# Patient Record
Sex: Female | Born: 1955
Health system: Southern US, Community
[De-identification: ages and names within clinical notes are randomized; demographics above are authoritative.]

## PROBLEM LIST (undated history)

## (undated) DIAGNOSIS — T8189XA Other complications of procedures, not elsewhere classified, initial encounter: Secondary | ICD-10-CM

## (undated) DIAGNOSIS — R239 Unspecified skin changes: Secondary | ICD-10-CM

## (undated) DIAGNOSIS — Z9889 Other specified postprocedural states: Secondary | ICD-10-CM

## (undated) DIAGNOSIS — I341 Nonrheumatic mitral (valve) prolapse: Secondary | ICD-10-CM

## (undated) DIAGNOSIS — R011 Cardiac murmur, unspecified: Secondary | ICD-10-CM

## (undated) DIAGNOSIS — G620 Drug-induced polyneuropathy: Secondary | ICD-10-CM

## (undated) DIAGNOSIS — C50911 Malignant neoplasm of unspecified site of right female breast: Secondary | ICD-10-CM

## (undated) DIAGNOSIS — Z853 Personal history of malignant neoplasm of breast: Secondary | ICD-10-CM

## (undated) DIAGNOSIS — Z7901 Long term (current) use of anticoagulants: Secondary | ICD-10-CM

## (undated) DIAGNOSIS — R112 Nausea with vomiting, unspecified: Secondary | ICD-10-CM

## (undated) DIAGNOSIS — T451X5A Adverse effect of antineoplastic and immunosuppressive drugs, initial encounter: Secondary | ICD-10-CM

## (undated) DIAGNOSIS — I749 Embolism and thrombosis of unspecified artery: Secondary | ICD-10-CM

## (undated) DIAGNOSIS — M1990 Unspecified osteoarthritis, unspecified site: Secondary | ICD-10-CM

## (undated) DIAGNOSIS — R768 Other specified abnormal immunological findings in serum: Secondary | ICD-10-CM

## (undated) DIAGNOSIS — Z95828 Presence of other vascular implants and grafts: Secondary | ICD-10-CM

## (undated) DIAGNOSIS — C792 Secondary malignant neoplasm of skin: Secondary | ICD-10-CM

## (undated) DIAGNOSIS — R234 Changes in skin texture: Secondary | ICD-10-CM

## (undated) HISTORY — PX: OTHER SURGICAL HISTORY: SHX169

---

## 2005-12-04 ENCOUNTER — Emergency Department (HOSPITAL_COMMUNITY): Admission: EM | Admit: 2005-12-04 | Discharge: 2005-12-04 | Payer: Self-pay | Admitting: Family Medicine

## 2007-04-13 ENCOUNTER — Encounter: Admission: RE | Admit: 2007-04-13 | Discharge: 2007-04-13 | Payer: Self-pay | Admitting: Internal Medicine

## 2007-05-07 ENCOUNTER — Other Ambulatory Visit: Admission: RE | Admit: 2007-05-07 | Discharge: 2007-05-07 | Payer: Self-pay | Admitting: Obstetrics and Gynecology

## 2007-05-27 LAB — HM MAMMOGRAPHY: HM Mammogram: ABNORMAL

## 2007-06-07 ENCOUNTER — Encounter (INDEPENDENT_AMBULATORY_CARE_PROVIDER_SITE_OTHER): Payer: Self-pay | Admitting: Diagnostic Radiology

## 2007-06-07 ENCOUNTER — Encounter: Admission: RE | Admit: 2007-06-07 | Discharge: 2007-06-07 | Payer: Self-pay | Admitting: Obstetrics and Gynecology

## 2007-06-10 ENCOUNTER — Ambulatory Visit: Payer: Self-pay | Admitting: Oncology

## 2007-06-15 ENCOUNTER — Ambulatory Visit (HOSPITAL_COMMUNITY): Admission: RE | Admit: 2007-06-15 | Discharge: 2007-06-15 | Payer: Self-pay | Admitting: General Surgery

## 2007-06-15 ENCOUNTER — Encounter: Admission: RE | Admit: 2007-06-15 | Discharge: 2007-06-15 | Payer: Self-pay | Admitting: Obstetrics and Gynecology

## 2007-06-16 LAB — CBC WITH DIFFERENTIAL/PLATELET
BASO%: 0.2 % (ref 0.0–2.0)
LYMPH%: 26.6 % (ref 14.0–48.0)
MCHC: 33.7 g/dL (ref 32.0–36.0)
MCV: 91 fL (ref 81.0–101.0)
MONO#: 0.3 10*3/uL (ref 0.1–0.9)
MONO%: 5.9 % (ref 0.0–13.0)
Platelets: 273 10*3/uL (ref 145–400)
RBC: 4.06 10*6/uL (ref 3.70–5.32)
WBC: 5.4 10*3/uL (ref 3.9–10.0)

## 2007-06-16 LAB — CANCER ANTIGEN 27.29: CA 27.29: 30 U/mL (ref 0–39)

## 2007-06-16 LAB — COMPREHENSIVE METABOLIC PANEL
ALT: 8 U/L (ref 0–35)
Alkaline Phosphatase: 39 U/L (ref 39–117)
Sodium: 139 mEq/L (ref 135–145)
Total Bilirubin: 0.6 mg/dL (ref 0.3–1.2)
Total Protein: 6.5 g/dL (ref 6.0–8.3)

## 2007-06-20 LAB — VITAMIN D PNL(25-HYDRXY+1,25-DIHY)-BLD: Vit D, 1,25-Dihydroxy: 55 pg/mL (ref 6–62)

## 2007-06-22 ENCOUNTER — Ambulatory Visit (HOSPITAL_COMMUNITY): Admission: RE | Admit: 2007-06-22 | Discharge: 2007-06-22 | Payer: Self-pay | Admitting: General Surgery

## 2007-06-22 HISTORY — PX: OTHER SURGICAL HISTORY: SHX169

## 2007-06-23 ENCOUNTER — Encounter: Payer: Self-pay | Admitting: Oncology

## 2007-06-23 ENCOUNTER — Ambulatory Visit: Payer: Self-pay

## 2007-06-24 ENCOUNTER — Ambulatory Visit (HOSPITAL_COMMUNITY): Admission: RE | Admit: 2007-06-24 | Discharge: 2007-06-24 | Payer: Self-pay | Admitting: Oncology

## 2007-06-28 LAB — CBC WITH DIFFERENTIAL/PLATELET
Basophils Absolute: 0 10*3/uL (ref 0.0–0.1)
HCT: 38.3 % (ref 34.8–46.6)
HGB: 13.1 g/dL (ref 11.6–15.9)
MONO#: 0.1 10*3/uL (ref 0.1–0.9)
NEUT#: 18.7 10*3/uL — ABNORMAL HIGH (ref 1.5–6.5)
NEUT%: 95.3 % — ABNORMAL HIGH (ref 39.6–76.8)
RDW: 13.3 % (ref 11.3–14.5)
WBC: 19.6 10*3/uL — ABNORMAL HIGH (ref 3.9–10.0)
lymph#: 0.8 10*3/uL — ABNORMAL LOW (ref 0.9–3.3)

## 2007-07-02 LAB — CBC WITH DIFFERENTIAL/PLATELET
Basophils Absolute: 0.3 10*3/uL — ABNORMAL HIGH (ref 0.0–0.1)
EOS%: 0.9 % (ref 0.0–7.0)
Eosinophils Absolute: 0.1 10*3/uL (ref 0.0–0.5)
HCT: 35.3 % (ref 34.8–46.6)
HGB: 12.5 g/dL (ref 11.6–15.9)
MCH: 31 pg (ref 26.0–34.0)
MCV: 87.8 fL (ref 81.0–101.0)
MONO%: 10 % (ref 0.0–13.0)
NEUT#: 10.5 10*3/uL — ABNORMAL HIGH (ref 1.5–6.5)
NEUT%: 74.1 % (ref 39.6–76.8)
lymph#: 1.8 10*3/uL (ref 0.9–3.3)

## 2007-07-09 LAB — COMPREHENSIVE METABOLIC PANEL
AST: 15 U/L (ref 0–37)
Albumin: 3.8 g/dL (ref 3.5–5.2)
Alkaline Phosphatase: 87 U/L (ref 39–117)
BUN: 15 mg/dL (ref 6–23)
Creatinine, Ser: 0.73 mg/dL (ref 0.40–1.20)
Glucose, Bld: 79 mg/dL (ref 70–99)
Potassium: 4.4 mEq/L (ref 3.5–5.3)
Total Bilirubin: 0.5 mg/dL (ref 0.3–1.2)

## 2007-07-09 LAB — CBC WITH DIFFERENTIAL/PLATELET
Basophils Absolute: 0.2 10*3/uL — ABNORMAL HIGH (ref 0.0–0.1)
EOS%: 0.4 % (ref 0.0–7.0)
Eosinophils Absolute: 0 10*3/uL (ref 0.0–0.5)
HGB: 12.1 g/dL (ref 11.6–15.9)
LYMPH%: 15.8 % (ref 14.0–48.0)
MCH: 31.1 pg (ref 26.0–34.0)
MCV: 88.2 fL (ref 81.0–101.0)
MONO%: 6.1 % (ref 0.0–13.0)
NEUT#: 6.5 10*3/uL (ref 1.5–6.5)
Platelets: 198 10*3/uL (ref 145–400)
RDW: 10.9 % — ABNORMAL LOW (ref 11.3–14.5)

## 2007-07-16 LAB — CBC WITH DIFFERENTIAL/PLATELET
BASO%: 0.8 % (ref 0.0–2.0)
LYMPH%: 5.3 % — ABNORMAL LOW (ref 14.0–48.0)
MCHC: 35.2 g/dL (ref 32.0–36.0)
MONO#: 0.2 10*3/uL (ref 0.1–0.9)
RBC: 3.61 10*6/uL — ABNORMAL LOW (ref 3.70–5.32)
lymph#: 0.6 10*3/uL — ABNORMAL LOW (ref 0.9–3.3)

## 2007-07-16 LAB — COMPREHENSIVE METABOLIC PANEL
ALT: 12 U/L (ref 0–35)
AST: 13 U/L (ref 0–37)
Alkaline Phosphatase: 59 U/L (ref 39–117)
Glucose, Bld: 123 mg/dL — ABNORMAL HIGH (ref 70–99)
Sodium: 137 mEq/L (ref 135–145)
Total Bilirubin: 0.7 mg/dL (ref 0.3–1.2)
Total Protein: 6.3 g/dL (ref 6.0–8.3)

## 2007-07-21 ENCOUNTER — Ambulatory Visit: Payer: Self-pay | Admitting: Oncology

## 2007-07-23 LAB — CBC WITH DIFFERENTIAL/PLATELET
Eosinophils Absolute: 0.4 10*3/uL (ref 0.0–0.5)
LYMPH%: 15.6 % (ref 14.0–48.0)
MCH: 31.4 pg (ref 26.0–34.0)
MCV: 89.8 fL (ref 81.0–101.0)
MONO%: 6.6 % (ref 0.0–13.0)
NEUT#: 8.2 10*3/uL — ABNORMAL HIGH (ref 1.5–6.5)
Platelets: 170 10*3/uL (ref 145–400)
RBC: 3.63 10*6/uL — ABNORMAL LOW (ref 3.70–5.32)

## 2007-07-30 LAB — CBC WITH DIFFERENTIAL/PLATELET
BASO%: 2 % (ref 0.0–2.0)
EOS%: 0.1 % (ref 0.0–7.0)
LYMPH%: 20.6 % (ref 14.0–48.0)
MCHC: 34.2 g/dL (ref 32.0–36.0)
MCV: 91.1 fL (ref 81.0–101.0)
MONO%: 4.9 % (ref 0.0–13.0)
Platelets: 172 10*3/uL (ref 145–400)
RBC: 3.54 10*6/uL — ABNORMAL LOW (ref 3.70–5.32)

## 2007-08-06 LAB — CBC WITH DIFFERENTIAL/PLATELET
Eosinophils Absolute: 0 10*3/uL (ref 0.0–0.5)
LYMPH%: 8.6 % — ABNORMAL LOW (ref 14.0–48.0)
MCHC: 34.1 g/dL (ref 32.0–36.0)
MCV: 92 fL (ref 81.0–101.0)
MONO%: 2.5 % (ref 0.0–13.0)
NEUT#: 7.7 10*3/uL — ABNORMAL HIGH (ref 1.5–6.5)
Platelets: 239 10*3/uL (ref 145–400)
RBC: 3.55 10*6/uL — ABNORMAL LOW (ref 3.70–5.32)

## 2007-08-06 LAB — COMPREHENSIVE METABOLIC PANEL
ALT: 15 U/L (ref 0–35)
AST: 16 U/L (ref 0–37)
Albumin: 4.5 g/dL (ref 3.5–5.2)
Alkaline Phosphatase: 71 U/L (ref 39–117)
BUN: 13 mg/dL (ref 6–23)
CO2: 23 mEq/L (ref 19–32)
Calcium: 8.7 mg/dL (ref 8.4–10.5)
Chloride: 101 mEq/L (ref 96–112)
Creatinine, Ser: 0.74 mg/dL (ref 0.40–1.20)
Glucose, Bld: 102 mg/dL — ABNORMAL HIGH (ref 70–99)
Potassium: 3.8 mEq/L (ref 3.5–5.3)
Sodium: 139 mEq/L (ref 135–145)
Total Bilirubin: 1.1 mg/dL (ref 0.3–1.2)
Total Protein: 6.9 g/dL (ref 6.0–8.3)

## 2007-08-13 LAB — CBC WITH DIFFERENTIAL/PLATELET
BASO%: 1.6 % (ref 0.0–2.0)
LYMPH%: 18.2 % (ref 14.0–48.0)
MCHC: 35 g/dL (ref 32.0–36.0)
MONO#: 0.9 10*3/uL (ref 0.1–0.9)
MONO%: 10.2 % (ref 0.0–13.0)
Platelets: 231 10*3/uL (ref 145–400)
RBC: 3.43 10*6/uL — ABNORMAL LOW (ref 3.70–5.32)
WBC: 9 10*3/uL (ref 3.9–10.0)

## 2007-08-13 LAB — URINALYSIS, MICROSCOPIC - CHCC
Glucose: NEGATIVE g/dL
Leukocyte Esterase: NEGATIVE
Nitrite: NEGATIVE

## 2007-08-15 LAB — URINE CULTURE

## 2007-08-24 ENCOUNTER — Ambulatory Visit (HOSPITAL_COMMUNITY): Admission: RE | Admit: 2007-08-24 | Discharge: 2007-08-24 | Payer: Self-pay | Admitting: Oncology

## 2007-08-27 LAB — COMPREHENSIVE METABOLIC PANEL
AST: 15 U/L (ref 0–37)
Albumin: 4.3 g/dL (ref 3.5–5.2)
Alkaline Phosphatase: 66 U/L (ref 39–117)
BUN: 11 mg/dL (ref 6–23)
Creatinine, Ser: 0.78 mg/dL (ref 0.40–1.20)
Glucose, Bld: 125 mg/dL — ABNORMAL HIGH (ref 70–99)

## 2007-08-27 LAB — CBC WITH DIFFERENTIAL/PLATELET
Basophils Absolute: 0 10*3/uL (ref 0.0–0.1)
EOS%: 0.2 % (ref 0.0–7.0)
Eosinophils Absolute: 0 10*3/uL (ref 0.0–0.5)
HCT: 31 % — ABNORMAL LOW (ref 34.8–46.6)
HGB: 10.7 g/dL — ABNORMAL LOW (ref 11.6–15.9)
MCH: 32.2 pg (ref 26.0–34.0)
MCV: 93.4 fL (ref 81.0–101.0)
MONO%: 2.4 % (ref 0.0–13.0)
NEUT#: 7.5 10*3/uL — ABNORMAL HIGH (ref 1.5–6.5)
NEUT%: 88.5 % — ABNORMAL HIGH (ref 39.6–76.8)
RDW: 15.2 % — ABNORMAL HIGH (ref 11.3–14.5)
lymph#: 0.7 10*3/uL — ABNORMAL LOW (ref 0.9–3.3)

## 2007-09-01 ENCOUNTER — Ambulatory Visit: Payer: Self-pay | Admitting: Oncology

## 2007-09-03 LAB — CBC WITH DIFFERENTIAL/PLATELET
Basophils Absolute: 0.2 10*3/uL — ABNORMAL HIGH (ref 0.0–0.1)
EOS%: 1.8 % (ref 0.0–7.0)
Eosinophils Absolute: 0.1 10*3/uL (ref 0.0–0.5)
HGB: 11.7 g/dL (ref 11.6–15.9)
LYMPH%: 20.1 % (ref 14.0–48.0)
MCH: 32.7 pg (ref 26.0–34.0)
MCV: 93.7 fL (ref 81.0–101.0)
MONO%: 11.2 % (ref 0.0–13.0)
NEUT#: 4.9 10*3/uL (ref 1.5–6.5)
Platelets: 200 10*3/uL (ref 145–400)

## 2007-09-10 LAB — CBC WITH DIFFERENTIAL/PLATELET
BASO%: 1.2 % (ref 0.0–2.0)
EOS%: 0.4 % (ref 0.0–7.0)
LYMPH%: 22.9 % (ref 14.0–48.0)
MCH: 32.3 pg (ref 26.0–34.0)
MCHC: 34.3 g/dL (ref 32.0–36.0)
MCV: 94.1 fL (ref 81.0–101.0)
MONO%: 6.5 % (ref 0.0–13.0)
Platelets: 193 10*3/uL (ref 145–400)
RBC: 3.13 10*6/uL — ABNORMAL LOW (ref 3.70–5.32)
RDW: 15.3 % — ABNORMAL HIGH (ref 11.3–14.5)

## 2007-09-15 LAB — CBC WITH DIFFERENTIAL/PLATELET
BASO%: 1.9 % (ref 0.0–2.0)
LYMPH%: 23.2 % (ref 14.0–48.0)
MCHC: 34.2 g/dL (ref 32.0–36.0)
MONO#: 0.6 10*3/uL (ref 0.1–0.9)
Platelets: 153 10*3/uL (ref 145–400)
RBC: 3.32 10*6/uL — ABNORMAL LOW (ref 3.70–5.32)
RDW: 15.8 % — ABNORMAL HIGH (ref 11.3–14.5)
WBC: 6.6 10*3/uL (ref 3.9–10.0)

## 2007-09-17 LAB — COMPREHENSIVE METABOLIC PANEL
ALT: 19 U/L (ref 0–35)
Alkaline Phosphatase: 65 U/L (ref 39–117)
CO2: 27 mEq/L (ref 19–32)
Potassium: 3.6 mEq/L (ref 3.5–5.3)
Sodium: 137 mEq/L (ref 135–145)
Total Bilirubin: 1.5 mg/dL — ABNORMAL HIGH (ref 0.3–1.2)
Total Protein: 6.4 g/dL (ref 6.0–8.3)

## 2007-09-22 ENCOUNTER — Ambulatory Visit: Payer: Self-pay

## 2007-09-22 ENCOUNTER — Encounter: Payer: Self-pay | Admitting: Oncology

## 2007-09-24 LAB — CBC WITH DIFFERENTIAL/PLATELET
BASO%: 2.1 % — ABNORMAL HIGH (ref 0.0–2.0)
Basophils Absolute: 0.1 10e3/uL (ref 0.0–0.1)
EOS%: 1.6 % (ref 0.0–7.0)
Eosinophils Absolute: 0.1 10e3/uL (ref 0.0–0.5)
HCT: 28.9 % — ABNORMAL LOW (ref 34.8–46.6)
HGB: 10.1 g/dL — ABNORMAL LOW (ref 11.6–15.9)
LYMPH%: 22.3 % (ref 14.0–48.0)
MCH: 33.6 pg (ref 26.0–34.0)
MCHC: 35 g/dL (ref 32.0–36.0)
MCV: 95.8 fL (ref 81.0–101.0)
MONO#: 0.6 10e3/uL (ref 0.1–0.9)
MONO%: 10.4 % (ref 0.0–13.0)
NEUT#: 3.5 10e3/uL (ref 1.5–6.5)
NEUT%: 63.6 % (ref 39.6–76.8)
Platelets: 116 10e3/uL — ABNORMAL LOW (ref 145–400)
RBC: 3.01 10e6/uL — ABNORMAL LOW (ref 3.70–5.32)
RDW: 14.5 % (ref 11.3–14.5)
WBC: 5.6 10e3/uL (ref 3.9–10.0)
lymph#: 1.2 10e3/uL (ref 0.9–3.3)

## 2007-09-29 ENCOUNTER — Ambulatory Visit (HOSPITAL_COMMUNITY): Admission: RE | Admit: 2007-09-29 | Discharge: 2007-09-29 | Payer: Self-pay | Admitting: Internal Medicine

## 2007-10-01 LAB — CBC WITH DIFFERENTIAL/PLATELET
BASO%: 1.4 % (ref 0.0–2.0)
Eosinophils Absolute: 0 10*3/uL (ref 0.0–0.5)
HCT: 30.3 % — ABNORMAL LOW (ref 34.8–46.6)
LYMPH%: 24.8 % (ref 14.0–48.0)
MCHC: 33.2 g/dL (ref 32.0–36.0)
MCV: 100 fL (ref 81.0–101.0)
MONO%: 7.3 % (ref 0.0–13.0)
NEUT%: 66.2 % (ref 39.6–76.8)
Platelets: 182 10*3/uL (ref 145–400)
RBC: 3.02 10*6/uL — ABNORMAL LOW (ref 3.70–5.32)

## 2007-10-08 LAB — CBC WITH DIFFERENTIAL/PLATELET
BASO%: 0.2 % (ref 0.0–2.0)
EOS%: 0 % (ref 0.0–7.0)
LYMPH%: 7.1 % — ABNORMAL LOW (ref 14.0–48.0)
MCH: 34.4 pg — ABNORMAL HIGH (ref 26.0–34.0)
MCHC: 33.8 g/dL (ref 32.0–36.0)
MONO#: 0.1 10*3/uL (ref 0.1–0.9)
MONO%: 1.9 % (ref 0.0–13.0)
NEUT%: 90.8 % — ABNORMAL HIGH (ref 39.6–76.8)
Platelets: 224 10*3/uL (ref 145–400)
RBC: 3.06 10*6/uL — ABNORMAL LOW (ref 3.70–5.32)
WBC: 6.6 10*3/uL (ref 3.9–10.0)

## 2007-10-08 LAB — COMPREHENSIVE METABOLIC PANEL
ALT: 16 U/L (ref 0–35)
AST: 18 U/L (ref 0–37)
Alkaline Phosphatase: 62 U/L (ref 39–117)
CO2: 25 mEq/L (ref 19–32)
Creatinine, Ser: 0.72 mg/dL (ref 0.40–1.20)
Sodium: 142 mEq/L (ref 135–145)
Total Bilirubin: 0.8 mg/dL (ref 0.3–1.2)
Total Protein: 6.8 g/dL (ref 6.0–8.3)

## 2007-10-13 ENCOUNTER — Ambulatory Visit: Payer: Self-pay | Admitting: Oncology

## 2007-10-22 LAB — CBC WITH DIFFERENTIAL/PLATELET
BASO%: 1.7 % (ref 0.0–2.0)
HCT: 29.1 % — ABNORMAL LOW (ref 34.8–46.6)
LYMPH%: 25.5 % (ref 14.0–48.0)
MCH: 34.9 pg — ABNORMAL HIGH (ref 26.0–34.0)
MCHC: 34.4 g/dL (ref 32.0–36.0)
MCV: 101 fL (ref 81.0–101.0)
MONO#: 0.5 10*3/uL (ref 0.1–0.9)
MONO%: 8.3 % (ref 0.0–13.0)
NEUT%: 64.3 % (ref 39.6–76.8)
Platelets: 169 10*3/uL (ref 145–400)
RBC: 2.87 10*6/uL — ABNORMAL LOW (ref 3.70–5.32)

## 2007-10-27 ENCOUNTER — Ambulatory Visit (HOSPITAL_COMMUNITY): Admission: RE | Admit: 2007-10-27 | Discharge: 2007-10-27 | Payer: Self-pay | Admitting: Oncology

## 2007-10-29 LAB — CBC WITH DIFFERENTIAL/PLATELET
Basophils Absolute: 0.1 10*3/uL (ref 0.0–0.1)
Eosinophils Absolute: 0.1 10*3/uL (ref 0.0–0.5)
HGB: 11.3 g/dL — ABNORMAL LOW (ref 11.6–15.9)
MONO#: 0.7 10*3/uL (ref 0.1–0.9)
MONO%: 10 % (ref 0.0–13.0)
NEUT#: 4.4 10*3/uL (ref 1.5–6.5)
RBC: 3.23 10*6/uL — ABNORMAL LOW (ref 3.70–5.32)
RDW: 14.6 % — ABNORMAL HIGH (ref 11.3–14.5)
WBC: 6.9 10*3/uL (ref 3.9–10.0)
lymph#: 1.6 10*3/uL (ref 0.9–3.3)

## 2007-11-05 LAB — CBC WITH DIFFERENTIAL/PLATELET
BASO%: 1.4 % (ref 0.0–2.0)
EOS%: 6.3 % (ref 0.0–7.0)
HCT: 31.8 % — ABNORMAL LOW (ref 34.8–46.6)
HGB: 10.9 g/dL — ABNORMAL LOW (ref 11.6–15.9)
MCH: 35.5 pg — ABNORMAL HIGH (ref 26.0–34.0)
MCHC: 34.2 g/dL (ref 32.0–36.0)
NEUT#: 2.7 10*3/uL (ref 1.5–6.5)
NEUT%: 60.1 % (ref 39.6–76.8)
RDW: 13.6 % (ref 11.3–14.5)
WBC: 4.5 10*3/uL (ref 3.9–10.0)
lymph#: 1 10*3/uL (ref 0.9–3.3)

## 2007-11-13 ENCOUNTER — Ambulatory Visit (HOSPITAL_COMMUNITY): Admission: RE | Admit: 2007-11-13 | Discharge: 2007-11-15 | Payer: Self-pay | Admitting: General Surgery

## 2007-11-13 ENCOUNTER — Encounter (INDEPENDENT_AMBULATORY_CARE_PROVIDER_SITE_OTHER): Payer: Self-pay | Admitting: General Surgery

## 2007-11-13 HISTORY — PX: OTHER SURGICAL HISTORY: SHX169

## 2007-11-22 LAB — COMPREHENSIVE METABOLIC PANEL
ALT: 30 U/L (ref 0–35)
AST: 29 U/L (ref 0–37)
Albumin: 3.8 g/dL (ref 3.5–5.2)
Alkaline Phosphatase: 52 U/L (ref 39–117)
Potassium: 4 mEq/L (ref 3.5–5.3)
Sodium: 140 mEq/L (ref 135–145)
Total Protein: 6.1 g/dL (ref 6.0–8.3)

## 2007-11-22 LAB — CBC WITH DIFFERENTIAL/PLATELET
BASO%: 1 % (ref 0.0–2.0)
EOS%: 7.2 % — ABNORMAL HIGH (ref 0.0–7.0)
Eosinophils Absolute: 0.3 10*3/uL (ref 0.0–0.5)
MCHC: 34.3 g/dL (ref 32.0–36.0)
MCV: 101 fL (ref 81.0–101.0)
MONO%: 8 % (ref 0.0–13.0)
NEUT#: 2.8 10*3/uL (ref 1.5–6.5)
RBC: 3.11 10*6/uL — ABNORMAL LOW (ref 3.70–5.32)
RDW: 12.3 % (ref 11.3–14.5)

## 2007-11-24 ENCOUNTER — Ambulatory Visit: Payer: Self-pay | Admitting: Oncology

## 2007-11-29 LAB — CBC WITH DIFFERENTIAL/PLATELET
BASO%: 1.5 % (ref 0.0–2.0)
Eosinophils Absolute: 0.3 10*3/uL (ref 0.0–0.5)
HCT: 33.9 % — ABNORMAL LOW (ref 34.8–46.6)
LYMPH%: 27 % (ref 14.0–48.0)
MCHC: 34.1 g/dL (ref 32.0–36.0)
MONO#: 0.4 10*3/uL (ref 0.1–0.9)
NEUT#: 3.3 10*3/uL (ref 1.5–6.5)
NEUT%: 58.3 % (ref 39.6–76.8)
Platelets: 252 10*3/uL (ref 145–400)
RBC: 3.3 10*6/uL — ABNORMAL LOW (ref 3.70–5.32)
WBC: 5.7 10*3/uL (ref 3.9–10.0)
lymph#: 1.5 10*3/uL (ref 0.9–3.3)

## 2007-11-30 ENCOUNTER — Ambulatory Visit: Admission: RE | Admit: 2007-11-30 | Discharge: 2008-02-08 | Payer: Self-pay | Admitting: Radiation Oncology

## 2007-12-06 LAB — CBC WITH DIFFERENTIAL/PLATELET
Basophils Absolute: 0.1 10*3/uL (ref 0.0–0.1)
HCT: 33.5 % — ABNORMAL LOW (ref 34.8–46.6)
HGB: 11.2 g/dL — ABNORMAL LOW (ref 11.6–15.9)
LYMPH%: 27.7 % (ref 14.0–48.0)
MONO#: 0.5 10*3/uL (ref 0.1–0.9)
NEUT%: 53.1 % (ref 39.6–76.8)
Platelets: 215 10*3/uL (ref 145–400)
WBC: 5.3 10*3/uL (ref 3.9–10.0)
lymph#: 1.5 10*3/uL (ref 0.9–3.3)

## 2007-12-06 LAB — COMPREHENSIVE METABOLIC PANEL
BUN: 13 mg/dL (ref 6–23)
CO2: 24 mEq/L (ref 19–32)
Calcium: 8.9 mg/dL (ref 8.4–10.5)
Chloride: 103 mEq/L (ref 96–112)
Creatinine, Ser: 0.72 mg/dL (ref 0.40–1.20)
Glucose, Bld: 85 mg/dL (ref 70–99)
Total Bilirubin: 0.9 mg/dL (ref 0.3–1.2)

## 2007-12-06 LAB — BRAIN NATRIURETIC PEPTIDE: Brain Natriuretic Peptide: 23 pg/mL (ref 0.0–100.0)

## 2007-12-13 LAB — CBC WITH DIFFERENTIAL/PLATELET
Basophils Absolute: 0.1 10*3/uL (ref 0.0–0.1)
EOS%: 8.4 % — ABNORMAL HIGH (ref 0.0–7.0)
Eosinophils Absolute: 0.3 10*3/uL (ref 0.0–0.5)
HCT: 34.4 % — ABNORMAL LOW (ref 34.8–46.6)
HGB: 11.8 g/dL (ref 11.6–15.9)
MCH: 34.3 pg — ABNORMAL HIGH (ref 26.0–34.0)
MONO#: 0.3 10*3/uL (ref 0.1–0.9)
NEUT#: 2.1 10*3/uL (ref 1.5–6.5)
RDW: 11.2 % — ABNORMAL LOW (ref 11.3–14.5)
WBC: 4 10*3/uL (ref 3.9–10.0)
lymph#: 1.2 10*3/uL (ref 0.9–3.3)

## 2007-12-20 LAB — CBC WITH DIFFERENTIAL/PLATELET
BASO%: 1.1 % (ref 0.0–2.0)
Basophils Absolute: 0.1 10*3/uL (ref 0.0–0.1)
EOS%: 5.7 % (ref 0.0–7.0)
HGB: 11.9 g/dL (ref 11.6–15.9)
MCH: 33.8 pg (ref 26.0–34.0)
MCHC: 34.1 g/dL (ref 32.0–36.0)
MCV: 99.1 fL (ref 81.0–101.0)
MONO%: 8.6 % (ref 0.0–13.0)
RBC: 3.53 10*6/uL — ABNORMAL LOW (ref 3.70–5.32)
RDW: 11 % — ABNORMAL LOW (ref 11.3–14.5)
lymph#: 1 10*3/uL (ref 0.9–3.3)

## 2007-12-21 ENCOUNTER — Ambulatory Visit: Payer: Self-pay

## 2007-12-21 ENCOUNTER — Encounter: Payer: Self-pay | Admitting: Oncology

## 2007-12-31 LAB — COMPREHENSIVE METABOLIC PANEL
Albumin: 4.3 g/dL (ref 3.5–5.2)
CO2: 25 mEq/L (ref 19–32)
Glucose, Bld: 85 mg/dL (ref 70–99)
Potassium: 3.9 mEq/L (ref 3.5–5.3)
Sodium: 139 mEq/L (ref 135–145)
Total Protein: 6.5 g/dL (ref 6.0–8.3)

## 2007-12-31 LAB — CBC WITH DIFFERENTIAL/PLATELET
Eosinophils Absolute: 0.2 10*3/uL (ref 0.0–0.5)
MONO#: 0.4 10*3/uL (ref 0.1–0.9)
NEUT#: 1.8 10*3/uL (ref 1.5–6.5)
RBC: 3.4 10*6/uL — ABNORMAL LOW (ref 3.70–5.32)
RDW: 11.3 % (ref 11.3–14.5)
WBC: 3.1 10*3/uL — ABNORMAL LOW (ref 3.9–10.0)
lymph#: 0.7 10*3/uL — ABNORMAL LOW (ref 0.9–3.3)

## 2008-01-07 LAB — CBC WITH DIFFERENTIAL/PLATELET
BASO%: 1 % (ref 0.0–2.0)
Eosinophils Absolute: 0.2 10*3/uL (ref 0.0–0.5)
MCHC: 33.9 g/dL (ref 32.0–36.0)
MCV: 97.7 fL (ref 81.0–101.0)
MONO#: 0.5 10*3/uL (ref 0.1–0.9)
MONO%: 11.4 % (ref 0.0–13.0)
NEUT#: 2.5 10*3/uL (ref 1.5–6.5)
RBC: 3.46 10*6/uL — ABNORMAL LOW (ref 3.70–5.32)
RDW: 10.9 % — ABNORMAL LOW (ref 11.3–14.5)
WBC: 3.9 10*3/uL (ref 3.9–10.0)

## 2008-01-07 LAB — BRAIN NATRIURETIC PEPTIDE: Brain Natriuretic Peptide: 32 pg/mL (ref 0.0–100.0)

## 2008-01-09 ENCOUNTER — Ambulatory Visit: Payer: Self-pay | Admitting: Oncology

## 2008-01-14 LAB — CBC WITH DIFFERENTIAL/PLATELET
BASO%: 1.4 % (ref 0.0–2.0)
EOS%: 4.8 % (ref 0.0–7.0)
LYMPH%: 12.8 % — ABNORMAL LOW (ref 14.0–48.0)
MCH: 32.7 pg (ref 26.0–34.0)
MCHC: 34.1 g/dL (ref 32.0–36.0)
MONO#: 0.4 10*3/uL (ref 0.1–0.9)
Platelets: 189 10*3/uL (ref 145–400)
RBC: 3.43 10*6/uL — ABNORMAL LOW (ref 3.70–5.32)
WBC: 3.6 10*3/uL — ABNORMAL LOW (ref 3.9–10.0)

## 2008-01-14 LAB — COMPREHENSIVE METABOLIC PANEL
ALT: 13 U/L (ref 0–35)
AST: 18 U/L (ref 0–37)
Alkaline Phosphatase: 46 U/L (ref 39–117)
CO2: 26 mEq/L (ref 19–32)
Sodium: 138 mEq/L (ref 135–145)
Total Bilirubin: 0.8 mg/dL (ref 0.3–1.2)
Total Protein: 6.6 g/dL (ref 6.0–8.3)

## 2008-01-21 LAB — CBC WITH DIFFERENTIAL/PLATELET
BASO%: 1.1 % (ref 0.0–2.0)
Basophils Absolute: 0 10*3/uL (ref 0.0–0.1)
EOS%: 4.9 % (ref 0.0–7.0)
HCT: 35.2 % (ref 34.8–46.6)
HGB: 12 g/dL (ref 11.6–15.9)
LYMPH%: 15.8 % (ref 14.0–48.0)
MCH: 32.9 pg (ref 26.0–34.0)
MCHC: 34.2 g/dL (ref 32.0–36.0)
MCV: 96.3 fL (ref 81.0–101.0)
NEUT%: 66.3 % (ref 39.6–76.8)
Platelets: 218 10*3/uL (ref 145–400)

## 2008-01-28 LAB — CBC WITH DIFFERENTIAL/PLATELET
BASO%: 1.2 % (ref 0.0–2.0)
EOS%: 4.3 % (ref 0.0–7.0)
MCH: 33 pg (ref 26.0–34.0)
MCHC: 34.4 g/dL (ref 32.0–36.0)
NEUT%: 70.2 % (ref 39.6–76.8)
RBC: 3.46 10*6/uL — ABNORMAL LOW (ref 3.70–5.32)
RDW: 11.2 % — ABNORMAL LOW (ref 11.3–14.5)
lymph#: 0.4 10*3/uL — ABNORMAL LOW (ref 0.9–3.3)

## 2008-01-28 LAB — COMPREHENSIVE METABOLIC PANEL
ALT: 11 U/L (ref 0–35)
AST: 15 U/L (ref 0–37)
Calcium: 9.2 mg/dL (ref 8.4–10.5)
Chloride: 106 mEq/L (ref 96–112)
Creatinine, Ser: 0.79 mg/dL (ref 0.40–1.20)
Potassium: 4 mEq/L (ref 3.5–5.3)

## 2008-02-17 LAB — CBC WITH DIFFERENTIAL/PLATELET
Basophils Absolute: 0 10*3/uL (ref 0.0–0.1)
EOS%: 1.7 % (ref 0.0–7.0)
HCT: 34 % — ABNORMAL LOW (ref 34.8–46.6)
HGB: 11.6 g/dL (ref 11.6–15.9)
LYMPH%: 13.7 % — ABNORMAL LOW (ref 14.0–48.0)
MCH: 33 pg (ref 26.0–34.0)
MCV: 96.4 fL (ref 81.0–101.0)
MONO%: 14.8 % — ABNORMAL HIGH (ref 0.0–13.0)
NEUT%: 69.3 % (ref 39.6–76.8)
RDW: 13.1 % (ref 11.3–14.5)

## 2008-02-17 LAB — URINALYSIS, MICROSCOPIC - CHCC
Nitrite: NEGATIVE
Protein: NEGATIVE mg/dL
pH: 7.5 (ref 4.6–8.0)

## 2008-02-19 LAB — COMPREHENSIVE METABOLIC PANEL
CO2: 27 mEq/L (ref 19–32)
Calcium: 8.9 mg/dL (ref 8.4–10.5)
Creatinine, Ser: 0.86 mg/dL (ref 0.40–1.20)
Glucose, Bld: 88 mg/dL (ref 70–99)
Total Bilirubin: 0.7 mg/dL (ref 0.3–1.2)

## 2008-02-19 LAB — URINE CULTURE

## 2008-03-08 ENCOUNTER — Ambulatory Visit: Payer: Self-pay | Admitting: Oncology

## 2008-03-10 LAB — CBC WITH DIFFERENTIAL/PLATELET
BASO%: 1.6 % (ref 0.0–2.0)
LYMPH%: 14.6 % (ref 14.0–48.0)
MCH: 32.1 pg (ref 26.0–34.0)
MCHC: 34.1 g/dL (ref 32.0–36.0)
MCV: 94.2 fL (ref 81.0–101.0)
MONO%: 7.4 % (ref 0.0–13.0)
Platelets: 207 10*3/uL (ref 145–400)
RBC: 3.6 10*6/uL — ABNORMAL LOW (ref 3.70–5.32)

## 2008-03-23 ENCOUNTER — Ambulatory Visit: Payer: Self-pay

## 2008-03-23 ENCOUNTER — Encounter: Payer: Self-pay | Admitting: Oncology

## 2008-03-26 DIAGNOSIS — F411 Generalized anxiety disorder: Secondary | ICD-10-CM | POA: Insufficient documentation

## 2008-03-31 LAB — COMPREHENSIVE METABOLIC PANEL
ALT: 15 U/L (ref 0–35)
Albumin: 4.3 g/dL (ref 3.5–5.2)
CO2: 27 mEq/L (ref 19–32)
Chloride: 102 mEq/L (ref 96–112)
Potassium: 4.2 mEq/L (ref 3.5–5.3)
Sodium: 140 mEq/L (ref 135–145)
Total Bilirubin: 1 mg/dL (ref 0.3–1.2)
Total Protein: 6.9 g/dL (ref 6.0–8.3)

## 2008-03-31 LAB — CBC WITH DIFFERENTIAL/PLATELET
BASO%: 1.9 % (ref 0.0–2.0)
Eosinophils Absolute: 0.1 10*3/uL (ref 0.0–0.5)
LYMPH%: 21.1 % (ref 14.0–48.0)
MCHC: 33.7 g/dL (ref 32.0–36.0)
MONO#: 0.3 10*3/uL (ref 0.1–0.9)
NEUT#: 2.3 10*3/uL (ref 1.5–6.5)
RBC: 3.56 10*6/uL — ABNORMAL LOW (ref 3.70–5.32)
RDW: 12.8 % (ref 11.3–14.5)
WBC: 3.5 10*3/uL — ABNORMAL LOW (ref 3.9–10.0)
lymph#: 0.7 10*3/uL — ABNORMAL LOW (ref 0.9–3.3)

## 2008-04-21 ENCOUNTER — Ambulatory Visit: Payer: Self-pay | Admitting: Oncology

## 2008-04-21 ENCOUNTER — Ambulatory Visit (HOSPITAL_COMMUNITY): Admission: RE | Admit: 2008-04-21 | Discharge: 2008-04-21 | Payer: Self-pay | Admitting: Oncology

## 2008-04-21 LAB — COMPREHENSIVE METABOLIC PANEL
ALT: 16 U/L (ref 0–35)
Alkaline Phosphatase: 59 U/L (ref 39–117)
Sodium: 140 mEq/L (ref 135–145)
Total Bilirubin: 1.2 mg/dL (ref 0.3–1.2)
Total Protein: 6.7 g/dL (ref 6.0–8.3)

## 2008-04-21 LAB — CBC WITH DIFFERENTIAL/PLATELET
BASO%: 2.9 % — ABNORMAL HIGH (ref 0.0–2.0)
LYMPH%: 17.2 % (ref 14.0–48.0)
MCHC: 34.4 g/dL (ref 32.0–36.0)
MONO#: 0.3 10*3/uL (ref 0.1–0.9)
MONO%: 7.5 % (ref 0.0–13.0)
Platelets: 224 10*3/uL (ref 145–400)
RBC: 3.87 10*6/uL (ref 3.70–5.32)
RDW: 12.2 % (ref 11.3–14.5)
WBC: 4.2 10*3/uL (ref 3.9–10.0)

## 2008-04-28 ENCOUNTER — Ambulatory Visit (HOSPITAL_COMMUNITY): Admission: RE | Admit: 2008-04-28 | Discharge: 2008-04-28 | Payer: Self-pay | Admitting: Radiation Oncology

## 2008-05-12 LAB — CBC WITH DIFFERENTIAL/PLATELET
Basophils Absolute: 0 10*3/uL (ref 0.0–0.1)
EOS%: 2 % (ref 0.0–7.0)
Eosinophils Absolute: 0.1 10*3/uL (ref 0.0–0.5)
LYMPH%: 18.5 % (ref 14.0–48.0)
MCH: 32.2 pg (ref 26.0–34.0)
MCV: 94.5 fL (ref 81.0–101.0)
MONO%: 8.7 % (ref 0.0–13.0)
Platelets: 229 10*3/uL (ref 145–400)
RBC: 3.87 10*6/uL (ref 3.70–5.32)
RDW: 11.9 % (ref 11.3–14.5)

## 2008-05-12 LAB — COMPREHENSIVE METABOLIC PANEL
AST: 16 U/L (ref 0–37)
Albumin: 4.3 g/dL (ref 3.5–5.2)
Alkaline Phosphatase: 55 U/L (ref 39–117)
BUN: 17 mg/dL (ref 6–23)
Glucose, Bld: 111 mg/dL — ABNORMAL HIGH (ref 70–99)
Potassium: 3.8 mEq/L (ref 3.5–5.3)
Sodium: 141 mEq/L (ref 135–145)
Total Bilirubin: 0.9 mg/dL (ref 0.3–1.2)

## 2008-05-30 ENCOUNTER — Ambulatory Visit (HOSPITAL_COMMUNITY): Admission: RE | Admit: 2008-05-30 | Discharge: 2008-05-30 | Payer: Self-pay | Admitting: Oncology

## 2008-06-02 LAB — COMPREHENSIVE METABOLIC PANEL
AST: 21 U/L (ref 0–37)
Alkaline Phosphatase: 60 U/L (ref 39–117)
BUN: 15 mg/dL (ref 6–23)
Creatinine, Ser: 0.72 mg/dL (ref 0.40–1.20)
Potassium: 4.3 mEq/L (ref 3.5–5.3)
Total Bilirubin: 0.9 mg/dL (ref 0.3–1.2)

## 2008-06-02 LAB — CBC WITH DIFFERENTIAL/PLATELET
Basophils Absolute: 0 10*3/uL (ref 0.0–0.1)
EOS%: 3.2 % (ref 0.0–7.0)
Eosinophils Absolute: 0.1 10*3/uL (ref 0.0–0.5)
HCT: 33.8 % — ABNORMAL LOW (ref 34.8–46.6)
HGB: 11.7 g/dL (ref 11.6–15.9)
MCH: 32.7 pg (ref 26.0–34.0)
MCV: 94.8 fL (ref 81.0–101.0)
MONO%: 11.8 % (ref 0.0–13.0)
NEUT#: 1.7 10*3/uL (ref 1.5–6.5)
NEUT%: 62.1 % (ref 39.6–76.8)
Platelets: 194 10*3/uL (ref 145–400)

## 2008-06-14 ENCOUNTER — Ambulatory Visit: Admission: RE | Admit: 2008-06-14 | Discharge: 2008-09-12 | Payer: Self-pay | Admitting: Radiation Oncology

## 2008-08-02 ENCOUNTER — Ambulatory Visit: Payer: Self-pay | Admitting: Oncology

## 2009-01-17 ENCOUNTER — Encounter: Payer: Self-pay | Admitting: Internal Medicine

## 2009-01-17 ENCOUNTER — Ambulatory Visit: Payer: Self-pay

## 2009-05-27 ENCOUNTER — Emergency Department (HOSPITAL_COMMUNITY): Admission: EM | Admit: 2009-05-27 | Discharge: 2009-05-27 | Payer: Self-pay | Admitting: Family Medicine

## 2009-08-24 LAB — HM PAP SMEAR: HM Pap smear: NORMAL

## 2009-08-27 ENCOUNTER — Ambulatory Visit: Payer: Self-pay

## 2009-08-27 ENCOUNTER — Encounter: Payer: Self-pay | Admitting: Internal Medicine

## 2009-08-27 ENCOUNTER — Ambulatory Visit (HOSPITAL_COMMUNITY): Admission: RE | Admit: 2009-08-27 | Discharge: 2009-08-27 | Payer: Self-pay | Admitting: Internal Medicine

## 2009-08-27 ENCOUNTER — Ambulatory Visit: Payer: Self-pay | Admitting: Internal Medicine

## 2009-12-24 ENCOUNTER — Ambulatory Visit: Payer: Self-pay | Admitting: Internal Medicine

## 2009-12-27 LAB — CONVERTED CEMR LAB
Alkaline Phosphatase: 64 units/L (ref 39–117)
Calcium: 9 mg/dL (ref 8.4–10.5)
Creatinine, Ser: 0.6 mg/dL (ref 0.4–1.2)
GFR calc non Af Amer: 108.8 mL/min (ref >60)
Glucose, Bld: 88 mg/dL (ref 70–99)
Potassium: 4 meq/L (ref 3.5–5.1)
Sodium: 140 meq/L (ref 135–145)
Total Bilirubin: 1.7 mg/dL — ABNORMAL HIGH (ref 0.3–1.2)
Total Protein: 6.2 g/dL (ref 6.0–8.3)

## 2010-03-04 ENCOUNTER — Encounter: Payer: Self-pay | Admitting: Internal Medicine

## 2010-03-04 ENCOUNTER — Ambulatory Visit: Payer: Self-pay

## 2010-03-04 ENCOUNTER — Ambulatory Visit: Payer: Self-pay | Admitting: Internal Medicine

## 2010-03-04 ENCOUNTER — Ambulatory Visit (HOSPITAL_COMMUNITY): Admission: RE | Admit: 2010-03-04 | Discharge: 2010-03-04 | Payer: Self-pay | Admitting: Internal Medicine

## 2010-06-16 ENCOUNTER — Encounter: Payer: Self-pay | Admitting: Oncology

## 2010-08-11 LAB — URINE CULTURE: Colony Count: 25000

## 2010-08-11 LAB — POCT URINALYSIS DIP (DEVICE)
Ketones, ur: NEGATIVE mg/dL
Nitrite: NEGATIVE
Urobilinogen, UA: 0.2 mg/dL (ref 0.0–1.0)

## 2010-08-23 ENCOUNTER — Other Ambulatory Visit (HOSPITAL_COMMUNITY): Payer: Self-pay | Admitting: Oncology

## 2010-08-23 DIAGNOSIS — I428 Other cardiomyopathies: Secondary | ICD-10-CM

## 2010-08-23 DIAGNOSIS — C50919 Malignant neoplasm of unspecified site of unspecified female breast: Secondary | ICD-10-CM

## 2010-08-26 ENCOUNTER — Ambulatory Visit (HOSPITAL_COMMUNITY): Payer: 59 | Attending: Internal Medicine | Admitting: Radiology

## 2010-08-26 DIAGNOSIS — I428 Other cardiomyopathies: Secondary | ICD-10-CM | POA: Insufficient documentation

## 2010-08-26 DIAGNOSIS — C50919 Malignant neoplasm of unspecified site of unspecified female breast: Secondary | ICD-10-CM

## 2010-08-26 DIAGNOSIS — Z5111 Encounter for antineoplastic chemotherapy: Secondary | ICD-10-CM

## 2010-09-09 LAB — GLUCOSE, CAPILLARY: Glucose-Capillary: 96 mg/dL (ref 70–99)

## 2010-10-04 ENCOUNTER — Ambulatory Visit: Payer: 59 | Admitting: Internal Medicine

## 2010-10-08 ENCOUNTER — Ambulatory Visit: Payer: 59 | Admitting: Internal Medicine

## 2010-10-08 NOTE — Op Note (Signed)
[name], [name] NO.:  [id number]   MEDICAL RECORD NO.:  [id number]          PATIENT TYPE:  OIB   LOCATION:  5158                         FACILITY:  [facility]   PHYSICIAN:  Angelia Mould. [name], M.D.DATE OF BIRTH:  1955/06/06   DATE OF PROCEDURE:  11/13/2007  DATE OF DISCHARGE:                               OPERATIVE REPORT   PREOPERATIVE DIAGNOSES:  1. 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  2. Desires prophylactic right mastectomy.   POSTOPERATIVE DIAGNOSES:  1. 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  2. Desires prophylactic right mastectomy.   OPERATIONS PERFORMED:  1. Left modified radical mastectomy.  2. Right total mastectomy.   SURGEON:  Angelia Mould. Laura Lolling, MD   OPERATIVE INDICATIONS:  This is a 55 year old white female who presented  in January 2009 with a large mass in her left breast and nipple  inversion.  A core biopsy of the area of density with  microcalcifications was performed and showed invasive mammary carcinoma.  Core biopsy of the largest of the left axillary lymph nodes was also  performed and revealed invasive ductal carcinoma.  On exam, I thought  that this was about 6 cm transversely x 3 cm vertically density.  She  had a PET/CT which suggested metastasis of the left axilla and possible  left internal mammary nodes.  She had a Port-A-Cath inserted and has  undergone neoadjuvant chemotherapy under the guidance of Dr. Pierce Crane.  The tumor has responded and is smaller.  She has some  lymphangiectasias on the skin of the left breast, but no obvious cancer  in the skin.  She has been counseled as an outpatient.  She desires  bilateral mastectomy and she requires left axillary lymph node  dissection.   OPERATIVE TECHNIQUE:  Following the induction of general endotracheal  anesthesia, intravenous antibiotics were  given.  The patient was  identified as correct procedure and correct patient and correct site.  Both breasts and chest walls and axilla were prepped and draped in a  sterile fashion.   With a marking pen, I marked the inframammary crease and the midline.  I  marked transverse elliptical incisions, incorporating the nipple areolar  complex.  On the left, I took as much skin as it was possible to obtain  primary closure.   I operated on the left breast first.  Transverse elliptical incision was  made.  Skin flaps were raised superiorly to just below the clavicle,  medially to the parasternal area, inferiorly to the anterior rectus  sheath, and laterally to latissimus dorsi muscle.  The left breast was  then dissected off the underlying pectoralis major and underlying  pectoralis minor muscle using electrocautery.  The clavipectoral fascia  was incised and the axilla was entered.  I took the dissection all the  way up to the axillary vein.  I identified the thoracodorsal  neurovascular bundle and that was preserved.  I identified the long  thoracic nerve  and it remained functional as well.  I removed all of the  axillary contents.  Smaller venous and nerve channels were controlled  with metal clips and divided.  I then dissected all of the axillary  contents out en bloc with the breast tissue.  Specimen was removed.  I  marked the upper outer quadrant of the skin with a silk suture and sent  this to the lab for routine histology.  This wound was irrigated with  saline multiple times.  Hemostasis was excellent and achieved with  electrocautery as well as metal clips.  Two 19-French Blake drains were  placed, one across the skin flaps and one up into the axilla and brought  out through separate stab incisions inferolaterally.  These were  sutured, the skin with nylon sutures.  The subcutaneous tissue and  dermis were closed with multiple interrupted sutures of 3-0 Vicryl and  skin was  closed with running subcuticular suture of 4-0 Monocryl and  Steri-Strips.   Attention was then turned to the right breast.  A  transverse elliptical  incision was made in the right breast, essentially a mirror image of the  left side.  Skin flaps were raised superiorly, medially, inferiorly, and  laterally in a similar fashion.  We dissected the breast off the  pectoralis major and minor muscles with electrocautery.  We dissected  all the breast tissue and the tails of Spence out, but we left the  axillary contents alone.  We marked the upper outer quadrant of the skin  incision with a silk suture and sent this specimen to the lab for  routine histology.  Hemostasis was excellent and achieved with  electrocautery.  The wound was copiously irrigated with saline.  Two 19-  [place] Blake drains were placed, one up into the axillary area and one  across the skin flaps.  These were brought through separate stab  incisions inferolaterally, sutured to the skin and connected to suction  bulbs.  The right mastectomy incision was also closed in 2 layers with  subcutaneous sutures of 3-0 Vicryl and a running subcuticular suture of  4-0 Monocryl and Steri-Strips.   Adaptic gauze, bulky bandage, and 6-inch Ace wraps were placed.  The  patient tolerated the procedure well and was taken to the recovery room  in stable condition.   ESTIMATED BLOOD LOSS:  About 200 mL.   COMPLICATIONS:  None.   SPONGE, NEEDLE, AND INSTRUMENT COUNTS:  Correct.      Angelia Mould. [name], M.D.  Electronically Signed     HMI/MEDQ  D:  11/13/2007  T:  11/13/2007  Job:  [id number]   cc:   Pierce Crane, MD  Deirdre Peer. Polite, M.D.

## 2010-10-08 NOTE — Op Note (Signed)
NAMEARABIA, NYLUND                  ACCOUNT NO.:  [id number]   MEDICAL RECORD NO.:  [id number]          PATIENT TYPE:  AMB   LOCATION:  DAY                          FACILITY[facility]  [facility]   PHYSICIAN:  Angelia Mould. Derrell Lolling, M.D.DATE OF BIRTH:  December 27, 1955   DATE OF PROCEDURE:  06/22/2007  DATE OF DISCHARGE:                               OPERATIVE REPORT   PREOPERATIVE DIAGNOSIS:  Cancer of the left breast.   POSTOPERATIVE DIAGNOSIS:  Cancer of the left breast.   OPERATION PERFORMED:  Insertion of PowerPort venous vascular access  device with fluoroscopic guidance and interpretation.   SURGEON:  Angelia Mould. Derrell Lolling, M.D.   OPERATIVE INDICATIONS:  This is a 55 year old white female who was  recently diagnosed with cancer of the left breast.  She has a large  tumor, at least 6 cm in transverse dimension, and by CT and PET scan,  she has involved left axillary and internal mammary nodes.  She is HER-  II-NEU positive.  Dr. Pierce Crane plans neoadjuvant chemotherapy prior  to definitive surgery.  Dr. Donnie Coffin has requested a port be placed.  The  patient has been counseled regarding indications and complications of  this procedure and is brought to operating room electively.   OPERATIVE TECHNIQUE:  The patient was brought to the operating room and  placed on the operating table with her arms at her sides.  She was  monitored and sedated by the anesthesia department.  The patient was  identified as the correct patient and the correct procedure.  The neck  and chest was prepped and draped in a sterile fashion.  Intravenous  antibiotics were given.  1% Xylocaine with epinephrine was used as local  infiltration anesthetic.  A right subclavian venipuncture was performed  without difficulty, a single stick was required, the guidewire was  inserted into the superior vena cava and confirmed under fluoroscopic  guidance and interpretation.  A small transverse incision was made at  the site.   A transverse  incision was made about 2.5 cm below the clavicle.  A  subcutaneous pocket was created at the level of the pectoralis fascia.  Using a tunneling device, we drew the catheter between the wire  insertion site and the port pocket site.  Once again, we used  fluoroscopic guidance to mark on the chest wall where the tip of the  catheter should be in the superior vena cava just at the right atrial  junction.  I then measured the catheter at an appropriate length and cut  the catheter.  We secured the catheter to the port with the locking  device and flushed the port and the catheter with heparinized saline.  The port was sutured to the pectoralis fascia with three interrupted  sutures of 2-0 Prolene.  We placed the patient back in Trendelenburg  position.  The dilator and peel away sheath was inserted over the  guidewire into the central venous circulation.  The guidewire and  dilator was removed.  The catheter was inserted and the peel away sheath  removed.  The catheter flushed easily and  had excellent blood return.  Fluoroscopy showed a lot of motion artifact and so we chose to take some  Omnipaque and inject the catheter and once we injected the catheter, we  could see clearly the tip of the catheter was in the superior vena cava  just above the right atrial junction in good position.  The heart was  somewhat hyperdynamic blurring the image. We flushed the port and the  catheter with concentrated heparin solution, 100 units per mL.  The  subcutaneous tissue was closed with  3-0 Vicryl sutures and the skin closed with subcuticular sutures of 4-0  Monocryl and Steri-Strips.  Clean bandages were placed and the patient  was taken to the recovery room in stable condition.  Estimated blood  loss was about 10 mL.  Complications none. Sponge, needle and instrument  counts were correct.      Angelia Mould. Derrell Lolling, M.D.  Electronically Signed     HMI/MEDQ  D:  06/22/2007  T:  06/22/2007  Job:   [id number]   cc:   Pierce Crane, MD  Fax: [phone]   Deirdre Peer. Polite, M.D.

## 2010-11-01 ENCOUNTER — Ambulatory Visit (INDEPENDENT_AMBULATORY_CARE_PROVIDER_SITE_OTHER): Payer: 59 | Admitting: Family

## 2010-11-01 ENCOUNTER — Ambulatory Visit: Payer: 59 | Admitting: Internal Medicine

## 2010-11-01 ENCOUNTER — Encounter: Payer: Self-pay | Admitting: Family

## 2010-11-01 DIAGNOSIS — J301 Allergic rhinitis due to pollen: Secondary | ICD-10-CM

## 2010-11-01 DIAGNOSIS — J309 Allergic rhinitis, unspecified: Secondary | ICD-10-CM

## 2010-11-01 DIAGNOSIS — C50919 Malignant neoplasm of unspecified site of unspecified female breast: Secondary | ICD-10-CM

## 2010-11-01 DIAGNOSIS — Z7901 Long term (current) use of anticoagulants: Secondary | ICD-10-CM

## 2010-11-01 NOTE — Progress Notes (Signed)
Subjective:    Patient ID: [name], female    DOB: 05-04-1956, 55 y.o.   MRN: [id number]  HPI  [name] is a 55 year old female who presents today to establish care.  Breast Cancer- diagnosed in 2009 HER-2. Dr. Dossie Der at [facility].  She had bilateral mastectomy 6/09 (after chemo).  Then had radiation.  She has metastatic disease.  She is taking xeloda/IV herceptin, Avastin, Lupatinub.  Feels tired.  [name] is causing hand /foot irritaiton.  She continues to work.    Hay fever- she uses zyrtec.  UTI- reports that she has had 2 UTI's   GYN- she is due for pap smear  Lovenox- She  reports that she has history of clot in her port.     Review of Systems  Constitutional: Negative for fever.  HENT: Positive for nosebleeds and congestion.   Eyes: Negative for visual disturbance.  Respiratory: Negative for shortness of breath.   Cardiovascular: Negative for leg swelling.  Gastrointestinal: Positive for diarrhea. Negative for nausea and vomiting.  Genitourinary: Negative for dysuria.  Musculoskeletal: Negative for myalgias and joint swelling.  Skin: Positive for rash.       Red rash/peeling on hands/feet  Neurological: Positive for numbness.       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 Does not bruise/bleed easily.  Psychiatric/Behavioral:       Denies depression or anxiety   Past Medical History  Diagnosis Date  . Breast cancer 06/2007  . Allergy   . Elevated blood pressure reading without diagnosis of hypertension     monitor due to Avastin treatments    History   Social History  . Marital Status: Married    Spouse Name: N/A    Number of Children: 3  . Years of Education: N/A   Occupational History  . 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 Smoking status: Never Smoker   . Smokeless tobacco: Never Used  . Alcohol Use: No  . Drug Use: No  . Sexually Active: Not on file   Other Topics  Concern  . 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    Past Surgical History  Procedure Date  . Breast surgery 2009    biposy. bilateral mastectomy. Breast cancer HER 2, chest wall. Mets 04/2008    Family History  Problem Relation Age of Onset  . Heart disease Mother     due to mitral valve regurgiation,   . Cancer Mother     breast  . Parkinsonism Father   . Stroke Sister   . Alcohol abuse Paternal Grandfather     Allergies  Allergen Reactions  . Penicillins Rash    No current outpatient prescriptions on file prior to visit.    BP 110/82  Pulse 88  Temp(Src) 98 F (36.7 C) (Oral)  Resp 16  Ht 5\' 8"  (1.727 m)  Wt 186 lb (84.369 kg)  BMI 28.28 kg/m2  LMP 06/27/2007        Objective:   Physical Exam  Constitutional: She appears well-developed and well-nourished.  HENT:  Head: Normocephalic and atraumatic.  Eyes: Pupils are equal, round, and reactive to light.  Cardiovascular: Normal rate and regular rhythm.   Pulmonary/Chest: Effort normal and breath sounds normal.  Genitourinary:  Breasts surgically absent.  Psychiatric: She has a normal mood and affect. Her behavior is normal. Judgment and thought content normal.          Assessment & Plan:  the

## 2010-11-01 NOTE — Patient Instructions (Signed)
Please follow up in 1 month for your physical. We will plan to complete your pap smear that day. Come fasting to this appointement.

## 2010-11-12 DIAGNOSIS — J301 Allergic rhinitis due to pollen: Secondary | ICD-10-CM | POA: Insufficient documentation

## 2010-11-12 DIAGNOSIS — C50919 Malignant neoplasm of unspecified site of unspecified female breast: Secondary | ICD-10-CM | POA: Insufficient documentation

## 2010-11-12 DIAGNOSIS — Z7901 Long term (current) use of anticoagulants: Secondary | ICD-10-CM | POA: Insufficient documentation

## 2010-11-12 NOTE — Assessment & Plan Note (Signed)
This is being managed by oncology. 

## 2010-11-12 NOTE — Assessment & Plan Note (Signed)
She is maintained on Lovenox therapy due 2 history of clot in her Port-A-Cath.

## 2010-11-12 NOTE — Assessment & Plan Note (Signed)
Stable on Zyrtec continue same.

## 2010-11-22 ENCOUNTER — Inpatient Hospital Stay (INDEPENDENT_AMBULATORY_CARE_PROVIDER_SITE_OTHER)
Admission: RE | Admit: 2010-11-22 | Discharge: 2010-11-22 | Disposition: A | Discharge status: Home / Self Care | Payer: 59 | Source: Ambulatory Visit | Attending: Emergency Medicine | Admitting: Emergency Medicine

## 2010-11-22 ENCOUNTER — Encounter: Payer: Self-pay | Admitting: Emergency Medicine

## 2010-11-22 DIAGNOSIS — N390 Urinary tract infection, site not specified: Secondary | ICD-10-CM

## 2010-11-22 LAB — CONVERTED CEMR LAB
Nitrite: NEGATIVE
Protein, U semiquant: NEGATIVE
Urobilinogen, UA: 0.2
pH: 5.5

## 2010-11-25 ENCOUNTER — Telehealth (INDEPENDENT_AMBULATORY_CARE_PROVIDER_SITE_OTHER): Payer: Self-pay | Admitting: *Deleted

## 2010-11-28 ENCOUNTER — Other Ambulatory Visit (HOSPITAL_COMMUNITY): Payer: 59 | Admitting: Radiology

## 2010-12-02 ENCOUNTER — Other Ambulatory Visit: Payer: Self-pay | Admitting: Internal Medicine

## 2010-12-02 ENCOUNTER — Ambulatory Visit (HOSPITAL_COMMUNITY): Payer: 59 | Attending: Internal Medicine | Admitting: Radiology

## 2010-12-02 DIAGNOSIS — C50919 Malignant neoplasm of unspecified site of unspecified female breast: Secondary | ICD-10-CM | POA: Insufficient documentation

## 2010-12-02 DIAGNOSIS — I428 Other cardiomyopathies: Secondary | ICD-10-CM | POA: Insufficient documentation

## 2010-12-02 DIAGNOSIS — C801 Malignant (primary) neoplasm, unspecified: Secondary | ICD-10-CM | POA: Insufficient documentation

## 2010-12-02 DIAGNOSIS — I379 Nonrheumatic pulmonary valve disorder, unspecified: Secondary | ICD-10-CM | POA: Insufficient documentation

## 2010-12-02 DIAGNOSIS — I059 Rheumatic mitral valve disease, unspecified: Secondary | ICD-10-CM | POA: Insufficient documentation

## 2010-12-02 DIAGNOSIS — I079 Rheumatic tricuspid valve disease, unspecified: Secondary | ICD-10-CM | POA: Insufficient documentation

## 2010-12-02 MED ORDER — PERFLUTREN PROTEIN A MICROSPH IV SUSP
2.0000 mL | Freq: Once | INTRAVENOUS | Status: AC
  Administered 2010-12-02: 2 mL via INTRAVENOUS

## 2010-12-18 ENCOUNTER — Ambulatory Visit
Admission: RE | Admit: 2010-12-18 | Discharge: 2010-12-18 | Disposition: A | Discharge status: Home / Self Care | Payer: 59 | Source: Ambulatory Visit | Attending: Family Medicine | Admitting: Family Medicine

## 2010-12-18 ENCOUNTER — Inpatient Hospital Stay (INDEPENDENT_AMBULATORY_CARE_PROVIDER_SITE_OTHER)
Admission: RE | Admit: 2010-12-18 | Discharge: 2010-12-18 | Disposition: A | Discharge status: Home / Self Care | Payer: 59 | Source: Ambulatory Visit | Attending: Family Medicine | Admitting: Family Medicine

## 2010-12-18 ENCOUNTER — Encounter: Payer: Self-pay | Admitting: Family Medicine

## 2010-12-18 ENCOUNTER — Other Ambulatory Visit: Payer: Self-pay | Admitting: Family Medicine

## 2010-12-18 DIAGNOSIS — M25569 Pain in unspecified knee: Secondary | ICD-10-CM

## 2010-12-18 DIAGNOSIS — M1710 Unilateral primary osteoarthritis, unspecified knee: Secondary | ICD-10-CM | POA: Insufficient documentation

## 2010-12-18 DIAGNOSIS — IMO0002 Reserved for concepts with insufficient information to code with codable children: Secondary | ICD-10-CM

## 2010-12-19 ENCOUNTER — Encounter (INDEPENDENT_AMBULATORY_CARE_PROVIDER_SITE_OTHER): Payer: Self-pay | Admitting: *Deleted

## 2011-01-07 ENCOUNTER — Other Ambulatory Visit (HOSPITAL_COMMUNITY): Payer: Self-pay | Admitting: Specialist

## 2011-01-07 DIAGNOSIS — M25562 Pain in left knee: Secondary | ICD-10-CM

## 2011-01-09 ENCOUNTER — Ambulatory Visit (HOSPITAL_COMMUNITY)
Admission: RE | Admit: 2011-01-09 | Discharge: 2011-01-09 | Disposition: A | Discharge status: Home / Self Care | Payer: 59 | Source: Ambulatory Visit | Attending: Specialist | Admitting: Specialist

## 2011-01-09 DIAGNOSIS — M1710 Unilateral primary osteoarthritis, unspecified knee: Secondary | ICD-10-CM | POA: Insufficient documentation

## 2011-01-09 DIAGNOSIS — M25562 Pain in left knee: Secondary | ICD-10-CM

## 2011-01-24 ENCOUNTER — Ambulatory Visit (INDEPENDENT_AMBULATORY_CARE_PROVIDER_SITE_OTHER): Payer: 59 | Admitting: Family

## 2011-01-24 ENCOUNTER — Encounter: Payer: Self-pay | Admitting: Family

## 2011-01-24 VITALS — BP 120/80 | HR 78 | Temp 97.8°F | Resp 16 | Ht 68.0 in | Wt 177.1 lb

## 2011-01-24 DIAGNOSIS — N390 Urinary tract infection, site not specified: Secondary | ICD-10-CM | POA: Insufficient documentation

## 2011-01-24 MED ORDER — CIPROFLOXACIN HCL 500 MG PO TABS: 500.0000 mg | ORAL_TABLET | Freq: Two times a day (BID) | ORAL | Status: DC

## 2011-01-24 NOTE — Assessment & Plan Note (Signed)
Unable to process dip due to Uristat use.  Will send urine for culture and plan to treat empirically with cipro.  She is instructed to contact us if bright blood, low back pain or fever >100.  She verbalizes understanding.

## 2011-01-24 NOTE — Progress Notes (Signed)
Subjective:    Patient ID: [name], female    DOB: 03-10-1956, 55 y.o.   MRN: [id number]  HPI  [name] is a 55 yr old female with history of metastatic breast cancer who presents today with chief complaint of dysuria.  Symptoms started this AM.  She took a uristat without improvement. Symptoms are worsening. Denies low back pain or fever.  + frequency or urgency and dribbling incontinence today.  Denies hematuria.   She reports that her chemotherapy pill causes her to have diarrhea every morning 1 hour after taking.  She thinks this is a contributing factor to her developing UTI's.   Review of Systems See HPI  Past Medical History  Diagnosis Date  . Breast cancer 06/2007  . Allergy   . Elevated blood pressure reading without diagnosis of hypertension     monitor due to Avastin treatments    History   Social History  . Marital Status: Married    Spouse Name: N/A    Number of Children: 3  . Years of Education: N/A   Occupational History  . 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 Smoking status: Never Smoker   . Smokeless tobacco: Never Used  . Alcohol Use: No  . Drug Use: No  . Sexually Active: Not on file   Other Topics Concern  . 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    Past Surgical History  Procedure Date  . Breast surgery 2009    biposy. bilateral mastectomy. Breast cancer HER 2, chest wall. Mets 04/2008    Family History  Problem Relation Age of Onset  . Heart disease Mother     due to mitral valve regurgiation,   . Cancer Mother     breast  . Parkinsonism Father   . Stroke Sister   . Alcohol abuse Paternal Grandfather     Allergies  Allergen Reactions  . Penicillins Rash    Current Outpatient Prescriptions on File Prior to Visit  Medication Sig Dispense Refill  . Bevacizumab (AVASTIN IV)  Inject into the vein. Every 3 weeks       . celecoxib (CELEBREX) 200 MG capsule Take 200 mg by mouth 2 (two) times daily.        . cetirizine (ZYRTEC) 10 MG tablet Take 10 mg by mouth daily.        . Enoxaparin Sodium (LOVENOX IJ) Inject 70 mcg as directed daily.        . eszopiclone (LUNESTA) 1 MG TABS Take 1 mg by mouth at bedtime. Take immediately before bedtime       . NON FORMULARY XELODA  1000mg  x 14 days, off 1 week       . omeprazole (PRILOSEC) 20 MG capsule Take 20 mg by mouth daily.        . Probiotic Product (PROBIOTIC FORMULA PO) Take 2-4 capsules by mouth daily.        . Trastuzumab (HERCEPTIN IV) Inject into the vein. Every 3 weeks         BP 120/80  Pulse 78  Temp(Src) 97.8 F (36.6 C) (Oral)  Resp 16  Ht 5\' 8"  (1.727 m)  Wt 177 lb 1.3 oz (80.323 kg)  BMI 26.92 kg/m2       Objective:   Physical Exam  Constitutional: She appears well-developed and well-nourished.  HENT:  Head: Normocephalic and atraumatic.  Cardiovascular: Normal rate and regular rhythm.   No murmur heard. Pulmonary/Chest: Breath sounds normal. No respiratory distress.  Abdominal: Soft. Bowel sounds are normal. She exhibits no distension.  Genitourinary:       Neg CVAT  Skin:       Skin is pink and mottled on hands from chemotherapy.  Psychiatric: She has a normal mood and affect. Her behavior is normal. Judgment and thought content normal.          Assessment & Plan:

## 2011-01-24 NOTE — Patient Instructions (Signed)
Call if you develop fever >100, blood in urine, low back pain or if your symptoms are not improved in 48-72 hours.

## 2011-01-27 LAB — URINE CULTURE: Colony Count: 15000

## 2011-01-28 ENCOUNTER — Telehealth: Payer: Self-pay | Admitting: *Deleted

## 2011-01-28 MED ORDER — FLUCONAZOLE 150 MG PO TABS: ORAL_TABLET | ORAL | Status: DC

## 2011-01-28 NOTE — Telephone Encounter (Signed)
Pt.notified

## 2011-01-28 NOTE — Telephone Encounter (Signed)
Received message from pt stating she is having vaginal itching since starting the antibiotic on Friday and is requesting Rx for Diflucan be sent to [facility]. Please advise.

## 2011-01-28 NOTE — Telephone Encounter (Signed)
Rx has been sent  

## 2011-02-20 LAB — DIFFERENTIAL
Basophils Absolute: 0
Basophils Relative: 1
Eosinophils Absolute: 0.3
Eosinophils Relative: 6 — ABNORMAL HIGH
Lymphocytes Relative: 28
Lymphs Abs: 1.2
Monocytes Absolute: 0.4
Monocytes Relative: 8
Neutro Abs: 2.5
Neutrophils Relative %: 57

## 2011-02-20 LAB — COMPREHENSIVE METABOLIC PANEL
ALT: 18
AST: 22
Albumin: 3.9
Alkaline Phosphatase: 54
BUN: 13
CO2: 28
Calcium: 9.1
Chloride: 100
Creatinine, Ser: 0.83
GFR calc Af Amer: >60
GFR calc non Af Amer: >60
Glucose, Bld: 89
Potassium: 3.9
Sodium: 136
Total Bilirubin: 1.5 — ABNORMAL HIGH
Total Protein: 6.1

## 2011-02-20 LAB — CBC
MCHC: 34.2
MCV: 102.8 — ABNORMAL HIGH
Platelets: 233

## 2011-02-20 LAB — URINALYSIS, ROUTINE W REFLEX MICROSCOPIC
Nitrite: NEGATIVE
Protein, ur: NEGATIVE
Specific Gravity, Urine: 1.003 — ABNORMAL LOW
Urobilinogen, UA: 0.2

## 2011-02-20 LAB — PROTIME-INR
INR: 0.9
Prothrombin Time: 12.7

## 2011-02-20 LAB — APTT: aPTT: 31

## 2011-03-19 ENCOUNTER — Ambulatory Visit: Payer: 59 | Admitting: Psychology

## 2011-04-28 NOTE — Progress Notes (Signed)
Summary: BLADDER SPASMS   Vital Signs:  Patient Profile:   55 Years Old Female CC:      Bladder spasms Height:     68 inches Weight:      188 pounds O2 Sat:      98 % O2 treatment:    Room Air Temp:     98.5 degrees F oral Pulse rate:   70 / minute Pulse rhythm:   regular Resp:     12 per minute BP sitting:   125 / 80  (left arm) Cuff size:   regular  Vitals Entered By: Emilio Math (November 22, 2010 8:39 AM)                  Current Allergies: ! PENICILLINHistory of Present Illness History from: patient Chief Complaint: Bladder spasms History of Present Illness: 55 Years Old Female complains of UTI symptoms for a few days.  She describes the pain as bladder spasms.  She has not used any OTC meds.  She had a UTI a few weeks ago that was + for E. coli and treated with Macrodantin. No dysuria + frequency ++ urgency No hematuria No vaginal discharge No fever/chills No lower abdomenal pain No back pain No fatigue   Current Meds XELODA 150 MG TABS (CAPECITABINE)  TYKERB 250 MG TABS (LAPATINIB DITOSYLATE)  CELEBREX 100 MG CAPS (CELECOXIB)  LUNESTA 1 MG TABS (ESZOPICLONE)  ZYRTEC HIVES RELIEF 10 MG TABS (CETIRIZINE HCL)  CIPROFLOXACIN HCL 250 MG TABS (CIPROFLOXACIN HCL) 1 by mouth two times a day for 7 days  REVIEW OF SYSTEMS Constitutional Symptoms      Denies fever, chills, night sweats, weight loss, weight gain, and fatigue.  Eyes       Denies change in vision, eye pain, eye discharge, glasses, contact lenses, and eye surgery. Ear/Nose/Throat/Mouth       Denies hearing loss/aids, change in hearing, ear pain, ear discharge, dizziness, frequent runny nose, frequent nose bleeds, sinus problems, sore throat, hoarseness, and tooth pain or bleeding.  Respiratory       Denies dry cough, productive cough, wheezing, shortness of breath, asthma, bronchitis, and emphysema/COPD.  Cardiovascular       Denies murmurs, chest pain, and tires easily with exhertion.     Gastrointestinal       Denies stomach pain, nausea/vomiting, diarrhea, constipation, blood in bowel movements, and indigestion. Genitourniary       Complains of painful urination.      Denies kidney stones and loss of urinary control. Neurological       Denies paralysis, seizures, and fainting/blackouts. Musculoskeletal       Denies muscle pain, joint pain, joint stiffness, decreased range of motion, redness, swelling, muscle weakness, and gout.  Skin       Denies bruising, unusual mles/lumps or sores, and hair/skin or nail changes.  Psych       Denies mood changes, temper/anger issues, anxiety/stress, speech problems, depression, and sleep problems.  Past History:  Past Medical History: Breast CA  Past Surgical History: Bilateral Mastectomy  Family History: Mother, D, CHF Father, Parkinson's  Social History: works in Passenger transport manager at [employer] Non smoker No Drugs No ETOH Physical Exam General appearance: well developed, well nourished, no acute distress Abdomen: soft, non-tender without obvious organomegaly Back: no CVA tenderness MSE: oriented to time, place, and person Assessment New Problems: URINARY TRACT INFECTION (ICD-599.0)   Plan New Medications/Changes: CIPROFLOXACIN HCL 250 MG TABS (CIPROFLOXACIN HCL) 1 by mouth two times a day for 7  days  #14 x 0, 11/22/2010, Hoyt Koch MD  New Orders: New Patient Level III [phone] T-Culture, Urine [[id number]] UA Dipstick w/o Micro (automated)  [81003] Planning Comments:   Hydration, rest, urine culture pending.  Follow-up with your primary care physician or urology if not improving or if getting worse.  IF recurrent symptoms, may want to talk to PCP + oncologist about estrogen creams.   The patient and/or caregiver has been counseled thoroughly with regard to medications prescribed including dosage, schedule, interactions, rationale for use, and possible side effects and they verbalize understanding.  Diagnoses and  expected course of recovery discussed and will return if not improved as expected or if the condition worsens. Patient and/or caregiver verbalized understanding.  Prescriptions: CIPROFLOXACIN HCL 250 MG TABS (CIPROFLOXACIN HCL) 1 by mouth two times a day for 7 days  #14 x 0   Entered and Authorized by:   Hoyt Koch MD   Signed by:   Hoyt Koch MD on 11/22/2010   Method used:   Print then Give to Patient   RxID:   [id number]   Orders Added: 1)  New Patient Level III [78469] 2)  T-Culture, Urine [[id number]] 3)  UA Dipstick w/o Micro (automated)  [81003]    Laboratory Results   Urine Tests  Date/Time Received: November 22, 2010 8:54 AM  Date/Time Reported: November 22, 2010 8:54 AM   Routine Urinalysis   Color: yellow Appearance: Clear Glucose: negative   (Normal Range: Negative) Bilirubin: negative   (Normal Range: Negative) Ketone: negative   (Normal Range: Negative) Spec. Gravity: <1.005   (Normal Range: 1.003-1.035) Blood: trace-intact   (Normal Range: Negative) pH: 5.5   (Normal Range: 5.0-8.0) Protein: negative   (Normal Range: Negative) Urobilinogen: 0.2   (Normal Range: 0-1) Nitrite: negative   (Normal Range: Negative) Leukocyte Esterace: trace   (Normal Range: Negative)

## 2011-04-28 NOTE — Telephone Encounter (Signed)
  Phone Note Outgoing Call Call back at [facility] Phone [phone]   Call placed by: Lajean Saver RN,  November 25, 2010 11:58 AM Call placed to: Patient Summary of Call: Callback: Patient is not home. Message left with family member to call clinic.      Appended Document:  Patient called back. Urine culture results given. Patient's symptoms are resolving.

## 2011-04-28 NOTE — Progress Notes (Signed)
Summary: LT KNEE PAIN Room 5   Vital Signs:  Patient Profile:   55 Years Old Female CC:      Rt knee pain x 3 days Height:     68 inches Weight:      188 pounds O2 Sat:      97 % O2 treatment:    Room Air Temp:     98.7 degrees F oral Pulse rate:   74 / minute Pulse rhythm:   regular Resp:     14 per minute BP sitting:   136 / 82  (left arm) Cuff size:   regular  Vitals Entered By: Emilio Math (December 18, 2010 9:26 AM)                  Current Allergies: ! PENICILLIN ! * ADHESIVE TAPEHistory of Present Illness Chief Complaint: Rt knee pain x 3 days History of Present Illness:  Subjective:  Patient was at [facility] 3 days ago for routine breast cancer follow-up, sitting most of the day and walking to appts.  She bagan to develop a mild ache in her left hip area and was able to stretch it out.  She then developed pain in her left anterior knee that has persisted.  The pain is just below the patella, worse when walking and extending the knee.  No significant swelling.  No sense of locking or giving way.  Somewhat improved with Motrin 400mg  three times a day.  Past history of left knee dislocation as a child with lateral release procedure.  Current Meds XELODA 150 MG TABS (CAPECITABINE)  TYKERB 250 MG TABS (LAPATINIB DITOSYLATE)  CELEBREX 100 MG CAPS (CELECOXIB)  LUNESTA 1 MG TABS (ESZOPICLONE)  ZYRTEC HIVES RELIEF 10 MG TABS (CETIRIZINE HCL)  VITAMIN D 400 UNIT CAPS (CHOLECALCIFEROL) 50,000 qwk x 8 doses HERCEPTIN 440 MG SOLR (TRASTUZUMAB)  LOVENOX 60 MG/0.6ML SOLN (ENOXAPARIN SODIUM)  AVASTIN 100 MG/4ML SOLN (BEVACIZUMAB)   REVIEW OF SYSTEMS Constitutional Symptoms      Denies fever, chills, night sweats, weight loss, weight gain, and fatigue.  Eyes       Denies change in vision, eye pain, eye discharge, glasses, contact lenses, and eye surgery. Ear/Nose/Throat/Mouth       Denies hearing loss/aids, change in hearing, ear pain, ear discharge,  dizziness, frequent runny nose, frequent nose bleeds, sinus problems, sore throat, hoarseness, and tooth pain or bleeding.  Respiratory       Denies dry cough, productive cough, wheezing, shortness of breath, asthma, bronchitis, and emphysema/COPD.  Cardiovascular       Denies murmurs, chest pain, and tires easily with exhertion.    Gastrointestinal       Denies stomach pain, nausea/vomiting, diarrhea, constipation, blood in bowel movements, and indigestion. Genitourniary       Denies painful urination, kidney stones, and loss of urinary control. Neurological       Denies paralysis, seizures, and fainting/blackouts. Musculoskeletal       Complains of joint pain, joint stiffness, and decreased range of motion.      Denies muscle pain, redness, swelling, muscle weakness, and gout.  Skin       Denies bruising, unusual mles/lumps or sores, and hair/skin or nail changes.  Psych       Denies mood changes, temper/anger issues, anxiety/stress, speech problems, depression, and sleep problems.  Past History:  Past Medical History: Reviewed history from 11/22/2010 and no changes required. Breast CA  Past Surgical History: Reviewed history from 11/22/2010 and  no changes required. Bilateral Mastectomy  Family History: Reviewed history from 11/22/2010 and no changes required. Mother, D, CHF Father, Parkinson's  Social History: Reviewed history from 11/22/2010 and no changes required. works in Passenger transport manager at [employer] Non smoker No Drugs No ETOH   Objective:  Appearance:  Patient appears healthy, stated age, and in no acute distress  Extremities:  Trace edema.  No leg length discrepancy by inspection Right knee:  No erythema or warmth.  + mild effusion.  Knee stable, negative drawer test.  McMurray test negative.  Knee has slightly decreased range of motion (difficulty with full extenson).  Mild tenderness over the medial joint line, medial/inferior edge of patella. X-ray left knee:   Advanced  tricompartmental degenerative changes for age.  No acute findings. Small to moderate joint effusion. Assessment New Problems: OSTEOARTHRITIS, KNEE, LEFT (ICD-715.96) PATELLO-FEMORAL SYNDROME (ICD-719.46) KNEE PAIN, LEFT, ACUTE (ICD-719.46)   Plan New Orders: T-DG Knee 2 Views*L* [73560] Knee Orthosis Elastic Knee Cap [L1825] Est. Patient Level III [[address]] Planning Comments:   Dispensed neoprene knee sleeve.  Begin applying ice pack several times daily.  Suggest using a cane or walker for several days.  Increase Ibuprofen 200mg , 4 tabs every 8 hours with food.  Begin range of motion exercises in several days ([facility] information and instruction patient handout given)  Followup with [facility] if not improved in two weeks.   The patient and/or caregiver has been counseled thoroughly with regard to medications prescribed including dosage, schedule, interactions, rationale for use, and possible side effects and they verbalize understanding.  Diagnoses and expected course of recovery discussed and will return if not improved as expected or if the condition worsens. Patient and/or caregiver verbalized understanding.   Orders Added: 1)  T-DG Knee 2 Views*L* [73560] 2)  Knee Orthosis Elastic Knee Cap [L1825] 3)  Est. Patient Level III [60454]

## 2011-04-28 NOTE — Letter (Signed)
Summary: Out of Work  [facility]  [address]   [place], [place] [address]   Phone: [phone]  Fax: [phone]    December 19, 2010   Employee:  Gershon Crane    To Whom It May Concern:   For Medical reasons, please excuse the above named employee from work for the following dates:  Start:   12/18/10  End:   12/19/10  May return 12/20/10  If you need additional information, please feel free to contact our office.         Sincerely,    Lajean Saver RN

## 2011-06-16 ENCOUNTER — Encounter: Payer: Self-pay | Admitting: Pharmacist

## 2011-06-16 ENCOUNTER — Ambulatory Visit (INDEPENDENT_AMBULATORY_CARE_PROVIDER_SITE_OTHER): Payer: Self-pay | Admitting: Pharmacist

## 2011-06-16 ENCOUNTER — Other Ambulatory Visit (HOSPITAL_COMMUNITY): Payer: Self-pay | Admitting: Internal Medicine

## 2011-06-16 VITALS — Ht 68.5 in | Wt 187.0 lb

## 2011-06-16 DIAGNOSIS — C50919 Malignant neoplasm of unspecified site of unspecified female breast: Secondary | ICD-10-CM

## 2011-06-16 DIAGNOSIS — F411 Generalized anxiety disorder: Secondary | ICD-10-CM

## 2011-06-16 DIAGNOSIS — I428 Other cardiomyopathies: Secondary | ICD-10-CM

## 2011-06-16 NOTE — Progress Notes (Signed)
  Subjective:    Patient ID: [name], female    DOB: Sep 14, 1955, 56 y.o.   MRN: [id number]  HPI Reviewed and agree with Dr. Macky Lower management    Review of Systems     Objective:   Physical Exam        Assessment & Plan:

## 2011-06-16 NOTE — Assessment & Plan Note (Signed)
Following medication review, no suggestions for change.  Complete medication list provided to patient.  Total time in face to face medication review: 25 minutes.  Patient seen with: Tamala Bari, PharmD Candidate and Albertine Grates, Pharmacy Resident

## 2011-06-16 NOTE — Progress Notes (Signed)
  Subjective:    Patient ID: [name], female    DOB: [birth year], 56 y.o.   MRN: [id number]  HPI Patient arrives in good spirits for medication review.  Reports seeing Dr. Amie Critchley ([facility]) as primary care provider/oncologist.  Patient reports being seen at [facility] for acute care and at that time saw Sandford Craze (NP).  Reports being diagnosed with breast cancer since 2009 and states she is currently under treatment.      Review of Systems     Objective:   Physical Exam        Assessment & Plan:  Following medication review, no suggestions for change.  Complete medication list provided to patient.  Total time in face to face medication review: 25 minutes.  Patient seen with: Tamala Bari, PharmD Candidate and Albertine Grates, Pharmacy Resident

## 2011-06-19 ENCOUNTER — Encounter: Payer: Self-pay | Admitting: Pharmacist

## 2011-06-19 DIAGNOSIS — C50919 Malignant neoplasm of unspecified site of unspecified female breast: Secondary | ICD-10-CM

## 2011-06-20 ENCOUNTER — Other Ambulatory Visit (HOSPITAL_COMMUNITY): Payer: 59 | Admitting: Radiology

## 2011-06-30 ENCOUNTER — Other Ambulatory Visit: Payer: Self-pay

## 2011-06-30 ENCOUNTER — Ambulatory Visit (HOSPITAL_COMMUNITY): Payer: 59 | Attending: Internal Medicine | Admitting: Radiology

## 2011-06-30 DIAGNOSIS — C50919 Malignant neoplasm of unspecified site of unspecified female breast: Secondary | ICD-10-CM

## 2011-06-30 DIAGNOSIS — Z79899 Other long term (current) drug therapy: Secondary | ICD-10-CM | POA: Insufficient documentation

## 2011-06-30 DIAGNOSIS — I428 Other cardiomyopathies: Secondary | ICD-10-CM | POA: Insufficient documentation

## 2011-07-08 ENCOUNTER — Encounter (HOSPITAL_COMMUNITY): Payer: Self-pay | Admitting: Internal Medicine

## 2011-07-08 NOTE — Progress Notes (Signed)
Patient ID: [name], female   DOB: [birth year], 57 y.o.   MRN: [id number]  Patient with known metastatic breast CA on Herceptin at [facility] x 4 years. I have been following her echos q3 months to monitor for Herceptin cardiotoxicity while on therapy.   Truman Hayward 4:35 PM

## 2011-07-25 ENCOUNTER — Encounter: Payer: Self-pay | Admitting: Internal Medicine

## 2011-07-25 ENCOUNTER — Ambulatory Visit (INDEPENDENT_AMBULATORY_CARE_PROVIDER_SITE_OTHER): Payer: 59 | Admitting: Internal Medicine

## 2011-07-25 DIAGNOSIS — T50901A Poisoning by unspecified drugs, medicaments and biological substances, accidental (unintentional), initial encounter: Secondary | ICD-10-CM

## 2011-07-25 DIAGNOSIS — T50904A Poisoning by unspecified drugs, medicaments and biological substances, undetermined, initial encounter: Secondary | ICD-10-CM

## 2011-07-25 DIAGNOSIS — F411 Generalized anxiety disorder: Secondary | ICD-10-CM

## 2011-07-25 DIAGNOSIS — G620 Drug-induced polyneuropathy: Secondary | ICD-10-CM

## 2011-07-25 DIAGNOSIS — T451X5A Adverse effect of antineoplastic and immunosuppressive drugs, initial encounter: Secondary | ICD-10-CM

## 2011-07-25 MED ORDER — GABAPENTIN 100 MG PO CAPS: ORAL_CAPSULE | ORAL | Status: DC

## 2011-07-25 NOTE — Assessment & Plan Note (Signed)
Patient tried lexapro in the past.  She has met with counselors in the past.  We discussed add low dose SSRI if her mood worsens.  Overall, she is coping remarkably well.  She is worried about how her husband is dealing with her illness.

## 2011-07-25 NOTE — Progress Notes (Signed)
Subjective:    Patient ID: [name], female    DOB: 08-Jul-1955, 56 y.o.   MRN: [id number]  HPI  56 year old white female with history of metastatic breast cancer to transfer care. She was previously seen by nurse practitioner in [facility]. She was diagnosed with breast cancer in 2009 and is followed by her oncologist at [facility]. She has a history of receptor negative but HER-2 positive. She is status post bilateral mastectomy and unfortunately had metastasis to the skin of her chest. She has a Port-A-Cath and is undergoing chemotherapy.  She complains of intermittent fatigue. 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 Patient also complains of intermittent joint pains. She currently takes Celebrex 200 mg twice daily. It is minimally effective.  She denies depressive symptoms. She started Ambien when she experienced difficulty sleeping secondary to noise of her heartbeat after starting chemotherapy.    Review of Systems  Constitutional: Negative for activity change, appetite change, no significant weight change Eyes: Negative for visual disturbance.  Respiratory: Negative for cough, chest tightness and shortness of breath.   Cardiovascular: Negative for chest pain.  Genitourinary: Negative for difficulty urinating.  Neurological: Negative for headaches.  Gastrointestinal: Negative for abdominal pain, heartburn melena or hematochezia Psych: Negative for depression or anxiety Endo:  No polyuria or polydypsia    Past Medical History  Diagnosis Date  . Breast cancer 06/2007  . Allergy   . Elevated blood pressure reading without diagnosis of hypertension     monitor due to Avastin treatments    History   Social History  . Marital Status: Married    Spouse Name: N/A    Number of Children: 3  . Years of Education: N/A   Occupational History  . 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 Smoking status: Never Smoker   . Smokeless tobacco:  Never Used  . Alcohol Use: No  . Drug Use: No  . Sexually Active: Not on file   Other Topics Concern  . 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    Past Surgical History  Procedure Date  . Breast surgery 2009    biposy. bilateral mastectomy. Breast cancer HER 2, chest wall. Mets 04/2008    Family History  Problem Relation Age of Onset  . Heart disease Mother     due to mitral valve regurgiation,   . Cancer Mother     breast  . Parkinsonism Father   . Stroke Sister   . Alcohol abuse Paternal Grandfather     Allergies  Allergen Reactions  . Tape Hives    Can tolerate paper tape  . Penicillins Rash    Denies airway involvement    Current Outpatient Prescriptions on File Prior to Visit  Medication Sig Dispense Refill  . celecoxib (CELEBREX) 200 MG capsule Take 200 mg by mouth 2 (two) times daily.        . cetirizine (ZYRTEC) 10 MG tablet Take 10 mg by mouth daily.        . Enoxaparin Sodium (LOVENOX IJ) Inject 70 mcg as directed daily.        . eriBULin mesylate (HALAVEN) 1 MG/2ML SOLN Inject 5.7 mLs (2.85 mg total) into the vein once. Weekly for Week 1 and Week 2, off Week 3, then continue cycle      .  lapatinib (TYKERB) 250 MG tablet Take 1,000 mg by mouth daily.        [name] LORazepam (ATIVAN) 0.5 MG tablet Take 1 tablet (0.5 mg total) by mouth daily as needed for anxiety (currently using less than 2x per week (06/16/11)).      [name] omeprazole (PRILOSEC) 20 MG capsule Take 20 mg by mouth daily as needed.       . ondansetron (ZOFRAN) 8 MG tablet Take 1 tablet (8 mg total) by mouth every 8 (eight) hours as needed for nausea.      . Trastuzumab (HERCEPTIN IV) Inject into the vein. Every 3 weeks         BP 134/84  Pulse 80  Temp(Src) 98.2 F (36.8 C) (Oral)  Ht 5\' 8"  (1.727 m)  Wt 186 lb (84.369 kg)  BMI 28.28 kg/m2      Objective:    Physical Exam  Constitutional: She is oriented to person, place, and time. She appears well-developed and well-nourished.  HENT:  Head: Normocephalic and atraumatic.       Generalized alopecia  Eyes: Conjunctivae are normal. Pupils are equal, round, and reactive to light.  Neck: Normal range of motion. Neck supple.  Cardiovascular: Normal rate, regular rhythm and normal heart sounds.   No murmur heard. Pulmonary/Chest: Effort normal and breath sounds normal. She has no wheezes. She has no rales.       Bilateral mastectomy.  Erythematous area across the entire left chest and neck.  Abdominal: Soft. Bowel sounds are normal.  Musculoskeletal: She exhibits no edema.  Lymphadenopathy:    She has no cervical adenopathy.  Neurological: She is alert and oriented to person, place, and time.  Skin: Skin is dry.  Psychiatric: She has a normal mood and affect. Her behavior is normal.          Assessment & Plan:

## 2011-07-25 NOTE — Assessment & Plan Note (Signed)
Unclear whether patient's somatic complaints are secondary to neuropathy versus arthralgias from chemo. Trial of low-dose gabapentin. Patient to start with a 100 mg at bedtime and slowly titrate to 200 mg. She understands potential sedative effects and will decrease her current dose of Ambien to 2.5 mg

## 2011-08-08 ENCOUNTER — Encounter: Payer: Self-pay | Admitting: Family

## 2011-08-08 ENCOUNTER — Ambulatory Visit (INDEPENDENT_AMBULATORY_CARE_PROVIDER_SITE_OTHER): Payer: 59 | Admitting: Family

## 2011-08-08 VITALS — BP 98/62 | Temp 98.7°F | Wt 186.0 lb

## 2011-08-08 DIAGNOSIS — N390 Urinary tract infection, site not specified: Secondary | ICD-10-CM

## 2011-08-08 DIAGNOSIS — R350 Frequency of micturition: Secondary | ICD-10-CM

## 2011-08-08 LAB — POCT URINALYSIS DIPSTICK
Bilirubin, UA: NEGATIVE
Nitrite, UA: NEGATIVE
Protein, UA: NEGATIVE
Urobilinogen, UA: 0.2
pH, UA: 6.5

## 2011-08-08 MED ORDER — CIPROFLOXACIN HCL 500 MG PO TABS: 500.0000 mg | ORAL_TABLET | Freq: Two times a day (BID) | ORAL | Status: AC

## 2011-08-08 NOTE — Progress Notes (Signed)
Subjective:    Patient ID: [name], female    DOB: April 20, 1956, 56 y.o.   MRN: [id number]  HPI A 56 year old white female, nonsmoker, patient of Dr. Marquis Lunch. is in today with complaints of urinary frequency, urgency, burning with urination and bladder spasms. She noticed her symptoms on Tuesday after her chemotherapy treatment. She is currently undergoing chemotherapy for breast cancer. She denies any fever, myalgias, lightheadedness, dizziness, chest pain, palpitations, shortness of breath or edema.   Review of Systems  Constitutional: Negative.   HENT: Negative.   Respiratory: Negative.   Cardiovascular: Negative.   Gastrointestinal: Negative.   Genitourinary: Positive for dysuria, urgency and hematuria.  Musculoskeletal: Negative.   Skin: Negative.   Hematological: Negative.   Psychiatric/Behavioral: Negative.    Past Medical History  Diagnosis Date  . Breast cancer 06/2007  . Allergy   . Elevated blood pressure reading without diagnosis of hypertension     monitor due to Avastin treatments    History   Social History  . Marital Status: Married    Spouse Name: N/A    Number of Children: 3  . Years of Education: N/A   Occupational History  . 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 Smoking status: Never Smoker   . Smokeless tobacco: Never Used  . Alcohol Use: No  . Drug Use: No  . Sexually Active: Not on file   Other Topics Concern  . Not on file   Social History Narrative   Caffeine use: noneRegular exercise:  NoMarried- lives with 81 year old daughter and her husbandWorks as an Charity fundraiser at the [employer] at [employer].    Past Surgical History  Procedure Date  . Breast surgery 2009    biposy. bilateral mastectomy. Breast cancer HER 2, chest wall. Mets 04/2008    Family History  Problem Relation Age of Onset  . Heart disease Mother     due to mitral valve regurgiation,   . Cancer Mother     breast  . Parkinsonism Father   . Stroke Sister   . Alcohol  abuse Paternal Grandfather     Allergies  Allergen Reactions  . Tape Hives    Can tolerate paper tape  . Penicillins Rash    Denies airway involvement    Current Outpatient Prescriptions on File Prior to Visit  Medication Sig Dispense Refill  . celecoxib (CELEBREX) 200 MG capsule Take 200 mg by mouth 2 (two) times daily.        . cetirizine (ZYRTEC) 10 MG tablet Take 10 mg by mouth daily.        [name] dexamethasone (DECADRON) 0.5 MG tablet Take 0.5 mg by mouth. 3 days weekly on chemo days      . Enoxaparin Sodium (LOVENOX IJ) Inject 70 mcg as directed daily.        . eriBULin mesylate (HALAVEN) 1 MG/2ML SOLN Inject 5.7 mLs (2.85 mg total) into the vein once. Weekly for Week 1 and Week 2, off Week 3, then continue cycle      . gabapentin (NEURONTIN) 100 MG capsule One to two caps in the evening as directed  60 capsule  1  . lapatinib (TYKERB) 250 MG tablet Take 1,000 mg by mouth daily.        [name] LORazepam (ATIVAN) 0.5 MG tablet Take 1 tablet (0.5 mg total) by mouth daily as needed for anxiety (currently using less than 2x per week (06/16/11)).      [name]  omeprazole (PRILOSEC) 20 MG capsule Take 20 mg by mouth daily as needed.       . ondansetron (ZOFRAN) 8 MG tablet Take 1 tablet (8 mg total) by mouth every 8 (eight) hours as needed for nausea.      . Trastuzumab (HERCEPTIN IV) Inject into the vein. Every 3 weeks       . zolpidem (AMBIEN) 5 MG tablet Take 5 mg by mouth at bedtime as needed.        BP 98/62  Temp(Src) 98.7 F (37.1 C) (Oral)  Wt 186 lb (84.369 kg)chart    Objective:   Physical Exam  Constitutional: She is oriented to person, place, and time. She appears well-developed and well-nourished.  Neck: Normal range of motion. Neck supple.  Cardiovascular: Normal rate, regular rhythm and normal heart sounds.   Pulmonary/Chest: Effort normal and breath sounds normal.  Abdominal: Soft. Bowel sounds are normal.  Musculoskeletal: Normal range of motion.  Neurological: She is alert and  oriented to person, place, and time.  Skin: Skin is dry.  Psychiatric: She has a normal mood and affect.          Assessment & Plan:  Assessment: Dysuria, urinary frequency, urinary tract infection  Plan: Cipro 500mg  one tablet twice a day for 7 days. Drink plenty of fluids. Rest. Patient only office if symptoms worsen or persist, recheck reschedule when necessary.

## 2011-08-08 NOTE — Patient Instructions (Signed)

## 2011-09-10 ENCOUNTER — Other Ambulatory Visit: Payer: Self-pay | Admitting: Internal Medicine

## 2011-09-10 ENCOUNTER — Other Ambulatory Visit (INDEPENDENT_AMBULATORY_CARE_PROVIDER_SITE_OTHER): Payer: 59

## 2011-09-10 ENCOUNTER — Telehealth: Payer: Self-pay | Admitting: *Deleted

## 2011-09-10 DIAGNOSIS — N390 Urinary tract infection, site not specified: Secondary | ICD-10-CM

## 2011-09-10 LAB — POCT URINALYSIS DIPSTICK
Blood, UA: NEGATIVE
Glucose, UA: NEGATIVE
Spec Grav, UA: 1.025
Urobilinogen, UA: 0.2
pH, UA: 5.5

## 2011-09-10 MED ORDER — CIPROFLOXACIN HCL 500 MG PO TABS: 500.0000 mg | ORAL_TABLET | Freq: Two times a day (BID) | ORAL | Status: AC

## 2011-09-10 NOTE — Telephone Encounter (Signed)
Pt is asking for Dr Artist Pais to treat her for an UTI.  Per Dr. Artist Pais, she needs to come by and get an UA and temp before giving her a prescription.  Pt agrees and will come now.

## 2011-09-12 LAB — URINE CULTURE: Colony Count: >=100000

## 2011-09-18 ENCOUNTER — Telehealth: Payer: Self-pay | Admitting: *Deleted

## 2011-09-18 NOTE — Telephone Encounter (Signed)
Patient is calling for results of a urine culture

## 2011-09-19 ENCOUNTER — Other Ambulatory Visit (INDEPENDENT_AMBULATORY_CARE_PROVIDER_SITE_OTHER): Payer: 59

## 2011-09-19 ENCOUNTER — Telehealth: Payer: Self-pay | Admitting: *Deleted

## 2011-09-19 DIAGNOSIS — R350 Frequency of micturition: Secondary | ICD-10-CM

## 2011-09-19 LAB — POCT URINALYSIS DIPSTICK
Glucose, UA: NEGATIVE
Nitrite, UA: NEGATIVE
Spec Grav, UA: 1.02
Urobilinogen, UA: 0.2

## 2011-09-19 MED ORDER — CEFUROXIME AXETIL 500 MG PO TABS: 500.0000 mg | ORAL_TABLET | Freq: Two times a day (BID) | ORAL | Status: AC

## 2011-09-19 NOTE — Telephone Encounter (Signed)
Pt called wanting to know urine culture results.  Told pt results and she said that she is still having urine freq and urgency, pressure, but no burning.  [facility]

## 2011-09-19 NOTE — Telephone Encounter (Signed)
Please ask pt to re submit another urine sample for UA and culture.  After she provides sample, call in cefuroxime 500 mg one po bid # [phone].  No RF.

## 2011-09-19 NOTE — Telephone Encounter (Signed)
rx ready for p/u, pt is coming to drop off sample and will rx when she comes in

## 2011-09-22 LAB — URINE CULTURE: Colony Count: >=100000

## 2011-10-22 ENCOUNTER — Other Ambulatory Visit: Payer: Self-pay | Admitting: Otolaryngology

## 2011-10-23 ENCOUNTER — Inpatient Hospital Stay (HOSPITAL_COMMUNITY): Payer: 59

## 2011-10-23 ENCOUNTER — Encounter (HOSPITAL_COMMUNITY): Payer: Self-pay

## 2011-10-23 ENCOUNTER — Emergency Department (HOSPITAL_COMMUNITY): Payer: 59

## 2011-10-23 ENCOUNTER — Inpatient Hospital Stay (HOSPITAL_COMMUNITY)
Admission: EM | Admit: 2011-10-23 | Discharge: 2011-10-24 | DRG: 690 | Disposition: A | Discharge status: Home / Self Care | Payer: 59 | Attending: Internal Medicine | Admitting: Internal Medicine

## 2011-10-23 DIAGNOSIS — E876 Hypokalemia: Secondary | ICD-10-CM | POA: Diagnosis not present

## 2011-10-23 DIAGNOSIS — K219 Gastro-esophageal reflux disease without esophagitis: Secondary | ICD-10-CM | POA: Diagnosis present

## 2011-10-23 DIAGNOSIS — N12 Tubulo-interstitial nephritis, not specified as acute or chronic: Principal | ICD-10-CM | POA: Diagnosis present

## 2011-10-23 DIAGNOSIS — F411 Generalized anxiety disorder: Secondary | ICD-10-CM | POA: Diagnosis present

## 2011-10-23 DIAGNOSIS — M25569 Pain in unspecified knee: Secondary | ICD-10-CM

## 2011-10-23 DIAGNOSIS — G620 Drug-induced polyneuropathy: Secondary | ICD-10-CM

## 2011-10-23 DIAGNOSIS — C50919 Malignant neoplasm of unspecified site of unspecified female breast: Secondary | ICD-10-CM | POA: Diagnosis present

## 2011-10-23 DIAGNOSIS — Z9010 Acquired absence of unspecified breast and nipple: Secondary | ICD-10-CM

## 2011-10-23 DIAGNOSIS — N390 Urinary tract infection, site not specified: Secondary | ICD-10-CM

## 2011-10-23 DIAGNOSIS — J069 Acute upper respiratory infection, unspecified: Secondary | ICD-10-CM

## 2011-10-23 DIAGNOSIS — R000 Tachycardia, unspecified: Secondary | ICD-10-CM | POA: Diagnosis present

## 2011-10-23 DIAGNOSIS — Z7901 Long term (current) use of anticoagulants: Secondary | ICD-10-CM

## 2011-10-23 DIAGNOSIS — Z9221 Personal history of antineoplastic chemotherapy: Secondary | ICD-10-CM

## 2011-10-23 DIAGNOSIS — D649 Anemia, unspecified: Secondary | ICD-10-CM | POA: Diagnosis present

## 2011-10-23 DIAGNOSIS — R509 Fever, unspecified: Secondary | ICD-10-CM | POA: Diagnosis present

## 2011-10-23 DIAGNOSIS — T451X5A Adverse effect of antineoplastic and immunosuppressive drugs, initial encounter: Secondary | ICD-10-CM

## 2011-10-23 DIAGNOSIS — M1710 Unilateral primary osteoarthritis, unspecified knee: Secondary | ICD-10-CM

## 2011-10-23 DIAGNOSIS — R5381 Other malaise: Secondary | ICD-10-CM

## 2011-10-23 DIAGNOSIS — J301 Allergic rhinitis due to pollen: Secondary | ICD-10-CM

## 2011-10-23 DIAGNOSIS — IMO0002 Reserved for concepts with insufficient information to code with codable children: Secondary | ICD-10-CM

## 2011-10-23 LAB — COMPREHENSIVE METABOLIC PANEL
Albumin: 3.4 g/dL — ABNORMAL LOW (ref 3.5–5.2)
Alkaline Phosphatase: 81 U/L (ref 39–117)
BUN: 12 mg/dL (ref 6–23)
Chloride: 96 mEq/L (ref 96–112)
Potassium: 3.6 mEq/L (ref 3.5–5.1)
Total Bilirubin: 0.6 mg/dL (ref 0.3–1.2)

## 2011-10-23 LAB — DIFFERENTIAL
Basophils Relative: 0 % (ref 0–1)
Lymphs Abs: 0.7 10*3/uL (ref 0.7–4.0)
Monocytes Relative: 7 % (ref 3–12)
Neutro Abs: 8.4 10*3/uL — ABNORMAL HIGH (ref 1.7–7.7)
Neutrophils Relative %: 86 % — ABNORMAL HIGH (ref 43–77)

## 2011-10-23 LAB — URINALYSIS, ROUTINE W REFLEX MICROSCOPIC
Bilirubin Urine: NEGATIVE
Glucose, UA: NEGATIVE mg/dL
Hgb urine dipstick: NEGATIVE
Ketones, ur: NEGATIVE mg/dL
Protein, ur: NEGATIVE mg/dL

## 2011-10-23 LAB — CBC
MCH: 31.1 pg (ref 26.0–34.0)
MCHC: 32.9 g/dL (ref 30.0–36.0)
Platelets: 183 10*3/uL (ref 150–400)
RBC: 3.92 MIL/uL (ref 3.87–5.11)

## 2011-10-23 LAB — POCT I-STAT, CHEM 8
BUN: 12 mg/dL (ref 6–23)
Chloride: 103 mEq/L (ref 96–112)
HCT: 38 % (ref 36.0–46.0)
Potassium: 3.8 mEq/L (ref 3.5–5.1)
Sodium: 136 mEq/L (ref 135–145)

## 2011-10-23 LAB — TSH: TSH: 1.583 u[IU]/mL (ref 0.350–4.500)

## 2011-10-23 LAB — URINE MICROSCOPIC-ADD ON

## 2011-10-23 MED ORDER — VITAMIN D3 25 MCG (1000 UNIT) PO TABS
2000.0000 [IU] | ORAL_TABLET | ORAL | Status: DC
  Administered 2011-10-24: 2000 [IU] via ORAL
  Filled 2011-10-23: qty 2

## 2011-10-23 MED ORDER — ALBUTEROL SULFATE HFA 108 (90 BASE) MCG/ACT IN AERS
2.0000 | INHALATION_SPRAY | Freq: Once | RESPIRATORY_TRACT | Status: AC
  Administered 2011-10-23: 2 via RESPIRATORY_TRACT
  Filled 2011-10-23: qty 6.7

## 2011-10-23 MED ORDER — ENOXAPARIN SODIUM 80 MG/0.8ML ~~LOC~~ SOLN
70.0000 mg | Freq: Every day | SUBCUTANEOUS | Status: DC
  Administered 2011-10-23 – 2011-10-24 (×2): 70 mg via SUBCUTANEOUS
  Filled 2011-10-23 (×2): qty 0.8

## 2011-10-23 MED ORDER — ENOXAPARIN SODIUM 300 MG/3ML IJ SOLN: 70.0000 mg | INTRAMUSCULAR | Status: DC

## 2011-10-23 MED ORDER — SODIUM CHLORIDE 0.9 % IV BOLUS (SEPSIS)
500.0000 mL | Freq: Once | INTRAVENOUS | Status: AC
  Administered 2011-10-23: 500 mL via INTRAVENOUS

## 2011-10-23 MED ORDER — ONDANSETRON HCL 4 MG PO TABS: 4.0000 mg | ORAL_TABLET | Freq: Four times a day (QID) | ORAL | Status: DC | PRN

## 2011-10-23 MED ORDER — PANTOPRAZOLE SODIUM 40 MG PO TBEC
40.0000 mg | DELAYED_RELEASE_TABLET | Freq: Every day | ORAL | Status: DC
  Administered 2011-10-23 – 2011-10-24 (×2): 40 mg via ORAL
  Filled 2011-10-23 (×2): qty 1

## 2011-10-23 MED ORDER — ACETAMINOPHEN 650 MG RE SUPP: 650.0000 mg | Freq: Four times a day (QID) | RECTAL | Status: DC | PRN

## 2011-10-23 MED ORDER — LORATADINE 10 MG PO TABS
10.0000 mg | ORAL_TABLET | Freq: Every day | ORAL | Status: DC
  Administered 2011-10-23 – 2011-10-24 (×2): 10 mg via ORAL
  Filled 2011-10-23 (×2): qty 1

## 2011-10-23 MED ORDER — LORAZEPAM 1 MG PO TABS: 1.0000 mg | ORAL_TABLET | Freq: Three times a day (TID) | ORAL | Status: DC | PRN

## 2011-10-23 MED ORDER — MORPHINE SULFATE 2 MG/ML IJ SOLN: 2.0000 mg | INTRAMUSCULAR | Status: DC | PRN

## 2011-10-23 MED ORDER — GABAPENTIN 100 MG PO CAPS
100.0000 mg | ORAL_CAPSULE | Freq: Two times a day (BID) | ORAL | Status: DC
  Administered 2011-10-23 – 2011-10-24 (×3): 100 mg via ORAL
  Filled 2011-10-23 (×4): qty 1

## 2011-10-23 MED ORDER — ACETAMINOPHEN 325 MG PO TABS
650.0000 mg | ORAL_TABLET | Freq: Four times a day (QID) | ORAL | Status: DC | PRN
  Administered 2011-10-23: 650 mg via ORAL
  Filled 2011-10-23: qty 2

## 2011-10-23 MED ORDER — IBUPROFEN 800 MG PO TABS: 400.0000 mg | ORAL_TABLET | Freq: Four times a day (QID) | ORAL | Status: DC | PRN

## 2011-10-23 MED ORDER — CELECOXIB 200 MG PO CAPS
200.0000 mg | ORAL_CAPSULE | Freq: Two times a day (BID) | ORAL | Status: DC
  Administered 2011-10-23 – 2011-10-24 (×3): 200 mg via ORAL
  Filled 2011-10-23 (×4): qty 1

## 2011-10-23 MED ORDER — VITAMIN D 1000 UNITS PO TABS: 20000.0000 [IU] | ORAL_TABLET | ORAL | Status: DC

## 2011-10-23 MED ORDER — SODIUM CHLORIDE 0.9 % IV SOLN
INTRAVENOUS | Status: DC
  Administered 2011-10-23 – 2011-10-24 (×4): via INTRAVENOUS

## 2011-10-23 MED ORDER — SODIUM CHLORIDE 0.9 % IJ SOLN: 3.0000 mL | Freq: Two times a day (BID) | INTRAMUSCULAR | Status: DC

## 2011-10-23 MED ORDER — ACETAMINOPHEN 325 MG PO TABS
650.0000 mg | ORAL_TABLET | Freq: Once | ORAL | Status: AC
  Administered 2011-10-23: 650 mg via ORAL
  Filled 2011-10-23: qty 2

## 2011-10-23 MED ORDER — ONDANSETRON HCL 4 MG/2ML IJ SOLN: 4.0000 mg | Freq: Four times a day (QID) | INTRAMUSCULAR | Status: DC | PRN

## 2011-10-23 MED ORDER — ZOLPIDEM TARTRATE 5 MG PO TABS
5.0000 mg | ORAL_TABLET | Freq: Every evening | ORAL | Status: DC | PRN
  Administered 2011-10-23: 5 mg via ORAL
  Filled 2011-10-23: qty 1

## 2011-10-23 MED ORDER — DEXTROSE 5 % IV SOLN
1.0000 g | Freq: Once | INTRAVENOUS | Status: AC
  Administered 2011-10-23: 1 g via INTRAVENOUS
  Filled 2011-10-23: qty 10

## 2011-10-23 MED ORDER — LEVOFLOXACIN IN D5W 500 MG/100ML IV SOLN
500.0000 mg | Freq: Every day | INTRAVENOUS | Status: DC
  Administered 2011-10-23 – 2011-10-24 (×2): 500 mg via INTRAVENOUS
  Filled 2011-10-23 (×2): qty 100

## 2011-10-23 NOTE — ED Provider Notes (Signed)
History     CSN: [id number]  Arrival date & time 10/23/11  0124   First MD Initiated Contact with Patient 10/23/11 [phone]      Chief Complaint  Patient presents with  . Cough  . Fever    (Consider location/radiation/quality/duration/timing/severity/associated sxs/prior treatment) HPI Comments: Visually history of metastatic breast cancer, and cutaneous cancer, who is currently in her third round of chemotherapy, ending reduce ago.  Now has 3 days of intermittent fevers to a maximum of 101.5.  She states on Wednesday.  She took the dog for a walk in the park and it is a lot of pollen, and nasal irritation with some rhinitis, that now has traveled down into her throat with irritation, but not "sore throat."  And a nonproductive harsh cough  Patient is a 56 y.o. female presenting with cough and fever. The history is provided by the patient.  Cough This is a new problem. The current episode started yesterday. The problem occurs every few hours. The problem has been gradually worsening. The cough is non-productive. The maximum temperature recorded prior to her arrival was 101 to 101.9 F. The fever has been present for 1 to 2 days. Pertinent negatives include no chest pain, no chills, no sweats, no ear congestion, no ear pain, no headaches, no rhinorrhea, no sore throat, no myalgias, no shortness of breath and no wheezing. She has tried nothing for the symptoms. She is not a smoker.  Fever Primary symptoms of the febrile illness include fever, cough and nausea. Primary symptoms do not include headaches, wheezing, shortness of breath, vomiting or myalgias.    Past Medical History  Diagnosis Date  . Breast cancer 06/2007  . Allergy   . Blood pressure elevated without history of HTN     monitor due to Avastin treatments    Past Surgical History  Procedure Date  . Breast surgery 2009    biposy. bilateral mastectomy. Breast cancer HER 2, chest wall. Mets 04/2008    Family History  Problem  Relation Age of Onset  . Heart disease Mother     due to mitral valve regurgiation,   . Cancer Mother     breast  . Parkinsonism Father   . Stroke Sister   . Alcohol abuse Paternal Grandfather     History  Substance Use Topics  . Smoking status: Never Smoker   . Smokeless tobacco: Never Used  . Alcohol Use: No    OB History    Grav Para Term Preterm Abortions TAB SAB Ect Mult Living                  Review of Systems  Constitutional: Positive for fever. Negative for chills.  HENT: Negative for ear pain, congestion, sore throat, rhinorrhea, postnasal drip and sinus pressure.   Respiratory: Positive for cough. Negative for shortness of breath and wheezing.   Cardiovascular: Negative for chest pain.  Gastrointestinal: Positive for nausea. Negative for vomiting.  Musculoskeletal: Negative for myalgias and back pain.  Neurological: Negative for dizziness, weakness and headaches.    Allergies  Tape; Dilaudid; and Penicillins  Home Medications   Current Outpatient Rx  Name Route Sig Dispense Refill  . KADCYLA IV Intravenous Inject into the vein. 2.5mg /kg    . CELECOXIB 200 MG PO CAPS Oral Take 200 mg by mouth 2 (two) times daily.      [name] CETIRIZINE HCL 10 MG PO TABS Oral Take 10 mg by mouth daily.      [name] VITAMIN  D 1000 UNITS PO TABS Oral Take 20,000 Units by mouth 3 (three) times a week.    [name] LOVENOX IJ Injection Inject 70 mcg as directed daily.      [name] GABAPENTIN 100 MG PO CAPS  One to two caps in the evening as directed 60 capsule 1  . IBUPROFEN 200 MG PO TABS Oral Take 400 mg by mouth every 6 (six) hours as needed. Pain    . LORAZEPAM 0.5 MG PO TABS Oral Take 1 tablet (0.5 mg total) by mouth daily as needed for anxiety (currently using less than 2x per week (06/16/11)).    [name] OMEPRAZOLE 20 MG PO CPDR Oral Take 20 mg by mouth daily as needed.     [name] ONDANSETRON HCL 8 MG PO TABS Oral Take 1 tablet (8 mg total) by mouth every 8 (eight) hours as needed for nausea.    [name] ZOLPIDEM  TARTRATE 5 MG PO TABS Oral Take 5 mg by mouth at bedtime as needed.      BP 120/74  Pulse 125  Temp(Src) 101.1 F (38.4 C) (Oral)  Resp 20  SpO2 95%  Physical Exam  Constitutional: She is oriented to person, place, and time. She appears well-developed and well-nourished.  HENT:  Head: Normocephalic.  Right Ear: Hearing, tympanic membrane, external ear and ear canal normal.  Left Ear: Hearing, tympanic membrane, external ear and ear canal normal.  Mouth/Throat: Uvula is midline and mucous membranes are normal. No posterior oropharyngeal edema or posterior oropharyngeal erythema.  Eyes: Pupils are equal, round, and reactive to light.  Neck: Normal range of motion.  Cardiovascular: Tachycardia present.   Pulmonary/Chest: Effort normal. She has no wheezes. She exhibits no tenderness.  Abdominal: Soft. Bowel sounds are normal. There is no tenderness.  Musculoskeletal: She exhibits no edema.  Neurological: She is alert and oriented to person, place, and time.  Skin: Skin is warm. No rash noted.    ED Course  Procedures (including critical care time)  Labs Reviewed  DIFFERENTIAL - Abnormal; Notable for the following:    Neutrophils Relative 86 (*)    Neutro Abs 8.4 (*)    Lymphocytes Relative 7 (*)    All other components within normal limits  URINALYSIS, ROUTINE W REFLEX MICROSCOPIC - Abnormal; Notable for the following:    APPearance CLOUDY (*)    Nitrite POSITIVE (*)    Leukocytes, UA MODERATE (*)    All other components within normal limits  URINE MICROSCOPIC-ADD ON - Abnormal; Notable for the following:    Squamous Epithelial / LPF FEW (*)    Bacteria, UA MANY (*)    All other components within normal limits  CBC  LACTIC ACID, PLASMA  COMPREHENSIVE METABOLIC PANEL  CULTURE, BLOOD (ROUTINE X 2)  CULTURE, BLOOD (ROUTINE X 2)   No results found.   No diagnosis found.    MDM   Patient is immunosuppressed    will obtain CBC CMet chest x-ray, urine, blood  cultures, and proceed, post results        Arman Filter, NP 10/23/11 0417  Arman Filter, NP 10/23/11 [phone]

## 2011-10-23 NOTE — ED Provider Notes (Signed)
Medical screening examination/treatment/procedure(s) were conducted as a shared visit with non-physician practitioner(s) and myself.  I personally evaluated the patient during the encounter  The patient currently receiving chemotherapy and has a fever in the emergency department.  She does have a rather significant urinary tract infection for which a urine culture was sent.  She's been given a dose of Rocephin.  The patient's persistently tachycardic in the emergency department the majority of this is likely volume depletion secondary to decreased oral intake today.  She'll be bolused IV fluids.  This could however represent early sepsis and thus the visual be admitted to observation overnight for ongoing evaluation.  Blood cultures were obtained.  She does have a port in her right chest which is a potential source.  Albuterol MDI was given for her persistent cough.  Will see if this helps with her cough.  She's had some sinus discomfort and therefore for fever remains persistent she may require imaging of her sinuses.   1. UTI (lower urinary tract infection)   2. Fever   3. Tachycardia    Results for orders placed during the hospital encounter of 10/23/11  CBC      Component Value Range   WBC 9.7  4.0 - 10.5 (K/uL)   RBC 3.92  3.87 - 5.11 (MIL/uL)   Hemoglobin 12.2  12.0 - 15.0 (g/dL)   HCT 16.1  09.6 - 04.5 (%)   MCV 94.6  78.0 - 100.0 (fL)   MCH 31.1  26.0 - 34.0 (pg)   MCHC 32.9  30.0 - 36.0 (g/dL)   RDW 40.9  81.1 - 91.4 (%)   Platelets 183  150 - 400 (K/uL)  DIFFERENTIAL      Component Value Range   Neutrophils Relative 86 (*) 43 - 77 (%)   Neutro Abs 8.4 (*) 1.7 - 7.7 (K/uL)   Lymphocytes Relative 7 (*) 12 - 46 (%)   Lymphs Abs 0.7  0.7 - 4.0 (K/uL)   Monocytes Relative 7  3 - 12 (%)   Monocytes Absolute 0.6  0.1 - 1.0 (K/uL)   Eosinophils Relative 0  0 - 5 (%)   Eosinophils Absolute 0.0  0.0 - 0.7 (K/uL)   Basophils Relative 0  0 - 1 (%)   Basophils Absolute 0.0  0.0 - 0.1  (K/uL)  COMPREHENSIVE METABOLIC PANEL      Component Value Range   Sodium 135  135 - 145 (mEq/L)   Potassium 3.6  3.5 - 5.1 (mEq/L)   Chloride 96  96 - 112 (mEq/L)   CO2 26  19 - 32 (mEq/L)   Glucose, Bld 122 (*) 70 - 99 (mg/dL)   BUN 12  6 - 23 (mg/dL)   Creatinine, Ser 7.82  0.50 - 1.10 (mg/dL)   Calcium 8.9  8.4 - 95.6 (mg/dL)   Total Protein 7.0  6.0 - 8.3 (g/dL)   Albumin 3.4 (*) 3.5 - 5.2 (g/dL)   AST 40 (*) 0 - 37 (U/L)   ALT 34  0 - 35 (U/L)   Alkaline Phosphatase 81  39 - 117 (U/L)   Total Bilirubin 0.6  0.3 - 1.2 (mg/dL)   GFR calc non Af Amer >90  >90 (mL/min)   GFR calc Af Amer >90  >90 (mL/min)  URINALYSIS, ROUTINE W REFLEX MICROSCOPIC      Component Value Range   Color, Urine YELLOW  YELLOW    APPearance CLOUDY (*) CLEAR    Specific Gravity, Urine 1.019  1.005 -  1.030    pH 6.5  5.0 - 8.0    Glucose, UA NEGATIVE  NEGATIVE (mg/dL)   Hgb urine dipstick NEGATIVE  NEGATIVE    Bilirubin Urine NEGATIVE  NEGATIVE    Ketones, ur NEGATIVE  NEGATIVE (mg/dL)   Protein, ur NEGATIVE  NEGATIVE (mg/dL)   Urobilinogen, UA 0.2  0.0 - 1.0 (mg/dL)   Nitrite POSITIVE (*) NEGATIVE    Leukocytes, UA MODERATE (*) NEGATIVE   LACTIC ACID, PLASMA      Component Value Range   Lactic Acid, Venous 0.9  0.5 - 2.2 (mmol/L)  URINE MICROSCOPIC-ADD ON      Component Value Range   Squamous Epithelial / LPF FEW (*) RARE    WBC, UA TOO NUMEROUS TO COUNT  <3 (WBC/hpf)   Bacteria, UA MANY (*) RARE    Urine-Other MUCOUS PRESENT    POCT I-STAT, CHEM 8      Component Value Range   Sodium 136  135 - 145 (mEq/L)   Potassium 3.8  3.5 - 5.1 (mEq/L)   Chloride 103  96 - 112 (mEq/L)   BUN 12  6 - 23 (mg/dL)   Creatinine, Ser 1.61  0.50 - 1.10 (mg/dL)   Glucose, Bld 096 (*) 70 - 99 (mg/dL)   Calcium, Ion 0.45 (*) 1.12 - 1.32 (mmol/L)   TCO2 25  0 - 100 (mmol/L)   Hemoglobin 12.9  12.0 - 15.0 (g/dL)   HCT 40.9  81.1 - 91.4 (%)     Lyanne Co, MD 10/23/11 [phone]

## 2011-10-23 NOTE — H&P (Signed)
PCP:   Thomos Lemons, DO, DO   Chief Complaint: Coughing   HPI: [name] is an 56 y.o. 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 Evaluation in emergency room included normal white count, clear chest x-ray, and normal renal functions. In the emergency room she was found to have a temp of 102, with good blood pressure, and compensatory tachycardia with heart rate 120. She was also found to have a urine tract infection as well. Hospitalist was asked to admit her for further evaluation and treatment.  Rewiew of Systems:  The patient denies anorexia, weight loss,, vision loss, decreased hearing, hoarseness, chest pain, syncope, dyspnea on exertion, peripheral edema, balance deficits, hemoptysis, abdominal pain, melena, hematochezia, severe indigestion/heartburn, hematuria, incontinence, genital sores, muscle weakness, transient blindness, difficulty walking, depression, unusual weight change, abnormal bleeding, enlarged lymph nodes, angioedema, and breast masses.    Past Medical History  Diagnosis Date  . Breast cancer 06/2007  . Allergy   . Blood pressure elevated without history of HTN     monitor due to Avastin treatments    Past Surgical History  Procedure Date  . Breast surgery 2009    biposy. bilateral mastectomy. Breast cancer HER 2, chest wall. Mets 04/2008    Medications:  HOME MEDS: Prior to Admission medications   Medication Sig Start Date End Date Taking? Authorizing Provider  Ado-Trastuzumab Emtansine (KADCYLA IV) Inject into the vein. 2.5mg /kg   Yes Historical Provider, MD  celecoxib (CELEBREX) 200 MG capsule Take 200 mg by mouth 2 (two) times daily.     Yes Historical Provider, MD  cetirizine (ZYRTEC) 10 MG tablet Take 10 mg by  mouth daily.     Yes Historical Provider, MD  cholecalciferol (VITAMIN D) 1000 UNITS tablet Take 20,000 Units by mouth 3 (three) times a week.   Yes Historical Provider, MD  Enoxaparin Sodium (LOVENOX IJ) Inject 70 mcg as directed daily.     Yes Historical Provider, MD  gabapentin (NEURONTIN) 100 MG capsule One to two caps in the evening as directed 07/25/11  Yes Doe-Hyun R Artist Pais, DO  ibuprofen (ADVIL,MOTRIN) 200 MG tablet Take 400 mg by mouth every 6 (six) hours as needed. Pain   Yes Historical Provider, MD  LORazepam (ATIVAN) 0.5 MG tablet Take 1 tablet (0.5 mg total) by mouth daily as needed for anxiety (currently using less than 2x per week (06/16/11)). 06/16/11  Yes Sanjuana Letters, MD  omeprazole (PRILOSEC) 20 MG capsule Take 20 mg by mouth daily as needed.    Yes Historical Provider, MD  ondansetron (ZOFRAN) 8 MG tablet Take 1 tablet (8 mg total) by mouth every 8 (eight) hours as needed for nausea. 06/19/11  Yes Sanjuana Letters, MD  zolpidem (AMBIEN) 5 MG tablet Take 5 mg by mouth at bedtime as needed.   Yes Historical Provider, MD     Allergies:  Allergies  Allergen Reactions  . Tape Hives    Can tolerate paper tape  . Dilaudid (Hydromorphone Hcl) Nausea And Vomiting  . Penicillins Rash    Denies airway involvement    Social History:   reports that she has never smoked. She has never used smokeless tobacco. She reports that she does not drink alcohol or use illicit drugs.  Family History: Family History  Problem Relation Age of Onset  .  Heart disease Mother     due to mitral valve regurgiation,   . Cancer Mother     breast  . Parkinsonism Father   . Stroke Sister   . Alcohol abuse Paternal Grandfather      Physical Exam: Filed Vitals:   10/23/11 0406 10/23/11 0408 10/23/11 0449 10/23/11 0500  BP:   119/55 122/64  Pulse:   120 124  Temp:   102.2 F (39 C)   TempSrc:   Oral   Resp:   20 23  SpO2: 89% 97% 98% 97%   Blood pressure 122/64, pulse 124,  temperature 102.2 F (39 C), temperature source Oral, resp. rate 23, SpO2 97.00%.  GEN:  Pleasant  person lying in the stretcher in no acute distress; cooperative with exam PSYCH:  alert and oriented x4; does not appear anxious or depressed; affect is appropriate. HEENT: Mucous membranes pink and anicteric; PERRLA; EOM intact; no cervical lymphadenopathy nor thyromegaly or carotid bruit; no JVD; Breasts:: Not examined CHEST WALL: No tenderness. She has a Port-A-Cath on her right upper chest. No evidence of infection. CHEST: Normal respiration, she has rhonchi throughout lung field. HEART: Regular rate and rhythm; no murmurs rubs or gallops BACK: No kyphosis or scoliosis; no CVA tenderness ABDOMEN:, soft non-tender; no masses, no organomegaly, normal abdominal bowel sounds; no pannus; no intertriginous candida. Rectal Exam: Not done EXTREMITIES: No bone or joint deformity; age-appropriate arthropathy of the hands and knees; no edema; no ulcerations. Genitalia: not examined PULSES: 2+ and symmetric SKIN: Normal hydration no rash or ulceration CNS: Cranial nerves 2-12 grossly intact no focal lateralizing neurologic deficit   Labs & Imaging Results for orders placed during the hospital encounter of 10/23/11 (from the past 48 hour(s))  CBC     Status: Normal   Collection Time   10/23/11  2:00 AM      Component Value Range Comment   WBC 9.7  4.0 - 10.5 (K/uL)    RBC 3.92  3.87 - 5.11 (MIL/uL)    Hemoglobin 12.2  12.0 - 15.0 (g/dL)    HCT 16.1  09.6 - 04.5 (%)    MCV 94.6  78.0 - 100.0 (fL)    MCH 31.1  26.0 - 34.0 (pg)    MCHC 32.9  30.0 - 36.0 (g/dL)    RDW 40.9  81.1 - 91.4 (%)    Platelets 183  150 - 400 (K/uL)   DIFFERENTIAL     Status: Abnormal   Collection Time   10/23/11  2:00 AM      Component Value Range Comment   Neutrophils Relative 86 (*) 43 - 77 (%)    Neutro Abs 8.4 (*) 1.7 - 7.7 (K/uL)    Lymphocytes Relative 7 (*) 12 - 46 (%)    Lymphs Abs 0.7  0.7 - 4.0 (K/uL)     Monocytes Relative 7  3 - 12 (%)    Monocytes Absolute 0.6  0.1 - 1.0 (K/uL)    Eosinophils Relative 0  0 - 5 (%)    Eosinophils Absolute 0.0  0.0 - 0.7 (K/uL)    Basophils Relative 0  0 - 1 (%)    Basophils Absolute 0.0  0.0 - 0.1 (K/uL)   COMPREHENSIVE METABOLIC PANEL     Status: Abnormal   Collection Time   10/23/11  2:00 AM      Component Value Range Comment   Sodium 135  135 - 145 (mEq/L)    Potassium 3.6  3.5 - 5.1 (mEq/L)  Chloride 96  96 - 112 (mEq/L)    CO2 26  19 - 32 (mEq/L)    Glucose, Bld 122 (*) 70 - 99 (mg/dL)    BUN 12  6 - 23 (mg/dL)    Creatinine, Ser 1.61  0.50 - 1.10 (mg/dL)    Calcium 8.9  8.4 - 10.5 (mg/dL)    Total Protein 7.0  6.0 - 8.3 (g/dL)    Albumin 3.4 (*) 3.5 - 5.2 (g/dL)    AST 40 (*) 0 - 37 (U/L)    ALT 34  0 - 35 (U/L)    Alkaline Phosphatase 81  39 - 117 (U/L)    Total Bilirubin 0.6  0.3 - 1.2 (mg/dL)    GFR calc non Af Amer >90  >90 (mL/min)    GFR calc Af Amer >90  >90 (mL/min)   LACTIC ACID, PLASMA     Status: Normal   Collection Time   10/23/11  2:21 AM      Component Value Range Comment   Lactic Acid, Venous 0.9  0.5 - 2.2 (mmol/L)   URINALYSIS, ROUTINE W REFLEX MICROSCOPIC     Status: Abnormal   Collection Time   10/23/11  2:22 AM      Component Value Range Comment   Color, Urine YELLOW  YELLOW     APPearance CLOUDY (*) CLEAR     Specific Gravity, Urine 1.019  1.005 - 1.030     pH 6.5  5.0 - 8.0     Glucose, UA NEGATIVE  NEGATIVE (mg/dL)    Hgb urine dipstick NEGATIVE  NEGATIVE     Bilirubin Urine NEGATIVE  NEGATIVE     Ketones, ur NEGATIVE  NEGATIVE (mg/dL)    Protein, ur NEGATIVE  NEGATIVE (mg/dL)    Urobilinogen, UA 0.2  0.0 - 1.0 (mg/dL)    Nitrite POSITIVE (*) NEGATIVE     Leukocytes, UA MODERATE (*) NEGATIVE    URINE MICROSCOPIC-ADD ON     Status: Abnormal   Collection Time   10/23/11  2:22 AM      Component Value Range Comment   Squamous Epithelial / LPF FEW (*) RARE     WBC, UA TOO NUMEROUS TO COUNT  <3 (WBC/hpf)     Bacteria, UA MANY (*) RARE     Urine-Other MUCOUS PRESENT     POCT I-STAT, CHEM 8     Status: Abnormal   Collection Time   10/23/11  2:52 AM      Component Value Range Comment   Sodium 136  135 - 145 (mEq/L)    Potassium 3.8  3.5 - 5.1 (mEq/L)    Chloride 103  96 - 112 (mEq/L)    BUN 12  6 - 23 (mg/dL)    Creatinine, Ser 0.96  0.50 - 1.10 (mg/dL)    Glucose, Bld 045 (*) 70 - 99 (mg/dL)    Calcium, Ion 4.09 (*) 1.12 - 1.32 (mmol/L)    TCO2 25  0 - 100 (mmol/L)    Hemoglobin 12.9  12.0 - 15.0 (g/dL)    HCT 81.1  91.4 - 78.2 (%)    Dg Chest 2 View  10/23/2011  *RADIOLOGY REPORT*  Clinical Data: Cancer patient with fever.  CHEST - 2 VIEW  Comparison: 04/21/2008  Findings: Postoperative changes with probable bilateral mastectomies and surgical clips in the axilla.  Right central venous catheter with tip over the mid SVC region.  Slightly shallow inspiration.  Slightly shallow inspiration.  Normal heart size and pulmonary  vascularity.  No focal airspace consolidation in the lungs.  No blunting of costophrenic angles.  Hila appear symmetrical.  Pulmonary nodules suggested previously are not delineated on today's study.  Degenerative changes in the spine and shoulders.  The  IMPRESSION: No evidence of active pulmonary disease.  Original Report Authenticated By: Marlon Pel, M.D.      Assessment Present on Admission:  .Pyelonephritis .Breast cancer .Anxiety, generalized .URI, acute   PLAN:  I believe she has pyelonephritis. She has positive UA and temp of 102. She might also has a pulmonary source although the cough is nonproductive and a chest x-ray is clear. She was given Rocephin in emergency room, but I will switch over to Levaquin. Her tachycardia is likely from the temperature of 102. Will give her IV fluid as well as IV Ativan for her anxiety. She is stable and will admit her to telemetry under triad hospitalist service. I did not order her chemotherapy, but this might have to be  reordered tomorrow. I am deferring this to her oncologist.   Other plans as per orders.   Satia Winger 10/23/2011, 5:40 AM

## 2011-10-23 NOTE — Progress Notes (Signed)
   CARE MANAGEMENT NOTE 10/23/2011  Patient:  [name], [name]   Account Number:  [id number]  Date Initiated:  10/23/2011  Documentation initiated by:  Jiles Crocker  Subjective/Objective Assessment:   ADMITTED WITH PYELONEPHRITIS     Action/Plan:   PCP: Thomos Lemons, DO; INDEPENDENT PRIOR TO ADMISSION   Anticipated DC Date:  10/30/2011   Anticipated DC Plan:  HOME/SELF CARE      DC Planning Services  CM consult              Status of service:  In process, will continue to follow Medicare Important Message given?  NA - LOS <3 / Initial given by admissions (If response is "NO", the following Medicare IM given date fields will be blank)  Per UR Regulation:  Reviewed for med. necessity/level of care/duration of stay  Comments:  10/23/2011- B Montgomery Favor RN, BSN, MHA

## 2011-10-23 NOTE — ED Notes (Signed)
Pt states she took 400 mg of motrin at 0100 am

## 2011-10-23 NOTE — ED Notes (Signed)
Pt received chemo treatment on Tuesday and since then she's developed a cough and intermittent fevers

## 2011-10-23 NOTE — Progress Notes (Signed)
Subjective: Patient feeling better since her fever broke.  No other specific complaints.  Denies any flank pain.  Patient complaining of sinus congestion.  Objective: Vital signs in last 24 hours: Filed Vitals:   10/23/11 0530 10/23/11 0541 10/23/11 0553 10/23/11 0606  BP: 118/62   119/70  Pulse: 116  112 109  Temp:   99.4 F (37.4 C) 98.4 F (36.9 C)  TempSrc:   Oral Oral  Resp: 22  22 20   Height:  5\' 8"  (1.727 m)  5\' 8"  (1.727 m)  Weight:  82.555 kg (182 lb)  80.377 kg (177 lb 3.2 oz)  SpO2: 97%  95% 95%   Weight change:  No intake or output data in the 24 hours ending 10/23/11 1231  Physical Exam: General: Awake, Oriented, No acute distress. HEENT: EOMI. Neck: Supple CV: S1 and S2 Lungs: Clear to ascultation bilaterally Abdomen: Soft, Nontender, Nondistended, +bowel sounds. Ext: Good pulses. Trace edema. Back: No tenderness to palpation over her flank bilaterally.  Lab Results:  Basename 10/23/11 0252 10/23/11 0200  NA 136 135  K 3.8 3.6  CL 103 96  CO2 -- 26  GLUCOSE 129* 122*  BUN 12 12  CREATININE 0.80 0.70  CALCIUM -- 8.9  MG -- --  PHOS -- --    Basename 10/23/11 0200  AST 40*  ALT 34  ALKPHOS 81  BILITOT 0.6  PROT 7.0  ALBUMIN 3.4*   No results found for this basename: LIPASE:2,AMYLASE:2 in the last 72 hours  Basename 10/23/11 0252 10/23/11 0200  WBC -- 9.7  NEUTROABS -- 8.4*  HGB 12.9 12.2  HCT 38.0 37.1  MCV -- 94.6  PLT -- 183   No results found for this basename: CKTOTAL:3,CKMB:3,CKMBINDEX:3,TROPONINI:3 in the last 72 hours No components found with this basename: POCBNP:3 No results found for this basename: DDIMER:2 in the last 72 hours No results found for this basename: HGBA1C:2 in the last 72 hours No results found for this basename: CHOL:2,HDL:2,LDLCALC:2,TRIG:2,CHOLHDL:2,LDLDIRECT:2 in the last 72 hours  Basename 10/23/11 0200  TSH 1.583  T4TOTAL --  T3FREE --  THYROIDAB --   No results found for this basename:  VITAMINB12:2,FOLATE:2,FERRITIN:2,TIBC:2,IRON:2,RETICCTPCT:2 in the last 72 hours  Micro Results: No results found for this or any previous visit (from the past 240 hour(s)).  Studies/Results: Dg Chest 2 View  10/23/2011  *RADIOLOGY REPORT*  Clinical Data: Cancer patient with fever.  CHEST - 2 VIEW  Comparison: 04/21/2008  Findings: Postoperative changes with probable bilateral mastectomies and surgical clips in the axilla.  Right central venous catheter with tip over the mid SVC region.  Slightly shallow inspiration.  Slightly shallow inspiration.  Normal heart size and pulmonary vascularity.  No focal airspace consolidation in the lungs.  No blunting of costophrenic angles.  Hila appear symmetrical.  Pulmonary nodules suggested previously are not delineated on today's study.  Degenerative changes in the spine and shoulders.  The  IMPRESSION: No evidence of active pulmonary disease.  Original Report Authenticated By: Marlon Pel, M.D.   US Renal  10/23/2011  *RADIOLOGY REPORT*  Clinical Data:  Pyelonephritis, breast cancer  RENAL/URINARY TRACT ULTRASOUND COMPLETE  Comparison:  None.  Findings:  Right Kidney:  Normal in size and parenchymal echogenicity. Measures 10.0 cm. No evidence of mass or hydronephrosis.  Left Kidney:  Normal in size and parenchymal echogenicity. Measures 10.8 cm. No evidence of mass or hydronephrosis.  Bladder:  Appears normal for degree of bladder distention.  IMPRESSION: Normal study.  Original Report Authenticated By:  Brandon Melnick, M.D.    Medications: I have reviewed the patient's current medications. Scheduled Meds:   . acetaminophen  650 mg Oral Once  . albuterol  2 puff Inhalation Once  . cefTRIAXone (ROCEPHIN)  IV  1 g Intravenous Once  . celecoxib  200 mg Oral BID  . cholecalciferol  2,000 Units Oral Custom  . enoxaparin (LOVENOX) injection  70 mg Subcutaneous Daily  . gabapentin  100 mg Oral BID  . levofloxacin (LEVAQUIN) IV  500 mg Intravenous Q  breakfast  . loratadine  10 mg Oral Daily  . pantoprazole  40 mg Oral Q1200  . sodium chloride  500 mL Intravenous Once  . sodium chloride  3 mL Intravenous Q12H  . DISCONTD: cholecalciferol  20,000 Units Oral 3 times weekly  . DISCONTD: enoxaparin  70 mg Subcutaneous Q24H   Continuous Infusions:   . sodium chloride 100 mL/hr at 10/23/11 1029   PRN Meds:.acetaminophen, acetaminophen, ibuprofen, LORazepam, morphine injection, ondansetron (ZOFRAN) IV, ondansetron, zolpidem  Assessment/Plan: Pyelonephritis/UTI Continue levofloxacin.  Antibiotics since 10/23/2011.  Ceftriaxone given once in the ER.  Urine culture pending.  Change antibiotics depending on urine culture sensitivities results.  Renal ultrasound on 10/23/2011 was normal.  Fever Secondary to pyelonephritis.  Improved.  Breast cancer Patient gets her care at [facility].  Generalized anxiety Status.  GERD Continue PPI.  Upper respiratory infection Likely due to viral process.  Send for influenza panel.  Levaquin should have adequate bacterial coverage.  Chest x-ray on 10/23/2011 did not show cardiopulmonary process.  Prophylaxis Lovenox.  Disposition If afebrile and feeling better tomorrow consider discharge tomorrow.   LOS: 0 days  Kendrew Paci A, MD 10/23/2011, 12:31 PM

## 2011-10-24 DIAGNOSIS — F411 Generalized anxiety disorder: Secondary | ICD-10-CM

## 2011-10-24 DIAGNOSIS — R5381 Other malaise: Secondary | ICD-10-CM

## 2011-10-24 DIAGNOSIS — E876 Hypokalemia: Secondary | ICD-10-CM | POA: Diagnosis not present

## 2011-10-24 DIAGNOSIS — C50919 Malignant neoplasm of unspecified site of unspecified female breast: Secondary | ICD-10-CM

## 2011-10-24 DIAGNOSIS — R000 Tachycardia, unspecified: Secondary | ICD-10-CM | POA: Diagnosis present

## 2011-10-24 DIAGNOSIS — J069 Acute upper respiratory infection, unspecified: Secondary | ICD-10-CM

## 2011-10-24 LAB — BASIC METABOLIC PANEL
BUN: 7 mg/dL (ref 6–23)
CO2: 27 mEq/L (ref 19–32)
Calcium: 8.5 mg/dL (ref 8.4–10.5)
GFR calc non Af Amer: >90 mL/min (ref >90)
Glucose, Bld: 87 mg/dL (ref 70–99)
Sodium: 135 mEq/L (ref 135–145)

## 2011-10-24 LAB — CBC
MCH: 30.4 pg (ref 26.0–34.0)
MCHC: 32.1 g/dL (ref 30.0–36.0)
MCV: 95 fL (ref 78.0–100.0)
Platelets: 137 10*3/uL — ABNORMAL LOW (ref 150–400)
RBC: 3.58 MIL/uL — ABNORMAL LOW (ref 3.87–5.11)

## 2011-10-24 LAB — URINE CULTURE
Colony Count: NO GROWTH
Culture: NO GROWTH

## 2011-10-24 LAB — INFLUENZA PANEL BY PCR (TYPE A & B): Influenza A By PCR: NEGATIVE

## 2011-10-24 MED ORDER — SODIUM CHLORIDE 0.9 % IJ SOLN
10.0000 mL | INTRAMUSCULAR | Status: DC | PRN
  Administered 2011-10-24 (×2): 10 mL

## 2011-10-24 MED ORDER — HEPARIN SOD (PORK) LOCK FLUSH 100 UNIT/ML IV SOLN
500.0000 [IU] | Freq: Once | INTRAVENOUS | Status: AC
  Administered 2011-10-24: 500 [IU] via INTRAVENOUS

## 2011-10-24 MED ORDER — UNABLE TO FIND: Status: DC

## 2011-10-24 MED ORDER — LEVOFLOXACIN 500 MG PO TABS: 500.0000 mg | ORAL_TABLET | Freq: Every day | ORAL | Status: DC

## 2011-10-24 MED ORDER — POTASSIUM CHLORIDE CRYS ER 20 MEQ PO TBCR
40.0000 meq | EXTENDED_RELEASE_TABLET | Freq: Once | ORAL | Status: AC
  Administered 2011-10-24: 40 meq via ORAL
  Filled 2011-10-24: qty 2

## 2011-10-24 NOTE — Discharge Summary (Signed)
Discharge Summary  [name] MR#: [id number]  DOB:[birth year]  Date of Admission: 10/23/2011 Date of Discharge: 10/24/2011  Patient's PCP: Thomos Lemons, DO, DO  Attending Physician:Marcos Ruelas A  Consults: None  Discharge Diagnoses: Principal Problem:  *Pyelonephritis Active Problems:  Breast cancer  Chronic anticoagulation  Anxiety, generalized  URI, acute  Tachycardia  Hypokalemia  Brief Admitting History and Physical [name] is an 56 y.o. 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   Discharge Medications Medication List  As of 10/24/2011 11:32 AM   TAKE these medications         celecoxib 200 MG capsule   Commonly known as: CELEBREX   Take 200 mg by mouth 2 (two) times daily.      cetirizine 10 MG tablet   Commonly known as: ZYRTEC   Take 10 mg by mouth daily.      cholecalciferol 400 UNITS Tabs   Commonly known as: VITAMIN D   Take 2,000 Units by mouth 3 (three) times a week. Mon, Wed, and Fri      gabapentin 100 MG capsule   Commonly known as: NEURONTIN   One to two caps in the evening as directed      ibuprofen 200 MG tablet   Commonly known as: ADVIL,MOTRIN   Take 400 mg by mouth every 6 (six) hours as needed. Pain      KADCYLA IV   Inject into the vein. 2.5mg /kg      levofloxacin 500 MG tablet   Commonly known as: LEVAQUIN   Take 1 tablet (500 mg total) by mouth daily.      LORazepam 0.5 MG tablet   Commonly known as: ATIVAN   Take 1 tablet (0.5 mg total) by mouth daily as needed for anxiety (currently using less than 2x per week (06/16/11)).      LOVENOX IJ   Inject 70 mcg as directed daily.      omeprazole 20 MG capsule   Commonly known as: PRILOSEC   Take 20 mg by mouth daily as needed.     ondansetron 8 MG tablet   Commonly known as: ZOFRAN   Take 1 tablet (8 mg total) by mouth every 8 (eight) hours as needed for nausea.      UNABLE TO FIND   Please excuse [name] from any work obligations from 10/23/2011 till 10/26/2011 as the patient was hospitalized. Patient to return to work on 10/27/2011 without any restrictions.      zolpidem 5 MG tablet   Commonly known as: AMBIEN   Take 5 mg by mouth at bedtime as needed.            Hospital Course: Pyelonephritis/UTI Ceftriaxone given once in the ER.  Transition to levofloxacin.  Antibiotics since 10/23/2011.  Urine culture shows no growth.  Continue levofloxacin for 3 more days to complete an empiric five-day course. Renal ultrasound on 10/23/2011 was normal.  Fever Secondary to pyelonephritis.  Resolved.  Breast cancer Patient gets her care at [facility].  On prophylactic dose of Lovenox to prevent any blood clots, patient reports she does not have a history of DVT or PE.  She did report that her port did clot in the past.  Generalized anxiety Status.  GERD Continue PPI.  Upper respiratory infection Likely due to  viral/seasonal process.  Send for influenza panel negative.  Levaquin should have adequate bacterial coverage.  Chest x-ray on 10/23/2011 did not show cardiopulmonary process.  Hypokalemia Replace as needed.  Anemia Drop in hemoglobin likely delusional.  Tachycardia No events on telemetry except PVCs.  Patient normally reports her heart rate in the 70s.  Maybe due to urinary tract infection. Instructed the patient to followup with her primary care physician for further care and management.  Patient's blood pressures are on the soft side, during the hospital stay.  Day of Discharge BP 112/76  Pulse 85  Temp(Src) 98 F (36.7 C) (Oral)  Resp 15  Ht 5\' 8"  (1.727 m)  Wt 80.377 kg (177 lb 3.2 oz)  BMI 26.94 kg/m2  SpO2 94%  Results for orders placed during the hospital encounter of 10/23/11  (from the past 48 hour(s))  CBC     Status: Normal   Collection Time   10/23/11  2:00 AM      Component Value Range Comment   WBC 9.7  4.0 - 10.5 (K/uL)    RBC 3.92  3.87 - 5.11 (MIL/uL)    Hemoglobin 12.2  12.0 - 15.0 (g/dL)    HCT 16.1  09.6 - 04.5 (%)    MCV 94.6  78.0 - 100.0 (fL)    MCH 31.1  26.0 - 34.0 (pg)    MCHC 32.9  30.0 - 36.0 (g/dL)    RDW 40.9  81.1 - 91.4 (%)    Platelets 183  150 - 400 (K/uL)   DIFFERENTIAL     Status: Abnormal   Collection Time   10/23/11  2:00 AM      Component Value Range Comment   Neutrophils Relative 86 (*) 43 - 77 (%)    Neutro Abs 8.4 (*) 1.7 - 7.7 (K/uL)    Lymphocytes Relative 7 (*) 12 - 46 (%)    Lymphs Abs 0.7  0.7 - 4.0 (K/uL)    Monocytes Relative 7  3 - 12 (%)    Monocytes Absolute 0.6  0.1 - 1.0 (K/uL)    Eosinophils Relative 0  0 - 5 (%)    Eosinophils Absolute 0.0  0.0 - 0.7 (K/uL)    Basophils Relative 0  0 - 1 (%)    Basophils Absolute 0.0  0.0 - 0.1 (K/uL)   COMPREHENSIVE METABOLIC PANEL     Status: Abnormal   Collection Time   10/23/11  2:00 AM      Component Value Range Comment   Sodium 135  135 - 145 (mEq/L)    Potassium 3.6  3.5 - 5.1 (mEq/L)    Chloride 96  96 - 112 (mEq/L)    CO2 26  19 - 32 (mEq/L)    Glucose, Bld 122 (*) 70 - 99 (mg/dL)    BUN 12  6 - 23 (mg/dL)    Creatinine, Ser 7.82  0.50 - 1.10 (mg/dL)    Calcium 8.9  8.4 - 10.5 (mg/dL)    Total Protein 7.0  6.0 - 8.3 (g/dL)    Albumin 3.4 (*) 3.5 - 5.2 (g/dL)    AST 40 (*) 0 - 37 (U/L)    ALT 34  0 - 35 (U/L)    Alkaline Phosphatase 81  39 - 117 (U/L)    Total Bilirubin 0.6  0.3 - 1.2 (mg/dL)    GFR calc non Af Amer >90  >90 (mL/min)    GFR calc Af Amer >90  >90 (mL/min)   TSH  Status: Normal   Collection Time   10/23/11  2:00 AM      Component Value Range Comment   TSH 1.583  0.350 - 4.500 (uIU/mL)   LACTIC ACID, PLASMA     Status: Normal   Collection Time   10/23/11  2:21 AM      Component Value Range Comment   Lactic Acid, Venous 0.9  0.5 -  2.2 (mmol/L)   URINALYSIS, ROUTINE W REFLEX MICROSCOPIC     Status: Abnormal   Collection Time   10/23/11  2:22 AM      Component Value Range Comment   Color, Urine YELLOW  YELLOW     APPearance CLOUDY (*) CLEAR     Specific Gravity, Urine 1.019  1.005 - 1.030     pH 6.5  5.0 - 8.0     Glucose, UA NEGATIVE  NEGATIVE (mg/dL)    Hgb urine dipstick NEGATIVE  NEGATIVE     Bilirubin Urine NEGATIVE  NEGATIVE     Ketones, ur NEGATIVE  NEGATIVE (mg/dL)    Protein, ur NEGATIVE  NEGATIVE (mg/dL)    Urobilinogen, UA 0.2  0.0 - 1.0 (mg/dL)    Nitrite POSITIVE (*) NEGATIVE     Leukocytes, UA MODERATE (*) NEGATIVE    URINE MICROSCOPIC-ADD ON     Status: Abnormal   Collection Time   10/23/11  2:22 AM      Component Value Range Comment   Squamous Epithelial / LPF FEW (*) RARE     WBC, UA TOO NUMEROUS TO COUNT  <3 (WBC/hpf)    Bacteria, UA MANY (*) RARE     Urine-Other MUCOUS PRESENT     URINE CULTURE     Status: Normal   Collection Time   10/23/11  2:22 AM      Component Value Range Comment   Specimen Description URINE, CLEAN CATCH      Special Requests Immunocompromised      Culture  Setup Time [id number]      Colony Count NO GROWTH      Culture NO GROWTH      Report Status 10/24/2011 FINAL     CULTURE, BLOOD (ROUTINE X 2)     Status: Normal (Preliminary result)   Collection Time   10/23/11  2:40 AM      Component Value Range Comment   Specimen Description BLOOD PORTA CATH      Special Requests BOTTLES DRAWN AEROBIC AND ANAEROBIC 5CC      Culture  Setup Time [id number]      Culture        Value:        BLOOD CULTURE RECEIVED NO GROWTH TO DATE CULTURE WILL BE HELD FOR 5 DAYS BEFORE ISSUING A FINAL NEGATIVE REPORT   Report Status PENDING     CULTURE, BLOOD (ROUTINE X 2)     Status: Normal (Preliminary result)   Collection Time   10/23/11  2:44 AM      Component Value Range Comment   Specimen Description BLOOD RIGHT HAND      Special Requests BOTTLES DRAWN AEROBIC AND ANAEROBIC 3CC        Culture  Setup Time [id number]      Culture        Value:        BLOOD CULTURE RECEIVED NO GROWTH TO DATE CULTURE WILL BE HELD FOR 5 DAYS BEFORE ISSUING A FINAL NEGATIVE REPORT   Report Status PENDING     POCT I-STAT, CHEM 8  Status: Abnormal   Collection Time   10/23/11  2:52 AM      Component Value Range Comment   Sodium 136  135 - 145 (mEq/L)    Potassium 3.8  3.5 - 5.1 (mEq/L)    Chloride 103  96 - 112 (mEq/L)    BUN 12  6 - 23 (mg/dL)    Creatinine, Ser 6.04  0.50 - 1.10 (mg/dL)    Glucose, Bld 540 (*) 70 - 99 (mg/dL)    Calcium, Ion 9.81 (*) 1.12 - 1.32 (mmol/L)    TCO2 25  0 - 100 (mmol/L)    Hemoglobin 12.9  12.0 - 15.0 (g/dL)    HCT 19.1  47.8 - 29.5 (%)   INFLUENZA PANEL BY PCR     Status: Normal   Collection Time   10/23/11  3:00 PM      Component Value Range Comment   Influenza A By PCR NEGATIVE  NEGATIVE     Influenza B By PCR NEGATIVE  NEGATIVE     H1N1 flu by pcr NOT DETECTED  NOT DETECTED    BASIC METABOLIC PANEL     Status: Abnormal   Collection Time   10/24/11  4:33 AM      Component Value Range Comment   Sodium 135  135 - 145 (mEq/L)    Potassium 3.3 (*) 3.5 - 5.1 (mEq/L)    Chloride 100  96 - 112 (mEq/L)    CO2 27  19 - 32 (mEq/L)    Glucose, Bld 87  70 - 99 (mg/dL)    BUN 7  6 - 23 (mg/dL)    Creatinine, Ser 6.21  0.50 - 1.10 (mg/dL)    Calcium 8.5  8.4 - 10.5 (mg/dL)    GFR calc non Af Amer >90  >90 (mL/min)    GFR calc Af Amer >90  >90 (mL/min)   CBC     Status: Abnormal   Collection Time   10/24/11  4:33 AM      Component Value Range Comment   WBC 9.5  4.0 - 10.5 (K/uL)    RBC 3.58 (*) 3.87 - 5.11 (MIL/uL)    Hemoglobin 10.9 (*) 12.0 - 15.0 (g/dL)    HCT 30.8 (*) 65.7 - 46.0 (%)    MCV 95.0  78.0 - 100.0 (fL)    MCH 30.4  26.0 - 34.0 (pg)    MCHC 32.1  30.0 - 36.0 (g/dL)    RDW 84.6  96.2 - 95.2 (%)    Platelets 137 (*) 150 - 400 (K/uL)     Dg Chest 2 View  10/23/2011  *RADIOLOGY REPORT*  Clinical Data: Cancer patient with fever.   CHEST - 2 VIEW  Comparison: 04/21/2008  Findings: Postoperative changes with probable bilateral mastectomies and surgical clips in the axilla.  Right central venous catheter with tip over the mid SVC region.  Slightly shallow inspiration.  Slightly shallow inspiration.  Normal heart size and pulmonary vascularity.  No focal airspace consolidation in the lungs.  No blunting of costophrenic angles.  Hila appear symmetrical.  Pulmonary nodules suggested previously are not delineated on today's study.  Degenerative changes in the spine and shoulders.  The  IMPRESSION: No evidence of active pulmonary disease.  Original Report Authenticated By: Marlon Pel, M.D.   US Renal  10/23/2011  *RADIOLOGY REPORT*  Clinical Data:  Pyelonephritis, breast cancer  RENAL/URINARY TRACT ULTRASOUND COMPLETE  Comparison:  None.  Findings:  Right Kidney:  Normal in size and  parenchymal echogenicity. Measures 10.0 cm. No evidence of mass or hydronephrosis.  Left Kidney:  Normal in size and parenchymal echogenicity. Measures 10.8 cm. No evidence of mass or hydronephrosis.  Bladder:  Appears normal for degree of bladder distention.  IMPRESSION: Normal study.  Original Report Authenticated By: Brandon Melnick, M.D.   Disposition: Home  Diet: Heart healthy  Activity: Resume as tolerated   Follow-up Appts: Discharge Orders    Future Orders Please Complete By Expires   Diet - low sodium heart healthy      Increase activity slowly      Discharge instructions      Comments:   Followup with Thomos Lemons, DO (PCP) in 1 week. Followup with [facility] in 2 weeks. If you are still tachycardic in 1 week please discuss with Dr. Artist Pais for further evaluation.      TESTS THAT NEED FOLLOW-UP None  Time spent on discharge, talking to the patient, and coordinating care: 25 mins.   Signed: Cristal Ford, MD 10/24/2011, 11:32 AM

## 2011-10-24 NOTE — Progress Notes (Signed)
Subjective: Continues to feel better.  Reports that her nasal congestion is better.  No other specific complaint.  Objective: Vital signs in last 24 hours: Filed Vitals:   10/23/11 0606 10/23/11 1450 10/23/11 2158 10/24/11 0559  BP: 119/70 110/68 125/77 112/76  Pulse: 109 104 108 85  Temp: 98.4 F (36.9 C) 99.5 F (37.5 C) 99.3 F (37.4 C) 98 F (36.7 C)  TempSrc: Oral Oral Oral Oral  Resp: 20 20 20 15   Height: 5\' 8"  (1.727 m)     Weight: 80.377 kg (177 lb 3.2 oz)     SpO2: 95% 97% 94% 94%   Weight change:   Intake/Output Summary (Last 24 hours) at 10/24/11 1120 Last data filed at 10/24/11 0847  Gross per 24 hour  Intake 3128.33 ml  Output      0 ml  Net 3128.33 ml    Physical Exam: General: Awake, Oriented, No acute distress. HEENT: EOMI. Neck: Supple CV: S1 and S2 Lungs: Clear to ascultation bilaterally Abdomen: Soft, Nontender, Nondistended, +bowel sounds. Ext: Good pulses. Trace edema.  Lab Results:  Basename 10/24/11 0433 10/23/11 0252 10/23/11 0200  NA 135 136 --  K 3.3* 3.8 --  CL 100 103 --  CO2 27 -- 26  GLUCOSE 87 129* --  BUN 7 12 --  CREATININE 0.58 0.80 --  CALCIUM 8.5 -- 8.9  MG -- -- --  PHOS -- -- --    Basename 10/23/11 0200  AST 40*  ALT 34  ALKPHOS 81  BILITOT 0.6  PROT 7.0  ALBUMIN 3.4*   No results found for this basename: LIPASE:2,AMYLASE:2 in the last 72 hours  Basename 10/24/11 0433 10/23/11 0252 10/23/11 0200  WBC 9.5 -- 9.7  NEUTROABS -- -- 8.4*  HGB 10.9* 12.9 --  HCT 34.0* 38.0 --  MCV 95.0 -- 94.6  PLT 137* -- 183   No results found for this basename: CKTOTAL:3,CKMB:3,CKMBINDEX:3,TROPONINI:3 in the last 72 hours No components found with this basename: POCBNP:3 No results found for this basename: DDIMER:2 in the last 72 hours No results found for this basename: HGBA1C:2 in the last 72 hours No results found for this basename: CHOL:2,HDL:2,LDLCALC:2,TRIG:2,CHOLHDL:2,LDLDIRECT:2 in the last 72 hours  Basename  10/23/11 0200  TSH 1.583  T4TOTAL --  T3FREE --  THYROIDAB --   No results found for this basename: VITAMINB12:2,FOLATE:2,FERRITIN:2,TIBC:2,IRON:2,RETICCTPCT:2 in the last 72 hours  Micro Results: Recent Results (from the past 240 hour(s))  URINE CULTURE     Status: Normal   Collection Time   10/23/11  2:22 AM      Component Value Range Status Comment   Specimen Description URINE, CLEAN CATCH   Final    Special Requests Immunocompromised   Final    Culture  Setup Time [id number]   Final    Colony Count NO GROWTH   Final    Culture NO GROWTH   Final    Report Status 10/24/2011 FINAL   Final   CULTURE, BLOOD (ROUTINE X 2)     Status: Normal (Preliminary result)   Collection Time   10/23/11  2:40 AM      Component Value Range Status Comment   Specimen Description BLOOD PORTA CATH   Final    Special Requests BOTTLES DRAWN AEROBIC AND ANAEROBIC 5CC   Final    Culture  Setup Time [id number]   Final    Culture     Final    Value:        BLOOD CULTURE RECEIVED NO  GROWTH TO DATE CULTURE WILL BE HELD FOR 5 DAYS BEFORE ISSUING A FINAL NEGATIVE REPORT   Report Status PENDING   Incomplete   CULTURE, BLOOD (ROUTINE X 2)     Status: Normal (Preliminary result)   Collection Time   10/23/11  2:44 AM      Component Value Range Status Comment   Specimen Description BLOOD RIGHT HAND   Final    Special Requests BOTTLES DRAWN AEROBIC AND ANAEROBIC 3CC   Final    Culture  Setup Time [id number]   Final    Culture     Final    Value:        BLOOD CULTURE RECEIVED NO GROWTH TO DATE CULTURE WILL BE HELD FOR 5 DAYS BEFORE ISSUING A FINAL NEGATIVE REPORT   Report Status PENDING   Incomplete     Studies/Results: Dg Chest 2 View  10/23/2011  *RADIOLOGY REPORT*  Clinical Data: Cancer patient with fever.  CHEST - 2 VIEW  Comparison: 04/21/2008  Findings: Postoperative changes with probable bilateral mastectomies and surgical clips in the axilla.  Right central venous catheter with tip over the mid SVC  region.  Slightly shallow inspiration.  Slightly shallow inspiration.  Normal heart size and pulmonary vascularity.  No focal airspace consolidation in the lungs.  No blunting of costophrenic angles.  Hila appear symmetrical.  Pulmonary nodules suggested previously are not delineated on today's study.  Degenerative changes in the spine and shoulders.  The  IMPRESSION: No evidence of active pulmonary disease.  Original Report Authenticated By: Marlon Pel, M.D.   US Renal  10/23/2011  *RADIOLOGY REPORT*  Clinical Data:  Pyelonephritis, breast cancer  RENAL/URINARY TRACT ULTRASOUND COMPLETE  Comparison:  None.  Findings:  Right Kidney:  Normal in size and parenchymal echogenicity. Measures 10.0 cm. No evidence of mass or hydronephrosis.  Left Kidney:  Normal in size and parenchymal echogenicity. Measures 10.8 cm. No evidence of mass or hydronephrosis.  Bladder:  Appears normal for degree of bladder distention.  IMPRESSION: Normal study.  Original Report Authenticated By: Brandon Melnick, M.D.    Medications: I have reviewed the patient's current medications. Scheduled Meds:    . celecoxib  200 mg Oral BID  . cholecalciferol  2,000 Units Oral Custom  . enoxaparin (LOVENOX) injection  70 mg Subcutaneous Daily  . gabapentin  100 mg Oral BID  . levofloxacin (LEVAQUIN) IV  500 mg Intravenous Q breakfast  . loratadine  10 mg Oral Daily  . pantoprazole  40 mg Oral Q1200  . potassium chloride  40 mEq Oral Once  . sodium chloride  3 mL Intravenous Q12H   Continuous Infusions:    . DISCONTD: sodium chloride 100 mL/hr at 10/24/11 0517   PRN Meds:.acetaminophen, acetaminophen, ibuprofen, LORazepam, morphine injection, ondansetron (ZOFRAN) IV, ondansetron, sodium chloride, zolpidem  Assessment/Plan: Pyelonephritis/UTI Continue levofloxacin.  Antibiotics since 10/23/2011.  Ceftriaxone given once in the ER.  Urine culture shows no growth.  Continue levofloxacin for 3 more days to complete an  empiric five-day course. Renal ultrasound on 10/23/2011 was normal.  Fever Secondary to pyelonephritis.  Resolved.  Breast cancer Patient gets her care at [facility].  On prophylactic dose of Lovenox to prevent any blood clots, patient reports she does not have a history of DVT or PE.  She did report that her port did clot in the past.  Generalized anxiety Status.  GERD Continue PPI.  Upper respiratory infection Likely due to viral/seasonal process.  Send for influenza panel  negative.  Levaquin should have adequate bacterial coverage.  Chest x-ray on 10/23/2011 did not show cardiopulmonary process.  Hypokalemia Replace as needed.  Anemia Drop in hemoglobin likely delusional.  Tachycardia No events on telemetry except PVCs.  Patient normally reports her heart rate in the 70s.  Maybe due to urinary tract infection. Instructed the patient to followup with her primary care physician for further care and management.  Patient's blood pressures are on the soft side.  Prophylaxis Lovenox.  Disposition Discharge the patient today.   LOS: 1 day  Jatara Huettner A, MD 10/24/2011, 11:20 AM

## 2011-10-29 ENCOUNTER — Ambulatory Visit (INDEPENDENT_AMBULATORY_CARE_PROVIDER_SITE_OTHER): Payer: 59 | Admitting: Internal Medicine

## 2011-10-29 ENCOUNTER — Encounter: Payer: Self-pay | Admitting: Internal Medicine

## 2011-10-29 VITALS — BP 120/74 | HR 96 | Temp 99.0°F | Ht 68.0 in | Wt 181.0 lb

## 2011-10-29 DIAGNOSIS — R000 Tachycardia, unspecified: Secondary | ICD-10-CM

## 2011-10-29 DIAGNOSIS — R509 Fever, unspecified: Secondary | ICD-10-CM

## 2011-10-29 DIAGNOSIS — E876 Hypokalemia: Secondary | ICD-10-CM

## 2011-10-29 LAB — BASIC METABOLIC PANEL
CO2: 31 mEq/L (ref 19–32)
Calcium: 9 mg/dL (ref 8.4–10.5)
Creatinine, Ser: 0.7 mg/dL (ref 0.4–1.2)
GFR: 100.42 mL/min (ref >60.00)
Glucose, Bld: 98 mg/dL (ref 70–99)
Sodium: 139 mEq/L (ref 135–145)

## 2011-10-29 LAB — CBC WITH DIFFERENTIAL/PLATELET
Basophils Absolute: 0 10*3/uL (ref 0.0–0.1)
Eosinophils Absolute: 0.1 10*3/uL (ref 0.0–0.7)
Lymphocytes Relative: 19.9 % (ref 12.0–46.0)
MCHC: 32.5 g/dL (ref 30.0–36.0)
Monocytes Relative: 7.6 % (ref 3.0–12.0)
Neutrophils Relative %: 69.7 % (ref 43.0–77.0)
RDW: 15.8 % — ABNORMAL HIGH (ref 11.5–14.6)

## 2011-10-29 LAB — CULTURE, BLOOD (ROUTINE X 2)
Culture  Setup Time: 201305300908
Culture: NO GROWTH

## 2011-10-29 LAB — MAGNESIUM: Magnesium: 2 mg/dL (ref 1.5–2.5)

## 2011-10-29 NOTE — Assessment & Plan Note (Signed)
Mild tachycardia likely related to recent viral illness.  Increase fluid intake.   Mild hypokalemia before hospital discharge. Monitor electrolytes and magnesium level.

## 2011-10-29 NOTE — Patient Instructions (Signed)
Call if you develop a fever or worsening cough Increase your fluid intake Our office will contact you re: blood test results

## 2011-10-29 NOTE — Assessment & Plan Note (Signed)
56 year old white female with history of breast cancer immunocompromised due to chemotherapy was recently admitted for febrile illness. No evidence of bacterial infection. She likely has resolving viral URI. Patient to continue to monitor pulse and for worsening cough. Continue supportive care for now. Repeat CBC differential, electrolytes and kidney function. Patient advised to get plenty of rest and increase fluid intake.

## 2011-10-29 NOTE — Progress Notes (Signed)
Subjective:    Patient ID: [name], female    DOB: 14-Apr-1956, 56 y.o.   MRN: [id number]  HPI  56 year old white female with history of breast cancer for hospital followup. She was hospitalized on 10/23/2010 after experiencing sore throat, headache fever and sinus pressure. Due to her immunocompromised state there was concern of possible sepsis or urosepsis since she was treated for 2 UTIs one month ago. Her workup included chest x-ray which was negative for acute airspace disease. Her white count was 9.7. Her blood and urine cultures were negative. Renal ultrasound was negative for possible pyelonephritis. Since hospital discharge patient is feeling somewhat better. She still has mild tachycardia but denies fever or chills.  Review of Systems Negative for urinary symptoms.  She was seen by her gynecologist since previous visit. She was started on VESIcare for possible overactive bladder.  Symptoms of urinary urgency have greatly improved.  Past Medical History  Diagnosis Date  . Breast cancer 06/2007  . Allergy   . Elevated blood pressure reading without diagnosis of hypertension     monitor due to Avastin treatments    History   Social History  . Marital Status: Married    Spouse Name: N/A    Number of Children: 3  . Years of Education: N/A   Occupational History  . 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 Smoking status: Never Smoker   . Smokeless tobacco: Never Used  . Alcohol Use: No  . Drug Use: No  . Sexually Active: Not on file   Other Topics Concern  . 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    Past Surgical History  Procedure Date  . Breast surgery 2009    biposy. bilateral mastectomy. Breast cancer HER 2, chest wall. Mets 04/2008    Family History  Problem Relation Age of Onset  . Heart disease Mother       due to mitral valve regurgiation,   . Cancer Mother     breast  . Parkinsonism Father   . Stroke Sister   . Alcohol abuse Paternal Grandfather     Allergies  Allergen Reactions  . Tape Hives    Can tolerate paper tape  . Dilaudid (Hydromorphone Hcl) Nausea And Vomiting  . Penicillins Rash    Denies airway involvement    Current Outpatient Prescriptions on File Prior to Visit  Medication Sig Dispense Refill  . Ado-Trastuzumab Emtansine (KADCYLA IV) Inject into the vein. 2.5mg /kg      . celecoxib (CELEBREX) 200 MG capsule Take 200 mg by mouth 2 (two) times daily.        . cetirizine (ZYRTEC) 10 MG tablet Take 10 mg by mouth daily.        . cholecalciferol (VITAMIN D) 400 UNITS TABS Take 2,000 Units by mouth 3 (three) times a week. Mon, Wed, and Fri      . Enoxaparin Sodium (LOVENOX IJ) Inject 70 mcg as directed daily.       [name] gabapentin (NEURONTIN) 100 MG capsule One to two caps in the evening as directed  60 capsule  1  . ibuprofen (ADVIL,MOTRIN) 200 MG tablet Take 400 mg by mouth every 6 (six) hours as needed. Pain      . LORazepam (ATIVAN) 0.5 MG tablet Take 1 tablet (0.5 mg  total) by mouth daily as needed for anxiety (currently using less than 2x per week (06/16/11)).      [name] omeprazole (PRILOSEC) 20 MG capsule Take 20 mg by mouth daily as needed.       . ondansetron (ZOFRAN) 8 MG tablet Take 1 tablet (8 mg total) by mouth every 8 (eight) hours as needed for nausea.      [name] zolpidem (AMBIEN) 5 MG tablet Take 5 mg by mouth at bedtime as needed.        BP 120/74  Pulse 96  Temp(Src) 99 F (37.2 C) (Oral)  Ht 5\' 8"  (1.727 m)  Wt 181 lb (82.101 kg)  BMI 27.52 kg/m2       Objective:   Physical Exam  Constitutional: She is oriented to person, place, and time. She appears well-developed and well-nourished.  HENT:  Head: Normocephalic and atraumatic.  Mouth/Throat: Oropharynx is clear and moist.  Cardiovascular: Normal rate and normal heart sounds.        Occasional  ectopic beat  Pulmonary/Chest: Effort normal.       Faint coarse breath sounds at bases  Neurological: She is alert and oriented to person, place, and time.  Skin: Skin is warm and dry.  Psychiatric: She has a normal mood and affect. Her behavior is normal.       Assessment & Plan:

## 2011-11-03 ENCOUNTER — Other Ambulatory Visit: Payer: Self-pay | Admitting: Internal Medicine

## 2011-11-04 ENCOUNTER — Telehealth (HOSPITAL_COMMUNITY): Payer: Self-pay | Admitting: *Deleted

## 2011-11-04 DIAGNOSIS — C50919 Malignant neoplasm of unspecified site of unspecified female breast: Secondary | ICD-10-CM

## 2011-11-04 NOTE — Telephone Encounter (Signed)
Per Dr Gala Romney pt needs echo

## 2011-11-18 ENCOUNTER — Ambulatory Visit (HOSPITAL_COMMUNITY): Payer: 59 | Attending: Internal Medicine | Admitting: Radiology

## 2011-11-18 DIAGNOSIS — I379 Nonrheumatic pulmonary valve disorder, unspecified: Secondary | ICD-10-CM | POA: Insufficient documentation

## 2011-11-18 DIAGNOSIS — C50919 Malignant neoplasm of unspecified site of unspecified female breast: Secondary | ICD-10-CM

## 2011-11-18 DIAGNOSIS — I059 Rheumatic mitral valve disease, unspecified: Secondary | ICD-10-CM | POA: Insufficient documentation

## 2011-11-18 DIAGNOSIS — I428 Other cardiomyopathies: Secondary | ICD-10-CM

## 2011-11-18 DIAGNOSIS — I079 Rheumatic tricuspid valve disease, unspecified: Secondary | ICD-10-CM | POA: Insufficient documentation

## 2011-11-18 NOTE — Progress Notes (Signed)
Echocardiogram performed.  

## 2011-12-12 ENCOUNTER — Ambulatory Visit (INDEPENDENT_AMBULATORY_CARE_PROVIDER_SITE_OTHER): Payer: 59 | Admitting: Psychology

## 2011-12-12 DIAGNOSIS — F4320 Adjustment disorder, unspecified: Secondary | ICD-10-CM

## 2012-01-09 ENCOUNTER — Ambulatory Visit (HOSPITAL_BASED_OUTPATIENT_CLINIC_OR_DEPARTMENT_OTHER): Admission: RE | Admit: 2012-01-09 | Payer: 59 | Source: Ambulatory Visit | Admitting: Specialist

## 2012-01-09 ENCOUNTER — Encounter (HOSPITAL_BASED_OUTPATIENT_CLINIC_OR_DEPARTMENT_OTHER): Admission: RE | Payer: Self-pay | Source: Ambulatory Visit

## 2012-01-09 SURGERY — ARTHROSCOPY, KNEE
Anesthesia: General | Laterality: Left

## 2012-02-10 ENCOUNTER — Encounter (HOSPITAL_BASED_OUTPATIENT_CLINIC_OR_DEPARTMENT_OTHER): Payer: Self-pay | Admitting: *Deleted

## 2012-02-10 ENCOUNTER — Other Ambulatory Visit: Payer: Self-pay | Admitting: Specialist

## 2012-02-10 NOTE — Progress Notes (Signed)
NPO AFTER MN. ARRIVES AT 0645. PRE-OP ORDERS PENDING. BUT ORDERED ISTAT AND EKG PER ANES.

## 2012-02-13 ENCOUNTER — Encounter (HOSPITAL_BASED_OUTPATIENT_CLINIC_OR_DEPARTMENT_OTHER)
Admission: RE | Disposition: A | Discharge status: Home / Self Care | Payer: Self-pay | Source: Ambulatory Visit | Attending: Specialist

## 2012-02-13 ENCOUNTER — Encounter (HOSPITAL_BASED_OUTPATIENT_CLINIC_OR_DEPARTMENT_OTHER): Payer: Self-pay | Admitting: Anesthesiology

## 2012-02-13 ENCOUNTER — Encounter (HOSPITAL_BASED_OUTPATIENT_CLINIC_OR_DEPARTMENT_OTHER): Payer: Self-pay | Admitting: *Deleted

## 2012-02-13 ENCOUNTER — Ambulatory Visit (HOSPITAL_BASED_OUTPATIENT_CLINIC_OR_DEPARTMENT_OTHER): Payer: 59 | Admitting: Anesthesiology

## 2012-02-13 ENCOUNTER — Ambulatory Visit (HOSPITAL_BASED_OUTPATIENT_CLINIC_OR_DEPARTMENT_OTHER)
Admission: RE | Admit: 2012-02-13 | Discharge: 2012-02-13 | Disposition: A | Discharge status: Home / Self Care | Payer: 59 | Source: Ambulatory Visit | Attending: Specialist | Admitting: Specialist

## 2012-02-13 DIAGNOSIS — C50919 Malignant neoplasm of unspecified site of unspecified female breast: Secondary | ICD-10-CM

## 2012-02-13 DIAGNOSIS — M1710 Unilateral primary osteoarthritis, unspecified knee: Secondary | ICD-10-CM | POA: Insufficient documentation

## 2012-02-13 DIAGNOSIS — M23302 Other meniscus derangements, unspecified lateral meniscus, unspecified knee: Secondary | ICD-10-CM | POA: Insufficient documentation

## 2012-02-13 DIAGNOSIS — S83289A Other tear of lateral meniscus, current injury, unspecified knee, initial encounter: Secondary | ICD-10-CM

## 2012-02-13 DIAGNOSIS — Z79899 Other long term (current) drug therapy: Secondary | ICD-10-CM | POA: Insufficient documentation

## 2012-02-13 DIAGNOSIS — M2240 Chondromalacia patellae, unspecified knee: Secondary | ICD-10-CM | POA: Insufficient documentation

## 2012-02-13 HISTORY — PX: KNEE ARTHROSCOPY: SHX127

## 2012-02-13 HISTORY — PX: KNEE ARTHROSCOPY WITH LATERAL MENISECTOMY: SHX6193

## 2012-02-13 HISTORY — DX: Long term (current) use of anticoagulants: Z79.01

## 2012-02-13 HISTORY — DX: Unspecified osteoarthritis, unspecified site: M19.90

## 2012-02-13 HISTORY — DX: Personal history of malignant neoplasm of breast: Z85.3

## 2012-02-13 LAB — POCT I-STAT 4, (NA,K, GLUC, HGB,HCT)
Potassium: 3.8 mEq/L (ref 3.5–5.1)
Sodium: 140 mEq/L (ref 135–145)

## 2012-02-13 SURGERY — ARTHROSCOPY, KNEE
Anesthesia: General | Site: Knee | Laterality: Left

## 2012-02-13 MED ORDER — PROPOFOL 10 MG/ML IV BOLUS
INTRAVENOUS | Status: DC | PRN
  Administered 2012-02-13: 300 mg via INTRAVENOUS

## 2012-02-13 MED ORDER — ONDANSETRON HCL 4 MG/2ML IJ SOLN
INTRAMUSCULAR | Status: DC | PRN
  Administered 2012-02-13: 4 mg via INTRAVENOUS

## 2012-02-13 MED ORDER — HYDROCODONE-ACETAMINOPHEN 5-325 MG PO TABS: 1.0000 | ORAL_TABLET | ORAL | Status: DC | PRN

## 2012-02-13 MED ORDER — KETOROLAC TROMETHAMINE 30 MG/ML IJ SOLN
INTRAMUSCULAR | Status: DC | PRN
  Administered 2012-02-13: 30 mg via INTRAVENOUS

## 2012-02-13 MED ORDER — FENTANYL CITRATE 0.05 MG/ML IJ SOLN: 25.0000 ug | INTRAMUSCULAR | Status: DC | PRN

## 2012-02-13 MED ORDER — LACTATED RINGERS IV SOLN: INTRAVENOUS | Status: DC

## 2012-02-13 MED ORDER — KETOROLAC TROMETHAMINE 30 MG/ML IJ SOLN: 15.0000 mg | Freq: Once | INTRAMUSCULAR | Status: DC | PRN

## 2012-02-13 MED ORDER — CEFAZOLIN SODIUM-DEXTROSE 2-3 GM-% IV SOLR: 2.0000 g | INTRAVENOUS | Status: DC

## 2012-02-13 MED ORDER — CHLORHEXIDINE GLUCONATE 4 % EX LIQD: 60.0000 mL | Freq: Once | CUTANEOUS | Status: DC

## 2012-02-13 MED ORDER — FENTANYL CITRATE 0.05 MG/ML IJ SOLN
INTRAMUSCULAR | Status: DC | PRN
  Administered 2012-02-13 (×2): 50 ug via INTRAVENOUS
  Administered 2012-02-13 (×2): 25 ug via INTRAVENOUS
  Administered 2012-02-13: 50 ug via INTRAVENOUS

## 2012-02-13 MED ORDER — CEFAZOLIN SODIUM-DEXTROSE 2-3 GM-% IV SOLR
INTRAVENOUS | Status: DC | PRN
  Administered 2012-02-13: 2 g via INTRAVENOUS

## 2012-02-13 MED ORDER — SODIUM CHLORIDE 0.9 % IR SOLN
Status: DC | PRN
  Administered 2012-02-13: 09:00:00

## 2012-02-13 MED ORDER — BUPIVACAINE-EPINEPHRINE 0.5% -1:200000 IJ SOLN
INTRAMUSCULAR | Status: DC | PRN
  Administered 2012-02-13: 20 mL

## 2012-02-13 MED ORDER — LACTATED RINGERS IV SOLN
INTRAVENOUS | Status: DC | PRN
  Administered 2012-02-13 (×2): via INTRAVENOUS

## 2012-02-13 MED ORDER — CLINDAMYCIN PHOSPHATE 600 MG/50ML IV SOLN
INTRAVENOUS | Status: DC | PRN
  Administered 2012-02-13: 900 mg via INTRAVENOUS

## 2012-02-13 MED ORDER — DEXAMETHASONE SODIUM PHOSPHATE 4 MG/ML IJ SOLN
INTRAMUSCULAR | Status: DC | PRN
  Administered 2012-02-13: 10 mg via INTRAVENOUS

## 2012-02-13 MED ORDER — LACTATED RINGERS IV SOLN
INTRAVENOUS | Status: DC
  Administered 2012-02-13: 08:00:00 via INTRAVENOUS

## 2012-02-13 MED ORDER — LIDOCAINE HCL (CARDIAC) 20 MG/ML IV SOLN
INTRAVENOUS | Status: DC | PRN
  Administered 2012-02-13: 100 mg via INTRAVENOUS

## 2012-02-13 MED ORDER — PROMETHAZINE HCL 25 MG/ML IJ SOLN: 6.2500 mg | INTRAMUSCULAR | Status: DC | PRN

## 2012-02-13 MED ORDER — MIDAZOLAM HCL 5 MG/5ML IJ SOLN
INTRAMUSCULAR | Status: DC | PRN
  Administered 2012-02-13: 2 mg via INTRAVENOUS

## 2012-02-13 SURGICAL SUPPLY — 48 items
BANDAGE ELASTIC 6 VELCRO ST LF (GAUZE/BANDAGES/DRESSINGS) ×3 IMPLANT
BLADE 4.2CUDA (BLADE) IMPLANT
BLADE CUDA SHAVER 3.5 (BLADE) ×3 IMPLANT
BLADE GREAT WHITE 4.2 (BLADE) IMPLANT
BOOTIES KNEE HIGH SLOAN (MISCELLANEOUS) ×3 IMPLANT
CANISTER SUCT LVC 12 LTR MEDI- (MISCELLANEOUS) ×3 IMPLANT
CANISTER SUCTION 1200CC (MISCELLANEOUS) IMPLANT
CANISTER SUCTION 2500CC (MISCELLANEOUS) IMPLANT
CANNULA ACUFLEX KIT 5X76 (CANNULA) IMPLANT
CLOTH BEACON ORANGE TIMEOUT ST (SAFETY) ×3 IMPLANT
CUTTER MENISCUS  4.2MM (BLADE)
CUTTER MENISCUS 4.2MM (BLADE) IMPLANT
DRAPE ARTHROSCOPY W/POUCH 114 (DRAPES) ×3 IMPLANT
DRSG EMULSION OIL 3X3 NADH (GAUZE/BANDAGES/DRESSINGS) ×1 IMPLANT
DRSG PAD ABDOMINAL 8X10 ST (GAUZE/BANDAGES/DRESSINGS) ×1 IMPLANT
DURAPREP 26ML APPLICATOR (WOUND CARE) ×3 IMPLANT
ELECT REM PT RETURN 9FT ADLT (ELECTROSURGICAL)
ELECTRODE REM PT RTRN 9FT ADLT (ELECTROSURGICAL) IMPLANT
GLOVE BIOGEL PI IND STRL 6.5 (GLOVE) IMPLANT
GLOVE BIOGEL PI IND STRL 7.0 (GLOVE) ×2 IMPLANT
GLOVE BIOGEL PI INDICATOR 6.5 (GLOVE) ×2
GLOVE BIOGEL PI INDICATOR 7.0 (GLOVE) ×1
GLOVE SURG SS PI 8.0 STRL IVOR (GLOVE) ×3 IMPLANT
GOWN PREVENTION PLUS LG XLONG (DISPOSABLE) ×3 IMPLANT
GOWN STRL REIN XL XLG (GOWN DISPOSABLE) ×3 IMPLANT
IV NS IRRIG 3000ML ARTHROMATIC (IV SOLUTION) ×6 IMPLANT
KNEE WRAP E Z 3 GEL PACK (MISCELLANEOUS) ×3 IMPLANT
MINI VAC (SURGICAL WAND) IMPLANT
NDL FILTER BLUNT 18X1 1/2 (NEEDLE) ×2 IMPLANT
NDL SAFETY ECLIPSE 18X1.5 (NEEDLE) ×3 IMPLANT
NEEDLE FILTER BLUNT 18X 1/2SAF (NEEDLE)
NEEDLE FILTER BLUNT 18X1 1/2 (NEEDLE) IMPLANT
NEEDLE HYPO 18GX1.5 SHARP (NEEDLE) ×3
PACK ARTHROSCOPY DSU (CUSTOM PROCEDURE TRAY) ×3 IMPLANT
PACK BASIN DAY SURGERY FS (CUSTOM PROCEDURE TRAY) ×3 IMPLANT
PAD CAST 4YDX4 CTTN HI CHSV (CAST SUPPLIES) IMPLANT
PADDING CAST COTTON 4X4 STRL (CAST SUPPLIES) ×3
PADDING CAST COTTON 6X4 STRL (CAST SUPPLIES) ×3 IMPLANT
SET ARTHROSCOPY TUBING (MISCELLANEOUS) ×3
SET ARTHROSCOPY TUBING LN (MISCELLANEOUS) ×2 IMPLANT
SPONGE GAUZE 4X4 12PLY (GAUZE/BANDAGES/DRESSINGS) ×3 IMPLANT
SUT ETHILON 4 0 PS 2 18 (SUTURE) ×3 IMPLANT
SYR 30ML LL (SYRINGE) ×3 IMPLANT
SYRINGE 10CC LL (SYRINGE) ×3 IMPLANT
TAPE STRIPS DRAPE STRL (GAUZE/BANDAGES/DRESSINGS) ×2 IMPLANT
TOWEL OR 17X24 6PK STRL BLUE (TOWEL DISPOSABLE) ×3 IMPLANT
WAND 90 DEG TURBOVAC W/CORD (SURGICAL WAND) ×1 IMPLANT
WATER STERILE IRR 500ML POUR (IV SOLUTION) ×3 IMPLANT

## 2012-02-13 NOTE — H&P (Signed)
[name] is an 56 y.o. female.   Chief Complaint: left knee pain  HPI: Meniscus tear left knee refractory with locking  Past Medical History  Diagnosis Date  . Elevated blood pressure reading without diagnosis of hypertension     monitor due to Avastin treatments  . 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 Acute meniscal tear of knee LEFT KNEE  . Arthritis KNEES  . Skin cancer of anterior chest BREAST CANCER PRIMARY W/ METS TO CHEST WALL SKIN CANCER--  CHEMOTHERAPY EVERY 3 WEEKS AT [facility]  . 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 Chronic anticoagulation HX  PORT-A-CATH CLOT    Past Surgical History  Procedure Date  . Left modified radical mastectomy/ right total mastectomy 11-13-2007    LEFT BREAST CANCER W/ AXILLARY LYMPH NODE METASTASIS AND POST NEOADJUVANT CHEMO  . Placement port-a-cath 06-22-2007  . Transthoracic echocardiogram 11-18-2011  DR BENSIMHON (ECHO EVERY 3 MONTHS)    HX CHEMO INDUCED CARDIOTOXICITY/  LVSF NORMAL/ EF 55-50%/ MILD MITRIAL VALVE REGURG./ MILDLY INCREASED SYSTOLIC PRESSURE OF PULMONARY ARTERIES    Family History  Problem Relation Age of Onset  . Heart disease Mother     due to mitral valve regurgiation,   . Cancer Mother     breast  . Parkinsonism Father   . Stroke Sister   . Alcohol abuse Paternal Grandfather    Social History:  reports that she has never smoked. She has never used smokeless tobacco. She reports that she does not drink alcohol or use illicit drugs.  Allergies:  Allergies  Allergen Reactions  . Tape Hives    Can tolerate paper tape  . Dilaudid (Hydromorphone Hcl) Nausea And Vomiting  . Penicillins Rash    Denies airway involvement    Medications Prior to Admission  Medication Sig Dispense Refill  . acetaminophen (TYLENOL) 500 MG tablet Take 500 mg by mouth every 6 (six) hours as needed.      . Ado-Trastuzumab  Emtansine (KADCYLA IV) Inject into the vein. 2.5mg /kg/  CHEMOTHERAPY DRUG GIVEN EVERY 3 WEEKS  AT [facility]      . celecoxib (CELEBREX) 200 MG capsule Take 200 mg by mouth 2 (two) times daily.       . cetirizine (ZYRTEC) 10 MG tablet Take 10 mg by mouth daily as needed.       . Cholecalciferol (VITAMIN D3) 2000 UNITS TABS Take by mouth 3 (three) times a week.      . darifenacin (ENABLEX) 15 MG 24 hr tablet Take 15 mg by mouth daily.      . Enoxaparin Sodium (LOVENOX IJ) Inject 70 mcg as directed daily. PT INSTRUCTED TO DO 50 MCG INJECTION DAY BEFORE SURGERY AND NO INJECTION DAY OF SURGERY, WHICH IS 02-13-2012      . gabapentin (NEURONTIN) 100 MG capsule       . ibuprofen (ADVIL,MOTRIN) 200 MG tablet Take 400 mg by mouth every 6 (six) hours as needed. Pain      . LORazepam (ATIVAN) 0.5 MG tablet Take 1 tablet (0.5 mg total) by mouth daily as needed for anxiety (currently using less than 2x per week (06/16/11)).      [name] ondansetron (ZOFRAN) 8 MG tablet Take 1 tablet (8 mg total) by mouth every 8 (eight) hours as needed for nausea.      [name] zolpidem (AMBIEN) 5 MG  tablet Take 5 mg by mouth at bedtime as needed.      [name] omeprazole (PRILOSEC) 20 MG capsule Take 20 mg by mouth daily as needed.         No results found for this or any previous visit (from the past 48 hour(s)). No results found.  Review of Systems  Musculoskeletal: Positive for joint pain.  All other systems reviewed and are negative.    Blood pressure 119/75, pulse 84, temperature 97.7 F (36.5 C), temperature source Oral, resp. rate 18, height 5\' 8"  (1.727 m), weight 80.74 kg (178 lb), last menstrual period 06/22/2007, SpO2 99.00%. Physical Exam  Vitals reviewed. Constitutional: She appears well-developed.  HENT:  Head: Normocephalic.  Eyes: Pupils are equal, round, and reactive to light.  Neck: Normal range of motion.  Cardiovascular: Normal rate.   Respiratory: Effort normal.  GI: Soft.  Musculoskeletal:       MJLT,  LJLT, PF pain, effusion left knee  Skin: Skin is warm and dry.  Psychiatric: She has a normal mood and affect.   MRI lateral meniscus tear DJD left knee  Assessment/Plan Sx lateral meniscus tear ,DJD left knee. Plan arthroscopy. Risks discussed. lovenox postop.  [name] C 02/13/2012, 7:40 AM

## 2012-02-13 NOTE — Anesthesia Postprocedure Evaluation (Signed)
  Anesthesia Post-op Note  Patient: [name]  Procedure(s) Performed: Procedure(s) (LRB): ARTHROSCOPY KNEE (Left) KNEE ARTHROSCOPY WITH LATERAL MENISECTOMY ()  Patient Location: PACU  Anesthesia Type: General  Level of Consciousness: awake and alert   Airway and Oxygen Therapy: Patient Spontanous Breathing  Post-op Pain: mild  Post-op Assessment: Post-op Vital signs reviewed, Patient's Cardiovascular Status Stable, Respiratory Function Stable, Patent Airway and No signs of Nausea or vomiting  Post-op Vital Signs: stable  Complications: No apparent anesthesia complications

## 2012-02-13 NOTE — Brief Op Note (Signed)
02/13/2012  9:36 AM  PATIENT:  [name]  56 y.o. female  PRE-OPERATIVE DIAGNOSIS:  MENISCAL TEAR ON LEFT   POST-OPERATIVE DIAGNOSIS:  MENISCAL TEAR ON LEFT   PROCEDURE:  Procedure(s) (LRB) with comments: ARTHROSCOPY KNEE (Left) - WITH DEBRIDEMENt  KNEE ARTHROSCOPY WITH LATERAL MENISECTOMY () - partial  SURGEON:  Surgeon(s) and Role:    * Javier Docker, MD - Primary  PHYSICIAN ASSISTANT:   ASSISTANTS: none   ANESTHESIA:   general  EBL:  Total I/O In: 1000 [I.V.:1000] Out: -   BLOOD ADMINISTERED:none  DRAINS: none   LOCAL MEDICATIONS USED:  MARCAINE     SPECIMEN:  No Specimen  DISPOSITION OF SPECIMEN:  N/A  COUNTS:  YES  TOURNIQUET:  * No tourniquets in log *  DICTATION: .Other Dictation: Dictation Number [id number]  PLAN OF CARE: Discharge to home after PACU  PATIENT DISPOSITION:  PACU - hemodynamically stable.   Delay start of Pharmacological VTE agent (>24hrs) due to surgical blood loss or risk of bleeding: no

## 2012-02-13 NOTE — Anesthesia Preprocedure Evaluation (Signed)
Anesthesia Evaluation  Patient identified by MRN, date of birth, ID band Patient awake    Reviewed: Allergy & Precautions, H&P , NPO status , Patient's Chart, lab work & pertinent test results  Airway Mallampati: II TM Distance: <3 FB Neck ROM: Full    Dental No notable dental hx.    Pulmonary neg pulmonary ROS,  breath sounds clear to auscultation  Pulmonary exam normal       Cardiovascular hypertension, Rhythm:Regular Rate:Normal     Neuro/Psych negative neurological ROS  negative psych ROS   GI/Hepatic negative GI ROS, Neg liver ROS,   Endo/Other  negative endocrine ROS  Renal/GU negative Renal ROS  negative genitourinary   Musculoskeletal negative musculoskeletal ROS (+)   Abdominal   Peds negative pediatric ROS (+)  Hematology negative hematology ROS (+)   Anesthesia Other Findings   Reproductive/Obstetrics negative OB ROS                           Anesthesia Physical Anesthesia Plan  ASA: II  Anesthesia Plan: General   Post-op Pain Management:    Induction: Intravenous  Airway Management Planned: LMA  Additional Equipment:   Intra-op Plan:   Post-operative Plan:   Informed Consent: I have reviewed the patients History and Physical, chart, labs and discussed the procedure including the risks, benefits and alternatives for the proposed anesthesia with the patient or authorized representative who has indicated his/her understanding and acceptance.   Dental advisory given  Plan Discussed with: CRNA and Surgeon  Anesthesia Plan Comments:         Anesthesia Quick Evaluation

## 2012-02-13 NOTE — Transfer of Care (Signed)
Immediate Anesthesia Transfer of Care Note  Patient: [name]  Procedure(s) Performed: Procedure(s) (LRB): ARTHROSCOPY KNEE (Left) KNEE ARTHROSCOPY WITH LATERAL MENISECTOMY ()  Patient Location: PACU  Anesthesia Type: General  Level of Consciousness: awake, sedated, patient cooperative and responds to stimulation  Airway & Oxygen Therapy: Patient Spontanous Breathing and Patient connected to face mask oxygen  Post-op Assessment: Report given to PACU RN, Post -op Vital signs reviewed and stable and Patient moving all extremities  Post vital signs: Reviewed and stable  Complications: No apparent anesthesia complications

## 2012-02-16 ENCOUNTER — Encounter (HOSPITAL_BASED_OUTPATIENT_CLINIC_OR_DEPARTMENT_OTHER): Payer: Self-pay | Admitting: Specialist

## 2012-02-16 NOTE — Op Note (Signed)
[name], [name] NO.:  [id number]  MEDICAL RECORD NO.:  [id number]  LOCATION:                                FACILITY:  [facility]  PHYSICIAN:  Jene Every, M.D.    DATE OF BIRTH:  May 23, 1956  DATE OF PROCEDURE:  02/13/2012 DATE OF DISCHARGE:                              OPERATIVE REPORT   PREOPERATIVE DIAGNOSES:  Degenerative joint disease, lateral meniscus tear, patellofemoral arthrosis.  POSTOPERATIVE DIAGNOSES:  Degenerative joint disease, lateral meniscus tear, patellofemoral arthrosis.  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  Grade 3 changes in the medial compartment.  Tear in the lateral meniscus.  PROCEDURE PERFORMED: 1. Left knee arthroscopy. 2. Partial lateral meniscectomy. 3. Tricompartmental debridement, chondroplasty of lateral femoral     condyle, patella.  ANESTHESIA:  General.  ASSISTANT:  No assistant.  HISTORY:  This is a 56 year old female with a history of breast CA, who has severe osteoarthrosis of knee, locking, popping, and giving way. Lateral meniscus tear and severe degenerative changes noted on radiographs.  Refractory to conservative treatment including rest, activity modification, corticosteroid injection.  She was indicated for arthroscopic debridement and partial meniscectomy with risks and benefits including bleeding, infection, damage to neurovascular structures, DVT, PE, anesthetic complications, no change in symptoms, worsening symptoms, and possible total knee in the future.  TECHNIQUE:  With the patient in supine position, after induction of adequate general anesthesia, 2 g Kefzol and 900 clindamycin due to history of a chemotherapy and possible immune compromise, left lower extremity was prepped and draped in usual sterile fashion.  A lateral parapatellar portal was fashioned with a #11 blade.  An ingress cannula was atraumatically placed.   Irrigant was utilized to insufflate the joint.  Under direct visualization, medial parapatellar portal was fashioned with a #11 blade after localization with 18-gauge needle sparing the medial meniscus.  The medial compartment revealed some grade 3 changes of the femoral condyle, extensively some osteophytic spurring. A light chondroplasty was performed here.  The meniscus was stable to probe palpation with light shaving of the meniscus.  ACL was attenuated and frayed.  Partial tear less than 20%.  The lateral compartment with some grade 4 changes.  Femoral condyle, tibial plateau, degenerative meniscus tear that was infolded and the lateral compartment complex tearing from the midportion anteriorly.  It was still attached anteriorly.  Combination of the shaver and Arthur wand was utilized to contour it and performed partial meniscectomy to a stable base.  We shaved the remaining part of the meniscus as well.  Light chondroplasty was performed of chondral flap tears.  There extensive grade 4 changes were seen.  Suprapatellar pouch with extensive grade 3 and grade 4 changes.  Light chondroplasty performed here, slight lateral tracking.  Copiously lavaged knee.  All compartments revisited.  No further pathology amenable to arthroscopic intervention.  I therefore removed all instrumentation.  Portals were closed with 4-0 nylon simple sutures. 0.25% Marcaine with epinephrine was infiltrated in the joint.  Wound was dressed sterilely.  Awoken without difficulty and transported to the recovery room in satisfactory condition.  The patient tolerated the procedure well.  No complications.  No assistant.  Minimal blood loss.     Jene Every, M.D.     Cordelia Pen  D:  02/13/2012  T:  02/14/2012  Job:  [id number]

## 2012-02-19 ENCOUNTER — Encounter (HOSPITAL_BASED_OUTPATIENT_CLINIC_OR_DEPARTMENT_OTHER): Payer: Self-pay

## 2012-02-25 ENCOUNTER — Encounter: Payer: Self-pay | Admitting: *Deleted

## 2012-02-25 ENCOUNTER — Emergency Department (INDEPENDENT_AMBULATORY_CARE_PROVIDER_SITE_OTHER)
Admission: EM | Admit: 2012-02-25 | Discharge: 2012-02-25 | Disposition: A | Discharge status: Home / Self Care | Payer: 59 | Source: Home / Self Care | Attending: Family Medicine | Admitting: Family Medicine

## 2012-02-25 DIAGNOSIS — N3000 Acute cystitis without hematuria: Secondary | ICD-10-CM

## 2012-02-25 DIAGNOSIS — R300 Dysuria: Secondary | ICD-10-CM

## 2012-02-25 LAB — POCT URINALYSIS DIP (MANUAL ENTRY)
Bilirubin, UA: NEGATIVE
Glucose, UA: NEGATIVE
Ketones, POC UA: NEGATIVE
Spec Grav, UA: 1.01 (ref 1.005–1.03)

## 2012-02-25 MED ORDER — SULFAMETHOXAZOLE-TRIMETHOPRIM 800-160 MG PO TABS: 1.0000 | ORAL_TABLET | Freq: Two times a day (BID) | ORAL | Status: DC

## 2012-02-25 NOTE — ED Notes (Signed)
Patient c/o 3 days dysuria, urinary frequency and bladder cramping. No OTC meds taken.

## 2012-02-25 NOTE — ED Provider Notes (Signed)
History     CSN: [id number]  Arrival date & time 02/25/12  0840   First MD Initiated Contact with Patient 02/25/12 [phone]      Chief Complaint  Patient presents with  . Dysuria  . Urinary Frequency      HPI Comments: Patient c/o 3 days dysuria, urinary frequency and bladder cramping. No OTC meds taken.   Patient is a 56 y.o. female presenting with dysuria. The history is provided by the patient.  Dysuria  This is a new problem. Episode onset: 3 days ago. The problem occurs every urination. The problem has been gradually worsening. The quality of the pain is described as burning. The pain is mild. There has been no fever. Associated symptoms include frequency, hesitancy and urgency. Pertinent negatives include no chills, no sweats, no nausea, no vomiting, no discharge, no hematuria and no flank pain. She has tried nothing for the symptoms.    Past Medical History  Diagnosis Date  . Elevated blood pressure reading without diagnosis of hypertension     monitor due to Avastin treatments  . 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 Acute meniscal tear of knee LEFT KNEE  . Arthritis KNEES  . Skin cancer of anterior chest BREAST CANCER PRIMARY W/ METS TO CHEST WALL SKIN CANCER--  CHEMOTHERAPY EVERY 3 WEEKS AT [facility]  . 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 Chronic anticoagulation HX  PORT-A-CATH CLOT    Past Surgical History  Procedure Date  . Left modified radical mastectomy/ right total mastectomy 11-13-2007    LEFT BREAST CANCER W/ AXILLARY LYMPH NODE METASTASIS AND POST NEOADJUVANT CHEMO  . Placement port-a-cath 06-22-2007  . Transthoracic echocardiogram 11-18-2011  DR BENSIMHON (ECHO EVERY 3 MONTHS)    HX CHEMO INDUCED CARDIOTOXICITY/  LVSF NORMAL/ EF 55-50%/ MILD MITRIAL VALVE REGURG./ MILDLY INCREASED SYSTOLIC PRESSURE OF PULMONARY ARTERIES  . Knee arthroscopy 02/13/2012     Procedure: ARTHROSCOPY KNEE;  Surgeon: Javier Docker, MD;  Location: [facility];  Service: Orthopedics;  Laterality: Left;  WITH DEBRIDEMENt     Family History  Problem Relation Age of Onset  . Heart disease Mother     due to mitral valve regurgiation,   . Cancer Mother     breast  . Parkinsonism Father   . Stroke Sister   . Alcohol abuse Paternal Grandfather     History  Substance Use Topics  . Smoking status: Never Smoker   . Smokeless tobacco: Never Used  . Alcohol Use: No    OB History    Grav Para Term Preterm Abortions TAB SAB Ect Mult Living                  Review of Systems  Constitutional: Negative for chills.  Gastrointestinal: Negative for nausea and vomiting.  Genitourinary: Positive for dysuria, hesitancy, urgency and frequency. Negative for hematuria and flank pain.  All other systems reviewed and are negative.    Allergies  Tape; Dilaudid; and Penicillins  Home Medications   Current Outpatient Rx  Name Route Sig Dispense Refill  . ACETAMINOPHEN 500 MG PO TABS Oral Take 500 mg by mouth every 6 (six) hours as needed.    [name] KADCYLA IV Intravenous Inject into the vein. 2.5mg /kg/  CHEMOTHERAPY DRUG GIVEN EVERY 3 WEEKS  AT [facility]    . CETIRIZINE HCL 10  MG PO TABS Oral Take 10 mg by mouth daily as needed.     [name] VITAMIN D3 2000 UNITS PO TABS Oral Take by mouth 3 (three) times a week.    [name] DARIFENACIN HYDROBROMIDE ER 15 MG PO TB24 Oral Take 15 mg by mouth daily.    [name] LOVENOX IJ Injection Inject 70 mcg as directed daily. PT INSTRUCTED TO DO 50 MCG INJECTION DAY BEFORE SURGERY AND NO INJECTION DAY OF SURGERY, WHICH IS 02-13-2012    . GABAPENTIN 100 MG PO CAPS      . HYDROCODONE-ACETAMINOPHEN 5-325 MG PO TABS Oral Take 1-2 tablets by mouth every 4 (four) hours as needed for pain. 40 tablet 2  . IBUPROFEN 200 MG PO TABS Oral Take 400 mg by mouth every 6 (six) hours as needed. Pain    . LORAZEPAM 0.5 MG PO TABS Oral Take 1 tablet (0.5  mg total) by mouth daily as needed for anxiety (currently using less than 2x per week (06/16/11)).    [name] OMEPRAZOLE 20 MG PO CPDR Oral Take 20 mg by mouth daily as needed.     [name] ONDANSETRON HCL 8 MG PO TABS Oral Take 1 tablet (8 mg total) by mouth every 8 (eight) hours as needed for nausea.    . SULFAMETHOXAZOLE-TRIMETHOPRIM 800-160 MG PO TABS Oral Take 1 tablet by mouth 2 (two) times daily. 10 tablet 0  . ZOLPIDEM TARTRATE 5 MG PO TABS Oral Take 5 mg by mouth at bedtime as needed.      BP 113/74  Pulse 78  Temp 98.2 F (36.8 C) (Oral)  Resp 16  Ht 5\' 8"  (1.727 m)  Wt 180 lb (81.647 kg)  BMI 27.37 kg/m2  SpO2 96%  LMP 06/22/2007  Physical Exam Nursing notes and Vital Signs reviewed. Appearance:  Patient appears healthy, stated age, and in no acute distress Eyes:  Pupils are equal, round, and reactive to light and accomodation.  Extraocular movement is intact.  Conjunctivae are not inflamed  Pharynx:  Normal Neck:  Supple.   No adenopathy Lungs:  Clear to auscultation.  Breath sounds are equal.  Heart:  Regular rate and rhythm without murmurs, rubs, or gallops.  Abdomen:  Nontender without masses or hepatosplenomegaly.  Bowel sounds are present.  No CVA or flank tenderness.  Extremities:  No edema.  No calf tenderness Skin:  No rash present.   ED Course  Procedures  none   Labs Reviewed  POCT URINALYSIS DIP (MANUAL ENTRY):  Mod blood  URINE CULTURE pending      1. Dysuria   2. Acute cystitis       MDM  Urine culture pending. Begin Septra DS Continue increased fluid intake. Followup with Family Doctor if not improved in 5 days.        Lattie Haw, MD 02/26/12 1436

## 2012-02-27 ENCOUNTER — Telehealth: Payer: Self-pay

## 2012-02-27 LAB — URINE CULTURE
Colony Count: NO GROWTH
Organism ID, Bacteria: NO GROWTH

## 2012-02-27 NOTE — ED Notes (Signed)
Left a message on voice mail asking how patient is feeling and advising to call back with any questions or concerns.  

## 2012-04-07 ENCOUNTER — Encounter (HOSPITAL_BASED_OUTPATIENT_CLINIC_OR_DEPARTMENT_OTHER): Payer: Self-pay | Admitting: Specialist

## 2012-04-07 NOTE — Addendum Note (Signed)
Addendum  created 04/07/12 1635 by Fabienne Bruns, RN   Modules edited:Anesthesia Responsible Staff

## 2012-04-08 ENCOUNTER — Telehealth (HOSPITAL_COMMUNITY): Payer: Self-pay | Admitting: *Deleted

## 2012-04-08 DIAGNOSIS — C50919 Malignant neoplasm of unspecified site of unspecified female breast: Secondary | ICD-10-CM

## 2012-04-08 NOTE — Telephone Encounter (Signed)
Per Dr Gala Romney pt need an echo for breast cancer s/p chemo, order placed, echo scheduled for 12/7

## 2012-05-03 ENCOUNTER — Ambulatory Visit (HOSPITAL_COMMUNITY): Payer: 59 | Attending: Internal Medicine | Admitting: Radiology

## 2012-05-03 DIAGNOSIS — C50919 Malignant neoplasm of unspecified site of unspecified female breast: Secondary | ICD-10-CM | POA: Insufficient documentation

## 2012-05-03 DIAGNOSIS — I379 Nonrheumatic pulmonary valve disorder, unspecified: Secondary | ICD-10-CM | POA: Insufficient documentation

## 2012-05-03 DIAGNOSIS — I059 Rheumatic mitral valve disease, unspecified: Secondary | ICD-10-CM | POA: Insufficient documentation

## 2012-05-03 DIAGNOSIS — I079 Rheumatic tricuspid valve disease, unspecified: Secondary | ICD-10-CM | POA: Insufficient documentation

## 2012-05-03 DIAGNOSIS — I428 Other cardiomyopathies: Secondary | ICD-10-CM | POA: Insufficient documentation

## 2012-05-03 NOTE — Progress Notes (Signed)
Echocardiogram performed.  

## 2012-05-03 NOTE — Addendum Note (Signed)
Addended by: Aida Raider T on: 05/03/2012 02:54 PM   Modules accepted: Orders

## 2012-07-09 ENCOUNTER — Ambulatory Visit: Payer: 59 | Admitting: Internal Medicine

## 2012-07-10 ENCOUNTER — Other Ambulatory Visit: Payer: Self-pay

## 2012-07-28 ENCOUNTER — Ambulatory Visit (INDEPENDENT_AMBULATORY_CARE_PROVIDER_SITE_OTHER): Payer: 59 | Admitting: Internal Medicine

## 2012-07-28 ENCOUNTER — Encounter: Payer: Self-pay | Admitting: Internal Medicine

## 2012-07-28 VITALS — BP 144/76 | Temp 98.2°F | Wt 185.0 lb

## 2012-07-28 DIAGNOSIS — C50919 Malignant neoplasm of unspecified site of unspecified female breast: Secondary | ICD-10-CM

## 2012-07-28 DIAGNOSIS — J329 Chronic sinusitis, unspecified: Secondary | ICD-10-CM

## 2012-07-28 MED ORDER — DOXYCYCLINE HYCLATE 100 MG PO TABS: 100.0000 mg | ORAL_TABLET | Freq: Two times a day (BID) | ORAL | Status: DC

## 2012-07-28 MED ORDER — TRAMADOL HCL 50 MG PO TABS: 50.0000 mg | ORAL_TABLET | Freq: Two times a day (BID) | ORAL | Status: DC | PRN

## 2012-07-28 NOTE — Progress Notes (Signed)
Subjective:    Patient ID: [name], female    DOB: 20-Jun-1955, 57 y.o.   MRN: [id number]  HPI  57 year old white female with history of breast cancer complains of nasal congestion and sinus congestion for last 3 weeks.  She has tried over-the-counter Nasacort. However, she has persistent sinus pain and yellow / bloody nasal discharge. She denies any fever. Minimal cough. She has bilateral maxillary tenderness.  Patient also has chronic muscle skeletal plan especially after getting her chemotherapy. She has been using Celebrex 200 mg twice daily. She was recently seen by orthopedic specialist who was concerned about taking Celebrex on a chronic basis. She reports her kidney function is monitored and her last creatinine was 0.6.  Interval history her-she was seen by orthopedic specialist. She underwent knee arthroscopy. 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 Total knee replacement recommended.  Review of Systems Negative for fever. She is allergic to PCN - rash  Past Medical History  Diagnosis Date  . Elevated blood pressure reading without diagnosis of hypertension     monitor due to Avastin treatments  . 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 Acute meniscal tear of knee LEFT KNEE  . Arthritis KNEES  . Skin cancer of anterior chest BREAST CANCER PRIMARY W/ METS TO CHEST WALL SKIN CANCER--  CHEMOTHERAPY EVERY 3 WEEKS AT [facility]  . 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 Chronic anticoagulation HX  PORT-A-CATH CLOT    History   Social History  . Marital Status: Married    Spouse Name: N/A    Number of Children: 3  . Years of Education: N/A   Occupational History  . 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 Smoking status: Never Smoker   . Smokeless tobacco: Never Used  . Alcohol Use: No  . Drug Use: No  . Sexually Active: Not on file   Other  Topics Concern  . 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          Past Surgical History  Procedure Laterality Date  . Left modified radical mastectomy/ right total mastectomy  11-13-2007    LEFT BREAST CANCER W/ AXILLARY LYMPH NODE METASTASIS AND POST NEOADJUVANT CHEMO  . Placement port-a-cath  06-22-2007  . Transthoracic echocardiogram  11-18-2011  DR BENSIMHON (ECHO EVERY 3 MONTHS)    HX CHEMO INDUCED CARDIOTOXICITY/  LVSF NORMAL/ EF 55-50%/ MILD MITRIAL VALVE REGURG./ MILDLY INCREASED SYSTOLIC PRESSURE OF PULMONARY ARTERIES  . Knee arthroscopy  02/13/2012    Procedure: ARTHROSCOPY KNEE;  Surgeon: Javier Docker, MD;  Location: [facility];  Service: Orthopedics;  Laterality: Left;  WITH DEBRIDEMENt   . Knee arthroscopy with lateral menisectomy  02/13/2012    Procedure: KNEE ARTHROSCOPY WITH LATERAL MENISECTOMY;  Surgeon: Javier Docker, MD;  Location: [facility];  Service: Orthopedics;;  partial    Family History  Problem Relation Age of Onset  . Heart disease Mother     due to mitral valve regurgiation,   . Cancer Mother     breast  . Parkinsonism Father   . Stroke Sister   .  Alcohol abuse Paternal Grandfather     Allergies  Allergen Reactions  . Tape Hives    Can tolerate paper tape  . Dilaudid (Hydromorphone Hcl) Nausea And Vomiting  . Penicillins Rash    Denies airway involvement    Current Outpatient Prescriptions on File Prior to Visit  Medication Sig Dispense Refill  . Ado-Trastuzumab Emtansine (KADCYLA IV) Inject into the vein. 2.5mg /kg/  CHEMOTHERAPY DRUG GIVEN EVERY 3 WEEKS  AT [facility]      . cetirizine (ZYRTEC) 10 MG tablet Take 10 mg by mouth daily as needed.       . Cholecalciferol (VITAMIN D3) 2000 UNITS TABS Take 1 tablet by mouth 5 (five) times daily.       .  Enoxaparin Sodium (LOVENOX IJ) Inject 70 mcg as directed daily. PT INSTRUCTED TO DO 50 MCG INJECTION DAY BEFORE SURGERY AND NO INJECTION DAY OF SURGERY, WHICH IS 02-13-2012      . gabapentin (NEURONTIN) 100 MG capsule Take 100 mg by mouth as needed.       [name] ibuprofen (ADVIL,MOTRIN) 200 MG tablet Take 400 mg by mouth every 6 (six) hours as needed. Pain      . LORazepam (ATIVAN) 0.5 MG tablet Take 1 tablet (0.5 mg total) by mouth daily as needed for anxiety (currently using less than 2x per week (06/16/11)).      [name] omeprazole (PRILOSEC) 20 MG capsule Take 20 mg by mouth daily as needed.       . ondansetron (ZOFRAN) 8 MG tablet Take 1 tablet (8 mg total) by mouth every 8 (eight) hours as needed for nausea.      [name] zolpidem (AMBIEN) 5 MG tablet Take 5 mg by mouth at bedtime as needed.       No current facility-administered medications on file prior to visit.    BP 144/76  Temp(Src) 98.2 F (36.8 C) (Oral)  Wt 185 lb (83.915 kg)  BMI 28.14 kg/m2       Objective:   Physical Exam  Constitutional: She is oriented to person, place, and time. She appears well-developed and well-nourished.  HENT:  Head: Normocephalic and atraumatic.  Right Ear: External ear normal.  Left Ear: External ear normal.  Mouth/Throat: Oropharynx is clear and moist.  Cardiovascular: Normal rate and regular rhythm.   Pulmonary/Chest: Effort normal. She has no wheezes.  Neurological: She is alert and oriented to person, place, and time. No cranial nerve deficit.  Skin: Skin is warm and dry.  Psychiatric: She has a normal mood and affect. Her behavior is normal.          Assessment & Plan:

## 2012-07-28 NOTE — Assessment & Plan Note (Signed)
Patient experiencing pain after chemotherapy. Her symptoms also worse after working 10-12 are shift. I advised against long-term use of Celebrex. Trial of tramadol 50 mg twice daily as needed.

## 2012-07-28 NOTE — Assessment & Plan Note (Signed)
57 year old white female with signs and symptoms of sinusitis. She is allergic to penicillin. Treat with doxycycline 100 mg twice daily for 10 days. Patient advised to discontinue over-the-counter Nasacort and use intranasal saline as directed.

## 2012-08-24 ENCOUNTER — Encounter: Payer: Self-pay | Admitting: Internal Medicine

## 2012-08-24 ENCOUNTER — Ambulatory Visit (INDEPENDENT_AMBULATORY_CARE_PROVIDER_SITE_OTHER): Payer: 59 | Admitting: Internal Medicine

## 2012-08-24 VITALS — BP 120/70 | Temp 99.6°F | Wt 182.0 lb

## 2012-08-24 DIAGNOSIS — J329 Chronic sinusitis, unspecified: Secondary | ICD-10-CM

## 2012-08-24 DIAGNOSIS — Y33XXXS Other specified events, undetermined intent, sequela: Secondary | ICD-10-CM

## 2012-08-24 DIAGNOSIS — T50901S Poisoning by unspecified drugs, medicaments and biological substances, accidental (unintentional), sequela: Secondary | ICD-10-CM

## 2012-08-24 MED ORDER — GABAPENTIN 100 MG PO CAPS: ORAL_CAPSULE | ORAL | Status: DC

## 2012-08-24 NOTE — Progress Notes (Signed)
Subjective:    Patient ID: [name], female    DOB: 10-07-1955, 57 y.o.   MRN: [id number]  HPI  57 year old white female with history of metastatic left breast cancer previously seen for sinusitis for followup. Patient was having persistent symptoms. Her antibiotics switched to doxycycline 100 mg twice daily. Patient reports although doxycycline upset her stomach her sinus symptoms much improved. She denies any more issues with purulent nasal discharge. She has mild residual tenderness left upper neck (near her ear).  Patient also complains of intermittent pain in her hands along with cramping. This may be side effect from her chemotherapy. Gabapentin seems to be helping.  Review of Systems She is usually sensitive to pollen during spring     Past Medical History  Diagnosis Date  . Elevated blood pressure reading without diagnosis of hypertension     monitor due to Avastin treatments  . 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 Acute meniscal tear of knee LEFT KNEE  . Arthritis KNEES  . Skin cancer of anterior chest BREAST CANCER PRIMARY W/ METS TO CHEST WALL SKIN CANCER--  CHEMOTHERAPY EVERY 3 WEEKS AT [facility]  . 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 Chronic anticoagulation HX  PORT-A-CATH CLOT    History   Social History  . Marital Status: Married    Spouse Name: N/A    Number of Children: 3  . Years of Education: N/A   Occupational History  . 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 Smoking status: Never Smoker   . Smokeless tobacco: Never Used  . Alcohol Use: No  . Drug Use: No  . Sexually Active: Not on file   Other Topics Concern  . 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          Past  Surgical History  Procedure Laterality Date  . Left modified radical mastectomy/ right total mastectomy  11-13-2007    LEFT BREAST CANCER W/ AXILLARY LYMPH NODE METASTASIS AND POST NEOADJUVANT CHEMO  . Placement port-a-cath  06-22-2007  . Transthoracic echocardiogram  11-18-2011  DR BENSIMHON (ECHO EVERY 3 MONTHS)    HX CHEMO INDUCED CARDIOTOXICITY/  LVSF NORMAL/ EF 55-50%/ MILD MITRIAL VALVE REGURG./ MILDLY INCREASED SYSTOLIC PRESSURE OF PULMONARY ARTERIES  . Knee arthroscopy  02/13/2012    Procedure: ARTHROSCOPY KNEE;  Surgeon: Javier Docker, MD;  Location: [facility];  Service: Orthopedics;  Laterality: Left;  WITH DEBRIDEMENt   . Knee arthroscopy with lateral menisectomy  02/13/2012    Procedure: KNEE ARTHROSCOPY WITH LATERAL MENISECTOMY;  Surgeon: Javier Docker, MD;  Location: [facility];  Service: Orthopedics;;  partial    Family History  Problem Relation Age of Onset  . Heart disease Mother     due to mitral valve regurgiation,   . Cancer Mother     breast  . Parkinsonism Father   . Stroke Sister   . Alcohol abuse Paternal Grandfather     Allergies  Allergen Reactions  . Tape Hives    Can tolerate paper tape  . Dilaudid (Hydromorphone Hcl) Nausea And Vomiting  .  Penicillins Rash    Denies airway involvement    Current Outpatient Prescriptions on File Prior to Visit  Medication Sig Dispense Refill  . Ado-Trastuzumab Emtansine (KADCYLA IV) Inject into the vein. 2.5mg /kg/  CHEMOTHERAPY DRUG GIVEN EVERY 3 WEEKS  AT [facility]      . celecoxib (CELEBREX) 200 MG capsule Take 200 mg by mouth daily.       . cetirizine (ZYRTEC) 10 MG tablet Take 10 mg by mouth daily as needed.       . Cholecalciferol (VITAMIN D3) 2000 UNITS TABS Take 1 tablet by mouth 5 (five) times daily.       [name] darifenacin (ENABLEX) 7.5 MG 24 hr tablet Take 7.5 mg by mouth daily.      . Enoxaparin Sodium (LOVENOX IJ) Inject 70 mcg as directed daily. PT INSTRUCTED TO DO 50  MCG INJECTION DAY BEFORE SURGERY AND NO INJECTION DAY OF SURGERY, WHICH IS 02-13-2012      . ibuprofen (ADVIL,MOTRIN) 200 MG tablet Take 400 mg by mouth every 6 (six) hours as needed. Pain      . LORazepam (ATIVAN) 0.5 MG tablet Take 1 tablet (0.5 mg total) by mouth daily as needed for anxiety (currently using less than 2x per week (06/16/11)).      [name] omeprazole (PRILOSEC) 20 MG capsule Take 20 mg by mouth daily as needed.       . ondansetron (ZOFRAN) 8 MG tablet Take 1 tablet (8 mg total) by mouth every 8 (eight) hours as needed for nausea.      . traMADol (ULTRAM) 50 MG tablet Take 1 tablet (50 mg total) by mouth every 12 (twelve) hours as needed for pain.  30 tablet  1  . zolpidem (AMBIEN) 5 MG tablet Take 5 mg by mouth at bedtime as needed.       No current facility-administered medications on file prior to visit.    BP 120/70  Temp(Src) 99.6 F (37.6 C) (Oral)  Wt 182 lb (82.555 kg)  BMI 27.68 kg/m2    Objective:   Physical Exam  Constitutional: She appears well-developed and well-nourished.  Neck: Neck supple.  Mild left upper neck tenderness  Cardiovascular: Normal rate, regular rhythm and normal heart sounds.   Pulmonary/Chest: Effort normal and breath sounds normal. She has no wheezes.          Assessment & Plan:

## 2012-08-24 NOTE — Assessment & Plan Note (Signed)
Improved with doxycycline.  She has mild residual nasal congestion. This may be allergic component. Continue over-the-counter Zyrtec 10 mg once daily. Patient also advised to use intranasal saline to 4 times per day.

## 2012-08-24 NOTE — Assessment & Plan Note (Signed)
Patient experiencing bilateral hand pain and intermittent cramping. Increase gabapentin to 200 mg twice daily.

## 2012-08-24 NOTE — Patient Instructions (Addendum)
Please complete the following lab tests before your next follow up appointment: FLP - V70 Vitamin D level - 733.90

## 2012-09-02 ENCOUNTER — Encounter (HOSPITAL_BASED_OUTPATIENT_CLINIC_OR_DEPARTMENT_OTHER): Payer: Self-pay | Admitting: *Deleted

## 2012-09-02 ENCOUNTER — Emergency Department (HOSPITAL_BASED_OUTPATIENT_CLINIC_OR_DEPARTMENT_OTHER): Payer: 59

## 2012-09-02 ENCOUNTER — Emergency Department (HOSPITAL_BASED_OUTPATIENT_CLINIC_OR_DEPARTMENT_OTHER)
Admission: EM | Admit: 2012-09-02 | Discharge: 2012-09-02 | Disposition: A | Discharge status: Home / Self Care | Payer: 59 | Attending: Emergency Medicine | Admitting: Emergency Medicine

## 2012-09-02 DIAGNOSIS — M25462 Effusion, left knee: Secondary | ICD-10-CM

## 2012-09-02 DIAGNOSIS — IMO0002 Reserved for concepts with insufficient information to code with codable children: Secondary | ICD-10-CM | POA: Insufficient documentation

## 2012-09-02 DIAGNOSIS — Z87828 Personal history of other (healed) physical injury and trauma: Secondary | ICD-10-CM | POA: Insufficient documentation

## 2012-09-02 DIAGNOSIS — M25569 Pain in unspecified knee: Secondary | ICD-10-CM | POA: Insufficient documentation

## 2012-09-02 DIAGNOSIS — Z853 Personal history of malignant neoplasm of breast: Secondary | ICD-10-CM | POA: Insufficient documentation

## 2012-09-02 DIAGNOSIS — M1710 Unilateral primary osteoarthritis, unspecified knee: Secondary | ICD-10-CM | POA: Insufficient documentation

## 2012-09-02 DIAGNOSIS — M1712 Unilateral primary osteoarthritis, left knee: Secondary | ICD-10-CM

## 2012-09-02 DIAGNOSIS — M25469 Effusion, unspecified knee: Secondary | ICD-10-CM | POA: Insufficient documentation

## 2012-09-02 DIAGNOSIS — Z5111 Encounter for antineoplastic chemotherapy: Secondary | ICD-10-CM | POA: Insufficient documentation

## 2012-09-02 DIAGNOSIS — Z7901 Long term (current) use of anticoagulants: Secondary | ICD-10-CM | POA: Insufficient documentation

## 2012-09-02 DIAGNOSIS — Z79899 Other long term (current) drug therapy: Secondary | ICD-10-CM | POA: Insufficient documentation

## 2012-09-02 DIAGNOSIS — Z85841 Personal history of malignant neoplasm of brain: Secondary | ICD-10-CM | POA: Insufficient documentation

## 2012-09-02 DIAGNOSIS — Z8739 Personal history of other diseases of the musculoskeletal system and connective tissue: Secondary | ICD-10-CM | POA: Insufficient documentation

## 2012-09-02 DIAGNOSIS — Z8742 Personal history of other diseases of the female genital tract: Secondary | ICD-10-CM | POA: Insufficient documentation

## 2012-09-02 MED ORDER — HYDROCODONE-ACETAMINOPHEN 5-325 MG PO TABS
2.0000 | ORAL_TABLET | Freq: Once | ORAL | Status: AC
  Administered 2012-09-02: 2 via ORAL
  Filled 2012-09-02: qty 2

## 2012-09-02 MED ORDER — HYDROCODONE-ACETAMINOPHEN 5-325 MG PO TABS: 1.0000 | ORAL_TABLET | ORAL | Status: DC | PRN

## 2012-09-02 NOTE — ED Notes (Signed)
Pt to room 9 in w/c, reports left knee pain and swelling x Monday. Denies any injury or trauma.

## 2012-09-02 NOTE — ED Provider Notes (Signed)
History     CSN: [id number]  Arrival date & time 09/02/12  0911   First MD Initiated Contact with Patient 09/02/12 [phone]      Chief Complaint  Patient presents with  . Joint Swelling  . Knee Pain    (Consider location/radiation/quality/duration/timing/severity/associated sxs/prior treatment) HPI Pt with several days of L knee swelling and pain. No known trauma. No fever, chills. Has severe osteoarthritis in the same knee and has meniscal injury as well. Has appointment to see Dr Shelle Iron tomorrow. No warmth or redness.  Past Medical History  Diagnosis Date  . Elevated blood pressure reading without diagnosis of hypertension     monitor due to Avastin treatments  . 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 Acute meniscal tear of knee LEFT KNEE  . Arthritis KNEES  . Skin cancer of anterior chest BREAST CANCER PRIMARY W/ METS TO CHEST WALL SKIN CANCER--  CHEMOTHERAPY EVERY 3 WEEKS AT [facility]  . 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 Chronic anticoagulation HX  PORT-A-CATH CLOT    Past Surgical History  Procedure Laterality Date  . Left modified radical mastectomy/ right total mastectomy  11-13-2007    LEFT BREAST CANCER W/ AXILLARY LYMPH NODE METASTASIS AND POST NEOADJUVANT CHEMO  . Placement port-a-cath  06-22-2007  . Transthoracic echocardiogram  11-18-2011  DR BENSIMHON (ECHO EVERY 3 MONTHS)    HX CHEMO INDUCED CARDIOTOXICITY/  LVSF NORMAL/ EF 55-50%/ MILD MITRIAL VALVE REGURG./ MILDLY INCREASED SYSTOLIC PRESSURE OF PULMONARY ARTERIES  . Knee arthroscopy  02/13/2012    Procedure: ARTHROSCOPY KNEE;  Surgeon: Javier Docker, MD;  Location: [facility];  Service: Orthopedics;  Laterality: Left;  WITH DEBRIDEMENt   . Knee arthroscopy with lateral menisectomy  02/13/2012    Procedure: KNEE ARTHROSCOPY WITH LATERAL MENISECTOMY;  Surgeon: Javier Docker, MD;   Location: [facility];  Service: Orthopedics;;  partial    Family History  Problem Relation Age of Onset  . Heart disease Mother     due to mitral valve regurgiation,   . Cancer Mother     breast  . Parkinsonism Father   . Stroke Sister   . Alcohol abuse Paternal Grandfather     History  Substance Use Topics  . Smoking status: Never Smoker   . Smokeless tobacco: Never Used  . Alcohol Use: No    OB History   Grav Para Term Preterm Abortions TAB SAB Ect Mult Living                  Review of Systems  Constitutional: Negative for fever and chills.  Musculoskeletal: Positive for joint swelling and arthralgias. Negative for myalgias.  Skin: Negative for color change and rash.  Neurological: Negative for weakness and numbness.  All other systems reviewed and are negative.    Allergies  Tape; Dilaudid; and Penicillins  Home Medications   Current Outpatient Rx  Name  Route  Sig  Dispense  Refill  . Ado-Trastuzumab Emtansine (KADCYLA IV)   Intravenous   Inject into the vein. 2.5mg /kg/  CHEMOTHERAPY DRUG GIVEN EVERY 3 WEEKS  AT [facility]         . celecoxib (CELEBREX) 200 MG capsule   Oral   Take 200 mg by mouth daily.          . cetirizine (ZYRTEC) 10 MG tablet  Oral   Take 10 mg by mouth daily as needed.          . Cholecalciferol (VITAMIN D3) 2000 UNITS TABS   Oral   Take 1 tablet by mouth 5 (five) times daily.          [name] darifenacin (ENABLEX) 7.5 MG 24 hr tablet   Oral   Take 7.5 mg by mouth daily.         . Enoxaparin Sodium (LOVENOX IJ)   Injection   Inject 70 mcg as directed daily. PT INSTRUCTED TO DO 50 MCG INJECTION DAY BEFORE SURGERY AND NO INJECTION DAY OF SURGERY, WHICH IS 02-13-2012         . gabapentin (NEURONTIN) 100 MG capsule      Use 200 mg twice daily as needed   360 capsule   1   . HYDROcodone-acetaminophen (NORCO) 5-325 MG per tablet   Oral   Take 1 tablet by mouth every 4 (four) hours as needed for  pain.   10 tablet   0   . ibuprofen (ADVIL,MOTRIN) 200 MG tablet   Oral   Take 400 mg by mouth every 6 (six) hours as needed. Pain         . LORazepam (ATIVAN) 0.5 MG tablet   Oral   Take 1 tablet (0.5 mg total) by mouth daily as needed for anxiety (currently using less than 2x per week (06/16/11)).         [name] omeprazole (PRILOSEC) 20 MG capsule   Oral   Take 20 mg by mouth daily as needed.          . ondansetron (ZOFRAN) 8 MG tablet   Oral   Take 1 tablet (8 mg total) by mouth every 8 (eight) hours as needed for nausea.         . traMADol (ULTRAM) 50 MG tablet   Oral   Take 1 tablet (50 mg total) by mouth every 12 (twelve) hours as needed for pain.   30 tablet   1   . zolpidem (AMBIEN) 5 MG tablet   Oral   Take 5 mg by mouth at bedtime as needed.           BP 128/79  Pulse 79  Temp(Src) 98.2 F (36.8 C) (Oral)  Resp 18  Ht 5' 7.5" (1.715 m)  Wt 180 lb (81.647 kg)  BMI 27.76 kg/m2  SpO2 100%  Physical Exam  Nursing note and vitals reviewed. Constitutional: She is oriented to person, place, and time. She appears well-developed and well-nourished. No distress.  HENT:  Head: Normocephalic and atraumatic.  Mouth/Throat: Oropharynx is clear and moist.  Eyes: EOM are normal. Pupils are equal, round, and reactive to light.  Neck: Normal range of motion. Neck supple.  Cardiovascular: Normal rate.   Pulmonary/Chest: Effort normal.  Abdominal: Soft.  Musculoskeletal: She exhibits no edema and no tenderness.  L knee with obvious effusion and decreased ROM.  No focal pain. No warmth or redness. 2+ DP. Calfs soft nontender  Neurological: She is alert and oriented to person, place, and time.  Skin: Skin is warm and dry. No rash noted. No erythema.  Psychiatric: She has a normal mood and affect. Her behavior is normal.    ED Course  Procedures (including critical care time)  Labs Reviewed - No data to display Dg Knee Complete 4 Views Left  09/02/2012   *RADIOLOGY REPORT*  Clinical Data: Knee swelling and pain.  LEFT KNEE - COMPLETE 4+  VIEW  Comparison: 12/18/2010  Findings: There is a large suprapatellar joint effusion.  There is near complete loss of the joint space along the lateral knee compartment.  Findings are compatible severe osteoarthritic changes.  Difficult to exclude a subtle tibial plateau injury due to the severe degenerative changes and large joint effusion.  IMPRESSION: Severe degenerative changes, particularly along the lateral knee compartment.  The patient has a large joint effusion.  If there is concern for an occult fracture, recommend further evaluation with CT or MRI.   Original Report Authenticated By: Richarda Overlie, M.D.      1. Knee swelling, left   2. Osteoarthritis of left knee       MDM   Pt to f/u with orthopedist. Doubt hemarthrosis or infected joint. Effusion likely due to severe arthritis.        Loren Racer, MD 09/04/12 [phone]

## 2012-09-07 ENCOUNTER — Other Ambulatory Visit: Payer: Self-pay | Admitting: Orthopedic Surgery

## 2012-09-08 ENCOUNTER — Encounter (HOSPITAL_COMMUNITY): Payer: Self-pay | Admitting: Pharmacy Technician

## 2012-09-15 ENCOUNTER — Encounter (HOSPITAL_COMMUNITY): Payer: Self-pay

## 2012-09-15 ENCOUNTER — Ambulatory Visit (HOSPITAL_COMMUNITY)
Admission: RE | Admit: 2012-09-15 | Discharge: 2012-09-15 | Disposition: A | Discharge status: Home / Self Care | Payer: 59 | Source: Ambulatory Visit | Attending: Orthopedic Surgery | Admitting: Orthopedic Surgery

## 2012-09-15 ENCOUNTER — Encounter (HOSPITAL_COMMUNITY)
Admission: RE | Admit: 2012-09-15 | Discharge: 2012-09-15 | Disposition: A | Discharge status: Home / Self Care | Payer: 59 | Source: Ambulatory Visit | Attending: Specialist | Admitting: Specialist

## 2012-09-15 DIAGNOSIS — R9431 Abnormal electrocardiogram [ECG] [EKG]: Secondary | ICD-10-CM | POA: Insufficient documentation

## 2012-09-15 DIAGNOSIS — M1710 Unilateral primary osteoarthritis, unspecified knee: Secondary | ICD-10-CM | POA: Insufficient documentation

## 2012-09-15 DIAGNOSIS — Z01810 Encounter for preprocedural cardiovascular examination: Secondary | ICD-10-CM | POA: Insufficient documentation

## 2012-09-15 DIAGNOSIS — M25469 Effusion, unspecified knee: Secondary | ICD-10-CM | POA: Insufficient documentation

## 2012-09-15 DIAGNOSIS — Z01812 Encounter for preprocedural laboratory examination: Secondary | ICD-10-CM | POA: Insufficient documentation

## 2012-09-15 LAB — URINALYSIS, ROUTINE W REFLEX MICROSCOPIC
Glucose, UA: NEGATIVE mg/dL
Leukocytes, UA: NEGATIVE
Nitrite: NEGATIVE
Protein, ur: NEGATIVE mg/dL
pH: 6 (ref 5.0–8.0)

## 2012-09-15 LAB — CBC
HCT: 39.2 % (ref 36.0–46.0)
MCV: 91.8 fL (ref 78.0–100.0)
RBC: 4.27 MIL/uL (ref 3.87–5.11)
WBC: 6.2 10*3/uL (ref 4.0–10.5)

## 2012-09-15 LAB — PROTIME-INR: INR: 1 (ref 0.00–1.49)

## 2012-09-15 LAB — BASIC METABOLIC PANEL
CO2: 30 mEq/L (ref 19–32)
Chloride: 98 mEq/L (ref 96–112)
GFR calc Af Amer: >90 mL/min (ref >90)
Potassium: 3.9 mEq/L (ref 3.5–5.1)
Sodium: 137 mEq/L (ref 135–145)

## 2012-09-15 LAB — SURGICAL PCR SCREEN: MRSA, PCR: NEGATIVE

## 2012-09-15 NOTE — Pre-Procedure Instructions (Signed)
CXR REPORT IN EPIC FROM 10/23/11. EKG WAS DONE TODAY PREOP AT [facility].

## 2012-09-15 NOTE — Patient Instructions (Signed)
YOUR SURGERY IS SCHEDULED AT [facility]  ON:  Thursday  5/1  REPORT TO [place] [facility] AT:  5:30 AM      PHONE # FOR SHORT STAY IS [phone]  DO NOT EAT OR DRINK ANYTHING AFTER MIDNIGHT THE NIGHT BEFORE YOUR SURGERY.  YOU MAY BRUSH YOUR TEETH, RINSE OUT YOUR MOUTH--BUT NO WATER, NO FOOD, NO CHEWING GUM, NO MINTS, NO CANDIES, NO CHEWING TOBACCO.  PLEASE TAKE THE FOLLOWING MEDICATIONS THE AM OF YOUR SURGERY WITH A FEW SIPS OF WATER:   NEURONTIN, HYDROCODONE / ACETAMINOPHEN IF NEEDED FOR PAIN     DO NOT BRING VALUABLES, MONEY, CREDIT CARDS.  DO NOT WEAR JEWELRY, MAKE-UP, NAIL POLISH AND NO METAL PINS OR CLIPS IN YOUR HAIR. CONTACT LENS, DENTURES / PARTIALS, GLASSES SHOULD NOT BE WORN TO SURGERY AND IN MOST CASES-HEARING AIDS WILL NEED TO BE REMOVED.  BRING YOUR GLASSES CASE, ANY EQUIPMENT NEEDED FOR YOUR CONTACT LENS. FOR PATIENTS ADMITTED TO THE HOSPITAL--CHECK OUT TIME THE DAY OF DISCHARGE IS 11:00 AM.  ALL INPATIENT ROOMS ARE PRIVATE - WITH BATHROOM, TELEPHONE, TELEVISION AND WIFI INTERNET.                               PLEASE READ OVER ANY  FACT SHEETS THAT YOU WERE GIVEN: MRSA INFORMATION, BLOOD TRANSFUSION INFORMATION, INCENTIVE SPIROMETER INFORMATION. FAILURE TO FOLLOW THESE INSTRUCTIONS MAY RESULT IN THE CANCELLATION OF YOUR SURGERY.   PATIENT SIGNATURE_________________________________

## 2012-09-16 ENCOUNTER — Other Ambulatory Visit: Payer: Self-pay | Admitting: Orthopedic Surgery

## 2012-09-16 NOTE — H&P (Signed)
[name] DOB: 02/01/1956 Married / Language: English / Race: White Female H&P Date: 09/15/12  Chief Complaint: Left knee pain  History of Present Illness The patient is a 56 year old female who comes in today for a preoperative History and Physical. The patient is scheduled for a left total knee arthroplasty to be performed by Dr. Jeffrey C. Beane, MD at [facility] on Sep 23, 2012 . Hx several years L knee pain. Pt is s/p left knee scope on 02/13/12 with tricompartmental debridement/chondroplasty. Overall the patient feels that they are getting worse progressively. Post operative pain has been moderate (6/10). The patient does report pain and swelling, while the patient does not report drainage, fever, chills, redness, numbness, tingling or pain in the calf. The patient does indicate that these symptoms are worsening. Pain medications include: Vicodin . The patient is on the following DVT prophylaxis treatment: Lovenox (hx breast CA, portacath clot). Progressive pain, stiffness, swelling refractory to arthroscopy, bracing, ice, elevation, pain medications (Vicodin), compression (ACE), steroid injection.  Dr. Beane and the patient mutually agreed to proceed with a total knee replacement. Risks and benefits of the procedure were discussed including stiffness, suboptimal range of motion, persistent pain, infection requiring removal of prosthesis and reinsertion, need for prophylactic antibiotics in the future, for example, dental procedures, possible need for manipulation, revision in the future and also anesthetic complications including DVT, PE, etc. We discussed the perioperative course, time in the hospital, postoperative recovery and the need for elevation to control swelling. We also discussed the predicted range of motion and the probability that squatting and kneeling would be unobtainable in the future. In addition, postoperative anticoagulation was discussed. Provided her  illustrated handout and discussed it in detail. They will enroll in the total joint replacement educational forum at the hospital.  Problem List/Past Medical DJD knee Ankle impingement syndrome  Blood Clot. port-a-cath clot, on long term lovenox Breast Cancer. dx 2009, s/p chemo, mastectomy, XRT. mets to chest wall and skin. Followed by Dr. 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 none currently pyelonephritis bladder spasms  Allergies Penicillin G Sodium *PENICILLINS*. Rash. Steri-Strip *MEDICAL DEVICES*. blisters Dilaudid *ANALGESICS - OPIOID*. Vomiting. Adhesive Tape. Rash. blisters  Family History Mother. Congestive Heart Failure, Cancer. deceased at 82 Father. Parkinsonism. deceased at 85 Siblings. sister - CVA  Social History Alcohol use. former drinker Children. 3 (1 step) Current work status. working full time Drug/Alcohol Rehab (Currently). no Exercise. Exercises never Exercises weekly; does gym / weights Illicit drug use. no Living situation. live with spouse, 2 story house with 14 steps to 2nd floor Marital status. 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 no Previously in rehab. no Tobacco / smoke exposure. no Tobacco use. Never smoker. never smoker Advance Directives. none Post-Surgical Plans. home with home health  Medication History Norco (5-325MG Tablet, 1-2 Tablet(s) Oral every 4-6 hours as needed for pain, Taken starting 09/03/2012) Active. Robaxin (500MG Tablet, 1 (one) Tablet Oral po TID prn spasms, Taken starting 09/09/2012) Active. Eribulin Mesylate ( Intravenous) Specific dose unknown - Active. Xeloda ( Oral) Specific dose unknown - Active. Avastin ( Intravenous) Specific dose unknown - Active. Tykerb ( Oral) Specific dose unknown - Active. Herceptin ( Intravenous) Specific dose  unknown - Active. Vitamin D ( Oral) Specific dose unknown - Active. CeleBREX ( Oral) Specific dose unknown - Active. Lovenox (300MG/3ML Solution, 70mg Subcutaneous daily) Active. Kadcyla (   Intravenous) Specific dose unknown - Active. Neurontin (100MG Capsule, Oral) Active. Ambien (5MG Tablet, Oral) Active. Enablex (7.5MG Tablet ER 24HR, Oral daily) Active. Biotin Specific dose unknown - Active. Motrin IB ( Oral) Specific dose unknown - Active.  Pregnancy / Birth History Pregnant. no  Past Surgical History Arthroscopy of Knee. left, 02/13/12; post-op hematoma at medial portal s/p I&D in office Mastectomy. bilateral, 2009 Breast Biopsy. left  Review of Systems General:Present- Chills and Fever. Not Present- Night Sweats, Appetite Loss, Fatigue, Feeling sick, Weight Gain and Weight Loss. Skin:Not Present- Itching, Rash, Skin Color Changes, Ulcer, Psoriasis and Change in Hair or Nails. HEENT:Not Present- Sensitivity to light, Nose Bleed, Visual Loss, Decreased Hearing and Ringing in the Ears. Neck:Not Present- Swollen Glands and Neck Mass. Respiratory:Not Present- Shortness of breath, Snoring, Chronic Cough and Bloody sputum. Cardiovascular:Not Present- Shortness of Breath, Chest Pain, Swelling of Extremities, Leg Cramps and Palpitations. Gastrointestinal:Present- Vomiting and Nausea. Not Present- Bloody Stool, Heartburn, Abdominal Pain and Incontinence of Stool. Female Genitourinary:Present- Nocturia. Not Present- Blood in Urine, Irregular/missing periods, Frequency and Incontinence. Musculoskeletal:Present- Joint Stiffness, Joint Swelling and Joint Pain. Not Present- Muscle Weakness, Muscle Pain and Back Pain. Neurological:Not Present- Tingling, Numbness, Burning, Tremor, Headaches and Dizziness. Psychiatric:Not Present- Anxiety, Depression and Memory Loss. Endocrine:Not Present- Cold Intolerance, Heat Intolerance, Excessive hunger and Excessive  Thirst. Hematology:Not Present- Abnormal Bleeding, Abnormal bruising, Anemia and Blood Clots.  Vitals 09/15/2012 2:17 PM Weight: 173 lb Height: 68 in Body Surface Area: 1.94 m Body Mass Index: 26.3 kg/m Pulse: 100 (Regular) BP: 151/85 (Sitting, Left Arm, Standard)  Physical Exam The physical exam findings are as follows:  General Mental Status - Alert, cooperative and good historian. General Appearance- pleasant. Not in acute distress. Orientation- Oriented X3. Build & Nutrition- Well nourished and Well developed. Gait- Use of assistive device (cane) and Antalgic.  Head and Neck Head- normocephalic, atraumatic . Neck Global Assessment- supple. no bruit auscultated on the right and no bruit auscultated on the left.  Eye Pupil- Bilateral- Regular and Round. Motion- Bilateral- EOMI.  Chest and Lung Exam Auscultation: Breath sounds:- clear at anterior chest wall and - clear at posterior chest wall. Adventitious sounds:- No Adventitious sounds.  Cardiovascular Auscultation:Rhythm- Regular rate and rhythm. Heart Sounds- S1 WNL and S2 WNL. Murmurs & Other Heart Sounds:Auscultation of the heart reveals - No Murmurs.  Abdomen Palpation/Percussion:Tenderness- Abdomen is non-tender to palpation. Rigidity (guarding)- Abdomen is soft. Auscultation:Auscultation of the abdomen reveals - Bowel sounds normal.  Female Genitourinary Not done, not pertinent to present illness  Musculoskeletal Left knee tender to palpation medial and lateral joint lines. no tenderness to palpation of the superior calf, no tenderness to palpation of the pes anserine bursa, no tenderness to palpation of the quadriceps tendon, no tenderness to palpation of the patellar tendon, no tenderness to palpation of the patella, no tenderness to palpation of the fibular head and no tenderness to palpation of the peroneal nerve. Mild effusion. Soft tissues and compartments. Pulses  2+. Sensation intact to light touch. No erythema or ecchymosis. Well healed scope portals. PF pain with compression. Strength and Tone: Quadriceps - 5/5. Hamstrings - 5/5. ROM: 5-110, flexion contracture Valgus deformity No instability.   xrays 08/16/12 reviewed by Dr. Beane with no fx, subluxation, dislocation, lytic or blastic lesions. Severe end-stage lateral and patellofemoral joint space narrowing with associated spurs. Valgus deformity. Valgus alignment and lateral joint space narrowing have progressed since prior films in 12/2010. Evidence of prior patellar injury.  Assessment & Plan L knee DJD    Pt with end-stage severe left knee arthrosis, bone-on-bone, refractory to arthroscopy, steroid injections, activity modifications, rest, bracing, compression/ACE, opioid analgesics. She is scheduled for L TKA by Dr. Beane 09/23/12. Discussed surgery itself as well as risks, complications, and alternatives including but not limited to DVT, PE, infx, bleeding, failure of procedure, need for secondary procedure, nerve injury, scarring, anesthesia risk, even death. She understands and desires to proceed. She has been instructed on tapering down from her normal dose of Lovenox 70mg subQ daily to 30mg daily then holding pre-op appropriately per her managing provider. Remain NPO after MN night before surgery. Pre-op labs show she is staph + but not MRSA +, will add clinda to pre-op abx for coverage. Plan for home with home health post-op. Follow up 10-14 days post-op for staple removal.  Plan: Left total knee arthroplasty    Signed electronically by Shareta Fishbaugh M Alanys Godino PA-C for Dr. Beane  

## 2012-09-20 ENCOUNTER — Other Ambulatory Visit: Payer: Self-pay | Admitting: Orthopedic Surgery

## 2012-09-23 ENCOUNTER — Inpatient Hospital Stay (HOSPITAL_COMMUNITY)
Admission: RE | Admit: 2012-09-23 | Discharge: 2012-09-26 | DRG: 470 | Disposition: A | Discharge status: Home Health | Payer: 59 | Source: Ambulatory Visit | Attending: Specialist | Admitting: Specialist

## 2012-09-23 ENCOUNTER — Encounter (HOSPITAL_COMMUNITY): Payer: Self-pay | Admitting: Anesthesiology

## 2012-09-23 ENCOUNTER — Inpatient Hospital Stay (HOSPITAL_COMMUNITY): Payer: 59 | Admitting: Anesthesiology

## 2012-09-23 ENCOUNTER — Encounter (HOSPITAL_COMMUNITY): Payer: Self-pay | Admitting: *Deleted

## 2012-09-23 ENCOUNTER — Inpatient Hospital Stay (HOSPITAL_COMMUNITY): Payer: 59

## 2012-09-23 ENCOUNTER — Encounter (HOSPITAL_COMMUNITY)
Admission: RE | Disposition: A | Discharge status: Home Health | Payer: Self-pay | Source: Ambulatory Visit | Attending: Specialist

## 2012-09-23 DIAGNOSIS — Z9221 Personal history of antineoplastic chemotherapy: Secondary | ICD-10-CM

## 2012-09-23 DIAGNOSIS — Z9010 Acquired absence of unspecified breast and nipple: Secondary | ICD-10-CM

## 2012-09-23 DIAGNOSIS — IMO0002 Reserved for concepts with insufficient information to code with codable children: Secondary | ICD-10-CM | POA: Diagnosis present

## 2012-09-23 DIAGNOSIS — M1710 Unilateral primary osteoarthritis, unspecified knee: Principal | ICD-10-CM | POA: Diagnosis present

## 2012-09-23 DIAGNOSIS — M1712 Unilateral primary osteoarthritis, left knee: Secondary | ICD-10-CM | POA: Insufficient documentation

## 2012-09-23 DIAGNOSIS — Z79899 Other long term (current) drug therapy: Secondary | ICD-10-CM

## 2012-09-23 DIAGNOSIS — Z880 Allergy status to penicillin: Secondary | ICD-10-CM

## 2012-09-23 DIAGNOSIS — R030 Elevated blood-pressure reading, without diagnosis of hypertension: Secondary | ICD-10-CM | POA: Diagnosis present

## 2012-09-23 DIAGNOSIS — Z853 Personal history of malignant neoplasm of breast: Secondary | ICD-10-CM

## 2012-09-23 HISTORY — PX: TOTAL KNEE ARTHROPLASTY: SHX125

## 2012-09-23 LAB — CREATININE, SERUM
GFR calc Af Amer: >90 mL/min (ref >90)
GFR calc non Af Amer: >90 mL/min (ref >90)

## 2012-09-23 LAB — CBC
HCT: 33.8 % — ABNORMAL LOW (ref 36.0–46.0)
Hemoglobin: 11.6 g/dL — ABNORMAL LOW (ref 12.0–15.0)
MCHC: 34.3 g/dL (ref 30.0–36.0)
MCV: 91.4 fL (ref 78.0–100.0)
RDW: 15.3 % (ref 11.5–15.5)

## 2012-09-23 LAB — TYPE AND SCREEN: ABO/RH(D): A POS

## 2012-09-23 SURGERY — ARTHROPLASTY, KNEE, TOTAL
Anesthesia: General | Site: Knee | Laterality: Left | Wound class: Clean

## 2012-09-23 MED ORDER — PHENOL 1.4 % MT LIQD
1.0000 | OROMUCOSAL | Status: DC | PRN
  Filled 2012-09-23: qty 177

## 2012-09-23 MED ORDER — METHOCARBAMOL 500 MG PO TABS
500.0000 mg | ORAL_TABLET | Freq: Four times a day (QID) | ORAL | Status: DC | PRN
  Administered 2012-09-23 – 2012-09-26 (×11): 500 mg via ORAL
  Filled 2012-09-23 (×10): qty 1

## 2012-09-23 MED ORDER — MEPERIDINE HCL 50 MG/ML IJ SOLN: 6.2500 mg | INTRAMUSCULAR | Status: DC | PRN

## 2012-09-23 MED ORDER — LIDOCAINE HCL (CARDIAC) 20 MG/ML IV SOLN
INTRAVENOUS | Status: DC | PRN
  Administered 2012-09-23: 50 mg via INTRAVENOUS

## 2012-09-23 MED ORDER — CLINDAMYCIN PHOSPHATE 600 MG/50ML IV SOLN
600.0000 mg | Freq: Three times a day (TID) | INTRAVENOUS | Status: DC
  Filled 2012-09-23 (×3): qty 50

## 2012-09-23 MED ORDER — LACTATED RINGERS IV SOLN
INTRAVENOUS | Status: DC
  Administered 2012-09-23 (×2): via INTRAVENOUS

## 2012-09-23 MED ORDER — ONDANSETRON HCL 4 MG/2ML IJ SOLN
4.0000 mg | Freq: Four times a day (QID) | INTRAMUSCULAR | Status: DC | PRN
  Administered 2012-09-23 – 2012-09-24 (×2): 4 mg via INTRAVENOUS
  Filled 2012-09-23 (×2): qty 2

## 2012-09-23 MED ORDER — GABAPENTIN 100 MG PO CAPS
200.0000 mg | ORAL_CAPSULE | Freq: Two times a day (BID) | ORAL | Status: DC | PRN
  Filled 2012-09-23: qty 2

## 2012-09-23 MED ORDER — CEFAZOLIN SODIUM-DEXTROSE 2-3 GM-% IV SOLR
2.0000 g | Freq: Four times a day (QID) | INTRAVENOUS | Status: AC
  Administered 2012-09-23 (×2): 2 g via INTRAVENOUS
  Filled 2012-09-23 (×2): qty 50

## 2012-09-23 MED ORDER — SODIUM CHLORIDE 0.9 % IR SOLN
Status: DC | PRN
  Administered 2012-09-23: 08:00:00

## 2012-09-23 MED ORDER — OXYCODONE-ACETAMINOPHEN 5-325 MG PO TABS: 1.0000 | ORAL_TABLET | ORAL | Status: DC | PRN

## 2012-09-23 MED ORDER — CEFAZOLIN SODIUM-DEXTROSE 2-3 GM-% IV SOLR
2.0000 g | INTRAVENOUS | Status: AC
  Administered 2012-09-23: 2 g via INTRAVENOUS

## 2012-09-23 MED ORDER — DARIFENACIN HYDROBROMIDE ER 7.5 MG PO TB24
7.5000 mg | ORAL_TABLET | Freq: Every evening | ORAL | Status: DC
  Administered 2012-09-23 – 2012-09-25 (×3): 7.5 mg via ORAL
  Filled 2012-09-23 (×4): qty 1

## 2012-09-23 MED ORDER — SUCCINYLCHOLINE CHLORIDE 20 MG/ML IJ SOLN
INTRAMUSCULAR | Status: DC | PRN
  Administered 2012-09-23: 100 mg via INTRAVENOUS

## 2012-09-23 MED ORDER — DOCUSATE SODIUM 100 MG PO CAPS: 100.0000 mg | ORAL_CAPSULE | Freq: Two times a day (BID) | ORAL | Status: DC

## 2012-09-23 MED ORDER — PROMETHAZINE HCL 25 MG/ML IJ SOLN: 6.2500 mg | INTRAMUSCULAR | Status: DC | PRN

## 2012-09-23 MED ORDER — METOCLOPRAMIDE HCL 5 MG/ML IJ SOLN
5.0000 mg | Freq: Three times a day (TID) | INTRAMUSCULAR | Status: DC | PRN
  Filled 2012-09-23: qty 2

## 2012-09-23 MED ORDER — METHOCARBAMOL 100 MG/ML IJ SOLN: 500.0000 mg | Freq: Four times a day (QID) | INTRAVENOUS | Status: DC | PRN

## 2012-09-23 MED ORDER — CLINDAMYCIN PHOSPHATE 900 MG/50ML IV SOLN
900.0000 mg | Freq: Three times a day (TID) | INTRAVENOUS | Status: AC
  Administered 2012-09-23 (×2): 900 mg via INTRAVENOUS
  Filled 2012-09-23 (×2): qty 50

## 2012-09-23 MED ORDER — DOCUSATE SODIUM 100 MG PO CAPS
100.0000 mg | ORAL_CAPSULE | Freq: Two times a day (BID) | ORAL | Status: DC
  Administered 2012-09-23 – 2012-09-26 (×6): 100 mg via ORAL
  Filled 2012-09-23 (×3): qty 1

## 2012-09-23 MED ORDER — KETOROLAC TROMETHAMINE 30 MG/ML IJ SOLN
30.0000 mg | Freq: Once | INTRAMUSCULAR | Status: AC
  Administered 2012-09-23: 30 mg via INTRAVENOUS

## 2012-09-23 MED ORDER — BUPIVACAINE-EPINEPHRINE 0.5% -1:200000 IJ SOLN
INTRAMUSCULAR | Status: DC | PRN
  Administered 2012-09-23: 30 mL

## 2012-09-23 MED ORDER — ACETAMINOPHEN 650 MG RE SUPP: 650.0000 mg | Freq: Four times a day (QID) | RECTAL | Status: DC | PRN

## 2012-09-23 MED ORDER — GLYCOPYRROLATE 0.2 MG/ML IJ SOLN
INTRAMUSCULAR | Status: DC | PRN
  Administered 2012-09-23: .6 mg via INTRAVENOUS

## 2012-09-23 MED ORDER — PANTOPRAZOLE SODIUM 40 MG PO TBEC
40.0000 mg | DELAYED_RELEASE_TABLET | Freq: Every day | ORAL | Status: DC
  Administered 2012-09-23 – 2012-09-26 (×4): 40 mg via ORAL
  Filled 2012-09-23 (×4): qty 1

## 2012-09-23 MED ORDER — ONDANSETRON HCL 4 MG PO TABS: 8.0000 mg | ORAL_TABLET | Freq: Three times a day (TID) | ORAL | Status: DC | PRN

## 2012-09-23 MED ORDER — ROCURONIUM BROMIDE 100 MG/10ML IV SOLN
INTRAVENOUS | Status: DC | PRN
  Administered 2012-09-23: 40 mg via INTRAVENOUS

## 2012-09-23 MED ORDER — MORPHINE SULFATE 10 MG/ML IJ SOLN
1.0000 mg | INTRAMUSCULAR | Status: DC | PRN
  Administered 2012-09-23 (×7): 2 mg via INTRAVENOUS

## 2012-09-23 MED ORDER — ACETAMINOPHEN 325 MG PO TABS
650.0000 mg | ORAL_TABLET | Freq: Four times a day (QID) | ORAL | Status: DC | PRN
  Administered 2012-09-24 – 2012-09-25 (×2): 650 mg via ORAL
  Filled 2012-09-23 (×2): qty 2

## 2012-09-23 MED ORDER — FENTANYL CITRATE 0.05 MG/ML IJ SOLN
INTRAMUSCULAR | Status: DC | PRN
  Administered 2012-09-23 (×2): 50 ug via INTRAVENOUS
  Administered 2012-09-23: 100 ug via INTRAVENOUS
  Administered 2012-09-23 (×4): 50 ug via INTRAVENOUS

## 2012-09-23 MED ORDER — NEOSTIGMINE METHYLSULFATE 1 MG/ML IJ SOLN
INTRAMUSCULAR | Status: DC | PRN
  Administered 2012-09-23: 3 mg via INTRAVENOUS

## 2012-09-23 MED ORDER — ACETAMINOPHEN 10 MG/ML IV SOLN
INTRAVENOUS | Status: DC | PRN
  Administered 2012-09-23: 1000 mg via INTRAVENOUS

## 2012-09-23 MED ORDER — ONDANSETRON HCL 4 MG/2ML IJ SOLN
INTRAMUSCULAR | Status: DC | PRN
  Administered 2012-09-23: 4 mg via INTRAVENOUS

## 2012-09-23 MED ORDER — LABETALOL HCL 5 MG/ML IV SOLN
INTRAVENOUS | Status: DC | PRN
  Administered 2012-09-23: 5 mg via INTRAVENOUS

## 2012-09-23 MED ORDER — BUPIVACAINE LIPOSOME 1.3 % IJ SUSP
20.0000 mL | Freq: Once | INTRAMUSCULAR | Status: DC
  Filled 2012-09-23: qty 20

## 2012-09-23 MED ORDER — METOCLOPRAMIDE HCL 10 MG PO TABS: 5.0000 mg | ORAL_TABLET | Freq: Three times a day (TID) | ORAL | Status: DC | PRN

## 2012-09-23 MED ORDER — KETOROLAC TROMETHAMINE 30 MG/ML IJ SOLN: 30.0000 mg | Freq: Once | INTRAMUSCULAR | Status: DC

## 2012-09-23 MED ORDER — CLINDAMYCIN PHOSPHATE 900 MG/50ML IV SOLN
900.0000 mg | INTRAVENOUS | Status: AC
  Administered 2012-09-23: 900 mg via INTRAVENOUS

## 2012-09-23 MED ORDER — MORPHINE SULFATE 2 MG/ML IJ SOLN
2.0000 mg | INTRAMUSCULAR | Status: DC | PRN
  Administered 2012-09-23 – 2012-09-24 (×9): 2 mg via INTRAVENOUS
  Filled 2012-09-23 (×9): qty 1

## 2012-09-23 MED ORDER — DOCUSATE SODIUM 100 MG PO CAPS: 100.0000 mg | ORAL_CAPSULE | Freq: Every day | ORAL | Status: DC

## 2012-09-23 MED ORDER — LORATADINE 10 MG PO TABS
10.0000 mg | ORAL_TABLET | Freq: Every day | ORAL | Status: DC
  Administered 2012-09-25 – 2012-09-26 (×2): 10 mg via ORAL
  Filled 2012-09-23 (×4): qty 1

## 2012-09-23 MED ORDER — ACETAMINOPHEN 10 MG/ML IV SOLN: 1000.0000 mg | Freq: Once | INTRAVENOUS | Status: DC | PRN

## 2012-09-23 MED ORDER — ZOLPIDEM TARTRATE 5 MG PO TABS: 5.0000 mg | ORAL_TABLET | Freq: Every evening | ORAL | Status: DC | PRN

## 2012-09-23 MED ORDER — CHLORHEXIDINE GLUCONATE 4 % EX LIQD
60.0000 mL | Freq: Once | CUTANEOUS | Status: DC
  Filled 2012-09-23: qty 60

## 2012-09-23 MED ORDER — MIDAZOLAM HCL 5 MG/5ML IJ SOLN
INTRAMUSCULAR | Status: DC | PRN
  Administered 2012-09-23: 2 mg via INTRAVENOUS

## 2012-09-23 MED ORDER — OXYCODONE HCL 5 MG PO TABS
5.0000 mg | ORAL_TABLET | ORAL | Status: DC | PRN
  Administered 2012-09-23: 10 mg via ORAL
  Administered 2012-09-23: 5 mg via ORAL
  Administered 2012-09-24 – 2012-09-26 (×14): 10 mg via ORAL
  Filled 2012-09-23 (×4): qty 2
  Filled 2012-09-23: qty 1
  Filled 2012-09-23 (×11): qty 2

## 2012-09-23 MED ORDER — SODIUM CHLORIDE 0.9 % IR SOLN
Status: DC | PRN
  Administered 2012-09-23: 3000 mL

## 2012-09-23 MED ORDER — PROPOFOL 10 MG/ML IV BOLUS
INTRAVENOUS | Status: DC | PRN
  Administered 2012-09-23: 150 mg via INTRAVENOUS

## 2012-09-23 MED ORDER — ENOXAPARIN SODIUM 30 MG/0.3ML ~~LOC~~ SOLN
30.0000 mg | Freq: Two times a day (BID) | SUBCUTANEOUS | Status: DC
  Filled 2012-09-23 (×3): qty 0.3

## 2012-09-23 MED ORDER — METHOCARBAMOL 500 MG PO TABS: 500.0000 mg | ORAL_TABLET | Freq: Three times a day (TID) | ORAL | Status: DC

## 2012-09-23 MED ORDER — HEMOSTATIC AGENTS (NO CHARGE) OPTIME
TOPICAL | Status: DC | PRN
  Administered 2012-09-23: 1 via TOPICAL

## 2012-09-23 MED ORDER — SODIUM CHLORIDE 0.45 % IV SOLN
INTRAVENOUS | Status: AC
  Administered 2012-09-23: 12:00:00 via INTRAVENOUS

## 2012-09-23 MED ORDER — ONDANSETRON HCL 4 MG PO TABS: 4.0000 mg | ORAL_TABLET | Freq: Four times a day (QID) | ORAL | Status: DC | PRN

## 2012-09-23 MED ORDER — FENTANYL CITRATE 0.05 MG/ML IJ SOLN
25.0000 ug | INTRAMUSCULAR | Status: DC | PRN
  Administered 2012-09-23 (×3): 50 ug via INTRAVENOUS

## 2012-09-23 MED ORDER — MENTHOL 3 MG MT LOZG
1.0000 | LOZENGE | OROMUCOSAL | Status: DC | PRN
  Filled 2012-09-23: qty 9

## 2012-09-23 SURGICAL SUPPLY — 73 items
BAG SPEC THK2 15X12 ZIP CLS (MISCELLANEOUS) ×1
BAG ZIPLOCK 12X15 (MISCELLANEOUS) ×2 IMPLANT
BANDAGE ELASTIC 4 VELCRO ST LF (GAUZE/BANDAGES/DRESSINGS) ×2 IMPLANT
BANDAGE ELASTIC 6 VELCRO ST LF (GAUZE/BANDAGES/DRESSINGS) ×2 IMPLANT
BANDAGE ESMARK 6X9 LF (GAUZE/BANDAGES/DRESSINGS) ×1 IMPLANT
BLADE SAG 18X100X1.27 (BLADE) ×2 IMPLANT
BLADE SAW SGTL 13.0X1.19X90.0M (BLADE) ×2 IMPLANT
BNDG CMPR 9X6 STRL LF SNTH (GAUZE/BANDAGES/DRESSINGS) ×1
BNDG ESMARK 6X9 LF (GAUZE/BANDAGES/DRESSINGS) ×2
CEMENT HV SMART SET (Cement) ×3 IMPLANT
CHLORAPREP W/TINT 26ML (MISCELLANEOUS) IMPLANT
CLOTH 2% CHLOROHEXIDINE 3PK (PERSONAL CARE ITEMS) ×2 IMPLANT
CLOTH BEACON ORANGE TIMEOUT ST (SAFETY) ×2 IMPLANT
CONT SPEC 4OZ CLIKSEAL STRL BL (MISCELLANEOUS) ×1 IMPLANT
CUFF TOURN SGL QUICK 34 (TOURNIQUET CUFF) ×2
CUFF TRNQT CYL 34X4X40X1 (TOURNIQUET CUFF) ×1 IMPLANT
DECANTER SPIKE VIAL GLASS SM (MISCELLANEOUS) ×2 IMPLANT
DRAPE LG THREE QUARTER DISP (DRAPES) ×3 IMPLANT
DRAPE ORTHO SPLIT 77X108 STRL (DRAPES) ×4
DRAPE POUCH INSTRU U-SHP 10X18 (DRAPES) ×2 IMPLANT
DRAPE SURG ORHT 6 SPLT 77X108 (DRAPES) ×2 IMPLANT
DRAPE U-SHAPE 47X51 STRL (DRAPES) ×2 IMPLANT
DRSG ADAPTIC 3X8 NADH LF (GAUZE/BANDAGES/DRESSINGS) ×2 IMPLANT
DRSG AQUACEL AG ADV 3.5X10 (GAUZE/BANDAGES/DRESSINGS) ×1 IMPLANT
DRSG PAD ABDOMINAL 8X10 ST (GAUZE/BANDAGES/DRESSINGS) ×2 IMPLANT
DRSG TEGADERM 4X4.75 (GAUZE/BANDAGES/DRESSINGS) ×1 IMPLANT
DURAPREP 26ML APPLICATOR (WOUND CARE) ×2 IMPLANT
ELECT REM PT RETURN 9FT ADLT (ELECTROSURGICAL) ×2
ELECTRODE REM PT RTRN 9FT ADLT (ELECTROSURGICAL) ×1 IMPLANT
EVACUATOR 1/8 PVC DRAIN (DRAIN) ×2 IMPLANT
FACESHIELD LNG OPTICON STERILE (SAFETY) ×11 IMPLANT
GAUZE SPONGE 2X2 8PLY STRL LF (GAUZE/BANDAGES/DRESSINGS) IMPLANT
GLOVE BIOGEL PI IND STRL 7.5 (GLOVE) ×1 IMPLANT
GLOVE BIOGEL PI IND STRL 8 (GLOVE) ×1 IMPLANT
GLOVE BIOGEL PI INDICATOR 7.5 (GLOVE) ×1
GLOVE BIOGEL PI INDICATOR 8 (GLOVE) ×2
GLOVE SURG SS PI 7.5 STRL IVOR (GLOVE) ×2 IMPLANT
GLOVE SURG SS PI 8.0 STRL IVOR (GLOVE) ×5 IMPLANT
GLOVE SURG SS PI 8.5 STRL IVOR (GLOVE) ×2
GLOVE SURG SS PI 8.5 STRL STRW (GLOVE) IMPLANT
GOWN PREVENTION PLUS LG XLONG (DISPOSABLE) ×1 IMPLANT
GOWN PREVENTION PLUS XLARGE (GOWN DISPOSABLE) ×1 IMPLANT
GOWN STRL REIN XL XLG (GOWN DISPOSABLE) ×5 IMPLANT
HANDPIECE INTERPULSE COAX TIP (DISPOSABLE) ×4
IMMOBILIZER KNEE 20 (SOFTGOODS) ×2
IMMOBILIZER KNEE 20 THIGH 36 (SOFTGOODS) ×1 IMPLANT
KIT BASIN OR (CUSTOM PROCEDURE TRAY) ×2 IMPLANT
MANIFOLD NEPTUNE II (INSTRUMENTS) ×2 IMPLANT
NDL SAFETY ECLIPSE 18X1.5 (NEEDLE) IMPLANT
NEEDLE 27GAX1X1/2 (NEEDLE) ×1 IMPLANT
NEEDLE HYPO 18GX1.5 SHARP (NEEDLE) ×2
NS IRRIG 1000ML POUR BTL (IV SOLUTION) ×2 IMPLANT
PACK TOTAL JOINT (CUSTOM PROCEDURE TRAY) ×2 IMPLANT
PADDING CAST COTTON 6X4 STRL (CAST SUPPLIES) ×2 IMPLANT
POSITIONER SURGICAL ARM (MISCELLANEOUS) ×2 IMPLANT
SET HNDPC FAN SPRY TIP SCT (DISPOSABLE) ×1 IMPLANT
SPONGE GAUZE 2X2 STER 10/PKG (GAUZE/BANDAGES/DRESSINGS) ×1
SPONGE LAP 4X18 X RAY DECT (DISPOSABLE) ×1 IMPLANT
SPONGE SURGIFOAM ABS GEL 100 (HEMOSTASIS) ×2 IMPLANT
STAPLER VISISTAT (STAPLE) ×2 IMPLANT
SUCTION FRAZIER 12FR DISP (SUCTIONS) ×2 IMPLANT
SUT BONE WAX W31G (SUTURE) ×2 IMPLANT
SUT VIC AB 1 CT1 27 (SUTURE) ×2
SUT VIC AB 1 CT1 27XBRD ANTBC (SUTURE) ×4 IMPLANT
SUT VIC AB 2-0 CT1 27 (SUTURE) ×6
SUT VIC AB 2-0 CT1 TAPERPNT 27 (SUTURE) ×3 IMPLANT
SUT VLOC 180 0 24IN GS25 (SUTURE) ×2 IMPLANT
SYR 30ML LL (SYRINGE) ×2 IMPLANT
TOWEL OR 17X26 10 PK STRL BLUE (TOWEL DISPOSABLE) ×2 IMPLANT
TOWER CARTRIDGE SMART MIX (DISPOSABLE) ×2 IMPLANT
TRAY FOLEY CATH 14FRSI W/METER (CATHETERS) ×2 IMPLANT
WATER STERILE IRR 1500ML POUR (IV SOLUTION) ×3 IMPLANT
WRAP KNEE MAXI GEL POST OP (GAUZE/BANDAGES/DRESSINGS) ×2 IMPLANT

## 2012-09-23 NOTE — Anesthesia Preprocedure Evaluation (Addendum)
Anesthesia Evaluation  Patient identified by MRN, date of birth, ID band Patient awake    Reviewed: Allergy & Precautions, H&P , NPO status , Patient's Chart, lab work & pertinent test results  Airway Mallampati: II TM Distance: <3 FB Neck ROM: Full    Dental no notable dental hx. (+) Dental Advisory Given   Pulmonary neg pulmonary ROS,  breath sounds clear to auscultation  Pulmonary exam normal       Cardiovascular hypertension, Rhythm:Regular Rate:Normal     Neuro/Psych PSYCHIATRIC DISORDERS negative neurological ROS     GI/Hepatic negative GI ROS, Neg liver ROS,   Endo/Other  negative endocrine ROS  Renal/GU negative Renal ROS     Musculoskeletal negative musculoskeletal ROS (+)   Abdominal   Peds  Hematology negative hematology ROS (+)   Anesthesia Other Findings   Reproductive/Obstetrics negative OB ROS                           Anesthesia Physical  Anesthesia Plan  ASA: II  Anesthesia Plan: General   Post-op Pain Management:    Induction: Intravenous  Airway Management Planned: Oral ETT  Additional Equipment:   Intra-op Plan:   Post-operative Plan: Extubation in OR  Informed Consent: I have reviewed the patients History and Physical, chart, labs and discussed the procedure including the risks, benefits and alternatives for the proposed anesthesia with the patient or authorized representative who has indicated his/her understanding and acceptance.   Dental advisory given  Plan Discussed with: CRNA  Anesthesia Plan Comments:         Anesthesia Quick Evaluation

## 2012-09-23 NOTE — Plan of Care (Signed)
Problem: Consults Goal: Diagnosis- Total Joint Replacement Left total knee     

## 2012-09-23 NOTE — Anesthesia Postprocedure Evaluation (Signed)
Anesthesia Post Note  Patient: [name]  Procedure(s) Performed: Procedure(s) (LRB): LEFT TOTAL KNEE ARTHROPLASTY (Left)  Anesthesia type: General  Patient location: PACU  Post pain: Pain level controlled  Post assessment: Post-op Vital signs reviewed  Last Vitals: BP 142/79  Pulse 80  Temp(Src) 36.5 C (Oral)  Resp 21  SpO2 100%  Post vital signs: Reviewed  Level of consciousness: sedated  Complications: No apparent anesthesia complications

## 2012-09-23 NOTE — Interval H&P Note (Signed)
History and Physical Interval Note:  09/23/2012 7:01 AM  [name]  has presented today for surgery, with the diagnosis of Left Knee DJD  The various methods of treatment have been discussed with the patient and family. After consideration of risks, benefits and other options for treatment, the patient has consented to  Procedure(s): LEFT TOTAL KNEE ARTHROPLASTY (Left) as a surgical intervention .  The patient's history has been reviewed, patient examined, no change in status, stable for surgery.  I have reviewed the patient's chart and labs.  Questions were answered to the patient's satisfaction.     [name] C

## 2012-09-23 NOTE — H&P (View-Only) (Signed)
[name] DOB: Jan 05, 1956 Married / Language: English / Race: White Female H&P Date: 09/15/12  Chief Complaint: Left knee pain  History of Present Illness The patient is a 56 year old female who comes in today for a preoperative History and Physical. The patient is scheduled for a left total knee arthroplasty to be performed by Dr. Javier Docker, MD at [facility] on Sep 23, 2012 . Hx several years L knee pain. Pt is s/p left knee scope on 02/13/12 with tricompartmental debridement/chondroplasty. Overall the patient feels that they are getting worse progressively. Post operative pain has been moderate (6/10). The patient does report pain and swelling, while the patient does not report drainage, fever, chills, redness, numbness, tingling or pain in the calf. The patient does indicate that these symptoms are worsening. Pain medications include: Vicodin . The patient is on the following DVT prophylaxis treatment: Lovenox (hx breast CA, portacath clot). Progressive pain, stiffness, swelling refractory to arthroscopy, bracing, ice, elevation, pain medications (Vicodin), compression (ACE), steroid injection.  Dr. Shelle Iron and the patient mutually agreed to proceed with a total knee replacement. Risks and benefits of the procedure were discussed including stiffness, suboptimal range of motion, persistent pain, infection requiring removal of prosthesis and reinsertion, need for prophylactic antibiotics in the future, for example, dental procedures, possible need for manipulation, revision in the future and also anesthetic complications including DVT, PE, etc. We discussed the perioperative course, time in the hospital, postoperative recovery and the need for elevation to control swelling. We also discussed the predicted range of motion and the probability that squatting and kneeling would be unobtainable in the future. In addition, postoperative anticoagulation was discussed. Provided her  illustrated handout and discussed it in detail. They will enroll in the total joint replacement educational forum at the hospital.  Problem List/Past Medical DJD knee Ankle impingement syndrome  Blood Clot. port-a-cath clot, on long term lovenox Breast Cancer. dx 2009, s/p chemo, mastectomy, XRT. mets to chest wall and skin. Followed by Dr. 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 none currently pyelonephritis bladder spasms  Allergies Penicillin G Sodium *PENICILLINS*. Rash. Steri-Strip *MEDICAL DEVICES*. blisters Dilaudid *ANALGESICS - OPIOID*. Vomiting. Adhesive Tape. Rash. blisters  Family History Mother. Congestive Heart Failure, Cancer. deceased at 61 Father. Parkinsonism. deceased at 21 Siblings. sister - CVA  Social History Alcohol use. former drinker Children. 3 (1 step) Current work status. working full time Drug/Alcohol Rehab (Currently). no Exercise. Exercises never Exercises weekly; does gym / weights Illicit drug use. no Living situation. live with spouse, 2 story house with 14 steps to 2nd floor Marital status. 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 no Previously in rehab. no Tobacco / smoke exposure. no Tobacco use. Never smoker. never smoker [employer]chant [employer] officer. none Post-Surgical Plans. home with home health  Medication History Norco (5-325MG  Tablet, 1-2 Tablet(s) Oral every 4-6 hours as needed for pain, Taken starting 09/03/2012) Active. Robaxin (500MG  Tablet, 1 (one) Tablet Oral po TID prn spasms, Taken starting 09/09/2012) Active. Eribulin Mesylate ( Intravenous) Specific dose unknown - Active. Xeloda ( Oral) Specific dose unknown - Active. Avastin ( Intravenous) Specific dose unknown - Active. Tykerb ( Oral) Specific dose unknown - Active. Herceptin ( Intravenous) Specific dose  unknown - Active. Vitamin D ( Oral) Specific dose unknown - Active. CeleBREX ( Oral) Specific dose unknown - Active. Lovenox (300MG /3ML Solution, 70mg  Subcutaneous daily) Active. Kadcyla (  Intravenous) Specific dose unknown - Active. Neurontin (100MG  Capsule, Oral) Active. Ambien (5MG  Tablet, Oral) Active. Enablex (7.5MG  Tablet ER 24HR, Oral daily) Active. Biotin Specific dose unknown - Active. Motrin IB ( Oral) Specific dose unknown - Active.  Pregnancy / Birth History Pregnant. no  Past Surgical History Arthroscopy of Knee. left, 02/13/12; post-op hematoma at medial portal s/p I&D in office Mastectomy. bilateral, 2009 Breast Biopsy. left  Review of Systems General:Present- Chills and Fever. Not Present- Night Sweats, Appetite Loss, Fatigue, Feeling sick, Weight Gain and Weight Loss. Skin:Not Present- Itching, Rash, Skin Color Changes, Ulcer, Psoriasis and Change in Hair or Nails. HEENT:Not Present- Sensitivity to light, Nose Bleed, Visual Loss, Decreased Hearing and Ringing in the Ears. Neck:Not Present- Swollen Glands and Neck Mass. Respiratory:Not Present- Shortness of breath, Snoring, Chronic Cough and Bloody sputum. Cardiovascular:Not Present- Shortness of Breath, Chest Pain, Swelling of Extremities, Leg Cramps and Palpitations. Gastrointestinal:Present- Vomiting and Nausea. Not Present- Bloody Stool, Heartburn, Abdominal Pain and Incontinence of Stool. Female Genitourinary:Present- Nocturia. Not Present- Blood in Urine, Irregular/missing periods, Frequency and Incontinence. Musculoskeletal:Present- Joint Stiffness, Joint Swelling and Joint Pain. Not Present- Muscle Weakness, Muscle Pain and Back Pain. Neurological:Not Present- Tingling, Numbness, Burning, Tremor, Headaches and Dizziness. Psychiatric:Not Present- Anxiety, Depression and Memory Loss. Endocrine:Not Present- Cold Intolerance, Heat Intolerance, Excessive hunger and Excessive  Thirst. Hematology:Not Present- Abnormal Bleeding, Abnormal bruising, Anemia and Blood Clots.  Vitals 09/15/2012 2:17 PM Weight: 173 lb Height: 68 in Body Surface Area: 1.94 m Body Mass Index: 26.3 kg/m Pulse: 100 (Regular) BP: 151/85 (Sitting, Left Arm, Standard)  Physical Exam The physical exam findings are as follows:  General Mental Status - Alert, cooperative and good historian. General Appearance- pleasant. Not in acute distress. Orientation- Oriented X3. Build & Nutrition- Well nourished and Well developed. Gait- Use of assistive device (cane) and Antalgic.  Head and Neck Head- normocephalic, atraumatic . Neck Global Assessment- supple. no bruit auscultated on the right and no bruit auscultated on the left.  Eye Pupil- Bilateral- Regular and Round. Motion- Bilateral- EOMI.  Chest and Lung Exam Auscultation: Breath sounds:- clear at anterior chest wall and - clear at posterior chest wall. Adventitious sounds:- No Adventitious sounds.  Cardiovascular Auscultation:Rhythm- Regular rate and rhythm. Heart Sounds- S1 WNL and S2 WNL. Murmurs & Other Heart Sounds:Auscultation of the heart reveals - No Murmurs.  Abdomen Palpation/Percussion:Tenderness- Abdomen is non-tender to palpation. Rigidity (guarding)- Abdomen is soft. Auscultation:Auscultation of the abdomen reveals - Bowel sounds normal.  Female Genitourinary Not done, not pertinent to present illness  Musculoskeletal Left knee tender to palpation medial and lateral joint lines. no tenderness to palpation of the superior calf, no tenderness to palpation of the pes anserine bursa, no tenderness to palpation of the quadriceps tendon, no tenderness to palpation of the patellar tendon, no tenderness to palpation of the patella, no tenderness to palpation of the fibular head and no tenderness to palpation of the peroneal nerve. Mild effusion. Soft tissues and compartments. Pulses  2+. Sensation intact to light touch. No erythema or ecchymosis. Well healed scope portals. PF pain with compression. Strength and Tone: Quadriceps - 5/5. Hamstrings - 5/5. ROM: 5-110, flexion contracture Valgus deformity No instability.   xrays 08/16/12 reviewed by Dr. Shelle Iron with no fx, subluxation, dislocation, lytic or blastic lesions. Severe end-stage lateral and patellofemoral joint space narrowing with associated spurs. Valgus deformity. Valgus alignment and lateral joint space narrowing have progressed since prior films in 12/2010. Evidence of prior patellar injury.  Assessment & Plan L knee DJD  Pt with end-stage severe left knee arthrosis, bone-on-bone, refractory to arthroscopy, steroid injections, activity modifications, rest, bracing, compression/ACE, opioid analgesics. She is scheduled for L TKA by Dr. Shelle Iron 09/23/12. Discussed surgery itself as well as risks, complications, and alternatives including but not limited to DVT, PE, infx, bleeding, failure of procedure, need for secondary procedure, nerve injury, scarring, anesthesia risk, even death. She understands and desires to proceed. She has been instructed on tapering down from her normal dose of Lovenox 70mg  subQ daily to 30mg  daily then holding pre-op appropriately per her managing provider. Remain NPO after MN night before surgery. Pre-op labs show she is staph + but not MRSA +, will add clinda to pre-op abx for coverage. Plan for home with home health post-op. Follow up 10-14 days post-op for staple removal.  Plan: Left total knee arthroplasty    Signed electronically by Dorothy Spark PA-C for Dr. Shelle Iron

## 2012-09-23 NOTE — Progress Notes (Signed)
Physical Therapy Treatment Patient Details Name: [name] MRN: [id number] DOB: 11-Jan-1956 Today's Date: 09/23/2012 Time: 4098-1191 PT Time Calculation (min): 32 min  PT Assessment / Plan / Recommendation Comments on Treatment Session       Follow Up Recommendations  Home health PT     Does the patient have the potential to tolerate intense rehabilitation     Barriers to Discharge None      Equipment Recommendations  Rolling walker with 5" wheels    Recommendations for Other Services OT consult  Frequency 7X/week   Plan      Precautions / Restrictions Precautions Precautions: Fall;Knee Required Braces or Orthoses: Knee Immobilizer - Left Knee Immobilizer - Left: Discontinue once straight leg raise with < 10 degree lag Restrictions Weight Bearing Restrictions: No Other Position/Activity Restrictions: WBAT   Pertinent Vitals/Pain 4/10; premed, ice packs provided    Mobility  Bed Mobility Bed Mobility: Supine to Sit Supine to Sit: 4: Min assist;3: Mod assist Details for Bed Mobility Assistance: cues for sequence and use of R LE to self assist Transfers Transfers: Sit to Stand;Stand to Sit Sit to Stand: 3: Mod assist Stand to Sit: 3: Mod assist Details for Transfer Assistance: cues for LE management and use of UEs to self assist Ambulation/Gait Ambulation/Gait Assistance: 4: Min assist;3: Mod assist Ambulation Distance (Feet): 31 Feet Assistive device: Rolling walker Ambulation/Gait Assistance Details: cues for sequence, posture, stride length and position from RW Gait Pattern: Step-to pattern;Decreased step length - right;Decreased step length - left Stairs: No    Exercises Total Joint Exercises Ankle Circles/Pumps: AROM;10 reps;Supine;Both Quad Sets: AROM;10 reps;Supine;Both Heel Slides: AAROM;10 reps;Supine;Left Straight Leg Raises: AAROM;10 reps;Left;Supine   PT Diagnosis: Difficulty walking  PT Problem List: Decreased strength;Decreased range of  motion;Decreased activity tolerance;Decreased mobility;Pain;Decreased knowledge of use of DME PT Treatment Interventions: DME instruction;Gait training;Stair training;Functional mobility training;Therapeutic activities;Therapeutic exercise;Patient/family education   PT Goals Acute Rehab PT Goals PT Goal Formulation: With patient/family Time For Goal Achievement: 09/29/12 Potential to Achieve Goals: Good Pt will go Supine/Side to Sit: with supervision PT Goal: Supine/Side to Sit - Progress: Goal set today Pt will go Sit to Supine/Side: with supervision PT Goal: Sit to Supine/Side - Progress: Goal set today Pt will go Sit to Stand: with supervision PT Goal: Sit to Stand - Progress: Goal set today Pt will go Stand to Sit: with supervision PT Goal: Stand to Sit - Progress: Goal set today Pt will Ambulate: 51 - 150 feet;with supervision;with rolling walker PT Goal: Ambulate - Progress: Goal set today Pt will Go Up / Down Stairs: 3-5 stairs;with min assist;with least restrictive assistive device PT Goal: Up/Down Stairs - Progress: Goal set today  Visit Information  Last PT Received On: 09/23/12 Assistance Needed: +1    Subjective Data  Subjective: My knee was swelling up and I was limping a lot Patient Stated Goal: Resume previous lifestyle with decreased pain   Cognition  Cognition Arousal/Alertness: Awake/alert Behavior During Therapy: WFL for tasks assessed/performed Overall Cognitive Status: Within Functional Limits for tasks assessed    Balance     End of Session PT - End of Session Equipment Utilized During Treatment: Gait belt;Left knee immobilizer Activity Tolerance: Patient tolerated treatment well Patient left: in chair;with call bell/phone within reach;with family/visitor present Nurse Communication: Mobility status;Other (comment) (Pt with nausea and vomiting during session) CPM Left Knee CPM Left Knee: Off   GP     [name] 09/23/2012, 5:23 PM

## 2012-09-23 NOTE — Transfer of Care (Signed)
Immediate Anesthesia Transfer of Care Note  Patient: [name]  Procedure(s) Performed: Procedure(s) (LRB): LEFT TOTAL KNEE ARTHROPLASTY (Left)  Patient Location: PACU  Anesthesia Type: General  Level of Consciousness: sedated, patient cooperative and responds to stimulaton  Airway & Oxygen Therapy: Patient Spontanous Breathing and Patient connected to face mask oxgen  Post-op Assessment: Report given to PACU RN and Post -op Vital signs reviewed and stable  Post vital signs: Reviewed and stable  Complications: No apparent anesthesia complications

## 2012-09-23 NOTE — Brief Op Note (Signed)
09/23/2012  9:55 AM  PATIENT:  Scot Dock  57 y.o. female  PRE-OPERATIVE DIAGNOSIS:  Left Knee DJD  POST-OPERATIVE DIAGNOSIS:  Left Knee DJD  PROCEDURE:  Procedure(s): LEFT TOTAL KNEE ARTHROPLASTY (Left)  SURGEON:  Surgeon(s) and Role:    * Javier Docker, MD - Primary  PHYSICIAN ASSISTANT:   ASSISTANTS: Bissell   ANESTHESIA:   general  EBL:  Total I/O In: 1000 [I.V.:1000] Out: 300 [Urine:300]  BLOOD ADMINISTERED:none  DRAINS: none   LOCAL MEDICATIONS USED:  MARCAINE     SPECIMEN:  Source of Specimen:  knee  DISPOSITION OF SPECIMEN:  PATHOLOGY  COUNTS:  YES  TOURNIQUET:   Total Tourniquet Time Documented: Thigh (Left) - 114 minutes Total: Thigh (Left) - 114 minutes   DICTATION: .Other Dictation: Dictation Number [id number]  PLAN OF CARE: Admit to inpatient   PATIENT DISPOSITION:  PACU - hemodynamically stable.   Delay start of Pharmacological VTE agent (>24hrs) due to surgical blood loss or risk of bleeding: no

## 2012-09-24 ENCOUNTER — Encounter (HOSPITAL_COMMUNITY): Payer: Self-pay | Admitting: Specialist

## 2012-09-24 LAB — CBC
HCT: 28.9 % — ABNORMAL LOW (ref 36.0–46.0)
Hemoglobin: 9.5 g/dL — ABNORMAL LOW (ref 12.0–15.0)
MCH: 29.8 pg (ref 26.0–34.0)
MCHC: 32.9 g/dL (ref 30.0–36.0)
RBC: 3.19 MIL/uL — ABNORMAL LOW (ref 3.87–5.11)

## 2012-09-24 LAB — BASIC METABOLIC PANEL
BUN: 6 mg/dL (ref 6–23)
CO2: 30 mEq/L (ref 19–32)
GFR calc Af Amer: >90 mL/min (ref >90)
Glucose, Bld: 107 mg/dL — ABNORMAL HIGH (ref 70–99)
Potassium: 3.2 mEq/L — ABNORMAL LOW (ref 3.5–5.1)
Sodium: 129 mEq/L — ABNORMAL LOW (ref 135–145)

## 2012-09-24 MED ORDER — SODIUM CHLORIDE 0.9 % IJ SOLN
10.0000 mL | INTRAMUSCULAR | Status: DC | PRN
  Administered 2012-09-26: 10 mL

## 2012-09-24 MED ORDER — ENOXAPARIN SODIUM 30 MG/0.3ML ~~LOC~~ SOLN
30.0000 mg | SUBCUTANEOUS | Status: DC
  Administered 2012-09-24 – 2012-09-25 (×2): 30 mg via SUBCUTANEOUS
  Filled 2012-09-24 (×4): qty 0.3

## 2012-09-24 NOTE — Progress Notes (Signed)
Physical Therapy Treatment Patient Details Name: [name] MRN: [id number] DOB: [birth year] Today's Date: 09/24/2012 Time: 1000-1027 PT Time Calculation (min): 27 min  PT Assessment / Plan / Recommendation Comments on Treatment Session       Follow Up Recommendations  Home health PT     Does the patient have the potential to tolerate intense rehabilitation     Barriers to Discharge        Equipment Recommendations  Rolling walker with 5" wheels    Recommendations for Other Services OT consult  Frequency 7X/week   Plan Discharge plan remains appropriate    Precautions / Restrictions Precautions Precautions: Fall;Knee Required Braces or Orthoses: Knee Immobilizer - Left Knee Immobilizer - Left: Discontinue once straight leg raise with < 10 degree lag Restrictions Weight Bearing Restrictions: No Other Position/Activity Restrictions: WBAT   Pertinent Vitals/Pain 4/10; premed, cold packs provided    Mobility  Bed Mobility Bed Mobility: Supine to Sit Supine to Sit: 4: Min assist Details for Bed Mobility Assistance: cues for sequence and use of R LE to self assist Transfers Transfers: Sit to Stand;Stand to Sit Sit to Stand: 4: Min assist;3: Mod assist Stand to Sit: 4: Min assist;3: Mod assist Details for Transfer Assistance: cues for LE management and use of UEs to self assist Ambulation/Gait Ambulation/Gait Assistance: 4: Min assist;3: Mod assist Ambulation Distance (Feet): 37 Feet Assistive device: Rolling walker Ambulation/Gait Assistance Details: cues for stride length, posture, position from RW  and sequence Gait Pattern: Step-to pattern;Decreased step length - right;Decreased step length - left General Gait Details: ltd by onset of nausea Stairs: No    Exercises Total Joint Exercises Ankle Circles/Pumps: AROM;Supine;Both;15 reps Quad Sets: AROM;Supine;Both;15 reps Heel Slides: AAROM;Supine;Left;15 reps Straight Leg Raises: AAROM;Left;Supine;15 reps   PT  Diagnosis:    PT Problem List:   PT Treatment Interventions:     PT Goals Acute Rehab PT Goals PT Goal Formulation: With patient/family Time For Goal Achievement: 09/29/12 Potential to Achieve Goals: Good Pt will go Supine/Side to Sit: with supervision PT Goal: Supine/Side to Sit - Progress: Progressing toward goal Pt will go Sit to Supine/Side: with supervision PT Goal: Sit to Supine/Side - Progress: Progressing toward goal Pt will go Sit to Stand: with supervision PT Goal: Sit to Stand - Progress: Progressing toward goal Pt will go Stand to Sit: with supervision PT Goal: Stand to Sit - Progress: Progressing toward goal Pt will Ambulate: 51 - 150 feet;with supervision;with rolling walker PT Goal: Ambulate - Progress: Progressing toward goal Pt will Go Up / Down Stairs: 3-5 stairs;with min assist;with least restrictive assistive device  Visit Information  Last PT Received On: 09/24/12 Assistance Needed: +1    Subjective Data  Subjective: I had a lot of pain last night but doing better now Patient Stated Goal: Resume previous lifestyle with decreased pain   Cognition  Cognition Arousal/Alertness: Awake/alert Behavior During Therapy: WFL for tasks assessed/performed Overall Cognitive Status: Within Functional Limits for tasks assessed    Balance     End of Session PT - End of Session Equipment Utilized During Treatment: Gait belt;Left knee immobilizer Activity Tolerance: Patient tolerated treatment well Patient left: in chair;with call bell/phone within reach;with family/visitor present Nurse Communication: Mobility status;Other (comment)   GP     [name] 09/24/2012, 12:55 PM

## 2012-09-24 NOTE — Evaluation (Signed)
Occupational Therapy Evaluation Patient Details Name: [name] MRN: [id number] DOB: May 07, 1956 Today's Date: 09/24/2012 Time: 4098-1191 OT Time Calculation (min): 21 min  OT Assessment / Plan / Recommendation Clinical Impression   This 57 y.o. Female admitted for Lt. TKA.  She is moving well, and is doing very well with BADLs, and has very good family support.  All education completed.  No further OT needs identified, will sign off.     OT Assessment  Patient does not need any further OT services    Follow Up Recommendations  No OT follow up;Supervision - Intermittent    Barriers to Discharge      Equipment Recommendations  None recommended by OT    Recommendations for Other Services    Frequency       Precautions / Restrictions Precautions Precautions: Fall;Knee Required Braces or Orthoses: Knee Immobilizer - Left Knee Immobilizer - Left: Discontinue once straight leg raise with < 10 degree lag Restrictions Weight Bearing Restrictions: No Other Position/Activity Restrictions: WBAT       ADL  Eating/Feeding: Independent Where Assessed - Eating/Feeding: Bed level;Chair Grooming: Wash/dry hands;Min guard Where Assessed - Grooming: Supported standing Upper Body Bathing: Set up;Supervision/safety Where Assessed - Upper Body Bathing: Unsupported sitting;Supported sitting Lower Body Bathing: Min guard Where Assessed - Lower Body Bathing: Supported sit to stand Upper Body Dressing: Set up Where Assessed - Upper Body Dressing: Unsupported sitting;Supported sitting Lower Body Dressing: Min guard Where Assessed - Lower Body Dressing: Supported sit to Pharmacist, hospital: Hydrographic surveyor Method: Sit to Barista: Comfort height toilet;Grab bars Toileting - Architect and Hygiene: Min guard Where Assessed - Engineer, mining and Hygiene: Standing Equipment Used: Rolling walker;Knee Immobilizer Transfers/Ambulation  Related to ADLs: min guard assist ADL Comments: Pt able to bend forward to don/doff sock.  Pt is very motivated.  She will sponge bathe until she is able to go upstairs to her shower    OT Diagnosis:    OT Problem List:   OT Treatment Interventions:     OT Goals    Visit Information  Last OT Received On: 09/24/12 Assistance Needed: +1    Subjective Data  Subjective: I'm doing okay Patient Stated Goal: To get better   Prior Functioning     Home Living Lives With: Spouse Available Help at Discharge: Family Type of Home: House Home Access: Stairs to enter [employer] of Steps: 4 Entrance Stairs-Rails: Right Home Layout: Two level;Able to live on main level with bedroom/bathroom Alternate Level Stairs-Number of Steps: 14 Alternate Level Stairs-Rails: Right Bathroom Shower/Tub: Health visitor: Handicapped height Home Adaptive Equipment: None Additional Comments: 1/2 bath on first floor Prior Function Level of Independence: Independent Able to Take Stairs?: Yes Driving: Yes 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 Communication Communication: No difficulties Dominant Hand: Right         Vision/Perception     Cognition  Cognition Arousal/Alertness: Awake/alert Behavior During Therapy: WFL for tasks assessed/performed Overall Cognitive Status: Within Functional Limits for tasks assessed    Extremity/Trunk Assessment Right Upper Extremity Assessment RUE ROM/Strength/Tone: Within functional levels RUE Coordination: WFL - gross/fine motor Left Upper Extremity Assessment LUE ROM/Strength/Tone: Within functional levels LUE Coordination: WFL - gross/fine motor     Mobility Bed Mobility Bed Mobility: Supine to Sit;Sitting - Scoot to Edge of Bed;Sit to Supine Supine to Sit: 4: Min assist;HOB flat Sitting - Scoot to Edge of Bed: 4: Min guard Sit to  Supine: 4: Min guard;HOB flat Details for Bed Mobility Assistance:  Pt required assist to move LLE off of bed, but able to assist with lifting Lt LE onto bed Transfers Transfers: Sit to Stand;Stand to Sit Sit to Stand: 4: Min assist;With upper extremity assist;From bed;From toilet;4: Min guard Stand to Sit: 4: Min guard;4: Min assist;With upper extremity assist;To bed;To toilet Details for Transfer Assistance: Pt requires min verbal cues for hand placement     Exercise     Balance     End of Session OT - End of Session Equipment Utilized During Treatment: Left knee immobilizer Activity Tolerance: Patient tolerated treatment well Patient left: in bed;with call bell/phone within reach;with family/visitor present Nurse Communication: Patient requests pain meds  GO     Jance Siek M 09/24/2012, 3:24 PM

## 2012-09-24 NOTE — Progress Notes (Signed)
Pt with c/o nausea.  zofran 4mg  iv given per pt request.  Will hold protonix, claritin, and colace till nausea is better.

## 2012-09-24 NOTE — Progress Notes (Signed)
Spoke with Mrs Mentel on behalf of [facility] program at bedside as a benefit of being having [employer]. She is a Runner, broadcasting/film/video. She reports she is familiar with [facility] program and does not have any [facility] needs at this time. Reports she is going home with [facility] at discharge. Left [facility] brochure at bedside to call in future if needed.   Raiford Noble, MSN-Ed, RN,BSN- [facility] [phone]

## 2012-09-24 NOTE — Progress Notes (Addendum)
Patient ID: [name], female   DOB: 07/29/1955, 57 y.o.   MRN: [id number] Subjective: 1 Day Post-Op Procedure(s) (LRB): LEFT TOTAL KNEE ARTHROPLASTY (Left) Patient reports pain as severe.    Patient has complaints of left knee pain We will start therapy today. Plan is to go home with home health after hospital stay. Seen by myself and Dr. Shelle Iron in AM rounds  Objective: Vital signs in last 24 hours: Temp:  [97.7 F (36.5 C)-99.2 F (37.3 C)] 98.8 F (37.1 C) (05/02 0432) Pulse Rate:  [68-110] 110 (05/02 0432) Resp:  [13-21] 20 (05/02 0432) BP: (98-152)/(61-83) 127/75 mmHg (05/02 0432) SpO2:  [95 %-100 %] 100 % (05/02 0432) Weight:  [78.472 kg (173 lb)] 78.472 kg (173 lb) (05/01 1140)  Intake/Output from previous day:  Intake/Output Summary (Last 24 hours) at 09/24/12 0726 Last data filed at 09/24/12 0500  Gross per 24 hour  Intake 3050.83 ml  Output   2339 ml  Net 711.83 ml    Intake/Output this shift:    Labs: Results for orders placed during the hospital encounter of 09/23/12  CBC      Result Value Range   WBC 9.5  4.0 - 10.5 K/uL   RBC 3.70 (*) 3.87 - 5.11 MIL/uL   Hemoglobin 11.6 (*) 12.0 - 15.0 g/dL   HCT 78.2 (*) 95.6 - 21.3 %   MCV 91.4  78.0 - 100.0 fL   MCH 31.4  26.0 - 34.0 pg   MCHC 34.3  30.0 - 36.0 g/dL   RDW 08.6  57.8 - 46.9 %   Platelets 149 (*) 150 - 400 K/uL  CREATININE, SERUM      Result Value Range   Creatinine, Ser 0.50  0.50 - 1.10 mg/dL   GFR calc non Af Amer >90  >90 mL/min   GFR calc Af Amer >90  >90 mL/min  CBC      Result Value Range   WBC 8.2  4.0 - 10.5 K/uL   RBC 3.19 (*) 3.87 - 5.11 MIL/uL   Hemoglobin 9.5 (*) 12.0 - 15.0 g/dL   HCT 62.9 (*) 52.8 - 41.3 %   MCV 90.6  78.0 - 100.0 fL   MCH 29.8  26.0 - 34.0 pg   MCHC 32.9  30.0 - 36.0 g/dL   RDW 24.4 (*) 01.0 - 27.2 %   Platelets 129 (*) 150 - 400 K/uL  BASIC METABOLIC PANEL      Result Value Range   Sodium 129 (*) 135 - 145 mEq/L   Potassium 3.2 (*) 3.5 - 5.1 mEq/L    Chloride 93 (*) 96 - 112 mEq/L   CO2 30  19 - 32 mEq/L   Glucose, Bld 107 (*) 70 - 99 mg/dL   BUN 6  6 - 23 mg/dL   Creatinine, Ser 5.36  0.50 - 1.10 mg/dL   Calcium 8.7  8.4 - 64.4 mg/dL   GFR calc non Af Amer >90  >90 mL/min   GFR calc Af Amer >90  >90 mL/min  TYPE AND SCREEN      Result Value Range   ABO/RH(D) A POS     Antibody Screen NEG     Sample Expiration 09/26/2012    ABO/RH      Result Value Range   ABO/RH(D) A POS      Exam - Neurologically intact ABD soft Neurovascular intact Sensation intact distally Intact pulses distally Dorsiflexion/Plantar flexion intact Incision: dressing C/D/I and small amount of dried bloody  drainage on aquacel dressing No cellulitis present Compartment soft Dressing - clean Motor function intact - moving foot and toes well on exam.  Hemovac pulled without difficulty.  Assessment/Plan: 1 Day Post-Op Procedure(s) (LRB): LEFT TOTAL KNEE ARTHROPLASTY (Left)  Advance diet Up with therapy Past Medical History  Diagnosis Date  . Elevated blood pressure reading without diagnosis of hypertension     PT MONITORS HER B/P AT HOME  . 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 Arthritis KNEES  . 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 Chronic anticoagulation HX  PORT-A-CATH CLOT    2010 - HAS BEEN ON LOVENOX SINCE THE CLOT  . Skin cancer of anterior chest BREAST CANCER PRIMARY W/ METS TO CHEST WALL SKIN CANCER--  CHEMOTHERAPY EVERY 3 WEEKS AT [facility]    DVT Prophylaxis - Lovenox Protocol - pre-admission on Lovenox 70mg  QD for prior port-a-cath clot. Per recommendations of her managing physician restarted Lovenox 30mg  QD x 2 days post-op. Day 3 post-op to increase to Lovenox 70mg  QD which will be dose for D/C Weight-Bearing as tolerated to left leg Keep foley until tomorrow. No vaccines.  BISSELL, JACLYN  M. 09/24/2012, 7:26 AM

## 2012-09-24 NOTE — Progress Notes (Signed)
Physical Therapy Treatment Patient Details Name: [name] MRN: [id number] DOB: [birth year] Today's Date: 09/24/2012 Time: 2130-8657 PT Time Calculation (min): 23 min  PT Assessment / Plan / Recommendation Comments on Treatment Session       Follow Up Recommendations  Home health PT     Does the patient have the potential to tolerate intense rehabilitation     Barriers to Discharge        Equipment Recommendations  Rolling walker with 5" wheels    Recommendations for Other Services OT consult  Frequency 7X/week   Plan Discharge plan remains appropriate    Precautions / Restrictions Precautions Precautions: Fall;Knee Required Braces or Orthoses: Knee Immobilizer - Left Knee Immobilizer - Left: Discontinue once straight leg raise with < 10 degree lag Restrictions Weight Bearing Restrictions: No Other Position/Activity Restrictions: WBAT   Pertinent Vitals/Pain 3/10; premed    Mobility  Bed Mobility Bed Mobility: Supine to Sit Supine to Sit: 4: Min assist Details for Bed Mobility Assistance: cues for sequence and use of R LE to self assist Transfers Transfers: Sit to Stand;Stand to Sit Sit to Stand: 4: Min assist Stand to Sit: 4: Min assist Details for Transfer Assistance: cues for LE management and use of UEs to self assist Ambulation/Gait Ambulation/Gait Assistance: 4: Min assist Ambulation Distance (Feet): 167 Feet Assistive device: Rolling walker Ambulation/Gait Assistance Details: cues for posture, stride length and position from RW Gait Pattern: Step-to pattern;Decreased step length - right;Decreased step length - left Stairs: No    Exercises     PT Diagnosis:    PT Problem List:   PT Treatment Interventions:     PT Goals Acute Rehab PT Goals PT Goal Formulation: With patient/family Time For Goal Achievement: 09/29/12 Potential to Achieve Goals: Good Pt will go Supine/Side to Sit: with supervision PT Goal: Supine/Side to Sit - Progress:  Progressing toward goal Pt will go Sit to Supine/Side: with supervision PT Goal: Sit to Supine/Side - Progress: Progressing toward goal Pt will go Sit to Stand: with supervision PT Goal: Sit to Stand - Progress: Progressing toward goal Pt will go Stand to Sit: with supervision PT Goal: Stand to Sit - Progress: Progressing toward goal Pt will Ambulate: 51 - 150 feet;with supervision;with rolling walker PT Goal: Ambulate - Progress: Progressing toward goal Pt will Go Up / Down Stairs: 3-5 stairs;with min assist;with least restrictive assistive device  Visit Information  Last PT Received On: 09/24/12 Assistance Needed: +1    Subjective Data  Subjective: I'm doing better, no nausea Patient Stated Goal: Resume previous lifestyle with decreased pain   Cognition  Cognition Arousal/Alertness: Awake/alert Behavior During Therapy: WFL for tasks assessed/performed Overall Cognitive Status: Within Functional Limits for tasks assessed    Balance     End of Session PT - End of Session Equipment Utilized During Treatment: Gait belt;Left knee immobilizer Activity Tolerance: Patient tolerated treatment well Patient left: in bed;with call bell/phone within reach;with nursing in room;with family/visitor present Nurse Communication: Mobility status;Other (comment)   GP     Clerance Umland 09/24/2012, 3:23 PM

## 2012-09-24 NOTE — Progress Notes (Signed)
Received orders for rw and cpm.  RW has been delivered to the hospital room and cpm will be delivered to the home.

## 2012-09-24 NOTE — Care Management Note (Addendum)
    Page 1 of 2   09/26/2012     1:05:00 PM   CARE MANAGEMENT NOTE 09/26/2012  Patient:  [name], [name]   Account Number:  [id number]  Date Initiated:  09/24/2012  Documentation initiated by:  Colleen Can  Subjective/Objective Assessment:   DX LEFT KNEE DJD; TOTAL KNEE REPLACEMNT     Action/Plan:   CM SPOKE WITH PATIENT AND SPOUSE. Plans are for patient to return to her home in [place] where spouse will be caregiver. She will need RW.   Anticipated DC Date:  09/26/2012   Anticipated DC Plan:  HOME W HOME HEALTH SERVICES  In-house referral  Clinical Social Worker      DC Associate Professor  CM consult      [facility] Choice  HOME HEALTH  DURABLE MEDICAL EQUIPMENT   Choice offered to / List presented to:  C-1 Patient   DME arranged  WALKER - ROLLING  CPM  BEDSIDE COMMODE      DME agency  [facility]     HH arranged  HH-2 PT      [facility] agency  [facility]   Status of service:  Completed, signed off Medicare Important Message given?   (If response is "NO", the following Medicare IM given date fields will be blank) Date Medicare IM given:   Date Additional Medicare IM given:    Discharge Disposition:  HOME W HOME HEALTH SERVICES  Per UR Regulation:  Reviewed for med. necessity/level of care/duration of stay  If discussed at Long Length of Stay Meetings, dates discussed:    Comments:  09/26/12 Chrishelle Zito RN,BSN  NCM WEEKEND [phone] [facility] ALREADY FOLLOWING FOR HHPT,TC [facility] [phone] SPOKE TO REP FAITH ABOUT HHPT ORDER,& HOME RW,3N1,&  D/C TODAY.HOME CPM TO BE BROUGHT TO HOME ONCE D/C, SPOUSE VOICED HAS TEL # FOR [facility] & WILL CALL ONCE THEY ARE HOME.  09/24/2012 Colleen Can BSN RN CCM [phone] Orders noted for home CPM; [facility] dme rep notified.l

## 2012-09-24 NOTE — Progress Notes (Signed)
CSW consulted for SNF placement. PN reviewed. Pt plans to return home with [facility] services following hospital d/c. RNCM will assist with d/c planning needs. CSW is available to assist if d/c plan changes and SNF placement is needed.  Cori Razor LCSW [phone]

## 2012-09-24 NOTE — Op Note (Signed)
NAMEPRESTINA, [name] NO.:  [id number]  MEDICAL RECORD NO.:  [id number]  LOCATION:  1604                         FACILITY:  [facility]  PHYSICIAN:  Jene Every, M.D.    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  PROCEDURE PERFORMED:  Left total knee arthroplasty utilizing DePuy rotating platform 4 femur __________, 3 tibia, 10 mm insert, 35 patella.  HISTORY:  This is a 57 year old with metastatic breast CA, end-stage osteoporosis status post arthroscopy, progressive valgus deformity, collapse of the lateral compartment.  She was indicated for replacement of the degenerated joint.  She had bone on bone arthritis, flexion contracture, and valgus deformity.  She was refractory to rest, activity modification, corticosteroid injection.  Indicated for replacement with bone on bone arthritis, severely affected her activities of daily living, and inability to ambulate.  TECHNIQUE:  With the patient in supine position, after induction of adequate anesthesia, 2 g Kefzol and 900 clinda and due to her history of multiple hospitalizations and exposure immunocompromised history, left lower extremity was prepped and draped and exsanguinated in usual sterile fashion.  Thigh tourniquet inflated to 300 mmHg.  The knee was flexed.  Midline anterior approach of knee was performed.  Full- thickness flaps and median parapatellar arthrotomy was performed. Patella everted.  Knee was flexed.  Tricompartmental osteoarthrosis was noted.  The segment toward this was debrided and sent to pathology for evaluation.  No evidence of metastatic disease or infection.  We minimally elevated the soft tissues  medially.  Osteophytes removed with a rongeur.  Step drill was utilized and the femoral canal was irrigated, 5 degree left, placed 10 off the distal femur.  It was pinned.  An oscillating saw performed within reference off the anterior cortex as a 4.  Pinned anterior posterior chamfer cuts performed.  Following this, tibia was subluxed, __________ were was utilized.  External alignment guide parallel to the tibial shaft bisecting the tibiotalar joint.  We initially had significant subsequently __________ off the lateral side. There was really minimal defects on the plateau.  This was then pinned. An oscillating saw was utilized to perform a cut.  We then turned our attention towards completing the tibia, trialed with a 3 just medial aspect of the tibial tubercle for coverage.  Then potentially drilled, placed pin guide.  Attention was turned towards completing the femur. We used a box cutting jig and performed a box cut.  Then, we placed a trial femur compress the widened __________ to be a better fitting and then the 10-mm insert.  A slight flexion contracture but he had good stability varus and valgus stressing 0-30 degrees.  Good patellofemoral tracking.  We then noticed patella was a 24 __________ 15 utilizing the peg holes.  Patella was 35 with excellent patellofemoral tracking. Removed all the trials and checked posteriorly, removed the remnants of the menisci, cauterized the geniculate.  We released the  capsule.  No osteophytes.  There is a loose body removed as well.  Following this, I used pulsatile lavaged for removing the bone marrow.  Copiously irrigated the wound.  The knee was flexed, tibia subluxed all surfaces thoroughly dried.  Cement was mixed on the back table in appropriate fashion, injected into the tibial canal and digitally pressurized as it was not tibial surface using 3 tray impacted it into position. Redundant cement removed.  We cemented the femur, after drying  redundant cement removed.  A 10-mm insert was applied.  It was reduced.  Held through full extension with axial load throughout the curing of the cement, we cemented the patella as well.  Following this, we had full extension, full flexion, good stability, varus-valgus stressing 0-30 degrees and excellent patellofemoral tracking.  We then removed redundant cement.  We removed the trial checked posteriorly, removed redundant cement.  Copiously irrigated the wound with antibiotic irrigation.  We then placed a permanent 10-mm insert again full extension, full flexion, good stability, varus-valgus stressing 0-30 degrees with excellent patellofemoral tracking.  Placed a Hemovac and brought through a lateral stab wound in the skin.  We repaired the patellar arthrotomy with 1 Vicryl in a figure-of-eight suture with the knee in slight flexion, then used a oversewn with a running V-Loc, subcu with 2-0, skin with staples due to her allergy to tape.  After that, she had flexion to 90 degrees with gravity, full extension, full flexion, good stability, varus-valgus stressing 0-30 degrees.  Wound was dressed sterilely.  The tourniquet was deflated with adequate revascularization of lower extremity appreciated.  The patient tolerated the procedure well.  No complications.  ASSISTANT:  Lanna Poche, PA.  Tourniquet time 1:14 minutes.     Jene Every, M.D.     [name]  D:  09/23/2012  T:  09/24/2012  Job:  [id number]

## 2012-09-25 LAB — CBC
HCT: 25.8 % — ABNORMAL LOW (ref 36.0–46.0)
Hemoglobin: 8.5 g/dL — ABNORMAL LOW (ref 12.0–15.0)
MCH: 30 pg (ref 26.0–34.0)
MCV: 91.2 fL (ref 78.0–100.0)
RBC: 2.83 MIL/uL — ABNORMAL LOW (ref 3.87–5.11)
WBC: 8.4 10*3/uL (ref 4.0–10.5)

## 2012-09-25 NOTE — Progress Notes (Signed)
Physical Therapy Treatment Patient Details Name: [name] MRN: [id number] DOB: 10-05-1955 Today's Date: 09/25/2012 Time: 4098-1191 PT Time Calculation (min): 34 min  PT Assessment / Plan / Recommendation Comments on Treatment Session       Follow Up Recommendations  Home health PT     Does the patient have the potential to tolerate intense rehabilitation     Barriers to Discharge        Equipment Recommendations  Rolling walker with 5" wheels    Recommendations for Other Services OT consult  Frequency 7X/week   Plan Discharge plan remains appropriate    Precautions / Restrictions Precautions Precautions: Fall;Knee Required Braces or Orthoses: Knee Immobilizer - Left Knee Immobilizer - Left: Discontinue once straight leg raise with < 10 degree lag Restrictions Weight Bearing Restrictions: No Other Position/Activity Restrictions: WBAT   Pertinent Vitals/Pain 4/10; premedicated, cold packs provided    Mobility  Bed Mobility Bed Mobility: Supine to Sit Supine to Sit: 4: Min guard Details for Bed Mobility Assistance: Pt self assisting L LE with UEs Transfers Transfers: Sit to Stand;Stand to Sit Sit to Stand: 4: Min guard Stand to Sit: 4: Min guard Details for Transfer Assistance: cues for position at EOB and hand placement Ambulation/Gait Ambulation/Gait Assistance: 4: Min guard Ambulation Distance (Feet): 155 Feet Assistive device: Rolling walker Ambulation/Gait Assistance Details: cues for posture, stride length, step-to gait and position from RW Gait Pattern: Step-to pattern;Decreased step length - right Stairs: No    Exercises Total Joint Exercises Ankle Circles/Pumps: AROM;Supine;Both;15 reps Quad Sets: AROM;Supine;Both;20 reps Heel Slides: AAROM;Supine;Left;20 reps Straight Leg Raises: AAROM;Left;Supine;20 reps Goniometric ROM: AAROM to -12 ext and 55 flex   PT Diagnosis:    PT Problem List:   PT Treatment Interventions:     PT Goals Acute Rehab  PT Goals PT Goal Formulation: With patient/family Time For Goal Achievement: 09/29/12 Potential to Achieve Goals: Good Pt will go Supine/Side to Sit: with supervision PT Goal: Supine/Side to Sit - Progress: Progressing toward goal Pt will go Sit to Supine/Side: with supervision PT Goal: Sit to Supine/Side - Progress: Progressing toward goal Pt will go Sit to Stand: with supervision PT Goal: Sit to Stand - Progress: Progressing toward goal Pt will go Stand to Sit: with supervision PT Goal: Stand to Sit - Progress: Progressing toward goal Pt will Ambulate: 51 - 150 feet;with supervision;with rolling walker PT Goal: Ambulate - Progress: Progressing toward goal Pt will Go Up / Down Stairs: 3-5 stairs;with min assist;with least restrictive assistive device  Visit Information  Last PT Received On: 09/25/12 Assistance Needed: +1    Subjective Data  Subjective: I'm ready for you Patient Stated Goal: Resume previous lifestyle with decreased pain   Cognition  Cognition Arousal/Alertness: Awake/alert Behavior During Therapy: WFL for tasks assessed/performed Overall Cognitive Status: Within Functional Limits for tasks assessed    Balance     End of Session PT - End of Session Equipment Utilized During Treatment: Gait belt;Left knee immobilizer Activity Tolerance: Patient tolerated treatment well Patient left: in chair;with call bell/phone within reach Nurse Communication: Mobility status CPM Left Knee CPM Left Knee: Off   GP     Joeline Freer 09/25/2012, 10:04 AM

## 2012-09-25 NOTE — Progress Notes (Signed)
Physical Therapy Treatment Patient Details Name: [name] MRN: [id number] DOB: 1956/02/23 Today's Date: 09/25/2012 Time: 4098-1191 PT Time Calculation (min): 27 min  PT Assessment / Plan / Recommendation Comments on Treatment Session       Follow Up Recommendations  Home health PT     Does the patient have the potential to tolerate intense rehabilitation     Barriers to Discharge        Equipment Recommendations  Rolling walker with 5" wheels    Recommendations for Other Services OT consult  Frequency 7X/week   Plan Discharge plan remains appropriate    Precautions / Restrictions Precautions Precautions: Fall;Knee Required Braces or Orthoses: Knee Immobilizer - Left Knee Immobilizer - Left: Discontinue once straight leg raise with < 10 degree lag Restrictions Weight Bearing Restrictions: No Other Position/Activity Restrictions: WBAT   Pertinent Vitals/Pain 5/10; Pain Meds requested    Mobility  Bed Mobility Bed Mobility: Supine to Sit;Sit to Supine Supine to Sit: 4: Min guard Sitting - Scoot to Edge of Bed: 4: Min guard Sit to Supine: 4: Min guard;HOB flat Details for Bed Mobility Assistance: Pt self assisting L LE with UEs Transfers Transfers: Sit to Stand;Stand to Sit Sit to Stand: 4: Min guard;From bed;From chair/3-in-1 Stand to Sit: 4: Min guard;To bed;To chair/3-in-1 Details for Transfer Assistance: cues for position at EOB and hand placement Ambulation/Gait Ambulation/Gait Assistance: 4: Min guard;5: Supervision Ambulation Distance (Feet): 150 Feet (and 25) Assistive device: Rolling walker Ambulation/Gait Assistance Details: cues for posture, increased step length on R and position from RW Gait Pattern: Step-to pattern;Decreased step length - right Stairs: Yes Stairs Assistance: 4: Min assist Stairs Assistance Details (indicate cue type and reason): cues for sequence and cane placement Stair Management Technique: One rail Right;Step to  pattern;Forwards;With cane Number of Stairs: 4    Exercises     PT Diagnosis:    PT Problem List:   PT Treatment Interventions:     PT Goals Acute Rehab PT Goals PT Goal Formulation: With patient/family Time For Goal Achievement: 09/29/12 Potential to Achieve Goals: Good Pt will go Supine/Side to Sit: with supervision PT Goal: Supine/Side to Sit - Progress: Progressing toward goal Pt will go Sit to Supine/Side: with supervision PT Goal: Sit to Supine/Side - Progress: Progressing toward goal Pt will go Sit to Stand: with supervision PT Goal: Sit to Stand - Progress: Progressing toward goal Pt will go Stand to Sit: with supervision PT Goal: Stand to Sit - Progress: Progressing toward goal Pt will Ambulate: 51 - 150 feet;with supervision;with rolling walker PT Goal: Ambulate - Progress: Progressing toward goal Pt will Go Up / Down Stairs: 3-5 stairs;with min assist;with least restrictive assistive device PT Goal: Up/Down Stairs - Progress: Progressing toward goal  Visit Information  Last PT Received On: 09/25/12 Assistance Needed: +1    Subjective Data  Subjective: I need to use the bathroom. Patient Stated Goal: Resume previous lifestyle with decreased pain   Cognition  Cognition Arousal/Alertness: Awake/alert Behavior During Therapy: WFL for tasks assessed/performed Overall Cognitive Status: Within Functional Limits for tasks assessed    Balance     End of Session PT - End of Session Equipment Utilized During Treatment: Gait belt;Left knee immobilizer Activity Tolerance: Patient tolerated treatment well Patient left: in bed;with call bell/phone within reach Nurse Communication: Mobility status CPM Left Knee CPM Left Knee: On   GP     Tyleigh Mahn 09/25/2012, 4:57 PM

## 2012-09-25 NOTE — Progress Notes (Signed)
Subjective: 2 Days Post-Op Procedure(s) (LRB): LEFT TOTAL KNEE ARTHROPLASTY (Left) Patient reports pain as 3 on 0-10 scale.    Objective: Vital signs in last 24 hours: Temp:  [97.3 F (36.3 C)-100.3 F (37.9 C)] 100 F (37.8 C) (05/03 0601) Pulse Rate:  [63-114] 63 (05/03 0601) Resp:  [15-18] 16 (05/03 0601) BP: (107-120)/(67-77) 107/67 mmHg (05/03 0601) SpO2:  [97 %-100 %] 100 % (05/03 0601)  Intake/Output from previous day: 05/02 0701 - 05/03 0700 In: 1720 [P.O.:1560; I.V.:160] Out: 2825 [Urine:2825] Intake/Output this shift: Total I/O In: -  Out: 250 [Urine:250]   Recent Labs  09/23/12 1311 09/24/12 0625 09/25/12 0410  HGB 11.6* 9.5* 8.5*    Recent Labs  09/24/12 0625 09/25/12 0410  WBC 8.2 8.4  RBC 3.19* 2.83*  HCT 28.9* 25.8*  PLT 129* 125*    Recent Labs  09/23/12 1311 09/24/12 0625  NA  --  129*  K  --  3.2*  CL  --  93*  CO2  --  30  BUN  --  6  CREATININE 0.50 0.57  GLUCOSE  --  107*  CALCIUM  --  8.7   No results found for this basename: LABPT, INR,  in the last 72 hours  Neurologically intact Neurovascular intact Sensation intact distally Incision: no drainage and Changed. wound dry  Assessment/Plan: 2 Days Post-Op Procedure(s) (LRB): LEFT TOTAL KNEE ARTHROPLASTY (Left) Advance diet Up with therapy D/C IV fluids check Hgb in am and NA.  Donnivan Villena C 09/25/2012, 8:11 AM

## 2012-09-26 LAB — CBC
HCT: 22.8 % — ABNORMAL LOW (ref 36.0–46.0)
Hemoglobin: 7.5 g/dL — ABNORMAL LOW (ref 12.0–15.0)
MCH: 30 pg (ref 26.0–34.0)
MCHC: 32.9 g/dL (ref 30.0–36.0)
MCV: 91.2 fL (ref 78.0–100.0)
RDW: 15.8 % — ABNORMAL HIGH (ref 11.5–15.5)

## 2012-09-26 MED ORDER — HEPARIN SOD (PORK) LOCK FLUSH 100 UNIT/ML IV SOLN
500.0000 [IU] | INTRAVENOUS | Status: AC | PRN
  Administered 2012-09-26: 500 [IU]

## 2012-09-26 MED ORDER — ENOXAPARIN SODIUM 80 MG/0.8ML ~~LOC~~ SOLN
70.0000 mg | SUBCUTANEOUS | Status: DC
  Administered 2012-09-26: 70 mg via SUBCUTANEOUS
  Filled 2012-09-26: qty 0.8

## 2012-09-26 MED ORDER — DSS 100 MG PO CAPS: 100.0000 mg | ORAL_CAPSULE | Freq: Two times a day (BID) | ORAL | Status: DC

## 2012-09-26 MED ORDER — ENOXAPARIN SODIUM 80 MG/0.8ML ~~LOC~~ SOLN: 70.0000 mg | SUBCUTANEOUS | Status: DC

## 2012-09-26 NOTE — Progress Notes (Signed)
   Subjective: 3 Days Post-Op Procedure(s) (LRB): LEFT TOTAL KNEE ARTHROPLASTY (Left)   Patient reports pain as mild, pain well controlled. Aware of low H&H, but states that she has no symptoms. She states that she is ready to go home.  Objective:   VITALS:   Filed Vitals:   09/26/12  0800  BP:   Pulse:   Temp:    Resp: 18    Neurovascular intact Dorsiflexion/Plantar flexion intact Incision: scant drainage No cellulitis present Compartment soft  LABS  Recent Labs  09/24/12 0625 09/25/12 0410 09/26/12 0450  HGB 9.5* 8.5* 7.5*  HCT 28.9* 25.8* 22.8*  WBC 8.2 8.4 6.7  PLT 129* 125* 125*     Recent Labs  09/23/12 1311 09/24/12 0625  NA  --  129*  K  --  3.2*  BUN  --  6  CREATININE 0.50 0.57  GLUCOSE  --  107*     Assessment/Plan: 3 Days Post-Op Procedure(s) (LRB): LEFT TOTAL KNEE ARTHROPLASTY (Left) Up with therapy Discharge home with home health Follow up in 2 weeks at [facility]. Follow up with Dr Shelle Iron in 2 weeks.  Contact information:  [facility] [address] [place] [address] [phone]        Anastasio Auerbach. Ashad Fawbush   PAC  09/26/2012, 8:53 AM

## 2012-09-26 NOTE — Progress Notes (Signed)
Pt stable, scripts, d/c instructions, and equipment given with no questions/concerns voiced by pt or husband.  Pt transported via wheelchair to private vehicle with NT and husband. 

## 2012-09-26 NOTE — Progress Notes (Signed)
Physical Therapy Treatment Patient Details Name: [name] MRN: [id number] DOB: Jun 19, 1955 Today's Date: 09/26/2012 Time: 0907-0950 PT Time Calculation (min): 43 min  PT Assessment / Plan / Recommendation Comments on Treatment Session  Pt's spouse present and assisting with stairs, transfers and application of KI    Follow Up Recommendations  Home health PT     Does the patient have the potential to tolerate intense rehabilitation     Barriers to Discharge        Equipment Recommendations  Rolling walker with 5" wheels    Recommendations for Other Services OT consult  Frequency 7X/week   Plan Discharge plan remains appropriate    Precautions / Restrictions Precautions Precautions: Fall;Knee Required Braces or Orthoses: Knee Immobilizer - Left Knee Immobilizer - Left: Discontinue once straight leg raise with < 10 degree lag Restrictions Weight Bearing Restrictions: No Other Position/Activity Restrictions: WBAT   Pertinent Vitals/Pain     Mobility  Bed Mobility Bed Mobility: Supine to Sit;Sit to Supine Supine to Sit: 5: Supervision Sitting - Scoot to Edge of Bed: 5: Supervision Sit to Supine: 5: Supervision Details for Bed Mobility Assistance: Pt self assisting L LE with UEs Transfers Transfers: Sit to Stand;Stand to Sit Sit to Stand: 5: Supervision;From bed;With upper extremity assist Stand to Sit: 5: Supervision;To elevated surface;To bed;To chair/3-in-1 Details for Transfer Assistance: min cues for LE management Ambulation/Gait Ambulation/Gait Assistance: 4: Min guard;5: Supervision Ambulation Distance (Feet): 155 Feet Assistive device: Rolling walker Ambulation/Gait Assistance Details: cues for posture and position from RW Gait Pattern: Step-to pattern;Decreased stance time - right Stairs: Yes Stairs Assistance: 4: Min assist Stairs Assistance Details (indicate cue type and reason): cues for sequence and cane placement Stair Management Technique: One rail  Right;Step to pattern;Forwards;With cane Number of Stairs: 4 (husband assisting)    Exercises Total Joint Exercises Ankle Circles/Pumps: AROM;Supine;Both;15 reps Quad Sets: AROM;Supine;Both;20 reps Heel Slides: AAROM;Supine;Left;20 reps Straight Leg Raises: AAROM;Left;Supine;20 reps Long Arc Quad: AAROM;Both;10 reps;Supine Goniometric ROM: AAROM to -8 ext and 55 flex   PT Diagnosis:    PT Problem List:   PT Treatment Interventions:     PT Goals Acute Rehab PT Goals PT Goal Formulation: With patient/family Time For Goal Achievement: 09/29/12 Potential to Achieve Goals: Good Pt will go Supine/Side to Sit: with supervision PT Goal: Supine/Side to Sit - Progress: Met Pt will go Sit to Supine/Side: with supervision PT Goal: Sit to Supine/Side - Progress: Met Pt will go Sit to Stand: with supervision PT Goal: Sit to Stand - Progress: Met Pt will go Stand to Sit: with supervision PT Goal: Stand to Sit - Progress: Met Pt will Ambulate: 51 - 150 feet;with supervision;with rolling walker PT Goal: Ambulate - Progress: Met Pt will Go Up / Down Stairs: 3-5 stairs;with min assist;with least restrictive assistive device PT Goal: Up/Down Stairs - Progress: Met  Visit Information  Last PT Received On: 09/26/12 Assistance Needed: +1    Subjective Data  Subjective: I'm going home today Patient Stated Goal: Resume previous lifestyle with decreased pain   Cognition  Cognition Arousal/Alertness: Awake/alert Behavior During Therapy: WFL for tasks assessed/performed Overall Cognitive Status: Within Functional Limits for tasks assessed    Balance     End of Session PT - End of Session Equipment Utilized During Treatment: Gait belt;Left knee immobilizer Activity Tolerance: Patient tolerated treatment well Patient left: in chair;with call bell/phone within reach;with family/visitor present Nurse Communication: Mobility status CPM Left Knee CPM Left Knee: Off   GP  Adilson Grafton 09/26/2012, 11:45 AM

## 2012-09-27 NOTE — Discharge Summary (Signed)
Physician Discharge Summary   Patient ID: MARGRIT MINNER MRN: [id number] DOB/AGE: 1955-08-07 57 y.o.  Admit date: 09/23/2012 Discharge date: 09/26/2012  Primary Diagnosis: left knee DJD  Admission Diagnoses:  Past Medical History  Diagnosis Date  . Elevated blood pressure reading without diagnosis of hypertension     PT MONITORS HER B/P AT HOME  . 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 Arthritis KNEES  . 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 Chronic anticoagulation HX  PORT-A-CATH CLOT    2010 - HAS BEEN ON LOVENOX SINCE THE CLOT  . Skin cancer of anterior chest BREAST CANCER PRIMARY W/ METS TO CHEST WALL SKIN CANCER--  CHEMOTHERAPY EVERY 3 WEEKS AT [facility]   Discharge Diagnoses:   Principal Problem:   OSTEOARTHRITIS, KNEE, LEFT  Estimated body mass index is 26.31 kg/(m^2) as calculated from the following:   Height as of this encounter: 5\' 8"  (1.727 m).   Weight as of this encounter: 78.472 kg (173 lb).  Procedure:  Procedure(s) (LRB): LEFT TOTAL KNEE ARTHROPLASTY (Left)   Consults: None  HPI: see H&P Laboratory Data: Admission on 09/23/2012, Discharged on 09/26/2012  Component Date Value Range Status  . ABO/RH(D) 09/23/2012 A POS   Final  . Antibody Screen 09/23/2012 NEG   Final  . Sample Expiration 09/23/2012 09/26/2012   Final  . ABO/RH(D) 09/23/2012 A POS   Final  . WBC 09/23/2012 9.5  4.0 - 10.5 K/uL Final  . RBC 09/23/2012 3.70* 3.87 - 5.11 MIL/uL Final  . Hemoglobin 09/23/2012 11.6* 12.0 - 15.0 g/dL Final  . HCT 62/13/0865 33.8* 36.0 - 46.0 % Final  . MCV 09/23/2012 91.4  78.0 - 100.0 fL Final  . MCH 09/23/2012 31.4  26.0 - 34.0 pg Final  . MCHC 09/23/2012 34.3  30.0 - 36.0 g/dL Final  . RDW 78/46/9629 15.3  11.5 - 15.5 % Final  . Platelets 09/23/2012 149* 150 - 400 K/uL Final  . Creatinine, Ser 09/23/2012 0.50  0.50 - 1.10  mg/dL Final  . GFR calc non Af Amer 09/23/2012 >90  >90 mL/min Final  . GFR calc Af Amer 09/23/2012 >90  >90 mL/min Final   Comment:                                 The eGFR has been calculated                          using the CKD EPI equation.                          This calculation has not been                          validated in all clinical                          situations.                          eGFR's persistently                          <  90 mL/min signify                          possible Chronic Kidney Disease.  . WBC 09/24/2012 8.2  4.0 - 10.5 K/uL Final  . RBC 09/24/2012 3.19* 3.87 - 5.11 MIL/uL Final  . Hemoglobin 09/24/2012 9.5* 12.0 - 15.0 g/dL Final   Comment: REPEATED TO VERIFY                          DELTA CHECK NOTED  . HCT 09/24/2012 28.9* 36.0 - 46.0 % Final  . MCV 09/24/2012 90.6  78.0 - 100.0 fL Final  . MCH 09/24/2012 29.8  26.0 - 34.0 pg Final  . MCHC 09/24/2012 32.9  30.0 - 36.0 g/dL Final  . RDW 16/02/9603 15.7* 11.5 - 15.5 % Final  . Platelets 09/24/2012 129* 150 - 400 K/uL Final  . Sodium 09/24/2012 129* 135 - 145 mEq/L Final  . Potassium 09/24/2012 3.2* 3.5 - 5.1 mEq/L Final  . Chloride 09/24/2012 93* 96 - 112 mEq/L Final  . CO2 09/24/2012 30  19 - 32 mEq/L Final  . Glucose, Bld 09/24/2012 107* 70 - 99 mg/dL Final  . BUN 54/01/8118 6  6 - 23 mg/dL Final  . Creatinine, Ser 09/24/2012 0.57  0.50 - 1.10 mg/dL Final  . Calcium 14/78/2956 8.7  8.4 - 10.5 mg/dL Final  . GFR calc non Af Amer 09/24/2012 >90  >90 mL/min Final  . GFR calc Af Amer 09/24/2012 >90  >90 mL/min Final   Comment:                                 The eGFR has been calculated                          using the CKD EPI equation.                          This calculation has not been                          validated in all clinical                          situations.                          eGFR's persistently                          <90 mL/min signify                           possible Chronic Kidney Disease.  . WBC 09/25/2012 8.4  4.0 - 10.5 K/uL Final  . RBC 09/25/2012 2.83* 3.87 - 5.11 MIL/uL Final  . Hemoglobin 09/25/2012 8.5* 12.0 - 15.0 g/dL Final  . HCT 21/30/8657 25.8* 36.0 - 46.0 % Final  . MCV 09/25/2012 91.2  78.0 - 100.0 fL Final  . MCH 09/25/2012 30.0  26.0 - 34.0 pg Final  . MCHC 09/25/2012 32.9  30.0 - 36.0 g/dL Final  . RDW 84/69/6295 15.8* 11.5 - 15.5 % Final  . Platelets 09/25/2012 125*  150 - 400 K/uL Final  . WBC 09/26/2012 6.7  4.0 - 10.5 K/uL Final  . RBC 09/26/2012 2.50* 3.87 - 5.11 MIL/uL Final  . Hemoglobin 09/26/2012 7.5* 12.0 - 15.0 g/dL Final  . HCT 44/05/270 22.8* 36.0 - 46.0 % Final  . MCV 09/26/2012 91.2  78.0 - 100.0 fL Final  . MCH 09/26/2012 30.0  26.0 - 34.0 pg Final  . MCHC 09/26/2012 32.9  30.0 - 36.0 g/dL Final  . RDW 53/66/4403 15.8* 11.5 - 15.5 % Final  . Platelets 09/26/2012 125* 150 - 400 K/uL Final  Hospital Outpatient Visit on 09/15/2012  Component Date Value Range Status  . MRSA, PCR 09/15/2012 NEGATIVE  NEGATIVE Final  . Staphylococcus aureus 09/15/2012 POSITIVE* NEGATIVE Final   Comment:                                 The Xpert SA Assay (FDA                          approved for NASAL specimens                          in patients over 62 years of age),                          is one component of                          a comprehensive surveillance                          program.  Test performance has                          been validated by [employer] for patients greater                          than or equal to 46 year old.                          It is not intended                          to diagnose infection nor to                          guide or monitor treatment.  . Sodium 09/15/2012 137  135 - 145 mEq/L Final  . Potassium 09/15/2012 3.9  3.5 - 5.1 mEq/L Final  . Chloride 09/15/2012 98  96 - 112 mEq/L Final  . CO2 09/15/2012 30  19 - 32 mEq/L Final  .  Glucose, Bld 09/15/2012 88  70 - 99 mg/dL Final  . BUN 47/42/5956 13  6 - 23 mg/dL Final  . Creatinine, Ser 09/15/2012 0.55  0.50 - 1.10 mg/dL Final  . Calcium 38/75/6433 10.0  8.4 - 10.5 mg/dL Final  . GFR calc non Af Amer 09/15/2012 >90  >90 mL/min Final  .  GFR calc Af Amer 09/15/2012 >90  >90 mL/min Final   Comment:                                 The eGFR has been calculated                          using the CKD EPI equation.                          This calculation has not been                          validated in all clinical                          situations.                          eGFR's persistently                          <90 mL/min signify                          possible Chronic Kidney Disease.  . WBC 09/15/2012 6.2  4.0 - 10.5 K/uL Final  . RBC 09/15/2012 4.27  3.87 - 5.11 MIL/uL Final  . Hemoglobin 09/15/2012 13.0  12.0 - 15.0 g/dL Final  . HCT 16/02/9603 39.2  36.0 - 46.0 % Final  . MCV 09/15/2012 91.8  78.0 - 100.0 fL Final  . MCH 09/15/2012 30.4  26.0 - 34.0 pg Final  . MCHC 09/15/2012 33.2  30.0 - 36.0 g/dL Final  . RDW 54/01/8118 15.6* 11.5 - 15.5 % Final  . Platelets 09/15/2012 237  150 - 400 K/uL Final  . Prothrombin Time 09/15/2012 13.1  11.6 - 15.2 seconds Final  . INR 09/15/2012 1.00  0.00 - 1.49 Final  . Color, Urine 09/15/2012 YELLOW  YELLOW Final  . APPearance 09/15/2012 CLEAR  CLEAR Final  . Specific Gravity, Urine 09/15/2012 1.011  1.005 - 1.030 Final  . pH 09/15/2012 6.0  5.0 - 8.0 Final  . Glucose, UA 09/15/2012 NEGATIVE  NEGATIVE mg/dL Final  . Hgb urine dipstick 09/15/2012 NEGATIVE  NEGATIVE Final  . Bilirubin Urine 09/15/2012 NEGATIVE  NEGATIVE Final  . Ketones, ur 09/15/2012 NEGATIVE  NEGATIVE mg/dL Final  . Protein, ur 14/78/2956 NEGATIVE  NEGATIVE mg/dL Final  . Urobilinogen, UA 09/15/2012 0.2  0.0 - 1.0 mg/dL Final  . Nitrite 21/30/8657 NEGATIVE  NEGATIVE Final  . Leukocytes, UA 09/15/2012 NEGATIVE  NEGATIVE Final   MICROSCOPIC NOT  DONE ON URINES WITH NEGATIVE PROTEIN, BLOOD, LEUKOCYTES, NITRITE, OR GLUCOSE <1000 mg/dL.     X-Rays:Dg Knee 1-2 Views Left  09/23/2012  *RADIOLOGY REPORT*  Clinical Data: Postop  LEFT KNEE - 1-2 VIEW  Comparison: 09/15/2012  Findings: Three views of the left knee submitted.  There is left knee prosthesis  in anatomic alignment. Postsurgical changes are noted with midline skin staples.  Small amount of periarticular soft tissue air.  Post surgical drain.  IMPRESSION: Left hip prosthesis in anatomic alignment.  Postsurgical changes are noted.   Original Report Authenticated By: Natasha Mead, M.D.    Dg Knee 1-2 Views Left  09/15/2012  *RADIOLOGY REPORT*  Clinical Data: Preop left total knee arthroplasty  LEFT KNEE - 1-2 VIEW  Comparison: 09/02/2012  Findings: Moderate to severe tricompartmental degenerative changes, most prominent in the lateral compartment.  No fracture or dislocation is seen.  Small suprapatellar knee joint effusion.  IMPRESSION: Moderate to severe tricompartmental degenerative changes.   Original Report Authenticated By: Charline Bills, M.D.    Dg Knee Complete 4 Views Left  09/02/2012  *RADIOLOGY REPORT*  Clinical Data: Knee swelling and pain.  LEFT KNEE - COMPLETE 4+ VIEW  Comparison: 12/18/2010  Findings: There is a large suprapatellar joint effusion.  There is near complete loss of the joint space along the lateral knee compartment.  Findings are compatible severe osteoarthritic changes.  Difficult to exclude a subtle tibial plateau injury due to the severe degenerative changes and large joint effusion.  IMPRESSION: Severe degenerative changes, particularly along the lateral knee compartment.  The patient has a large joint effusion.  If there is concern for an occult fracture, recommend further evaluation with CT or MRI.   Original Report Authenticated By: Richarda Overlie, M.D.     EKG: Orders placed during the hospital encounter of 09/15/12  . EKG 12-LEAD  . EKG 12-LEAD      Hospital Course: HALIMAH BEWICK is a 58 y.o. who was admitted to [facility]. They were brought to the operating room on 09/23/2012 and underwent Procedure(s): LEFT TOTAL KNEE ARTHROPLASTY.  Patient tolerated the procedure well and was later transferred to the recovery room and then to the orthopaedic floor for postoperative care.  They were given PO and IV analgesics for pain control following their surgery.  They were given 24 hours of postoperative antibiotics of  Anti-infectives   Start     Dose/Rate Route Frequency Ordered Stop   09/23/12 1600  clindamycin (CLEOCIN) IVPB 900 mg     900 mg 100 mL/hr over 30 Minutes Intravenous 3 times per day 09/23/12 1246 09/23/12 2331   09/23/12 1400  ceFAZolin (ANCEF) IVPB 2 g/50 mL premix     2 g 100 mL/hr over 30 Minutes Intravenous Every 6 hours 09/23/12 1146 09/23/12 2025   09/23/12 0812  polymyxin B 500,000 Units, bacitracin 50,000 Units in sodium chloride irrigation 0.9 % 500 mL irrigation  Status:  Discontinued       As needed 09/23/12 0815 09/23/12 1005   09/23/12 0730  clindamycin (CLEOCIN) IVPB 600 mg  Status:  Discontinued     600 mg 100 mL/hr over 30 Minutes Intravenous 3 times per day 09/23/12 0712 09/23/12 1246   09/23/12 0600  clindamycin (CLEOCIN) IVPB 900 mg     900 mg 100 mL/hr over 30 Minutes Intravenous On call to O.R. 09/23/12 1610 09/23/12 0815   09/23/12 0600  ceFAZolin (ANCEF) IVPB 2 g/50 mL premix     2 g 100 mL/hr over 30 Minutes Intravenous On call to O.R. 09/23/12 9604 09/23/12 0746     and started on DVT prophylaxis in the form of Lovenox, TED hose and SCDs.   PT and OT were ordered for total joint protocol.  Discharge planning consulted to help with postop disposition and equipment needs.  Patient had a difficult night on the evening of surgery.  They started to get up OOB with therapy on day one. Hemovac drain was pulled without difficulty.  Continued to work with therapy into day two.  Dressing was changed on day  two and the incision was dry and healing well.  By  day three, the patient had progressed with therapy and meeting their goals.  Incision was healing well.  Patient was seen in rounds and was ready to go home.   Discharge Medications: Prior to Admission medications   Medication Sig Start Date End Date Taking? Authorizing Provider  celecoxib (CELEBREX) 200 MG capsule Take 200 mg by mouth daily.    Yes Historical Provider, MD  cetirizine (ZYRTEC) 10 MG tablet Take 10 mg by mouth daily as needed for allergies.    Yes Historical Provider, MD  Cholecalciferol (VITAMIN D3) 2000 UNITS TABS Take 1 tablet by mouth. TAKES ONE 5 DAYS A WEEK   Yes Historical Provider, MD  gabapentin (NEURONTIN) 100 MG capsule Take 200 mg by mouth 2 (two) times daily as needed (neuropathy --USUALLY TAKES ONE TWICE A DAY).    Yes Historical Provider, MD  zolpidem (AMBIEN) 5 MG tablet Take 5 mg by mouth at bedtime as needed for sleep.    Yes Historical Provider, MD  Ado-Trastuzumab Emtansine (KADCYLA IV) Inject into the vein. 2.5mg /kg/  CHEMOTHERAPY DRUG GIVEN EVERY 3 WEEKS  AT [facility]    Historical Provider, MD  darifenacin (ENABLEX) 7.5 MG 24 hr tablet Take 7.5 mg by mouth every evening.     Historical Provider, MD  docusate sodium 100 MG CAPS Take 100 mg by mouth 2 (two) times daily. 09/26/12   Genelle Gather Babish, PA-C  enoxaparin (LOVENOX) 80 MG/0.8ML injection Inject 0.7 mLs (70 mg total) into the skin daily. 09/26/12   Genelle Gather Babish, PA-C  methocarbamol (ROBAXIN) 500 MG tablet Take 1 tablet (500 mg total) by mouth 3 (three) times daily. 09/23/12   Javier Docker, MD  omeprazole (PRILOSEC) 20 MG capsule Take 20 mg by mouth daily as needed (acid reflux -RARELY TAKES).     Historical Provider, MD  ondansetron (ZOFRAN) 8 MG tablet Take 1 tablet (8 mg total) by mouth every 8 (eight) hours as needed for nausea. 06/19/11   Sanjuana Letters, MD  oxyCODONE-acetaminophen (PERCOCET) 5-325 MG per tablet Take 1-2 tablets by  mouth every 4 (four) hours as needed for pain. 09/23/12   Javier Docker, MD    Diet: Regular diet Activity:WBAT Follow-up:in 10-14 days Disposition - Home Discharged Condition: good   Discharge Orders   Future Appointments Provider Department Dept Phone   12/24/2012 3:45 PM Doe-Hyun Sherran Needs, DO [facility] [place] [phone]   Future Orders Complete By Expires     Call MD / Call 911  As directed     Comments:      If you experience chest pain or shortness of breath, CALL 911 and be transported to the hospital emergency room.  If you develope a fever above 101 F, pus (white drainage) or increased drainage or redness at the wound, or calf pain, call your surgeon's office.    Change dressing  As directed     Comments:      Maintain surgical dressing for 7-10 days, then change the dressing daily with sterile 4 x 4 inch gauze dressing and tape. Keep the area dry and clean.    Constipation Prevention  As directed     Comments:      Drink plenty of fluids.  Prune juice may be helpful.  You may use a stool softener, such as Colace (over the counter) 100 mg twice a day.  Use MiraLax (over the counter) for constipation as needed.    Diet - low sodium heart healthy  As directed  Discharge instructions  As directed     Comments:      Maintain surgical dressing for 7-10 days, then replace with gauze and tape. Keep the area dry and clean until follow up. Follow up in 2 weeks at [facility]. Call with any questions or concerns.    Driving restrictions  As directed     Comments:      No driving for 4 weeks    Increase activity slowly as tolerated  As directed     TED hose  As directed     Comments:      Use stockings (TED hose) for 2 weeks on both leg(s).  You may remove them at night for sleeping.    Weight bearing as tolerated  As directed         Medication List    STOP taking these medications       HYDROcodone-acetaminophen 5-325 MG per tablet  Commonly known  as:  NORCO     ibuprofen 200 MG tablet  Commonly known as:  ADVIL,MOTRIN      TAKE these medications       celecoxib 200 MG capsule  Commonly known as:  CELEBREX  Take 200 mg by mouth daily.     cetirizine 10 MG tablet  Commonly known as:  ZYRTEC  Take 10 mg by mouth daily as needed for allergies.     darifenacin 7.5 MG 24 hr tablet  Commonly known as:  ENABLEX  Take 7.5 mg by mouth every evening.     DSS 100 MG Caps  Take 100 mg by mouth 2 (two) times daily.     enoxaparin 80 MG/0.8ML injection  Commonly known as:  LOVENOX  Inject 0.7 mLs (70 mg total) into the skin daily.     gabapentin 100 MG capsule  Commonly known as:  NEURONTIN  Take 200 mg by mouth 2 (two) times daily as needed (neuropathy --USUALLY TAKES ONE TWICE A DAY).     KADCYLA IV  Inject into the vein. 2.5mg /kg/  CHEMOTHERAPY DRUG GIVEN EVERY 3 WEEKS  AT [facility]     methocarbamol 500 MG tablet  Commonly known as:  ROBAXIN  Take 1 tablet (500 mg total) by mouth 3 (three) times daily.     omeprazole 20 MG capsule  Commonly known as:  PRILOSEC  Take 20 mg by mouth daily as needed (acid reflux -RARELY TAKES).     ondansetron 8 MG tablet  Commonly known as:  ZOFRAN  Take 1 tablet (8 mg total) by mouth every 8 (eight) hours as needed for nausea.     oxyCODONE-acetaminophen 5-325 MG per tablet  Commonly known as:  PERCOCET  Take 1-2 tablets by mouth every 4 (four) hours as needed for pain.     Vitamin D3 2000 UNITS Tabs  Take 1 tablet by mouth. TAKES ONE 5 DAYS A WEEK     zolpidem 5 MG tablet  Commonly known as:  AMBIEN  Take 5 mg by mouth at bedtime as needed for sleep.           Follow-up Information   Follow up with BEANE,JEFFREY C, MD In 2 weeks.   Contact information:   [address] [place] [address] [phone]       Signed: Dorothy Spark. 09/27/2012, 12:48 PM

## 2012-10-12 ENCOUNTER — Telehealth: Payer: Self-pay | Admitting: Internal Medicine

## 2012-10-12 ENCOUNTER — Ambulatory Visit: Payer: 59 | Attending: Orthopedic Surgery

## 2012-10-12 DIAGNOSIS — R262 Difficulty in walking, not elsewhere classified: Secondary | ICD-10-CM | POA: Insufficient documentation

## 2012-10-12 DIAGNOSIS — R609 Edema, unspecified: Secondary | ICD-10-CM | POA: Insufficient documentation

## 2012-10-12 DIAGNOSIS — IMO0001 Reserved for inherently not codable concepts without codable children: Secondary | ICD-10-CM | POA: Insufficient documentation

## 2012-10-12 DIAGNOSIS — M6281 Muscle weakness (generalized): Secondary | ICD-10-CM | POA: Insufficient documentation

## 2012-10-12 DIAGNOSIS — R5381 Other malaise: Secondary | ICD-10-CM | POA: Insufficient documentation

## 2012-10-12 DIAGNOSIS — Z96659 Presence of unspecified artificial knee joint: Secondary | ICD-10-CM | POA: Insufficient documentation

## 2012-10-12 DIAGNOSIS — M25569 Pain in unspecified knee: Secondary | ICD-10-CM | POA: Insufficient documentation

## 2012-10-12 NOTE — Telephone Encounter (Signed)
Pt to report to you: hemoglobin is up to 10.2. Hematocrit   32

## 2012-10-12 NOTE — Telephone Encounter (Signed)
Noted  

## 2012-10-14 ENCOUNTER — Ambulatory Visit: Payer: 59

## 2012-10-19 ENCOUNTER — Ambulatory Visit: Payer: 59 | Admitting: Physical Therapy

## 2012-10-20 ENCOUNTER — Ambulatory Visit: Payer: 59

## 2012-10-21 ENCOUNTER — Ambulatory Visit: Payer: 59

## 2012-10-25 ENCOUNTER — Ambulatory Visit: Payer: 59 | Attending: Orthopedic Surgery | Admitting: Physical Therapy

## 2012-10-25 DIAGNOSIS — R262 Difficulty in walking, not elsewhere classified: Secondary | ICD-10-CM | POA: Insufficient documentation

## 2012-10-25 DIAGNOSIS — R609 Edema, unspecified: Secondary | ICD-10-CM | POA: Insufficient documentation

## 2012-10-25 DIAGNOSIS — R5381 Other malaise: Secondary | ICD-10-CM | POA: Insufficient documentation

## 2012-10-25 DIAGNOSIS — M25569 Pain in unspecified knee: Secondary | ICD-10-CM | POA: Insufficient documentation

## 2012-10-25 DIAGNOSIS — IMO0001 Reserved for inherently not codable concepts without codable children: Secondary | ICD-10-CM | POA: Insufficient documentation

## 2012-10-25 DIAGNOSIS — M6281 Muscle weakness (generalized): Secondary | ICD-10-CM | POA: Insufficient documentation

## 2012-10-27 ENCOUNTER — Ambulatory Visit: Payer: 59

## 2012-11-02 ENCOUNTER — Ambulatory Visit: Payer: 59 | Admitting: Physical Therapy

## 2012-11-04 ENCOUNTER — Ambulatory Visit: Payer: 59 | Admitting: Physical Therapy

## 2012-11-08 ENCOUNTER — Ambulatory Visit: Payer: 59 | Admitting: Physical Therapy

## 2012-11-09 ENCOUNTER — Ambulatory Visit: Payer: 59

## 2012-11-11 ENCOUNTER — Ambulatory Visit: Payer: 59

## 2012-11-16 ENCOUNTER — Ambulatory Visit: Payer: 59 | Admitting: Physical Therapy

## 2012-11-18 ENCOUNTER — Ambulatory Visit: Payer: 59

## 2012-11-23 ENCOUNTER — Ambulatory Visit: Payer: 59 | Attending: Orthopedic Surgery | Admitting: Physical Therapy

## 2012-11-23 DIAGNOSIS — Z96659 Presence of unspecified artificial knee joint: Secondary | ICD-10-CM | POA: Insufficient documentation

## 2012-11-23 DIAGNOSIS — M25569 Pain in unspecified knee: Secondary | ICD-10-CM | POA: Insufficient documentation

## 2012-11-23 DIAGNOSIS — M6281 Muscle weakness (generalized): Secondary | ICD-10-CM | POA: Insufficient documentation

## 2012-11-23 DIAGNOSIS — IMO0001 Reserved for inherently not codable concepts without codable children: Secondary | ICD-10-CM | POA: Insufficient documentation

## 2012-11-23 DIAGNOSIS — R262 Difficulty in walking, not elsewhere classified: Secondary | ICD-10-CM | POA: Insufficient documentation

## 2012-11-23 DIAGNOSIS — R5381 Other malaise: Secondary | ICD-10-CM | POA: Insufficient documentation

## 2012-11-23 DIAGNOSIS — R609 Edema, unspecified: Secondary | ICD-10-CM | POA: Insufficient documentation

## 2012-11-25 ENCOUNTER — Ambulatory Visit: Payer: 59 | Admitting: Physical Therapy

## 2012-11-30 ENCOUNTER — Ambulatory Visit: Payer: 59

## 2012-12-02 ENCOUNTER — Ambulatory Visit: Payer: 59

## 2012-12-06 ENCOUNTER — Other Ambulatory Visit: Payer: Self-pay | Admitting: Internal Medicine

## 2012-12-06 NOTE — Telephone Encounter (Signed)
Pt wants to increase the dose on tramadol.  She is attempting to use less of the vicodin and therefore is needing more tramadol.  Is that ok?

## 2012-12-07 ENCOUNTER — Ambulatory Visit: Payer: 59 | Admitting: Physical Therapy

## 2012-12-07 ENCOUNTER — Encounter: Payer: Self-pay | Admitting: Internal Medicine

## 2012-12-09 ENCOUNTER — Ambulatory Visit: Payer: 59

## 2012-12-14 ENCOUNTER — Encounter: Payer: 59 | Admitting: Physical Therapy

## 2012-12-24 ENCOUNTER — Ambulatory Visit (INDEPENDENT_AMBULATORY_CARE_PROVIDER_SITE_OTHER): Payer: 59 | Admitting: Internal Medicine

## 2012-12-24 ENCOUNTER — Encounter: Payer: Self-pay | Admitting: Internal Medicine

## 2012-12-24 VITALS — BP 140/76 | HR 100 | Temp 99.0°F | Wt 174.0 lb

## 2012-12-24 DIAGNOSIS — C50919 Malignant neoplasm of unspecified site of unspecified female breast: Secondary | ICD-10-CM

## 2012-12-24 DIAGNOSIS — D62 Acute posthemorrhagic anemia: Secondary | ICD-10-CM | POA: Insufficient documentation

## 2012-12-24 DIAGNOSIS — C50912 Malignant neoplasm of unspecified site of left female breast: Secondary | ICD-10-CM

## 2012-12-24 DIAGNOSIS — IMO0002 Reserved for concepts with insufficient information to code with codable children: Secondary | ICD-10-CM

## 2012-12-24 DIAGNOSIS — M1710 Unilateral primary osteoarthritis, unspecified knee: Secondary | ICD-10-CM

## 2012-12-24 MED ORDER — LUBIPROSTONE 8 MCG PO CAPS: 8.0000 ug | ORAL_CAPSULE | Freq: Two times a day (BID) | ORAL | Status: DC

## 2012-12-24 MED ORDER — OXYCODONE-ACETAMINOPHEN 5-325 MG PO TABS: 1.0000 | ORAL_TABLET | ORAL | Status: DC | PRN

## 2012-12-24 NOTE — Assessment & Plan Note (Signed)
Patient is status post left knee replacement. She has residual pain and swelling. She is followed by Dr. Jillyn Hidden. Continue using tramadol for mild pain and 1/2 of Percocet for more severe pain. She has associated constipation with narcotic use. Use amitiza 8 mcg twice daily.

## 2012-12-24 NOTE — Progress Notes (Signed)
Subjective:    Patient ID: [name], female    DOB: 1956/01/26, 57 y.o.   MRN: [id number]  HPI  57 year old white female with history of metastatic breast cancer followup. Interval medical history-patient underwent total knee replacement of left knee. She completed rehabilitation. She still has intermittent left knee pain and swelling. She is followed by Dr. Jillyn Hidden. She usually uses tramadol as needed but occasionally uses half of Percocet for knee pain.  She also recently finished radiation treatments on right side of her chest. She has significant discomfort at times because of side effects from radiation therapy.  She has associated constipation with pain medication use.  After her knee surgery, she had blood loss anemia. Patient reports her hemoglobin has improved to above 10. She rarely eats red meat. She is not taking iron supplement.  She wants to return to work.  She is starting with reduced hours.  Her stamina is deteriorating.  Review of Systems Positive for constipation  Past Medical History  Diagnosis Date  . Elevated blood pressure reading without diagnosis of hypertension     PT MONITORS HER B/P AT HOME  . 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 Arthritis KNEES  . 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 Chronic anticoagulation HX  PORT-A-CATH CLOT    2010 - HAS BEEN ON LOVENOX SINCE THE CLOT  . Skin cancer of anterior chest BREAST CANCER PRIMARY W/ METS TO CHEST WALL SKIN CANCER--  CHEMOTHERAPY EVERY 3 WEEKS AT [facility]    History   Social History  . Marital Status: Married    Spouse Name: N/A    Number of Children: 3  . Years of Education: N/A   Occupational History  . 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 Smoking status: Never Smoker   . Smokeless tobacco: Never Used  . Alcohol Use: No  . Drug Use: No   . Sexually Active: Not on file   Other Topics Concern  . 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          Past Surgical History  Procedure Laterality Date  . Left modified radical mastectomy/ right total mastectomy  11-13-2007    LEFT BREAST CANCER W/ AXILLARY LYMPH NODE METASTASIS AND POST NEOADJUVANT CHEMO  . Placement port-a-cath  06-22-2007  . Transthoracic echocardiogram  11-18-2011  DR BENSIMHON (ECHO EVERY 3 MONTHS)    HX CHEMO INDUCED CARDIOTOXICITY/  LVSF NORMAL/ EF 55-50%/ MILD MITRIAL VALVE REGURG./ MILDLY INCREASED SYSTOLIC PRESSURE OF PULMONARY ARTERIES  . Knee arthroscopy  02/13/2012    Procedure: ARTHROSCOPY KNEE;  Surgeon: Javier Docker, MD;  Location: [facility];  Service: Orthopedics;  Laterality: Left;  WITH DEBRIDEMENt   . Knee arthroscopy with lateral menisectomy  02/13/2012    Procedure: KNEE ARTHROSCOPY WITH LATERAL MENISECTOMY;  Surgeon: Javier Docker, MD;  Location: [facility];  Service: Orthopedics;;  partial  . Total knee arthroplasty Left 09/23/2012    Procedure: LEFT TOTAL KNEE ARTHROPLASTY;  Surgeon: Javier Docker, MD;  Location: [facility];  Service: Orthopedics;  Laterality:  Left;    Family History  Problem Relation Age of Onset  . Heart disease Mother     due to mitral valve regurgiation,   . Cancer Mother     breast  . Parkinsonism Father   . Stroke Sister   . Alcohol abuse Paternal Grandfather     Allergies  Allergen Reactions  . Tape Hives    Can tolerate paper tape  . Dilaudid (Hydromorphone Hcl) Nausea And Vomiting  . Penicillins Rash    Denies airway involvement    Current Outpatient Prescriptions on File Prior to Visit  Medication Sig Dispense Refill  . Ado-Trastuzumab Emtansine (KADCYLA IV) Inject into the vein. 2.5mg /kg/  CHEMOTHERAPY DRUG GIVEN  EVERY 3 WEEKS  AT [facility]      . celecoxib (CELEBREX) 200 MG capsule Take 200 mg by mouth daily.       . cetirizine (ZYRTEC) 10 MG tablet Take 10 mg by mouth daily as needed for allergies.       . Cholecalciferol (VITAMIN D3) 2000 UNITS TABS Take 1 tablet by mouth. TAKES ONE 5 DAYS A WEEK      . darifenacin (ENABLEX) 7.5 MG 24 hr tablet Take 7.5 mg by mouth every evening.       . docusate sodium 100 MG CAPS Take 100 mg by mouth 2 (two) times daily.  10 capsule    . enoxaparin (LOVENOX) 80 MG/0.8ML injection Inject 0.7 mLs (70 mg total) into the skin daily.  0 Syringe    . gabapentin (NEURONTIN) 100 MG capsule Take 200 mg by mouth 2 (two) times daily as needed (neuropathy --USUALLY TAKES ONE TWICE A DAY).       [name] omeprazole (PRILOSEC) 20 MG capsule Take 20 mg by mouth daily as needed (acid reflux -RARELY TAKES).       . ondansetron (ZOFRAN) 8 MG tablet Take 1 tablet (8 mg total) by mouth every 8 (eight) hours as needed for nausea.      . traMADol (ULTRAM) 50 MG tablet TAKE 1 TABLET BY MOUTH EVERY 12 HOURS AS NEEDED FOR PAIN.  60 tablet  2  . zolpidem (AMBIEN) 5 MG tablet Take 5 mg by mouth at bedtime as needed for sleep.        No current facility-administered medications on file prior to visit.    BP 140/76  Pulse 100  Temp(Src) 99 F (37.2 C) (Oral)  Wt 174 lb (78.926 kg)  BMI 26.46 kg/m2  LMP 06/22/2007       Objective:   Physical Exam  Constitutional: She is oriented to person, place, and time. She appears well-developed and well-nourished.  Cardiovascular: Normal rate, regular rhythm and normal heart sounds.   No murmur heard. Pulmonary/Chest: Effort normal and breath sounds normal. She has no wheezes.  Musculoskeletal:  Left knee swelling.  Slightly warm to touch  Neurological: She is alert and oriented to person, place, and time. No cranial nerve deficit.          Assessment & Plan:

## 2012-12-24 NOTE — Assessment & Plan Note (Signed)
Blood loss anemia improving. Her last hemoglobin was approximately 10. Patient advised to take over-the-counter iron supplement for 1 to 2 months.  Her CBC is monitored every 3 weeks by oncologist.

## 2012-12-24 NOTE — Assessment & Plan Note (Signed)
She wants to continue working for as long as she can.  Counseled pt on planning for disability / retirement.

## 2012-12-24 NOTE — Patient Instructions (Signed)
Take iron supplement OTC (SlowFe) for 1-2 months

## 2012-12-29 ENCOUNTER — Telehealth: Payer: Self-pay | Admitting: Internal Medicine

## 2012-12-29 NOTE — Telephone Encounter (Signed)
PT states that she is out of her zolpidem (AMBIEN) 5 MG tablet. She would like Dr. Artist Pais to take over these prescriptions instead of the oncologist at [facility]. She would like this called into [facility]. Please assist.

## 2012-12-30 MED ORDER — ZOLPIDEM TARTRATE 5 MG PO TABS: 5.0000 mg | ORAL_TABLET | Freq: Every evening | ORAL | Status: DC | PRN

## 2012-12-30 NOTE — Telephone Encounter (Signed)
Ok to refill x 3 

## 2012-12-30 NOTE — Telephone Encounter (Signed)
Rx called into pharmacy and Left message on machine for patient

## 2013-02-17 ENCOUNTER — Telehealth: Payer: Self-pay | Admitting: Internal Medicine

## 2013-02-17 MED ORDER — OXYCODONE-ACETAMINOPHEN 5-325 MG PO TABS: 1.0000 | ORAL_TABLET | ORAL | Status: DC | PRN

## 2013-02-17 NOTE — Telephone Encounter (Signed)
Patient requesting refill on oxyCODONE-acetaminophen (PERCOCET) 5-325 MG per tablet Please call when ready for pick up.

## 2013-02-17 NOTE — Telephone Encounter (Signed)
Ok to refill x 1  

## 2013-02-18 NOTE — Telephone Encounter (Signed)
rx up front, husband will pick up

## 2013-02-24 ENCOUNTER — Other Ambulatory Visit: Payer: 59

## 2013-03-03 ENCOUNTER — Encounter: Payer: 59 | Admitting: Internal Medicine

## 2013-03-09 ENCOUNTER — Other Ambulatory Visit (HOSPITAL_COMMUNITY): Payer: Self-pay | Admitting: Anesthesiology

## 2013-03-09 DIAGNOSIS — C50919 Malignant neoplasm of unspecified site of unspecified female breast: Secondary | ICD-10-CM

## 2013-03-11 ENCOUNTER — Telehealth (HOSPITAL_COMMUNITY): Payer: Self-pay | Admitting: Cardiology

## 2013-03-11 DIAGNOSIS — C50919 Malignant neoplasm of unspecified site of unspecified female breast: Secondary | ICD-10-CM

## 2013-03-11 NOTE — Telephone Encounter (Signed)
ORDER PLACED FOR ROUTINE FOLLOW UP 2D ECHO. PT IS BEING FOLLOWED DURING CHEMO THERAPY

## 2013-03-22 ENCOUNTER — Encounter: Payer: Self-pay | Admitting: Internal Medicine

## 2013-03-22 ENCOUNTER — Ambulatory Visit (HOSPITAL_COMMUNITY): Payer: 59 | Attending: Internal Medicine | Admitting: Radiology

## 2013-03-22 ENCOUNTER — Other Ambulatory Visit (HOSPITAL_COMMUNITY): Payer: Self-pay | Admitting: Internal Medicine

## 2013-03-22 DIAGNOSIS — I428 Other cardiomyopathies: Secondary | ICD-10-CM

## 2013-03-22 DIAGNOSIS — I079 Rheumatic tricuspid valve disease, unspecified: Secondary | ICD-10-CM | POA: Insufficient documentation

## 2013-03-22 DIAGNOSIS — I059 Rheumatic mitral valve disease, unspecified: Secondary | ICD-10-CM | POA: Insufficient documentation

## 2013-03-22 DIAGNOSIS — C50919 Malignant neoplasm of unspecified site of unspecified female breast: Secondary | ICD-10-CM | POA: Insufficient documentation

## 2013-03-22 NOTE — Progress Notes (Signed)
Echocardiogram performed.  

## 2013-03-23 ENCOUNTER — Telehealth: Payer: Self-pay | Admitting: Internal Medicine

## 2013-03-23 MED ORDER — PREGABALIN 100 MG PO CAPS: 100.0000 mg | ORAL_CAPSULE | Freq: Two times a day (BID) | ORAL | Status: DC

## 2013-03-23 NOTE — Telephone Encounter (Signed)
Pt stopped by to fu on her new rx. Lyrica. Dr Artist Pais states it would be called in. Pharm has not received. [facility] [address]. pls advise

## 2013-03-23 NOTE — Telephone Encounter (Signed)
rx called in, pt aware 

## 2013-03-30 ENCOUNTER — Other Ambulatory Visit: Payer: Self-pay | Admitting: Internal Medicine

## 2013-03-31 ENCOUNTER — Other Ambulatory Visit: Payer: Self-pay

## 2013-04-01 ENCOUNTER — Encounter: Payer: Self-pay | Admitting: Internal Medicine

## 2013-04-01 NOTE — Telephone Encounter (Signed)
Pt following up on ambien 5mg  refill request due to pharm closed on weekends [facility]

## 2013-04-04 ENCOUNTER — Other Ambulatory Visit: Payer: Self-pay | Admitting: Internal Medicine

## 2013-04-04 MED ORDER — PREGABALIN 50 MG PO CAPS: 50.0000 mg | ORAL_CAPSULE | Freq: Two times a day (BID) | ORAL | Status: DC

## 2013-04-06 ENCOUNTER — Encounter: Payer: Self-pay | Admitting: Internal Medicine

## 2013-04-20 ENCOUNTER — Telehealth: Payer: Self-pay | Admitting: Internal Medicine

## 2013-04-20 MED ORDER — GABAPENTIN 100 MG PO CAPS: 100.0000 mg | ORAL_CAPSULE | Freq: Three times a day (TID) | ORAL | Status: DC

## 2013-04-20 NOTE — Telephone Encounter (Signed)
[name]/Patient Phone [phone] called to request medication change.  Reports Lyrica 100 mg made her dizzy and 50 mg is not effective.  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  Declined triage. If MD willing to change medication, please [facility].  If MD wants her seen, please call back.

## 2013-04-27 ENCOUNTER — Ambulatory Visit: Payer: 59 | Admitting: Family Medicine

## 2013-04-28 ENCOUNTER — Ambulatory Visit (INDEPENDENT_AMBULATORY_CARE_PROVIDER_SITE_OTHER): Payer: 59 | Admitting: Family Medicine

## 2013-04-28 ENCOUNTER — Encounter: Payer: Self-pay | Admitting: Family Medicine

## 2013-04-28 VITALS — BP 144/74 | Temp 100.2°F | Wt 169.0 lb

## 2013-04-28 DIAGNOSIS — J329 Chronic sinusitis, unspecified: Secondary | ICD-10-CM

## 2013-04-28 MED ORDER — AZITHROMYCIN 250 MG PO TABS: ORAL_TABLET | ORAL | Status: DC

## 2013-04-28 NOTE — Progress Notes (Signed)
Chief Complaint  Patient presents with  . Sinusitis    HPI:  -started: about 2 weeks ago -symptoms:nasal congestion, sore throat, cough, recently some sinus discomfort R max and R ear -denies:fever, SOB, NVD, tooth pain -has tried: doxy for 2 days but then had rashand her radiation oncologist said this was radiation recall and can not take tetracyclines, can take any other abx, but allergic to doxy -sick contacts/travel/risks: denies flu exposure, tick exposure or or Ebola risks -Hx of: sinusitis ROS: See pertinent positives and negatives per HPI.  Past Medical History  Diagnosis Date  . Elevated blood pressure reading without diagnosis of hypertension     PT MONITORS HER B/P AT HOME  . 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 Arthritis KNEES  . 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 Chronic anticoagulation HX  PORT-A-CATH CLOT    2010 - HAS BEEN ON LOVENOX SINCE THE CLOT  . Skin cancer of anterior chest BREAST CANCER PRIMARY W/ METS TO CHEST WALL SKIN CANCER--  CHEMOTHERAPY EVERY 3 WEEKS AT [facility]    Past Surgical History  Procedure Laterality Date  . Left modified radical mastectomy/ right total mastectomy  11-13-2007    LEFT BREAST CANCER W/ AXILLARY LYMPH NODE METASTASIS AND POST NEOADJUVANT CHEMO  . Placement port-a-cath  06-22-2007  . Transthoracic echocardiogram  11-18-2011  DR BENSIMHON (ECHO EVERY 3 MONTHS)    HX CHEMO INDUCED CARDIOTOXICITY/  LVSF NORMAL/ EF 55-50%/ MILD MITRIAL VALVE REGURG./ MILDLY INCREASED SYSTOLIC PRESSURE OF PULMONARY ARTERIES  . Knee arthroscopy  02/13/2012    Procedure: ARTHROSCOPY KNEE;  Surgeon: Javier Docker, MD;  Location: [facility];  Service: Orthopedics;  Laterality: Left;  WITH DEBRIDEMENt   . Knee arthroscopy with lateral menisectomy  02/13/2012    Procedure: KNEE ARTHROSCOPY WITH  LATERAL MENISECTOMY;  Surgeon: Javier Docker, MD;  Location: [facility];  Service: Orthopedics;;  partial  . Total knee arthroplasty Left 09/23/2012    Procedure: LEFT TOTAL KNEE ARTHROPLASTY;  Surgeon: Javier Docker, MD;  Location: [facility];  Service: Orthopedics;  Laterality: Left;    Family History  Problem Relation Age of Onset  . Heart disease Mother     due to mitral valve regurgiation,   . Cancer Mother     breast  . Parkinsonism Father   . Stroke Sister   . Alcohol abuse Paternal Grandfather     History   Social History  . Marital Status: Married    Spouse Name: N/A    Number of Children: 3  . Years of Education: N/A   Occupational History  . 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 Smoking status: Never Smoker   . Smokeless tobacco: Never Used  . Alcohol Use: No  . Drug Use: No  . Sexual Activity: None   Other Topics Concern  . 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          Current outpatient prescriptions:Ado-Trastuzumab Emtansine (KADCYLA IV), Inject into the vein. 2.5mg /kg/  CHEMOTHERAPY DRUG GIVEN EVERY 3 WEEKS  AT [facility], Disp: ,  Rfl: ;  celecoxib (CELEBREX) 200 MG capsule, Take 200 mg by mouth daily. , Disp: , Rfl: ;  cetirizine (ZYRTEC) 10 MG tablet, Take 10 mg by mouth daily as needed for allergies. , Disp: , Rfl:  Cholecalciferol (VITAMIN D3) 2000 UNITS TABS, Take 1 tablet by mouth. TAKES ONE 5 DAYS A WEEK, Disp: , Rfl: ;  darifenacin (ENABLEX) 7.5 MG 24 hr tablet, Take 7.5 mg by mouth every evening. , Disp: , Rfl: ;  docusate sodium 100 MG CAPS, Take 100 mg by mouth 2 (two) times daily., Disp: 10 capsule, Rfl: ;  enoxaparin (LOVENOX) 80 MG/0.8ML injection, Inject 0.7 mLs (70 mg total) into the skin daily., Disp: 0 Syringe, Rfl:  gabapentin (NEURONTIN) 100 MG capsule, Take 1  capsule (100 mg total) by mouth 3 (three) times daily., Disp: 90 capsule, Rfl: 5;  lubiprostone (AMITIZA) 8 MCG capsule, Take 1 capsule (8 mcg total) by mouth 2 (two) times daily with a meal., Disp: 30 capsule, Rfl: 0;  omeprazole (PRILOSEC) 20 MG capsule, Take 20 mg by mouth daily as needed (acid reflux -RARELY TAKES). , Disp: , Rfl:  ondansetron (ZOFRAN) 8 MG tablet, Take 1 tablet (8 mg total) by mouth every 8 (eight) hours as needed for nausea., Disp: , Rfl: ;  oxyCODONE-acetaminophen (PERCOCET) 5-325 MG per tablet, Take 1-2 tablets by mouth every 4 (four) hours as needed for pain., Disp: 30 tablet, Rfl: 0;  traMADol (ULTRAM) 50 MG tablet, TAKE 1 TABLET BY MOUTH EVERY 12 HOURS AS NEEDED FOR PAIN., Disp: 60 tablet, Rfl: 2 zolpidem (AMBIEN) 5 MG tablet, TAKE 1 TABLET BY MOUTH AT BEDTIME AS NEEDED, Disp: 30 tablet, Rfl: 3;  azithromycin (ZITHROMAX) 250 MG tablet, 2 tabe on day one the 1 tab daily for 4 days, Disp: 6 tablet, Rfl: 0  EXAM:  Filed Vitals:   04/28/13 1623  BP: 144/74  Temp: 100.2 F (37.9 C)    Body mass index is 25.7 kg/(m^2).  GENERAL: vitals reviewed and listed above, alert, oriented, appears well hydrated and in no acute distress  HEENT: atraumatic, conjunttiva clear, no obvious abnormalities on inspection of external nose and ears, normal appearance of ear canals and TMs, clear nasal congestion, mild post oropharyngeal erythema with PND, no tonsillar edema or exudate, no sinus TTP  NECK: no obvious masses on inspection  LUNGS: clear to auscultation bilaterally, no wheezes, rales or rhonchi, good air movement  CV: HRRR, no peripheral edema  MS: moves all extremities without noticeable abnormality  PSYCH: pleasant and cooperative, no obvious depression or anxiety  ASSESSMENT AND PLAN:  Discussed the following assessment and plan:  Sinusitis - Plan: azithromycin (ZITHROMAX) 250 MG tablet  -abx risks discussed -return precautions -advised to follow up with her  oncologist about the rash/radiation recall if worsening or persists - low grade temp and sig rash on chest - but pt reports she just saw onc and rash has not worsened whom said this is normal for the radiation recall reaction and she will follow up with them if worsens -of course, we advised to return or notify a doctor immediately if symptoms worsen or persist or new concerns arise.    There are no Patient Instructions on file for this visit.   Kriste Basque R.

## 2013-05-03 ENCOUNTER — Other Ambulatory Visit: Payer: Self-pay | Admitting: Internal Medicine

## 2013-05-04 ENCOUNTER — Ambulatory Visit: Payer: 59 | Admitting: Internal Medicine

## 2013-05-19 ENCOUNTER — Encounter: Payer: Self-pay | Admitting: Internal Medicine

## 2013-05-20 MED ORDER — GABAPENTIN 300 MG PO CAPS: 300.0000 mg | ORAL_CAPSULE | Freq: Three times a day (TID) | ORAL | Status: DC

## 2013-05-30 ENCOUNTER — Encounter: Payer: Self-pay | Admitting: Internal Medicine

## 2013-05-31 MED ORDER — CELECOXIB 200 MG PO CAPS: 200.0000 mg | ORAL_CAPSULE | Freq: Two times a day (BID) | ORAL | Status: DC

## 2013-06-02 ENCOUNTER — Encounter: Payer: Self-pay | Admitting: Internal Medicine

## 2013-06-29 ENCOUNTER — Ambulatory Visit: Payer: Self-pay | Admitting: Podiatrist

## 2013-08-04 ENCOUNTER — Encounter: Payer: Self-pay | Admitting: Internal Medicine

## 2013-08-04 MED ORDER — ZOLPIDEM TARTRATE 5 MG PO TABS: ORAL_TABLET | ORAL | Status: DC

## 2013-08-17 ENCOUNTER — Ambulatory Visit (INDEPENDENT_AMBULATORY_CARE_PROVIDER_SITE_OTHER): Payer: 59 | Admitting: Podiatrist

## 2013-08-17 ENCOUNTER — Encounter: Payer: Self-pay | Admitting: Podiatrist

## 2013-08-17 VITALS — BP 100/62 | HR 88 | Resp 16 | Ht 67.0 in | Wt 161.0 lb

## 2013-08-17 DIAGNOSIS — M216X9 Other acquired deformities of unspecified foot: Secondary | ICD-10-CM

## 2013-08-17 DIAGNOSIS — Q828 Other specified congenital malformations of skin: Secondary | ICD-10-CM

## 2013-08-17 NOTE — Patient Instructions (Signed)

## 2013-08-17 NOTE — Progress Notes (Signed)
   Subjective:    Patient ID: [name], female    DOB: 1955-10-25, 58 y.o.   MRN: [id number]  HPI Comments: "I have these calluses"  Patient c/o tender calluses plantar right foot for about 6 months. She has been using a pumice stone to thin out. Hasn't really helped too much. When they are thicker, painful to walk.     Review of Systems  HENT: Positive for sinus pressure.   Eyes: Positive for redness.  Allergic/Immunologic: Positive for environmental allergies.  Neurological: Positive for numbness.  All other systems reviewed and are negative.       Objective:   Physical Exam GENERAL APPEARANCE: Alert, conversant. Appropriately groomed. No acute distress.  VASCULAR: Pedal pulses palpable at 2/4 DP and PT bilateral.  Capillary refill time is immediate to all digits,  Proximal to distal cooling it warm to warm.  Digital hair growth is present bilateral  NEUROLOGIC: sensation is intact epicritically and protectively to 5.07 monofilament at 5/5 sites bilateral.  Light touch is intact bilateral, vibratory sensation intact bilateral, achilles tendon reflex is intact bilateral.  MUSCULOSKELETAL: prominent 1st and 5th metatarsal heads present right foot.  Otherwise acceptable muscle strength, tone and stability bilateral.  Intrinsic muscluature intact bilateral.   DERMATOLOGIC: intractable hyperkeratotic lesions present submet 1, submet 3, submet 5 on the right foot.  All areas have a porokeratotic/waxy appearance with pain on direct and lateral pressure.  Left foot skin color, texture, and turger are within normal limits.     Assessment & Plan:  Porokeratotic lesions x 6 right foot Plan:  debrided the lesions with a 15 blade without anesthesia.  Applied salinocaine and a pad and she will remove this dressing in 24 hours.  She will be seen back as needed for followup

## 2013-09-16 ENCOUNTER — Telehealth (HOSPITAL_COMMUNITY): Payer: Self-pay | Admitting: *Deleted

## 2013-09-16 DIAGNOSIS — C50919 Malignant neoplasm of unspecified site of unspecified female breast: Secondary | ICD-10-CM

## 2013-09-16 NOTE — Telephone Encounter (Signed)
Order for echo placed per Dr Bensimhon 

## 2013-10-07 ENCOUNTER — Other Ambulatory Visit (HOSPITAL_COMMUNITY): Payer: Self-pay | Admitting: Radiology

## 2013-10-07 ENCOUNTER — Ambulatory Visit (HOSPITAL_COMMUNITY): Payer: 59 | Attending: Cardiology | Admitting: Radiology

## 2013-10-07 DIAGNOSIS — I428 Other cardiomyopathies: Secondary | ICD-10-CM | POA: Insufficient documentation

## 2013-10-07 DIAGNOSIS — I429 Cardiomyopathy, unspecified: Secondary | ICD-10-CM

## 2013-10-07 NOTE — Progress Notes (Signed)
Echocardiogram performed.  

## 2013-10-12 ENCOUNTER — Encounter: Payer: Self-pay | Admitting: Internal Medicine

## 2013-11-03 ENCOUNTER — Ambulatory Visit (HOSPITAL_COMMUNITY)
Admission: RE | Admit: 2013-11-03 | Discharge: 2013-11-03 | Disposition: A | Discharge status: Home / Self Care | Payer: 59 | Source: Ambulatory Visit | Attending: Internal Medicine | Admitting: Internal Medicine

## 2013-11-03 ENCOUNTER — Encounter (HOSPITAL_COMMUNITY): Payer: Self-pay

## 2013-11-03 VITALS — BP 108/60 | HR 102 | Ht 67.0 in | Wt 172.8 lb

## 2013-11-03 DIAGNOSIS — I471 Supraventricular tachycardia: Secondary | ICD-10-CM

## 2013-11-03 DIAGNOSIS — I498 Other specified cardiac arrhythmias: Secondary | ICD-10-CM | POA: Insufficient documentation

## 2013-11-03 DIAGNOSIS — Z9010 Acquired absence of unspecified breast and nipple: Secondary | ICD-10-CM | POA: Insufficient documentation

## 2013-11-03 DIAGNOSIS — R0600 Dyspnea, unspecified: Secondary | ICD-10-CM

## 2013-11-03 DIAGNOSIS — Z923 Personal history of irradiation: Secondary | ICD-10-CM | POA: Insufficient documentation

## 2013-11-03 DIAGNOSIS — Z7901 Long term (current) use of anticoagulants: Secondary | ICD-10-CM | POA: Insufficient documentation

## 2013-11-03 DIAGNOSIS — R000 Tachycardia, unspecified: Secondary | ICD-10-CM | POA: Insufficient documentation

## 2013-11-03 DIAGNOSIS — C792 Secondary malignant neoplasm of skin: Secondary | ICD-10-CM | POA: Insufficient documentation

## 2013-11-03 DIAGNOSIS — D649 Anemia, unspecified: Secondary | ICD-10-CM | POA: Insufficient documentation

## 2013-11-03 DIAGNOSIS — R0609 Other forms of dyspnea: Secondary | ICD-10-CM | POA: Insufficient documentation

## 2013-11-03 DIAGNOSIS — C50919 Malignant neoplasm of unspecified site of unspecified female breast: Secondary | ICD-10-CM | POA: Insufficient documentation

## 2013-11-03 DIAGNOSIS — Z79899 Other long term (current) drug therapy: Secondary | ICD-10-CM | POA: Insufficient documentation

## 2013-11-03 DIAGNOSIS — R0989 Other specified symptoms and signs involving the circulatory and respiratory systems: Secondary | ICD-10-CM | POA: Insufficient documentation

## 2013-11-03 DIAGNOSIS — R0602 Shortness of breath: Secondary | ICD-10-CM | POA: Insufficient documentation

## 2013-11-03 NOTE — Progress Notes (Signed)
Patient ID: [name], female   DOB: [birth year], 58 y.o.   MRN: [id number]     Cardiology Consult  Referring: Dr. 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 Who present for further evaluation of tachycardia.   She is s/p bilateral mastectomies, XRT, chemo. She is currently on Kadcyla (trastuzamab-emantsine) every 3 weeks. Most recently underwent skin flap for skim metastasis on her abdomen.   Denies any h/o heart problems. Notes mild DOE. Prior to skin flap was going to the gym and feeling much better. Since surgery HR has been close to 100 at rest. No CP. Mild LE edema.   Remains anemic. Recent Hgb 8.5. (previously was 12-13) Has not had thyroid checked. Has had PVCs with low potassium. No AF.      Review of Systems: [y] = yes, _0  = no   General: Weight gain _1 ; Weight loss _2 ; Anorexia _3 ; Fatigue _4 ; Fever _5 ; Chills _6 ; Weakness _7   Cardiac: Chest pain/pressure _8 ; Resting SOB _9 ; Exertional SOB _10 ; Orthopnea _11 ; Pedal Edema _12 ; Palpitations _13 ; Syncope _14 ; Presyncope _15 ; Paroxysmal nocturnal dyspnea_16   Pulmonary: Cough _17 ; Wheezing_18 ; Hemoptysis_19 ; Sputum _20 ; Snoring _21   GI: Vomiting_22 ; Dysphagia_23 ; Melena_24 ; Hematochezia _25 ; Heartburn_26 ; Abdominal pain _27 ; Constipation _28 ; Diarrhea _29 ; BRBPR _30   GU: Hematuria_31 ; Dysuria _32 ; Nocturia_33   Vascular: Pain in legs with walking _34 ; Pain in feet with lying flat _35 ; Non-healing sores _36 ; Stroke _37 ; TIA _38 ; Slurred speech _39 ;  Neuro: Headaches_40 ; Vertigo_41 ; Seizures_42 ; Paresthesias_43 ;Blurred vision _44 ; Diplopia _45 ; Vision changes _46   Ortho/Skin: Arthritis _47 ; Joint pain _48 ; Muscle pain _49 ; Joint swelling _50 ; Back Pain _51 ; Rash _52   Psych: Depression_53 ; Anxiety_54   Heme: Bleeding problems _55 ; Clotting disorders _56 ; Anemia _57   Endocrine: Diabetes _58 ; Thyroid dysfunction_59    Past Medical History    Diagnosis Date  . Elevated blood pressure reading without diagnosis of hypertension     PT MONITORS HER B/P AT HOME  . 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 Arthritis KNEES  . 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 Chronic anticoagulation HX  PORT-A-CATH CLOT    2010 - HAS BEEN ON LOVENOX SINCE THE CLOT  . Skin cancer of anterior chest BREAST CANCER PRIMARY W/ METS TO CHEST WALL SKIN CANCER--  CHEMOTHERAPY EVERY 3 WEEKS AT [facility]    Current Outpatient Prescriptions  Medication Sig Dispense Refill  . Ado-Trastuzumab Emtansine (KADCYLA IV) Inject into the vein. 2.61m/kg/  [facility]      . celecoxib (CELEBREX) 200 MG capsule Take 1 capsule (200 mg total) by mouth 2 (two) times daily.  60 capsule  5  . cetirizine (ZYRTEC) 10 MG tablet Take 10 mg by mouth daily as needed for allergies.       . Cholecalciferol (VITAMIN D3) 2000 UNITS TABS Take 1 tablet by mouth. TAKES ONE 5 DAYS A WEEK      .  darifenacin (ENABLEX) 7.5 MG 24 hr tablet Take 15 mg by mouth every evening.       . docusate sodium 100 MG CAPS Take 100 mg by mouth 2 (two) times daily.  10 capsule    . enoxaparin (LOVENOX) 80 MG/0.8ML injection Inject 0.7 mLs (70 mg total) into the skin daily.  0 Syringe    . gabapentin (NEURONTIN) 300 MG capsule Take 1 capsule (300 mg total) by mouth 3 (three) times daily.  90 capsule  5  . omeprazole (PRILOSEC) 20 MG capsule Take 20 mg by mouth daily as needed (acid reflux -RARELY TAKES).       . ondansetron (ZOFRAN) 8 MG tablet Take 1 tablet (8 mg total) by mouth every 8 (eight) hours as needed for nausea.      . traMADol (ULTRAM) 50 MG tablet TAKE 1 TABLET BY MOUTH EVERY 12 HOURS AS NEEDED FOR PAIN.  60 tablet  3  . zolpidem (AMBIEN) 5 MG tablet TAKE 1 TABLET BY MOUTH AT BEDTIME AS NEEDED  30 tablet  3  .  oxyCODONE-acetaminophen (PERCOCET) 5-325 MG per tablet Take 1-2 tablets by mouth every 4 (four) hours as needed for pain.  30 tablet  0   No current facility-administered medications for this encounter.    Allergies  Allergen Reactions  . Tape Hives    Can tolerate paper tape  . Penicillins Rash    Denies airway involvement    History   Social History  . Marital Status: Married    Spouse Name: N/A    Number of Children: 3  . Years of Education: N/A   Occupational History  . 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 Smoking status: Never Smoker   . Smokeless tobacco: Never Used  . Alcohol Use: No  . Drug Use: No  . Sexual Activity: Not on file   Other Topics Concern  . 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          Family History  Problem Relation Age of Onset  . Heart disease Mother     due to mitral valve regurgiation,   . Cancer Mother     breast  . Parkinsonism Father   . Stroke Sister   . Alcohol abuse Paternal Grandfather     PHYSICAL EXAM: Filed Vitals:   11/03/13 1454  BP: 108/60  Pulse: 102  Height: 5\' 7"  (1.702 m)  Weight: 172 lb 12.8 oz (78.382 kg)  SpO2: 99%    General:  Well appearing. No respiratory difficulty HEENT: normal Neck: supple. no JVD. Carotids 2+ bilat; no bruits. No lymphadenopathy or thryomegaly appreciated. Cor: PMI nondisplaced. Regular rate & rhythm. No rubs, gallops or murmurs. Lungs: clear Abdomen: soft, nontender, nondistended. No hepatosplenomegaly. No bruits or masses. Good bowel sounds. Extremities: no cyanosis, clubbing, rash, edema Neuro: alert & oriented x 3, cranial nerves grossly intact. moves all 4 extremities w/o difficulty. Affect pleasant.  ECG: NSR 90. LAD. No ST-T wave abnormalities.     ASSESSMENT & PLAN: 1. Sinus tachycardia 2.  Anemia 3. Breast cancer 4. Dyspnea on exertion   Suspect major issue in her tachycardia and DOE is her anemia. I have asked her to review this with her oncology team at [facility]  to see if she would be candidate for Aranesp. Would also check TFTs. We did discuss the possibility of PE but RV normal on echo and she is on lovenox so this is less likely. I have also reviewed her echos and cardiac status stable on Herceptin. Will enroll in cardio-oncology clinic.  Benay Spice 9:13 PM

## 2013-11-03 NOTE — Patient Instructions (Signed)
We will contact you in 4 months to schedule your next appointment and echocardiogram  

## 2013-11-10 ENCOUNTER — Encounter: Payer: Self-pay | Admitting: Internal Medicine

## 2013-11-10 ENCOUNTER — Ambulatory Visit (INDEPENDENT_AMBULATORY_CARE_PROVIDER_SITE_OTHER): Payer: 59 | Admitting: Internal Medicine

## 2013-11-10 VITALS — BP 102/70 | HR 109 | Temp 99.0°F | Resp 18 | Ht 67.0 in | Wt 172.0 lb

## 2013-11-10 DIAGNOSIS — N390 Urinary tract infection, site not specified: Secondary | ICD-10-CM

## 2013-11-10 DIAGNOSIS — R300 Dysuria: Secondary | ICD-10-CM

## 2013-11-10 DIAGNOSIS — R350 Frequency of micturition: Secondary | ICD-10-CM

## 2013-11-10 DIAGNOSIS — IMO0001 Reserved for inherently not codable concepts without codable children: Secondary | ICD-10-CM

## 2013-11-10 MED ORDER — CIPROFLOXACIN HCL 500 MG PO TABS: 500.0000 mg | ORAL_TABLET | Freq: Two times a day (BID) | ORAL | Status: DC

## 2013-11-10 NOTE — Progress Notes (Signed)
Subjective:    Patient ID: [name], female    DOB: [birth year], 58 y.o.   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  She also has a history of recurrent UTI and feels that she does have an infection at this time.  She is unable to give a urine specimen today and voided just prior to her office visit.  Past Medical History  Diagnosis Date  . Elevated blood pressure reading without diagnosis of hypertension     PT MONITORS HER B/P AT HOME  . 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 Arthritis KNEES  . 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 Chronic anticoagulation HX  PORT-A-CATH CLOT    2010 - HAS BEEN ON LOVENOX SINCE THE CLOT  . Skin cancer of anterior chest BREAST CANCER PRIMARY W/ METS TO CHEST WALL SKIN CANCER--  CHEMOTHERAPY EVERY 3 WEEKS AT [facility]    History   Social History  . Marital Status: Married    Spouse Name: N/A    Number of Children: 3  . Years of Education: N/A   Occupational History  . 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 Smoking status: Never Smoker   . Smokeless tobacco: Never Used  . Alcohol Use: No  . Drug Use: No  . Sexual Activity: Not on file   Other Topics Concern  . 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          Past Surgical History  Procedure Laterality Date  . Left modified radical mastectomy/ right total mastectomy  11-13-2007    LEFT BREAST CANCER W/ AXILLARY LYMPH NODE METASTASIS AND POST NEOADJUVANT CHEMO  . Placement port-a-cath  06-22-2007  . Transthoracic echocardiogram  11-18-2011  DR BENSIMHON (ECHO EVERY 3  MONTHS)    HX CHEMO INDUCED CARDIOTOXICITY/  LVSF NORMAL/ EF 55-50%/ MILD MITRIAL VALVE REGURG./ MILDLY INCREASED SYSTOLIC PRESSURE OF PULMONARY ARTERIES  . Knee arthroscopy  02/13/2012    Procedure: ARTHROSCOPY KNEE;  Surgeon: Johnn Hai, MD;  Location: [facility];  Service: Orthopedics;  Laterality: Left;  WITH DEBRIDEMENt   . Knee arthroscopy with lateral menisectomy  02/13/2012    Procedure: KNEE ARTHROSCOPY WITH LATERAL MENISECTOMY;  Surgeon: Johnn Hai, MD;  Location: [facility];  Service: Orthopedics;;  partial  . Total knee arthroplasty Left 09/23/2012    Procedure: LEFT TOTAL KNEE ARTHROPLASTY;  Surgeon: Johnn Hai, MD;  Location: [facility];  Service: Orthopedics;  Laterality: Left;  . Chest wall tumor excision    . Skin graft      chest-post tumor removal    Family History  Problem Relation Age of Onset  . Heart disease Mother     due to mitral valve regurgiation,   . Cancer Mother     breast  . Parkinsonism Father   . Stroke Sister   . Alcohol abuse Paternal Grandfather     Allergies  Allergen Reactions  . Tape Hives  Can tolerate paper tape  . Other Rash    STERI STRIPS - Blisters  . Penicillins Rash    Denies airway involvement    Current Outpatient Prescriptions on File Prior to Visit  Medication Sig Dispense Refill  . Ado-Trastuzumab Emtansine (KADCYLA IV) Inject into the vein. 2.20m/kg/  [facility]      . celecoxib (CELEBREX) 200 MG capsule Take 1 capsule (200 mg total) by mouth 2 (two) times daily.  60 capsule  5  . cetirizine (ZYRTEC) 10 MG tablet Take 10 mg by mouth daily as needed for allergies.       . Cholecalciferol (VITAMIN D3) 2000 UNITS TABS Take 1 tablet by mouth. TAKES ONE 5 DAYS A WEEK      . darifenacin (ENABLEX) 7.5 MG 24 hr tablet Take 15 mg by mouth every evening.       . docusate sodium 100 MG CAPS Take 100 mg by mouth 2 (two) times daily.  10 capsule    .  enoxaparin (LOVENOX) 80 MG/0.8ML injection Inject 0.7 mLs (70 mg total) into the skin daily.  0 Syringe    . gabapentin (NEURONTIN) 300 MG capsule Take 1 capsule (300 mg total) by mouth 3 (three) times daily.  90 capsule  5  . omeprazole (PRILOSEC) 20 MG capsule Take 20 mg by mouth daily as needed (acid reflux -RARELY TAKES).       . ondansetron (ZOFRAN) 8 MG tablet Take 1 tablet (8 mg total) by mouth every 8 (eight) hours as needed for nausea.      . traMADol (ULTRAM) 50 MG tablet TAKE 1 TABLET BY MOUTH EVERY 12 HOURS AS NEEDED FOR PAIN.  60 tablet  3  . zolpidem (AMBIEN) 5 MG tablet TAKE 1 TABLET BY MOUTH AT BEDTIME AS NEEDED  30 tablet  3   No current facility-administered medications on file prior to visit.    BP 102/70  Pulse 109  Temp(Src) 99 F (37.2 C) (Oral)  Resp 18  Ht 5' 7"  (1.702 m)  Wt 172 lb (78.019 kg)  BMI 26.93 kg/m2  SpO2 96%  LMP 06/22/2007      Review of Systems  Constitutional: Negative for fever.  Genitourinary: Positive for dysuria, urgency and frequency. Negative for flank pain.       Objective:   Physical Exam  Constitutional: She appears well-developed and well-nourished. No distress.          Assessment & Plan:   Urinary frequency and dysuria in a patient with prior UTI, and similar symptoms.  Will empirically treat with 3 days of Cipro Chronic anticoagulation with Lovenox History of left breast cancer treated with chemotherapy

## 2013-11-10 NOTE — Patient Instructions (Signed)
Drink as much fluid as you  can tolerate over the next few days  Take your antibiotic as prescribed until ALL of it is gone, but stop if you develop a rash, swelling, or any side effects of the medication.  Contact our office as soon as possible if  there are side effects of the medication.  Call or return to clinic prn if these symptoms worsen or fail to improve as anticipated.   

## 2013-11-10 NOTE — Progress Notes (Signed)
   Subjective:    Patient ID: [name], female    DOB: November 26, 1955, 58 y.o.   MRN: [id number]  HPI    Review of Systems     Objective:   Physical Exam        Assessment & Plan:

## 2013-11-12 ENCOUNTER — Encounter: Payer: Self-pay | Admitting: Internal Medicine

## 2013-11-14 ENCOUNTER — Other Ambulatory Visit: Payer: Self-pay | Admitting: Internal Medicine

## 2013-12-02 ENCOUNTER — Ambulatory Visit (INDEPENDENT_AMBULATORY_CARE_PROVIDER_SITE_OTHER): Payer: 59 | Admitting: Physician Assistant

## 2013-12-02 ENCOUNTER — Encounter: Payer: Self-pay | Admitting: Internal Medicine

## 2013-12-02 ENCOUNTER — Encounter: Payer: Self-pay | Admitting: Physician Assistant

## 2013-12-02 ENCOUNTER — Other Ambulatory Visit: Payer: Self-pay | Admitting: Internal Medicine

## 2013-12-02 VITALS — BP 108/72 | HR 97 | Temp 99.1°F | Resp 18 | Wt 171.0 lb

## 2013-12-02 DIAGNOSIS — J0100 Acute maxillary sinusitis, unspecified: Secondary | ICD-10-CM

## 2013-12-02 MED ORDER — CEFUROXIME AXETIL 500 MG PO TABS: 500.0000 mg | ORAL_TABLET | Freq: Two times a day (BID) | ORAL | Status: DC

## 2013-12-02 NOTE — Patient Instructions (Addendum)
Ceftin twice daily for 10 days for sinus infection.   Plain Over the Counter Mucinex (NOT Mucinex D) for thick secretions  Force NON dairy fluids, drinking plenty of water is best.    Over the Counter Flonase OR Nasacort AQ 1 spray in each nostril twice a day as needed. Use the "crossover" technique into opposite nostril spraying toward opposite ear @ 45 degree angle, not straight up into nostril.   Plain Over the Counter Allegra (NOT D )  160 daily , OR Loratidine 10 mg , OR Zyrtec 10 mg @ bedtime  as needed for itchy eyes & sneezing.  Saline Irrigation and Saline Sprays can also help reduce symptoms.  If emergency symptoms discussed during visit developed, seek medical attention immediately.  Followup as needed, or for worsening or persistent symptoms despite treatment.   Sinusitis Sinusitis is redness, soreness, and puffiness (inflammation) of the air pockets in the bones of your face (sinuses). The redness, soreness, and puffiness can cause air and mucus to get trapped in your sinuses. This can allow germs to grow and cause an infection.  HOME CARE   Drink enough fluids to keep your pee (urine) clear or pale yellow.  Use a humidifier in your home.  Run a hot shower to create steam in the bathroom. Sit in the bathroom with the door closed. Breathe in the steam 3-4 times a day.  Put a warm, moist washcloth on your face 3-4 times a day, or as told by your doctor.  Use salt water sprays (saline sprays) to wet the thick fluid in your nose. This can help the sinuses drain.  Only take medicine as told by your doctor. GET HELP RIGHT AWAY IF:   Your pain gets worse.  You have very bad headaches.  You are sick to your stomach (nauseous).  You throw up (vomit).  You are very sleepy (drowsy) all the time.  Your face is puffy (swollen).  Your vision changes.  You have a stiff neck.  You have trouble breathing. MAKE SURE YOU:   Understand these instructions.  Will watch  your condition.  Will get help right away if you are not doing well or get worse. Document Released: 10/29/2007 Document Revised: 02/04/2012 Document Reviewed: 12/16/2011 [facility] Patient Information 2015 [place], [place]. This information is not intended to replace advice given to you by your health care provider. Make sure you discuss any questions you have with your health care provider.

## 2013-12-02 NOTE — Progress Notes (Signed)
Subjective:    Patient ID: [name], female    DOB: 11-12-1955, 58 y.o.   MRN: [id number]  Sinusitis This is a new problem. The current episode started 1 to 4 weeks ago (about 2 weeks). The problem has been gradually worsening since onset. The maximum temperature recorded prior to her arrival was 100 - 100.9 F. The fever has been present for 1 to 2 days. Her pain is at a severity of 5/10. The pain is moderate. Associated symptoms include chills, congestion, coughing, ear pain (right ear) and sinus pressure. Pertinent negatives include no diaphoresis, headaches, hoarse voice, neck pain, shortness of breath, sneezing, sore throat or swollen glands. Past treatments include acetaminophen. The treatment provided mild (relieved fever) relief.      Review of Systems  Constitutional: Positive for chills. Negative for diaphoresis.  HENT: Positive for congestion, ear pain (right ear) and sinus pressure. Negative for hoarse voice, sneezing and sore throat.   Respiratory: Positive for cough. Negative for shortness of breath.   Cardiovascular: Negative for chest pain.  Gastrointestinal: Negative for nausea, vomiting and diarrhea.  Musculoskeletal: Negative for neck pain.  Neurological: Negative for headaches.  All other systems reviewed and are negative.     Past Medical History  Diagnosis Date  . Elevated blood pressure reading without diagnosis of hypertension     PT MONITORS HER B/P AT HOME  . 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 Arthritis KNEES  . 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 Chronic anticoagulation HX  PORT-A-CATH CLOT    2010 - HAS BEEN ON LOVENOX SINCE THE CLOT  . Skin cancer of anterior chest BREAST CANCER PRIMARY W/ METS TO CHEST WALL SKIN CANCER--  CHEMOTHERAPY EVERY 3 WEEKS AT [facility]    History   Social History  . Marital  Status: Married    Spouse Name: N/A    Number of Children: 3  . Years of Education: N/A   Occupational History  . 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 Smoking status: Never Smoker   . Smokeless tobacco: Never Used  . Alcohol Use: No  . Drug Use: No  . Sexual Activity: Not on file   Other Topics Concern  . 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          Past Surgical History  Procedure Laterality Date  . Left modified radical mastectomy/ right total mastectomy  11-13-2007    LEFT BREAST CANCER W/ AXILLARY LYMPH NODE METASTASIS AND POST NEOADJUVANT CHEMO  . Placement port-a-cath  06-22-2007  . Transthoracic echocardiogram  11-18-2011  DR BENSIMHON (ECHO EVERY 3 MONTHS)    HX CHEMO INDUCED CARDIOTOXICITY/  LVSF NORMAL/ EF 55-50%/ MILD MITRIAL VALVE REGURG./ MILDLY INCREASED SYSTOLIC PRESSURE OF PULMONARY ARTERIES  . Knee arthroscopy  02/13/2012    Procedure: ARTHROSCOPY KNEE;  Surgeon: Johnn Hai, MD;  Location: [facility];  Service: Orthopedics;  Laterality: Left;  WITH DEBRIDEMENt   . Knee arthroscopy with lateral menisectomy  02/13/2012    Procedure: KNEE ARTHROSCOPY WITH LATERAL MENISECTOMY;  Surgeon: Doroteo Bradford  Tonita Cong, MD;  Location: [place];  Service: Orthopedics;;  partial  . Total knee arthroplasty Left 09/23/2012    Procedure: LEFT TOTAL KNEE ARTHROPLASTY;  Surgeon: Johnn Hai, MD;  Location: [facility];  Service: Orthopedics;  Laterality: Left;  . Chest wall tumor excision    . Skin graft      chest-post tumor removal    Family History  Problem Relation Age of Onset  . Heart disease Mother     due to mitral valve regurgiation,   . Cancer Mother     breast  . Parkinsonism Father   . Stroke Sister   . Alcohol abuse Paternal Grandfather     Allergies  Allergen  Reactions  . Tape Hives    Can tolerate paper tape  . Other Rash    STERI STRIPS - Blisters  . Penicillins Rash    Denies airway involvement    Current Outpatient Prescriptions on File Prior to Visit  Medication Sig Dispense Refill  . Ado-Trastuzumab Emtansine (KADCYLA IV) Inject into the vein. 2.$RemoveBef'5mg'IUJtvZfdtG$ /kg/  CHEMOTHERAPY DRUG GIVEN EVERY 3 WEEKS  AT [facility]      . celecoxib (CELEBREX) 200 MG capsule Take 1 capsule (200 mg total) by mouth 2 (two) times daily.  60 capsule  5  . cetirizine (ZYRTEC) 10 MG tablet Take 10 mg by mouth daily as needed for allergies.       . Cholecalciferol (VITAMIN D3) 2000 UNITS TABS Take 1 tablet by mouth. TAKES ONE 5 DAYS A WEEK      . ciprofloxacin (CIPRO) 500 MG tablet Take 1 tablet (500 mg total) by mouth 2 (two) times daily.  6 tablet  0  . darifenacin (ENABLEX) 7.5 MG 24 hr tablet Take 15 mg by mouth every evening.       . docusate sodium 100 MG CAPS Take 100 mg by mouth 2 (two) times daily.  10 capsule    . enoxaparin (LOVENOX) 80 MG/0.8ML injection Inject 0.7 mLs (70 mg total) into the skin daily.  0 Syringe    . gabapentin (NEURONTIN) 300 MG capsule Take 1 capsule (300 mg total) by mouth 3 (three) times daily.  90 capsule  5  . omeprazole (PRILOSEC) 20 MG capsule Take 20 mg by mouth daily as needed (acid reflux -RARELY TAKES).       . ondansetron (ZOFRAN) 8 MG tablet Take 1 tablet (8 mg total) by mouth every 8 (eight) hours as needed for nausea.      . traMADol (ULTRAM) 50 MG tablet TAKE 1 TABLET BY MOUTH EVERY 12 HOURS AS NEEDED FOR PAIN  60 tablet  2  . zolpidem (AMBIEN) 5 MG tablet TAKE 1 TABLET BY MOUTH AT BEDTIME AS NEEDED  30 tablet  3   No current facility-administered medications on file prior to visit.    EXAM: BP 108/72  Pulse 97  Temp(Src) 99.1 F (37.3 C) (Oral)  Resp 18  Wt 171 lb (77.565 kg)  SpO2 97%  LMP 06/22/2007     Objective:   Physical Exam  Nursing note and vitals reviewed. Constitutional: She is oriented to  person, place, and time. She appears well-developed and well-nourished. No distress.  HENT:  Head: Normocephalic and atraumatic.  Right Ear: External ear normal.  Left Ear: External ear normal.  Nose: Nose normal.  Mouth/Throat: No oropharyngeal exudate.  Oropharynx is slightly erythematous, no exudate. Bilateral TMs normal. Bilateral frontal sinuses non-TTP. Bilat max sinus TTP.  Eyes: Conjunctivae and EOM  are normal. Pupils are equal, round, and reactive to light.  Neck: Normal range of motion. Neck supple. No JVD present.  Cardiovascular: Normal rate, regular rhythm and intact distal pulses.   Pulmonary/Chest: Effort normal and breath sounds normal. No stridor. No respiratory distress. She exhibits no tenderness.  Musculoskeletal: Normal range of motion.  Lymphadenopathy:    She has no cervical adenopathy.  Neurological: She is alert and oriented to person, place, and time.  Skin: Skin is warm and dry. No rash noted. She is not diaphoretic. No erythema. No pallor.  Psychiatric: She has a normal mood and affect. Her behavior is normal. Judgment and thought content normal.    Lab Results  Component Value Date   WBC 6.7 09/26/2012   HGB 7.5* 09/26/2012   HCT 22.8* 09/26/2012   PLT 125* 09/26/2012   GLUCOSE 107* 09/24/2012   ALT 34 10/23/2011   AST 40* 10/23/2011   NA 129* 09/24/2012   K 3.2* 09/24/2012   CL 93* 09/24/2012   CREATININE 0.57 09/24/2012   BUN 6 09/24/2012   CO2 30 09/24/2012   TSH 1.583 10/23/2011   INR 1.00 09/15/2012         Assessment & Plan:  Oluwadara was seen today for sinusitis.  Diagnoses and associated orders for this visit:  Acute maxillary sinusitis, recurrence not specified Comments: 2 weeks, max sinus TTP, fever, pt PCN allergic, has tolerated cephs in the past, will treat with ceftin. - cefUROXime (CEFTIN) 500 MG tablet; Take 1 tablet (500 mg total) by mouth 2 (two) times daily.    Pt PCN allergic, rash as an adult, tolerated Cefazolin in the past during a  surgery, will treat with Ceftin.  Also educated on OTC options to boost recovery, nasal steroids, mucinex, push fluids.  Return precautions provided, and patient handout on sinusitis.  Plan to follow up as needed, or for worsening or persistent symptoms despite treatment.  Patient Instructions  Ceftin twice daily for 10 days for sinus infection.   Plain Over the Counter Mucinex (NOT Mucinex D) for thick secretions  Force NON dairy fluids, drinking plenty of water is best.    Over the Counter Flonase OR Nasacort AQ 1 spray in each nostril twice a day as needed. Use the "crossover" technique into opposite nostril spraying toward opposite ear @ 45 degree angle, not straight up into nostril.   Plain Over the Counter Allegra (NOT D )  160 daily , OR Loratidine 10 mg , OR Zyrtec 10 mg @ bedtime  as needed for itchy eyes & sneezing.  Saline Irrigation and Saline Sprays can also help reduce symptoms.  If emergency symptoms discussed during visit developed, seek medical attention immediately.  Followup as needed, or for worsening or persistent symptoms despite treatment.

## 2013-12-02 NOTE — Progress Notes (Signed)
Pre visit review using our clinic review tool, if applicable. No additional management support is needed unless otherwise documented below in the visit note. 

## 2013-12-05 MED ORDER — GABAPENTIN 300 MG PO CAPS: 300.0000 mg | ORAL_CAPSULE | Freq: Three times a day (TID) | ORAL | Status: DC

## 2013-12-05 MED ORDER — ZOLPIDEM TARTRATE 5 MG PO TABS: ORAL_TABLET | ORAL | Status: DC

## 2014-01-05 ENCOUNTER — Encounter: Payer: Self-pay | Admitting: Internal Medicine

## 2014-01-06 MED ORDER — GABAPENTIN 300 MG PO CAPS: 300.0000 mg | ORAL_CAPSULE | Freq: Three times a day (TID) | ORAL | Status: DC

## 2014-01-09 ENCOUNTER — Other Ambulatory Visit (HOSPITAL_COMMUNITY): Payer: Self-pay | Admitting: Anesthesiology

## 2014-01-09 DIAGNOSIS — C50912 Malignant neoplasm of unspecified site of left female breast: Secondary | ICD-10-CM

## 2014-01-11 ENCOUNTER — Encounter (HOSPITAL_COMMUNITY): Payer: Self-pay | Admitting: Emergency Medicine

## 2014-01-11 ENCOUNTER — Emergency Department (HOSPITAL_COMMUNITY)
Admission: EM | Admit: 2014-01-11 | Discharge: 2014-01-11 | Disposition: A | Discharge status: Home / Self Care | Payer: 59 | Attending: Emergency Medicine | Admitting: Emergency Medicine

## 2014-01-11 ENCOUNTER — Emergency Department (HOSPITAL_COMMUNITY): Payer: 59

## 2014-01-11 DIAGNOSIS — N390 Urinary tract infection, site not specified: Secondary | ICD-10-CM | POA: Diagnosis not present

## 2014-01-11 DIAGNOSIS — Z7901 Long term (current) use of anticoagulants: Secondary | ICD-10-CM | POA: Diagnosis not present

## 2014-01-11 DIAGNOSIS — M129 Arthropathy, unspecified: Secondary | ICD-10-CM | POA: Insufficient documentation

## 2014-01-11 DIAGNOSIS — Z79899 Other long term (current) drug therapy: Secondary | ICD-10-CM | POA: Insufficient documentation

## 2014-01-11 DIAGNOSIS — Z880 Allergy status to penicillin: Secondary | ICD-10-CM | POA: Diagnosis not present

## 2014-01-11 DIAGNOSIS — Z85828 Personal history of other malignant neoplasm of skin: Secondary | ICD-10-CM | POA: Diagnosis not present

## 2014-01-11 DIAGNOSIS — R509 Fever, unspecified: Secondary | ICD-10-CM | POA: Insufficient documentation

## 2014-01-11 DIAGNOSIS — C7981 Secondary malignant neoplasm of breast: Secondary | ICD-10-CM | POA: Insufficient documentation

## 2014-01-11 LAB — URINE MICROSCOPIC-ADD ON

## 2014-01-11 LAB — COMPREHENSIVE METABOLIC PANEL
ALK PHOS: 128 U/L — AB (ref 39–117)
ALT: 62 U/L — ABNORMAL HIGH (ref 0–35)
ANION GAP: 14 (ref 5–15)
AST: 107 U/L — ABNORMAL HIGH (ref 0–37)
Albumin: 3.6 g/dL (ref 3.5–5.2)
BILIRUBIN TOTAL: 0.9 mg/dL (ref 0.3–1.2)
BUN: 11 mg/dL (ref 6–23)
CHLORIDE: 93 meq/L — AB (ref 96–112)
CO2: 25 mEq/L (ref 19–32)
CREATININE: 0.59 mg/dL (ref 0.50–1.10)
Calcium: 9.5 mg/dL (ref 8.4–10.5)
GLUCOSE: 94 mg/dL (ref 70–99)
POTASSIUM: 3.9 meq/L (ref 3.7–5.3)
Sodium: 132 mEq/L — ABNORMAL LOW (ref 137–147)
Total Protein: 8 g/dL (ref 6.0–8.3)

## 2014-01-11 LAB — CBC WITH DIFFERENTIAL/PLATELET
Basophils Absolute: 0 10*3/uL (ref 0.0–0.1)
Basophils Relative: 1 % (ref 0–1)
Eosinophils Absolute: 0.1 10*3/uL (ref 0.0–0.7)
Eosinophils Relative: 2 % (ref 0–5)
HEMATOCRIT: 31 % — AB (ref 36.0–46.0)
HEMOGLOBIN: 10 g/dL — AB (ref 12.0–15.0)
LYMPHS ABS: 0.7 10*3/uL (ref 0.7–4.0)
Lymphocytes Relative: 18 % (ref 12–46)
MCH: 26.4 pg (ref 26.0–34.0)
MCHC: 32.3 g/dL (ref 30.0–36.0)
MCV: 81.8 fL (ref 78.0–100.0)
MONO ABS: 0.4 10*3/uL (ref 0.1–1.0)
MONOS PCT: 9 % (ref 3–12)
NEUTROS ABS: 2.8 10*3/uL (ref 1.7–7.7)
NEUTROS PCT: 70 % (ref 43–77)
Platelets: 199 10*3/uL (ref 150–400)
RBC: 3.79 MIL/uL — AB (ref 3.87–5.11)
RDW: 20.1 % — ABNORMAL HIGH (ref 11.5–15.5)
WBC: 4 10*3/uL (ref 4.0–10.5)

## 2014-01-11 LAB — URINALYSIS, ROUTINE W REFLEX MICROSCOPIC
BILIRUBIN URINE: NEGATIVE
Glucose, UA: NEGATIVE mg/dL
Hgb urine dipstick: NEGATIVE
KETONES UR: NEGATIVE mg/dL
NITRITE: POSITIVE — AB
PROTEIN: NEGATIVE mg/dL
Specific Gravity, Urine: 1.017 (ref 1.005–1.030)
UROBILINOGEN UA: 1 mg/dL (ref 0.0–1.0)
pH: 5.5 (ref 5.0–8.0)

## 2014-01-11 LAB — I-STAT CG4 LACTIC ACID, ED: LACTIC ACID, VENOUS: 0.78 mmol/L (ref 0.5–2.2)

## 2014-01-11 MED ORDER — HEPARIN SOD (PORK) LOCK FLUSH 100 UNIT/ML IV SOLN
500.0000 [IU] | Freq: Once | INTRAVENOUS | Status: AC
  Administered 2014-01-11: 500 [IU]
  Filled 2014-01-11: qty 5

## 2014-01-11 MED ORDER — SODIUM CHLORIDE 0.9 % IV BOLUS (SEPSIS)
500.0000 mL | Freq: Once | INTRAVENOUS | Status: AC
  Administered 2014-01-11: 500 mL via INTRAVENOUS

## 2014-01-11 MED ORDER — CEPHALEXIN 500 MG PO CAPS: 500.0000 mg | ORAL_CAPSULE | Freq: Three times a day (TID) | ORAL | Status: DC

## 2014-01-11 MED ORDER — DEXTROSE 5 % IV SOLN
1.0000 g | Freq: Once | INTRAVENOUS | Status: AC
  Administered 2014-01-11: 1 g via INTRAVENOUS
  Filled 2014-01-11: qty 10

## 2014-01-11 NOTE — Discharge Instructions (Signed)

## 2014-01-11 NOTE — ED Provider Notes (Signed)
CSN: [id number]     Arrival date & time 01/11/14  1443 History   First MD Initiated Contact with Patient 01/11/14 1502     Chief Complaint  Patient presents with  . ca pt, fever      (Consider location/radiation/quality/duration/timing/severity/associated sxs/prior Treatment) Patient is a 58 y.o. female presenting with fever.  Fever Max temp prior to arrival:  100.8 Temp source:  Oral Severity:  Moderate Onset quality:  Gradual Duration:  2 days Timing:  Intermittent Progression:  Unchanged Chronicity:  New Relieved by:  Ibuprofen Worsened by:  Nothing tried Associated symptoms: chills and myalgias   Associated symptoms: no chest pain, no cough, no diarrhea, no dysuria, no headaches, no nausea, no rash and no vomiting   Risk factors: hx of cancer     Past Medical History  Diagnosis Date  . Elevated blood pressure reading without diagnosis of hypertension     PT MONITORS HER B/P AT HOME  . 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 Arthritis KNEES  . 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 Chronic anticoagulation HX  PORT-A-CATH CLOT    2010 - HAS BEEN ON LOVENOX SINCE THE CLOT  . Skin cancer of anterior chest BREAST CANCER PRIMARY W/ METS TO CHEST WALL SKIN CANCER--  CHEMOTHERAPY EVERY 3 WEEKS AT [facility]   Past Surgical History  Procedure Laterality Date  . Left modified radical mastectomy/ right total mastectomy  11-13-2007    LEFT BREAST CANCER W/ AXILLARY LYMPH NODE METASTASIS AND POST NEOADJUVANT CHEMO  . Placement port-a-cath  06-22-2007  . Transthoracic echocardiogram  11-18-2011  DR BENSIMHON (ECHO EVERY 3 MONTHS)    HX CHEMO INDUCED CARDIOTOXICITY/  LVSF NORMAL/ EF 55-50%/ MILD MITRIAL VALVE REGURG./ MILDLY INCREASED SYSTOLIC PRESSURE OF PULMONARY ARTERIES  . Knee arthroscopy  02/13/2012    Procedure: ARTHROSCOPY KNEE;  Surgeon:  Johnn Hai, MD;  Location: [facility];  Service: Orthopedics;  Laterality: Left;  WITH DEBRIDEMENt   . Knee arthroscopy with lateral menisectomy  02/13/2012    Procedure: KNEE ARTHROSCOPY WITH LATERAL MENISECTOMY;  Surgeon: Johnn Hai, MD;  Location[facility];  Service: Orthopedics;;  partial  . Total knee arthroplasty Left 09/23/2012    Procedure: LEFT TOTAL KNEE ARTHROPLASTY;  Surgeon: Johnn Hai, MD;  Location: [facility];  Service: Orthopedics;  Laterality: Left;  . Chest wall tumor excision    . Skin graft      chest-post tumor removal   Family History  Problem Relation Age of Onset  . Heart disease Mother     due to mitral valve regurgiation,   . Cancer Mother     breast  . Parkinsonism Father   . Stroke Sister   . Alcohol abuse Paternal Grandfather    History  Substance Use Topics  . Smoking status: Never Smoker   . Smokeless tobacco: Never Used  . Alcohol Use: No   OB History   Grav Para Term Preterm Abortions TAB SAB Ect Mult Living                 Review of Systems  Constitutional: Positive for fever and chills.  Respiratory: Negative for cough.   Cardiovascular: Negative for chest pain.  Gastrointestinal: Negative for nausea, vomiting and diarrhea.  Genitourinary: Negative for dysuria.  Musculoskeletal: Positive for myalgias.  Skin: Negative for rash.  Neurological: Negative for headaches.  All other systems reviewed and are negative.     Allergies  Tape; Other; and Penicillins  Home Medications   Prior to Admission medications   Medication Sig Start Date End Date Taking? Authorizing Provider  Ado-Trastuzumab Emtansine (KADCYLA IV) Inject into the vein. 2.28m/kg/ [place]    Historical Provider, MD  cefUROXime (CEFTIN) 500 MG tablet Take 1 tablet (500 mg total) by mouth 2 (two) times daily. 12/02/13   MZenaida Niece PA-C  celecoxib (CELEBREX) 200 MG capsule Take 1  capsule (200 mg total) by mouth 2 (two) times daily. 05/31/13   Doe-Hyun R YShawna Orleans DO  cetirizine (ZYRTEC) 10 MG tablet Take 10 mg by mouth daily as needed for allergies.     Historical Provider, MD  Cholecalciferol (VITAMIN D3) 2000 UNITS TABS Take 1 tablet by mouth. Takes one tab 5 days out of the week.    Historical Provider, MD  ciprofloxacin (CIPRO) 500 MG tablet Take 1 tablet (500 mg total) by mouth 2 (two) times daily. 11/10/13   PMarletta Lor MD  darifenacin (ENABLEX) 7.5 MG 24 hr tablet Take 15 mg by mouth every evening.     Historical Provider, MD  docusate sodium 100 MG CAPS Take 100 mg by mouth 2 (two) times daily. 09/26/12   MLucille PassyBabish, PA-C  enoxaparin (LOVENOX) 80 MG/0.8ML injection Inject 0.7 mLs (70 mg total) into the skin daily. 09/26/12   MLucille PassyBabish, PA-C  gabapentin (NEURONTIN) 300 MG capsule Take 1 capsule (300 mg total) by mouth 3 (three) times daily. 01/06/14   Doe-Hyun R YShawna Orleans DO  omeprazole (PRILOSEC) 20 MG capsule Take 20 mg by mouth daily as needed (for acid reflux-- rarely takes).     Historical Provider, MD  ondansetron (ZOFRAN) 8 MG tablet Take 1 tablet (8 mg total) by mouth every 8 (eight) hours as needed for nausea. 06/19/11   WZigmund Gottron MD  traMADol (ULTRAM) 50 MG tablet Take 50 mg by mouth every 12 (twelve) hours as needed for moderate pain.    Historical Provider, MD  zolpidem (AMBIEN) 5 MG tablet Take 5 mg by mouth at bedtime as needed for sleep.    Historical Provider, MD   BP 115/65  Pulse 114  Temp(Src) 99.4 F (37.4 C) (Oral)  Resp 18  SpO2 95%  LMP 06/22/2007 Physical Exam  Nursing note and vitals reviewed. Constitutional: She is oriented to person, place, and time. She appears well-developed and well-nourished. No distress.  HENT:  Head: Normocephalic and atraumatic.  Mouth/Throat: Oropharynx is clear and moist.  Eyes: Conjunctivae are normal. Pupils are equal, round, and reactive to light. No scleral icterus.  Neck: Neck  supple.  Cardiovascular: Regular rhythm, normal heart sounds and intact distal pulses.  Tachycardia present.   No murmur heard. Pulmonary/Chest: Effort normal and breath sounds normal. No stridor. No respiratory distress. She has no rales.  Abdominal: Soft. Bowel sounds are normal. She exhibits no distension. There is no tenderness.  Musculoskeletal: Normal range of motion.  Neurological: She is alert and oriented to person, place, and time.  Skin: Skin is warm and dry. No rash noted.  Psychiatric: She has a normal mood and affect. Her behavior is normal.    ED Course  Procedures (including critical care time) Labs Review Labs Reviewed  CBC WITH DIFFERENTIAL - Abnormal; Notable for the following:    RBC 3.79 (*)  Hemoglobin 10.0 (*)    HCT 31.0 (*)    RDW 20.1 (*)    All other components within normal limits  COMPREHENSIVE METABOLIC PANEL - Abnormal; Notable for the following:    Sodium 132 (*)    Chloride 93 (*)    AST 107 (*)    ALT 62 (*)    Alkaline Phosphatase 128 (*)    All other components within normal limits  URINALYSIS, ROUTINE W REFLEX MICROSCOPIC - Abnormal; Notable for the following:    APPearance CLOUDY (*)    Nitrite POSITIVE (*)    Leukocytes, UA MODERATE (*)    All other components within normal limits  URINE MICROSCOPIC-ADD ON - Abnormal; Notable for the following:    Bacteria, UA MANY (*)    All other components within normal limits  URINE CULTURE  I-STAT CG4 LACTIC ACID, ED    Imaging Review Dg Chest Port 1 View  01/11/2014   CLINICAL DATA:  Fever.  EXAM: PORTABLE CHEST - 1 VIEW  COMPARISON:  10/23/2011.  FINDINGS: Surgical clips are noted over the chest. Left mastectomy. Power port catheter in good anatomic position. Mediastinum and hilar structures are normal. Lungs are clear. No pleural effusion or pneumothorax. No acute bony abnormality. Degenerative changes thoracic spine.  IMPRESSION: 1. Power port catheter in good anatomic position. 2.  Postsurgical changes noted about the chest bilaterally. Left mastectomy. 3. No acute cardiopulmonary disease.   Electronically Signed   By: Marcello Moores  Register   On: 01/11/2014 15:37  All radiology studies independently viewed by me.      EKG Interpretation None      MDM   Final diagnoses:  Fever, unspecified fever cause  UTI (lower urinary tract infection)    58 yo female with hx of metastatic breast cancer on chemo presenting with fevers two days after last chemo treatment.  Gets oncologic care at [facility] by Dr. Gypsy Balsam.  She denies any infectious symptoms other than fever, chills, myalgias.  Well appearing, nontoxic, but a little tachycardic.  Infectious workup initiated.    Workup shows nitrite positive UTI. Given dose of ceftriaxone.  She remains well appearing.  Not neutropenic.  I think she is stable for outpatient management and pt is in agreement with plan.  DC with keflex.   Houston Siren III, MD 01/11/14 [phone]

## 2014-01-11 NOTE — ED Notes (Signed)
Pt reports met breast cancer, restricted left extremity. Pt got chemo on Monday, since then has had a fever. Pt took tylenol yesterday for 100.6 F orally, today pt took 432m ibuprofen. Denies pain.

## 2014-01-12 ENCOUNTER — Ambulatory Visit (INDEPENDENT_AMBULATORY_CARE_PROVIDER_SITE_OTHER): Payer: 59 | Admitting: Internal Medicine

## 2014-01-12 ENCOUNTER — Encounter: Payer: Self-pay | Admitting: Internal Medicine

## 2014-01-12 VITALS — BP 104/62 | HR 88 | Temp 99.8°F | Ht 67.0 in | Wt 168.0 lb

## 2014-01-12 DIAGNOSIS — IMO0002 Reserved for concepts with insufficient information to code with codable children: Secondary | ICD-10-CM

## 2014-01-12 DIAGNOSIS — R0609 Other forms of dyspnea: Secondary | ICD-10-CM

## 2014-01-12 DIAGNOSIS — N390 Urinary tract infection, site not specified: Secondary | ICD-10-CM

## 2014-01-12 DIAGNOSIS — R0989 Other specified symptoms and signs involving the circulatory and respiratory systems: Secondary | ICD-10-CM

## 2014-01-12 DIAGNOSIS — R0600 Dyspnea, unspecified: Secondary | ICD-10-CM

## 2014-01-12 DIAGNOSIS — M1710 Unilateral primary osteoarthritis, unspecified knee: Secondary | ICD-10-CM

## 2014-01-12 MED ORDER — MIRABEGRON ER 50 MG PO TB24: 50.0000 mg | ORAL_TABLET | Freq: Every day | ORAL | Status: DC

## 2014-01-12 MED ORDER — CIPROFLOXACIN HCL 500 MG PO TABS: 500.0000 mg | ORAL_TABLET | Freq: Two times a day (BID) | ORAL | Status: DC

## 2014-01-12 NOTE — Assessment & Plan Note (Addendum)
58 year old white female with history of breast cancer with frequent UTIs. She has had symptoms of overactive bladder/bladder irritation with her previous history of chemotherapy. She also has intermittent urinary stress incontinence.  Discontinue Enablex as it is causing dry mouth and constipation. Replace with Myrmetriq 50 mg once daily.  Rx for Cipro 500 mg provided.  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  Reassess in 2 months.

## 2014-01-12 NOTE — Assessment & Plan Note (Signed)
DOE likely multifactorial.  She has anemia and diastolic dysfunction.

## 2014-01-12 NOTE — Progress Notes (Signed)
Pre visit review using our clinic review tool, if applicable. No additional management support is needed unless otherwise documented below in the visit note. 

## 2014-01-12 NOTE — Assessment & Plan Note (Signed)
Discontinue celebrex.  Use tramadol 50 mg once daily as needed.

## 2014-01-12 NOTE — Progress Notes (Signed)
Subjective:    Patient ID: [name], female    DOB: 14-Mar-1956, 58 y.o.   MRN: [id number]  HPI  58 year old white female with history of breast cancer, chronic anticoagulation, and frequent UTIs for ER follow up.   Patient seen yesterday at [facility] long ER secondary to fever of 101.0. Patient found to have urinary tract infection. She was given bolus of Rocephin and started on Keflex 500 mg 3 times a day. Patient feeling better. She has low-grade fever today.  Patient reports frequent UTIs after getting chemotherapy. She has chronic urinary urgency. She has been taking Enablex once daily but it causes constipation and dry mouth.  Chronic dyspnea-she has anemia with hemoglobin near 10. She is taking iron supplements daily.  Medical records reviewed. Patient completed 2-D echocardiogram on 10/07/2013. Echo showed EF of 60-65% but grade 2 diastolic dysfunction.  Chronic intermittent left knee pain-she had total left knee. She has intermittent pains. She is followed by Dr. Maxie Better. She was taking Celebrex 200 mg once daily but discontinued. She takes tramadol 50 mg once a day as needed.  Review of Systems Intermittent left knee pain, intermittent urinary urgency, intermittent incontinence    Past Medical History  Diagnosis Date  . Elevated blood pressure reading without diagnosis of hypertension     PT MONITORS HER B/P AT HOME  . 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 Arthritis KNEES  . 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 Chronic anticoagulation HX  PORT-A-CATH CLOT    2010 - HAS BEEN ON LOVENOX SINCE THE CLOT  . Skin cancer of anterior chest BREAST CANCER PRIMARY W/ METS TO CHEST WALL SKIN CANCER--  CHEMOTHERAPY EVERY 3 WEEKS AT [facility]    History   Social History  . Marital Status: Married    Spouse Name: N/A    Number of Children: 3  . Years  of Education: N/A   Occupational History  . 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 Smoking status: Never Smoker   . Smokeless tobacco: Never Used  . Alcohol Use: No  . Drug Use: No  . Sexual Activity: Not on file   Other Topics Concern  . 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          Past Surgical History  Procedure Laterality Date  . Left modified radical mastectomy/ right total mastectomy  11-13-2007    LEFT BREAST CANCER W/ AXILLARY LYMPH NODE METASTASIS AND POST NEOADJUVANT CHEMO  . Placement port-a-cath  06-22-2007  . Transthoracic echocardiogram  11-18-2011  DR BENSIMHON (ECHO EVERY 3 MONTHS)    HX CHEMO INDUCED CARDIOTOXICITY/  LVSF NORMAL/ EF 55-50%/ MILD MITRIAL VALVE REGURG./ MILDLY INCREASED SYSTOLIC PRESSURE OF PULMONARY ARTERIES  . Knee arthroscopy  02/13/2012    Procedure: ARTHROSCOPY KNEE;  Surgeon: Johnn Hai, MD;  Location: [facility];  Service: Orthopedics;  Laterality: Left;  WITH DEBRIDEMENt   . Knee arthroscopy with lateral menisectomy  02/13/2012    Procedure: KNEE ARTHROSCOPY WITH LATERAL MENISECTOMY;  Surgeon: Johnn Hai, MD;  Location: [facility];  Service: Orthopedics;;  partial  . Total knee arthroplasty Left 09/23/2012    Procedure: LEFT TOTAL KNEE ARTHROPLASTY;  Surgeon: Johnn Hai, MD;  Location: [facility];  Service: Orthopedics;  Laterality: Left;  . Chest wall tumor excision    . Skin graft      chest-post tumor removal    Family History  Problem Relation Age of Onset  . Heart disease Mother     due to mitral valve regurgiation,   . Cancer Mother     breast  . Parkinsonism Father   . Stroke Sister   . Alcohol abuse Paternal Grandfather     Allergies  Allergen Reactions  . Tape Hives    Can tolerate paper tape  . Other Rash     STERI STRIPS - Blisters  . Penicillins Rash    Denies airway involvement    Current Outpatient Prescriptions on File Prior to Visit  Medication Sig Dispense Refill  . Ado-Trastuzumab Emtansine (KADCYLA IV) Inject into the vein. 2.75m/kg/ [facility]      . cephALEXin (KEFLEX) 500 MG capsule Take 1 capsule (500 mg total) by mouth 3 (three) times daily.  21 capsule  0  . Cholecalciferol (VITAMIN D3) 2000 UNITS TABS Take 1 tablet by mouth 3 (three) times a week.       . docusate sodium 100 MG CAPS Take 100 mg by mouth 2 (two) times daily.  10 capsule    . enoxaparin (LOVENOX) 80 MG/0.8ML injection Inject 0.7 mLs (70 mg total) into the skin daily.  0 Syringe    . ferrous sulfate 325 (65 FE) MG tablet Take 325 mg by mouth 2 (two) times daily with a meal.      . gabapentin (NEURONTIN) 300 MG capsule Take 1 capsule (300 mg total) by mouth 3 (three) times daily.  90 capsule  5  . omeprazole (PRILOSEC) 20 MG capsule Take 20 mg by mouth daily as needed (for acid reflux-- rarely takes).       . ondansetron (ZOFRAN) 8 MG tablet Take 1 tablet (8 mg total) by mouth every 8 (eight) hours as needed for nausea.      . traMADol (ULTRAM) 50 MG tablet Take 50 mg by mouth every 12 (twelve) hours as needed for moderate pain.      .Marland Kitchenzolpidem (AMBIEN) 5 MG tablet Take 5 mg by mouth at bedtime as needed for sleep.       No current facility-administered medications on file prior to visit.    BP 104/62  Pulse 88  Temp(Src) 99.8 F (37.7 C) (Oral)  Ht 5' 7"  (1.702 m)  Wt 168 lb (76.204 kg)  BMI 26.31 kg/m2  LMP 06/22/2007    Objective:   Physical Exam  Constitutional: She is oriented to person, place, and time. She appears well-developed and well-nourished. No distress.  HENT:  Head: Normocephalic and atraumatic.  Neck: Neck supple.  Cardiovascular: Normal rate, regular rhythm and normal heart sounds.   No murmur heard. Pulmonary/Chest: Effort normal and breath  sounds normal. She has no wheezes.  Musculoskeletal: She exhibits no edema.  Lymphadenopathy:    She has no cervical adenopathy.  Neurological: She is alert and oriented to person, place, and time. No cranial nerve deficit.  Psychiatric: She has a normal mood and affect. Her behavior is normal.          Assessment & Plan:

## 2014-01-14 LAB — URINE CULTURE: Colony Count: >=100000

## 2014-01-16 ENCOUNTER — Telehealth (HOSPITAL_BASED_OUTPATIENT_CLINIC_OR_DEPARTMENT_OTHER): Payer: Self-pay

## 2014-01-16 NOTE — Telephone Encounter (Signed)
Post ED Visit - Positive Culture Follow-up  Culture report reviewed by antimicrobial stewardship pharmacist: []  Wes Dulaney, Pharm.D., BCPS []  Heide Guile, Pharm.D., BCPS []  Alycia Rossetti, Pharm.D., BCPS []  [place], Pharm.D., BCPS, AAHIVP [x]  Legrand Como, Pharm.D., BCPS, AAHIVP []  Hassie Bruce, Pharm.D. []  Cassie Nicole Kindred, [place].D.  Positive Urine culture, >/= 100,000 colonies -> Klebsiella Pneumoniae Treated with Cephalexin, organism sensitive to the same and no further patient follow-up is required at this time.  Dortha Kern 01/16/2014, 5:16 AM

## 2014-01-23 ENCOUNTER — Telehealth (HOSPITAL_COMMUNITY): Payer: Self-pay | Admitting: Vascular Surgery

## 2014-01-26 ENCOUNTER — Encounter (HOSPITAL_COMMUNITY): Payer: Self-pay | Admitting: Vascular Surgery

## 2014-02-24 NOTE — Telephone Encounter (Signed)
Open in error

## 2014-02-27 ENCOUNTER — Ambulatory Visit (HOSPITAL_COMMUNITY)
Admission: RE | Admit: 2014-02-27 | Discharge: 2014-02-27 | Disposition: A | Discharge status: Home / Self Care | Payer: 59 | Source: Ambulatory Visit | Attending: Internal Medicine | Admitting: Internal Medicine

## 2014-02-27 DIAGNOSIS — I059 Rheumatic mitral valve disease, unspecified: Secondary | ICD-10-CM

## 2014-02-27 DIAGNOSIS — C50912 Malignant neoplasm of unspecified site of left female breast: Secondary | ICD-10-CM | POA: Insufficient documentation

## 2014-02-27 DIAGNOSIS — C50919 Malignant neoplasm of unspecified site of unspecified female breast: Secondary | ICD-10-CM

## 2014-02-27 NOTE — Progress Notes (Signed)
  Echocardiogram 2D Echocardiogram has been performed.  Aubrey Voong FRANCES 02/27/2014, 9:55 AM

## 2014-03-30 ENCOUNTER — Encounter: Payer: Self-pay | Admitting: Internal Medicine

## 2014-03-30 DIAGNOSIS — R945 Abnormal results of liver function studies: Principal | ICD-10-CM

## 2014-03-30 DIAGNOSIS — R7989 Other specified abnormal findings of blood chemistry: Secondary | ICD-10-CM

## 2014-03-30 DIAGNOSIS — M1712 Unilateral primary osteoarthritis, left knee: Secondary | ICD-10-CM

## 2014-03-30 MED ORDER — ZOLPIDEM TARTRATE 5 MG PO TABS: 5.0000 mg | ORAL_TABLET | Freq: Every evening | ORAL | Status: DC | PRN

## 2014-03-30 MED ORDER — CELECOXIB 200 MG PO CAPS: 200.0000 mg | ORAL_CAPSULE | Freq: Two times a day (BID) | ORAL | Status: DC

## 2014-05-14 ENCOUNTER — Encounter: Payer: Self-pay | Admitting: Internal Medicine

## 2014-06-05 ENCOUNTER — Encounter: Payer: Self-pay | Admitting: Family Medicine

## 2014-06-05 ENCOUNTER — Ambulatory Visit (INDEPENDENT_AMBULATORY_CARE_PROVIDER_SITE_OTHER): Payer: 59 | Admitting: Family Medicine

## 2014-06-05 VITALS — BP 110/66 | HR 98 | Temp 98.5°F | Wt 175.0 lb

## 2014-06-05 DIAGNOSIS — H109 Unspecified conjunctivitis: Secondary | ICD-10-CM

## 2014-06-05 DIAGNOSIS — J0100 Acute maxillary sinusitis, unspecified: Secondary | ICD-10-CM

## 2014-06-05 MED ORDER — LEVOFLOXACIN 500 MG PO TABS: 500.0000 mg | ORAL_TABLET | Freq: Every day | ORAL | Status: DC

## 2014-06-05 MED ORDER — POLYMYXIN B-TRIMETHOPRIM 10000-0.1 UNIT/ML-% OP SOLN: 2.0000 [drp] | OPHTHALMIC | Status: DC

## 2014-06-05 NOTE — Progress Notes (Signed)
Pre visit review using our clinic review tool, if applicable. No additional management support is needed unless otherwise documented below in the visit note. 

## 2014-06-05 NOTE — Progress Notes (Signed)
Subjective:    Patient ID: [name], female    DOB: Jan 09, 1956, 59 y.o.   MRN: [id number]  HPI Patient seen with 3-4 day history of facial pressure, yellowish nasal discharge, intermittent headaches, malaise. Denies any cough. She's taken Zyrtec and saline nasal irrigation without much improvement. She also developed yesterday some redness of the right eye with crusted yellowish drainage this morning. This eventually improved with warm compresses. She's not had any fever. She has allergies to penicillin. She is previously taken Ceftin had nausea.  Past medical history is significant for breast cancer and she is currently on chemotherapy.  Past Medical History  Diagnosis Date  . Elevated blood pressure reading without diagnosis of hypertension     PT MONITORS HER B/P AT HOME  . 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 Arthritis KNEES  . 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 Chronic anticoagulation HX  PORT-A-CATH CLOT    2010 - HAS BEEN ON LOVENOX SINCE THE CLOT  . Skin cancer of anterior chest BREAST CANCER PRIMARY W/ METS TO CHEST WALL SKIN CANCER--  CHEMOTHERAPY EVERY 3 WEEKS AT [facility]   Past Surgical History  Procedure Laterality Date  . Left modified radical mastectomy/ right total mastectomy  11-13-2007    LEFT BREAST CANCER W/ AXILLARY LYMPH NODE METASTASIS AND POST NEOADJUVANT CHEMO  . Placement port-a-cath  06-22-2007  . Transthoracic echocardiogram  11-18-2011  DR BENSIMHON (ECHO EVERY 3 MONTHS)    HX CHEMO INDUCED CARDIOTOXICITY/  LVSF NORMAL/ EF 55-50%/ MILD MITRIAL VALVE REGURG./ MILDLY INCREASED SYSTOLIC PRESSURE OF PULMONARY ARTERIES  . Knee arthroscopy  02/13/2012    Procedure: ARTHROSCOPY KNEE;  Surgeon: Johnn Hai, MD;  Location: [facility];  Service: Orthopedics;  Laterality: Left;  WITH DEBRIDEMENt   .  Knee arthroscopy with lateral menisectomy  02/13/2012    Procedure: KNEE ARTHROSCOPY WITH LATERAL MENISECTOMY;  Surgeon: Johnn Hai, MD;  Location: [facility];  Service: Orthopedics;;  partial  . Total knee arthroplasty Left 09/23/2012    Procedure: LEFT TOTAL KNEE ARTHROPLASTY;  Surgeon: Johnn Hai, MD;  Location: [facility];  Service: Orthopedics;  Laterality: Left;  . Chest wall tumor excision    . Skin graft      chest-post tumor removal    reports that she has never smoked. She has never used smokeless tobacco. She reports that she does not drink alcohol or use illicit drugs. family history includes Alcohol abuse in her paternal grandfather; Cancer in her mother; Heart disease in her mother; Parkinsonism in her father; Stroke in her sister. Allergies  Allergen Reactions  . Tape Hives    Can tolerate paper tape  . Other Rash    STERI STRIPS - Blisters  . Penicillins Rash    Denies airway involvement      Review of Systems  Constitutional: Positive for fatigue. Negative for fever and chills.  HENT: Positive for congestion and sinus pressure.   Eyes: Positive for discharge and redness. Negative for pain and visual disturbance.  Respiratory: Negative for cough.   Cardiovascular: Negative for chest pain.  Neurological: Positive for headaches.       Objective:   Physical Exam  Constitutional: She appears well-developed and well-nourished.  HENT:  Right Ear: External ear normal.  Left Ear: External ear normal.  Mouth/Throat: Oropharynx is clear and moist.  Erythematous nasal mucosa otherwise clear  Eyes:  Right conjunctiva is erythematous. No secretions noted at this time  Neck: Neck supple.  Cardiovascular: Normal rate and regular rhythm.   Pulmonary/Chest: Effort normal and breath sounds normal. No respiratory distress. She has no wheezes. She has no rales.  Lymphadenopathy:    She has no cervical adenopathy.          Assessment & Plan:  Acute  conjunctivitis right eye, suspect bacterial. Also suspect acute sinusitis. Even though duration of symptoms is not very long, she is immunocompromised on chemotherapy and has localized right maxillary facial pressure along with described purulent-like discharge. Start Levaquin 500 milligrams once daily for 10 days. Polytrim  ophthalmic drops. Stay hydrated. Follow-up as needed

## 2014-06-30 ENCOUNTER — Telehealth: Payer: Self-pay

## 2014-06-30 MED ORDER — TRAMADOL HCL 50 MG PO TABS: 50.0000 mg | ORAL_TABLET | Freq: Two times a day (BID) | ORAL | Status: DC | PRN

## 2014-06-30 NOTE — Telephone Encounter (Signed)
Ok to RF x 3 

## 2014-06-30 NOTE — Telephone Encounter (Signed)
rx called in to [facility]

## 2014-06-30 NOTE — Telephone Encounter (Signed)
Rx request for Tramadol HCL 50 mg tablet-Take 1 tablet by mouth every 12 hours as needed for pain #60  Pharm:  [facility].

## 2014-07-07 ENCOUNTER — Other Ambulatory Visit (HOSPITAL_COMMUNITY): Payer: Self-pay | Admitting: Cardiology

## 2014-07-07 DIAGNOSIS — C50919 Malignant neoplasm of unspecified site of unspecified female breast: Secondary | ICD-10-CM

## 2014-07-14 ENCOUNTER — Ambulatory Visit (HOSPITAL_BASED_OUTPATIENT_CLINIC_OR_DEPARTMENT_OTHER)
Admission: RE | Admit: 2014-07-14 | Discharge: 2014-07-14 | Disposition: A | Discharge status: Home / Self Care | Payer: 59 | Source: Ambulatory Visit | Attending: Internal Medicine | Admitting: Internal Medicine

## 2014-07-14 ENCOUNTER — Encounter (HOSPITAL_COMMUNITY): Payer: Self-pay

## 2014-07-14 ENCOUNTER — Ambulatory Visit (HOSPITAL_COMMUNITY)
Admission: RE | Admit: 2014-07-14 | Discharge: 2014-07-14 | Disposition: A | Discharge status: Home / Self Care | Payer: 59 | Source: Ambulatory Visit | Attending: Internal Medicine | Admitting: Internal Medicine

## 2014-07-14 VITALS — BP 130/67 | HR 87 | Wt 170.5 lb

## 2014-07-14 DIAGNOSIS — Z79899 Other long term (current) drug therapy: Secondary | ICD-10-CM | POA: Insufficient documentation

## 2014-07-14 DIAGNOSIS — C792 Secondary malignant neoplasm of skin: Secondary | ICD-10-CM | POA: Diagnosis not present

## 2014-07-14 DIAGNOSIS — C50912 Malignant neoplasm of unspecified site of left female breast: Secondary | ICD-10-CM

## 2014-07-14 DIAGNOSIS — C50919 Malignant neoplasm of unspecified site of unspecified female breast: Secondary | ICD-10-CM | POA: Insufficient documentation

## 2014-07-14 DIAGNOSIS — Z5111 Encounter for antineoplastic chemotherapy: Secondary | ICD-10-CM

## 2014-07-14 NOTE — Addendum Note (Signed)
Encounter addended by: Renee Pain, RN on: 07/14/2014 11:52 AM<BR>     Documentation filed: Patient Instructions Section

## 2014-07-14 NOTE — Progress Notes (Signed)
Patient ID: [name], female   DOB: 04-27-1956, 59 y.o.   MRN: [id number]     Cardiology Consult  Referring: Dr. 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   She is s/p bilateral mastectomies, XRT, chemo. She is currently on Kadcyla (trastuzamab-emantsine) every 3 weeks. Underwent skin flap for skim metastasis on her abdomen in 2/15  She returns for follow up. Doing well. Exercising 1-2 days a week. She continues on [place] every 3 weeks. Denies SOB/Orthopnea. Hemoglobin remains low at 9.3. Taking iron.   ECHO 09/27/2013 EF 60-65% Lateral S'10.7 Grade 2 DD ECHO 02/27/2014 EF 60-65% Lateral S' 10.4  ECHO 07/14/2014  EF 60-65% Lateral S' 10.4 GLS -23.1% mild MR    Past Medical History  Diagnosis Date  . Elevated blood pressure reading without diagnosis of hypertension     PT MONITORS HER B/P AT HOME  . 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 Arthritis KNEES  . 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 Chronic anticoagulation HX  PORT-A-CATH CLOT    2010 - HAS BEEN ON LOVENOX SINCE THE CLOT  . Skin cancer of anterior chest BREAST CANCER PRIMARY W/ METS TO CHEST WALL SKIN CANCER--  CHEMOTHERAPY EVERY 3 WEEKS AT [facility]    Current Outpatient Prescriptions  Medication Sig Dispense Refill  . Ado-Trastuzumab Emtansine (KADCYLA IV) Inject into the vein. 2.69m/kg/ [facility]    . celecoxib (CELEBREX) 200 MG capsule Take 1 capsule (200 mg total) by mouth 2 (two) times daily. 60 capsule 5  . Cholecalciferol (VITAMIN D3) 2000 UNITS TABS Take 1 tablet by mouth 3 (three) times a week.     . docusate sodium 100 MG CAPS Take 100 mg by mouth 2 (two) times daily. 10 capsule   . enoxaparin (LOVENOX) 80 MG/0.8ML injection Inject 0.7 mLs (70 mg  total) into the skin daily. 0 Syringe   . gabapentin (NEURONTIN) 300 MG capsule Take 1 capsule (300 mg total) by mouth 3 (three) times daily. 90 capsule 5  . levofloxacin (LEVAQUIN) 500 MG tablet Take 1 tablet (500 mg total) by mouth daily. 10 tablet 0  . mirabegron ER (MYRBETRIQ) 50 MG TB24 tablet Take 1 tablet (50 mg total) by mouth daily. 90 tablet 3  . omeprazole (PRILOSEC) 20 MG capsule Take 20 mg by mouth daily as needed (for acid reflux-- rarely takes).     . ondansetron (ZOFRAN) 8 MG tablet Take 1 tablet (8 mg total) by mouth every 8 (eight) hours as needed for nausea.    . traMADol (ULTRAM) 50 MG tablet Take 1 tablet (50 mg total) by mouth every 12 (twelve) hours as needed for moderate pain. 30 tablet 3  . trimethoprim-polymyxin b (POLYTRIM) ophthalmic solution Place 2 drops into the right eye every 4 (four) hours. 10 mL 0  . zolpidem (AMBIEN) 5 MG tablet Take 1 tablet (5 mg total) by mouth at bedtime as needed for sleep. 30 tablet 3   No current facility-administered medications for this encounter.    Allergies  Allergen Reactions  . Tape Hives    Can tolerate paper tape  . Other Rash    STERI STRIPS - Blisters  . Penicillins Rash  Denies airway involvement    History   Social History  . Marital Status: Married    Spouse Name: N/A  . Number of Children: 3  . Years of Education: N/A   Occupational History  . 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 Smoking status: Never Smoker   . Smokeless tobacco: Never Used  . Alcohol Use: No  . Drug Use: No  . Sexual Activity: Not on file   Other Topics Concern  . 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          Family History  Problem Relation Age of Onset  . Heart disease Mother     due to mitral valve regurgiation,   . Cancer Mother     breast  .  Parkinsonism Father   . Stroke Sister   . Alcohol abuse Paternal Grandfather     PHYSICAL EXAM: Filed Vitals:   07/14/14 1026  BP: 130/67  Pulse: 87  Weight: 170 lb 7.5 oz (77.325 kg)  SpO2: 97%   General:  Well appearing. No respiratory difficulty HEENT: normal Neck: supple. no JVD. Carotids 2+ bilat; no bruits. No lymphadenopathy or thryomegaly appreciated. Cor: PMI nondisplaced. Regular rate & rhythm. No rubs, gallops or murmurs. Lungs: clear Abdomen: soft, nontender, nondistended. No hepatosplenomegaly. No bruits or masses. Good bowel sounds. Extremities: no cyanosis, clubbing, rash, tr edema Neuro: alert & oriented x 3, cranial nerves grossly intact. moves all 4 extremities w/o difficulty. Affect pleasant.    ASSESSMENT & PLAN: 1. Breast CA - HER 2-neu+ , ER/PR -, BRCA - 2. Anemia  Plan to repeat ECHO in 4 months  CLEGG,AMY NP-C  10:03 AM  Patient seen and examined with Darrick Grinder, NP. We discussed all aspects of the encounter. I agree with the assessment and plan as stated above.   I reviewed echos personally. EF and Doppler parameters stable. No HF on exam. Continue Kadcyla. Repeat echo 4 months.  Daniel Bensimhon,MD 11:48 AM

## 2014-07-14 NOTE — Progress Notes (Signed)
  Echocardiogram 2D Echocardiogram has been performed.  [name] M 07/14/2014, 10:25 AM

## 2014-07-14 NOTE — Patient Instructions (Signed)
Doing great!  Follow up 4 months with echocardiogram.

## 2014-07-17 MED ORDER — FUROSEMIDE 20 MG PO TABS: 20.0000 mg | ORAL_TABLET | Freq: Every day | ORAL | Status: DC | PRN

## 2014-07-17 NOTE — Addendum Note (Signed)
Encounter addended by: Scarlette Calico, RN on: 07/17/2014 12:25 PM<BR>     Documentation filed: Orders

## 2014-07-27 ENCOUNTER — Encounter: Payer: Self-pay | Admitting: Internal Medicine

## 2014-07-31 ENCOUNTER — Other Ambulatory Visit: Payer: Self-pay | Admitting: Internal Medicine

## 2014-08-01 ENCOUNTER — Telehealth: Payer: Self-pay | Admitting: Internal Medicine

## 2014-08-01 NOTE — Telephone Encounter (Signed)
Pt needs refill on ambien call into [facility]

## 2014-08-02 ENCOUNTER — Encounter: Payer: Self-pay | Admitting: Podiatrist

## 2014-08-02 ENCOUNTER — Ambulatory Visit (INDEPENDENT_AMBULATORY_CARE_PROVIDER_SITE_OTHER): Payer: Managed Care, Other (non HMO) | Admitting: Podiatrist

## 2014-08-02 DIAGNOSIS — M216X1 Other acquired deformities of right foot: Secondary | ICD-10-CM | POA: Diagnosis not present

## 2014-08-02 DIAGNOSIS — Q828 Other specified congenital malformations of skin: Secondary | ICD-10-CM

## 2014-08-02 MED ORDER — ZOLPIDEM TARTRATE 5 MG PO TABS: 5.0000 mg | ORAL_TABLET | Freq: Every evening | ORAL | Status: DC | PRN

## 2014-08-02 NOTE — Progress Notes (Signed)
   Subjective:    Patient ID: [name], female    DOB: 19-Mar-1956, 59 y.o.   MRN: [id number]  HPI  PT STATED RT FOOT CALLUS IS SORE FOR 2 MONTHS. THE CALLUS IS GETTING THICKER AND GET AGGRAVATED BY PRESSURE. TRIED THE CALLUS REMOVAL BUT NO HELP.  ALSO, NEED PAIR ORTHOTICS. She is on her feet for 10 hour shifts/ 4 days per week at [facility].   Review of Systems     Objective:   Physical Exam GENERAL APPEARANCE: Alert, conversant. Appropriately groomed. No acute distress.  VASCULAR: Pedal pulses palpable at 2/4 DP and PT bilateral.  Capillary refill time is immediate to all digits,  Proximal to distal cooling it warm to warm.  Digital hair growth is present bilateral  NEUROLOGIC: sensation is intact epicritically and protectively to 5.07 monofilament at 5/5 sites bilateral.  Light touch is intact bilateral, vibratory sensation intact bilateral, achilles tendon reflex is intact bilateral.  MUSCULOSKELETAL: acceptable muscle strength, tone and stability bilateral.  Intrinsic muscluature intact bilateral.  Plantarflexed fifth metatarsal right is present  DERMATOLOGIC: skin color, texture, and turger are within normal limits.  Hyperkeratotic enucleated lesion is present some metatarsal 5 at the right foot. It is painful with pressure and debridement.     Assessment & Plan:  Prominent metatarsal head, callus and metatarsal 5 right  Plan: Debridement of the callus was accomplished today without complication. Orthotics were ordered for her as well and she will be seen back when they're ready for pickup. Hopefully This will slow down the formation of the callus in the future.

## 2014-08-02 NOTE — Telephone Encounter (Signed)
Ok to RF x 3 

## 2014-08-02 NOTE — Telephone Encounter (Signed)
rx sent in electronically 

## 2014-08-16 ENCOUNTER — Encounter: Payer: Self-pay | Admitting: Podiatrist

## 2014-08-16 ENCOUNTER — Ambulatory Visit (INDEPENDENT_AMBULATORY_CARE_PROVIDER_SITE_OTHER): Payer: Managed Care, Other (non HMO) | Admitting: Podiatrist

## 2014-08-16 VITALS — BP 119/64 | HR 100 | Resp 12

## 2014-08-16 DIAGNOSIS — L600 Ingrowing nail: Secondary | ICD-10-CM

## 2014-08-16 NOTE — Patient Instructions (Signed)

## 2014-08-16 NOTE — Progress Notes (Signed)
   Subjective:    Patient ID: [name], female    DOB: 1955/10/05, 59 y.o.   MRN: [id number]  HPI  PT STATED LT FOOT GREAT TOENAIL HAVE IS BEEN SORE AND HAVE PUS COMING OUT FOR 3 DAYS. THE TOENAIL IS STILL SWOLLEN BUT LOOK A LITTLE BETTER AND NOT BLEEDING. THE TOENAIL GET AGGRAVATED BY PRESSURE AND TRIED TO KEEP IT CLEAN.  Review of Systems  Skin: Positive for color change.  All other systems reviewed and are negative.      Objective:   Physical Exam Neurovascular status unchanged from recent visit.  Left great toe does appear to be locally infected with minimal pus along the lateral border.  Redness and pain are also present.         Assessment & Plan:  Paronychia left great toe.  Recommended and incision and drainage to the left great toe and the patient agreed.  The toe was prepped with betadine and exsanguinated and the nail was removed.  Antibiotic ointment and a dressing was applied and she was given instructions for aftercare.  If any concerns arise in the future she is instructed to call.

## 2014-08-25 ENCOUNTER — Other Ambulatory Visit: Payer: Self-pay | Admitting: Internal Medicine

## 2014-08-27 ENCOUNTER — Encounter: Payer: Self-pay | Admitting: Internal Medicine

## 2014-08-31 ENCOUNTER — Other Ambulatory Visit: Payer: Self-pay | Admitting: Occupational Medicine

## 2014-08-31 ENCOUNTER — Ambulatory Visit: Payer: Self-pay

## 2014-08-31 DIAGNOSIS — M25562 Pain in left knee: Secondary | ICD-10-CM

## 2014-09-06 ENCOUNTER — Ambulatory Visit: Payer: Managed Care, Other (non HMO) | Admitting: *Deleted

## 2014-09-06 DIAGNOSIS — M216X1 Other acquired deformities of right foot: Secondary | ICD-10-CM

## 2014-09-06 NOTE — Patient Instructions (Signed)

## 2014-09-07 NOTE — Progress Notes (Signed)
Patient ID: [name], female   DOB: 1956/02/23, 59 y.o.   MRN: [id number] PICKING UP INSERTS

## 2014-09-08 ENCOUNTER — Encounter (HOSPITAL_COMMUNITY): Payer: Self-pay

## 2014-09-08 ENCOUNTER — Emergency Department (HOSPITAL_COMMUNITY): Payer: 59

## 2014-09-08 ENCOUNTER — Emergency Department (HOSPITAL_COMMUNITY)
Admission: EM | Admit: 2014-09-08 | Discharge: 2014-09-08 | Disposition: A | Discharge status: Home / Self Care | Payer: 59 | Attending: Emergency Medicine | Admitting: Emergency Medicine

## 2014-09-08 DIAGNOSIS — Z79899 Other long term (current) drug therapy: Secondary | ICD-10-CM | POA: Diagnosis not present

## 2014-09-08 DIAGNOSIS — Z8742 Personal history of other diseases of the female genital tract: Secondary | ICD-10-CM | POA: Diagnosis not present

## 2014-09-08 DIAGNOSIS — M25561 Pain in right knee: Secondary | ICD-10-CM | POA: Diagnosis present

## 2014-09-08 DIAGNOSIS — Z7901 Long term (current) use of anticoagulants: Secondary | ICD-10-CM | POA: Insufficient documentation

## 2014-09-08 DIAGNOSIS — Z853 Personal history of malignant neoplasm of breast: Secondary | ICD-10-CM | POA: Insufficient documentation

## 2014-09-08 DIAGNOSIS — M25461 Effusion, right knee: Secondary | ICD-10-CM | POA: Diagnosis not present

## 2014-09-08 DIAGNOSIS — Z85828 Personal history of other malignant neoplasm of skin: Secondary | ICD-10-CM | POA: Insufficient documentation

## 2014-09-08 DIAGNOSIS — Z880 Allergy status to penicillin: Secondary | ICD-10-CM | POA: Insufficient documentation

## 2014-09-08 DIAGNOSIS — Z96652 Presence of left artificial knee joint: Secondary | ICD-10-CM | POA: Insufficient documentation

## 2014-09-08 DIAGNOSIS — M1711 Unilateral primary osteoarthritis, right knee: Secondary | ICD-10-CM | POA: Insufficient documentation

## 2014-09-08 DIAGNOSIS — Z791 Long term (current) use of non-steroidal anti-inflammatories (NSAID): Secondary | ICD-10-CM | POA: Diagnosis not present

## 2014-09-08 MED ORDER — HYDROCODONE-ACETAMINOPHEN 5-325 MG PO TABS
2.0000 | ORAL_TABLET | Freq: Once | ORAL | Status: AC
  Administered 2014-09-08: 2 via ORAL
  Filled 2014-09-08: qty 2

## 2014-09-08 MED ORDER — HYDROCODONE-ACETAMINOPHEN 5-325 MG PO TABS: 1.0000 | ORAL_TABLET | Freq: Four times a day (QID) | ORAL | Status: DC | PRN

## 2014-09-08 NOTE — ED Notes (Signed)
Pt alert, oriented, and wheeled to the vehicle with husband driving. She was advised to elevate knee and use ice 3-4 times per day. She was also advised to follow up with PCP and Orthopedic doctor.

## 2014-09-08 NOTE — Discharge Instructions (Signed)
Be sure to keep knee elevate and use ice 3-4 times a day for 15-20 minutes at a time.  You may take your pain medication, norco, as prescribed for pain.  Do not drive or operate machinery while taking as this medication may cause drowsiness.  Follow up with your orthopedist for further evaluation and continued management of Right knee pain.

## 2014-09-08 NOTE — ED Provider Notes (Signed)
CSN: [id number]     Arrival date & time 09/08/14  0556 History   First MD Initiated Contact with Patient 09/08/14 0617     Chief Complaint  Patient presents with  . Knee Pain     (Consider location/radiation/quality/duration/timing/severity/associated sxs/prior Treatment) HPI Pt is a 59yo female with hx of osteoarthritis with Left total left knee replacement, presenting to ED with c/o Right knee pain.  Pt states she was seen by Dr. Tonita Cong, orthopedist, about 1 week ago for Left knee pain and joint effusion due to a recent fall.  Since then, she has been having to put more weight on her Right leg.  Now, she has increased pain and swelling to Right knee since yesterday. Pain is constant, aching sore, with sharp pain, 10/10 at worst with ambulating, 6/10 at rest.   Pain is worst in medial aspect Right knee.  Pt states she has been taking tramadol with minimal relief. No recent falls or known injuries with Right knee.  Denies numbness or tingling in Right leg. No calf pain.    Past Medical History  Diagnosis Date  . Elevated blood pressure reading without diagnosis of hypertension     PT MONITORS HER B/P AT HOME  . 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 Arthritis KNEES  . 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 Chronic anticoagulation HX  PORT-A-CATH CLOT    2010 - HAS BEEN ON LOVENOX SINCE THE CLOT  . Skin cancer of anterior chest BREAST CANCER PRIMARY W/ METS TO CHEST WALL SKIN CANCER--  CHEMOTHERAPY EVERY 3 WEEKS AT [facility]   Past Surgical History  Procedure Laterality Date  . Left modified radical mastectomy/ right total mastectomy  11-13-2007    LEFT BREAST CANCER W/ AXILLARY LYMPH NODE METASTASIS AND POST NEOADJUVANT CHEMO  . Placement port-a-cath  06-22-2007  . Transthoracic echocardiogram  11-18-2011  DR BENSIMHON (ECHO EVERY 3 MONTHS)    HX CHEMO  INDUCED CARDIOTOXICITY/  LVSF NORMAL/ EF 55-50%/ MILD MITRIAL VALVE REGURG./ MILDLY INCREASED SYSTOLIC PRESSURE OF PULMONARY ARTERIES  . Knee arthroscopy  02/13/2012    Procedure: ARTHROSCOPY KNEE;  Surgeon: Johnn Hai, MD;  Location[facility];  Service: Orthopedics;  Laterality: Left;  WITH DEBRIDEMENt   . Knee arthroscopy with lateral menisectomy  02/13/2012    Procedure: KNEE ARTHROSCOPY WITH LATERAL MENISECTOMY;  Surgeon: Johnn Hai, MD;  Location: [facility];  Service: Orthopedics;;  partial  . Total knee arthroplasty Left 09/23/2012    Procedure: LEFT TOTAL KNEE ARTHROPLASTY;  Surgeon: Johnn Hai, MD;  Location: [facility];  Service: Orthopedics;  Laterality: Left;  . Chest wall tumor excision    . Skin graft      chest-post tumor removal   Family History  Problem Relation Age of Onset  . Heart disease Mother     due to mitral valve regurgiation,   . Cancer Mother     breast  . Parkinsonism Father   . Stroke Sister   . Alcohol abuse Paternal Grandfather    History  Substance Use Topics  . Smoking status: Never Smoker   . Smokeless tobacco: Never Used  . Alcohol Use: No   OB History    No data available     Review of Systems  Constitutional: Negative for fever and chills.  Musculoskeletal: Positive for myalgias, joint swelling and arthralgias.       Right knee pain  Skin: Negative for color change, rash and wound.  Neurological: Negative for weakness and numbness.  All other systems reviewed and are negative.     Allergies  Tape; Other; and Penicillins  Home Medications   Prior to Admission medications   Medication Sig Start Date End Date Taking? Authorizing Provider  celecoxib (CELEBREX) 200 MG capsule Take 1 capsule (200 mg total) by mouth 2 (two) times daily. 03/30/14  Yes Doe-Hyun Kyra Searles, DO  Cholecalciferol (VITAMIN D3) 2000 UNITS TABS Take 1 tablet by mouth 3 (three) times a week.    Yes Historical Provider, MD   darifenacin (ENABLEX) 15 MG 24 hr tablet Take 15 mg by mouth daily.   Yes Historical Provider, MD  docusate sodium 100 MG CAPS Take 100 mg by mouth 2 (two) times daily. 09/26/12  Yes Matthew Babish, PA-C  enoxaparin (LOVENOX) 80 MG/0.8ML injection Inject 0.7 mLs (70 mg total) into the skin daily. 09/26/12  Yes Matthew Babish, PA-C  gabapentin (NEURONTIN) 300 MG capsule TAKE 1 CAPSULE BY MOUTH 3 TIMES DAILY. 08/25/14  Yes Doe-Hyun R Shawna Orleans, DO  traMADol (ULTRAM) 50 MG tablet Take 1 tablet (50 mg total) by mouth every 12 (twelve) hours as needed for moderate pain. 06/30/14  Yes Doe-Hyun R Shawna Orleans, DO  zolpidem (AMBIEN) 5 MG tablet Take 1 tablet (5 mg total) by mouth at bedtime as needed for sleep. 08/02/14  Yes Doe-Hyun Kyra Searles, DO  Ado-Trastuzumab Emtansine (KADCYLA IV) Inject into the vein. 2.$RemoveBef'5mg'MAPaXEAwfb$ /kg/ Chemotherapy drug given every 3 weeks at [place].    Historical Provider, MD  furosemide (LASIX) 20 MG tablet Take 1 tablet (20 mg total) by mouth daily as needed. 07/17/14   Jolaine Artist, MD  HYDROcodone-acetaminophen (NORCO/VICODIN) 5-325 MG per tablet Take 1-2 tablets by mouth every 6 (six) hours as needed. 09/08/14   Noland Fordyce, PA-C  omeprazole (PRILOSEC) 20 MG capsule Take 20 mg by mouth daily as needed (for acid reflux-- rarely takes).     Historical Provider, MD  ondansetron (ZOFRAN) 8 MG tablet Take 1 tablet (8 mg total) by mouth every 8 (eight) hours as needed for nausea. 06/19/11   Zenia Resides, MD   BP 148/74 mmHg  Pulse 109  Temp(Src) 98.6 F (37 C) (Oral)  Resp 16  Ht $R'5\' 8"'hv$  (1.727 m)  Wt 170 lb (77.111 kg)  BMI 25.85 kg/m2  SpO2 96%  LMP 06/22/2007 Physical Exam  Constitutional: She is oriented to person, place, and time. She appears well-developed and well-nourished.  HENT:  Head: Normocephalic and atraumatic.  Eyes: EOM are normal.  Neck: Normal range of motion.  Cardiovascular: Normal rate.   Pulses:      Dorsalis pedis pulses are 2+ on the right side.  Pulmonary/Chest:  Effort normal.  Musculoskeletal: Normal range of motion. She exhibits edema and tenderness.  Right knee: mild to moderate edema with diffuse tenderness, worse in medial aspect.  Limited knee flexion due to pain. No calf or thigh tenderness.  FROM right hip and ankle.   Neurological: She is alert and oriented to person, place, and time.  Skin: Skin is warm and dry.  Psychiatric: She has a normal mood and affect. Her behavior is normal.  Nursing note and vitals reviewed.   ED Course  Procedures (including critical care time) Labs Review Labs Reviewed - No data to display  Imaging Review Dg Knee Complete 4 Views  Right  09/08/2014   CLINICAL DATA:  Acute right knee pain after injury left week. Initial encounter.  EXAM: RIGHT KNEE - COMPLETE 4+ VIEW  COMPARISON:  None.  FINDINGS: No fracture or dislocation is noted. Mild suprapatellar joint effusion is noted. Moderate narrowing of the patellofemoral space is noted. Mild osteophyte formation is seen involving the medial and lateral joint spaces, as well as the tibial spines. No soft tissue abnormality is noted.  IMPRESSION: Tricompartmental degenerative joint disease is noted, which is most severe in the patellofemoral space. Mild suprapatellar joint effusion is noted. No fracture or dislocation is noted.   Electronically Signed   By: Marijo Conception, M.D.   On: 09/08/2014 07:21     EKG Interpretation None      MDM   Final diagnoses:  Right knee pain  Osteoarthritis of right knee, unspecified osteoarthritis type  Knee effusion, right    Pt is a 59yo female with hx of OA, presenting to ED with c/o Right knee pain for 1 day. No recent injury to Right knee.  Right leg is neurovascularly in tact. Pain with knee flexion and tenderness to medical joint space.  Edema noted on exam. Plain films: tricompartmental degenerative joint disease noted, most severe in patellofemoral space. Mild suprapatellar joint effusion noted.  No fracture or  dislocation noted.    Pt states she has a knee sleeve at home and walker.  Advised to f/u with her PCP as well as orthopedist for further evaluation and continued management of Right knee pain.  Rx: norco.   Pt verbalized understanding and agreement with tx plan.      Noland Fordyce, PA-C 09/08/14 [place], MD 09/08/14 [phone]

## 2014-09-08 NOTE — ED Notes (Signed)
Pt injured her left knee one week ago and was seen and treated by Dr Maxie Better, since then she was putting more weight on the right knee and now its swollen and sore

## 2014-10-09 ENCOUNTER — Encounter: Payer: Self-pay | Admitting: Internal Medicine

## 2014-10-09 MED ORDER — GABAPENTIN 300 MG PO CAPS: ORAL_CAPSULE | ORAL | Status: DC

## 2014-10-17 ENCOUNTER — Other Ambulatory Visit: Payer: Self-pay | Admitting: *Deleted

## 2014-10-17 NOTE — Patient Outreach (Signed)
Attempt made to call [employer] member, f/u on high cost claim - person answering the phone states member is not available. Plan to call again tomorrow  5/25.       Zara Chess.   [facility]  [phone]

## 2014-10-18 ENCOUNTER — Other Ambulatory Visit: Payer: Self-pay | Admitting: *Deleted

## 2014-10-18 NOTE — Patient Outreach (Signed)
Attempt made to contact [employer] member.   This RN CM called yesterday but did not leave contact number with person who answered the phone.     HIPPA compliant voice message left with contact number.  Plan to try again.         Zara Chess.   [facility]  [phone]

## 2014-10-24 ENCOUNTER — Other Ambulatory Visit: Payer: Self-pay | Admitting: *Deleted

## 2014-10-24 NOTE — Patient Outreach (Signed)
Another attempt (third) made to contact [employer] member (f/u on high cost claim, assess if any needs) but first call did not leave return contact number.   Therefore, will call one more time.   HIPPA compliant voice message left with contact number.      Zara Chess.   [place] [facility]  [phone]

## 2014-10-25 ENCOUNTER — Encounter: Payer: Self-pay | Admitting: Internal Medicine

## 2014-10-25 ENCOUNTER — Other Ambulatory Visit: Payer: Self-pay | Admitting: *Deleted

## 2014-10-25 NOTE — Patient Outreach (Signed)
Fourth  call made to f/u with [employer] member,which include 2 attempts- daughter answered and states member not home.   Provided contact number for member to return call to [facility].     If no response, will close case.     Zara Chess.   [place] [facility]  [phone]

## 2014-10-26 MED ORDER — TRAMADOL HCL 50 MG PO TABS: 50.0000 mg | ORAL_TABLET | Freq: Four times a day (QID) | ORAL | Status: DC | PRN

## 2014-10-26 NOTE — Telephone Encounter (Signed)
Rx called in to pharmacy. 

## 2014-11-27 ENCOUNTER — Encounter: Payer: Self-pay | Admitting: Internal Medicine

## 2014-11-29 MED ORDER — ZOLPIDEM TARTRATE 5 MG PO TABS: 5.0000 mg | ORAL_TABLET | Freq: Every evening | ORAL | Status: DC | PRN

## 2014-12-04 ENCOUNTER — Other Ambulatory Visit: Payer: Self-pay | Admitting: Obstetrics and Gynecology

## 2014-12-05 ENCOUNTER — Encounter: Payer: Self-pay | Admitting: Genetic Counselor

## 2014-12-05 LAB — CYTOLOGY - PAP

## 2014-12-07 ENCOUNTER — Other Ambulatory Visit: Payer: Self-pay | Admitting: Internal Medicine

## 2014-12-27 ENCOUNTER — Encounter: Payer: Self-pay | Admitting: Internal Medicine

## 2014-12-28 MED ORDER — GABAPENTIN 300 MG PO CAPS: ORAL_CAPSULE | ORAL | Status: DC

## 2015-01-01 ENCOUNTER — Encounter: Payer: Self-pay | Admitting: *Deleted

## 2015-01-08 ENCOUNTER — Other Ambulatory Visit: Payer: Self-pay | Admitting: Internal Medicine

## 2015-01-11 ENCOUNTER — Telehealth: Payer: Self-pay | Admitting: Internal Medicine

## 2015-01-11 NOTE — Telephone Encounter (Signed)
Pt is requesting an appt with dr Shawna Orleans on 01-19-15. Pt is having knee pain. Can I create a slot?

## 2015-01-12 NOTE — Telephone Encounter (Signed)
I think it would be better if pt saw another provide for knee pain.  (not sure if I will be able to work next week)

## 2015-01-16 NOTE — Telephone Encounter (Signed)
Pt aware and will sch w/ another provider

## 2015-01-16 NOTE — Telephone Encounter (Signed)
lmom for pt to call back

## 2015-01-19 ENCOUNTER — Ambulatory Visit (INDEPENDENT_AMBULATORY_CARE_PROVIDER_SITE_OTHER): Payer: 59 | Admitting: Family Medicine

## 2015-01-19 ENCOUNTER — Encounter: Payer: Self-pay | Admitting: Family Medicine

## 2015-01-19 VITALS — BP 130/80 | HR 106 | Temp 98.6°F | Wt 164.0 lb

## 2015-01-19 DIAGNOSIS — M25562 Pain in left knee: Secondary | ICD-10-CM | POA: Diagnosis not present

## 2015-01-19 NOTE — Progress Notes (Signed)
Subjective:    Patient ID: [name], female    DOB: 1956/01/08, 59 y.o.   MRN: [id number]  HPI Acute visit for left knee pain. Her pain has actually gone on for several months. She's had prior history of left total knee replacement about 3 years ago. She has seen her orthopedist as recently as 2 months ago. X-rays apparently unremarkable. She works in [employer] is on her feet much of the day. She has poorly localized knee pain. She takes Celebrex and tramadol which helped somewhat as well as icing. No recent erythema. Mild swelling. Mild warmth. No locking or giving way.  She does have metastatic breast cancer followed closely at [facility]. She states she had repeat bone scan just earlier today. She has tried elastic knee sleeve and previously used topical non-steroidal's without improvement  Past Medical History  Diagnosis Date  . Elevated blood pressure reading without diagnosis of hypertension     PT MONITORS HER B/P AT HOME  . 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 Arthritis KNEES  . 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 Chronic anticoagulation HX  PORT-A-CATH CLOT    2010 - HAS BEEN ON LOVENOX SINCE THE CLOT  . Skin cancer of anterior chest BREAST CANCER PRIMARY W/ METS TO CHEST WALL SKIN CANCER--  CHEMOTHERAPY EVERY 3 WEEKS AT [facility]   Past Surgical History  Procedure Laterality Date  . Left modified radical mastectomy/ right total mastectomy  11-13-2007    LEFT BREAST CANCER W/ AXILLARY LYMPH NODE METASTASIS AND POST NEOADJUVANT CHEMO  . Placement port-a-cath  06-22-2007  . Transthoracic echocardiogram  11-18-2011  DR BENSIMHON (ECHO EVERY 3 MONTHS)    HX CHEMO INDUCED CARDIOTOXICITY/  LVSF NORMAL/ EF 55-50%/ MILD MITRIAL VALVE REGURG./ MILDLY INCREASED SYSTOLIC PRESSURE OF PULMONARY ARTERIES  . Knee arthroscopy  02/13/2012   Procedure: ARTHROSCOPY KNEE;  Surgeon: Johnn Hai, MD;  Location: [facility];  Service: Orthopedics;  Laterality: Left;  WITH DEBRIDEMENt   . Knee arthroscopy with lateral menisectomy  02/13/2012    Procedure: KNEE ARTHROSCOPY WITH LATERAL MENISECTOMY;  Surgeon: Johnn Hai, MD;  Location: [facility];  Service: Orthopedics;;  partial  . Total knee arthroplasty Left 09/23/2012    Procedure: LEFT TOTAL KNEE ARTHROPLASTY;  Surgeon: Johnn Hai, MD;  Location: [facility];  Service: Orthopedics;  Laterality: Left;  . Chest wall tumor excision    . Skin graft      chest-post tumor removal    reports that she has never smoked. She has never used smokeless tobacco. She reports that she does not drink alcohol or use illicit drugs. family history includes Alcohol abuse in her paternal grandfather; Cancer in her mother; Heart disease in her mother; Parkinsonism in her father; Stroke in her sister. Allergies  Allergen Reactions  . Tape Hives    Can tolerate paper tape  . Other Rash    STERI STRIPS - Blisters  . Penicillins Rash    Denies airway involvement      Review of Systems  Constitutional: Negative for fever and chills.  Musculoskeletal: Positive for arthralgias.       Objective:   Physical Exam  Constitutional: She appears well-developed and well-nourished.  Cardiovascular: Normal rate and regular rhythm.   Pulmonary/Chest: Effort normal and breath sounds  normal. No respiratory distress. She has no wheezes. She has no rales.  Musculoskeletal:  Left knee scar from prior surgery. She has full range of motion. No erythema or increased warmth. No ecchymosis.          Assessment & Plan:  Persistent left knee pain in a patient with prior knee replacement. No signs of secondary infection. Continue Celebrex and tramadol. We've recommended follow-up with her orthopedist as this has been an ongoing problem

## 2015-01-19 NOTE — Progress Notes (Signed)
Pre visit review using our clinic review tool, if applicable. No additional management support is needed unless otherwise documented below in the visit note. 

## 2015-02-01 ENCOUNTER — Telehealth: Payer: Self-pay | Admitting: Internal Medicine

## 2015-02-01 ENCOUNTER — Emergency Department (HOSPITAL_COMMUNITY): Payer: 59

## 2015-02-01 ENCOUNTER — Encounter (HOSPITAL_COMMUNITY): Payer: Self-pay | Admitting: Emergency Medicine

## 2015-02-01 ENCOUNTER — Observation Stay (HOSPITAL_COMMUNITY)
Admission: EM | Admit: 2015-02-01 | Discharge: 2015-02-03 | Payer: 59 | Attending: Family Medicine | Admitting: Family Medicine

## 2015-02-01 DIAGNOSIS — M1990 Unspecified osteoarthritis, unspecified site: Secondary | ICD-10-CM | POA: Insufficient documentation

## 2015-02-01 DIAGNOSIS — M1711 Unilateral primary osteoarthritis, right knee: Secondary | ICD-10-CM | POA: Diagnosis present

## 2015-02-01 DIAGNOSIS — C50919 Malignant neoplasm of unspecified site of unspecified female breast: Secondary | ICD-10-CM | POA: Diagnosis present

## 2015-02-01 DIAGNOSIS — Z9013 Acquired absence of bilateral breasts and nipples: Secondary | ICD-10-CM | POA: Diagnosis not present

## 2015-02-01 DIAGNOSIS — Z7901 Long term (current) use of anticoagulants: Secondary | ICD-10-CM | POA: Insufficient documentation

## 2015-02-01 DIAGNOSIS — Z79899 Other long term (current) drug therapy: Secondary | ICD-10-CM | POA: Diagnosis not present

## 2015-02-01 DIAGNOSIS — C7989 Secondary malignant neoplasm of other specified sites: Secondary | ICD-10-CM | POA: Diagnosis not present

## 2015-02-01 DIAGNOSIS — Z803 Family history of malignant neoplasm of breast: Secondary | ICD-10-CM | POA: Insufficient documentation

## 2015-02-01 DIAGNOSIS — Z96652 Presence of left artificial knee joint: Secondary | ICD-10-CM | POA: Insufficient documentation

## 2015-02-01 DIAGNOSIS — Z791 Long term (current) use of non-steroidal anti-inflammatories (NSAID): Secondary | ICD-10-CM | POA: Diagnosis not present

## 2015-02-01 DIAGNOSIS — D649 Anemia, unspecified: Secondary | ICD-10-CM | POA: Diagnosis not present

## 2015-02-01 DIAGNOSIS — R509 Fever, unspecified: Principal | ICD-10-CM | POA: Insufficient documentation

## 2015-02-01 DIAGNOSIS — R000 Tachycardia, unspecified: Secondary | ICD-10-CM | POA: Diagnosis not present

## 2015-02-01 DIAGNOSIS — T451X5A Adverse effect of antineoplastic and immunosuppressive drugs, initial encounter: Secondary | ICD-10-CM | POA: Diagnosis present

## 2015-02-01 DIAGNOSIS — Z853 Personal history of malignant neoplasm of breast: Secondary | ICD-10-CM | POA: Diagnosis not present

## 2015-02-01 DIAGNOSIS — G629 Polyneuropathy, unspecified: Secondary | ICD-10-CM | POA: Diagnosis not present

## 2015-02-01 DIAGNOSIS — G620 Drug-induced polyneuropathy: Secondary | ICD-10-CM | POA: Diagnosis present

## 2015-02-01 LAB — CBC WITH DIFFERENTIAL/PLATELET
Basophils Absolute: 0 10*3/uL (ref 0.0–0.1)
Basophils Relative: 0 % (ref 0–1)
EOS PCT: 1 % (ref 0–5)
Eosinophils Absolute: 0.1 10*3/uL (ref 0.0–0.7)
HCT: 32 % — ABNORMAL LOW (ref 36.0–46.0)
Hemoglobin: 10.4 g/dL — ABNORMAL LOW (ref 12.0–15.0)
LYMPHS ABS: 1 10*3/uL (ref 0.7–4.0)
LYMPHS PCT: 15 % (ref 12–46)
MCH: 29.6 pg (ref 26.0–34.0)
MCHC: 32.5 g/dL (ref 30.0–36.0)
MCV: 91.2 fL (ref 78.0–100.0)
MONO ABS: 0.6 10*3/uL (ref 0.1–1.0)
MONOS PCT: 9 % (ref 3–12)
Neutro Abs: 4.8 10*3/uL (ref 1.7–7.7)
Neutrophils Relative %: 75 % (ref 43–77)
PLATELETS: 175 10*3/uL (ref 150–400)
RBC: 3.51 MIL/uL — AB (ref 3.87–5.11)
RDW: 17.3 % — ABNORMAL HIGH (ref 11.5–15.5)
WBC: 6.4 10*3/uL (ref 4.0–10.5)

## 2015-02-01 LAB — URINALYSIS, ROUTINE W REFLEX MICROSCOPIC
Bilirubin Urine: NEGATIVE
Glucose, UA: NEGATIVE mg/dL
Hgb urine dipstick: NEGATIVE
Ketones, ur: NEGATIVE mg/dL
Nitrite: NEGATIVE
Protein, ur: NEGATIVE mg/dL
Specific Gravity, Urine: 1.01 (ref 1.005–1.030)
Urobilinogen, UA: 1 mg/dL (ref 0.0–1.0)
pH: 6.5 (ref 5.0–8.0)

## 2015-02-01 LAB — COMPREHENSIVE METABOLIC PANEL
ALBUMIN: 3.4 g/dL — AB (ref 3.5–5.0)
ALK PHOS: 115 U/L (ref 38–126)
ALT: 35 U/L (ref 14–54)
AST: 74 U/L — ABNORMAL HIGH (ref 15–41)
Anion gap: 11 (ref 5–15)
BILIRUBIN TOTAL: 1.3 mg/dL — AB (ref 0.3–1.2)
BUN: 9 mg/dL (ref 6–20)
CALCIUM: 8.9 mg/dL (ref 8.9–10.3)
CO2: 26 mmol/L (ref 22–32)
CREATININE: 0.57 mg/dL (ref 0.44–1.00)
Chloride: 97 mmol/L — ABNORMAL LOW (ref 101–111)
GFR calc Af Amer: >60 mL/min (ref >60)
GFR calc non Af Amer: >60 mL/min (ref >60)
GLUCOSE: 162 mg/dL — AB (ref 65–99)
Potassium: 3.5 mmol/L (ref 3.5–5.1)
SODIUM: 134 mmol/L — AB (ref 135–145)
TOTAL PROTEIN: 7.3 g/dL (ref 6.5–8.1)

## 2015-02-01 LAB — URINE MICROSCOPIC-ADD ON

## 2015-02-01 LAB — I-STAT CG4 LACTIC ACID, ED
LACTIC ACID, VENOUS: 0.36 mmol/L — AB (ref 0.5–2.0)
Lactic Acid, Venous: 1.93 mmol/L (ref 0.5–2.0)

## 2015-02-01 LAB — MAGNESIUM: Magnesium: 1.8 mg/dL (ref 1.7–2.4)

## 2015-02-01 MED ORDER — GABAPENTIN 300 MG PO CAPS
300.0000 mg | ORAL_CAPSULE | Freq: Three times a day (TID) | ORAL | Status: DC
  Administered 2015-02-01 – 2015-02-03 (×5): 300 mg via ORAL
  Filled 2015-02-01 (×6): qty 1

## 2015-02-01 MED ORDER — ONDANSETRON HCL 4 MG PO TABS: 4.0000 mg | ORAL_TABLET | Freq: Four times a day (QID) | ORAL | Status: DC | PRN

## 2015-02-01 MED ORDER — TRAMADOL HCL 50 MG PO TABS: 50.0000 mg | ORAL_TABLET | Freq: Four times a day (QID) | ORAL | Status: DC | PRN

## 2015-02-01 MED ORDER — SODIUM CHLORIDE 0.9 % IV SOLN
8.0000 mg | Freq: Three times a day (TID) | INTRAVENOUS | Status: DC | PRN
  Filled 2015-02-01: qty 4

## 2015-02-01 MED ORDER — DARIFENACIN HYDROBROMIDE ER 15 MG PO TB24
15.0000 mg | ORAL_TABLET | Freq: Every day | ORAL | Status: DC
  Administered 2015-02-02 – 2015-02-03 (×2): 15 mg via ORAL
  Filled 2015-02-01 (×2): qty 1

## 2015-02-01 MED ORDER — SODIUM CHLORIDE 0.9 % IJ SOLN
3.0000 mL | Freq: Two times a day (BID) | INTRAMUSCULAR | Status: DC
  Administered 2015-02-02: 3 mL via INTRAVENOUS

## 2015-02-01 MED ORDER — POTASSIUM CHLORIDE IN NACL 20-0.9 MEQ/L-% IV SOLN
INTRAVENOUS | Status: AC
  Administered 2015-02-01: via INTRAVENOUS
  Filled 2015-02-01 (×2): qty 1000

## 2015-02-01 MED ORDER — VITAMIN D 1000 UNITS PO TABS
2000.0000 [IU] | ORAL_TABLET | ORAL | Status: DC
  Administered 2015-02-02: 2000 [IU] via ORAL
  Filled 2015-02-01: qty 2

## 2015-02-01 MED ORDER — IBUPROFEN 800 MG PO TABS
800.0000 mg | ORAL_TABLET | Freq: Once | ORAL | Status: AC
  Administered 2015-02-01: 800 mg via ORAL
  Filled 2015-02-01: qty 1

## 2015-02-01 MED ORDER — DOCUSATE SODIUM 100 MG PO CAPS
100.0000 mg | ORAL_CAPSULE | Freq: Two times a day (BID) | ORAL | Status: DC
  Administered 2015-02-01 – 2015-02-03 (×4): 100 mg via ORAL
  Filled 2015-02-01 (×5): qty 1

## 2015-02-01 MED ORDER — CELECOXIB 200 MG PO CAPS
200.0000 mg | ORAL_CAPSULE | Freq: Two times a day (BID) | ORAL | Status: DC
  Administered 2015-02-02 – 2015-02-03 (×3): 200 mg via ORAL
  Filled 2015-02-01 (×3): qty 1

## 2015-02-01 MED ORDER — SODIUM CHLORIDE 0.9 % IV BOLUS (SEPSIS)
1000.0000 mL | Freq: Once | INTRAVENOUS | Status: AC
  Administered 2015-02-01: 1000 mL via INTRAVENOUS

## 2015-02-01 MED ORDER — ENOXAPARIN SODIUM 80 MG/0.8ML ~~LOC~~ SOLN
70.0000 mg | SUBCUTANEOUS | Status: DC
  Administered 2015-02-02 – 2015-02-03 (×2): 70 mg via SUBCUTANEOUS
  Filled 2015-02-01 (×2): qty 0.8

## 2015-02-01 MED ORDER — ZOLPIDEM TARTRATE 5 MG PO TABS
5.0000 mg | ORAL_TABLET | Freq: Every evening | ORAL | Status: DC | PRN
  Administered 2015-02-01 – 2015-02-02 (×2): 5 mg via ORAL
  Filled 2015-02-01 (×2): qty 1

## 2015-02-01 NOTE — Telephone Encounter (Signed)
[facility] [place] - Client [place] [facility] Patient Name: [name] DOB: 01/01/1956 Initial Comment Caller states got flu shot Monday; had lasagna later which upset stomach-just diarrhea, (no longer has it), fever since Tuesday, 101.2 orally; Nurse Assessment Nurse: Mechele Dawley, RN, Amy Date/Time Eilene Ghazi Time): 02/01/2015 2:55:52 PM Confirm and document reason for call. If symptomatic, describe symptoms. ---FLU SHOT ON MONDAY. SHE HAD DIARRHEA THAT EVENING. SHE GOT FEVER OF 101.2 AND FELT ACHY. THIS HAS LASTED UNTIL TODAY. SHE IS HAVING A TUMOR RESECTION NEXT FRIDAY. SHE IS NOT SURE IF SHE CAUSED A UTI. SHE IS NOT HAVING ANY SYMPTOMS OF A UTI. THE AREA WAS RED 4 INCHES BY 5 INCHES WITH THE INJECTION. SHE TOOK SOME BENADRYL FOR IT. Has the patient traveled out of the country within the last 30 days? ---Not Applicable Does the patient require triage? ---Yes Related visit to physician within the last 2 weeks? ---No Does the PT have any chronic conditions? (i.e. diabetes, asthma, etc.) ---Yes List chronic conditions. ---METS BREAST CA Guidelines Guideline Title Affirmed Question Affirmed Notes Immunization Reactions [1] Fever > 100.5 F (38.1 C) AND [2] diabetes mellitus or weak immune system (e.g., HIV positive, cancer chemo, splenectomy, organ transplant, chronic steroids) Final Disposition User Go to ED Now (or PCP triage) Mechele Dawley, RN, Amy Referrals [facility] - ED Disagree/Comply: Comply

## 2015-02-01 NOTE — Telephone Encounter (Signed)
Pt seen in ED on 02/01/15.  Will now close the note.

## 2015-02-01 NOTE — ED Notes (Signed)
EKG given to EDP,Knapp,J., for review. 

## 2015-02-01 NOTE — H&P (Signed)
[facility] History and Physical  TALESHIA LUFF JHE:[id number] DOB: 12/24/1955 DOA: 02/01/2015  Referring physician: Margarita Mail, PA-C PCP: Drema Pry, DO   Chief Complaint: Fever.  HPI: [name] is a 59 y.o. female with below past medical history including breast cancer, status post mastectomy on Kadcyla chemotherapy every 3 weeks at [facility] who comes with complaining of daily fever and chills since Tuesday, which does not have any other specific pattern and is not associated with any other symptoms like headache, earaches, rhinorrhea, sore throat, productive cough, abdominal pain, nausea or emesis, urinary symptoms.   However the patient states that she received the influenza vaccine on Monday, had diarrhea 2 episodes following a meal with [name] that she had on the evening. She also states that she had her teeth cleaned on Tuesday, but she usually does not get a fever following this. She also reports a bug bite about 2 weeks ago, which was treated with doxycycline and except for a very small residual mark near her left knee, she does not report any other symptoms.  She is concerned about this fever because she is scheduled for surgical removal of chest wall tumor on her right side, near the right breast.  Initially the patient was mildly febrile and her temperature was 99.9 degrees Fahrenheit earlier here in the ER. She was also tachycardic with a heart rate of 122 bpm. She is currently afebrile after IV fluids and Motrin was given. Her tachycardia has a heart rate in the low 100s. She is in no acute distress.  Review of Systems:  Constitutional:  Positive Fevers, chills,  No weight loss, night sweats, fatigue.  HEENT:  No headaches, Difficulty swallowing,Tooth/dental problems,Sore throat,  No sneezing, itching, ear ache, nasal congestion, post nasal drip,  Cardio-vascular:  No chest pain, Orthopnea, PND, swelling in lower extremities, anasarca, dizziness,  palpitations  GI:  Positive diarrhea 2 episodes on Tuesday, occasional nausea, No heartburn, indigestion, abdominal pain, vomiting,  change in bowel habits, loss of appetite  Resp:  No shortness of breath with exertion or at rest. No excess mucus, no productive cough, No non-productive cough, No coughing up of blood.No change in color of mucus.No wheezing.No chest wall deformity  Skin:  no rash or lesions.  GU:  no dysuria, change in color of urine, no urgency or frequency.  No flank pain.  Musculoskeletal:  Occasional arthralgias lower extremity joints. No decreased range of motion. No back pain.  Psych:  No change in mood or affect. No depression or anxiety. No memory loss.  Neuro: Peripheral neuropathy of feet and hands.  Past Medical History  Diagnosis Date  . Elevated blood pressure reading without diagnosis of hypertension     PT MONITORS HER B/P AT HOME  . 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 Arthritis KNEES  . 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 Chronic anticoagulation HX  PORT-A-CATH CLOT    2010 - HAS BEEN ON LOVENOX SINCE THE CLOT  . Skin cancer of anterior chest BREAST CANCER PRIMARY W/ METS TO CHEST WALL SKIN CANCER--  CHEMOTHERAPY EVERY 3 WEEKS AT [facility]   Past Surgical History  Procedure Laterality Date  . Left modified radical mastectomy/ right total mastectomy  11-13-2007    LEFT BREAST CANCER W/ AXILLARY LYMPH NODE METASTASIS AND POST NEOADJUVANT  CHEMO  . Placement port-a-cath  06-22-2007  . Transthoracic echocardiogram  11-18-2011  DR BENSIMHON (ECHO EVERY 3 MONTHS)    HX CHEMO INDUCED CARDIOTOXICITY/  LVSF NORMAL/ EF 55-50%/ MILD MITRIAL VALVE REGURG./ MILDLY INCREASED SYSTOLIC PRESSURE OF PULMONARY ARTERIES  . Knee arthroscopy  02/13/2012    Procedure: ARTHROSCOPY KNEE;  Surgeon: Johnn Hai, MD;  Location: [facility];  Service: Orthopedics;  Laterality: Left;  WITH DEBRIDEMENt   . Knee arthroscopy with lateral menisectomy  02/13/2012    Procedure: KNEE ARTHROSCOPY WITH LATERAL MENISECTOMY;  Surgeon: Johnn Hai, MD;  Location: [facility];  Service: Orthopedics;;  partial  . Total knee arthroplasty Left 09/23/2012    Procedure: LEFT TOTAL KNEE ARTHROPLASTY;  Surgeon: Johnn Hai, MD;  Location: [facility];  Service: Orthopedics;  Laterality: Left;  . Chest wall tumor excision    . Skin graft      chest-post tumor removal   Social History:  reports that she has never smoked. She has never used smokeless tobacco. She reports that she does not drink alcohol or use illicit drugs.  Allergies  Allergen Reactions  . Tape Hives    Can tolerate paper tape  . Other Rash    STERI STRIPS - Blisters  . Penicillins Rash    Denies airway involvement    Family History  Problem Relation Age of Onset  . Heart disease Mother     due to mitral valve regurgiation,   . Cancer Mother     breast  . Parkinsonism Father   . Stroke Sister   . Alcohol abuse Paternal Grandfather     Prior to Admission medications   Medication Sig Start Date End Date Taking? Authorizing Provider  Ado-Trastuzumab Emtansine (KADCYLA IV) Inject into the vein. 2.75m/kg/ Chemotherapy drug given every 3 weeks at [facility]   Yes Historical Provider, MD  celecoxib (CELEBREX) 200 MG capsule Take 1 capsule (200 mg total) by mouth 2 (two) times daily. 03/30/14  Yes Doe-Hyun RKyra Searles DO  Cholecalciferol (VITAMIN D3) 2000 UNITS TABS Take 1 tablet by mouth 3 (three) times a week.    Yes Historical Provider, MD  darifenacin (ENABLEX) 15 MG 24 hr tablet Take 15 mg by mouth daily.   Yes Historical Provider, MD  docusate sodium 100 MG CAPS Take 100 mg by mouth 2 (two) times daily. 09/26/12  Yes Matthew Babish, PA-C  enoxaparin (LOVENOX) 80 MG/0.8ML injection Inject 0.7 mLs (70 mg total) into the skin daily. 09/26/12  Yes  Matthew Babish, PA-C  gabapentin (NEURONTIN) 300 MG capsule TAKE 1 CAPSULE BY MOUTH 3 TIMES DAILY. 12/28/14  Yes Doe-Hyun R YShawna Orleans DO  ondansetron (ZOFRAN) 8 MG tablet Take 1 tablet (8 mg total) by mouth every 8 (eight) hours as needed for nausea. 06/19/11  Yes WZenia Resides MD  traMADol (ULTRAM) 50 MG tablet TAKE 1 TABLET BY MOUTH EVERY 6 HOURS AS NEEDED FOR PAIN 01/09/15  Yes Doe-Hyun R YShawna Orleans DO  zolpidem (AMBIEN) 5 MG tablet Take 1 tablet (5 mg total) by mouth at bedtime as needed for sleep. 11/29/14  Yes Doe-Hyun R YShawna Orleans DO  furosemide (LASIX) 20 MG tablet Take 1 tablet (20 mg total) by mouth daily as needed. Patient not taking: Reported on 02/01/2015 07/17/14   DJolaine Artist MD   Physical Exam: Filed Vitals:   02/01/15 1549 02/01/15 1754 02/01/15 1943  BP: 134/64 127/61 128/74  Pulse: 122 99 99  Temp: 99.7 F (37.6 C)  99 F (37.2 C)  TempSrc: Oral  Oral  Resp: _0 SpO2: 96% 96% 99%    Wt Readings from Last 3 Encounters:  01/19/15 74.39 kg (164 lb)  09/08/14 77.111 kg (170 lb)  07/14/14 77.325 kg (170 lb 7.5 oz)    General:  Appears calm and comfortable Eyes: PERRL, normal lids, irises & conjunctiva ENT: grossly normal hearing, lips & tongue Neck: no LAD, masses or thyromegaly Cardiovascular: RRR, no m/r/g. No LE edema. Telemetry: Tachycardic at 106 bpm with occasional PVCs. Respiratory: CTA bilaterally, no w/r/r. Normal respiratory effort. Abdomen: soft, ntnd Skin: Dressing in place on right-sided chest wall. There is no edema, erythema or increased tenderness in the area. Musculoskeletal: grossly normal tone BUE/BLE Psychiatric: grossly normal mood and affect, speech fluent and appropriate Neurologic: grossly non-focal.          Labs on Admission:  Basic Metabolic Panel:  Recent Labs Lab 02/01/15 1619  NA 134*  K 3.5  CL 97*  CO2 26  GLUCOSE 162*  BUN 9  CREATININE 0.57  CALCIUM 8.9   Liver Function Tests:  Recent Labs Lab 02/01/15 1619  AST 74*   ALT 35  ALKPHOS 115  BILITOT 1.3*  PROT 7.3  ALBUMIN 3.4*   No results for input(s): LIPASE, AMYLASE in the last 168 hours. No results for input(s): AMMONIA in the last 168 hours. CBC:  Recent Labs Lab 02/01/15 1619  WBC 6.4  NEUTROABS 4.8  HGB 10.4*  HCT 32.0*  MCV 91.2  PLT 175    Radiological Exams on Admission: Dg Chest 2 View  02/01/2015   CLINICAL DATA:  Fever. Personal history of left-sided breast cancer with nodal metastasis.  EXAM: CHEST  2 VIEW  COMPARISON:  01/11/2014.  FINDINGS: LEFT mastectomy and LEFT axillary dissection. Distortion of the RIGHT breast shadow is present, also likely postsurgical. Surgical clips project over the RIGHT chest. Upward retraction of the LEFT hilum is probably related to scarring. There is no airspace disease. On the lateral projection, there is patchy density in the retrosternal region, suspicious for bronchopneumonia, particularly in a patient with potential for immunosuppression.  The cardiopericardial silhouette is within normal limits. RIGHT IJ Port-A-Cath present.  IMPRESSION: 1. Patchy density in the retrosternal region on lateral projection is suspicious for pneumonia/airspace disease in a patient with fever. By history, patient has potential for immunocompromise and empiric antibiotic treatment is recommended. 2. Extensive postsurgical changes related to breast cancer.   Electronically Signed   By: Dereck Ligas M.D.   On: 02/01/2015 17:05    EkG: Independently reviewed.  Echocardiogram: 07/14/2014  History:  PMH: Tachycardia. Dyspnea.  ------------------------------------------------------------------- Study Conclusions  - Left ventricle: The cavity size was normal. Systolic function was normal. The estimated ejection fraction was in the range of 55% to 60%. Wall motion was normal; there were no regional wall motion abnormalities. Left ventricular diastolic function parameters were normal. - Mitral valve: There  was mild to moderate regurgitation directed centrally.  Impressions:  - Normal global left ventricular longitudinal strain (-23%)  Assessment/Plan Principal Problem:   Fever and chills Admit for observation and telemetry monitoring. Follow-up fever chart. Follow-up CBC in the morning. At this time since there is no obvious source of infection, no elevation of WBC and no other symptoms we will defer the use of IV antibiotics for now.  Active Problems:   Breast cancer, left breast Continue chemotherapy as the scheduled.    Neuropathy due to chemotherapeutic drug  continue Neurontin  and analgesics when necessary.    Sinus tachycardia Improved after rehydration.  Continue telemetry monitoring     Osteoarthritis  continue Celebrex.    Anemia  monitor H&H.    Code Status: Full code. DVT Prophylaxis: On full dose Lovenox SQ. Family Communication: The patient's husband was in the room present with her during interview and physical examination. Mathieson,Greg Spouse   [phone]  Disposition Plan: Admit for further observation and evaluation.  Time spent: Over 70 minutes  Reubin Milan [facility] Pager [phone].

## 2015-02-01 NOTE — ED Notes (Signed)
Pt states that on Monday she got her flu shot and ever since Tuesday pt has been running fever. Pt currently getting chemo and when she spoke to her doctor today was advised to come to ED for evaluation.  Pt had Tylenol at 230pm.

## 2015-02-01 NOTE — ED Notes (Signed)
Spoke with Butch Penny, Agricultural consultant. Pt can be transported to floor @ 2125.

## 2015-02-01 NOTE — ED Provider Notes (Signed)
CSN: [id number]     Arrival date & time 02/01/15  1529 History   First MD Initiated Contact with Patient 02/01/15 1555     Chief Complaint  Patient presents with  . Fever     (Consider location/radiation/quality/duration/timing/severity/associated sxs/prior Treatment) HPI  [name] is a 59 y/o female with a PMH of breast cancer currently undergoing  Chemotherapy.  Pt presents with 3 day history of fever and chills after receiving flu shot Monday. Also reports a welt formed at site of injection, since improved. Diarrhea Monday night, since resolved. Has tried Tylenol for fever with periodic relief, last dose at 2:30 pm. Currently on chemo for breast cancer, advised by her doctor to come to ED. Denies N/V, abdominal pain, cough, SOB, rash, and changes in urinary frequency/urgency/burning with urination. Denies recent travel and sick contacts. She has an open tumor on the Right breast . She is shceduled for a resection at [facility] next week and a Tran-flap. Patient recently completed a course of Doxy for SSTI. She denies any changes in the tumor or signs of infection.  Past Medical History  Diagnosis Date  . Elevated blood pressure reading without diagnosis of hypertension     PT MONITORS HER B/P AT HOME  . 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 Arthritis KNEES  . 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 Chronic anticoagulation HX  PORT-A-CATH CLOT    2010 - HAS BEEN ON LOVENOX SINCE THE CLOT  . Skin cancer of anterior chest BREAST CANCER PRIMARY W/ METS TO CHEST WALL SKIN CANCER--  CHEMOTHERAPY EVERY 3 WEEKS AT [facility]   Past Surgical History  Procedure Laterality Date  . Left modified radical mastectomy/ right total mastectomy  11-13-2007    LEFT BREAST CANCER W/ AXILLARY LYMPH NODE METASTASIS AND POST NEOADJUVANT CHEMO  . Placement port-a-cath   06-22-2007  . Transthoracic echocardiogram  11-18-2011  DR BENSIMHON (ECHO EVERY 3 MONTHS)    HX CHEMO INDUCED CARDIOTOXICITY/  LVSF NORMAL/ EF 55-50%/ MILD MITRIAL VALVE REGURG./ MILDLY INCREASED SYSTOLIC PRESSURE OF PULMONARY ARTERIES  . Knee arthroscopy  02/13/2012    Procedure: ARTHROSCOPY KNEE;  Surgeon: Johnn Hai, MD;  Location: [facility];  Service: Orthopedics;  Laterality: Left;  WITH DEBRIDEMENt   . Knee arthroscopy with lateral menisectomy  02/13/2012    Procedure: KNEE ARTHROSCOPY WITH LATERAL MENISECTOMY;  Surgeon: Johnn Hai, MD;  Location: [facility];  Service: Orthopedics;;  partial  . Total knee arthroplasty Left 09/23/2012    Procedure: LEFT TOTAL KNEE ARTHROPLASTY;  Surgeon: Johnn Hai, MD;  Location: [facility];  Service: Orthopedics;  Laterality: Left;  . Chest wall tumor excision    . Skin graft      chest-post tumor removal   Family History  Problem Relation Age of Onset  . Heart disease Mother     due to mitral valve regurgiation,   . Cancer Mother     breast  . Parkinsonism Father   . Stroke Sister   . Alcohol abuse Paternal Grandfather    Social History  Substance Use Topics  . Smoking status: Never Smoker   . Smokeless tobacco: Never Used  . Alcohol Use: No   OB History    No data available     Review of Systems  Ten systems  reviewed and are negative for acute change, except as noted in the HPI.    Allergies  Tape; Other; and Penicillins  Home Medications   Prior to Admission medications   Medication Sig Start Date End Date Taking? Authorizing Provider  Ado-Trastuzumab Emtansine (KADCYLA IV) Inject into the vein. 2.$RemoveBef'5mg'lCAVWPhmhN$ /kg/ Chemotherapy drug given every 3 weeks at [place].   Yes Historical Provider, MD  celecoxib (CELEBREX) 200 MG capsule Take 1 capsule (200 mg total) by mouth 2 (two) times daily. 03/30/14  Yes Doe-Hyun Kyra Searles, DO  Cholecalciferol (VITAMIN D3) 2000 UNITS TABS Take 1 tablet by mouth 3  (three) times a week.    Yes Historical Provider, MD  darifenacin (ENABLEX) 15 MG 24 hr tablet Take 15 mg by mouth daily.   Yes Historical Provider, MD  docusate sodium 100 MG CAPS Take 100 mg by mouth 2 (two) times daily. 09/26/12  Yes Matthew Babish, PA-C  enoxaparin (LOVENOX) 80 MG/0.8ML injection Inject 0.7 mLs (70 mg total) into the skin daily. 09/26/12  Yes Matthew Babish, PA-C  gabapentin (NEURONTIN) 300 MG capsule TAKE 1 CAPSULE BY MOUTH 3 TIMES DAILY. 12/28/14  Yes Doe-Hyun R Shawna Orleans, DO  ondansetron (ZOFRAN) 8 MG tablet Take 1 tablet (8 mg total) by mouth every 8 (eight) hours as needed for nausea. 06/19/11  Yes Zenia Resides, MD  traMADol (ULTRAM) 50 MG tablet TAKE 1 TABLET BY MOUTH EVERY 6 HOURS AS NEEDED FOR PAIN 01/09/15  Yes Doe-Hyun R Shawna Orleans, DO  zolpidem (AMBIEN) 5 MG tablet Take 1 tablet (5 mg total) by mouth at bedtime as needed for sleep. 11/29/14  Yes Doe-Hyun R Shawna Orleans, DO  furosemide (LASIX) 20 MG tablet Take 1 tablet (20 mg total) by mouth daily as needed. Patient not taking: Reported on 02/01/2015 07/17/14   Shaune Pascal Bensimhon, MD   BP 134/64 mmHg  Pulse 122  Temp(Src) 99.7 F (37.6 C) (Oral)  Resp 18  SpO2 96%  LMP 06/22/2007 Physical Exam  Constitutional: She is oriented to person, place, and time. She appears well-developed and well-nourished. No distress.  HENT:  Head: Normocephalic and atraumatic.  Eyes: Conjunctivae are normal. No scleral icterus.  Neck: Normal range of motion.  Cardiovascular: Normal rate, regular rhythm and normal heart sounds.  Exam reveals no gallop and no friction rub.   No murmur heard. Pulmonary/Chest: Effort normal and breath sounds normal. No respiratory distress.  S/p Left mastectomy and Right breast revision surgery.   Abdominal: Soft. Bowel sounds are normal. She exhibits no distension and no mass. There is no tenderness. There is no guarding.  Neurological: She is alert and oriented to person, place, and time.  Skin: Skin is warm and dry. She is  not diaphoretic.    ED Course  Procedures (including critical care time) Labs Review Labs Reviewed  CULTURE, BLOOD (ROUTINE X 2)  CULTURE, BLOOD (ROUTINE X 2)  URINE CULTURE  COMPREHENSIVE METABOLIC PANEL  URINALYSIS, ROUTINE W REFLEX MICROSCOPIC (NOT AT [facility])  CBC WITH DIFFERENTIAL/PLATELET  I-STAT CG4 LACTIC ACID, ED    Imaging Review No results found. I have personally reviewed and evaluated these images and lab results as part of my medical decision-making.   EKG Interpretation None      MDM   Final diagnoses:  Fever of unknown origin    6:24 PM Filed Vitals:   02/01/15 1549 02/01/15 1754  BP: 134/64 127/61  Pulse: 122 99  Temp: 99.7 F (37.6 C)   TempSrc: Oral   Resp: 18 16  SpO2: 96% 96%   Patient with fever of unknown origin.  UA is negative.  I have reviewed the CXR findings. The patient had a CT of the chest on 01/19/2015 which showed subq  Gas in the Large R breast tumor . I believe this is what is seen on the Lateral CXR. I have discussed this with Dr. Quintella Reichert in Radiology who agrees that this may be what is seen on the [place]. Given the fact that she has no cough or respiratory sxs, normal RR and 02 sats, I doubt developing pneumonia.  I took the patient's oral temp earlier and it was 100.0 F. Patient treated with 800 Ibuprofen. I have discussed the case with the attending physician should begin broad-spectrum antibiotics.  Place a consult for admission with [facility].  Patient accepted for OBS admission. FUO/ tachycardia. Appears safe for admission and agrees with [place], PA-C 02/01/15 [place], DO 02/04/15 1107

## 2015-02-02 DIAGNOSIS — C50912 Malignant neoplasm of unspecified site of left female breast: Secondary | ICD-10-CM | POA: Diagnosis not present

## 2015-02-02 DIAGNOSIS — R509 Fever, unspecified: Secondary | ICD-10-CM

## 2015-02-02 DIAGNOSIS — I471 Supraventricular tachycardia: Secondary | ICD-10-CM

## 2015-02-02 DIAGNOSIS — C50919 Malignant neoplasm of unspecified site of unspecified female breast: Secondary | ICD-10-CM | POA: Diagnosis not present

## 2015-02-02 DIAGNOSIS — G620 Drug-induced polyneuropathy: Secondary | ICD-10-CM

## 2015-02-02 DIAGNOSIS — C7989 Secondary malignant neoplasm of other specified sites: Secondary | ICD-10-CM | POA: Diagnosis not present

## 2015-02-02 DIAGNOSIS — T451X5A Adverse effect of antineoplastic and immunosuppressive drugs, initial encounter: Secondary | ICD-10-CM

## 2015-02-02 LAB — URINE CULTURE: Culture: NO GROWTH

## 2015-02-02 LAB — COMPREHENSIVE METABOLIC PANEL
ALT: 35 U/L (ref 14–54)
AST: 66 U/L — AB (ref 15–41)
Albumin: 3.2 g/dL — ABNORMAL LOW (ref 3.5–5.0)
Alkaline Phosphatase: 115 U/L (ref 38–126)
Anion gap: 7 (ref 5–15)
BUN: 7 mg/dL (ref 6–20)
CHLORIDE: 103 mmol/L (ref 101–111)
CO2: 26 mmol/L (ref 22–32)
Calcium: 8.7 mg/dL — ABNORMAL LOW (ref 8.9–10.3)
Creatinine, Ser: 0.44 mg/dL (ref 0.44–1.00)
Glucose, Bld: 105 mg/dL — ABNORMAL HIGH (ref 65–99)
POTASSIUM: 3.8 mmol/L (ref 3.5–5.1)
Sodium: 136 mmol/L (ref 135–145)
Total Bilirubin: 1.1 mg/dL (ref 0.3–1.2)
Total Protein: 7.5 g/dL (ref 6.5–8.1)

## 2015-02-02 LAB — CBC
HCT: 31.1 % — ABNORMAL LOW (ref 36.0–46.0)
Hemoglobin: 10.2 g/dL — ABNORMAL LOW (ref 12.0–15.0)
MCH: 29.9 pg (ref 26.0–34.0)
MCHC: 32.8 g/dL (ref 30.0–36.0)
MCV: 91.2 fL (ref 78.0–100.0)
PLATELETS: 205 10*3/uL (ref 150–400)
RBC: 3.41 MIL/uL — ABNORMAL LOW (ref 3.87–5.11)
RDW: 17.5 % — AB (ref 11.5–15.5)
WBC: 6.2 10*3/uL (ref 4.0–10.5)

## 2015-02-02 MED ORDER — SODIUM CHLORIDE 0.9 % IJ SOLN
10.0000 mL | INTRAMUSCULAR | Status: DC | PRN
  Administered 2015-02-02 – 2015-02-03 (×3): 10 mL
  Filled 2015-02-02 (×3): qty 40

## 2015-02-02 MED ORDER — ACETAMINOPHEN 325 MG PO TABS
650.0000 mg | ORAL_TABLET | ORAL | Status: DC | PRN
  Administered 2015-02-02 (×2): 650 mg via ORAL
  Filled 2015-02-02 (×2): qty 2

## 2015-02-02 NOTE — Progress Notes (Signed)
[facility] PROGRESS NOTE  [name] VHQ:[id number] DOB: October 25, 1955 DOA: 02/01/2015 PCP: Laura Pry, DO  Assessment/Plan: 1. Fever- no signs or symptoms of infection.  WBC normal.  UA is clear, chest x-ray negative.  Likely viral.  Will consult ID for further evaluation and recommendations. 2. Breast cancer, left breast-  Chemotherapy as outpatient. 3. 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 4. DVT prophylaxis- Lovenox.   Code Status: Full code Family Communication: No family at  bedside Disposition Plan: Home when medically stable   Consultants:  None  Procedures:  None  Antibiotics:  None  HPI/Subjective: 59 y.o. female with below past medical history including breast cancer, status post mastectomy on Kadcyla chemotherapy every 3 weeks at [facility] who comes with complaining of daily fever and chills since Tuesday, which does not have any other specific pattern and is not associated with any other symptoms like headache, earaches, rhinorrhea, sore throat, productive cough, abdominal pain, nausea oremesis, urinary symptoms.  However the patient states that she received the influenza vaccine on Monday, had diarrhea 2 episodes following a meal with [name] that she had on the evening. She also states that she had her teeth cleaned on Tuesday, but she usually does not get a fever following this. She also reports a bug bite about 2 weeks ago, which was treated with doxycycline and except for a very small residual mark near her left knee, she does not report any other symptoms  This morning patient feels fine, did have temperature of 102.9 last night.  Objective: Filed Vitals:   02/02/15 1415  BP: 120/63  Pulse: 104  Temp: 99.1 F (37.3 C)  Resp: 20    Intake/Output Summary (Last 24 hours) at 02/02/15 1440 Last data filed at 02/02/15 1000  Gross per 24 hour  Intake 986.67 ml  Output      0 ml  Net 986.67 ml   Filed Weights    02/02/15 0900  Weight: 70.761 kg (156 lb)    Exam:   General:  Appears in no acute distress  Cardiovascular: S1-S2 regular  Respiratory: Clear to auscultation bilaterally  Abdomen: Soft, nontender, no organomegaly  Musculoskeletal: No edema the lower extremities, no cyanosis, no clubbing.  Data Reviewed: Basic Metabolic Panel:  Recent Labs Lab 02/01/15 1619 02/01/15 2032 02/02/15 0610  NA 134*  --  136  K 3.5  --  3.8  CL 97*  --  103  CO2 26  --  26  GLUCOSE 162*  --  105*  BUN 9  --  7  CREATININE 0.57  --  0.44  CALCIUM 8.9  --  8.7*  MG  --  1.8  --    Liver Function Tests:  Recent Labs Lab 02/01/15 1619 02/02/15 0610  AST 74* 66*  ALT 35 35  ALKPHOS 115 115  BILITOT 1.3* 1.1  PROT 7.3 7.5  ALBUMIN 3.4* 3.2*   No results for input(s): LIPASE, AMYLASE in the last 168 hours. No results for input(s): AMMONIA in the last 168 hours. CBC:  Recent Labs Lab 02/01/15 1619 02/02/15 0610  WBC 6.4 6.2  NEUTROABS 4.8  --   HGB 10.4* 10.2*  HCT 32.0* 31.1*  MCV 91.2 91.2  PLT 175 205   CBG: No results for input(s): GLUCAP in the last 168 hours.  Recent Results (from the past 240 hour(s))  Culture, blood (routine x 2)     Status: None (Preliminary result)   Collection Time: 02/01/15  4:10 PM  Result Value Ref Range Status   Specimen Description BLOOD RIGHT ANTECUBITAL  Final   Special Requests BOTTLES DRAWN AEROBIC AND ANAEROBIC 5CC  Final   Culture   Final    NO GROWTH < 24 HOURS Performed at [facility]    Report Status PENDING  Incomplete  Culture, blood (routine x 2)     Status: None (Preliminary result)   Collection Time: 02/01/15  4:15 PM  Result Value Ref Range Status   Specimen Description BLOOD RIGHT HAND  Final   Special Requests BOTTLES DRAWN AEROBIC AND ANAEROBIC 5CC  Final   Culture   Final    NO GROWTH < 24 HOURS Performed at [facility]    Report Status PENDING  Incomplete     Studies: Dg Chest 2  View  02/01/2015   CLINICAL DATA:  Fever. Personal history of left-sided breast cancer with nodal metastasis.  EXAM: CHEST  2 VIEW  COMPARISON:  01/11/2014.  FINDINGS: LEFT mastectomy and LEFT axillary dissection. Distortion of the RIGHT breast shadow is present, also likely postsurgical. Surgical clips project over the RIGHT chest. Upward retraction of the LEFT hilum is probably related to scarring. There is no airspace disease. On the lateral projection, there is patchy density in the retrosternal region, suspicious for bronchopneumonia, particularly in a patient with potential for immunosuppression.  The cardiopericardial silhouette is within normal limits. RIGHT IJ Port-A-Cath present.  IMPRESSION: 1. Patchy density in the retrosternal region on lateral projection is suspicious for pneumonia/airspace disease in a patient with fever. By history, patient has potential for immunocompromise and empiric antibiotic treatment is recommended. 2. Extensive postsurgical changes related to breast cancer.   Electronically Signed   By: [name] M.D.   On: 02/01/2015 17:05    Scheduled Meds: . celecoxib  200 mg Oral BID  . cholecalciferol  2,000 Units Oral Once per day on Mon Wed Fri  . darifenacin  15 mg Oral Daily  . docusate sodium  100 mg Oral BID  . enoxaparin  70 mg Subcutaneous Q24H  . gabapentin  300 mg Oral TID  . sodium chloride  3 mL Intravenous Q12H   Continuous Infusions: . 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 If 7PM-7AM, please contact night-coverage at [email or web address], password [facility] 02/02/2015, 2:40 PM

## 2015-02-02 NOTE — Care Management Note (Signed)
Case Management Note  Patient Details  Name: AVONLEA SIMA MRN: [id number] Date of Birth: 02/29/1956  Subjective/Objective: 59 y/o f admitted w/fever. From home.                   Action/Plan:d/c plan home.   Expected Discharge Date:                  Expected Discharge Plan:  Home/Self Care  In-House Referral:     Discharge planning Services  CM Consult  Post Acute Care Choice:    Choice offered to:     DME Arranged:    DME Agency:     HH Arranged:    HH Agency:     Status of Service:  In process, will continue to follow  Medicare Important Message Given:    Date Medicare IM Given:    Medicare IM give by:    Date Additional Medicare IM Given:    Additional Medicare Important Message give by:     If discussed at [place] of Stay Meetings, dates discussed:    Additional Comments:  Dessa Phi, RN 02/02/2015, 3:36 PM

## 2015-02-02 NOTE — Progress Notes (Signed)
Patient ID: [name], female   DOB: Nov 07, 1955, 59 y.o.   MRN: [id number]         [facility]    Date of Admission:  02/01/2015          Reason for Consult: Unexplained fever  4 days    Referring Physician: Dr. 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 celecoxib  200 mg Oral BID  . cholecalciferol  2,000 Units Oral Once per day on Mon Wed Fri  . darifenacin  15 mg Oral Daily  . docusate sodium  100 mg Oral BID  . enoxaparin  70 mg Subcutaneous Q24H  . gabapentin  300 mg Oral TID  . sodium chloride  3 mL Intravenous Q12H    Recommendations: 1. Continue observation off of antibiotics for now 2. Await results of blood cultures  3. If she has another fever spike I would repeat 2 sets of blood cultures according to the IDSA guideline no dictation below:  "15. For suspected CRBSI, paired blood samples, drawn from the catheter and a peripheral vein, should be cultured  before initiation of antimicrobial therapy, and the bottles should be appropriately marked to reflect the site from which the  samples were obtained (A-II)."   Assessment: The source for her recent fevers is unclear. The radiologist questioned a small retrosternal infiltrate but she has no clinical findings to support a diagnosis of pneumonia. I do not think she has any infection of her chest wall at the site of her metastases. Her Port-A-Cath looks okay and so far blood cultures are negative. She has had no diarrhea or symptoms to suggest UTI and the lesion on her left calf has resolved. Ado-trastuzumab is known to cause febrile reactions so this is a possible culprit. She is afebrile and looks perfectly stable and nontoxic now also I would continue observation off of antibiotics pending further observation  and results of blood cultures. Dr. Johnnye Sima will follow-up this weekend.   HPI: [name] is a 59 y.o. female who has an extensive history of metastatic breast cancer followed by Dr. Gypsy Balsam at [facility]. A summary of her history extracted from Dr. Melvenia Beam last office visit note is attached below.  "1. 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 a. Status post initial report of swelling and erythema 04/2007. b. Biopsy revealing invasive ductal carcinoma ER negative, PR negative, HER2 3+ in 05/2007. c. Status post six cycles of [place] chemotherapy 05/2007 - 09/2007 with a moderate response. d. Status post bilateral mastectomy 11/13/07 revealing less than 1 cm area of involvement with scattered tumor cells, 4/11 lymph nodes involved with metastatic disease. e. Status post concurrent Herceptin and chest wall radiation 11/2007 - 01/2008. f. Rash consistent with recurrent disease 03/2008. g. PET/CT 05/30/08 showing no evidence of metastatic disease. h. Initiation of lapatinib plus or minus pazopanib on clinical trial, 06/28/08. i. Progression on lapatinib plus or minus pazopanib clinical trial 11/01/08. j. Initiation of Tykerb/Herceptin therapy 11/01/08. k. Initiation of Xeloda in combination with lapatinib and Herceptin therapy 11/08/08. l. Radiation therapy to right chest wall 03/2009 - 05/2009 while on systemic therapy. m. Avastin initiated 04/15/10 for progressive chest wall disease. n. Xeloda, Avastin, Herceptin, and  Tykerb discontinued prior to starting the phase I vaccine trial. She last had Xeloda 01/20/11? last Avastin was 01/28/11 and last Tykerb and Herceptin was 02/03/11. o. Consented to the Phase I vaccine trial 02/03/11 requiring a 3 week washout. p. Initiation of Eribulin in combination with Herceptin and lapatinib chemotherapy 04/15/2011. q. Initiation of TDM1 single agent therapy 09/08/2011. r. Resection  of isolated chest wall disease 06/2013.  [name] has been receiving chemotherapy with ado-trastuzumab every 3 weeks for the past 3 years and is scheduled to undergo surgical resection of a chest wall metastasis in the next few weeks. 2 weeks ago she noted a small "insect bite" causing a slightly raised red area on her left calf. She was treated with doxycycline for 1 week and it went away promptly. 4 days ago she received her flu shot. She had the expected mild swelling and tenderness on her right arm where the shot was given. That went away within 24 hours but she started having fever and chills. They have continued to occur intermittently since then leading to admission yesterday. She states that she feels completely normal and she is not febrile. She denies headache, sinus congestion, sore throat, cough, shortness of breath, nausea, vomiting, diarrhea, dysuria, rash and itching.  She states that she wonders if her right breast could be infected. However, she has not noted any increased bleeding or other drainage recently. There is no odor and she has had no increase in swelling or pain. She's not had any problems with her Port-A-Cath which is been in place for about 2 years. She does note some flushing over her chest each time she receives her ado-trastuzumab. That usually resolves within 24 hours of her infusion. She has never had fever during those reactions. Blood cultures done yesterday on admission are negative so far.  Review of Systems: Constitutional: positive for chills and fevers, negative for anorexia, sweats and weight loss Eyes: negative Ears, nose, mouth, throat, and face: negative Respiratory: negative Cardiovascular: negative Gastrointestinal: negative Genitourinary:negative Integument/breast: positive for bleeding from her right chest wall mass  Past Medical History  Diagnosis Date  . Elevated blood pressure reading without diagnosis of hypertension     PT MONITORS HER B/P AT  HOME  . 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 Arthritis KNEES  . 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 Chronic anticoagulation HX  PORT-A-CATH CLOT    2010 - HAS BEEN ON LOVENOX SINCE THE CLOT  . Skin cancer of anterior chest BREAST CANCER PRIMARY W/ METS TO CHEST WALL SKIN CANCER--  CHEMOTHERAPY EVERY 3 WEEKS AT [facility]    Social History  Substance Use Topics  . Smoking status: Never Smoker   . Smokeless tobacco: Never Used  . Alcohol Use: No    Family History  Problem Relation Age of Onset  . Heart disease Mother     due to mitral valve regurgiation,   . Cancer Mother     breast  . Parkinsonism Father   . Stroke Sister   . Alcohol abuse Paternal Grandfather    Allergies  Allergen Reactions  . Tape Hives    Can tolerate paper tape  . Other Rash    STERI STRIPS - Blisters  . Penicillins Rash    Denies airway involvement    OBJECTIVE: Blood pressure 120/63, pulse 104,  temperature 99.1 F (37.3 C), temperature source Oral, resp. rate 20, height 5' 7.5" (1.715 m), weight 156 lb (70.761 kg), last menstrual period 06/22/2007, SpO2 99 %. General: She is alert and in no distress visiting with her daughter Skin: She has some alopecia. No rash. The erythematous area on her left calf has resolved Eyes: Normal external exam Oral: No oropharyngeal lesions Lungs: Clear Cor: Tachycardic but regular S1 and S2 with an early 1/6 systolic murmur Chest: Her left anterior chest Port-A-Cath site appears normal. She's had previous bilateral mastectomies. She has a firm, golf ball size mass on the right anterior chest wall which is diffusely oozing blood. There is no surrounding fluctuance, erythema or warmth. There is no unusual odor Abdomen: Soft and nontender Joints and extremities: Normal  Lab Results Lab Results  Component Value Date   WBC 6.2  02/02/2015   HGB 10.2* 02/02/2015   HCT 31.1* 02/02/2015   MCV 91.2 02/02/2015   PLT 205 02/02/2015    Lab Results  Component Value Date   CREATININE 0.44 02/02/2015   BUN 7 02/02/2015   NA 136 02/02/2015   K 3.8 02/02/2015   CL 103 02/02/2015   CO2 26 02/02/2015    Lab Results  Component Value Date   ALT 35 02/02/2015   AST 66* 02/02/2015   ALKPHOS 115 02/02/2015   BILITOT 1.1 02/02/2015     Microbiology: Recent Results (from the past 240 hour(s))  Culture, blood (routine x 2)     Status: None (Preliminary result)   Collection Time: 02/01/15  4:10 PM  Result Value Ref Range Status   Specimen Description BLOOD RIGHT ANTECUBITAL  Final   Special Requests BOTTLES DRAWN AEROBIC AND ANAEROBIC 5CC  Final   Culture   Final    NO GROWTH < 24 HOURS Performed at [facility]    Report Status PENDING  Incomplete  Culture, blood (routine x 2)     Status: None (Preliminary result)   Collection Time: 02/01/15  4:15 PM  Result Value Ref Range Status   Specimen Description BLOOD RIGHT HAND  Final   Special Requests BOTTLES DRAWN AEROBIC AND ANAEROBIC 5CC  Final   Culture   Final    NO GROWTH < 24 HOURS Performed at [facility]    Report Status PENDING  Incomplete    Michel Bickers, MD [place] [facility] [place] [facility] [phone] pager   [phone] cell 02/02/2015, 2:23 PM

## 2015-02-03 DIAGNOSIS — Z853 Personal history of malignant neoplasm of breast: Secondary | ICD-10-CM | POA: Diagnosis not present

## 2015-02-03 DIAGNOSIS — M1990 Unspecified osteoarthritis, unspecified site: Secondary | ICD-10-CM | POA: Diagnosis not present

## 2015-02-03 DIAGNOSIS — Z803 Family history of malignant neoplasm of breast: Secondary | ICD-10-CM | POA: Diagnosis not present

## 2015-02-03 DIAGNOSIS — C7989 Secondary malignant neoplasm of other specified sites: Secondary | ICD-10-CM | POA: Diagnosis not present

## 2015-02-03 DIAGNOSIS — R509 Fever, unspecified: Secondary | ICD-10-CM | POA: Diagnosis present

## 2015-02-03 DIAGNOSIS — D649 Anemia, unspecified: Secondary | ICD-10-CM | POA: Diagnosis not present

## 2015-02-03 DIAGNOSIS — Z7901 Long term (current) use of anticoagulants: Secondary | ICD-10-CM | POA: Diagnosis not present

## 2015-02-03 DIAGNOSIS — Z96652 Presence of left artificial knee joint: Secondary | ICD-10-CM | POA: Diagnosis not present

## 2015-02-03 DIAGNOSIS — Z9013 Acquired absence of bilateral breasts and nipples: Secondary | ICD-10-CM | POA: Diagnosis not present

## 2015-02-03 DIAGNOSIS — G629 Polyneuropathy, unspecified: Secondary | ICD-10-CM | POA: Diagnosis not present

## 2015-02-03 DIAGNOSIS — Z79899 Other long term (current) drug therapy: Secondary | ICD-10-CM | POA: Diagnosis not present

## 2015-02-03 DIAGNOSIS — R000 Tachycardia, unspecified: Secondary | ICD-10-CM | POA: Diagnosis not present

## 2015-02-03 DIAGNOSIS — C50912 Malignant neoplasm of unspecified site of left female breast: Secondary | ICD-10-CM | POA: Diagnosis not present

## 2015-02-03 DIAGNOSIS — Z791 Long term (current) use of non-steroidal anti-inflammatories (NSAID): Secondary | ICD-10-CM | POA: Diagnosis not present

## 2015-02-03 MED ORDER — VANCOMYCIN HCL IN DEXTROSE 750-5 MG/150ML-% IV SOLN
750.0000 mg | Freq: Two times a day (BID) | INTRAVENOUS | Status: DC
  Administered 2015-02-03: 750 mg via INTRAVENOUS
  Filled 2015-02-03: qty 150

## 2015-02-03 MED ORDER — DEXTROSE 5 % IV SOLN
1.0000 g | Freq: Three times a day (TID) | INTRAVENOUS | Status: DC
  Administered 2015-02-03: 1 g via INTRAVENOUS
  Filled 2015-02-03 (×2): qty 1

## 2015-02-03 NOTE — Progress Notes (Signed)
[facility] PROGRESS NOTE  [name] VZD:[id number] DOB: [birth year] DOA: 02/01/2015 PCP: Laura Pry, DO  Assessment/Plan: 1. Fever- no signs or symptoms of infection.  WBC normal.  UA is clear, chest x-ray negative.  Likely viral.  ID recommends to follow the blood cultures. 2. Breast cancer, left breast-  Chemotherapy as outpatient. 3. 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 4. DVT prophylaxis- Lovenox.   Code Status: Full code Family Communication: No family at  bedside Disposition Plan: Home when medically stable   Consultants:  None  Procedures:  None  Antibiotics:  None  HPI/Subjective: 59 y.o. female with below past medical history including breast cancer, status post mastectomy on Kadcyla chemotherapy every 3 weeks at [name] who comes with complaining of daily fever and chills since Tuesday, which does not have any other specific pattern and is not associated with any other symptoms like headache, earaches, rhinorrhea, sore throat, productive cough, abdominal pain, nausea oremesis, urinary symptoms.  However the patient states that she received the influenza vaccine on Monday, had diarrhea 2 episodes following a meal with [name] that she had on the evening. She also states that she had her teeth cleaned on Tuesday, but she usually does not get a fever following this. She also reports a bug bite about 2 weeks ago, which was treated with doxycycline and except for a very small residual mark near her left knee, she does not report any other symptoms  This morning patient denies any complaints, again was febrile last night with temp of 102.9  Objective: Filed Vitals:   02/03/15 0508  BP: 120/64  Pulse: 97  Temp: 98.8 F (37.1 C)  Resp: 18    Intake/Output Summary (Last 24 hours) at 02/03/15 1112 Last data filed at 02/03/15 2951  Gross per 24 hour  Intake    130 ml  Output      0 ml  Net    130 ml   Filed Weights    02/02/15 0900  Weight: 70.761 kg (156 lb)    Exam:   General:  Appears in no acute distress  Cardiovascular: S1-S2 regular  Respiratory: Clear to auscultation bilaterally  Abdomen: Soft, nontender, no organomegaly  Musculoskeletal: No edema the lower extremities, no cyanosis, no clubbing.  Data Reviewed: Basic Metabolic Panel:  Recent Labs Lab 02/01/15 1619 02/01/15 2032 02/02/15 0610  NA 134*  --  136  K 3.5  --  3.8  CL 97*  --  103  CO2 26  --  26  GLUCOSE 162*  --  105*  BUN 9  --  7  CREATININE 0.57  --  0.44  CALCIUM 8.9  --  8.7*  MG  --  1.8  --    Liver Function Tests:  Recent Labs Lab 02/01/15 1619 02/02/15 0610  AST 74* 66*  ALT 35 35  ALKPHOS 115 115  BILITOT 1.3* 1.1  PROT 7.3 7.5  ALBUMIN 3.4* 3.2*   No results for input(s): LIPASE, AMYLASE in the last 168 hours. No results for input(s): AMMONIA in the last 168 hours. CBC:  Recent Labs Lab 02/01/15 1619 02/02/15 0610  WBC 6.4 6.2  NEUTROABS 4.8  --   HGB 10.4* 10.2*  HCT 32.0* 31.1*  MCV 91.2 91.2  PLT 175 205   CBG: No results for input(s): GLUCAP in the last 168 hours.  Recent Results (from the past 240 hour(s))  Culture, blood (routine x 2)     Status: None (Preliminary result)  Collection Time: 02/01/15  4:10 PM  Result Value Ref Range Status   Specimen Description BLOOD RIGHT ANTECUBITAL  Final   Special Requests BOTTLES DRAWN AEROBIC AND ANAEROBIC 5CC  Final   Culture   Final    NO GROWTH 2 DAYS Performed at [facility]    Report Status PENDING  Incomplete  Culture, blood (routine x 2)     Status: None (Preliminary result)   Collection Time: 02/01/15  4:15 PM  Result Value Ref Range Status   Specimen Description BLOOD RIGHT HAND  Final   Special Requests BOTTLES DRAWN AEROBIC AND ANAEROBIC 5CC  Final   Culture   Final    NO GROWTH 2 DAYS Performed at [facility]    Report Status PENDING  Incomplete  Urine culture     Status: None   Collection  Time: 02/01/15  4:54 PM  Result Value Ref Range Status   Specimen Description URINE, CLEAN CATCH  Final   Special Requests NONE  Final   Culture   Final    NO GROWTH 1 DAY Performed at [facility]    Report Status 02/02/2015 FINAL  Final     Studies: Dg Chest 2 View  02/01/2015   CLINICAL DATA:  Fever. Personal history of left-sided breast cancer with nodal metastasis.  EXAM: CHEST  2 VIEW  COMPARISON:  01/11/2014.  FINDINGS: LEFT mastectomy and LEFT axillary dissection. Distortion of the RIGHT breast shadow is present, also likely postsurgical. Surgical clips project over the RIGHT chest. Upward retraction of the LEFT hilum is probably related to scarring. There is no airspace disease. On the lateral projection, there is patchy density in the retrosternal region, suspicious for bronchopneumonia, particularly in a patient with potential for immunosuppression.  The cardiopericardial silhouette is within normal limits. RIGHT IJ Port-A-Cath present.  IMPRESSION: 1. Patchy density in the retrosternal region on lateral projection is suspicious for pneumonia/airspace disease in a patient with fever. By history, patient has potential for immunocompromise and empiric antibiotic treatment is recommended. 2. Extensive postsurgical changes related to breast cancer.   Electronically Signed   By: [name] M.D.   On: 02/01/2015 17:05    Scheduled Meds: . celecoxib  200 mg Oral BID  . cholecalciferol  2,000 Units Oral Once per day on Mon Wed Fri  . darifenacin  15 mg Oral Daily  . docusate sodium  100 mg Oral BID  . enoxaparin  70 mg Subcutaneous Q24H  . gabapentin  300 mg Oral TID  . 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 If 7PM-7AM, please contact night-coverage at [email or web address], password [facility] 02/03/2015, 11:12 AM

## 2015-02-03 NOTE — Progress Notes (Signed)
ANTIBIOTIC CONSULT NOTE - INITIAL  Pharmacy Consult for aztreonam and vancomycin Indication: wound infection and cellulitis  Allergies  Allergen Reactions  . Tape Hives    Can tolerate paper tape  . Other Rash    STERI STRIPS - Blisters  . Penicillins Rash    Denies airway involvement    Patient Measurements: Height: 5' 7.5" (171.5 cm) Weight: 156 lb (70.761 kg) IBW/kg (Calculated) : 62.75   Vital Signs: Temp: 98.8 F (37.1 C) (09/10 0508) Temp Source: Oral (09/10 0508) BP: 120/64 mmHg (09/10 0508) Pulse Rate: 97 (09/10 0508) Intake/Output from previous day: 09/09 0701 - 09/10 0700 In: 370 [P.O.:360; I.V.:10] Out: -  Intake/Output from this shift:    Labs:  Recent Labs  02/01/15 1619 02/02/15 0610  WBC 6.4 6.2  HGB 10.4* 10.2*  PLT 175 205  CREATININE 0.57 0.44   Estimated Creatinine Clearance: 76 mL/min (by C-G formula based on Cr of 0.44). No results for input(s): VANCOTROUGH, VANCOPEAK, VANCORANDOM, GENTTROUGH, GENTPEAK, GENTRANDOM, TOBRATROUGH, TOBRAPEAK, TOBRARND, AMIKACINPEAK, AMIKACINTROU, AMIKACIN in the last 72 hours.   Microbiology: Recent Results (from the past 720 hour(s))  Culture, blood (routine x 2)     Status: None (Preliminary result)   Collection Time: 02/01/15  4:10 PM  Result Value Ref Range Status   Specimen Description BLOOD RIGHT ANTECUBITAL  Final   Special Requests BOTTLES DRAWN AEROBIC AND ANAEROBIC 5CC  Final   Culture   Final    NO GROWTH 2 DAYS Performed at [facility]    Report Status PENDING  Incomplete  Culture, blood (routine x 2)     Status: None (Preliminary result)   Collection Time: 02/01/15  4:15 PM  Result Value Ref Range Status   Specimen Description BLOOD RIGHT HAND  Final   Special Requests BOTTLES DRAWN AEROBIC AND ANAEROBIC 5CC  Final   Culture   Final    NO GROWTH 2 DAYS Performed at [facility]    Report Status PENDING  Incomplete  Urine culture     Status: None   Collection Time:  02/01/15  4:54 PM  Result Value Ref Range Status   Specimen Description URINE, CLEAN CATCH  Final   Special Requests NONE  Final   Culture   Final    NO GROWTH 1 DAY Performed at [facility]    Report Status 02/02/2015 FINAL  Final    Medical History: Past Medical History  Diagnosis Date  . Elevated blood pressure reading without diagnosis of hypertension     PT MONITORS HER B/P AT HOME  . 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 Arthritis KNEES  . 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 Chronic anticoagulation HX  PORT-A-CATH CLOT    2010 - HAS BEEN ON LOVENOX SINCE THE CLOT  . 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 celecoxib  200 mg Oral BID  . cholecalciferol  2,000 Units Oral Once per day on Mon Wed Fri  . darifenacin  15 mg Oral Daily  . docusate sodium  100 mg Oral BID  . enoxaparin  70 mg Subcutaneous Q24H  . gabapentin  300 mg Oral TID  . sodium chloride  3 mL  Intravenous Q12H   Assessment: 59 y.o. female with below past medical history including breast cancer, status post mastectomy on Kadcyla chemotherapy every 3 weeks at [facility] who comes with complaining of daily fever and chills since Tuesday, which does not have any other specific pattern and is not associated with any other symptoms like headache, earaches, rhinorrhea, sore throat, productive cough, abdominal pain, nausea oremesis, urinary symptoms.  She also reports a bug bite about 2 weeks ago, which was treated with doxycycline and except for a very small residual mark near her left knee, she does not report any other symptoms  Goal of Therapy:  Vancomycin trough level 10-15 mcg/ml aztreonam per renal function  Plan:  Aztreonam 1gm IV  q8h Vancomycin $RemoveBefore'750mg'COQaCfBoiayng$  IV q12h vanc trough at steady state Follow renal function, cultures, clinical course   Dolly Rias RPh 02/03/2015, 12:02 PM Pager [phone]

## 2015-02-03 NOTE — Progress Notes (Signed)
Patient transferred to [place] via [place]. Report called to receiving RN.

## 2015-02-03 NOTE — Discharge Summary (Addendum)
Physician Discharge Summary  [name] IOE:[id number] DOB: November 04, 1955 DOA: 02/01/2015  PCP: Drema Pry, DO  Admit date: 02/01/2015 Discharge date: 02/03/2015  Time spent: 25 minutes  Recommendations for Outpatient Follow-up:  1. 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 female with below past medical history including breast cancer, status post mastectomy on Kadcyla chemotherapy every 3 weeks at [facility] who comes with complaining of daily fever and chills since Tuesday, which does not have any other specific pattern and is not associated with any other symptoms like headache, earaches, rhinorrhea, sore throat, productive cough, abdominal pain, nausea oremesis, urinary symptoms.  However the patient states that she received the influenza vaccine on Monday, had diarrhea 2 episodes following a meal with [name] that she had on the evening. She also states that she had her teeth cleaned on Tuesday, but she usually does not get a fever following this. She also reports a bug bite about 2 weeks ago, which was treated with doxycycline and except for a very small residual mark near her left knee, she does not report any other symptoms  Hospital Course:  1. Fever- patient initially presented with fever with no complaints and no signs or symptoms of infection. WBC normal. UA is clear, chest x-ray negative. Infectious disease was consulted. And blood cultures were obtained. This morning while taking shower patient notices that her right chest wall was more red and tender. Patient is supposed to get surgery on Friday at [facility].  Oncologist from [place] Dr. Jarome Lamas called me that patient can be started on IV antibiotic sent transferred to [facility] for further management. We'll start the patient on vancomycin and Aztreonam as patient is allergic to penicillin, will also obtain the wound culture from the right chest wall wound. 2. Breast cancer, left breast- continue chemotherapy as per oncology   3. Neuropathy- secondary to chemotherapy, continue Neurontin 4. Disposition- patient will be transferred to [facility] today. Called and conformed with transfer coordinator. She will be admitted under Dr. Jarome Lamas.  Procedures:  None  Consultations:  Infectious disease  Discharge Exam: Filed Vitals:   02/03/15 0508  BP: 120/64  Pulse: 97  Temp: 98.8 F (37.1 C)  Resp: 18    General: Appears in no acute distress Cardiovascular: S1-S2 regular Respiratory: Clear to auscultation bilaterally Chest wall- large wound noted on the lateral aspect of right chest wall under the right axilla, erythema and tenderness noted on palpation of the right chest wall under right breast.  Discharge Instructions   Discharge Instructions    Diet - low sodium heart healthy    Complete by:  As directed      Increase activity slowly    Complete by:  As directed           Current Discharge Medication List    CONTINUE these medications which have NOT CHANGED   Details  Ado-Trastuzumab Emtansine (KADCYLA IV) Inject into the vein. 2.5mg /kg/ Chemotherapy drug given every 3 weeks at [facility].    celecoxib (CELEBREX) 200 MG capsule Take 1 capsule (200 mg  total) by mouth 2 (two) times daily. Qty: 60 capsule, Refills: 5    Cholecalciferol (VITAMIN D3) 2000 UNITS TABS Take 1 tablet by mouth 3 (three) times a week.     darifenacin (ENABLEX) 15 MG 24 hr tablet Take 15 mg by mouth daily.    docusate sodium 100 MG CAPS Take 100 mg by mouth 2 (two) times daily. Qty: 10 capsule    enoxaparin (LOVENOX) 80 MG/0.8ML  injection Inject 0.7 mLs (70 mg total) into the skin daily. Qty: 0 Syringe    gabapentin (NEURONTIN) 300 MG capsule TAKE 1 CAPSULE BY MOUTH 3 TIMES DAILY. Qty: 90 capsule, Refills: 0    ondansetron (ZOFRAN) 8 MG tablet Take 1 tablet (8 mg total) by mouth every 8 (eight) hours as needed for nausea.   Associated Diagnoses: Breast cancer    traMADol (ULTRAM) 50 MG tablet TAKE 1 TABLET BY MOUTH EVERY 6 HOURS AS NEEDED FOR PAIN Qty: 60 tablet, Refills: 5    zolpidem (AMBIEN) 5 MG tablet Take 1 tablet (5 mg total) by mouth at bedtime as needed for sleep. Qty: 30 tablet, Refills: 3    furosemide (LASIX) 20 MG tablet Take 1 tablet (20 mg total) by mouth daily as needed. Qty: 90 tablet, Refills: 3       Allergies  Allergen Reactions  . Tape Hives    Can tolerate paper tape  . Other Rash    STERI STRIPS - Blisters  . Penicillins Rash    Denies airway involvement      The results of significant diagnostics from this hospitalization (including imaging, microbiology, ancillary and laboratory) are listed below for reference.    Significant Diagnostic Studies: Dg Chest 2 View  02/01/2015   CLINICAL DATA:  Fever. Personal history of left-sided breast cancer with nodal metastasis.  EXAM: CHEST  2 VIEW  COMPARISON:  01/11/2014.  FINDINGS: LEFT mastectomy and LEFT axillary dissection. Distortion of the RIGHT breast shadow is present, also likely postsurgical. Surgical clips project over the RIGHT chest. Upward retraction of the LEFT hilum is probably related to scarring. There is no airspace disease. On the lateral projection, there is patchy density in the retrosternal region, suspicious for bronchopneumonia, particularly in a patient with potential for immunosuppression.  The cardiopericardial silhouette is within normal limits. RIGHT IJ Port-A-Cath present.  IMPRESSION: 1. Patchy density in the retrosternal region on lateral projection is suspicious for pneumonia/airspace disease in a patient with  fever. By history, patient has potential for immunocompromise and empiric antibiotic treatment is recommended. 2. Extensive postsurgical changes related to breast cancer.   Electronically Signed   By: Dereck Ligas M.D.   On: 02/01/2015 17:05    Microbiology: Recent Results (from the past 240 hour(s))  Culture, blood (routine x 2)     Status: None (Preliminary result)   Collection Time: 02/01/15  4:10 PM  Result Value Ref Range Status   Specimen Description BLOOD RIGHT ANTECUBITAL  Final   Special Requests BOTTLES DRAWN AEROBIC AND ANAEROBIC 5CC  Final   Culture   Final    NO GROWTH 2 DAYS Performed at [facility]    Report Status PENDING  Incomplete  Culture, blood (routine x 2)     Status: None (Preliminary result)   Collection Time: 02/01/15  4:15 PM  Result Value Ref Range Status   Specimen Description BLOOD RIGHT HAND  Final   Special Requests BOTTLES DRAWN AEROBIC AND ANAEROBIC 5CC  Final   Culture   Final  NO GROWTH 2 DAYS Performed at [facility]    Report Status PENDING  Incomplete  Urine culture     Status: None   Collection Time: 02/01/15  4:54 PM  Result Value Ref Range Status   Specimen Description URINE, CLEAN CATCH  Final   Special Requests NONE  Final   Culture   Final    NO GROWTH 1 DAY Performed at [facility]    Report Status 02/02/2015 FINAL  Final     Labs: Basic Metabolic Panel:  Recent Labs Lab 02/01/15 1619 02/01/15 2032 02/02/15 0610  NA 134*  --  136  K 3.5  --  3.8  CL 97*  --  103  CO2 26  --  26  GLUCOSE 162*  --  105*  BUN 9  --  7  CREATININE 0.57  --  0.44  CALCIUM 8.9  --  8.7*  MG  --  1.8  --    Liver Function Tests:  Recent Labs Lab 02/01/15 1619 02/02/15 0610  AST 74* 66*  ALT 35 35  ALKPHOS 115 115  BILITOT 1.3* 1.1  PROT 7.3 7.5  ALBUMIN 3.4* 3.2*   No results for input(s): LIPASE, AMYLASE in the last 168 hours. No results for input(s): AMMONIA in the last 168 hours. CBC:  Recent  Labs Lab 02/01/15 1619 02/02/15 0610  WBC 6.4 6.2  NEUTROABS 4.8  --   HGB 10.4* 10.2*  HCT 32.0* 31.1*  MCV 91.2 91.2  PLT 175 205   Cardiac Enzymes: No results for input(s): CKTOTAL, CKMB, CKMBINDEX, TROPONINI in the last 168 hours. BNP: BNP (last 3 results) No results for input(s): BNP in the last 8760 hours.  ProBNP (last 3 results) No results for input(s): PROBNP in the last 8760 hours.  CBG: No results for input(s): GLUCAP in the last 168 hours.     SignedEleonore Chiquito S  [facility] 02/03/2015, 11:57 AM

## 2015-02-06 LAB — WOUND CULTURE: Gram Stain: NONE SEEN

## 2015-02-06 LAB — CULTURE, BLOOD (ROUTINE X 2)
CULTURE: NO GROWTH
Culture: NO GROWTH

## 2015-02-08 LAB — CULTURE, BLOOD (ROUTINE X 2): Culture: NO GROWTH

## 2015-02-09 LAB — CULTURE, BLOOD (ROUTINE X 2)

## 2015-02-19 ENCOUNTER — Other Ambulatory Visit: Payer: Self-pay | Admitting: Internal Medicine

## 2015-02-20 ENCOUNTER — Other Ambulatory Visit: Payer: Self-pay | Admitting: *Deleted

## 2015-02-20 NOTE — Patient Outreach (Signed)
[place] [facility]) Care Management  02/20/2015  [name] [birth year] [id number]   Referral received pt [facility] pt admitted to [facility] 9/10-9/20 for breast cancer. RN intervened with the initial outreach attempt to this pt however was only able to leave an HIPAA approved voice message requesting a call back. Will continue to follow up accordingly with this pt concerning post-op care and discharge recommendations.   [name], RN Care Management Coordinator [place] [facility] Main Office [phone]

## 2015-02-20 NOTE — Patient Outreach (Signed)
[place] [facility]) Care Management  02/20/2015  [name] [birth year] [id number]   Referral from [facility] list, assigned [name], RN for patient outreach.  Blaiden Werth L. Cagney Degrace, [place] Care Management Assistant

## 2015-02-22 ENCOUNTER — Other Ambulatory Visit: Payer: Self-pay | Admitting: *Deleted

## 2015-02-22 NOTE — Patient Outreach (Signed)
[place] [facility]) Care Management  02/22/2015  [name] 14-Jan-1956 [id number]    Second outreach [phone] attempted to contact with this pt however unsuccessful. RN able to leave a HIPAA approved voice message requesting a call back. Will continue outreach attempts to inquire further.    [name], RN Care Management Coordinator [place] Network Main Office [phone]

## 2015-02-22 NOTE — Patient Outreach (Signed)
[place] [facility]) Care Management  02/22/2015  ANAKAREN CAMPION 06-01-1955 [id number]   RN received a call from pt to inquire further on the message left to her voice mail. RN introduced the [facility] services and purpose for today's call. Pt appreciative and states she is recovering however will undergo a "flap" tomorrow. RN provider [facility] resources with use of the campus pharmacy for prescription medication for a reduce cost to employee and inquired on any other needed resources. Pt again appreciative but feels she does not further follow up contact and decline any further services for [facility] services. RN has explained that additional referral may come through [facility] and she again may receive additional inquired  For [facility] assistance  pt receptive). No further inquires or request at this time as RN will officially discharge pt from further telephonic contact regarding her recent hospitalization.  [name], RN Care Management Coordinator [place] Network Main Office [phone]

## 2015-02-28 NOTE — Patient Outreach (Signed)
[place] [facility]) Care Management  02/28/2015  KEARSTIN LEARN 07/20/1955 [id number]   Notification from [name], RN to close case due to patient refused [place] Management services.  Thanks, Ronnell Freshwater. [place], [place] Assistant Phone: [phone] Fax: [phone]

## 2015-03-04 ENCOUNTER — Encounter: Payer: Self-pay | Admitting: Family Medicine

## 2015-03-05 ENCOUNTER — Other Ambulatory Visit: Payer: Self-pay | Admitting: *Deleted

## 2015-03-05 MED ORDER — ZOLPIDEM TARTRATE 5 MG PO TABS: 5.0000 mg | ORAL_TABLET | Freq: Every evening | ORAL | Status: DC | PRN

## 2015-03-05 NOTE — Telephone Encounter (Signed)
Pt last visit was 01/19/15 and last refill 11/29/14 #30 with 3 re fills. Please advise

## 2015-03-05 NOTE — Telephone Encounter (Signed)
Rx phoned to the pharmacy and I informed her via Mychart message.

## 2015-04-16 ENCOUNTER — Other Ambulatory Visit: Payer: Self-pay | Admitting: Internal Medicine

## 2015-04-18 ENCOUNTER — Ambulatory Visit (HOSPITAL_COMMUNITY)
Admission: RE | Admit: 2015-04-18 | Discharge: 2015-04-18 | Disposition: A | Discharge status: Home / Self Care | Payer: 59 | Source: Ambulatory Visit | Attending: Internal Medicine | Admitting: Internal Medicine

## 2015-04-18 ENCOUNTER — Ambulatory Visit (HOSPITAL_BASED_OUTPATIENT_CLINIC_OR_DEPARTMENT_OTHER)
Admission: RE | Admit: 2015-04-18 | Discharge: 2015-04-18 | Disposition: A | Discharge status: Home / Self Care | Payer: 59 | Source: Ambulatory Visit | Attending: Internal Medicine | Admitting: Internal Medicine

## 2015-04-18 VITALS — BP 134/76 | HR 95 | Wt 164.8 lb

## 2015-04-18 DIAGNOSIS — C50012 Malignant neoplasm of nipple and areola, left female breast: Secondary | ICD-10-CM | POA: Insufficient documentation

## 2015-04-18 DIAGNOSIS — C50912 Malignant neoplasm of unspecified site of left female breast: Secondary | ICD-10-CM

## 2015-04-18 NOTE — Progress Notes (Signed)
Advanced Heart Failure Medication Review by a Pharmacist  Does the patient  feel that his/her medications are working for him/her?  yes  Has the patient been experiencing any side effects to the medications prescribed?  no  Does the patient measure his/her own blood pressure or blood glucose at home?  yes   Does the patient have any problems obtaining medications due to transportation or finances?   no  Understanding of regimen: excellent Understanding of indications: excellent Potential of compliance: excellent Patient understands to avoid NSAIDs. Patient understands to avoid decongestants.  Issues to address at subsequent visits: None   Pharmacist comments:  Ms. Ollar is a pleasant 59 yo F presenting without a medication list but with excellent recall of her regimen including dosages. She reports excellent compliance with her medications and states that she has not required any lasix recently. She did not have any specific medication-related questions or concerns for me at this time.   Ruta Hinds. Velva Harman, PharmD, BCPS, CPP Clinical Pharmacist Pager: [phone] Phone: [phone] 04/18/2015 10:57 AM      Time with patient: 6 minutes Preparation and documentation time: 2 minutes Total time: 8 minutes

## 2015-04-18 NOTE — Progress Notes (Signed)
  Echocardiogram 2D Echocardiogram has been performed.  [name] 04/18/2015, 11:03 AM

## 2015-04-18 NOTE — Progress Notes (Signed)
Patient ID: [name], female   DOB: 08-11-1955, 59 y.o.   MRN: [id number]     Cardio-Oncology Clinic Note  Referring: Dr. 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   She is s/p bilateral mastectomies, XRT, chemo. She is currently on Kadcyla (trastuzamab-emantsine) every 3 weeks. Underwent skin flap for skim metastasis on her abdomen in 2/15  She returns for follow up. Had skin resection with chest skin flap on 02/21/15 at [facility]. Requiring wound vac. Path ok fortunately with no recurrent malignancy.  Doing well. Walking in neighborhood. Trace LE edema at night.  She continues on [place] every 3 weeks. Denies SOB/Orthopnea. Hemoglobin coming up. Now at 9.3.  ECHO 09/27/2013 EF 60-65% Lateral S'10.7 Grade 2 DD ECHO 02/27/2014 EF 60-65% Lateral S' 10.4  ECHO 07/14/2014  EF 60-65% Lateral S' 10.4 GLS -23.1% mild MR ECHO 04/18/2015  EF 60-65% Lateral S' 13.6 GLS -19.2% mild TR Trivial MR. RV ok   Past Medical History  Diagnosis Date  . Elevated blood pressure reading without diagnosis of hypertension     PT MONITORS HER B/P AT HOME  . 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 Arthritis KNEES  . 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 Chronic anticoagulation HX  PORT-A-CATH CLOT    2010 - HAS BEEN ON LOVENOX SINCE THE CLOT  . Skin cancer of anterior chest BREAST CANCER PRIMARY W/ METS TO CHEST WALL SKIN CANCER--  CHEMOTHERAPY EVERY 3 WEEKS AT [facility]    Current Outpatient Prescriptions  Medication Sig Dispense Refill  . Ado-Trastuzumab Emtansine (KADCYLA IV) Inject into the vein. 2.$RemoveBef'5mg'QzhqsEPmni$ /kg/ Chemotherapy drug given every 3 weeks at [facility].    . celecoxib (CELEBREX) 200 MG capsule TAKE 1 CAPSULE BY MOUTH TWICE DAILY 60 capsule 5  . Cholecalciferol (VITAMIN D3) 2000 UNITS  TABS Take 1 tablet by mouth 4 (four) times a week.     . darifenacin (ENABLEX) 15 MG 24 hr tablet Take 15 mg by mouth daily.    [name] docusate sodium 100 MG CAPS Take 100 mg by mouth 2 (two) times daily. 10 capsule   . enoxaparin (LOVENOX) 80 MG/0.8ML injection Inject 0.7 mLs (70 mg total) into the skin daily. 0 Syringe   . gabapentin (NEURONTIN) 300 MG capsule TAKE 1 CAPSULE BY MOUTH 3 TIMES DAILY. 90 capsule 0  . ondansetron (ZOFRAN) 8 MG tablet Take 1 tablet (8 mg total) by mouth every 8 (eight) hours as needed for nausea.    . traMADol (ULTRAM) 50 MG tablet TAKE 1 TABLET BY MOUTH EVERY 6 HOURS AS NEEDED FOR PAIN 60 tablet 5  . zolpidem (AMBIEN) 5 MG tablet Take 1 tablet (5 mg total) by mouth at bedtime as needed for sleep. 30 tablet 3  . furosemide (LASIX) 20 MG tablet Take 1 tablet (20 mg total) by mouth daily as needed. (Patient not taking: Reported on 02/01/2015) 90 tablet 3   No current facility-administered medications for this encounter.    Allergies  Allergen Reactions  . Tape Hives    Can tolerate paper tape  . Other Rash    STERI STRIPS - Blisters  . Penicillins Rash    Denies airway involvement    Social History   Social History  .  Marital Status: Married    Spouse Name: N/A  . Number of Children: 3  . Years of Education: N/A   Occupational History  . 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 Smoking status: Never Smoker   . Smokeless tobacco: Never Used  . Alcohol Use: No  . Drug Use: No  . Sexual Activity: Not on file   Other Topics Concern  . 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          Family History  Problem Relation Age of Onset  . Heart disease Mother     due to mitral valve regurgiation,   . Cancer Mother     breast  . Parkinsonism Father   . Stroke Sister   . Alcohol abuse  Paternal Grandfather     PHYSICAL EXAM: Filed Vitals:   04/18/15 1037  BP: 134/76  Pulse: 95  Weight: 164 lb 12.8 oz (74.753 kg)  SpO2: 99%   General:  Well appearing. No respiratory difficulty HEENT: normal Neck: supple. no JVD. Carotids 2+ bilat; no bruits. No lymphadenopathy or thryomegaly appreciated. Cor: PMI nondisplaced. Regular rate & rhythm. No rubs, gallops or murmurs. Lungs: clear Abdomen: soft, nontender, nondistended. No hepatosplenomegaly. No bruits or masses. Good bowel sounds. Extremities: no cyanosis, clubbing, rash, tr edema Neuro: alert & oriented x 3, cranial nerves grossly intact. moves all 4 extremities w/o difficulty. Affect pleasant.    ASSESSMENT & PLAN: 1. Breast CA - HER 2-neu+ , ER/PR -, BRCA - 2. Anemia  I reviewed echos personally. EF and Doppler parameters stable. No HF on exam. Continue Kadcyla. Repeat echo 4 months.  Sheika Coutts,MD 11:23 AM

## 2015-04-18 NOTE — Addendum Note (Signed)
Encounter addended by: Scarlette Calico, RN on: 04/18/2015 11:33 AM<BR>     Documentation filed: Patient Instructions Section

## 2015-04-18 NOTE — Patient Instructions (Signed)
We will contact you in 4 months to schedule your next appointment and echocardiogram  

## 2015-05-07 ENCOUNTER — Other Ambulatory Visit: Payer: Self-pay | Admitting: Internal Medicine

## 2015-05-29 DIAGNOSIS — C50412 Malignant neoplasm of upper-outer quadrant of left female breast: Secondary | ICD-10-CM | POA: Diagnosis not present

## 2015-05-29 DIAGNOSIS — Z5112 Encounter for antineoplastic immunotherapy: Secondary | ICD-10-CM | POA: Diagnosis not present

## 2015-05-29 DIAGNOSIS — C792 Secondary malignant neoplasm of skin: Secondary | ICD-10-CM | POA: Diagnosis not present

## 2015-06-06 ENCOUNTER — Encounter: Payer: Self-pay | Admitting: Internal Medicine

## 2015-06-06 ENCOUNTER — Ambulatory Visit (INDEPENDENT_AMBULATORY_CARE_PROVIDER_SITE_OTHER): Payer: 59 | Admitting: Internal Medicine

## 2015-06-06 VITALS — BP 120/72 | HR 95 | Temp 98.4°F | Ht 67.5 in | Wt 168.3 lb

## 2015-06-06 DIAGNOSIS — J0100 Acute maxillary sinusitis, unspecified: Secondary | ICD-10-CM | POA: Diagnosis not present

## 2015-06-06 MED ORDER — CEFUROXIME AXETIL 500 MG PO TABS: 500.0000 mg | ORAL_TABLET | Freq: Two times a day (BID) | ORAL | Status: AC

## 2015-06-06 MED FILL — ZOLPIDEM TARTRATE 5 MG TAB: 5 | 30 days supply | Qty: 30 | Fill #3

## 2015-06-06 MED FILL — CEFUROXIME AXETIL 500 MG TA: 500 | 10 days supply | Qty: 20 | Fill #0

## 2015-06-06 NOTE — Progress Notes (Signed)
Subjective:    Patient ID: [name], female    DOB: [birth year], 60 y.o.   MRN: [id number]  HPI  60 year old female with hx of metastatic breast cancer complains of sinus congestion and right side facial pain.  Her symptoms started 2 weeks ago.  She notes discolored mucus / nasal discharge.  Breast cancer - she had issue with wound / hematoma right chest wall.  There was concern for recurrent metastatic disease.  She was transferred to [facility].  Surgical debridement and later analysis did not show breast cancer recurrence  Review of Systems Negative for fever or chills    Past Medical History  Diagnosis Date  . Elevated blood pressure reading without diagnosis of hypertension     PT MONITORS HER B/P AT HOME  . 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 Arthritis KNEES  . 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 Chronic anticoagulation HX  PORT-A-CATH CLOT    2010 - HAS BEEN ON LOVENOX SINCE THE CLOT  . Skin cancer of anterior chest BREAST CANCER PRIMARY W/ METS TO CHEST WALL SKIN CANCER--  CHEMOTHERAPY EVERY 3 WEEKS AT [facility]    Social History   Social History  . Marital Status: Married    Spouse Name: N/A  . Number of Children: 3  . Years of Education: N/A   Occupational History  . 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 Smoking status: Never Smoker   . Smokeless tobacco: Never Used  . Alcohol Use: No  . Drug Use: No  . Sexual Activity: Not on file   Other Topics Concern  . 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          Past Surgical History  Procedure Laterality Date  . Left modified radical mastectomy/ right total mastectomy  11-13-2007    LEFT BREAST CANCER W/  AXILLARY LYMPH NODE METASTASIS AND POST NEOADJUVANT CHEMO  . Placement port-a-cath  06-22-2007  . Transthoracic echocardiogram  11-18-2011  DR BENSIMHON (ECHO EVERY 3 MONTHS)    HX CHEMO INDUCED CARDIOTOXICITY/  LVSF NORMAL/ EF 55-50%/ MILD MITRIAL VALVE REGURG./ MILDLY INCREASED SYSTOLIC PRESSURE OF PULMONARY ARTERIES  . Knee arthroscopy  02/13/2012    Procedure: ARTHROSCOPY KNEE;  Surgeon: Johnn Hai, MD;  Location: [facility];  Service: Orthopedics;  Laterality: Left;  WITH DEBRIDEMENt   . Knee arthroscopy with lateral menisectomy  02/13/2012    Procedure: KNEE ARTHROSCOPY WITH LATERAL MENISECTOMY;  Surgeon: Johnn Hai, MD;  Location: [facility];  Service: Orthopedics;;  partial  . Total knee arthroplasty Left 09/23/2012    Procedure: LEFT TOTAL KNEE ARTHROPLASTY;  Surgeon: Johnn Hai, MD;  Location: [facility];  Service: Orthopedics;  Laterality: Left;  . Chest wall tumor excision    . Skin graft      chest-post tumor removal    Family History  Problem Relation Age of Onset  . Heart disease Mother     due to mitral valve regurgiation,   . Cancer Mother     breast  .  Parkinsonism Father   . Stroke Sister   . Alcohol abuse Paternal Grandfather     Allergies  Allergen Reactions  . Tape Hives    Can tolerate paper tape  . Other Rash    STERI STRIPS - Blisters  . Penicillins Rash    Denies airway involvement    Current Outpatient Prescriptions on File Prior to Visit  Medication Sig Dispense Refill  . Ado-Trastuzumab Emtansine (KADCYLA IV) Inject into the vein. 2.92m/kg/ [facility]    . celecoxib (CELEBREX) 200 MG capsule TAKE 1 CAPSULE BY MOUTH TWICE DAILY 60 capsule 5  . Cholecalciferol (VITAMIN D3) 2000 UNITS TABS Take 1 tablet by mouth 4 (four) times a week.     . darifenacin (ENABLEX) 15 MG 24 hr tablet Take 15 mg by mouth daily.    .Marland Kitchendocusate sodium 100 MG CAPS Take 100 mg by mouth 2  (two) times daily. 10 capsule   . furosemide (LASIX) 20 MG tablet Take 1 tablet (20 mg total) by mouth daily as needed. 90 tablet 3  . gabapentin (NEURONTIN) 300 MG capsule TAKE 1 CAPSULE BY MOUTH 3 TIMES DAILY. 90 capsule 1  . ondansetron (ZOFRAN) 8 MG tablet Take 1 tablet (8 mg total) by mouth every 8 (eight) hours as needed for nausea.    . traMADol (ULTRAM) 50 MG tablet TAKE 1 TABLET BY MOUTH EVERY 6 HOURS AS NEEDED FOR PAIN 60 tablet 5  . zolpidem (AMBIEN) 5 MG tablet Take 1 tablet (5 mg total) by mouth at bedtime as needed for sleep. 30 tablet 3   No current facility-administered medications on file prior to visit.    BP 120/72 mmHg  Pulse 95  Temp(Src) 98.4 F (36.9 C) (Oral)  Ht 5' 7.5" (1.715 m)  Wt 168 lb 4.8 oz (76.34 kg)  BMI 25.96 kg/m2  SpO2 95%  LMP 06/22/2007    Objective:   Physical Exam  Constitutional: She is oriented to person, place, and time. She appears well-developed and well-nourished.  Cardiovascular: Normal rate, regular rhythm and normal heart sounds.   No murmur heard. Pulmonary/Chest: Effort normal and breath sounds normal. She has no wheezes.  Musculoskeletal: She exhibits no edema.  Neurological: She is alert and oriented to person, place, and time. No cranial nerve deficit.  Psychiatric: She has a normal mood and affect. Her behavior is normal.        Assessment & Plan:    1.  Acute sinusitis 2.  Breast cancer  Penicillins cause hives and she has intolerance to doxycycline.  She has tolerated IV Ancef in the past.  Treat with cefuroxime 500 mg bid for 10 days.  Use intranasal saline.  Followed and managed by oncologist.

## 2015-06-06 NOTE — Progress Notes (Signed)
Pre visit review using our clinic review tool, if applicable. No additional management support is needed unless otherwise documented below in the visit note. 

## 2015-06-15 DIAGNOSIS — C50911 Malignant neoplasm of unspecified site of right female breast: Secondary | ICD-10-CM | POA: Diagnosis not present

## 2015-06-15 DIAGNOSIS — Z9013 Acquired absence of bilateral breasts and nipples: Secondary | ICD-10-CM | POA: Diagnosis not present

## 2015-06-15 DIAGNOSIS — Z9889 Other specified postprocedural states: Secondary | ICD-10-CM | POA: Diagnosis not present

## 2015-06-18 DIAGNOSIS — Z9013 Acquired absence of bilateral breasts and nipples: Secondary | ICD-10-CM | POA: Diagnosis not present

## 2015-06-18 DIAGNOSIS — Z5112 Encounter for antineoplastic immunotherapy: Secondary | ICD-10-CM | POA: Diagnosis not present

## 2015-06-18 DIAGNOSIS — Z171 Estrogen receptor negative status [ER-]: Secondary | ICD-10-CM | POA: Diagnosis not present

## 2015-06-18 DIAGNOSIS — Z7901 Long term (current) use of anticoagulants: Secondary | ICD-10-CM | POA: Diagnosis not present

## 2015-06-18 DIAGNOSIS — C50911 Malignant neoplasm of unspecified site of right female breast: Secondary | ICD-10-CM | POA: Diagnosis not present

## 2015-06-18 DIAGNOSIS — T86821 Skin graft (allograft) (autograft) failure: Secondary | ICD-10-CM | POA: Diagnosis not present

## 2015-06-18 DIAGNOSIS — C50412 Malignant neoplasm of upper-outer quadrant of left female breast: Secondary | ICD-10-CM | POA: Diagnosis not present

## 2015-06-18 DIAGNOSIS — D649 Anemia, unspecified: Secondary | ICD-10-CM | POA: Diagnosis not present

## 2015-06-18 DIAGNOSIS — C792 Secondary malignant neoplasm of skin: Secondary | ICD-10-CM | POA: Diagnosis not present

## 2015-06-18 DIAGNOSIS — C761 Malignant neoplasm of thorax: Secondary | ICD-10-CM | POA: Diagnosis not present

## 2015-06-18 DIAGNOSIS — C50912 Malignant neoplasm of unspecified site of left female breast: Secondary | ICD-10-CM | POA: Diagnosis not present

## 2015-06-18 MED FILL — ELIQUIS 5 MG TABLET: 5 | 30 days supply | Qty: 60 | Fill #1

## 2015-06-19 MED FILL — TRUVADA 200-300 MG TABS: 200-300 | 28 days supply | Qty: 30 | Fill #0

## 2015-06-19 MED FILL — ISENTRESS 400 MG TABS: 400 | 28 days supply | Qty: 60 | Fill #0

## 2015-06-29 MED FILL — CELECOXIB 200 MG CAPSULE: 200 | 30 days supply | Qty: 60 | Fill #1

## 2015-06-29 MED FILL — GABAPENTIN 300 MG CAPSULE: 300 | 30 days supply | Qty: 90 | Fill #1

## 2015-06-29 MED FILL — DARIFENACIN ER 15 MG TABLET: 15 | 30 days supply | Qty: 30 | Fill #4

## 2015-07-05 ENCOUNTER — Other Ambulatory Visit: Payer: Self-pay | Admitting: Internal Medicine

## 2015-07-05 ENCOUNTER — Telehealth: Payer: Self-pay | Admitting: Internal Medicine

## 2015-07-05 NOTE — Telephone Encounter (Signed)
Pt request refill of the following: zolpidem (AMBIEN) 5 MG tablet   Phamacy: [facility] [address]

## 2015-07-09 ENCOUNTER — Encounter: Payer: Self-pay | Admitting: Internal Medicine

## 2015-07-09 DIAGNOSIS — Z5112 Encounter for antineoplastic immunotherapy: Secondary | ICD-10-CM | POA: Diagnosis not present

## 2015-07-09 DIAGNOSIS — C50412 Malignant neoplasm of upper-outer quadrant of left female breast: Secondary | ICD-10-CM | POA: Diagnosis not present

## 2015-07-09 DIAGNOSIS — C792 Secondary malignant neoplasm of skin: Secondary | ICD-10-CM | POA: Diagnosis not present

## 2015-07-09 MED ORDER — ZOLPIDEM TARTRATE 5 MG PO TABS: 5.0000 mg | ORAL_TABLET | Freq: Every evening | ORAL | Status: DC | PRN

## 2015-07-09 MED FILL — ZOLPIDEM TARTRATE 5 MG TAB: 5 | 30 days supply | Qty: 30 | Fill #0

## 2015-07-09 NOTE — Telephone Encounter (Signed)
Ok to RF x 3 

## 2015-07-23 MED FILL — ELIQUIS 5 MG TABLET: 5 | 30 days supply | Qty: 60 | Fill #2

## 2015-07-29 ENCOUNTER — Encounter: Payer: Self-pay | Admitting: Internal Medicine

## 2015-07-30 ENCOUNTER — Other Ambulatory Visit: Payer: Self-pay | Admitting: Internal Medicine

## 2015-07-30 DIAGNOSIS — C792 Secondary malignant neoplasm of skin: Secondary | ICD-10-CM | POA: Diagnosis not present

## 2015-07-30 DIAGNOSIS — C50412 Malignant neoplasm of upper-outer quadrant of left female breast: Secondary | ICD-10-CM | POA: Diagnosis not present

## 2015-07-30 DIAGNOSIS — Z5112 Encounter for antineoplastic immunotherapy: Secondary | ICD-10-CM | POA: Diagnosis not present

## 2015-07-30 MED ORDER — GABAPENTIN 300 MG PO CAPS: ORAL_CAPSULE | ORAL | Status: DC

## 2015-07-30 MED FILL — DARIFENACIN ER 15 MG TABLET: 15 | 30 days supply | Qty: 30 | Fill #5

## 2015-07-30 MED FILL — CELECOXIB 200 MG CAPSULE: 200 | 30 days supply | Qty: 60 | Fill #2

## 2015-07-30 MED FILL — GABAPENTIN 300 MG CAPSULE: 300 | 30 days supply | Qty: 90 | Fill #0

## 2015-08-06 MED FILL — ZOLPIDEM TARTRATE 5 MG TAB: 5 | 30 days supply | Qty: 30 | Fill #1

## 2015-08-20 DIAGNOSIS — C792 Secondary malignant neoplasm of skin: Secondary | ICD-10-CM | POA: Diagnosis not present

## 2015-08-20 DIAGNOSIS — Z5112 Encounter for antineoplastic immunotherapy: Secondary | ICD-10-CM | POA: Diagnosis not present

## 2015-08-20 DIAGNOSIS — C50412 Malignant neoplasm of upper-outer quadrant of left female breast: Secondary | ICD-10-CM | POA: Diagnosis not present

## 2015-08-20 DIAGNOSIS — Z79899 Other long term (current) drug therapy: Secondary | ICD-10-CM | POA: Diagnosis not present

## 2015-08-20 MED FILL — ELIQUIS 5 MG TABLET: 5 | 30 days supply | Qty: 60 | Fill #3

## 2015-09-03 MED FILL — GABAPENTIN 300 MG CAPSULE: 300 | 30 days supply | Qty: 90 | Fill #1

## 2015-09-03 MED FILL — DARIFENACIN ER 15 MG TABLET: 15 | 30 days supply | Qty: 30 | Fill #6

## 2015-09-10 ENCOUNTER — Other Ambulatory Visit: Payer: Self-pay | Admitting: Internal Medicine

## 2015-09-10 DIAGNOSIS — L659 Nonscarring hair loss, unspecified: Secondary | ICD-10-CM | POA: Diagnosis not present

## 2015-09-10 DIAGNOSIS — I749 Embolism and thrombosis of unspecified artery: Secondary | ICD-10-CM | POA: Diagnosis not present

## 2015-09-10 DIAGNOSIS — Z79899 Other long term (current) drug therapy: Secondary | ICD-10-CM | POA: Diagnosis not present

## 2015-09-10 DIAGNOSIS — Z5112 Encounter for antineoplastic immunotherapy: Secondary | ICD-10-CM | POA: Diagnosis not present

## 2015-09-10 DIAGNOSIS — Z9013 Acquired absence of bilateral breasts and nipples: Secondary | ICD-10-CM | POA: Diagnosis not present

## 2015-09-10 DIAGNOSIS — Z171 Estrogen receptor negative status [ER-]: Secondary | ICD-10-CM | POA: Diagnosis not present

## 2015-09-10 DIAGNOSIS — C792 Secondary malignant neoplasm of skin: Secondary | ICD-10-CM | POA: Diagnosis not present

## 2015-09-10 DIAGNOSIS — C50412 Malignant neoplasm of upper-outer quadrant of left female breast: Secondary | ICD-10-CM | POA: Diagnosis not present

## 2015-09-10 DIAGNOSIS — Z7901 Long term (current) use of anticoagulants: Secondary | ICD-10-CM | POA: Diagnosis not present

## 2015-09-11 ENCOUNTER — Encounter: Payer: Self-pay | Admitting: Family Medicine

## 2015-09-11 DIAGNOSIS — Z471 Aftercare following joint replacement surgery: Secondary | ICD-10-CM | POA: Diagnosis not present

## 2015-09-11 DIAGNOSIS — Z96652 Presence of left artificial knee joint: Secondary | ICD-10-CM | POA: Diagnosis not present

## 2015-09-11 DIAGNOSIS — M1711 Unilateral primary osteoarthritis, right knee: Secondary | ICD-10-CM | POA: Diagnosis not present

## 2015-09-11 DIAGNOSIS — M25561 Pain in right knee: Secondary | ICD-10-CM | POA: Diagnosis not present

## 2015-09-11 MED FILL — ZOLPIDEM TARTRATE 5 MG TAB: 5 | 30 days supply | Qty: 30 | Fill #0

## 2015-09-11 NOTE — Telephone Encounter (Signed)
Do you personally know this pt or have told her she could switch to you? If not I can let her know that you are not accepting new patients and have her pick another provider.

## 2015-09-12 NOTE — Telephone Encounter (Signed)
Can you have someone reach out to this patient and have her schedule with a provider that is accepting new patients. Thanks!

## 2015-09-19 NOTE — Telephone Encounter (Signed)
Pt has been scheduled with Dr Jordan.  

## 2015-09-24 MED FILL — DARIFENACIN ER 15 MG TABLET: 15 | 30 days supply | Qty: 30 | Fill #7

## 2015-09-24 MED FILL — ELIQUIS 5 MG TABLET: 5 | 30 days supply | Qty: 60 | Fill #4

## 2015-09-24 MED FILL — CELECOXIB 200 MG CAPSULE: 200 | 30 days supply | Qty: 60 | Fill #3

## 2015-09-28 DIAGNOSIS — Z7901 Long term (current) use of anticoagulants: Secondary | ICD-10-CM | POA: Diagnosis not present

## 2015-09-28 DIAGNOSIS — C50911 Malignant neoplasm of unspecified site of right female breast: Secondary | ICD-10-CM | POA: Diagnosis not present

## 2015-09-28 DIAGNOSIS — Z79899 Other long term (current) drug therapy: Secondary | ICD-10-CM | POA: Diagnosis not present

## 2015-10-01 DIAGNOSIS — Z7901 Long term (current) use of anticoagulants: Secondary | ICD-10-CM | POA: Diagnosis not present

## 2015-10-01 DIAGNOSIS — Z79899 Other long term (current) drug therapy: Secondary | ICD-10-CM | POA: Diagnosis not present

## 2015-10-01 DIAGNOSIS — Z5111 Encounter for antineoplastic chemotherapy: Secondary | ICD-10-CM | POA: Diagnosis not present

## 2015-10-01 DIAGNOSIS — C50911 Malignant neoplasm of unspecified site of right female breast: Secondary | ICD-10-CM | POA: Diagnosis not present

## 2015-10-01 DIAGNOSIS — Z171 Estrogen receptor negative status [ER-]: Secondary | ICD-10-CM | POA: Diagnosis not present

## 2015-10-01 DIAGNOSIS — Z9013 Acquired absence of bilateral breasts and nipples: Secondary | ICD-10-CM | POA: Diagnosis not present

## 2015-10-01 MED FILL — GABAPENTIN 300 MG CAPSULE: 300 | 30 days supply | Qty: 90 | Fill #2

## 2015-10-12 MED FILL — ZOLPIDEM TARTRATE 5 MG TAB: 5 | 30 days supply | Qty: 30 | Fill #1

## 2015-10-16 DIAGNOSIS — R350 Frequency of micturition: Secondary | ICD-10-CM | POA: Diagnosis not present

## 2015-10-16 MED FILL — VESIcare 10 MG TABS: 10 | 30 days supply | Qty: 30 | Fill #0

## 2015-10-23 DIAGNOSIS — C50412 Malignant neoplasm of upper-outer quadrant of left female breast: Secondary | ICD-10-CM | POA: Diagnosis not present

## 2015-10-23 DIAGNOSIS — C792 Secondary malignant neoplasm of skin: Secondary | ICD-10-CM | POA: Diagnosis not present

## 2015-10-23 DIAGNOSIS — Z79899 Other long term (current) drug therapy: Secondary | ICD-10-CM | POA: Diagnosis not present

## 2015-10-23 DIAGNOSIS — Z7901 Long term (current) use of anticoagulants: Secondary | ICD-10-CM | POA: Diagnosis not present

## 2015-10-23 DIAGNOSIS — Z5112 Encounter for antineoplastic immunotherapy: Secondary | ICD-10-CM | POA: Diagnosis not present

## 2015-10-24 ENCOUNTER — Other Ambulatory Visit: Payer: Self-pay | Admitting: *Deleted

## 2015-10-24 MED FILL — ELIQUIS 5 MG TABLET: 5 | 30 days supply | Qty: 60 | Fill #5

## 2015-10-24 NOTE — Patient Outreach (Signed)
[facility]) Care Management  10/24/2015  [name] 04-Apr-1956 [id number]  High Risk  RN attempted to reach this pt however only able to leave a HIPAA approved voice message requesting a call back. Will further inquired on needed resources at that time.  [name], RN Care Management Coordinator [place] Office [phone]

## 2015-11-01 ENCOUNTER — Other Ambulatory Visit: Payer: Self-pay | Admitting: *Deleted

## 2015-11-01 NOTE — Patient Outreach (Signed)
[facility]) Care Management  11/01/2015  MASAKO PICCININI 09-21-1955 [id number]  Unsuccessful third attempt to reach pt for inquire. Will close case based upon no contacts returned with calls previously made.   [name], RN Care Management Coordinator [place] Office [phone]

## 2015-11-07 MED FILL — DARIFENACIN ER 15 MG TABLET: 15 | 30 days supply | Qty: 30 | Fill #8

## 2015-11-08 DIAGNOSIS — M1711 Unilateral primary osteoarthritis, right knee: Secondary | ICD-10-CM | POA: Diagnosis not present

## 2015-11-09 ENCOUNTER — Ambulatory Visit: Payer: Self-pay | Admitting: Family Medicine

## 2015-11-12 DIAGNOSIS — C50412 Malignant neoplasm of upper-outer quadrant of left female breast: Secondary | ICD-10-CM | POA: Diagnosis not present

## 2015-11-12 DIAGNOSIS — Z7901 Long term (current) use of anticoagulants: Secondary | ICD-10-CM | POA: Diagnosis not present

## 2015-11-12 DIAGNOSIS — Z5112 Encounter for antineoplastic immunotherapy: Secondary | ICD-10-CM | POA: Diagnosis not present

## 2015-11-12 DIAGNOSIS — C792 Secondary malignant neoplasm of skin: Secondary | ICD-10-CM | POA: Diagnosis not present

## 2015-11-12 DIAGNOSIS — Z79899 Other long term (current) drug therapy: Secondary | ICD-10-CM | POA: Diagnosis not present

## 2015-11-13 MED FILL — ZOLPIDEM TARTRATE 5 MG TAB: 5 | 30 days supply | Qty: 30 | Fill #2

## 2015-11-19 MED FILL — ELIQUIS 5 MG TABLET: 5 | 30 days supply | Qty: 60 | Fill #6

## 2015-11-20 ENCOUNTER — Telehealth: Payer: Self-pay | Admitting: General Practice

## 2015-11-20 MED ORDER — GABAPENTIN 300 MG PO CAPS: ORAL_CAPSULE | ORAL | Status: DC

## 2015-11-20 MED FILL — GABAPENTIN 300 MG CAPSULE: 300 | 30 days supply | Qty: 90 | Fill #0

## 2015-11-20 NOTE — Telephone Encounter (Signed)
Please advise we received a refill request in regards to this patient and she has never been seen by you. Last OV was 05/2015 with Dr. Shawna Orleans. Appt to establish is not until July 20/2017

## 2015-11-20 NOTE — Telephone Encounter (Signed)
Med filled and message routed to Office Manager to inform on the refills.

## 2015-11-20 NOTE — Telephone Encounter (Signed)
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

## 2015-12-03 DIAGNOSIS — Z79899 Other long term (current) drug therapy: Secondary | ICD-10-CM | POA: Diagnosis not present

## 2015-12-03 DIAGNOSIS — Z171 Estrogen receptor negative status [ER-]: Secondary | ICD-10-CM | POA: Diagnosis not present

## 2015-12-03 DIAGNOSIS — C792 Secondary malignant neoplasm of skin: Secondary | ICD-10-CM | POA: Diagnosis not present

## 2015-12-03 DIAGNOSIS — Z5112 Encounter for antineoplastic immunotherapy: Secondary | ICD-10-CM | POA: Diagnosis not present

## 2015-12-03 DIAGNOSIS — C50412 Malignant neoplasm of upper-outer quadrant of left female breast: Secondary | ICD-10-CM | POA: Diagnosis not present

## 2015-12-03 DIAGNOSIS — Z5181 Encounter for therapeutic drug level monitoring: Secondary | ICD-10-CM | POA: Diagnosis not present

## 2015-12-04 DIAGNOSIS — M1711 Unilateral primary osteoarthritis, right knee: Secondary | ICD-10-CM | POA: Diagnosis not present

## 2015-12-04 DIAGNOSIS — M7061 Trochanteric bursitis, right hip: Secondary | ICD-10-CM | POA: Diagnosis not present

## 2015-12-05 MED FILL — traMADol HCL 50 MG TABS: 50 | 30 days supply | Qty: 90 | Fill #0

## 2015-12-10 MED FILL — DARIFENACIN ER 15 MG TABLET: 15 | 30 days supply | Qty: 30 | Fill #0

## 2015-12-12 ENCOUNTER — Ambulatory Visit: Payer: 59 | Admitting: Family Medicine

## 2015-12-12 ENCOUNTER — Other Ambulatory Visit: Payer: Self-pay | Admitting: Internal Medicine

## 2015-12-13 ENCOUNTER — Ambulatory Visit: Payer: Self-pay | Admitting: Family Medicine

## 2015-12-13 MED FILL — CELECOXIB 200 MG CAPSULE: 200 | 30 days supply | Qty: 60 | Fill #4

## 2015-12-13 MED FILL — ZOLPIDEM TARTRATE 5 MG TAB: 5 | 30 days supply | Qty: 30 | Fill #0

## 2015-12-13 NOTE — Telephone Encounter (Signed)
Medication filled to pharmacy as requested.   

## 2015-12-13 NOTE — Telephone Encounter (Signed)
Pt has not seen you yet. We filled the gabapentin due to this same problem.  Pt last seen by Dr. Shawna Orleans on 06/06/15 Zolpidem last filled 09/11/15 #30 with 2

## 2015-12-21 DIAGNOSIS — F4323 Adjustment disorder with mixed anxiety and depressed mood: Secondary | ICD-10-CM | POA: Diagnosis not present

## 2015-12-24 DIAGNOSIS — Z79899 Other long term (current) drug therapy: Secondary | ICD-10-CM | POA: Diagnosis not present

## 2015-12-24 DIAGNOSIS — Z5112 Encounter for antineoplastic immunotherapy: Secondary | ICD-10-CM | POA: Diagnosis not present

## 2015-12-24 DIAGNOSIS — Z9013 Acquired absence of bilateral breasts and nipples: Secondary | ICD-10-CM | POA: Diagnosis not present

## 2015-12-24 DIAGNOSIS — Z923 Personal history of irradiation: Secondary | ICD-10-CM | POA: Diagnosis not present

## 2015-12-24 DIAGNOSIS — C50412 Malignant neoplasm of upper-outer quadrant of left female breast: Secondary | ICD-10-CM | POA: Diagnosis not present

## 2015-12-24 DIAGNOSIS — C792 Secondary malignant neoplasm of skin: Secondary | ICD-10-CM | POA: Diagnosis not present

## 2015-12-24 DIAGNOSIS — Z171 Estrogen receptor negative status [ER-]: Secondary | ICD-10-CM | POA: Diagnosis not present

## 2015-12-24 DIAGNOSIS — Z7901 Long term (current) use of anticoagulants: Secondary | ICD-10-CM | POA: Diagnosis not present

## 2015-12-24 MED FILL — ELIQUIS 5 MG TABLET: 5 | 30 days supply | Qty: 60 | Fill #7

## 2016-01-07 MED FILL — GABAPENTIN 300 MG CAPSULE: 300 | 30 days supply | Qty: 90 | Fill #1

## 2016-01-07 MED FILL — DARIFENACIN ER 15 MG TABLET: 15 | 30 days supply | Qty: 30 | Fill #1

## 2016-01-09 DIAGNOSIS — M1711 Unilateral primary osteoarthritis, right knee: Secondary | ICD-10-CM | POA: Diagnosis not present

## 2016-01-14 DIAGNOSIS — Z5112 Encounter for antineoplastic immunotherapy: Secondary | ICD-10-CM | POA: Diagnosis not present

## 2016-01-14 DIAGNOSIS — C792 Secondary malignant neoplasm of skin: Secondary | ICD-10-CM | POA: Diagnosis not present

## 2016-01-14 DIAGNOSIS — C50412 Malignant neoplasm of upper-outer quadrant of left female breast: Secondary | ICD-10-CM | POA: Diagnosis not present

## 2016-01-15 MED FILL — ZOLPIDEM TARTRATE 5 MG TAB: 5 | 30 days supply | Qty: 30 | Fill #1

## 2016-01-17 DIAGNOSIS — M1711 Unilateral primary osteoarthritis, right knee: Secondary | ICD-10-CM | POA: Diagnosis not present

## 2016-01-17 MED FILL — traMADol HCL 50 MG TABS: 50 | 30 days supply | Qty: 90 | Fill #1

## 2016-01-21 MED FILL — ELIQUIS 5 MG TABLET: 5 | 30 days supply | Qty: 60 | Fill #8

## 2016-01-22 ENCOUNTER — Encounter: Payer: Self-pay | Admitting: Emergency Medicine

## 2016-01-25 ENCOUNTER — Ambulatory Visit (INDEPENDENT_AMBULATORY_CARE_PROVIDER_SITE_OTHER): Payer: 59 | Admitting: Family Medicine

## 2016-01-25 ENCOUNTER — Encounter: Payer: Self-pay | Admitting: Family Medicine

## 2016-01-25 VITALS — BP 120/73 | HR 91 | Temp 98.1°F | Resp 16 | Ht 68.0 in | Wt 162.5 lb

## 2016-01-25 DIAGNOSIS — G620 Drug-induced polyneuropathy: Secondary | ICD-10-CM

## 2016-01-25 DIAGNOSIS — R000 Tachycardia, unspecified: Secondary | ICD-10-CM

## 2016-01-25 DIAGNOSIS — C7989 Secondary malignant neoplasm of other specified sites: Secondary | ICD-10-CM

## 2016-01-25 DIAGNOSIS — T451X5A Adverse effect of antineoplastic and immunosuppressive drugs, initial encounter: Secondary | ICD-10-CM | POA: Diagnosis not present

## 2016-01-25 DIAGNOSIS — E559 Vitamin D deficiency, unspecified: Secondary | ICD-10-CM

## 2016-01-25 DIAGNOSIS — C50912 Malignant neoplasm of unspecified site of left female breast: Secondary | ICD-10-CM | POA: Diagnosis not present

## 2016-01-25 DIAGNOSIS — Z13220 Encounter for screening for lipoid disorders: Secondary | ICD-10-CM

## 2016-01-25 DIAGNOSIS — M1711 Unilateral primary osteoarthritis, right knee: Secondary | ICD-10-CM

## 2016-01-25 LAB — LIPID PANEL
CHOL/HDL RATIO: 3
CHOLESTEROL: 236 mg/dL — AB (ref 0–200)
HDL: 78.1 mg/dL (ref >39.00)
LDL CALC: 146 mg/dL — AB (ref 0–99)
NonHDL: 157.55
TRIGLYCERIDES: 58 mg/dL (ref 0.0–149.0)
VLDL: 11.6 mg/dL (ref 0.0–40.0)

## 2016-01-25 LAB — VITAMIN D 25 HYDROXY (VIT D DEFICIENCY, FRACTURES): VITD: 36.99 ng/mL (ref 30.00–100.00)

## 2016-01-25 LAB — HEPATIC FUNCTION PANEL
ALBUMIN: 3.9 g/dL (ref 3.5–5.2)
ALT: 40 U/L — AB (ref 0–35)
AST: 76 U/L — AB (ref 0–37)
Alkaline Phosphatase: 150 U/L — ABNORMAL HIGH (ref 39–117)
Bilirubin, Direct: 0.3 mg/dL (ref 0.0–0.3)
TOTAL PROTEIN: 7.1 g/dL (ref 6.0–8.3)
Total Bilirubin: 1.4 mg/dL — ABNORMAL HIGH (ref 0.2–1.2)

## 2016-01-25 LAB — TSH: TSH: 1.98 u[IU]/mL (ref 0.35–4.50)

## 2016-01-25 NOTE — Assessment & Plan Note (Signed)
Chronic problem.  Following w/ Dr Philipp Ovens at [facility].  Continues to get Chemo infusion every 3 weeks.  ECHOs twice yearly w/ Dr Haroldine Laws.  Will continue to follow along

## 2016-01-25 NOTE — Progress Notes (Signed)
Pre visit review using our clinic review tool, if applicable. No additional management support is needed unless otherwise documented below in the visit note. 

## 2016-01-25 NOTE — Assessment & Plan Note (Signed)
Chronic problem.  Pt has end stage R knee OA and is pursuing TKR w/ Dr Maxie Better for later this fall.  Will follow along and assist as able.

## 2016-01-25 NOTE — Assessment & Plan Note (Signed)
Chronic problem.  Following w/ Dr Philipp Ovens at [facility]

## 2016-01-25 NOTE — Patient Instructions (Signed)
Schedule your complete physical in 6 months We'll notify you of your lab results and make any changes if needed Complete the cologuard and return as directed Keep up the good work!  You look great!!! Call with any questions or concerns Welcome!  We're glad to have you!!!

## 2016-01-25 NOTE — Progress Notes (Signed)
   Subjective:    Patient ID: [name], female    DOB: 31-Jul-1955, 60 y.o.   MRN: [id number]  HPI New to establish.  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  S/p double mastectomy.  + chemo induced neuropathy.  On gabapentin.  Getting twice yearly ECHOs w/ Dr Linna Hoff  Chronic anticoagulation- on Eliquis due to Port-A-Cath clot.  No abnormal bleeding.  + bruising w/ minimal contact.  OAB- chronic problem, on Enablex w/ Dr Radene Knee.  Some improvement in OAB sxs w/ medication- more effective in AM than PM.  Severe OA- R knee in need of replacement.  Following w/ Dr Tonita Cong.  Pt is not able to walk regularly which she enjoys and she is looking to move forward w/ surgery.  Vit D deficiency- pt has hx of this after starting Chemo.  Did high dose replacement tx but this was never repeated.  Health Maintenance- pt has never had colonoscopy.  Pt plans to get flu shot at work.  UTD on DEXA.   Review of Systems For ROS see HPI     Objective:   Physical Exam  Constitutional: She is oriented to person, place, and time. She appears well-developed and well-nourished. No distress.  HENT:  Head: Normocephalic and atraumatic.  Eyes: Conjunctivae and EOM are normal. Pupils are equal, round, and reactive to light.  Neck: Normal range of motion. Neck supple. No thyromegaly present.  Cardiovascular: Normal rate, regular rhythm and intact distal pulses.   Murmur (II-III/VI SEM heard best over LUSB) heard. Pulmonary/Chest: Effort normal and breath sounds normal. No respiratory distress.  Abdominal: Soft. She exhibits no distension. There is no tenderness.  Musculoskeletal: She exhibits edema (nonpitting edema bilaterally).  Lymphadenopathy:    She has no cervical adenopathy.  Neurological: She is alert and oriented to person, place, and time.  Skin: Skin is warm and dry.  Psychiatric: She has a normal mood and affect.  Her behavior is normal.  Vitals reviewed.         Assessment & Plan:

## 2016-01-25 NOTE — Assessment & Plan Note (Signed)
Chronic problem.  Occurs intermittently.  Pt feels there is a relation to her chemo.  Currently asymptomatic.  HR rarely exceeds 110s.  Not on medication at this time.  Check TSH.  Will follow.

## 2016-01-25 NOTE — Assessment & Plan Note (Signed)
Pt has hx of similar.  Has not had labs repeated recently.  Check level and replete prn.

## 2016-01-25 NOTE — Assessment & Plan Note (Signed)
Chronic problem.  Following w/ Dr Philipp Ovens at [facility].  Taking Gabapentin for pain.  Will follow along and assist as able

## 2016-01-29 DIAGNOSIS — S8002XA Contusion of left knee, initial encounter: Secondary | ICD-10-CM | POA: Diagnosis not present

## 2016-01-31 DIAGNOSIS — Z1212 Encounter for screening for malignant neoplasm of rectum: Secondary | ICD-10-CM | POA: Diagnosis not present

## 2016-01-31 DIAGNOSIS — Z1211 Encounter for screening for malignant neoplasm of colon: Secondary | ICD-10-CM | POA: Diagnosis not present

## 2016-01-31 LAB — COLOGUARD: Cologuard: NEGATIVE

## 2016-02-01 DIAGNOSIS — Z9013 Acquired absence of bilateral breasts and nipples: Secondary | ICD-10-CM | POA: Diagnosis not present

## 2016-02-01 DIAGNOSIS — J9811 Atelectasis: Secondary | ICD-10-CM | POA: Diagnosis not present

## 2016-02-01 DIAGNOSIS — Z171 Estrogen receptor negative status [ER-]: Secondary | ICD-10-CM | POA: Diagnosis not present

## 2016-02-01 DIAGNOSIS — C50911 Malignant neoplasm of unspecified site of right female breast: Secondary | ICD-10-CM | POA: Diagnosis not present

## 2016-02-04 DIAGNOSIS — Z171 Estrogen receptor negative status [ER-]: Secondary | ICD-10-CM | POA: Diagnosis not present

## 2016-02-04 DIAGNOSIS — C50912 Malignant neoplasm of unspecified site of left female breast: Secondary | ICD-10-CM | POA: Diagnosis not present

## 2016-02-04 DIAGNOSIS — C50412 Malignant neoplasm of upper-outer quadrant of left female breast: Secondary | ICD-10-CM | POA: Diagnosis not present

## 2016-02-04 DIAGNOSIS — C792 Secondary malignant neoplasm of skin: Secondary | ICD-10-CM | POA: Diagnosis not present

## 2016-02-04 DIAGNOSIS — C50919 Malignant neoplasm of unspecified site of unspecified female breast: Secondary | ICD-10-CM | POA: Diagnosis not present

## 2016-02-04 DIAGNOSIS — Z5112 Encounter for antineoplastic immunotherapy: Secondary | ICD-10-CM | POA: Diagnosis not present

## 2016-02-05 ENCOUNTER — Ambulatory Visit: Payer: 59 | Attending: Specialist

## 2016-02-05 DIAGNOSIS — M6281 Muscle weakness (generalized): Secondary | ICD-10-CM | POA: Insufficient documentation

## 2016-02-05 DIAGNOSIS — R2689 Other abnormalities of gait and mobility: Secondary | ICD-10-CM | POA: Insufficient documentation

## 2016-02-05 DIAGNOSIS — M25562 Pain in left knee: Secondary | ICD-10-CM | POA: Diagnosis not present

## 2016-02-05 DIAGNOSIS — M25662 Stiffness of left knee, not elsewhere classified: Secondary | ICD-10-CM | POA: Insufficient documentation

## 2016-02-05 DIAGNOSIS — R600 Localized edema: Secondary | ICD-10-CM | POA: Diagnosis not present

## 2016-02-05 DIAGNOSIS — S8002XD Contusion of left knee, subsequent encounter: Secondary | ICD-10-CM | POA: Diagnosis not present

## 2016-02-05 NOTE — Patient Instructions (Addendum)
Ankle Pump    With left leg elevated, gently flex and extend ankle. Move through full range of motion. Avoid pain. Repeat __20__ times per set. Do ____ sets per session. Do _many___ sessions per day.  http://orth.exer.us/33   Copyright  VHI. All rights reserved.  HIP / KNEE: Flexion, Heel Slides - Supine    Slide heel up toward buttocks, keeping leg in straight line. _10__ reps per set, 3-5___ sets per day, ___ days per week Use towel or pillowcase under heel as needed.  Copyright  VHI. All rights reserved.  Supine: Leg Stretch With Strap (Basic)    Lie on back with one knee bent, foot flat on floor. Hook strap around other foot. Straighten knee. Keep knee level with other knee. Hold _15__ seconds. Relax leg completely down to floor.  Repeat _3__ times per session. Do _3__ sessions per day.  Copyright  VHI. All rights reserved.  KNEE: Extension, Long Arc Quads - Sitting    Raise leg until knee is straight.  Hold 5 seconds _10__ reps per set, _3-5__ sets per day, ___ days per week  Copyright  VHI. All rights reserved.  [place] [address] [place], [place] [address] Phone # [phone] Fax [phone]

## 2016-02-05 NOTE — Therapy (Signed)
[facility] [address] [place], [place], [address] Phone: [phone]   Fax:  [phone]  Physical Therapy Evaluation  Patient Details  Name: [name] MRN: [id number] Date of Birth: [birth year] Referring Provider: Susa Day, MD  Encounter Date: 02/05/2016      PT End of Session - 02/05/16 1254    Visit Number 1   Date for PT Re-Evaluation 04/01/16   PT Start Time 1228   PT Stop Time 1308   PT Time Calculation (min) 40 min   Activity Tolerance Patient tolerated treatment well   Behavior During Therapy [facility] for tasks assessed/performed      Past Medical History:  Diagnosis Date  . 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 Arthritis KNEES  . Chronic anticoagulation HX  PORT-A-CATH CLOT   2010 - HAS BEEN ON LOVENOX SINCE THE CLOT  . Elevated blood pressure reading without diagnosis of hypertension    PT MONITORS HER B/P AT HOME  . 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 Skin cancer of anterior chest BREAST CANCER PRIMARY W/ METS TO CHEST WALL SKIN CANCER--  CHEMOTHERAPY EVERY 3 WEEKS AT [facility]    Past Surgical History:  Procedure Laterality Date  . CHEST WALL TUMOR EXCISION    . KNEE ARTHROSCOPY  02/13/2012   Procedure: ARTHROSCOPY KNEE;  Surgeon: Johnn Hai, MD;  Location: [facility];  Service: Orthopedics;  Laterality: Left;  WITH DEBRIDEMENt   . KNEE ARTHROSCOPY WITH LATERAL MENISECTOMY  02/13/2012   Procedure: KNEE ARTHROSCOPY WITH LATERAL MENISECTOMY;  Surgeon: Johnn Hai, MD;  Location: [facility];  Service: Orthopedics;;  partial  . LEFT MODIFIED RADICAL MASTECTOMY/ RIGHT TOTAL MASTECTOMY  11-13-2007   LEFT BREAST CANCER W/ AXILLARY LYMPH NODE METASTASIS AND POST NEOADJUVANT CHEMO  . PLACEMENT PORT-A-CATH  06-22-2007  .  SKIN GRAFT     chest-post tumor removal  . TOTAL KNEE ARTHROPLASTY Left 09/23/2012   Procedure: LEFT TOTAL KNEE ARTHROPLASTY;  Surgeon: Johnn Hai, MD;  Location: [facility];  Service: Orthopedics;  Laterality: Left;  . TRANSTHORACIC ECHOCARDIOGRAM  11-18-2011  DR BENSIMHON (ECHO EVERY 3 MONTHS)   HX CHEMO INDUCED CARDIOTOXICITY/  LVSF NORMAL/ EF 55-50%/ MILD MITRIAL VALVE REGURG./ MILDLY INCREASED SYSTOLIC PRESSURE OF PULMONARY ARTERIES    There were no vitals filed for this visit.       Subjective Assessment - 02/05/16 1227    Subjective Pt presents to PT s/p fall onto Lt knee that occurred 01/25/16.  Pt reports that she tripped on her Rt foot that was exernally rotated.  she walks this way due to Rt knee pain (needs replacement).  Pt with resultant edema, brusing and blood clot as pt is on blood thinners.     Pertinent History breast cancer, pt is on blood thinners, Lt TKA 2014, allergy to most tape   How long can you stand comfortably? limited by MD   How long can you walk comfortably? limited by MD   Diagnostic tests x-ray: after fall.  X-ray is intact   Patient Stated Goals reduced Lt knee edema and bruising, reduce pain, improve mobility and return to prior level so she can return to work.     Currently in Pain? Yes   Pain Score 2    Pain Location Knee  Pain Orientation Left   Pain Descriptors / Indicators Sore   Pain Type Acute pain   Pain Onset 1 to 4 weeks ago   Pain Frequency Constant   Aggravating Factors  standing/walking, sitting too long   Pain Relieving Factors ice, change of position            [facility] PT Assessment - 02/05/16 0001      Assessment   Medical Diagnosis Contusion of Lt knee, s/p total knee replacement, Lt   Referring Provider Susa Day, MD   Next MD Visit 02/19/16   Prior Therapy fater total knee replacement     Precautions   Precautions Other (comment)  history of cancer     Restrictions   Weight Bearing Restrictions No     Balance  Screen   Has the patient fallen in the past 6 months Yes   How many times? 1   Has the patient had a decrease in activity level because of a fear of falling?  No   Is the patient reluctant to leave their home because of a fear of falling?  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   Leisure reading,gardening     Cognition   Overall Cognitive Status Within Functional Limits for tasks assessed     Observation/Other Assessments   Skin Integrity ecchemosis Lt calf, hamstrings and anterior knee joint   Focus on Therapeutic Outcomes (FOTO)  45% limitation     Circumferential Edema   Circumferential - Right mid patella: 17, mid calf: 14.75   Circumferential - Left  Mid calf: 15.5 inches, Mid patella: 18 inches     Posture/Postural Control   Posture/Postural Control Postural limitations   Posture Comments Rt knee genu valgus with ER at hip     ROM / Strength   AROM / PROM / Strength AROM;PROM;Strength     AROM   Overall AROM  Deficits   AROM Assessment Site Knee   Right/Left Knee Right;Left   Right Knee Extension 15   Right Knee Flexion 90   Left Knee Extension 5   Left Knee Flexion 90     PROM   Overall PROM  Deficits   Overall PROM Comments edema and bruising/ecchymosis present so didn't apply overpressue     Strength   Overall Strength Deficits   Overall Strength Comments Bil hip flexion 4/5, Lt knee 4/5, Rt knee 4+/5     Flexibility   Soft Tissue Assessment /Muscle Length yes     Ambulation/Gait   Ambulation/Gait Yes   Ambulation/Gait Assistance 6: Modified independent (Device/Increase time)   Ambulation Distance (Feet) 100 Feet   Gait Pattern Step-through pattern;Decreased stance time - left;Decreased weight shift to left;Abducted- right  ER at right hip                   [facility] Adult PT Treatment/Exercise - 02/05/16 0001      Exercises   Exercises  Knee/Hip     Modalities   Modalities Vasopneumatic     Vasopneumatic   Number Minutes Vasopneumatic  15 minutes   Vasopnuematic Location  Knee   Vasopneumatic Pressure Medium   Vasopneumatic Temperature  3 snowflakes                PT Education - 02/05/16 1254    Education provided Yes   Education Details HEP: ankle pumps,  heel slides, hamstring stretch, long arc quads   Person(s) Educated Patient   Methods Explanation;Demonstration;Handout   Comprehension Verbalized understanding;Returned demonstration          PT Short Term Goals - 02/05/16 1301      PT SHORT TERM GOAL #1   Title be independent in initial HEP   Time 4   Period Weeks   Status New     PT SHORT TERM GOAL #2   Title reduced Lt LE edema: mid patella < or = to 17.5 inches, mid calf < or = to 15.25  inches to allow for return to work   Time 4   Period Weeks   Status New     PT SHORT TERM GOAL #3   Title demonstrate Lt knee AROM flexion to > or = to 100 degrees   Time 4   Period Weeks   Status New           PT Long Term Goals - 02/05/16 1203      PT LONG TERM GOAL #1   Title be independent in advanced HEP   Time 8   Period Weeks   Status New     PT LONG TERM GOAL #2   Title reduce FOTO to < or = to 32% limitation   Time 8   Period Weeks   Status New     PT LONG TERM GOAL #3   Title reduced Lt LE edema: mid patella < or = to 17.25 inches, mid calf < or = to 15 inches to allow for return to work   Time 8   Period Weeks   Status New     PT LONG TERM GOAL #4   Title return to standing and walking without limitaiton to allow for return to work   Time 8   Period Weeks   Status New     PT LONG TERM GOAL #5   Title report 50% reduction in LT knee pain with standing and walking   Time 8   Period Weeks   Status New     Additional Long Term Goals   Additional Long Term Goals Yes     PT LONG TERM GOAL #6   Title improve Lt knee AROM flexion to > or = to 110 degrees to improve  squatting and sitting   Time 8   Period Weeks   Status New               Plan - 02/05/16 1255    Clinical Impression Statement Pt presents to PT with significant Lt knee edema, bruising and ecchymosis ( due to fall sustained 01/25/16. Pt had a fall landing on her Lt knee as she externally rotates at her Rt hip and has significant valgus and tripped on her leg.  Pt had total knee replacement on the Lt 3 years ago and needs to have Rt knee replaced soon.  Pt demonstrates edema, brusing and limited AROM of the Lt knee with gait abnormality.  Pt is of moderate complexity evaluation due to complicated medical history including cancer, taking blood thinners, and s/p Lt TKA and significant OA in the Rt knee.  Pt will benefit from skilled PT to reduce edema, improve gait, and AROM to allow for return to work and prior level of function.     Rehab Potential Good   PT Frequency 2x / week   PT Duration 8 weeks   PT Treatment/Interventions ADLs/Self Care Home Management;Cryotherapy;Electrical Stimulation;Functional mobility  training;Stair training;Gait training;Moist Heat;Therapeutic activities;Therapeutic exercise;Balance training;Neuromuscular re-education;Patient/family education;Passive range of motion;Manual techniques;Vasopneumatic Device   PT Next Visit Plan Edema management, Lt knee AROM, strength, gait.  Primary focus to reduce edema and improve mobility   Consulted and Agree with Plan of Care Patient      Patient will benefit from skilled therapeutic intervention in order to improve the following deficits and impairments:  Difficulty walking, Decreased range of motion, Pain, Increased edema, Decreased strength, Decreased mobility, Impaired flexibility, Decreased activity tolerance, Decreased endurance  Visit Diagnosis: Pain in left knee - Plan: PT plan of care cert/re-cert  Stiffness of left knee, not elsewhere classified - Plan: PT plan of care cert/re-cert  Muscle weakness (generalized)  - Plan: PT plan of care cert/re-cert  Other abnormalities of gait and mobility - Plan: PT plan of care cert/re-cert  Localized edema - Plan: PT plan of care cert/re-cert     Problem List Patient Active Problem List   Diagnosis Date Noted  . Vitamin D deficiency 01/25/2016  . Metastatic cancer to chest wall ([place]) 02/02/2015  . Fever and chills 02/01/2015  . Osteoarthritis 02/01/2015  . Anemia 02/01/2015  . Frequent UTI 01/12/2014  . Sinus tachycardia ([place]) 11/03/2013  . DOE (dyspnea on exertion) 11/03/2013  . Postoperative anemia due to acute blood loss 12/24/2012  .    09/23/2012  . Sinusitis 07/28/2012  . 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 Breast cancer, left breast ([place]) 11/12/2010  . Chronic anticoagulation 11/12/2010  . Anxiety, generalized 03/26/2008     Sigurd Sos, PT 02/05/16 1:10 PM  [facility] [address], [place], [place], [address] Phone: [phone]   Fax:  [phone]  Name: KIMYATA MILICH MRN: [id number] Date of Birth: May 24, 1956

## 2016-02-07 ENCOUNTER — Ambulatory Visit: Payer: 59 | Admitting: Physical Therapy

## 2016-02-07 ENCOUNTER — Encounter: Payer: Self-pay | Admitting: Physical Therapy

## 2016-02-07 DIAGNOSIS — R2689 Other abnormalities of gait and mobility: Secondary | ICD-10-CM | POA: Diagnosis not present

## 2016-02-07 DIAGNOSIS — M6281 Muscle weakness (generalized): Secondary | ICD-10-CM | POA: Diagnosis not present

## 2016-02-07 DIAGNOSIS — M25562 Pain in left knee: Secondary | ICD-10-CM

## 2016-02-07 DIAGNOSIS — M25662 Stiffness of left knee, not elsewhere classified: Secondary | ICD-10-CM | POA: Diagnosis not present

## 2016-02-07 DIAGNOSIS — R600 Localized edema: Secondary | ICD-10-CM | POA: Diagnosis not present

## 2016-02-07 NOTE — Therapy (Signed)
[facility] [address], [place], [place], [address] Phone: [phone]   Fax:  [phone]  Physical Therapy Treatment  Patient Details  Name: [name] MRN: [id number] Date of Birth: 12-Feb-1956 Referring Provider: Susa Day, MD  Encounter Date: 02/07/2016      PT End of Session - 02/07/16 1615    Visit Number 2   Date for PT Re-Evaluation 04/01/16   PT Start Time 3710   PT Stop Time 1628   PT Time Calculation (min) 58 min   Activity Tolerance Patient tolerated treatment well   Behavior During Therapy [facility] for tasks assessed/performed      Past Medical History:  Diagnosis Date  . 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 Arthritis KNEES  . Chronic anticoagulation HX  PORT-A-CATH CLOT   2010 - HAS BEEN ON LOVENOX SINCE THE CLOT  . Elevated blood pressure reading without diagnosis of hypertension    PT MONITORS HER B/P AT HOME  . 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 Skin cancer of anterior chest BREAST CANCER PRIMARY W/ METS TO CHEST WALL SKIN CANCER--  CHEMOTHERAPY EVERY 3 WEEKS AT [facility]    Past Surgical History:  Procedure Laterality Date  . CHEST WALL TUMOR EXCISION    . KNEE ARTHROSCOPY  02/13/2012   Procedure: ARTHROSCOPY KNEE;  Surgeon: Johnn Hai, MD;  Location: [facility];  Service: Orthopedics;  Laterality: Left;  WITH DEBRIDEMENt   . KNEE ARTHROSCOPY WITH LATERAL MENISECTOMY  02/13/2012   Procedure: KNEE ARTHROSCOPY WITH LATERAL MENISECTOMY;  Surgeon: Johnn Hai, MD;  Location: [place];  Service: Orthopedics;;  partial  . LEFT MODIFIED RADICAL MASTECTOMY/ RIGHT TOTAL MASTECTOMY  11-13-2007   LEFT BREAST CANCER W/ AXILLARY LYMPH NODE METASTASIS AND POST NEOADJUVANT CHEMO  . PLACEMENT PORT-A-CATH  06-22-2007  .  SKIN GRAFT     chest-post tumor removal  . TOTAL KNEE ARTHROPLASTY Left 09/23/2012   Procedure: LEFT TOTAL KNEE ARTHROPLASTY;  Surgeon: Johnn Hai, MD;  Location: [facility];  Service: Orthopedics;  Laterality: Left;  . TRANSTHORACIC ECHOCARDIOGRAM  11-18-2011  DR BENSIMHON (ECHO EVERY 3 MONTHS)   HX CHEMO INDUCED CARDIOTOXICITY/  LVSF NORMAL/ EF 55-50%/ MILD MITRIAL VALVE REGURG./ MILDLY INCREASED SYSTOLIC PRESSURE OF PULMONARY ARTERIES    There were no vitals filed for this visit.      Subjective Assessment - 02/07/16 1531    Subjective Pt presents with antalgic gait Bil due to pain. Pt has been comliant with home exercises and has been keeping knee elevated as often as possible.    Pertinent History breast cancer, pt is on blood thinners, Lt TKA 2014, allergy to most tape   How long can you stand comfortably? limited by MD   How long can you walk comfortably? limited by MD   Diagnostic tests x-ray: after fall.  X-ray is intact   Patient Stated Goals reduced Lt knee edema and bruising, reduce pain, improve mobility and return to prior level so she can return to work.     Currently in Pain? Yes   Pain Score 3    Pain Location Knee   Pain Orientation Left   Pain Descriptors / Indicators Sore;Burning   Pain Type Acute pain   Pain Onset 1 to 4 weeks ago  Pain Frequency Constant                         [facility] Adult PT Treatment/Exercise - 02/07/16 0001      Knee/Hip Exercises: Aerobic   Nustep L1 x6 minutes     Knee/Hip Exercises: Standing   Heel Raises Both;2 sets;10 reps   Knee Flexion Strengthening;2 sets;10 reps   Hip Flexion Stengthening;Both;2 sets;10 reps   Hip Abduction Stengthening;Both;2 sets;10 reps     Knee/Hip Exercises: Supine   Heel Slides AROM;Strengthening;Both;2 sets;10 reps   Straight Leg Raises AROM;Strengthening;2 sets;10 reps     Modalities   Modalities Vasopneumatic     Vasopneumatic   Number Minutes Vasopneumatic  15 minutes    Vasopnuematic Location  Knee   Vasopneumatic Pressure Medium   Vasopneumatic Temperature  3 snowflakes     Manual Therapy   Manual Therapy Edema management   Manual therapy comments Supine with legs propped   Edema Management Edema massage to Lt LE to increase circulationand decrease edema                  PT Short Term Goals - 02/05/16 1301      PT SHORT TERM GOAL #1   Title be independent in initial HEP   Time 4   Period Weeks   Status New     PT SHORT TERM GOAL #2   Title reduced Lt LE edema: mid patella < or = to 17.5 inches, mid calf < or = to 15.25  inches to allow for return to work   Time 4   Period Weeks   Status New     PT SHORT TERM GOAL #3   Title demonstrate Lt knee AROM flexion to > or = to 100 degrees   Time 4   Period Weeks   Status New           PT Long Term Goals - 02/05/16 1203      PT LONG TERM GOAL #1   Title be independent in advanced HEP   Time 8   Period Weeks   Status New     PT LONG TERM GOAL #2   Title reduce FOTO to < or = to 32% limitation   Time 8   Period Weeks   Status New     PT LONG TERM GOAL #3   Title reduced Lt LE edema: mid patella < or = to 17.25 inches, mid calf < or = to 15 inches to allow for return to work   Time 8   Period Weeks   Status New     PT LONG TERM GOAL #4   Title return to standing and walking without limitaiton to allow for return to work   Time 8   Period Weeks   Status New     PT LONG TERM GOAL #5   Title report 50% reduction in LT knee pain with standing and walking   Time 8   Period Weeks   Status New     Additional Long Term Goals   Additional Long Term Goals Yes     PT LONG TERM GOAL #6   Title improve Lt knee AROM flexion to > or = to 110 degrees to improve squatting and sitting   Time 8   Period Weeks   Status New               Plan - 02/07/16 1618  Clinical Impression Statement Pt presents with significant LT knee edema, bruising, and ecchymosis. Able to  tolerate full weight bearing through [place]. Able to tolerate edema massage to Lt LE to increase blood circulationand stimulate lymphatic drainage. Will continue to increase strengthn and ROM in LE and decrease edema.     Rehab Potential Good   PT Frequency 2x / week   PT Duration 8 weeks   PT Treatment/Interventions ADLs/Self Care Home Management;Cryotherapy;Electrical Stimulation;Functional mobility training;Stair training;Gait training;Moist Heat;Therapeutic activities;Therapeutic exercise;Balance training;Neuromuscular re-education;Patient/family education;Passive range of motion;Manual techniques;Vasopneumatic Device   PT Next Visit Plan Edema management, Lt knee AROM, strength, gait.  Primary focus to reduce edema and improve mobility   Consulted and Agree with Plan of Care Patient      Patient will benefit from skilled therapeutic intervention in order to improve the following deficits and impairments:  Difficulty walking, Decreased range of motion, Pain, Increased edema, Decreased strength, Decreased mobility, Impaired flexibility, Decreased activity tolerance, Decreased endurance  Visit Diagnosis: Pain in left knee  Stiffness of left knee, not elsewhere classified  Muscle weakness (generalized)  Other abnormalities of gait and mobility  Localized edema     Problem List Patient Active Problem List   Diagnosis Date Noted  . Vitamin D deficiency 01/25/2016  . 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 Fever and chills 02/01/2015  . Osteoarthritis 02/01/2015  . Anemia 02/01/2015  . Frequent UTI 01/12/2014  . Sinus tachycardia ([place]) 11/03/2013  . DOE (dyspnea on exertion) 11/03/2013  . Postoperative anemia due to acute blood loss 12/24/2012  .    09/23/2012  . Sinusitis 07/28/2012  . Neuropathy due to chemotherapeutic drug ([place]) 07/25/2011  . 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 Breast cancer, left breast ([place]) 11/12/2010  . Chronic anticoagulation 11/12/2010   . Anxiety, generalized 03/26/2008    Mikle Bosworth PTA 02/07/2016, 4:44 PM  [facility] [address], [place], [place], [address] Phone: [phone]   Fax:  [phone]  Name: [name] MRN: [id number] Date of Birth: [birth year]

## 2016-02-11 ENCOUNTER — Encounter: Payer: Self-pay | Admitting: General Practice

## 2016-02-11 ENCOUNTER — Other Ambulatory Visit: Payer: Self-pay | Admitting: Family Medicine

## 2016-02-11 ENCOUNTER — Ambulatory Visit: Payer: 59

## 2016-02-11 DIAGNOSIS — R2689 Other abnormalities of gait and mobility: Secondary | ICD-10-CM | POA: Diagnosis not present

## 2016-02-11 DIAGNOSIS — M6281 Muscle weakness (generalized): Secondary | ICD-10-CM | POA: Diagnosis not present

## 2016-02-11 DIAGNOSIS — M25562 Pain in left knee: Secondary | ICD-10-CM

## 2016-02-11 DIAGNOSIS — M25662 Stiffness of left knee, not elsewhere classified: Secondary | ICD-10-CM | POA: Diagnosis not present

## 2016-02-11 DIAGNOSIS — R600 Localized edema: Secondary | ICD-10-CM | POA: Diagnosis not present

## 2016-02-11 MED FILL — GABAPENTIN 300 MG CAPSULE: 300 | 30 days supply | Qty: 90 | Fill #0

## 2016-02-11 MED FILL — CELECOXIB 200 MG CAPSULE: 200 | 30 days supply | Qty: 60 | Fill #5

## 2016-02-11 MED FILL — DARIFENACIN ER 15 MG TABLET: 15 | 30 days supply | Qty: 30 | Fill #2

## 2016-02-11 NOTE — Therapy (Signed)
[facility] [address], [place], [place], [address] Phone: [phone]   Fax:  [phone]  Physical Therapy Treatment  Patient Details  Name: [name] MRN: [id number] Date of Birth: 1955/10/04 Referring Provider: Susa Day, MD  Encounter Date: 02/11/2016      PT End of Session - 02/11/16 0840    Visit Number 3   Date for PT Re-Evaluation 04/01/16   PT Start Time 0800   PT Stop Time 8527   PT Time Calculation (min) 57 min   Activity Tolerance Patient tolerated treatment well   Behavior During Therapy [facility] for tasks assessed/performed      Past Medical History:  Diagnosis Date  . 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 Arthritis KNEES  . Chronic anticoagulation HX  PORT-A-CATH CLOT   2010 - HAS BEEN ON LOVENOX SINCE THE CLOT  . Elevated blood pressure reading without diagnosis of hypertension    PT MONITORS HER B/P AT HOME  . 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 Skin cancer of anterior chest BREAST CANCER PRIMARY W/ METS TO CHEST WALL SKIN CANCER--  CHEMOTHERAPY EVERY 3 WEEKS AT [facility]    Past Surgical History:  Procedure Laterality Date  . CHEST WALL TUMOR EXCISION    . KNEE ARTHROSCOPY  02/13/2012   Procedure: ARTHROSCOPY KNEE;  Surgeon: Johnn Hai, MD;  Location: [facility];  Service: Orthopedics;  Laterality: Left;  WITH DEBRIDEMENt   . KNEE ARTHROSCOPY WITH LATERAL MENISECTOMY  02/13/2012   Procedure: KNEE ARTHROSCOPY WITH LATERAL MENISECTOMY;  Surgeon: Johnn Hai, MD;  Location: [place];  Service: Orthopedics;;  partial  . LEFT MODIFIED RADICAL MASTECTOMY/ RIGHT TOTAL MASTECTOMY  11-13-2007   LEFT BREAST CANCER W/ AXILLARY LYMPH NODE METASTASIS AND POST NEOADJUVANT CHEMO  . PLACEMENT PORT-A-CATH  06-22-2007  .  SKIN GRAFT     chest-post tumor removal  . TOTAL KNEE ARTHROPLASTY Left 09/23/2012   Procedure: LEFT TOTAL KNEE ARTHROPLASTY;  Surgeon: Johnn Hai, MD;  [facility];  Service: Orthopedics;  Laterality: Left;  . TRANSTHORACIC ECHOCARDIOGRAM  11-18-2011  DR BENSIMHON (ECHO EVERY 3 MONTHS)   HX CHEMO INDUCED CARDIOTOXICITY/  LVSF NORMAL/ EF 55-50%/ MILD MITRIAL VALVE REGURG./ MILDLY INCREASED SYSTOLIC PRESSURE OF PULMONARY ARTERIES    There were no vitals filed for this visit.      Subjective Assessment - 02/11/16 0801    Subjective I think my knee looks and feels so much better.  Rt knee is feeling worse than my Lt.     Currently in Pain? No/denies   Multiple Pain Sites No            [facility] PT Assessment - 02/11/16 0001      Circumferential Edema   Circumferential - Left  mid patella 17 inches, mid calf 15.5 inches     AROM   Left Knee Extension 0   Left Knee Flexion 100                     [facility] Adult PT Treatment/Exercise - 02/11/16 0001      Knee/Hip Exercises: Aerobic   Nustep L2  x 8 minutes  seat 9, arms 10     Knee/Hip Exercises: Standing   Heel Raises Both;2 sets;10 reps  Knee Flexion Strengthening;2 sets;10 reps   Hip Flexion Stengthening;Both;2 sets;10 reps   Hip Abduction Stengthening;Both;2 sets;10 reps     Knee/Hip Exercises: Supine   Short Arc Quad Sets Strengthening;Left;2 sets;10 reps   Heel Slides AROM;Strengthening;Both;2 sets;10 reps   Straight Leg Raises AROM;Strengthening;2 sets;10 reps     Modalities   Modalities Vasopneumatic     Vasopneumatic   Number Minutes Vasopneumatic  15 minutes   Vasopnuematic Location  Knee   Vasopneumatic Pressure Medium   Vasopneumatic Temperature  3 snowflakes     Manual Therapy   Manual Therapy Edema management   Manual therapy comments Supine with legs propped   Edema Management Edema massage to Lt LE to increase circulationand decrease edema                  PT Short  Term Goals - 02/11/16 0802      PT SHORT TERM GOAL #1   Title be independent in initial HEP   Time 4   Period Weeks   Status On-going     PT SHORT TERM GOAL #2   Title reduced Lt LE edema: mid patella < or = to 17.5 inches, mid calf < or = to 15.25  inches to allow for return to work   Time 4   Period Weeks   Status On-going     PT SHORT TERM GOAL #3   Title demonstrate Lt knee AROM flexion to > or = to 100 degrees   Status Achieved           PT Long Term Goals - 02/05/16 1203      PT LONG TERM GOAL #1   Title be independent in advanced HEP   Time 8   Period Weeks   Status New     PT LONG TERM GOAL #2   Title reduce FOTO to < or = to 32% limitation   Time 8   Period Weeks   Status New     PT LONG TERM GOAL #3   Title reduced Lt LE edema: mid patella < or = to 17.25 inches, mid calf < or = to 15 inches to allow for return to work   Time 8   Period Weeks   Status New     PT LONG TERM GOAL #4   Title return to standing and walking without limitaiton to allow for return to work   Time 8   Period Weeks   Status New     PT LONG TERM GOAL #5   Title report 50% reduction in LT knee pain with standing and walking   Time 8   Period Weeks   Status New     Additional Long Term Goals   Additional Long Term Goals Yes     PT LONG TERM GOAL #6   Title improve Lt knee AROM flexion to > or = to 110 degrees to improve squatting and sitting   Time 8   Period Weeks   Status New               Plan - 02/11/16 0802    Clinical Impression Statement Pt with continued Lt knee edema and ecchymosis s/p fall.  Pt with antalgic gait pattern mostly due to Rt knee pain and alignment.  Lt knee AROM is improved to 0-100 degrees and mid patella edema is improved by 1 inch.   Pt will continue to benefit from skilled PT for Lt LE ROM progression and edema  management.     Rehab Potential Good   PT Frequency 2x / week   PT Duration 8 weeks   PT Treatment/Interventions ADLs/Self  Care Home Management;Cryotherapy;Electrical Stimulation;Functional mobility training;Stair training;Gait training;Moist Heat;Therapeutic activities;Therapeutic exercise;Balance training;Neuromuscular re-education;Patient/family education;Passive range of motion;Manual techniques;Vasopneumatic Device   PT Next Visit Plan Edema management, Lt knee AROM, strength, gait.  Primary focus to reduce edema and improve mobility   Consulted and Agree with Plan of Care Patient      Patient will benefit from skilled therapeutic intervention in order to improve the following deficits and impairments:  Difficulty walking, Decreased range of motion, Pain, Increased edema, Decreased strength, Decreased mobility, Impaired flexibility, Decreased activity tolerance, Decreased endurance  Visit Diagnosis: Pain in left knee  Stiffness of left knee, not elsewhere classified  Muscle weakness (generalized)  Other abnormalities of gait and mobility  Localized edema     Problem List Patient Active Problem List   Diagnosis Date Noted  . Vitamin D deficiency 01/25/2016  . 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 Fever and chills 02/01/2015  . Osteoarthritis 02/01/2015  . Anemia 02/01/2015  . Frequent UTI 01/12/2014  . Sinus tachycardia ([place]) 11/03/2013  . DOE (dyspnea on exertion) 11/03/2013  . Postoperative anemia due to acute blood loss 12/24/2012  .    09/23/2012  . Sinusitis 07/28/2012  . Neuropathy due to chemotherapeutic drug ([place]) 07/25/2011  . 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 Breast cancer, left breast ([place]) 11/12/2010  . Chronic anticoagulation 11/12/2010  . Anxiety, generalized 03/26/2008     Sigurd Sos, PT 02/11/16 8:42 AM  [facility] [address], [place], [place], [address] Phone: [phone]   Fax:  [phone]  Name: [name] MRN: [id number] Date of Birth: 1956-01-03

## 2016-02-13 ENCOUNTER — Encounter: Payer: Self-pay | Admitting: Physical Therapy

## 2016-02-13 ENCOUNTER — Ambulatory Visit: Payer: 59 | Admitting: Physical Therapy

## 2016-02-13 DIAGNOSIS — M25662 Stiffness of left knee, not elsewhere classified: Secondary | ICD-10-CM

## 2016-02-13 DIAGNOSIS — R2689 Other abnormalities of gait and mobility: Secondary | ICD-10-CM

## 2016-02-13 DIAGNOSIS — R600 Localized edema: Secondary | ICD-10-CM

## 2016-02-13 DIAGNOSIS — M6281 Muscle weakness (generalized): Secondary | ICD-10-CM

## 2016-02-13 DIAGNOSIS — M25562 Pain in left knee: Secondary | ICD-10-CM | POA: Diagnosis not present

## 2016-02-13 NOTE — Therapy (Signed)
[facility] [address], [address] [place], [place], [address] Phone: [phone]   Fax:  [phone]  Physical Therapy Treatment  Patient Details  Name: [name] MRN: [id number] Date of Birth: [birth year] Referring Provider: Jene Every, MD  Encounter Date: 02/13/2016      PT End of Session - 02/13/16 0842    Visit Number 4   Date for PT Re-Evaluation 04/01/16   PT Start Time 0832   PT Stop Time 0930   PT Time Calculation (min) 58 min   Activity Tolerance Patient tolerated treatment well   Behavior During Therapy [facility] for tasks assessed/performed      Past Medical History:  Diagnosis Date  . 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 Arthritis KNEES  . Chronic anticoagulation HX  PORT-A-CATH CLOT   2010 - HAS BEEN ON LOVENOX SINCE THE CLOT  . Elevated blood pressure reading without diagnosis of hypertension    PT MONITORS HER B/P AT HOME  . 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 Skin cancer of anterior chest BREAST CANCER PRIMARY W/ METS TO CHEST WALL SKIN CANCER--  CHEMOTHERAPY EVERY 3 WEEKS AT [facility]    Past Surgical History:  Procedure Laterality Date  . CHEST WALL TUMOR EXCISION    . KNEE ARTHROSCOPY  02/13/2012   Procedure: ARTHROSCOPY KNEE;  Surgeon: Javier Docker, MD;  Location: [facility];  Service: Orthopedics;  Laterality: Left;  WITH DEBRIDEMENt   . KNEE ARTHROSCOPY WITH LATERAL MENISECTOMY  02/13/2012   Procedure: KNEE ARTHROSCOPY WITH LATERAL MENISECTOMY;  Surgeon: Javier Docker, MD;  Location: [facility];  Service: Orthopedics;;  partial  . LEFT MODIFIED RADICAL MASTECTOMY/ RIGHT TOTAL MASTECTOMY  11-13-2007   LEFT BREAST CANCER W/ AXILLARY LYMPH NODE METASTASIS AND POST NEOADJUVANT CHEMO  . PLACEMENT PORT-A-CATH  06-22-2007  .  SKIN GRAFT     chest-post tumor removal  . TOTAL KNEE ARTHROPLASTY Left 09/23/2012   Procedure: LEFT TOTAL KNEE ARTHROPLASTY;  Surgeon: Javier Docker, MD;  [facility];  Service: Orthopedics;  Laterality: Left;  . TRANSTHORACIC ECHOCARDIOGRAM  11-18-2011  DR BENSIMHON (ECHO EVERY 3 MONTHS)   HX CHEMO INDUCED CARDIOTOXICITY/  LVSF NORMAL/ EF 55-50%/ MILD MITRIAL VALVE REGURG./ MILDLY INCREASED SYSTOLIC PRESSURE OF PULMONARY ARTERIES    There were no vitals filed for this visit.      Subjective Assessment - 02/13/16 0841    Subjective My RT knee is more "crunchy" this AM. No "real pain."   Currently in Pain? No/denies   Multiple Pain Sites No            [facility] PT Assessment - 02/13/16 0001      AROM   Left Knee Flexion 105                     [facility] Adult PT Treatment/Exercise - 02/13/16 0001      Knee/Hip Exercises: Standing   Heel Raises Both;2 sets;10 reps  VC for quad set when heels lift   Hip Abduction Stengthening;Both;2 sets;10 reps  VC for straight knee on LT   Rebounder weight shifting 1 min 3 ways  VC to be aware of her weight bearing in LTLE     Knee/Hip Exercises: Supine   Short Arc Quad Sets AROM;Strengthening;Left;2 sets;10 reps  Heel Slides AROM;Left;1 set;20 reps   Straight Leg Raises AROM;Strengthening;Left;20 reps;Limitations  VC for quad set first prior to lift     Vasopneumatic   Number Minutes Vasopneumatic  15 minutes   Vasopnuematic Location  Knee   Vasopneumatic Pressure High   Vasopneumatic Temperature  3 snowflakes     Manual Therapy   Manual Therapy Edema management   Manual therapy comments Supine with legs propped   Edema Management Edema massage to Lt LE to increase circulationand decrease edema                  PT Short Term Goals - 02/11/16 0802      PT SHORT TERM GOAL #1   Title be independent in initial HEP   Time 4   Period Weeks   Status On-going     PT SHORT TERM GOAL #2   Title reduced Lt  LE edema: mid patella < or = to 17.5 inches, mid calf < or = to 15.25  inches to allow for return to work   Time 4   Period Weeks   Status On-going     PT SHORT TERM GOAL #3   Title demonstrate Lt knee AROM flexion to > or = to 100 degrees   Status Achieved           PT Long Term Goals - 02/05/16 1203      PT LONG TERM GOAL #1   Title be independent in advanced HEP   Time 8   Period Weeks   Status New     PT LONG TERM GOAL #2   Title reduce FOTO to < or = to 32% limitation   Time 8   Period Weeks   Status New     PT LONG TERM GOAL #3   Title reduced Lt LE edema: mid patella < or = to 17.25 inches, mid calf < or = to 15 inches to allow for return to work   Time 8   Period Weeks   Status New     PT LONG TERM GOAL #4   Title return to standing and walking without limitaiton to allow for return to work   Time 8   Period Weeks   Status New     PT LONG TERM GOAL #5   Title report 50% reduction in LT knee pain with standing and walking   Time 8   Period Weeks   Status New     Additional Long Term Goals   Additional Long Term Goals Yes     PT LONG TERM GOAL #6   Title improve Lt knee AROM flexion to > or = to 110 degrees to improve squatting and sitting   Time 8   Period Weeks   Status New               Plan - 02/13/16 0912    Clinical Impression Statement Pt continues to have decreased swelling and bruising in her LTLE. She achieved 105 degrees of knee flexion today. Pt also tolerated more standing activites today without increasing pain.    Rehab Potential Good   PT Frequency 2x / week   PT Duration 8 weeks   PT Treatment/Interventions ADLs/Self Care Home Management;Cryotherapy;Electrical Stimulation;Functional mobility training;Stair training;Gait training;Moist Heat;Therapeutic activities;Therapeutic exercise;Balance training;Neuromuscular re-education;Patient/family education;Passive range of motion;Manual techniques;Vasopneumatic Device   PT Next  Visit Plan Edema management, Lt knee AROM, strength, gait.  Primary focus to reduce edema and improve mobility  Consulted and Agree with Plan of Care Patient      Patient will benefit from skilled therapeutic intervention in order to improve the following deficits and impairments:  Difficulty walking, Decreased range of motion, Pain, Increased edema, Decreased strength, Decreased mobility, Impaired flexibility, Decreased activity tolerance, Decreased endurance  Visit Diagnosis: Pain in left knee  Stiffness of left knee, not elsewhere classified  Muscle weakness (generalized)  Other abnormalities of gait and mobility  Localized edema     Problem List Patient Active Problem List   Diagnosis Date Noted  . Vitamin D deficiency 01/25/2016  . 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 Fever and chills 02/01/2015  . Osteoarthritis 02/01/2015  . Anemia 02/01/2015  . Frequent UTI 01/12/2014  . Sinus tachycardia ([place]) 11/03/2013  . DOE (dyspnea on exertion) 11/03/2013  . Postoperative anemia due to acute blood loss 12/24/2012  .    09/23/2012  . Sinusitis 07/28/2012  . Neuropathy due to chemotherapeutic drug ([place]) 07/25/2011  . 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 Breast cancer, left breast ([place]) 11/12/2010  . Chronic anticoagulation 11/12/2010  . Anxiety, generalized 03/26/2008    Lafayette Dunlevy , PTA 02/13/2016, 9:15 AM  [facility] [address] [place], [place], [address] Phone: [phone]   Fax:  [phone]  Name: [name] MRN: [id number] Date of Birth: Mar 25, 1956

## 2016-02-15 MED FILL — ZOLPIDEM TARTRATE 5 MG TAB: 5 | 30 days supply | Qty: 30 | Fill #2

## 2016-02-18 ENCOUNTER — Encounter: Payer: Self-pay | Admitting: Physical Therapy

## 2016-02-18 ENCOUNTER — Ambulatory Visit: Payer: 59 | Admitting: Physical Therapy

## 2016-02-18 DIAGNOSIS — R600 Localized edema: Secondary | ICD-10-CM | POA: Diagnosis not present

## 2016-02-18 DIAGNOSIS — M6281 Muscle weakness (generalized): Secondary | ICD-10-CM | POA: Diagnosis not present

## 2016-02-18 DIAGNOSIS — M25662 Stiffness of left knee, not elsewhere classified: Secondary | ICD-10-CM | POA: Diagnosis not present

## 2016-02-18 DIAGNOSIS — M25562 Pain in left knee: Secondary | ICD-10-CM

## 2016-02-18 DIAGNOSIS — R2689 Other abnormalities of gait and mobility: Secondary | ICD-10-CM | POA: Diagnosis not present

## 2016-02-18 NOTE — Therapy (Addendum)
[facility] [address], [place], [place], [address] Phone: [phone]   Fax:  [phone]  Physical Therapy Treatment  Patient Details  Name: [name] MRN: [id number] Date of Birth: 13-Aug-1955 Referring Provider: Susa Day, MD  Encounter Date: 02/18/2016      PT End of Session - 02/18/16 0759    Visit Number 5   Date for PT Re-Evaluation 04/01/16   PT Start Time 0758   PT Stop Time 0850   PT Time Calculation (min) 52 min   Activity Tolerance Patient tolerated treatment well   Behavior During Therapy [facility] for tasks assessed/performed      Past Medical History:  Diagnosis Date  . 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 Arthritis KNEES  . Chronic anticoagulation HX  PORT-A-CATH CLOT   2010 - HAS BEEN ON LOVENOX SINCE THE CLOT  . Elevated blood pressure reading without diagnosis of hypertension    PT MONITORS HER B/P AT HOME  . 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 Skin cancer of anterior chest BREAST CANCER PRIMARY W/ METS TO CHEST WALL SKIN CANCER--  CHEMOTHERAPY EVERY 3 WEEKS AT [facility]    Past Surgical History:  Procedure Laterality Date  . CHEST WALL TUMOR EXCISION    . KNEE ARTHROSCOPY  02/13/2012   Procedure: ARTHROSCOPY KNEE;  Surgeon: Johnn Hai, MD;  Location: [facility];  Service: Orthopedics;  Laterality: Left;  WITH DEBRIDEMENt   . KNEE ARTHROSCOPY WITH LATERAL MENISECTOMY  02/13/2012   Procedure: KNEE ARTHROSCOPY WITH LATERAL MENISECTOMY;  Surgeon: Johnn Hai, MD;  Location: [place];  Service: Orthopedics;;  partial  . LEFT MODIFIED RADICAL MASTECTOMY/ RIGHT TOTAL MASTECTOMY  11-13-2007   LEFT BREAST CANCER W/ AXILLARY LYMPH NODE METASTASIS AND POST NEOADJUVANT CHEMO  . PLACEMENT PORT-A-CATH  06-22-2007  .  SKIN GRAFT     chest-post tumor removal  . TOTAL KNEE ARTHROPLASTY Left 09/23/2012   Procedure: LEFT TOTAL KNEE ARTHROPLASTY;  Surgeon: Johnn Hai, MD;  Location: [facility];  Service: Orthopedics;  Laterality: Left;  . TRANSTHORACIC ECHOCARDIOGRAM  11-18-2011  DR BENSIMHON (ECHO EVERY 3 MONTHS)   HX CHEMO INDUCED CARDIOTOXICITY/  LVSF NORMAL/ EF 55-50%/ MILD MITRIAL VALVE REGURG./ MILDLY INCREASED SYSTOLIC PRESSURE OF PULMONARY ARTERIES    There were no vitals filed for this visit.      Subjective Assessment - 02/18/16 0800    Subjective Rt knee pain this AM.    Currently in Pain? Yes   Pain Score 4    Pain Location Knee   Pain Orientation Right   Pain Descriptors / Indicators Dull;Aching   Aggravating Factors  Standing too long   Pain Relieving Factors Ice   Multiple Pain Sites No            [facility] PT Assessment - 02/18/16 0001      AROM   Left Knee Flexion 108                     [facility] Adult PT Treatment/Exercise - 02/18/16 0001      Knee/Hip Exercises: Aerobic   Nustep L2 x 10 min     Knee/Hip Exercises: Standing   Heel Raises Both;2 sets;10 reps  VC for quad set when heels lift   Hip Abduction  Stengthening;Both;2 sets;10 reps  VC for straight knee on LT, added 2# ankle weights   Forward Step Up Left;2 sets;10 reps;Hand Hold: 2;Step Height: 6"   Rocker Board 3 minutes   Rebounder weight shifting 1 min 3 ways  VC to be aware of her weight bearing in LTLE     Knee/Hip Exercises: Supine   Short Arc Quad Sets AROM;Strengthening;Left;1 set;10 reps   Short Arc Quad Sets Limitations 2# very challenging     Vasopneumatic   Number Minutes Vasopneumatic  15 minutes   Vasopnuematic Location  Knee   Vasopneumatic Pressure High   Vasopneumatic Temperature  3 snowflakes     Manual Therapy   Manual Therapy Edema management   Manual therapy comments Supine with legs propped   Edema Management Edema massage to Lt LE to increase circulationand decrease  edema, patella mobs                  PT Short Term Goals - 02/18/16 0803      PT SHORT TERM GOAL #1   Title be independent in initial HEP   Time 4   Period Weeks   Status Achieved           PT Long Term Goals - 02/18/16 2841      PT LONG TERM GOAL #5   Title report 50% reduction in LT knee pain with standing and walking   Time 8   Period Weeks   Status Achieved  Lt knee 90% pain free:               Plan - 02/18/16 0800    Clinical Impression Statement Swelling continues to reduce, AROM continues to increase, pain down 90% with standing and walking, ability to go up stairs improving: RT knee limits alternating pattern.    Rehab Potential Good   PT Frequency 2x / week   PT Duration 8 weeks   PT Treatment/Interventions ADLs/Self Care Home Management;Cryotherapy;Electrical Stimulation;Functional mobility training;Stair training;Gait training;Moist Heat;Therapeutic activities;Therapeutic exercise;Balance training;Neuromuscular re-education;Patient/family education;Passive range of motion;Manual techniques;Vasopneumatic Device   PT Next Visit Plan To MD tomorrow.    Consulted and Agree with Plan of Care Patient      Patient will benefit from skilled therapeutic intervention in order to improve the following deficits and impairments:  Difficulty walking, Decreased range of motion, Pain, Increased edema, Decreased strength, Decreased mobility, Impaired flexibility, Decreased activity tolerance, Decreased endurance  Visit Diagnosis: Pain in left knee  Stiffness of left knee, not elsewhere classified  Muscle weakness (generalized)  Other abnormalities of gait and mobility  Localized edema     Problem List Patient Active Problem List   Diagnosis Date Noted  . Vitamin D deficiency 01/25/2016  . Metastatic cancer to chest wall ([place]) 02/02/2015  . Fever and chills 02/01/2015  . Osteoarthritis 02/01/2015  . Anemia 02/01/2015  . Frequent UTI 01/12/2014   . Sinus tachycardia ([place]) 11/03/2013  . DOE (dyspnea on exertion) 11/03/2013  . Postoperative anemia due to acute blood loss 12/24/2012  .    09/23/2012  . Sinusitis 07/28/2012  . Neuropathy due to chemotherapeutic drug ([place]) 07/25/2011  . 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 Breast cancer, left breast ([place]) 11/12/2010  . Chronic anticoagulation 11/12/2010  . Anxiety, generalized 03/26/2008    [name] 02/18/2016, 8:38 AM PHYSICAL THERAPY DISCHARGE SUMMARY  Visits from Start of Care: 5  Current functional level related to goals / functional outcomes: Pt has been released to return to work and requested D/C at this  time.  See above for most current status.     Remaining deficits: See above.     Education / Equipment: HEP Plan: Patient agrees to discharge.  Patient goals were partially met. Patient is being discharged due to the patient's request.  ?????        Sigurd Sos, PT 02/21/16 8:26 AM    [facility] [address], [place], [place], [address] Phone: [phone]   Fax:  [phone]  Name: [name] MRN: [id number] Date of Birth: 18-Mar-1956

## 2016-02-19 DIAGNOSIS — M1711 Unilateral primary osteoarthritis, right knee: Secondary | ICD-10-CM | POA: Diagnosis not present

## 2016-02-19 DIAGNOSIS — Z471 Aftercare following joint replacement surgery: Secondary | ICD-10-CM | POA: Diagnosis not present

## 2016-02-19 DIAGNOSIS — Z96652 Presence of left artificial knee joint: Secondary | ICD-10-CM | POA: Diagnosis not present

## 2016-02-19 DIAGNOSIS — S8002XD Contusion of left knee, subsequent encounter: Secondary | ICD-10-CM | POA: Diagnosis not present

## 2016-02-21 ENCOUNTER — Encounter: Payer: Self-pay | Admitting: Physical Therapy

## 2016-02-25 ENCOUNTER — Encounter: Payer: Self-pay | Admitting: Physical Therapy

## 2016-02-25 DIAGNOSIS — C792 Secondary malignant neoplasm of skin: Secondary | ICD-10-CM | POA: Diagnosis not present

## 2016-02-25 DIAGNOSIS — Z5112 Encounter for antineoplastic immunotherapy: Secondary | ICD-10-CM | POA: Diagnosis not present

## 2016-02-25 DIAGNOSIS — C50412 Malignant neoplasm of upper-outer quadrant of left female breast: Secondary | ICD-10-CM | POA: Diagnosis not present

## 2016-02-25 MED FILL — ELIQUIS 5 MG TABLET: 5 | 30 days supply | Qty: 60 | Fill #9

## 2016-02-25 MED FILL — traMADol HCL 50 MG TABS: 50 | 30 days supply | Qty: 90 | Fill #2

## 2016-02-26 ENCOUNTER — Encounter: Payer: Self-pay | Admitting: Physical Therapy

## 2016-02-26 LAB — HM PAP SMEAR

## 2016-02-29 ENCOUNTER — Encounter: Payer: Self-pay | Admitting: Physical Therapy

## 2016-03-03 ENCOUNTER — Other Ambulatory Visit: Payer: Self-pay

## 2016-03-03 ENCOUNTER — Ambulatory Visit (HOSPITAL_COMMUNITY)
Admission: RE | Admit: 2016-03-03 | Discharge: 2016-03-03 | Disposition: A | Discharge status: Home / Self Care | Payer: 59 | Source: Ambulatory Visit | Attending: Internal Medicine | Admitting: Internal Medicine

## 2016-03-03 ENCOUNTER — Encounter (HOSPITAL_COMMUNITY): Payer: Self-pay | Admitting: Internal Medicine

## 2016-03-03 ENCOUNTER — Other Ambulatory Visit (HOSPITAL_COMMUNITY): Payer: Self-pay | Admitting: *Deleted

## 2016-03-03 ENCOUNTER — Ambulatory Visit (HOSPITAL_COMMUNITY)
Admission: RE | Admit: 2016-03-03 | Discharge: 2016-03-03 | Disposition: A | Discharge status: Home / Self Care | Payer: 59 | Source: Ambulatory Visit | Attending: Family Medicine | Admitting: Family Medicine

## 2016-03-03 VITALS — BP 126/74 | HR 83 | Wt 163.5 lb

## 2016-03-03 DIAGNOSIS — C792 Secondary malignant neoplasm of skin: Secondary | ICD-10-CM | POA: Diagnosis not present

## 2016-03-03 DIAGNOSIS — Z7901 Long term (current) use of anticoagulants: Secondary | ICD-10-CM | POA: Diagnosis not present

## 2016-03-03 DIAGNOSIS — Z923 Personal history of irradiation: Secondary | ICD-10-CM | POA: Diagnosis not present

## 2016-03-03 DIAGNOSIS — Z8249 Family history of ischemic heart disease and other diseases of the circulatory system: Secondary | ICD-10-CM | POA: Diagnosis not present

## 2016-03-03 DIAGNOSIS — C50912 Malignant neoplasm of unspecified site of left female breast: Secondary | ICD-10-CM

## 2016-03-03 DIAGNOSIS — Z171 Estrogen receptor negative status [ER-]: Secondary | ICD-10-CM | POA: Diagnosis not present

## 2016-03-03 DIAGNOSIS — Z880 Allergy status to penicillin: Secondary | ICD-10-CM | POA: Insufficient documentation

## 2016-03-03 DIAGNOSIS — Z9013 Acquired absence of bilateral breasts and nipples: Secondary | ICD-10-CM | POA: Insufficient documentation

## 2016-03-03 DIAGNOSIS — D649 Anemia, unspecified: Secondary | ICD-10-CM | POA: Diagnosis not present

## 2016-03-03 DIAGNOSIS — Z803 Family history of malignant neoplasm of breast: Secondary | ICD-10-CM | POA: Diagnosis not present

## 2016-03-03 DIAGNOSIS — Z79899 Other long term (current) drug therapy: Secondary | ICD-10-CM | POA: Insufficient documentation

## 2016-03-03 DIAGNOSIS — Z9221 Personal history of antineoplastic chemotherapy: Secondary | ICD-10-CM | POA: Diagnosis not present

## 2016-03-03 DIAGNOSIS — M1711 Unilateral primary osteoarthritis, right knee: Secondary | ICD-10-CM | POA: Insufficient documentation

## 2016-03-03 DIAGNOSIS — Z01810 Encounter for preprocedural cardiovascular examination: Secondary | ICD-10-CM

## 2016-03-03 DIAGNOSIS — C50919 Malignant neoplasm of unspecified site of unspecified female breast: Secondary | ICD-10-CM | POA: Diagnosis present

## 2016-03-03 NOTE — Addendum Note (Signed)
Encounter addended by: Effie Berkshire, RN on: 03/03/2016  2:45 PM<BR>    Actions taken: Sign clinical note

## 2016-03-03 NOTE — Progress Notes (Signed)
Echocardiogram 2D Echocardiogram has been performed.  [name] 03/03/2016, 1:45 PM

## 2016-03-03 NOTE — Progress Notes (Signed)
Patient ID: [name], female   DOB: [birth year], 60 y.o.   MRN: [id number]     Cardio-Oncology Clinic Note  PCP: Dr. 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   She is s/p bilateral mastectomies, XRT, chemo. She is currently on Kadcyla (trastuzamab-emantsine) every 3 weeks. Underwent skin flap for skim metastasis on her abdomen in 2/15   Had skin resection with chest skin flap on 02/21/15 at [facility]. Requiring wound vac. Path ok fortunately with no recurrent malignancy.  Had clots around port-a-cath so on Elqiuis.   She returns for follow up. Wants cardiac clearance for L knee replacement. She continues on Kadcyla every 60 months at [facility].  Doing well. No recurrence except for R shoulder skin mets. Walking in neighborhood. Trace LE edema at night. Denies  CP/SOB/Orthopnea. 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    ECHO 09/27/2013 EF 60-65% Lateral S'10.7 Grade 2 DD ECHO 02/27/2014 EF 60-65% Lateral S' 10.4  ECHO 07/14/2014  EF 60-65% Lateral S' 10.4 GLS -23.1% mild MR ECHO 04/18/2015  EF 60-65% Lateral S' 13.6 GLS -19.2% mild TR Trivial MR. RV ok ECHO 03/03/2016 EF 60-65% lateral S 11.8 GLS -19.2 poor window   Past Medical History:  Diagnosis Date  . 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 Arthritis KNEES  . Chronic anticoagulation HX  PORT-A-CATH CLOT   2010 - HAS BEEN ON LOVENOX SINCE THE CLOT  . Elevated blood pressure reading without diagnosis of hypertension    PT MONITORS HER B/P AT HOME  . 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 Skin cancer of anterior chest BREAST CANCER PRIMARY W/ METS TO CHEST WALL SKIN CANCER--  CHEMOTHERAPY EVERY 3 WEEKS AT [facility]    Current Outpatient Prescriptions  Medication Sig Dispense Refill  . Ado-Trastuzumab Emtansine (KADCYLA  IV) Inject into the vein. 2.61m/kg/ Chemotherapy drug given every 3 weeks at [facility]    . apixaban (ELIQUIS) 5 MG TABS tablet Take 5 mg by mouth 2 (two) times daily.     . celecoxib (CELEBREX) 200 MG capsule TAKE 1 CAPSULE BY MOUTH TWICE DAILY 60 capsule 5  . Cholecalciferol (VITAMIN D3) 2000 UNITS TABS Take 1 tablet by mouth 4 (four) times a week.     . darifenacin (ENABLEX) 15 MG 24 hr tablet Take 15 mg by mouth daily.    .Marland Kitchendocusate sodium 100 MG CAPS Take 100 mg by mouth 2 (two) times daily. 10 capsule   . gabapentin (NEURONTIN) 300 MG capsule TAKE 1 CAPSULE BY MOUTH 3 TIMES DAILY 90 capsule 6  . lidocaine (LIDODERM) 5 % APPLY 1 PATCH TO AFFECTED AREA NO MORE THAN 12 HOURS IN A 24 HOUR PERIOD  1  . ondansetron (ZOFRAN) 8 MG tablet Take 1 tablet (8 mg total) by mouth every 8 (eight) hours as needed for nausea.    . polyethylene glycol (MIRALAX / GLYCOLAX) packet Take by mouth.    . traMADol (ULTRAM) 50 MG tablet TAKE 1 TABLET BY MOUTH EVERY 6 HOURS AS NEEDED FOR PAIN 60 tablet 5  . zolpidem (AMBIEN) 5 MG tablet TAKE 1 TABLET BY MOUTH AT BEDTIME AS NEEDED FOR SLEEP 30 tablet 2   No current facility-administered medications for this encounter.  Allergies  Allergen Reactions  . Tape Hives    Can tolerate paper tape  . Other Rash    STERI STRIPS - Blisters  . Penicillins Rash    Denies airway involvement    Social History   Social History  . Marital status: Married    Spouse name: N/A  . Number of children: 3  . Years of education: N/A   Occupational History  . RN (cath lab at [employer])    Social History Main Topics  . Smoking status: Never Smoker  . Smokeless tobacco: Never Used  . Alcohol use No  . Drug use: No  . Sexual activity: Not on file   Other Topics Concern  . 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           Family History  Problem Relation Age of Onset  . Heart disease Mother     due to mitral valve regurgiation,   . Cancer Mother     breast  . Parkinsonism Father   . Alcohol abuse Paternal Grandfather     PHYSICAL EXAM: Vitals:   03/03/16 1402  BP: 126/74  Pulse: 83  SpO2: 99%  Weight: 163 lb 8 oz (74.2 kg)   General:  Well appearing. No respiratory difficulty HEENT: normal Neck: supple. no JVD. Carotids 2+ bilat; no bruits. No lymphadenopathy or thryomegaly appreciated. Cor: PMI nondisplaced. Regular rate & rhythm. No rubs, gallops or murmurs. + right shoulder skin mets Lungs: clear Abdomen: soft, nontender, nondistended. No hepatosplenomegaly. No bruits or masses. Good bowel sounds. Extremities: no cyanosis, clubbing, rash, tr edema Neuro: alert & oriented x 3, cranial nerves grossly intact. moves all 4 extremities w/o difficulty. Affect pleasant.   ECG: NSR 85 No ST-T wave abnormalities.   ASSESSMENT & PLAN: 1. 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 Anemia 3. R Knee Osteoarthritis-   Continue Kadcyla. Repeat echo 6 months.  Amy Clegg NP-C  2:11 PM     Patient seen and examined with Darrick Grinder, NP. We discussed all aspects of the encounter. I agree with the assessment and plan as stated above.   I reviewed echos personally. EF and Doppler parameters stable. No HF on exam. Continue Kadcyla. Repeat echo in 6 months. She is at low risk for cardiac complication with R TKA. Can proceed without further cardiac testing.  Rainer Mounce,MD 2:39 PM

## 2016-03-03 NOTE — Patient Instructions (Signed)
No changes in medications today.  No lab work today.  EKG today.  You have been cleared from cardiac perspective for total knee. Office note faxed to [facility].  Follow up 6 months with Dr. Haroldine Laws.  Do the following things EVERYDAY: 1) Weigh yourself in the morning before breakfast. Write it down and keep it in a log. 2) Take your medicines as prescribed 3) Eat low salt foods-Limit salt (sodium) to 2000 mg per day.  4) Stay as active as you can everyday 5) Limit all fluids for the day to less than 2 liters

## 2016-03-05 ENCOUNTER — Encounter: Payer: Self-pay | Admitting: General Practice

## 2016-03-11 ENCOUNTER — Ambulatory Visit: Payer: Self-pay | Admitting: Orthopedic Surgery

## 2016-03-11 DIAGNOSIS — M1711 Unilateral primary osteoarthritis, right knee: Secondary | ICD-10-CM | POA: Diagnosis not present

## 2016-03-11 MED FILL — DARIFENACIN ER 15 MG TABLET: 15 | 30 days supply | Qty: 30 | Fill #3

## 2016-03-11 NOTE — H&P (Signed)
[name] is an 60 y.o. female.    H&P date: 03/11/16  Chief Complaint: R knee pain HPI: She is still having ongoing right knee pain, which was refractory to the recent series of Euflexxa as well as recent cortisone, wakes her up in the middle of the night, about 3 a.m. every night with pain. She is limping on a regular basis, has trouble getting in and out of the car, has trouble getting around at work. She has been taking tramadol as needed for more severe pain. She did have a recent body scan with Dr. Philipp Ovens and was told that that was good and she was verbally cleared by Dr. Philipp Ovens to proceed with a knee replacement, although we do not have written clearance yet at this point. She is currently on Eliquis for DVT prophylaxis. She is still on chemotherapy Kadcyla, which she gets an infusion of every three weeks. Last time that we did her left total knee replacement she had a reaction to the adhesive of the Aquacel, extensive swelling with that postoperatively. She would like to discuss having her right total knee replacement done. We have discussed this previously if refractory to conservative treatment. She does feel that the right knee is to the point where it is interfering with regular activities due to pain.  Past Medical History:  Diagnosis Date  . 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 Arthritis KNEES  . Chronic anticoagulation HX  PORT-A-CATH CLOT   2010 - HAS BEEN ON LOVENOX SINCE THE CLOT  . Elevated blood pressure reading without diagnosis of hypertension    PT MONITORS HER B/P AT HOME  . 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 Skin cancer of anterior chest BREAST CANCER PRIMARY W/ METS TO CHEST WALL SKIN CANCER--  CHEMOTHERAPY EVERY 3 WEEKS AT [facility]  bladder spasms  Osteoarthritis  Breast Cancer  dx 2009, s/p chemo, mastectomy, XRT. mets  to chest wall and skin. Follow[facility] by Dr. 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 CHEST WALL TUMOR EXCISION    . KNEE ARTHROSCOPY  02/13/2012   Procedure: ARTHROSCOPY KNEE;  Surgeon: Johnn Hai, MD;  Location: [facility];  Service: Orthopedics;  Laterality: Left;  WITH DEBRIDEMENt   . KNEE ARTHROSCOPY WITH LATERAL MENISECTOMY  02/13/2012   Procedure: KNEE ARTHROSCOPY WITH LATERAL MENISECTOMY;  Surgeon: Johnn Hai, MD;  Location: [facility];  Service: Orthopedics;;  partial  . LEFT MODIFIED RADICAL MASTECTOMY/ RIGHT TOTAL MASTECTOMY  11-13-2007   LEFT BREAST CANCER W/ AXILLARY LYMPH NODE METASTASIS AND POST NEOADJUVANT CHEMO  . PLACEMENT PORT-A-CATH  06-22-2007  . SKIN GRAFT     chest-post tumor removal  . TOTAL KNEE ARTHROPLASTY Left 09/23/2012   Procedure: LEFT TOTAL KNEE ARTHROPLASTY;  Surgeon: Johnn Hai, MD;  [facility];  Service: Orthopedics;  Laterality: Left;  . TRANSTHORACIC ECHOCARDIOGRAM  11-18-2011  DR BENSIMHON (ECHO EVERY 3 MONTHS)   HX CHEMO INDUCED CARDIOTOXICITY/  LVSF NORMAL/ EF 55-50%/ MILD MITRIAL VALVE REGURG./ MILDLY INCREASED SYSTOLIC PRESSURE OF PULMONARY ARTERIES    Family History  Problem Relation Age of Onset  . Heart disease Mother  due to mitral valve regurgiation,   . Cancer Mother     breast  . Parkinsonism Father   . Alcohol abuse Paternal Grandfather    Social History Tobacco use  Never smoker. 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 Tape Hives    Can tolerate paper tape  . Other Rash    STERI STRIPS - Blisters  . Penicillins Rash    Denies airway involvement  Dilaudid- vomiting  Medication History  TraMADol HCl (50MG Tablet, 1 (one) Oral every six hours as needed for pain, Taken starting 12/04/2015) Active. Lidoderm (5% Patch, 1 (one) Patch External apply to affected area no more than 12 hours in a 24 hour period, Taken starting 01/17/2016) Active. (1 patch, no more than 12 hours in a 24 hour period) Eliquis (5MG Tablet, Oral) Active. Enablex (15MG Tablet ER 24HR, Oral) Active. Neurontin (300MG Capsule, Oral) Active. Ambien (5MG Tablet, Oral) Active. Kadcyla (Intravenous) Specific strength unknown - Active. Vitamin D (Oral) Specific strength unknown - Active. CeleBREX (Oral) Specific strength unknown - Active. Medications Reconciled  Review of Systems  Constitutional: Negative.   HENT: Negative.   Eyes: Negative.   Respiratory: Negative.   Cardiovascular: Positive for leg swelling.  Gastrointestinal: Negative.   Genitourinary: Negative.   Musculoskeletal: Positive for falls and joint pain.  Skin: Negative.   Neurological: Positive for sensory change.    Last menstrual period 06/22/2007. Physical Exam  Constitutional: She is oriented to person, place, and time. She appears well-developed. She appears distressed.  HENT:  Head: Normocephalic.  Eyes: Pupils are equal, round, and reactive to light.  Neck: Normal range of motion.  Cardiovascular: Normal rate.   Respiratory: Effort normal.  GI: Soft.  Musculoskeletal:  On exam, she is well nourished, well developed, has an antalgic  gait. She is awake, alert, and oriented x3, in no acute distress. Bilateral lower leg edema noted.  Right knee exam, she is tender to palpation through both the medial and lateral joint line. Mild effusion. She is able to fully extend. Flexion is limited due to pain approximately 100 degrees. No calf pain or sign of DVT. No pain or laxity with varus or valgus stress. She does have a valgus thrust with ambulation.  Neurological: She is alert and oriented to person, place, and time.    Prior x-rays reviewed. There has been some settling and aseptic loosening on the left knee tibial component specifically. Her right knee is bone on bone laterally with a valgus deformity, also with some patellofemoral wear.  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  For the right knee, she has ongoing pain from end stage DJD. Instability is impacting her ADL, waking up from pain at night. At this point, as she  has failed conservative treatment for the right knee, we have mutually agreed to proceed with a right total knee replacement. We have obtained clearance from her cardiologist, and her oncologist, Dr. Philipp Ovens, and I have asked Dr. Philipp Ovens to make specific recommendations regarding the timing of her chemo, Kadcyla, as she currently gets that infused every three weeks and we will need to time that appropriately around the time of surgery (can perform surgery Thursday after infusion Monday). She is on Eliquis. We will need to hold that preoperatively (48 hrs). We will plan on vancomycin. Staples as opposed to sutures given her reaction to Steri-Strips and will use an old fashioned dressing with 4x4s, and ABDs as opposed to an Aquacel with her adhesive allergy that she experienced last time. Plan for preop labs, CBC,  sed rate, LFTs, CRP in addition to the standard. Postoperatively, we will plan for Percocet for pain control, Robaxin,MiraLax, Colace, and she will resume her Eliquis. Gay Filler and I mutually agreed to waive her H and P. If she changes her mind and would like to come in for a preop H and P, she will let me know. She did well with general anesthesia, last time was with scopolamine patch. We will plan to repeat that. I have given her a note to return to work tomorrow. She does plan to take short term disability for 12 weeks postoperatively and then if able to return earlier, she can, to avoid going back too early causing swelling. She will follow up 10 to 14 days postop. The patient was seen in conjunction with Dr. Tonita Cong today.  Plan right total knee replacement  Cecilie Kicks., PA-C for Dr. Tonita Cong 03/11/2016, 10:31 AM

## 2016-03-13 ENCOUNTER — Telehealth: Payer: Self-pay | Admitting: General Practice

## 2016-03-13 NOTE — Telephone Encounter (Signed)
Called pt and left a detailed message with husband to inform that I cannot complete form without a waist circumference on patient. Husband will inform and have her return call with this information.

## 2016-03-14 ENCOUNTER — Telehealth (HOSPITAL_COMMUNITY): Payer: Self-pay | Admitting: *Deleted

## 2016-03-14 NOTE — Telephone Encounter (Signed)
Received form from [place] requesting clearance for pt to have RTK, per Dr Haroldine Laws pt cleared, form along with OV note faxed to Dr Paticia Stack at [phone]

## 2016-03-17 ENCOUNTER — Other Ambulatory Visit: Payer: Self-pay | Admitting: Family Medicine

## 2016-03-17 ENCOUNTER — Encounter (HOSPITAL_COMMUNITY): Payer: Self-pay

## 2016-03-17 ENCOUNTER — Encounter (HOSPITAL_COMMUNITY)
Admission: RE | Admit: 2016-03-17 | Discharge: 2016-03-17 | Disposition: A | Discharge status: Home / Self Care | Payer: 59 | Source: Ambulatory Visit | Attending: Specialist | Admitting: Specialist

## 2016-03-17 DIAGNOSIS — Z9221 Personal history of antineoplastic chemotherapy: Secondary | ICD-10-CM | POA: Diagnosis not present

## 2016-03-17 DIAGNOSIS — Z96652 Presence of left artificial knee joint: Secondary | ICD-10-CM | POA: Diagnosis not present

## 2016-03-17 DIAGNOSIS — C50412 Malignant neoplasm of upper-outer quadrant of left female breast: Secondary | ICD-10-CM | POA: Diagnosis not present

## 2016-03-17 DIAGNOSIS — Z5112 Encounter for antineoplastic immunotherapy: Secondary | ICD-10-CM | POA: Diagnosis not present

## 2016-03-17 DIAGNOSIS — C792 Secondary malignant neoplasm of skin: Secondary | ICD-10-CM | POA: Diagnosis not present

## 2016-03-17 DIAGNOSIS — Z86718 Personal history of other venous thrombosis and embolism: Secondary | ICD-10-CM | POA: Diagnosis not present

## 2016-03-17 DIAGNOSIS — Z79899 Other long term (current) drug therapy: Secondary | ICD-10-CM | POA: Diagnosis not present

## 2016-03-17 DIAGNOSIS — C50919 Malignant neoplasm of unspecified site of unspecified female breast: Secondary | ICD-10-CM | POA: Diagnosis not present

## 2016-03-17 DIAGNOSIS — M1711 Unilateral primary osteoarthritis, right knee: Secondary | ICD-10-CM | POA: Diagnosis not present

## 2016-03-17 DIAGNOSIS — D62 Acute posthemorrhagic anemia: Secondary | ICD-10-CM | POA: Diagnosis not present

## 2016-03-17 DIAGNOSIS — Z7901 Long term (current) use of anticoagulants: Secondary | ICD-10-CM | POA: Diagnosis not present

## 2016-03-17 DIAGNOSIS — Z85828 Personal history of other malignant neoplasm of skin: Secondary | ICD-10-CM | POA: Diagnosis not present

## 2016-03-17 HISTORY — DX: Other specified postprocedural states: Z98.890

## 2016-03-17 HISTORY — DX: Cardiac murmur, unspecified: R01.1

## 2016-03-17 HISTORY — DX: Nausea with vomiting, unspecified: R11.2

## 2016-03-17 LAB — URINE MICROSCOPIC-ADD ON

## 2016-03-17 LAB — URINALYSIS, ROUTINE W REFLEX MICROSCOPIC
BILIRUBIN URINE: NEGATIVE
GLUCOSE, UA: NEGATIVE mg/dL
HGB URINE DIPSTICK: NEGATIVE
KETONES UR: NEGATIVE mg/dL
Nitrite: POSITIVE — AB
PROTEIN: NEGATIVE mg/dL
Specific Gravity, Urine: 1.011 (ref 1.005–1.030)
pH: 6 (ref 5.0–8.0)

## 2016-03-17 LAB — PROTIME-INR
INR: 1.15
Prothrombin Time: 14.8 seconds (ref 11.4–15.2)

## 2016-03-17 LAB — SURGICAL PCR SCREEN
MRSA, PCR: NEGATIVE
Staphylococcus aureus: NEGATIVE

## 2016-03-17 LAB — APTT: aPTT: 40 seconds — ABNORMAL HIGH (ref 24–36)

## 2016-03-17 LAB — SEDIMENTATION RATE: SED RATE: 42 mm/h — AB (ref 0–22)

## 2016-03-17 LAB — C-REACTIVE PROTEIN: CRP: 1.1 mg/dL — ABNORMAL HIGH (ref <1.0)

## 2016-03-17 MED FILL — GABAPENTIN 300 MG CAPSULE: 300 | 30 days supply | Qty: 90 | Fill #1

## 2016-03-17 MED FILL — ZOLPIDEM TARTRATE 5 MG TAB: 5 | 30 days supply | Qty: 30 | Fill #0

## 2016-03-17 NOTE — Telephone Encounter (Signed)
Noted, will complete papers and fax.

## 2016-03-17 NOTE — Telephone Encounter (Signed)
Last OV 01/25/16 Zolpidem last filled 12/13/15 #30 with 2

## 2016-03-17 NOTE — Pre-Procedure Instructions (Addendum)
EKG/ Echo  10 '17 Epic.  03-17-16 labs done CBC/d, CMP results with chart from [place].

## 2016-03-17 NOTE — Telephone Encounter (Signed)
Medication filled to pharmacy as requested.   

## 2016-03-17 NOTE — Patient Instructions (Addendum)
[name]  03/17/2016   Your procedure is scheduled on: 03-20-16   Report to [facility] Main  Entrance take [facility]  elevators to 3rd floor to  [place] at [place] AM.  Call this number if you have problems the morning of surgery [phone]   Remember: ONLY 1 PERSON MAY GO WITH YOU TO SHORT STAY TO GET  READY MORNING OF [place].  Do not eat food or drink liquids :After Midnight.     Take these medicines the morning of surgery with A SIP OF WATER: Gabapentin. Tramadol-if need. DO NOT TAKE ANY DIABETIC MEDICATIONS DAY OF YOUR SURGERY                               You may not have any metal on your body including hair pins and              piercings  Do not wear jewelry, make-up, lotions, powders or perfumes, deodorant             Do not wear nail polish.  Do not shave  48 hours prior to surgery.              Men may shave face and neck.   Do not bring valuables to the hospital. [place].  Contacts, dentures or bridgework may not be worn into surgery.  Leave suitcase in the car. After surgery it may be brought to your room.     Patients discharged the day of surgery will not be allowed to drive home.  Name and phone number of your driver: Olena Heckle [phone] cell  Special Instructions: N/A              Please read over the following fact sheets you were given: _____________________________________________________________________             [facility] - Preparing for Surgery Before surgery, you can play an important role.  Because skin is not sterile, your skin needs to be as free of germs as possible.  You can reduce the number of germs on your skin by washing with CHG (chlorahexidine gluconate) soap before surgery.  CHG is an antiseptic cleaner which kills germs and bonds with the skin to continue killing germs even after washing. Please DO NOT use if you have an allergy to CHG or  antibacterial soaps.  If your skin becomes reddened/irritated stop using the CHG and inform your nurse when you arrive at Short Stay. Do not shave (including legs and underarms) for at least 48 hours prior to the first CHG shower.  You may shave your face/neck. Please follow these instructions carefully:  1.  Shower with CHG Soap the night before surgery and the  morning of Surgery.  2.  If you choose to wash your hair, wash your hair first as usual with your  normal  shampoo.  3.  After you shampoo, rinse your hair and body thoroughly to remove the  shampoo.                           4.  Use CHG as you would any other liquid soap.  You can apply chg directly  to  the skin and wash                       Gently with a scrungie or clean washcloth.  5.  Apply the CHG Soap to your body ONLY FROM THE NECK DOWN.   Do not use on face/ open                           Wound or open sores. Avoid contact with eyes, ears mouth and genitals (private parts).                       Wash face,  Genitals (private parts) with your normal soap.             6.  Wash thoroughly, paying special attention to the area where your surgery  will be performed.  7.  Thoroughly rinse your body with warm water from the neck down.  8.  DO NOT shower/wash with your normal soap after using and rinsing off  the CHG Soap.                9.  Pat yourself dry with a clean towel.            10.  Wear clean pajamas.            11.  Place clean sheets on your bed the night of your first shower and do not  sleep with pets. Day of Surgery : Do not apply any lotions/deodorants the morning of surgery.  Please wear clean clothes to the hospital/surgery center.  FAILURE TO FOLLOW THESE INSTRUCTIONS MAY RESULT IN THE CANCELLATION OF YOUR SURGERY PATIENT SIGNATURE_________________________________  NURSE SIGNATURE__________________________________  ________________________________________________________________________   [name]  An incentive spirometer is a tool that can help keep your lungs clear and active. This tool measures how well you are filling your lungs with each breath. Taking long deep breaths may help reverse or decrease the chance of developing breathing (pulmonary) problems (especially infection) following:  A long period of time when you are unable to move or be active. BEFORE THE PROCEDURE   If the spirometer includes an indicator to show your best effort, your nurse or respiratory therapist will set it to a desired goal.  If possible, sit up straight or lean slightly forward. Try not to slouch.  Hold the incentive spirometer in an upright position. INSTRUCTIONS FOR USE  1. Sit on the edge of your bed if possible, or sit up as far as you can in bed or on a chair. 2. Hold the incentive spirometer in an upright position. 3. Breathe out normally. 4. Place the mouthpiece in your mouth and seal your lips tightly around it. 5. Breathe in slowly and as deeply as possible, raising the piston or the ball toward the top of the column. 6. Hold your breath for 3-5 seconds or for as long as possible. Allow the piston or ball to fall to the bottom of the column. 7. Remove the mouthpiece from your mouth and breathe out normally. 8. Rest for a few seconds and repeat Steps 1 through 7 at least 10 times every 1-2 hours when you are awake. Take your time and take a few normal breaths between deep breaths. 9. The spirometer may include an indicator to show your best effort. Use the indicator as a goal to work toward during each repetition. 10. After each set  of 10 deep breaths, practice coughing to be sure your lungs are clear. If you have an incision (the cut made at the time of surgery), support your incision when coughing by placing a pillow or rolled up towels firmly against it. Once you are able to get out of bed, walk around indoors and cough well. You may stop using the incentive spirometer when  instructed by your caregiver.  RISKS AND COMPLICATIONS  Take your time so you do not get dizzy or light-headed.  If you are in pain, you may need to take or ask for pain medication before doing incentive spirometry. It is harder to take a deep breath if you are having pain. AFTER USE  Rest and breathe slowly and easily.  It can be helpful to keep track of a log of your progress. Your caregiver can provide you with a simple table to help with this. If you are using the spirometer at home, follow these instructions: [place] IF:   You are having difficultly using the spirometer.  You have trouble using the spirometer as often as instructed.  Your pain medication is not giving enough relief while using the spirometer.  You develop fever of 100.5 F (38.1 C) or higher. SEEK IMMEDIATE MEDICAL CARE IF:   You cough up bloody sputum that had not been present before.  You develop fever of 102 F (38.9 C) or greater.  You develop worsening pain at or near the incision site. MAKE SURE YOU:   Understand these instructions.  Will watch your condition.  Will get help right away if you are not doing well or get worse. Document Released: 09/22/2006 Document Revised: 08/04/2011 Document Reviewed: 11/23/2006 ExitCare Patient Information 2014 ExitCare, [place].   ________________________________________________________________________  WHAT IS A BLOOD TRANSFUSION? Blood Transfusion Information  A transfusion is the replacement of blood or some of its parts. Blood is made up of multiple cells which provide different functions.  Red blood cells carry oxygen and are used for blood loss replacement.  White blood cells fight against infection.  Platelets control bleeding.  Plasma helps clot blood.  Other blood products are available for specialized needs, such as hemophilia or other clotting disorders. BEFORE THE TRANSFUSION  Who gives blood for transfusions?   Healthy  volunteers who are fully evaluated to make sure their blood is safe. This is blood bank blood. Transfusion therapy is the safest it has ever been in the practice of medicine. Before blood is taken from a donor, a complete history is taken to make sure that person has no history of diseases nor engages in risky social behavior (examples are intravenous drug use or sexual activity with multiple partners). The donor's travel history is screened to minimize risk of transmitting infections, such as malaria. The donated blood is tested for signs of infectious diseases, such as HIV and hepatitis. The blood is then tested to be sure it is compatible with you in order to minimize the chance of a transfusion reaction. If you or a relative donates blood, this is often done in anticipation of surgery and is not appropriate for emergency situations. It takes many days to process the donated blood. RISKS AND COMPLICATIONS Although transfusion therapy is very safe and saves many lives, the main dangers of transfusion include:   Getting an infectious disease.  Developing a transfusion reaction. This is an allergic reaction to something in the blood you were given. Every precaution is taken to prevent this. The decision to have a  blood transfusion has been considered carefully by your caregiver before blood is given. Blood is not given unless the benefits outweigh the risks. AFTER THE TRANSFUSION  Right after receiving a blood transfusion, you will usually feel much better and more energetic. This is especially true if your red blood cells have gotten low (anemic). The transfusion raises the level of the red blood cells which carry oxygen, and this usually causes an energy increase.  The nurse administering the transfusion will monitor you carefully for complications. HOME CARE INSTRUCTIONS  No special instructions are needed after a transfusion. You may find your energy is better. Speak with your caregiver about any  limitations on activity for underlying diseases you may have. SEEK MEDICAL CARE IF:   Your condition is not improving after your transfusion.  You develop redness or irritation at the intravenous (IV) site. SEEK IMMEDIATE MEDICAL CARE IF:  Any of the following symptoms occur over the next 12 hours:  Shaking chills.  You have a temperature by mouth above 102 F (38.9 C), not controlled by medicine.  Chest, back, or muscle pain.  People around you feel you are not acting correctly or are confused.  Shortness of breath or difficulty breathing.  Dizziness and fainting.  You get a rash or develop hives.  You have a decrease in urine output.  Your urine turns a dark color or changes to pink, red, or brown. Any of the following symptoms occur over the next 10 days:  You have a temperature by mouth above 102 F (38.9 C), not controlled by medicine.  Shortness of breath.  Weakness after normal activity.  The white part of the eye turns yellow (jaundice).  You have a decrease in the amount of urine or are urinating less often.  Your urine turns a dark color or changes to pink, red, or brown. Document Released: 05/09/2000 Document Revised: 08/04/2011 Document Reviewed: 12/27/2007 [facility] Patient Information 2014 [place], [place].  _______________________________________________________________________

## 2016-03-17 NOTE — Telephone Encounter (Signed)
Pt called back and states that her waist circumference is 32 inch.

## 2016-03-18 NOTE — Progress Notes (Signed)
03-18-16 labs viewable in Epic-please note, urinalysis and others, CBC/d, CMP(BUN/creatiinine ratio =30, from outside source- other WNL.

## 2016-03-20 ENCOUNTER — Inpatient Hospital Stay (HOSPITAL_COMMUNITY)
Admission: RE | Admit: 2016-03-20 | Discharge: 2016-03-23 | DRG: 470 | Disposition: A | Discharge status: Home Health | Payer: 59 | Source: Ambulatory Visit | Attending: Specialist | Admitting: Specialist

## 2016-03-20 ENCOUNTER — Encounter (HOSPITAL_COMMUNITY): Payer: Self-pay

## 2016-03-20 ENCOUNTER — Inpatient Hospital Stay (HOSPITAL_COMMUNITY): Payer: 59 | Admitting: Anesthesiology

## 2016-03-20 ENCOUNTER — Inpatient Hospital Stay (HOSPITAL_COMMUNITY): Payer: 59

## 2016-03-20 ENCOUNTER — Encounter (HOSPITAL_COMMUNITY)
Admission: RE | Disposition: A | Discharge status: Home Health | Payer: Self-pay | Source: Ambulatory Visit | Attending: Specialist

## 2016-03-20 DIAGNOSIS — Z79899 Other long term (current) drug therapy: Secondary | ICD-10-CM

## 2016-03-20 DIAGNOSIS — Z85828 Personal history of other malignant neoplasm of skin: Secondary | ICD-10-CM | POA: Diagnosis not present

## 2016-03-20 DIAGNOSIS — D62 Acute posthemorrhagic anemia: Secondary | ICD-10-CM | POA: Diagnosis not present

## 2016-03-20 DIAGNOSIS — C50919 Malignant neoplasm of unspecified site of unspecified female breast: Secondary | ICD-10-CM | POA: Diagnosis present

## 2016-03-20 DIAGNOSIS — Z96659 Presence of unspecified artificial knee joint: Secondary | ICD-10-CM

## 2016-03-20 DIAGNOSIS — Z86718 Personal history of other venous thrombosis and embolism: Secondary | ICD-10-CM | POA: Diagnosis not present

## 2016-03-20 DIAGNOSIS — Z96652 Presence of left artificial knee joint: Secondary | ICD-10-CM | POA: Diagnosis present

## 2016-03-20 DIAGNOSIS — Z7901 Long term (current) use of anticoagulants: Secondary | ICD-10-CM | POA: Diagnosis not present

## 2016-03-20 DIAGNOSIS — Z471 Aftercare following joint replacement surgery: Secondary | ICD-10-CM | POA: Diagnosis not present

## 2016-03-20 DIAGNOSIS — Z96651 Presence of right artificial knee joint: Secondary | ICD-10-CM | POA: Diagnosis not present

## 2016-03-20 DIAGNOSIS — M1711 Unilateral primary osteoarthritis, right knee: Secondary | ICD-10-CM | POA: Diagnosis not present

## 2016-03-20 DIAGNOSIS — C7989 Secondary malignant neoplasm of other specified sites: Secondary | ICD-10-CM

## 2016-03-20 DIAGNOSIS — Z9221 Personal history of antineoplastic chemotherapy: Secondary | ICD-10-CM

## 2016-03-20 DIAGNOSIS — M25561 Pain in right knee: Secondary | ICD-10-CM | POA: Diagnosis present

## 2016-03-20 HISTORY — PX: TOTAL KNEE ARTHROPLASTY: SHX125

## 2016-03-20 SURGERY — ARTHROPLASTY, KNEE, TOTAL
Anesthesia: General | Site: Knee | Laterality: Right

## 2016-03-20 MED ORDER — DEXAMETHASONE SODIUM PHOSPHATE 10 MG/ML IJ SOLN
INTRAMUSCULAR | Status: DC | PRN
  Administered 2016-03-20: 10 mg via INTRAVENOUS

## 2016-03-20 MED ORDER — STERILE WATER FOR IRRIGATION IR SOLN
Status: DC | PRN
  Administered 2016-03-20: 3000 mL

## 2016-03-20 MED ORDER — LIDOCAINE-EPINEPHRINE (PF) 1 %-1:200000 IJ SOLN
INTRAMUSCULAR | Status: AC
  Filled 2016-03-20: qty 30

## 2016-03-20 MED ORDER — PROPOFOL 10 MG/ML IV BOLUS
INTRAVENOUS | Status: DC | PRN
  Administered 2016-03-20: 170 mg via INTRAVENOUS

## 2016-03-20 MED ORDER — FENTANYL CITRATE (PF) 100 MCG/2ML IJ SOLN
INTRAMUSCULAR | Status: AC
Start: 2016-03-20 — End: 2016-03-20
  Filled 2016-03-20: qty 2

## 2016-03-20 MED ORDER — ONDANSETRON HCL 4 MG PO TABS: 8.0000 mg | ORAL_TABLET | Freq: Three times a day (TID) | ORAL | Status: DC | PRN

## 2016-03-20 MED ORDER — DIPHENHYDRAMINE HCL 12.5 MG/5ML PO ELIX: 12.5000 mg | ORAL_SOLUTION | ORAL | Status: DC | PRN

## 2016-03-20 MED ORDER — RISAQUAD PO CAPS
1.0000 | ORAL_CAPSULE | Freq: Every day | ORAL | Status: DC
  Administered 2016-03-20 – 2016-03-22 (×3): 1 via ORAL
  Filled 2016-03-20 (×4): qty 1

## 2016-03-20 MED ORDER — MENTHOL 3 MG MT LOZG
1.0000 | LOZENGE | OROMUCOSAL | Status: DC | PRN
  Filled 2016-03-20: qty 9

## 2016-03-20 MED ORDER — POLYETHYLENE GLYCOL 3350 17 G PO PACK: 17.0000 g | PACK | Freq: Every day | ORAL | 0 refills | Status: DC

## 2016-03-20 MED ORDER — LACTATED RINGERS IV SOLN
INTRAVENOUS | Status: DC
  Administered 2016-03-20 (×2): via INTRAVENOUS

## 2016-03-20 MED ORDER — LIDOCAINE HCL (CARDIAC) 20 MG/ML IV SOLN
INTRAVENOUS | Status: DC | PRN
  Administered 2016-03-20: 100 mg via INTRAVENOUS

## 2016-03-20 MED ORDER — VANCOMYCIN HCL IN DEXTROSE 1-5 GM/200ML-% IV SOLN
1000.0000 mg | INTRAVENOUS | Status: AC
  Administered 2016-03-20: 1000 mg via INTRAVENOUS
  Filled 2016-03-20: qty 200

## 2016-03-20 MED ORDER — HYDROMORPHONE HCL 1 MG/ML IJ SOLN
INTRAMUSCULAR | Status: DC | PRN
  Administered 2016-03-20 (×2): 0.5 mg via INTRAVENOUS
  Administered 2016-03-20: 1 mg via INTRAVENOUS

## 2016-03-20 MED ORDER — LIP MEDEX EX OINT
TOPICAL_OINTMENT | CUTANEOUS | Status: AC
  Filled 2016-03-20: qty 7

## 2016-03-20 MED ORDER — METHOCARBAMOL 500 MG PO TABS
500.0000 mg | ORAL_TABLET | Freq: Four times a day (QID) | ORAL | Status: DC | PRN
Start: 2016-03-20 — End: 2016-03-23
  Administered 2016-03-20 – 2016-03-22 (×6): 500 mg via ORAL
  Filled 2016-03-20 (×6): qty 1

## 2016-03-20 MED ORDER — MIDAZOLAM HCL 5 MG/5ML IJ SOLN
INTRAMUSCULAR | Status: DC | PRN
  Administered 2016-03-20: 2 mg via INTRAVENOUS

## 2016-03-20 MED ORDER — FENTANYL CITRATE (PF) 100 MCG/2ML IJ SOLN
INTRAMUSCULAR | Status: AC
  Filled 2016-03-20: qty 2

## 2016-03-20 MED ORDER — PROPOFOL 10 MG/ML IV BOLUS
INTRAVENOUS | Status: AC
  Filled 2016-03-20: qty 60

## 2016-03-20 MED ORDER — MIDAZOLAM HCL 2 MG/2ML IJ SOLN
INTRAMUSCULAR | Status: AC
  Filled 2016-03-20: qty 2

## 2016-03-20 MED ORDER — DARIFENACIN HYDROBROMIDE ER 15 MG PO TB24
15.0000 mg | ORAL_TABLET | Freq: Every day | ORAL | Status: DC
  Administered 2016-03-21 – 2016-03-22 (×2): 15 mg via ORAL
  Filled 2016-03-20 (×4): qty 1

## 2016-03-20 MED ORDER — METHOCARBAMOL 500 MG PO TABS: 500.0000 mg | ORAL_TABLET | Freq: Four times a day (QID) | ORAL | 1 refills | Status: DC | PRN

## 2016-03-20 MED ORDER — ONDANSETRON HCL 4 MG/2ML IJ SOLN: 4.0000 mg | Freq: Four times a day (QID) | INTRAMUSCULAR | Status: DC | PRN

## 2016-03-20 MED ORDER — ACETAMINOPHEN 10 MG/ML IV SOLN
INTRAVENOUS | Status: DC | PRN
  Administered 2016-03-20: 1000 mg via INTRAVENOUS

## 2016-03-20 MED ORDER — DOCUSATE SODIUM 100 MG PO CAPS
100.0000 mg | ORAL_CAPSULE | Freq: Two times a day (BID) | ORAL | Status: DC
  Administered 2016-03-20 – 2016-03-22 (×5): 100 mg via ORAL
  Filled 2016-03-20 (×7): qty 1

## 2016-03-20 MED ORDER — ROCURONIUM BROMIDE 100 MG/10ML IV SOLN
INTRAVENOUS | Status: DC | PRN
  Administered 2016-03-20: 50 mg via INTRAVENOUS

## 2016-03-20 MED ORDER — ONDANSETRON HCL 4 MG/2ML IJ SOLN
INTRAMUSCULAR | Status: AC
  Filled 2016-03-20: qty 2

## 2016-03-20 MED ORDER — SODIUM CHLORIDE 0.9 % IR SOLN
Status: DC | PRN
  Administered 2016-03-20: 500 mL

## 2016-03-20 MED ORDER — SCOPOLAMINE 1 MG/3DAYS TD PT72SCOPOLAMINE 1 MG/3DAYS
MEDICATED_PATCH | TRANSDERMAL | Status: DC | PRN
Start: 2016-03-20 — End: 2016-03-20
  Administered 2016-03-20: 1 via TRANSDERMAL

## 2016-03-20 MED ORDER — POLYETHYLENE GLYCOL 3350 17 G PO PACK
17.0000 g | PACK | Freq: Every day | ORAL | Status: DC | PRN
  Administered 2016-03-23: 17 g via ORAL
  Filled 2016-03-20: qty 1

## 2016-03-20 MED ORDER — SUGAMMADEX SODIUM 200 MG/2ML IV SOLN
INTRAVENOUS | Status: DC | PRN
Start: 2016-03-20 — End: 2016-03-20
  Administered 2016-03-20: 150 mg via INTRAVENOUS

## 2016-03-20 MED ORDER — TRAMADOL HCL 50 MG PO TABS
50.0000 mg | ORAL_TABLET | Freq: Four times a day (QID) | ORAL | Status: DC | PRN
  Administered 2016-03-22: 50 mg via ORAL
  Filled 2016-03-20: qty 1

## 2016-03-20 MED ORDER — SUGAMMADEX SODIUM 200 MG/2ML IV SOLN
INTRAVENOUS | Status: AC
  Filled 2016-03-20: qty 2

## 2016-03-20 MED ORDER — OXYCODONE-ACETAMINOPHEN 5-325 MG PO TABS: 1.0000 | ORAL_TABLET | ORAL | 0 refills | Status: DC | PRN

## 2016-03-20 MED ORDER — ONDANSETRON HCL 4 MG/2ML IJ SOLN
INTRAMUSCULAR | Status: DC | PRN
  Administered 2016-03-20: 4 mg via INTRAVENOUS

## 2016-03-20 MED ORDER — KCL IN DEXTROSE-NACL 20-5-0.45 MEQ/L-%-% IV SOLN
INTRAVENOUS | Status: AC
  Administered 2016-03-20 – 2016-03-21 (×2): via INTRAVENOUS
  Filled 2016-03-20 (×2): qty 1000

## 2016-03-20 MED ORDER — LIDOCAINE-EPINEPHRINE 1 %-1:100000 IJ SOLN
INTRAMUSCULAR | Status: DC | PRN
  Administered 2016-03-20: 45 mL

## 2016-03-20 MED ORDER — OXYCODONE HCL 5 MG PO TABS
5.0000 mg | ORAL_TABLET | ORAL | Status: DC | PRN
  Administered 2016-03-20 (×2): 5 mg via ORAL
  Administered 2016-03-20 – 2016-03-23 (×17): 10 mg via ORAL
  Filled 2016-03-20 (×10): qty 2
  Filled 2016-03-20: qty 1
  Filled 2016-03-20 (×9): qty 2

## 2016-03-20 MED ORDER — DOCUSATE SODIUM 100 MG PO CAPS: 100.0000 mg | ORAL_CAPSULE | Freq: Two times a day (BID) | ORAL | 1 refills | Status: DC | PRN

## 2016-03-20 MED ORDER — FENTANYL CITRATE (PF) 100 MCG/2ML IJ SOLN
INTRAMUSCULAR | Status: AC
  Administered 2016-03-20: 25 ug via INTRAVENOUS
  Filled 2016-03-20: qty 2

## 2016-03-20 MED ORDER — BUPIVACAINE HCL (PF) 0.25 % IJ SOLN
INTRAMUSCULAR | Status: AC
  Filled 2016-03-20: qty 30

## 2016-03-20 MED ORDER — MAGNESIUM CITRATE PO SOLN: 1.0000 | Freq: Once | ORAL | Status: DC | PRN

## 2016-03-20 MED ORDER — LIDOCAINE 2% (20 MG/ML) 5 ML SYRINGE
INTRAMUSCULAR | Status: AC
  Filled 2016-03-20: qty 5

## 2016-03-20 MED ORDER — ACETAMINOPHEN 650 MG RE SUPP: 650.0000 mg | Freq: Four times a day (QID) | RECTAL | Status: DC | PRN

## 2016-03-20 MED ORDER — PHENOL 1.4 % MT LIQD: 1.0000 | OROMUCOSAL | Status: DC | PRN

## 2016-03-20 MED ORDER — METOCLOPRAMIDE HCL 5 MG PO TABS: 5.0000 mg | ORAL_TABLET | Freq: Three times a day (TID) | ORAL | Status: DC | PRN

## 2016-03-20 MED ORDER — APIXABAN 5 MG PO TABS
5.0000 mg | ORAL_TABLET | Freq: Two times a day (BID) | ORAL | Status: DC
  Filled 2016-03-20: qty 1

## 2016-03-20 MED ORDER — BISACODYL 5 MG PO TBEC: 5.0000 mg | DELAYED_RELEASE_TABLET | Freq: Every day | ORAL | Status: DC | PRN

## 2016-03-20 MED ORDER — TRANEXAMIC ACID 1000 MG/10ML IV SOLN
2000.0000 mg | INTRAVENOUS | Status: DC
  Filled 2016-03-20: qty 20

## 2016-03-20 MED ORDER — FENTANYL CITRATE (PF) 100 MCG/2ML IJ SOLN
INTRAMUSCULAR | Status: DC | PRN
  Administered 2016-03-20 (×2): 50 ug via INTRAVENOUS
  Administered 2016-03-20: 100 ug via INTRAVENOUS

## 2016-03-20 MED ORDER — HYDROMORPHONE HCL 1 MG/ML IJ SOLN
1.0000 mg | INTRAMUSCULAR | Status: DC | PRN
  Administered 2016-03-20 – 2016-03-21 (×5): 1 mg via INTRAVENOUS
  Filled 2016-03-20 (×5): qty 1

## 2016-03-20 MED ORDER — METOCLOPRAMIDE HCL 5 MG/ML IJ SOLN: 5.0000 mg | Freq: Three times a day (TID) | INTRAMUSCULAR | Status: DC | PRN

## 2016-03-20 MED ORDER — FENTANYL CITRATE (PF) 100 MCG/2ML IJ SOLN
25.0000 ug | INTRAMUSCULAR | Status: DC | PRN
  Administered 2016-03-20: 50 ug via INTRAVENOUS
  Administered 2016-03-20 (×2): 25 ug via INTRAVENOUS

## 2016-03-20 MED ORDER — ONDANSETRON HCL 4 MG PO TABS
4.0000 mg | ORAL_TABLET | Freq: Four times a day (QID) | ORAL | Status: DC | PRN
  Administered 2016-03-22: 4 mg via ORAL
  Filled 2016-03-20: qty 1

## 2016-03-20 MED ORDER — ROCURONIUM BROMIDE 50 MG/5ML IV SOSY
PREFILLED_SYRINGE | INTRAVENOUS | Status: AC
  Filled 2016-03-20: qty 5

## 2016-03-20 MED ORDER — DEXTROSE 5 % IV SOLN
500.0000 mg | Freq: Four times a day (QID) | INTRAVENOUS | Status: DC | PRN
  Administered 2016-03-20: 500 mg via INTRAVENOUS
  Filled 2016-03-20: qty 5
  Filled 2016-03-20: qty 550

## 2016-03-20 MED ORDER — APIXABAN 2.5 MG PO TABS
2.5000 mg | ORAL_TABLET | Freq: Two times a day (BID) | ORAL | Status: AC
  Administered 2016-03-21 (×2): 2.5 mg via ORAL
  Filled 2016-03-20 (×2): qty 1

## 2016-03-20 MED ORDER — ALUM & MAG HYDROXIDE-SIMETH 200-200-20 MG/5ML PO SUSP: 30.0000 mL | ORAL | Status: DC | PRN

## 2016-03-20 MED ORDER — ZOLPIDEM TARTRATE 5 MG PO TABS
5.0000 mg | ORAL_TABLET | Freq: Every day | ORAL | Status: DC
  Administered 2016-03-21: 5 mg via ORAL
  Filled 2016-03-20 (×2): qty 1

## 2016-03-20 MED ORDER — GABAPENTIN 300 MG PO CAPS
300.0000 mg | ORAL_CAPSULE | Freq: Three times a day (TID) | ORAL | Status: DC
  Administered 2016-03-20 – 2016-03-22 (×8): 300 mg via ORAL
  Filled 2016-03-20 (×9): qty 1

## 2016-03-20 MED ORDER — PROMETHAZINE HCL 25 MG/ML IJ SOLN: 6.2500 mg | INTRAMUSCULAR | Status: DC | PRN

## 2016-03-20 MED ORDER — PHENYLEPHRINE 40 MCG/ML (10ML) SYRINGE FOR IV PUSH (FOR BLOOD PRESSURE SUPPORT)
PREFILLED_SYRINGE | INTRAVENOUS | Status: AC
  Filled 2016-03-20: qty 10

## 2016-03-20 MED ORDER — PHENYLEPHRINE 40 MCG/ML (10ML) SYRINGE FOR IV PUSH (FOR BLOOD PRESSURE SUPPORT)
PREFILLED_SYRINGE | INTRAVENOUS | Status: DC | PRN
  Administered 2016-03-20: 40 ug via INTRAVENOUS
  Administered 2016-03-20: 80 ug via INTRAVENOUS
  Administered 2016-03-20: 40 ug via INTRAVENOUS
  Administered 2016-03-20: 80 ug via INTRAVENOUS

## 2016-03-20 MED ORDER — ACETAMINOPHEN 10 MG/ML IV SOLN
INTRAVENOUS | Status: AC
  Filled 2016-03-20: qty 100

## 2016-03-20 MED ORDER — DEXAMETHASONE SODIUM PHOSPHATE 10 MG/ML IJ SOLN
INTRAMUSCULAR | Status: AC
  Filled 2016-03-20: qty 1

## 2016-03-20 MED ORDER — HYDROMORPHONE HCL 2 MG/ML IJ SOLN
INTRAMUSCULAR | Status: AC
  Filled 2016-03-20: qty 1

## 2016-03-20 MED ORDER — VANCOMYCIN HCL IN DEXTROSE 1-5 GM/200ML-% IV SOLN
1000.0000 mg | Freq: Two times a day (BID) | INTRAVENOUS | Status: AC
  Administered 2016-03-20: 1000 mg via INTRAVENOUS
  Filled 2016-03-20: qty 200

## 2016-03-20 MED ORDER — ACETAMINOPHEN 325 MG PO TABS
650.0000 mg | ORAL_TABLET | Freq: Four times a day (QID) | ORAL | Status: DC | PRN
  Administered 2016-03-21 – 2016-03-22 (×3): 650 mg via ORAL
  Filled 2016-03-20 (×4): qty 2

## 2016-03-20 MED ORDER — SODIUM CHLORIDE 0.9 % IR SOLN
Status: AC
  Filled 2016-03-20: qty 500000

## 2016-03-20 MED ORDER — SUCCINYLCHOLINE CHLORIDE 20 MG/ML IJ SOLN
INTRAMUSCULAR | Status: AC
Start: 2016-03-20 — End: 2016-03-20
  Filled 2016-03-20: qty 1

## 2016-03-20 MED ORDER — SCOPOLAMINE 1 MG/3DAYS TD PT72
MEDICATED_PATCH | TRANSDERMAL | Status: AC
  Filled 2016-03-20: qty 1

## 2016-03-20 SURGICAL SUPPLY — 65 items
AGENT HMST SPONGE THK3/8 (HEMOSTASIS) ×1
BAG SPEC THK2 15X12 ZIP CLS (MISCELLANEOUS)
BAG ZIPLOCK 12X15 (MISCELLANEOUS) IMPLANT
BANDAGE ACE 4X5 VEL STRL LF (GAUZE/BANDAGES/DRESSINGS) ×2 IMPLANT
BANDAGE ACE 6X5 VEL STRL LF (GAUZE/BANDAGES/DRESSINGS) ×2 IMPLANT
BLADE SAG 18X100X1.27 (BLADE) ×2 IMPLANT
BLADE SAW SGTL 13.0X1.19X90.0M (BLADE) ×2 IMPLANT
CAPT KNEE TOTAL 3 ATTUNE ×2 IMPLANT
CEMENT HV SMART SET (Cement) ×4 IMPLANT
CLOTH 2% CHLOROHEXIDINE 3PK (PERSONAL CARE ITEMS) ×2 IMPLANT
CUFF TOURN SGL QUICK 34 (TOURNIQUET CUFF) ×2
CUFF TRNQT CYL 34X4X40X1 (TOURNIQUET CUFF) ×1 IMPLANT
DECANTER SPIKE VIAL GLASS SM (MISCELLANEOUS) ×2 IMPLANT
DRAPE INCISE IOBAN 66X45 STRL (DRAPES) IMPLANT
DRAPE ORTHO SPLIT 77X108 STRL (DRAPES) ×4
DRAPE SHEET LG 3/4 BI-LAMINATE (DRAPES) ×2 IMPLANT
DRAPE SURG ORHT 6 SPLT 77X108 (DRAPES) ×2 IMPLANT
DRAPE U-SHAPE 47X51 STRL (DRAPES) ×2 IMPLANT
DRSG ADAPTIC 3X8 NADH LF (GAUZE/BANDAGES/DRESSINGS) ×1 IMPLANT
DRSG AQUACEL AG ADV 3.5X10 (GAUZE/BANDAGES/DRESSINGS) IMPLANT
DRSG PAD ABDOMINAL 8X10 ST (GAUZE/BANDAGES/DRESSINGS) ×1 IMPLANT
DRSG TEGADERM 4X4.75 (GAUZE/BANDAGES/DRESSINGS) IMPLANT
DURAPREP 26ML APPLICATOR (WOUND CARE) ×2 IMPLANT
ELECT REM PT RETURN 9FT ADLT (ELECTROSURGICAL) ×2
ELECTRODE REM PT RTRN 9FT ADLT (ELECTROSURGICAL) ×1 IMPLANT
EVACUATOR 1/8 PVC DRAIN (DRAIN) IMPLANT
GAUZE SPONGE 2X2 8PLY STRL LF (GAUZE/BANDAGES/DRESSINGS) IMPLANT
GAUZE SPONGE 4X4 12PLY STRL (GAUZE/BANDAGES/DRESSINGS) ×1 IMPLANT
GLOVE BIOGEL PI IND STRL 7.0 (GLOVE) ×1 IMPLANT
GLOVE BIOGEL PI IND STRL 8 (GLOVE) ×1 IMPLANT
GLOVE BIOGEL PI INDICATOR 7.0 (GLOVE) ×8
GLOVE BIOGEL PI INDICATOR 8 (GLOVE) ×1
GLOVE SURG SS PI 7.0 STRL IVOR (GLOVE) ×2 IMPLANT
GLOVE SURG SS PI 7.5 STRL IVOR (GLOVE) ×2 IMPLANT
GLOVE SURG SS PI 8.0 STRL IVOR (GLOVE) ×4 IMPLANT
GOWN STRL REUS W/TWL XL LVL3 (GOWN DISPOSABLE) ×10 IMPLANT
HANDPIECE INTERPULSE COAX TIP (DISPOSABLE) ×2
HEMOSTAT SPONGE AVITENE ULTRA (HEMOSTASIS) ×2 IMPLANT
IMMOBILIZER KNEE 20 (SOFTGOODS) ×2
IMMOBILIZER KNEE 20 THIGH 36 (SOFTGOODS) ×1 IMPLANT
MANIFOLD NEPTUNE II (INSTRUMENTS) ×2 IMPLANT
NS IRRIG 1000ML POUR BTL (IV SOLUTION) IMPLANT
PACK TOTAL KNEE CUSTOM (KITS) ×2 IMPLANT
PAD ABD 8X10 STRL (GAUZE/BANDAGES/DRESSINGS) ×1 IMPLANT
PADDING CAST COTTON 6X4 STRL (CAST SUPPLIES) ×2 IMPLANT
POSITIONER SURGICAL ARM (MISCELLANEOUS) ×2 IMPLANT
SET HNDPC FAN SPRY TIP SCT (DISPOSABLE) ×1 IMPLANT
SPONGE GAUZE 2X2 STER 10/PKG (GAUZE/BANDAGES/DRESSINGS)
SPONGE SURGIFOAM ABS GEL 100 (HEMOSTASIS) ×1 IMPLANT
STAPLER VISISTAT (STAPLE) ×1 IMPLANT
STRIP CLOSURE SKIN 1/2X4 (GAUZE/BANDAGES/DRESSINGS) IMPLANT
SUT BONE WAX W31G (SUTURE) ×1 IMPLANT
SUT MNCRL AB 4-0 PS2 18 (SUTURE) IMPLANT
SUT STRATAFIX 0 PDS 27 VIOLET (SUTURE) ×2
SUT VIC AB 1 CT1 27 (SUTURE) ×4
SUT VIC AB 1 CT1 27XBRD ANTBC (SUTURE) ×2 IMPLANT
SUT VIC AB 2-0 CT1 27 (SUTURE) ×6
SUT VIC AB 2-0 CT1 TAPERPNT 27 (SUTURE) ×3 IMPLANT
SUTURE STRATFX 0 PDS 27 VIOLET (SUTURE) ×1 IMPLANT
SYR 50ML LL SCALE MARK (SYRINGE) ×2 IMPLANT
TOWER CARTRIDGE SMART MIX (DISPOSABLE) ×2 IMPLANT
TRAY FOLEY W/METER SILVER 16FR (SET/KITS/TRAYS/PACK) ×2 IMPLANT
WATER STERILE IRR 1500ML POUR (IV SOLUTION) ×2 IMPLANT
WRAP KNEE MAXI GEL POST OP (GAUZE/BANDAGES/DRESSINGS) ×2 IMPLANT
YANKAUER SUCT BULB TIP 10FT TU (MISCELLANEOUS) ×2 IMPLANT

## 2016-03-20 NOTE — Consult Note (Addendum)
   [facility] CM Inpatient Consult   03/20/2016  MELIZZA AMONETT 03-22-1956 [id number]    Came to visit Mrs. Appell at bedside on behalf of [facility] to [facility] Care Management program. Spoke with Mr. Kreuser as as well as Mrs. Bullard was a little groggy s/p surgery. Provided Link to [employer], contact information, 24-hr nurse line magnet. Mrs. Addams denies any [facility] needs at this time. However, she is agreeable to post hospital discharge follow up call. Confirmed best contact number as [phone]. Appreciative of visit.    Marthenia Rolling, MSN-Ed, RN,BSN [facility] Liaison [phone]

## 2016-03-20 NOTE — Care Management Note (Addendum)
Case Management Note  Patient Details  Name: WAVEL LOKKEN MRN: [id number] Date of Birth: October 26, 1955  Subjective/Objective:   60 y.o. F admitted 03/20/2016 for R TKA. Hx L TKA. 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 Reports she used [facility] for therapy  after her previous surgery and would like to use them again if recommended by PT and ordered by MD. CM contacted Manuela Schwartz, rep with [facility], to confirm.                    Action/Plan: CM will need to reconfirm on 03/21/2016 as pt is fresh post op and under the influence of anesthesia.    Expected Discharge Date:                  Expected Discharge Plan:  [place]  In-House Referral:  NA  Discharge planning Services  CM Consult  Post Acute Care Choice:  Home Health Choice offered to:  Patient  DME Arranged:  N/A (Has 3n1 and RW from previous surgery) DME Agency:     HH Arranged:  PT [place] Agency:   Medical sales representative)  Status of Service:  In process, will continue to follow  If discussed at Long Length of Stay Meetings, dates discussed:    Additional Comments:  Delrae Sawyers, RN 03/20/2016, 1:09 PM

## 2016-03-20 NOTE — Interval H&P Note (Signed)
History and Physical Interval Note:  03/20/2016 7:11 AM  [name]  has presented today for surgery, with the diagnosis of right knee djd  The various methods of treatment have been discussed with the patient and family. After consideration of risks, benefits and other options for treatment, the patient has consented to  Procedure(s): RIGHT TOTAL KNEE ARTHROPLASTY (Right) as a surgical intervention .  The patient's history has been reviewed, patient examined, no change in status, stable for surgery.  I have reviewed the patient's chart and labs.  Questions were answered to the patient's satisfaction.     [name] C

## 2016-03-20 NOTE — Discharge Instructions (Signed)
Elevate leg above heart 6x a day for 71minutes each Use knee immobilizer while walking until can SLR x 10 Use knee immobilizer in bed to keep knee in extension  INSTRUCTIONS AFTER JOINT REPLACEMENT   o Remove items at home which could result in a fall. This includes throw rugs or furniture in walking pathways o ICE to the affected joint every three hours while awake for 30 minutes at a time, for at least the first 3-5 days, and then as needed for pain and swelling.  Continue to use ice for pain and swelling. You may notice swelling that will progress down to the foot and ankle.  This is normal after surgery.  Elevate your leg when you are not up walking on it.   o Continue to use the breathing machine you got in the hospital (incentive spirometer) which will help keep your temperature down.  It is common for your temperature to cycle up and down following surgery, especially at night when you are not up moving around and exerting yourself.  The breathing machine keeps your lungs expanded and your temperature down.   DIET:  As you were doing prior to hospitalization, we recommend a well-balanced diet.  DRESSING / WOUND CARE / SHOWERING  You may change your dressing 3-5 days after surgery.  Then change the dressing every day with sterile gauze.  Please use good hand washing techniques before changing the dressing.  Do not use any lotions or creams on the incision until instructed by your surgeon.  ACTIVITY  o Increase activity slowly as tolerated, but follow the weight bearing instructions below.   o No driving for 6 weeks or until further direction given by your physician.  You cannot drive while taking narcotics.  o No lifting or carrying greater than 10 lbs. until further directed by your surgeon. o Avoid periods of inactivity such as sitting longer than an hour when not asleep. This helps prevent blood clots.  o You may return to work once you are authorized by your doctor.     WEIGHT  BEARING   Weight bearing as tolerated with assist device (walker, cane, etc) as directed, use it as long as suggested by your surgeon or therapist, typically at least 4-6 weeks.   EXERCISES  Results after joint replacement surgery are often greatly improved when you follow the exercise, range of motion and muscle strengthening exercises prescribed by your doctor. Safety measures are also important to protect the joint from further injury. Any time any of these exercises cause you to have increased pain or swelling, decrease what you are doing until you are comfortable again and then slowly increase them. If you have problems or questions, call your caregiver or physical therapist for advice.   Rehabilitation is important following a joint replacement. After just a few days of immobilization, the muscles of the leg can become weakened and shrink (atrophy).  These exercises are designed to build up the tone and strength of the thigh and leg muscles and to improve motion. Often times heat used for twenty to thirty minutes before working out will loosen up your tissues and help with improving the range of motion but do not use heat for the first two weeks following surgery (sometimes heat can increase post-operative swelling).   These exercises can be done on a training (exercise) mat, on the floor, on a table or on a bed. Use whatever works the best and is most comfortable for you.    Use  music or television while you are exercising so that the exercises are a pleasant break in your day. This will make your life better with the exercises acting as a break in your routine that you can look forward to.   Perform all exercises about fifteen times, three times per day or as directed.  You should exercise both the operative leg and the other leg as well.  Exercises include:    Quad Sets - Tighten up the muscle on the front of the thigh (Quad) and hold for 5-10 seconds.    Straight Leg Raises - With your  knee straight (if you were given a brace, keep it on), lift the leg to 60 degrees, hold for 3 seconds, and slowly lower the leg.  Perform this exercise against resistance later as your leg gets stronger.   Leg Slides: Lying on your back, slowly slide your foot toward your buttocks, bending your knee up off the floor (only go as far as is comfortable). Then slowly slide your foot back down until your leg is flat on the floor again.   Angel Wings: Lying on your back spread your legs to the side as far apart as you can without causing discomfort.   Hamstring Strength:  Lying on your back, push your heel against the floor with your leg straight by tightening up the muscles of your buttocks.  Repeat, but this time bend your knee to a comfortable angle, and push your heel against the floor.  You may put a pillow under the heel to make it more comfortable if necessary.   A rehabilitation program following joint replacement surgery can speed recovery and prevent re-injury in the future due to weakened muscles. Contact your doctor or a physical therapist for more information on knee rehabilitation.    CONSTIPATION  Constipation is defined medically as fewer than three stools per week and severe constipation as less than one stool per week.  Even if you have a regular bowel pattern at home, your normal regimen is likely to be disrupted due to multiple reasons following surgery.  Combination of anesthesia, postoperative narcotics, change in appetite and fluid intake all can affect your bowels.   YOU MUST use at least one of the following options; they are listed in order of increasing strength to get the job done.  They are all available over the counter, and you may need to use some, POSSIBLY even all of these options:    Drink plenty of fluids (prune juice may be helpful) and high fiber foods Colace 100 mg by mouth twice a day  Senokot for constipation as directed and as needed Dulcolax (bisacodyl), take  with full glass of water  Miralax (polyethylene glycol) once or twice a day as needed.  If you have tried all these things and are unable to have a bowel movement in the first 3-4 days after surgery call either your surgeon or your primary doctor.    If you experience loose stools or diarrhea, hold the medications until you stool forms back up.  If your symptoms do not get better within 1 week or if they get worse, check with your doctor.  If you experience "the worst abdominal pain ever" or develop nausea or vomiting, please contact the office immediately for further recommendations for treatment.   ITCHING:  If you experience itching with your medications, try taking only a single pain pill, or even half a pain pill at a time.  You can also use  Benadryl over the counter for itching or also to help with sleep.  ° °TED HOSE STOCKINGS:  Use stockings on both legs until for at least 2 weeks or as directed by physician office. They may be removed at night for sleeping. ° °MEDICATIONS:  See your medication summary on the “After Visit Summary” that nursing will review with you.  You may have some home medications which will be placed on hold until you complete the course of blood thinner medication.  It is important for you to complete the blood thinner medication as prescribed. ° °PRECAUTIONS:  If you experience chest pain or shortness of breath - call 911 immediately for transfer to the hospital emergency department.  ° °If you develop a fever greater that 101 F, purulent drainage from wound, increased redness or drainage from wound, foul odor from the wound/dressing, or calf pain - CONTACT YOUR SURGEON.   °                                                °FOLLOW-UP APPOINTMENTS:  If you do not already have a post-op appointment, please call the office for an appointment to be seen by your surgeon.  Guidelines for how soon to be seen are listed in your “After Visit Summary”, but are typically between 1-4 weeks  after surgery. ° °OTHER INSTRUCTIONS:  ° °Knee Replacement:  Do not place pillow under knee, focus on keeping the knee straight while resting. CPM instructions: 0-90 degrees, 2 hours in the morning, 2 hours in the afternoon, and 2 hours in the evening. Place foam block, curve side up under heel at all times except when in CPM or when walking.  DO NOT modify, tear, cut, or change the foam block in any way. ° °MAKE SURE YOU:  °• Understand these instructions.  °• Get help right away if you are not doing well or get worse.  ° ° °Thank you for letting us be a part of your medical care team.  It is a privilege we respect greatly.  We hope these instructions will help you stay on track for a fast and full recovery!  ° °

## 2016-03-20 NOTE — Evaluation (Signed)
Physical Therapy Evaluation Patient Details Name: [name] MRN: [id number] DOB: 1955/06/12 Today's Date: 03/20/2016   History of Present Illness  Pt s/p R TKR and hx of L TKR (14) and breast CA with bil mastectomy  Clinical Impression  Pt s/p R TKR presents with decreased R LE strength/ROM and post op pain limiting functional mobility.  Pt should progress to dc home with family assist and HHPT follow up.    Follow Up Recommendations Home health PT    Equipment Recommendations  None recommended by PT    Recommendations for Other Services OT consult     Precautions / Restrictions Precautions Precautions: Knee;Fall Required Braces or Orthoses: Knee Immobilizer - Right Knee Immobilizer - Right: Discontinue once straight leg raise with < 10 degree lag Restrictions Weight Bearing Restrictions: No Other Position/Activity Restrictions: WBAT      Mobility  Bed Mobility Overal bed mobility: Needs Assistance Bed Mobility: Supine to Sit;Sit to Supine     Supine to sit: Min assist Sit to supine: Min assist   General bed mobility comments: cues for sequence and use of L LE to self assist  Transfers Overall transfer level: Needs assistance Equipment used: Rolling walker (2 wheeled) Transfers: Sit to/from Stand Sit to Stand: Min assist         General transfer comment: cues for LE management and use of UEs to self assist  Ambulation/Gait Ambulation/Gait assistance: Min assist Ambulation Distance (Feet): 38 Feet Assistive device: Rolling walker (2 wheeled) Gait Pattern/deviations: Step-to pattern;Decreased step length - right;Decreased step length - left;Shuffle;Trunk flexed Gait velocity: decr Gait velocity interpretation: Below normal speed for age/gender General Gait Details: cues for posture, sequence and position from [employer]            Wheelchair Mobility    Modified Rankin (Stroke Patients Only)       Balance                                              Pertinent Vitals/Pain Pain Assessment: 0-10 Pain Score: 4  Pain Location: R knee` Pain Descriptors / Indicators: Aching;Sore Pain Intervention(s): Limited activity within patient's tolerance;Monitored during session;Premedicated before session;Ice applied    Home Living Family/patient expects to be discharged to:: Private residence Living Arrangements: Spouse/significant other Available Help at Discharge: Family Type of Home: House Home Access: Stairs to enter Entrance Stairs-Rails: Right Entrance Stairs-Number of Steps: 4 Home Layout: Two level;Able to live on main level with bedroom/bathroom   Additional Comments: 1/2 bath and single bed on main level    Prior Function Level of Independence: Independent         Comments: Works full time as Therapist, sports in cath lab     [employer]        Extremity/Trunk Assessment   Upper Extremity Assessment: Overall WFL for tasks assessed           Lower Extremity Assessment: Overall WFL for tasks assessed         Communication   Communication: No difficulties  Cognition Arousal/Alertness: Awake/alert Behavior During Therapy: WFL for tasks assessed/performed Overall Cognitive Status: Within Functional Limits for tasks assessed                      General Comments      Exercises Total Joint Exercises Ankle Circles/Pumps: AROM;Both;10 reps;Supine   Assessment/Plan  PT Assessment Patient needs continued PT services  PT Problem List Decreased strength;Decreased range of motion;Decreased activity tolerance;Decreased mobility;Decreased knowledge of use of DME;Pain          PT Treatment Interventions DME instruction;Gait training;Stair training;Functional mobility training;Therapeutic activities;Therapeutic exercise;Patient/family education    PT Goals (Current goals can be found in the Care Plan section)  Acute Rehab PT Goals Patient Stated Goal: Regain IND PT Goal Formulation:  With patient Time For Goal Achievement: 03/25/16 Potential to Achieve Goals: Good    Frequency 7X/week   Barriers to discharge        Co-evaluation               End of Session Equipment Utilized During Treatment: Gait belt;Right knee immobilizer Activity Tolerance: Patient tolerated treatment well Patient left: in bed;with call bell/phone within reach;with family/visitor present Nurse Communication: Mobility status         Time: [id number] PT Time Calculation (min) (ACUTE ONLY): 31 min   Charges:   PT Evaluation $PT Eval Low Complexity: 1 Procedure PT Treatments $Gait Training: 8-22 mins   PT G Codes:        Becka Lagasse 03/22/16, 4:14 PM

## 2016-03-20 NOTE — Brief Op Note (Signed)
03/20/2016  9:31 AM  PATIENT:  [name]  60 y.o. female  PRE-OPERATIVE DIAGNOSIS:  right knee degenerative joint disease  POST-OPERATIVE DIAGNOSIS:  right knee degenerative joint disease  PROCEDURE:  Procedure(s): RIGHT TOTAL KNEE ARTHROPLASTY (Right)  SURGEON:  Surgeon(s) and Role:    * Susa Day, MD - Primary  PHYSICIAN ASSISTANT:   ASSISTANTS: Bissell   ANESTHESIA:   general  EBL:  Total I/O In: 1000 [I.V.:1000] Out: 150 [Urine:125; Blood:25]  BLOOD ADMINISTERED:none  DRAINS: none   LOCAL MEDICATIONS USED:  MARCAINE     SPECIMEN:  No Specimen  DISPOSITION OF SPECIMEN:  N/A  COUNTS:  YES  TOURNIQUET:   Total Tourniquet Time Documented: Thigh (Right) - 74 minutes Total: Thigh (Right) - 74 minutes   DICTATION: .Other Dictation: Dictation Number  [id number]  PLAN OF CARE: Admit to inpatient   PATIENT DISPOSITION:  PACU - hemodynamically stable.   Delay start of Pharmacological VTE agent (>24hrs) due to surgical blood loss or risk of bleeding: no

## 2016-03-20 NOTE — H&P (View-Only) (Signed)
[name] is an 60 y.o. female.    H&P date: 03/11/16  Chief Complaint: R knee pain HPI: She is still having ongoing right knee pain, which was refractory to the recent series of Euflexxa as well as recent cortisone, wakes her up in the middle of the night, about 3 a.m. every night with pain. She is limping on a regular basis, has trouble getting in and out of the car, has trouble getting around at work. She has been taking tramadol as needed for more severe pain. She did have a recent body scan with Dr. Philipp Ovens and was told that that was good and she was verbally cleared by Dr. Philipp Ovens to proceed with a knee replacement, although we do not have written clearance yet at this point. She is currently on Eliquis for DVT prophylaxis. She is still on chemotherapy Kadcyla, which she gets an infusion of every three weeks. Last time that we did her left total knee replacement she had a reaction to the adhesive of the Aquacel, extensive swelling with that postoperatively. She would like to discuss having her right total knee replacement done. We have discussed this previously if refractory to conservative treatment. She does feel that the right knee is to the point where it is interfering with regular activities due to pain.  Past Medical History:  Diagnosis Date  . 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 Arthritis KNEES  . Chronic anticoagulation HX  PORT-A-CATH CLOT   2010 - HAS BEEN ON LOVENOX SINCE THE CLOT  . Elevated blood pressure reading without diagnosis of hypertension    PT MONITORS HER B/P AT HOME  . 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 Skin cancer of anterior chest BREAST CANCER PRIMARY W/ METS TO CHEST WALL SKIN CANCER--  CHEMOTHERAPY EVERY 3 WEEKS AT [facility]  bladder spasms  Osteoarthritis  Breast Cancer  dx 2009, s/p chemo, mastectomy, XRT. mets  to chest wall and skin. Follow[facility] by Dr. 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 CHEST WALL TUMOR EXCISION    . KNEE ARTHROSCOPY  02/13/2012   Procedure: ARTHROSCOPY KNEE;  Surgeon: Johnn Hai, MD;  Location: [facility];  Service: Orthopedics;  Laterality: Left;  WITH DEBRIDEMENt   . KNEE ARTHROSCOPY WITH LATERAL MENISECTOMY  02/13/2012   Procedure: KNEE ARTHROSCOPY WITH LATERAL MENISECTOMY;  Surgeon: Johnn Hai, MD;  Location: [facility];  Service: Orthopedics;;  partial  . LEFT MODIFIED RADICAL MASTECTOMY/ RIGHT TOTAL MASTECTOMY  11-13-2007   LEFT BREAST CANCER W/ AXILLARY LYMPH NODE METASTASIS AND POST NEOADJUVANT CHEMO  . PLACEMENT PORT-A-CATH  06-22-2007  . SKIN GRAFT     chest-post tumor removal  . TOTAL KNEE ARTHROPLASTY Left 09/23/2012   Procedure: LEFT TOTAL KNEE ARTHROPLASTY;  Surgeon: Johnn Hai, MD;  Location: [facility];  Service: Orthopedics;  Laterality: Left;  . TRANSTHORACIC ECHOCARDIOGRAM  11-18-2011  DR BENSIMHON (ECHO EVERY 3 MONTHS)   HX CHEMO INDUCED CARDIOTOXICITY/  LVSF NORMAL/ EF 55-50%/ MILD MITRIAL VALVE REGURG./ MILDLY INCREASED SYSTOLIC PRESSURE OF PULMONARY ARTERIES    Family History  Problem Relation Age of Onset  . Heart disease Mother  due to mitral valve regurgiation,   . Cancer Mother     breast  . Parkinsonism Father   . Alcohol abuse Paternal Grandfather    Social History Tobacco use  Never smoker. 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 Tape Hives    Can tolerate paper tape  . Other Rash    STERI STRIPS - Blisters  . Penicillins Rash    Denies airway involvement  Dilaudid- vomiting  Medication History  TraMADol HCl (50MG Tablet, 1 (one) Oral every six hours as needed for pain, Taken starting 12/04/2015) Active. Lidoderm (5% Patch, 1 (one) Patch External apply to affected area no more than 12 hours in a 24 hour period, Taken starting 01/17/2016) Active. (1 patch, no more than 12 hours in a 24 hour period) Eliquis (5MG Tablet, Oral) Active. Enablex (15MG Tablet ER 24HR, Oral) Active. Neurontin (300MG Capsule, Oral) Active. Ambien (5MG Tablet, Oral) Active. Kadcyla (Intravenous) Specific strength unknown - Active. Vitamin D (Oral) Specific strength unknown - Active. CeleBREX (Oral) Specific strength unknown - Active. Medications Reconciled  Review of Systems  Constitutional: Negative.   HENT: Negative.   Eyes: Negative.   Respiratory: Negative.   Cardiovascular: Positive for leg swelling.  Gastrointestinal: Negative.   Genitourinary: Negative.   Musculoskeletal: Positive for falls and joint pain.  Skin: Negative.   Neurological: Positive for sensory change.    Last menstrual period 06/22/2007. Physical Exam  Constitutional: She is oriented to person, place, and time. She appears well-developed. She appears distressed.  HENT:  Head: Normocephalic.  Eyes: Pupils are equal, round, and reactive to light.  Neck: Normal range of motion.  Cardiovascular: Normal rate.   Respiratory: Effort normal.  GI: Soft.  Musculoskeletal:  On exam, she is well nourished, well developed, has an antalgic  gait. She is awake, alert, and oriented x3, in no acute distress. Bilateral lower leg edema noted.  Right knee exam, she is tender to palpation through both the medial and lateral joint line. Mild effusion. She is able to fully extend. Flexion is limited due to pain approximately 100 degrees. No calf pain or sign of DVT. No pain or laxity with varus or valgus stress. She does have a valgus thrust with ambulation.  Neurological: She is alert and oriented to person, place, and time.    Prior x-rays reviewed. There has been some settling and aseptic loosening on the left knee tibial component specifically. Her right knee is bone on bone laterally with a valgus deformity, also with some patellofemoral wear.  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  For the right knee, she has ongoing pain from end stage DJD. Instability is impacting her ADL, waking up from pain at night. At this point, as she  has failed conservative treatment for the right knee, we have mutually agreed to proceed with a right total knee replacement. We have obtained clearance from her cardiologist, and her oncologist, Dr. Philipp Ovens, and I have asked Dr. Philipp Ovens to make specific recommendations regarding the timing of her chemo, Kadcyla, as she currently gets that infused every three weeks and we will need to time that appropriately around the time of surgery (can perform surgery Thursday after infusion Monday). She is on Eliquis. We will need to hold that preoperatively (48 hrs). We will plan on vancomycin. Staples as opposed to sutures given her reaction to Steri-Strips and will use an old fashioned dressing with 4x4s, and ABDs as opposed to an Aquacel with her adhesive allergy that she experienced last time. Plan for preop labs, CBC,  sed rate, LFTs, CRP in addition to the standard. Postoperatively, we will plan for Percocet for pain control, Robaxin,MiraLax, Colace, and she will resume her Eliquis. Gay Filler and I mutually agreed to waive her H and P. If she changes her mind and would like to come in for a preop H and P, she will let me know. She did well with general anesthesia, last time was with scopolamine patch. We will plan to repeat that. I have given her a note to return to work tomorrow. She does plan to take short term disability for 12 weeks postoperatively and then if able to return earlier, she can, to avoid going back too early causing swelling. She will follow up 10 to 14 days postop. The patient was seen in conjunction with Dr. Tonita Cong today.  Plan right total knee replacement  Cecilie Kicks., PA-C for Dr. Tonita Cong 03/11/2016, 10:31 AM

## 2016-03-20 NOTE — Progress Notes (Signed)
CSW consulted for SNF placement. PN reviewed. PT recommends HHPT at d/c. RNCM will assist with d/c planning needs.  Werner Lean LCSW [phone]

## 2016-03-20 NOTE — Transfer of Care (Signed)
Immediate Anesthesia Transfer of Care Note  Patient: [name]  Procedure(s) Performed: Procedure(s): RIGHT TOTAL KNEE ARTHROPLASTY (Right)  Patient Location: PACU  Anesthesia Type:General  Level of Consciousness:  sedated, patient cooperative and responds to stimulation  Airway & Oxygen Therapy:Patient Spontanous Breathing and Patient connected to face mask oxgen  Post-op Assessment:  Report given to PACU RN and Post -op Vital signs reviewed and stable  Post vital signs:  Reviewed and stable  Last Vitals:  Vitals:   03/20/16 0543  BP: 127/77  Pulse: 70  Resp: 18  Temp: [id number] C    Complications: No apparent anesthesia complications

## 2016-03-20 NOTE — Anesthesia Procedure Notes (Signed)
Procedure Name: Intubation Date/Time: 03/20/2016 7:30 AM Performed by: Jahmir Salo, Virgel Gess Pre-anesthesia Checklist: Patient identified, Emergency Drugs available, Suction available, Patient being monitored and Timeout performed Patient Re-evaluated:Patient Re-evaluated prior to inductionOxygen Delivery Method: Circle system utilized Preoxygenation: Pre-oxygenation with 100% oxygen Intubation Type: IV induction Ventilation: Mask ventilation without difficulty Laryngoscope Size: Mac and 4 Grade View: Grade III Tube type: Oral Tube size: 7.0 mm Number of attempts: 1 Airway Equipment and Method: Stylet Placement Confirmation: ETT inserted through vocal cords under direct vision,  positive ETCO2,  CO2 detector and breath sounds checked- equal and bilateral Secured at: 22 cm Tube secured with: Tape Dental Injury: Teeth and Oropharynx as per pre-operative assessment  Difficulty Due To: Difficulty was anticipated Comments: Anterior view G3, easy mask, ett easy pass first attempt, ATOI.

## 2016-03-20 NOTE — Anesthesia Preprocedure Evaluation (Signed)
Anesthesia Evaluation  Patient identified by MRN, date of birth, ID band Patient awake    Reviewed: Allergy & Precautions, NPO status , Patient's Chart, lab work & pertinent test results  History of Anesthesia Complications (+) PONV and history of anesthetic complications  Airway Mallampati: II  TM Distance: >3 FB Neck ROM: Full    Dental no notable dental hx.    Pulmonary neg pulmonary ROS,    Pulmonary exam normal breath sounds clear to auscultation       Cardiovascular + DOE  negative cardio ROS Normal cardiovascular exam+ Valvular Problems/Murmurs  Rhythm:Regular Rate:Normal  ECHO 03-03-16: Study Conclusions  - Left ventricle: The cavity size was normal. Wall thickness was   normal. Systolic function was normal. The estimated ejection   fraction was in the range of 60% to 65%. Doppler parameters are   consistent with abnormal left ventricular relaxation (grade 1   diastolic dysfunction). - Mitral valve: There was mild regurgitation. - Pulmonary arteries: PA peak pressure: 49 mm Hg (S).   Neuro/Psych PSYCHIATRIC DISORDERS  Neuromuscular disease negative neurological ROS     GI/Hepatic negative GI ROS, Neg liver ROS,   Endo/Other  negative endocrine ROS  Renal/GU negative Renal ROS  negative genitourinary   Musculoskeletal  (+) Arthritis ,   Abdominal   Peds negative pediatric ROS (+)  Hematology  (+) anemia ,   Anesthesia Other Findings   Reproductive/Obstetrics negative OB ROS                             Anesthesia Physical Anesthesia Plan  ASA: II  Anesthesia Plan: General   Post-op Pain Management:    Induction: Intravenous  Airway Management Planned: Oral ETT  Additional Equipment:   Intra-op Plan:   Post-operative Plan: Extubation in OR  Informed Consent: I have reviewed the patients History and Physical, chart, labs and discussed the procedure including the  risks, benefits and alternatives for the proposed anesthesia with the patient or authorized representative who has indicated his/her understanding and acceptance.   Dental advisory given  Plan Discussed with: CRNA  Anesthesia Plan Comments: (Last Apixaban dose was 6 PM on Monday. Per ASRA recommendations, spinal and deep blocks not recommended. Plan general anesthesia and patient consents.)        Anesthesia Quick Evaluation

## 2016-03-20 NOTE — Anesthesia Postprocedure Evaluation (Signed)
Anesthesia Post Note  Patient: [name]  Procedure(s) Performed: Procedure(s) (LRB): RIGHT TOTAL KNEE ARTHROPLASTY (Right)  Patient location during evaluation: PACU Anesthesia Type: General Level of consciousness: awake and alert Pain management: pain level controlled Vital Signs Assessment: post-procedure vital signs reviewed and stable Respiratory status: spontaneous breathing, nonlabored ventilation, respiratory function stable and patient connected to nasal cannula oxygen Cardiovascular status: blood pressure returned to baseline and stable Postop Assessment: no signs of nausea or vomiting Anesthetic complications: no    Last Vitals:  Vitals:   03/20/16 1202 03/20/16 1257  BP: (!) 145/76 (!) 148/73  Pulse: (!) 102 (!) 108  Resp: 14 14  Temp: 36.9 C 36.9 C    Last Pain:  Vitals:   03/20/16 1342  TempSrc:   PainSc: 7                  Rashena Dowling J

## 2016-03-21 LAB — BASIC METABOLIC PANEL
Anion gap: 9 (ref 5–15)
BUN: 10 mg/dL (ref 6–20)
CALCIUM: 8.4 mg/dL — AB (ref 8.9–10.3)
CO2: 27 mmol/L (ref 22–32)
Chloride: 95 mmol/L — ABNORMAL LOW (ref 101–111)
Creatinine, Ser: 0.56 mg/dL (ref 0.44–1.00)
GFR calc Af Amer: >60 mL/min (ref >60)
GLUCOSE: 158 mg/dL — AB (ref 65–99)
Potassium: 4.2 mmol/L (ref 3.5–5.1)
Sodium: 131 mmol/L — ABNORMAL LOW (ref 135–145)

## 2016-03-21 LAB — CBC
HEMATOCRIT: 28.2 % — AB (ref 36.0–46.0)
Hemoglobin: 9.3 g/dL — ABNORMAL LOW (ref 12.0–15.0)
MCH: 30.5 pg (ref 26.0–34.0)
MCHC: 33 g/dL (ref 30.0–36.0)
MCV: 92.5 fL (ref 78.0–100.0)
PLATELETS: 122 10*3/uL — AB (ref 150–400)
RBC: 3.05 MIL/uL — ABNORMAL LOW (ref 3.87–5.11)
RDW: 16.3 % — AB (ref 11.5–15.5)
WBC: 10 10*3/uL (ref 4.0–10.5)

## 2016-03-21 NOTE — Progress Notes (Signed)
Patient ID: [name], female   DOB: 25-Feb-1956, 60 y.o.   MRN: [id number] Subjective: 1 Day Post-Op Procedure(s) (LRB): RIGHT TOTAL KNEE ARTHROPLASTY (Right) Patient reports pain as mild.    Patient has complaints of Right knee pain, mild. No other c/o.  We will start therapy today. Plan is to go home with HHPT after hospital stay.  Objective: Vital signs in last 24 hours: Temp:  [97.9 F (36.6 C)-99.6 F (37.6 C)] 99.6 F (37.6 C) (10/27 0500) Pulse Rate:  [98-119] 119 (10/27 0500) Resp:  [12-21] 18 (10/27 0500) BP: (105-160)/(44-82) 106/44 (10/27 0500) SpO2:  [99 %-100 %] 100 % (10/27 0500)  Intake/Output from previous day:  Intake/Output Summary (Last 24 hours) at 03/21/16 0742 Last data filed at 03/21/16 0736  Gross per 24 hour  Intake          3949.17 ml  Output             2760 ml  Net          1189.17 ml    Intake/Output this shift: Total I/O In: 240 [P.O.:240] Out: -   Labs: Results for orders placed or performed during the hospital encounter of 03/20/16  CBC  Result Value Ref Range   WBC 10.0 4.0 - 10.5 K/uL   RBC 3.05 (L) 3.87 - 5.11 MIL/uL   Hemoglobin 9.3 (L) 12.0 - 15.0 g/dL   HCT 28.2 (L) 36.0 - 46.0 %   MCV 92.5 78.0 - 100.0 fL   MCH 30.5 26.0 - 34.0 pg   MCHC 33.0 30.0 - 36.0 g/dL   RDW 16.3 (H) 11.5 - 15.5 %   Platelets 122 (L) 150 - 400 K/uL  Basic metabolic panel  Result Value Ref Range   Sodium 131 (L) 135 - 145 mmol/L   Potassium 4.2 3.5 - 5.1 mmol/L   Chloride 95 (L) 101 - 111 mmol/L   CO2 27 22 - 32 mmol/L   Glucose, Bld 158 (H) 65 - 99 mg/dL   BUN 10 6 - 20 mg/dL   Creatinine, Ser 0.56 0.44 - 1.00 mg/dL   Calcium 8.4 (L) 8.9 - 10.3 mg/dL   GFR calc non Af Amer >60 >60 mL/min   GFR calc Af Amer >60 >60 mL/min   Anion gap 9 5 - 15    Exam - Neurologically intact ABD soft Neurovascular intact Sensation intact distally Intact pulses distally Dorsiflexion/Plantar flexion intact Incision: dressing C/D/I and no drainage No  cellulitis present Compartment soft no calf pain or sign of DVT Dressing - clean, dry, no drainage Motor function intact - moving foot and toes well on exam.   Assessment/Plan: 1 Day Post-Op Procedure(s) (LRB): RIGHT TOTAL KNEE ARTHROPLASTY (Right)  Advance diet Up with therapy D/C IV fluids Past Medical History:  Diagnosis Date  . 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 Arthritis KNEES   right knee, hx. past left knee replacemnt.  . Blood clot in vein    around Portacath right chest-tx. Eliquis about a yr., prior Lovenox.  . Chronic anticoagulation HX  PORT-A-CATH CLOT   2010 - HAS BEEN ON LOVENOX SINCE THE CLOT  . Elevated blood pressure reading without diagnosis of hypertension    PT MONITORS HER B/P AT HOME  . Heart murmur   . 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 PONV (  postoperative nausea and vomiting)   . Skin cancer of anterior chest BREAST CANCER PRIMARY W/ METS TO CHEST WALL SKIN CANCER--  CHEMOTHERAPY EVERY 3 WEEKS AT [facility]  . Status post skin flap graft    right chest wall with metastatis right anterior chest -no drainage or open wound.    DVT Prophylaxis - Eliquis (on chronically for DVT ppx due to prior port-a-cath clot)- increase to full dose today Weight-Bearing as tolerated to Right leg No vaccines. Plan D/C home with HHPT when ready either tomorrow or Sunday depending on pain and progress with PT Will discuss with Dr. Mliss Fritz, Conley Rolls. 03/21/2016, 7:42 AM

## 2016-03-21 NOTE — Progress Notes (Signed)
Physical Therapy Treatment Patient Details Name: [name] MRN: [id number] DOB: 23-Apr-1956 Today's Date: 03/21/2016    History of Present Illness Pt s/p R TKR and hx of L TKR (14) and breast CA with bil mastectomy    PT Comments    Marked improvement in activity tolerance vs session this am  Follow Up Recommendations  Home health PT     Equipment Recommendations  None recommended by PT    Recommendations for Other Services OT consult     Precautions / Restrictions Precautions Precautions: Knee;Fall Required Braces or Orthoses: Knee Immobilizer - Right Knee Immobilizer - Right: Discontinue once straight leg raise with < 10 degree lag Restrictions Weight Bearing Restrictions: No RLE Weight Bearing: Weight bearing as tolerated    Mobility  Bed Mobility Overal bed mobility: Needs Assistance Bed Mobility: Supine to Sit     Supine to sit: Min assist     General bed mobility comments: cues for sequence and use of L LE to self assist  Transfers Overall transfer level: Needs assistance Equipment used: Rolling walker (2 wheeled) Transfers: Sit to/from Stand Sit to Stand: Mod assist Stand pivot transfers: Min assist;Mod assist       General transfer comment: cues for LE management and use of UEs to self assist  Ambulation/Gait Ambulation/Gait assistance: Min assist Ambulation Distance (Feet): 68 Feet Assistive device: Rolling walker (2 wheeled) Gait Pattern/deviations: Step-to pattern;Decreased step length - left;Decreased step length - right;Shuffle;Trunk flexed Gait velocity: decr Gait velocity interpretation: Below normal speed for age/gender General Gait Details: cues for posture, sequence and position from RW;    Science writer    Modified Rankin (Stroke Patients Only)       Balance                                    Cognition Arousal/Alertness: Awake/alert Behavior During Therapy: WFL for tasks  assessed/performed Overall Cognitive Status: Within Functional Limits for tasks assessed                      Exercises      General Comments        Pertinent Vitals/Pain Pain Assessment: 0-10 Pain Score: 5  Pain Location: R knee Pain Descriptors / Indicators: Aching;Sore Pain Intervention(s): Limited activity within patient's tolerance;Monitored during session;Premedicated before session;Ice applied    Home Living                      Prior Function            PT Goals (current goals can now be found in the care plan section) Acute Rehab PT Goals Patient Stated Goal: Regain IND PT Goal Formulation: With patient Time For Goal Achievement: 03/25/16 Potential to Achieve Goals: Good Progress towards PT goals: Progressing toward goals    Frequency    7X/week      PT Plan Current plan remains appropriate    Co-evaluation             End of Session Equipment Utilized During Treatment: Gait belt;Right knee immobilizer Activity Tolerance: Patient tolerated treatment well Patient left: in chair;with call bell/phone within reach;with family/visitor present     Time: 1211-1245 PT Time Calculation (min) (ACUTE ONLY): 34 min  Charges:  $Gait Training: 8-22 mins $Therapeutic Activity: 8-22 mins  G Codes:      Romulus Hanrahan 2016/04/10, 3:00 PM

## 2016-03-21 NOTE — Progress Notes (Signed)
OT Cancellation Note  Patient Details Name: [name] MRN: [id number] DOB: Jun 01, 1955   Cancelled Treatment:    Reason Eval/Treat Not Completed: Other (comment).  Pt just got lunch. Will check back tomorrow morning.  Steven Basso 03/21/2016, 12:53 PM  Lesle Chris, OTR/L [phone] 03/21/2016

## 2016-03-21 NOTE — Op Note (Signed)
NAMEHYLDA, [name] NO.:  [id number]  MEDICAL RECORD NO.:  [id number]  LOCATION:  [id number]                         FACILITY:  [facility]  PHYSICIAN:  [name], M.D.    DATE OF BIRTH:  Feb 12, 1956  DATE OF PROCEDURE:  03/20/2016 DATE OF DISCHARGE:                              OPERATIVE REPORT   PREOPERATIVE DIAGNOSES:  End-stage osteoarthrosis, valgus deformity of the right knee.  POSTOPERATIVE DIAGNOSES:  End-stage osteoarthrosis, valgus deformity of the right knee.  PROCEDURE PERFORMED:  Right total knee arthroplasty.  COMPONENTS:  DePuy Attune, 5 femur, 4 tibia, 5-mm insert, 38 patella.  ANESTHESIA:  General.  ASSISTANT:  Cleophas Dunker, PA, who was required and used throughout the case for patient positioning, closure, exposure.  HISTORY:  A 60 year old, end-stage osteoarthrosis, valgus deformity, refractory to conservative treatment.  The patient has been maintained on chronic chemotherapy for her breast cancer.  She has had instability of the knee, bone-on-bone arthrosis, severe pain, unable to ambulate and she was indicated for replacement of the degenerated joint.  Risks and benefits were discussed including bleeding, infection, damage to the neurovascular structures, suboptimal range of motion, DVT, PE, anesthetic complications, etc.  TECHNIQUE:  With the patient in supine position, after induction of adequate general anesthesia, 1 g of vancomycin, the right lower extremity was prepped and draped and exsanguinated in usual sterile fashion.  Thigh tourniquet was inflated to 275 mmHg.  Midline incision was made over the knee.  Subcutaneous tissue was dissected. Electrocautery was utilized to achieve hemostasis.  Median parapatellar arthrotomy performed.  Patella everted and knee flexed.  Copious portion of the clear synovial fluid evacuated, extensive synovectomy was performed.  Tricompartmental bone-on-bone arthrosis, protecting the patella.   Medial femoral condyle and lateral femoral condyle were noted. Osteophytes were removed with a Beyer rongeur.  Remnants of the medial and lateral menisci were removed as well.  Step drill was then utilized to enter the femoral condyle and the appropriate angle was irrigated and a 5-degree right was utilized with 10 off the distal femur due to flexion contracture.  This was pinned and oscillating saw performed this cut.  We then sized the femur.  There was no deficiency in the lateral condyle along the epicondylar access, the rotation was examined.  Pinned in 3 degrees of external rotation.  She had some slight asymmetry to her femur.  I spent additional time confirming the appropriate rotation of the femur, this was then pinned.  This was sized at a 5 and we then lowered the block to resect less of the posterior femoral condyle. Performed our anterior, posterior and chamfer cuts.  We did not notch the femur, protected the soft tissues at all times.  We then subluxed the tibia.  External alignment guide, 2 off the defect medially, bisected the tibiotalar joint, 3-degree slope, parallel to the shaft. We performed our cut with an oscillating saw.  The block in extension seen tight as it was in flexion.  We therefore took an additional 2 mm off the tibia and then we had adequate flexion and extension gaps. Following this, we flexed to the tibia, subluxed it, maximized the surface of the  tibia with a 4 tray, just to the medial aspect of the tibial tubercle.  She had some external rotation of the tibial tubercle. This was then pinned.  We drilled essentially, used our punch guide.  We then performed our box cut bisecting the canal.  Then, we placed our trial femur and tibia.  With the 5-mm insert, we had full extension and full flexion.  Good stability, varus and valgus stressing 0-30 degrees. We turned our attention to the patella.  It was actually scalloped, bowl shaped.  Multiple measurement  configurations, we used a 7.5 on top of that, used our external alignment, external jig, performed our cut.  We felt that it was too thick.  We readjusted this and then recut an additional 2 mm.  The residual thickness was 15.  We sized it to a 38, drilling our PEG holes, medializing them, placed a trial patellar complement.  We had good patellofemoral tracking.  Then, removed all instrumentations.  Remnants of medial and lateral menisci posteriorly were removed, cauterized the geniculates, popliteus was intact. Pulsatile lavage was used in the joint.  Copiously irrigated the wound. With pulsatile lavage, flexed the knee, dried the all surfaces thoroughly, mixed the cement in back table in appropriate fashion, injected into the tibial canal and digitally pressurizing it, impacted our tibial tray, redundant cement removed.  We then impacted our femoral component, redundant cement removed.  We drilled our lug holes prior to this.  Placed a trial 5 insert, reduced it and held in extension with axial load throughout the curing of the case.  Cemented our patellar complement with a patellar jig.  After appropriate curing of the cement, infiltrated the periarticular tissues with lidocaine, epinephrine and Marcaine.  Then, also used a copious irrigation with antibiotic irrigation after curing the cement.  We let the tourniquet down at 71 minutes, cauterized any bleeders.  Flexed the knee and removed the insert, meticulously removed all cement, copiously irrigated the wound, placed a 5 rotating platform permanent insert, full extension, full flexion, good stability, varus and valgus stressing 0-30 degrees, negative anterior drawer and excellent patellofemoral tracking.  We then reapproximated the patellar ligament with patellar arthrotomy with #1 Vicryl in interrupted figure-of-eight sutures and then oversewed that with a running Stratafix, subcu with 2-0, and skin with staples due to an allergy.   Wound was dressed sterilely.  Extubated without difficulty, and transported to the recovery room in satisfactory condition.  The patient tolerated the procedure well.  No complications.  Assistant, Cleophas Dunker, [place].  Minimal blood loss.     [name], M.D.     Geralynn Rile  D:  03/20/2016  T:  03/21/2016  Job:  [id number]

## 2016-03-21 NOTE — Progress Notes (Signed)
Physical Therapy Treatment Patient Details Name: [name] MRN: [id number] DOB: 1955-12-15 Today's Date: 03/21/2016    History of Present Illness Pt s/p R TKR and hx of L TKR (14) and breast CA with bil mastectomy    PT Comments    Pt ltd this am by increasing dizziness with attempts to ambulate.  RN aware with BP on return to supine 111/46  Follow Up Recommendations  Home health PT     Equipment Recommendations  None recommended by PT    Recommendations for Other Services OT consult     Precautions / Restrictions Precautions Precautions: Knee;Fall Required Braces or Orthoses: Knee Immobilizer - Right Knee Immobilizer - Right: Discontinue once straight leg raise with < 10 degree lag Restrictions Weight Bearing Restrictions: No RLE Weight Bearing: Weight bearing as tolerated    Mobility  Bed Mobility Overal bed mobility: Needs Assistance Bed Mobility: Supine to Sit;Sit to Supine     Supine to sit: Min assist Sit to supine: Min assist;Mod assist   General bed mobility comments: cues for sequence and use of L LE to self assist  Transfers Overall transfer level: Needs assistance Equipment used: Rolling walker (2 wheeled) Transfers: Sit to/from Stand Sit to Stand: Mod assist         General transfer comment: cues for LE management and use of UEs to self assist  Ambulation/Gait Ambulation/Gait assistance: Min assist;Mod assist Ambulation Distance (Feet): 2 Feet Assistive device: Rolling walker (2 wheeled) Gait Pattern/deviations: Step-to pattern;Decreased step length - right;Decreased step length - left;Shuffle;Trunk flexed     General Gait Details: cues for posture, sequence and position from RW; ltd by rapidly increasing dizziness and pt returned to bed   Stairs            Wheelchair Mobility    Modified Rankin (Stroke Patients Only)       Balance                                    Cognition Arousal/Alertness:  Awake/alert Behavior During Therapy: WFL for tasks assessed/performed Overall Cognitive Status: Within Functional Limits for tasks assessed                      Exercises      General Comments        Pertinent Vitals/Pain Pain Assessment: 0-10 Pain Score: 7  Pain Location: R knee Pain Descriptors / Indicators: Aching;Sore Pain Intervention(s): Limited activity within patient's tolerance;Monitored during session;Premedicated before session;Ice applied    Home Living                      Prior Function            PT Goals (current goals can now be found in the care plan section) Acute Rehab PT Goals Patient Stated Goal: Regain IND PT Goal Formulation: With patient Time For Goal Achievement: 03/25/16 Potential to Achieve Goals: Good Progress towards PT goals: Progressing toward goals    Frequency    7X/week      PT Plan Current plan remains appropriate    Co-evaluation             End of Session Equipment Utilized During Treatment: Gait belt;Right knee immobilizer Activity Tolerance: Patient limited by fatigue;Other (comment) (dizziness with BP 111/46 after return to supine) Patient left: in bed;with call bell/phone within reach;with family/visitor present  Time: [id number] PT Time Calculation (min) (ACUTE ONLY): 24 min  Charges:  $Gait Training: 8-22 mins $Therapeutic Activity: 8-22 mins                    G Codes:      Lenia Housley 2016-04-13, 12:57 PM

## 2016-03-21 NOTE — Progress Notes (Signed)
Physical Therapy Treatment Patient Details Name: [name] MRN: [id number] DOB: Dec 20, 1955 Today's Date: 03/21/2016    History of Present Illness Pt s/p R TKR and hx of L TKR (14) and breast CA with bil mastectomy    PT Comments    Pt progressing well with ambulation but continues to struggle with sit<>stand  Follow Up Recommendations  Home health PT     Equipment Recommendations  None recommended by PT    Recommendations for Other Services OT consult     Precautions / Restrictions Precautions Precautions: Knee;Fall Required Braces or Orthoses: Knee Immobilizer - Right Knee Immobilizer - Right: Discontinue once straight leg raise with < 10 degree lag Restrictions Weight Bearing Restrictions: No RLE Weight Bearing: Weight bearing as tolerated Other Position/Activity Restrictions: WBAT    Mobility  Bed Mobility Overal bed mobility: Needs Assistance Bed Mobility: Sit to Supine     Supine to sit: Min assist Sit to supine: Min assist   General bed mobility comments: cues for sequence and use of L LE to self assist  Transfers Overall transfer level: Needs assistance Equipment used: Rolling walker (2 wheeled) Transfers: Sit to/from Stand Sit to Stand: Mod assist Stand pivot transfers: Min assist;Mod assist       General transfer comment: cues for LE management and use of UEs to self assist  Ambulation/Gait Ambulation/Gait assistance: Min assist Ambulation Distance (Feet): 80 Feet (and 15' into bathroom) Assistive device: Rolling walker (2 wheeled) Gait Pattern/deviations: Step-to pattern;Decreased step length - right;Decreased step length - left;Shuffle;Trunk flexed Gait velocity: decr Gait velocity interpretation: Below normal speed for age/gender General Gait Details: cues for posture, sequence and position from RW;    Stairs            Wheelchair Mobility    Modified Rankin (Stroke Patients Only)       Balance                                    Cognition Arousal/Alertness: Awake/alert Behavior During Therapy: WFL for tasks assessed/performed Overall Cognitive Status: Within Functional Limits for tasks assessed                      Exercises Total Joint Exercises Ankle Circles/Pumps: AROM;Both;10 reps;Supine Quad Sets: AROM;Both;10 reps;Supine Heel Slides: AAROM;Right;15 reps;Supine Straight Leg Raises: AAROM;Right;10 reps;Supine Goniometric ROM: AAROM R knee -10 - 40    General Comments        Pertinent Vitals/Pain Pain Assessment: 0-10 Pain Score: 5  Pain Location: R knee Pain Descriptors / Indicators: Aching;Sore Pain Intervention(s): Limited activity within patient's tolerance;Monitored during session;Premedicated before session;Ice applied    Home Living                      Prior Function            PT Goals (current goals can now be found in the care plan section) Acute Rehab PT Goals Patient Stated Goal: Regain IND PT Goal Formulation: With patient Time For Goal Achievement: 03/25/16 Potential to Achieve Goals: Good Progress towards PT goals: Progressing toward goals    Frequency    7X/week      PT Plan Current plan remains appropriate    Co-evaluation             End of Session Equipment Utilized During Treatment: Gait belt;Right knee immobilizer Activity Tolerance: Patient tolerated treatment well Patient  left: in bed;with call bell/phone within reach;with family/visitor present     Time: [id number] PT Time Calculation (min) (ACUTE ONLY): 40 min  Charges:  $Gait Training: 8-22 mins $Therapeutic Exercise: 8-22 mins $Therapeutic Activity: 8-22 mins                    G Codes:      [name] 2016/04/13, 3:52 PM

## 2016-03-22 LAB — CBC
HEMATOCRIT: 22.8 % — AB (ref 36.0–46.0)
Hemoglobin: 7.7 g/dL — ABNORMAL LOW (ref 12.0–15.0)
MCH: 30.3 pg (ref 26.0–34.0)
MCHC: 33.8 g/dL (ref 30.0–36.0)
MCV: 89.8 fL (ref 78.0–100.0)
Platelets: 119 10*3/uL — ABNORMAL LOW (ref 150–400)
RBC: 2.54 MIL/uL — AB (ref 3.87–5.11)
RDW: 16.1 % — ABNORMAL HIGH (ref 11.5–15.5)
WBC: 10 10*3/uL (ref 4.0–10.5)

## 2016-03-22 LAB — PREPARE RBC (CROSSMATCH)

## 2016-03-22 MED ORDER — APIXABAN 2.5 MG PO TABS
2.5000 mg | ORAL_TABLET | Freq: Two times a day (BID) | ORAL | Status: DC
  Administered 2016-03-22 (×2): 2.5 mg via ORAL
  Filled 2016-03-22 (×3): qty 1

## 2016-03-22 MED ORDER — SODIUM CHLORIDE 0.9 % IV SOLN
Freq: Once | INTRAVENOUS | Status: AC
  Administered 2016-03-22: 09:00:00 via INTRAVENOUS

## 2016-03-22 NOTE — Progress Notes (Signed)
OT Cancellation Note  Patient Details Name: [name] MRN: [id number] DOB: 1955-10-13   Cancelled Treatment:    Reason Eval/Treat Not Completed: Medical issues which prohibited therapy  Per  Dr Tonita Cong pt is to rest today- will check on pt next day.  Kari Baars, [place]  Payton Mccallum D 03/22/2016, 9:59 AM

## 2016-03-22 NOTE — Progress Notes (Signed)
Physical Therapy Treatment Patient Details Name: [name] MRN: [id number] DOB: 1955/12/20 Today's Date: 03/22/2016    History of Present Illness Pt s/p R TKR and hx of L TKR (14) and breast CA with bil mastectomy    PT Comments    Pt progressing well with mobility.  PM session will be deferred at request of Dr Tonita Cong, pt to receive blood transfusion.  Follow Up Recommendations  Home health PT     Equipment Recommendations  None recommended by PT    Recommendations for Other Services OT consult     Precautions / Restrictions Precautions Precautions: Knee;Fall Required Braces or Orthoses: Knee Immobilizer - Right Knee Immobilizer - Right: Discontinue once straight leg raise with < 10 degree lag Restrictions Weight Bearing Restrictions: No RLE Weight Bearing: Weight bearing as tolerated    Mobility  Bed Mobility Overal bed mobility: Needs Assistance Bed Mobility: Supine to Sit;Sit to Supine     Supine to sit: Min guard Sit to supine: Min assist   General bed mobility comments: Increased time and cues for sequence and use of L LE to self assist  Transfers Overall transfer level: Needs assistance Equipment used: Rolling walker (2 wheeled) Transfers: Sit to/from Stand Sit to Stand: Min assist;Mod assist         General transfer comment: cues for LE management and use of UEs to self assist  Ambulation/Gait Ambulation/Gait assistance: Min assist Ambulation Distance (Feet): 100 Feet (and 15' back from bathroom) Assistive device: Rolling walker (2 wheeled) Gait Pattern/deviations: Step-to pattern;Decreased step length - right;Decreased step length - left;Shuffle;Trunk flexed;Antalgic Gait velocity: decr Gait velocity interpretation: Below normal speed for age/gender General Gait Details: cues for posture, sequence and position from RW;    Stairs            Wheelchair Mobility    Modified Rankin (Stroke Patients Only)       Balance                                     Cognition Arousal/Alertness: Awake/alert Behavior During Therapy: WFL for tasks assessed/performed Overall Cognitive Status: Within Functional Limits for tasks assessed                      Exercises Total Joint Exercises Ankle Circles/Pumps: AROM;Both;Supine;15 reps Quad Sets: AROM;Both;Supine;15 reps Heel Slides: AAROM;Right;15 reps;Supine Straight Leg Raises: AAROM;Right;Supine;20 reps Goniometric ROM: AAROM R knee -10 - 45    General Comments        Pertinent Vitals/Pain Pain Assessment: 0-10 Pain Score: 4  Pain Location: R knee Pain Descriptors / Indicators: Aching;Sore Pain Intervention(s): Limited activity within patient's tolerance;Monitored during session;Premedicated before session;Ice applied    Home Living                      Prior Function            PT Goals (current goals can now be found in the care plan section) Acute Rehab PT Goals Patient Stated Goal: Regain IND PT Goal Formulation: With patient Time For Goal Achievement: 03/25/16 Potential to Achieve Goals: Good Progress towards PT goals: Progressing toward goals    Frequency    7X/week      PT Plan Current plan remains appropriate    Co-evaluation             End of Session Equipment Utilized During Treatment: Gait  belt;Right knee immobilizer Activity Tolerance: Patient tolerated treatment well Patient left: in bed;with call bell/phone within reach;with family/visitor present     Time: 0800-0850 PT Time Calculation (min) (ACUTE ONLY): 50 min  Charges:  $Gait Training: 8-22 mins $Therapeutic Exercise: 8-22 mins $Therapeutic Activity: 8-22 mins                    G Codes:      [name] 2016-03-29, 12:56 PM

## 2016-03-22 NOTE — Progress Notes (Signed)
Notified PA Mechele Claude about pt's temp of 101.7. Tylenol given. Incentive spirometry encouraged. No new orders given. Will continue to monitor.

## 2016-03-22 NOTE — Progress Notes (Addendum)
Subjective: 2 Days Post-Op Procedure(s) (LRB): RIGHT TOTAL KNEE ARTHROPLASTY (Right) Patient reports pain as 6 on 0-10 scale.    Objective: Vital signs in last 24 hours: Temp:  [98.4 F (36.9 C)-101.7 F (38.7 C)] 98.7 F (37.1 C) (10/28 0600) Pulse Rate:  [98-130] 98 (10/28 0600) Resp:  [16-18] 18 (10/28 0600) BP: (104-152)/(48-59) 152/59 (10/28 0600) SpO2:  [95 %-100 %] 98 % (10/28 0600)  Intake/Output from previous day: 10/27 0701 - 10/28 0700 In: 1300 [P.O.:900; I.V.:400] Out: 1975 [id number] Intake/Output this shift: No intake/output data recorded.   Recent Labs  03/21/16 0443  HGB 9.3*    Recent Labs  03/21/16 0443  WBC 10.0  RBC 3.05*  HCT 28.2*  PLT 122*    Recent Labs  03/21/16 0443  NA 131*  K 4.2  CL 95*  CO2 27  BUN 10  CREATININE 0.56  GLUCOSE 158*  CALCIUM 8.4*   No results for input(s): LABPT, INR in the last 72 hours.  Neurologically intact Neurovascular intact Dorsiflexion/Plantar flexion intact Incision: dressing C/D/I Dressing changed. Moderate swelling and ecchymosis. Hgb pending. Called lab. They are looking for.  Assessment/Plan: 2 Days Post-Op Procedure(s) (LRB): RIGHT TOTAL KNEE ARTHROPLASTY (Right) Advance diet Up with therapy D/C IV fluids  Hold CPM today Elevate leg. Minimize time OOB to elevate. Find H/H drawn today. D/C tomorrow. Discussed with patient.  Jaanvi Fizer C 03/22/2016, 8:44 AM  Acute blood loss anemia following surgery (s/p TKA) Hgb 7.7 will transfuse one unit hold cpm. Modify Eliquis

## 2016-03-22 NOTE — Progress Notes (Addendum)
Patients pulse ranging from 80s to 130s. Patient explains cardiologist did EKG prior to patient coming into hospital and cardiologist is aware of pulse getting high. Per patient cardiologist explains this is due to patient receiving chemo. Nurse paged ortho provider to make them aware of heart rate.   Kennyth Lose returned call to nurse. If patient becomes symptomatic with chest pain an EKG will be ordered. Patient is currently asymptomatic. EKG will not be ordered at this time.

## 2016-03-23 LAB — TYPE AND SCREEN
ABO/RH(D): A POS
Antibody Screen: NEGATIVE
Unit division: 0

## 2016-03-23 LAB — CBC
HCT: 24.2 % — ABNORMAL LOW (ref 36.0–46.0)
HEMOGLOBIN: 8 g/dL — AB (ref 12.0–15.0)
MCH: 30.1 pg (ref 26.0–34.0)
MCHC: 33.1 g/dL (ref 30.0–36.0)
MCV: 91 fL (ref 78.0–100.0)
Platelets: 128 10*3/uL — ABNORMAL LOW (ref 150–400)
RBC: 2.66 MIL/uL — ABNORMAL LOW (ref 3.87–5.11)
RDW: 16.7 % — AB (ref 11.5–15.5)
WBC: 13.8 10*3/uL — AB (ref 4.0–10.5)

## 2016-03-23 NOTE — Progress Notes (Signed)
Physical Therapy Treatment Patient Details Name: [name] MRN: [id number] DOB: 07/28/1955 Today's Date: 03/23/2016    History of Present Illness 60 yo female s/p R TKA with PMH: L TKR (14) and Breast CA with bil Mastectomy    PT Comments    The patient did  Negotiate 4 steps up with 2 assist. Spouse present for instruction and assist. Plans DC today.  Follow Up Recommendations  Home health PT     Equipment Recommendations    none   Recommendations for Other Services       Precautions / Restrictions Precautions Precautions: Knee;Fall Required Braces or Orthoses: Knee Immobilizer - Right Knee Immobilizer - Right: Discontinue once straight leg raise with < 10 degree lag    Mobility  Bed Mobility   Bed Mobility: Supine to Sit     Supine to sit: Min guard     General bed mobility comments: incr time and effort but able to complete task, used leg lifter  Transfers Overall transfer level: Needs assistance Equipment used: Rolling walker (2 wheeled) Transfers: Sit to/from Stand Sit to Stand: Mod assist         General transfer comment: pt requires (A) to power up from chair. pt unable to complete without (A) after 4 attempts.   Ambulation/Gait Ambulation/Gait assistance: Min guard Ambulation Distance (Feet): 70 Feet Assistive device: Rolling walker (2 wheeled)   Gait velocity: decr   General Gait Details: cues for posture, sequence and position from RW;    Stairs Stairs: Yes Stairs assistance: +2 safety/equipment Stair Management: One rail Left;Step to pattern;With cane Number of Stairs: 4 General stair comments: attempted backward, patient unable. Did suceed in forwards with rail and cane, bvery slowly with rest breaks. Did not dex=scend, brought Recliner to patient.  Wheelchair Mobility    Modified Rankin (Stroke Patients Only)       Balance                                    Cognition Arousal/Alertness: Awake/alert                           Exercises Total Joint Exercises Ankle Circles/Pumps: AROM;Both;Supine;10 reps Quad Sets: AROM;Both;Supine;10 reps Towel Squeeze: AROM;Both;10 reps Heel Slides: AAROM;Right;10 reps Hip ABduction/ADduction: AROM;Right;10 reps Straight Leg Raises: AAROM;Right;Supine;10 reps    General Comments        Pertinent Vitals/Pain Faces Pain Scale: Hurts a little bit Pain Location: right knee Pain Descriptors / Indicators: Aching;Discomfort Pain Intervention(s): Premedicated before session;Limited activity within patient's tolerance    Home Living                      Prior Function            PT Goals (current goals can now be found in the care plan section) Progress towards PT goals: Progressing toward goals    Frequency    7X/week      PT Plan Current plan remains appropriate    Co-evaluation             End of Session Equipment Utilized During Treatment: Gait belt   Patient left: in chair;with call bell/phone within reach;with family/visitor present     Time: [id number] PT Time Calculation (min) (ACUTE ONLY): 42 min  Charges:  $Gait Training: 23-37 mins $Therapeutic Exercise: 8-22 mins  G Codes:      Claretha Cooper 03/23/2016, 1:31 PM

## 2016-03-23 NOTE — Care Management Note (Signed)
Case Management Note  Patient Details  Name: [name] MRN: [id number] Date of Birth: 1955-08-29  Subjective/Objective:    Right TKA                Action/Plan: Discharge Planning: AVS reviewed:  Chart reviewed. Please see previous NCM notes. Contacted [facility] to make aware of dc home today.   PCP Midge Minium MD  Expected Discharge Date:  10/29/217              Expected Discharge Plan:  [place]  In-House Referral:  NA  Discharge planning Services  CM Consult  Post Acute Care Choice:  Home Health Choice offered to:  Patient  DME Arranged:  N/A (Has 3n1 and RW from previous surgery) DME Agency:  NA  HH Arranged:  PT HH Agency:  [place]  Status of Service:  Completed, signed off  If discussed at [place] of Stay Meetings, dates discussed:    Additional Comments:  Erenest Rasher, RN 03/23/2016, 11:17 AM

## 2016-03-23 NOTE — Progress Notes (Signed)
     Subjective: 3 Days Post-Op Procedure(s) (LRB): RIGHT TOTAL KNEE ARTHROPLASTY (Right)   Patient reports pain as mild, pain controlled with the medication.  No events other then tachycardia, but she states that this is normal for her due to chemotherapy.  She and her husband had a discussion and she feels that she is ready to be discharged home.   Objective:   VITALS:   Vitals:   03/22/16 2158 03/23/16 0427  BP: (!) 145/58 (!) 144/56  Pulse: (!) 133 (!) 128  Resp: 16 18  Temp: 100.2 F (37.9 C) 99.6 F (37.6 C)    Dorsiflexion/Plantar flexion intact Incision: dressing C/D/I No cellulitis present Compartment soft  LABS  Recent Labs  03/21/16 0443 03/22/16 0434 03/23/16 0413  HGB 9.3* 7.7* 8.0*  HCT 28.2* 22.8* 24.2*  WBC 10.0 10.0 13.8*  PLT 122* 119* 128*     Recent Labs  03/21/16 0443  NA 131*  K 4.2  BUN 10  CREATININE 0.56  GLUCOSE 158*     Assessment/Plan: 3 Days Post-Op Procedure(s) (LRB): RIGHT TOTAL KNEE ARTHROPLASTY (Right)  Dressing changed, discussed the dressing changes at home.  Up with therapy Discharge home with home health  Follow up in 2 weeks at [facility]. Follow up with Dr. Tonita Cong in 2 weeks.  Contact information:  [facility] [address] [place] Kaydyn Sayas   PAC  03/23/2016, 8:04 AM

## 2016-03-23 NOTE — Evaluation (Signed)
Occupational Therapy Evaluation Patient Details Name: [name] MRN: [id number] DOB: Sep 01, 1955 Today's Date: 03/23/2016    History of Present Illness 60 yo female s/p R TKA with PMH: L TKR (14) and Breast CA with bil Mastectomy   Clinical Impression   Patient is s/p R TKA surgery resulting in functional limitations due to the deficits listed below (see OT problem list). Pt and spouse educated on elevation of surfaces to make transfers easier.  Patient will benefit from skilled OT acutely to increase independence and safety with ADLS to allow discharge [place]. Defer all further OT needs to home level care.     Follow Up Recommendations  Home health OT    Equipment Recommendations  None recommended by OT    Recommendations for Other Services       Precautions / Restrictions Precautions Precautions: Knee;Fall Required Braces or Orthoses: Knee Immobilizer - Right Knee Immobilizer - Right: Discontinue once straight leg raise with < 10 degree lag Restrictions Weight Bearing Restrictions: No RLE Weight Bearing: Weight bearing as tolerated      Mobility Bed Mobility Overal bed mobility: Needs Assistance Bed Mobility: Sit to Supine       Sit to supine: Min guard   General bed mobility comments: incr time and effort but able to complete task  Transfers Overall transfer level: Needs assistance Equipment used: Rolling walker (2 wheeled) Transfers: Sit to/from Stand Sit to Stand: Mod assist         General transfer comment: pt requires (A) to power up from chair. pt unable to complete without (A) after 4 attempts.     Balance                                            ADL Overall ADL's : Needs assistance/impaired Eating/Feeding: Set up   Grooming: Wash/dry hands;Wash/dry face;Oral care;Set up;Sitting Grooming Details (indicate cue type and reason): requires seated positioning due to heavy reliance on BIL Ue      Lower Body Bathing:  Moderate assistance           Toilet Transfer: Moderate assistance Toilet Transfer Details (indicate cue type and reason): Pt requires (A) for descend to 3n1 and elevated from 3n1. pt heavily relies on bil arm rest. Pt needed multiple attempts to anteriorly shift weight to power up Toileting- Clothing Manipulation and Hygiene: Min guard;Sitting/lateral lean       Functional mobility during ADLs: Min guard General ADL Comments: pt educated on use of leg lift with bed mobility but also use of sheet at night for repositioning of R LE. pt benefits from [place] on to transfer in and out of bed.     Vision Vision Assessment?: No apparent visual deficits   Perception     Praxis      Pertinent Vitals/Pain Pain Assessment: Faces Pain Score: 2  Faces Pain Scale: Hurts a little bit Pain Location: knee Pain Descriptors / Indicators: Operative site guarding Pain Intervention(s): Monitored during session;Premedicated before session;Repositioned     Hand Dominance Right   Extremity/Trunk Assessment Upper Extremity Assessment Upper Extremity Assessment: Generalized weakness   Lower Extremity Assessment Lower Extremity Assessment: Defer to PT evaluation   Cervical / Trunk Assessment Cervical / Trunk Assessment: Normal   Communication Communication Communication: No difficulties   Cognition Arousal/Alertness: Awake/alert Behavior During Therapy: WFL for tasks assessed/performed Overall Cognitive Status: Within Functional Limits  for tasks assessed                     General Comments       Exercises Exercises: Other exercises Other Exercises Other Exercises: pt with edema throughout R LE with noted blisters on medial and lateral aspects of the knee. Pt educated on elevation and ice to help with edema toward the foot   Shoulder Instructions      Home Living Family/patient expects to be discharged to:: Private residence Living Arrangements: Spouse/significant  other Available Help at Discharge: Family Type of Home: House Home Access: Stairs to enter Technical brewer of Steps: 4 Entrance Stairs-Rails: Right Home Layout: Two level;Able to live on main level with bedroom/bathroom Alternate Level Stairs-Number of Steps: 14 Alternate Level Stairs-Rails: Right Bathroom Shower/Tub: Walk-in shower         Home Equipment: Environmental consultant - 2 wheels;Cane - single point;Bedside commode (wedge for bed, elevated seat cushion)   Additional Comments: 1/2 bath and single bed on main level      Prior Functioning/Environment Level of Independence: Independent        Comments: Works full time as Therapist, sports in cath lab        OT Problem List: Decreased strength;Decreased activity tolerance;Impaired balance (sitting and/or standing);Decreased safety awareness;Decreased knowledge of use of DME or AE;Decreased knowledge of precautions;Pain;Increased edema;Decreased range of motion   OT Treatment/Interventions:      OT Goals(Current goals can be found in the care plan section) Acute Rehab OT Goals Patient Stated Goal: to return to walking OT Goal Formulation: With patient/family  OT Frequency:     Barriers to D/C:            Co-evaluation              End of Session Equipment Utilized During Treatment: Gait belt;Rolling walker;Right knee immobilizer CPM Right Knee CPM Right Knee: Off Nurse Communication: Mobility status;Weight bearing status;Precautions  Activity Tolerance: Patient tolerated treatment well Patient left: in bed;with call bell/phone within reach;with family/visitor present (PT Santiago Glad arriving)   Time: [id number] OT Time Calculation (min): 32 min Charges:  OT General Charges $OT Visit: 1 Procedure OT Evaluation $OT Eval Moderate Complexity: 1 Procedure OT Treatments $Self Care/Home Management : 8-22 mins G-Codes:    Parke Poisson B April 01, 2016, 8:52 AM   Jeri Modena   OTR/L Pager: [phone] Office: [phone] .

## 2016-03-24 ENCOUNTER — Other Ambulatory Visit: Payer: Self-pay | Admitting: *Deleted

## 2016-03-24 ENCOUNTER — Encounter: Payer: Self-pay | Admitting: *Deleted

## 2016-03-24 DIAGNOSIS — Z7901 Long term (current) use of anticoagulants: Secondary | ICD-10-CM | POA: Diagnosis not present

## 2016-03-24 DIAGNOSIS — Z471 Aftercare following joint replacement surgery: Secondary | ICD-10-CM | POA: Diagnosis not present

## 2016-03-24 DIAGNOSIS — Z96651 Presence of right artificial knee joint: Secondary | ICD-10-CM | POA: Diagnosis not present

## 2016-03-24 NOTE — Patient Outreach (Signed)
[facility]) Care Management  03/24/2016  [name] 1955/06/01 [id number]   Subjective: Telephone call to patient's home number, spoke with patient, and HIPAA verified.   Discussed [facility] Care Management [facility] Transition of care follow up and patient in agreement to complete. Patient states she is doing well and waiting for home health physical therapy to arrive.   She is a Marine scientist in the cath lab.   Has family medical leave act  (FMLA) paperwork in place and has been approved for 6 days.  States she has injury earlier this year and has used a Programmer, applications.   States she also has enough PAL time to cover leave.   RNCM educated patient on supplemental insurance, she voices understanding, and states she does not have it in place for 2017 but will have it for 2018.  She utilizes the [facility] pharmacy for medications.   Patient states she does not have any transition of care, care coordination, disease management, disease monitoring, transportation, community resource, or pharmacy needs at this time.  States she is very appreciative for the follow up call and is in agreement to receive [place] Management information.   Objective: Per chart review: Patient hospitalized  03/20/16 -03/23/16 for End-stage osteoarthrosis, valgus deformity of the right knee.   Status post Right total knee arthroplasty on 03/20/16.  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    Assessment: Received [facility] Transition of care referral on 03/21/16.  Transition of care follow up completed, no care management needs, and will proceed with case closure.  Plan: RNCM will send patient successful outreach letter, [facility] pamphlet, and magnet. RNCM will send case closure due to follow up completed / no care management needs request to [facility] [place] Management.  [name], BSN, [place] Management [facility] Telephonic  CM Phone: [phone] Fax: [phone]

## 2016-03-25 DIAGNOSIS — Z7901 Long term (current) use of anticoagulants: Secondary | ICD-10-CM | POA: Diagnosis not present

## 2016-03-25 DIAGNOSIS — Z96651 Presence of right artificial knee joint: Secondary | ICD-10-CM | POA: Diagnosis not present

## 2016-03-25 DIAGNOSIS — Z471 Aftercare following joint replacement surgery: Secondary | ICD-10-CM | POA: Diagnosis not present

## 2016-03-25 NOTE — Discharge Summary (Signed)
Physician Discharge Summary   Patient ID: [name] MRN: [id number] DOB/AGE: 60-Oct-1957 60 y.o.  Admit date: 03/20/2016 Discharge date: 03/23/2016  Primary Diagnosis: Right knee primary osteoarthritis  Admission Diagnoses:  Past Medical History:  Diagnosis Date  . 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 Arthritis KNEES   right knee, hx. past left knee replacemnt.  . Blood clot in vein    around Portacath right chest-tx. Eliquis about a yr., prior Lovenox.  . Chronic anticoagulation HX  PORT-A-CATH CLOT   2010 - HAS BEEN ON LOVENOX SINCE THE CLOT  . Elevated blood pressure reading without diagnosis of hypertension    PT MONITORS HER B/P AT HOME  . Heart murmur   . 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 PONV (postoperative nausea and vomiting)   . Skin cancer of anterior chest BREAST CANCER PRIMARY W/ METS TO CHEST WALL SKIN CANCER--  CHEMOTHERAPY EVERY 3 WEEKS AT [facility]  . Status post skin flap graft    right chest wall with metastatis right anterior chest -no drainage or open wound.   Discharge Diagnoses:   Principal Problem:   Primary osteoarthritis of right knee Active Problems:   Right knee DJD  Estimated body mass index is 25.53 kg/m as calculated from the following:   Height as of this encounter: _0  (1.702 m).   Weight as of this encounter: 73.9 kg (163 lb).  Procedure:  Procedure(s) (LRB): RIGHT TOTAL KNEE ARTHROPLASTY (Right)   Consults: None  HPI: see H&P Laboratory Data: Admission on 03/20/2016, Discharged on 03/23/2016  Component Date Value Ref Range Status  . WBC 03/21/2016 10.0  4.0 - 10.5 K/uL Final  . RBC 03/21/2016 3.05* 3.87 - 5.11 MIL/uL Final  . Hemoglobin 03/21/2016 9.3* 12.0 - 15.0 g/dL Final  . HCT 03/21/2016 28.2* 36.0 - 46.0 % Final  . MCV 03/21/2016 92.5  78.0 - 100.0 fL Final  . MCH 03/21/2016  30.5  26.0 - 34.0 pg Final  . MCHC 03/21/2016 33.0  30.0 - 36.0 g/dL Final  . RDW 03/21/2016 16.3* 11.5 - 15.5 % Final  . Platelets 03/21/2016 122* 150 - 400 K/uL Final  . Sodium 03/21/2016 131* 135 - 145 mmol/L Final  . Potassium 03/21/2016 4.2  3.5 - 5.1 mmol/L Final  . Chloride 03/21/2016 95* 101 - 111 mmol/L Final  . CO2 03/21/2016 27  22 - 32 mmol/L Final  . Glucose, Bld 03/21/2016 158* 65 - 99 mg/dL Final  . BUN 03/21/2016 10  6 - 20 mg/dL Final  . Creatinine, Ser 03/21/2016 0.56  0.44 - 1.00 mg/dL Final  . Calcium 03/21/2016 8.4* 8.9 - 10.3 mg/dL Final  . GFR calc non Af Amer 03/21/2016 >60  >60 mL/min Final  . GFR calc Af Amer 03/21/2016 >60  >60 mL/min Final   Comment: (NOTE) The eGFR has been calculated using the CKD EPI equation. This calculation has not been validated in all clinical situations. eGFR's persistently <60 mL/min signify possible Chronic Kidney Disease.   . Anion gap 03/21/2016 9  5 - 15 Final  . WBC 03/22/2016 10.0  4.0 - 10.5 K/uL Final  . RBC 03/22/2016 2.54* 3.87 - 5.11 MIL/uL Final  . Hemoglobin 03/22/2016 7.7* 12.0 - 15.0 g/dL Final  . HCT 03/22/2016 22.8* 36.0 - 46.0 % Final  . MCV 03/22/2016 89.8  78.0 - 100.0 fL Final  . MCH 03/22/2016 30.3  26.0 - 34.0 pg Final  . MCHC 03/22/2016 33.8  30.0 - 36.0 g/dL Final  . RDW 03/22/2016 16.1* 11.5 - 15.5 % Final  . Platelets 03/22/2016 119* 150 - 400 K/uL Final   Comment: REPEATED TO VERIFY SPECIMEN CHECKED FOR CLOTS PLATELET COUNT CONFIRMED BY SMEAR   . Order Confirmation 03/22/2016 ORDER PROCESSED BY BLOOD BANK   Final  . WBC 03/23/2016 13.8* 4.0 - 10.5 K/uL Final  . RBC 03/23/2016 2.66* 3.87 - 5.11 MIL/uL Final  . Hemoglobin 03/23/2016 8.0* 12.0 - 15.0 g/dL Final  . HCT 03/23/2016 24.2* 36.0 - 46.0 % Final  . MCV 03/23/2016 91.0  78.0 - 100.0 fL Final  . MCH 03/23/2016 30.1  26.0 - 34.0 pg Final  . MCHC 03/23/2016 33.1  30.0 - 36.0 g/dL Final  . RDW 03/23/2016 16.7* 11.5 - 15.5 % Final  .  Platelets 03/23/2016 128* 150 - 400 K/uL Final  Hospital Outpatient Visit on 03/17/2016  Component Date Value Ref Range Status  . MRSA, PCR 03/17/2016 NEGATIVE  NEGATIVE Final  . Staphylococcus aureus 03/17/2016 NEGATIVE  NEGATIVE Final   Comment:        The Xpert SA Assay (FDA approved for NASAL specimens in patients over 60 years of age), is one component of a comprehensive surveillance program.  Test performance has been validated by [facility] for patients greater than or equal to 73 year old. It is not intended to diagnose infection nor to guide or monitor treatment.   . CRP 03/17/2016 1.1* <1.0 mg/dL Final  . Sed Rate 03/17/2016 42* 0 - 22 mm/hr Final  . aPTT 03/17/2016 40* 24 - 36 seconds Final   Comment:        IF BASELINE aPTT IS ELEVATED, SUGGEST PATIENT RISK ASSESSMENT BE USED TO DETERMINE APPROPRIATE ANTICOAGULANT THERAPY.   . Prothrombin Time 03/17/2016 14.8  11.4 - 15.2 seconds Final  . INR 03/17/2016 1.15   Final  . ABO/RH(D) 03/23/2016 A POS   Final  . Antibody Screen 03/23/2016 NEG   Final  . Sample Expiration 03/23/2016 03/23/2016   Final  . Extend sample reason 03/23/2016 NO TRANSFUSIONS OR PREGNANCY IN THE PAST 3 MONTHS   Final  . Unit Number 03/23/2016 [id number]   Final  . Blood Component Type 03/23/2016 RED CELLS,LR   Final  . Unit division 03/23/2016 00   Final  . Status of Unit 03/23/2016 ISSUED,FINAL   Final  . Transfusion Status 03/23/2016 OK TO TRANSFUSE   Final  . Crossmatch Result 03/23/2016 Compatible   Final  . Color, Urine 03/17/2016 YELLOW  YELLOW Final  . APPearance 03/17/2016 CLEAR  CLEAR Final  . Specific Gravity, Urine 03/17/2016 1.011  1.005 - 1.030 Final  . pH 03/17/2016 6.0  5.0 - 8.0 Final  . Glucose, UA 03/17/2016 NEGATIVE  NEGATIVE mg/dL Final  . Hgb urine dipstick 03/17/2016 NEGATIVE  NEGATIVE Final  . Bilirubin Urine 03/17/2016 NEGATIVE  NEGATIVE Final  . Ketones, ur 03/17/2016 NEGATIVE  NEGATIVE mg/dL Final  . Protein,  ur 03/17/2016 NEGATIVE  NEGATIVE mg/dL Final  . Nitrite 03/17/2016 POSITIVE* NEGATIVE Final  . Leukocytes, UA 03/17/2016 SMALL* NEGATIVE Final  . Squamous Epithelial / LPF 03/17/2016 0-5* NONE SEEN Final  . WBC, UA 03/17/2016 0-5  0 - 5 WBC/hpf Final  . RBC / HPF 03/17/2016 0-5  0 - 5 RBC/hpf Final  . Bacteria, UA 03/17/2016 MANY* NONE SEEN Final  Abstract on  02/11/2016  Component Date Value Ref Range Status  . Cologuard 01/31/2016 Negative   Final     X-Rays:Dg Knee Right Port  Result Date: 03/20/2016 CLINICAL DATA:  Right knee replacement. EXAM: PORTABLE RIGHT KNEE - 1-2 VIEW COMPARISON:  09/08/2014. FINDINGS: Total right knee replacement. Hardware intact. Anatomic alignment. No acute bony abnormality. IMPRESSION: Total right knee replacement.  Anatomic alignment . Electronically Signed   By: Marcello Moores  Register   On: 03/20/2016 10:31    EKG: Orders placed or performed during the hospital encounter of 03/03/16  . EKG 12-Lead  . EKG 12-Lead     Hospital Course: NYLIAH NIERENBERG is a 60 y.o. who was admitted to [facility]. They were brought to the operating room on 03/20/2016 and underwent Procedure(s): RIGHT TOTAL KNEE ARTHROPLASTY.  Patient tolerated the procedure well and was later transferred to the recovery room and then to the orthopaedic floor for postoperative care.  They were given PO and IV analgesics for pain control following their surgery.  They were given 24 hours of postoperative antibiotics of  Anti-infectives    Start     Dose/Rate Route Frequency Ordered Stop   03/20/16 1700  vancomycin (VANCOCIN) IVPB 1000 mg/200 mL premix     1,000 mg 200 mL/hr over 60 Minutes Intravenous Every 12 hours 03/20/16 1113 03/20/16 1733   03/20/16 0806  polymyxin B 500,000 Units, bacitracin 50,000 Units in sodium chloride irrigation 0.9 % 500 mL irrigation  Status:  Discontinued       As needed 03/20/16 0807 03/20/16 0952   03/20/16 0600  vancomycin (VANCOCIN) IVPB 1000 mg/200 mL  premix     1,000 mg 200 mL/hr over 60 Minutes Intravenous On call to O.R. 03/20/16 1700 03/20/16 0645     and started on DVT prophylaxis in the form of TED hose and Eliquis and SCDs.   PT and OT were ordered for total joint protocol.  Discharge planning consulted to help with postop disposition and equipment needs.  Patient had a good night on the evening of surgery.  They started to get up OOB with therapy on day one. Continued to work with therapy into day two. She did require a transfusion on post-op day 2 due to post-op blood loss anemia. CPM and Eliquis held that morning. By day three, the patient had progressed with therapy and meeting their goals.  Incision was healing well.  Patient was seen in rounds and was ready to go home.   Diet: Regular diet Activity:WBAT Follow-up:in 10-14 days Disposition - Home Discharged Condition: good   Discharge Instructions    AMB Referral to [facility] Management    Complete by:  As directed    Please assign UMR member for post discharge call. Currently at [facility]. Thanks. Marthenia Rolling, MSN-Ed, [facility] [phone]   Reason for consult:  Please assign UMR member for post toc. Currently at [facility].   Expected date of contact:  1-3 days (reserved for hospital discharges)       Medication List    TAKE these medications   apixaban 5 MG Tabs tablet Commonly known as:  ELIQUIS Take 5 mg by mouth 2 (two) times daily. Will stop prior to proceure   celecoxib 200 MG capsule Commonly known as:  CELEBREX TAKE 1 CAPSULE BY MOUTH TWICE DAILY   darifenacin 15 MG 24 hr tablet Commonly known as:  ENABLEX Take 15 mg by mouth daily.   docusate sodium 100 MG capsule Commonly known  as:  COLACE Take 1 capsule (100 mg total) by mouth 2 (two) times daily as needed for mild constipation. What changed:  when to take this  reasons to take this   gabapentin 300 MG capsule Commonly known as:  NEURONTIN TAKE 1 CAPSULE  BY MOUTH 3 TIMES DAILY   KADCYLA IV Inject into the vein. 2.85m/kg/ Chemotherapy drug given every 3 [place] at [place] Next dose due 03-17-16   lidocaine 5 % Commonly known as:  LIDODERM APPLY 1 PATCH TO AFFECTED AREA NO MORE THAN 12 HOURS IN A 24 HOUR PERIOD AS NEEDED FOR PAIN   methocarbamol 500 MG tablet Commonly known as:  ROBAXIN Take 1 tablet (500 mg total) by mouth every 6 (six) hours as needed for muscle spasms.   ondansetron 8 MG tablet Commonly known as:  ZOFRAN Take 1 tablet (8 mg total) by mouth every 8 (eight) hours as needed for nausea.   oxyCODONE-acetaminophen 5-325 MG tablet Commonly known as:  PERCOCET Take 1-2 tablets by mouth every 4 (four) hours as needed for severe pain.   polyethylene glycol packet Commonly known as:  MIRALAX / GLYCOLAX Take 17 g by mouth daily. What changed:  when to take this  reasons to take this   traMADol 50 MG tablet Commonly known as:  ULTRAM TAKE 1 TABLET BY MOUTH EVERY 6 HOURS AS NEEDED FOR PAIN   Vitamin D3 2000 units Tabs Take 1 tablet by mouth 4 (four) times a week.   zolpidem 5 MG tablet Commonly known as:  AMBIEN TAKE 1 TABLET BY MOUTH AT BEDTIME AS NEEDED FOR SLEEP      Follow-up Information    BEANE,JEFFREY C, MD Follow up in 2 week(s).   Specialty:  Orthopedic Surgery Contact information: [address] [place] [phone]       [place].   Why:  HHPT has been ordered by your MD. A representative will be in touch within 24 hours of discharge to arrange initial PT visit.  Contact information: [phone]           Signed: JLacie Draft PA-C Orthopaedic Surgery 03/25/2016, 9:13 AM

## 2016-03-28 DIAGNOSIS — Z7901 Long term (current) use of anticoagulants: Secondary | ICD-10-CM | POA: Diagnosis not present

## 2016-03-28 DIAGNOSIS — Z471 Aftercare following joint replacement surgery: Secondary | ICD-10-CM | POA: Diagnosis not present

## 2016-03-28 DIAGNOSIS — Z96651 Presence of right artificial knee joint: Secondary | ICD-10-CM | POA: Diagnosis not present

## 2016-03-31 ENCOUNTER — Other Ambulatory Visit: Payer: Self-pay | Admitting: Internal Medicine

## 2016-03-31 DIAGNOSIS — Z471 Aftercare following joint replacement surgery: Secondary | ICD-10-CM | POA: Diagnosis not present

## 2016-03-31 DIAGNOSIS — Z96651 Presence of right artificial knee joint: Secondary | ICD-10-CM | POA: Diagnosis not present

## 2016-03-31 DIAGNOSIS — Z7901 Long term (current) use of anticoagulants: Secondary | ICD-10-CM | POA: Diagnosis not present

## 2016-03-31 MED FILL — ELIQUIS 5 MG TABLET: 5 | 30 days supply | Qty: 60 | Fill #10

## 2016-04-01 NOTE — Telephone Encounter (Signed)
Routing this to PCP for refill

## 2016-04-02 ENCOUNTER — Other Ambulatory Visit: Payer: Self-pay

## 2016-04-02 DIAGNOSIS — Z96651 Presence of right artificial knee joint: Secondary | ICD-10-CM | POA: Diagnosis not present

## 2016-04-02 DIAGNOSIS — Z7901 Long term (current) use of anticoagulants: Secondary | ICD-10-CM | POA: Diagnosis not present

## 2016-04-02 DIAGNOSIS — Z471 Aftercare following joint replacement surgery: Secondary | ICD-10-CM | POA: Diagnosis not present

## 2016-04-02 MED FILL — CELECOXIB 200 MG CAPSULE: 200 | 30 days supply | Qty: 60 | Fill #0

## 2016-04-03 DIAGNOSIS — Z96651 Presence of right artificial knee joint: Secondary | ICD-10-CM | POA: Diagnosis not present

## 2016-04-03 DIAGNOSIS — M25562 Pain in left knee: Secondary | ICD-10-CM | POA: Diagnosis not present

## 2016-04-04 DIAGNOSIS — Z7901 Long term (current) use of anticoagulants: Secondary | ICD-10-CM | POA: Diagnosis not present

## 2016-04-04 DIAGNOSIS — Z96651 Presence of right artificial knee joint: Secondary | ICD-10-CM | POA: Diagnosis not present

## 2016-04-04 DIAGNOSIS — Z471 Aftercare following joint replacement surgery: Secondary | ICD-10-CM | POA: Diagnosis not present

## 2016-04-04 MED FILL — DARIFENACIN ER 15 MG TABLET: 15 | 30 days supply | Qty: 30 | Fill #4

## 2016-04-07 ENCOUNTER — Ambulatory Visit: Payer: 59

## 2016-04-07 DIAGNOSIS — C50412 Malignant neoplasm of upper-outer quadrant of left female breast: Secondary | ICD-10-CM | POA: Diagnosis not present

## 2016-04-07 DIAGNOSIS — C7989 Secondary malignant neoplasm of other specified sites: Secondary | ICD-10-CM | POA: Diagnosis not present

## 2016-04-07 DIAGNOSIS — C792 Secondary malignant neoplasm of skin: Secondary | ICD-10-CM | POA: Diagnosis not present

## 2016-04-07 DIAGNOSIS — Z5112 Encounter for antineoplastic immunotherapy: Secondary | ICD-10-CM | POA: Diagnosis not present

## 2016-04-08 ENCOUNTER — Ambulatory Visit: Payer: 59 | Admitting: Physical Therapy

## 2016-04-08 ENCOUNTER — Ambulatory Visit: Payer: 59 | Attending: Specialist | Admitting: Physical Therapy

## 2016-04-08 DIAGNOSIS — M6281 Muscle weakness (generalized): Secondary | ICD-10-CM | POA: Diagnosis not present

## 2016-04-08 DIAGNOSIS — M25561 Pain in right knee: Secondary | ICD-10-CM | POA: Diagnosis not present

## 2016-04-08 DIAGNOSIS — M25661 Stiffness of right knee, not elsewhere classified: Secondary | ICD-10-CM | POA: Insufficient documentation

## 2016-04-08 DIAGNOSIS — R600 Localized edema: Secondary | ICD-10-CM | POA: Diagnosis not present

## 2016-04-08 NOTE — Therapy (Signed)
[facility] [address], [place], [place], [address] Phone: [phone]   Fax:  [phone]  Physical Therapy Evaluation  Patient Details  Name: [name] MRN: [id number] Date of Birth: 12/26/1955 Referring Provider: Dr. Tonita Cong  Encounter Date: 04/08/2016      PT End of Session - 04/08/16 1841    Visit Number 1   Date for PT Re-Evaluation 06/03/16   Authorization Type UMR    PT Start Time 1125   PT Stop Time 1230   PT Time Calculation (min) 65 min   Activity Tolerance Patient tolerated treatment well      Past Medical History:  Diagnosis Date  . 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 Arthritis KNEES   right knee, hx. past left knee replacemnt.  . Blood clot in vein    around Portacath right chest-tx. Eliquis about a yr., prior Lovenox.  . Chronic anticoagulation HX  PORT-A-CATH CLOT   2010 - HAS BEEN ON LOVENOX SINCE THE CLOT  . Elevated blood pressure reading without diagnosis of hypertension    PT MONITORS HER B/P AT HOME  . Heart murmur   . 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 PONV (postoperative nausea and vomiting)   . Skin cancer of anterior chest BREAST CANCER PRIMARY W/ METS TO CHEST WALL SKIN CANCER--  CHEMOTHERAPY EVERY 3 WEEKS AT [facility]  . Status post skin flap graft    right chest wall with metastatis right anterior chest -no drainage or open wound.    Past Surgical History:  Procedure Laterality Date  . CHEST WALL TUMOR EXCISION    . hemotoma     evacuation left chest wall  . KNEE ARTHROSCOPY  02/13/2012   Procedure: ARTHROSCOPY KNEE;  Surgeon: Johnn Hai, MD;  Location: [facility];  Service: Orthopedics;  Laterality: Left;  WITH DEBRIDEMENt   . KNEE ARTHROSCOPY WITH LATERAL MENISECTOMY  02/13/2012   Procedure: KNEE ARTHROSCOPY  WITH LATERAL MENISECTOMY;  Surgeon: Johnn Hai, MD;  Location: [facility];  Service: Orthopedics;;  partial  . LEFT MODIFIED RADICAL MASTECTOMY/ RIGHT TOTAL MASTECTOMY  11-13-2007   LEFT BREAST CANCER W/ AXILLARY LYMPH NODE METASTASIS AND POST NEOADJUVANT CHEMO  . PLACEMENT PORT-A-CATH  06/22/2007   right chest  . SKIN GRAFT     chest-post tumor removal, second occurrence of cancer" 2015 surgery for Flap 2015"  . TOTAL KNEE ARTHROPLASTY Left 09/23/2012   Procedure: LEFT TOTAL KNEE ARTHROPLASTY;  Surgeon: Johnn Hai, MD;  Location: [facility];  Service: Orthopedics;  Laterality: Left;  . TOTAL KNEE ARTHROPLASTY Right 03/20/2016   Procedure: RIGHT TOTAL KNEE ARTHROPLASTY;  Surgeon: Susa Day, MD;  Location: [facility];  Service: Orthopedics;  Laterality: Right;  . TRANSTHORACIC ECHOCARDIOGRAM  11-18-2011  DR BENSIMHON (ECHO EVERY 3 MONTHS)   HX CHEMO INDUCED CARDIOTOXICITY/  LVSF NORMAL/ EF 55-50%/ MILD MITRIAL VALVE REGURG./ MILDLY INCREASED SYSTOLIC PRESSURE OF PULMONARY ARTERIES    There were no vitals filed for this visit.       Subjective Assessment - 04/08/16 1124    Subjective Fell in Sept injuring left knee b/c of right knee problem but this resolved; right knee DJD and underwent TKR right 03/20/16 (2 1/2 weeks ago);  presents without assistive device stating the cane messes her up;  uses walker "when I get tired" ;  reports still has incision bloody drainage.  Staples are out.  Blisters from dressing;  Had HHPT until Friday for bending but stopped with bleeding at incision.     Pertinent History chemo yesterday at [facility] for chest wall CA;;  left TKR 3 1/2 years ago   Limitations Walking;House hold activities   How long can you walk comfortably? 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 Yes   Pain Score 3    Pain Location Knee   Pain Orientation Right   Pain Type Surgical pain   Pain Onset 1 to 4 weeks ago   Pain Frequency Constant   Aggravating Factors  hurts with exercise but it is getting better   Pain Relieving Factors elevate;  ice packs            [facility] PT Assessment - 04/08/16 0001      Assessment   Medical Diagnosis right TKR   Referring Provider Dr. Tonita Cong   Onset Date/Surgical Date 03/20/16   Hand Dominance Right   Next MD Visit 05/01/16   Prior Therapy HHPT finished Friday     Precautions   Precautions Other (comment)   Precaution Comments Cancer--no U/S,heat     Restrictions   Weight Bearing Restrictions No     Balance Screen   Has the patient fallen in the past 6 months Yes   How many times? 1   Has the patient had a decrease in activity level because of a fear of falling?  No   Is the patient reluctant to leave their home because of a fear of falling?  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  FMLA until 05/22/16   Leisure garden, walk; read     Observation/Other Assessments   Focus on Therapeutic Outcomes (FOTO)  47% limitation      Circumferential Edema   Circumferential - Right mid patella 50 cm;  10cm above superior pole 54cm;  10cm inferior pole 44 cm    Circumferential - Left  46 cm mid pat,45 cm,40 cm      Posture/Postural Control   Posture Comments severe anterior thigh bruising;  guaze over incision with blood stain through guaze mid incision area-- patient reports doctor is aware     AROM   Right/Left Knee  Right;Left   Right Knee Extension 18  12 supine   Right Knee Flexion 83  seated   Left Knee Extension 10   Left Knee Flexion 110     Strength   Overall Strength Other (comment)  Quad lag with SLR which increases after 3 reps;  Quad 3-/5     Ambulation/Gait   Assistive device None   Gait Pattern --  right LE externally rotated  [place] Adult PT Treatment/Exercise - 04/08/16 0001      Vasopneumatic   Number Minutes Vasopneumatic  15 minutes   Vasopnuematic Location  Knee   Vasopneumatic Pressure Medium   Vasopneumatic Temperature  3 snowflakes                PT Education - 04/08/16 1840    Education provided Yes   Education Details knee ROM ex every 2 hours;  retro steppping;  seated LAQ with ankle pumps for edema control   Person(s) Educated Patient   Methods Explanation;Demonstration;Handout   Comprehension Verbalized understanding;Returned demonstration          PT Short Term Goals - 04/08/16 1918      PT SHORT TERM GOAL #1   Title be independent in initial HEP to promote flexion and extension ROM to decrease risk of contracture  05/06/16   Time 4   Period Weeks   Status New     PT SHORT TERM GOAL #2   Title reduced right LE edema: mid patella < 2.5 cm difference with left and  mid calf < 2.5  cm difference with left needed to promote healing, improved ROM and muscle activation   Time 4   Period Weeks   Status New     PT SHORT TERM GOAL #3   Title demonstrate Right  knee AROM flexion to 90 degrees needed for greater ease with ascending stairs to bedroom   Time 4   Period Weeks   Status New           PT Long Term Goals - 04/08/16 1921      PT LONG TERM GOAL #1   Title be independent in advanced HEP for 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   06/03/16   Time 8   Period Weeks   Status New     PT LONG TERM GOAL #2   Title reduce FOTO to < or = to 37% limitation   Time 8   Period  Weeks   Status New     PT LONG TERM GOAL #3   Title reduced Right LE edema: mid patella < 2 cm difference, mid calf to < 2 cm for improved muscle activation and knee ROM   Time 8   Period Weeks   Status New     PT LONG TERM GOAL #4   Title The patient will be able to walk 600 feet for community ambulation    Time 8   Period Weeks   Status New     PT LONG TERM GOAL #5   Title report 50% reduction in right knee pain with standing and walking   Time 8   Period Weeks   Status New     Additional Long Term Goals   Additional Long Term Goals Yes     PT LONG TERM GOAL #6   Title improve right  knee AROM flexion to > or = to 100 degrees and extension to 5 degrees to improve squatting, rising from a chair and descending stairs   Time 8   Period Weeks   Status New     PT LONG TERM GOAL #7   Title Right quad and HS strength to 4-/5 needed for standing for household chores and 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  She is ambulating without an assistive device with a limp secondary to decreased knee flexion, decreased heel strike and decreased stance time on right.  She also tends to externally rotate her right LE.  She has major bruising right anterior thigh and severe swelling.  She has guaze covering her incision and continued bleeding from her incision.  She reports the doctor is aware of the bleeding.  Circumferential (girth measurements with approx 5 cm discrepancy in size between right and left.)  Seated knee active ROM:  18-83, supine 12-80 degrees.  Decreased quad muscle activation 3-/5 with quad lag.  She is only able to walk very short community and household distances.  She has great difficulty going up a flight of stairs to her bedroom so she has been sleeping temporarily downstairs.  She is unable to work  as a Marine scientist in the [employer].  Her medical history is significant for active cancer to chest wall (currently receiving chemo), neuropathy secondary to chemo, left TKR 2 years ago with a recent left knee contusion from a fall in September.  She is of moderate complexity evaluation secondary to evolving status secondary to  medications which affect bruising and edema, numerous co-morbidities including active cancer and neuropathy which will slow progress and multiple body systems are affected.      Rehab Potential Good   Clinical Impairments Affecting Rehab Potential Cancer no U/S ;  sensitive to tapes (post surgical dressing tape caused blisters)   PT Frequency 3x / week   PT Duration 8 weeks   PT Treatment/Interventions ADLs/Self Care Home Management;Cryotherapy;Electrical Stimulation;Functional mobility training;Stair training;Gait training;Moist Heat;Therapeutic activities;Therapeutic exercise;Balance training;Neuromuscular re-education;Patient/family education;Passive range of motion;Manual techniques;Vasopneumatic Device   PT Next Visit Plan right knee ROM supine, seated, standing;  quad muscle activation;  Nu-Step;  watch surgical incision for excessive bleeding;  vasocompression       Patient will benefit from skilled therapeutic intervention in order to improve the following deficits and impairments:  Difficulty walking, Decreased range of motion, Pain, Increased edema, Decreased strength, Decreased mobility, Impaired flexibility, Decreased activity tolerance, Decreased endurance, Hypomobility  Visit Diagnosis: Acute pain of right knee - Plan: PT plan of care cert/re-cert  Stiffness of right knee, not elsewhere classified - Plan: PT plan of care cert/re-cert  Muscle weakness (generalized) - Plan: PT plan of care cert/re-cert  Localized edema - Plan: PT plan of care cert/re-cert     Problem List Patient Active Problem List   Diagnosis Date Noted  . 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 Vitamin  D deficiency 01/25/2016  . 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 Fever and chills 02/01/2015  . Primary osteoarthritis of right knee 02/01/2015  . Anemia 02/01/2015  . Frequent UTI 01/12/2014  . Sinus tachycardia 11/03/2013  . DOE (dyspnea on exertion) 11/03/2013  . Postoperative anemia due to acute blood loss 12/24/2012  .    09/23/2012  . Sinusitis 07/28/2012  . 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 Breast cancer, left breast ([place]) 11/12/2010  . Chronic anticoagulation 11/12/2010  . Anxiety, generalized 03/26/2008   Ruben Im, PT 04/08/16 7:31 PM Phone: [phone] Fax: [phone]  Alvera Singh 04/08/2016, 7:29 PM  [facility] [address] [place], [place], [address] Phone: [phone]   Fax:  [phone]  Name:  [name] MRN: [id number] Date of Birth: April 09, 1956

## 2016-04-08 NOTE — Patient Instructions (Signed)
Sachi Boulay PT [facility] [address], [address] [place], [place] [address] Phone # [phone] Fax [phone]    

## 2016-04-09 ENCOUNTER — Ambulatory Visit: Payer: 59 | Admitting: Physical Therapy

## 2016-04-09 ENCOUNTER — Encounter: Payer: Self-pay | Admitting: Physical Therapy

## 2016-04-09 DIAGNOSIS — M25561 Pain in right knee: Secondary | ICD-10-CM | POA: Diagnosis not present

## 2016-04-09 DIAGNOSIS — M6281 Muscle weakness (generalized): Secondary | ICD-10-CM | POA: Diagnosis not present

## 2016-04-09 DIAGNOSIS — M25661 Stiffness of right knee, not elsewhere classified: Secondary | ICD-10-CM

## 2016-04-09 DIAGNOSIS — R600 Localized edema: Secondary | ICD-10-CM

## 2016-04-09 NOTE — Therapy (Signed)
[facility] [address] [place], [place], [address] Phone: [phone]   Fax:  [phone]  Physical Therapy Treatment  Patient Details  Name: [name] MRN: [id number] Date of Birth: 05-16-1956 Referring Provider: Dr. Tonita Cong  Encounter Date: 04/09/2016      PT End of Session - 04/09/16 1143    Visit Number 2   Date for PT Re-Evaluation 06/03/16   Authorization Type UMR    PT Start Time 1130   PT Stop Time 1220   PT Time Calculation (min) 50 min   Activity Tolerance Patient tolerated treatment well   Behavior During Therapy [facility] for tasks assessed/performed      Past Medical History:  Diagnosis Date  . 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 Arthritis KNEES   right knee, hx. past left knee replacemnt.  . Blood clot in vein    around Portacath right chest-tx. Eliquis about a yr., prior Lovenox.  . Chronic anticoagulation HX  PORT-A-CATH CLOT   2010 - HAS BEEN ON LOVENOX SINCE THE CLOT  . Elevated blood pressure reading without diagnosis of hypertension    PT MONITORS HER B/P AT HOME  . Heart murmur   . 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 PONV (postoperative nausea and vomiting)   . Skin cancer of anterior chest BREAST CANCER PRIMARY W/ METS TO CHEST WALL SKIN CANCER--  CHEMOTHERAPY EVERY 3 WEEKS AT [facility]  . Status post skin flap graft    right chest wall with metastatis right anterior chest -no drainage or open wound.    Past Surgical History:  Procedure Laterality Date  . CHEST WALL TUMOR EXCISION    . hemotoma     evacuation left chest wall  . KNEE ARTHROSCOPY  02/13/2012   Procedure: ARTHROSCOPY KNEE;  Surgeon: Johnn Hai, MD;  [facility];  Service: Orthopedics;  Laterality: Left;  WITH DEBRIDEMENt   . KNEE ARTHROSCOPY WITH  LATERAL MENISECTOMY  02/13/2012   Procedure: KNEE ARTHROSCOPY WITH LATERAL MENISECTOMY;  Surgeon: Johnn Hai, MD;  Location: [place];  Service: Orthopedics;;  partial  . LEFT MODIFIED RADICAL MASTECTOMY/ RIGHT TOTAL MASTECTOMY  11-13-2007   LEFT BREAST CANCER W/ AXILLARY LYMPH NODE METASTASIS AND POST NEOADJUVANT CHEMO  . PLACEMENT PORT-A-CATH  06/22/2007   right chest  . SKIN GRAFT     chest-post tumor removal, second occurrence of cancer" 2015 surgery for Flap 2015"  . TOTAL KNEE ARTHROPLASTY Left 09/23/2012   Procedure: LEFT TOTAL KNEE ARTHROPLASTY;  Surgeon: Johnn Hai, MD;  Location: [facility];  Service: Orthopedics;  Laterality: Left;  . TOTAL KNEE ARTHROPLASTY Right 03/20/2016   Procedure: RIGHT TOTAL KNEE ARTHROPLASTY;  Surgeon: Susa Day, MD;  Location: [facility];  Service: Orthopedics;  Laterality: Right;  . TRANSTHORACIC ECHOCARDIOGRAM  11-18-2011  DR BENSIMHON (ECHO EVERY 3 MONTHS)   HX CHEMO INDUCED CARDIOTOXICITY/  LVSF NORMAL/ EF 55-50%/ MILD MITRIAL VALVE REGURG./ MILDLY INCREASED SYSTOLIC PRESSURE OF PULMONARY ARTERIES    There were no vitals filed for this visit.      Subjective Assessment - 04/09/16 1144    Subjective Small amt of bleeding distal scar, otherwise no new complaints. Continues with significant edema.    Currently in Pain? No/denies   Multiple Pain Sites No  [place] Adult PT Treatment/Exercise - 04/09/16 0001      Knee/Hip Exercises: Aerobic   Nustep L1 x 10 min     Knee/Hip Exercises: Standing   Rebounder weight shifting 1 min 3 ways  VC to be aware of her weight bearing in LTLE     Knee/Hip Exercises: Seated   Long Arc Quad AROM;Strengthening;Right;2 sets;10 reps     Knee/Hip Exercises: Supine   Quad Sets Strengthening;Right;2 sets;10 reps  VC/TC to facilitate quad contraction   Short Arc Quad Sets AROM;Strengthening;Right;2 sets;10 reps   Knee Flexion AROM;Right;2 sets;10 reps    Other Supine Knee/Hip Exercises Ankle pumps 20x     Vasopneumatic   Number Minutes Vasopneumatic  15 minutes   Vasopnuematic Location  Knee   Vasopneumatic Pressure Medium   Vasopneumatic Temperature  3 snowflakes                PT Education - 04/08/16 1840    Education provided Yes   Education Details knee ROM ex every 2 hours;  retro steppping;  seated LAQ with ankle pumps for edema control   Person(s) Educated Patient   Methods Explanation;Demonstration;Handout   Comprehension Verbalized understanding;Returned demonstration          PT Short Term Goals - 04/08/16 1918      PT SHORT TERM GOAL #1   Title be independent in initial HEP to promote flexion and extension ROM to decrease risk of contracture  05/06/16   Time 4   Period Weeks   Status New     PT SHORT TERM GOAL #2   Title reduced right LE edema: mid patella < 2.5 cm difference with left and  mid calf < 2.5  cm difference with left needed to promote healing, improved ROM and muscle activation   Time 4   Period Weeks   Status New     PT SHORT TERM GOAL #3   Title demonstrate Right  knee AROM flexion to 90 degrees needed for greater ease with ascending stairs to bedroom   Time 4   Period Weeks   Status New           PT Long Term Goals - 04/08/16 1921      PT LONG TERM GOAL #1   Title be independent in advanced HEP for 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   06/03/16   Time 8   Period Weeks   Status New     PT LONG TERM GOAL #2   Title reduce FOTO to < or = to 37% limitation   Time 8   Period Weeks   Status New     PT LONG TERM GOAL #3   Title reduced Right LE edema: mid patella < 2 cm difference, mid calf to < 2 cm for improved muscle activation and knee ROM   Time 8   Period Weeks   Status New     PT LONG TERM GOAL #4   Title The patient will be able to walk 600 feet for community ambulation    Time 8   Period Weeks   Status New     PT LONG TERM  GOAL #5   Title report 50% reduction in right knee pain with standing and walking   Time 8   Period Weeks   Status New     Additional Long Term Goals   Additional Long Term Goals Yes     PT LONG TERM  GOAL #6   Title improve right  knee AROM flexion to > or = to 100 degrees and extension to 5 degrees to improve squatting, rising from a chair and descending stairs   Time 8   Period Weeks   Status New     PT LONG TERM GOAL #7   Title Right quad and HS strength to 4-/5 needed for standing for household chores and 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 Quad activation low with quad sets as pt tends to contract glutes, but pt could perform [place] with good quad activation. Too early for progress towards goals.    PT Next Visit Plan ROM exercises, quad activation exercises, Game Ready.   Consulted and Agree with Plan of Care Patient      Patient will benefit from skilled therapeutic intervention in order to improve the following deficits and impairments:     Visit Diagnosis: Acute pain of right knee  Stiffness of right knee, not elsewhere classified  Muscle weakness (generalized)  Localized edema     Problem List Patient Active Problem List   Diagnosis Date Noted  . 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 Vitamin D deficiency 01/25/2016  . 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 Fever and chills 02/01/2015  . Primary osteoarthritis of right knee 02/01/2015  . Anemia 02/01/2015  . Frequent UTI 01/12/2014  . Sinus tachycardia 11/03/2013  . DOE (dyspnea on exertion) 11/03/2013  . Postoperative anemia due to acute blood loss 12/24/2012  .    09/23/2012  . Sinusitis 07/28/2012  . Neuropathy due to chemotherapeutic drug ([place]) 07/25/2011  . 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 Breast  cancer, left breast ([place]) 11/12/2010  . Chronic anticoagulation 11/12/2010  . Anxiety, generalized 03/26/2008    Kedric Bumgarner, PTA 04/09/2016, 12:21 PM  [facility] [address] [place], [place], [address] Phone: [phone]   Fax:  [phone]  Name: CORALYN ROSELLI MRN: [id number] Date of Birth: 10/21/1955

## 2016-04-11 ENCOUNTER — Encounter: Payer: Self-pay | Admitting: Physical Therapy

## 2016-04-11 ENCOUNTER — Ambulatory Visit: Payer: 59 | Admitting: Physical Therapy

## 2016-04-11 DIAGNOSIS — M25661 Stiffness of right knee, not elsewhere classified: Secondary | ICD-10-CM

## 2016-04-11 DIAGNOSIS — M25561 Pain in right knee: Secondary | ICD-10-CM | POA: Diagnosis not present

## 2016-04-11 DIAGNOSIS — R600 Localized edema: Secondary | ICD-10-CM

## 2016-04-11 DIAGNOSIS — M6281 Muscle weakness (generalized): Secondary | ICD-10-CM

## 2016-04-11 NOTE — Therapy (Signed)
[facility] [address], [place], [place], [address] Phone: [phone]   Fax:  [phone]  Physical Therapy Treatment  Patient Details  Name: [name] MRN: [id number] Date of Birth: 19-Sep-1955 Referring Provider: Dr. Tonita Cong  Encounter Date: 04/11/2016      PT End of Session - 04/11/16 1019    Visit Number 3   Date for PT Re-Evaluation 06/03/16   Authorization Type UMR    PT Start Time 1015   PT Stop Time 1108   PT Time Calculation (min) 53 min   Activity Tolerance Patient tolerated treatment well   Behavior During Therapy [facility] for tasks assessed/performed      Past Medical History:  Diagnosis Date  . 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 Arthritis KNEES   right knee, hx. past left knee replacemnt.  . Blood clot in vein    around Portacath right chest-tx. Eliquis about a yr., prior Lovenox.  . Chronic anticoagulation HX  PORT-A-CATH CLOT   2010 - HAS BEEN ON LOVENOX SINCE THE CLOT  . Elevated blood pressure reading without diagnosis of hypertension    PT MONITORS HER B/P AT HOME  . Heart murmur   . 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 PONV (postoperative nausea and vomiting)   . Skin cancer of anterior chest BREAST CANCER PRIMARY W/ METS TO CHEST WALL SKIN CANCER--  CHEMOTHERAPY EVERY 3 WEEKS AT [facility]  . Status post skin flap graft    right chest wall with metastatis right anterior chest -no drainage or open wound.    Past Surgical History:  Procedure Laterality Date  . CHEST WALL TUMOR EXCISION    . hemotoma     evacuation left chest wall  . KNEE ARTHROSCOPY  02/13/2012   Procedure: ARTHROSCOPY KNEE;  Surgeon: Johnn Hai, MD;  Location[facility];  Service: Orthopedics;  Laterality: Left;  WITH DEBRIDEMENt   . KNEE ARTHROSCOPY WITH  LATERAL MENISECTOMY  02/13/2012   Procedure: KNEE ARTHROSCOPY WITH LATERAL MENISECTOMY;  Surgeon: Johnn Hai, MD;  Location: [place];  Service: Orthopedics;;  partial  . LEFT MODIFIED RADICAL MASTECTOMY/ RIGHT TOTAL MASTECTOMY  11-13-2007   LEFT BREAST CANCER W/ AXILLARY LYMPH NODE METASTASIS AND POST NEOADJUVANT CHEMO  . PLACEMENT PORT-A-CATH  06/22/2007   right chest  . SKIN GRAFT     chest-post tumor removal, second occurrence of cancer" 2015 surgery for Flap 2015"  . TOTAL KNEE ARTHROPLASTY Left 09/23/2012   Procedure: LEFT TOTAL KNEE ARTHROPLASTY;  Surgeon: Johnn Hai, MD;  Location[facility];  Service: Orthopedics;  Laterality: Left;  . TOTAL KNEE ARTHROPLASTY Right 03/20/2016   Procedure: RIGHT TOTAL KNEE ARTHROPLASTY;  Surgeon: Susa Day, MD;  Location: [facility];  Service: Orthopedics;  Laterality: Right;  . TRANSTHORACIC ECHOCARDIOGRAM  11-18-2011  DR BENSIMHON (ECHO EVERY 3 MONTHS)   HX CHEMO INDUCED CARDIOTOXICITY/  LVSF NORMAL/ EF 55-50%/ MILD MITRIAL VALVE REGURG./ MILDLY INCREASED SYSTOLIC PRESSURE OF PULMONARY ARTERIES    There were no vitals filed for this visit.      Subjective Assessment - 04/11/16 1017    Subjective Pt is on antibiotic for redness at scar but reports its already looking better. States only having a tiny bit of pain today.    Pertinent History chemo yesterday  at [facility] for chest wall CA;;  left TKR 3 1/2 years ago   Limitations Walking;House hold activities   How long can you walk comfortably? 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 Yes   Pain Score 2    Pain Location Knee   Pain Orientation Right   Pain Descriptors / Indicators Burning;Constant   Pain Type Surgical pain   Pain Onset 1 to 4 weeks ago   Pain Frequency Constant             [facility] PT Assessment - 04/11/16 0001      AROM   Right Knee Extension 24  supine   Right Knee Flexion 91  supine                     [facility] Adult PT Treatment/Exercise - 04/11/16 0001      Knee/Hip Exercises: Aerobic   Nustep L1 x 10 min     Knee/Hip Exercises: Standing   Rebounder weight shifting 1 min 3 ways  VC to be aware of her weight bearing in LTLE     Knee/Hip Exercises: Seated   Long Arc Quad AROM;Strengthening;Right;2 sets;10 reps     Knee/Hip Exercises: Supine   Quad Sets Strengthening;Right;2 sets;10 reps  VC/TC to facilitate quad contraction   Short Arc Quad Sets AROM;Strengthening;Right;2 sets;10 reps   Knee Flexion AROM;Right;2 sets;10 reps   Other Supine Knee/Hip Exercises Ankle pumps 20x     Vasopneumatic   Number Minutes Vasopneumatic  15 minutes   Vasopnuematic Location  Knee   Vasopneumatic Pressure Medium   Vasopneumatic Temperature  3 snowflakes                  PT Short Term Goals - 04/08/16 1918      PT SHORT TERM GOAL #1   Title be independent in initial HEP to promote flexion and extension ROM to decrease risk of contracture  05/06/16   Time 4   Period Weeks   Status New     PT SHORT TERM GOAL #2   Title reduced right LE edema: mid patella < 2.5 cm difference with left and  mid calf < 2.5  cm difference with left needed to promote healing, improved ROM and muscle activation   Time 4   Period Weeks   Status New     PT SHORT TERM GOAL #3   Title demonstrate Right  knee AROM flexion to 90 degrees needed for greater ease with ascending stairs to bedroom   Time 4   Period Weeks   Status New           PT Long Term Goals - 04/08/16 1921      PT LONG TERM GOAL #1   Title be independent in advanced HEP for 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   06/03/16   Time 8   Period Weeks   Status New     PT LONG TERM GOAL #2   Title reduce FOTO to < or = to 37% limitation   Time 8    Period Weeks   Status New     PT LONG TERM GOAL #3   Title reduced Right LE edema: mid patella < 2 cm  difference, mid calf to < 2 cm for improved muscle activation and knee ROM   Time 8   Period Weeks   Status New     PT LONG TERM GOAL #4   Title The patient will be able to walk 600 feet for community ambulation    Time 8   Period Weeks   Status New     PT LONG TERM GOAL #5   Title report 50% reduction in right knee pain with standing and walking   Time 8   Period Weeks   Status New     Additional Long Term Goals   Additional Long Term Goals Yes     PT LONG TERM GOAL #6   Title improve right  knee AROM flexion to > or = to 100 degrees and extension to 5 degrees to improve squatting, rising from a chair and descending stairs   Time 8   Period Weeks   Status New     PT LONG TERM GOAL #7   Title Right quad and HS strength to 4-/5 needed for standing for household chores and 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 Pt continues to have significant edema but reports minimal pain. Pt will conitnue to benefit from skilled thearpy for LE ROM, strength and edem management.    Rehab Potential Good   Clinical Impairments Affecting Rehab Potential Cancer no U/S ;  sensitive to tapes (post surgical dressing tape caused blisters)   PT Frequency 3x / week   PT Duration 8 weeks   PT Treatment/Interventions ADLs/Self Care Home Management;Cryotherapy;Electrical Stimulation;Functional mobility training;Stair training;Gait training;Moist Heat;Therapeutic activities;Therapeutic exercise;Balance training;Neuromuscular re-education;Patient/family education;Passive range of motion;Manual techniques;Vasopneumatic Device   PT Next Visit Plan ROM exercises, quad activation  exercises, Game Ready.   Consulted and Agree with Plan of Care Patient      Patient will benefit from skilled therapeutic intervention in order to improve the following deficits and impairments:  Difficulty walking, Decreased range of motion, Pain, Increased edema, Decreased strength, Decreased mobility, Impaired flexibility, Decreased activity tolerance, Decreased endurance, Hypomobility  Visit Diagnosis: Acute pain of right knee  Stiffness of right knee, not elsewhere classified  Muscle weakness (generalized)  Localized edema     Problem List Patient Active Problem List   Diagnosis Date Noted  . 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 Vitamin D deficiency 01/25/2016  . 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 Fever and chills 02/01/2015  . Primary osteoarthritis of right knee 02/01/2015  . Anemia 02/01/2015  . Frequent UTI 01/12/2014  . Sinus tachycardia 11/03/2013  . DOE (dyspnea on exertion) 11/03/2013  . Postoperative anemia due to acute blood loss 12/24/2012  .    09/23/2012  . Sinusitis 07/28/2012  . Neuropathy due to chemotherapeutic drug ([place]) 07/25/2011  . 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 Breast cancer, left breast ([place]) 11/12/2010  . Chronic anticoagulation 11/12/2010  . Anxiety, generalized 03/26/2008    Mikle Bosworth PTA 04/11/2016, 10:53 AM  [facility] [address] [place], [place], [address] Phone: [phone]   Fax:  [phone]  Name: [name] MRN: [id number] Date of Birth: [birth year]

## 2016-04-12 ENCOUNTER — Encounter (HOSPITAL_COMMUNITY): Payer: Self-pay | Admitting: Emergency Medicine

## 2016-04-12 ENCOUNTER — Emergency Department (HOSPITAL_COMMUNITY): Payer: 59

## 2016-04-12 ENCOUNTER — Emergency Department (HOSPITAL_COMMUNITY): Payer: 59 | Admitting: Registered Nurse

## 2016-04-12 ENCOUNTER — Observation Stay (HOSPITAL_COMMUNITY)
Admission: EM | Admit: 2016-04-12 | Discharge: 2016-04-13 | Disposition: A | Discharge status: Home / Self Care | Payer: 59 | Attending: Orthopedic Surgery | Admitting: Orthopedic Surgery

## 2016-04-12 ENCOUNTER — Encounter (HOSPITAL_COMMUNITY)
Admission: EM | Disposition: A | Discharge status: Home / Self Care | Payer: Self-pay | Source: Home / Self Care | Attending: Emergency Medicine

## 2016-04-12 DIAGNOSIS — Z86718 Personal history of other venous thrombosis and embolism: Secondary | ICD-10-CM | POA: Diagnosis not present

## 2016-04-12 DIAGNOSIS — Z811 Family history of alcohol abuse and dependence: Secondary | ICD-10-CM | POA: Insufficient documentation

## 2016-04-12 DIAGNOSIS — Z85828 Personal history of other malignant neoplasm of skin: Secondary | ICD-10-CM | POA: Diagnosis not present

## 2016-04-12 DIAGNOSIS — F411 Generalized anxiety disorder: Secondary | ICD-10-CM | POA: Insufficient documentation

## 2016-04-12 DIAGNOSIS — M25461 Effusion, right knee: Secondary | ICD-10-CM | POA: Diagnosis not present

## 2016-04-12 DIAGNOSIS — Z96652 Presence of left artificial knee joint: Secondary | ICD-10-CM | POA: Insufficient documentation

## 2016-04-12 DIAGNOSIS — T8130XA Disruption of wound, unspecified, initial encounter: Secondary | ICD-10-CM | POA: Diagnosis not present

## 2016-04-12 DIAGNOSIS — T8131XA Disruption of external operation (surgical) wound, not elsewhere classified, initial encounter: Principal | ICD-10-CM | POA: Insufficient documentation

## 2016-04-12 DIAGNOSIS — Z7901 Long term (current) use of anticoagulants: Secondary | ICD-10-CM | POA: Diagnosis not present

## 2016-04-12 DIAGNOSIS — Z87440 Personal history of urinary (tract) infections: Secondary | ICD-10-CM | POA: Diagnosis not present

## 2016-04-12 DIAGNOSIS — C50912 Malignant neoplasm of unspecified site of left female breast: Secondary | ICD-10-CM | POA: Diagnosis not present

## 2016-04-12 DIAGNOSIS — J329 Chronic sinusitis, unspecified: Secondary | ICD-10-CM | POA: Diagnosis not present

## 2016-04-12 DIAGNOSIS — Z9221 Personal history of antineoplastic chemotherapy: Secondary | ICD-10-CM | POA: Insufficient documentation

## 2016-04-12 DIAGNOSIS — Z79899 Other long term (current) drug therapy: Secondary | ICD-10-CM | POA: Diagnosis not present

## 2016-04-12 DIAGNOSIS — E559 Vitamin D deficiency, unspecified: Secondary | ICD-10-CM | POA: Insufficient documentation

## 2016-04-12 DIAGNOSIS — Z8249 Family history of ischemic heart disease and other diseases of the circulatory system: Secondary | ICD-10-CM | POA: Insufficient documentation

## 2016-04-12 DIAGNOSIS — G620 Drug-induced polyneuropathy: Secondary | ICD-10-CM | POA: Insufficient documentation

## 2016-04-12 DIAGNOSIS — M25561 Pain in right knee: Secondary | ICD-10-CM | POA: Diagnosis not present

## 2016-04-12 DIAGNOSIS — C7989 Secondary malignant neoplasm of other specified sites: Secondary | ICD-10-CM | POA: Diagnosis not present

## 2016-04-12 DIAGNOSIS — T148XXA Other injury of unspecified body region, initial encounter: Secondary | ICD-10-CM | POA: Diagnosis not present

## 2016-04-12 DIAGNOSIS — W19XXXA Unspecified fall, initial encounter: Secondary | ICD-10-CM

## 2016-04-12 DIAGNOSIS — W109XXA Fall (on) (from) unspecified stairs and steps, initial encounter: Secondary | ICD-10-CM | POA: Diagnosis not present

## 2016-04-12 DIAGNOSIS — Z803 Family history of malignant neoplasm of breast: Secondary | ICD-10-CM | POA: Insufficient documentation

## 2016-04-12 DIAGNOSIS — Z880 Allergy status to penicillin: Secondary | ICD-10-CM | POA: Diagnosis not present

## 2016-04-12 DIAGNOSIS — Z96651 Presence of right artificial knee joint: Secondary | ICD-10-CM | POA: Diagnosis not present

## 2016-04-12 DIAGNOSIS — M96831 Postprocedural hemorrhage and hematoma of a musculoskeletal structure following other procedure: Secondary | ICD-10-CM | POA: Diagnosis not present

## 2016-04-12 DIAGNOSIS — M1711 Unilateral primary osteoarthritis, right knee: Secondary | ICD-10-CM | POA: Diagnosis not present

## 2016-04-12 DIAGNOSIS — T8133XA Disruption of traumatic injury wound repair, initial encounter: Secondary | ICD-10-CM | POA: Diagnosis not present

## 2016-04-12 DIAGNOSIS — Z91048 Other nonmedicinal substance allergy status: Secondary | ICD-10-CM | POA: Diagnosis not present

## 2016-04-12 DIAGNOSIS — D649 Anemia, unspecified: Secondary | ICD-10-CM | POA: Diagnosis not present

## 2016-04-12 DIAGNOSIS — Z820 Family history of epilepsy and other diseases of the nervous system: Secondary | ICD-10-CM | POA: Insufficient documentation

## 2016-04-12 HISTORY — PX: IRRIGATION AND DEBRIDEMENT KNEE: SHX5185

## 2016-04-12 LAB — PROTIME-INR
INR: 1.34
PROTHROMBIN TIME: 16.7 s — AB (ref 11.4–15.2)

## 2016-04-12 LAB — BASIC METABOLIC PANEL
ANION GAP: 8 (ref 5–15)
BUN: 14 mg/dL (ref 6–20)
CALCIUM: 9 mg/dL (ref 8.9–10.3)
CO2: 27 mmol/L (ref 22–32)
Chloride: 102 mmol/L (ref 101–111)
Creatinine, Ser: 0.49 mg/dL (ref 0.44–1.00)
GLUCOSE: 87 mg/dL (ref 65–99)
POTASSIUM: 3.4 mmol/L — AB (ref 3.5–5.1)
SODIUM: 137 mmol/L (ref 135–145)

## 2016-04-12 LAB — CBC WITH DIFFERENTIAL/PLATELET
BASOS ABS: 0 10*3/uL (ref 0.0–0.1)
BASOS PCT: 1 %
EOS PCT: 3 %
Eosinophils Absolute: 0.1 10*3/uL (ref 0.0–0.7)
HCT: 27.8 % — ABNORMAL LOW (ref 36.0–46.0)
Hemoglobin: 8.8 g/dL — ABNORMAL LOW (ref 12.0–15.0)
LYMPHS PCT: 17 %
Lymphs Abs: 0.7 10*3/uL (ref 0.7–4.0)
MCH: 30.6 pg (ref 26.0–34.0)
MCHC: 31.7 g/dL (ref 30.0–36.0)
MCV: 96.5 fL (ref 78.0–100.0)
Monocytes Absolute: 0.7 10*3/uL (ref 0.1–1.0)
Monocytes Relative: 17 %
Neutro Abs: 2.4 10*3/uL (ref 1.7–7.7)
Neutrophils Relative %: 62 %
PLATELETS: 287 10*3/uL (ref 150–400)
RBC: 2.88 MIL/uL — AB (ref 3.87–5.11)
RDW: 18.5 % — ABNORMAL HIGH (ref 11.5–15.5)
WBC: 3.9 10*3/uL — AB (ref 4.0–10.5)

## 2016-04-12 LAB — PREPARE RBC (CROSSMATCH)

## 2016-04-12 LAB — HEMOGLOBIN AND HEMATOCRIT, BLOOD
HCT: 32.2 % — ABNORMAL LOW (ref 36.0–46.0)
Hemoglobin: 10.3 g/dL — ABNORMAL LOW (ref 12.0–15.0)

## 2016-04-12 SURGERY — IRRIGATION AND DEBRIDEMENT KNEE
Anesthesia: General | Site: Knee | Laterality: Right

## 2016-04-12 MED ORDER — CEFAZOLIN SODIUM-DEXTROSE 2-4 GM/100ML-% IV SOLN: 2.0000 g | INTRAVENOUS | Status: DC

## 2016-04-12 MED ORDER — OXYCODONE-ACETAMINOPHEN 5-325 MG PO TABS
1.0000 | ORAL_TABLET | ORAL | Status: DC | PRN
  Administered 2016-04-12 – 2016-04-13 (×3): 1 via ORAL
  Filled 2016-04-12: qty 2
  Filled 2016-04-12 (×2): qty 1

## 2016-04-12 MED ORDER — ONDANSETRON HCL 4 MG/2ML IJ SOLN: 4.0000 mg | Freq: Once | INTRAMUSCULAR | Status: DC | PRN

## 2016-04-12 MED ORDER — APIXABAN 5 MG PO TABS
5.0000 mg | ORAL_TABLET | Freq: Two times a day (BID) | ORAL | Status: DC
  Administered 2016-04-13: 2.5 mg via ORAL
  Filled 2016-04-12: qty 1

## 2016-04-12 MED ORDER — CEFAZOLIN SODIUM-DEXTROSE 2-4 GM/100ML-% IV SOLN
INTRAVENOUS | Status: AC
  Filled 2016-04-12: qty 100

## 2016-04-12 MED ORDER — ZOLPIDEM TARTRATE 5 MG PO TABS
5.0000 mg | ORAL_TABLET | Freq: Every evening | ORAL | Status: DC | PRN
  Administered 2016-04-13: 5 mg via ORAL
  Filled 2016-04-12: qty 1

## 2016-04-12 MED ORDER — LIDOCAINE HCL (CARDIAC) 10 MG/ML IV SOLN
INTRAVENOUS | Status: DC | PRN
  Administered 2016-04-12: 100 mg via INTRAVENOUS

## 2016-04-12 MED ORDER — HYDROMORPHONE HCL 1 MG/ML IJ SOLN
INTRAMUSCULAR | Status: AC
  Filled 2016-04-12: qty 1

## 2016-04-12 MED ORDER — BUPIVACAINE HCL 0.25 % IJ SOLN
INTRAMUSCULAR | Status: AC
  Filled 2016-04-12: qty 1

## 2016-04-12 MED ORDER — SODIUM CHLORIDE 0.9 % IR SOLN
Status: DC | PRN
  Administered 2016-04-12: 3000 mL

## 2016-04-12 MED ORDER — ONDANSETRON HCL 4 MG/2ML IJ SOLN
INTRAMUSCULAR | Status: DC | PRN
  Administered 2016-04-12: 4 mg via INTRAVENOUS

## 2016-04-12 MED ORDER — MEPERIDINE HCL 25 MG/ML IJ SOLN: 6.2500 mg | INTRAMUSCULAR | Status: DC | PRN

## 2016-04-12 MED ORDER — PHENYLEPHRINE HCL 10 MG/ML IJ SOLN
INTRAMUSCULAR | Status: DC | PRN
  Administered 2016-04-12 (×2): 160 ug via INTRAVENOUS
  Administered 2016-04-12: 80 ug via INTRAVENOUS

## 2016-04-12 MED ORDER — ONDANSETRON HCL 4 MG/2ML IJ SOLN
INTRAMUSCULAR | Status: AC
  Filled 2016-04-12: qty 2

## 2016-04-12 MED ORDER — POVIDONE-IODINE 10 % EX SWAB: 2.0000 "application " | Freq: Once | CUTANEOUS | Status: DC

## 2016-04-12 MED ORDER — GABAPENTIN 300 MG PO CAPS
300.0000 mg | ORAL_CAPSULE | Freq: Three times a day (TID) | ORAL | Status: DC
  Administered 2016-04-12 – 2016-04-13 (×2): 300 mg via ORAL
  Filled 2016-04-12 (×2): qty 1

## 2016-04-12 MED ORDER — FENTANYL CITRATE (PF) 100 MCG/2ML IJ SOLN
INTRAMUSCULAR | Status: DC | PRN
  Administered 2016-04-12: 25 ug via INTRAVENOUS
  Administered 2016-04-12: 100 ug via INTRAVENOUS
  Administered 2016-04-12: 75 ug via INTRAVENOUS

## 2016-04-12 MED ORDER — LIDOCAINE 2% (20 MG/ML) 5 ML SYRINGE
INTRAMUSCULAR | Status: AC
  Filled 2016-04-12: qty 5

## 2016-04-12 MED ORDER — SODIUM CHLORIDE 0.9 % IV SOLN
INTRAVENOUS | Status: DC | PRN
  Administered 2016-04-12: 15:00:00 via INTRAVENOUS

## 2016-04-12 MED ORDER — METHOCARBAMOL 500 MG PO TABS
500.0000 mg | ORAL_TABLET | Freq: Four times a day (QID) | ORAL | Status: DC | PRN
  Administered 2016-04-13: 500 mg via ORAL
  Filled 2016-04-12: qty 1

## 2016-04-12 MED ORDER — CEFAZOLIN SODIUM-DEXTROSE 2-4 GM/100ML-% IV SOLN
2.0000 g | Freq: Three times a day (TID) | INTRAVENOUS | Status: AC
  Administered 2016-04-12 – 2016-04-13 (×2): 2 g via INTRAVENOUS
  Filled 2016-04-12 (×3): qty 100

## 2016-04-12 MED ORDER — CEFAZOLIN SODIUM-DEXTROSE 2-3 GM-% IV SOLR
INTRAVENOUS | Status: DC | PRN
  Administered 2016-04-12: 2 g via INTRAVENOUS

## 2016-04-12 MED ORDER — ACETAMINOPHEN 650 MG RE SUPP: 650.0000 mg | Freq: Four times a day (QID) | RECTAL | Status: DC | PRN

## 2016-04-12 MED ORDER — SODIUM CHLORIDE 0.9 % IV SOLN: Freq: Once | INTRAVENOUS | Status: DC

## 2016-04-12 MED ORDER — FENTANYL CITRATE (PF) 100 MCG/2ML IJ SOLN
INTRAMUSCULAR | Status: AC
  Filled 2016-04-12: qty 2

## 2016-04-12 MED ORDER — PROPOFOL 10 MG/ML IV BOLUS
INTRAVENOUS | Status: DC | PRN
  Administered 2016-04-12: 130 mg via INTRAVENOUS

## 2016-04-12 MED ORDER — DEXAMETHASONE SODIUM PHOSPHATE 10 MG/ML IJ SOLN
INTRAMUSCULAR | Status: AC
  Filled 2016-04-12: qty 1

## 2016-04-12 MED ORDER — MORPHINE SULFATE (PF) 2 MG/ML IV SOLN: 2.0000 mg | INTRAVENOUS | Status: DC | PRN

## 2016-04-12 MED ORDER — PROPOFOL 10 MG/ML IV BOLUS
INTRAVENOUS | Status: AC
  Filled 2016-04-12: qty 20

## 2016-04-12 MED ORDER — ONDANSETRON HCL 4 MG PO TABS
8.0000 mg | ORAL_TABLET | Freq: Three times a day (TID) | ORAL | Status: DC | PRN
Start: 2016-04-12 — End: 2016-04-13

## 2016-04-12 MED ORDER — SUCCINYLCHOLINE CHLORIDE 20 MG/ML IJ SOLN
INTRAMUSCULAR | Status: DC | PRN
  Administered 2016-04-12: 120 mg via INTRAVENOUS

## 2016-04-12 MED ORDER — HYDROMORPHONE HCL 1 MG/ML IJ SOLN
0.2500 mg | INTRAMUSCULAR | Status: DC | PRN
  Administered 2016-04-12 (×2): 0.5 mg via INTRAVENOUS

## 2016-04-12 MED ORDER — SODIUM CHLORIDE 0.9 % IV SOLN: 10.0000 mL/h | Freq: Once | INTRAVENOUS | Status: DC

## 2016-04-12 MED ORDER — DEXAMETHASONE SODIUM PHOSPHATE 10 MG/ML IJ SOLN
INTRAMUSCULAR | Status: DC | PRN
  Administered 2016-04-12: 10 mg via INTRAVENOUS

## 2016-04-12 MED ORDER — SODIUM CHLORIDE 0.9 % IV SOLN
INTRAVENOUS | Status: DC | PRN
  Administered 2016-04-12: 14:00:00 via INTRAVENOUS

## 2016-04-12 MED ORDER — DOCUSATE SODIUM 100 MG PO CAPS: 100.0000 mg | ORAL_CAPSULE | Freq: Two times a day (BID) | ORAL | Status: DC | PRN

## 2016-04-12 MED ORDER — DARIFENACIN HYDROBROMIDE ER 15 MG PO TB24
15.0000 mg | ORAL_TABLET | Freq: Every day | ORAL | Status: DC
  Administered 2016-04-13: 15 mg via ORAL
  Filled 2016-04-12: qty 1

## 2016-04-12 MED ORDER — BUPIVACAINE HCL (PF) 0.25 % IJ SOLN
INTRAMUSCULAR | Status: DC | PRN
  Administered 2016-04-12: 30 mL

## 2016-04-12 MED ORDER — SCOPOLAMINE 1 MG/3DAYS TD PT72
MEDICATED_PATCH | TRANSDERMAL | Status: DC | PRN
  Administered 2016-04-12: 1 via TRANSDERMAL

## 2016-04-12 MED ORDER — SODIUM CHLORIDE 0.9% FLUSH
10.0000 mL | INTRAVENOUS | Status: DC | PRN
  Administered 2016-04-13: 10 mL
  Filled 2016-04-12: qty 40

## 2016-04-12 MED ORDER — POLYETHYLENE GLYCOL 3350 17 G PO PACK
17.0000 g | PACK | Freq: Every day | ORAL | Status: DC
  Filled 2016-04-12 (×2): qty 1

## 2016-04-12 MED ORDER — MIDAZOLAM HCL 2 MG/2ML IJ SOLN
INTRAMUSCULAR | Status: AC
  Filled 2016-04-12: qty 2

## 2016-04-12 MED ORDER — CELECOXIB 200 MG PO CAPS
200.0000 mg | ORAL_CAPSULE | Freq: Two times a day (BID) | ORAL | Status: DC
  Administered 2016-04-12 – 2016-04-13 (×2): 200 mg via ORAL
  Filled 2016-04-12 (×2): qty 1

## 2016-04-12 MED ORDER — ACETAMINOPHEN 325 MG PO TABS: 650.0000 mg | ORAL_TABLET | Freq: Four times a day (QID) | ORAL | Status: DC | PRN

## 2016-04-12 MED ORDER — 0.9 % SODIUM CHLORIDE (POUR BTL) OPTIME
TOPICAL | Status: DC | PRN
  Administered 2016-04-12: 1000 mL

## 2016-04-12 MED ORDER — CHLORHEXIDINE GLUCONATE 4 % EX LIQD: 60.0000 mL | Freq: Once | CUTANEOUS | Status: DC

## 2016-04-12 MED ORDER — SCOPOLAMINE 1 MG/3DAYS TD PT72
MEDICATED_PATCH | TRANSDERMAL | Status: AC
  Filled 2016-04-12: qty 1

## 2016-04-12 SURGICAL SUPPLY — 54 items
ADH SKN CLS APL DERMABOND .7 (GAUZE/BANDAGES/DRESSINGS)
BAG SPEC THK2 15X12 ZIP CLS (MISCELLANEOUS) ×1
BAG ZIPLOCK 12X15 (MISCELLANEOUS) ×2 IMPLANT
BANDAGE ACE 6X5 VEL STRL LF (GAUZE/BANDAGES/DRESSINGS) ×2 IMPLANT
BANDAGE ESMARK 6X9 LF (GAUZE/BANDAGES/DRESSINGS) ×1 IMPLANT
BNDG CMPR 9X6 STRL LF SNTH (GAUZE/BANDAGES/DRESSINGS) ×1
BNDG ESMARK 6X9 LF (GAUZE/BANDAGES/DRESSINGS) ×2
CUFF TOURN SGL QUICK 34 (TOURNIQUET CUFF) ×2
CUFF TRNQT CYL 34X4X40X1 (TOURNIQUET CUFF) ×1 IMPLANT
DERMABOND ADVANCED (GAUZE/BANDAGES/DRESSINGS)
DERMABOND ADVANCED .7 DNX12 (GAUZE/BANDAGES/DRESSINGS) ×1 IMPLANT
DRAPE EXTREMITY T 121X128X90 (DRAPE) ×2 IMPLANT
DRSG ADAPTIC 3X8 NADH LF (GAUZE/BANDAGES/DRESSINGS) ×1 IMPLANT
DRSG AQUACEL AG ADV 3.5X10 (GAUZE/BANDAGES/DRESSINGS) IMPLANT
DRSG PAD ABDOMINAL 8X10 ST (GAUZE/BANDAGES/DRESSINGS) ×1 IMPLANT
DRSG TEGADERM 4X4.75 (GAUZE/BANDAGES/DRESSINGS) ×1 IMPLANT
DURAPREP 26ML APPLICATOR (WOUND CARE) ×2 IMPLANT
ELECT REM PT RETURN 9FT ADLT (ELECTROSURGICAL) ×2
ELECTRODE REM PT RTRN 9FT ADLT (ELECTROSURGICAL) ×1 IMPLANT
EVACUATOR 1/8 PVC DRAIN (DRAIN) ×2 IMPLANT
GAUZE SPONGE 2X2 8PLY STRL LF (GAUZE/BANDAGES/DRESSINGS) ×1 IMPLANT
GAUZE SPONGE 4X4 12PLY STRL (GAUZE/BANDAGES/DRESSINGS) ×2 IMPLANT
GLOVE BIOGEL M 7.0 STRL (GLOVE) IMPLANT
GLOVE BIOGEL PI IND STRL 7.5 (GLOVE) ×1 IMPLANT
GLOVE BIOGEL PI IND STRL 8.5 (GLOVE) ×1 IMPLANT
GLOVE BIOGEL PI INDICATOR 7.5 (GLOVE) ×1
GLOVE BIOGEL PI INDICATOR 8.5 (GLOVE)
GLOVE ECLIPSE 8.0 STRL XLNG CF (GLOVE) IMPLANT
GLOVE ORTHO TXT STRL SZ7.5 (GLOVE) ×4 IMPLANT
GLOVE SURG ORTHO 8.0 STRL STRW (GLOVE) ×2 IMPLANT
GOWN STRL REUS W/TWL LRG LVL3 (GOWN DISPOSABLE) ×2 IMPLANT
GOWN STRL REUS W/TWL XL LVL3 (GOWN DISPOSABLE) ×4 IMPLANT
HANDPIECE INTERPULSE COAX TIP (DISPOSABLE) ×2
IMMOBILIZER KNEE 20 (SOFTGOODS) ×1 IMPLANT
KIT BASIN OR (CUSTOM PROCEDURE TRAY) ×2 IMPLANT
KIT PREVENA INCISION MGT20CM45 (CANNISTER) ×2 IMPLANT
MANIFOLD NEPTUNE II (INSTRUMENTS) ×2 IMPLANT
PACK ORTHO EXTREMITY (CUSTOM PROCEDURE TRAY) ×2 IMPLANT
PACK TOTAL JOINT (CUSTOM PROCEDURE TRAY) ×1 IMPLANT
PADDING CAST COTTON 6X4 STRL (CAST SUPPLIES) ×1 IMPLANT
POSITIONER SURGICAL ARM (MISCELLANEOUS) ×2 IMPLANT
SET HNDPC FAN SPRY TIP SCT (DISPOSABLE) ×1 IMPLANT
SPONGE GAUZE 2X2 STER 10/PKG (GAUZE/BANDAGES/DRESSINGS)
STAPLER VISISTAT 35W (STAPLE) ×1 IMPLANT
SUT MNCRL AB 4-0 PS2 18 (SUTURE) ×1 IMPLANT
SUT MON AB 2-0 CT1 27 (SUTURE) ×1 IMPLANT
SUT PDS AB 1 CT1 27 (SUTURE) ×2 IMPLANT
SUT VIC AB 1 CT1 36 (SUTURE) ×4 IMPLANT
SUT VIC AB 2-0 CT1 27 (SUTURE)
SUT VIC AB 2-0 CT1 TAPERPNT 27 (SUTURE) ×3 IMPLANT
SUT VLOC 180 0 24IN GS25 (SUTURE) ×2 IMPLANT
SWAB COLLECTION DEVICE MRSA (MISCELLANEOUS) ×1 IMPLANT
SWAB CULTURE ESWAB REG 1ML (MISCELLANEOUS) ×2 IMPLANT
TOWEL OR 17X26 10 PK STRL BLUE (TOWEL DISPOSABLE) ×4 IMPLANT

## 2016-04-12 NOTE — Op Note (Addendum)
Date of Surgery: 04/12/2016  INDICATIONS: Ms. Zar is a 60 y.o.-year-old female with a right knee wound dehiscence following fall onto the right knee down the stairs. She had immediate bleeding and open wound from her prior anterior knee incision from her total knee replacement 3 weeks prior.  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  After examining her and meeting her in the emergency department the decision was made to proceed with OR exploration to inspect the arthrotomy closure and debride the wound and close in a sterile environment. ;  The patient did consent to the procedure after discussion of the risks and benefits.  PREOPERATIVE DIAGNOSIS: Right knee wound dehiscence   POSTOPERATIVE DIAGNOSIS: Same.  PROCEDURE:  1.  Exploration of traumatic wound dehiscence with complex closure in a layered fashion 2.  Irrigation and debridement of skin, muscle and subcutaneous tissue 3.  Application of an incisional wound VAC   SURGEON: Geralynn Rile, M.D.  ASSIST: none.  ANESTHESIA:  general  IV FLUIDS AND URINE: See anesthesia.  ESTIMATED BLOOD LOSS: 250 mL.  IMPLANTS: none  DRAINS: 1 medium Hemovac drain in the knee joint  COMPLICATIONS: None.  DESCRIPTION OF PROCEDURE: The patient was brought to the operating room and placed supine on the operating table.  The patient had been signed prior to the procedure and this was documented. The patient had the anesthesia placed by the anesthesiologist.  A time-out was performed to confirm that this was the correct patient, site, side and location. The patient did receive antibiotics prior to the incision and was re-dosed during the procedure as needed at indicated intervals.  A tourniquet was placed, but not inflated.  The patient had the operative extremity prepped and draped in the standard surgical fashion.     We began the case by debriding the superficial wound of the hematoma that  had organized following fall. Upon inspection of the arthrotomy was noted that the middle one third had dehisced during the fall. The knee joint itself was noted to be filled with organized hematoma this was evacuated bluntly.  The implants on the femur, tibia and poly on the back of the patella and between the components was well positioned and not loose.  The suture was removed from the arthrotomy that had become broken and loose during fall. The edges of the arthrotomy were then sharply freshened with a scalpel and rongeur. Next, our attention was turned to the superficial aspect of the wound. The old Vicryl sutures were removed with rongeur. The skin edges were freshened sharply with a scalpel to bleeding dermis along the entire wound on both sides. A rongeur was then used to debride a subsequent tissue that looked to be devascularized following her trauma. Next the knee joint itself was irrigated copiously with 3 L of normal saline. Following that the superficial component of the knee wound was irrigated again with 3 L of normal saline. Following irrigation any other devitalized tissue that was noted was removed sharply with rongeur and scalpel. Next deep drain was placed into the knee joint itself with a medium Hemovac drain. This was passed up the superior lateral aspect of the knee. Hemostasis was achieved with electrocautery. Next a layered closure was began. The arthrotomy was closed with a #1 PDS using interrupted figure-of-eight stitches. This was inspected while ranging the knee from 0-90 and noted to be intact throughout. After the adequate closure of the arthrotomy was noted within  moved to layered closure of the skin. Deep subcuticular layer was closed with 2-0 Monocryl in a ruptured sutures. Skin itself was closed with horizontal mattress sutures using nylon 3-0.  Marcaine was injected into the wound for pain control.  The leg was cleaned one last time and dried. We next applied a negative pressure  vacuum assisted closure device to the incision. This was sealed and the vacuum device was attached to the sponge and a good seal was noted.  All counts were correct 2 there were no immediate complications the patient was awakened from general anesthesia without issue and transferred to the PACU.   POSTOPERATIVE PLAN: Admit to the floor with 3 doses of Ancef iv for surgical prophylaxis.  She has a drain that will be pulled tomorrow before dc home, and is placed in a knee immobilizer to be worn with weight bearing until follow up appointment.  She will keep the incisional VAC in place until her follow up appointment as well.  We will dc her home tomorrow afternoon once her abx are complete.  She will return home on 7 days of oral Keflex and return to see Dr. Tonita Cong in 7-10 days for wound check.  She did receive 2 units of pRBCs for her chronic anemia, and blood loss during surgery, ahead of her scheduled transfusion on Monday with her hematologist.

## 2016-04-12 NOTE — Anesthesia Postprocedure Evaluation (Signed)
Anesthesia Post Note  Patient: [name]  Procedure(s) Performed: Procedure(s) (LRB): IRRIGATION AND DEBRIDEMENT KNEE (Right)  Patient location during evaluation: PACU Anesthesia Type: General Level of consciousness: awake and alert Pain management: pain level controlled Vital Signs Assessment: post-procedure vital signs reviewed and stable Respiratory status: spontaneous breathing, nonlabored ventilation, respiratory function stable and patient connected to nasal cannula oxygen Cardiovascular status: blood pressure returned to baseline and stable Postop Assessment: no signs of nausea or vomiting Anesthetic complications: no    Last Vitals:  Vitals:   04/12/16 1614 04/12/16 1615  BP:  (!) 142/76  Pulse: 98 97  Resp: 14 17  Temp:  36.6 C    Last Pain:  Vitals:   04/12/16 1159  TempSrc:   PainSc: 0-No pain                 Ryanna Teschner DAVID

## 2016-04-12 NOTE — ED Triage Notes (Signed)
Patient here from home with complaints of fall today. Reports surgery to right knee last Thursday. Wound opened to right knee after fall. Pain 4/10.

## 2016-04-12 NOTE — ED Provider Notes (Signed)
[facility] DEPT Provider Note   CSN: [id number] Arrival date & time: 04/12/16  1129     History   Chief Complaint Chief Complaint  Patient presents with  . Fall  . Wound Dehiscence    HPI   Blood pressure 140/70, pulse 105, temperature 98.8 F (37.1 C), temperature source Oral, resp. rate 18, last menstrual period 06/22/2007, SpO2 100 %.  [name] is a 60 y.o. female complaining of Dehiscence to right knee total replacement status post mechanical fall. Patient was walking down carpeted steps this morning, she was wearing shoes, she felt her feet slipped and she fell down 2 steps, she braced herself with her hands. There was no head trauma. Patient is chronically anticoagulated with Xarelto, past medical history significant for metastatic breast cancer with VT around Port-A-Cath. States pain is minor, denies numbness or weakness. Denies headache, cervicalgia, change in vision, chest pain, shortness of breath. She denies any pulsatile or difficult to control bleeding. Last oral intake was this morning at about 10 AM   Past Medical History:  Diagnosis Date  . 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 Arthritis KNEES   right knee, hx. past left knee replacemnt.  . Blood clot in vein    around Portacath right chest-tx. Eliquis about a yr., prior Lovenox.  . Chronic anticoagulation HX  PORT-A-CATH CLOT   2010 - HAS BEEN ON LOVENOX SINCE THE CLOT  . Elevated blood pressure reading without diagnosis of hypertension    PT MONITORS HER B/P AT HOME  . Heart murmur   . 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 PONV (postoperative nausea and vomiting)   . Skin cancer of anterior chest BREAST CANCER PRIMARY W/ METS TO CHEST WALL SKIN CANCER--  CHEMOTHERAPY EVERY 3 WEEKS AT [facility]  . Status post skin flap graft    right chest wall with metastatis right  anterior chest -no drainage or open wound.    Patient Active Problem List   Diagnosis Date Noted  . 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 Vitamin D deficiency 01/25/2016  . Metastatic cancer to chest wall ([place]) 02/02/2015  . Fever and chills 02/01/2015  . Primary osteoarthritis of right knee 02/01/2015  . Anemia 02/01/2015  . Frequent UTI 01/12/2014  . Sinus tachycardia 11/03/2013  . DOE (dyspnea on exertion) 11/03/2013  . Postoperative anemia due to acute blood loss 12/24/2012  .    09/23/2012  . Sinusitis 07/28/2012  . Neuropathy due to chemotherapeutic drug ([place]) 07/25/2011  . 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 Breast cancer, left breast ([place]) 11/12/2010  . Chronic anticoagulation 11/12/2010  . Anxiety, generalized 03/26/2008    Past Surgical History:  Procedure Laterality Date  . CHEST WALL TUMOR EXCISION    . hemotoma     evacuation left chest wall  . KNEE ARTHROSCOPY  02/13/2012   Procedure: ARTHROSCOPY KNEE;  Surgeon: Johnn Hai, MD;  Location: [facility];  Service: Orthopedics;  Laterality: Left;  WITH DEBRIDEMENt   . KNEE ARTHROSCOPY WITH LATERAL MENISECTOMY  02/13/2012   Procedure: KNEE ARTHROSCOPY WITH LATERAL MENISECTOMY;  Surgeon: Johnn Hai, MD;  Location: [facility];  Service: Orthopedics;;  partial  . LEFT MODIFIED RADICAL MASTECTOMY/ RIGHT TOTAL MASTECTOMY  11-13-2007   LEFT BREAST CANCER W/ AXILLARY LYMPH NODE METASTASIS AND POST NEOADJUVANT CHEMO  .  PLACEMENT PORT-A-CATH  06/22/2007   right chest  . SKIN GRAFT     chest-post tumor removal, second occurrence of cancer" 2015 surgery for Flap 2015"  . TOTAL KNEE ARTHROPLASTY Left 09/23/2012   Procedure: LEFT TOTAL KNEE ARTHROPLASTY;  Surgeon: Johnn Hai, MD;  Location: [facility];  Service: Orthopedics;  Laterality: Left;  . TOTAL KNEE ARTHROPLASTY Right 03/20/2016   Procedure: RIGHT TOTAL KNEE ARTHROPLASTY;  Surgeon: Susa Day, MD;  Location: [facility];  Service:  Orthopedics;  Laterality: Right;  . TRANSTHORACIC ECHOCARDIOGRAM  11-18-2011  DR BENSIMHON (ECHO EVERY 3 MONTHS)   HX CHEMO INDUCED CARDIOTOXICITY/  LVSF NORMAL/ EF 55-50%/ MILD MITRIAL VALVE REGURG./ MILDLY INCREASED SYSTOLIC PRESSURE OF PULMONARY ARTERIES    OB History    No data available       Home Medications    Prior to Admission medications   Medication Sig Start Date End Date Taking? Authorizing Provider  Ado-Trastuzumab Emtansine (KADCYLA IV) Inject into the vein. 2.2m/kg/ [place] Next dose due 03-17-16    Historical Provider, MD  apixaban (ELIQUIS) 5 MG TABS tablet Take 5 mg by mouth 2 (two) times daily. Will stop prior to proceure 09/10/15   Historical Provider, MD  celecoxib (CELEBREX) 200 MG capsule TAKE 1 CAPSULE BY MOUTH TWICE DAILY 04/02/16   KMidge Minium MD  Cholecalciferol (VITAMIN D3) 2000 UNITS TABS Take 1 tablet by mouth 4 (four) times a week.     Historical Provider, MD  darifenacin (ENABLEX) 15 MG 24 hr tablet Take 15 mg by mouth daily.    Historical Provider, MD  docusate sodium (COLACE) 100 MG capsule Take 1 capsule (100 mg total) by mouth 2 (two) times daily as needed for mild constipation. 03/20/16   JSusa Day MD  gabapentin (NEURONTIN) 300 MG capsule TAKE 1 CAPSULE BY MOUTH 3 TIMES DAILY 02/11/16   KMidge Minium MD  lidocaine (LIDODERM) 5 % APPLY 1 PATCH TO AFFECTED AREA NO MORE THAN 12 HOURS IN A 24 HOUR PERIOD AS NEEDED FOR PAIN 01/17/16   Historical Provider, MD  methocarbamol (ROBAXIN) 500 MG tablet Take 1 tablet (500 mg total) by mouth every 6 (six) hours as needed for muscle spasms. 03/20/16   JSusa Day MD  ondansetron (ZOFRAN) 8 MG tablet Take 1 tablet (8 mg total) by mouth every 8 (eight) hours as needed for nausea. 06/19/11   WZenia Resides MD  oxyCODONE-acetaminophen (PERCOCET) 5-325 MG tablet Take 1-2 tablets by mouth every 4 (four) hours as needed for severe pain. 03/20/16   JSusa Day MD  polyethylene glycol ([facility]/ GLYCOLAX) packet Take 17 g by mouth daily. 03/20/16   JSusa Day MD  traMADol (ULTRAM) 50 MG tablet TAKE 1 TABLET BY MOUTH EVERY 6 HOURS AS NEEDED FOR PAIN 01/09/15   Doe-Hyun R YShawna Orleans DO  zolpidem (AMBIEN) 5 MG tablet TAKE 1 TABLET BY MOUTH AT BEDTIME AS NEEDED FOR SLEEP 03/17/16   KMidge Minium MD    Family History Family History  Problem Relation Age of Onset  . Heart disease Mother     due to mitral valve regurgiation,   . Cancer Mother     breast  . Parkinsonism Father   . Alcohol abuse Paternal Grandfather     Social History Social History  Substance Use Topics  . Smoking status: Never Smoker  . Smokeless tobacco: Never Used  . Alcohol use No     Allergies   Tape;  Other; and Penicillins   Review of Systems Review of Systems  10 systems reviewed and found to be negative, except as noted in the HPI.   Physical Exam Updated Vital Signs BP 140/70 (BP Location: Right Arm)   Pulse 105   Temp 98.8 F (37.1 C) (Oral)   Resp 18   LMP 06/22/2007   SpO2 100%   Physical Exam  Constitutional: She is oriented to person, place, and time. She appears well-developed and well-nourished. No distress.  HENT:  Head: Normocephalic and atraumatic.  Mouth/Throat: Oropharynx is clear and moist.  Eyes: Conjunctivae and EOM are normal. Pupils are equal, round, and reactive to light.  Neck: Normal range of motion.  Cardiovascular: Normal rate, regular rhythm and intact distal pulses.   Pulmonary/Chest: Effort normal and breath sounds normal.  Abdominal: Soft. There is no tenderness.  Musculoskeletal:  Full dehiscence of surgical scar on left knee, open approximately 2 cm. No active or pulsatile bleeding, hardware is not visible, distally neurovascularly intact, good range of motion, entire leg is swollen which she states is typical for her baseline.  Neurological: She is alert and oriented to person, place, and time.  Skin: She is  not diaphoretic.  Psychiatric: She has a normal mood and affect.  Nursing note and vitals reviewed.    ED Treatments / Results  Labs (all labs ordered are listed, but only abnormal results are displayed) Labs Reviewed  CBC WITH DIFFERENTIAL/PLATELET - Abnormal; Notable for the following:       Result Value   WBC 3.9 (*)    RBC 2.88 (*)    Hemoglobin 8.8 (*)    HCT 27.8 (*)    RDW 18.5 (*)    All other components within normal limits  BASIC METABOLIC PANEL  PROTIME-INR    EKG  EKG Interpretation None       Radiology Dg Knee Complete 4 Views Right  Result Date: 04/12/2016 CLINICAL DATA:  Initial encounter for Pt fell down three steps today. States her incision tore from where she had total right knee replacement x 3 weeks ago. Denies pain. EXAM: RIGHT KNEE - COMPLETE 4+ VIEW COMPARISON:  03/20/2016 FINDINGS: Total knee arthroplasty. No fracture or acute hardware complication. Enthesophyte at the quadriceps insertion. IMPRESSION: No acute osseous abnormality. Total knee arthroplasty, without acute hardware complication. Electronically Signed   By: Abigail Miyamoto M.D.   On: 04/12/2016 12:55    Procedures Procedures (including critical care time)  Medications Ordered in ED Medications - No data to display   Initial Impression / Assessment and Plan / ED Course  I have reviewed the triage vital signs and the nursing notes.  Pertinent labs & imaging results that were available during my care of the patient were reviewed by me and considered in my medical decision making (see chart for details).  Clinical Course    Vitals:   04/12/16 1136  BP: 140/70  Pulse: 105  Resp: 18  Temp: 98.8 F (37.1 C)  TempSrc: Oral  SpO2: 100%    Medications - No data to display  [name] is 60 y.o. female presenting with Wound dehiscence status post mechanical fall. Patient declines pain medication in the ED.  Orthopedic consult from Dr. Stann Mainland appreciated: States that she will  probably have to go to the OR and asked that we get preop blood work and keep her nothing by mouth.  Blood work with hemoglobin of 8.8. Patient will be admitted for operative exploration of the wound dehiscence.  Final Clinical Impressions(s) / ED Diagnoses   Final diagnoses:  Wound dehiscence  Fall, initial encounter    New Prescriptions New Prescriptions   No medications on file     Monico Blitz, PA-C 04/12/16 1345    Lacretia Leigh, MD 04/14/16 1704

## 2016-04-12 NOTE — Consult Note (Signed)
ORTHOPAEDIC [facility] and P  REQUESTING PHYSICIAN: Lacretia Leigh, MD  PCP:  Annye Asa, MD  Chief Complaint: Right knee wound dehiscence  HPI: [name] is a 60 y.o. female who complains of  Right knee surgical incision opened up after a fall onto the right knee earlier today.  She had obvious dehiscence of her knee wound and bleeding.  She is a Breast CA patient on chemo and also has hx of dvt that she takes eliquis for.  Denies antecedent fevers or drainage.  In fact she was doing quite well prior to the fall today.  She currently denies any numbness or distal deficits.   Past Medical History:  Diagnosis Date  . 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 Arthritis KNEES   right knee, hx. past left knee replacemnt.  . Blood clot in vein    around Portacath right chest-tx. Eliquis about a yr., prior Lovenox.  . Chronic anticoagulation HX  PORT-A-CATH CLOT   2010 - HAS BEEN ON LOVENOX SINCE THE CLOT  . Elevated blood pressure reading without diagnosis of hypertension    PT MONITORS HER B/P AT HOME  . Heart murmur   . 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 PONV (postoperative nausea and vomiting)   . Skin cancer of anterior chest BREAST CANCER PRIMARY W/ METS TO CHEST WALL SKIN CANCER--  CHEMOTHERAPY EVERY 3 WEEKS AT [facility]  . Status post skin flap graft    right chest wall with metastatis right anterior chest -no drainage or open wound.   Past Surgical History:  Procedure Laterality Date  . CHEST WALL TUMOR EXCISION    . hemotoma     evacuation left chest wall  . KNEE ARTHROSCOPY  02/13/2012   Procedure: ARTHROSCOPY KNEE;  Surgeon: Johnn Hai, MD;  Location: [facility];  Service: Orthopedics;  Laterality: Left;  WITH DEBRIDEMENt   . KNEE ARTHROSCOPY WITH LATERAL MENISECTOMY  02/13/2012   Procedure: KNEE ARTHROSCOPY WITH  LATERAL MENISECTOMY;  Surgeon: Johnn Hai, MD;  Location: [facility];  Service: Orthopedics;;  partial  . LEFT MODIFIED RADICAL MASTECTOMY/ RIGHT TOTAL MASTECTOMY  11-13-2007   LEFT BREAST CANCER W/ AXILLARY LYMPH NODE METASTASIS AND POST NEOADJUVANT CHEMO  . PLACEMENT PORT-A-CATH  06/22/2007   right chest  . SKIN GRAFT     chest-post tumor removal, second occurrence of cancer" 2015 surgery for Flap 2015"  . TOTAL KNEE ARTHROPLASTY Left 09/23/2012   Procedure: LEFT TOTAL KNEE ARTHROPLASTY;  Surgeon: Johnn Hai, MD;  Location: [facility];  Service: Orthopedics;  Laterality: Left;  . TOTAL KNEE ARTHROPLASTY Right 03/20/2016   Procedure: RIGHT TOTAL KNEE ARTHROPLASTY;  Surgeon: Susa Day, MD;  Location: [facility];  Service: Orthopedics;  Laterality: Right;  . TRANSTHORACIC ECHOCARDIOGRAM  11-18-2011  DR BENSIMHON (ECHO EVERY 3 MONTHS)   HX CHEMO INDUCED CARDIOTOXICITY/  LVSF NORMAL/ EF 55-50%/ MILD MITRIAL VALVE REGURG./ MILDLY INCREASED SYSTOLIC PRESSURE OF PULMONARY ARTERIES   Social History   Social History  . Marital status: Married    Spouse name: N/A  . Number of children: 3  . Years of education: N/A   Occupational History  . 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 Smoking status: Never Smoker  . Smokeless tobacco: Never Used  .  Alcohol use No  . Drug use: No  . Sexual activity: Not Asked   Other Topics Concern  . 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         Family History  Problem Relation Age of Onset  . Heart disease Mother     due to mitral valve regurgiation,   . Cancer Mother     breast  . Parkinsonism Father   . Alcohol abuse Paternal Grandfather    Allergies  Allergen Reactions  . Tape Hives    Can tolerate paper tape  . Other Rash    STERI STRIPS - Blisters  . Penicillins  Rash    Denies airway involvement Has patient had a PCN reaction causing immediate rash, facial/tongue/throat swelling, SOB or lightheadedness with hypotension: No Has patient had a PCN reaction causing severe rash involving mucus membranes or skin necrosis: No Has patient had a PCN reaction that required hospitalization No Has patient had a PCN reaction occurring within the last 10 years: No If all of the above answers are "NO", then may proceed with Cephalosporin use.     Prior to Admission medications   Medication Sig Start Date End Date Taking? Authorizing Provider  Ado-Trastuzumab Emtansine (KADCYLA IV) Inject into the vein. 2.49m/kg/ Chemotherapy drug [place] Next dose due 03-17-16    Historical Provider, MD  apixaban (ELIQUIS) 5 MG TABS tablet Take 5 mg by mouth 2 (two) times daily. Will stop prior to proceure 09/10/15   Historical Provider, MD  celecoxib (CELEBREX) 200 MG capsule TAKE 1 CAPSULE BY MOUTH TWICE DAILY 04/02/16   KMidge Minium MD  Cholecalciferol (VITAMIN D3) 2000 UNITS TABS Take 1 tablet by mouth 4 (four) times a week.     Historical Provider, MD  darifenacin (ENABLEX) 15 MG 24 hr tablet Take 15 mg by mouth daily.    Historical Provider, MD  docusate sodium (COLACE) 100 MG capsule Take 1 capsule (100 mg total) by mouth 2 (two) times daily as needed for mild constipation. 03/20/16   JSusa Day MD  gabapentin (NEURONTIN) 300 MG capsule TAKE 1 CAPSULE BY MOUTH 3 TIMES DAILY 02/11/16   KMidge Minium MD  lidocaine (LIDODERM) 5 % APPLY 1 PATCH TO AFFECTED AREA NO MORE THAN 12 HOURS IN A 24 HOUR PERIOD AS NEEDED FOR PAIN 01/17/16   Historical Provider, MD  methocarbamol (ROBAXIN) 500 MG tablet Take 1 tablet (500 mg total) by mouth every 6 (six) hours as needed for muscle spasms. 03/20/16   JSusa Day MD  ondansetron (ZOFRAN) 8 MG tablet Take 1 tablet (8 mg total) by mouth every 8 (eight) hours as needed for nausea. 06/19/11   WZenia Resides MD  oxyCODONE-acetaminophen (PERCOCET) 5-325 MG tablet Take 1-2 tablets by mouth every 4 (four) hours as needed for severe pain. 03/20/16   JSusa Day MD  polyethylene glycol ([facility]/ GLYCOLAX) packet Take 17 g by mouth daily. 03/20/16   JSusa Day MD  traMADol (ULTRAM) 50 MG tablet TAKE 1 TABLET BY MOUTH EVERY 6 HOURS AS NEEDED FOR PAIN 01/09/15   Doe-Hyun R YShawna Orleans DO  zolpidem (AMBIEN) 5 MG tablet TAKE 1 TABLET BY MOUTH AT BEDTIME AS NEEDED FOR SLEEP 03/17/16   KMidge Minium MD   Dg  Knee Complete 4 Views Right  Result Date: 04/12/2016 CLINICAL DATA:  Initial encounter for Pt fell down three steps today. States her incision tore from where she had total right knee replacement x 3 weeks ago. Denies pain. EXAM: RIGHT KNEE - COMPLETE 4+ VIEW COMPARISON:  03/20/2016 FINDINGS: Total knee arthroplasty. No fracture or acute hardware complication. Enthesophyte at the quadriceps insertion. IMPRESSION: No acute osseous abnormality. Total knee arthroplasty, without acute hardware complication. Electronically Signed   By: Abigail Miyamoto M.D.   On: 04/12/2016 12:55    Positive ROS: All other systems have been reviewed and were otherwise negative with the exception of those mentioned in the HPI and as above.  Physical Exam: General: Alert, no acute distress Cardiovascular: No pedal edema Respiratory: No cyanosis, no use of accessory musculature GI: No organomegaly, abdomen is soft and non-tender Skin: No lesions in the area of chief complaint Neurologic: Sensation intact distally Psychiatric: Patient is competent for consent with normal mood and affect Lymphatic: No axillary or cervical lymphadenopathy  MUSCULOSKELETAL:  RLE-  Superficial dehiscence of the anterior knee incision, without evidence of deep arthrotomy opening.  The leg is swollen to the ankle with some ecchymosis as well.  +SILT throughout distally sp/dp./sur/saph, motor intact distally TA/GSC/FHL/EHL,  1+ DP  pulse  Imagine- Xrays obtained which demonstrate stable hardware with no loosening or fractures.  No air in the joint  Assessment: Right knee wound dehiscence  Plan: -to OR for I and D with closure -will plan for 23 hours obs with iv abx and pain control -will dc on ppx abx and continue her eliquis for dvt ppx post op -NPO -risk and benefits discussed with patient and her family in [place], MD Cell [phone]    04/12/2016 1:05 PM

## 2016-04-12 NOTE — Anesthesia Procedure Notes (Addendum)
Procedure Name: Intubation Date/Time: 04/12/2016 2:44 PM Performed by: Lissa Morales Pre-anesthesia Checklist: Patient identified, Emergency Drugs available, Suction available and Patient being monitored Patient Re-evaluated:Patient Re-evaluated prior to inductionOxygen Delivery Method: Circle system utilized Preoxygenation: Pre-oxygenation with 100% oxygen Intubation Type: IV induction Ventilation: Mask ventilation without difficulty Laryngoscope Size: Mac and 3 Grade View: Grade III Tube type: Oral Tube size: 7.0 mm Number of attempts: 1 Airway Equipment and Method: Stylet and Oral airway Placement Confirmation: ETT inserted through vocal cords under direct vision,  positive ETCO2 and breath sounds checked- equal and bilateral Secured at: 21 cm Tube secured with: Tape Dental Injury: Teeth and Oropharynx as per pre-operative assessment  Difficulty Due To: Difficulty was anticipated and Difficult Airway- due to anterior larynx

## 2016-04-12 NOTE — Anesthesia Preprocedure Evaluation (Signed)
Anesthesia Evaluation  Patient identified by MRN, date of birth, ID band  Reviewed: Allergy & Precautions, NPO status , Patient's Chart, lab work & pertinent test results  History of Anesthesia Complications (+) PONV  Airway Mallampati: I  TM Distance: >3 FB Neck ROM: Full    Dental   Pulmonary    Pulmonary exam normal        Cardiovascular Normal cardiovascular exam     Neuro/Psych Anxiety    GI/Hepatic   Endo/Other    Renal/GU      Musculoskeletal   Abdominal   Peds  Hematology   Anesthesia Other Findings   Reproductive/Obstetrics                             Anesthesia Physical Anesthesia Plan  ASA: II  Anesthesia Plan: General   Post-op Pain Management:    Induction: Intravenous  Airway Management Planned: Oral ETT  Additional Equipment:   Intra-op Plan:   Post-operative Plan: Extubation in OR  Informed Consent: I have reviewed the patients History and Physical, chart, labs and discussed the procedure including the risks, benefits and alternatives for the proposed anesthesia with the patient or authorized representative who has indicated his/her understanding and acceptance.     Plan Discussed with: CRNA and Surgeon  Anesthesia Plan Comments:         Anesthesia Quick Evaluation

## 2016-04-12 NOTE — Transfer of Care (Signed)
Immediate Anesthesia Transfer of Care Note  Patient: [name]  Procedure(s) Performed: Procedure(s): IRRIGATION AND DEBRIDEMENT KNEE (Right)  Patient Location: PACU  Anesthesia Type:General  Level of Consciousness: awake, alert , oriented and patient cooperative  Airway & Oxygen Therapy: Patient Spontanous Breathing and Patient connected to face mask oxygen  Post-op Assessment: Report given to RN, Post -op Vital signs reviewed and stable and Patient moving all extremities X 4  Post vital signs: stable  Last Vitals:  Vitals:   04/12/16 1136  BP: 140/70  Pulse: 105  Resp: 18  Temp: 37.1 C    Last Pain:  Vitals:   04/12/16 1159  TempSrc:   PainSc: 0-No pain         Complications: No apparent anesthesia complications

## 2016-04-12 NOTE — ED Notes (Signed)
OR called and stated they will arrive to get patient in 20 minutes.  Spoke to [employer], Therapist, sports

## 2016-04-12 NOTE — ED Notes (Signed)
Bed: WA21 Expected date:  Expected time:  Means of arrival:  Comments: Knee incision open

## 2016-04-13 DIAGNOSIS — M1711 Unilateral primary osteoarthritis, right knee: Secondary | ICD-10-CM | POA: Diagnosis not present

## 2016-04-13 DIAGNOSIS — Z96651 Presence of right artificial knee joint: Secondary | ICD-10-CM | POA: Diagnosis not present

## 2016-04-13 DIAGNOSIS — Z85828 Personal history of other malignant neoplasm of skin: Secondary | ICD-10-CM | POA: Diagnosis not present

## 2016-04-13 DIAGNOSIS — Z96652 Presence of left artificial knee joint: Secondary | ICD-10-CM | POA: Diagnosis not present

## 2016-04-13 DIAGNOSIS — Z7901 Long term (current) use of anticoagulants: Secondary | ICD-10-CM | POA: Diagnosis not present

## 2016-04-13 DIAGNOSIS — C7989 Secondary malignant neoplasm of other specified sites: Secondary | ICD-10-CM | POA: Diagnosis not present

## 2016-04-13 DIAGNOSIS — C50912 Malignant neoplasm of unspecified site of left female breast: Secondary | ICD-10-CM | POA: Diagnosis not present

## 2016-04-13 DIAGNOSIS — Z9221 Personal history of antineoplastic chemotherapy: Secondary | ICD-10-CM | POA: Diagnosis not present

## 2016-04-13 DIAGNOSIS — T8131XA Disruption of external operation (surgical) wound, not elsewhere classified, initial encounter: Secondary | ICD-10-CM | POA: Diagnosis not present

## 2016-04-13 LAB — CBC
HCT: 28 % — ABNORMAL LOW (ref 36.0–46.0)
Hemoglobin: 9.2 g/dL — ABNORMAL LOW (ref 12.0–15.0)
MCH: 30.6 pg (ref 26.0–34.0)
MCHC: 32.9 g/dL (ref 30.0–36.0)
MCV: 93 fL (ref 78.0–100.0)
Platelets: 224 10*3/uL (ref 150–400)
RBC: 3.01 MIL/uL — ABNORMAL LOW (ref 3.87–5.11)
RDW: 19.1 % — ABNORMAL HIGH (ref 11.5–15.5)
WBC: 3.5 10*3/uL — ABNORMAL LOW (ref 4.0–10.5)

## 2016-04-13 LAB — PREPARE RBC (CROSSMATCH)

## 2016-04-13 MED ORDER — HEPARIN SOD (PORK) LOCK FLUSH 100 UNIT/ML IV SOLN
500.0000 [IU] | INTRAVENOUS | Status: AC | PRN
  Administered 2016-04-13: 500 [IU]

## 2016-04-13 MED ORDER — CEPHALEXIN 500 MG PO CAPS: 500.0000 mg | ORAL_CAPSULE | Freq: Four times a day (QID) | ORAL | 0 refills | Status: DC

## 2016-04-13 NOTE — Care Management Note (Signed)
Case Management Note  Patient Details  Name: [name] MRN: [id number] Date of Birth: 01-24-1956  Subjective/Objective:    Right knee wound dehiscence, s/p TKA on 03/21/16               Action/Plan: Discharge Planning: AVS reviewed: NCM spoke to pt and states she is going to outpt PT. Has RW and 3n1 at home.    Expected Discharge Date:  04/13/2016                Expected Discharge Plan:  Home/Self Care  In-House Referral:  NA  Discharge planning Services  CM Consult  Post Acute Care Choice:  NA Choice offered to:  NA  DME Arranged:  N/A DME Agency:  NA  HH Arranged:  NA HH Agency:  NA  Status of Service:  Completed, signed off  If discussed at [place] of Stay Meetings, dates discussed:    Additional Comments:  Erenest Rasher, RN 04/13/2016, 11:08 AM

## 2016-04-13 NOTE — Progress Notes (Signed)
Subjective: 1 Day Post-Op Procedure(s) (LRB): IRRIGATION AND DEBRIDEMENT KNEE (Right) Patient reports pain as well controlled.  tolerating care. Family updated. Denies SOB, CP, or calf pain.  Objective: Vital signs in last 24 hours: Temp:  [97.6 F (36.4 C)-98.8 F (37.1 C)] 97.6 F (36.4 C) (11/19 0444) Pulse Rate:  [63-105] 96 (11/19 0444) Resp:  [13-20] 14 (11/19 0444) BP: (102-146)/(51-84) 118/53 (11/19 0444) SpO2:  [95 %-100 %] 95 % (11/19 0444)  Intake/Output from previous day: 11/18 0701 - 11/19 0700 In: 2050 [I.V.:1300; Blood:750] Out: 1025 [Urine:725; Drains:50; Blood:250] Intake/Output this shift: No intake/output data recorded.   Recent Labs  04/12/16 1333 04/12/16 2015 04/13/16 0500  HGB 8.8* 10.3* 9.2*    Recent Labs  04/12/16 1333 04/12/16 2015 04/13/16 0500  WBC 3.9*  --  3.5*  RBC 2.88*  --  3.01*  HCT 27.8* 32.2* 28.0*  PLT 287  --  224    Recent Labs  04/12/16 1333  NA 137  K 3.4*  CL 102  CO2 27  BUN 14  CREATININE 0.49  GLUCOSE 87  CALCIUM 9.0    Recent Labs  04/12/16 1333  INR 1.34    Well nourished. Alert and oriented x3. RRR, Lungs clear, BS x4. Abdomen soft and non tender. Right Calf soft and non tender. Right knee dressing C/D/I. No DVT signs. Compartment soft. No signs of infection.  Right LE grossly neurovascular intact. No skin irritation related to adhesives.  Assessment/Plan: 1 Day Post-Op Procedure(s) (LRB): IRRIGATION AND DEBRIDEMENT KNEE (Right) Hemovac drain D/c by nursing later today knee immobilizerto be worn with weight bearing.  She will keep the incisional VAC in place until her follow up appointment as well.  D/c her home this afternoon On 7 days of oral Keflex and return to see  Dr. Tonita Cong in 7-10 days for wound check.   She did receive 2 units of pRBCs for her chronic anemia, and blood loss during surgery, ahead of her scheduled transfusion on Monday with her hematologist.    [name]  L 04/13/2016, 8:15 AM

## 2016-04-13 NOTE — Progress Notes (Signed)
Discharged from floor via w/c for transport home by car. Daughters & belongings with pt. No changes in assessment. Shandy Checo, [employer]

## 2016-04-14 ENCOUNTER — Encounter (HOSPITAL_COMMUNITY): Payer: Self-pay | Admitting: Orthopedic Surgery

## 2016-04-14 LAB — TYPE AND SCREEN
ABO/RH(D): A POS
ANTIBODY SCREEN: NEGATIVE
UNIT DIVISION: 0
Unit division: 0

## 2016-04-16 ENCOUNTER — Encounter: Payer: Self-pay | Admitting: Physical Therapy

## 2016-04-21 ENCOUNTER — Encounter: Payer: Self-pay | Admitting: Physical Therapy

## 2016-04-21 MED FILL — ZOLPIDEM TARTRATE 5 MG TAB: 5 | 30 days supply | Qty: 30 | Fill #1

## 2016-04-23 ENCOUNTER — Encounter: Payer: Self-pay | Admitting: Physical Therapy

## 2016-04-25 ENCOUNTER — Encounter: Payer: Self-pay | Admitting: Physical Therapy

## 2016-04-25 DIAGNOSIS — C50919 Malignant neoplasm of unspecified site of unspecified female breast: Secondary | ICD-10-CM | POA: Diagnosis not present

## 2016-04-25 DIAGNOSIS — C50912 Malignant neoplasm of unspecified site of left female breast: Secondary | ICD-10-CM | POA: Diagnosis not present

## 2016-04-25 DIAGNOSIS — C7989 Secondary malignant neoplasm of other specified sites: Secondary | ICD-10-CM | POA: Diagnosis not present

## 2016-04-25 DIAGNOSIS — J479 Bronchiectasis, uncomplicated: Secondary | ICD-10-CM | POA: Diagnosis not present

## 2016-04-25 DIAGNOSIS — Z96653 Presence of artificial knee joint, bilateral: Secondary | ICD-10-CM | POA: Diagnosis not present

## 2016-04-25 DIAGNOSIS — K7689 Other specified diseases of liver: Secondary | ICD-10-CM | POA: Diagnosis not present

## 2016-04-25 DIAGNOSIS — C792 Secondary malignant neoplasm of skin: Secondary | ICD-10-CM | POA: Diagnosis not present

## 2016-04-25 DIAGNOSIS — C50412 Malignant neoplasm of upper-outer quadrant of left female breast: Secondary | ICD-10-CM | POA: Diagnosis not present

## 2016-04-25 DIAGNOSIS — Z9013 Acquired absence of bilateral breasts and nipples: Secondary | ICD-10-CM | POA: Diagnosis not present

## 2016-04-28 ENCOUNTER — Encounter: Payer: Self-pay | Admitting: Physical Therapy

## 2016-04-28 DIAGNOSIS — Z923 Personal history of irradiation: Secondary | ICD-10-CM | POA: Diagnosis not present

## 2016-04-28 DIAGNOSIS — Z171 Estrogen receptor negative status [ER-]: Secondary | ICD-10-CM | POA: Diagnosis not present

## 2016-04-28 DIAGNOSIS — C50919 Malignant neoplasm of unspecified site of unspecified female breast: Secondary | ICD-10-CM | POA: Diagnosis not present

## 2016-04-28 DIAGNOSIS — C50412 Malignant neoplasm of upper-outer quadrant of left female breast: Secondary | ICD-10-CM | POA: Diagnosis not present

## 2016-04-28 DIAGNOSIS — Z5112 Encounter for antineoplastic immunotherapy: Secondary | ICD-10-CM | POA: Diagnosis not present

## 2016-04-28 DIAGNOSIS — Z9013 Acquired absence of bilateral breasts and nipples: Secondary | ICD-10-CM | POA: Diagnosis not present

## 2016-04-28 DIAGNOSIS — C792 Secondary malignant neoplasm of skin: Secondary | ICD-10-CM | POA: Diagnosis not present

## 2016-04-29 DIAGNOSIS — Z471 Aftercare following joint replacement surgery: Secondary | ICD-10-CM | POA: Diagnosis not present

## 2016-04-29 DIAGNOSIS — Z96651 Presence of right artificial knee joint: Secondary | ICD-10-CM | POA: Diagnosis not present

## 2016-04-29 NOTE — Discharge Summary (Signed)
Physician Discharge Summary  Patient ID: [name] MRN: [id number] DOB/AGE: June 16, 1955 60 y.o.  Admit date: 04/12/2016 Discharge date: 04/29/2016  Admission Diagnoses: wound dehisence  Discharge Diagnoses:  Active Problems:   Wound dehiscence, surgical   Discharged Condition: good  Hospital Course:  [name] is a 60 y.o. who was admitted to [facility]. They were brought to the operating room on 04/12/2016 and underwent Procedure(s): IRRIGATION AND DEBRIDEMENT KNEE.  Patient tolerated the procedure well and was later transferred to the recovery room and then to the orthopaedic floor for postoperative care.  They were given PO and IV analgesics for pain control following their surgery.  They were given 24 hours of postoperative antibiotics of  Anti-infectives    Start     Dose/Rate Route Frequency Ordered Stop   04/13/16 0600  ceFAZolin (ANCEF) IVPB 2g/100 mL premix  Status:  Discontinued     2 g 200 mL/hr over 30 Minutes Intravenous On call to O.R. 04/12/16 1926 04/12/16 1950   04/13/16 0000  cephALEXin (KEFLEX) 500 MG capsule     500 mg Oral 4 times daily 04/13/16 0821     04/12/16 2200  ceFAZolin (ANCEF) IVPB 2g/100 mL premix     2 g 200 mL/hr over 30 Minutes Intravenous Every 8 hours 04/12/16 1927 04/13/16 [id number]     and started on DVT prophylaxisDischarge planning consulted to help with postop disposition and equipment needs.  Patient had a good night on the evening of surgery and started to get up OOB with therapy on day one.  Hemovac drain was pulled without difficulty.  Dressing was with normal limits.  The patient had progressed with therapy and meeting their goals. Patient was seen in rounds and was ready to go home.  Consults:n/a  Significant Diagnostic Studies: routine  Treatments:routine  Discharge Exam: Blood pressure (!) 118/53, pulse 96, temperature 97.6 F (36.4 C), temperature source Oral, resp. rate 14, last menstrual period 06/22/2007, SpO2 95  %.   Disposition: 01-Home or Self Care  Discharge Instructions    Call MD / Call 911    Complete by:  As directed    If you experience chest pain or shortness of breath, CALL 911 and be transported to the hospital emergency room.  If you develope a fever above 101 F, pus (white drainage) or increased drainage or redness at the wound, or calf pain, call your surgeon's office.   Constipation Prevention    Complete by:  As directed    Drink plenty of fluids.  Prune juice may be helpful.  You may use a stool softener, such as Colace (over the counter) 100 mg twice a day.  Use MiraLax (over the counter) for constipation as needed.   Diet - low sodium heart healthy    Complete by:  As directed    Discharge instructions    Complete by:  As directed    Knee immobilizer to be worn with weight bearing until follow up appointment.  She will keep the incisional VAC in place until her follow up appointment as well.  We will dc her home today afternoon once her abx are complete.  She will return home on 7 days of oral Keflex and return to see Dr. Tonita Cong in 7-10 days for wound check.  She did receive 2 units of pRBCs for her chronic anemia, and blood loss during surgery, ahead of her scheduled transfusion on Monday with her hematologist.  Keep dressing clean and dry.  Increase activity slowly as tolerated    Complete by:  As directed        Medication List    STOP taking these medications   KADCYLA IV     TAKE these medications   apixaban 5 MG Tabs tablet Commonly known as:  ELIQUIS Take 5 mg by mouth 2 (two) times daily. Will stop prior to proceure   celecoxib 200 MG capsule Commonly known as:  CELEBREX TAKE 1 CAPSULE BY MOUTH TWICE DAILY What changed:  See the new instructions.   cephALEXin 500 MG capsule Commonly known as:  KEFLEX Take 1 capsule (500 mg total) by mouth 4 (four) times daily.   darifenacin 15 MG 24 hr tablet Commonly known as:  ENABLEX Take 15 mg by mouth daily.    docusate sodium 100 MG capsule Commonly known as:  COLACE Take 1 capsule (100 mg total) by mouth 2 (two) times daily as needed for mild constipation.   gabapentin 300 MG capsule Commonly known as:  NEURONTIN TAKE 1 CAPSULE BY MOUTH 3 TIMES DAILY   methocarbamol 500 MG tablet Commonly known as:  ROBAXIN Take 1 tablet (500 mg total) by mouth every 6 (six) hours as needed for muscle spasms.   ondansetron 8 MG tablet Commonly known as:  ZOFRAN Take 1 tablet (8 mg total) by mouth every 8 (eight) hours as needed for nausea.   oxyCODONE-acetaminophen 5-325 MG tablet Commonly known as:  PERCOCET Take 1-2 tablets by mouth every 4 (four) hours as needed for severe pain.   polyethylene glycol packet Commonly known as:  MIRALAX / GLYCOLAX Take 17 g by mouth daily.   traMADol 50 MG tablet Commonly known as:  ULTRAM TAKE 1 TABLET BY MOUTH EVERY 6 HOURS AS NEEDED FOR PAIN   Vitamin D3 2000 units Tabs Take 1 tablet by mouth 4 (four) times a week.   zolpidem 5 MG tablet Commonly known as:  AMBIEN TAKE 1 TABLET BY MOUTH AT BEDTIME AS NEEDED FOR SLEEP        Signed: Lajean Manes 04/29/2016, 2:05 PM

## 2016-04-30 ENCOUNTER — Encounter: Payer: Self-pay | Admitting: Physical Therapy

## 2016-04-30 ENCOUNTER — Ambulatory Visit: Payer: 59 | Attending: Specialist | Admitting: Physical Therapy

## 2016-04-30 DIAGNOSIS — M6281 Muscle weakness (generalized): Secondary | ICD-10-CM | POA: Insufficient documentation

## 2016-04-30 DIAGNOSIS — T814XXA Infection following a procedure, initial encounter: Secondary | ICD-10-CM | POA: Diagnosis not present

## 2016-04-30 DIAGNOSIS — M25561 Pain in right knee: Secondary | ICD-10-CM | POA: Insufficient documentation

## 2016-04-30 DIAGNOSIS — M25661 Stiffness of right knee, not elsewhere classified: Secondary | ICD-10-CM | POA: Diagnosis not present

## 2016-04-30 DIAGNOSIS — R600 Localized edema: Secondary | ICD-10-CM | POA: Insufficient documentation

## 2016-04-30 NOTE — Therapy (Signed)
[facility] [address], [place], [place], [address] Phone: [phone]   Fax:  [phone]  Physical Therapy Treatment  Patient Details  Name: [name] MRN: [id number] Date of Birth: 1955/09/30 Referring Provider: Dr. Tonita Cong  Encounter Date: 04/30/2016      PT End of Session - 04/30/16 1149    Visit Number 4   Date for PT Re-Evaluation 06/03/16   Authorization Type UMR    PT Start Time 1145   PT Stop Time 2671   PT Time Calculation (min) 50 min   Activity Tolerance Patient tolerated treatment well  Leg/knee was tired pt reported.    Behavior During Therapy [facility] for tasks assessed/performed      Past Medical History:  Diagnosis Date  . 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 Arthritis KNEES   right knee, hx. past left knee replacemnt.  . Blood clot in vein    around Portacath right chest-tx. Eliquis about a yr., prior Lovenox.  . Chronic anticoagulation HX  PORT-A-CATH CLOT   2010 - HAS BEEN ON LOVENOX SINCE THE CLOT  . Elevated blood pressure reading without diagnosis of hypertension    PT MONITORS HER B/P AT HOME  . Heart murmur   . 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 PONV (postoperative nausea and vomiting)   . Skin cancer of anterior chest BREAST CANCER PRIMARY W/ METS TO CHEST WALL SKIN CANCER--  CHEMOTHERAPY EVERY 3 WEEKS AT [facility]  . Status post skin flap graft    right chest wall with metastatis right anterior chest -no drainage or open wound.    Past Surgical History:  Procedure Laterality Date  . CHEST WALL TUMOR EXCISION    . hemotoma     evacuation left chest wall  . IRRIGATION AND DEBRIDEMENT KNEE Right 04/12/2016   Procedure: IRRIGATION AND DEBRIDEMENT KNEE;  Surgeon: Nicholes Stairs, MD;  Location: [facility];  Service: Orthopedics;  Laterality:  Right;  . KNEE ARTHROSCOPY  02/13/2012   Procedure: ARTHROSCOPY KNEE;  Surgeon: Johnn Hai, MD;  Location: [facility];  Service: Orthopedics;  Laterality: Left;  WITH DEBRIDEMENt   . KNEE ARTHROSCOPY WITH LATERAL MENISECTOMY  02/13/2012   Procedure: KNEE ARTHROSCOPY WITH LATERAL MENISECTOMY;  Surgeon: Johnn Hai, MD;  Location: [place];  Service: Orthopedics;;  partial  . LEFT MODIFIED RADICAL MASTECTOMY/ RIGHT TOTAL MASTECTOMY  11-13-2007   LEFT BREAST CANCER W/ AXILLARY LYMPH NODE METASTASIS AND POST NEOADJUVANT CHEMO  . PLACEMENT PORT-A-CATH  06/22/2007   right chest  . SKIN GRAFT     chest-post tumor removal, second occurrence of cancer" 2015 surgery for Flap 2015"  . TOTAL KNEE ARTHROPLASTY Left 09/23/2012   Procedure: LEFT TOTAL KNEE ARTHROPLASTY;  Surgeon: Johnn Hai, MD;  [facility]: [facility];  Service: Orthopedics;  Laterality: Left;  . TOTAL KNEE ARTHROPLASTY Right 03/20/2016   Procedure: RIGHT TOTAL KNEE ARTHROPLASTY;  Surgeon: Susa Day, MD;  [facility];  Service: Orthopedics;  Laterality: Right;  . TRANSTHORACIC ECHOCARDIOGRAM  11-18-2011  DR BENSIMHON (ECHO EVERY 3 MONTHS)   HX CHEMO INDUCED CARDIOTOXICITY/  LVSF NORMAL/ EF 55-50%/ MILD MITRIAL VALVE REGURG./ MILDLY INCREASED SYSTOLIC PRESSURE OF PULMONARY ARTERIES    There were no vitals filed for this visit.      Subjective  Assessment - 04/30/16 1148    Subjective Got stitches out yesterday. No current pain. Is now allowed to drive only to PT. Remains with significant RTLE edema.   Currently in Pain? No/denies   Multiple Pain Sites No                         [facility] Adult PT Treatment/Exercise - 04/30/16 0001      Knee/Hip Exercises: Stretches   Gastroc Stretch Both;3 reps;10 seconds     Knee/Hip Exercises: Aerobic   Nustep L1 x 10 min     Knee/Hip Exercises: Standing   Forward Step Up Right;2 sets;10 reps;Step Height: 6"   Forward Step Up  Limitations At the stairs so pt could use rails   Rebounder weight shifting 1 min 3 ways  VC to be aware of her weight bearing in LTLE     Knee/Hip Exercises: Seated   Long Arc Quad AROM;3 sets;10 reps  3 second holds     Knee/Hip Exercises: Supine   Short Arc Quad Sets AROM;Strengthening;Right;3 sets;10 reps     Vasopneumatic   Number Minutes Vasopneumatic  15 minutes   Vasopnuematic Location  Knee   Vasopneumatic Pressure Medium   Vasopneumatic Temperature  3 snowflakes                  PT Short Term Goals - 04/30/16 1223      PT SHORT TERM GOAL #1   Title be independent in initial HEP to promote flexion and extension ROM to decrease risk of contracture  05/06/16   Time 4   Period Weeks   Status Achieved     PT SHORT TERM GOAL #3   Title demonstrate Right  knee AROM flexion to 90 degrees needed for greater ease with ascending stairs to bedroom   Time 4   Period Weeks   Status Achieved           PT Long Term Goals - 04/30/16 1224      PT LONG TERM GOAL #4   Title The patient will be able to walk 600 feet for community ambulation    Time 8   Period Weeks   Status On-going     PT LONG TERM GOAL #5   Title report 50% reduction in right knee pain with standing and walking   Time 8   Period Weeks   Status Achieved               Plan - 04/30/16 1149    Clinical Impression Statement Pt got her stitches out yesterday and was also given permission by her MD to drive to PT. Pt continues to be compliant with her HEP and elevates her LE often througout the day. Although edema is still present throughout the RTLE, it is much better than on day one. Very slight clear discharge from a few stitch sites. Added step ups at the stairs today which pt did without pain and compensation.    Rehab Potential Good   Clinical Impairments Affecting Rehab Potential Cancer no U/S ;  sensitive to tapes (post surgical dressing tape caused blisters)   PT Frequency 3x / week    PT Duration 8 weeks   PT Treatment/Interventions ADLs/Self Care Home Management;Cryotherapy;Electrical Stimulation;Functional mobility training;Stair training;Gait training;Moist Heat;Therapeutic activities;Therapeutic exercise;Balance training;Neuromuscular re-education;Patient/family education;Passive range of motion;Manual techniques;Vasopneumatic Device   PT Next Visit Plan Edema management, Knee ROM exs, continue with step ups, add lateral step ups next.  Consulted and Agree with Plan of Care Patient      Patient will benefit from skilled therapeutic intervention in order to improve the following deficits and impairments:  Difficulty walking, Decreased range of motion, Pain, Increased edema, Decreased strength, Decreased mobility, Impaired flexibility, Decreased activity tolerance, Decreased endurance, Hypomobility  Visit Diagnosis: Acute pain of right knee  Stiffness of right knee, not elsewhere classified  Muscle weakness (generalized)  Localized edema     Problem List Patient Active Problem List   Diagnosis Date Noted  . Wound dehiscence, surgical 04/12/2016  . 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 Vitamin D deficiency 01/25/2016  . 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 Fever and chills 02/01/2015  . Primary osteoarthritis of right knee 02/01/2015  . Anemia 02/01/2015  . Frequent UTI 01/12/2014  . Sinus tachycardia 11/03/2013  . DOE (dyspnea on exertion) 11/03/2013  . Postoperative anemia due to acute blood loss 12/24/2012  .    09/23/2012  . Sinusitis 07/28/2012  . Neuropathy due to chemotherapeutic drug ([place]) 07/25/2011  . 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 Breast cancer, left breast ([place]) 11/12/2010  . Chronic anticoagulation 11/12/2010  . Anxiety, generalized 03/26/2008    Reyn Faivre , PTA 04/30/2016, 12:26 PM  [facility] [address] [place], [place], [address] Phone:  [phone]   Fax:  [phone]  Name: KAYLEEN ALIG MRN: [id number] Date of Birth: 02/16/1956

## 2016-05-01 MED FILL — ELIQUIS 5 MG TABLET: 5 | 30 days supply | Qty: 60 | Fill #11

## 2016-05-02 ENCOUNTER — Encounter: Payer: Self-pay | Admitting: Physical Therapy

## 2016-05-02 ENCOUNTER — Ambulatory Visit: Payer: 59 | Admitting: Physical Therapy

## 2016-05-02 DIAGNOSIS — M6281 Muscle weakness (generalized): Secondary | ICD-10-CM

## 2016-05-02 DIAGNOSIS — T814XXA Infection following a procedure, initial encounter: Secondary | ICD-10-CM | POA: Diagnosis not present

## 2016-05-02 DIAGNOSIS — M25561 Pain in right knee: Secondary | ICD-10-CM

## 2016-05-02 DIAGNOSIS — M25661 Stiffness of right knee, not elsewhere classified: Secondary | ICD-10-CM

## 2016-05-02 DIAGNOSIS — R600 Localized edema: Secondary | ICD-10-CM | POA: Diagnosis not present

## 2016-05-02 NOTE — Therapy (Signed)
[facility] [address] [place], [place], [address] Phone: [phone]   Fax:  [phone]  Physical Therapy Treatment  Patient Details  Name: [name] MRN: [id number] Date of Birth: 01-Dec-1955 Referring Provider: Dr. Tonita Cong  Encounter Date: 05/02/2016      PT End of Session - 05/02/16 1022    Visit Number 5   Date for PT Re-Evaluation 06/03/16   Authorization Type UMR    PT Start Time 1016   PT Stop Time 1115   PT Time Calculation (min) 59 min   Activity Tolerance Patient tolerated treatment well   Behavior During Therapy [facility] for tasks assessed/performed      Past Medical History:  Diagnosis Date  . 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 Arthritis KNEES   right knee, hx. past left knee replacemnt.  . Blood clot in vein    around Portacath right chest-tx. Eliquis about a yr., prior Lovenox.  . Chronic anticoagulation HX  PORT-A-CATH CLOT   2010 - HAS BEEN ON LOVENOX SINCE THE CLOT  . Elevated blood pressure reading without diagnosis of hypertension    PT MONITORS HER B/P AT HOME  . Heart murmur   . 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 PONV (postoperative nausea and vomiting)   . Skin cancer of anterior chest BREAST CANCER PRIMARY W/ METS TO CHEST WALL SKIN CANCER--  CHEMOTHERAPY EVERY 3 WEEKS AT [facility]  . Status post skin flap graft    right chest wall with metastatis right anterior chest -no drainage or open wound.    Past Surgical History:  Procedure Laterality Date  . CHEST WALL TUMOR EXCISION    . hemotoma     evacuation left chest wall  . IRRIGATION AND DEBRIDEMENT KNEE Right 04/12/2016   Procedure: IRRIGATION AND DEBRIDEMENT KNEE;  Surgeon: Nicholes Stairs, MD;  [facility];  Service: Orthopedics;  Laterality: Right;  . KNEE ARTHROSCOPY   02/13/2012   Procedure: ARTHROSCOPY KNEE;  Surgeon: Johnn Hai, MD;  Location: [facility];  Service: Orthopedics;  Laterality: Left;  WITH DEBRIDEMENt   . KNEE ARTHROSCOPY WITH LATERAL MENISECTOMY  02/13/2012   Procedure: KNEE ARTHROSCOPY WITH LATERAL MENISECTOMY;  Surgeon: Johnn Hai, MD;  Location: [place];  Service: Orthopedics;;  partial  . LEFT MODIFIED RADICAL MASTECTOMY/ RIGHT TOTAL MASTECTOMY  11-13-2007   LEFT BREAST CANCER W/ AXILLARY LYMPH NODE METASTASIS AND POST NEOADJUVANT CHEMO  . PLACEMENT PORT-A-CATH  06/22/2007   right chest  . SKIN GRAFT     chest-post tumor removal, second occurrence of cancer" 2015 surgery for Flap 2015"  . TOTAL KNEE ARTHROPLASTY Left 09/23/2012   Procedure: LEFT TOTAL KNEE ARTHROPLASTY;  Surgeon: Johnn Hai, MD;  [facility];  Service: Orthopedics;  Laterality: Left;  . TOTAL KNEE ARTHROPLASTY Right 03/20/2016   Procedure: RIGHT TOTAL KNEE ARTHROPLASTY;  Surgeon: Susa Day, MD;  [facility];  Service: Orthopedics;  Laterality: Right;  . TRANSTHORACIC ECHOCARDIOGRAM  11-18-2011  DR BENSIMHON (ECHO EVERY 3 MONTHS)   HX CHEMO INDUCED CARDIOTOXICITY/  LVSF NORMAL/ EF 55-50%/ MILD MITRIAL VALVE REGURG./ MILDLY INCREASED SYSTOLIC PRESSURE OF PULMONARY ARTERIES    There were no vitals filed for this visit.      Subjective Assessment - 05/02/16 1023  Subjective Felt fine doing the stairs last time. remains with RTLE edema. Felt some fatigue yesterday from chemo Monday.    Currently in Pain? No/denies   Multiple Pain Sites No            [facility] PT Assessment - 05/02/16 0001      AROM   Right Knee Extension 12   Right Knee Flexion 101  supine position                     [facility] Adult PT Treatment/Exercise - 05/02/16 0001      Knee/Hip Exercises: Aerobic   Nustep L2 x 10 min  Review of status with all Nustep     Knee/Hip Exercises: Standing   Lateral Step Up Right;2  sets;10 reps;Hand Hold: 1;Step Height: 6"   Forward Step Up Right;2 sets;10 reps;Step Height: 6"   Forward Step Up Limitations At the stairs so pt could use rails   Rebounder weight shifting 1 min 3 ways  VC to be aware of her weight bearing in LTLE     Knee/Hip Exercises: Seated   Long Arc Quad AROM;3 sets;10 reps  3 second holds   Long [employer] Weight 2 lbs.     Knee/Hip Exercises: Supine   Short Arc Quad Sets AROM;Strengthening;Right;3 sets;10 reps   Short Arc Quad Sets Limitations 2# added   Heel Slides AROM;Right;1 set;10 reps     Vasopneumatic   Number Minutes Vasopneumatic  15 minutes   Vasopnuematic Location  Knee   Vasopneumatic Pressure Medium   Vasopneumatic Temperature  3 snowflakes     Manual Therapy   Manual Therapy Soft tissue mobilization   Soft tissue mobilization Scar mobilization                   PT Short Term Goals - 04/30/16 1223      PT SHORT TERM GOAL #1   Title be independent in initial HEP to promote flexion and extension ROM to decrease risk of contracture  05/06/16   Time 4   Period Weeks   Status Achieved     PT SHORT TERM GOAL #3   Title demonstrate Right  knee AROM flexion to 90 degrees needed for greater ease with ascending stairs to bedroom   Time 4   Period Weeks   Status Achieved           PT Long Term Goals - 05/02/16 1102      PT LONG TERM GOAL #6   Title improve right  knee AROM flexion to > or = to 100 degrees and extension to 5 degrees to improve squatting, rising from a chair and descending stairs   Time 8   Period Weeks   Status Partially Met  Met for flexion, not extension               Plan - 05/02/16 1024    Clinical Impression Statement Pt tolerating weight bearing exercises without pain, edema looked much improved today versus Wednesday despite still having it throughout the LE.  Pt's knee AROM significnatly improved since eval in both directions.    Rehab Potential Good   Clinical Impairments  Affecting Rehab Potential Cancer no U/S ;  sensitive to tapes (post surgical dressing tape caused blisters)   PT Frequency 3x / week   PT Duration 8 weeks   PT Treatment/Interventions ADLs/Self Care Home Management;Cryotherapy;Electrical Stimulation;Functional mobility training;Stair training;Gait training;Moist Heat;Therapeutic activities;Therapeutic exercise;Balance training;Neuromuscular re-education;Patient/family education;Passive range of motion;Manual techniques;Vasopneumatic  Device   PT Next Visit Plan Continue with knee ROM and quad strengthening exercises: scar mobs, patella mobs, Game ready for edema   Consulted and Agree with Plan of Care Patient      Patient will benefit from skilled therapeutic intervention in order to improve the following deficits and impairments:  Difficulty walking, Decreased range of motion, Pain, Increased edema, Decreased strength, Decreased mobility, Impaired flexibility, Decreased activity tolerance, Decreased endurance, Hypomobility  Visit Diagnosis: Acute pain of right knee  Stiffness of right knee, not elsewhere classified  Muscle weakness (generalized)  Localized edema     Problem List Patient Active Problem List   Diagnosis Date Noted  . Wound dehiscence, surgical 04/12/2016  . 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 Vitamin D deficiency 01/25/2016  . 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 Fever and chills 02/01/2015  . Primary osteoarthritis of right knee 02/01/2015  . Anemia 02/01/2015  . Frequent UTI 01/12/2014  . Sinus tachycardia 11/03/2013  . DOE (dyspnea on exertion) 11/03/2013  . Postoperative anemia due to acute blood loss 12/24/2012  .    09/23/2012  . Sinusitis 07/28/2012  . Neuropathy due to chemotherapeutic drug ([place]) 07/25/2011  . 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 Breast cancer, left breast ([place]) 11/12/2010  . Chronic anticoagulation 11/12/2010  . Anxiety, generalized 03/26/2008    COCHRAN,JENNIFER,  PTA 05/02/2016, 11:03 AM  [facility] [address], [place], [place], [address] Phone: [phone]   Fax:  [phone]  Name: [name] MRN: [id number] Date of Birth: 24-Mar-1956

## 2016-05-05 ENCOUNTER — Ambulatory Visit: Payer: 59 | Admitting: Physical Therapy

## 2016-05-05 ENCOUNTER — Encounter: Payer: Self-pay | Admitting: Physical Therapy

## 2016-05-05 DIAGNOSIS — R600 Localized edema: Secondary | ICD-10-CM

## 2016-05-05 DIAGNOSIS — M25661 Stiffness of right knee, not elsewhere classified: Secondary | ICD-10-CM | POA: Diagnosis not present

## 2016-05-05 DIAGNOSIS — M25561 Pain in right knee: Secondary | ICD-10-CM | POA: Diagnosis not present

## 2016-05-05 DIAGNOSIS — M6281 Muscle weakness (generalized): Secondary | ICD-10-CM

## 2016-05-05 DIAGNOSIS — T814XXA Infection following a procedure, initial encounter: Secondary | ICD-10-CM | POA: Diagnosis not present

## 2016-05-05 MED FILL — GABAPENTIN 300 MG CAPSULE: 300 | 30 days supply | Qty: 90 | Fill #2

## 2016-05-05 MED FILL — DARIFENACIN ER 15 MG TABLET: 15 | 30 days supply | Qty: 30 | Fill #5

## 2016-05-05 NOTE — Therapy (Signed)
[facility] [address], [place], [place], [address] Phone: [phone]   Fax:  [phone]  Physical Therapy Treatment  Patient Details  Name: [name] MRN: [id number] Date of Birth: 1956-04-06 Referring Provider: Dr. Tonita Cong  Encounter Date: 05/05/2016      PT End of Session - 05/05/16 1236    Visit Number 6   Date for PT Re-Evaluation 06/03/16   Authorization Type UMR    PT Start Time 1232   PT Stop Time 1322   PT Time Calculation (min) 50 min   Activity Tolerance Patient tolerated treatment well   Behavior During Therapy [facility] for tasks assessed/performed      Past Medical History:  Diagnosis Date  . 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 Arthritis KNEES   right knee, hx. past left knee replacemnt.  . Blood clot in vein    around Portacath right chest-tx. Eliquis about a yr., prior Lovenox.  . Chronic anticoagulation HX  PORT-A-CATH CLOT   2010 - HAS BEEN ON LOVENOX SINCE THE CLOT  . Elevated blood pressure reading without diagnosis of hypertension    PT MONITORS HER B/P AT HOME  . Heart murmur   . 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 PONV (postoperative nausea and vomiting)   . Skin cancer of anterior chest BREAST CANCER PRIMARY W/ METS TO CHEST WALL SKIN CANCER--  CHEMOTHERAPY EVERY 3 WEEKS AT [facility]  . Status post skin flap graft    right chest wall with metastatis right anterior chest -no drainage or open wound.    Past Surgical History:  Procedure Laterality Date  . CHEST WALL TUMOR EXCISION    . hemotoma     evacuation left chest wall  . IRRIGATION AND DEBRIDEMENT KNEE Right 04/12/2016   Procedure: IRRIGATION AND DEBRIDEMENT KNEE;  Surgeon: Nicholes Stairs, MD;  [facility];  Service: Orthopedics;  Laterality: Right;  . KNEE ARTHROSCOPY   02/13/2012   Procedure: ARTHROSCOPY KNEE;  Surgeon: Johnn Hai, MD;  Location: [facility];  Service: Orthopedics;  Laterality: Left;  WITH DEBRIDEMENt   . KNEE ARTHROSCOPY WITH LATERAL MENISECTOMY  02/13/2012   Procedure: KNEE ARTHROSCOPY WITH LATERAL MENISECTOMY;  Surgeon: Johnn Hai, MD;  Location: [place];  Service: Orthopedics;;  partial  . LEFT MODIFIED RADICAL MASTECTOMY/ RIGHT TOTAL MASTECTOMY  11-13-2007   LEFT BREAST CANCER W/ AXILLARY LYMPH NODE METASTASIS AND POST NEOADJUVANT CHEMO  . PLACEMENT PORT-A-CATH  06/22/2007   right chest  . SKIN GRAFT     chest-post tumor removal, second occurrence of cancer" 2015 surgery for Flap 2015"  . TOTAL KNEE ARTHROPLASTY Left 09/23/2012   Procedure: LEFT TOTAL KNEE ARTHROPLASTY;  Surgeon: Johnn Hai, MD;  [facility];  Service: Orthopedics;  Laterality: Left;  . TOTAL KNEE ARTHROPLASTY Right 03/20/2016   Procedure: RIGHT TOTAL KNEE ARTHROPLASTY;  Surgeon: Susa Day, MD;  [facility];  Service: Orthopedics;  Laterality: Right;  . TRANSTHORACIC ECHOCARDIOGRAM  11-18-2011  DR BENSIMHON (ECHO EVERY 3 MONTHS)   HX CHEMO INDUCED CARDIOTOXICITY/  LVSF NORMAL/ EF 55-50%/ MILD MITRIAL VALVE REGURG./ MILDLY INCREASED SYSTOLIC PRESSURE OF PULMONARY ARTERIES    There were no vitals filed for this visit.      Subjective Assessment - 05/05/16 1234  Subjective The weather over the weekend made my knees ache. It helped when I would change positions, and has subsided. The weights were no trouble last session.    Currently in Pain? No/denies   Pain Location Knee   Pain Orientation Right   Pain Descriptors / Indicators Tightness   Multiple Pain Sites No                         [facility] Adult PT Treatment/Exercise - 05/05/16 0001      Knee/Hip Exercises: Aerobic   Nustep L2 x 10 min  Review of status with all Nustep     Knee/Hip Exercises: Standing   Lateral Step Up Right;3  sets;10 reps;Hand Hold: 1;Step Height: 6"   Forward Step Up Right;3 sets;10 reps;Step Height: 6"   Forward Step Up Limitations At the stairs so pt could use rails   Rebounder weight shifting 1 min 3 ways  VC to be aware of her weight bearing in LTLE     Knee/Hip Exercises: Seated   Long Arc Quad AROM;3 sets;10 reps  3 second holds   Long [employer] Weight 2 lbs.     Knee/Hip Exercises: Supine   Short Arc Quad Sets AROM;Strengthening;Right;3 sets;10 reps   Short Arc Quad Sets Limitations 2#   Heel Slides AROM;Right;1 set;10 reps     Vasopneumatic   Number Minutes Vasopneumatic  15 minutes   Vasopnuematic Location  Knee   Vasopneumatic Pressure Medium   Vasopneumatic Temperature  3 snowflakes     Manual Therapy   Manual Therapy Soft tissue mobilization   Soft tissue mobilization Scar mobilization, patella mobs                  PT Short Term Goals - 04/30/16 1223      PT SHORT TERM GOAL #1   Title be independent in initial HEP to promote flexion and extension ROM to decrease risk of contracture  05/06/16   Time 4   Period Weeks   Status Achieved     PT SHORT TERM GOAL #3   Title demonstrate Right  knee AROM flexion to 90 degrees needed for greater ease with ascending stairs to bedroom   Time 4   Period Weeks   Status Achieved           PT Long Term Goals - 05/02/16 1102      PT LONG TERM GOAL #6   Title improve right  knee AROM flexion to > or = to 100 degrees and extension to 5 degrees to improve squatting, rising from a chair and descending stairs   Time 8   Period Weeks   Status Partially Met  Met for flexion, not extension               Plan - 05/05/16 1236    Clinical Impression Statement Pt continues to do well in all areas. Swelling continues to reduce, pt can exercises against reistance for quad strength, all without reducing pain.    Rehab Potential Good   Clinical Impairments Affecting Rehab Potential Cancer no U/S ;  sensitive to  tapes (post surgical dressing tape caused blisters)   PT Frequency 3x / week   PT Duration 8 weeks   PT Treatment/Interventions ADLs/Self Care Home Management;Cryotherapy;Electrical Stimulation;Functional mobility training;Stair training;Gait training;Moist Heat;Therapeutic activities;Therapeutic exercise;Balance training;Neuromuscular re-education;Patient/family education;Passive range of motion;Manual techniques;Vasopneumatic Device   PT Next Visit Plan Measure edema next visit. Continue with quad strength, look at  hip strength in standing, pt may need hip HEP.   Consulted and Agree with Plan of Care --      Patient will benefit from skilled therapeutic intervention in order to improve the following deficits and impairments:  Difficulty walking, Decreased range of motion, Pain, Increased edema, Decreased strength, Decreased mobility, Impaired flexibility, Decreased activity tolerance, Decreased endurance, Hypomobility  Visit Diagnosis: Acute pain of right knee  Stiffness of right knee, not elsewhere classified  Muscle weakness (generalized)  Localized edema     Problem List Patient Active Problem List   Diagnosis Date Noted  . Wound dehiscence, surgical 04/12/2016  . 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 Vitamin D deficiency 01/25/2016  . 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 Fever and chills 02/01/2015  . Primary osteoarthritis of right knee 02/01/2015  . Anemia 02/01/2015  . Frequent UTI 01/12/2014  . Sinus tachycardia 11/03/2013  . DOE (dyspnea on exertion) 11/03/2013  . Postoperative anemia due to acute blood loss 12/24/2012  .    09/23/2012  . Sinusitis 07/28/2012  . Neuropathy due to chemotherapeutic drug ([place]) 07/25/2011  . 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 Breast cancer, left breast ([place]) 11/12/2010  . Chronic anticoagulation 11/12/2010  . Anxiety, generalized 03/26/2008    Odai Wimmer, PTA 05/05/2016, 1:10 PM  [facility] [address] [place], [place], [address] Phone: [phone]   Fax:  [phone]  Name: [name] MRN: [id number] Date of Birth: 07-07-1955

## 2016-05-07 ENCOUNTER — Encounter: Payer: Self-pay | Admitting: Physical Therapy

## 2016-05-07 ENCOUNTER — Ambulatory Visit: Payer: 59 | Admitting: Physical Therapy

## 2016-05-07 DIAGNOSIS — M6281 Muscle weakness (generalized): Secondary | ICD-10-CM

## 2016-05-07 DIAGNOSIS — M25561 Pain in right knee: Secondary | ICD-10-CM | POA: Diagnosis not present

## 2016-05-07 DIAGNOSIS — R600 Localized edema: Secondary | ICD-10-CM | POA: Diagnosis not present

## 2016-05-07 DIAGNOSIS — T814XXA Infection following a procedure, initial encounter: Secondary | ICD-10-CM | POA: Diagnosis not present

## 2016-05-07 DIAGNOSIS — M25661 Stiffness of right knee, not elsewhere classified: Secondary | ICD-10-CM | POA: Diagnosis not present

## 2016-05-07 NOTE — Therapy (Signed)
[facility] [address], [place], [place], [address] Phone: [phone]   Fax:  [phone]  Physical Therapy Treatment  Patient Details  Name: [name] MRN: [id number] Date of Birth: 1955/07/22 Referring Provider: Dr. Tonita Cong  Encounter Date: 05/07/2016      PT End of Session - 05/07/16 1239    Visit Number 7   Date for PT Re-Evaluation 06/03/16   Authorization Type UMR    PT Start Time 1225   PT Stop Time 1316   PT Time Calculation (min) 51 min   Activity Tolerance Patient tolerated treatment well   Behavior During Therapy [facility] for tasks assessed/performed      Past Medical History:  Diagnosis Date  . 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 Arthritis KNEES   right knee, hx. past left knee replacemnt.  . Blood clot in vein    around Portacath right chest-tx. Eliquis about a yr., prior Lovenox.  . Chronic anticoagulation HX  PORT-A-CATH CLOT   2010 - HAS BEEN ON LOVENOX SINCE THE CLOT  . Elevated blood pressure reading without diagnosis of hypertension    PT MONITORS HER B/P AT HOME  . Heart murmur   . 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 PONV (postoperative nausea and vomiting)   . Skin cancer of anterior chest BREAST CANCER PRIMARY W/ METS TO CHEST WALL SKIN CANCER--  CHEMOTHERAPY EVERY 3 WEEKS AT [facility]  . Status post skin flap graft    right chest wall with metastatis right anterior chest -no drainage or open wound.    Past Surgical History:  Procedure Laterality Date  . CHEST WALL TUMOR EXCISION    . hemotoma     evacuation left chest wall  . IRRIGATION AND DEBRIDEMENT KNEE Right 04/12/2016   Procedure: IRRIGATION AND DEBRIDEMENT KNEE;  Surgeon: Nicholes Stairs, MD;  [facility];  Service: Orthopedics;  Laterality: Right;  . KNEE ARTHROSCOPY   02/13/2012   Procedure: ARTHROSCOPY KNEE;  Surgeon: Johnn Hai, MD;  Location: [facility];  Service: Orthopedics;  Laterality: Left;  WITH DEBRIDEMENt   . KNEE ARTHROSCOPY WITH LATERAL MENISECTOMY  02/13/2012   Procedure: KNEE ARTHROSCOPY WITH LATERAL MENISECTOMY;  Surgeon: Johnn Hai, MD;  Location: [place];  Service: Orthopedics;;  partial  . LEFT MODIFIED RADICAL MASTECTOMY/ RIGHT TOTAL MASTECTOMY  11-13-2007   LEFT BREAST CANCER W/ AXILLARY LYMPH NODE METASTASIS AND POST NEOADJUVANT CHEMO  . PLACEMENT PORT-A-CATH  06/22/2007   right chest  . SKIN GRAFT     chest-post tumor removal, second occurrence of cancer" 2015 surgery for Flap 2015"  . TOTAL KNEE ARTHROPLASTY Left 09/23/2012   Procedure: LEFT TOTAL KNEE ARTHROPLASTY;  Surgeon: Johnn Hai, MD;  [facility];  Service: Orthopedics;  Laterality: Left;  . TOTAL KNEE ARTHROPLASTY Right 03/20/2016   Procedure: RIGHT TOTAL KNEE ARTHROPLASTY;  Surgeon: Susa Day, MD;  [facility];  Service: Orthopedics;  Laterality: Right;  . TRANSTHORACIC ECHOCARDIOGRAM  11-18-2011  DR BENSIMHON (ECHO EVERY 3 MONTHS)   HX CHEMO INDUCED CARDIOTOXICITY/  LVSF NORMAL/ EF 55-50%/ MILD MITRIAL VALVE REGURG./ MILDLY INCREASED SYSTOLIC PRESSURE OF PULMONARY ARTERIES    There were no vitals filed for this visit.      Subjective Assessment - 05/07/16 1239  Subjective No complaints this AM. Pt is pleased with the progress of her swelling.    Currently in Pain? No/denies   Pain Location Knee   Pain Orientation Right   Pain Descriptors / Indicators Tightness   Multiple Pain Sites No            [facility] PT Assessment - 05/07/16 0001      Circumferential Edema   Circumferential - Right RT: mid patella 45.5cm: 10 cm above 47 cm: 10 cm below patella 41 cm     AROM   Right Knee Flexion 106                     [facility] Adult PT Treatment/Exercise - 05/07/16 0001      Knee/Hip  Exercises: Aerobic   Nustep L2 x 10 min  Review of status with all Nustep     Knee/Hip Exercises: Standing   Hip Abduction Stengthening;Both;2 sets;10 reps;Knee straight   Lateral Step Up Right;3 sets;10 reps;Hand Hold: 1;Step Height: 6"   Forward Step Up Right;3 sets;10 reps;Step Height: 6"   Forward Step Up Limitations At the stairs so pt could use rails   Rebounder weight shifting 1 min 3 ways  VC to be aware of her weight bearing in LTLE     Knee/Hip Exercises: Seated   Long Arc Quad AROM;3 sets;10 reps  3 second holds   Long [employer] Weight 2 lbs.     Knee/Hip Exercises: Supine   Short Arc Quad Sets Strengthening;Right;2 sets;10 reps   Short Arc Quad Sets Limitations 3#     Vasopneumatic   Number Minutes Vasopneumatic  15 minutes   Vasopnuematic Location  Knee   Vasopneumatic Pressure Medium   Vasopneumatic Temperature  3 snowflakes                  PT Short Term Goals - 05/07/16 1251      PT SHORT TERM GOAL #2   Title reduced right LE edema: mid patella < 2.5 cm difference with left and  mid calf < 2.5  cm difference with left needed to promote healing, improved ROM and muscle activation   Time 4   Period Weeks   Status Partially Met  patella circumference met, did not measure calf today           PT Long Term Goals - 05/02/16 1102      PT LONG TERM GOAL #6   Title improve right  knee AROM flexion to > or = to 100 degrees and extension to 5 degrees to improve squatting, rising from a chair and descending stairs   Time 8   Period Weeks   Status Partially Met  Met for flexion, not extension               Plan - 05/07/16 1240    Clinical Impression Statement Pt's circumferential measurments at the patella were significantly improved partially meeting goal. Did not measure at the calf. Pt reports squatting down to take dishes out of the dishwasher is significnatly better and "I can feel my quads grab." Added standing hip strength today, pt  reports she is going to do this at home.    Rehab Potential Good   Clinical Impairments Affecting Rehab Potential Cancer no U/S ;  sensitive to tapes (post surgical dressing tape caused blisters)   PT Duration 8 weeks   PT Treatment/Interventions ADLs/Self Care Home Management;Cryotherapy;Electrical Stimulation;Functional mobility training;Stair training;Gait training;Moist Heat;Therapeutic activities;Therapeutic exercise;Balance training;Neuromuscular re-education;Patient/family  education;Passive range of motion;Manual techniques;Vasopneumatic Device   PT Next Visit Plan Add single leg stance next   Consulted and Agree with Plan of Care --      Patient will benefit from skilled therapeutic intervention in order to improve the following deficits and impairments:  Difficulty walking, Decreased range of motion, Pain, Increased edema, Decreased strength, Decreased mobility, Impaired flexibility, Decreased activity tolerance, Decreased endurance, Hypomobility  Visit Diagnosis: Acute pain of right knee  Stiffness of right knee, not elsewhere classified  Muscle weakness (generalized)     Problem List Patient Active Problem List   Diagnosis Date Noted  . Wound dehiscence, surgical 04/12/2016  . 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 Vitamin D deficiency 01/25/2016  . 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 Fever and chills 02/01/2015  . Primary osteoarthritis of right knee 02/01/2015  . Anemia 02/01/2015  . Frequent UTI 01/12/2014  . Sinus tachycardia 11/03/2013  . DOE (dyspnea on exertion) 11/03/2013  . Postoperative anemia due to acute blood loss 12/24/2012  .    09/23/2012  . Sinusitis 07/28/2012  . Neuropathy due to chemotherapeutic drug ([place]) 07/25/2011  . 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 Breast cancer, left breast ([place]) 11/12/2010  . Chronic anticoagulation 11/12/2010  . Anxiety, generalized 03/26/2008    COCHRAN,JENNIFER, PTA 05/07/2016, 1:07 PM  [facility] [address] [place], [place], [address] Phone: [phone]   Fax:  [phone]  Name: BETH GOODLIN MRN: [id number] Date of Birth: Sep 29, 1955

## 2016-05-09 ENCOUNTER — Encounter: Payer: Self-pay | Admitting: Physical Therapy

## 2016-05-09 ENCOUNTER — Ambulatory Visit: Payer: 59 | Admitting: Physical Therapy

## 2016-05-09 DIAGNOSIS — M25661 Stiffness of right knee, not elsewhere classified: Secondary | ICD-10-CM

## 2016-05-09 DIAGNOSIS — R600 Localized edema: Secondary | ICD-10-CM | POA: Diagnosis not present

## 2016-05-09 DIAGNOSIS — T814XXA Infection following a procedure, initial encounter: Secondary | ICD-10-CM | POA: Diagnosis not present

## 2016-05-09 DIAGNOSIS — M6281 Muscle weakness (generalized): Secondary | ICD-10-CM

## 2016-05-09 DIAGNOSIS — M25561 Pain in right knee: Secondary | ICD-10-CM

## 2016-05-09 NOTE — Therapy (Addendum)
[facility] [address] [place], [place], [address] Phone: [phone]   Fax:  [phone]  Physical Therapy Treatment  Patient Details  Name: [name] MRN: [id number] Date of Birth: 1956/02/27 Referring Provider: Dr. Tonita Cong  Encounter Date: 05/09/2016      PT End of Session - 05/09/16 1102    Visit Number 8   Date for PT Re-Evaluation 06/03/16   Authorization Type UMR    PT Start Time 1045   PT Stop Time 1145   PT Time Calculation (min) 60 min   Activity Tolerance Patient tolerated treatment well   Behavior During Therapy [facility] for tasks assessed/performed      Past Medical History:  Diagnosis Date  . 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 Arthritis KNEES   right knee, hx. past left knee replacemnt.  . Blood clot in vein    around Portacath right chest-tx. Eliquis about a yr., prior Lovenox.  . Chronic anticoagulation HX  PORT-A-CATH CLOT   2010 - HAS BEEN ON LOVENOX SINCE THE CLOT  . Elevated blood pressure reading without diagnosis of hypertension    PT MONITORS HER B/P AT HOME  . Heart murmur   . 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 PONV (postoperative nausea and vomiting)   . Skin cancer of anterior chest BREAST CANCER PRIMARY W/ METS TO CHEST WALL SKIN CANCER--  CHEMOTHERAPY EVERY 3 WEEKS AT [facility]  . Status post skin flap graft    right chest wall with metastatis right anterior chest -no drainage or open wound.    Past Surgical History:  Procedure Laterality Date  . CHEST WALL TUMOR EXCISION    . hemotoma     evacuation left chest wall  . IRRIGATION AND DEBRIDEMENT KNEE Right 04/12/2016   Procedure: IRRIGATION AND DEBRIDEMENT KNEE;  Surgeon: Nicholes Stairs, MD;  [facility];  Service: Orthopedics;  Laterality: Right;  . KNEE ARTHROSCOPY   02/13/2012   Procedure: ARTHROSCOPY KNEE;  Surgeon: Johnn Hai, MD;  Location: [facility];  Service: Orthopedics;  Laterality: Left;  WITH DEBRIDEMENt   . KNEE ARTHROSCOPY WITH LATERAL MENISECTOMY  02/13/2012   Procedure: KNEE ARTHROSCOPY WITH LATERAL MENISECTOMY;  Surgeon: Johnn Hai, MD;  Location: [place];  Service: Orthopedics;;  partial  . LEFT MODIFIED RADICAL MASTECTOMY/ RIGHT TOTAL MASTECTOMY  11-13-2007   LEFT BREAST CANCER W/ AXILLARY LYMPH NODE METASTASIS AND POST NEOADJUVANT CHEMO  . PLACEMENT PORT-A-CATH  06/22/2007   right chest  . SKIN GRAFT     chest-post tumor removal, second occurrence of cancer" 2015 surgery for Flap 2015"  . TOTAL KNEE ARTHROPLASTY Left 09/23/2012   Procedure: LEFT TOTAL KNEE ARTHROPLASTY;  Surgeon: Johnn Hai, MD;  [facility];  Service: Orthopedics;  Laterality: Left;  . TOTAL KNEE ARTHROPLASTY Right 03/20/2016   Procedure: RIGHT TOTAL KNEE ARTHROPLASTY;  Surgeon: Susa Day, MD;  [facility];  Service: Orthopedics;  Laterality: Right;  . TRANSTHORACIC ECHOCARDIOGRAM  11-18-2011  DR BENSIMHON (ECHO EVERY 3 MONTHS)   HX CHEMO INDUCED CARDIOTOXICITY/  LVSF NORMAL/ EF 55-50%/ MILD MITRIAL VALVE REGURG./ MILDLY INCREASED SYSTOLIC PRESSURE OF PULMONARY ARTERIES    There were no vitals filed for this visit.      Subjective Assessment - 05/09/16 1103  Subjective Had some medial knee pain upon waking this AM, but it resoleved as she moved around.   Currently in Pain? No/denies   Multiple Pain Sites No                         [facility] Adult PT Treatment/Exercise - 05/09/16 0001      Knee/Hip Exercises: Aerobic   Nustep L2 x 10 min  Review of status with all Nustep     Knee/Hip Exercises: Standing   Hip Abduction AROM;Stengthening;Both;3 sets;10 reps   Lateral Step Up Right;3 sets;10 reps;Hand Hold: 1;Step Height: 6"   Forward Step Up Right;3 sets;10 reps;Step Height: 6"    Forward Step Up Limitations At the stairs so pt could use rails   SLS 3x 15 seconds;  few fingers to asst for balance     Knee/Hip Exercises: Seated   Long Arc Quad AROM;3 sets;10 reps  3 second holds   Long [employer] Weight 2 lbs.   Sit to Sand 2 sets;10 reps;without UE support  sitting on blue [employer]     Knee/Hip Exercises: Supine   Short Arc Quad Sets AROM;Strengthening;Right;3 sets;10 reps   Short Arc Quad Sets Limitations 3#     Vasopneumatic   Number Minutes Vasopneumatic  15 minutes   Vasopnuematic Location  Knee   Vasopneumatic Pressure Medium   Vasopneumatic Temperature  3 snowflakes     Manual Therapy   Soft tissue mobilization Medial knee tissue                  PT Short Term Goals - 05/07/16 1251      PT SHORT TERM GOAL #2   Title reduced right LE edema: mid patella < 2.5 cm difference with left and  mid calf < 2.5  cm difference with left needed to promote healing, improved ROM and muscle activation   Time 4   Period Weeks   Status Partially Met  patella circumference met, did not measure calf today           PT Long Term Goals - 05/02/16 1102      PT LONG TERM GOAL #6   Title improve right  knee AROM flexion to > or = to 100 degrees and extension to 5 degrees to improve squatting, rising from a chair and descending stairs   Time 8   Period Weeks   Status Partially Met  Met for flexion, not extension               Plan - 05/09/16 1134    Clinical Impression Statement Increased pt's exercise load today to include more closed chain exercises. Pt could perform sit to stand witout any UE assistance and excellent use of the quads.  Tissues around the patella  are restricted, but pt is also quite swollen still. All measurements this week improved. Pain not limiting.       Patient will benefit from skilled therapeutic intervention in order to improve the following deficits and impairments:  Difficulty walking, Decreased range of motion,  Pain, Increased edema, Decreased strength, Decreased mobility, Impaired flexibility, Decreased activity tolerance, Decreased endurance, Hypomobility  Visit Diagnosis: Acute pain of right knee  Stiffness of right knee, not elsewhere classified  Muscle weakness (generalized)  Localized edema     Problem List Patient Active Problem List   Diagnosis Date Noted  . Wound dehiscence, surgical 04/12/2016  . 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 Vitamin D deficiency 01/25/2016  .  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 Fever and chills 02/01/2015  . Primary osteoarthritis of right knee 02/01/2015  . Anemia 02/01/2015  . Frequent UTI 01/12/2014  . Sinus tachycardia 11/03/2013  . DOE (dyspnea on exertion) 11/03/2013  . Postoperative anemia due to acute blood loss 12/24/2012  .    09/23/2012  . Sinusitis 07/28/2012  . Neuropathy due to chemotherapeutic drug ([place]) 07/25/2011  . 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 Breast cancer, left breast ([place]) 11/12/2010  . Chronic anticoagulation 11/12/2010  . Anxiety, generalized 03/26/2008    [name], PTA 05/09/2016, 11:37 AM PHYSICAL THERAPY DISCHARGE SUMMARY  Visits from Start of Care: 8  Current functional level related to goals / functional outcomes: Pt had infection in her knee and had to have surgical procedure.  Pt will be discharged due to change in status.    Remaining deficits: See above for most recent status.     Education / Equipment: HEP Plan: Patient agrees to discharge.  Patient goals were not met. Patient is being discharged due to a change in medical status.  ?????      Sigurd Sos, PT 06/10/16 9:14 AM   [facility] [address], [place], [place], [address] Phone: [phone]   Fax:  [phone]  Name: [name] MRN: [id number] Date of Birth: 06-Feb-1956

## 2016-05-10 ENCOUNTER — Encounter (HOSPITAL_COMMUNITY): Payer: Self-pay | Admitting: Emergency Medicine

## 2016-05-10 ENCOUNTER — Inpatient Hospital Stay (HOSPITAL_COMMUNITY)
Admission: EM | Admit: 2016-05-10 | Discharge: 2016-05-12 | DRG: 862 | Disposition: A | Discharge status: Home / Self Care | Payer: 59 | Attending: Internal Medicine | Admitting: Internal Medicine

## 2016-05-10 ENCOUNTER — Emergency Department (HOSPITAL_COMMUNITY): Payer: 59

## 2016-05-10 DIAGNOSIS — Z7901 Long term (current) use of anticoagulants: Secondary | ICD-10-CM | POA: Diagnosis not present

## 2016-05-10 DIAGNOSIS — T451X5A Adverse effect of antineoplastic and immunosuppressive drugs, initial encounter: Secondary | ICD-10-CM | POA: Diagnosis present

## 2016-05-10 DIAGNOSIS — C50912 Malignant neoplasm of unspecified site of left female breast: Secondary | ICD-10-CM | POA: Diagnosis present

## 2016-05-10 DIAGNOSIS — Z85828 Personal history of other malignant neoplasm of skin: Secondary | ICD-10-CM | POA: Diagnosis not present

## 2016-05-10 DIAGNOSIS — I8290 Acute embolism and thrombosis of unspecified vein: Secondary | ICD-10-CM | POA: Diagnosis present

## 2016-05-10 DIAGNOSIS — L03115 Cellulitis of right lower limb: Secondary | ICD-10-CM | POA: Diagnosis not present

## 2016-05-10 DIAGNOSIS — Z8249 Family history of ischemic heart disease and other diseases of the circulatory system: Secondary | ICD-10-CM | POA: Diagnosis not present

## 2016-05-10 DIAGNOSIS — I951 Orthostatic hypotension: Secondary | ICD-10-CM

## 2016-05-10 DIAGNOSIS — T814XXA Infection following a procedure, initial encounter: Principal | ICD-10-CM | POA: Diagnosis present

## 2016-05-10 DIAGNOSIS — T148XXA Other injury of unspecified body region, initial encounter: Secondary | ICD-10-CM

## 2016-05-10 DIAGNOSIS — Z880 Allergy status to penicillin: Secondary | ICD-10-CM

## 2016-05-10 DIAGNOSIS — N390 Urinary tract infection, site not specified: Secondary | ICD-10-CM | POA: Diagnosis present

## 2016-05-10 DIAGNOSIS — C50919 Malignant neoplasm of unspecified site of unspecified female breast: Secondary | ICD-10-CM | POA: Diagnosis present

## 2016-05-10 DIAGNOSIS — Z7982 Long term (current) use of aspirin: Secondary | ICD-10-CM | POA: Diagnosis not present

## 2016-05-10 DIAGNOSIS — A419 Sepsis, unspecified organism: Secondary | ICD-10-CM | POA: Diagnosis present

## 2016-05-10 DIAGNOSIS — Z79891 Long term (current) use of opiate analgesic: Secondary | ICD-10-CM

## 2016-05-10 DIAGNOSIS — Z853 Personal history of malignant neoplasm of breast: Secondary | ICD-10-CM

## 2016-05-10 DIAGNOSIS — Z888 Allergy status to other drugs, medicaments and biological substances status: Secondary | ICD-10-CM

## 2016-05-10 DIAGNOSIS — C773 Secondary and unspecified malignant neoplasm of axilla and upper limb lymph nodes: Secondary | ICD-10-CM | POA: Diagnosis present

## 2016-05-10 DIAGNOSIS — R509 Fever, unspecified: Secondary | ICD-10-CM | POA: Diagnosis not present

## 2016-05-10 DIAGNOSIS — Y838 Other surgical procedures as the cause of abnormal reaction of the patient, or of later complication, without mention of misadventure at the time of the procedure: Secondary | ICD-10-CM | POA: Diagnosis present

## 2016-05-10 DIAGNOSIS — Z79899 Other long term (current) drug therapy: Secondary | ICD-10-CM

## 2016-05-10 DIAGNOSIS — C7989 Secondary malignant neoplasm of other specified sites: Secondary | ICD-10-CM | POA: Diagnosis present

## 2016-05-10 DIAGNOSIS — L089 Local infection of the skin and subcutaneous tissue, unspecified: Secondary | ICD-10-CM | POA: Insufficient documentation

## 2016-05-10 DIAGNOSIS — M009 Pyogenic arthritis, unspecified: Secondary | ICD-10-CM | POA: Diagnosis not present

## 2016-05-10 DIAGNOSIS — Z86718 Personal history of other venous thrombosis and embolism: Secondary | ICD-10-CM | POA: Diagnosis not present

## 2016-05-10 DIAGNOSIS — Z96653 Presence of artificial knee joint, bilateral: Secondary | ICD-10-CM | POA: Diagnosis present

## 2016-05-10 DIAGNOSIS — Z96651 Presence of right artificial knee joint: Secondary | ICD-10-CM | POA: Diagnosis not present

## 2016-05-10 LAB — COMPREHENSIVE METABOLIC PANEL
ALBUMIN: 3.7 g/dL (ref 3.5–5.0)
ALK PHOS: 138 U/L — AB (ref 38–126)
ALT: 57 U/L — AB (ref 14–54)
AST: 101 U/L — AB (ref 15–41)
Anion gap: 11 (ref 5–15)
BUN: 20 mg/dL (ref 6–20)
CALCIUM: 8.7 mg/dL — AB (ref 8.9–10.3)
CHLORIDE: 98 mmol/L — AB (ref 101–111)
CO2: 23 mmol/L (ref 22–32)
CREATININE: 1 mg/dL (ref 0.44–1.00)
GFR calc Af Amer: >60 mL/min (ref >60)
GFR calc non Af Amer: >60 mL/min (ref >60)
Glucose, Bld: 99 mg/dL (ref 65–99)
Potassium: 3.9 mmol/L (ref 3.5–5.1)
SODIUM: 132 mmol/L — AB (ref 135–145)
Total Bilirubin: 1.7 mg/dL — ABNORMAL HIGH (ref 0.3–1.2)
Total Protein: 7.4 g/dL (ref 6.5–8.1)

## 2016-05-10 LAB — URINALYSIS, ROUTINE W REFLEX MICROSCOPIC
BILIRUBIN URINE: NEGATIVE
GLUCOSE, UA: NEGATIVE mg/dL
HGB URINE DIPSTICK: NEGATIVE
KETONES UR: NEGATIVE mg/dL
NITRITE: NEGATIVE
PH: 5 (ref 5.0–8.0)
PROTEIN: NEGATIVE mg/dL
Specific Gravity, Urine: 1.008 (ref 1.005–1.030)

## 2016-05-10 LAB — CBC WITH DIFFERENTIAL/PLATELET
BASOS ABS: 0 10*3/uL (ref 0.0–0.1)
BASOS PCT: 0 %
EOS ABS: 0 10*3/uL (ref 0.0–0.7)
Eosinophils Relative: 0 %
HEMATOCRIT: 35.7 % — AB (ref 36.0–46.0)
HEMOGLOBIN: 11.8 g/dL — AB (ref 12.0–15.0)
LYMPHS PCT: 4 %
Lymphs Abs: 0.6 10*3/uL — ABNORMAL LOW (ref 0.7–4.0)
MCH: 30.3 pg (ref 26.0–34.0)
MCHC: 33.1 g/dL (ref 30.0–36.0)
MCV: 91.5 fL (ref 78.0–100.0)
MONOS PCT: 7 %
Monocytes Absolute: 1 10*3/uL (ref 0.1–1.0)
NEUTROS ABS: 13.1 10*3/uL — AB (ref 1.7–7.7)
NEUTROS PCT: 89 %
Platelets: 114 10*3/uL — ABNORMAL LOW (ref 150–400)
RBC: 3.9 MIL/uL (ref 3.87–5.11)
RDW: 16.5 % — ABNORMAL HIGH (ref 11.5–15.5)
WBC: 14.7 10*3/uL — ABNORMAL HIGH (ref 4.0–10.5)

## 2016-05-10 LAB — LACTIC ACID, PLASMA: Lactic Acid, Venous: 1.2 mmol/L (ref 0.5–1.9)

## 2016-05-10 LAB — I-STAT BETA HCG BLOOD, ED (MC, WL, AP ONLY): I-stat hCG, quantitative: <5 m[IU]/mL (ref <5)

## 2016-05-10 LAB — PROTIME-INR
INR: 1.32
PROTHROMBIN TIME: 16.5 s — AB (ref 11.4–15.2)

## 2016-05-10 LAB — INFLUENZA PANEL BY PCR (TYPE A & B)
Influenza A By PCR: NEGATIVE
Influenza B By PCR: NEGATIVE

## 2016-05-10 LAB — I-STAT CG4 LACTIC ACID, ED: Lactic Acid, Venous: 1.53 mmol/L (ref 0.5–1.9)

## 2016-05-10 MED ORDER — DARIFENACIN HYDROBROMIDE ER 15 MG PO TB24
15.0000 mg | ORAL_TABLET | Freq: Every day | ORAL | Status: DC
  Administered 2016-05-11 – 2016-05-12 (×2): 15 mg via ORAL
  Filled 2016-05-10 (×2): qty 1

## 2016-05-10 MED ORDER — ACETAMINOPHEN 325 MG PO TABS
650.0000 mg | ORAL_TABLET | Freq: Four times a day (QID) | ORAL | Status: DC | PRN
  Administered 2016-05-11: 650 mg via ORAL
  Filled 2016-05-10: qty 2

## 2016-05-10 MED ORDER — SODIUM CHLORIDE 0.9% FLUSH: 3.0000 mL | Freq: Two times a day (BID) | INTRAVENOUS | Status: DC

## 2016-05-10 MED ORDER — VANCOMYCIN HCL 10 G IV SOLR
1250.0000 mg | INTRAVENOUS | Status: DC
  Administered 2016-05-11: 1250 mg via INTRAVENOUS
  Filled 2016-05-10 (×2): qty 1250

## 2016-05-10 MED ORDER — SODIUM CHLORIDE 0.9 % IV BOLUS (SEPSIS)
1000.0000 mL | Freq: Once | INTRAVENOUS | Status: AC
  Administered 2016-05-10: 1000 mL via INTRAVENOUS

## 2016-05-10 MED ORDER — ACETAMINOPHEN 650 MG RE SUPP: 650.0000 mg | Freq: Four times a day (QID) | RECTAL | Status: DC | PRN

## 2016-05-10 MED ORDER — HEPARIN (PORCINE) IN NACL 100-0.45 UNIT/ML-% IJ SOLN
1000.0000 [IU]/h | INTRAMUSCULAR | Status: DC
  Administered 2016-05-11: 1000 [IU]/h via INTRAVENOUS
  Filled 2016-05-10: qty 250

## 2016-05-10 MED ORDER — ONDANSETRON HCL 4 MG/2ML IJ SOLN: 4.0000 mg | Freq: Three times a day (TID) | INTRAMUSCULAR | Status: DC | PRN

## 2016-05-10 MED ORDER — ZOLPIDEM TARTRATE 5 MG PO TABS
5.0000 mg | ORAL_TABLET | Freq: Every evening | ORAL | Status: DC | PRN
  Administered 2016-05-11: 5 mg via ORAL
  Filled 2016-05-10: qty 1

## 2016-05-10 MED ORDER — SODIUM CHLORIDE 0.9 % IV SOLN
INTRAVENOUS | Status: DC
  Administered 2016-05-11 (×3): via INTRAVENOUS

## 2016-05-10 MED ORDER — DOCUSATE SODIUM 100 MG PO CAPS: 100.0000 mg | ORAL_CAPSULE | Freq: Two times a day (BID) | ORAL | Status: DC | PRN

## 2016-05-10 MED ORDER — OXYCODONE-ACETAMINOPHEN 5-325 MG PO TABS: 1.0000 | ORAL_TABLET | ORAL | Status: DC | PRN

## 2016-05-10 MED ORDER — VANCOMYCIN HCL IN DEXTROSE 1-5 GM/200ML-% IV SOLN
1000.0000 mg | Freq: Once | INTRAVENOUS | Status: AC
  Administered 2016-05-10: 1000 mg via INTRAVENOUS
  Filled 2016-05-10: qty 200

## 2016-05-10 MED ORDER — AZTREONAM 2 G IJ SOLR
2.0000 g | Freq: Three times a day (TID) | INTRAMUSCULAR | Status: DC
  Administered 2016-05-11 – 2016-05-12 (×5): 2 g via INTRAVENOUS
  Filled 2016-05-10 (×6): qty 2

## 2016-05-10 MED ORDER — POLYETHYLENE GLYCOL 3350 17 G PO PACK
17.0000 g | PACK | Freq: Every day | ORAL | Status: DC
  Filled 2016-05-10 (×2): qty 1

## 2016-05-10 MED ORDER — GABAPENTIN 300 MG PO CAPS
300.0000 mg | ORAL_CAPSULE | Freq: Three times a day (TID) | ORAL | Status: DC
  Administered 2016-05-10 – 2016-05-12 (×5): 300 mg via ORAL
  Filled 2016-05-10 (×5): qty 1

## 2016-05-10 MED ORDER — CELECOXIB 200 MG PO CAPS
200.0000 mg | ORAL_CAPSULE | Freq: Two times a day (BID) | ORAL | Status: DC
  Administered 2016-05-11: 200 mg via ORAL
  Filled 2016-05-10 (×4): qty 1

## 2016-05-10 MED ORDER — VITAMIN D 1000 UNITS PO TABS
1000.0000 [IU] | ORAL_TABLET | ORAL | Status: DC
  Administered 2016-05-11: 1000 [IU] via ORAL
  Filled 2016-05-10: qty 1

## 2016-05-10 NOTE — Progress Notes (Signed)
ANTICOAGULATION CONSULT NOTE - Initial Consult  Pharmacy Consult for IV heparin Indication: Port-a-cath clot  Allergies  Allergen Reactions  . Tape Hives    Can tolerate paper tape  . Other Rash    STERI STRIPS - Blisters  . Penicillins Rash    Denies airway involvement Has patient had a PCN reaction causing immediate rash, facial/tongue/throat swelling, SOB or lightheadedness with hypotension: No Has patient had a PCN reaction causing severe rash involving mucus membranes or skin necrosis: No Has patient had a PCN reaction that required hospitalization No Has patient had a PCN reaction occurring within the last 10 years: No If all of the above answers are "NO", then may proceed with Cephalosporin use.      Patient Measurements: Height: _0  (170.2 cm) Weight: 155 lb (70.3 kg) IBW/kg (Calculated) : 61.6 Heparin Dosing Weight: 70.3 kg  Vital Signs: Temp: 99 F (37.2 C) (12/16 2029) Temp Source: Oral (12/16 2029) BP: 101/56 (12/16 2130) Pulse Rate: 95 (12/16 2130)  Labs:  Recent Labs  05/10/16 1850  HGB 11.8*  HCT 35.7*  PLT 114*  LABPROT 16.5*  INR 1.32  CREATININE 1.00    Estimated Creatinine Clearance: 58.9 mL/min (by C-G formula based on SCr of 1 mg/dL).   Medical History: Past Medical History:  Diagnosis Date  . 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 Arthritis KNEES   right knee, hx. past left knee replacemnt.  . Blood clot in vein    around Portacath right chest-tx. Eliquis about a yr., prior Lovenox.  . Chronic anticoagulation HX  PORT-A-CATH CLOT   2010 - HAS BEEN ON LOVENOX SINCE THE CLOT  . Elevated blood pressure reading without diagnosis of hypertension    PT MONITORS HER B/P AT HOME  . Heart murmur   . 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 PONV (postoperative nausea and vomiting)   . Skin cancer of anterior  chest BREAST CANCER PRIMARY W/ METS TO CHEST WALL SKIN CANCER--  CHEMOTHERAPY EVERY 3 WEEKS AT [facility]  . Status post skin flap graft    right chest wall with metastatis right anterior chest -no drainage or open wound.    Medications:  Scheduled:  . celecoxib  200 mg Oral BID  . darifenacin  15 mg Oral Daily  . gabapentin  300 mg Oral TID  . polyethylene glycol  17 g Oral Daily  . sodium chloride flush  3 mL Intravenous Q12H  . [START ON 05/11/2016] Vitamin D3  1 tablet Oral Once per day on Sun Tue Thu Sat   Infusions:  . sodium chloride    . aztreonam    . [START ON 05/11/2016] vancomycin      Assessment: 50 yoF admitted with cellulitis on eliquis PTA for hx of port-a-cath clot.  MD now wants to transition to IV heparin in case pt requires a procedure while inpt.  IV heparin per Rx.   12/16 -Will check baseline HL as will likely be elevated d/t eliquis- HL=1.6 and aptt=54 -Will initially follow aPtt until HL and aPtt correlate then only HLs  Goal of Therapy:  aPtt = 66-102 Heparin level 0.3-0.7 units/ml Monitor platelets by anticoagulation protocol: Yes    Plan:  Baseline HL and aPtt now Heparin drip at 1000  Units/hr  Daily HL/CBC/aPtt Will follow aPtts until correlate with HL  Lawana Pai R 05/10/2016,10:05 PM

## 2016-05-10 NOTE — H&P (Signed)
History and Physical    [name] PXT:[id number] DOB: 1955-11-20 DOA: 05/10/2016  Referring MD/NP/PA:   PCP: Annye Asa, MD   Patient coming from:  The patient is coming from home.  At baseline, pt is independent for most of ADL.   Chief Complaint: Fever, chills, right knee infection, increased urinary frequency,  HPI: [name] is a 60 y.o. female with medical history significant of breast cancer (s/p of bilateral mastectomy, on chemotherapy in [place] currently), blood clot in the vein of Port-A-Cath (on Eliquis), who presents with fever, chills, right knee infection and increased urinary frequency.  Patient states that he is having fever, chills, body aches today. He had mild right maxillary sinus pressure and mild runny nose which has resolved. No sore throat. She had nausea and vomited once. No abdominal pain or diarrhea. Patient denies chest pain, cough, shortness of breath. Patient states that she had a knee replacement in October, which was complicated by wound dehiscence required second surgery. She continues to have a small amount of drainage from the wound. He left the knee is swelling, erythematous and mild warm. She has minimal pain in that knee. She was seen in urgent care and referred to the emergency Department due to orthostasis. Patient states she is getting lightheaded when she is standing up. She also has increased urinary frequency, but no dysuria or burning on urination.  ED Course: pt was found to have WBC 14.7, lactate is 1.53, and 1.32, posterior urinalysis with more GLUCOSE, sodium 132, creatinine normal, negative chest x-ray, temperature 99.8, tachycardia, tachypnea, oxygen saturation 93% on room air. Blood pressures are soft and 93/52. Patient is admitted to tele bed as inpatient. Orthopedic surgeon, Dr. Veverly Fells was consulted by EDP.  Review of Systems:   General: has fevers, chills, no changes in body weight, has poor appetite, has fatigue HEENT: no blurry  vision, hearing changes or sore throat Respiratory: no dyspnea, coughing, wheezing CV: no chest pain, no palpitations GI: has nausea, vomiting, no abdominal pain, diarrhea, constipation GU: no dysuria, burning on urination, has increased urinary frequency, no hematuria  Ext: has right knee swelling  Neuro: no unilateral weakness, numbness, or tingling, no vision change or hearing loss Skin: no rash, no skin tear. MSK: No muscle spasm, no deformity Heme: No easy bruising.  Travel history: No recent long distant travel.  Allergy:  Allergies  Allergen Reactions  . Tape Hives    Can tolerate paper tape  . Other Rash    STERI STRIPS - Blisters  . Penicillins Rash    Denies airway involvement Has patient had a PCN reaction causing immediate rash, facial/tongue/throat swelling, SOB or lightheadedness with hypotension: No Has patient had a PCN reaction causing severe rash involving mucus membranes or skin necrosis: No Has patient had a PCN reaction that required hospitalization No Has patient had a PCN reaction occurring within the last 10 years: No If all of the above answers are "NO", then may proceed with Cephalosporin use.      Past Medical History:  Diagnosis Date  . 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 Arthritis KNEES   right knee, hx. past left knee replacemnt.  . Blood clot in vein    around Portacath right chest-tx. Eliquis about a yr., prior Lovenox.  . Chronic anticoagulation HX  PORT-A-CATH CLOT   2010 - HAS BEEN ON LOVENOX SINCE THE CLOT  . Elevated blood pressure reading without diagnosis of hypertension    PT MONITORS HER  B/P AT HOME  . Heart murmur   . 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 PONV (postoperative nausea and vomiting)   . Skin cancer of anterior chest BREAST CANCER PRIMARY W/ METS TO CHEST WALL SKIN CANCER--   CHEMOTHERAPY EVERY 3 WEEKS AT [facility]  . Status post skin flap graft    right chest wall with metastatis right anterior chest -no drainage or open wound.    Past Surgical History:  Procedure Laterality Date  . CHEST WALL TUMOR EXCISION    . hemotoma     evacuation left chest wall  . IRRIGATION AND DEBRIDEMENT KNEE Right 04/12/2016   Procedure: IRRIGATION AND DEBRIDEMENT KNEE;  Surgeon: Nicholes Stairs, MD;  Location: [facility];  Service: Orthopedics;  Laterality: Right;  . KNEE ARTHROSCOPY  02/13/2012   Procedure: ARTHROSCOPY KNEE;  Surgeon: Johnn Hai, MD;  Location: [facility];  Service: Orthopedics;  Laterality: Left;  WITH DEBRIDEMENt   . KNEE ARTHROSCOPY WITH LATERAL MENISECTOMY  02/13/2012   Procedure: KNEE ARTHROSCOPY WITH LATERAL MENISECTOMY;  Surgeon: Johnn Hai, MD;  Location: [place];  Service: Orthopedics;;  partial  . LEFT MODIFIED RADICAL MASTECTOMY/ RIGHT TOTAL MASTECTOMY  11-13-2007   LEFT BREAST CANCER W/ AXILLARY LYMPH NODE METASTASIS AND POST NEOADJUVANT CHEMO  . PLACEMENT PORT-A-CATH  06/22/2007   right chest  . SKIN GRAFT     chest-post tumor removal, second occurrence of cancer" 2015 surgery for Flap 2015"  . TOTAL KNEE ARTHROPLASTY Left 09/23/2012   Procedure: LEFT TOTAL KNEE ARTHROPLASTY;  Surgeon: Johnn Hai, MD;  Location: [facility];  Service: Orthopedics;  Laterality: Left;  . TOTAL KNEE ARTHROPLASTY Right 03/20/2016   Procedure: RIGHT TOTAL KNEE ARTHROPLASTY;  Surgeon: Susa Day, MD;  Location: [facility];  Service: Orthopedics;  Laterality: Right;  . TRANSTHORACIC ECHOCARDIOGRAM  11-18-2011  DR BENSIMHON (ECHO EVERY 3 MONTHS)   HX CHEMO INDUCED CARDIOTOXICITY/  LVSF NORMAL/ EF 55-50%/ MILD MITRIAL VALVE REGURG./ MILDLY INCREASED SYSTOLIC PRESSURE OF PULMONARY ARTERIES    Social History:  reports that she has never smoked. She has never used smokeless tobacco. She reports that she does not drink alcohol or  use drugs.  Family History:  Family History  Problem Relation Age of Onset  . Heart disease Mother     due to mitral valve regurgiation,   . Cancer Mother     breast  . Parkinsonism Father   . Alcohol abuse Paternal Grandfather      Prior to Admission medications   Medication Sig Start Date End Date Taking? Authorizing Provider  apixaban (ELIQUIS) 5 MG TABS tablet Take 5 mg by mouth 2 (two) times daily. Will stop prior to proceure 09/10/15  Yes Historical Provider, MD  celecoxib (CELEBREX) 200 MG capsule TAKE 1 CAPSULE BY MOUTH TWICE DAILY Patient taking differently: 1 Capsule by mouth once daily 04/02/16  Yes Midge Minium, MD  Cholecalciferol (VITAMIN D3) 2000 UNITS TABS Take 1 tablet by mouth 4 (four) times a week.    Yes Historical Provider, MD  darifenacin (ENABLEX) 15 MG 24 hr tablet Take 15 mg by mouth daily.   Yes Historical Provider, MD  docusate sodium (COLACE) 100 MG capsule Take 1 capsule (100 mg total) by mouth 2 (two) times daily as needed for mild constipation. 03/20/16  Yes Susa Day, MD  gabapentin (NEURONTIN) 300 MG capsule TAKE 1 CAPSULE BY MOUTH 3 TIMES DAILY 02/11/16  Yes Midge Minium, MD  ondansetron (ZOFRAN) 8 MG tablet Take 1 tablet (8 mg total) by mouth every 8 (eight) hours as needed for nausea. 06/19/11  Yes Zenia Resides, MD  polyethylene glycol (MIRALAX / GLYCOLAX) packet Take 17 g by mouth daily. 03/20/16  Yes Susa Day, MD  zolpidem (AMBIEN) 5 MG tablet TAKE 1 TABLET BY MOUTH AT BEDTIME AS NEEDED FOR SLEEP 03/17/16  Yes Midge Minium, MD  cephALEXin (KEFLEX) 500 MG capsule Take 1 capsule (500 mg total) by mouth 4 (four) times daily. Patient not taking: Reported on 05/10/2016 04/13/16   Bryson L Stilwell, PA-C  methocarbamol (ROBAXIN) 500 MG tablet Take 1 tablet (500 mg total) by mouth every 6 (six) hours as needed for muscle spasms. Patient not taking: Reported on 05/10/2016 03/20/16   Susa Day, MD  oxyCODONE-acetaminophen  (PERCOCET) 5-325 MG tablet Take 1-2 tablets by mouth every 4 (four) hours as needed for severe pain. Patient not taking: Reported on 05/10/2016 03/20/16   Susa Day, MD  traMADol (ULTRAM) 50 MG tablet TAKE 1 TABLET BY MOUTH EVERY 6 HOURS AS NEEDED FOR PAIN Patient not taking: Reported on 04/12/2016 01/09/15   Doe-Hyun Kyra Searles, DO    Physical Exam: Vitals:   05/10/16 1946 05/10/16 2029 05/10/16 2123 05/10/16 2130  BP: (!) 94/47 (!) 100/52 (!) 104/53 101/56  Pulse: 106 100 96 95  Resp: 19 23 18 20   Temp:  99 F (37.2 C)    TempSrc:  Oral    SpO2: 94% 96% 96% 94%  Weight:      Height:       General: Not in acute distress HEENT:       Eyes: PERRL, EOMI, no scleral icterus.       ENT: No discharge from the ears and nose, no pharynx injection, no tonsillar enlargement.        Neck: No JVD, no bruit, no mass felt. Heme: No neck lymph node enlargement. Cardiac: S1/S2, RRR, No murmurs, No gallops or rubs. Respiratory: No rales, wheezing, rhonchi or rubs. GI: Soft, nondistended, nontender, no rebound pain, no organomegaly, BS present. GU: No hematuria Ext:  2+DP/PT pulse bilaterally. Musculoskeletal:  right knee is swelling, erythematous, and warm. Has little serosanguineous draining from 2 small wounds. Skin: No rashes.  Neuro: Alert, oriented X3, cranial nerves II-XII grossly intact, moves all extremities normally. Psych: Patient is not psychotic, no suicidal or hemocidal ideation.  Labs on Admission: I have personally reviewed following labs and imaging studies  CBC:  Recent Labs Lab 05/10/16 1850  WBC 14.7*  NEUTROABS 13.1*  HGB 11.8*  HCT 35.7*  MCV 91.5  PLT 093*   Basic Metabolic Panel:  Recent Labs Lab 05/10/16 1850  NA 132*  K 3.9  CL 98*  CO2 23  GLUCOSE 99  BUN 20  CREATININE 1.00  CALCIUM 8.7*   GFR: Estimated Creatinine Clearance: 58.9 mL/min (by C-G formula based on SCr of 1 mg/dL). Liver Function Tests:  Recent Labs Lab 05/10/16 1850  AST  101*  ALT 57*  ALKPHOS 138*  BILITOT 1.7*  PROT 7.4  ALBUMIN 3.7   No results for input(s): LIPASE, AMYLASE in the last 168 hours. No results for input(s): AMMONIA in the last 168 hours. Coagulation Profile:  Recent Labs Lab 05/10/16 1850  INR 1.32   Cardiac Enzymes: No results for input(s): CKTOTAL, CKMB, CKMBINDEX, TROPONINI in the last  168 hours. BNP (last 3 results) No results for input(s): PROBNP in the last 8760 hours. HbA1C: No results for input(s): HGBA1C in the last 72 hours. CBG: No results for input(s): GLUCAP in the last 168 hours. Lipid Profile: No results for input(s): CHOL, HDL, LDLCALC, TRIG, CHOLHDL, LDLDIRECT in the last 72 hours. Thyroid Function Tests: No results for input(s): TSH, T4TOTAL, FREET4, T3FREE, THYROIDAB in the last 72 hours. Anemia Panel: No results for input(s): VITAMINB12, FOLATE, FERRITIN, TIBC, IRON, RETICCTPCT in the last 72 hours. Urine analysis:    Component Value Date/Time   COLORURINE YELLOW 05/10/2016 2056   APPEARANCEUR HAZY (A) 05/10/2016 2056   LABSPEC 1.008 05/10/2016 2056   LABSPEC 1.005 02/17/2008 1122   PHURINE 5.0 05/10/2016 2056   GLUCOSEU NEGATIVE 05/10/2016 2056   HGBUR NEGATIVE 05/10/2016 2056   HGBUR trace-intact 11/22/2010 0835   BILIRUBINUR NEGATIVE 05/10/2016 2056   BILIRUBINUR negative 02/25/2012 0859   BILIRUBINUR n 09/19/2011 1505   BILIRUBINUR Negative 02/17/2008 [place] 05/10/2016 2056   PROTEINUR NEGATIVE 05/10/2016 2056   UROBILINOGEN 1.0 02/01/2015 1654   NITRITE NEGATIVE 05/10/2016 2056   LEUKOCYTESUR MODERATE (A) 05/10/2016 2056   LEUKOCYTESUR Moderate 02/17/2008 1122   Sepsis Labs: @LABRCNTIP (procalcitonin:4,lacticidven:4) )No results found for this or any previous visit (from the past 240 hour(s)).   Radiological Exams on Admission: Dg Chest 2 View  Result Date: 05/10/2016 CLINICAL DATA:  Fever.  History of breast cancer. EXAM: CHEST  2 VIEW COMPARISON:  February 01, 2015 FINDINGS: A right Port-A-Cath is stable. No pneumothorax. The cardiomediastinal silhouette is stable. The left hilum is retracted superiorly but this is a stable finding. No pulmonary nodules or masses. No acute interval changes. IMPRESSION: No active cardiopulmonary disease. Electronically Signed   By: Dorise Bullion III M.D   On: 05/10/2016 18:53     EKG: Not done in ED, will get one.   Assessment/Plan Principal Problem:   Infection of right knee (HCC) Active Problems:   Breast cancer, left breast (HCC)   Chronic anticoagulation   Sepsis ([place])   Blood clot in vein   UTI (urinary tract infection)   Cellulitis of right knee   Infection of right knee and sepsis: pt seems to have cellulitis around right knee, cannot completely rule out septic joint. Patient does not have significant pain. Patient has sepsis with leukocytosis, fever, tachycardia and tachypnea. Lactic acid is normal.Blood pressures soft. Hemodynamically stable currently. Patient is immunosuppressed due to chemotherapy, needs for broad antibiotic coverage. Orthopedic surgeon was consulted by EDP.  - will admit to tele bed as inpt - Empiric antimicrobial treatment with vancomycin and aztreonam per pharmacy - PRN Zofran for nausea, and Percocet for pain - Blood cultures x 2  - ESR and CRP - wound care consult - will get Procalcitonin and trend lactic acid levels per sepsis protocol. - IVF: 2.0 L of NS bolus in ED, followed by 125 cc/h - f/u ortho recommendations  UTI: Patient has increased urinary frequency, and positive urinalysis, consistent with UTI. -on Abx as above -f/u Ux  Blood clot in vein: -switch Eliquis to IV heparin in case pt needs procedure  Breast cancer: S/p of chemotherapy and bilateral mastectomy. Patient has been followed up by oncology, Dr. Philipp Ovens in [place]. She is getting every 3 week chemotherapy, last dose was 2 weeks ago. -Follow-up with Dr. Philipp Ovens  DVT ppx: On IV Heparin    Code  Status: Full code Family Communication: Yes, patient's  Daughter  at  bed side Disposition Plan:  Anticipate discharge back to previous home environment Consults called:  Orthopedic surgeon, Dr. Veverly Fells Admission status: Inpatient/tele    Date of Service 05/11/2016    Ivor Costa [facility] Pager [phone]  If 7PM-7AM, please contact night-coverage [email or web address] Password [email or web address] 05/11/2016, 12:02 AM

## 2016-05-10 NOTE — ED Notes (Signed)
ED Provider at bedside. 

## 2016-05-10 NOTE — ED Notes (Signed)
Pt stated she was unable to give urine sample at this time.

## 2016-05-10 NOTE — ED Triage Notes (Signed)
Pt c/o fevers and generalized body aches onset this am. Pt states went to UC and was positive for orthostatic changes and was sent her for further evaluation. Pt was able to tolerate some fluids but BP is still low.  Pt took 400mg  motrin at [facility]  Lying 102/46 HR 121 Sitting 93/45 HR 130 Stabding 74/44 HR 142

## 2016-05-10 NOTE — ED Provider Notes (Addendum)
[facility] DEPT Provider Note   CSN: [id number] Arrival date & time: 05/10/16  1754     History   Chief Complaint Chief Complaint  Patient presents with  . Hypotension    HPI [name] is a 60 y.o. female.  HPI The patient presents with acute onset of fever and shaking chills today. States her fever was up to 104. She denies any shortness of breath or cough. Had one episode of vomiting but no nausea currently. No diarrhea. Patient had mild right maxillary sinus pressure this morning but that is abated. No neck pain or stiffness. Denies sore throat. Patient with right knee replacement in October that required repair after dehiscence. She continues to have a small amount of drainage from the wound. Mild warmth to the site. Denies any worsening of her pain. Was seen in urgent care and referred to the emergency Department due to orthostasis. Patient states she is getting lightheaded when she is standing up. Past Medical History:  Diagnosis Date  . 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 Arthritis KNEES   right knee, hx. past left knee replacemnt.  . Blood clot in vein    around Portacath right chest-tx. Eliquis about a yr., prior Lovenox.  . Chronic anticoagulation HX  PORT-A-CATH CLOT   2010 - HAS BEEN ON LOVENOX SINCE THE CLOT  . Elevated blood pressure reading without diagnosis of hypertension    PT MONITORS HER B/P AT HOME  . Heart murmur   . 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 PONV (postoperative nausea and vomiting)   . Skin cancer of anterior chest BREAST CANCER PRIMARY W/ METS TO CHEST WALL SKIN CANCER--  CHEMOTHERAPY EVERY 3 WEEKS AT [facility]  . Status post skin flap graft    right chest wall with metastatis right anterior chest -no drainage or open wound.    Patient Active Problem List   Diagnosis Date Noted  . Sepsis ([place])  05/10/2016  . UTI (urinary tract infection) 05/10/2016  . Wound infection 05/10/2016  . Cellulitis of right knee 05/10/2016  . Blood clot in vein   . Wound dehiscence, surgical 04/12/2016  . 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 Vitamin D deficiency 01/25/2016  . 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 Fever and chills 02/01/2015  . Primary osteoarthritis of right knee 02/01/2015  . Anemia 02/01/2015  . Frequent UTI 01/12/2014  . Sinus tachycardia 11/03/2013  . DOE (dyspnea on exertion) 11/03/2013  . Postoperative anemia due to acute blood loss 12/24/2012  .    09/23/2012  . Sinusitis 07/28/2012  . 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 Breast cancer, left breast ([place]) 11/12/2010  . Chronic anticoagulation 11/12/2010  . Anxiety, generalized 03/26/2008    Past Surgical History:  Procedure Laterality Date  . CHEST WALL TUMOR EXCISION    . hemotoma     evacuation left chest wall  . IRRIGATION AND DEBRIDEMENT KNEE Right 04/12/2016   Procedure: IRRIGATION AND DEBRIDEMENT KNEE;  Surgeon: Nicholes Stairs, MD;  [facility];  Service: Orthopedics;  Laterality: Right;  . KNEE ARTHROSCOPY  02/13/2012   Procedure: ARTHROSCOPY KNEE;  Surgeon: Johnn Hai, MD;  Location: [facility];  Service: Orthopedics;  Laterality: Left;  WITH DEBRIDEMENt   . KNEE ARTHROSCOPY WITH  LATERAL MENISECTOMY  02/13/2012   Procedure: KNEE ARTHROSCOPY WITH LATERAL MENISECTOMY;  Surgeon: Johnn Hai, MD;  Location: [place];  Service: Orthopedics;;  partial  . LEFT MODIFIED RADICAL MASTECTOMY/ RIGHT TOTAL MASTECTOMY  11-13-2007   LEFT BREAST CANCER W/ AXILLARY LYMPH NODE METASTASIS AND POST NEOADJUVANT CHEMO  . PLACEMENT PORT-A-CATH  06/22/2007   right chest  . SKIN GRAFT     chest-post tumor removal, second occurrence of cancer" 2015 surgery for Flap 2015"  . TOTAL KNEE ARTHROPLASTY Left 09/23/2012    Procedure: LEFT TOTAL KNEE ARTHROPLASTY;  Surgeon: Johnn Hai, MD;  [facility];  Service: Orthopedics;  Laterality: Left;  . TOTAL KNEE ARTHROPLASTY Right 03/20/2016   Procedure: RIGHT TOTAL KNEE ARTHROPLASTY;  Surgeon: Susa Day, MD;  [facility];  Service: Orthopedics;  Laterality: Right;  . TRANSTHORACIC ECHOCARDIOGRAM  11-18-2011  DR BENSIMHON (ECHO EVERY 3 MONTHS)   HX CHEMO INDUCED CARDIOTOXICITY/  LVSF NORMAL/ EF 55-50%/ MILD MITRIAL VALVE REGURG./ MILDLY INCREASED SYSTOLIC PRESSURE OF PULMONARY ARTERIES    OB History    No data available       Home Medications    Prior to Admission medications   Medication Sig Start Date End Date Taking? Authorizing Provider  apixaban (ELIQUIS) 5 MG TABS tablet Take 5 mg by mouth 2 (two) times daily. Will stop prior to proceure 09/10/15  Yes Historical Provider, MD  celecoxib (CELEBREX) 200 MG capsule TAKE 1 CAPSULE BY MOUTH TWICE DAILY Patient taking differently: 1 Capsule by mouth once daily 04/02/16  Yes Midge Minium, MD  Cholecalciferol (VITAMIN D3) 2000 UNITS TABS Take 1 tablet by mouth 4 (four) times a week.    Yes Historical Provider, MD  darifenacin (ENABLEX) 15 MG 24 hr tablet Take 15 mg by mouth daily.   Yes Historical Provider, MD  docusate sodium (COLACE) 100 MG capsule Take 1 capsule (100 mg total) by mouth 2 (two) times daily as needed for mild constipation. 03/20/16  Yes Susa Day, MD  gabapentin (NEURONTIN) 300 MG capsule TAKE 1 CAPSULE BY MOUTH 3 TIMES DAILY 02/11/16  Yes Midge Minium, MD  ondansetron (ZOFRAN) 8 MG tablet Take 1 tablet (8 mg total) by mouth every 8 (eight) hours as needed for nausea. 06/19/11  Yes Zenia Resides, MD  polyethylene glycol (MIRALAX / GLYCOLAX) packet Take 17 g by mouth daily. 03/20/16  Yes Susa Day, MD  zolpidem (AMBIEN) 5 MG tablet TAKE 1 TABLET BY MOUTH AT BEDTIME AS NEEDED FOR SLEEP 03/17/16  Yes Midge Minium, MD  cephALEXin (KEFLEX) 500 MG capsule Take  1 capsule (500 mg total) by mouth 4 (four) times daily. Patient not taking: Reported on 05/10/2016 04/13/16   Bryson L Stilwell, PA-C  methocarbamol (ROBAXIN) 500 MG tablet Take 1 tablet (500 mg total) by mouth every 6 (six) hours as needed for muscle spasms. Patient not taking: Reported on 05/10/2016 03/20/16   Susa Day, MD  oxyCODONE-acetaminophen (PERCOCET) 5-325 MG tablet Take 1-2 tablets by mouth every 4 (four) hours as needed for severe pain. Patient not taking: Reported on 05/10/2016 03/20/16   Susa Day, MD  traMADol (ULTRAM) 50 MG tablet TAKE 1 TABLET BY MOUTH EVERY 6 HOURS AS NEEDED FOR PAIN Patient not taking: Reported on 04/12/2016 01/09/15   Doe-Hyun R Shawna Orleans, DO    Family History Family History  Problem Relation Age of Onset  . Heart disease Mother     due to mitral valve regurgiation,   . Cancer  Mother     breast  . Parkinsonism Father   . Alcohol abuse Paternal Grandfather     Social History Social History  Substance Use Topics  . Smoking status: Never Smoker  . Smokeless tobacco: Never Used  . Alcohol use No     Allergies   Tape; Other; and Penicillins   Review of Systems Review of Systems  Constitutional: Positive for chills, fatigue and fever.  HENT: Positive for sinus pressure. Negative for congestion, facial swelling, rhinorrhea and sore throat.   Respiratory: Negative for cough, shortness of breath and wheezing.   Cardiovascular: Negative for chest pain, palpitations and leg swelling.  Gastrointestinal: Positive for vomiting. Negative for abdominal pain, constipation, diarrhea and nausea.  Genitourinary: Negative for dysuria, flank pain, frequency and hematuria.  Musculoskeletal: Positive for joint swelling. Negative for back pain, myalgias, neck pain and neck stiffness.  Skin: Positive for wound. Negative for rash.  Neurological: Positive for dizziness and light-headedness. Negative for syncope, weakness, numbness and headaches.  All other  systems reviewed and are negative.    Physical Exam Updated Vital Signs BP (!) 104/53 (BP Location: Right Arm)   Pulse 96   Temp 99 F (37.2 C) (Oral)   Resp 18   Ht '5\' 7"'$  (1.702 m)   Wt 155 lb (70.3 kg)   LMP 06/22/2007   SpO2 96%   BMI 24.28 kg/m   Physical Exam  Constitutional: She is oriented to person, place, and time. She appears well-developed and well-nourished. No distress.  HENT:  Head: Normocephalic and atraumatic.  Mouth/Throat: Oropharynx is clear and moist. No oropharyngeal exudate.  Eyes: EOM are normal. Pupils are equal, round, and reactive to light.  Neck: Normal range of motion. Neck supple.  No meningismus  Cardiovascular: Regular rhythm.   Murmur heard. Tachycardia  Pulmonary/Chest: Effort normal and breath sounds normal. No respiratory distress. She has no wheezes. She has no rales.  Abdominal: Soft. Bowel sounds are normal. She exhibits no distension and no mass. There is no tenderness. There is no rebound and no guarding. No hernia.  Musculoskeletal: Normal range of motion. She exhibits edema. She exhibits no tenderness.  Patient with midline right knee scar with a small area of dehiscence and drainage of serosanguineous fluid. There is surrounding erythema and warmth to the anterior knee. Distal pulses are intact.  Lymphadenopathy:    She has no cervical adenopathy.  Neurological: She is alert and oriented to person, place, and time.  5/5 motor in all extremities. Sensation intact.  Skin: Skin is warm and dry. Capillary refill takes less than 2 seconds. No rash noted. There is erythema.  Psychiatric: She has a normal mood and affect. Her behavior is normal.  Nursing note and vitals reviewed.    ED Treatments / Results  Labs (all labs ordered are listed, but only abnormal results are displayed) Labs Reviewed  COMPREHENSIVE METABOLIC PANEL - Abnormal; Notable for the following:       Result Value   Sodium 132 (*)    Chloride 98 (*)    Calcium  8.7 (*)    AST 101 (*)    ALT 57 (*)    Alkaline Phosphatase 138 (*)    Total Bilirubin 1.7 (*)    All other components within normal limits  CBC WITH DIFFERENTIAL/PLATELET - Abnormal; Notable for the following:    WBC 14.7 (*)    Hemoglobin 11.8 (*)    HCT 35.7 (*)    RDW 16.5 (*)    Platelets  114 (*)    Neutro Abs 13.1 (*)    Lymphs Abs 0.6 (*)    All other components within normal limits  PROTIME-INR - Abnormal; Notable for the following:    Prothrombin Time 16.5 (*)    All other components within normal limits  URINALYSIS, ROUTINE W REFLEX MICROSCOPIC - Abnormal; Notable for the following:    APPearance HAZY (*)    Leukocytes, UA MODERATE (*)    Bacteria, UA RARE (*)    Squamous Epithelial / LPF 0-5 (*)    All other components within normal limits  CULTURE, BLOOD (ROUTINE X 2)  CULTURE, BLOOD (ROUTINE X 2)  URINE CULTURE  INFLUENZA PANEL BY PCR (TYPE A & B, H1N1)  I-STAT BETA HCG BLOOD, ED ([facility], [facility], [facility] ONLY)  I-STAT CG4 LACTIC ACID, ED  I-STAT CG4 LACTIC ACID, ED    EKG  EKG Interpretation None       Radiology Dg Chest 2 View  Result Date: 05/10/2016 CLINICAL DATA:  Fever.  History of breast cancer. EXAM: CHEST  2 VIEW COMPARISON:  February 01, 2015 FINDINGS: A right Port-A-Cath is stable. No pneumothorax. The cardiomediastinal silhouette is stable. The left hilum is retracted superiorly but this is a stable finding. No pulmonary nodules or masses. No acute interval changes. IMPRESSION: No active cardiopulmonary disease. Electronically Signed   By: Dorise Bullion III M.D   On: 05/10/2016 18:53    Procedures Procedures (including critical care time)  Medications Ordered in ED Medications  vancomycin (VANCOCIN) 1,250 mg in sodium chloride 0.9 % 250 mL IVPB (not administered)  sodium chloride 0.9 % bolus 1,000 mL (0 mLs Intravenous Stopped 05/10/16 2027)  vancomycin (VANCOCIN) IVPB 1000 mg/200 mL premix (0 mg Intravenous Stopped 05/10/16 2035)  sodium chloride  0.9 % bolus 1,000 mL (0 mLs Intravenous Stopped 05/10/16 2129)     Initial Impression / Assessment and Plan / ED Course  I have reviewed the triage vital signs and the nursing notes.  Pertinent labs & imaging results that were available during my care of the patient were reviewed by me and considered in my medical decision making (see chart for details).  Clinical Course     Concern for right knee cellulitis versus infected hardware. Blood pressures improved after IV fluids. Initiated vancomycin the emergency department.  Discussed with Blaine Hamper and will admit to telemetry bed. Final Clinical Impressions(s) / ED Diagnoses   Final diagnoses:  Cellulitis of right knee  Orthostatic hypotension    New Prescriptions New Prescriptions   No medications on file     Julianne Rice, MD 05/10/16 2130 Relayed to Dr. Veverly Fells needed for orthopedic consult for possible septic joint. States will evaluate patient this evening.   Julianne Rice, MD 05/10/16 2203

## 2016-05-10 NOTE — Progress Notes (Signed)
Pharmacy Antibiotic Note  [name] is a 60 y.o. female admitted on 05/10/2016 with cellulitis.  Pharmacy has been consulted for Vancomycin dosing.  Plan: Vancomycin 1g x 1 then 1250mg  IV every 24 hours.  Goal trough 10-15 mcg/mL.  Height: 5\' 7"  (170.2 cm) Weight: 155 lb (70.3 kg) IBW/kg (Calculated) : 61.6  Temp (24hrs), Avg:99.8 F (37.7 C), Min:99.8 F (37.7 C), Max:99.8 F (37.7 C)   Recent Labs Lab 05/10/16 1850 05/10/16 1901  CREATININE 1.00  --   LATICACIDVEN  --  1.53    Estimated Creatinine Clearance: 58.9 mL/min (by C-G formula based on SCr of 1 mg/dL).    Allergies  Allergen Reactions  . Tape Hives    Can tolerate paper tape  . Other Rash    STERI STRIPS - Blisters  . Penicillins Rash    Denies airway involvement Has patient had a PCN reaction causing immediate rash, facial/tongue/throat swelling, SOB or lightheadedness with hypotension: No Has patient had a PCN reaction causing severe rash involving mucus membranes or skin necrosis: No Has patient had a PCN reaction that required hospitalization No Has patient had a PCN reaction occurring within the last 10 years: No If all of the above answers are "NO", then may proceed with Cephalosporin use.       Thank you for allowing pharmacy to be a part of this patient's care.    Adrian Saran, PharmD, BCPS Pager [phone] 05/10/2016 7:20 PM

## 2016-05-10 NOTE — ED Notes (Signed)
No respiratory or acute distress noted alert and oriented x 3 call light in reach visitor at beside.

## 2016-05-10 NOTE — Progress Notes (Signed)
Rx Brief note: Azactam  See note by Christean Grief 12/16 for full details  Plan: Azactam 2Gm IV q8h Note Pt has tolerated cephalosporins in past > consider switching  Thanks Dorrene German 05/10/2016 10:19 PM

## 2016-05-10 NOTE — Consult Note (Signed)
Reason for Consult:right knee erythema and swelling s/p TKR Referring Physician: EDP  [name] is an 60 y.o. female.  HPI: 60 yo female who is s/p TKR in October complicated by a wound dehiscence after fall.  Patient had an I+D and repair after that and has been in therapy.  She presents with a 24 hour history of fevers and chills.  She thought she had the flu and presented for eval.  She reports that the leg swelling is less than it had been.  Past Medical History:  Diagnosis Date  . 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 Arthritis KNEES   right knee, hx. past left knee replacemnt.  . Blood clot in vein    around Portacath right chest-tx. Eliquis about a yr., prior Lovenox.  . Chronic anticoagulation HX  PORT-A-CATH CLOT   2010 - HAS BEEN ON LOVENOX SINCE THE CLOT  . Elevated blood pressure reading without diagnosis of hypertension    PT MONITORS HER B/P AT HOME  . Heart murmur   . 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 PONV (postoperative nausea and vomiting)   . Skin cancer of anterior chest BREAST CANCER PRIMARY W/ METS TO CHEST WALL SKIN CANCER--  CHEMOTHERAPY EVERY 3 WEEKS AT [facility]  . Status post skin flap graft    right chest wall with metastatis right anterior chest -no drainage or open wound.    Past Surgical History:  Procedure Laterality Date  . CHEST WALL TUMOR EXCISION    . hemotoma     evacuation left chest wall  . IRRIGATION AND DEBRIDEMENT KNEE Right 04/12/2016   Procedure: IRRIGATION AND DEBRIDEMENT KNEE;  Surgeon: Nicholes Stairs, MD;  [facility];  Service: Orthopedics;  Laterality: Right;  . KNEE ARTHROSCOPY  02/13/2012   Procedure: ARTHROSCOPY KNEE;  Surgeon: Johnn Hai, MD;  Location: [facility];  Service: Orthopedics;  Laterality: Left;  WITH DEBRIDEMENt   . KNEE ARTHROSCOPY WITH LATERAL  MENISECTOMY  02/13/2012   Procedure: KNEE ARTHROSCOPY WITH LATERAL MENISECTOMY;  Surgeon: Johnn Hai, MD;  Location: [place];  Service: Orthopedics;;  partial  . LEFT MODIFIED RADICAL MASTECTOMY/ RIGHT TOTAL MASTECTOMY  11-13-2007   LEFT BREAST CANCER W/ AXILLARY LYMPH NODE METASTASIS AND POST NEOADJUVANT CHEMO  . PLACEMENT PORT-A-CATH  06/22/2007   right chest  . SKIN GRAFT     chest-post tumor removal, second occurrence of cancer" 2015 surgery for Flap 2015"  . TOTAL KNEE ARTHROPLASTY Left 09/23/2012   Procedure: LEFT TOTAL KNEE ARTHROPLASTY;  Surgeon: Johnn Hai, MD;  [facility];  Service: Orthopedics;  Laterality: Left;  . TOTAL KNEE ARTHROPLASTY Right 03/20/2016   Procedure: RIGHT TOTAL KNEE ARTHROPLASTY;  Surgeon: Susa Day, MD;  [facility];  Service: Orthopedics;  Laterality: Right;  . TRANSTHORACIC ECHOCARDIOGRAM  11-18-2011  DR BENSIMHON (ECHO EVERY 3 MONTHS)   HX CHEMO INDUCED CARDIOTOXICITY/  LVSF NORMAL/ EF 55-50%/ MILD MITRIAL VALVE REGURG./ MILDLY INCREASED SYSTOLIC PRESSURE OF PULMONARY ARTERIES    Family History  Problem Relation Age of Onset  . Heart disease Mother     due to mitral valve regurgiation,   . Cancer Mother     breast  . Parkinsonism Father   . Alcohol abuse Paternal Grandfather     Social History:  reports that  she has never smoked. She has never used smokeless tobacco. She reports that she does not drink alcohol or use drugs.  Allergies:  Allergies  Allergen Reactions  . Tape Hives    Can tolerate paper tape  . Other Rash    STERI STRIPS - Blisters  . Penicillins Rash    Denies airway involvement Has patient had a PCN reaction causing immediate rash, facial/tongue/throat swelling, SOB or lightheadedness with hypotension: No Has patient had a PCN reaction causing severe rash involving mucus membranes or skin necrosis: No Has patient had a PCN reaction that required hospitalization No Has patient had a PCN  reaction occurring within the last 10 years: No If all of the above answers are "NO", then may proceed with Cephalosporin use.      Medications: I have reviewed the patient's current medications.  Results for orders placed or performed during the hospital encounter of 05/10/16 (from the past 48 hour(s))  Comprehensive metabolic panel     Status: Abnormal   Collection Time: 05/10/16  6:50 PM  Result Value Ref Range   Sodium 132 (L) 135 - 145 mmol/L   Potassium 3.9 3.5 - 5.1 mmol/L   Chloride 98 (L) 101 - 111 mmol/L   CO2 23 22 - 32 mmol/L   Glucose, Bld 99 65 - 99 mg/dL   BUN 20 6 - 20 mg/dL   Creatinine, Ser 1.00 0.44 - 1.00 mg/dL   Calcium 8.7 (L) 8.9 - 10.3 mg/dL   Total Protein 7.4 6.5 - 8.1 g/dL   Albumin 3.7 3.5 - 5.0 g/dL   AST 101 (H) 15 - 41 U/L   ALT 57 (H) 14 - 54 U/L   Alkaline Phosphatase 138 (H) 38 - 126 U/L   Total Bilirubin 1.7 (H) 0.3 - 1.2 mg/dL   GFR calc non Af Amer >60 >60 mL/min   GFR calc Af Amer >60 >60 mL/min    Comment: (NOTE) The eGFR has been calculated using the CKD EPI equation. This calculation has not been validated in all clinical situations. eGFR's persistently <60 mL/min signify possible Chronic Kidney Disease.    Anion gap 11 5 - 15  CBC with Differential     Status: Abnormal   Collection Time: 05/10/16  6:50 PM  Result Value Ref Range   WBC 14.7 (H) 4.0 - 10.5 K/uL   RBC 3.90 3.87 - 5.11 MIL/uL   Hemoglobin 11.8 (L) 12.0 - 15.0 g/dL   HCT 35.7 (L) 36.0 - 46.0 %   MCV 91.5 78.0 - 100.0 fL   MCH 30.3 26.0 - 34.0 pg   MCHC 33.1 30.0 - 36.0 g/dL   RDW 16.5 (H) 11.5 - 15.5 %   Platelets 114 (L) 150 - 400 K/uL    Comment: REPEATED TO VERIFY SPECIMEN CHECKED FOR CLOTS PLATELET COUNT CONFIRMED BY SMEAR    Neutrophils Relative % 89 %   Lymphocytes Relative 4 %   Monocytes Relative 7 %   Eosinophils Relative 0 %   Basophils Relative 0 %   Neutro Abs 13.1 (H) 1.7 - 7.7 K/uL   Lymphs Abs 0.6 (L) 0.7 - 4.0 K/uL   Monocytes Absolute 1.0  0.1 - 1.0 K/uL   Eosinophils Absolute 0.0 0.0 - 0.7 K/uL   Basophils Absolute 0.0 0.0 - 0.1 K/uL   Smear Review PLATELET COUNT CONFIRMED BY SMEAR   Protime-INR     Status: Abnormal   Collection Time: 05/10/16  6:50 PM  Result Value Ref Range  Prothrombin Time 16.5 (H) 11.4 - 15.2 seconds   INR 1.32   I-Stat beta hCG blood, ED     Status: None   Collection Time: 05/10/16  6:59 PM  Result Value Ref Range   I-stat hCG, quantitative <5.0 <5 mIU/mL   Comment 3            Comment:   GEST. AGE      CONC.  (mIU/mL)   <=1 WEEK        5 - 50     2 WEEKS       50 - 500     3 WEEKS       100 - 10,000     4 WEEKS     1,000 - 30,000        FEMALE AND NON-PREGNANT FEMALE:     LESS THAN 5 mIU/mL   I-Stat CG4 Lactic Acid, ED     Status: None   Collection Time: 05/10/16  7:01 PM  Result Value Ref Range   Lactic Acid, Venous 1.53 0.5 - 1.9 mmol/L  Influenza panel by PCR (type A & B, H1N1)     Status: None   Collection Time: 05/10/16  7:43 PM  Result Value Ref Range   Influenza A By PCR NEGATIVE NEGATIVE   Influenza B By PCR NEGATIVE NEGATIVE    Comment: (NOTE) The Xpert Xpress Flu assay is intended as an aid in the diagnosis of  influenza and should not be used as a sole basis for treatment.  This  assay is FDA approved for nasopharyngeal swab specimens only. Nasal  washings and aspirates are unacceptable for Xpert Xpress Flu testing.   Urinalysis, Routine w reflex microscopic     Status: Abnormal   Collection Time: 05/10/16  8:56 PM  Result Value Ref Range   Color, Urine YELLOW YELLOW   APPearance HAZY (A) CLEAR   Specific Gravity, Urine 1.008 1.005 - 1.030   pH 5.0 5.0 - 8.0   Glucose, UA NEGATIVE NEGATIVE mg/dL   Hgb urine dipstick NEGATIVE NEGATIVE   Bilirubin Urine NEGATIVE NEGATIVE   Ketones, ur NEGATIVE NEGATIVE mg/dL   Protein, ur NEGATIVE NEGATIVE mg/dL   Nitrite NEGATIVE NEGATIVE   Leukocytes, UA MODERATE (A) NEGATIVE   RBC / HPF 0-5 0 - 5 RBC/hpf   WBC, UA 6-30 0 - 5  WBC/hpf   Bacteria, UA RARE (A) NONE SEEN   Squamous Epithelial / LPF 0-5 (A) NONE SEEN  Lactic acid, plasma     Status: None   Collection Time: 05/10/16 10:01 PM  Result Value Ref Range   Lactic Acid, Venous 1.2 0.5 - 1.9 mmol/L    Dg Chest 2 View  Result Date: 05/10/2016 CLINICAL DATA:  Fever.  History of breast cancer. EXAM: CHEST  2 VIEW COMPARISON:  February 01, 2015 FINDINGS: A right Port-A-Cath is stable. No pneumothorax. The cardiomediastinal silhouette is stable. The left hilum is retracted superiorly but this is a stable finding. No pulmonary nodules or masses. No acute interval changes. IMPRESSION: No active cardiopulmonary disease. Electronically Signed   By: Dorise Bullion III M.D   On: 05/10/2016 18:53    ROS Blood pressure 101/56, pulse 95, temperature 99 F (37.2 C), temperature source Oral, resp. rate 20, height 5' 7"  (1.702 m), weight 70.3 kg (155 lb), last menstrual period 06/22/2007, SpO2 94 %. Physical Exam  Healthy appearing female in no apparent distress. Right knee with healing total knee midline incision with open area distal  to patella in the midline with some granulation tissue present.  Moderate swelling and warmth and erythema is present.  No pain with calf pumps some pain with AROM no cords  Assessment/Plan: Right knee erythema concerning for cellulitis after TKR.  Despite patient reporting clinical improvement in the appearance of her knee, I am concerned about deep infection.  Will confer with Dr Paralee Cancel total joint specialist in the morning for a plan of action. Empiric IV antibiotics started.  Maintain NPO  Renny Remer,STEVEN R 05/10/2016, 11:50 PM

## 2016-05-11 ENCOUNTER — Encounter (HOSPITAL_COMMUNITY): Payer: Self-pay | Admitting: Family Medicine

## 2016-05-11 LAB — BASIC METABOLIC PANEL
Anion gap: 6 (ref 5–15)
BUN: 17 mg/dL (ref 6–20)
CALCIUM: 8.1 mg/dL — AB (ref 8.9–10.3)
CHLORIDE: 104 mmol/L (ref 101–111)
CO2: 24 mmol/L (ref 22–32)
CREATININE: 0.63 mg/dL (ref 0.44–1.00)
GFR calc Af Amer: >60 mL/min (ref >60)
GFR calc non Af Amer: >60 mL/min (ref >60)
GLUCOSE: 116 mg/dL — AB (ref 65–99)
Potassium: 3.5 mmol/L (ref 3.5–5.1)
Sodium: 134 mmol/L — ABNORMAL LOW (ref 135–145)

## 2016-05-11 LAB — APTT
aPTT: 54 seconds — ABNORMAL HIGH (ref 24–36)
aPTT: 186 seconds (ref 24–36)

## 2016-05-11 LAB — CBC
HCT: 30.6 % — ABNORMAL LOW (ref 36.0–46.0)
Hemoglobin: 10.1 g/dL — ABNORMAL LOW (ref 12.0–15.0)
MCH: 30.4 pg (ref 26.0–34.0)
MCHC: 33 g/dL (ref 30.0–36.0)
MCV: 92.2 fL (ref 78.0–100.0)
PLATELETS: 93 10*3/uL — AB (ref 150–400)
RBC: 3.32 MIL/uL — ABNORMAL LOW (ref 3.87–5.11)
RDW: 16.5 % — ABNORMAL HIGH (ref 11.5–15.5)
WBC: 16.6 10*3/uL — ABNORMAL HIGH (ref 4.0–10.5)

## 2016-05-11 LAB — SEDIMENTATION RATE: Sed Rate: 45 mm/hr — ABNORMAL HIGH (ref 0–22)

## 2016-05-11 LAB — LACTIC ACID, PLASMA: Lactic Acid, Venous: 1.4 mmol/L (ref 0.5–1.9)

## 2016-05-11 LAB — HEPARIN LEVEL (UNFRACTIONATED)
HEPARIN UNFRACTIONATED: 1.6 [IU]/mL — AB (ref 0.30–0.70)
Heparin Unfractionated: 1.73 IU/mL — ABNORMAL HIGH (ref 0.30–0.70)

## 2016-05-11 LAB — PROCALCITONIN: Procalcitonin: 13.81 ng/mL

## 2016-05-11 LAB — C-REACTIVE PROTEIN: CRP: 7 mg/dL — ABNORMAL HIGH (ref <1.0)

## 2016-05-11 LAB — GLUCOSE, CAPILLARY: Glucose-Capillary: 88 mg/dL (ref 65–99)

## 2016-05-11 MED ORDER — APIXABAN 5 MG PO TABS
5.0000 mg | ORAL_TABLET | Freq: Two times a day (BID) | ORAL | Status: DC
  Administered 2016-05-11 – 2016-05-12 (×3): 5 mg via ORAL
  Filled 2016-05-11 (×3): qty 1

## 2016-05-11 MED ORDER — HEPARIN (PORCINE) IN NACL 100-0.45 UNIT/ML-% IJ SOLN: 800.0000 [IU]/h | INTRAMUSCULAR | Status: DC

## 2016-05-11 MED ORDER — SODIUM CHLORIDE 0.9% FLUSH
10.0000 mL | INTRAVENOUS | Status: DC | PRN
  Administered 2016-05-11: 20 mL
  Administered 2016-05-11 – 2016-05-12 (×2): 10 mL
  Filled 2016-05-11 (×3): qty 40

## 2016-05-11 MED ORDER — BACITRACIN ZINC 500 UNIT/GM EX OINT
TOPICAL_OINTMENT | Freq: Two times a day (BID) | CUTANEOUS | Status: DC
  Administered 2016-05-11 – 2016-05-12 (×2): 1 via TOPICAL
  Filled 2016-05-11 (×2): qty 0.9

## 2016-05-11 NOTE — Consult Note (Signed)
[place] Nurse wound consult note Reason for Consult:Patient with knee replacement, fell at home and knee injury.  Small full thickness wound at distal end of incision line.  Patient and spouse report edema, induration and warmth are much improved. Wound type:Surgical, traumatic Pressure Ulcer POA: No Measurement:1cm x 0.4cm x 0.2cm Wound bed:red, dry Drainage (amount, consistency, odor) scant serous Periwound:intact, warm, mild erythema, resolving edema Dressing procedure/placement/frequency: I will continue POC that patient is using at home with the exception of changing from polysporin to bacitracin ointment and continue twice daily. The non-adherent wrap provided by family is to be used to secure dressing as patient has history of MARSI (medical adhesive related skin injuries). [facility] nursing team will not follow, but will remain available to this patient, the nursing and medical teams.  Please re-consult if needed. Thanks, Maudie Flakes, MSN, RN, [place], Arther Abbott  Pager# [phone]

## 2016-05-11 NOTE — Progress Notes (Signed)
Patient ID: [name], female   DOB: 1955-10-30, 60 y.o.   MRN: [id number] Agree with Dr. Veverly Fells' assessment  Though high fevers she has been noting steady and daily improvement with regards to her knee not the opposite as would be expected with an acutely worsening septic knee  Knee wound improving  Continue to observe conservatively at this point, I see no indication for a urgent procedure  Suggested ordering C-RP and ESR as purely baseline tests as they would be very difficult to interpret related to her presenting history  [place] can continue to follow as planned Regular diet ordered

## 2016-05-11 NOTE — Progress Notes (Signed)
ANTICOAGULATION CONSULT NOTE - Follow Up  Pharmacy Consult for IV heparin Indication: Port-a-cath clot  Allergies  Allergen Reactions  . Tape Hives    Can tolerate paper tape  . Other Rash    STERI STRIPS - Blisters  . Penicillins Rash    Denies airway involvement Has patient had a PCN reaction causing immediate rash, facial/tongue/throat swelling, SOB or lightheadedness with hypotension: No Has patient had a PCN reaction causing severe rash involving mucus membranes or skin necrosis: No Has patient had a PCN reaction that required hospitalization No Has patient had a PCN reaction occurring within the last 10 years: No If all of the above answers are "NO", then may proceed with Cephalosporin use.      Patient Measurements: Height: _0  (170.2 cm) Weight: 155 lb (70.3 kg) IBW/kg (Calculated) : 61.6 Heparin Dosing Weight: 70.3 kg  Vital Signs: Temp: 98.1 F (36.7 C) (12/17 0600) Temp Source: Oral (12/17 0600) BP: 118/53 (12/17 0600) Pulse Rate: 98 (12/17 0600)  Labs:  Recent Labs  05/10/16 1850 05/11/16 0145 05/11/16 0823  HGB 11.8* 10.1*  --   HCT 35.7* 30.6*  --   PLT 114* 93*  --   APTT  --  54* 186*  LABPROT 16.5*  --   --   INR 1.32  --   --   HEPARINUNFRC  --  1.60* 1.73*  CREATININE 1.00 0.63  --     Estimated Creatinine Clearance: 73.6 mL/min (by C-G formula based on SCr of 0.63 mg/dL).   Medical History: Past Medical History:  Diagnosis Date  . 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 Arthritis KNEES   right knee, hx. past left knee replacemnt.  . Blood clot in vein    around Portacath right chest-tx. Eliquis about a yr., prior Lovenox.  . Chronic anticoagulation HX  PORT-A-CATH CLOT   2010 - HAS BEEN ON LOVENOX SINCE THE CLOT  . Elevated blood pressure reading without diagnosis of hypertension    PT MONITORS HER B/P AT HOME  . Heart murmur   . 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 PONV (postoperative nausea and vomiting)   . Skin cancer of anterior chest BREAST CANCER PRIMARY W/ METS TO CHEST WALL SKIN CANCER--  CHEMOTHERAPY EVERY 3 WEEKS AT [facility]  . Status post skin flap graft    right chest wall with metastatis right anterior chest -no drainage or open wound.    Medications:  Scheduled:  . aztreonam  2 g Intravenous Q8H  . celecoxib  200 mg Oral BID  . cholecalciferol  1,000 Units Oral Once per day on Sun Tue Thu Sat  . darifenacin  15 mg Oral Daily  . gabapentin  300 mg Oral TID  . polyethylene glycol  17 g Oral Daily  . sodium chloride flush  3 mL Intravenous Q12H  . vancomycin  1,250 mg Intravenous Q24H   Infusions:  . sodium chloride 125 mL/hr at 05/11/16 0216  . heparin 1,000 Units/hr (05/11/16 0216)    Assessment: 15 yoF admitted with cellulitis on eliquis PTA for hx of port-a-cath clot.  MD now wants to transition to IV heparin in case pt requires a procedure while inpt.  IV heparin per Rx.  -Will check baseline HL as will likely be elevated d/t eliquis- HL=1.6 and aptt=54  Today, 05/11/16:  Heparin level supratherapeutic as expected due to apixaban patient was on with last dose reported 12/16 at 8am  APTT supratherapeutic on heparin current rate of 1000 units/hr  Per RN, no bleeding issues or problems with heparin IV, confirmed drip running at 1000 units/hr  Goal of Therapy:  aPtt = 66-102 Heparin level 0.3-0.7 units/ml Monitor platelets by anticoagulation protocol: Yes    Plan:  1) Hold heparin x 1 hour then reduce IV heparin from 1000 units/hr to 800 units/hr 2) Recheck aPTT/heparin level 6 hours after heparin restarted 2) Daily HL/CBC/aPtt 3) Will follow aPtts and HL until correlate with HL (i.e. apixaban no longer on board falsely affecting heparin level)   Adrian Saran, PharmD, BCPS Pager [phone] 05/11/2016 10:06 AM

## 2016-05-11 NOTE — Progress Notes (Signed)
PROGRESS NOTE  [name]  PPJ:[id number] DOB: [birth year] DOA: 05/10/2016 PCP: Annye Asa, MD   Brief Narrative: [name] is a 60 y.o. female with history of breast cancer (s/p of bilateral mastectomy, on chemotherapy in [facility] currently), blood clot in the vein of Port-A-Cath (on eliquis), who presented 12/16 with fever, chills, right knee infection and increased urinary frequency.  She had a knee replacement in October, complicated by wound dehiscence following a fall requiring I&D and repair. She continues to have swelling, redness and warmth in the knee. She'd been getting lightheaded upon standing and feeling ill prompting urgent care visit where she had an episode of emesis, was found to be orthostatic, and sent to the ED. On arrival she was tachycardic, tachypneic, temp 99.56F, WBC 14.7, lactate 1.53, with WBCs and bacteria on urinalysis and negative CXR. Blood pressures were soft (93/52). She was admitted for further management and orthopedic surgery, Dr. Veverly Fells, was consulted for possible joint infection.  Assessment & Plan: Principal Problem:   Infection of right knee (HCC) Active Problems:   Breast cancer, left breast (HCC)   Chronic anticoagulation   Sepsis ([place])   Blood clot in vein   UTI (urinary tract infection)   Cellulitis of right knee  Sepsis: Right knee cellulitis, unlikely joint infection per orthopedics, as well as possible urinary source. Immunocompromised pt due to chemotherapy. Leukocytosis worse overnight, lactate remains normal. Tachycardic, though this is thought to be baseline.  - Baseline HR over past few months noted to be consistently in 90's. >> Pt requests DC telemetry and I think this is appropriate - Continue empiric broad antimicrobial treatment with vancomycin and aztreonam. WBC up, but doubt inadequate coverage with this. - Monitor urine and blood cultures (12/16) - Baseline ESR 45, and CRP 7.0, will recheck as outpatient - wound care  consult - IVF: 2.0 L of NS bolus in ED, followed by 125 cc/hr > significant UOP and taking po suspect dehydration resolved, will decrease rate and stop in AM.  - f/u ortho recommendations: no procedure planned so will transition back to eliquis, stop heparin gtt  UTI: Patient has increased urinary frequency, and positive urinalysis, consistent with UTI. H/o klebsiella in 2015 resistant to amp, intermediate to macrobid.  - Abx as above - Follow up urine culture   Blood clot in vein: - Restart eliquis  Breast cancer: S/p of chemotherapy and bilateral mastectomy. Patient has been followed up by oncology, Dr. Philipp Ovens in [place]. She is getting every 3 week chemotherapy, last dose was 2 weeks ago. -Follow-up with Dr. Philipp Ovens  DVT prophylaxis: Eliquis Code Status: Full Family Communication: Regular Disposition Plan: Anticipate DC to home environment in the next 48 hours.  Consultants:   Orthopedics, Dr. Alvan Dame  Procedures:   None  Antimicrobials:  Vancomycin 12/16 >>   Aztreonam 12/16 >>    Subjective: Pt reports feeling better after IV fluids overnight. Says her knee has overall been getting better, less and less pain. No chest pain, dyspnea, palpitations.   Objective: Vitals:   05/10/16 2029 05/10/16 2123 05/10/16 2130 05/11/16 0600  BP: (!) 100/52 (!) 104/53 101/56 (!) 118/53  Pulse: 100 96 95 98  Resp: '23 18 20 20  '$ Temp: 99 F (37.2 C)   98.1 F (36.7 C)  TempSrc: Oral   Oral  SpO2: 96% 96% 94% 98%  Weight:      Height:        Intake/Output Summary (Last 24 hours) at 05/11/16 5809 Last data filed  at 05/11/16 0500  Gross per 24 hour  Intake           654.41 ml  Output                0 ml  Net           654.41 ml   Filed Weights   05/10/16 1859  Weight: 70.3 kg (155 lb)    Examination: General exam: 60 y.o. female in no distress Respiratory system: Non-labored breathing room air. Clear to auscultation bilaterally.  Cardiovascular system: Regular rate  and rhythm. No murmur, rub, or gallop. No JVD, and no pedal edema. Gastrointestinal system: Abdomen soft, non-tender, non-distended, with normoactive bowel sounds. No suprapubic tenderness. No organomegaly or masses felt. Central nervous system: Alert and oriented. No focal neurological deficits. Extremities: Right knee with erythema, swelling without focal fluctuance surrounding vertical incision. No drainage on my exam. Left knee swollen with more remote vertical incision scar. No erythema.  Skin: Warm, well-perfused.  Psychiatry: Judgement and insight appear normal. Mood & affect appropriate.   Data Reviewed: I have personally reviewed following labs and imaging studies  CBC:  Recent Labs Lab 05/10/16 1850 05/11/16 0145  WBC 14.7* 16.6*  NEUTROABS 13.1*  --   HGB 11.8* 10.1*  HCT 35.7* 30.6*  MCV 91.5 92.2  PLT 114* 93*   Basic Metabolic Panel:  Recent Labs Lab 05/10/16 1850 05/11/16 0145  NA 132* 134*  K 3.9 3.5  CL 98* 104  CO2 23 24  GLUCOSE 99 116*  BUN 20 17  CREATININE 1.00 0.63  CALCIUM 8.7* 8.1*   GFR: Estimated Creatinine Clearance: 73.6 mL/min (by C-G formula based on SCr of 0.63 mg/dL). Liver Function Tests:  Recent Labs Lab 05/10/16 1850  AST 101*  ALT 57*  ALKPHOS 138*  BILITOT 1.7*  PROT 7.4  ALBUMIN 3.7   No results for input(s): LIPASE, AMYLASE in the last 168 hours. No results for input(s): AMMONIA in the last 168 hours. Coagulation Profile:  Recent Labs Lab 05/10/16 1850  INR 1.32   Cardiac Enzymes: No results for input(s): CKTOTAL, CKMB, CKMBINDEX, TROPONINI in the last 168 hours. BNP (last 3 results) No results for input(s): PROBNP in the last 8760 hours. HbA1C: No results for input(s): HGBA1C in the last 72 hours. CBG: No results for input(s): GLUCAP in the last 168 hours. Lipid Profile: No results for input(s): CHOL, HDL, LDLCALC, TRIG, CHOLHDL, LDLDIRECT in the last 72 hours. Thyroid Function Tests: No results for  input(s): TSH, T4TOTAL, FREET4, T3FREE, THYROIDAB in the last 72 hours. Anemia Panel: No results for input(s): VITAMINB12, FOLATE, FERRITIN, TIBC, IRON, RETICCTPCT in the last 72 hours. Urine analysis:    Component Value Date/Time   COLORURINE YELLOW 05/10/2016 2056   APPEARANCEUR HAZY (A) 05/10/2016 2056   LABSPEC 1.008 05/10/2016 2056   LABSPEC 1.005 02/17/2008 1122   PHURINE 5.0 05/10/2016 2056   GLUCOSEU NEGATIVE 05/10/2016 2056   HGBUR NEGATIVE 05/10/2016 2056   HGBUR trace-intact 11/22/2010 0835   BILIRUBINUR NEGATIVE 05/10/2016 2056   BILIRUBINUR negative 02/25/2012 0859   BILIRUBINUR n 09/19/2011 1505   BILIRUBINUR Negative 02/17/2008 [place] 05/10/2016 2056   PROTEINUR NEGATIVE 05/10/2016 2056   UROBILINOGEN 1.0 02/01/2015 1654   NITRITE NEGATIVE 05/10/2016 2056   LEUKOCYTESUR MODERATE (A) 05/10/2016 2056   LEUKOCYTESUR Moderate 02/17/2008 1122   Sepsis Labs: '@LABRCNTIP'$ (procalcitonin:4,lacticidven:4)  )No results found for this or any previous visit (from the past 240 hour(s)).   Radiology Studies:  Dg Chest 2 View  Result Date: 05/10/2016 CLINICAL DATA:  Fever.  History of breast cancer. EXAM: CHEST  2 VIEW COMPARISON:  February 01, 2015 FINDINGS: A right Port-A-Cath is stable. No pneumothorax. The cardiomediastinal silhouette is stable. The left hilum is retracted superiorly but this is a stable finding. No pulmonary nodules or masses. No acute interval changes. IMPRESSION: No active cardiopulmonary disease. Electronically Signed   By: Dorise Bullion III M.D   On: 05/10/2016 18:53    Scheduled Meds: . aztreonam  2 g Intravenous Q8H  . celecoxib  200 mg Oral BID  . cholecalciferol  1,000 Units Oral Once per day on Sun Tue Thu Sat  . darifenacin  15 mg Oral Daily  . gabapentin  300 mg Oral TID  . polyethylene glycol  17 g Oral Daily  . sodium chloride flush  3 mL Intravenous Q12H  . vancomycin  1,250 mg Intravenous Q24H   Continuous  Infusions: . sodium chloride 125 mL/hr at 05/11/16 0216  . heparin 1,000 Units/hr (05/11/16 0216)     LOS: 1 day   Time spent: 25 minutes.  Vance Gather, MD [facility] Pager [phone]  If 7PM-7AM, please contact night-coverage [email or web address] Password [facility] 05/11/2016, 7:54 AM

## 2016-05-12 ENCOUNTER — Ambulatory Visit: Payer: 59 | Admitting: Physical Therapy

## 2016-05-12 DIAGNOSIS — L03115 Cellulitis of right lower limb: Secondary | ICD-10-CM

## 2016-05-12 DIAGNOSIS — I8290 Acute embolism and thrombosis of unspecified vein: Secondary | ICD-10-CM

## 2016-05-12 DIAGNOSIS — M009 Pyogenic arthritis, unspecified: Secondary | ICD-10-CM

## 2016-05-12 LAB — BASIC METABOLIC PANEL
Anion gap: 5 (ref 5–15)
BUN: 12 mg/dL (ref 6–20)
CHLORIDE: 108 mmol/L (ref 101–111)
CO2: 25 mmol/L (ref 22–32)
CREATININE: 0.48 mg/dL (ref 0.44–1.00)
Calcium: 7.9 mg/dL — ABNORMAL LOW (ref 8.9–10.3)
GFR calc Af Amer: >60 mL/min (ref >60)
GFR calc non Af Amer: >60 mL/min (ref >60)
GLUCOSE: 104 mg/dL — AB (ref 65–99)
Potassium: 3.3 mmol/L — ABNORMAL LOW (ref 3.5–5.1)
SODIUM: 138 mmol/L (ref 135–145)

## 2016-05-12 LAB — CBC
HCT: 28.6 % — ABNORMAL LOW (ref 36.0–46.0)
HEMOGLOBIN: 9.4 g/dL — AB (ref 12.0–15.0)
MCH: 30.6 pg (ref 26.0–34.0)
MCHC: 32.9 g/dL (ref 30.0–36.0)
MCV: 93.2 fL (ref 78.0–100.0)
Platelets: 98 10*3/uL — ABNORMAL LOW (ref 150–400)
RBC: 3.07 MIL/uL — ABNORMAL LOW (ref 3.87–5.11)
RDW: 16.8 % — ABNORMAL HIGH (ref 11.5–15.5)
WBC: 9.4 10*3/uL (ref 4.0–10.5)

## 2016-05-12 LAB — GLUCOSE, CAPILLARY: Glucose-Capillary: 132 mg/dL — ABNORMAL HIGH (ref 65–99)

## 2016-05-12 LAB — URINE CULTURE

## 2016-05-12 LAB — SEDIMENTATION RATE: Sed Rate: 68 mm/hr — ABNORMAL HIGH (ref 0–22)

## 2016-05-12 LAB — C-REACTIVE PROTEIN: CRP: 14.2 mg/dL — AB (ref <1.0)

## 2016-05-12 MED ORDER — BACITRACIN ZINC 500 UNIT/GM EX OINT: TOPICAL_OINTMENT | Freq: Two times a day (BID) | CUTANEOUS | 0 refills | Status: DC

## 2016-05-12 MED ORDER — HEPARIN SOD (PORK) LOCK FLUSH 100 UNIT/ML IV SOLN
500.0000 [IU] | INTRAVENOUS | Status: AC | PRN
  Administered 2016-05-12: 500 [IU]

## 2016-05-12 MED ORDER — ACETAMINOPHEN 325 MG PO TABS: 650.0000 mg | ORAL_TABLET | Freq: Four times a day (QID) | ORAL | 0 refills | Status: DC | PRN

## 2016-05-12 MED ORDER — CIPROFLOXACIN HCL 500 MG PO TABS: 500.0000 mg | ORAL_TABLET | Freq: Two times a day (BID) | ORAL | 0 refills | Status: DC

## 2016-05-12 NOTE — Discharge Summary (Signed)
Physician Discharge Summary  [name] [id number] DOB: 11-Nov-1955 DOA: 05/10/2016  PCP: Annye Asa, MD  Admit date: 05/10/2016 Discharge date: 05/12/2016  Recommendations for Outpatient Follow-up:  1. Cipro on discharge for 7 days for UTI.  Discharge Diagnoses:  Principal Problem:   Infection of right knee [facility]) Active Problems:   Breast cancer, left breast (HCC)   Chronic anticoagulation   Sepsis ([place])   Blood clot in vein   UTI (urinary tract infection)   Cellulitis of right knee    Discharge Condition: stable; patient insists on going home today. She is aware that blood cultures are not yet back. Orthopedic surgery said it was fine from their standpoint and in regards to antibiotic treatment to treat for UTI otherwise stable from their standpoint.  Diet recommendation: as tolerated   History of present illness:   Per HPI "60 y.o. female with medical history significant of breast cancer (s/p of bilateral mastectomy, on chemotherapy in [facility] currently), blood clot in the vein of Port-A-Cath (on Eliquis), who presents with fever, chills, right knee infection and increased urinary frequency. Patient states that he is having fever, chills, body aches today. He had mild right maxillary sinus pressure and mild runny nose which has resolved. No sore throat. She had nausea and vomited once. No abdominal pain or diarrhea. Patient denies chest pain, cough, shortness of breath. Patient states that she had a knee replacement in October, which was complicated by wound dehiscence required second surgery. She continues to have a small amount of drainage from the wound. He left the knee is swelling, erythematous and mild warm. She has minimal pain in that knee. She was seen in urgent care and referred to the emergency Department due to orthostasis. Patient states she is getting lightheaded when she is standing up. She also has increased urinary frequency, but no dysuria or burning on  urination. ED Course: pt was found to have WBC 14.7, lactate is 1.53, and 1.32, posterior urinalysis with more GLUCOSE, sodium 132, creatinine normal, negative chest x-ray, temperature 99.8, tachycardia, tachypnea, oxygen saturation 93% on room air. Blood pressures are soft and 93/52. Patient is admitted to tele bed as inpatient. Orthopedic surgeon, Dr. Veverly Fells was consulted by [facility]."  Hospital Course:   Infection of right knee and sepsis - Spoke with orthopedic surgery, Dr. Tonita Cong, no specific recommendations for abx in regards to knee from their standpoint - Dressing done in hospital  - Pt will continue cipro for 7 days on discharge which actually is for urinary tract infection.  - Blood cultures still pending, will be followed up on outpatient basis  UTI - Insignificant growth on urine culture, patient will continue empiric Cipro for 7 more days on discharge  Blood clot in vein - May resume Eliquis   Breast cancer - S/p of chemotherapy and bilateral mastectomy. Patient has been followed up by oncology, Dr. Philipp Ovens in [place]. She is getting every 3 week chemotherapy, last dose was 2 weeks ago. - Follow-up with Dr. Philipp Ovens  Signed:  Leisa Lenz, MD  [facility] 05/12/2016, 1:59 PM  Pager #: [phone]  Time spent in minutes: less than 30 minutes   Discharge Exam: Vitals:   05/11/16 2115 05/12/16 0458  BP: (!) 120/56 134/63  Pulse: (!) 103 (!) 102  Resp: 18 18  Temp: 98.7 F (37.1 C) 98.7 F (37.1 C)   Vitals:   05/11/16 1433 05/11/16 1859 05/11/16 2115 05/12/16 0458  BP: (!) 110/44  (!) 120/56 134/63  Pulse: [name])  111  (!) 103 (!) 102  Resp: 18  18 18   Temp: 100 F (37.8 C) 99.1 F (37.3 C) 98.7 F (37.1 C) 98.7 F (37.1 C)  TempSrc: Oral Oral Oral Oral  SpO2: 98%  98% 96%  Weight:      Height:        General: Pt is alert, follows commands appropriately, not in acute distress Cardiovascular: Regular rate and rhythm, S1/S2 + Respiratory: Clear to  auscultation bilaterally, no wheezing, no crackles, no rhonchi Abdominal: Soft, non tender, non distended, bowel sounds +, no guarding Extremities: dressing in place left knee, no cyanosis, pulses palpable bilaterally DP and PT Neuro: Grossly nonfocal  Discharge Instructions  Discharge Instructions    Call MD for:  persistant nausea and vomiting    Complete by:  As directed    Call MD for:  redness, tenderness, or signs of infection (pain, swelling, redness, odor or green/yellow discharge around incision site)    Complete by:  As directed    Call MD for:  severe uncontrolled pain    Complete by:  As directed    Diet - low sodium heart healthy    Complete by:  As directed    Discharge instructions    Complete by:  As directed    Continue Cipro for 7 more days on discharge for urinary tract infection.   Increase activity slowly    Complete by:  As directed      Allergies as of 05/12/2016      Reactions   Tape Hives   Can tolerate paper tape   Other Rash   STERI STRIPS - Blisters   Penicillins Rash   Denies airway involvement Has patient had a PCN reaction causing immediate rash, facial/tongue/throat swelling, SOB or lightheadedness with hypotension: No Has patient had a PCN reaction causing severe rash involving mucus membranes or skin necrosis: No Has patient had a PCN reaction that required hospitalization No Has patient had a PCN reaction occurring within the last 10 years: No If all of the above answers are "NO", then may proceed with Cephalosporin use.      Medication List    STOP taking these medications   cephALEXin 500 MG capsule Commonly known as:  KEFLEX   methocarbamol 500 MG tablet Commonly known as:  ROBAXIN   oxyCODONE-acetaminophen 5-325 MG tablet Commonly known as:  PERCOCET   traMADol 50 MG tablet Commonly known as:  ULTRAM     TAKE these medications   acetaminophen 325 MG tablet Commonly known as:  TYLENOL Take 2 tablets (650 mg total) by mouth  every 6 (six) hours as needed for mild pain (or Fever >/= 101).   apixaban 5 MG Tabs tablet Commonly known as:  ELIQUIS Take 5 mg by mouth 2 (two) times daily. Will stop prior to proceure   bacitracin ointment Apply topically 2 (two) times daily.   celecoxib 200 MG capsule Commonly known as:  CELEBREX TAKE 1 CAPSULE BY MOUTH TWICE DAILY What changed:  See the new instructions.   ciprofloxacin 500 MG tablet Commonly known as:  CIPRO Take 1 tablet (500 mg total) by mouth 2 (two) times daily.   darifenacin 15 MG 24 hr tablet Commonly known as:  ENABLEX Take 15 mg by mouth daily.   docusate sodium 100 MG capsule Commonly known as:  COLACE Take 1 capsule (100 mg total) by mouth 2 (two) times daily as needed for mild constipation.   gabapentin 300 MG capsule Commonly known as:  NEURONTIN TAKE 1 CAPSULE BY MOUTH 3 TIMES DAILY   ondansetron 8 MG tablet Commonly known as:  ZOFRAN Take 1 tablet (8 mg total) by mouth every 8 (eight) hours as needed for nausea.   polyethylene glycol packet Commonly known as:  MIRALAX / GLYCOLAX Take 17 g by mouth daily.   Vitamin D3 2000 units Tabs Take 1 tablet by mouth 4 (four) times a week.   zolpidem 5 MG tablet Commonly known as:  AMBIEN TAKE 1 TABLET BY MOUTH AT BEDTIME AS NEEDED FOR SLEEP       Follow-up Information    Annye Asa, MD. Schedule an appointment as soon as possible for a visit in 1 week(s).   Specialty:  Family Medicine Contact information: [address] [place] [address] [phone]            The results of significant diagnostics from this hospitalization (including imaging, microbiology, ancillary and laboratory) are listed below for reference.    Significant Diagnostic Studies: Dg Chest 2 View  Result Date: 05/10/2016 CLINICAL DATA:  Fever.  History of breast cancer. EXAM: CHEST  2 VIEW COMPARISON:  February 01, 2015 FINDINGS: A right Port-A-Cath is stable. No pneumothorax. The  cardiomediastinal silhouette is stable. The left hilum is retracted superiorly but this is a stable finding. No pulmonary nodules or masses. No acute interval changes. IMPRESSION: No active cardiopulmonary disease. Electronically Signed   By: Dorise Bullion III M.D   On: 05/10/2016 18:53    Microbiology: Recent Results (from the past 240 hour(s))  Urine culture     Status: Abnormal   Collection Time: 05/10/16  8:56 PM  Result Value Ref Range Status   Specimen Description URINE, CLEAN CATCH  Final   Special Requests NONE  Final   Culture (A)  Final    <10,000 COLONIES/mL INSIGNIFICANT GROWTH Performed at [facility]    Report Status 05/12/2016 FINAL  Final     Labs: Basic Metabolic Panel:  Recent Labs Lab 05/10/16 1850 05/11/16 0145 05/12/16 0312  NA 132* 134* 138  K 3.9 3.5 3.3*  CL 98* 104 108  CO2 23 24 25   GLUCOSE 99 116* 104*  BUN 20 17 12   CREATININE 1.00 0.63 0.48  CALCIUM 8.7* 8.1* 7.9*   Liver Function Tests:  Recent Labs Lab 05/10/16 1850  AST 101*  ALT 57*  ALKPHOS 138*  BILITOT 1.7*  PROT 7.4  ALBUMIN 3.7   No results for input(s): LIPASE, AMYLASE in the last 168 hours. No results for input(s): AMMONIA in the last 168 hours. CBC:  Recent Labs Lab 05/10/16 1850 05/11/16 0145 05/12/16 0312  WBC 14.7* 16.6* 9.4  NEUTROABS 13.1*  --   --   HGB 11.8* 10.1* 9.4*  HCT 35.7* 30.6* 28.6*  MCV 91.5 92.2 93.2  PLT 114* 93* 98*   Cardiac Enzymes: No results for input(s): CKTOTAL, CKMB, CKMBINDEX, TROPONINI in the last 168 hours. BNP: BNP (last 3 results) No results for input(s): BNP in the last 8760 hours.  ProBNP (last 3 results) No results for input(s): PROBNP in the last 8760 hours.  CBG:  Recent Labs Lab 05/11/16 0813 05/12/16 0751  GLUCAP 88 132*

## 2016-05-12 NOTE — Progress Notes (Signed)
Pt reports she has had 6 stools today.  The first one was medium, brown soft.  The other 5 were small, loose and brown.   Have notified K. Schorr, on call, to ask if she would like a specimen sent to check for c-diff.   Awaiting a response.

## 2016-05-12 NOTE — Progress Notes (Signed)
Pt has not had any stools on this shift.

## 2016-05-12 NOTE — Discharge Instructions (Signed)
Urinary Tract Infection, Adult Introduction A urinary tract infection (UTI) is an infection of any part of the urinary tract. The urinary tract includes the:  Kidneys.  Ureters.  Bladder.  Urethra. These organs make, store, and get rid of pee (urine) in the body. Follow these instructions at home:  Take over-the-counter and prescription medicines only as told by your doctor.  If you were prescribed an antibiotic medicine, take it as told by your doctor. Do not stop taking the antibiotic even if you start to feel better.  Avoid the following drinks:  Alcohol.  Caffeine.  Tea.  Carbonated drinks.  Drink enough fluid to keep your pee clear or pale yellow.  Keep all follow-up visits as told by your doctor. This is important.  Make sure to:  Empty your bladder often and completely. Do not to hold pee for long periods of time.  Empty your bladder before and after sex.  Wipe from front to back after a bowel movement if you are female. Use each tissue one time when you wipe. Contact a doctor if:  You have back pain.  You have a fever.  You feel sick to your stomach (nauseous).  You throw up (vomit).  Your symptoms do not get better after 3 days.  Your symptoms go away and then come back. Get help right away if:  You have very bad back pain.  You have very bad lower belly (abdominal) pain.  You are throwing up and cannot keep down any medicines or water. This information is not intended to replace advice given to you by your health care provider. Make sure you discuss any questions you have with your health care provider. Document Released: 10/29/2007 Document Revised: 10/18/2015 Document Reviewed: 04/02/2015  2017 Elsevier  

## 2016-05-13 ENCOUNTER — Telehealth: Payer: Self-pay

## 2016-05-13 NOTE — Telephone Encounter (Signed)
Transition Care Management Follow-up Telephone Call   Date discharged? 05/12/16   How have you been since you were released from the hospital? "okay. Whenever I have a temperature, I'll take Motrin or Tylenol. I've been using my incentive spirometer every hour and I do leg pumps while I'm in bed"   Do you understand why you were in the hospital? yes, "UTI, my incision isn't closed"   Do you understand the discharge instructions? yes   Where were you discharged to? Home   Items Reviewed:  Medications reviewed: discontinued pain meds. Patient states she will continue Tramadol for knee pain.   Allergies reviewed: yes  Dietary changes reviewed: no changes  Referrals reviewed: no referral   Functional Questionnaire:   Activities of Daily Living (ADLs):   She states they are independent in the following: ambulation, bathing and hygiene, feeding, continence, grooming, toileting and dressing with little assistance from spouse today only.  States they require assistance with the following: none.   Any transportation issues/concerns?: no   Any patient concerns? no   Confirmed importance and date/time of follow-up visits scheduled yes  Provider Appointment booked with PCP 05/21/16 @ 11.   Confirmed with patient if condition begins to worsen call PCP or go to the ER.  Patient was given the office number and encouraged to call back with question or concerns.  : yes

## 2016-05-14 ENCOUNTER — Encounter: Payer: Self-pay | Admitting: Physical Therapy

## 2016-05-16 ENCOUNTER — Encounter: Payer: Self-pay | Admitting: Physical Therapy

## 2016-05-16 LAB — CULTURE, BLOOD (ROUTINE X 2)
CULTURE: NO GROWTH
Culture: NO GROWTH

## 2016-05-21 ENCOUNTER — Encounter: Payer: Self-pay | Admitting: Physical Therapy

## 2016-05-21 ENCOUNTER — Ambulatory Visit (INDEPENDENT_AMBULATORY_CARE_PROVIDER_SITE_OTHER): Payer: 59 | Admitting: Family Medicine

## 2016-05-21 ENCOUNTER — Encounter: Payer: Self-pay | Admitting: Family Medicine

## 2016-05-21 VITALS — BP 102/65 | HR 89 | Temp 99.4°F | Resp 16 | Ht 67.0 in | Wt 155.1 lb

## 2016-05-21 DIAGNOSIS — M009 Pyogenic arthritis, unspecified: Secondary | ICD-10-CM

## 2016-05-21 MED ORDER — CIPROFLOXACIN HCL 500 MG PO TABS: 500.0000 mg | ORAL_TABLET | Freq: Two times a day (BID) | ORAL | 0 refills | Status: DC

## 2016-05-21 NOTE — Patient Instructions (Signed)
Follow up w/ Dr Tonita Cong as scheduled tomorrow Restart the Cipro twice daily- take w/ food Continue to drink plenty of fluids REST!!! Change positions slowly to prevent dizziness and falls! Call with any questions or concerns Hang in there!!! Happy New Year!!!

## 2016-05-21 NOTE — Progress Notes (Signed)
   Subjective:    Patient ID: [name], female    DOB: 1956/01/16, 60 y.o.   MRN: [id number]  [facility] f/u- pt was admitted 12/16-18 w/ R knee cellulitis, UTI and sepsis.  She was d/c'd on 7 days of Cipro and completed this on 12/25.  She had a normal CXR, (-) blood cxs, WBC at time of d/c was WNL after peaking at 16.6  Reviewed d/c summary, labs, imaging, and med rec was performed.  Pt reports knee redness has improved w/ abx.  Pt will have intermittent fevers, Tm 101.  No longer having dizziness.   Review of Systems For ROS see HPI     Objective:   Physical Exam  Constitutional: She is oriented to person, place, and time. She appears well-developed and well-nourished. No distress.  HENT:  Head: Normocephalic and atraumatic.  Eyes: Conjunctivae and EOM are normal. Pupils are equal, round, and reactive to light.  Neck: Normal range of motion. Neck supple. No thyromegaly present.  Cardiovascular: Normal rate, regular rhythm, normal heart sounds and intact distal pulses.   No murmur heard. Pulmonary/Chest: Effort normal and breath sounds normal. No respiratory distress.  Musculoskeletal: She exhibits edema (swelling of R knee w/ 2 open, draining wounds anteriorly  + redness and warmth) and tenderness (R knee).  Lymphadenopathy:    She has no cervical adenopathy.  Neurological: She is alert and oriented to person, place, and time.  Skin: Skin is warm and dry. There is erythema (R knee).  Psychiatric: She has a normal mood and affect. Her behavior is normal.  Vitals reviewed.         Assessment & Plan:

## 2016-05-21 NOTE — Progress Notes (Signed)
Pre visit review using our clinic review tool, if applicable. No additional management support is needed unless otherwise documented below in the visit note. 

## 2016-05-21 NOTE — Assessment & Plan Note (Signed)
New to provider, ongoing for pt.  There is still evidence of active infxn of R leg- draining wound, fevers, redness, warmth.  Will restart Cipro as pt feels cellulitis was improving.  Pt given 7 more days of abx (for a total of 14 days) but may need a longer course.  Reviewed supportive care and red flags that should prompt return.  Pt expressed understanding and is in agreement w/ plan.

## 2016-05-22 ENCOUNTER — Ambulatory Visit: Payer: 59

## 2016-05-23 ENCOUNTER — Other Ambulatory Visit: Payer: Self-pay | Admitting: General Practice

## 2016-05-23 DIAGNOSIS — Z171 Estrogen receptor negative status [ER-]: Secondary | ICD-10-CM | POA: Diagnosis not present

## 2016-05-23 DIAGNOSIS — C50412 Malignant neoplasm of upper-outer quadrant of left female breast: Secondary | ICD-10-CM | POA: Diagnosis not present

## 2016-05-23 DIAGNOSIS — Z5112 Encounter for antineoplastic immunotherapy: Secondary | ICD-10-CM | POA: Diagnosis not present

## 2016-05-23 DIAGNOSIS — C792 Secondary malignant neoplasm of skin: Secondary | ICD-10-CM | POA: Diagnosis not present

## 2016-05-23 MED ORDER — ONDANSETRON HCL 8 MG PO TABS: 8.0000 mg | ORAL_TABLET | Freq: Three times a day (TID) | ORAL | 6 refills | Status: DC | PRN

## 2016-05-23 MED FILL — ZOLPIDEM TARTRATE 5 MG TAB: 5 | 30 days supply | Qty: 30 | Fill #2

## 2016-05-23 MED FILL — ONDANSETRON HCL 8 MG TABLET: 8 | 7 days supply | Qty: 20 | Fill #0

## 2016-05-27 ENCOUNTER — Ambulatory Visit: Payer: Self-pay | Admitting: Orthopedic Surgery

## 2016-05-27 ENCOUNTER — Encounter (HOSPITAL_COMMUNITY): Payer: Self-pay | Admitting: *Deleted

## 2016-05-27 NOTE — H&P (Signed)
[name] is an 61 y.o. female.   Chief Complaint: R knee wound drainage HPI: [name] follows up today for her right knee. She is now nine weeks out from total knee replacement, five weeks and five days out from I and D and repair of wound dehiscence. Since the last time that she was here on December 5th, at which point we had taken out her sutures, she had a readmission to the hospital over at [facility] for possible UTIs as well as may be some cellulitis at that incision. She states when we took the sutures out, the incision was fully healed a week or may be a week and a half after that. She noticed it started to open up. There was a spot at both the proximal and distal aspect of the incision. The part of the proximal aspect fully healed itself. At the distal aspect, she has two small openings that are connected that were evaluated in the hospital and she was readmitted on the 16th. She was seen by Dr. Veverly Fells and follow up in the hospital. She was also seen by Dr. Alvan Dame. They thought that may be some local cellulitis, but no findings consistent with a deep infection. She was then admitted on the Internal Medicine Service and her symptoms improved with Cipro. Therefore, they discharged her on that. She was off that for may be one or two days, followed up with her PCP yesterday, December 27th, and she was placed back on the Cipro for another week. Although her cultures were negative for a UTI, she did have moderate leukocytes, but no growth. Her PCP really put her back on the Cipro for the knee as that was still open and draining. She on and off had some increase in her temperature, never over 100. She does take Tylenol as needed for that. Otherwise, she has been feeling fine. When she first went back to the hospital on the 16th, she thought maybe she had some type of viral syndrome going on. She was not feeling well, maybe some flu like symptoms, did vomit prior to going to the hospital. She has not been to  that point since being out. While she was off the Cipro for the few days, she did not notice any change in her symptoms. No increase in the redness or the size or the drainage. She has been doing dressing changes twice a day, placing bacitracin as directed by the wound care from the hospital in the opening and keeping it covered with gauze. Reports a serosanguineous type of drainage. Most of her pain is actually not at the opening of the incision, it is medial which she was having when she had the dehiscence. She has been taking tramadol as needed, asking for refill of that, has been icing and elevating which does seem to help. She has chemo again tomorrow. She is on a three-week cycle for that. Reports otherwise she is feeling okay.  Past Medical History:  Diagnosis Date  . 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 Arthritis KNEES   right knee, hx. past left knee replacemnt.  . Blood clot in vein    around Portacath right chest-tx. Eliquis about a yr., prior Lovenox.  . Chronic anticoagulation HX  PORT-A-CATH CLOT   2010 - HAS BEEN ON LOVENOX SINCE THE CLOT  . Elevated blood pressure reading without diagnosis of hypertension    PT MONITORS HER B/P AT HOME  . Heart murmur   . 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 PONV (postoperative nausea and vomiting)   . Skin cancer of anterior chest BREAST CANCER PRIMARY W/ METS TO CHEST WALL SKIN CANCER--  CHEMOTHERAPY EVERY 3 WEEKS AT [facility]  . Status post skin flap graft    right chest wall with metastatis right anterior chest -no drainage or open wound.    Past Surgical History:  Procedure Laterality Date  . CHEST WALL TUMOR EXCISION    . hemotoma     evacuation left chest wall  . IRRIGATION AND DEBRIDEMENT KNEE Right 04/12/2016   Procedure: IRRIGATION AND DEBRIDEMENT KNEE;  Surgeon: Nicholes Stairs, MD;   Location: [facility];  Service: Orthopedics;  Laterality: Right;  . KNEE ARTHROSCOPY  02/13/2012   Procedure: ARTHROSCOPY KNEE;  Surgeon: Johnn Hai, MD;  Location[facility];  Service: Orthopedics;  Laterality: Left;  WITH DEBRIDEMENt   . KNEE ARTHROSCOPY WITH LATERAL MENISECTOMY  02/13/2012   Procedure: KNEE ARTHROSCOPY WITH LATERAL MENISECTOMY;  Surgeon: Johnn Hai, MD;  Location: [place];  Service: Orthopedics;;  partial  . LEFT MODIFIED RADICAL MASTECTOMY/ RIGHT TOTAL MASTECTOMY  11-13-2007   LEFT BREAST CANCER W/ AXILLARY LYMPH NODE METASTASIS AND POST NEOADJUVANT CHEMO  . PLACEMENT PORT-A-CATH  06/22/2007   right chest  . SKIN GRAFT     chest-post tumor removal, second occurrence of cancer" 2015 surgery for Flap 2015"  . TOTAL KNEE ARTHROPLASTY Left 09/23/2012   Procedure: LEFT TOTAL KNEE ARTHROPLASTY;  Surgeon: Johnn Hai, MD;  Location: [facility];  Service: Orthopedics;  Laterality: Left;  . TOTAL KNEE ARTHROPLASTY Right 03/20/2016   Procedure: RIGHT TOTAL KNEE ARTHROPLASTY;  Surgeon: Susa Day, MD;  Location: [facility];  Service: Orthopedics;  Laterality: Right;  . TRANSTHORACIC ECHOCARDIOGRAM  11-18-2011  DR BENSIMHON (ECHO EVERY 3 MONTHS)   HX CHEMO INDUCED CARDIOTOXICITY/  LVSF NORMAL/ EF 55-50%/ MILD MITRIAL VALVE REGURG./ MILDLY INCREASED SYSTOLIC PRESSURE OF PULMONARY ARTERIES    Family History  Problem Relation Age of Onset  . Heart disease Mother     due to mitral valve regurgiation,   . Cancer Mother     breast  . Parkinsonism Father   . Alcohol abuse Paternal Grandfather    Social History:  reports that she has never smoked. She has never used smokeless tobacco. She reports that she does not drink alcohol or use drugs.  Allergies:  Allergies  Allergen Reactions  . Tape Hives    Can tolerate paper tape  . Other Rash    STERI STRIPS - Blisters  . Penicillins Rash    Denies airway involvement Has patient had a PCN  reaction causing immediate rash, facial/tongue/throat swelling, SOB or lightheadedness with hypotension: No Has patient had a PCN reaction causing severe rash involving mucus membranes or skin necrosis: No Has patient had a PCN reaction that required hospitalization No Has patient had a PCN reaction occurring within the last 10 years: No If all of the above answers are "NO", then may proceed with Cephalosporin use.       (Not in a hospital admission)  No results found for this or any previous visit (from the past 48 hour(s)). No results found.  Review of Systems  Constitutional: Positive for chills, fever and weight loss.  HENT: Negative.   Eyes: Negative.   Respiratory: Negative.  Cardiovascular: Negative.   Gastrointestinal: Negative.   Genitourinary: Negative.   Musculoskeletal: Positive for joint pain.  Skin: Positive for rash.  Neurological: Negative.     Last menstrual period 06/22/2007. Physical Exam  Constitutional: She is oriented to person, place, and time. She appears well-developed and well-nourished.  HENT:  Head: Normocephalic.  Eyes: Pupils are equal, round, and reactive to light.  Neck: Normal range of motion.  Cardiovascular: Normal rate.   Respiratory: Effort normal.  GI: Soft.  Musculoskeletal:  On exam today of right knee, proximal portion of the incision is well healed. At the distal third of the incision, there are two openings noted that are connected. There is a small bridge of skin between the two. The larger of the two is approximately dime sized. The smaller is about 0.5 cm in diameter. There is serosanguinous drainage there today which I am able to express onto a piece of gauze. There appears to be a small amount of granulation tissue as well as some necrotic tissue in the base of that. Dr. Tonita Cong did debride that with hydrogen peroxide today. No active drainage following that. We placed plain packing in into the opening and redressed that with a dry  piece of gauze and then an Ace bandage. No increased lower extremity edema. She has some very slight redness at the medial aspect of the knee. This is actually the same as when I last saw her about three weeks ago. No increase in that being off the antibiotics. No calf pain or sign of DVT.  Neurological: She is alert and oriented to person, place, and time.    X-rays from December 5th reviewed with total knee prosthesis in excellent alignment with no signs of osteolysis or loosening.  Assessment/Plan Right knee nine weeks status post total knee replacement, five weeks five days out from I and D and repair of wound dehiscence, nearly two weeks out from re-admission for possible UTI with negative cultures as well as some cellulitis at the right knee with no evidence of deep infection. Also to be noted, her blood cultures x2 were negative from that re-admission and the highest white count was 14. Urine cultures were negative for growth, but had moderate amount of leukocytes.  As noted above, we packed her wound today. She will continue to do dressing changes twice a day with the gauze. Each time she does that she can slightly advance the packing, pulling that out a slight bit each time, avoid the bacitracin at this point. We will have her continue on the antibiotics at this point until this wound is closed. Dr. Tonita Cong did discuss this with Dr. Alvan Dame today and he was in agreement with that plan. He had seen her last time she was in the hospital. We will keep her on the antibiotics until this wound closes and then we can trial her off of them. We will see her in four days Tuesday for a wound check at which point we can place new packing if necessary, plan to do a repeat debridement in the office here of that wound. I would still continue to limit her flexion. She can continue to work on extension as well as some quad strengthening exercises, extension based, ice and elevate for swelling. She is to let us know if  she has any change in her symptoms, any worsening, continue to keep an eye on this and change that dressing twice a day. We will see her back at that point on Tuesday. The  patient was seen in conjunction with Dr. Tonita Cong today. Given ongoing symptoms, pt hx with immunocompromised status, ongoing open wound non-healing despite time on abx and debridement, Dr. Tonita Cong discussed with the patient the day after her visit, proceeding with R I&D, wound vac placement, possible poly exchange. Discussed the procedure itself as well as risks, complications, and alternative options including but not limited to DVT, PE, failure of procedure, need for secondary procedure, anesthesia risk, even death. Discussed delayed healing with her medical hx and prolonged need for a wound vac given that to ensure proper healing. All her questions were answered and she desires to proceed.  Plan Right knee I&D, wound vac placement, possible poly exchange  Cecilie Kicks., PA-C for Dr. Tonita Cong 05/27/2016, 10:18 AM

## 2016-05-27 NOTE — Progress Notes (Signed)
Chest 12/17, ekg 10/17, eccho 10/17 all in epic. States has right sided port a cath

## 2016-05-28 ENCOUNTER — Ambulatory Visit (HOSPITAL_COMMUNITY): Payer: 59 | Admitting: Certified Registered Nurse Anesthetist

## 2016-05-28 ENCOUNTER — Ambulatory Visit: Payer: 59 | Attending: Specialist

## 2016-05-28 ENCOUNTER — Encounter (HOSPITAL_COMMUNITY): Payer: Self-pay | Admitting: *Deleted

## 2016-05-28 ENCOUNTER — Inpatient Hospital Stay (HOSPITAL_COMMUNITY)
Admission: AD | Admit: 2016-05-28 | Discharge: 2016-06-03 | DRG: 486 | Disposition: A | Discharge status: Home / Self Care | Payer: 59 | Source: Ambulatory Visit | Attending: Specialist | Admitting: Specialist

## 2016-05-28 ENCOUNTER — Encounter (HOSPITAL_COMMUNITY)
Admission: AD | Disposition: A | Discharge status: Home / Self Care | Payer: Self-pay | Source: Ambulatory Visit | Attending: Specialist

## 2016-05-28 DIAGNOSIS — Z853 Personal history of malignant neoplasm of breast: Secondary | ICD-10-CM

## 2016-05-28 DIAGNOSIS — C7989 Secondary malignant neoplasm of other specified sites: Secondary | ICD-10-CM | POA: Diagnosis not present

## 2016-05-28 DIAGNOSIS — Z888 Allergy status to other drugs, medicaments and biological substances status: Secondary | ICD-10-CM

## 2016-05-28 DIAGNOSIS — D649 Anemia, unspecified: Secondary | ICD-10-CM | POA: Diagnosis not present

## 2016-05-28 DIAGNOSIS — L03115 Cellulitis of right lower limb: Secondary | ICD-10-CM | POA: Diagnosis not present

## 2016-05-28 DIAGNOSIS — B952 Enterococcus as the cause of diseases classified elsewhere: Secondary | ICD-10-CM | POA: Diagnosis not present

## 2016-05-28 DIAGNOSIS — Z96651 Presence of right artificial knee joint: Secondary | ICD-10-CM | POA: Diagnosis present

## 2016-05-28 DIAGNOSIS — Z9013 Acquired absence of bilateral breasts and nipples: Secondary | ICD-10-CM

## 2016-05-28 DIAGNOSIS — Z91048 Other nonmedicinal substance allergy status: Secondary | ICD-10-CM

## 2016-05-28 DIAGNOSIS — T8131XA Disruption of external operation (surgical) wound, not elsewhere classified, initial encounter: Secondary | ICD-10-CM | POA: Diagnosis not present

## 2016-05-28 DIAGNOSIS — Z880 Allergy status to penicillin: Secondary | ICD-10-CM | POA: Diagnosis not present

## 2016-05-28 DIAGNOSIS — R21 Rash and other nonspecific skin eruption: Secondary | ICD-10-CM | POA: Diagnosis present

## 2016-05-28 DIAGNOSIS — R2681 Unsteadiness on feet: Secondary | ICD-10-CM

## 2016-05-28 DIAGNOSIS — T8453XA Infection and inflammatory reaction due to internal right knee prosthesis, initial encounter: Secondary | ICD-10-CM | POA: Diagnosis not present

## 2016-05-28 DIAGNOSIS — Z809 Family history of malignant neoplasm, unspecified: Secondary | ICD-10-CM

## 2016-05-28 DIAGNOSIS — W109XXD Fall (on) (from) unspecified stairs and steps, subsequent encounter: Secondary | ICD-10-CM | POA: Diagnosis present

## 2016-05-28 DIAGNOSIS — Z9221 Personal history of antineoplastic chemotherapy: Secondary | ICD-10-CM | POA: Diagnosis not present

## 2016-05-28 DIAGNOSIS — C773 Secondary and unspecified malignant neoplasm of axilla and upper limb lymph nodes: Secondary | ICD-10-CM | POA: Diagnosis not present

## 2016-05-28 DIAGNOSIS — Z7901 Long term (current) use of anticoagulants: Secondary | ICD-10-CM

## 2016-05-28 DIAGNOSIS — N390 Urinary tract infection, site not specified: Secondary | ICD-10-CM | POA: Diagnosis present

## 2016-05-28 DIAGNOSIS — C50912 Malignant neoplasm of unspecified site of left female breast: Secondary | ICD-10-CM | POA: Diagnosis not present

## 2016-05-28 DIAGNOSIS — M1711 Unilateral primary osteoarthritis, right knee: Secondary | ICD-10-CM | POA: Diagnosis present

## 2016-05-28 DIAGNOSIS — T8450XS Infection and inflammatory reaction due to unspecified internal joint prosthesis, sequela: Secondary | ICD-10-CM

## 2016-05-28 DIAGNOSIS — M009 Pyogenic arthritis, unspecified: Secondary | ICD-10-CM | POA: Diagnosis present

## 2016-05-28 DIAGNOSIS — Z85828 Personal history of other malignant neoplasm of skin: Secondary | ICD-10-CM

## 2016-05-28 DIAGNOSIS — T814XXA Infection following a procedure, initial encounter: Secondary | ICD-10-CM | POA: Diagnosis not present

## 2016-05-28 DIAGNOSIS — T8189XA Other complications of procedures, not elsewhere classified, initial encounter: Secondary | ICD-10-CM

## 2016-05-28 DIAGNOSIS — M869 Osteomyelitis, unspecified: Secondary | ICD-10-CM | POA: Diagnosis not present

## 2016-05-28 HISTORY — PX: I & D KNEE WITH POLY EXCHANGE: SHX5024

## 2016-05-28 LAB — SYNOVIAL CELL COUNT + DIFF, W/ CRYSTALS
Crystals, Fluid: NONE SEEN
Lymphocytes-Synovial Fld: 3 % (ref 0–20)
MONOCYTE-MACROPHAGE-SYNOVIAL FLUID: 6 % — AB (ref 50–90)
NEUTROPHIL, SYNOVIAL: 91 % — AB (ref 0–25)
WBC, Synovial: 13650 /mm3 — ABNORMAL HIGH (ref 0–200)

## 2016-05-28 LAB — PROTIME-INR
INR: 1.06
Prothrombin Time: 13.8 seconds (ref 11.4–15.2)

## 2016-05-28 LAB — TYPE AND SCREEN
ABO/RH(D): A POS
Antibody Screen: NEGATIVE

## 2016-05-28 LAB — APTT: aPTT: 38 seconds — ABNORMAL HIGH (ref 24–36)

## 2016-05-28 LAB — SURGICAL PCR SCREEN
MRSA, PCR: NEGATIVE
STAPHYLOCOCCUS AUREUS: NEGATIVE

## 2016-05-28 SURGERY — IRRIGATION AND DEBRIDEMENT KNEE WITH POLY EXCHANGE
Anesthesia: General | Site: Knee | Laterality: Right

## 2016-05-28 MED ORDER — CIPROFLOXACIN HCL 500 MG PO TABS: 500.0000 mg | ORAL_TABLET | Freq: Two times a day (BID) | ORAL | Status: DC

## 2016-05-28 MED ORDER — ONDANSETRON HCL 4 MG/2ML IJ SOLN
INTRAMUSCULAR | Status: DC | PRN
  Administered 2016-05-28: 4 mg via INTRAVENOUS

## 2016-05-28 MED ORDER — VANCOMYCIN HCL IN DEXTROSE 1-5 GM/200ML-% IV SOLN
1000.0000 mg | INTRAVENOUS | Status: DC
  Filled 2016-05-28: qty 200

## 2016-05-28 MED ORDER — DEXAMETHASONE SODIUM PHOSPHATE 10 MG/ML IJ SOLN
INTRAMUSCULAR | Status: DC | PRN
  Administered 2016-05-28: 10 mg via INTRAVENOUS

## 2016-05-28 MED ORDER — PROPOFOL 10 MG/ML IV BOLUS
INTRAVENOUS | Status: AC
  Filled 2016-05-28: qty 20

## 2016-05-28 MED ORDER — ACETAMINOPHEN 325 MG PO TABS: 650.0000 mg | ORAL_TABLET | Freq: Four times a day (QID) | ORAL | Status: DC | PRN

## 2016-05-28 MED ORDER — SCOPOLAMINE 1 MG/3DAYS TD PT72
MEDICATED_PATCH | TRANSDERMAL | Status: DC | PRN
  Administered 2016-05-28: 1 via TRANSDERMAL

## 2016-05-28 MED ORDER — MIDAZOLAM HCL 5 MG/5ML IJ SOLN
INTRAMUSCULAR | Status: DC | PRN
  Administered 2016-05-28: 2 mg via INTRAVENOUS

## 2016-05-28 MED ORDER — LIDOCAINE 2% (20 MG/ML) 5 ML SYRINGE
INTRAMUSCULAR | Status: AC
  Filled 2016-05-28: qty 5

## 2016-05-28 MED ORDER — CEFAZOLIN SODIUM-DEXTROSE 2-3 GM-% IV SOLR
2.0000 g | INTRAVENOUS | Status: AC
  Administered 2016-05-28: 2 g via INTRAVENOUS

## 2016-05-28 MED ORDER — CEFAZOLIN SODIUM-DEXTROSE 2-4 GM/100ML-% IV SOLN
INTRAVENOUS | Status: AC
  Filled 2016-05-28: qty 100

## 2016-05-28 MED ORDER — DOCUSATE SODIUM 100 MG PO CAPS
100.0000 mg | ORAL_CAPSULE | Freq: Two times a day (BID) | ORAL | Status: DC | PRN
  Administered 2016-05-31 – 2016-06-02 (×2): 100 mg via ORAL
  Filled 2016-05-28 (×2): qty 1

## 2016-05-28 MED ORDER — SODIUM CHLORIDE 0.9 % IR SOLN
Status: AC
  Filled 2016-05-28: qty 500000

## 2016-05-28 MED ORDER — POLYETHYLENE GLYCOL 3350 17 G PO PACK
17.0000 g | PACK | Freq: Every day | ORAL | Status: DC
  Administered 2016-05-29 – 2016-06-03 (×5): 17 g via ORAL
  Filled 2016-05-28 (×4): qty 1

## 2016-05-28 MED ORDER — FENTANYL CITRATE (PF) 100 MCG/2ML IJ SOLN
INTRAMUSCULAR | Status: DC | PRN
  Administered 2016-05-28 (×2): 25 ug via INTRAVENOUS
  Administered 2016-05-28: 50 ug via INTRAVENOUS
  Administered 2016-05-28 (×2): 25 ug via INTRAVENOUS
  Administered 2016-05-28: 50 ug via INTRAVENOUS

## 2016-05-28 MED ORDER — TRAMADOL HCL 50 MG PO TABS
50.0000 mg | ORAL_TABLET | Freq: Four times a day (QID) | ORAL | Status: DC
  Administered 2016-05-28 – 2016-06-03 (×21): 50 mg via ORAL
  Filled 2016-05-28 (×21): qty 1

## 2016-05-28 MED ORDER — LACTATED RINGERS IV SOLN
INTRAVENOUS | Status: DC
  Administered 2016-05-28 (×2): via INTRAVENOUS

## 2016-05-28 MED ORDER — ONDANSETRON HCL 4 MG/2ML IJ SOLN: 4.0000 mg | Freq: Four times a day (QID) | INTRAMUSCULAR | Status: DC | PRN

## 2016-05-28 MED ORDER — OXYCODONE HCL 5 MG PO TABS
5.0000 mg | ORAL_TABLET | ORAL | Status: DC | PRN
  Administered 2016-05-29 – 2016-06-03 (×11): 5 mg via ORAL
  Filled 2016-05-28 (×9): qty 1
  Filled 2016-05-28: qty 2
  Filled 2016-05-28: qty 1

## 2016-05-28 MED ORDER — GABAPENTIN 300 MG PO CAPS
300.0000 mg | ORAL_CAPSULE | Freq: Three times a day (TID) | ORAL | Status: DC
  Administered 2016-05-28 – 2016-06-03 (×15): 300 mg via ORAL
  Filled 2016-05-28 (×15): qty 1

## 2016-05-28 MED ORDER — VITAMIN D3 25 MCG (1000 UNIT) PO TABS
2000.0000 [IU] | ORAL_TABLET | ORAL | Status: DC
  Administered 2016-05-29 – 2016-06-03 (×4): 2000 [IU] via ORAL
  Filled 2016-05-28 (×5): qty 2

## 2016-05-28 MED ORDER — ONDANSETRON HCL 8 MG PO TABS: 8.0000 mg | ORAL_TABLET | Freq: Three times a day (TID) | ORAL | Status: DC | PRN

## 2016-05-28 MED ORDER — LIDOCAINE 2% (20 MG/ML) 5 ML SYRINGE
INTRAMUSCULAR | Status: DC | PRN
  Administered 2016-05-28: 100 mg via INTRAVENOUS

## 2016-05-28 MED ORDER — EPHEDRINE 5 MG/ML INJ
INTRAVENOUS | Status: AC
  Filled 2016-05-28: qty 10

## 2016-05-28 MED ORDER — FENTANYL CITRATE (PF) 100 MCG/2ML IJ SOLN
INTRAMUSCULAR | Status: AC
  Filled 2016-05-28: qty 4

## 2016-05-28 MED ORDER — APIXABAN 2.5 MG PO TABS
2.5000 mg | ORAL_TABLET | Freq: Two times a day (BID) | ORAL | Status: DC
  Administered 2016-05-29: 2.5 mg via ORAL
  Filled 2016-05-28: qty 1

## 2016-05-28 MED ORDER — PROPOFOL 10 MG/ML IV BOLUS
INTRAVENOUS | Status: DC | PRN
  Administered 2016-05-28: 150 mg via INTRAVENOUS

## 2016-05-28 MED ORDER — SUCCINYLCHOLINE CHLORIDE 200 MG/10ML IV SOSY
PREFILLED_SYRINGE | INTRAVENOUS | Status: AC
  Filled 2016-05-28: qty 10

## 2016-05-28 MED ORDER — SCOPOLAMINE 1 MG/3DAYS TD PT72
MEDICATED_PATCH | TRANSDERMAL | Status: AC
  Filled 2016-05-28: qty 1

## 2016-05-28 MED ORDER — MIDAZOLAM HCL 2 MG/2ML IJ SOLN
INTRAMUSCULAR | Status: AC
  Filled 2016-05-28: qty 2

## 2016-05-28 MED ORDER — DEXAMETHASONE SODIUM PHOSPHATE 10 MG/ML IJ SOLN
INTRAMUSCULAR | Status: AC
  Filled 2016-05-28: qty 1

## 2016-05-28 MED ORDER — DARIFENACIN HYDROBROMIDE ER 15 MG PO TB24
15.0000 mg | ORAL_TABLET | Freq: Every day | ORAL | Status: DC
  Administered 2016-05-29 – 2016-06-03 (×5): 15 mg via ORAL
  Filled 2016-05-28 (×6): qty 1

## 2016-05-28 MED ORDER — PROMETHAZINE HCL 25 MG/ML IJ SOLN: 6.2500 mg | INTRAMUSCULAR | Status: DC | PRN

## 2016-05-28 MED ORDER — SODIUM CHLORIDE 0.9 % IR SOLN
Status: DC | PRN
  Administered 2016-05-28: 500 mL

## 2016-05-28 MED ORDER — METOCLOPRAMIDE HCL 5 MG/ML IJ SOLN
5.0000 mg | Freq: Three times a day (TID) | INTRAMUSCULAR | Status: DC | PRN
Start: 2016-05-28 — End: 2016-06-03

## 2016-05-28 MED ORDER — ACETAMINOPHEN 650 MG RE SUPP: 650.0000 mg | Freq: Four times a day (QID) | RECTAL | Status: DC | PRN

## 2016-05-28 MED ORDER — HYDROMORPHONE HCL 1 MG/ML IJ SOLN
0.5000 mg | INTRAMUSCULAR | Status: DC | PRN
  Administered 2016-05-30 – 2016-06-02 (×2): 0.5 mg via INTRAVENOUS
  Filled 2016-05-28 (×2): qty 0.5

## 2016-05-28 MED ORDER — SUCCINYLCHOLINE CHLORIDE 200 MG/10ML IV SOSY
PREFILLED_SYRINGE | INTRAVENOUS | Status: DC | PRN
  Administered 2016-05-28: 100 mg via INTRAVENOUS

## 2016-05-28 MED ORDER — EPHEDRINE SULFATE 50 MG/ML IJ SOLN
INTRAMUSCULAR | Status: DC | PRN
  Administered 2016-05-28: 10 mg via INTRAVENOUS

## 2016-05-28 MED ORDER — ONDANSETRON HCL 4 MG PO TABS: 4.0000 mg | ORAL_TABLET | Freq: Four times a day (QID) | ORAL | Status: DC | PRN

## 2016-05-28 MED ORDER — METOCLOPRAMIDE HCL 5 MG PO TABS: 5.0000 mg | ORAL_TABLET | Freq: Three times a day (TID) | ORAL | Status: DC | PRN

## 2016-05-28 MED ORDER — ONDANSETRON HCL 4 MG/2ML IJ SOLN
INTRAMUSCULAR | Status: AC
  Filled 2016-05-28: qty 2

## 2016-05-28 MED ORDER — LIDOCAINE-EPINEPHRINE (PF) 1 %-1:200000 IJ SOLN
INTRAMUSCULAR | Status: AC
  Filled 2016-05-28: qty 30

## 2016-05-28 MED ORDER — ZOLPIDEM TARTRATE 5 MG PO TABS
5.0000 mg | ORAL_TABLET | Freq: Every evening | ORAL | Status: DC | PRN
  Administered 2016-05-28 – 2016-05-29 (×2): 5 mg via ORAL
  Filled 2016-05-28 (×2): qty 1

## 2016-05-28 MED ORDER — FENTANYL CITRATE (PF) 100 MCG/2ML IJ SOLN: 25.0000 ug | INTRAMUSCULAR | Status: DC | PRN

## 2016-05-28 SURGICAL SUPPLY — 58 items
BAG SPEC THK2 15X12 ZIP CLS (MISCELLANEOUS) ×1
BAG ZIPLOCK 12X15 (MISCELLANEOUS) ×3 IMPLANT
BANDAGE ACE 6X5 VEL STRL LF (GAUZE/BANDAGES/DRESSINGS) ×3 IMPLANT
BANDAGE ESMARK 6X9 LF (GAUZE/BANDAGES/DRESSINGS) ×1 IMPLANT
BNDG CMPR 9X6 STRL LF SNTH (GAUZE/BANDAGES/DRESSINGS) ×1
BNDG ESMARK 6X9 LF (GAUZE/BANDAGES/DRESSINGS) ×3
CHLORAPREP W/TINT 26ML (MISCELLANEOUS) IMPLANT
CLOTH 2% CHLOROHEXIDINE 3PK (PERSONAL CARE ITEMS) ×3 IMPLANT
CUFF TOURN SGL QUICK 34 (TOURNIQUET CUFF) ×3
CUFF TRNQT CYL 34X4X40X1 (TOURNIQUET CUFF) ×1 IMPLANT
DRAPE EXTREMITY T 121X128X90 (DRAPE) ×3 IMPLANT
DRAPE POUCH INSTRU U-SHP 10X18 (DRAPES) ×3 IMPLANT
DRAPE U-SHAPE 47X51 STRL (DRAPES) ×3 IMPLANT
DRSG ADAPTIC 3X8 NADH LF (GAUZE/BANDAGES/DRESSINGS) ×3 IMPLANT
DRSG PAD ABDOMINAL 8X10 ST (GAUZE/BANDAGES/DRESSINGS) ×3 IMPLANT
DURAPREP 26ML APPLICATOR (WOUND CARE) ×3 IMPLANT
ELECT REM PT RETURN 9FT ADLT (ELECTROSURGICAL) ×3
ELECTRODE REM PT RTRN 9FT ADLT (ELECTROSURGICAL) ×1 IMPLANT
EVACUATOR 1/8 PVC DRAIN (DRAIN) IMPLANT
FACESHIELD WRAPAROUND (MASK) ×15 IMPLANT
FACESHIELD WRAPAROUND OR TEAM (MASK) ×5 IMPLANT
GAUZE SPONGE 4X4 12PLY STRL (GAUZE/BANDAGES/DRESSINGS) ×3 IMPLANT
GLOVE BIOGEL PI IND STRL 7.0 (GLOVE) ×1 IMPLANT
GLOVE BIOGEL PI IND STRL 8 (GLOVE) ×1 IMPLANT
GLOVE BIOGEL PI INDICATOR 7.0 (GLOVE) ×2
GLOVE BIOGEL PI INDICATOR 8 (GLOVE) ×2
GLOVE SURG SS PI 7.0 STRL IVOR (GLOVE) ×3 IMPLANT
GLOVE SURG SS PI 7.5 STRL IVOR (GLOVE) ×3 IMPLANT
GLOVE SURG SS PI 8.0 STRL IVOR (GLOVE) ×6 IMPLANT
GOWN STRL REUS W/TWL LRG LVL3 (GOWN DISPOSABLE) ×3 IMPLANT
GOWN STRL REUS W/TWL XL LVL3 (GOWN DISPOSABLE) ×6 IMPLANT
HANDPIECE INTERPULSE COAX TIP (DISPOSABLE) ×3
IMMOBILIZER KNEE 20 (SOFTGOODS) ×3
IMMOBILIZER KNEE 20 THIGH 36 (SOFTGOODS) ×1 IMPLANT
IMMOBILIZER KNEE 22 UNIV (SOFTGOODS) ×2 IMPLANT
KIT BASIN OR (CUSTOM PROCEDURE TRAY) ×3 IMPLANT
MANIFOLD NEPTUNE II (INSTRUMENTS) ×3 IMPLANT
NS IRRIG 1000ML POUR BTL (IV SOLUTION) ×3 IMPLANT
PACK TOTAL JOINT (CUSTOM PROCEDURE TRAY) IMPLANT
PACK TOTAL KNEE CUSTOM (KITS) ×2 IMPLANT
PADDING CAST COTTON 6X4 STRL (CAST SUPPLIES) ×6 IMPLANT
POSITIONER SURGICAL ARM (MISCELLANEOUS) ×3 IMPLANT
SET HNDPC FAN SPRY TIP SCT (DISPOSABLE) ×1 IMPLANT
SPONGE LAP 18X18 X RAY DECT (DISPOSABLE) ×1 IMPLANT
STAPLER VISISTAT 35W (STAPLE) ×3 IMPLANT
SUCTION FRAZIER HANDLE 12FR (TUBING) ×2
SUCTION TUBE FRAZIER 12FR DISP (TUBING) ×1 IMPLANT
SUT VIC AB 0 CT1 27 (SUTURE) ×3
SUT VIC AB 0 CT1 27XBRD ANTBC (SUTURE) ×2 IMPLANT
SUT VIC AB 1 CT1 27 (SUTURE) ×3
SUT VIC AB 1 CT1 27XBRD ANTBC (SUTURE) ×4 IMPLANT
SUT VIC AB 2-0 CT1 27 (SUTURE) ×3
SUT VIC AB 2-0 CT1 TAPERPNT 27 (SUTURE) ×3 IMPLANT
SWAB COLLECTION DEVICE MRSA (MISCELLANEOUS) ×3 IMPLANT
SWAB CULTURE ESWAB REG 1ML (MISCELLANEOUS) ×3 IMPLANT
TRAY FOLEY W/METER SILVER 16FR (SET/KITS/TRAYS/PACK) ×3 IMPLANT
WATER STERILE IRR 1500ML POUR (IV SOLUTION) ×3 IMPLANT
WRAP KNEE MAXI GEL POST OP (GAUZE/BANDAGES/DRESSINGS) ×4 IMPLANT

## 2016-05-28 NOTE — Brief Op Note (Signed)
05/28/2016  3:00 PM  PATIENT:  [name]  61 y.o. female  PRE-OPERATIVE DIAGNOSIS:  RIGHT KNEE WOUND DEHISCENCE STATUS POST TOTAL KNEE  POST-OPERATIVE DIAGNOSIS:  right knee wound dehiscence status post  PROCEDURE:  Procedure(s): IRRIGATION AND DEBRIDEMENT KNEE WOUND VAC PLACMENT (Right)  SURGEON:  Surgeon(s) and Role:    * Susa Day, MD - Primary  PHYSICIAN ASSISTANT:   ASSISTANTS: Bissell   ANESTHESIA:   general  EBL:  Total I/O In: 1000 [I.V.:1000] Out: 350 [Urine:250; Blood:100]  BLOOD ADMINISTERED:none  DRAINS: none   LOCAL MEDICATIONS USED:  LIDOCAINE   SPECIMEN:  No Specimen  DISPOSITION OF SPECIMEN:  N/A  COUNTS:  YES  TOURNIQUET:    DICTATION: .Other Dictation: Dictation Number [id number]  PLAN OF CARE: Admit for overnight observation  PATIENT DISPOSITION:  PACU - hemodynamically stable.   Delay start of Pharmacological VTE agent (>24hrs) due to surgical blood loss or risk of bleeding: no

## 2016-05-28 NOTE — Interval H&P Note (Signed)
History and Physical Interval Note:  05/28/2016 12:44 PM  [name]  has presented today for surgery, with the diagnosis of [place]  The various methods of treatment have been discussed with the patient and family. After consideration of risks, benefits and other options for treatment, the patient has consented to  Procedure(s): IRRIGATION AND DEBRIDEMENT KNEE WOUND VAC PLACMENT  WITH POSSIBLE  POLY EXCHANGE (Right) as a surgical intervention .  The patient's history has been reviewed, patient examined, no change in status, stable for surgery.  I have reviewed the patient's chart and labs.  Questions were answered to the patient's satisfaction.     [name]

## 2016-05-28 NOTE — Interval H&P Note (Signed)
History and Physical Interval Note:  05/28/2016 12:45 PM  [name]  has presented today for surgery, with the diagnosis of [place]  The various methods of treatment have been discussed with the patient and family. After consideration of risks, benefits and other options for treatment, the patient has consented to  Procedure(s): IRRIGATION AND DEBRIDEMENT KNEE WOUND VAC PLACMENT  WITH POSSIBLE  POLY EXCHANGE (Right) as a surgical intervention .  The patient's history has been reviewed, patient examined, no change in status, stable for surgery.  I have reviewed the patient's chart and labs.  Questions were answered to the patient's satisfaction.     Lakendrick Paradis C

## 2016-05-28 NOTE — Transfer of Care (Signed)
Immediate Anesthesia Transfer of Care Note  Patient: [name]  Procedure(s) Performed: Procedure(s): IRRIGATION AND DEBRIDEMENT KNEE WOUND VAC PLACMENT (Right)  Patient Location: PACU  Anesthesia Type:General  Level of Consciousness:  sedated, patient cooperative and responds to stimulation  Airway & Oxygen Therapy:Patient Spontanous Breathing and Patient connected to face mask oxgen  Post-op Assessment:  Report given to PACU RN and Post -op Vital signs reviewed and stable  Post vital signs:  Reviewed and stable  Last Vitals:  Vitals:   05/28/16 1039  BP: 112/65  Pulse: (!) 116  Resp: 16  Temp: 37 C    Complications: No apparent anesthesia complications

## 2016-05-28 NOTE — Anesthesia Preprocedure Evaluation (Signed)
Anesthesia Evaluation  Patient identified by MRN, date of birth, ID band Patient awake    Reviewed: Allergy & Precautions, NPO status , Patient's Chart, lab work & pertinent test results  History of Anesthesia Complications (+) PONV and history of anesthetic complications  Airway Mallampati: II  TM Distance: >3 FB Neck ROM: Full    Dental  (+) Teeth Intact, Dental Advisory Given, Caps   Pulmonary neg pulmonary ROS,    Pulmonary exam normal breath sounds clear to auscultation       Cardiovascular Normal cardiovascular exam+ Valvular Problems/Murmurs MR  Rhythm:Regular Rate:Normal  Echo 10/17: Study Conclusions  - Left ventricle: The cavity size was normal. Wall thickness was normal. Systolic function was normal. The estimated ejection fraction was in the range of 60% to 65%. Doppler parameters are consistent with abnormal left ventricular relaxation (grade 1 diastolic dysfunction). - Mitral valve: There was mild regurgitation. - Pulmonary arteries: PA peak pressure: 49 mm Hg (S).  Impressions:  - GLS -15.2 (underestimated due to poor endocardial tracking). Lateral s&' 11.8 cm/s.   Neuro/Psych PSYCHIATRIC DISORDERS Anxiety  Neuromuscular disease    GI/Hepatic negative GI ROS, Neg liver ROS,   Endo/Other  negative endocrine ROS  Renal/GU negative Renal ROS     Musculoskeletal  (+) Arthritis , Osteoarthritis,    Abdominal   Peds  Hematology  (+) Blood dyscrasia (Eliquis), anemia ,   Anesthesia Other Findings Day of surgery medications reviewed with the patient.  Breast cancer s/p bilateral mastectomy, chemotherapy  Reproductive/Obstetrics                             Anesthesia Physical Anesthesia Plan  ASA: III  Anesthesia Plan: General   Post-op Pain Management:    Induction: Intravenous  Airway Management Planned: LMA  Additional Equipment:   Intra-op Plan:    Post-operative Plan: Extubation in OR  Informed Consent: I have reviewed the patients History and Physical, chart, labs and discussed the procedure including the risks, benefits and alternatives for the proposed anesthesia with the patient or authorized representative who has indicated his/her understanding and acceptance.   Dental advisory given  Plan Discussed with: CRNA  Anesthesia Plan Comments: (Risks/benefits of general anesthesia discussed with patient including risk of damage to teeth, lips, gum, and tongue, nausea/vomiting, allergic reactions to medications, and the possibility of heart attack, stroke and death.  All patient questions answered.  Patient wishes to proceed.)        Anesthesia Quick Evaluation

## 2016-05-28 NOTE — Anesthesia Procedure Notes (Addendum)
Procedure Name: Intubation Date/Time: 05/28/2016 1:26 PM Performed by: Maxwell Caul Pre-anesthesia Checklist: Patient identified, Emergency Drugs available, Suction available and Patient being monitored Patient Re-evaluated:Patient Re-evaluated prior to inductionOxygen Delivery Method: Circle system utilized Preoxygenation: Pre-oxygenation with 100% oxygen Intubation Type: IV induction Ventilation: Mask ventilation without difficulty Laryngoscope Size: Mac and 4 Grade View: Grade III Tube type: Oral Tube size: 7.0 mm Number of attempts: 1 Airway Equipment and Method: Stylet Placement Confirmation: ETT inserted through vocal cords under direct vision,  positive ETCO2 and breath sounds checked- equal and bilateral Secured at: 21 cm Tube secured with: Tape Dental Injury: Teeth and Oropharynx as per pre-operative assessment  Difficulty Due To: Difficulty was anticipated and Difficult Airway- due to anterior larynx

## 2016-05-28 NOTE — Interval H&P Note (Signed)
History and Physical Interval Note:  05/28/2016 12:44 PM  [name]  has presented today for surgery, with the diagnosis of [place]  The various methods of treatment have been discussed with the patient and family. After consideration of risks, benefits and other options for treatment, the patient has consented to  Procedure(s): IRRIGATION AND DEBRIDEMENT KNEE WOUND VAC PLACMENT  WITH POSSIBLE  POLY EXCHANGE (Right) as a surgical intervention .  The patient's history has been reviewed, patient examined, no change in status, stable for surgery.  I have reviewed the patient's chart and labs.  Questions were answered to the patient's satisfaction.     [name]

## 2016-05-28 NOTE — Anesthesia Postprocedure Evaluation (Signed)
Anesthesia Post Note  Patient: [name]  Procedure(s) Performed: Procedure(s) (LRB): IRRIGATION AND DEBRIDEMENT KNEE WOUND VAC PLACMENT (Right)  Patient location during evaluation: PACU Anesthesia Type: General Level of consciousness: awake and alert Pain management: pain level controlled Vital Signs Assessment: post-procedure vital signs reviewed and stable Respiratory status: spontaneous breathing, nonlabored ventilation, respiratory function stable and patient connected to nasal cannula oxygen Cardiovascular status: blood pressure returned to baseline and stable Postop Assessment: no signs of nausea or vomiting Anesthetic complications: no       Last Vitals:  Vitals:   05/28/16 1600 05/28/16 1630  BP: (!) 143/74 135/74  Pulse: (!) 107 (!) 113  Resp: 16 19  Temp: 37.4 C     Last Pain:  Vitals:   05/28/16 1600  TempSrc:   PainSc: 0-No pain                 Catalina Gravel

## 2016-05-28 NOTE — H&P (View-Only) (Signed)
[name] is an 61 y.o. female.   Chief Complaint: R knee wound drainage HPI: [name] follows up today for her right knee. She is now nine weeks out from total knee replacement, five weeks and five days out from I and D and repair of wound dehiscence. Since the last time that she was here on December 5th, at which point we had taken out her sutures, she had a readmission to the hospital over at [facility] for possible UTIs as well as may be some cellulitis at that incision. She states when we took the sutures out, the incision was fully healed a week or may be a week and a half after that. She noticed it started to open up. There was a spot at both the proximal and distal aspect of the incision. The part of the proximal aspect fully healed itself. At the distal aspect, she has two small openings that are connected that were evaluated in the hospital and she was readmitted on the 16th. She was seen by Dr. Veverly Fells and follow up in the hospital. She was also seen by Dr. Alvan Dame. They thought that may be some local cellulitis, but no findings consistent with a deep infection. She was then admitted on the Internal Medicine Service and her symptoms improved with Cipro. Therefore, they discharged her on that. She was off that for may be one or two days, followed up with her PCP yesterday, December 27th, and she was placed back on the Cipro for another week. Although her cultures were negative for a UTI, she did have moderate leukocytes, but no growth. Her PCP really put her back on the Cipro for the knee as that was still open and draining. She on and off had some increase in her temperature, never over 100. She does take Tylenol as needed for that. Otherwise, she has been feeling fine. When she first went back to the hospital on the 16th, she thought maybe she had some type of viral syndrome going on. She was not feeling well, maybe some flu like symptoms, did vomit prior to going to the hospital. She has not been to  that point since being out. While she was off the Cipro for the few days, she did not notice any change in her symptoms. No increase in the redness or the size or the drainage. She has been doing dressing changes twice a day, placing bacitracin as directed by the wound care from the hospital in the opening and keeping it covered with gauze. Reports a serosanguineous type of drainage. Most of her pain is actually not at the opening of the incision, it is medial which she was having when she had the dehiscence. She has been taking tramadol as needed, asking for refill of that, has been icing and elevating which does seem to help. She has chemo again tomorrow. She is on a three-week cycle for that. Reports otherwise she is feeling okay.  Past Medical History:  Diagnosis Date  . 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 Arthritis KNEES   right knee, hx. past left knee replacemnt.  . Blood clot in vein    around Portacath right chest-tx. Eliquis about a yr., prior Lovenox.  . Chronic anticoagulation HX  PORT-A-CATH CLOT   2010 - HAS BEEN ON LOVENOX SINCE THE CLOT  . Elevated blood pressure reading without diagnosis of hypertension    PT MONITORS HER B/P AT HOME  . Heart murmur   . 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 PONV (postoperative nausea and vomiting)   . Skin cancer of anterior chest BREAST CANCER PRIMARY W/ METS TO CHEST WALL SKIN CANCER--  CHEMOTHERAPY EVERY 3 WEEKS AT [facility]  . Status post skin flap graft    right chest wall with metastatis right anterior chest -no drainage or open wound.    Past Surgical History:  Procedure Laterality Date  . CHEST WALL TUMOR EXCISION    . hemotoma     evacuation left chest wall  . IRRIGATION AND DEBRIDEMENT KNEE Right 04/12/2016   Procedure: IRRIGATION AND DEBRIDEMENT KNEE;  Surgeon: Nicholes Stairs, MD;   Location: [facility];  Service: Orthopedics;  Laterality: Right;  . KNEE ARTHROSCOPY  02/13/2012   Procedure: ARTHROSCOPY KNEE;  Surgeon: Johnn Hai, MD;  [facility];  Service: Orthopedics;  Laterality: Left;  WITH DEBRIDEMENt   . KNEE ARTHROSCOPY WITH LATERAL MENISECTOMY  02/13/2012   Procedure: KNEE ARTHROSCOPY WITH LATERAL MENISECTOMY;  Surgeon: Johnn Hai, MD;  Location: [place];  Service: Orthopedics;;  partial  . LEFT MODIFIED RADICAL MASTECTOMY/ RIGHT TOTAL MASTECTOMY  11-13-2007   LEFT BREAST CANCER W/ AXILLARY LYMPH NODE METASTASIS AND POST NEOADJUVANT CHEMO  . PLACEMENT PORT-A-CATH  06/22/2007   right chest  . SKIN GRAFT     chest-post tumor removal, second occurrence of cancer" 2015 surgery for Flap 2015"  . TOTAL KNEE ARTHROPLASTY Left 09/23/2012   Procedure: LEFT TOTAL KNEE ARTHROPLASTY;  Surgeon: Johnn Hai, MD;  Location: [facility];  Service: Orthopedics;  Laterality: Left;  . TOTAL KNEE ARTHROPLASTY Right 03/20/2016   Procedure: RIGHT TOTAL KNEE ARTHROPLASTY;  Surgeon: Susa Day, MD;  Location: [facility];  Service: Orthopedics;  Laterality: Right;  . TRANSTHORACIC ECHOCARDIOGRAM  11-18-2011  DR BENSIMHON (ECHO EVERY 3 MONTHS)   HX CHEMO INDUCED CARDIOTOXICITY/  LVSF NORMAL/ EF 55-50%/ MILD MITRIAL VALVE REGURG./ MILDLY INCREASED SYSTOLIC PRESSURE OF PULMONARY ARTERIES    Family History  Problem Relation Age of Onset  . Heart disease Mother     due to mitral valve regurgiation,   . Cancer Mother     breast  . Parkinsonism Father   . Alcohol abuse Paternal Grandfather    Social History:  reports that she has never smoked. She has never used smokeless tobacco. She reports that she does not drink alcohol or use drugs.  Allergies:  Allergies  Allergen Reactions  . Tape Hives    Can tolerate paper tape  . Other Rash    STERI STRIPS - Blisters  . Penicillins Rash    Denies airway involvement Has patient had a PCN  reaction causing immediate rash, facial/tongue/throat swelling, SOB or lightheadedness with hypotension: No Has patient had a PCN reaction causing severe rash involving mucus membranes or skin necrosis: No Has patient had a PCN reaction that required hospitalization No Has patient had a PCN reaction occurring within the last 10 years: No If all of the above answers are "NO", then may proceed with Cephalosporin use.       (Not in a hospital admission)  No results found for this or any previous visit (from the past 48 hour(s)). No results found.  Review of Systems  Constitutional: Positive for chills, fever and weight loss.  HENT: Negative.   Eyes: Negative.   Respiratory: Negative.  Cardiovascular: Negative.   Gastrointestinal: Negative.   Genitourinary: Negative.   Musculoskeletal: Positive for joint pain.  Skin: Positive for rash.  Neurological: Negative.     Last menstrual period 06/22/2007. Physical Exam  Constitutional: She is oriented to person, place, and time. She appears well-developed and well-nourished.  HENT:  Head: Normocephalic.  Eyes: Pupils are equal, round, and reactive to light.  Neck: Normal range of motion.  Cardiovascular: Normal rate.   Respiratory: Effort normal.  GI: Soft.  Musculoskeletal:  On exam today of right knee, proximal portion of the incision is well healed. At the distal third of the incision, there are two openings noted that are connected. There is a small bridge of skin between the two. The larger of the two is approximately dime sized. The smaller is about 0.5 cm in diameter. There is serosanguinous drainage there today which I am able to express onto a piece of gauze. There appears to be a small amount of granulation tissue as well as some necrotic tissue in the base of that. Dr. Tonita Cong did debride that with hydrogen peroxide today. No active drainage following that. We placed plain packing in into the opening and redressed that with a dry  piece of gauze and then an Ace bandage. No increased lower extremity edema. She has some very slight redness at the medial aspect of the knee. This is actually the same as when I last saw her about three weeks ago. No increase in that being off the antibiotics. No calf pain or sign of DVT.  Neurological: She is alert and oriented to person, place, and time.    X-rays from December 5th reviewed with total knee prosthesis in excellent alignment with no signs of osteolysis or loosening.  Assessment/Plan Right knee nine weeks status post total knee replacement, five weeks five days out from I and D and repair of wound dehiscence, nearly two weeks out from re-admission for possible UTI with negative cultures as well as some cellulitis at the right knee with no evidence of deep infection. Also to be noted, her blood cultures x2 were negative from that re-admission and the highest white count was 14. Urine cultures were negative for growth, but had moderate amount of leukocytes.  As noted above, we packed her wound today. She will continue to do dressing changes twice a day with the gauze. Each time she does that she can slightly advance the packing, pulling that out a slight bit each time, avoid the bacitracin at this point. We will have her continue on the antibiotics at this point until this wound is closed. Dr. Tonita Cong did discuss this with Dr. Alvan Dame today and he was in agreement with that plan. He had seen her last time she was in the hospital. We will keep her on the antibiotics until this wound closes and then we can trial her off of them. We will see her in four days Tuesday for a wound check at which point we can place new packing if necessary, plan to do a repeat debridement in the office here of that wound. I would still continue to limit her flexion. She can continue to work on extension as well as some quad strengthening exercises, extension based, ice and elevate for swelling. She is to let us know if  she has any change in her symptoms, any worsening, continue to keep an eye on this and change that dressing twice a day. We will see her back at that point on Tuesday. The  patient was seen in conjunction with Dr. Tonita Cong today. Given ongoing symptoms, pt hx with immunocompromised status, ongoing open wound non-healing despite time on abx and debridement, Dr. Tonita Cong discussed with the patient the day after her visit, proceeding with R I&D, wound vac placement, possible poly exchange. Discussed the procedure itself as well as risks, complications, and alternative options including but not limited to DVT, PE, failure of procedure, need for secondary procedure, anesthesia risk, even death. Discussed delayed healing with her medical hx and prolonged need for a wound vac given that to ensure proper healing. All her questions were answered and she desires to proceed.  Plan Right knee I&D, wound vac placement, possible poly exchange  Cecilie Kicks., PA-C for Dr. Tonita Cong 05/27/2016, 10:18 AM

## 2016-05-29 DIAGNOSIS — C773 Secondary and unspecified malignant neoplasm of axilla and upper limb lymph nodes: Secondary | ICD-10-CM | POA: Diagnosis not present

## 2016-05-29 DIAGNOSIS — R21 Rash and other nonspecific skin eruption: Secondary | ICD-10-CM | POA: Diagnosis not present

## 2016-05-29 DIAGNOSIS — M009 Pyogenic arthritis, unspecified: Secondary | ICD-10-CM | POA: Diagnosis not present

## 2016-05-29 DIAGNOSIS — T8131XA Disruption of external operation (surgical) wound, not elsewhere classified, initial encounter: Secondary | ICD-10-CM | POA: Diagnosis not present

## 2016-05-29 DIAGNOSIS — Z7901 Long term (current) use of anticoagulants: Secondary | ICD-10-CM | POA: Diagnosis not present

## 2016-05-29 DIAGNOSIS — T8453XA Infection and inflammatory reaction due to internal right knee prosthesis, initial encounter: Secondary | ICD-10-CM | POA: Diagnosis not present

## 2016-05-29 DIAGNOSIS — T8189XA Other complications of procedures, not elsewhere classified, initial encounter: Secondary | ICD-10-CM | POA: Diagnosis not present

## 2016-05-29 DIAGNOSIS — C7989 Secondary malignant neoplasm of other specified sites: Secondary | ICD-10-CM | POA: Diagnosis not present

## 2016-05-29 DIAGNOSIS — N390 Urinary tract infection, site not specified: Secondary | ICD-10-CM | POA: Diagnosis not present

## 2016-05-29 DIAGNOSIS — B952 Enterococcus as the cause of diseases classified elsewhere: Secondary | ICD-10-CM | POA: Diagnosis not present

## 2016-05-29 LAB — COMPREHENSIVE METABOLIC PANEL
ALT: 28 U/L (ref 14–54)
ANION GAP: 9 (ref 5–15)
AST: 59 U/L — ABNORMAL HIGH (ref 15–41)
Albumin: 2.8 g/dL — ABNORMAL LOW (ref 3.5–5.0)
Alkaline Phosphatase: 111 U/L (ref 38–126)
BILIRUBIN TOTAL: 0.6 mg/dL (ref 0.3–1.2)
BUN: 16 mg/dL (ref 6–20)
CALCIUM: 8.7 mg/dL — AB (ref 8.9–10.3)
CO2: 27 mmol/L (ref 22–32)
Chloride: 99 mmol/L — ABNORMAL LOW (ref 101–111)
Creatinine, Ser: 0.5 mg/dL (ref 0.44–1.00)
GFR calc non Af Amer: >60 mL/min (ref >60)
Glucose, Bld: 113 mg/dL — ABNORMAL HIGH (ref 65–99)
Potassium: 3.6 mmol/L (ref 3.5–5.1)
Sodium: 135 mmol/L (ref 135–145)
TOTAL PROTEIN: 6.8 g/dL (ref 6.5–8.1)

## 2016-05-29 LAB — CBC WITH DIFFERENTIAL/PLATELET
BASOS ABS: 0 10*3/uL (ref 0.0–0.1)
BASOS PCT: 0 %
EOS ABS: 0 10*3/uL (ref 0.0–0.7)
Eosinophils Relative: 0 %
HEMATOCRIT: 32 % — AB (ref 36.0–46.0)
HEMOGLOBIN: 10.4 g/dL — AB (ref 12.0–15.0)
Lymphocytes Relative: 21 %
Lymphs Abs: 0.6 10*3/uL — ABNORMAL LOW (ref 0.7–4.0)
MCH: 29.8 pg (ref 26.0–34.0)
MCHC: 32.5 g/dL (ref 30.0–36.0)
MCV: 91.7 fL (ref 78.0–100.0)
MONO ABS: 0.2 10*3/uL (ref 0.1–1.0)
Monocytes Relative: 7 %
NEUTROS ABS: 1.9 10*3/uL (ref 1.7–7.7)
NEUTROS PCT: 72 %
Platelets: 198 10*3/uL (ref 150–400)
RBC: 3.49 MIL/uL — ABNORMAL LOW (ref 3.87–5.11)
RDW: 16.2 % — AB (ref 11.5–15.5)
WBC: 2.6 10*3/uL — ABNORMAL LOW (ref 4.0–10.5)

## 2016-05-29 MED ORDER — SODIUM CHLORIDE 0.9% FLUSH
10.0000 mL | INTRAVENOUS | Status: DC | PRN
  Administered 2016-05-31 – 2016-06-03 (×3): 10 mL
  Filled 2016-05-29 (×3): qty 40

## 2016-05-29 MED ORDER — APIXABAN 5 MG PO TABS: 5.0000 mg | ORAL_TABLET | Freq: Two times a day (BID) | ORAL | Status: DC

## 2016-05-29 MED ORDER — BISACODYL 5 MG PO TBEC: 5.0000 mg | DELAYED_RELEASE_TABLET | Freq: Every day | ORAL | Status: DC | PRN

## 2016-05-29 MED ORDER — ENSURE ENLIVE PO LIQD
237.0000 mL | Freq: Two times a day (BID) | ORAL | Status: DC
  Administered 2016-05-29 – 2016-06-03 (×7): 237 mL via ORAL
  Filled 2016-05-29 (×2): qty 237

## 2016-05-29 MED ORDER — CIPROFLOXACIN HCL 500 MG PO TABS
500.0000 mg | ORAL_TABLET | Freq: Two times a day (BID) | ORAL | Status: DC
  Administered 2016-05-29 – 2016-05-30 (×3): 500 mg via ORAL
  Filled 2016-05-29 (×4): qty 1

## 2016-05-29 MED ORDER — ENOXAPARIN SODIUM 30 MG/0.3ML ~~LOC~~ SOLN
30.0000 mg | Freq: Two times a day (BID) | SUBCUTANEOUS | Status: AC
  Administered 2016-05-29: 30 mg via SUBCUTANEOUS
  Filled 2016-05-29: qty 0.3

## 2016-05-29 MED ORDER — SODIUM CHLORIDE 0.9% FLUSH: 10.0000 mL | Freq: Two times a day (BID) | INTRAVENOUS | Status: DC

## 2016-05-29 NOTE — Care Management Note (Signed)
Case Management Note  Patient Details  Name: [name] MRN: [id number] Date of Birth: 1956-01-17  Subjective/Objective:                  IRRIGATION AND DEBRIDEMENT KNEE WOUND VAC PLACMENT (Right) Action/Plan: Discharge planning Expected Discharge Date:  05/29/16               Expected Discharge Plan:  [place]  In-House Referral:     Discharge planning Services  CM Consult  Post Acute Care Choice:  Home Health Choice offered to:  Patient  DME Arranged:  Vac DME Agency:  [facility]  HH Arranged:  RN [place] Agency:  [facility]  Status of Service:  Completed, signed off  If discussed at [place] of Stay Meetings, dates discussed:    Additional Comments: CM met with pt in room to offer choice of home health agency. Pt chooses [facility] to render [facility] for wound VAC changes.  Referral given to [facility] rep, Joelene Millin.  CM notified [facility] rep, Liliane Bade who was kind enough to walk [place] through the lengthy forms to secure a wound vac for pt to go home with today.  Dellie Catholic, RN 05/29/2016, 11:22 AM

## 2016-05-29 NOTE — Op Note (Signed)
NAME:  [name], [name] NO.:  MEDICAL RECORD NO.:  [id number]  LOCATION:                                 FACILITY:  PHYSICIAN:  Susa Day, M.D.    DATE OF BIRTH:  Apr 07, 1956  DATE OF PROCEDURE:  05/28/2016 DATE OF DISCHARGE:                              OPERATIVE REPORT   PREOPERATIVE DIAGNOSIS:  Right total knee wound dehiscence.  POSTOPERATIVE DIAGNOSIS:  Right total knee wound dehiscence.  PROCEDURE PERFORMED: 1. Irrigation and debridement of the total knee incision wound. 2. Application of wound VAC to the lower knee incision. 3. Debridement and primary closure of proximal wound incision. 4. Aspiration of the knee joint.  ANESTHESIA:  General.  ASSISTANT:  Cleophas Dunker, PA.  HISTORY:  This is a 61 year old female with history of metastatic breast cancer, chronic.  She was status post knee replacement on the right. Unfortunately postoperatively 4 weeks she had fallen down the stairs, dehisced the wound.  She was irrigated, debrided, primarily closed by Dr. Stann Mainland.  She had done well with that until 3 weeks following that surgical procedure.  The patient was admitted to the hospital with sepsis, felt she was uroseptic, was then placed on ciprofloxacin and negative cultures on UTI.  It was thought she may have had cellulitis to the leg.  She developed this open nonhealing portion of the wound distally in the infrapatellar region, it expanded over a week and half period of time by 1.5 cm.  She was debrided in the office and then felt to have a superficial nonhealing portion of the wound.  It was felt that aggressive wound management would be favorable given the context of this wound.  She continued on ciprofloxacin, we indicated for irrigation, debridement, placement of wound VAC and possible open I and D and poly exchange.  Risk and benefits were discussed including bleeding, infection, no change and worsening symptoms, need for poly  exchange and I and D, etc.  TECHNIQUE:  With the patient in supine position, after induction of adequate general anesthesia, 2 g Kefzol, the right lower extremity was prepped and draped in usual sterile fashion.  Distally the area of the wound from halfway beneath the patella to the tibial tubercle was opened and debrided the skin edges to good bleeding tissue and debrided the superficial tissue just on to and top of the tendon.  There was good granulation tissue.  There were some areas there however, which shows some devitalized tissue.  This was then curetted to good bleeding tissue.  It was then irrigated copiously with antibiotic irrigation and I probed with a sterile Q-Tip and digitally probed it.  She had no evidence of a deep extension of the wound or into the arthrotomy, then covered the wound and then proximally other 2 punctate areas and proximal aspect of the incision that were slightly draining serous fluid.  I incised it approximately 3 cm in length, debrided the skin edges, appeared to be from the old scar, again down to the top of the fascia.  There was no breach in the arthrotomy noted.  I irrigated this wound.  No devitalized  tissue.  I closed in layers with 2-0 and 4-0 vertical mattress sutures and placed Adaptic and 4x4s over that.  The DuraPrep on the lateral aspect of the knee, I aspirated 40 mL of sero- bloody fluid from the knee, it was not grossly infected.  This was sent for stat Gram stain, cultures, and also a cell count.  Following this negative Gram stain we fashioned a sponge from the wound VAC into the open wound, as it was not primarily closable.  This measured approximately 5 cm x 3 cm and then covered it with a wound VAC, connected it to continuous suction with good engagement of the localized vacuum.  The wound dressing proximally closed with Ace bandage and distally.  The patient was then awoken without difficulty after placed in a knee immobilizer,  and sent to the recovery room in satisfactory condition.  The patient tolerated the procedure well.  No complications.  Minimal blood loss.     Susa Day, M.D.     Geralynn Rile  D:  05/28/2016  T:  05/29/2016  Job:  [id number]

## 2016-05-29 NOTE — Progress Notes (Addendum)
ANTICOAGULATION CONSULT NOTE - Initial Consult  Pharmacy Consult for enoxaparin Indication: VTE prophylaxis/h/o thrombus at site of port-a-cath  Allergies  Allergen Reactions  . Tape Hives    Can tolerate paper tape  . Other Rash    STERI STRIPS - Blisters  . Penicillins Rash    Denies airway involvement Has patient had a PCN reaction causing immediate rash, facial/tongue/throat swelling, SOB or lightheadedness with hypotension: No Has patient had a PCN reaction causing severe rash involving mucus membranes or skin necrosis: No Has patient had a PCN reaction that required hospitalization No Has patient had a PCN reaction occurring within the last 10 years: No If all of the above answers are "NO", then may proceed with Cephalosporin use.      Patient Measurements: Height: _0  (170.2 cm) Weight: 155 lb (70.3 kg) IBW/kg (Calculated) : 61.6 Heparin Dosing Weight:   Vital Signs: Temp: 98.1 F (36.7 C) (01/04 1415) Temp Source: Oral (01/04 1415) BP: 126/73 (01/04 1415) Pulse Rate: 103 (01/04 1415)  Labs:  Recent Labs  05/28/16 1105 05/29/16 0443  HGB  --  10.4*  HCT  --  32.0*  PLT  --  198  APTT 38*  --   LABPROT 13.8  --   INR 1.06  --   CREATININE  --  0.50    Estimated Creatinine Clearance: 72.7 mL/min (by C-G formula based on SCr of 0.5 mg/dL).   Medical History: Past Medical History:  Diagnosis Date  . 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 Arthritis KNEES   right knee, hx. past left knee replacemnt.  . Blood clot in vein    around Portacath right chest-tx. Eliquis about a yr., prior Lovenox.  . Blood dyscrasia   . Chronic anticoagulation HX  PORT-A-CATH CLOT   2010 - HAS BEEN ON LOVENOX SINCE THE CLOT  . Elevated blood pressure reading without diagnosis of hypertension    PT MONITORS HER B/P AT HOME  . Heart murmur   . 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 PONV (postoperative nausea and vomiting)   . Skin cancer of anterior chest BREAST CANCER PRIMARY W/ METS TO CHEST WALL SKIN CANCER--  CHEMOTHERAPY EVERY 3 WEEKS AT [facility]  . Status post skin flap graft    right chest wall with metastatis right anterior chest -no drainage or open wound.    Assessment: 66 YOF s/p I&D of of R knee d/t infection following TKA on 03/20/2016.  Patient was taking apixaban prior to admission for h/o thrombus of innominate vein and SVC in 2010.  She was initially placed on the lower, VTE Prophylaxis, dose of apixaban 2.40m BID then orders to increase to treatment dose that she was on prior to admission.  The eliquis 576mPO BID was stopped prior to next dose and pharmacy now asked to dose enoxaparin for VTE prophylaxis.  Discussed with Dr. BeTonita Congeason for change to enoxaparin is the possible need for additional surgery this admission and wishes to use something shorter acting that will allow operation.  Decision for operation likely to be made within next 24h  Patient received eliquis 2.21m14moday at ~9am - CrCl > 61m68mn   Goal of Therapy:  Xa level 0.3-0.6   Plan:   Start enoxaparin 61mg26mq12h - 1st dose tonight  When appropriate, change to  full dose anticoagulation (such as eliquis 30m BID) for h/o thrombus of inominant vein and SVC  Monitor for bleeding, watch renal function  ZClovis Riley1/08/2016,2:31 PM

## 2016-05-29 NOTE — Progress Notes (Signed)
Nutrition Brief Note  RD consulted for nutritional assessment. RD attempted to see patient x 2, other providers in room at times of visit.   1/3: S/p IRRIGATION AND DEBRIDEMENT KNEE WOUND VAC PLACEMENT.  Pt has been eating 100% of meals. Pt with insignificant weight loss over the last 2 months. Pt has been drinking Ensure supplements provided.  Noted plans for discharge today.  Wt Readings from Last 15 Encounters:  05/28/16 155 lb (70.3 kg)  05/21/16 155 lb 2 oz (70.4 kg)  05/10/16 155 lb (70.3 kg)  03/20/16 163 lb (73.9 kg)  03/17/16 160 lb 3 oz (72.7 kg)  03/03/16 163 lb 8 oz (74.2 kg)  01/25/16 162 lb 8 oz (73.7 kg)  06/06/15 168 lb 4.8 oz (76.3 kg)  04/18/15 164 lb 12.8 oz (74.8 kg)  02/02/15 156 lb (70.8 kg)  01/19/15 164 lb (74.4 kg)  09/08/14 170 lb (77.1 kg)  07/14/14 170 lb 7.5 oz (77.3 kg)  06/05/14 175 lb (79.4 kg)  01/12/14 168 lb (76.2 kg)    Body mass index is 24.28 kg/m. Patient meets criteria for normal based on current BMI.   Current diet order is regular , patient is consuming approximately 100% of meals at this time. Labs and medications reviewed.   No further nutrition interventions warranted at this time. If nutrition issues arise, please consult RD.   Clayton Bibles, MS, RD, LDN Pager: [phone] After Hours Pager: [phone]

## 2016-05-29 NOTE — Progress Notes (Addendum)
Subjective: 1 Day Post-Op Procedure(s) (LRB): IRRIGATION AND DEBRIDEMENT KNEE WOUND VAC PLACMENT (Right) Patient reports pain as mild. Well controlled. Foley still in place. Has not been OOB yet with PT. Feeling well otherwise. Asking if she will go home today.   Objective: Vital signs in last 24 hours: Temp:  [97.4 F (36.3 C)-99.3 F (37.4 C)] 97.8 F (36.6 C) (01/04 0942) Pulse Rate:  [95-116] 98 (01/04 0942) Resp:  [14-20] 18 (01/04 0942) BP: (109-154)/(45-90) 114/53 (01/04 0942) SpO2:  [94 %-100 %] 96 % (01/04 0942) Weight:  [70.3 kg (155 lb)] 70.3 kg (155 lb) (01/03 1117)  Intake/Output from previous day: 01/03 0701 - 01/04 0700 In: 2302 [P.O.:702; I.V.:1600] Out: 2650 [Urine:2550; Blood:100] Intake/Output this shift: Total I/O In: 360 [P.O.:360] Out: 600 [Urine:600]   Recent Labs  05/29/16 0443  HGB 10.4*    Recent Labs  05/29/16 0443  WBC 2.6*  RBC 3.49*  HCT 32.0*  PLT 198    Recent Labs  05/29/16 0443  NA 135  K 3.6  CL 99*  CO2 27  BUN 16  CREATININE 0.50  GLUCOSE 113*  CALCIUM 8.7*    Recent Labs  05/28/16 1105  INR 1.06    Neurologically intact ABD soft Neurovascular intact Sensation intact distally Intact pulses distally Dorsiflexion/Plantar flexion intact Incision: dressing C/D/I and no drainage No cellulitis present Compartment soft no sign of DVT  Assessment/Plan: 1 Day Post-Op Procedure(s) (LRB): IRRIGATION AND DEBRIDEMENT KNEE WOUND VAC PLACMENT (Right) Advance diet Up with therapy D/C IV fluids  Awaiting cx results, cell count 13,000, no growth, pt was on cipro at home Continue PO cipro 500mg  BID at home dose  Ensure  Foley out Start PT, may ambulate without immobilizer and flex to 30 while ambulating, to 90 if tolerated while seated Seen by myself and Dr. Mliss Fritz, Jalene Demo M. 05/29/2016, 10:25 AM   Cultures with no growth at this point, but pt was on abx prior to cultures (cipro) Cell count 13,000 Will  keep NPO for possible I&D and poly exchange R knee  tomorrow afternoon Stopped Eliquis 2.5mg  after AM dose, One dose of Lovenox 30mg  subQ ordered for 2200 tonight Hold any anticoagulation following Lovenox tonight in anticipation of surgery Continue cipro for now Consider ID consult for abx coverage recommendations Dr. Tonita Cong to discuss with patient tonight

## 2016-05-29 NOTE — Evaluation (Signed)
Physical Therapy Evaluation Patient Details Name: [name] MRN: [id number] DOB: 08-20-1955 Today's Date: 05/29/2016   History of Present Illness  61 yo female s/p R TKA 2 months ago with PMH: L TKR (14) and Breast CA with bil Mastectomy. 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   Clinical Impression  Pt tolerated evaluation and session well. Educated and reviewed instructed by MD for ROM and exercises. No further acute PT needs at this time.     Follow Up Recommendations Outpatient PT (continue with OPPT when instructed by her MD )    Equipment Recommendations  None recommended by PT    Recommendations for Other Services       Precautions / Restrictions Precautions Precaution Comments: MD stated does not hneed to wear KI, can ambulate without KI, and flex knee with ambulation to 30 degrees and while in sitting to 90 degrees. Pt aware and repeated these to PT without difficulty Required Braces or Orthoses:  (wound VAC in place ) Restrictions Weight Bearing Restrictions: No Other Position/Activity Restrictions: WBAT       Mobility  Bed Mobility Overal bed mobility: Independent                Transfers Overall transfer level: Needs assistance Equipment used: Rolling walker (2 wheeled) Transfers: Sit to/from Stand Sit to Stand: Supervision         General transfer comment: for VAC and line assistance   Ambulation/Gait Ambulation/Gait assistance: Supervision Ambulation Distance (Feet): 200 Feet Assistive device: Rolling walker (2 wheeled) Gait Pattern/deviations: Step-to pattern     General Gait Details: steady gait, step to pattern, not having to use the RW a lot but helpful for holding wound VAC at this time.   Stairs Stairs: Yes Stairs assistance: Supervision Stair Management: One rail Right Number of Stairs: 3 General stair comments:  tolerated steps well, step to pattern. Supervision   Wheelchair Mobility    Modified Rankin (Stroke Patients Only)       Balance                                             Pertinent Vitals/Pain Pain Assessment: 0-10 Pain Score: 2  (very little , controlled by meds ) Pain Location: R knee Pain Descriptors / Indicators: Aching Pain Intervention(s): Monitored during session;Ice applied    Home Living Family/patient expects to be discharged to:: Private residence Living Arrangements: Spouse/significant other Available Help at Discharge: Family Type of Home: House Home Access: Stairs to enter Entrance Stairs-Rails: Right Entrance Stairs-Number of Steps: 4 Home Layout: Two level;Able to live on main level with bedroom/bathroom Home Equipment: Gilford Rile - 2 wheels;Cane - single point;Bedside commode Additional Comments: 1/2 bath and single bed on main level    Prior Function Level of Independence: Independent         Comments: Works full time as Therapist, sports in cath lab     [employer]   Dominant Hand: Right    Extremity/Trunk Assessment        Lower Extremity Assessment Lower Extremity Assessment: Overall WFL for tasks assessed (R kne to full extensionwith quad set and resting at 90 with no force in sitting. Able to perform SLR independently with RLE )  Communication   Communication: No difficulties  Cognition Arousal/Alertness: Awake/alert Behavior During Therapy: WFL for tasks assessed/performed Overall Cognitive Status: Within Functional Limits for tasks assessed                      General Comments      Exercises Total Joint Exercises Ankle Circles/Pumps: AROM;Both;10 reps;Supine Quad Sets: AROM;Right;10 reps;Supine Heel Slides: AAROM;5 reps;Supine (small 0-40 ) Straight Leg Raises: AROM;10 reps;Supine Knee Flexion: PROM (gentle ROM seated to 0-80/90 . not forced, just gentle on floor beside the other leg. )    Assessment/Plan    PT Assessment Patent does not need any further PT services  PT Problem List            PT Treatment Interventions      PT Goals (Current goals can be found in the Care Plan section)  Acute Rehab PT Goals Patient Stated Goal: I want to get this better  PT Goal Formulation: All assessment and education complete, DC therapy    Frequency     Barriers to discharge        Co-evaluation               End of Session Equipment Utilized During Treatment: Gait belt Activity Tolerance: Patient tolerated treatment well Patient left: in bed;with call bell/phone within reach;with family/visitor present Nurse Communication: Mobility status    Functional Assessment Tool Used: clincial judgement  Functional Limitation: Mobility: Walking and moving around Mobility: Walking and Moving Around Current Status [phone]): 0 percent impaired, limited or restricted Mobility: Walking and Moving Around Goal Status [phone]): 0 percent impaired, limited or restricted Mobility: Walking and Moving Around Discharge Status [phone]): 0 percent impaired, limited or restricted    Time: 1115-1145 PT Time Calculation (min) (ACUTE ONLY): 30 min   Charges:   PT Evaluation $PT Eval Low Complexity: 1 Procedure PT Treatments $Gait Training: 8-22 mins   PT G Codes:   PT G-Codes **NOT FOR INPATIENT CLASS** Functional Assessment Tool Used: clincial judgement  Functional Limitation: Mobility: Walking and moving around Mobility: Walking and Moving Around Current Status [id number]): 0 percent impaired, limited or restricted Mobility: Walking and Moving Around Goal Status [id number]): 0 percent impaired, limited or restricted Mobility: Walking and Moving Around Discharge Status [id number]): 0 percent impaired, limited or restricted    Kona Lover 05/29/2016, 12:32 PM  Clide Dales, PT Pager: [phone] 05/29/2016

## 2016-05-30 ENCOUNTER — Encounter (HOSPITAL_COMMUNITY): Payer: Self-pay

## 2016-05-30 ENCOUNTER — Encounter: Payer: Self-pay | Admitting: Physical Therapy

## 2016-05-30 ENCOUNTER — Ambulatory Visit (HOSPITAL_COMMUNITY): Payer: 59 | Admitting: Certified Registered Nurse Anesthetist

## 2016-05-30 ENCOUNTER — Encounter (HOSPITAL_COMMUNITY)
Admission: AD | Disposition: A | Discharge status: Home / Self Care | Payer: Self-pay | Source: Ambulatory Visit | Attending: Specialist

## 2016-05-30 DIAGNOSIS — D649 Anemia, unspecified: Secondary | ICD-10-CM | POA: Diagnosis not present

## 2016-05-30 DIAGNOSIS — T8453XA Infection and inflammatory reaction due to internal right knee prosthesis, initial encounter: Secondary | ICD-10-CM | POA: Diagnosis not present

## 2016-05-30 DIAGNOSIS — Z95828 Presence of other vascular implants and grafts: Secondary | ICD-10-CM | POA: Diagnosis not present

## 2016-05-30 DIAGNOSIS — C773 Secondary and unspecified malignant neoplasm of axilla and upper limb lymph nodes: Secondary | ICD-10-CM | POA: Diagnosis not present

## 2016-05-30 DIAGNOSIS — Z7901 Long term (current) use of anticoagulants: Secondary | ICD-10-CM | POA: Diagnosis not present

## 2016-05-30 DIAGNOSIS — R21 Rash and other nonspecific skin eruption: Secondary | ICD-10-CM | POA: Diagnosis not present

## 2016-05-30 DIAGNOSIS — Y792 Prosthetic and other implants, materials and accessory orthopedic devices associated with adverse incidents: Secondary | ICD-10-CM | POA: Diagnosis not present

## 2016-05-30 DIAGNOSIS — B952 Enterococcus as the cause of diseases classified elsewhere: Secondary | ICD-10-CM | POA: Diagnosis not present

## 2016-05-30 DIAGNOSIS — C7989 Secondary malignant neoplasm of other specified sites: Secondary | ICD-10-CM | POA: Diagnosis not present

## 2016-05-30 DIAGNOSIS — N390 Urinary tract infection, site not specified: Secondary | ICD-10-CM | POA: Diagnosis not present

## 2016-05-30 DIAGNOSIS — J329 Chronic sinusitis, unspecified: Secondary | ICD-10-CM | POA: Diagnosis not present

## 2016-05-30 DIAGNOSIS — M009 Pyogenic arthritis, unspecified: Secondary | ICD-10-CM | POA: Diagnosis not present

## 2016-05-30 DIAGNOSIS — T8131XA Disruption of external operation (surgical) wound, not elsewhere classified, initial encounter: Secondary | ICD-10-CM | POA: Diagnosis not present

## 2016-05-30 HISTORY — PX: I & D KNEE WITH POLY EXCHANGE: SHX5024

## 2016-05-30 LAB — BASIC METABOLIC PANEL
Anion gap: 7 (ref 5–15)
BUN: 16 mg/dL (ref 6–20)
CO2: 32 mmol/L (ref 22–32)
Calcium: 8.4 mg/dL — ABNORMAL LOW (ref 8.9–10.3)
Chloride: 99 mmol/L — ABNORMAL LOW (ref 101–111)
Creatinine, Ser: 0.53 mg/dL (ref 0.44–1.00)
Glucose, Bld: 89 mg/dL (ref 65–99)
POTASSIUM: 4.8 mmol/L (ref 3.5–5.1)
SODIUM: 138 mmol/L (ref 135–145)

## 2016-05-30 SURGERY — IRRIGATION AND DEBRIDEMENT KNEE WITH POLY EXCHANGE
Anesthesia: General | Site: Knee | Laterality: Right

## 2016-05-30 MED ORDER — FENTANYL CITRATE (PF) 100 MCG/2ML IJ SOLN
INTRAMUSCULAR | Status: DC | PRN
  Administered 2016-05-30: 50 ug via INTRAVENOUS
  Administered 2016-05-30 (×2): 100 ug via INTRAVENOUS
  Administered 2016-05-30 (×2): 50 ug via INTRAVENOUS

## 2016-05-30 MED ORDER — ONDANSETRON HCL 4 MG/2ML IJ SOLN
INTRAMUSCULAR | Status: AC
Start: 2016-05-30 — End: 2016-05-30
  Filled 2016-05-30: qty 2

## 2016-05-30 MED ORDER — SCOPOLAMINE 1 MG/3DAYS TD PT72
MEDICATED_PATCH | TRANSDERMAL | Status: AC
  Filled 2016-05-30: qty 1

## 2016-05-30 MED ORDER — VANCOMYCIN HCL IN DEXTROSE 1-5 GM/200ML-% IV SOLN
1000.0000 mg | Freq: Once | INTRAVENOUS | Status: AC
  Administered 2016-05-30: 1000 mg via INTRAVENOUS

## 2016-05-30 MED ORDER — SODIUM CHLORIDE 0.9 % IR SOLN
Status: DC | PRN
  Administered 2016-05-30: 500 mL

## 2016-05-30 MED ORDER — MEPERIDINE HCL 50 MG/ML IJ SOLN: 6.2500 mg | INTRAMUSCULAR | Status: DC | PRN

## 2016-05-30 MED ORDER — FENTANYL CITRATE (PF) 100 MCG/2ML IJ SOLN
INTRAMUSCULAR | Status: AC
  Administered 2016-05-30: 50 ug via INTRAVENOUS
  Filled 2016-05-30: qty 2

## 2016-05-30 MED ORDER — FENTANYL CITRATE (PF) 100 MCG/2ML IJ SOLN
INTRAMUSCULAR | Status: AC
  Filled 2016-05-30: qty 2

## 2016-05-30 MED ORDER — SODIUM CHLORIDE 0.9 % IR SOLN
Status: DC | PRN
  Administered 2016-05-30: 3000 mL

## 2016-05-30 MED ORDER — LACTATED RINGERS IV SOLN: INTRAVENOUS | Status: DC

## 2016-05-30 MED ORDER — LIDOCAINE-EPINEPHRINE (PF) 1 %-1:200000 IJ SOLN
INTRAMUSCULAR | Status: AC
  Filled 2016-05-30: qty 30

## 2016-05-30 MED ORDER — LIDOCAINE 2% (20 MG/ML) 5 ML SYRINGE
INTRAMUSCULAR | Status: DC | PRN
  Administered 2016-05-30: 75 mg via INTRAVENOUS

## 2016-05-30 MED ORDER — PROPOFOL 10 MG/ML IV BOLUS
INTRAVENOUS | Status: DC | PRN
  Administered 2016-05-30: 130 mg via INTRAVENOUS

## 2016-05-30 MED ORDER — VANCOMYCIN HCL IN DEXTROSE 1-5 GM/200ML-% IV SOLN
INTRAVENOUS | Status: AC
  Filled 2016-05-30: qty 200

## 2016-05-30 MED ORDER — MIDAZOLAM HCL 2 MG/2ML IJ SOLN
INTRAMUSCULAR | Status: AC
  Filled 2016-05-30: qty 2

## 2016-05-30 MED ORDER — BUPIVACAINE HCL (PF) 0.25 % IJ SOLN
INTRAMUSCULAR | Status: AC
  Filled 2016-05-30: qty 30

## 2016-05-30 MED ORDER — SODIUM CHLORIDE 0.9 % IR SOLN
Status: DC | PRN
  Administered 2016-05-30: 6000 mL

## 2016-05-30 MED ORDER — METOCLOPRAMIDE HCL 5 MG/ML IJ SOLN: 10.0000 mg | Freq: Once | INTRAMUSCULAR | Status: DC | PRN

## 2016-05-30 MED ORDER — VANCOMYCIN HCL IN DEXTROSE 1-5 GM/200ML-% IV SOLN
1000.0000 mg | Freq: Two times a day (BID) | INTRAVENOUS | Status: DC
  Administered 2016-05-31 – 2016-06-01 (×4): 1000 mg via INTRAVENOUS
  Filled 2016-05-30 (×5): qty 200

## 2016-05-30 MED ORDER — HYDROMORPHONE HCL 1 MG/ML IJ SOLN
0.2500 mg | INTRAMUSCULAR | Status: DC | PRN
  Administered 2016-05-30 (×2): 0.5 mg via INTRAVENOUS

## 2016-05-30 MED ORDER — SUCCINYLCHOLINE CHLORIDE 200 MG/10ML IV SOSY
PREFILLED_SYRINGE | INTRAVENOUS | Status: DC | PRN
  Administered 2016-05-30: 100 mg via INTRAVENOUS

## 2016-05-30 MED ORDER — LACTATED RINGERS IV SOLN
INTRAVENOUS | Status: DC
  Administered 2016-05-30: 17:00:00 via INTRAVENOUS

## 2016-05-30 MED ORDER — SCOPOLAMINE 1 MG/3DAYS TD PT72
MEDICATED_PATCH | TRANSDERMAL | Status: DC | PRN
  Administered 2016-05-30: 1 via TRANSDERMAL

## 2016-05-30 MED ORDER — MIDAZOLAM HCL 5 MG/5ML IJ SOLN
INTRAMUSCULAR | Status: DC | PRN
  Administered 2016-05-30: 2 mg via INTRAVENOUS

## 2016-05-30 MED ORDER — KCL IN DEXTROSE-NACL 10-5-0.45 MEQ/L-%-% IV SOLN
INTRAVENOUS | Status: DC
  Administered 2016-05-30 – 2016-06-02 (×6): via INTRAVENOUS
  Filled 2016-05-30 (×7): qty 1000

## 2016-05-30 MED ORDER — DEXAMETHASONE SODIUM PHOSPHATE 10 MG/ML IJ SOLN
INTRAMUSCULAR | Status: AC
  Filled 2016-05-30: qty 1

## 2016-05-30 MED ORDER — SODIUM CHLORIDE 0.9 % IR SOLN
Status: AC
  Filled 2016-05-30: qty 500000

## 2016-05-30 MED ORDER — LACTATED RINGERS IV SOLN
INTRAVENOUS | Status: DC | PRN
  Administered 2016-05-30: 14:00:00 via INTRAVENOUS

## 2016-05-30 MED ORDER — SUCCINYLCHOLINE CHLORIDE 200 MG/10ML IV SOSY
PREFILLED_SYRINGE | INTRAVENOUS | Status: AC
  Filled 2016-05-30: qty 10

## 2016-05-30 MED ORDER — FENTANYL CITRATE (PF) 250 MCG/5ML IJ SOLN
INTRAMUSCULAR | Status: AC
  Filled 2016-05-30: qty 5

## 2016-05-30 MED ORDER — ONDANSETRON HCL 4 MG/2ML IJ SOLN
INTRAMUSCULAR | Status: DC | PRN
  Administered 2016-05-30: 4 mg via INTRAVENOUS

## 2016-05-30 MED ORDER — HYDROMORPHONE HCL 1 MG/ML IJ SOLN
INTRAMUSCULAR | Status: AC
  Administered 2016-05-30: 0.5 mg via INTRAVENOUS
  Filled 2016-05-30: qty 1

## 2016-05-30 MED ORDER — FENTANYL CITRATE (PF) 100 MCG/2ML IJ SOLN
25.0000 ug | INTRAMUSCULAR | Status: DC | PRN
  Administered 2016-05-30 (×3): 50 ug via INTRAVENOUS

## 2016-05-30 MED ORDER — ROCURONIUM BROMIDE 50 MG/5ML IV SOSY
PREFILLED_SYRINGE | INTRAVENOUS | Status: AC
  Filled 2016-05-30: qty 5

## 2016-05-30 MED ORDER — DEXAMETHASONE SODIUM PHOSPHATE 10 MG/ML IJ SOLN
INTRAMUSCULAR | Status: DC | PRN
  Administered 2016-05-30: 10 mg via INTRAVENOUS

## 2016-05-30 MED ORDER — LIDOCAINE 2% (20 MG/ML) 5 ML SYRINGE
INTRAMUSCULAR | Status: AC
  Filled 2016-05-30: qty 5

## 2016-05-30 MED ORDER — PROPOFOL 10 MG/ML IV BOLUS
INTRAVENOUS | Status: AC
  Filled 2016-05-30: qty 20

## 2016-05-30 MED ORDER — APIXABAN 2.5 MG PO TABS
2.5000 mg | ORAL_TABLET | Freq: Two times a day (BID) | ORAL | Status: AC
  Administered 2016-05-31 (×2): 2.5 mg via ORAL
  Filled 2016-05-30 (×4): qty 1

## 2016-05-30 MED ORDER — BUPIVACAINE HCL 0.25 % IJ SOLN
INTRAMUSCULAR | Status: DC | PRN
  Administered 2016-05-30: 20 mL

## 2016-05-30 SURGICAL SUPPLY — 63 items
ATTUNE PSRP INSR SZ5 5 KNEE (Insert) ×2 IMPLANT
BAG SPEC THK2 15X12 ZIP CLS (MISCELLANEOUS) ×1
BAG ZIPLOCK 12X15 (MISCELLANEOUS) ×2 IMPLANT
BANDAGE ACE 6X5 VEL STRL LF (GAUZE/BANDAGES/DRESSINGS) ×1 IMPLANT
BANDAGE ELASTIC 6 VELCRO ST LF (GAUZE/BANDAGES/DRESSINGS) ×1 IMPLANT
BANDAGE ESMARK 6X9 LF (GAUZE/BANDAGES/DRESSINGS) ×1 IMPLANT
BNDG CMPR 9X6 STRL LF SNTH (GAUZE/BANDAGES/DRESSINGS) ×1
BNDG ESMARK 6X9 LF (GAUZE/BANDAGES/DRESSINGS) ×2
CHLORAPREP W/TINT 26ML (MISCELLANEOUS) IMPLANT
CLOTH 2% CHLOROHEXIDINE 3PK (PERSONAL CARE ITEMS) IMPLANT
CUFF TOURN SGL QUICK 34 (TOURNIQUET CUFF) ×2
CUFF TRNQT CYL 34X4X40X1 (TOURNIQUET CUFF) ×1 IMPLANT
DRAPE EXTREMITY T 121X128X90 (DRAPE) ×2 IMPLANT
DRAPE POUCH INSTRU U-SHP 10X18 (DRAPES) ×2 IMPLANT
DRAPE U-SHAPE 47X51 STRL (DRAPES) ×2 IMPLANT
DRSG ADAPTIC 3X8 NADH LF (GAUZE/BANDAGES/DRESSINGS) ×2 IMPLANT
DRSG PAD ABDOMINAL 8X10 ST (GAUZE/BANDAGES/DRESSINGS) IMPLANT
DRSG VAC ATS SM SENSATRAC (GAUZE/BANDAGES/DRESSINGS) ×2 IMPLANT
DURAPREP 26ML APPLICATOR (WOUND CARE) ×1 IMPLANT
ELECT REM PT RETURN 9FT ADLT (ELECTROSURGICAL) ×2
ELECTRODE REM PT RTRN 9FT ADLT (ELECTROSURGICAL) ×1 IMPLANT
EVACUATOR 1/8 PVC DRAIN (DRAIN) ×1 IMPLANT
FACESHIELD WRAPAROUND (MASK) ×10 IMPLANT
FACESHIELD WRAPAROUND OR TEAM (MASK) ×5 IMPLANT
GAUZE SPONGE 4X4 12PLY STRL (GAUZE/BANDAGES/DRESSINGS) ×2 IMPLANT
GLOVE BIOGEL PI IND STRL 7.0 (GLOVE) ×1 IMPLANT
GLOVE BIOGEL PI IND STRL 8 (GLOVE) ×1 IMPLANT
GLOVE BIOGEL PI INDICATOR 7.0 (GLOVE) ×1
GLOVE BIOGEL PI INDICATOR 8 (GLOVE) ×1
GLOVE SURG SS PI 7.0 STRL IVOR (GLOVE) ×2 IMPLANT
GLOVE SURG SS PI 7.5 STRL IVOR (GLOVE) ×2 IMPLANT
GLOVE SURG SS PI 8.0 STRL IVOR (GLOVE) ×4 IMPLANT
GOWN STRL REUS W/TWL LRG LVL3 (GOWN DISPOSABLE) ×2 IMPLANT
GOWN STRL REUS W/TWL XL LVL3 (GOWN DISPOSABLE) ×4 IMPLANT
HANDPIECE INTERPULSE COAX TIP (DISPOSABLE) ×2
IMMOBILIZER KNEE 20 (SOFTGOODS) ×2
IMMOBILIZER KNEE 20 THIGH 36 (SOFTGOODS) ×1 IMPLANT
KIT BASIN OR (CUSTOM PROCEDURE TRAY) ×2 IMPLANT
KIT STIMULAN RAPID CURE  10CC (Orthopedic Implant) ×1 IMPLANT
KIT STIMULAN RAPID CURE 10CC (Orthopedic Implant) IMPLANT
MANIFOLD NEPTUNE II (INSTRUMENTS) ×2 IMPLANT
NS IRRIG 1000ML POUR BTL (IV SOLUTION) ×1 IMPLANT
PACK TOTAL JOINT (CUSTOM PROCEDURE TRAY) ×2 IMPLANT
PADDING CAST COTTON 6X4 STRL (CAST SUPPLIES) ×4 IMPLANT
POSITIONER SURGICAL ARM (MISCELLANEOUS) ×2 IMPLANT
SET HNDPC FAN SPRY TIP SCT (DISPOSABLE) ×1 IMPLANT
SPONGE LAP 18X18 X RAY DECT (DISPOSABLE) ×2 IMPLANT
STAPLER VISISTAT 35W (STAPLE) ×1 IMPLANT
SUCTION FRAZIER HANDLE 12FR (TUBING) ×1
SUCTION TUBE FRAZIER 12FR DISP (TUBING) ×1 IMPLANT
SUT ETHILON 3 0 PS 1 (SUTURE) ×10 IMPLANT
SUT VIC AB 0 CT1 27 (SUTURE) ×4
SUT VIC AB 0 CT1 27XBRD ANTBC (SUTURE) ×2 IMPLANT
SUT VIC AB 1 CT1 27 (SUTURE) ×8
SUT VIC AB 1 CT1 27XBRD ANTBC (SUTURE) ×4 IMPLANT
SUT VIC AB 2-0 CT1 27 (SUTURE) ×8
SUT VIC AB 2-0 CT1 TAPERPNT 27 (SUTURE) ×3 IMPLANT
SWAB COLLECTION DEVICE MRSA (MISCELLANEOUS) IMPLANT
SWAB CULTURE ESWAB REG 1ML (MISCELLANEOUS) ×1 IMPLANT
TRAY FOLEY CATH 14FRSI W/METER (CATHETERS) ×1 IMPLANT
TRAY PREP A LATEX SAFE STRL (SET/KITS/TRAYS/PACK) ×1 IMPLANT
WATER STERILE IRR 1500ML POUR (IV SOLUTION) IMPLANT
WRAP KNEE MAXI GEL POST OP (GAUZE/BANDAGES/DRESSINGS) ×2 IMPLANT

## 2016-05-30 NOTE — Progress Notes (Signed)
Subjective: 2 Days Post-Op Procedure(s) (LRB): IRRIGATION AND DEBRIDEMENT KNEE WOUND VAC PLACMENT (Right) Patient reports pain as mild.  Reports pain well controlled.  Objective: Vital signs in last 24 hours: Temp:  [97.8 F (36.6 C)-98.5 F (36.9 C)] 98.3 F (36.8 C) (01/05 0611) Pulse Rate:  [87-103] 87 (01/05 0611) Resp:  [18] 18 (01/05 0611) BP: (114-128)/(53-73) 128/70 (01/05 0611) SpO2:  [96 %-98 %] 98 % (01/05 0611)  Intake/Output from previous day: 01/04 0701 - 01/05 0700 In: 1380 [P.O.:1380] Out: 2625 [Urine:2625] Intake/Output this shift: No intake/output data recorded.   Recent Labs  05/29/16 0443  HGB 10.4*    Recent Labs  05/29/16 0443  WBC 2.6*  RBC 3.49*  HCT 32.0*  PLT 198    Recent Labs  05/29/16 0443 05/30/16 0517  NA 135 138  K 3.6 4.8  CL 99* 99*  CO2 27 32  BUN 16 16  CREATININE 0.50 0.53  GLUCOSE 113* 89  CALCIUM 8.7* 8.4*    Recent Labs  05/28/16 1105  INR 1.06    Neurologically intact ABD soft Neurovascular intact Sensation intact distally Intact pulses distally Dorsiflexion/Plantar flexion intact Incision: dressing C/D/I No cellulitis present Compartment soft no sign of DVT  Assessment/Plan: 2 Days Post-Op Procedure(s) (LRB): IRRIGATION AND DEBRIDEMENT KNEE WOUND VAC PLACMENT (Right) Advance diet Up with therapy D/C IV fluids  Cx of knee aspirate intra-op with no growth, final cx pending Keep NPO for OR today- I&D R knee and poly exchange Received Lovenox last night - to hold anticoags today in anticipation of surgery Discussed plan with pt last night and today Will discuss with Dr. Mliss Fritz, Con[place] Rolls. 05/30/2016, 8:20 AM

## 2016-05-30 NOTE — Interval H&P Note (Signed)
History and Physical Interval Note:  05/30/2016 2:38 PM  [name]  has presented today for surgery, with the diagnosis of infected right total knee  The various methods of treatment have been discussed with the patient and family. After consideration of risks, benefits and other options for treatment, the patient has consented to  Procedure(s): IRRIGATION AND DEBRIDEMENT KNEE WITH POLY EXCHANGE (Right) as a surgical intervention .  The patient's history has been reviewed, patient examined, no change in status, stable for surgery.  I have reviewed the patient's chart and labs.  Questions were answered to the patient's satisfaction.     Deandra Gadson C

## 2016-05-30 NOTE — H&P (View-Only) (Signed)
Subjective: 2 Days Post-Op Procedure(s) (LRB): IRRIGATION AND DEBRIDEMENT KNEE WOUND VAC PLACMENT (Right) Patient reports pain as mild.  Reports pain well controlled.  Objective: Vital signs in last 24 hours: Temp:  [97.8 F (36.6 C)-98.5 F (36.9 C)] 98.3 F (36.8 C) (01/05 0611) Pulse Rate:  [87-103] 87 (01/05 0611) Resp:  [18] 18 (01/05 0611) BP: (114-128)/(53-73) 128/70 (01/05 0611) SpO2:  [96 %-98 %] 98 % (01/05 0611)  Intake/Output from previous day: 01/04 0701 - 01/05 0700 In: 1380 [P.O.:1380] Out: 2625 [Urine:2625] Intake/Output this shift: No intake/output data recorded.   Recent Labs  05/29/16 0443  HGB 10.4*    Recent Labs  05/29/16 0443  WBC 2.6*  RBC 3.49*  HCT 32.0*  PLT 198    Recent Labs  05/29/16 0443 05/30/16 0517  NA 135 138  K 3.6 4.8  CL 99* 99*  CO2 27 32  BUN 16 16  CREATININE 0.50 0.53  GLUCOSE 113* 89  CALCIUM 8.7* 8.4*    Recent Labs  05/28/16 1105  INR 1.06    Neurologically intact ABD soft Neurovascular intact Sensation intact distally Intact pulses distally Dorsiflexion/Plantar flexion intact Incision: dressing C/D/I No cellulitis present Compartment soft no sign of DVT  Assessment/Plan: 2 Days Post-Op Procedure(s) (LRB): IRRIGATION AND DEBRIDEMENT KNEE WOUND VAC PLACMENT (Right) Advance diet Up with therapy D/C IV fluids  Cx of knee aspirate intra-op with no growth, final cx pending Keep NPO for OR today- I&D R knee and poly exchange Received Lovenox last night - to hold anticoags today in anticipation of surgery Discussed plan with pt last night and today Will discuss with Dr. Mliss Fritz, Con[place] Rolls. 05/30/2016, 8:20 AM

## 2016-05-30 NOTE — Anesthesia Preprocedure Evaluation (Signed)
Anesthesia Evaluation  Patient identified by MRN, date of birth, ID band Patient awake    Reviewed: Allergy & Precautions, NPO status , Patient's Chart, lab work & pertinent test results  History of Anesthesia Complications (+) PONV and history of anesthetic complications  Airway Mallampati: II  TM Distance: >3 FB Neck ROM: Full    Dental  (+) Teeth Intact, Dental Advisory Given, Caps   Pulmonary neg pulmonary ROS,    Pulmonary exam normal breath sounds clear to auscultation       Cardiovascular Normal cardiovascular exam+ Valvular Problems/Murmurs MR  Rhythm:Regular Rate:Normal  Echo 10/17: Study Conclusions  - Left ventricle: The cavity size was normal. Wall thickness was normal. Systolic function was normal. The estimated ejection fraction was in the range of 60% to 65%. Doppler parameters are consistent with abnormal left ventricular relaxation (grade 1 diastolic dysfunction). - Mitral valve: There was mild regurgitation. - Pulmonary arteries: PA peak pressure: 49 mm Hg (S).  Impressions:  - GLS -15.2 (underestimated due to poor endocardial tracking). Lateral s&' 11.8 cm/s.   Neuro/Psych PSYCHIATRIC DISORDERS Anxiety  Neuromuscular disease    GI/Hepatic negative GI ROS, Neg liver ROS,   Endo/Other  negative endocrine ROS  Renal/GU negative Renal ROS     Musculoskeletal  (+) Arthritis , Osteoarthritis,    Abdominal   Peds  Hematology  (+) Blood dyscrasia (Eliquis), anemia ,   Anesthesia Other Findings Day of surgery medications reviewed with the patient.  Breast cancer s/p bilateral mastectomy, chemotherapy  Reproductive/Obstetrics                             Anesthesia Physical  Anesthesia Plan  ASA: III  Anesthesia Plan: General   Post-op Pain Management:    Induction: Intravenous  Airway Management Planned: LMA  Additional Equipment:   Intra-op Plan:    Post-operative Plan: Extubation in OR  Informed Consent: I have reviewed the patients History and Physical, chart, labs and discussed the procedure including the risks, benefits and alternatives for the proposed anesthesia with the patient or authorized representative who has indicated his/her understanding and acceptance.   Dental advisory given  Plan Discussed with: CRNA  Anesthesia Plan Comments: (Risks/benefits of general anesthesia discussed with patient including risk of damage to teeth, lips, gum, and tongue, nausea/vomiting, allergic reactions to medications, and the possibility of heart attack, stroke and death.  All patient questions answered.  Patient wishes to proceed.)        Anesthesia Quick Evaluation

## 2016-05-30 NOTE — Consult Note (Signed)
   [facility] CM Inpatient Consult   05/30/2016  DELLENE CUMBERLEDGE 10/17/1955 [id number]      Came to visit Mrs. Rebert on behalf of [place] to Wellness program for [employer] employees/dependents with [facility] insurance. Mrs. Winkler is familiar with the program and denies having any needs. Shared with her new information about the [place] program for DM management for [employer]. She endorses she does not have DM. Mrs. Bester states she remembers her last post discharge follow up call and states she does not need another post discharge call from [place] to [place]. Appreciative of visit and previous calls and information however. Provided 24-hr nurse line magnet as well. Will make Telephonic RNCM aware that Mrs. Paige declines discharge follow up at this time.    Marthenia Rolling, MSN-Ed, RN,BSN [facility] Liaison [phone]

## 2016-05-30 NOTE — Progress Notes (Signed)
Pharmacy Antibiotic Note  [name] is a 61 y.o. female admitted on 05/28/2016 for repeat I&D, wound vac placement, and poly exchange of knee wound s/p TKA 03/20/16.  S/p I&D procedures on 1/3 and on 1/5.  Home medications include cipro, and continued inpatient through 05/29/16.  Pharmacy has been consulted for Vancomycin dosing for wound infection.  Plan:  Vancomycin 1g IV given preop today at 14:00  Vancomycin 1g IV q12h, next dose due at 02:00 05/31/16  Measure Vanc trough at steady state.  Follow up renal fxn, culture results, and clinical course.   Height: 5\' 7"  (170.2 cm) Weight: 155 lb (70.3 kg) IBW/kg (Calculated) : 61.6  Temp (24hrs), Avg:98.1 F (36.7 C), Min:97.7 F (36.5 C), Max:98.5 F (36.9 C)   Recent Labs Lab 05/29/16 0443 05/30/16 0517  WBC 2.6*  --   CREATININE 0.50 0.53    Estimated Creatinine Clearance: 72.7 mL/min (by C-G formula based on SCr of 0.53 mg/dL).    Allergies  Allergen Reactions  . Tape Hives    Can tolerate paper tape  . Other Rash    STERI STRIPS - Blisters  . Penicillins Rash    Denies airway involvement Has patient had a PCN reaction causing immediate rash, facial/tongue/throat swelling, SOB or lightheadedness with hypotension: No Has patient had a PCN reaction causing severe rash involving mucus membranes or skin necrosis: No Has patient had a PCN reaction that required hospitalization No Has patient had a PCN reaction occurring within the last 10 years: No If all of the above answers are "NO", then may proceed with Cephalosporin use.      Antimicrobials this admission: 1/5 Vancomycin >>  Dose adjustments this admission:  Microbiology results: 12/16 BCx: NGF 12/16 UCx: <10k colonies, insignificant 1/3 Joint fluid culture:  RARE ENTEROCOCCUS FAECALIS  Thank you for allowing pharmacy to be a part of this patient's care.  Gretta Arab PharmD, BCPS Pager [phone] 05/30/2016 7:42 PM

## 2016-05-30 NOTE — Anesthesia Postprocedure Evaluation (Signed)
Anesthesia Post Note  Patient: [name]  Procedure(s) Performed: Procedure(s) (LRB): RADICAL SYNOVECTOMY,IRRIGATION AND DEBRIDEMENT KNEE WITH POLY EXCHANGE WITH ANTIBIOTIC BEADS, APPLICATION OF WOUND VAC (Right)  Patient location during evaluation: PACU Anesthesia Type: General Level of consciousness: awake and alert Pain management: pain level controlled Vital Signs Assessment: post-procedure vital signs reviewed and stable Respiratory status: spontaneous breathing, nonlabored ventilation, respiratory function stable and patient connected to nasal cannula oxygen Cardiovascular status: blood pressure returned to baseline and stable Postop Assessment: no signs of nausea or vomiting Anesthetic complications: no       Last Vitals:  Vitals:   05/30/16 1745 05/30/16 1800  BP: 134/75 135/73  Pulse: 93 91  Resp: 12 14  Temp:      Last Pain:  Vitals:   05/30/16 1800  TempSrc:   PainSc: 4         RLE Motor Response: Purposeful movement (05/30/16 1800) RLE Sensation: No numbness;No tingling;Pain (05/30/16 1800)      Montez Hageman

## 2016-05-30 NOTE — Anesthesia Procedure Notes (Signed)
Procedure Name: Intubation Date/Time: 05/30/2016 2:39 PM Performed by: Lollie Sails Pre-anesthesia Checklist: Patient identified, Emergency Drugs available, Suction available, Patient being monitored and Timeout performed Patient Re-evaluated:Patient Re-evaluated prior to inductionOxygen Delivery Method: Circle system utilized Preoxygenation: Pre-oxygenation with 100% oxygen Intubation Type: IV induction Ventilation: Mask ventilation without difficulty Laryngoscope Size: Miller and 3 Grade View: Grade III Tube type: Oral Number of attempts: 2 Airway Equipment and Method: Bougie stylet Placement Confirmation: ETT inserted through vocal cords under direct vision,  positive ETCO2 and breath sounds checked- equal and bilateral Secured at: 22 cm Tube secured with: Tape Dental Injury: Teeth and Oropharynx as per pre-operative assessment  Comments: Base of laryngeal opening visualized - bougie passed with endotube placed smoothly.

## 2016-05-30 NOTE — Brief Op Note (Signed)
05/28/2016 - 05/30/2016  4:48 PM  PATIENT:  [name]  61 y.o. female  PRE-OPERATIVE DIAGNOSIS:  infected right total knee  POST-OPERATIVE DIAGNOSIS:  infected right total knee  PROCEDURE:  Procedure(s): RADICAL SYNOVECTOMY,IRRIGATION AND DEBRIDEMENT KNEE WITH POLY EXCHANGE WITH ANTIBIOTIC BEADS, APPLICATION OF WOUND VAC (Right)  SURGEON:  Surgeon(s) and Role:    * Susa Day, MD - Primary  PHYSICIAN ASSISTANT:   ASSISTANTS: Bissell   ANESTHESIA:   general  EBL:  Total I/O In: 0  Out: 550 [Urine:350; Blood:200]  BLOOD ADMINISTERED:none  DRAINS: none   LOCAL MEDICATIONS USED:  MARCAINE     SPECIMEN:  No Specimen  DISPOSITION OF SPECIMEN:  N/A  COUNTS:  YES  TOURNIQUET:   Total Tourniquet Time Documented: Thigh (Right) - 48 minutes Total: Thigh (Right) - 48 minutes   DICTATION: .Other Dictation: Dictation Number [id number]  PLAN OF CARE: Admit to inpatient   PATIENT DISPOSITION:  PACU - hemodynamically stable.   Delay start of Pharmacological VTE agent (>24hrs) due to surgical blood loss or risk of bleeding: no

## 2016-05-30 NOTE — Transfer of Care (Signed)
Immediate Anesthesia Transfer of Care Note  Patient: ADELINNE PINEAU  Procedure(s) Performed: Procedure(s): RADICAL SYNOVECTOMY,IRRIGATION AND DEBRIDEMENT KNEE WITH POLY EXCHANGE WITH ANTIBIOTIC BEADS, APPLICATION OF WOUND VAC (Right)  Patient Location: PACU  Anesthesia Type:General  Level of Consciousness: awake, alert , oriented and patient cooperative  Airway & Oxygen Therapy: Patient Spontanous Breathing and Patient connected to face mask oxygen  Post-op Assessment: Report given to RN and Post -op Vital signs reviewed and stable  Post vital signs: Reviewed and stable  Last Vitals:  Vitals:   05/29/16 2129 05/30/16 0611  BP: 116/65 128/70  Pulse: 89 87  Resp: 18 18  Temp: 36.9 C 36.8 C    Last Pain:  Vitals:   05/30/16 1411  TempSrc:   PainSc: 3       Patients Stated Pain Goal: 3 ([id number])  Complications: No apparent anesthesia complications

## 2016-05-31 DIAGNOSIS — B952 Enterococcus as the cause of diseases classified elsewhere: Secondary | ICD-10-CM

## 2016-05-31 DIAGNOSIS — Z95828 Presence of other vascular implants and grafts: Secondary | ICD-10-CM

## 2016-05-31 DIAGNOSIS — Z91048 Other nonmedicinal substance allergy status: Secondary | ICD-10-CM

## 2016-05-31 DIAGNOSIS — Y792 Prosthetic and other implants, materials and accessory orthopedic devices associated with adverse incidents: Secondary | ICD-10-CM

## 2016-05-31 DIAGNOSIS — Z880 Allergy status to penicillin: Secondary | ICD-10-CM

## 2016-05-31 DIAGNOSIS — Z96651 Presence of right artificial knee joint: Secondary | ICD-10-CM

## 2016-05-31 DIAGNOSIS — T8450XS Infection and inflammatory reaction due to unspecified internal joint prosthesis, sequela: Secondary | ICD-10-CM

## 2016-05-31 DIAGNOSIS — T8453XA Infection and inflammatory reaction due to internal right knee prosthesis, initial encounter: Principal | ICD-10-CM

## 2016-05-31 LAB — BASIC METABOLIC PANEL
ANION GAP: 7 (ref 5–15)
BUN: 11 mg/dL (ref 6–20)
CO2: 33 mmol/L — AB (ref 22–32)
Calcium: 9.1 mg/dL (ref 8.9–10.3)
Chloride: 95 mmol/L — ABNORMAL LOW (ref 101–111)
Creatinine, Ser: 0.45 mg/dL (ref 0.44–1.00)
GFR calc Af Amer: >60 mL/min (ref >60)
GFR calc non Af Amer: >60 mL/min (ref >60)
GLUCOSE: 136 mg/dL — AB (ref 65–99)
POTASSIUM: 3.7 mmol/L (ref 3.5–5.1)
Sodium: 135 mmol/L (ref 135–145)

## 2016-05-31 LAB — CBC
HEMATOCRIT: 28.9 % — AB (ref 36.0–46.0)
HEMOGLOBIN: 9.4 g/dL — AB (ref 12.0–15.0)
MCH: 29.8 pg (ref 26.0–34.0)
MCHC: 32.5 g/dL (ref 30.0–36.0)
MCV: 91.7 fL (ref 78.0–100.0)
Platelets: 186 10*3/uL (ref 150–400)
RBC: 3.15 MIL/uL — ABNORMAL LOW (ref 3.87–5.11)
RDW: 16.3 % — AB (ref 11.5–15.5)
WBC: 5.2 10*3/uL (ref 4.0–10.5)

## 2016-05-31 MED ORDER — APIXABAN 5 MG PO TABS
5.0000 mg | ORAL_TABLET | Freq: Two times a day (BID) | ORAL | Status: DC
  Administered 2016-06-01 – 2016-06-03 (×5): 5 mg via ORAL
  Filled 2016-05-31 (×5): qty 1

## 2016-05-31 NOTE — Consult Note (Signed)
[place] for Infectious Disease    Date of Admission:  05/28/2016           Day 2 vancomycin       Reason for Consult: Enterococcal right prosthetic knee infection    Referring Physician: Dr. Erasmo Score   Principal Problem:   Infection of prosthetic right knee joint [facility]) Active Problems:   Wound dehiscence, surgical   S/P total knee arthroplasty, right   Right knee DJD   Nonhealing surgical wound   . apixaban  2.5 mg Oral BID  . [START ON 06/01/2016] apixaban  5 mg Oral BID  . cholecalciferol  2,000 Units Oral Once per day on Sun Tue Thu Sat  . darifenacin  15 mg Oral Daily  . feeding supplement (ENSURE ENLIVE)  237 mL Oral BID BM  . gabapentin  300 mg Oral TID  . polyethylene glycol  17 g Oral Daily  . sodium chloride flush  10-40 mL Intracatheter Q12H  . traMADol  50 mg Oral Q6H  . vancomycin  1,000 mg Intravenous Q12H    Recommendations: 1. Continue vancomycin for 6 weeks through her Port-A-Cath 2. I will arrange follow-up in my clinic in one month  Diagnosis: Enterococcal right prosthetic knee infection  Culture Result: Enterococcus faecalis  Allergies  Allergen Reactions  . Tape Hives    Can tolerate paper tape  . Other Rash    STERI STRIPS - Blisters  . Penicillins Rash    Denies airway involvement Has patient had a PCN reaction causing immediate rash, facial/tongue/throat swelling, SOB or lightheadedness with hypotension: No Has patient had a PCN reaction causing severe rash involving mucus membranes or skin necrosis: No Has patient had a PCN reaction that required hospitalization No Has patient had a PCN reaction occurring within the last 10 years: No If all of the above answers are "NO", then may proceed with Cephalosporin use.      Discharge antibiotics: Per pharmacy protocol Vancomycin Aim for Vancomycin trough 15-20 (unless otherwise indicated) Duration: 6 weeks End Date: 07/11/2016  [facility] Care Per Protocol: She has a  Port-A-Cath  Labs weekly while on IV antibiotics: _x_ CBC with differential _x_ BMP __ CMP _x_ CRP _x_ ESR _x_ Vancomycin trough  _NA_ Please pull PIC at completion of IV antibiotics _NA_ Please leave PIC in place until doctor has seen patient or been notified  Fax weekly labs to [phone]  Clinic Follow Up Appt: 1 month at [facility]  Assessment: [name] has developed an enterococcus infection of her right prosthetic knee. IV vancomycin is the drug of choice given her history of amoxicillin allergy. Since it is unlikely that this will be cured with IV antibiotic therapy she will need some form of longer-term suppressive therapy. However options for chronic suppressive therapy are quite limited given her amoxicillin allergy. She could take oral linezolid but this is not safe for more than 3-4 weeks of therapy because of concerns about bone marrow suppression, painful peripheral neuropathy and optic neuritis. We could consider using intermittent infusions of a long-acting oritavancin. We will also need to consider desensitization to penicillin. I will arrange follow-up in my clinic within the next few weeks.   HPI: [name] is a 61 y.o. female with DJD who underwent right total hip arthroplasty on 03/20/2016. She did well initially but suffered a fall and had dehiscence of her wound. She underwent incision and drainage and closure of the wound on 04/12/2016.  She was discharged on a short course of cephalexin. She had persistent wound drainage and was readmitted on 05/10/2016. She had some cellulitis around the wound. She was treated with IV vancomycin and aztreonam then transitioned to oral ciprofloxacin for presumed UTI although she did not have any symptoms of a UTI. She took the ciprofloxacin for 7 days. She had persistent wound drainage and low-grade fevers leading to readmission on 05/28/2016. She underwent incision and drainage and poly-exchange yesterday and had a VAC wound dressing  placed. Synovial fluid cultures have grown Enterococcus faecalis. 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 She has a history of allergy to amoxicillin. She developed a rash when she took it as a young girl. She also took it again in her late 9s and developed a diffuse, pruritic red rash.   Review of Systems: Review of Systems  Constitutional: Positive for fever and malaise/fatigue. Negative for chills, diaphoresis and weight loss.  HENT: Negative for sore throat.   Respiratory: Negative for cough, sputum production and shortness of breath.   Cardiovascular: Negative for chest pain.  Gastrointestinal: Negative for abdominal pain, diarrhea, heartburn, nausea and vomiting.  Genitourinary: Negative for dysuria and frequency.  Musculoskeletal: Positive for joint pain. Negative for myalgias.  Skin: Negative for rash.  Neurological: Negative for dizziness and headaches.    Past Medical History:  Diagnosis Date  . 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 Arthritis KNEES   right knee, hx. past left knee replacemnt.  . Blood clot in vein    around Portacath right chest-tx. Eliquis about a yr., prior Lovenox.  . Blood dyscrasia   . Chronic anticoagulation HX  PORT-A-CATH CLOT   2010 - HAS BEEN ON LOVENOX SINCE THE CLOT  . Elevated blood pressure reading without diagnosis of hypertension    PT MONITORS HER B/P AT HOME  . Heart murmur   . 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 PONV (postoperative nausea and vomiting)   . Skin cancer of anterior chest BREAST CANCER PRIMARY W/ METS TO CHEST WALL SKIN CANCER--  CHEMOTHERAPY EVERY 3 WEEKS AT [facility]  . Status post skin flap graft    right chest wall with metastatis right anterior chest -no drainage or open wound.    Social  History  Substance Use Topics  . Smoking status: Never Smoker  . Smokeless tobacco: Never Used  . Alcohol use No    Family History  Problem Relation Age of Onset  . Heart disease Mother     due to mitral valve regurgiation,   . Cancer Mother     breast  . Parkinsonism Father   . Alcohol abuse Paternal Grandfather    Allergies  Allergen Reactions  . Tape Hives    Can tolerate paper tape  . Other Rash    STERI STRIPS - Blisters  . Penicillins Rash    Denies airway involvement Has patient had a PCN reaction causing immediate rash, facial/tongue/throat swelling, SOB or lightheadedness with hypotension: No Has patient had a PCN reaction causing severe rash involving mucus membranes or skin necrosis: No Has patient had a PCN reaction that required hospitalization No Has patient had a PCN reaction occurring within the last 10 years: No If all of the above answers are "NO", then  may proceed with Cephalosporin use.      OBJECTIVE: Blood pressure (!) 116/57, pulse (!) 103, temperature 98.9 F (37.2 C), temperature source Oral, resp. rate 20, height '5\' 7"'$  (1.702 m), weight 155 lb (70.3 kg), last menstrual period 06/22/2007, SpO2 96 %.  Physical Exam  Constitutional: She is oriented to person, place, and time.  She is pleasant and in no distress. Her family is present.  HENT:  Mouth/Throat: No oropharyngeal exudate.  Eyes: Conjunctivae are normal.  Cardiovascular: Normal rate and regular rhythm.   No murmur heard. Pulmonary/Chest: Effort normal and breath sounds normal. She has no wheezes. She has no rales.  Status post bilateral mastectomies. Right upper chest Port-A-Cath site looks good.  Abdominal: Soft. She exhibits no mass. There is no tenderness.  Musculoskeletal:  Her right knee is in a soft splint  Neurological: She is alert and oriented to person, place, and time.  Skin: No rash noted.  Psychiatric: Mood and affect normal.    Lab Results Lab Results  Component  Value Date   WBC 5.2 05/31/2016   HGB 9.4 (L) 05/31/2016   HCT 28.9 (L) 05/31/2016   MCV 91.7 05/31/2016   PLT 186 05/31/2016    Lab Results  Component Value Date   CREATININE 0.45 05/31/2016   BUN 11 05/31/2016   NA 135 05/31/2016   K 3.7 05/31/2016   CL 95 (L) 05/31/2016   CO2 33 (H) 05/31/2016    Lab Results  Component Value Date   ALT 28 05/29/2016   AST 59 (H) 05/29/2016   ALKPHOS 111 05/29/2016   BILITOT 0.6 05/29/2016     Microbiology: Recent Results (from the past 240 hour(s))  Surgical pcr screen     Status: None   Collection Time: 05/28/16 10:37 AM  Result Value Ref Range Status   MRSA, PCR NEGATIVE NEGATIVE Final   Staphylococcus aureus NEGATIVE NEGATIVE Final    Comment:        The Xpert SA Assay (FDA approved for NASAL specimens in patients over 25 years of age), is one component of a comprehensive surveillance program.  Test performance has been validated by [facility] for patients greater than or equal to 10 year old. It is not intended to diagnose infection nor to guide or monitor treatment.   Anaerobic culture     Status: None (Preliminary result)   Collection Time: 05/28/16  2:00 PM  Result Value Ref Range Status   Specimen Description JOINT FLUID  Final   Special Requests NONE  Final   Gram Stain PENDING  Incomplete   Culture   Final    NO ANAEROBES ISOLATED; CULTURE IN PROGRESS FOR 5 DAYS   Report Status PENDING  Incomplete  Body fluid culture     Status: None   Collection Time: 05/28/16  2:00 PM  Result Value Ref Range Status   Specimen Description JOINT FLUID  Final   Special Requests NONE  Final   Gram Stain   Final    FEW WBC PRESENT, PREDOMINANTLY PMN NO ORGANISMS SEEN Gram Stain Report Called to,Read Back By and Verified With: HOLLY COBLE AT 4239 05/28/16 BY THOMPSON,N.    Culture   Final    RARE ENTEROCOCCUS FAECALIS CRITICAL RESULT CALLED TO, READ BACK BY AND VERIFIED WITH: DR Tonita Cong AT 1503 05/30/16 BY L BENFIELD Performed at  [facility]    Report Status 05/31/2016 FINAL  Final   Organism ID, Bacteria ENTEROCOCCUS FAECALIS  Final  Susceptibility   Enterococcus faecalis - MIC*    AMPICILLIN <=2 SENSITIVE Sensitive     VANCOMYCIN 1 SENSITIVE Sensitive     GENTAMICIN SYNERGY SENSITIVE Sensitive     * RARE ENTEROCOCCUS FAECALIS    Michel Bickers, MD [place] for [facility] [phone] pager   [phone] cell 05/31/2016, 5:11 PM

## 2016-05-31 NOTE — Progress Notes (Addendum)
Subjective: 1 Day Post-Op Procedure(s) (LRB): RADICAL SYNOVECTOMY,IRRIGATION AND DEBRIDEMENT KNEE WITH POLY EXCHANGE WITH ANTIBIOTIC BEADS, APPLICATION OF WOUND VAC (Right) Patient reports pain as mild.  Reports pain well controlled. Feeling well this AM.  Objective: Vital signs in last 24 hours: Temp:  [97.7 F (36.5 C)-99 F (37.2 C)] 98.3 F (36.8 C) (01/06 0545) Pulse Rate:  [88-104] 94 (01/06 0545) Resp:  [11-18] 16 (01/06 0545) BP: (106-152)/(57-80) 106/62 (01/06 0545) SpO2:  [98 %-100 %] 100 % (01/06 0545)  Intake/Output from previous day: 01/05 0701 - 01/06 0700 In: 1868.8 [I.V.:1668.8; IV Piggyback:200] Out: [id number] [Urine:3235; Blood:200] Intake/Output this shift: No intake/output data recorded.   Recent Labs  05/29/16 0443 05/31/16 0340  HGB 10.4* 9.4*    Recent Labs  05/29/16 0443 05/31/16 0340  WBC 2.6* 5.2  RBC 3.49* 3.15*  HCT 32.0* 28.9*  PLT 198 186    Recent Labs  05/30/16 0517 05/31/16 0340  NA 138 135  K 4.8 3.7  CL 99* 95*  CO2 32 33*  BUN 16 11  CREATININE 0.53 0.45  GLUCOSE 89 136*  CALCIUM 8.4* 9.1    Recent Labs  05/28/16 1105  INR 1.06    Neurologically intact ABD soft Neurovascular intact Sensation intact distally Intact pulses distally Dorsiflexion/Plantar flexion intact Incision: dressing C/D/I and mild dried blood drainage proximal aspect of dressing on 4x4 under vac dressing No cellulitis present Compartment soft no sign of DVT  Assessment/Plan: 1 Day Post-Op Procedure(s) (LRB): RADICAL SYNOVECTOMY,IRRIGATION AND DEBRIDEMENT KNEE WITH POLY EXCHANGE WITH ANTIBIOTIC BEADS, APPLICATION OF WOUND VAC (Right) Advance diet Up with therapy D/C IV fluids  Initial culture from 1/3 positive for enterococcus faecalis Placed on vanco per pharmacy Awaiting final sensitivities to determine abx for home May require stay through the weekend while awaiting final C&S Continuous wound vac, set up for wound vac at home with home  health Resume Eliquis 2.5mg  BID today and increase to 5mg  BID tomorrow (orders placed) Discussed with Dr. Tonita Cong- ID consult placed, Dr. Tonita Cong discussed with Dr. Megan Salon who will be in to eval pt and help determine abx both for post-op and chronic suppression.  [name] M. 05/31/2016, 8:42 AM

## 2016-06-01 LAB — CBC
HEMATOCRIT: 27.5 % — AB (ref 36.0–46.0)
HEMOGLOBIN: 9 g/dL — AB (ref 12.0–15.0)
MCH: 30.1 pg (ref 26.0–34.0)
MCHC: 32.7 g/dL (ref 30.0–36.0)
MCV: 92 fL (ref 78.0–100.0)
Platelets: 165 10*3/uL (ref 150–400)
RBC: 2.99 MIL/uL — AB (ref 3.87–5.11)
RDW: 16.5 % — ABNORMAL HIGH (ref 11.5–15.5)
WBC: 6.7 10*3/uL (ref 4.0–10.5)

## 2016-06-01 NOTE — Progress Notes (Signed)
   Subjective: 2 Days Post-Op Procedure(s) (LRB): RADICAL SYNOVECTOMY,IRRIGATION AND DEBRIDEMENT KNEE WITH POLY EXCHANGE WITH ANTIBIOTIC BEADS, APPLICATION OF WOUND VAC (Right)  Appreciate Dr. Megan Salon following Currently with wound vac and IV antibiotics  Plan to d/c home with wound vac, home health PT/OT and possible oral antibiotics vs infusions depending on ID recommendations Patient reports pain as mild.  Objective:   VITALS:   Vitals:   05/31/16 2300 06/01/16 0500  BP: (!) 111/48 113/60  Pulse: 97 89  Resp: 16 16  Temp: 98.9 F (37.2 C) 99.3 F (37.4 C)    Right knee dressing and wound vac in place nv intact distally No rashes or edema distally  LABS  Recent Labs  05/31/16 0340 06/01/16 0453  HGB 9.4* 9.0*  HCT 28.9* 27.5*  WBC 5.2 6.7  PLT 186 165     Recent Labs  05/30/16 0517 05/31/16 0340  NA 138 135  K 4.8 3.7  BUN 16 11  CREATININE 0.53 0.45  GLUCOSE 89 136*     Assessment/Plan: 2 Days Post-Op Procedure(s) (LRB): RADICAL SYNOVECTOMY,IRRIGATION AND DEBRIDEMENT KNEE WITH POLY EXCHANGE WITH ANTIBIOTIC BEADS, APPLICATION OF WOUND VAC (Right) Continue IV antibiotics  Possible d/c tomorrow depending on cultures and ID recommendations May complete some light walking with knee immobilizer in place Pain under control currently    [place], MPAS, PA-C  06/01/2016, 7:59 AM

## 2016-06-01 NOTE — Progress Notes (Signed)
Pharmacy Antibiotic Note  [name] is a 61 y.o. female admitted on 05/28/2016 for repeat I&D, wound vac placement, and poly exchange of knee wound s/p TKA 03/20/16.  S/p I&D procedures on 1/3 and on 1/5.  Home medications include cipro, and continued inpatient through 05/29/16.  Pharmacy has been consulted for Vancomycin dosing for wound infection.  Plan:  Vancomycin 1g IV given preop 1/5  D3 Vancomycin 1g IV q12h  Joint fluid Cx: enterococcus, sens to Amp, Vanc; noted PCN allergy - rash  Per ID recs: continue Vancomcyin via PAC x 6 wk  Trough 1/8 prior to 5th dose in anticipation of discharge  Height: 5\' 7"  (170.2 cm) Weight: 155 lb (70.3 kg) IBW/kg (Calculated) : 61.6  Temp (24hrs), Avg:99 F (37.2 C), Min:98.9 F (37.2 C), Max:99.3 F (37.4 C)   Recent Labs Lab 05/29/16 0443 05/30/16 0517 05/31/16 0340 06/01/16 0453  WBC 2.6*  --  5.2 6.7  CREATININE 0.50 0.53 0.45  --     Estimated Creatinine Clearance: 72.7 mL/min (by C-G formula based on SCr of 0.45 mg/dL).    Allergies  Allergen Reactions  . Tape Hives    Can tolerate paper tape  . Other Rash    STERI STRIPS - Blisters  . Penicillins Rash    Denies airway involvement Has patient had a PCN reaction causing immediate rash, facial/tongue/throat swelling, SOB or lightheadedness with hypotension: No Has patient had a PCN reaction causing severe rash involving mucus membranes or skin necrosis: No Has patient had a PCN reaction that required hospitalization No Has patient had a PCN reaction occurring within the last 10 years: No If all of the above answers are "NO", then may proceed with Cephalosporin use.      Antimicrobials this admission: 1/5 Vancomycin >>  Dose adjustments this admission: 1/8 trough at 0100 = ____  Microbiology results: 12/16 BCx: NGF 12/16 UCx: <10k colonies, insignificant 1/3 MRSA PCR: neg 1/3 Joint fluid culture:  RARE ENTEROCOCCUS FAECALIS  Thank you for allowing pharmacy to  be a part of this patient's care.  Minda Ditto PharmD Pager [phone] 06/01/2016, 11:20 AM

## 2016-06-01 NOTE — Care Management Note (Signed)
Case Management Note  Patient Details  Name: MAZIE LASSETER MRN: [id number] Date of Birth: 03/06/1956  Subjective/Objective:    Radical Synovectomy, I&D Knee with Poly Exchange with ABX               Action/Plan: Discharge Planning: NCM spoke to pt and plan is dc on tomorrow with IV abx. Pt Boyd arranged with [facility]. Contacted [facility] Liaison with update on dc with IV abx. Will need paper RX for agency at time of dc. Pt has [facility] home wound vac in room.   PCP  Midge Minium   Expected Discharge Date:               Expected Discharge Plan:  [place]  In-House Referral:  NA  Discharge planning Services  CM Consult  Post Acute Care Choice:  Home Health Choice offered to:  Patient  DME Arranged:  Vac DME Agency:  [facility]  HH Arranged:  RN, IV Antibiotics HH Agency:  [facility]  Status of Service:  Completed, signed off  If discussed at [place] of Stay Meetings, dates discussed:    Additional Comments:  Erenest Rasher, RN 06/01/2016, 10:33 AM

## 2016-06-02 ENCOUNTER — Encounter (HOSPITAL_COMMUNITY): Payer: Self-pay | Admitting: Specialist

## 2016-06-02 DIAGNOSIS — C773 Secondary and unspecified malignant neoplasm of axilla and upper limb lymph nodes: Secondary | ICD-10-CM | POA: Diagnosis present

## 2016-06-02 DIAGNOSIS — Z5181 Encounter for therapeutic drug level monitoring: Secondary | ICD-10-CM | POA: Diagnosis not present

## 2016-06-02 DIAGNOSIS — I10 Essential (primary) hypertension: Secondary | ICD-10-CM | POA: Diagnosis not present

## 2016-06-02 DIAGNOSIS — Z880 Allergy status to penicillin: Secondary | ICD-10-CM | POA: Diagnosis not present

## 2016-06-02 DIAGNOSIS — A4902 Methicillin resistant Staphylococcus aureus infection, unspecified site: Secondary | ICD-10-CM | POA: Diagnosis not present

## 2016-06-02 DIAGNOSIS — M009 Pyogenic arthritis, unspecified: Secondary | ICD-10-CM | POA: Diagnosis present

## 2016-06-02 DIAGNOSIS — D649 Anemia, unspecified: Secondary | ICD-10-CM | POA: Diagnosis present

## 2016-06-02 DIAGNOSIS — Z888 Allergy status to other drugs, medicaments and biological substances status: Secondary | ICD-10-CM | POA: Diagnosis not present

## 2016-06-02 DIAGNOSIS — N390 Urinary tract infection, site not specified: Secondary | ICD-10-CM | POA: Diagnosis present

## 2016-06-02 DIAGNOSIS — Z853 Personal history of malignant neoplasm of breast: Secondary | ICD-10-CM | POA: Diagnosis not present

## 2016-06-02 DIAGNOSIS — Z96651 Presence of right artificial knee joint: Secondary | ICD-10-CM | POA: Diagnosis present

## 2016-06-02 DIAGNOSIS — Z809 Family history of malignant neoplasm, unspecified: Secondary | ICD-10-CM | POA: Diagnosis not present

## 2016-06-02 DIAGNOSIS — T8453XA Infection and inflammatory reaction due to internal right knee prosthesis, initial encounter: Secondary | ICD-10-CM | POA: Diagnosis present

## 2016-06-02 DIAGNOSIS — Z85828 Personal history of other malignant neoplasm of skin: Secondary | ICD-10-CM | POA: Diagnosis not present

## 2016-06-02 DIAGNOSIS — Z7901 Long term (current) use of anticoagulants: Secondary | ICD-10-CM | POA: Diagnosis not present

## 2016-06-02 DIAGNOSIS — Z452 Encounter for adjustment and management of vascular access device: Secondary | ICD-10-CM | POA: Diagnosis not present

## 2016-06-02 DIAGNOSIS — B952 Enterococcus as the cause of diseases classified elsewhere: Secondary | ICD-10-CM | POA: Diagnosis present

## 2016-06-02 DIAGNOSIS — Z9221 Personal history of antineoplastic chemotherapy: Secondary | ICD-10-CM | POA: Diagnosis not present

## 2016-06-02 DIAGNOSIS — Z91048 Other nonmedicinal substance allergy status: Secondary | ICD-10-CM | POA: Diagnosis not present

## 2016-06-02 DIAGNOSIS — T8131XA Disruption of external operation (surgical) wound, not elsewhere classified, initial encounter: Secondary | ICD-10-CM | POA: Diagnosis not present

## 2016-06-02 DIAGNOSIS — W109XXD Fall (on) (from) unspecified stairs and steps, subsequent encounter: Secondary | ICD-10-CM | POA: Diagnosis present

## 2016-06-02 DIAGNOSIS — Z96659 Presence of unspecified artificial knee joint: Secondary | ICD-10-CM | POA: Diagnosis not present

## 2016-06-02 DIAGNOSIS — C7989 Secondary malignant neoplasm of other specified sites: Secondary | ICD-10-CM | POA: Diagnosis present

## 2016-06-02 DIAGNOSIS — R21 Rash and other nonspecific skin eruption: Secondary | ICD-10-CM | POA: Diagnosis present

## 2016-06-02 DIAGNOSIS — Z9013 Acquired absence of bilateral breasts and nipples: Secondary | ICD-10-CM | POA: Diagnosis not present

## 2016-06-02 LAB — PREPARE RBC (CROSSMATCH)

## 2016-06-02 LAB — CBC WITH DIFFERENTIAL/PLATELET
Basophils Absolute: 0 10*3/uL (ref 0.0–0.1)
Basophils Relative: 0 %
EOS ABS: 0.3 10*3/uL (ref 0.0–0.7)
EOS PCT: 4 %
HEMATOCRIT: 24.3 % — AB (ref 36.0–46.0)
Hemoglobin: 7.8 g/dL — ABNORMAL LOW (ref 12.0–15.0)
Lymphocytes Relative: 22 %
Lymphs Abs: 1.3 10*3/uL (ref 0.7–4.0)
MCH: 29.1 pg (ref 26.0–34.0)
MCHC: 32.1 g/dL (ref 30.0–36.0)
MCV: 90.7 fL (ref 78.0–100.0)
MONO ABS: 0.8 10*3/uL (ref 0.1–1.0)
MONOS PCT: 12 %
Neutro Abs: 3.7 10*3/uL (ref 1.7–7.7)
Neutrophils Relative %: 62 %
PLATELETS: 162 10*3/uL (ref 150–400)
RBC: 2.68 MIL/uL — ABNORMAL LOW (ref 3.87–5.11)
RDW: 16.9 % — AB (ref 11.5–15.5)
WBC: 6.1 10*3/uL (ref 4.0–10.5)

## 2016-06-02 LAB — BASIC METABOLIC PANEL
ANION GAP: 8 (ref 5–15)
BUN: 8 mg/dL (ref 6–20)
CALCIUM: 8.6 mg/dL — AB (ref 8.9–10.3)
CO2: 30 mmol/L (ref 22–32)
CREATININE: 0.47 mg/dL (ref 0.44–1.00)
Chloride: 97 mmol/L — ABNORMAL LOW (ref 101–111)
GLUCOSE: 99 mg/dL (ref 65–99)
Potassium: 3.5 mmol/L (ref 3.5–5.1)
Sodium: 135 mmol/L (ref 135–145)

## 2016-06-02 LAB — ANAEROBIC CULTURE

## 2016-06-02 LAB — VANCOMYCIN, TROUGH: VANCOMYCIN TR: 13 ug/mL — AB (ref 15–20)

## 2016-06-02 LAB — SEDIMENTATION RATE: SED RATE: 74 mm/h — AB (ref 0–22)

## 2016-06-02 LAB — C-REACTIVE PROTEIN: CRP: 2 mg/dL — ABNORMAL HIGH (ref <1.0)

## 2016-06-02 MED ORDER — VANCOMYCIN HCL 10 G IV SOLR: 1250.0000 mg | Freq: Two times a day (BID) | INTRAVENOUS | 0 refills | Status: AC

## 2016-06-02 MED ORDER — SODIUM CHLORIDE 0.9 % IV SOLN
Freq: Once | INTRAVENOUS | Status: AC
  Administered 2016-06-02: 13:00:00 via INTRAVENOUS

## 2016-06-02 MED ORDER — VANCOMYCIN HCL 10 G IV SOLR
1250.0000 mg | Freq: Two times a day (BID) | INTRAVENOUS | Status: DC
  Administered 2016-06-02 – 2016-06-03 (×3): 1250 mg via INTRAVENOUS
  Filled 2016-06-02 (×4): qty 1250

## 2016-06-02 NOTE — Op Note (Signed)
NAME:  [name], [name] NO.:  MEDICAL RECORD NO.:  [id number]  LOCATION:                                 FACILITY:  PHYSICIAN:  Susa Day, M.D.    DATE OF BIRTH:  [birth year]  DATE OF PROCEDURE:  05/30/2016 DATE OF DISCHARGE:                              OPERATIVE REPORT   PREOPERATIVE DIAGNOSIS:  Septic arthritis, right total knee.  POSTOPERATIVE DIAGNOSIS:  Septic arthritis, right total knee.  PROCEDURES PERFORMED: 1. Open arthrotomy of the right knee with extensive total synovectomy. 2. Irrigation and debridement, pulsatile lavage. 3. Polyethylene insert exchange. 4. Application of wound VAC. 5. Application of vancomycin pellets.  ANESTHESIA:  General.  ASSISTANT:  Cleophas Dunker, PA.  HISTORY:  This is a 61 year old female who was admitted with sepsis with urinary tract infection.  She developed knee pain and nonhealing wound. She was taken previously to the OR, where she had a wound debridement, that was not healing.  There was no obvious connection to the joint and the stat Gram stain was negative for organisms.  Wound VAC was applied. She was admitted to the hospital.  Subsequent cell count and culture had indicated a 10,000 white cells and over 90% neutrophils of course felt to be infected.  We discussed return to the operating room, open arthrotomy, poly exchange, extensive debridement.  Risks and benefits discussed including bleeding, infection, damage to neurovascular structures, no change in symptoms, worsening symptoms, need for revision, DVT, PE, anesthetic complications, need for resection, arthroplasty, etc.  Just prior to that, the cultures that were taken from the previous procedure was called and it was an isolated wound. Organisms that indicate faecalis.  I called the pharmacy.  It was enterococcus faecalis.  The susceptibilities were pending.  I therefore asked the pharmacy to explore the antibiogram from the previous  year and they felt that the vancomycin that she was on would be appropriate.  I then proceeded with the procedure.  TECHNIQUE:  With the patient in supine position, after induction of adequate general anesthesia and a gram of vancomycin, the right lower extremity was prepped and draped in usual sterile fashion.  Previous VAC was removed.  The wound actually looked like good granulating tissue.  I removed the previous sutures, opened the previous skin incision cephalad to caudad.  Self-retaining retractors were placed using electrocautery. She had fair amount of generalized bleeding, I felt that tourniquet would be best.  I then elevated leg, put tourniquet up to 75.  To reduce her bleeding she had been on Eliquis and Lovenox last night.  I performed a patellar arthrotomy.  Removed previous PDS sutures. Serosanguineous fluid was evacuated from the joint.  I everted the patella, flexed the knee.  Hypertrophic synovium was noted, we meticulously performed extensive synovectomy in the gutters, caudad, cephalad.  Debrided all tissues and curetted the tissues.  The implants were not loose.  Following this, we used pulsatile lavage 6 L and then flexed the knee, used an osteotome and removed the 5 insert.  We then used pulsatile lavage in the back of the knee.  We did not debride the back  of the knee.  We wiped the back of the knee with a sponge. Subluxed it forward with Crego.  Copiously irrigated with pulsatile lavage and then antibiotic irrigation.  We used a total of 12 L.  After antibiotic irrigation I selected a 5 insert, reduced it and we had full extension and excellent patellofemoral tracking.  Negative anterior drawer.  Good stability, varus and valgus stressing 0-30 degrees. Following this, we used vancomycin beads.  The Stimulan beads were mixed with vancomycin.  Then following that __________ wound and then repaired the patellar arthrotomy with 1 Vicryl interrupted  figure-of-eight sutures and then a running STRATAFIX, flexion to 90.  I then __________ was reapproximated with proximal 2-0 Vicryl.  Closure without tension just to the infrapatellar portion of the incision.  A close vertical mattress sutures from the proximal incision distal.  It was very small. There was an area of about 5 cm x 3 cm.  We were unable to a close.  We placed a VAC over top of the soft tissue.  We closed tissue to cover the tendon.  We cut out a sponge that covered the tendon and then put Adaptic on her wound and then covered the entirety with the wound VAC dressing, pierced the covering, placed the suction to the suction unit, engaged it via good application of the appropriate vacuum.  I then placed the Ace bandage on the leg.  Extubated without difficulty and transported to the recovery room in satisfactory condition.  The patient tolerated the procedure well.  No complications.  Assistant, Cleophas Dunker, [place].  Blood loss was 100 mL.     Susa Day, M.D.     Geralynn Rile  D:  05/30/2016  T:  05/31/2016  Job:  [id number]

## 2016-06-02 NOTE — Progress Notes (Addendum)
Subjective: 3 Days Post-Op Procedure(s) (LRB): RADICAL SYNOVECTOMY,IRRIGATION AND DEBRIDEMENT KNEE WITH POLY EXCHANGE WITH ANTIBIOTIC BEADS, APPLICATION OF WOUND VAC (Right) Patient reports pain as mild.  Reports pain well controlled. Tolerating vanco well. No c/o.  Objective: Vital signs in last 24 hours: Temp:  [98.2 F (36.8 C)-100.4 F (38 C)] 100.4 F (38 C) (01/08 0521) Pulse Rate:  [103-109] 107 (01/08 0521) Resp:  [15-16] 16 (01/08 0521) BP: (103-119)/(44-53) 119/53 (01/08 0521) SpO2:  [94 %-97 %] 94 % (01/08 0521)  Intake/Output from previous day: 01/07 0701 - 01/08 0700 In: 1320 [P.O.:1320] Out: 2300 [Urine:2300] Intake/Output this shift: Total I/O In: 360 [P.O.:360] Out: 400 [Urine:400]   Recent Labs  05/31/16 0340 06/01/16 0453  HGB 9.4* 9.0*    Recent Labs  05/31/16 0340 06/01/16 0453  WBC 5.2 6.7  RBC 3.15* 2.99*  HCT 28.9* 27.5*  PLT 186 165    Recent Labs  05/31/16 0340  NA 135  K 3.7  CL 95*  CO2 33*  BUN 11  CREATININE 0.45  GLUCOSE 136*  CALCIUM 9.1   No results for input(s): LABPT, INR in the last 72 hours.  Neurologically intact ABD soft Neurovascular intact Sensation intact distally Intact pulses distally Dorsiflexion/Plantar flexion intact Incision: dressing C/D/I and moderate drainage No cellulitis present Compartment soft dried bloody drainage on proximal aspect of dressing (gauze) under vac dressing  Assessment/Plan: 3 Days Post-Op Procedure(s) (LRB): RADICAL SYNOVECTOMY,IRRIGATION AND DEBRIDEMENT KNEE WITH POLY EXCHANGE WITH ANTIBIOTIC BEADS, APPLICATION OF WOUND VAC (Right) Advance diet Up with therapy D/C IV fluids  Appreciate ID input- Dr. Megan Salon has recommended IV Vanco x 6 weeks and has mentioned several options for chronic suppressive therapy including desensitization to PCN. Plans to see her outpt in office in 1 month. Details below: Discharge antibiotics: Per pharmacy protocol Vancomycin Aim for  Vancomycin trough 15-20 (unless otherwise indicated) Duration: 6 weeks End Date: 07/11/2016  [facility] Care Per Protocol: She has a Port-A-Cath  Labs weekly while on IV antibiotics: _x_ CBC with differential _x_ BMP __ CMP _x_ CRP _x_ ESR _x_ Vancomycin trough  _NA_ Please pull PIC at completion of IV antibiotics _NA_ Please leave PIC in place until doctor has seen patient or been notified  Fax weekly labs to [phone]  Anticipate D/C home today with home health for assistance with wound vac and IV abx. Abx script has been generated. Lab orders placed with directions to fax to Dr. Megan Salon weekly. Will have wound care change her vac and dressing today prior to D/C Discussed with Dr. Mliss Fritz, Conley Rolls. 06/02/2016, 8:26 AM

## 2016-06-02 NOTE — Progress Notes (Signed)
Rx Brief note:  Vancomycin  See 1/7 note by T. Jeliyah Middlebrooks for details  Assessement: 0135 VT=13 mg/L on 1Gm IV q12h at Css Below goal of 15-20 mg/L (per ID note) Scr stable  Plan: Vancomycin 1250 mg IV q12h   F/u Scr/additional levels as needed  Thanks Dorrene German 06/02/2016 2:39 AM

## 2016-06-02 NOTE — Discharge Summary (Signed)
Physician Discharge Summary   Patient ID: [name] MRN: [id number] DOB/AGE: [birth year] 61 y.o.  Admit date: 05/28/2016 Discharge date: 06/03/2016  Primary Diagnosis: R knee non-healing wound following dehiscence Admission Diagnoses:  Past Medical History:  Diagnosis Date  . 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 Arthritis KNEES   right knee, hx. past left knee replacemnt.  . Blood clot in vein    around Portacath right chest-tx. Eliquis about a yr., prior Lovenox.  . Blood dyscrasia   . Chronic anticoagulation HX  PORT-A-CATH CLOT   2010 - HAS BEEN ON LOVENOX SINCE THE CLOT  . Elevated blood pressure reading without diagnosis of hypertension    PT MONITORS HER B/P AT HOME  . Heart murmur   . 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 PONV (postoperative nausea and vomiting)   . 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 Status post skin flap graft    right chest wall with metastatis right anterior chest -no drainage or open wound.   Discharge Diagnoses:   Principal Problem:   Infection of prosthetic right knee joint ([place]) Active Problems:   Right knee DJD   Wound dehiscence, surgical   Nonhealing surgical wound   S/P total knee arthroplasty, right  Estimated body mass index is 24.28 kg/m as calculated from the following:   Height as of this encounter: _0  (1.702 m).   Weight as of this encounter: 70.3 kg (155 lb).  Procedure:  Procedure(s) (LRB): RADICAL SYNOVECTOMY,IRRIGATION AND DEBRIDEMENT KNEE WITH POLY EXCHANGE WITH ANTIBIOTIC BEADS, APPLICATION OF WOUND VAC (Right)   Consults: infectious disease  HPI: see H&P Laboratory Data: Admission on 05/28/2016  Component Date Value Ref Range Status  . MRSA, PCR 05/28/2016 NEGATIVE  NEGATIVE Final  .  Staphylococcus aureus 05/28/2016 NEGATIVE  NEGATIVE Final   Comment:        The Xpert SA Assay (FDA approved for NASAL specimens in patients over 30 years of age), is one component of a comprehensive surveillance program.  Test performance has been validated by [facility] for patients greater than or equal to 5 year old. It is not intended to diagnose infection nor to guide or monitor treatment.   [name] aPTT 05/28/2016 38* 24 - 36 seconds Final   Comment:        IF BASELINE aPTT IS ELEVATED, SUGGEST PATIENT RISK ASSESSMENT BE USED TO DETERMINE APPROPRIATE ANTICOAGULANT THERAPY.   . Prothrombin Time 05/28/2016 13.8  11.4 - 15.2 seconds Final  . INR 05/28/2016 1.06   Final  . ABO/RH(D) 05/28/2016 A POS   Final  . Antibody Screen 05/28/2016 NEG   Final  . Sample Expiration 05/28/2016 05/31/2016   Final  . Specimen Description 06/01/2016 JOINT FLUID   Final  . Special Requests 06/01/2016 NONE   Final  . Culture 06/01/2016 NO ANAEROBES ISOLATED; CULTURE IN PROGRESS FOR 5 DAYS   Final  . Report Status 06/01/2016 PENDING   Incomplete  . Color, Synovial 05/28/2016 RED* YELLOW Final  . Appearance-Synovial 05/28/2016 TURBID* CLEAR Final  . Crystals, Fluid 05/28/2016 NO CRYSTALS SEEN   Final  . WBC, Synovial 05/28/2016 13650* 0 - 200 /cu mm Final  . Neutrophil, Synovial 05/28/2016 91* 0 - 25 % Final  .  Lymphocytes-Synovial Fld 05/28/2016 3  0 - 20 % Final  . Monocyte-Macrophage-Synovial Fluid 05/28/2016 6* 50 - 90 % Final  . Specimen Description 05/31/2016 JOINT FLUID   Final  . Special Requests 05/31/2016 NONE   Final  . Gram Stain 05/31/2016    Final                   Value:FEW WBC PRESENT, PREDOMINANTLY PMN NO ORGANISMS SEEN Gram Stain Report Called to,Read Back By and Verified With: HOLLY COBLE AT 3335 05/28/16 BY THOMPSON,N.   . Culture 05/31/2016    Final                   Value:RARE ENTEROCOCCUS FAECALIS CRITICAL RESULT CALLED TO, READ BACK BY AND VERIFIED WITH: DR Tonita Cong AT 1503  05/30/16 BY L BENFIELD Performed at [facility]   . Report Status 05/31/2016 05/31/2016 FINAL   Final  . Organism ID, Bacteria 05/31/2016 ENTEROCOCCUS FAECALIS   Final  . Sodium 05/29/2016 135  135 - 145 mmol/L Final  . Potassium 05/29/2016 3.6  3.5 - 5.1 mmol/L Final  . Chloride 05/29/2016 99* 101 - 111 mmol/L Final  . CO2 05/29/2016 27  22 - 32 mmol/L Final  . Glucose, Bld 05/29/2016 113* 65 - 99 mg/dL Final  . BUN 05/29/2016 16  6 - 20 mg/dL Final  . Creatinine, Ser 05/29/2016 0.50  0.44 - 1.00 mg/dL Final  . Calcium 05/29/2016 8.7* 8.9 - 10.3 mg/dL Final  . Total Protein 05/29/2016 6.8  6.5 - 8.1 g/dL Final  . Albumin 05/29/2016 2.8* 3.5 - 5.0 g/dL Final  . AST 05/29/2016 59* 15 - 41 U/L Final  . ALT 05/29/2016 28  14 - 54 U/L Final  . Alkaline Phosphatase 05/29/2016 111  38 - 126 U/L Final  . Total Bilirubin 05/29/2016 0.6  0.3 - 1.2 mg/dL Final  . GFR calc non Af Amer 05/29/2016 >60  >60 mL/min Final  . GFR calc Af Amer 05/29/2016 >60  >60 mL/min Final   Comment: (NOTE) The eGFR has been calculated using the CKD EPI equation. This calculation has not been validated in all clinical situations. eGFR's persistently <60 mL/min signify possible Chronic Kidney Disease.   . Anion gap 05/29/2016 9  5 - 15 Final  . WBC 05/29/2016 2.6* 4.0 - 10.5 K/uL Final  . RBC 05/29/2016 3.49* 3.87 - 5.11 MIL/uL Final  . Hemoglobin 05/29/2016 10.4* 12.0 - 15.0 g/dL Final  . HCT 05/29/2016 32.0* 36.0 - 46.0 % Final  . MCV 05/29/2016 91.7  78.0 - 100.0 fL Final  . MCH 05/29/2016 29.8  26.0 - 34.0 pg Final  . MCHC 05/29/2016 32.5  30.0 - 36.0 g/dL Final  . RDW 05/29/2016 16.2* 11.5 - 15.5 % Final  . Platelets 05/29/2016 198  150 - 400 K/uL Final  . Neutrophils Relative % 05/29/2016 72  % Final  . Neutro Abs 05/29/2016 1.9  1.7 - 7.7 K/uL Final  . Lymphocytes Relative 05/29/2016 21  % Final  . Lymphs Abs 05/29/2016 0.6* 0.7 - 4.0 K/uL Final  . Monocytes Relative 05/29/2016 7  % Final  .  Monocytes Absolute 05/29/2016 0.2  0.1 - 1.0 K/uL Final  . Eosinophils Relative 05/29/2016 0  % Final  . Eosinophils Absolute 05/29/2016 0.0  0.0 - 0.7 K/uL Final  . Basophils Relative 05/29/2016 0  % Final  . Basophils Absolute 05/29/2016 0.0  0.0 - 0.1 K/uL Final  . Sodium 05/30/2016 138  135 -  145 mmol/L Final  . Potassium 05/30/2016 4.8  3.5 - 5.1 mmol/L Final   Comment: DELTA CHECK NOTED REPEATED TO VERIFY MODERATE HEMOLYSIS   . Chloride 05/30/2016 99* 101 - 111 mmol/L Final  . CO2 05/30/2016 32  22 - 32 mmol/L Final  . Glucose, Bld 05/30/2016 89  65 - 99 mg/dL Final  . BUN 05/30/2016 16  6 - 20 mg/dL Final  . Creatinine, Ser 05/30/2016 0.53  0.44 - 1.00 mg/dL Final  . Calcium 05/30/2016 8.4* 8.9 - 10.3 mg/dL Final  . GFR calc non Af Amer 05/30/2016 >60  >60 mL/min Final  . GFR calc Af Amer 05/30/2016 >60  >60 mL/min Final   Comment: (NOTE) The eGFR has been calculated using the CKD EPI equation. This calculation has not been validated in all clinical situations. eGFR's persistently <60 mL/min signify possible Chronic Kidney Disease.   . Anion gap 05/30/2016 7  5 - 15 Final  . Sodium 05/31/2016 135  135 - 145 mmol/L Final  . Potassium 05/31/2016 3.7  3.5 - 5.1 mmol/L Final  . Chloride 05/31/2016 95* 101 - 111 mmol/L Final  . CO2 05/31/2016 33* 22 - 32 mmol/L Final  . Glucose, Bld 05/31/2016 136* 65 - 99 mg/dL Final  . BUN 05/31/2016 11  6 - 20 mg/dL Final  . Creatinine, Ser 05/31/2016 0.45  0.44 - 1.00 mg/dL Final  . Calcium 05/31/2016 9.1  8.9 - 10.3 mg/dL Final  . GFR calc non Af Amer 05/31/2016 >60  >60 mL/min Final  . GFR calc Af Amer 05/31/2016 >60  >60 mL/min Final   Comment: (NOTE) The eGFR has been calculated using the CKD EPI equation. This calculation has not been validated in all clinical situations. eGFR's persistently <60 mL/min signify possible Chronic Kidney Disease.   . Anion gap 05/31/2016 7  5 - 15 Final  . WBC 05/31/2016 5.2  4.0 - 10.5 K/uL Final    . RBC 05/31/2016 3.15* 3.87 - 5.11 MIL/uL Final  . Hemoglobin 05/31/2016 9.4* 12.0 - 15.0 g/dL Final  . HCT 05/31/2016 28.9* 36.0 - 46.0 % Final  . MCV 05/31/2016 91.7  78.0 - 100.0 fL Final  . MCH 05/31/2016 29.8  26.0 - 34.0 pg Final  . MCHC 05/31/2016 32.5  30.0 - 36.0 g/dL Final  . RDW 05/31/2016 16.3* 11.5 - 15.5 % Final  . Platelets 05/31/2016 186  150 - 400 K/uL Final  . WBC 06/01/2016 6.7  4.0 - 10.5 K/uL Final  . RBC 06/01/2016 2.99* 3.87 - 5.11 MIL/uL Final  . Hemoglobin 06/01/2016 9.0* 12.0 - 15.0 g/dL Final  . HCT 06/01/2016 27.5* 36.0 - 46.0 % Final  . MCV 06/01/2016 92.0  78.0 - 100.0 fL Final  . MCH 06/01/2016 30.1  26.0 - 34.0 pg Final  . MCHC 06/01/2016 32.7  30.0 - 36.0 g/dL Final  . RDW 06/01/2016 16.5* 11.5 - 15.5 % Final  . Platelets 06/01/2016 165  150 - 400 K/uL Final  . Vancomycin Tr 06/02/2016 13* 15 - 20 ug/mL Final  Admission on 05/10/2016, Discharged on 05/12/2016  Component Date Value Ref Range Status  . Sodium 05/10/2016 132* 135 - 145 mmol/L Final  . Potassium 05/10/2016 3.9  3.5 - 5.1 mmol/L Final  . Chloride 05/10/2016 98* 101 - 111 mmol/L Final  . CO2 05/10/2016 23  22 - 32 mmol/L Final  . Glucose, Bld 05/10/2016 99  65 - 99 mg/dL Final  . BUN 05/10/2016 20  6 - 20 mg/dL  Final  . Creatinine, Ser 05/10/2016 1.00  0.44 - 1.00 mg/dL Final  . Calcium 05/10/2016 8.7* 8.9 - 10.3 mg/dL Final  . Total Protein 05/10/2016 7.4  6.5 - 8.1 g/dL Final  . Albumin 05/10/2016 3.7  3.5 - 5.0 g/dL Final  . AST 05/10/2016 101* 15 - 41 U/L Final  . ALT 05/10/2016 57* 14 - 54 U/L Final  . Alkaline Phosphatase 05/10/2016 138* 38 - 126 U/L Final  . Total Bilirubin 05/10/2016 1.7* 0.3 - 1.2 mg/dL Final  . GFR calc non Af Amer 05/10/2016 >60  >60 mL/min Final  . GFR calc Af Amer 05/10/2016 >60  >60 mL/min Final   Comment: (NOTE) The eGFR has been calculated using the CKD EPI equation. This calculation has not been validated in all clinical situations. eGFR's  persistently <60 mL/min signify possible Chronic Kidney Disease.   . Anion gap 05/10/2016 11  5 - 15 Final  . I-stat hCG, quantitative 05/10/2016 <5.0  <5 mIU/mL Final  . Comment 3 05/10/2016          Final   Comment:   GEST. AGE      CONC.  (mIU/mL)   <=1 WEEK        5 - 50     2 WEEKS       50 - 500     3 WEEKS       100 - 10,000     4 WEEKS     1,000 - 30,000        FEMALE AND NON-PREGNANT FEMALE:     LESS THAN 5 mIU/mL   . Lactic Acid, Venous 05/10/2016 1.53  0.5 - 1.9 mmol/L Final  . WBC 05/10/2016 14.7* 4.0 - 10.5 K/uL Final  . RBC 05/10/2016 3.90  3.87 - 5.11 MIL/uL Final  . Hemoglobin 05/10/2016 11.8* 12.0 - 15.0 g/dL Final  . HCT 05/10/2016 35.7* 36.0 - 46.0 % Final  . MCV 05/10/2016 91.5  78.0 - 100.0 fL Final  . MCH 05/10/2016 30.3  26.0 - 34.0 pg Final  . MCHC 05/10/2016 33.1  30.0 - 36.0 g/dL Final  . RDW 05/10/2016 16.5* 11.5 - 15.5 % Final  . Platelets 05/10/2016 114* 150 - 400 K/uL Final   Comment: REPEATED TO VERIFY SPECIMEN CHECKED FOR CLOTS PLATELET COUNT CONFIRMED BY SMEAR   . Neutrophils Relative % 05/10/2016 89  % Final  . Lymphocytes Relative 05/10/2016 4  % Final  . Monocytes Relative 05/10/2016 7  % Final  . Eosinophils Relative 05/10/2016 0  % Final  . Basophils Relative 05/10/2016 0  % Final  . Neutro Abs 05/10/2016 13.1* 1.7 - 7.7 K/uL Final  . Lymphs Abs 05/10/2016 0.6* 0.7 - 4.0 K/uL Final  . Monocytes Absolute 05/10/2016 1.0  0.1 - 1.0 K/uL Final  . Eosinophils Absolute 05/10/2016 0.0  0.0 - 0.7 K/uL Final  . Basophils Absolute 05/10/2016 0.0  0.0 - 0.1 K/uL Final  . Smear Review 05/10/2016 PLATELET COUNT CONFIRMED BY SMEAR   Final  . Prothrombin Time 05/10/2016 16.5* 11.4 - 15.2 seconds Final  . INR 05/10/2016 1.32   Final  . Specimen Description 05/16/2016 BLOOD RIGHT ANTECUBITAL   Final  . Special Requests 05/16/2016 BOTTLES DRAWN AEROBIC AND ANAEROBIC 5CC   Final  . Culture 05/16/2016    Final                   Value:NO GROWTH 5  DAYS Performed at [facility]   .  Report Status 05/16/2016 05/16/2016 FINAL   Final  . Specimen Description 05/16/2016 BLOOD RIGHT WRIST   Final  . Special Requests 05/16/2016 BOTTLES DRAWN AEROBIC AND ANAEROBIC 10CC   Final  . Culture 05/16/2016    Final                   Value:NO GROWTH 5 DAYS Performed at [facility]   . Report Status 05/16/2016 05/16/2016 FINAL   Final  . Specimen Description 05/12/2016 URINE, CLEAN CATCH   Final  . Special Requests 05/12/2016 NONE   Final  . Culture 05/12/2016 *  Final                   Value:<10,000 COLONIES/mL INSIGNIFICANT GROWTH Performed at [facility]   . Report Status 05/12/2016 05/12/2016 FINAL   Final  . Color, Urine 05/10/2016 YELLOW  YELLOW Final  . APPearance 05/10/2016 HAZY* CLEAR Final  . Specific Gravity, Urine 05/10/2016 1.008  1.005 - 1.030 Final  . pH 05/10/2016 5.0  5.0 - 8.0 Final  . Glucose, UA 05/10/2016 NEGATIVE  NEGATIVE mg/dL Final  . Hgb urine dipstick 05/10/2016 NEGATIVE  NEGATIVE Final  . Bilirubin Urine 05/10/2016 NEGATIVE  NEGATIVE Final  . Ketones, ur 05/10/2016 NEGATIVE  NEGATIVE mg/dL Final  . Protein, ur 05/10/2016 NEGATIVE  NEGATIVE mg/dL Final  . Nitrite 05/10/2016 NEGATIVE  NEGATIVE Final  . Leukocytes, UA 05/10/2016 MODERATE* NEGATIVE Final  . RBC / HPF 05/10/2016 0-5  0 - 5 RBC/hpf Final  . WBC, UA 05/10/2016 6-30  0 - 5 WBC/hpf Final  . Bacteria, UA 05/10/2016 RARE* NONE SEEN Final  . Squamous Epithelial / LPF 05/10/2016 0-5* NONE SEEN Final  . Influenza A By PCR 05/10/2016 NEGATIVE  NEGATIVE Final  . Influenza B By PCR 05/10/2016 NEGATIVE  NEGATIVE Final   Comment: (NOTE) The Xpert Xpress Flu assay is intended as an aid in the diagnosis of  influenza and should not be used as a sole basis for treatment.  This  assay is FDA approved for nasopharyngeal swab specimens only. Nasal  washings and aspirates are unacceptable for Xpert Xpress Flu testing.   . Lactic Acid, Venous  05/10/2016 1.2  0.5 - 1.9 mmol/L Final  . Lactic Acid, Venous 05/11/2016 1.4  0.5 - 1.9 mmol/L Final  . Procalcitonin 05/11/2016 13.81  ng/mL Final   Comment:        Interpretation: PCT >= 10 ng/mL: Important systemic inflammatory response, almost exclusively due to severe bacterial sepsis or septic shock. (NOTE)         ICU PCT Algorithm               Non ICU PCT Algorithm    ----------------------------     ------------------------------         PCT < 0.25 ng/mL                 PCT < 0.1 ng/mL     Stopping of antibiotics            Stopping of antibiotics       strongly encouraged.               strongly encouraged.    ----------------------------     ------------------------------       PCT level decrease by               PCT < 0.25 ng/mL       >= 80% from peak PCT  OR PCT 0.25 - 0.5 ng/mL          Stopping of antibiotics                                             encouraged.     Stopping of antibiotics           encouraged.    ----------------------------     ------------------------------       PCT level decrease by              PCT >= 0.25 ng/mL       < 80% from peak PCT        AND PCT >= 0.5 ng/mL                                      Continuing antibiotics                                              encouraged.       Continuing antibiotics            encouraged.    ----------------------------     ------------------------------     PCT level increase compared          PCT > 0.5 ng/mL         with peak PCT AND          PCT >= 0.5 ng/mL             Escalation of antibiotics                                          strongly encouraged.      Escalation of antibiotics        strongly encouraged.   [name] aPTT 05/11/2016 54* 24 - 36 seconds Final   Comment:        IF BASELINE aPTT IS ELEVATED, SUGGEST PATIENT RISK ASSESSMENT BE USED TO DETERMINE APPROPRIATE ANTICOAGULANT THERAPY.   . Sodium 05/11/2016 134* 135 - 145 mmol/L Final  . Potassium 05/11/2016 3.5  3.5 - 5.1  mmol/L Final  . Chloride 05/11/2016 104  101 - 111 mmol/L Final  . CO2 05/11/2016 24  22 - 32 mmol/L Final  . Glucose, Bld 05/11/2016 116* 65 - 99 mg/dL Final  . BUN 05/11/2016 17  6 - 20 mg/dL Final  . Creatinine, Ser 05/11/2016 0.63  0.44 - 1.00 mg/dL Final  . Calcium 05/11/2016 8.1* 8.9 - 10.3 mg/dL Final  . GFR calc non Af Amer 05/11/2016 >60  >60 mL/min Final  . GFR calc Af Amer 05/11/2016 >60  >60 mL/min Final   Comment: (NOTE) The eGFR has been calculated using the CKD EPI equation. This calculation has not been validated in all clinical situations. eGFR's persistently <60 mL/min signify possible Chronic Kidney Disease.   . Anion gap 05/11/2016 6  5 - 15 Final  . WBC 05/11/2016 16.6* 4.0 - 10.5 K/uL Final  . RBC 05/11/2016 3.32* 3.87 - 5.11 MIL/uL Final  . Hemoglobin 05/11/2016 10.1* 12.0 - 15.0 g/dL  Final  . HCT 05/11/2016 30.6* 36.0 - 46.0 % Final  . MCV 05/11/2016 92.2  78.0 - 100.0 fL Final  . MCH 05/11/2016 30.4  26.0 - 34.0 pg Final  . MCHC 05/11/2016 33.0  30.0 - 36.0 g/dL Final  . RDW 05/11/2016 16.5* 11.5 - 15.5 % Final  . Platelets 05/11/2016 93* 150 - 400 K/uL Final  . Heparin Unfractionated 05/11/2016 1.60* 0.30 - 0.70 IU/mL Final   Comment: RESULTS CONFIRMED BY MANUAL DILUTION        IF HEPARIN RESULTS ARE BELOW EXPECTED VALUES, AND PATIENT DOSAGE HAS BEEN CONFIRMED, SUGGEST FOLLOW UP TESTING OF ANTITHROMBIN III LEVELS.   . Sed Rate 05/11/2016 45* 0 - 22 mm/hr Final  . CRP 05/11/2016 7.0* <1.0 mg/dL Final  . Heparin Unfractionated 05/11/2016 1.73* 0.30 - 0.70 IU/mL Final   Comment:        IF HEPARIN RESULTS ARE BELOW EXPECTED VALUES, AND PATIENT DOSAGE HAS BEEN CONFIRMED, SUGGEST FOLLOW UP TESTING OF ANTITHROMBIN III LEVELS. RESULTS CONFIRMED BY MANUAL DILUTION   . aPTT 05/11/2016 186* 24 - 36 seconds Final   Comment:        IF BASELINE aPTT IS ELEVATED, SUGGEST PATIENT RISK ASSESSMENT BE USED TO DETERMINE APPROPRIATE ANTICOAGULANT  THERAPY. REPEATED TO VERIFY CRITICAL RESULT CALLED TO, READ BACK BY AND VERIFIED WITH: J EDWARDS AT 0949 ON 12.17.2017 BY NBROOKS   . Glucose-Capillary 05/11/2016 88  65 - 99 mg/dL Final  . CRP 05/12/2016 14.2* <1.0 mg/dL Final  . Sed Rate 05/12/2016 68* 0 - 22 mm/hr Final  . WBC 05/12/2016 9.4  4.0 - 10.5 K/uL Final  . RBC 05/12/2016 3.07* 3.87 - 5.11 MIL/uL Final  . Hemoglobin 05/12/2016 9.4* 12.0 - 15.0 g/dL Final  . HCT 05/12/2016 28.6* 36.0 - 46.0 % Final  . MCV 05/12/2016 93.2  78.0 - 100.0 fL Final  . MCH 05/12/2016 30.6  26.0 - 34.0 pg Final  . MCHC 05/12/2016 32.9  30.0 - 36.0 g/dL Final  . RDW 05/12/2016 16.8* 11.5 - 15.5 % Final  . Platelets 05/12/2016 98* 150 - 400 K/uL Final  . Sodium 05/12/2016 138  135 - 145 mmol/L Final  . Potassium 05/12/2016 3.3* 3.5 - 5.1 mmol/L Final  . Chloride 05/12/2016 108  101 - 111 mmol/L Final  . CO2 05/12/2016 25  22 - 32 mmol/L Final  . Glucose, Bld 05/12/2016 104* 65 - 99 mg/dL Final  . BUN 05/12/2016 12  6 - 20 mg/dL Final  . Creatinine, Ser 05/12/2016 0.48  0.44 - 1.00 mg/dL Final  . Calcium 05/12/2016 7.9* 8.9 - 10.3 mg/dL Final  . GFR calc non Af Amer 05/12/2016 >60  >60 mL/min Final  . GFR calc Af Amer 05/12/2016 >60  >60 mL/min Final   Comment: (NOTE) The eGFR has been calculated using the CKD EPI equation. This calculation has not been validated in all clinical situations. eGFR's persistently <60 mL/min signify possible Chronic Kidney Disease.   . Anion gap 05/12/2016 5  5 - 15 Final  . Glucose-Capillary 05/12/2016 132* 65 - 99 mg/dL Final  . Comment 1 05/12/2016 Notify RN   Final  Admission on 04/12/2016, Discharged on 04/13/2016  Component Date Value Ref Range Status  . WBC 04/12/2016 3.9* 4.0 - 10.5 K/uL Final  . RBC 04/12/2016 2.88* 3.87 - 5.11 MIL/uL Final  . Hemoglobin 04/12/2016 8.8* 12.0 - 15.0 g/dL Final  . HCT 04/12/2016 27.8* 36.0 - 46.0 % Final  . MCV 04/12/2016 96.5  78.0 -  100.0 fL Final  . MCH 04/12/2016  30.6  26.0 - 34.0 pg Final  . MCHC 04/12/2016 31.7  30.0 - 36.0 g/dL Final  . RDW 04/12/2016 18.5* 11.5 - 15.5 % Final  . Platelets 04/12/2016 287  150 - 400 K/uL Final  . Neutrophils Relative % 04/12/2016 62  % Final  . Neutro Abs 04/12/2016 2.4  1.7 - 7.7 K/uL Final  . Lymphocytes Relative 04/12/2016 17  % Final  . Lymphs Abs 04/12/2016 0.7  0.7 - 4.0 K/uL Final  . Monocytes Relative 04/12/2016 17  % Final  . Monocytes Absolute 04/12/2016 0.7  0.1 - 1.0 K/uL Final  . Eosinophils Relative 04/12/2016 3  % Final  . Eosinophils Absolute 04/12/2016 0.1  0.0 - 0.7 K/uL Final  . Basophils Relative 04/12/2016 1  % Final  . Basophils Absolute 04/12/2016 0.0  0.0 - 0.1 K/uL Final  . Sodium 04/12/2016 137  135 - 145 mmol/L Final  . Potassium 04/12/2016 3.4* 3.5 - 5.1 mmol/L Final  . Chloride 04/12/2016 102  101 - 111 mmol/L Final  . CO2 04/12/2016 27  22 - 32 mmol/L Final  . Glucose, Bld 04/12/2016 87  65 - 99 mg/dL Final  . BUN 04/12/2016 14  6 - 20 mg/dL Final  . Creatinine, Ser 04/12/2016 0.49  0.44 - 1.00 mg/dL Final  . Calcium 04/12/2016 9.0  8.9 - 10.3 mg/dL Final  . GFR calc non Af Amer 04/12/2016 >60  >60 mL/min Final  . GFR calc Af Amer 04/12/2016 >60  >60 mL/min Final   Comment: (NOTE) The eGFR has been calculated using the CKD EPI equation. This calculation has not been validated in all clinical situations. eGFR's persistently <60 mL/min signify possible Chronic Kidney Disease.   . Anion gap 04/12/2016 8  5 - 15 Final  . Prothrombin Time 04/12/2016 16.7* 11.4 - 15.2 seconds Final  . INR 04/12/2016 1.34   Final  . ABO/RH(D) 04/14/2016 A POS   Final  . Antibody Screen 04/14/2016 NEG   Final  . Sample Expiration 04/14/2016 04/15/2016   Final  . Unit Number 04/14/2016 [id number]   Final  . Blood Component Type 04/14/2016 RED CELLS,LR   Final  . Unit division 04/14/2016 00   Final  . Status of Unit 04/14/2016 ISSUED,FINAL   Final  . Transfusion Status 04/14/2016 OK TO  TRANSFUSE   Final  . Crossmatch Result 04/14/2016 Compatible   Final  . Unit Number 04/14/2016 [id number]   Final  . Blood Component Type 04/14/2016 RED CELLS,LR   Final  . Unit division 04/14/2016 00   Final  . Status of Unit 04/14/2016 ISSUED,FINAL   Final  . Transfusion Status 04/14/2016 OK TO TRANSFUSE   Final  . Crossmatch Result 04/14/2016 Compatible   Final  . Hemoglobin 04/12/2016 10.3* 12.0 - 15.0 g/dL Final  . HCT 04/12/2016 32.2* 36.0 - 46.0 % Final  . Order Confirmation 04/12/2016 ORDER PROCESSED BY BLOOD BANK   Final  . Order Confirmation 04/13/2016 ORDER PROCESSED BY BLOOD BANK   Final  . WBC 04/13/2016 3.5* 4.0 - 10.5 K/uL Final  . RBC 04/13/2016 3.01* 3.87 - 5.11 MIL/uL Final  . Hemoglobin 04/13/2016 9.2* 12.0 - 15.0 g/dL Final  . HCT 04/13/2016 28.0* 36.0 - 46.0 % Final  . MCV 04/13/2016 93.0  78.0 - 100.0 fL Final  . MCH 04/13/2016 30.6  26.0 - 34.0 pg Final  . MCHC 04/13/2016 32.9  30.0 - 36.0 g/dL Final  . RDW 04/13/2016  19.1* 11.5 - 15.5 % Final  . Platelets 04/13/2016 224  150 - 400 K/uL Final     X-Rays:Dg Chest 2 View  Result Date: 05/10/2016 CLINICAL DATA:  Fever.  History of breast cancer. EXAM: CHEST  2 VIEW COMPARISON:  February 01, 2015 FINDINGS: A right Port-A-Cath is stable. No pneumothorax. The cardiomediastinal silhouette is stable. The left hilum is retracted superiorly but this is a stable finding. No pulmonary nodules or masses. No acute interval changes. IMPRESSION: No active cardiopulmonary disease. Electronically Signed   By: Dorise Bullion III M.D   On: 05/10/2016 18:53    EKG: Orders placed or performed during the hospital encounter of 05/10/16  . ED EKG 12-Lead  . ED EKG 12-Lead     Hospital Course: [name] is a 61 y.o. who was admitted to [facility]. They were brought to the operating room on 05/28/2016 for I&D Right knee and application of wound vac, knee was aspirated and fluid sent for stat gram stain which was negative  then on 05/30/16 cultures came back positive for enterococcus faecalis. She was taken back to the OR on 05/30/2016 and underwent Procedure(s): RADICAL SYNOVECTOMY,IRRIGATION AND DEBRIDEMENT KNEE WITH POLY EXCHANGE WITH ANTIBIOTIC BEADS, APPLICATION OF WOUND VAC.  Patient tolerated the procedure well and was later transferred to the recovery room and then to the orthopaedic floor for postoperative care.  They were given PO and IV analgesics for pain control following their surgery.  They were given postoperative antibiotics of Ancef on 05/28/16, Switched to PO Cipro, then pre-op on 1/5 was given vancomycin and has remained on vanco per pharmacy since then. Anti-infectives    Start     Dose/Rate Route Frequency Ordered Stop   06/02/16 0300  vancomycin (VANCOCIN) 1,250 mg in sodium chloride 0.9 % 250 mL IVPB     1,250 mg 166.7 mL/hr over 90 Minutes Intravenous Every 12 hours 06/02/16 0236     06/02/16 0000  vancomycin 1,250 mg in sodium chloride 0.9 % 250 mL     1,250 mg 166.7 mL/hr over 90 Minutes Intravenous Every 12 hours 06/02/16 0858 07/11/16 2359   05/31/16 0200  vancomycin (VANCOCIN) IVPB 1000 mg/200 mL premix  Status:  Discontinued     1,000 mg 200 mL/hr over 60 Minutes Intravenous Every 12 hours 05/30/16 1955 06/02/16 0236   05/30/16 1615  polymyxin B 500,000 Units, bacitracin 50,000 Units in sodium chloride irrigation 0.9 % 500 mL irrigation  Status:  Discontinued       As needed 05/30/16 1615 05/30/16 1657   05/30/16 1500  vancomycin (VANCOCIN) IVPB 1000 mg/200 mL premix     1,000 mg 200 mL/hr over 60 Minutes Intravenous  Once 05/30/16 1416 05/30/16 1519   05/29/16 1100  ciprofloxacin (CIPRO) tablet 500 mg  Status:  Discontinued     500 mg Oral 2 times daily 05/29/16 1023 05/30/16 1850   05/28/16 2000  ciprofloxacin (CIPRO) tablet 500 mg  Status:  Discontinued     500 mg Oral 2 times daily 05/28/16 1732 05/28/16 1737   05/28/16 1424  polymyxin B 500,000 Units, bacitracin 50,000 Units in  sodium chloride irrigation 0.9 % 500 mL irrigation  Status:  Discontinued       As needed 05/28/16 1424 05/28/16 1504   05/28/16 1230  ceFAZolin (ANCEF) IVPB 2 g/50 mL premix     2 g 100 mL/hr over 30 Minutes Intravenous On call to O.R. 05/28/16 1220 05/28/16 1330   05/28/16 1045  vancomycin (  VANCOCIN) IVPB 1000 mg/200 mL premix  Status:  Discontinued     1,000 mg 200 mL/hr over 60 Minutes Intravenous On call to O.R. 05/28/16 1039 05/28/16 1732     and started on DVT prophylaxis in the form of TED hose and Eliquis, and SCDs.   PT and OT were ordered for total joint protocol.  Discharge planning consulted to help with postop disposition and equipment needs.  Patient had a fair night on the evening of surgery.  They started to get up OOB with therapy on day one. Continued to work with therapy daily although flexion was limited.  By day three, the patient had progressed with therapy and meeting their goals. ID was consulted for abx recommendations, vancomycin per pharmacy which was dosed at 1,274m q12h and recommended x 6 weeks. She will then be placed on chronic PO suppressive abx given this infx and her immunocompromised status. Incision was healing well. Wound vac and dressing were changed prior to D/C on POD 3 from second procedure. Patient was seen in rounds daily and was ready to go home.   Diet: Regular diet Activity:WBAT Follow-up:in 10-14 days Disposition - Home with home health for IV abx and wound vac mgmt Discharged Condition: good    Allergies as of 06/02/2016      Reactions   Tape Hives   Can tolerate paper tape   Other Rash   STERI STRIPS - Blisters   Penicillins Rash   Denies airway involvement Has patient had a PCN reaction causing immediate rash, facial/tongue/throat swelling, SOB or lightheadedness with hypotension: No Has patient had a PCN reaction causing severe rash involving mucus membranes or skin necrosis: No Has patient had a PCN reaction that required  hospitalization No Has patient had a PCN reaction occurring within the last 10 years: No If all of the above answers are "NO", then may proceed with Cephalosporin use.      Medication List    STOP taking these medications   ciprofloxacin 500 MG tablet Commonly known as:  CIPRO     TAKE these medications   acetaminophen 325 MG tablet Commonly known as:  TYLENOL Take 2 tablets (650 mg total) by mouth every 6 (six) hours as needed for mild pain (or Fever >/= 101).   apixaban 5 MG Tabs tablet Commonly known as:  ELIQUIS Take 5 mg by mouth 2 (two) times daily. Will stop prior to proceure   celecoxib 200 MG capsule Commonly known as:  CELEBREX TAKE 1 CAPSULE BY MOUTH TWICE DAILY   darifenacin 15 MG 24 hr tablet Commonly known as:  ENABLEX Take 15 mg by mouth daily.   docusate sodium 100 MG capsule Commonly known as:  COLACE Take 1 capsule (100 mg total) by mouth 2 (two) times daily as needed for mild constipation.   gabapentin 300 MG capsule Commonly known as:  NEURONTIN TAKE 1 CAPSULE BY MOUTH 3 TIMES DAILY   ondansetron 8 MG tablet Commonly known as:  ZOFRAN Take 1 tablet (8 mg total) by mouth every 8 (eight) hours as needed for nausea.   polyethylene glycol packet Commonly known as:  MIRALAX / GLYCOLAX Take 17 g by mouth daily. What changed:  when to take this  reasons to take this   traMADol 50 MG tablet Commonly known as:  ULTRAM TAKE 1 TABLET BY MOUTH EVERY 6-8 HOURS AS NEEDED FOR PAIN   vancomycin 1,250 mg in sodium chloride 0.9 % 250 mL Inject 1,250 mg into the vein every 12 (  twelve) hours. Through port-a-cath. Duration 6 weeks.   Vitamin D3 2000 units Tabs Take 1 tablet by mouth 4 (four) times a week.   zolpidem 5 MG tablet Commonly known as:  AMBIEN TAKE 1 TABLET BY MOUTH AT BEDTIME AS NEEDED FOR SLEEP     Oxycodone 35m tablet Take 1 tablet by mouth every 4 hours as needed for breakthrough pain   Follow-up Information    [facility]   Why:  home health nurse for dressing changes Contact information: [address] [place] [id number]        BJohnn Hai MD Follow up in 1 week(s).   Specialty:  Orthopedic Surgery Contact information: [address] [phone]       JMichel Bickers MD Follow up in 1 month(s).   Specialty:  Infectious Diseases Contact information: [phone]           Signed: JLacie Draft PA-C Orthopaedic Surgery 06/02/2016, 9:00 AM

## 2016-06-02 NOTE — Consult Note (Signed)
[place] Nurse wound consult note Reason for Consult:Assist Dr. Tonita Cong with NPWT first surgical dressing change. Wound type:Surgical Pressure Injury POA:No Measurement: Central wound measuring 3cm x 2.6cm x 2cm with minor undermining from 12-3 o'clock measuring 0.4cm. Wound bed 80% red, moist, 20% white (slough vs. Tendon), serous exudate. 10cm closely approximated incision proximal to central wound with sutures intact. Small area with serosanguinous exudate in center. Wound bed:As described above. Drainage (amount, consistency, odor) As described above Periwound: Intact with periwound erythema measuring 3cm x 4cm medially to central wound. No warmth, no induration. Dressing procedure/placement/frequency: NPWT removed, wound cleansed and NPWT reapplied. One (1) piece of Mepitel silicone wound contact layer, two pieces of black GranuFoam used.  An immediate seal was achieved.  Patient was premedicated and tolerated procedure well. Supplies for home NPWT ([employer] are delivered to room and NPWT unit is attached to wall outlet so that battery remains charged. Two pieces of Mepitel are placed in box to send home with patient. Bedside RN (or [facility]) may change this dressing. [place] nursing team will not follow, but will remain available to this patient, the nursing, surgical and medical teams.  Please re-consult if needed. Thanks, Maudie Flakes, MSN, RN, [place], Arther Abbott  Pager# [phone]

## 2016-06-02 NOTE — Progress Notes (Signed)
[facility] is providing  HHRN and Home Infusion Services for home IV ABX upon discharge. In hospital teaching regarding IV Vancomycin set up and administration provided for pt to support independence at home.  [facility] is prepared for DC on 06-03-16 to support PM Vancomycin dose.  If patient discharges after hours, please call [phone].   Larry Sierras 06/02/2016, 3:52 PM

## 2016-06-03 ENCOUNTER — Ambulatory Visit: Payer: Self-pay

## 2016-06-03 LAB — CBC WITH DIFFERENTIAL/PLATELET
BASOS ABS: 0 10*3/uL (ref 0.0–0.1)
BASOS PCT: 0 %
EOS PCT: 5 %
Eosinophils Absolute: 0.3 10*3/uL (ref 0.0–0.7)
HCT: 27.1 % — ABNORMAL LOW (ref 36.0–46.0)
Hemoglobin: 9.1 g/dL — ABNORMAL LOW (ref 12.0–15.0)
Lymphocytes Relative: 25 %
Lymphs Abs: 1.4 10*3/uL (ref 0.7–4.0)
MCH: 30.2 pg (ref 26.0–34.0)
MCHC: 33.6 g/dL (ref 30.0–36.0)
MCV: 90 fL (ref 78.0–100.0)
MONO ABS: 0.7 10*3/uL (ref 0.1–1.0)
Monocytes Relative: 13 %
Neutro Abs: 3.2 10*3/uL (ref 1.7–7.7)
Neutrophils Relative %: 57 %
PLATELETS: 147 10*3/uL — AB (ref 150–400)
RBC: 3.01 MIL/uL — AB (ref 3.87–5.11)
RDW: 16.6 % — AB (ref 11.5–15.5)
WBC: 5.5 10*3/uL (ref 4.0–10.5)

## 2016-06-03 LAB — TYPE AND SCREEN
ABO/RH(D): A POS
Antibody Screen: NEGATIVE
UNIT DIVISION: 0
Unit division: 0

## 2016-06-03 MED ORDER — HEPARIN SOD (PORK) LOCK FLUSH 100 UNIT/ML IV SOLN
500.0000 [IU] | INTRAVENOUS | Status: AC | PRN
  Administered 2016-06-03: 500 [IU]

## 2016-06-03 MED ORDER — OXYCODONE HCL 5 MG PO TABS: 5.0000 mg | ORAL_TABLET | ORAL | 0 refills | Status: DC | PRN

## 2016-06-03 NOTE — Progress Notes (Signed)
Subjective: 4 Days Post-Op Procedure(s) (LRB): RADICAL SYNOVECTOMY,IRRIGATION AND DEBRIDEMENT KNEE WITH POLY EXCHANGE WITH ANTIBIOTIC BEADS, APPLICATION OF WOUND VAC (Right) Patient reports pain as mild. Pain well controlled. Feeling better than yesterday. Ready to go home.    Objective: Vital signs in last 24 hours: Temp:  [98 F (36.7 C)-100.2 F (37.9 C)] 98.5 F (36.9 C) (01/09 0711) Pulse Rate:  [94-117] 94 (01/09 0711) Resp:  [16-18] 16 (01/09 0711) BP: (105-121)/(48-56) 117/52 (01/09 0711) SpO2:  [93 %-96 %] 95 % (01/09 0711)  Intake/Output from previous day: 01/08 0701 - 01/09 0700 In: 2704.3 [P.O.:1080; I.V.:454.3; Blood:670; IV Piggyback:500] Out: 1650 [Urine:1650] Intake/Output this shift: No intake/output data recorded.   Recent Labs  06/01/16 0453 06/02/16 1010 06/03/16 0216  HGB 9.0* 7.8* 9.1*    Recent Labs  06/02/16 1010 06/03/16 0216  WBC 6.1 5.5  RBC 2.68* 3.01*  HCT 24.3* 27.1*  PLT 162 147*    Recent Labs  06/02/16 1010  NA 135  K 3.5  CL 97*  CO2 30  BUN 8  CREATININE 0.47  GLUCOSE 99  CALCIUM 8.6*   No results for input(s): LABPT, INR in the last 72 hours.  Neurologically intact ABD soft Neurovascular intact Sensation intact distally Intact pulses distally Dorsiflexion/Plantar flexion intact Incision: dressing C/D/I and scant drainage No cellulitis present Compartment soft dressing dry, changed yesterday. no sign of DVT  Assessment/Plan: 4 Days Post-Op Procedure(s) (LRB): RADICAL SYNOVECTOMY,IRRIGATION AND DEBRIDEMENT KNEE WITH POLY EXCHANGE WITH ANTIBIOTIC BEADS, APPLICATION OF WOUND VAC (Right) Advance diet Up with therapy  Acute on chronic anemia- Hgb improved to 9.1 today from 7.8 yesterday Pt gets Hgb checked q3 weeks with her chemo May require more frequent home checks given wound vac and eliquis use- will discuss with Dr. Levell July at home, Rx written Oxy for breakthrough pain at home, Rx written D/C home  today with home health  Lacie Draft M. 06/03/2016, 8:07 AM

## 2016-06-03 NOTE — Discharge Instructions (Signed)
Elevate leg above heart 6x a day for 22mnutes each Use knee immobilizer at all times- no flexion Continuous wound vac per home health  Follow up with Dr. BTonita Congin office 2 weeks post-op Follow up with Dr. CMegan Salonin 1 month  Vancomycin and lab instructions per Dr. CMegan Salonas follows: Discharge antibiotics: Per pharmacy protocol Vancomycin Aim for Vancomycin trough 15-20 (unless otherwise indicated) Duration: 6 weeks End Date: 07/11/2016  [facility]: She has a Port-A-Cath  Labs weekly while on IV antibiotics: _x_ CBC with differential _x_ BMP __ CMP _x_ CRP _x_ ESR _x_ Vancomycin trough  _NA_ Please pull PIC at completion of IV antibiotics _NA_ Please leave PIC in place until doctor has seen patient or been notified  Fax weekly labs to [phone]

## 2016-06-04 LAB — MISC LABCORP TEST (SEND OUT)
LABCORP TEST CODE: 96388
LabCorp test name: 1

## 2016-06-05 DIAGNOSIS — Z5181 Encounter for therapeutic drug level monitoring: Secondary | ICD-10-CM | POA: Diagnosis not present

## 2016-06-05 DIAGNOSIS — Z7901 Long term (current) use of anticoagulants: Secondary | ICD-10-CM | POA: Diagnosis not present

## 2016-06-05 DIAGNOSIS — Z452 Encounter for adjustment and management of vascular access device: Secondary | ICD-10-CM | POA: Diagnosis not present

## 2016-06-05 DIAGNOSIS — T8131XA Disruption of external operation (surgical) wound, not elsewhere classified, initial encounter: Secondary | ICD-10-CM | POA: Diagnosis not present

## 2016-06-05 DIAGNOSIS — I10 Essential (primary) hypertension: Secondary | ICD-10-CM | POA: Diagnosis not present

## 2016-06-07 DIAGNOSIS — T8131XA Disruption of external operation (surgical) wound, not elsewhere classified, initial encounter: Secondary | ICD-10-CM | POA: Diagnosis not present

## 2016-06-07 DIAGNOSIS — Z5181 Encounter for therapeutic drug level monitoring: Secondary | ICD-10-CM | POA: Diagnosis not present

## 2016-06-07 DIAGNOSIS — I10 Essential (primary) hypertension: Secondary | ICD-10-CM | POA: Diagnosis not present

## 2016-06-07 DIAGNOSIS — Z7901 Long term (current) use of anticoagulants: Secondary | ICD-10-CM | POA: Diagnosis not present

## 2016-06-07 DIAGNOSIS — Z452 Encounter for adjustment and management of vascular access device: Secondary | ICD-10-CM | POA: Diagnosis not present

## 2016-06-09 DIAGNOSIS — I10 Essential (primary) hypertension: Secondary | ICD-10-CM | POA: Diagnosis not present

## 2016-06-09 DIAGNOSIS — Z5181 Encounter for therapeutic drug level monitoring: Secondary | ICD-10-CM | POA: Diagnosis not present

## 2016-06-09 DIAGNOSIS — Z7901 Long term (current) use of anticoagulants: Secondary | ICD-10-CM | POA: Diagnosis not present

## 2016-06-09 DIAGNOSIS — Z452 Encounter for adjustment and management of vascular access device: Secondary | ICD-10-CM | POA: Diagnosis not present

## 2016-06-09 DIAGNOSIS — T881XXA Other complications following immunization, not elsewhere classified, initial encounter: Secondary | ICD-10-CM | POA: Diagnosis not present

## 2016-06-09 DIAGNOSIS — T8131XA Disruption of external operation (surgical) wound, not elsewhere classified, initial encounter: Secondary | ICD-10-CM | POA: Diagnosis not present

## 2016-06-09 LAB — BODY FLUID CULTURE

## 2016-06-10 DIAGNOSIS — C50412 Malignant neoplasm of upper-outer quadrant of left female breast: Secondary | ICD-10-CM | POA: Diagnosis not present

## 2016-06-10 DIAGNOSIS — Z171 Estrogen receptor negative status [ER-]: Secondary | ICD-10-CM | POA: Diagnosis not present

## 2016-06-10 DIAGNOSIS — Z5112 Encounter for antineoplastic immunotherapy: Secondary | ICD-10-CM | POA: Diagnosis not present

## 2016-06-10 DIAGNOSIS — C792 Secondary malignant neoplasm of skin: Secondary | ICD-10-CM | POA: Diagnosis not present

## 2016-06-11 DIAGNOSIS — A4902 Methicillin resistant Staphylococcus aureus infection, unspecified site: Secondary | ICD-10-CM | POA: Diagnosis not present

## 2016-06-11 DIAGNOSIS — Z96659 Presence of unspecified artificial knee joint: Secondary | ICD-10-CM | POA: Diagnosis not present

## 2016-06-12 DIAGNOSIS — T8131XA Disruption of external operation (surgical) wound, not elsewhere classified, initial encounter: Secondary | ICD-10-CM | POA: Diagnosis not present

## 2016-06-12 DIAGNOSIS — Z5181 Encounter for therapeutic drug level monitoring: Secondary | ICD-10-CM | POA: Diagnosis not present

## 2016-06-12 DIAGNOSIS — I10 Essential (primary) hypertension: Secondary | ICD-10-CM | POA: Diagnosis not present

## 2016-06-12 DIAGNOSIS — Z7901 Long term (current) use of anticoagulants: Secondary | ICD-10-CM | POA: Diagnosis not present

## 2016-06-12 DIAGNOSIS — Z452 Encounter for adjustment and management of vascular access device: Secondary | ICD-10-CM | POA: Diagnosis not present

## 2016-06-13 DIAGNOSIS — Z96659 Presence of unspecified artificial knee joint: Secondary | ICD-10-CM | POA: Diagnosis not present

## 2016-06-13 DIAGNOSIS — A4902 Methicillin resistant Staphylococcus aureus infection, unspecified site: Secondary | ICD-10-CM | POA: Diagnosis not present

## 2016-06-14 DIAGNOSIS — Z5181 Encounter for therapeutic drug level monitoring: Secondary | ICD-10-CM | POA: Diagnosis not present

## 2016-06-14 DIAGNOSIS — I10 Essential (primary) hypertension: Secondary | ICD-10-CM | POA: Diagnosis not present

## 2016-06-14 DIAGNOSIS — T8131XA Disruption of external operation (surgical) wound, not elsewhere classified, initial encounter: Secondary | ICD-10-CM | POA: Diagnosis not present

## 2016-06-14 DIAGNOSIS — Z7901 Long term (current) use of anticoagulants: Secondary | ICD-10-CM | POA: Diagnosis not present

## 2016-06-14 DIAGNOSIS — Z452 Encounter for adjustment and management of vascular access device: Secondary | ICD-10-CM | POA: Diagnosis not present

## 2016-06-16 ENCOUNTER — Ambulatory Visit: Payer: Self-pay | Admitting: Internal Medicine

## 2016-06-16 DIAGNOSIS — Z7901 Long term (current) use of anticoagulants: Secondary | ICD-10-CM | POA: Diagnosis not present

## 2016-06-16 DIAGNOSIS — I10 Essential (primary) hypertension: Secondary | ICD-10-CM | POA: Diagnosis not present

## 2016-06-16 DIAGNOSIS — Z452 Encounter for adjustment and management of vascular access device: Secondary | ICD-10-CM | POA: Diagnosis not present

## 2016-06-16 DIAGNOSIS — Z5181 Encounter for therapeutic drug level monitoring: Secondary | ICD-10-CM | POA: Diagnosis not present

## 2016-06-16 DIAGNOSIS — T8131XA Disruption of external operation (surgical) wound, not elsewhere classified, initial encounter: Secondary | ICD-10-CM | POA: Diagnosis not present

## 2016-06-18 DIAGNOSIS — I10 Essential (primary) hypertension: Secondary | ICD-10-CM | POA: Diagnosis not present

## 2016-06-18 DIAGNOSIS — Z5181 Encounter for therapeutic drug level monitoring: Secondary | ICD-10-CM | POA: Diagnosis not present

## 2016-06-18 DIAGNOSIS — T8131XA Disruption of external operation (surgical) wound, not elsewhere classified, initial encounter: Secondary | ICD-10-CM | POA: Diagnosis not present

## 2016-06-18 DIAGNOSIS — Z452 Encounter for adjustment and management of vascular access device: Secondary | ICD-10-CM | POA: Diagnosis not present

## 2016-06-18 DIAGNOSIS — Z7901 Long term (current) use of anticoagulants: Secondary | ICD-10-CM | POA: Diagnosis not present

## 2016-06-19 MED FILL — DARIFENACIN ER 15 MG TABLET: 15 | 30 days supply | Qty: 30 | Fill #6

## 2016-06-19 MED FILL — ELIQUIS 5 MG TABLET: 5 | 30 days supply | Qty: 60 | Fill #0

## 2016-06-19 MED FILL — GABAPENTIN 300 MG CAPSULE: 300 | 30 days supply | Qty: 90 | Fill #3

## 2016-06-20 DIAGNOSIS — A4902 Methicillin resistant Staphylococcus aureus infection, unspecified site: Secondary | ICD-10-CM | POA: Diagnosis not present

## 2016-06-20 DIAGNOSIS — Z96659 Presence of unspecified artificial knee joint: Secondary | ICD-10-CM | POA: Diagnosis not present

## 2016-06-23 DIAGNOSIS — Z5181 Encounter for therapeutic drug level monitoring: Secondary | ICD-10-CM | POA: Diagnosis not present

## 2016-06-23 DIAGNOSIS — I10 Essential (primary) hypertension: Secondary | ICD-10-CM | POA: Diagnosis not present

## 2016-06-23 DIAGNOSIS — Z452 Encounter for adjustment and management of vascular access device: Secondary | ICD-10-CM | POA: Diagnosis not present

## 2016-06-23 DIAGNOSIS — T8131XA Disruption of external operation (surgical) wound, not elsewhere classified, initial encounter: Secondary | ICD-10-CM | POA: Diagnosis not present

## 2016-06-23 DIAGNOSIS — Z7901 Long term (current) use of anticoagulants: Secondary | ICD-10-CM | POA: Diagnosis not present

## 2016-06-25 DIAGNOSIS — Z452 Encounter for adjustment and management of vascular access device: Secondary | ICD-10-CM | POA: Diagnosis not present

## 2016-06-25 DIAGNOSIS — I10 Essential (primary) hypertension: Secondary | ICD-10-CM | POA: Diagnosis not present

## 2016-06-25 DIAGNOSIS — Z5181 Encounter for therapeutic drug level monitoring: Secondary | ICD-10-CM | POA: Diagnosis not present

## 2016-06-25 DIAGNOSIS — Z7901 Long term (current) use of anticoagulants: Secondary | ICD-10-CM | POA: Diagnosis not present

## 2016-06-25 DIAGNOSIS — T8131XA Disruption of external operation (surgical) wound, not elsewhere classified, initial encounter: Secondary | ICD-10-CM | POA: Diagnosis not present

## 2016-06-27 DIAGNOSIS — T8131XA Disruption of external operation (surgical) wound, not elsewhere classified, initial encounter: Secondary | ICD-10-CM | POA: Diagnosis not present

## 2016-06-27 DIAGNOSIS — Z5181 Encounter for therapeutic drug level monitoring: Secondary | ICD-10-CM | POA: Diagnosis not present

## 2016-06-27 DIAGNOSIS — Z452 Encounter for adjustment and management of vascular access device: Secondary | ICD-10-CM | POA: Diagnosis not present

## 2016-06-27 DIAGNOSIS — I10 Essential (primary) hypertension: Secondary | ICD-10-CM | POA: Diagnosis not present

## 2016-06-27 DIAGNOSIS — Z7901 Long term (current) use of anticoagulants: Secondary | ICD-10-CM | POA: Diagnosis not present

## 2016-06-28 DIAGNOSIS — A4902 Methicillin resistant Staphylococcus aureus infection, unspecified site: Secondary | ICD-10-CM | POA: Diagnosis not present

## 2016-06-28 DIAGNOSIS — Z96659 Presence of unspecified artificial knee joint: Secondary | ICD-10-CM | POA: Diagnosis not present

## 2016-06-28 DIAGNOSIS — T8189XA Other complications of procedures, not elsewhere classified, initial encounter: Secondary | ICD-10-CM | POA: Diagnosis not present

## 2016-06-29 DIAGNOSIS — T8189XA Other complications of procedures, not elsewhere classified, initial encounter: Secondary | ICD-10-CM | POA: Diagnosis not present

## 2016-06-30 DIAGNOSIS — Z9013 Acquired absence of bilateral breasts and nipples: Secondary | ICD-10-CM | POA: Diagnosis not present

## 2016-06-30 DIAGNOSIS — Z5112 Encounter for antineoplastic immunotherapy: Secondary | ICD-10-CM | POA: Diagnosis not present

## 2016-06-30 DIAGNOSIS — Z86718 Personal history of other venous thrombosis and embolism: Secondary | ICD-10-CM | POA: Diagnosis not present

## 2016-06-30 DIAGNOSIS — I749 Embolism and thrombosis of unspecified artery: Secondary | ICD-10-CM | POA: Diagnosis not present

## 2016-06-30 DIAGNOSIS — Z923 Personal history of irradiation: Secondary | ICD-10-CM | POA: Diagnosis not present

## 2016-06-30 DIAGNOSIS — Z7901 Long term (current) use of anticoagulants: Secondary | ICD-10-CM | POA: Diagnosis not present

## 2016-06-30 DIAGNOSIS — D509 Iron deficiency anemia, unspecified: Secondary | ICD-10-CM | POA: Diagnosis not present

## 2016-06-30 DIAGNOSIS — C792 Secondary malignant neoplasm of skin: Secondary | ICD-10-CM | POA: Diagnosis not present

## 2016-06-30 DIAGNOSIS — C50412 Malignant neoplasm of upper-outer quadrant of left female breast: Secondary | ICD-10-CM | POA: Diagnosis not present

## 2016-07-01 DIAGNOSIS — Z7901 Long term (current) use of anticoagulants: Secondary | ICD-10-CM | POA: Diagnosis not present

## 2016-07-01 DIAGNOSIS — T8131XA Disruption of external operation (surgical) wound, not elsewhere classified, initial encounter: Secondary | ICD-10-CM | POA: Diagnosis not present

## 2016-07-01 DIAGNOSIS — Z5181 Encounter for therapeutic drug level monitoring: Secondary | ICD-10-CM | POA: Diagnosis not present

## 2016-07-01 DIAGNOSIS — Z452 Encounter for adjustment and management of vascular access device: Secondary | ICD-10-CM | POA: Diagnosis not present

## 2016-07-01 DIAGNOSIS — I10 Essential (primary) hypertension: Secondary | ICD-10-CM | POA: Diagnosis not present

## 2016-07-03 MED FILL — ZOLPIDEM TARTRATE 5 MG TAB: 5 | 30 days supply | Qty: 30 | Fill #3

## 2016-07-04 DIAGNOSIS — T8131XA Disruption of external operation (surgical) wound, not elsewhere classified, initial encounter: Secondary | ICD-10-CM | POA: Diagnosis not present

## 2016-07-04 DIAGNOSIS — Z5181 Encounter for therapeutic drug level monitoring: Secondary | ICD-10-CM | POA: Diagnosis not present

## 2016-07-04 DIAGNOSIS — Z7901 Long term (current) use of anticoagulants: Secondary | ICD-10-CM | POA: Diagnosis not present

## 2016-07-05 DIAGNOSIS — Z96659 Presence of unspecified artificial knee joint: Secondary | ICD-10-CM | POA: Diagnosis not present

## 2016-07-05 DIAGNOSIS — A4902 Methicillin resistant Staphylococcus aureus infection, unspecified site: Secondary | ICD-10-CM | POA: Diagnosis not present

## 2016-07-07 DIAGNOSIS — Z5181 Encounter for therapeutic drug level monitoring: Secondary | ICD-10-CM | POA: Diagnosis not present

## 2016-07-07 DIAGNOSIS — Z7901 Long term (current) use of anticoagulants: Secondary | ICD-10-CM | POA: Diagnosis not present

## 2016-07-07 DIAGNOSIS — E063 Autoimmune thyroiditis: Secondary | ICD-10-CM | POA: Diagnosis not present

## 2016-07-07 DIAGNOSIS — T8131XA Disruption of external operation (surgical) wound, not elsewhere classified, initial encounter: Secondary | ICD-10-CM | POA: Diagnosis not present

## 2016-07-08 ENCOUNTER — Telehealth: Payer: Self-pay | Admitting: *Deleted

## 2016-07-08 ENCOUNTER — Ambulatory Visit (INDEPENDENT_AMBULATORY_CARE_PROVIDER_SITE_OTHER): Payer: 59 | Admitting: Internal Medicine

## 2016-07-08 ENCOUNTER — Telehealth: Payer: Self-pay | Admitting: Pharmacist

## 2016-07-08 ENCOUNTER — Encounter: Payer: Self-pay | Admitting: Internal Medicine

## 2016-07-08 DIAGNOSIS — T8453XD Infection and inflammatory reaction due to internal right knee prosthesis, subsequent encounter: Secondary | ICD-10-CM | POA: Diagnosis not present

## 2016-07-08 NOTE — Assessment & Plan Note (Signed)
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 Since it is unlikely that this will be cured with weeks of IV antibiotic therapy she will need some form of longer-term suppressive therapy. However options for chronic suppressive therapy are quite limited given her amoxicillin allergy. She could take oral linezolid but this is not safe for more than 3-4 weeks of therapy because of concerns about bone marrow suppression, painful peripheral neuropathy and optic neuritis. Her enterococcal isolate is resistant to doxycycline so that is not an option. I do not feel that fluoroquinolone therapy is reliably effective against enterococcus. We will also need to consider desensitization to penicillin. I have spoken to Dr. Allena Katz today and he has graciously agreed to see her in clinic within the next week. I will continue IV vancomycin until we are able to make a decision on long-term oral suppressive antibiotic therapy. She will follow-up here in 6-8 weeks.

## 2016-07-08 NOTE — Progress Notes (Signed)
[place] for Infectious Disease  Patient Active Problem List   Diagnosis Date Noted  . S/P total knee arthroplasty, right 05/31/2016    Priority: High  . Infection of prosthetic right knee joint ([place]) 05/31/2016    Priority: High  . Wound dehiscence, surgical 04/12/2016    Priority: High  . 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 Breast cancer, left breast ([place]) 11/12/2010    Priority: Medium  . Nonhealing surgical wound 05/28/2016  . Sepsis ([place]) 05/10/2016  . UTI (urinary tract infection) 05/10/2016  . Cellulitis of right knee 05/10/2016  . Blood clot in vein   . 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 Vitamin D deficiency 01/25/2016  . Primary osteoarthritis of right knee 02/01/2015  . Anemia 02/01/2015  . Frequent UTI 01/12/2014  . Sinus tachycardia 11/03/2013  . DOE (dyspnea on exertion) 11/03/2013  . Postoperative anemia due to acute blood loss 12/24/2012  .    09/23/2012  . Sinusitis 07/28/2012  . 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 Chronic anticoagulation 11/12/2010  . Anxiety, generalized 03/26/2008    Patient's Medications  New Prescriptions   No medications on file  Previous Medications   ACETAMINOPHEN (TYLENOL) 325 MG TABLET    Take 2 tablets (650 mg total) by mouth every 6 (six) hours as needed for mild pain (or Fever >/= 101).   ADO-TRASTUZUMAB EMTANSINE (KADCYLA IV)    Inject into the vein every 21 ( twenty-one) days.   APIXABAN (ELIQUIS) 5 MG TABS TABLET    Take 5 mg by mouth 2 (two) times daily. Will stop prior to proceure   CELECOXIB (CELEBREX) 200 MG CAPSULE    TAKE 1 CAPSULE BY MOUTH TWICE DAILY   CHOLECALCIFEROL (VITAMIN D3) 2000 UNITS TABS    Take 1 tablet by mouth 4 (four) times a week.    DARIFENACIN (ENABLEX) 15 MG 24 HR TABLET    Take 15 mg by mouth daily.   DOCUSATE SODIUM (COLACE) 100 MG CAPSULE    Take 1 capsule (100 mg total) by mouth 2  (two) times daily as needed for mild constipation.   GABAPENTIN (NEURONTIN) 300 MG CAPSULE    TAKE 1 CAPSULE BY MOUTH 3 TIMES DAILY   ONDANSETRON (ZOFRAN) 8 MG TABLET    Take 1 tablet (8 mg total) by mouth every 8 (eight) hours as needed for nausea.   OXYCODONE (OXY IR/ROXICODONE) 5 MG IMMEDIATE RELEASE TABLET    Take 1 tablet (5 mg total) by mouth every 4 (four) hours as needed for breakthrough pain.   POLYETHYLENE GLYCOL (MIRALAX / GLYCOLAX) PACKET    Take 17 g by mouth daily.   TRAMADOL (ULTRAM) 50 MG TABLET    TAKE 1 TABLET BY MOUTH EVERY 6-8 HOURS AS NEEDED FOR PAIN   VANCOMYCIN 1,250 MG IN SODIUM CHLORIDE 0.9 % 250 ML    Inject 1,250 mg into the vein every 12 (twelve) hours. Through port-a-cath. Duration 6 weeks.   ZOLPIDEM (AMBIEN) 5 MG TABLET    TAKE 1 TABLET BY MOUTH AT BEDTIME AS NEEDED FOR SLEEP  Modified Medications   No medications on file  Discontinued Medications   No medications on file    Subjective: Ms. Hettich is in for her hospital follow-up visit. She is accompanied by her husband. She is a 61 y.o. female with DJD who underwent right total knee arthroplasty on 03/20/2016. She  did well initially but suffered a fall and had dehiscence of her wound. She underwent incision and drainage and closure of the wound on 04/12/2016. She was discharged on a short course of cephalexin. She had persistent wound drainage and was readmitted on 05/10/2016. She had some cellulitis around the wound. She was treated with IV vancomycin and aztreonam then transitioned to oral ciprofloxacin for presumed UTI although she did not have any symptoms of a UTI. She took the ciprofloxacin for 7 days. She had persistent wound drainage and low-grade fevers leading to readmission on 05/28/2016. She underwent incision and drainage and poly-exchange yesterday and had a VAC wound dressing placed. Synovial fluid cultures have grown Enterococcus faecalis. She is currently undergoing chemotherapy at [facility] for recurrence of her breast cancer on her chest wall. She has a history of allergy to amoxicillin. She developed a rash when she took it as a young girl. She also took it again in her late 72s and developed a diffuse, pruritic red rash. She was discharged on IV vancomycin and has now completed 39 days of therapy. She has had no problems tolerating vancomycin. She has been infusing through her Port-A-Cath. She has noted that after she receives chemotherapy every 3 weeks she will have several days of low-grade fevers up to 100.5. Those low-grade fevers resolve spontaneously.  Review of Systems: Review of Systems  Constitutional: Positive for fever and malaise/fatigue. Negative for chills, diaphoresis and weight loss.  HENT: Negative for sore throat.   Respiratory: Negative for cough, sputum production and shortness of breath.   Cardiovascular: Negative for chest pain.  Gastrointestinal: Negative for diarrhea, nausea and vomiting.  Genitourinary: Negative for dysuria and frequency.  Musculoskeletal: Positive for joint pain. Negative for myalgias.  Skin: Negative for rash.  Neurological: Negative for dizziness and headaches.    Past Medical History:  Diagnosis Date  . 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 Arthritis KNEES   right knee, hx. past left knee replacemnt.  . Blood clot in vein    around Portacath right chest-tx. Eliquis about a yr., prior Lovenox.  . Blood dyscrasia   . Chronic anticoagulation HX  PORT-A-CATH CLOT   2010 - HAS BEEN ON LOVENOX SINCE THE CLOT  . Elevated blood pressure reading without diagnosis of hypertension    PT MONITORS HER B/P AT HOME  . Heart murmur   . 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 PONV (postoperative nausea and vomiting)   . Skin cancer of anterior chest BREAST CANCER PRIMARY W/ METS TO CHEST WALL SKIN  CANCER--  CHEMOTHERAPY EVERY 3 WEEKS AT [facility]  . Status post skin flap graft    right chest wall with metastatis right anterior chest -no drainage or open wound.    Social History  Substance Use Topics  . Smoking status: Never Smoker  . Smokeless tobacco: Never Used  . Alcohol use No    Family History  Problem Relation Age of Onset  . Heart disease Mother     due to mitral valve regurgiation,   . Cancer Mother     breast  . Parkinsonism Father   . Alcohol abuse Paternal Grandfather     Allergies  Allergen Reactions  . Tape Hives    Can tolerate paper tape  . Other Rash    STERI STRIPS -  Blisters  . Penicillins Rash    Denies airway involvement Has patient had a PCN reaction causing immediate rash, facial/tongue/throat swelling, SOB or lightheadedness with hypotension: No Has patient had a PCN reaction causing severe rash involving mucus membranes or skin necrosis: No Has patient had a PCN reaction that required hospitalization No Has patient had a PCN reaction occurring within the last 10 years: No If all of the above answers are "NO", then may proceed with Cephalosporin use.      Objective: Vitals:   07/08/16 1025  Weight: 158 lb (71.7 kg)  Height: _0  (1.702 m)   Body mass index is 24.75 kg/m.  Physical Exam  Constitutional: She is oriented to person, place, and time.  She is in good spirits.  Cardiovascular: Normal rate and regular rhythm.   Murmur heard. Pulmonary/Chest: Effort normal and breath sounds normal.  There is a little bit of blood under the clear dressing over her Port-A-Cath on her right anterior chest. Otherwise the site looks good.  Abdominal: Soft. There is no tenderness.  Musculoskeletal: She exhibits no edema or tenderness.  There is an elliptical shaped area of heaped up, red granulation tissue at the site of the distal incision over her right knee. The knee is not unusually swollen, warm, red or painful. There is no drainage or  odor. She has full extension of her right leg and flexion to about 90.  Neurological: She is alert and oriented to person, place, and time.  Skin: No rash noted.  Psychiatric: Mood and affect normal.    Lab Results    Problem List Items Addressed This Visit      High   Infection of prosthetic right knee joint ([place])    Ms. 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 Since it is unlikely that this will be cured with weeks of IV antibiotic therapy she will need some form of longer-term suppressive therapy. However options for chronic suppressive therapy are quite limited given her amoxicillin allergy. She could take oral linezolid but this is not safe for more than 3-4 weeks of therapy because of concerns about bone marrow suppression, painful peripheral neuropathy and optic neuritis. Her enterococcal isolate is resistant to doxycycline so that is not an option. I do not feel that fluoroquinolone therapy is reliably effective against enterococcus. We will also need to consider desensitization to penicillin. I have spoken to Dr. Allena Katz today and he has graciously agreed to see her in clinic within the next week. I will continue IV vancomycin until we are able to make a decision on long-term oral suppressive antibiotic therapy. She will follow-up here in 6-8 weeks.          Michel Bickers, MD [facility] [phone] pager   [phone] cell 07/08/2016, 1:30 PM

## 2016-07-08 NOTE — Telephone Encounter (Signed)
Luanne Bras, pharmacist at [facility], and extended patient's IV vanc until 2/28 per verbal order from Dr. Megan Salon. Jeani Hawking verbalized understanding.

## 2016-07-08 NOTE — Telephone Encounter (Signed)
Appt scheduled with Dr Nelva Bush for Feb 15

## 2016-07-08 NOTE — Telephone Encounter (Signed)
-----   Message from Jiles Prows, MD sent at 07/08/2016  1:25 PM EST ----- Please make a appointment for this patient for Thursday or Friday. Needs PCN testing and possible amoxicillin oral desensitization. Please contact patient today with appointment time.

## 2016-07-08 NOTE — Telephone Encounter (Signed)
Per Dr Neldon Mc this patient was referred by not directly to Dr Neldon Mc for penicillin testing by Dr Megan Salon due to infection that Dr Megan Salon wants to possibly change course of treatment over to PCN drug.  L/M for patient to contact office to schedule NP appt for penicillin testing with any of the other MDs in [place] this week.

## 2016-07-09 DIAGNOSIS — Z5181 Encounter for therapeutic drug level monitoring: Secondary | ICD-10-CM | POA: Diagnosis not present

## 2016-07-09 DIAGNOSIS — T8131XA Disruption of external operation (surgical) wound, not elsewhere classified, initial encounter: Secondary | ICD-10-CM | POA: Diagnosis not present

## 2016-07-09 DIAGNOSIS — Z7901 Long term (current) use of anticoagulants: Secondary | ICD-10-CM | POA: Diagnosis not present

## 2016-07-10 ENCOUNTER — Encounter: Payer: Self-pay | Admitting: Allergy

## 2016-07-10 ENCOUNTER — Ambulatory Visit (INDEPENDENT_AMBULATORY_CARE_PROVIDER_SITE_OTHER): Payer: 59 | Admitting: Allergy

## 2016-07-10 VITALS — BP 124/70 | HR 80 | Resp 20 | Ht 67.5 in | Wt 160.6 lb

## 2016-07-10 DIAGNOSIS — Z880 Allergy status to penicillin: Secondary | ICD-10-CM

## 2016-07-10 DIAGNOSIS — T360X5D Adverse effect of penicillins, subsequent encounter: Secondary | ICD-10-CM

## 2016-07-10 DIAGNOSIS — L500 Allergic urticaria: Secondary | ICD-10-CM | POA: Diagnosis not present

## 2016-07-10 DIAGNOSIS — Z889 Allergy status to unspecified drugs, medicaments and biological substances status: Secondary | ICD-10-CM | POA: Diagnosis not present

## 2016-07-10 DIAGNOSIS — T50995D Adverse effect of other drugs, medicaments and biological substances, subsequent encounter: Secondary | ICD-10-CM

## 2016-07-10 NOTE — Progress Notes (Signed)
New Patient Note  RE: [name] MRN: [id number] DOB: 1956/01/08 Date of Office Visit: 07/10/2016  Referring provider: Michel Bickers, MD    Primary care provider: Annye Asa, MD  Chief Complaint: Penicillin allergy.  History of present illness: [name] is a 61 y.o. female presenting today for consultation for penicillin allergy evaluation. 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 She had a right total knee arthroplasty on 03/20/2016. She did well initially but she suffered a fall and had dehiscence of her wound. She had an I&D performed with closure of the wound on 04/12/2016. She was discharged on a course of Keflex. She had persistent wound drainage and was readmitted on 05/10/2016. She had some cellulitis around the wound and treated with IV vancomycin and aztreonam and was then transitioned to oral ciprofloxacin due to UTI. She continued to have persistent wound drainage and low-grade fevers and was readmitted on 05/28/2016 where she underwent another I&D and an poly-exchange surgery and wound VAC placed.  Her synovial fluid cultures have grown Enterococcus faecalis.  She reports that the ideal treatment of choice would be a penicillin however she has a listed penicillin allergy.  She got a rash when she was a kid with penicillin use and avoided up until she was about 61 years old when she took another penicillin antibiotic again. She again developed a rash.  She describes the rash as a prickly heat rash that was itchy. She denies any bruising or swelling and she did not have any respiratory, GI or CV related symptoms with this. She has avoided penicillins ever since.  She has mild seasonal allergies and she takes Zyrtec as needed to help with the symptoms. She has no history of asthma, eczema or food allergy..     Review of systems: Review of Systems  Constitutional: Positive for fever (associated with chemotherpay).  Negative for chills.  HENT: Negative for congestion, ear pain, nosebleeds, sinus pain and sore throat.   Eyes: Negative for discharge and redness.  Respiratory: Negative for cough, shortness of breath and wheezing.   Cardiovascular: Negative for chest pain.  Gastrointestinal: Negative for abdominal pain, heartburn, nausea and vomiting.  Musculoskeletal: Negative for joint pain and myalgias.  Skin: Negative for itching and rash.  Neurological: Negative for headaches.    All other systems negative unless noted above in HPI  Past medical history: Past Medical History:  Diagnosis Date  . 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 Arthritis KNEES   right knee, hx. past left knee replacemnt.  . Blood clot in vein    around Portacath right chest-tx. Eliquis about a yr., prior Lovenox.  . Blood dyscrasia   . Chronic anticoagulation HX  PORT-A-CATH CLOT   2010 - HAS BEEN ON LOVENOX SINCE THE CLOT  . Elevated blood pressure reading without diagnosis of hypertension    PT MONITORS HER B/P AT HOME  . Heart murmur   . 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 PONV (postoperative nausea and vomiting)   . Skin cancer of anterior chest BREAST CANCER PRIMARY W/ METS TO CHEST WALL SKIN CANCER--  CHEMOTHERAPY EVERY 3 WEEKS AT [facility]  . Status post skin flap graft    right chest wall with metastatis right anterior chest -no drainage or open wound.  Past surgical history: Past Surgical History:  Procedure Laterality Date  . CHEST WALL TUMOR EXCISION    . hemotoma     evacuation left chest wall  . I&D KNEE WITH POLY EXCHANGE Right 05/28/2016   Procedure: IRRIGATION AND DEBRIDEMENT KNEE WOUND VAC PLACMENT;  Surgeon: Susa Day, MD;  Location: [facility];  Service: Orthopedics;  Laterality: Right;  . I&D KNEE WITH POLY EXCHANGE Right 05/30/2016   Procedure: RADICAL  SYNOVECTOMY,IRRIGATION AND DEBRIDEMENT KNEE WITH POLY EXCHANGE WITH ANTIBIOTIC BEADS, APPLICATION OF WOUND VAC;  Surgeon: Susa Day, MD;  Location: [facility];  Service: Orthopedics;  Laterality: Right;  . IRRIGATION AND DEBRIDEMENT KNEE Right 04/12/2016   Procedure: IRRIGATION AND DEBRIDEMENT KNEE;  Surgeon: Nicholes Stairs, MD;  Location: [facility];  Service: Orthopedics;  Laterality: Right;  . KNEE ARTHROSCOPY  02/13/2012   Procedure: ARTHROSCOPY KNEE;  Surgeon: Johnn Hai, MD;  Location: [facility];  Service: Orthopedics;  Laterality: Left;  WITH DEBRIDEMENt   . KNEE ARTHROSCOPY WITH LATERAL MENISECTOMY  02/13/2012   Procedure: KNEE ARTHROSCOPY WITH LATERAL MENISECTOMY;  Surgeon: Johnn Hai, MD;  Location: [place];  Service: Orthopedics;;  partial  . LEFT MODIFIED RADICAL MASTECTOMY/ RIGHT TOTAL MASTECTOMY  11-13-2007   LEFT BREAST CANCER W/ AXILLARY LYMPH NODE METASTASIS AND POST NEOADJUVANT CHEMO  . PLACEMENT PORT-A-CATH  06/22/2007   right chest  . SKIN GRAFT     chest-post tumor removal, second occurrence of cancer" 2015 surgery for Flap 2015"  . TOTAL KNEE ARTHROPLASTY Left 09/23/2012   Procedure: LEFT TOTAL KNEE ARTHROPLASTY;  Surgeon: Johnn Hai, MD;  Location: [facility];  Service: Orthopedics;  Laterality: Left;  . TOTAL KNEE ARTHROPLASTY Right 03/20/2016   Procedure: RIGHT TOTAL KNEE ARTHROPLASTY;  Surgeon: Susa Day, MD;  Location: [facility];  Service: Orthopedics;  Laterality: Right;  . TRANSTHORACIC ECHOCARDIOGRAM  11-18-2011  DR BENSIMHON (ECHO EVERY 3 MONTHS)   HX CHEMO INDUCED CARDIOTOXICITY/  LVSF NORMAL/ EF 55-50%/ MILD MITRIAL VALVE REGURG./ MILDLY INCREASED SYSTOLIC PRESSURE OF PULMONARY ARTERIES  . VASCULAR SURGERY Right    portacath chest    Family history:  Family History  Problem Relation Age of Onset  . Heart disease Mother     due to mitral valve regurgiation,   . Cancer Mother     breast  . Allergic rhinitis  Mother   . Parkinsonism Father   . Allergic rhinitis Father   . Migraines Father   . Alcohol abuse Paternal Grandfather   . Angioedema Neg Hx   . Asthma Neg Hx   . Eczema Neg Hx     Social history: Social History   Social History  . Marital status: Married    Spouse name: N/A  . Number of children: 3  . Years of education: N/A   Occupational History  . 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      She lives home with carpeting with 2 dogs and 2 cats.  There is electric heating and central cooling.  There is no concern with water damage or mildew and roaches in the home.      Medication List: Allergies as of 07/10/2016      Reactions   Tape Hives   Can tolerate  paper tape   Other Rash   STERI STRIPS - Blisters   Penicillins Rash   Denies airway involvement      Medication List       Accurate as of 07/10/16  7:09 PM. Always use your most recent med list.          acetaminophen 325 MG tablet Commonly known as:  TYLENOL Take 2 tablets (650 mg total) by mouth every 6 (six) hours as needed for mild pain (or Fever >/= 101).   apixaban 5 MG Tabs tablet Commonly known as:  ELIQUIS Take 5 mg by mouth 2 (two) times daily. Will stop prior to proceure   celecoxib 200 MG capsule Commonly known as:  CELEBREX TAKE 1 CAPSULE BY MOUTH TWICE DAILY   darifenacin 15 MG 24 hr tablet Commonly known as:  ENABLEX Take 15 mg by mouth daily.   docusate sodium 100 MG capsule Commonly known as:  COLACE Take 1 capsule (100 mg total) by mouth 2 (two) times daily as needed for mild constipation.   gabapentin 300 MG capsule Commonly known as:  NEURONTIN TAKE 1 CAPSULE BY MOUTH 3 TIMES DAILY   KADCYLA IV Inject into the vein every 21 ( twenty-one) days.   ondansetron 8 MG tablet Commonly known as:  ZOFRAN Take 1 tablet  (8 mg total) by mouth every 8 (eight) hours as needed for nausea.   oxyCODONE 5 MG immediate release tablet Commonly known as:  Oxy IR/ROXICODONE Take 1 tablet (5 mg total) by mouth every 4 (four) hours as needed for breakthrough pain.   polyethylene glycol packet Commonly known as:  MIRALAX / GLYCOLAX Take 17 g by mouth daily.   traMADol 50 MG tablet Commonly known as:  ULTRAM TAKE 1 TABLET BY MOUTH EVERY 6-8 HOURS AS NEEDED FOR PAIN   vancomycin 1,250 mg in sodium chloride 0.9 % 250 mL Inject 1,250 mg into the vein every 12 (twelve) hours. Through port-a-cath. Duration 6 weeks.   Vitamin D3 2000 units Tabs Take 1 tablet by mouth 4 (four) times a week.   zolpidem 5 MG tablet Commonly known as:  AMBIEN TAKE 1 TABLET BY MOUTH AT BEDTIME AS NEEDED FOR SLEEP       Known medication allergies: Allergies  Allergen Reactions  . Tape Hives    Can tolerate paper tape  . Other Rash    STERI STRIPS - Blisters  . Penicillins Rash    Denies airway involvement      Physical examination: Blood pressure 124/70, pulse 80, resp. rate 20, height 5' 7.5" (1.715 m), weight 160 lb 9.6 oz (72.8 kg), last menstrual period 06/27/2007.  General: Alert, interactive, in no acute distress. HEENT: TMs pearly gray, turbinates non-edematous without discharge, post-pharynx non erythematous. Neck: Supple without lymphadenopathy. Lungs: Clear to auscultation without wheezing, rhonchi or rales. {no increased work of breathing. CV: Normal S1, S2 without murmurs. Abdomen: Nondistended, nontender. Skin: Warm and dry, without lesions or rashes. Port in place on R upper chest Extremities:  No clubbing, cyanosis or edema. Neuro:   Grossly intact.  Diagnositics/Labs: Allergy testing: PCN skin prick testing for PrePen and PCN G 5000u/ml were negative.   IDs for PrePEN and PCN G 5000u/ml were negative.  Histamine control was positive Allergy testing results were read and interpreted by provider, documented  by clinical staff.   Assessment and plan:   Penicillin Allergy     - She has had 10+ years of penicillin avoidance and thus this is in her  favor of likely no longer being allergic if she was truly allergic to begin with. She also has only had skin findings with penicillin use and no systemic reactions.    - skin testing today was negative and thus will proceed with an office oral challenge.  She will return tomorrow February 16 for in office challenge.  If she successfully passes she is no longer allergic and will be able to use penicillin-based antibiotic in the future for treatment of infections.    I appreciate the opportunity to take part in [facility] care. Please do not hesitate to contact me with questions.  Sincerely,   Prudy Feeler, MD Allergy/Immunology [facility] [place] [place]

## 2016-07-10 NOTE — Patient Instructions (Addendum)
Penicillin Allergy     - skin testing today was negative    - will have you come back tomorrow at 1:30 for PCN oral challenge

## 2016-07-11 ENCOUNTER — Ambulatory Visit (INDEPENDENT_AMBULATORY_CARE_PROVIDER_SITE_OTHER): Payer: 59 | Admitting: Allergy

## 2016-07-11 ENCOUNTER — Encounter: Payer: Self-pay | Admitting: Allergy

## 2016-07-11 VITALS — BP 108/64 | HR 92 | Resp 16

## 2016-07-11 DIAGNOSIS — Z889 Allergy status to unspecified drugs, medicaments and biological substances status: Secondary | ICD-10-CM | POA: Diagnosis not present

## 2016-07-11 DIAGNOSIS — Z880 Allergy status to penicillin: Secondary | ICD-10-CM

## 2016-07-11 DIAGNOSIS — T360X5D Adverse effect of penicillins, subsequent encounter: Secondary | ICD-10-CM

## 2016-07-11 NOTE — Progress Notes (Signed)
Follow-up Note  RE: [name] MRN: [id number] DOB: Jan 03, 1956 Date of Office Visit: 07/11/2016   History of present illness: [name] is a 61 y.o. female returns today for in-office oral challenge to PCN V K.  She had PCN skin testing performed yesterday in the office that was negative.  She reports being in her usual state of health with fever or any cold or flu-like symptoms.   She is off antihistamines for this challenge today.  Please refer to H&P from 07/10/16 for initial history and reaction.       Review of systems: Review of Systems  Constitutional: Positive for fever (after chemotherapy). Negative for chills and malaise/fatigue.  HENT: Negative for congestion, ear pain, nosebleeds, sinus pain and sore throat.   Eyes: Negative for discharge and redness.  Respiratory: Negative for cough, shortness of breath and wheezing.   Cardiovascular: Negative for chest pain.  Gastrointestinal: Negative for abdominal pain, heartburn, nausea and vomiting.  Musculoskeletal: Negative for joint pain and myalgias.  Skin: Negative for itching and rash.  Neurological: Negative for headaches.    All other systems negative unless noted above in HPI  Past medical/social/surgical/family history have been reviewed and are unchanged unless specifically indicated below.  No changes  Medication List: Allergies as of 07/11/2016      Reactions   Tape Hives   Can tolerate paper tape   Other Rash   STERI STRIPS - Blisters   Penicillins Rash   Denies airway involvement      Medication List       Accurate as of 07/11/16  3:39 PM. Always use your most recent med list.          acetaminophen 325 MG tablet Commonly known as:  TYLENOL Take 2 tablets (650 mg total) by mouth every 6 (six) hours as needed for mild pain (or Fever >/= 101).   apixaban 5 MG Tabs tablet Commonly known as:  ELIQUIS Take 5 mg by mouth 2 (two) times daily. Will stop prior to proceure   celecoxib 200 MG  capsule Commonly known as:  CELEBREX TAKE 1 CAPSULE BY MOUTH TWICE DAILY   darifenacin 15 MG 24 hr tablet Commonly known as:  ENABLEX Take 15 mg by mouth daily.   docusate sodium 100 MG capsule Commonly known as:  COLACE Take 1 capsule (100 mg total) by mouth 2 (two) times daily as needed for mild constipation.   gabapentin 300 MG capsule Commonly known as:  NEURONTIN TAKE 1 CAPSULE BY MOUTH 3 TIMES DAILY   KADCYLA IV Inject into the vein every 21 ( twenty-one) days.   ondansetron 8 MG tablet Commonly known as:  ZOFRAN Take 1 tablet (8 mg total) by mouth every 8 (eight) hours as needed for nausea.   oxyCODONE 5 MG immediate release tablet Commonly known as:  Oxy IR/ROXICODONE Take 1 tablet (5 mg total) by mouth every 4 (four) hours as needed for breakthrough pain.   polyethylene glycol packet Commonly known as:  MIRALAX / GLYCOLAX Take 17 g by mouth daily.   traMADol 50 MG tablet Commonly known as:  ULTRAM TAKE 1 TABLET BY MOUTH EVERY 6-8 HOURS AS NEEDED FOR PAIN   vancomycin 1,250 mg in sodium chloride 0.9 % 250 mL Inject 1,250 mg into the vein every 12 (twelve) hours. Through port-a-cath. Duration 6 weeks.   Vitamin D3 2000 units Tabs Take 1 tablet by mouth 4 (four) times a week.   zolpidem 5 MG tablet Commonly  known as:  AMBIEN TAKE 1 TABLET BY MOUTH AT BEDTIME AS NEEDED FOR SLEEP       Known medication allergies: Allergies  Allergen Reactions  . Tape Hives    Can tolerate paper tape  . Other Rash    STERI STRIPS - Blisters  . Penicillins Rash    Denies airway involvement      Physical examination: Blood pressure 108/64, pulse 92, resp. rate 16, last menstrual period 06/27/2007.  General: Alert, interactive, in no acute distress. HEENT: TMs pearly gray, turbinates minimally edematous without discharge, post-pharynx non erythematous. Neck: Supple without lymphadenopathy. Lungs: Clear to auscultation without wheezing, rhonchi or rales. {no increased  work of breathing. CV: Normal S1, S2 without murmurs. Abdomen: Nondistended, nontender. Skin: Warm and dry, without lesions or rashes. Port in place in upper right chest Extremities:  No clubbing, cyanosis or edema.  Dressing over right knee Neuro:   Grossly intact.  Diagnositics/Labs: Oral Challenge to penicillin VK performed today. Benefits and risks of this challenge discussed and verbal consent obtained. She received total dose 500 mg of penicillin VK given over 3 doses at 1%, 10% and 90% of total dose. She was observed 20 minutes between doses and observed for an additional hour following completion of ingestion challenge.   She did not develop any signs or symptoms suggestive of allergic reaction. She had normal vitals following completion.  Assessment and plan:   Drug allergy   - Penicillin challenge was successfully passed today. Thus she is deemed no longer allergic. She is free to use penicillin-based antibiotics in the future.  Will remove PCN allergy from allergy list.    - Discussed with patient that she still may develop a rash throughout the course of penicillin-based antibiotics that is typically itchy, red and splotchy however given negative testing and challenging this does not indicate an allergy and she should be able to treat through this with use of antihistamines.   - If she were to develop a rash that leaves bruising, painful, develops swelling, fever, joint pains/arthralgias she should discontinue and be evaluated.  This may indicated delayed type or severe drug rash (not IgE mediated).    Follow-up as needed  I appreciate the opportunity to take part in [facility] care. Please do not hesitate to contact me with questions.  Sincerely,   Prudy Feeler, MD Allergy/Immunology [facility] [place] [facility] [place]

## 2016-07-11 NOTE — Patient Instructions (Addendum)
Oral challenge to penicillin was performed today in the office.  Penicillin challenge was successfully passed today.  You are no longer allergic to penicillin and are free to use in the future for treatment of infections.  Challenge today assessed for immediate-type reactions that are seen in IgE mediated drug allergy.  You may still develop a rash with penicillin use throughout the course that is typically itchy, red, splotchy rash however this is not life-threatening rash and advised to treat through the rash with use of daily antihistamines like Zyrtec, Allegra or Xyzal.  If you develop a rash with bruising, pain, swelling these would be reasons to discontinue the medication and have a evaluation performed.  The symptoms would be related to delayed-type reactions are not due to immediate drug allergy.

## 2016-07-13 DIAGNOSIS — Z96659 Presence of unspecified artificial knee joint: Secondary | ICD-10-CM | POA: Diagnosis not present

## 2016-07-13 DIAGNOSIS — A4902 Methicillin resistant Staphylococcus aureus infection, unspecified site: Secondary | ICD-10-CM | POA: Diagnosis not present

## 2016-07-14 ENCOUNTER — Telehealth: Payer: Self-pay | Admitting: *Deleted

## 2016-07-14 DIAGNOSIS — T8131XA Disruption of external operation (surgical) wound, not elsewhere classified, initial encounter: Secondary | ICD-10-CM | POA: Diagnosis not present

## 2016-07-14 DIAGNOSIS — Z5181 Encounter for therapeutic drug level monitoring: Secondary | ICD-10-CM | POA: Diagnosis not present

## 2016-07-14 DIAGNOSIS — E063 Autoimmune thyroiditis: Secondary | ICD-10-CM | POA: Diagnosis not present

## 2016-07-14 DIAGNOSIS — Z7901 Long term (current) use of anticoagulants: Secondary | ICD-10-CM | POA: Diagnosis not present

## 2016-07-14 NOTE — Telephone Encounter (Signed)
Patient and her husband walked in to clinic to give an update from the allergist. Patient is no longer allergic to penicillin. Per her report, should she develop a rash while on the antibiotic, she has been advised it is ok to take an over the counter antihistamine (such as claritin, zyrtec, benadryl) or anything else that Dr Megan Salon is comfortable with.  She will also check with her oncologist.  Per patient, she has been released to return to work by her PCP as long as her port is deaccessed and she is off vancomycin. Please advise.  Landis Gandy, RN

## 2016-07-15 ENCOUNTER — Other Ambulatory Visit: Payer: Self-pay | Admitting: Internal Medicine

## 2016-07-15 ENCOUNTER — Telehealth: Payer: Self-pay | Admitting: Pharmacist

## 2016-07-15 ENCOUNTER — Encounter: Payer: Self-pay | Admitting: Internal Medicine

## 2016-07-15 ENCOUNTER — Telehealth: Payer: Self-pay | Admitting: Internal Medicine

## 2016-07-15 DIAGNOSIS — Z96659 Presence of unspecified artificial knee joint: Secondary | ICD-10-CM | POA: Diagnosis not present

## 2016-07-15 DIAGNOSIS — A4902 Methicillin resistant Staphylococcus aureus infection, unspecified site: Secondary | ICD-10-CM | POA: Diagnosis not present

## 2016-07-15 DIAGNOSIS — T8453XD Infection and inflammatory reaction due to internal right knee prosthesis, subsequent encounter: Secondary | ICD-10-CM

## 2016-07-15 MED ORDER — AMOXICILLIN 500 MG PO CAPS: 500.0000 mg | ORAL_CAPSULE | Freq: Two times a day (BID) | ORAL | 11 refills | Status: DC

## 2016-07-15 MED FILL — AMOXICILLIN 500 MG CAPSULE: 500 | 30 days supply | Qty: 60 | Fill #0

## 2016-07-15 NOTE — Telephone Encounter (Signed)
Spoke to Clyde from [facility] and gave verbal order to stop [place]'s vancomycin per Dr. Megan Salon.

## 2016-07-15 NOTE — Telephone Encounter (Signed)
Shaylar Charmian Muff, MD  Michel Bickers, MD        Dr. Megan Salon,    I saw your patient, [name], for evaluation of PCN allergy.  She underwent skin testing which was negative and completed in-office oral challenge to PCN VK 500mg  total dose today successfully. Thus she should be able to tolerate PCNs going forward and I have removed PCN allergy from her allergy list.    thanks,  Prudy Feeler, MD    I will stop IV vancomycin now and start [name] on amoxicillin 500 mg twice a day.

## 2016-07-17 DIAGNOSIS — Z5181 Encounter for therapeutic drug level monitoring: Secondary | ICD-10-CM | POA: Diagnosis not present

## 2016-07-17 DIAGNOSIS — T8131XA Disruption of external operation (surgical) wound, not elsewhere classified, initial encounter: Secondary | ICD-10-CM | POA: Diagnosis not present

## 2016-07-17 DIAGNOSIS — Z7901 Long term (current) use of anticoagulants: Secondary | ICD-10-CM | POA: Diagnosis not present

## 2016-07-20 MED FILL — ELIQUIS 5 MG TABLET: 5 | 90 days supply | Qty: 180 | Fill #0

## 2016-07-20 MED FILL — DARIFENACIN ER 15 MG TABLET: 15 | 30 days supply | Qty: 30 | Fill #7

## 2016-07-20 MED FILL — ONDANSETRON HCL 8 MG TABLET: 8 | 7 days supply | Qty: 20 | Fill #1

## 2016-07-21 DIAGNOSIS — C50412 Malignant neoplasm of upper-outer quadrant of left female breast: Secondary | ICD-10-CM | POA: Diagnosis not present

## 2016-07-21 DIAGNOSIS — Z5112 Encounter for antineoplastic immunotherapy: Secondary | ICD-10-CM | POA: Diagnosis not present

## 2016-07-21 DIAGNOSIS — C792 Secondary malignant neoplasm of skin: Secondary | ICD-10-CM | POA: Diagnosis not present

## 2016-07-24 ENCOUNTER — Ambulatory Visit (INDEPENDENT_AMBULATORY_CARE_PROVIDER_SITE_OTHER): Payer: 59 | Admitting: Infectious Disease

## 2016-07-24 ENCOUNTER — Encounter: Payer: Self-pay | Admitting: Infectious Disease

## 2016-07-24 ENCOUNTER — Ambulatory Visit (INDEPENDENT_AMBULATORY_CARE_PROVIDER_SITE_OTHER): Payer: 59 | Admitting: Family Medicine

## 2016-07-24 ENCOUNTER — Encounter: Payer: Self-pay | Admitting: Allergy

## 2016-07-24 ENCOUNTER — Encounter: Payer: Self-pay | Admitting: Family Medicine

## 2016-07-24 VITALS — BP 110/68 | HR 109 | Temp 99.1°F | Resp 17 | Ht 68.0 in | Wt 163.1 lb

## 2016-07-24 VITALS — BP 145/83 | HR 120 | Temp 99.0°F | Ht 67.0 in | Wt 163.0 lb

## 2016-07-24 DIAGNOSIS — T7840XA Allergy, unspecified, initial encounter: Secondary | ICD-10-CM | POA: Diagnosis not present

## 2016-07-24 DIAGNOSIS — T8453XD Infection and inflammatory reaction due to internal right knee prosthesis, subsequent encounter: Secondary | ICD-10-CM

## 2016-07-24 DIAGNOSIS — Z7901 Long term (current) use of anticoagulants: Secondary | ICD-10-CM | POA: Diagnosis not present

## 2016-07-24 DIAGNOSIS — L270 Generalized skin eruption due to drugs and medicaments taken internally: Secondary | ICD-10-CM | POA: Diagnosis not present

## 2016-07-24 DIAGNOSIS — Z96651 Presence of right artificial knee joint: Secondary | ICD-10-CM | POA: Diagnosis not present

## 2016-07-24 DIAGNOSIS — T8131XD Disruption of external operation (surgical) wound, not elsewhere classified, subsequent encounter: Secondary | ICD-10-CM | POA: Diagnosis not present

## 2016-07-24 DIAGNOSIS — T8189XD Other complications of procedures, not elsewhere classified, subsequent encounter: Secondary | ICD-10-CM | POA: Diagnosis not present

## 2016-07-24 LAB — CBC WITH DIFFERENTIAL/PLATELET
BASOS ABS: 0 {cells}/uL (ref 0–200)
BASOS PCT: 0 %
EOS ABS: 371 {cells}/uL (ref 15–500)
Eosinophils Relative: 7 %
HCT: 31.1 % — ABNORMAL LOW (ref 35.0–45.0)
Hemoglobin: 10 g/dL — ABNORMAL LOW (ref 11.7–15.5)
LYMPHS PCT: 9 %
Lymphs Abs: 477 cells/uL — ABNORMAL LOW (ref 850–3900)
MCH: 29.7 pg (ref 27.0–33.0)
MCHC: 32.2 g/dL (ref 32.0–36.0)
MCV: 92.3 fL (ref 80.0–100.0)
MONO ABS: 583 {cells}/uL (ref 200–950)
MPV: 9.2 fL (ref 7.5–12.5)
Monocytes Relative: 11 %
NEUTROS PCT: 73 %
Neutro Abs: 3869 cells/uL (ref 1500–7800)
Platelets: 182 10*3/uL (ref 140–400)
RBC: 3.37 MIL/uL — ABNORMAL LOW (ref 3.80–5.10)
RDW: 16.7 % — AB (ref 11.0–15.0)
WBC: 5.3 10*3/uL (ref 3.8–10.8)

## 2016-07-24 LAB — COMPLETE METABOLIC PANEL WITH GFR
ALT: 30 U/L — AB (ref 6–29)
AST: 67 U/L — ABNORMAL HIGH (ref 10–35)
Albumin: 3.1 g/dL — ABNORMAL LOW (ref 3.6–5.1)
Alkaline Phosphatase: 101 U/L (ref 33–130)
BILIRUBIN TOTAL: 0.9 mg/dL (ref 0.2–1.2)
BUN: 17 mg/dL (ref 7–25)
CHLORIDE: 102 mmol/L (ref 98–110)
CO2: 26 mmol/L (ref 20–31)
CREATININE: 1 mg/dL — AB (ref 0.50–0.99)
Calcium: 8.5 mg/dL — ABNORMAL LOW (ref 8.6–10.4)
GFR, EST AFRICAN AMERICAN: 71 mL/min (ref >=60)
GFR, Est Non African American: 61 mL/min (ref >=60)
Glucose, Bld: 100 mg/dL — ABNORMAL HIGH (ref 65–99)
Potassium: 3.8 mmol/L (ref 3.5–5.3)
Sodium: 138 mmol/L (ref 135–146)
Total Protein: 6.5 g/dL (ref 6.1–8.1)

## 2016-07-24 MED ORDER — LINEZOLID 600 MG PO TABS: 600.0000 mg | ORAL_TABLET | Freq: Two times a day (BID) | ORAL | 3 refills | Status: DC

## 2016-07-24 MED ORDER — TRIAMCINOLONE ACETONIDE 0.1 % EX OINT: 1.0000 "application " | TOPICAL_OINTMENT | Freq: Two times a day (BID) | CUTANEOUS | 1 refills | Status: DC

## 2016-07-24 MED FILL — LINEZOLID 600 MG TABLET: 600 | 30 days supply | Qty: 60 | Fill #0

## 2016-07-24 MED FILL — TRIAMCINOLONE 0.1% OINTMENT: 0.1 | 30 days supply | Qty: 454 | Fill #0

## 2016-07-24 NOTE — Progress Notes (Signed)
   Subjective:    Patient ID: [name], female    DOB: Mar 29, 1956, 61 y.o.   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  Only oral medication that is appropriate was PCN and pt is allergic.  Had allergy testing and was told she could take meds.  Has been taking Zyrtec and benadryl which was fine until yesterday when she broke out in hives and drug rash.  Dr Megan Salon (ID) is out on vacation x2 weeks.  Pt has had a fever daily while on abx.    Review of Systems For ROS see HPI     Objective:   Physical Exam  Constitutional: She is oriented to person, place, and time. She appears well-developed and well-nourished. No distress.  HENT:  Head: Normocephalic and atraumatic.  Neurological: She is alert and oriented to person, place, and time.  Skin: Skin is warm and dry. Rash (confluent macular papular rash on back, chest, legs consistent w/ drug rash.  + scattered hives on arms) noted. There is erythema.  Psychiatric: She has a normal mood and affect. Her behavior is normal. Thought content normal.  Vitals reviewed.         Assessment & Plan:  Allergic drug rash- pt has again broken out in rash after taking Amoxicillin.  She was told that due to her infected R TKR that she would need to be on Amox indefinitely since the sensitivities were limited to Amox and Vanc.  Pt is clearly not able to take this medication but due to the many layers of her medical issues and very complicated situation, will defer abx changes to ID- which we will attempt to get her seen today.  I am very hesitant to start prednisone on pt given ongoing fevers and current infxn.  Start Triamcinolone for itchy rash.  Continue daily antihistamine and add benadryl prn.  Reviewed supportive care and red flags that should prompt return.  Pt expressed understanding and is in agreement w/ plan.

## 2016-07-24 NOTE — Progress Notes (Signed)
Subjective:   Chief complaint onset of diffuse rash on thighs arms chest after starting amoxicillin   Patient ID: [name], female    DOB: 07-Nov-1955, 61 y.o.   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  She did well initially but suffered a fall and had dehiscence of her wound. She underwent incision and drainage and closure of the wound on 04/12/2016. She was discharged on a short course of cephalexin. She had persistent wound drainage and was readmitted on 05/10/2016. She had some cellulitis around the wound. She was treated with IV vancomycin and aztreonam then transitioned to oral ciprofloxacin for presumed UTI although she did not have any symptoms of a UTI. She took the ciprofloxacin for 7 days. She had persistent wound drainage and low-grade fevers leading to readmission on 05/28/2016. She underwent incision and drainage and poly-exchange On 05/28/2016 by Dr. Theador Hawthorne had a VAC wound dressing placed. Synovial fluid cultures have grown Enterococcus faecalis.  She was seen by my partner Dr. Megan Salon who was concerned about long-term options for suppressive therapy in this patient given her amoxicillin allergy and given concerns about toxicity of Zyvox.  He referred her to allergy and immunology determined the patient did not have allergies to penicillins. Patient was started on amoxicillin but within a few days she has developed a diffuse rash on her legs and abdomen and arms. It is intensely pruritic. She also has fevers up to 102 although she says she has had them for several months.  She had a recent CBC with her hematologist who gave her chemotherapy within the last 10 days. It was prior to initiation of chemotherapy had normalized.  She had been seen by her current care  physician to call us to have the patient urgently worked into our clinic.  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  I've asked her to stay off the amoxicillin which she did take a dose this morning until this rash has cleared up I would like to change her over to Zyvox.  I do agree with Dr. Megan Salon however that Zyvox is not a viable long-term option. There are high risks ofbone marrow toxicity with Zyvox alone and certainly in the context of her receiving ongoing chemotherapy. There is risk for peripheral neuropathy and optic neuritis as well. Her organism was resistant to doxycycline. Fluoroquinolones are not reliable options.  Therefore think it is imperative that she be seen by her orthopedic surgeon for set up for a 2 staged revision with removal of her prosthesis and implantation of an antibiotic spacer followed by 6 weeks of parenteral then vancomycin and then careful consideration before reimplanting a new prosthesis.  Another option would be to try to desensitize her to penicillins but I don't think this is going to be a very workable option I think at 2 staged procedure is what is called for. I also think her chances of success with chronic oral suppressive therapy are low in the context of ongoing chemotherapy and an open wound even if we DID have a viable long term oral option which we DO NOT.  Past Medical History:  Diagnosis Date  . 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 Allergic reaction 07/24/2016  . Arthritis KNEES   right knee, hx. past  left knee replacemnt.  . Blood clot in vein    around Portacath right chest-tx. Eliquis about a yr., prior Lovenox.  . Blood dyscrasia   . Chronic anticoagulation HX  PORT-A-CATH CLOT   2010 - HAS BEEN ON LOVENOX SINCE THE CLOT  . Drug rash 07/24/2016  . Elevated blood pressure reading without diagnosis of hypertension    PT MONITORS HER B/P AT HOME  . Heart murmur   . 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 PONV (postoperative nausea and vomiting)   . Skin cancer of anterior chest BREAST CANCER PRIMARY W/ METS TO CHEST WALL SKIN CANCER--  CHEMOTHERAPY EVERY 3 WEEKS AT [facility]  . Status post skin flap graft    right chest wall with metastatis right anterior chest -no drainage or open wound.    Past Surgical History:  Procedure Laterality Date  . CHEST WALL TUMOR EXCISION    . hemotoma     evacuation left chest wall  . I&D KNEE WITH POLY EXCHANGE Right 05/28/2016   Procedure: IRRIGATION AND DEBRIDEMENT KNEE WOUND VAC PLACMENT;  Surgeon: Susa Day, MD;  Location: [facility];  Service: Orthopedics;  Laterality: Right;  . I&D KNEE WITH POLY EXCHANGE Right 05/30/2016   Procedure: RADICAL SYNOVECTOMY,IRRIGATION AND DEBRIDEMENT KNEE WITH POLY EXCHANGE WITH ANTIBIOTIC BEADS, APPLICATION OF WOUND VAC;  Surgeon: Susa Day, MD;  Location: [facility];  Service: Orthopedics;  Laterality: Right;  . IRRIGATION AND DEBRIDEMENT KNEE Right 04/12/2016   Procedure: IRRIGATION AND DEBRIDEMENT KNEE;  Surgeon: Nicholes Stairs, MD;  Location: [facility];  Service: Orthopedics;  Laterality: Right;  . KNEE ARTHROSCOPY  02/13/2012   Procedure: ARTHROSCOPY KNEE;  Surgeon: Johnn Hai, MD;  Location: [facility];  Service: Orthopedics;  Laterality: Left;  WITH DEBRIDEMENt   . KNEE ARTHROSCOPY WITH LATERAL MENISECTOMY  02/13/2012   Procedure: KNEE ARTHROSCOPY WITH LATERAL MENISECTOMY;  Surgeon: Johnn Hai, MD;  Location: [place];  Service: Orthopedics;;  partial  . LEFT MODIFIED RADICAL MASTECTOMY/ RIGHT TOTAL MASTECTOMY  11-13-2007   LEFT BREAST CANCER W/ AXILLARY LYMPH NODE METASTASIS AND POST NEOADJUVANT CHEMO  . PLACEMENT PORT-A-CATH  06/22/2007   right chest  . SKIN GRAFT     chest-post tumor removal, second occurrence of cancer" 2015 surgery for Flap 2015"    . TOTAL KNEE ARTHROPLASTY Left 09/23/2012   Procedure: LEFT TOTAL KNEE ARTHROPLASTY;  Surgeon: Johnn Hai, MD;  [facility];  Service: Orthopedics;  Laterality: Left;  . TOTAL KNEE ARTHROPLASTY Right 03/20/2016   Procedure: RIGHT TOTAL KNEE ARTHROPLASTY;  Surgeon: Susa Day, MD;  [facility];  Service: Orthopedics;  Laterality: Right;  . TRANSTHORACIC ECHOCARDIOGRAM  11-18-2011  DR BENSIMHON (ECHO EVERY 3 MONTHS)   HX CHEMO INDUCED CARDIOTOXICITY/  LVSF NORMAL/ EF 55-50%/ MILD MITRIAL VALVE REGURG./ MILDLY INCREASED SYSTOLIC PRESSURE OF PULMONARY ARTERIES  . VASCULAR SURGERY Right    portacath chest    Family History  Problem Relation Age of Onset  . Heart disease Mother     due to mitral valve regurgiation,   . Cancer Mother     breast  . Allergic rhinitis Mother   . Parkinsonism Father   . Allergic rhinitis Father   . Migraines Father   . Alcohol abuse Paternal Grandfather   . Angioedema Neg Hx   .  Asthma Neg Hx   . Eczema Neg Hx       Social History   Social History  . Marital status: Married    Spouse name: N/A  . Number of children: 3  . Years of education: N/A   Occupational History  . 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 Smoking status: Never Smoker  . Smokeless tobacco: Never Used  . Alcohol use No  . Drug use: No  . Sexual activity: Not Asked   Other Topics Concern  . 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          Allergies  Allergen Reactions  . Penicillins Hives and Rash    Denies airway involvement   . Tape Hives    Can tolerate paper tape  . Other Rash    STERI STRIPS - Blisters     Current Outpatient Prescriptions:  .  acetaminophen (TYLENOL) 325 MG tablet, Take 2 tablets (650 mg total) by mouth every 6 (six) hours as needed for mild pain (or Fever >/= 101).,  Disp: 30 tablet, Rfl: 0 .  Ado-Trastuzumab Emtansine (KADCYLA IV), Inject into the vein every 21 ( twenty-one) days., Disp: , Rfl:  .  apixaban (ELIQUIS) 5 MG TABS tablet, Take 5 mg by mouth 2 (two) times daily. Will stop prior to proceure, Disp: , Rfl:  .  Cholecalciferol (VITAMIN D3) 2000 UNITS TABS, Take 1 tablet by mouth 4 (four) times a week. , Disp: , Rfl:  .  darifenacin (ENABLEX) 15 MG 24 hr tablet, Take 15 mg by mouth daily., Disp: , Rfl:  .  docusate sodium (COLACE) 100 MG capsule, Take 1 capsule (100 mg total) by mouth 2 (two) times daily as needed for mild constipation., Disp: 30 capsule, Rfl: 1 .  gabapentin (NEURONTIN) 300 MG capsule, TAKE 1 CAPSULE BY MOUTH 3 TIMES DAILY, Disp: 90 capsule, Rfl: 6 .  ondansetron (ZOFRAN) 8 MG tablet, Take 1 tablet (8 mg total) by mouth every 8 (eight) hours as needed for nausea., Disp: 20 tablet, Rfl: 6 .  polyethylene glycol (MIRALAX / GLYCOLAX) packet, Take 17 g by mouth daily. (Patient taking differently: Take 17 g by mouth daily as needed for mild constipation. ), Disp: 14 each, Rfl: 0 .  zolpidem (AMBIEN) 5 MG tablet, TAKE 1 TABLET BY MOUTH AT BEDTIME AS NEEDED FOR SLEEP, Disp: 30 tablet, Rfl: PRN .  celecoxib (CELEBREX) 200 MG capsule, TAKE 1 CAPSULE BY MOUTH TWICE DAILY (Patient not taking: Reported on 07/24/2016), Disp: 60 capsule, Rfl: PRN .  linezolid (ZYVOX) 600 MG tablet, Take 1 tablet (600 mg total) by mouth 2 (two) times daily., Disp: 60 tablet, Rfl: 3 .  oxyCODONE (OXY IR/ROXICODONE) 5 MG immediate release tablet, Take 1 tablet (5 mg total) by mouth every 4 (four) hours as needed for breakthrough pain. (Patient not taking: Reported on 07/24/2016), Disp: 40 tablet, Rfl: 0 .  traMADol (ULTRAM) 50 MG tablet, TAKE 1 TABLET BY MOUTH EVERY 6-8 HOURS AS NEEDED FOR PAIN, Disp: , Rfl: 0 .  triamcinolone ointment (KENALOG) 0.1 %, Apply 1 application topically 2 (two) times daily. (Patient not taking: Reported on 07/24/2016), Disp: 454 g, Rfl:  1    Review of Systems  Constitutional: Positive for fatigue and fever.  Negative for chills.  HENT: Negative for congestion and sore throat.   Eyes: Negative for photophobia.  Respiratory: Negative for cough, shortness of breath and wheezing.   Cardiovascular: Negative for chest pain, palpitations and leg swelling.  Gastrointestinal: Negative for abdominal pain, blood in stool, constipation, diarrhea, nausea and vomiting.  Genitourinary: Negative for dysuria, flank pain and hematuria.  Musculoskeletal: Negative for back pain and myalgias.  Skin: Positive for rash.  Neurological: Negative for dizziness, weakness and headaches.  Hematological: Does not bruise/bleed easily.  Psychiatric/Behavioral: Negative for suicidal ideas.       Objective:   Physical Exam  Constitutional: She is oriented to person, place, and time. She appears well-developed and well-nourished. No distress.  HENT:  Head: Normocephalic and atraumatic.  Mouth/Throat: No oropharyngeal exudate.  Eyes: Conjunctivae and EOM are normal. No scleral icterus.  Neck: Normal range of motion. Neck supple.  Cardiovascular: Normal rate and regular rhythm.   Pulmonary/Chest: Effort normal. No respiratory distress. She has no wheezes.  Abdominal: She exhibits no distension.  Musculoskeletal: She exhibits no edema or tenderness.  Neurological: She is alert and oriented to person, place, and time. She exhibits normal muscle tone. Coordination normal.  Skin: Skin is warm and dry. Rash noted. She is not diaphoretic. There is erythema. No pallor.  Psychiatric: She has a normal mood and affect. Her behavior is normal. Judgment and thought content normal.   She has skin changes from her metastatic breast cancer on her chest and also radiation changes in her neck. She has a discrete rash that she has shown me below that started after initiation of amoxicillin.  Rash:  07/25/16:       Left PJ site with open wound:          Assessment & Plan:   Allergic reaction: This is assuredly due to the penicillin. Her chemotherapy regimen has been the same for months and she has tolerated quite well she did have childhood allergic reactions to penicillin as described above. We'll have her stop her medicines and we will check labs today.  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 Amoxicillin is not a good viable option since she is allergic to it I don't think he would be simple to desensitize her to this antibiotic either. Isolate is resistant to doxycycline and fluoroquinolones are not reliable against this organism for now I will have her start on oral Zyvox by Monday at the latest giving her some time to have the rash calmed down so we do not confuse her current allergic reaction with new antibiotic.  She will need a repeat CBC done within the next 2 weeks and she will have one done with her [facility] oncologist.  She is to see Dr. Megan Salon in early April  The most pressing issue however is that she needs to see her orthopedic surgeon Dr. Maxie Better I think she needs to undergo a 2 staged procedure with removal of her prosthesis first followed by at least 6 weeks of parenteral antibiotics in the form of vancomycin then followed by careful waiting prior to reimplantation. Her aunt coagulation will also need to be managed around this.  I spent greater than 40 minutes with the patient including greater than 50% of time in face to face counsel of the patient Regarding her allergic reaction or personally joint infection her hypercoagulable state her breast cancer and in coordination of Her care With pharmacy and primary care.

## 2016-07-24 NOTE — Patient Instructions (Signed)
Follow up as needed/scheduled Start the Triamcinolone ointment twice daily Continue the Zyrtec and Benadryl We'll call you with your ID appt Call with any questions or concerns Hang in there!!

## 2016-07-24 NOTE — Progress Notes (Signed)
Pre visit review using our clinic review tool, if applicable. No additional management support is needed unless otherwise documented below in the visit note. 

## 2016-07-25 LAB — C-REACTIVE PROTEIN: CRP: 88.1 mg/L — ABNORMAL HIGH (ref <8.0)

## 2016-07-25 LAB — SEDIMENTATION RATE: Sed Rate: 76 mm/hr — ABNORMAL HIGH (ref 0–30)

## 2016-07-28 ENCOUNTER — Telehealth: Payer: Self-pay | Admitting: Family Medicine

## 2016-07-28 ENCOUNTER — Other Ambulatory Visit (INDEPENDENT_AMBULATORY_CARE_PROVIDER_SITE_OTHER): Payer: 59

## 2016-07-28 DIAGNOSIS — Z96651 Presence of right artificial knee joint: Secondary | ICD-10-CM | POA: Diagnosis not present

## 2016-07-28 DIAGNOSIS — R770 Abnormality of albumin: Secondary | ICD-10-CM | POA: Diagnosis not present

## 2016-07-28 DIAGNOSIS — Z96652 Presence of left artificial knee joint: Secondary | ICD-10-CM | POA: Diagnosis not present

## 2016-07-28 DIAGNOSIS — Z7901 Long term (current) use of anticoagulants: Secondary | ICD-10-CM | POA: Diagnosis not present

## 2016-07-28 DIAGNOSIS — T8189XA Other complications of procedures, not elsewhere classified, initial encounter: Secondary | ICD-10-CM | POA: Diagnosis not present

## 2016-07-28 DIAGNOSIS — S81001A Unspecified open wound, right knee, initial encounter: Secondary | ICD-10-CM | POA: Diagnosis not present

## 2016-07-28 DIAGNOSIS — Z853 Personal history of malignant neoplasm of breast: Secondary | ICD-10-CM | POA: Diagnosis not present

## 2016-07-28 NOTE — Telephone Encounter (Signed)
Ok for pre-albumin.  Dx low albumin

## 2016-07-28 NOTE — Telephone Encounter (Signed)
Patient states Dr. Audelia Hives has placed an order for her to have lab work to check her prealbumin level.  Patient wants to know if she can come to pcp office to have lab work done.

## 2016-07-28 NOTE — Telephone Encounter (Signed)
Please advise, I do not see any orders in pt's chart

## 2016-07-28 NOTE — Telephone Encounter (Signed)
Patient scheduled today at 4:15.

## 2016-07-28 NOTE — Telephone Encounter (Signed)
Noted, labs were ordered. Ok to schedule pt for labs

## 2016-07-30 ENCOUNTER — Ambulatory Visit: Payer: Self-pay | Admitting: Physician Assistant

## 2016-07-30 ENCOUNTER — Encounter (HOSPITAL_COMMUNITY): Payer: Self-pay | Admitting: *Deleted

## 2016-07-30 LAB — PREALBUMIN: PREALBUMIN: 16 mg/dL — AB (ref 17–34)

## 2016-07-30 NOTE — Progress Notes (Signed)
Pt denies SOB and chest pain but is under the care of Dr. Haroldine Laws, Cardiology. Pt denies having a stress test and cardiac cath. Pt stated that her last dose of Eliquis was " yesterday morning." Pt made aware to stop taking  vitamins, fish oil and herbal medications. Do not take any NSAIDs ie: Ibuprofen, Advil, Naproxen, BC and Goody Powder or any medication containing Aspirin. Pt verbalized understanding of all pre-op instructions.

## 2016-07-31 ENCOUNTER — Encounter (HOSPITAL_COMMUNITY): Payer: Self-pay | Admitting: Plastic Surgery

## 2016-07-31 ENCOUNTER — Ambulatory Visit: Payer: Self-pay | Admitting: Plastic Surgery

## 2016-07-31 ENCOUNTER — Encounter: Payer: Self-pay | Admitting: Family Medicine

## 2016-07-31 ENCOUNTER — Ambulatory Visit (HOSPITAL_COMMUNITY): Payer: 59 | Admitting: Certified Registered Nurse Anesthetist

## 2016-07-31 ENCOUNTER — Ambulatory Visit (INDEPENDENT_AMBULATORY_CARE_PROVIDER_SITE_OTHER): Payer: 59 | Admitting: Family Medicine

## 2016-07-31 ENCOUNTER — Ambulatory Visit (HOSPITAL_COMMUNITY)
Admission: RE | Admit: 2016-07-31 | Discharge: 2016-07-31 | Disposition: A | Discharge status: Home / Self Care | Payer: 59 | Source: Ambulatory Visit | Attending: Plastic Surgery | Admitting: Plastic Surgery

## 2016-07-31 ENCOUNTER — Encounter (HOSPITAL_COMMUNITY)
Admission: RE | Disposition: A | Discharge status: Home / Self Care | Payer: Self-pay | Source: Ambulatory Visit | Attending: Plastic Surgery

## 2016-07-31 DIAGNOSIS — Z803 Family history of malignant neoplasm of breast: Secondary | ICD-10-CM | POA: Insufficient documentation

## 2016-07-31 DIAGNOSIS — Z8489 Family history of other specified conditions: Secondary | ICD-10-CM | POA: Diagnosis not present

## 2016-07-31 DIAGNOSIS — Z853 Personal history of malignant neoplasm of breast: Secondary | ICD-10-CM | POA: Diagnosis not present

## 2016-07-31 DIAGNOSIS — Z85828 Personal history of other malignant neoplasm of skin: Secondary | ICD-10-CM | POA: Insufficient documentation

## 2016-07-31 DIAGNOSIS — R011 Cardiac murmur, unspecified: Secondary | ICD-10-CM | POA: Insufficient documentation

## 2016-07-31 DIAGNOSIS — Z880 Allergy status to penicillin: Secondary | ICD-10-CM | POA: Diagnosis not present

## 2016-07-31 DIAGNOSIS — S81001A Unspecified open wound, right knee, initial encounter: Secondary | ICD-10-CM | POA: Diagnosis not present

## 2016-07-31 DIAGNOSIS — Z0000 Encounter for general adult medical examination without abnormal findings: Secondary | ICD-10-CM

## 2016-07-31 DIAGNOSIS — Z888 Allergy status to other drugs, medicaments and biological substances status: Secondary | ICD-10-CM | POA: Diagnosis not present

## 2016-07-31 DIAGNOSIS — Z96651 Presence of right artificial knee joint: Secondary | ICD-10-CM | POA: Diagnosis not present

## 2016-07-31 DIAGNOSIS — Z9221 Personal history of antineoplastic chemotherapy: Secondary | ICD-10-CM | POA: Diagnosis not present

## 2016-07-31 DIAGNOSIS — D649 Anemia, unspecified: Secondary | ICD-10-CM | POA: Diagnosis not present

## 2016-07-31 DIAGNOSIS — W19XXXD Unspecified fall, subsequent encounter: Secondary | ICD-10-CM | POA: Insufficient documentation

## 2016-07-31 DIAGNOSIS — Z820 Family history of epilepsy and other diseases of the nervous system: Secondary | ICD-10-CM | POA: Diagnosis not present

## 2016-07-31 DIAGNOSIS — Z811 Family history of alcohol abuse and dependence: Secondary | ICD-10-CM | POA: Insufficient documentation

## 2016-07-31 DIAGNOSIS — Z91048 Other nonmedicinal substance allergy status: Secondary | ICD-10-CM | POA: Diagnosis not present

## 2016-07-31 DIAGNOSIS — T8132XA Disruption of internal operation (surgical) wound, not elsewhere classified, initial encounter: Secondary | ICD-10-CM | POA: Diagnosis not present

## 2016-07-31 DIAGNOSIS — Z9011 Acquired absence of right breast and nipple: Secondary | ICD-10-CM | POA: Diagnosis not present

## 2016-07-31 DIAGNOSIS — Z8249 Family history of ischemic heart disease and other diseases of the circulatory system: Secondary | ICD-10-CM | POA: Diagnosis not present

## 2016-07-31 DIAGNOSIS — S81001D Unspecified open wound, right knee, subsequent encounter: Secondary | ICD-10-CM | POA: Insufficient documentation

## 2016-07-31 DIAGNOSIS — T814XXA Infection following a procedure, initial encounter: Secondary | ICD-10-CM | POA: Diagnosis not present

## 2016-07-31 DIAGNOSIS — R030 Elevated blood-pressure reading, without diagnosis of hypertension: Secondary | ICD-10-CM | POA: Insufficient documentation

## 2016-07-31 DIAGNOSIS — M1711 Unilateral primary osteoarthritis, right knee: Secondary | ICD-10-CM | POA: Diagnosis not present

## 2016-07-31 HISTORY — PX: APPLICATION OF A-CELL OF BACK: SHX6301

## 2016-07-31 HISTORY — PX: INCISION AND DRAINAGE OF WOUND: SHX1803

## 2016-07-31 LAB — BASIC METABOLIC PANEL
ANION GAP: 8 (ref 5–15)
BUN: 18 mg/dL (ref 6–23)
BUN: 15 mg/dL (ref 6–20)
CHLORIDE: 101 meq/L (ref 96–112)
CO2: 32 mEq/L (ref 19–32)
CO2: 27 mmol/L (ref 22–32)
CREATININE: 0.83 mg/dL (ref 0.40–1.20)
Calcium: 9.4 mg/dL (ref 8.4–10.5)
Calcium: 9 mg/dL (ref 8.9–10.3)
Chloride: 101 mmol/L (ref 101–111)
Creatinine, Ser: 0.82 mg/dL (ref 0.44–1.00)
GFR: 74.48 mL/min (ref >60.00)
Glucose, Bld: 91 mg/dL (ref 70–99)
Glucose, Bld: 89 mg/dL (ref 65–99)
POTASSIUM: 3.5 mmol/L (ref 3.5–5.1)
Potassium: 3.9 mEq/L (ref 3.5–5.1)
Sodium: 138 mEq/L (ref 135–145)
Sodium: 136 mmol/L (ref 135–145)

## 2016-07-31 LAB — CBC
HCT: 31.5 % — ABNORMAL LOW (ref 36.0–46.0)
HEMOGLOBIN: 10.1 g/dL — AB (ref 12.0–15.0)
MCH: 29.8 pg (ref 26.0–34.0)
MCHC: 32.1 g/dL (ref 30.0–36.0)
MCV: 92.9 fL (ref 78.0–100.0)
PLATELETS: 164 10*3/uL (ref 150–400)
RBC: 3.39 MIL/uL — AB (ref 3.87–5.11)
RDW: 17.2 % — ABNORMAL HIGH (ref 11.5–15.5)
WBC: 4.4 10*3/uL (ref 4.0–10.5)

## 2016-07-31 LAB — CBC WITH DIFFERENTIAL/PLATELET
BASOS ABS: 0 10*3/uL (ref 0.0–0.1)
Basophils Relative: 0.5 % (ref 0.0–3.0)
EOS ABS: 0.2 10*3/uL (ref 0.0–0.7)
Eosinophils Relative: 4.6 % (ref 0.0–5.0)
HCT: 32.4 % — ABNORMAL LOW (ref 36.0–46.0)
Hemoglobin: 10.7 g/dL — ABNORMAL LOW (ref 12.0–15.0)
LYMPHS ABS: 1 10*3/uL (ref 0.7–4.0)
Lymphocytes Relative: 22.8 % (ref 12.0–46.0)
MCHC: 32.9 g/dL (ref 30.0–36.0)
MCV: 93.1 fl (ref 78.0–100.0)
MONOS PCT: 9.2 % (ref 3.0–12.0)
Monocytes Absolute: 0.4 10*3/uL (ref 0.1–1.0)
Neutro Abs: 2.8 10*3/uL (ref 1.4–7.7)
Neutrophils Relative %: 62.9 % (ref 43.0–77.0)
Platelets: 234 10*3/uL (ref 150.0–400.0)
RBC: 3.48 Mil/uL — AB (ref 3.87–5.11)
RDW: 18 % — ABNORMAL HIGH (ref 11.5–15.5)
WBC: 4.4 10*3/uL (ref 4.0–10.5)

## 2016-07-31 LAB — LIPID PANEL
Cholesterol: 186 mg/dL (ref 0–200)
HDL: 50 mg/dL (ref >39.00)
LDL CALC: 119 mg/dL — AB (ref 0–99)
NonHDL: 135.94
TRIGLYCERIDES: 83 mg/dL (ref 0.0–149.0)
Total CHOL/HDL Ratio: 4
VLDL: 16.6 mg/dL (ref 0.0–40.0)

## 2016-07-31 LAB — HEPATIC FUNCTION PANEL
ALBUMIN: 3.6 g/dL (ref 3.5–5.2)
ALT: 25 U/L (ref 0–35)
AST: 52 U/L — AB (ref 0–37)
Alkaline Phosphatase: 98 U/L (ref 39–117)
Bilirubin, Direct: 0.3 mg/dL (ref 0.0–0.3)
Total Bilirubin: 1.5 mg/dL — ABNORMAL HIGH (ref 0.2–1.2)
Total Protein: 7.3 g/dL (ref 6.0–8.3)

## 2016-07-31 LAB — TSH: TSH: 3.2 u[IU]/mL (ref 0.35–4.50)

## 2016-07-31 LAB — PROTIME-INR
INR: 1.14
PROTHROMBIN TIME: 14.7 s (ref 11.4–15.2)

## 2016-07-31 LAB — VITAMIN D 25 HYDROXY (VIT D DEFICIENCY, FRACTURES): VITD: 32.88 ng/mL (ref 30.00–100.00)

## 2016-07-31 SURGERY — IRRIGATION AND DEBRIDEMENT WOUND
Anesthesia: General | Site: Knee | Laterality: Right

## 2016-07-31 MED ORDER — SODIUM CHLORIDE 0.9 % IV SOLN
250.0000 mL | INTRAVENOUS | Status: DC | PRN
Start: 2016-07-31 — End: 2016-07-31

## 2016-07-31 MED ORDER — SODIUM CHLORIDE 0.9% FLUSH: 3.0000 mL | INTRAVENOUS | Status: DC | PRN

## 2016-07-31 MED ORDER — SODIUM CHLORIDE 0.9% FLUSH: 3.0000 mL | Freq: Two times a day (BID) | INTRAVENOUS | Status: DC

## 2016-07-31 MED ORDER — 0.9 % SODIUM CHLORIDE (POUR BTL) OPTIME
TOPICAL | Status: DC | PRN
  Administered 2016-07-31: 1000 mL

## 2016-07-31 MED ORDER — OXYCODONE HCL 5 MG PO TABS: 5.0000 mg | ORAL_TABLET | ORAL | Status: DC | PRN

## 2016-07-31 MED ORDER — MIDAZOLAM HCL 5 MG/5ML IJ SOLN
INTRAMUSCULAR | Status: DC | PRN
Start: 2016-07-31 — End: 2016-07-31
  Administered 2016-07-31: 2 mg via INTRAVENOUS

## 2016-07-31 MED ORDER — DEXAMETHASONE SODIUM PHOSPHATE 10 MG/ML IJ SOLN
INTRAMUSCULAR | Status: DC | PRN
  Administered 2016-07-31: 10 mg via INTRAVENOUS

## 2016-07-31 MED ORDER — ACETAMINOPHEN 325 MG PO TABS: 650.0000 mg | ORAL_TABLET | ORAL | Status: DC | PRN

## 2016-07-31 MED ORDER — ACETAMINOPHEN 650 MG RE SUPP: 650.0000 mg | RECTAL | Status: DC | PRN

## 2016-07-31 MED ORDER — FENTANYL CITRATE (PF) 100 MCG/2ML IJ SOLN
INTRAMUSCULAR | Status: AC
Start: 2016-07-31 — End: 2016-07-31
  Filled 2016-07-31: qty 2

## 2016-07-31 MED ORDER — MIDAZOLAM HCL 2 MG/2ML IJ SOLN
INTRAMUSCULAR | Status: AC
  Filled 2016-07-31: qty 2

## 2016-07-31 MED ORDER — PROPOFOL 10 MG/ML IV BOLUS
INTRAVENOUS | Status: DC | PRN
  Administered 2016-07-31: 150 mg via INTRAVENOUS

## 2016-07-31 MED ORDER — SODIUM CHLORIDE 0.9 % IV SOLN
INTRAVENOUS | Status: DC | PRN
Start: 2016-07-31 — End: 2016-07-31
  Administered 2016-07-31: 15:00:00 via INTRAVENOUS

## 2016-07-31 MED ORDER — LIDOCAINE 2% (20 MG/ML) 5 ML SYRINGE
INTRAMUSCULAR | Status: AC
  Filled 2016-07-31: qty 5

## 2016-07-31 MED ORDER — CIPROFLOXACIN IN D5W 400 MG/200ML IV SOLN
400.0000 mg | INTRAVENOUS | Status: AC
  Administered 2016-07-31: 400 mg via INTRAVENOUS
  Filled 2016-07-31: qty 200

## 2016-07-31 MED ORDER — ONDANSETRON HCL 4 MG/2ML IJ SOLN
INTRAMUSCULAR | Status: AC
  Filled 2016-07-31: qty 2

## 2016-07-31 MED ORDER — LACTATED RINGERS IV SOLN
INTRAVENOUS | Status: DC | PRN
  Administered 2016-07-31: 15:00:00 via INTRAVENOUS

## 2016-07-31 MED ORDER — HYDROMORPHONE HCL 1 MG/ML IJ SOLN: 0.2500 mg | INTRAMUSCULAR | Status: DC | PRN

## 2016-07-31 MED ORDER — LACTATED RINGERS IV SOLN
INTRAVENOUS | Status: DC
Start: 2016-07-31 — End: 2016-07-31
  Administered 2016-07-31: 13:00:00 via INTRAVENOUS

## 2016-07-31 MED ORDER — ONDANSETRON HCL 4 MG/2ML IJ SOLN
INTRAMUSCULAR | Status: DC | PRN
  Administered 2016-07-31: 4 mg via INTRAVENOUS

## 2016-07-31 MED ORDER — FENTANYL CITRATE (PF) 100 MCG/2ML IJ SOLN
INTRAMUSCULAR | Status: DC | PRN
  Administered 2016-07-31 (×2): 50 ug via INTRAVENOUS

## 2016-07-31 MED ORDER — DEXAMETHASONE SODIUM PHOSPHATE 10 MG/ML IJ SOLN
INTRAMUSCULAR | Status: AC
  Filled 2016-07-31: qty 1

## 2016-07-31 MED ORDER — PROMETHAZINE HCL 25 MG/ML IJ SOLN: 6.2500 mg | INTRAMUSCULAR | Status: DC | PRN

## 2016-07-31 MED ORDER — SCOPOLAMINE 1 MG/3DAYS TD PT72
1.0000 | MEDICATED_PATCH | TRANSDERMAL | Status: DC
  Administered 2016-07-31: 1 via TRANSDERMAL
  Filled 2016-07-31: qty 1

## 2016-07-31 SURGICAL SUPPLY — 47 items
AGENT HMST GEL NTOXPRSV FR 28 (Miscellaneous) ×2 IMPLANT
AGENT HMST PWDR HDRLZ BVN CLGN (Miscellaneous) ×4 IMPLANT
APL SKNCLS STERI-STRIP NONHPOA (GAUZE/BANDAGES/DRESSINGS)
APPLICATOR COTTON TIP 6IN STRL (MISCELLANEOUS) IMPLANT
BAG DECANTER FOR FLEXI CONT (MISCELLANEOUS) IMPLANT
BENZOIN TINCTURE PRP APPL 2/3 (GAUZE/BANDAGES/DRESSINGS) ×2 IMPLANT
BNDG COHESIVE 4X5 TAN STRL (GAUZE/BANDAGES/DRESSINGS) ×1 IMPLANT
CANISTER SUCT 3000ML PPV (MISCELLANEOUS) ×3 IMPLANT
COLLAGEN CELLERATERX 1 GRAM (Miscellaneous) ×2 IMPLANT
CONT SPEC STER OR (MISCELLANEOUS) IMPLANT
COVER SURGICAL LIGHT HANDLE (MISCELLANEOUS) ×3 IMPLANT
DRAPE IMP U-DRAPE 54X76 (DRAPES) ×3 IMPLANT
DRAPE INCISE IOBAN 66X45 STRL (DRAPES) IMPLANT
DRAPE LAPAROSCOPIC ABDOMINAL (DRAPES) IMPLANT
DRAPE LAPAROTOMY 100X72 PEDS (DRAPES) ×3 IMPLANT
DRAPE PROXIMA HALF (DRAPES) IMPLANT
DRESSING HYDROCOLLOID 4X4 (GAUZE/BANDAGES/DRESSINGS) ×3 IMPLANT
DRSG ADAPTIC 3X8 NADH LF (GAUZE/BANDAGES/DRESSINGS) IMPLANT
DRSG CUTIMED SORBACT 7X9 (GAUZE/BANDAGES/DRESSINGS) ×1 IMPLANT
DRSG PAD ABDOMINAL 8X10 ST (GAUZE/BANDAGES/DRESSINGS) ×1 IMPLANT
DRSG VAC ATS LRG SENSATRAC (GAUZE/BANDAGES/DRESSINGS) IMPLANT
DRSG VAC ATS MED SENSATRAC (GAUZE/BANDAGES/DRESSINGS) IMPLANT
DRSG VAC ATS SM SENSATRAC (GAUZE/BANDAGES/DRESSINGS) IMPLANT
ELECT CAUTERY BLADE 6.4 (BLADE) IMPLANT
ELECT REM PT RETURN 9FT ADLT (ELECTROSURGICAL) ×3
ELECTRODE REM PT RTRN 9FT ADLT (ELECTROSURGICAL) ×2 IMPLANT
GAUZE SPONGE 4X4 12PLY STRL (GAUZE/BANDAGES/DRESSINGS) ×1 IMPLANT
GEL CELLERATE 28G (Miscellaneous) ×1 IMPLANT
GLOVE BIO SURGEON STRL SZ 6.5 (GLOVE) ×3 IMPLANT
GOWN STRL REUS W/ TWL LRG LVL3 (GOWN DISPOSABLE) ×6 IMPLANT
GOWN STRL REUS W/TWL LRG LVL3 (GOWN DISPOSABLE) ×9
KIT BASIN OR (CUSTOM PROCEDURE TRAY) ×3 IMPLANT
KIT ROOM TURNOVER OR (KITS) ×3 IMPLANT
NEEDLE HYPO 25GX1X1/2 BEV (NEEDLE) ×3 IMPLANT
NS IRRIG 1000ML POUR BTL (IV SOLUTION) ×3 IMPLANT
PACK GENERAL/GYN (CUSTOM PROCEDURE TRAY) ×3 IMPLANT
PACK UNIVERSAL I (CUSTOM PROCEDURE TRAY) ×3 IMPLANT
PAD ARMBOARD 7.5X6 YLW CONV (MISCELLANEOUS) ×6 IMPLANT
STAPLER VISISTAT 35W (STAPLE) ×3 IMPLANT
SURGILUBE 2OZ TUBE FLIPTOP (MISCELLANEOUS) IMPLANT
SUT MNCRL AB 4-0 PS2 18 (SUTURE) IMPLANT
SUT VIC AB 5-0 PS2 18 (SUTURE) IMPLANT
SWAB COLLECTION DEVICE MRSA (MISCELLANEOUS) IMPLANT
SYR CONTROL 10ML LL (SYRINGE) ×3 IMPLANT
TOWEL OR 17X26 10 PK STRL BLUE (TOWEL DISPOSABLE) ×3 IMPLANT
TUBE ANAEROBIC SPECIMEN COL (MISCELLANEOUS) IMPLANT
UNDERPAD 30X30 (UNDERPADS AND DIAPERS) ×3 IMPLANT

## 2016-07-31 NOTE — Assessment & Plan Note (Signed)
Pt's PE WNL w/ exception of open R knee wound (surgical debridement scheduled for today).  UTD on cologuard, pap- no need for mammo due to mastectomy.  Check labs.  Anticipatory guidance provided.

## 2016-07-31 NOTE — H&P (Signed)
[name] is an 61 y.o. female.   Chief Complaint: right knee wound HPI:  The patient is a 61 yrs old wf here for treatment of a right knee wound.  She underwent placement of a knee joint.  She feel and sustained an opening of the area.  The area has not been able to heal.  There is hypergranulation 4 x 4 cm.  It does not appear to be infected at this time.  Past Medical History:  Diagnosis Date  . 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 Allergic reaction 07/24/2016  . Arthritis KNEES   right knee, hx. past left knee replacemnt.  . Blood clot in vein    around Portacath right chest-tx. Eliquis about a yr., prior Lovenox.  . Blood dyscrasia   . Chronic anticoagulation HX  PORT-A-CATH CLOT   2010 - HAS BEEN ON LOVENOX SINCE THE CLOT  . Drug rash 07/24/2016  . Elevated blood pressure reading without diagnosis of hypertension    PT MONITORS HER B/P AT HOME  . Heart murmur   . 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 PONV (postoperative nausea and vomiting)   . Skin cancer of anterior chest BREAST CANCER PRIMARY W/ METS TO CHEST WALL SKIN CANCER--  CHEMOTHERAPY EVERY 3 WEEKS AT [facility]  . Status post skin flap graft    right chest wall with metastatis right anterior chest -no drainage or open wound.    Past Surgical History:  Procedure Laterality Date  . CHEST WALL TUMOR EXCISION    . hemotoma     evacuation left chest wall  . I&D KNEE WITH POLY EXCHANGE Right 05/28/2016   Procedure: IRRIGATION AND DEBRIDEMENT KNEE WOUND VAC PLACMENT;  Surgeon: Susa Day, MD;  [facility];  Service: Orthopedics;  Laterality: Right;  . I&D KNEE WITH POLY EXCHANGE Right 05/30/2016   Procedure: RADICAL SYNOVECTOMY,IRRIGATION AND DEBRIDEMENT KNEE WITH POLY EXCHANGE WITH ANTIBIOTIC BEADS, APPLICATION OF WOUND VAC;  Surgeon: Susa Day, MD;  Location: [facility];  Service:  Orthopedics;  Laterality: Right;  . IRRIGATION AND DEBRIDEMENT KNEE Right 04/12/2016   Procedure: IRRIGATION AND DEBRIDEMENT KNEE;  Surgeon: Nicholes Stairs, MD;  Location: [facility];  Service: Orthopedics;  Laterality: Right;  . KNEE ARTHROSCOPY  02/13/2012   Procedure: ARTHROSCOPY KNEE;  Surgeon: Johnn Hai, MD;  Location: [facility];  Service: Orthopedics;  Laterality: Left;  WITH DEBRIDEMENt   . KNEE ARTHROSCOPY WITH LATERAL MENISECTOMY  02/13/2012   Procedure: KNEE ARTHROSCOPY WITH LATERAL MENISECTOMY;  Surgeon: Johnn Hai, MD;  Location: [place];  Service: Orthopedics;;  partial  . LEFT MODIFIED RADICAL MASTECTOMY/ RIGHT TOTAL MASTECTOMY  11-13-2007   LEFT BREAST CANCER W/ AXILLARY LYMPH NODE METASTASIS AND POST NEOADJUVANT CHEMO  . PLACEMENT PORT-A-CATH  06/22/2007   right chest  . SKIN GRAFT     chest-post tumor removal, second occurrence of cancer" 2015 surgery for Flap 2015"  . TOTAL KNEE ARTHROPLASTY Left 09/23/2012   Procedure: LEFT TOTAL KNEE ARTHROPLASTY;  Surgeon: Johnn Hai, MD;  [facility];  Service: Orthopedics;  Laterality: Left;  . TOTAL KNEE ARTHROPLASTY Right 03/20/2016   Procedure: RIGHT TOTAL KNEE ARTHROPLASTY;  Surgeon: Susa Day, MD;  [facility];  Service: Orthopedics;  Laterality: Right;  . TRANSTHORACIC ECHOCARDIOGRAM  11-18-2011  DR BENSIMHON (  ECHO EVERY 3 MONTHS)   HX CHEMO INDUCED CARDIOTOXICITY/  LVSF NORMAL/ EF 55-50%/ MILD MITRIAL VALVE REGURG./ MILDLY INCREASED SYSTOLIC PRESSURE OF PULMONARY ARTERIES  . VASCULAR SURGERY Right    portacath chest    Family History  Problem Relation Age of Onset  . Heart disease Mother     due to mitral valve regurgiation,   . Cancer Mother     breast  . Allergic rhinitis Mother   . Parkinsonism Father   . Allergic rhinitis Father   . Migraines Father   . Alcohol abuse Paternal Grandfather   . Angioedema Neg Hx   . Asthma Neg Hx   . Eczema Neg Hx     Social History:  reports that she has never smoked. She has never used smokeless tobacco. She reports that she does not drink alcohol or use drugs.  Allergies:  Allergies  Allergen Reactions  . Penicillins Hives, Itching and Rash    Denies airway involvement   . Other Rash    STERI STRIPS - Blisters  . Tape Hives    Can tolerate paper tape    No prescriptions prior to admission.    No results found for this or any previous visit (from the past 48 hour(s)). No results found.  Review of Systems  Constitutional: Negative.   HENT: Negative.   Eyes: Negative.   Respiratory: Negative.   Cardiovascular: Negative.   Gastrointestinal: Negative.   Genitourinary: Negative.   Musculoskeletal: Positive for joint pain.  Skin: Negative.   Neurological: Negative.   Psychiatric/Behavioral: Negative.     Last menstrual period 06/27/2007. Physical Exam  Constitutional: She is oriented to person, place, and time. She appears well-developed and well-nourished.  HENT:  Head: Normocephalic and atraumatic.  Eyes: EOM are normal. Pupils are equal, round, and reactive to light.  Cardiovascular: Normal rate.   Respiratory: Effort normal. No respiratory distress.  GI: Soft.  Neurological: She is alert and oriented to person, place, and time.  Skin: Skin is warm.  Psychiatric: She has a normal mood and affect. Her behavior is normal. Judgment and thought content normal.     Assessment/Plan Recommend debridment of the right knee with cellerate placement.  Wallace Going, DO 07/31/2016, 10:53 AM

## 2016-07-31 NOTE — Progress Notes (Signed)
   Subjective:    Patient ID: [name], female    DOB: Apr 29, 1956, 61 y.o.   MRN: [id number]  HPI Physical exam- UTD on cologuard, pap.  No need for mammo due to mastectomy.  Pt has knee debridement today   Review of Systems Patient reports no vision/ hearing changes, adenopathy, weight change,  persistant/recurrent hoarseness , swallowing issues, chest pain, palpitations, edema, persistant/recurrent cough, hemoptysis, dyspnea (rest/exertional/paroxysmal nocturnal), gastrointestinal bleeding (melena, rectal bleeding), abdominal pain, significant heartburn, bowel changes, GU symptoms (dysuria, hematuria, incontinence), Gyn symptoms (abnormal  bleeding, pain),  syncope, focal weakness, memory loss, skin/hair/nail changes, abnormal bruising or bleeding, anxiety, or depression.   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    Objective:   Physical Exam General Appearance:    Alert, cooperative, no distress, appears stated age, thin/frail  Head:    Normocephalic, without obvious abnormality, atraumatic  Eyes:    PERRL, conjunctiva/corneas clear, EOM's intact, fundi    benign, both eyes  Ears:    Normal TM's and external ear canals, both ears  Nose:   Nares normal, septum midline, mucosa normal, no drainage    or sinus tenderness  Throat:   Lips, mucosa, and tongue normal; teeth and gums normal  Neck:   Supple, symmetrical, trachea midline, no adenopathy;    Thyroid: no enlargement/tenderness/nodules  Back:     Symmetric, no curvature, ROM normal, no CVA tenderness  Lungs:     Clear to auscultation bilaterally, respirations unlabored  Chest Wall:    No tenderness or deformity   Heart:    Regular rate and rhythm, S1 and S2 normal, no murmur, rub   or gallop  Breast Exam:    Deferred to GYN  Abdomen:     Soft, non-tender, bowel sounds active all four quadrants,    no masses, no organomegaly  Genitalia:    Deferred to GYN  Rectal:    Extremities:   R knee wound, otherwise WNL  Pulses:    2+ and symmetric all extremities  Skin:   Skin color, texture, turgor normal, no rashes or lesions  Lymph nodes:   Cervical, supraclavicular, and axillary nodes normal  Neurologic:   CNII-XII intact, normal strength, sensation and reflexes    throughout          Assessment & Plan:

## 2016-07-31 NOTE — Interval H&P Note (Signed)
History and Physical Interval Note:  07/31/2016 3:08 PM  [name]  has presented today for surgery, with the diagnosis of OPEN RIGHT KNEE WOUND  The various methods of treatment have been discussed with the patient and family. After consideration of risks, benefits and other options for treatment, the patient has consented to  Procedure(s): IRRIGATION AND DEBRIDEMENT RIGHT KNEE  WOUND (Right) [place] (Right) as a surgical intervention .  The patient's history has been reviewed, patient examined, no change in status, stable for surgery.  I have reviewed the patient's chart and labs.  Questions were answered to the patient's satisfaction.     Wallace Going

## 2016-07-31 NOTE — Anesthesia Postprocedure Evaluation (Signed)
Anesthesia Post Note  Patient: SAVAHANNA ALMENDARIZ  Procedure(s) Performed: Procedure(s) (LRB): IRRIGATION AND DEBRIDEMENT RIGHT KNEE  WOUND (Right) CELLERATE COLLAGEN PLACEMENT (Right)  Patient location during evaluation: PACU Anesthesia Type: General Level of consciousness: awake and alert Pain management: pain level controlled Vital Signs Assessment: post-procedure vital signs reviewed and stable Respiratory status: spontaneous breathing, nonlabored ventilation, respiratory function stable and patient connected to nasal cannula oxygen Cardiovascular status: blood pressure returned to baseline and stable Postop Assessment: no signs of nausea or vomiting Anesthetic complications: no       Last Vitals:  Vitals:   07/31/16 1216 07/31/16 1600  BP: (!) 138/59   Pulse: 99   Resp: 18   Temp: 36.9 C 36.7 C    Last Pain:  Vitals:   07/31/16 1600  TempSrc:   PainSc: 0-No pain                 Brendi Mccarroll S

## 2016-07-31 NOTE — Op Note (Signed)
DATE OF OPERATION: 07/31/2016  LOCATION: [facility] Operating Room outpatient  PREOPERATIVE DIAGNOSIS: Open right knee wound  POSTOPERATIVE DIAGNOSIS: Same  PROCEDURE:  1. Sharp excisional debridement of right knee wound 4 x 6 cm 2. Preparation of right knee wound for placement of Celleratel (gel and 2 gm powder)  SURGEON: Shaquaya Wuellner Sanger Daneshia Tavano, DO  ASSISTANT: Shawn Rayburn, PA  EBL: 10 cc  CONDITION: Stable  COMPLICATIONS: None  INDICATION: The patient, [name], is a 61 y.o. female born on 19-Oct-1955, is here for treatment of a chronic right knee wound after a fall.  She underwent a knee replacement prior to the fall and the open area occurred at the incision site.  She has undergone local care without improvement.   PROCEDURE DETAILS:  The patient was seen prior to surgery and marked.  The IV antibiotics were given. The patient was taken to the operating room and given a general anesthetic. A standard time out was performed and all information was confirmed by those in the room. SCD was placed on the left leg.  The leg was irrigated and debrided with antibiotic solution and saline.  The #10 blade was used to excise the 4 x 6 cm wound  Of skin and soft tissue.  There was no visibility of the implant.  Hemostasis was achieved with electrocautery and pressure.  The cellerate powder 2 gm was applied with gel on top.  The sorbac was applied and secured with 5-0 Vicryl. The patient was allowed to wake up and taken to recovery room in stable condition at the end of the case. The family was notified at the end of the case.

## 2016-07-31 NOTE — Anesthesia Procedure Notes (Signed)
Procedure Name: LMA Insertion Date/Time: 07/31/2016 3:33 PM Performed by: Eligha Bridegroom Pre-anesthesia Checklist: Patient identified, Emergency Drugs available, Suction available, Patient being monitored and Timeout performed Patient Re-evaluated:Patient Re-evaluated prior to inductionOxygen Delivery Method: Circle system utilized Preoxygenation: Pre-oxygenation with 100% oxygen Intubation Type: IV induction Ventilation: Mask ventilation without difficulty LMA Size: 4.0 Placement Confirmation: positive ETCO2 and breath sounds checked- equal and bilateral Tube secured with: Tape Dental Injury: Teeth and Oropharynx as per pre-operative assessment

## 2016-07-31 NOTE — Anesthesia Preprocedure Evaluation (Signed)
Anesthesia Evaluation  Patient identified by MRN, date of birth, ID band Patient awake    Reviewed: Allergy & Precautions, NPO status , Patient's Chart, lab work & pertinent test results  Airway Mallampati: II  TM Distance: >3 FB Neck ROM: Full    Dental no notable dental hx.    Pulmonary neg pulmonary ROS,    Pulmonary exam normal breath sounds clear to auscultation       Cardiovascular + DOE  Normal cardiovascular exam Rhythm:Regular Rate:Normal     Neuro/Psych negative neurological ROS  negative psych ROS   GI/Hepatic negative GI ROS, Neg liver ROS,   Endo/Other  negative endocrine ROS  Renal/GU negative Renal ROS  negative genitourinary   Musculoskeletal negative musculoskeletal ROS (+)   Abdominal   Peds negative pediatric ROS (+)  Hematology  (+) anemia ,   Anesthesia Other Findings   Reproductive/Obstetrics negative OB ROS                             Anesthesia Physical Anesthesia Plan  ASA: III  Anesthesia Plan: General   Post-op Pain Management:    Induction: Intravenous  Airway Management Planned: LMA  Additional Equipment:   Intra-op Plan:   Post-operative Plan:   Informed Consent: I have reviewed the patients History and Physical, chart, labs and discussed the procedure including the risks, benefits and alternatives for the proposed anesthesia with the patient or authorized representative who has indicated his/her understanding and acceptance.   Dental advisory given  Plan Discussed with: CRNA and Surgeon  Anesthesia Plan Comments:         Anesthesia Quick Evaluation

## 2016-07-31 NOTE — Discharge Instructions (Signed)
KY gel to the wound daily Don't get wet

## 2016-07-31 NOTE — Patient Instructions (Addendum)
Follow up in 1 year or as needed We'll notify you of your lab results and make any changes if needed Keep up the good work!  You look great! Hang in there! Good thoughts for a speedy recovery and smooth surgery today!!!

## 2016-07-31 NOTE — Transfer of Care (Signed)
Immediate Anesthesia Transfer of Care Note  Patient: [name]  Procedure(s) Performed: Procedure(s): IRRIGATION AND DEBRIDEMENT RIGHT KNEE  WOUND (Right) CELLERATE COLLAGEN PLACEMENT (Right)  Patient Location: PACU  Anesthesia Type:General  Level of Consciousness: awake, alert  and oriented  Airway & Oxygen Therapy: Patient Spontanous Breathing  Post-op Assessment: Report given to RN and Post -op Vital signs reviewed and stable  Post vital signs: Reviewed and stable  Last Vitals:  Vitals:   07/31/16 1216 07/31/16 1600  BP: (!) 138/59   Pulse: 99   Resp: 18   Temp: 36.9 C (P) 36.7 C    Last Pain:  Vitals:   07/31/16 1239  TempSrc:   PainSc: 0-No pain         Complications: No apparent anesthesia complications

## 2016-07-31 NOTE — Progress Notes (Signed)
Pre visit review using our clinic review tool, if applicable. No additional management support is needed unless otherwise documented below in the visit note. 

## 2016-08-01 ENCOUNTER — Encounter (HOSPITAL_COMMUNITY): Payer: Self-pay | Admitting: Plastic Surgery

## 2016-08-01 ENCOUNTER — Encounter: Payer: Self-pay | Admitting: Internal Medicine

## 2016-08-01 MED FILL — ZOLPIDEM TARTRATE 5 MG TAB: 5 | 30 days supply | Qty: 30 | Fill #4

## 2016-08-08 DIAGNOSIS — C792 Secondary malignant neoplasm of skin: Secondary | ICD-10-CM | POA: Diagnosis not present

## 2016-08-08 DIAGNOSIS — Z96651 Presence of right artificial knee joint: Secondary | ICD-10-CM | POA: Diagnosis not present

## 2016-08-08 DIAGNOSIS — C50919 Malignant neoplasm of unspecified site of unspecified female breast: Secondary | ICD-10-CM | POA: Diagnosis not present

## 2016-08-08 DIAGNOSIS — S81001D Unspecified open wound, right knee, subsequent encounter: Secondary | ICD-10-CM | POA: Diagnosis not present

## 2016-08-08 DIAGNOSIS — C50412 Malignant neoplasm of upper-outer quadrant of left female breast: Secondary | ICD-10-CM | POA: Diagnosis not present

## 2016-08-08 DIAGNOSIS — T8189XD Other complications of procedures, not elsewhere classified, subsequent encounter: Secondary | ICD-10-CM | POA: Diagnosis not present

## 2016-08-12 DIAGNOSIS — C50412 Malignant neoplasm of upper-outer quadrant of left female breast: Secondary | ICD-10-CM | POA: Diagnosis not present

## 2016-08-12 DIAGNOSIS — Z171 Estrogen receptor negative status [ER-]: Secondary | ICD-10-CM | POA: Diagnosis not present

## 2016-08-12 DIAGNOSIS — I749 Embolism and thrombosis of unspecified artery: Secondary | ICD-10-CM | POA: Diagnosis not present

## 2016-08-12 DIAGNOSIS — Z5112 Encounter for antineoplastic immunotherapy: Secondary | ICD-10-CM | POA: Diagnosis not present

## 2016-08-12 DIAGNOSIS — C792 Secondary malignant neoplasm of skin: Secondary | ICD-10-CM | POA: Diagnosis not present

## 2016-08-12 DIAGNOSIS — Z9013 Acquired absence of bilateral breasts and nipples: Secondary | ICD-10-CM | POA: Diagnosis not present

## 2016-08-12 DIAGNOSIS — C50919 Malignant neoplasm of unspecified site of unspecified female breast: Secondary | ICD-10-CM | POA: Diagnosis not present

## 2016-08-12 DIAGNOSIS — Z923 Personal history of irradiation: Secondary | ICD-10-CM | POA: Diagnosis not present

## 2016-08-12 MED FILL — GABAPENTIN 300 MG CAPSULE: 300 | 30 days supply | Qty: 90 | Fill #4

## 2016-08-18 MED FILL — DARIFENACIN ER 15 MG TABLET: 15 | 30 days supply | Qty: 30 | Fill #8

## 2016-08-22 DIAGNOSIS — T8189XD Other complications of procedures, not elsewhere classified, subsequent encounter: Secondary | ICD-10-CM | POA: Diagnosis not present

## 2016-08-22 DIAGNOSIS — S81001D Unspecified open wound, right knee, subsequent encounter: Secondary | ICD-10-CM | POA: Diagnosis not present

## 2016-08-25 ENCOUNTER — Encounter: Payer: Self-pay | Admitting: Internal Medicine

## 2016-08-25 ENCOUNTER — Encounter: Payer: Self-pay | Admitting: Family Medicine

## 2016-08-25 DIAGNOSIS — C761 Malignant neoplasm of thorax: Secondary | ICD-10-CM | POA: Diagnosis not present

## 2016-08-26 ENCOUNTER — Ambulatory Visit: Payer: Self-pay | Admitting: Internal Medicine

## 2016-08-26 NOTE — Telephone Encounter (Signed)
Last OV 07/31/16 (CPE) Tramadol last filled 04/21/16 #30 with 0

## 2016-08-26 NOTE — Telephone Encounter (Signed)
Would recommend she get through Ortho since PCP out of office and they are treating knee issues.

## 2016-08-28 ENCOUNTER — Other Ambulatory Visit: Payer: Self-pay | Admitting: Occupational Medicine

## 2016-08-28 ENCOUNTER — Ambulatory Visit: Payer: Self-pay

## 2016-08-28 DIAGNOSIS — M25562 Pain in left knee: Secondary | ICD-10-CM

## 2016-08-29 DIAGNOSIS — M25562 Pain in left knee: Secondary | ICD-10-CM | POA: Diagnosis not present

## 2016-08-29 DIAGNOSIS — S8002XA Contusion of left knee, initial encounter: Secondary | ICD-10-CM | POA: Diagnosis not present

## 2016-09-01 MED FILL — ZOLPIDEM TARTRATE 5 MG TAB: 5 | 30 days supply | Qty: 30 | Fill #5

## 2016-09-02 DIAGNOSIS — Z171 Estrogen receptor negative status [ER-]: Secondary | ICD-10-CM | POA: Diagnosis not present

## 2016-09-02 DIAGNOSIS — C50412 Malignant neoplasm of upper-outer quadrant of left female breast: Secondary | ICD-10-CM | POA: Diagnosis not present

## 2016-09-02 DIAGNOSIS — C792 Secondary malignant neoplasm of skin: Secondary | ICD-10-CM | POA: Diagnosis not present

## 2016-09-02 DIAGNOSIS — Z5112 Encounter for antineoplastic immunotherapy: Secondary | ICD-10-CM | POA: Diagnosis not present

## 2016-09-05 DIAGNOSIS — S81001D Unspecified open wound, right knee, subsequent encounter: Secondary | ICD-10-CM | POA: Diagnosis not present

## 2016-09-05 DIAGNOSIS — T8189XD Other complications of procedures, not elsewhere classified, subsequent encounter: Secondary | ICD-10-CM | POA: Diagnosis not present

## 2016-09-15 DIAGNOSIS — S8002XD Contusion of left knee, subsequent encounter: Secondary | ICD-10-CM | POA: Diagnosis not present

## 2016-09-15 DIAGNOSIS — T8189XD Other complications of procedures, not elsewhere classified, subsequent encounter: Secondary | ICD-10-CM | POA: Diagnosis not present

## 2016-09-15 DIAGNOSIS — S81001D Unspecified open wound, right knee, subsequent encounter: Secondary | ICD-10-CM | POA: Diagnosis not present

## 2016-09-22 MED FILL — DARIFENACIN ER 15 MG TABLET: 15 | 30 days supply | Qty: 30 | Fill #9

## 2016-09-25 ENCOUNTER — Encounter: Payer: Self-pay | Admitting: Physical Therapy

## 2016-09-25 ENCOUNTER — Ambulatory Visit: Payer: PRIVATE HEALTH INSURANCE | Attending: Specialist | Admitting: Physical Therapy

## 2016-09-25 DIAGNOSIS — M25662 Stiffness of left knee, not elsewhere classified: Secondary | ICD-10-CM | POA: Insufficient documentation

## 2016-09-25 DIAGNOSIS — M6281 Muscle weakness (generalized): Secondary | ICD-10-CM | POA: Diagnosis present

## 2016-09-25 DIAGNOSIS — R600 Localized edema: Secondary | ICD-10-CM | POA: Insufficient documentation

## 2016-09-25 DIAGNOSIS — M25562 Pain in left knee: Secondary | ICD-10-CM | POA: Insufficient documentation

## 2016-09-25 NOTE — Patient Instructions (Addendum)
[employer]    Straighten operated leg and try to hold it _3___ seconds. Use ____ lbs on ankle. Repeat __20__ times. Do __1__ sessions a day.  http://gt2.exer.us/311   Copyright  VHI. All rights reserved.   (Clinic) Hip: External Rotation - Sitting    Left side toward pulley, strap around ankle, leg out, sit with knees over edge of table. Pull foot toward body. May hold table to keep body still. Repeat __10__ times per set. Do __1__ sets per session. Do __1__ sessions per day. Use ____ lb weights.  Copyright  VHI. All rights reserved.   Hamstring Curl: Resisted (Sitting)    Facing anchor with tubing on right ankle, leg straight out, bend knee. Repeat __20__ times per set. Do __1__ sets per session. Do _1__ sessions per day.   http://orth.exer.us/669   Copyright  VHI. All rights reserved.    ABDUCTION: Standing - Resistance Band (Active)    Stand, feet flat. Against yellow resistance band, lift right leg out to side. Complete __1_ sets of _10__ repetitions. Perform __1_ sessions per day.  http://gtsc.exer.us/117   Copyright  VHI. All rights reserved.   Balance, Proprioception: Hip Extension With Tubing    With tubing attached to ankle of uninvolved leg, swing leg back. Return. Repeat __10__ times or for ____ minutes. Do __1__ sessions per day.  http://cc.exer.us/19   Copyright  VHI. All rights reserved.

## 2016-09-25 NOTE — Therapy (Signed)
[facility] [address], [place], [place], [address] Phone: [phone]   Fax:  [phone]  Physical Therapy Evaluation  Patient Details  Name: [name] MRN: [id number] Date of Birth: 01-28-1956 Referring Provider: Susa Day   Encounter Date: 09/25/2016      PT End of Session - 09/25/16 1142    Visit Number 1   Number of Visits 6   Date for PT Re-Evaluation 11/20/16   Authorization Type W/C   Authorization - Visit Number 1   Authorization - Number of Visits 6   PT Start Time 1055   PT Stop Time 1140   PT Time Calculation (min) 45 min   Activity Tolerance Patient tolerated treatment well   Behavior During Therapy [facility] for tasks assessed/performed      Past Medical History:  Diagnosis Date  . 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 Allergic reaction 07/24/2016  . Arthritis KNEES   right knee, hx. past left knee replacemnt.  . Blood clot in vein    around Portacath right chest-tx. Eliquis about a yr., prior Lovenox.  . Blood dyscrasia   . Chronic anticoagulation HX  PORT-A-CATH CLOT   2010 - HAS BEEN ON LOVENOX SINCE THE CLOT  . Drug rash 07/24/2016  . Elevated blood pressure reading without diagnosis of hypertension    PT MONITORS HER B/P AT HOME  . Heart murmur   . 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 PONV (postoperative nausea and vomiting)   . Skin cancer of anterior chest BREAST CANCER PRIMARY W/ METS TO CHEST WALL SKIN CANCER--  CHEMOTHERAPY EVERY 3 WEEKS AT [facility]  . Status post skin flap graft    right chest wall with metastatis right anterior chest -no drainage or open wound.    Past Surgical History:  Procedure Laterality Date  . APPLICATION OF A-CELL OF BACK Right 07/31/2016   Procedure: CELLERATE COLLAGEN PLACEMENT;  Surgeon: Loel Lofty Dillingham, DO;   Location: [facility];  Service: Plastics;  Laterality: Right;  . CHEST WALL TUMOR EXCISION    . hemotoma     evacuation left chest wall  . I&D KNEE WITH POLY EXCHANGE Right 05/28/2016   Procedure: IRRIGATION AND DEBRIDEMENT KNEE WOUND VAC PLACMENT;  Surgeon: Susa Day, MD;  Location: [facility];  Service: Orthopedics;  Laterality: Right;  . I&D KNEE WITH POLY EXCHANGE Right 05/30/2016   Procedure: RADICAL SYNOVECTOMY,IRRIGATION AND DEBRIDEMENT KNEE WITH POLY EXCHANGE WITH ANTIBIOTIC BEADS, APPLICATION OF WOUND VAC;  Surgeon: Susa Day, MD;  Location: [facility];  Service: Orthopedics;  Laterality: Right;  . INCISION AND DRAINAGE OF WOUND Right 07/31/2016   Procedure: IRRIGATION AND DEBRIDEMENT RIGHT KNEE  WOUND;  Surgeon: Loel Lofty Dillingham, DO;  Location: [facility];  Service: Plastics;  Laterality: Right;  . IRRIGATION AND DEBRIDEMENT KNEE Right 04/12/2016   Procedure: IRRIGATION AND DEBRIDEMENT KNEE;  Surgeon: Nicholes Stairs, MD;  Location: [facility];  Service: Orthopedics;  Laterality: Right;  . KNEE ARTHROSCOPY  02/13/2012   Procedure: ARTHROSCOPY KNEE;  Surgeon: Johnn Hai, MD;  Location: [facility];  Service: Orthopedics;  Laterality: Left;  WITH DEBRIDEMENt   . KNEE ARTHROSCOPY WITH LATERAL MENISECTOMY  02/13/2012   Procedure: KNEE ARTHROSCOPY WITH LATERAL MENISECTOMY;  Surgeon: Johnn Hai, MD;  Location: [facility];  Service: Orthopedics;;  partial  . LEFT MODIFIED RADICAL MASTECTOMY/ RIGHT TOTAL MASTECTOMY  11-13-2007   LEFT BREAST CANCER W/ AXILLARY LYMPH NODE METASTASIS AND POST NEOADJUVANT CHEMO  . PLACEMENT PORT-A-CATH  06/22/2007   right chest  . SKIN GRAFT     chest-post tumor removal, second occurrence of cancer" 2015 surgery for Flap 2015"  . TOTAL KNEE ARTHROPLASTY Left 09/23/2012   Procedure: LEFT TOTAL KNEE ARTHROPLASTY;  Surgeon: Johnn Hai, MD;  Location: [facility];  Service: Orthopedics;  Laterality: Left;  . TOTAL KNEE ARTHROPLASTY Right  03/20/2016   Procedure: RIGHT TOTAL KNEE ARTHROPLASTY;  Surgeon: Susa Day, MD;  Location: [facility];  Service: Orthopedics;  Laterality: Right;  . TRANSTHORACIC ECHOCARDIOGRAM  11-18-2011  DR BENSIMHON (ECHO EVERY 3 MONTHS)   HX CHEMO INDUCED CARDIOTOXICITY/  LVSF NORMAL/ EF 55-50%/ MILD MITRIAL VALVE REGURG./ MILDLY INCREASED SYSTOLIC PRESSURE OF PULMONARY ARTERIES  . VASCULAR SURGERY Right    portacath chest    There were no vitals filed for this visit.       Subjective Assessment - 09/25/16 1057    Subjective It's all (the pain) just in my left knee now.  Patient is here for more motion and strength.  Gets stiff and painful getting home.  The morning it feels great.  States she gets slight fever every night that she takes medicine for and it resolves, doctor is aware.   Pertinent History slow healing wound Rt knee replacement, chemo treatment every 3 months, multiple knee surgeries due to infection   Limitations Standing;Walking   Patient Stated Goals Increased strength, extension motion, improve balance, walking   Currently in Pain? Yes  no pain currently   Pain Score 8   7 or 8 at night   Pain Location Knee   Pain Orientation Left   Pain Descriptors / Indicators Aching;Constant   Pain Type Surgical pain   Pain Onset More than a month ago   Pain Frequency Intermittent   Aggravating Factors  walking and standing   Pain Relieving Factors rest   Effect of Pain on Daily Activities wants to get back to getting up more at work   Multiple Pain Sites No            [facility] PT Assessment - 09/25/16 0001      Assessment   Medical Diagnosis S80.02XD (ICD-10-CM) - Contusion of left knee, subsequent encounter   Referring Provider BEANE, JEFFREY    Onset Date/Surgical Date 08/26/16   Prior Therapy yes     Precautions   Precautions None     Restrictions   Weight Bearing Restrictions No     Balance Screen   Has the patient fallen in the past 6 months Yes   How many times? 1    Has the patient had a decrease in activity level because of a fear of falling?  No   Is the patient reluctant to leave their home because of a fear of falling?  No     Home [employer] residence   Living Arrangements Other relatives     Prior Function   Level of Independence Independent   Vocation Full time employment   Vocation Requirements 40% standing and walking     Cognition   Overall Cognitive Status Within Functional Limits for tasks assessed     Observation/Other Assessments   Focus on Therapeutic Outcomes (FOTO)  50% limitation  goal 39% limited     Posture/Postural Control   Posture/Postural  Control Postural limitations     ROM / Strength   AROM / PROM / Strength AROM;Strength     AROM   AROM Assessment Site Knee   Right/Left Knee Right;Left   Right Knee Extension 6   Right Knee Flexion 102   Left Knee Extension 5   Left Knee Flexion 97     Strength   Strength Assessment Site Knee;Hip   Right/Left Hip Right;Left   Right Hip Flexion 4+/5   Right Hip Extension 5/5   Right Hip External Rotation  5/5   Right Hip Internal Rotation 5/5   Right Hip ABduction 3+/5   Right Hip ADduction 4+/5   Left Hip Flexion 4/5   Left Hip Extension 4/5   Left Hip External Rotation 4-/5   Left Hip Internal Rotation 4-/5   Left Hip ABduction 3/5   Left Hip ADduction 3+/5   Right/Left Knee Right;Left   Right Knee Flexion 5/5   Right Knee Extension 5/5   Left Knee Flexion 4+/5   Left Knee Extension 4/5  pain   Right Ankle Dorsiflexion 4/5   Left Ankle Dorsiflexion 3+/5     Palpation   Palpation comment tender to medial knee; hematoma forming large golf ball sized knot in patellar region around patellar tendon     Ambulation/Gait   Gait Pattern Left flexed knee in stance;Right flexed knee in stance;Left hip hike     Standardized Balance Assessment   Standardized Balance Assessment Five Times Sit to Stand   Five times sit to stand comments   16 seconds no UE  increased forward trunk lean; demonstrating core/glute weak     Berg Balance Test   Sit to Stand Able to stand without using hands and stabilize independently   Standing Unsupported Able to stand safely 2 minutes   Sitting with Back Unsupported but Feet Supported on Floor or Stool Able to sit safely and securely 2 minutes   Stand to Sit Sits safely with minimal use of hands   Transfers Able to transfer safely, minor use of hands   Standing Unsupported with Eyes Closed Able to stand 10 seconds safely   Standing Ubsupported with Feet Together Able to place feet together independently and stand 1 minute safely   Standing Unsupported, Alternately Place Feet on Step/Stool Able to stand independently and safely and complete 8 steps in 20 seconds   Standing on One Leg Tries to lift leg/unable to hold 3 seconds but remains standing independently                   [facility] Adult PT Treatment/Exercise - 09/25/16 0001      Exercises   Exercises Knee/Hip;Other Exercises   Other Exercises  educated and performed HEP: step ups, standing hip ext and flexion, seated hamstring curl, LAQ, hip internal and external rotation                PT Education - 09/25/16 1133    Education provided Yes   Education Details initial LE strengthening HEP   Person(s) Educated Patient   Methods Explanation;Demonstration;Tactile cues;Verbal cues;Handout   Comprehension Verbalized understanding;Returned demonstration                    Plan - 09/25/16 1211    Clinical Impression Statement Patient has had multiple surgeries on right knee with complications due to infection after knee replacement.  Pt more recently fell and hit left knee.  She had contusion that has gone down quite  a bit but continues to have pain and weakness of left knee.  Pt has decreased strength and endurance.  She works full time but would like to get back to full duty as well as be able to walk for  exercise.  Pt has bilateral LE weakness Left>right from 3/5 MMT.  Pt states her pain gets up to 8/10 at night sometimes.  Her knee flexion and extension ROM is 97 flexion and 5 from neutral on left LE.  Pt has gait and posture deficits.  Her balance is poor on left and unable to perform single leg stand for more than 2 seconds.  Pt will benefit from skilled PT to address impairments so she can return to full function at work.  Pt was moderate complexity due to examination of bilateral LE hip, knee and ankle as well as gait and balance and complicating factors such as slow healing wound Rt knee replacement, chemo treatment every 3 months, multiple knee surgeries due to infection.  Pt has unstable condition with uncontrolled pain at times.   Rehab Potential Good   Clinical Impairments Affecting Rehab Potential slow healing wound Rt knee replacement, chemo treatment every 3 months, multiple knee surgeries due to infection   PT Frequency 2x / week   PT Duration 8 weeks  currently approved for 6 visits   PT Treatment/Interventions ADLs/Self Care Home Management;Biofeedback;Electrical Stimulation;Moist Heat;Patient/family education;Manual techniques;Manual lymph drainage;Neuromuscular re-education;Balance training;Therapeutic exercise;Therapeutic activities;Gait training;Stair training;Taping;Vasopneumatic Device;Passive range of motion   PT Next Visit Plan LE strength as tolerated, stretching and ROM, vasopnuematic or other modaility for swelling and pain management   Recommended Other Services n/a   Consulted and Agree with Plan of Care Patient      Patient will benefit from skilled therapeutic intervention in order to improve the following deficits and impairments:  Abnormal gait, Decreased balance, Decreased activity tolerance, Difficulty walking, Decreased strength, Pain, Increased edema  Visit Diagnosis: Acute pain of left knee  Muscle weakness (generalized)  Stiffness of left knee, not elsewhere  classified  Localized edema     Problem List Patient Active Problem List   Diagnosis Date Noted  . Physical exam 07/31/2016  . Allergic reaction 07/24/2016  . Drug rash 07/24/2016  . S/P total knee arthroplasty, right 05/31/2016  . Infection of prosthetic right knee joint ([place]) 05/31/2016  . Nonhealing surgical wound 05/28/2016  . Sepsis ([place]) 05/10/2016  . UTI (urinary tract infection) 05/10/2016  . Cellulitis of right knee 05/10/2016  . Blood clot in vein   . Wound dehiscence, surgical 04/12/2016  . 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 Vitamin D deficiency 01/25/2016  . Metastatic cancer to chest wall ([place]) 02/02/2015  . Primary osteoarthritis of right knee 02/01/2015  . Anemia 02/01/2015  . Frequent UTI 01/12/2014  . Sinus tachycardia 11/03/2013  . DOE (dyspnea on exertion) 11/03/2013  . Postoperative anemia due to acute blood loss 12/24/2012  .    09/23/2012  . Sinusitis 07/28/2012  . Neuropathy due to chemotherapeutic drug ([place]) 07/25/2011  . 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 Metastatic breast cancer ([place]) 11/12/2010  . Chronic anticoagulation 11/12/2010  . Anxiety, generalized 03/26/2008    Zannie Cove, PT 09/25/2016, 2:10 PM  [facility] [address], [place], [place], [address] Phone: [phone]   Fax:  [phone]  Name: [name] MRN: [id number] Date of Birth: [birth year]

## 2016-09-26 DIAGNOSIS — Z5112 Encounter for antineoplastic immunotherapy: Secondary | ICD-10-CM | POA: Diagnosis not present

## 2016-09-26 DIAGNOSIS — C50412 Malignant neoplasm of upper-outer quadrant of left female breast: Secondary | ICD-10-CM | POA: Diagnosis not present

## 2016-09-26 DIAGNOSIS — C792 Secondary malignant neoplasm of skin: Secondary | ICD-10-CM | POA: Diagnosis not present

## 2016-09-29 ENCOUNTER — Encounter: Payer: Self-pay | Admitting: Physical Therapy

## 2016-09-29 ENCOUNTER — Ambulatory Visit: Payer: PRIVATE HEALTH INSURANCE | Admitting: Physical Therapy

## 2016-09-29 DIAGNOSIS — M25562 Pain in left knee: Secondary | ICD-10-CM | POA: Diagnosis not present

## 2016-09-29 DIAGNOSIS — R600 Localized edema: Secondary | ICD-10-CM

## 2016-09-29 DIAGNOSIS — M6281 Muscle weakness (generalized): Secondary | ICD-10-CM

## 2016-09-29 DIAGNOSIS — T8189XD Other complications of procedures, not elsewhere classified, subsequent encounter: Secondary | ICD-10-CM | POA: Diagnosis not present

## 2016-09-29 DIAGNOSIS — M25662 Stiffness of left knee, not elsewhere classified: Secondary | ICD-10-CM

## 2016-09-29 DIAGNOSIS — S81001D Unspecified open wound, right knee, subsequent encounter: Secondary | ICD-10-CM | POA: Diagnosis not present

## 2016-09-29 NOTE — Patient Instructions (Signed)
Straight Leg Raise    Tighten stomach and slowly raise locked left leg __6__ inches from floor. Repeat _10___ times per set. Do __2__ sets per session. Do _1___ sessions per day.  http://orth.exer.us/1103   Copyright  VHI. All rights reserved.  Heel Slides    Squeeze pelvic floor and hold. Slide left heel along bed towards bottom. Hold for _3__ seconds. Slide back to flat knee position. Repeat _10__ times. Do 1___ times a day. Repeat with other leg.    Copyright  VHI. All rights reserved.  [facility] [address], [place], [place] [address] Phone # [phone] Fax [phone]

## 2016-09-29 NOTE — Therapy (Signed)
[facility] [address] [place], [place], [address] Phone: [phone]   Fax:  [phone]  Physical Therapy Treatment  Patient Details  Name: [name] MRN: [id number] Date of Birth: 09-Jul-1955 Referring Provider: Susa Day   Encounter Date: 09/29/2016      PT End of Session - 09/29/16 1648    Visit Number 2   Number of Visits 6   Date for PT Re-Evaluation 11/20/16   Authorization Type W/C   Authorization - Visit Number 2   Authorization - Number of Visits 6   PT Start Time 2297   PT Stop Time 1700   PT Time Calculation (min) 45 min   Activity Tolerance Patient tolerated treatment well   Behavior During Therapy [facility] for tasks assessed/performed      Past Medical History:  Diagnosis Date  . 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 Allergic reaction 07/24/2016  . Arthritis KNEES   right knee, hx. past left knee replacemnt.  . Blood clot in vein    around Portacath right chest-tx. Eliquis about a yr., prior Lovenox.  . Blood dyscrasia   . Chronic anticoagulation HX  PORT-A-CATH CLOT   2010 - HAS BEEN ON LOVENOX SINCE THE CLOT  . Drug rash 07/24/2016  . Elevated blood pressure reading without diagnosis of hypertension    PT MONITORS HER B/P AT HOME  . Heart murmur   . 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 PONV (postoperative nausea and vomiting)   . Skin cancer of anterior chest BREAST CANCER PRIMARY W/ METS TO CHEST WALL SKIN CANCER--  CHEMOTHERAPY EVERY 3 WEEKS AT [facility]  . Status post skin flap graft    right chest wall with metastatis right anterior chest -no drainage or open wound.    Past Surgical History:  Procedure Laterality Date  . APPLICATION OF A-CELL OF BACK Right 07/31/2016   Procedure: CELLERATE COLLAGEN PLACEMENT;  Surgeon: Loel Lofty Dillingham, DO;   Location: [place];  Service: Plastics;  Laterality: Right;  . CHEST WALL TUMOR EXCISION    . hemotoma     evacuation left chest wall  . I&D KNEE WITH POLY EXCHANGE Right 05/28/2016   Procedure: IRRIGATION AND DEBRIDEMENT KNEE WOUND VAC PLACMENT;  Surgeon: Susa Day, MD;  [facility];  Service: Orthopedics;  Laterality: Right;  . I&D KNEE WITH POLY EXCHANGE Right 05/30/2016   Procedure: RADICAL SYNOVECTOMY,IRRIGATION AND DEBRIDEMENT KNEE WITH POLY EXCHANGE WITH ANTIBIOTIC BEADS, APPLICATION OF WOUND VAC;  Surgeon: Susa Day, MD;  [facility];  Service: Orthopedics;  Laterality: Right;  . INCISION AND DRAINAGE OF WOUND Right 07/31/2016   Procedure: IRRIGATION AND DEBRIDEMENT RIGHT KNEE  WOUND;  Surgeon: Loel Lofty Dillingham, DO;  Location: [place];  Service: Plastics;  Laterality: Right;  . IRRIGATION AND DEBRIDEMENT KNEE Right 04/12/2016   Procedure: IRRIGATION AND DEBRIDEMENT KNEE;  Surgeon: Nicholes Stairs, MD;  [facility];  Service: Orthopedics;  Laterality: Right;  . KNEE ARTHROSCOPY  02/13/2012   Procedure: ARTHROSCOPY KNEE;  Surgeon: Johnn Hai, MD;  Location: [facility];  Service: Orthopedics;  Laterality: Left;  WITH DEBRIDEMENt   . KNEE ARTHROSCOPY WITH LATERAL MENISECTOMY  02/13/2012   Procedure: KNEE ARTHROSCOPY WITH LATERAL MENISECTOMY;  Surgeon: Johnn Hai, MD;  Location: [facility];  Service: Orthopedics;;  partial  . LEFT MODIFIED RADICAL MASTECTOMY/ RIGHT TOTAL MASTECTOMY  11-13-2007   LEFT BREAST CANCER W/ AXILLARY LYMPH NODE METASTASIS AND POST NEOADJUVANT CHEMO  . PLACEMENT PORT-A-CATH  06/22/2007   right chest  . SKIN GRAFT     chest-post tumor removal, second occurrence of cancer" 2015 surgery for Flap 2015"  . TOTAL KNEE ARTHROPLASTY Left 09/23/2012   Procedure: LEFT TOTAL KNEE ARTHROPLASTY;  Surgeon: Johnn Hai, MD;  [facility];  Service: Orthopedics;  Laterality: Left;  . TOTAL KNEE ARTHROPLASTY Right  03/20/2016   Procedure: RIGHT TOTAL KNEE ARTHROPLASTY;  Surgeon: Susa Day, MD;  [facility];  Service: Orthopedics;  Laterality: Right;  . TRANSTHORACIC ECHOCARDIOGRAM  11-18-2011  DR BENSIMHON (ECHO EVERY 3 MONTHS)   HX CHEMO INDUCED CARDIOTOXICITY/  LVSF NORMAL/ EF 55-50%/ MILD MITRIAL VALVE REGURG./ MILDLY INCREASED SYSTOLIC PRESSURE OF PULMONARY ARTERIES  . VASCULAR SURGERY Right    portacath chest    There were no vitals filed for this visit.      Subjective Assessment - 09/29/16 1626    Subjective I am feeling pretty good.  I was worried about my knee being week. End of work day I have increased pain.    Pertinent History slow healing wound Rt knee replacement, chemo treatment every 3 months, multiple knee surgeries due to infection   Limitations Standing;Walking   Patient Stated Goals Increased strength, extension motion, improve balance, walking   Currently in Pain? Yes   Pain Score 4    Pain Location Knee   Pain Orientation Left   Pain Descriptors / Indicators Aching   Pain Type Acute pain   Pain Onset More than a month ago   Pain Frequency Intermittent   Aggravating Factors  walking, standing and as the day progresses   Pain Relieving Factors rest   Effect of Pain on Daily Activities wants to get back to getting up more at work   Multiple Pain Sites No                         [facility] Adult PT Treatment/Exercise - 09/29/16 0001      Knee/Hip Exercises: Aerobic   Recumbent Bike 6 min, level 2 no arms     Knee/Hip Exercises: Standing   Rebounder all 3 ways 1 min     Knee/Hip Exercises: Supine   Short Arc Quad Sets Strengthening;Left;3 sets;10 reps   Heel Slides Strengthening;Left;1 set;20 reps   Straight Leg Raises Strengthening;Left;2 sets;10 reps  with abdominal contraction     Modalities   Modalities Vasopneumatic     Vasopneumatic   Number Minutes Vasopneumatic  15 minutes   Vasopnuematic Location  Knee  left   Vasopneumatic  Pressure Medium   Vasopneumatic Temperature  3 snowflakes                PT Education - 09/29/16 1647    Education provided Yes   Education Details straigh leg raise, heel slides   Person(s) Educated Patient   Methods Explanation;Demonstration;Verbal cues;Handout   Comprehension Returned demonstration;Verbalized understanding          PT Short Term Goals - 09/29/16 1701      PT SHORT TERM GOAL #1   Title be independent in initial HEP to increase strength for prolongued standing   Baseline given 09/25/16   Time 4   Period Weeks   Status On-going     PT SHORT TERM GOAL #2  Title able to maintain single leg stand on left LE for >3 sec for improved stabilty and reduced risk of falls.   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   Time 8   Period Weeks   Status New     PT LONG TERM GOAL #2   Title reduce FOTO to < or = to 39% limitation   Time 8   Period Weeks   Status New     PT LONG TERM GOAL #3   Title left knee ROM improved to > 107 degrees flexion and extension to neutral for improved functional gait and stairs   Time 8   Period Weeks   Status New     PT LONG TERM GOAL #4   Title able to stand and walk for 5 hours out of a 10 hour shift for improved function at 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  Patient has a good HEP for knee strengthening. Patient continue to have increased swelling of left knee by the patella. Patient was able to exercise without increased pain.    Rehab Potential Good   Clinical Impairments Affecting Rehab Potential slow healing wound Rt knee replacement, chemo treatment every 3 months, multiple knee surgeries due to infection   PT Frequency 2x / week   PT Duration 8 weeks  currently approved 6 visits   PT Treatment/Interventions ADLs/Self Care Home Management;Biofeedback;Electrical Stimulation;Moist Heat;Patient/family education;Manual techniques;Manual lymph drainage;Neuromuscular re-education;Balance training;Therapeutic exercise;Therapeutic activities;Gait training;Stair training;Taping;Vasopneumatic Device;Passive range of motion   PT Next Visit Plan LE strength as tolerated, stretching and ROM, vasopnuematic or other modaility for swelling and pain management; left hip strength exercises   PT Home Exercise Plan progress as needed   Consulted and Agree with Plan of Care Patient      Patient will benefit from skilled therapeutic intervention in order to improve the following deficits and impairments:  Abnormal gait, Decreased balance, Decreased activity tolerance, Difficulty walking, Decreased strength, Pain, Increased edema  Visit Diagnosis: Acute pain of left knee  Muscle weakness (generalized)  Stiffness of left knee, not elsewhere classified  Localized edema     Problem List Patient Active Problem List   Diagnosis Date Noted  . Physical  exam 07/31/2016  . Allergic reaction 07/24/2016  . Drug rash 07/24/2016  . S/P total knee arthroplasty, right 05/31/2016  . Infection of prosthetic right knee joint ([place])  05/31/2016  . Nonhealing surgical wound 05/28/2016  . Sepsis ([place]) 05/10/2016  . UTI (urinary tract infection) 05/10/2016  . Cellulitis of right knee 05/10/2016  . Blood clot in vein   . Wound dehiscence, surgical 04/12/2016  . 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 Vitamin D deficiency 01/25/2016  . Metastatic cancer to chest wall ([place]) 02/02/2015  . Primary osteoarthritis of right knee 02/01/2015  . Anemia 02/01/2015  . Frequent UTI 01/12/2014  . Sinus tachycardia 11/03/2013  . DOE (dyspnea on exertion) 11/03/2013  . Postoperative anemia due to acute blood loss 12/24/2012  .    09/23/2012  . Sinusitis 07/28/2012  . Neuropathy due to chemotherapeutic drug ([place]) 07/25/2011  . 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 Metastatic breast cancer ([place]) 11/12/2010  . Chronic anticoagulation 11/12/2010  . Anxiety, generalized 03/26/2008    Earlie Counts, PT 09/29/16 5:03 PM   [facility] [address] [place], [place], [address] Phone: [phone]   Fax:  [phone]  Name: [name] MRN: [id number] Date of Birth: 08-23-1955

## 2016-10-02 ENCOUNTER — Other Ambulatory Visit: Payer: Self-pay | Admitting: Family Medicine

## 2016-10-02 ENCOUNTER — Encounter: Payer: Self-pay | Admitting: Physical Therapy

## 2016-10-02 ENCOUNTER — Ambulatory Visit: Payer: PRIVATE HEALTH INSURANCE | Admitting: Physical Therapy

## 2016-10-02 DIAGNOSIS — M25662 Stiffness of left knee, not elsewhere classified: Secondary | ICD-10-CM

## 2016-10-02 DIAGNOSIS — M25562 Pain in left knee: Secondary | ICD-10-CM

## 2016-10-02 DIAGNOSIS — M6281 Muscle weakness (generalized): Secondary | ICD-10-CM

## 2016-10-02 DIAGNOSIS — R600 Localized edema: Secondary | ICD-10-CM

## 2016-10-02 MED FILL — ZOLPIDEM TARTRATE 5 MG TAB: 5 | 30 days supply | Qty: 30 | Fill #0

## 2016-10-02 NOTE — Telephone Encounter (Signed)
Medication filled to pharmacy as requested.   

## 2016-10-02 NOTE — Therapy (Signed)
[facility] [address] [place], [place], [address] Phone: [phone]   Fax:  [phone]  Physical Therapy Treatment  Patient Details  Name: [name] MRN: [id number] Date of Birth: [birth year] Referring Provider: Susa Day   Encounter Date: 10/02/2016      PT End of Session - 10/02/16 1704    Visit Number 3   Number of Visits 6   Date for PT Re-Evaluation 11/20/16   Authorization Type W/C   Authorization - Visit Number 3   Authorization - Number of Visits 6   PT Start Time 1245   PT Stop Time 1655   PT Time Calculation (min) 40 min   Activity Tolerance Patient tolerated treatment well   Behavior During Therapy [facility] for tasks assessed/performed      Past Medical History:  Diagnosis Date  . 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 Allergic reaction 07/24/2016  . Arthritis KNEES   right knee, hx. past left knee replacemnt.  . Blood clot in vein    around Portacath right chest-tx. Eliquis about a yr., prior Lovenox.  . Blood dyscrasia   . Chronic anticoagulation HX  PORT-A-CATH CLOT   2010 - HAS BEEN ON LOVENOX SINCE THE CLOT  . Drug rash 07/24/2016  . Elevated blood pressure reading without diagnosis of hypertension    PT MONITORS HER B/P AT HOME  . Heart murmur   . 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 PONV (postoperative nausea and vomiting)   . Skin cancer of anterior chest BREAST CANCER PRIMARY W/ METS TO CHEST WALL SKIN CANCER--  CHEMOTHERAPY EVERY 3 WEEKS AT [facility]  . Status post skin flap graft    right chest wall with metastatis right anterior chest -no drainage or open wound.    Past Surgical History:  Procedure Laterality Date  . APPLICATION OF A-CELL OF BACK Right 07/31/2016   Procedure: CELLERATE COLLAGEN PLACEMENT;  Surgeon: Loel Lofty Dillingham, DO;   Location: [place];  Service: Plastics;  Laterality: Right;  . CHEST WALL TUMOR EXCISION    . hemotoma     evacuation left chest wall  . I&D KNEE WITH POLY EXCHANGE Right 05/28/2016   Procedure: IRRIGATION AND DEBRIDEMENT KNEE WOUND VAC PLACMENT;  Surgeon: Susa Day, MD;  [facility];  Service: Orthopedics;  Laterality: Right;  . I&D KNEE WITH POLY EXCHANGE Right 05/30/2016   Procedure: RADICAL SYNOVECTOMY,IRRIGATION AND DEBRIDEMENT KNEE WITH POLY EXCHANGE WITH ANTIBIOTIC BEADS, APPLICATION OF WOUND VAC;  Surgeon: Susa Day, MD;  [facility];  Service: Orthopedics;  Laterality: Right;  . INCISION AND DRAINAGE OF WOUND Right 07/31/2016   Procedure: IRRIGATION AND DEBRIDEMENT RIGHT KNEE  WOUND;  Surgeon: Loel Lofty Dillingham, DO;  Location: [place];  Service: Plastics;  Laterality: Right;  . IRRIGATION AND DEBRIDEMENT KNEE Right 04/12/2016   Procedure: IRRIGATION AND DEBRIDEMENT KNEE;  Surgeon: Nicholes Stairs, MD;  [facility];  Service: Orthopedics;  Laterality: Right;  . KNEE ARTHROSCOPY  02/13/2012   Procedure: ARTHROSCOPY KNEE;  Surgeon: Johnn Hai, MD;  Location: [facility];  Service: Orthopedics;  Laterality: Left;  WITH DEBRIDEMENt   . KNEE ARTHROSCOPY WITH LATERAL MENISECTOMY  02/13/2012   Procedure: KNEE ARTHROSCOPY WITH LATERAL MENISECTOMY;  Surgeon: Johnn Hai, MD;  Location: [facility];  Service: Orthopedics;;  partial  . LEFT MODIFIED RADICAL MASTECTOMY/ RIGHT TOTAL MASTECTOMY  11-13-2007   LEFT BREAST CANCER W/ AXILLARY LYMPH NODE METASTASIS AND POST NEOADJUVANT CHEMO  . PLACEMENT PORT-A-CATH  06/22/2007   right chest  . SKIN GRAFT     chest-post tumor removal, second occurrence of cancer" 2015 surgery for Flap 2015"  . TOTAL KNEE ARTHROPLASTY Left 09/23/2012   Procedure: LEFT TOTAL KNEE ARTHROPLASTY;  Surgeon: Javier Docker, MD;  [facility];  Service: Orthopedics;  Laterality: Left;  . TOTAL KNEE ARTHROPLASTY Right  03/20/2016   Procedure: RIGHT TOTAL KNEE ARTHROPLASTY;  Surgeon: Jene Every, MD;  [facility];  Service: Orthopedics;  Laterality: Right;  . TRANSTHORACIC ECHOCARDIOGRAM  11-18-2011  DR BENSIMHON (ECHO EVERY 3 MONTHS)   HX CHEMO INDUCED CARDIOTOXICITY/  LVSF NORMAL/ EF 55-50%/ MILD MITRIAL VALVE REGURG./ MILDLY INCREASED SYSTOLIC PRESSURE OF PULMONARY ARTERIES  . VASCULAR SURGERY Right    portacath chest    There were no vitals filed for this visit.      Subjective Assessment - 10/02/16 1621    Subjective I am sore from working today. I sore the next day after the visit.    Pertinent History slow healing wound Rt knee replacement, chemo treatment every 3 months, multiple knee surgeries due to infection   Limitations Standing;Walking   Patient Stated Goals Increased strength, extension motion, improve balance, walking   Currently in Pain? Yes   Pain Score 6    Pain Location Knee   Pain Orientation Left   Pain Descriptors / Indicators Aching   Pain Type Acute pain   Pain Onset More than a month ago   Pain Frequency Intermittent   Aggravating Factors  end of work day, walking, standing and as the day progresses   Pain Relieving Factors rest   Effect of Pain on Daily Activities wants to get back to getting up more at work   Multiple Pain Sites No            [facility] PT Assessment - 10/02/16 0001      Observation/Other Assessments   Observations left knee anteriorly is swollen and bruise is purple and brown      ROM / Strength   AROM / PROM / Strength PROM     AROM   Left Knee Extension 5   Left Knee Flexion 115     PROM   PROM Assessment Site Knee   Right/Left Knee Left   Left Knee Extension 0   Left Knee Flexion 120     Strength   Left Hip Flexion 5/5   Left Hip Extension 4/5   Left Hip External Rotation 5/5   Left Hip Internal Rotation 5/5   Left Hip ABduction 3/5   Left Hip ADduction 3+/5   Left Knee Flexion 4+/5   Left Knee Extension 4/5  pain    Left Ankle Dorsiflexion 3+/5     Palpation   Palpation comment tenderness located on medial left knee                     [facility] Adult PT Treatment/Exercise - 10/02/16 0001      Knee/Hip Exercises: Aerobic   Stationary Bike 6 min level 1     Knee/Hip Exercises: Standing   Rebounder all 3 ways 1 min     Knee/Hip Exercises: Seated   Long Arc Quad Left;Strengthening;2 sets;15 reps;Weights   Long Arc Quad Weight 2 lbs.   Hamstring Curl 2  sets;Left;Strengthening;15 reps  green band     Knee/Hip Exercises: Supine   Straight Leg Raises Strengthening;Left;2 sets;15 reps  with abdominal contraction   Other Supine Knee/Hip Exercises hip abduction with left foot on slider 2x10                  PT Short Term Goals - 09/29/16 1701      PT SHORT TERM GOAL #1   Title be independent in initial HEP to increase strength for prolongued standing   Baseline given 09/25/16   Time 4   Period Weeks   Status On-going     PT SHORT TERM GOAL #2   Title able to maintain single leg stand on left LE for >3 sec for improved stabilty and reduced risk of falls.   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   Time 8   Period Weeks   Status New     PT LONG TERM GOAL #2   Title reduce FOTO to < or = to 39% limitation   Time 8   Period Weeks   Status New     PT LONG TERM GOAL #3   Title left knee  ROM improved to > 107 degrees flexion and extension to neutral for improved functional gait and stairs   Time 8   Period Weeks   Status New     PT LONG TERM GOAL #4   Title able to stand and walk for 5 hours out of a 10 hour shift for 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 No significant changes of the strength.  Patient continues to have swelling on the anterior left knee with purple and brown bruising.  Patient is able to exercise without increase in left knee pain.  Patient has palpable tenderness located in medial left knee.  Patient will benefit from skilled therapy to reduce pain and swelling to restore function.    Rehab Potential Good   Clinical Impairments Affecting Rehab Potential slow healing wound Rt knee replacement, chemo treatment every 3 months, multiple knee surgeries due to infection   PT Frequency 2x / week   PT Duration 8 weeks  approved 6 visits   PT Treatment/Interventions ADLs/Self Care  Home Management;Biofeedback;Electrical Stimulation;Moist Heat;Patient/family education;Manual techniques;Manual lymph drainage;Neuromuscular re-education;Balance training;Therapeutic exercise;Therapeutic activities;Gait training;Stair training;Taping;Vasopneumatic Device;Passive range of motion   PT Next Visit Plan LE strength as tolerated, stretching and ROM, ; left hip strength exercises   PT Home Exercise Plan progress as needed   Consulted and Agree with Plan of Care Patient      Patient will benefit from skilled therapeutic intervention in order to improve the following deficits and impairments:  Abnormal gait, Decreased balance, Decreased activity tolerance, Difficulty  walking, Decreased strength, Pain, Increased edema  Visit Diagnosis: Acute pain of left knee  Muscle weakness (generalized)  Stiffness of left knee, not elsewhere classified  Localized edema     Problem List Patient Active Problem List   Diagnosis Date Noted  . Physical exam 07/31/2016  . Allergic reaction 07/24/2016  . Drug rash 07/24/2016  . S/P total knee arthroplasty, right 05/31/2016  . Infection of prosthetic right knee joint ([place]) 05/31/2016  . Nonhealing surgical wound 05/28/2016  . Sepsis ([place]) 05/10/2016  . UTI (urinary tract infection) 05/10/2016  . Cellulitis of right knee 05/10/2016  . Blood clot in vein   . Wound dehiscence, surgical 04/12/2016  . 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 Vitamin D deficiency 01/25/2016  . 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 Primary osteoarthritis of right knee 02/01/2015  . Anemia 02/01/2015  . Frequent UTI 01/12/2014  . Sinus tachycardia 11/03/2013  . DOE (dyspnea on exertion) 11/03/2013  . Postoperative anemia due to acute blood loss 12/24/2012  .    09/23/2012  . Sinusitis 07/28/2012  . Neuropathy due to chemotherapeutic drug ([place]) 07/25/2011  . 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 Metastatic breast cancer ([place]) 11/12/2010  . Chronic anticoagulation 11/12/2010  . Anxiety, generalized 03/26/2008    Earlie Counts, PT 10/02/16 5:05 PM    [facility] [address] [place], [place], [address] Phone: [phone]   Fax:  [phone]  Name: STEVEY STAPLETON MRN: [id number] Date of Birth: February 18, 1956

## 2016-10-02 NOTE — Telephone Encounter (Signed)
Last OV 07/31/16 Zolpidem 03/17/16 #30 with PRN

## 2016-10-07 ENCOUNTER — Ambulatory Visit: Payer: PRIVATE HEALTH INSURANCE | Admitting: Physical Therapy

## 2016-10-07 DIAGNOSIS — M25561 Pain in right knee: Secondary | ICD-10-CM | POA: Diagnosis not present

## 2016-10-07 DIAGNOSIS — G8929 Other chronic pain: Secondary | ICD-10-CM | POA: Diagnosis not present

## 2016-10-09 ENCOUNTER — Ambulatory Visit: Payer: PRIVATE HEALTH INSURANCE | Admitting: Physical Therapy

## 2016-10-10 ENCOUNTER — Encounter: Payer: Self-pay | Admitting: Family Medicine

## 2016-10-10 DIAGNOSIS — I749 Embolism and thrombosis of unspecified artery: Secondary | ICD-10-CM | POA: Diagnosis not present

## 2016-10-10 DIAGNOSIS — C50919 Malignant neoplasm of unspecified site of unspecified female breast: Secondary | ICD-10-CM | POA: Diagnosis not present

## 2016-10-10 DIAGNOSIS — C792 Secondary malignant neoplasm of skin: Secondary | ICD-10-CM | POA: Diagnosis not present

## 2016-10-10 DIAGNOSIS — R937 Abnormal findings on diagnostic imaging of other parts of musculoskeletal system: Secondary | ICD-10-CM | POA: Diagnosis not present

## 2016-10-10 DIAGNOSIS — Z853 Personal history of malignant neoplasm of breast: Secondary | ICD-10-CM | POA: Diagnosis not present

## 2016-10-10 MED ORDER — CELECOXIB 200 MG PO CAPS: 200.0000 mg | ORAL_CAPSULE | Freq: Two times a day (BID) | ORAL | 6 refills | Status: DC

## 2016-10-13 ENCOUNTER — Ambulatory Visit: Payer: PRIVATE HEALTH INSURANCE | Admitting: Physical Therapy

## 2016-10-13 ENCOUNTER — Encounter: Payer: Self-pay | Admitting: Physical Therapy

## 2016-10-13 DIAGNOSIS — M25662 Stiffness of left knee, not elsewhere classified: Secondary | ICD-10-CM

## 2016-10-13 DIAGNOSIS — M6281 Muscle weakness (generalized): Secondary | ICD-10-CM

## 2016-10-13 DIAGNOSIS — M25562 Pain in left knee: Secondary | ICD-10-CM

## 2016-10-13 DIAGNOSIS — R600 Localized edema: Secondary | ICD-10-CM

## 2016-10-13 NOTE — Therapy (Signed)
[facility] [address], [place], [place], [address] Phone: [phone]   Fax:  [phone]  Physical Therapy Treatment  Patient Details  Name: [name] MRN: [id number] Date of Birth: 15-May-1956 Referring Provider: Dr. Susa Day  Encounter Date: 10/13/2016      PT End of Session - 10/13/16 1640    Visit Number 4   Date for PT Re-Evaluation 11/20/16   Authorization Type W/C   Authorization - Visit Number 4   Authorization - Number of Visits 6   PT Start Time 1628  came late   PT Stop Time 1700   PT Time Calculation (min) 32 min   Activity Tolerance Patient tolerated treatment well   Behavior During Therapy [facility] for tasks assessed/performed      Past Medical History:  Diagnosis Date  . 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 Allergic reaction 07/24/2016  . Arthritis KNEES   right knee, hx. past left knee replacemnt.  . Blood clot in vein    around Portacath right chest-tx. Eliquis about a yr., prior Lovenox.  . Blood dyscrasia   . Chronic anticoagulation HX  PORT-A-CATH CLOT   2010 - HAS BEEN ON LOVENOX SINCE THE CLOT  . Drug rash 07/24/2016  . Elevated blood pressure reading without diagnosis of hypertension    PT MONITORS HER B/P AT HOME  . Heart murmur   . 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 PONV (postoperative nausea and vomiting)   . Skin cancer of anterior chest BREAST CANCER PRIMARY W/ METS TO CHEST WALL SKIN CANCER--  CHEMOTHERAPY EVERY 3 WEEKS AT [facility]  . Status post skin flap graft    right chest wall with metastatis right anterior chest -no drainage or open wound.    Past Surgical History:  Procedure Laterality Date  . APPLICATION OF A-CELL OF BACK Right 07/31/2016   Procedure: CELLERATE COLLAGEN PLACEMENT;  Surgeon: Loel Lofty Dillingham, DO;   Location: [place];  Service: Plastics;  Laterality: Right;  . CHEST WALL TUMOR EXCISION    . hemotoma     evacuation left chest wall  . I&D KNEE WITH POLY EXCHANGE Right 05/28/2016   Procedure: IRRIGATION AND DEBRIDEMENT KNEE WOUND VAC PLACMENT;  Surgeon: Susa Day, MD;  [facility];  Service: Orthopedics;  Laterality: Right;  . I&D KNEE WITH POLY EXCHANGE Right 05/30/2016   Procedure: RADICAL SYNOVECTOMY,IRRIGATION AND DEBRIDEMENT KNEE WITH POLY EXCHANGE WITH ANTIBIOTIC BEADS, APPLICATION OF WOUND VAC;  Surgeon: Susa Day, MD;  [facility];  Service: Orthopedics;  Laterality: Right;  . INCISION AND DRAINAGE OF WOUND Right 07/31/2016   Procedure: IRRIGATION AND DEBRIDEMENT RIGHT KNEE  WOUND;  Surgeon: Loel Lofty Dillingham, DO;  Location: [place];  Service: Plastics;  Laterality: Right;  . IRRIGATION AND DEBRIDEMENT KNEE Right 04/12/2016   Procedure: IRRIGATION AND DEBRIDEMENT KNEE;  Surgeon: Nicholes Stairs, MD;  [facility];  Service: Orthopedics;  Laterality: Right;  . KNEE ARTHROSCOPY  02/13/2012   Procedure: ARTHROSCOPY KNEE;  Surgeon: Johnn Hai, MD;  Location: [facility];  Service: Orthopedics;  Laterality: Left;  WITH DEBRIDEMENt   . KNEE ARTHROSCOPY WITH LATERAL MENISECTOMY  02/13/2012   Procedure: KNEE ARTHROSCOPY WITH LATERAL MENISECTOMY;  Surgeon: Johnn Hai, MD;  Location: [place];  Service: Orthopedics;;  partial  . LEFT MODIFIED RADICAL MASTECTOMY/ RIGHT TOTAL MASTECTOMY  11-13-2007   LEFT BREAST CANCER W/ AXILLARY LYMPH NODE METASTASIS AND POST NEOADJUVANT CHEMO  . PLACEMENT PORT-A-CATH  06/22/2007   right chest  . SKIN GRAFT     chest-post tumor removal, second occurrence of cancer" 2015 surgery for Flap 2015"  . TOTAL KNEE ARTHROPLASTY Left 09/23/2012   Procedure: LEFT TOTAL KNEE ARTHROPLASTY;  Surgeon: Johnn Hai, MD;  [facility];  Service: Orthopedics;  Laterality: Left;  . TOTAL KNEE ARTHROPLASTY Right  03/20/2016   Procedure: RIGHT TOTAL KNEE ARTHROPLASTY;  Surgeon: Susa Day, MD;  [facility];  Service: Orthopedics;  Laterality: Right;  . TRANSTHORACIC ECHOCARDIOGRAM  11-18-2011  DR BENSIMHON (ECHO EVERY 3 MONTHS)   HX CHEMO INDUCED CARDIOTOXICITY/  LVSF NORMAL/ EF 55-50%/ MILD MITRIAL VALVE REGURG./ MILDLY INCREASED SYSTOLIC PRESSURE OF PULMONARY ARTERIES  . VASCULAR SURGERY Right    portacath chest    There were no vitals filed for this visit.      Subjective Assessment - 10/13/16 1632    Subjective I saw the doctor. MD said the since the hemotoma is still there she is not to wear lead. Workers comp said I can have more visit.    Pertinent History slow healing wound Rt knee replacement, chemo treatment every 3 months, multiple knee surgeries due to infection   Limitations Standing;Walking   Patient Stated Goals Increased strength, extension motion, improve balance, walking   Currently in Pain? Yes   Pain Score 5    Pain Location Knee   Pain Orientation Left   Pain Descriptors / Indicators Aching   Pain Type Acute pain   Pain Onset More than a month ago   Pain Frequency Intermittent   Aggravating Factors  end of work day, walking, standing and as the day progresses   Pain Relieving Factors rest   Multiple Pain Sites No            [facility] PT Assessment - 10/13/16 0001      Assessment   Medical Diagnosis S80.02XD (ICD-10-CM) - Contusion of left knee, subsequent encounter   Referring Provider Dr. Susa Day   Onset Date/Surgical Date 08/26/16   Prior Therapy yes     Precautions   Precautions None     Restrictions   Weight Bearing Restrictions No     Observation/Other Assessments   Observations left knee anteriorly is swollen and bruise is purple and brown      AROM   Left Knee Extension 5   Left Knee Flexion 115     PROM   Left Knee Extension 0   Left Knee Flexion 120     Strength   Left Hip Flexion 5/5   Left Hip Extension 4/5   Left Hip  External Rotation 5/5   Left Hip Internal Rotation 5/5   Left Hip ABduction 3/5   Left Hip ADduction 3+/5   Left Knee Flexion 4+/5   Left Knee Extension 4/5  pain   Left Ankle Dorsiflexion 3+/5     Palpation   Palpation comment tenderness located on medial left knee                     [facility] Adult PT Treatment/Exercise - 10/13/16 0001      Knee/Hip Exercises: Stretches   Active Hamstring Stretch Left;2 reps;30 seconds   Active Hamstring Stretch Limitations sitting   Gastroc Stretch 2 reps;30 seconds  standing     Knee/Hip  Exercises: Aerobic   Stationary Bike 6 min level 3     Knee/Hip Exercises: Standing   Hip ADduction Strengthening;Left;2 sets;10 reps  no weight   Lateral Step Up 1 set;10 reps;Hand Hold: 2;Step Height: 4"  going to left   Lateral Step Up Limitations tried 6 inch but too difficult   Forward Step Up 1 set;10 reps;Left;Hand Hold: 2;Step Height: 6"  work on full contraction of gluteals and quad   Rebounder all 3 ways 1 min   Other Standing Knee Exercises mini  squat homd 5 sec 10 times                  PT Short Term Goals - 10/13/16 1704      PT SHORT TERM GOAL #1   Title be independent in initial HEP to increase strength for prolongued standing   Baseline given 09/25/16   Time 4   Period Weeks   Status Achieved     PT SHORT TERM GOAL #2   Title able to maintain single leg stand on left LE for >3 sec for improved stabilty and reduced risk of falls.   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   Time 8   Period Weeks   Status New     PT LONG TERM GOAL #2   Title reduce FOTO to < or = to 39% limitation   Time 8   Period Weeks   Status New     PT LONG TERM GOAL #3   Title left knee ROM improved to > 107 degrees flexion and extension to neutral for improved functional gait and stairs   Time 8   Period Weeks   Status New     PT LONG TERM GOAL #4   Title able to stand and walk for 5 hours out of a 10 hour shift for improved function at 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  Patient is not allowed to wear a lead vest at work as per MD orders.  Patient continues to have weakness in left knee, ankle and hip. Patient reports she will place too much weight on left leg with pushing her hip to the left.  Patient has increased pain in left knee at end of work day.  Patient has slowly progressing her workout in PT at each visit.  Patient will benefit from skilled therapy to reduce pain and swelling in left knee to restore function.    Rehab Potential Good   Clinical Impairments Affecting Rehab Potential slow healing wound Rt knee replacement, chemo treatment every 3 months, multiple knee surgeries due to infection   PT Frequency 2x / week   PT Duration 4 weeks   PT Treatment/Interventions ADLs/Self Care Home Management;Biofeedback;Electrical Stimulation;Moist Heat;Patient/family education;Manual techniques;Manual lymph drainage;Neuromuscular re-education;Balance training;Therapeutic exercise;Therapeutic activities;Gait  training;Stair training;Taping;Vasopneumatic Device;Passive range of motion   PT Next Visit Plan LE strength as tolerated, stretching and ROM, ; left hip strength exercises   PT Home Exercise Plan progress as needed   Recommended Other Services inital cert signed on 2/0/9470   Consulted and Agree with Plan of Care Patient      Patient will benefit from skilled therapeutic intervention in order to improve the following deficits and impairments:  Abnormal gait, Decreased balance, Decreased activity tolerance, Difficulty walking, Decreased strength, Pain, Increased edema  Visit Diagnosis: Acute pain of left knee - Plan: PT plan of care cert/re-cert  Muscle weakness (generalized) - Plan: PT plan of care cert/re-cert  Stiffness of left knee, not elsewhere classified - Plan: PT plan of care cert/re-cert  Localized edema - Plan: PT plan of care cert/re-cert     Problem List Patient Active Problem List   Diagnosis Date Noted  . Physical exam 07/31/2016  . Allergic reaction 07/24/2016  . Drug rash 07/24/2016  . S/P total knee arthroplasty, right 05/31/2016  . Infection of prosthetic right knee joint ([place]) 05/31/2016  . Nonhealing surgical wound 05/28/2016  . Sepsis ([place]) 05/10/2016  . UTI (urinary tract infection) 05/10/2016  . Cellulitis of right knee 05/10/2016  . Blood clot in vein   . Wound dehiscence, surgical 04/12/2016  . 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 Vitamin D deficiency 01/25/2016  . Metastatic cancer to chest wall ([place]) 02/02/2015  . Primary osteoarthritis of right knee 02/01/2015  . Anemia 02/01/2015  . Frequent UTI 01/12/2014  . Sinus tachycardia 11/03/2013  . DOE (dyspnea on exertion) 11/03/2013  . Postoperative anemia due to acute blood loss 12/24/2012  .    09/23/2012  . Sinusitis 07/28/2012  . Neuropathy due to chemotherapeutic drug ([place]) 07/25/2011  . 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 Metastatic breast cancer ([place]) 11/12/2010  . Chronic anticoagulation  11/12/2010  . Anxiety, generalized 03/26/2008    Earlie Counts, PT 10/13/16 5:07 PM   [facility] [address], [place], [place], [address] Phone: [phone]   Fax:  [phone]  Name: RAYVIN ABID MRN: [id number] Date of Birth: 04-16-1956

## 2016-10-14 DIAGNOSIS — C792 Secondary malignant neoplasm of skin: Secondary | ICD-10-CM | POA: Diagnosis not present

## 2016-10-14 DIAGNOSIS — I5020 Unspecified systolic (congestive) heart failure: Secondary | ICD-10-CM | POA: Diagnosis not present

## 2016-10-14 DIAGNOSIS — Z9013 Acquired absence of bilateral breasts and nipples: Secondary | ICD-10-CM | POA: Diagnosis not present

## 2016-10-14 DIAGNOSIS — Z9221 Personal history of antineoplastic chemotherapy: Secondary | ICD-10-CM | POA: Diagnosis not present

## 2016-10-14 DIAGNOSIS — I340 Nonrheumatic mitral (valve) insufficiency: Secondary | ICD-10-CM | POA: Diagnosis not present

## 2016-10-14 DIAGNOSIS — C50412 Malignant neoplasm of upper-outer quadrant of left female breast: Secondary | ICD-10-CM | POA: Diagnosis not present

## 2016-10-14 DIAGNOSIS — Z923 Personal history of irradiation: Secondary | ICD-10-CM | POA: Diagnosis not present

## 2016-10-14 DIAGNOSIS — C50919 Malignant neoplasm of unspecified site of unspecified female breast: Secondary | ICD-10-CM | POA: Diagnosis not present

## 2016-10-14 DIAGNOSIS — Z5111 Encounter for antineoplastic chemotherapy: Secondary | ICD-10-CM | POA: Diagnosis not present

## 2016-10-14 DIAGNOSIS — Z5112 Encounter for antineoplastic immunotherapy: Secondary | ICD-10-CM | POA: Diagnosis not present

## 2016-10-16 ENCOUNTER — Ambulatory Visit: Payer: PRIVATE HEALTH INSURANCE | Admitting: Physical Therapy

## 2016-10-16 ENCOUNTER — Encounter: Payer: Self-pay | Admitting: Physical Therapy

## 2016-10-16 DIAGNOSIS — M25662 Stiffness of left knee, not elsewhere classified: Secondary | ICD-10-CM

## 2016-10-16 DIAGNOSIS — M25562 Pain in left knee: Secondary | ICD-10-CM | POA: Diagnosis not present

## 2016-10-16 DIAGNOSIS — M6281 Muscle weakness (generalized): Secondary | ICD-10-CM

## 2016-10-16 NOTE — Therapy (Signed)
[facility] [address], [place], [place], [address] Phone: [phone]   Fax:  [phone]  Physical Therapy Treatment  Patient Details  Name: [name] MRN: [id number] Date of Birth: Nov 02, 1955 Referring Provider: Dr. Susa Day  Encounter Date: 10/16/2016      PT End of Session - 10/16/16 1652    Visit Number 5   Number of Visits 6   Date for PT Re-Evaluation 11/20/16   Authorization Type W/C   Authorization - Visit Number 5   Authorization - Number of Visits 6   PT Start Time 0932   PT Stop Time 1652   PT Time Calculation (min) 39 min   Activity Tolerance Patient tolerated treatment well   Behavior During Therapy [facility] for tasks assessed/performed      Past Medical History:  Diagnosis Date  . 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 Allergic reaction 07/24/2016  . Arthritis KNEES   right knee, hx. past left knee replacemnt.  . Blood clot in vein    around Portacath right chest-tx. Eliquis about a yr., prior Lovenox.  . Blood dyscrasia   . Chronic anticoagulation HX  PORT-A-CATH CLOT   2010 - HAS BEEN ON LOVENOX SINCE THE CLOT  . Drug rash 07/24/2016  . Elevated blood pressure reading without diagnosis of hypertension    PT MONITORS HER B/P AT HOME  . Heart murmur   . 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 PONV (postoperative nausea and vomiting)   . Skin cancer of anterior chest BREAST CANCER PRIMARY W/ METS TO CHEST WALL SKIN CANCER--  CHEMOTHERAPY EVERY 3 WEEKS AT [facility]  . Status post skin flap graft    right chest wall with metastatis right anterior chest -no drainage or open wound.    Past Surgical History:  Procedure Laterality Date  . APPLICATION OF A-CELL OF BACK Right 07/31/2016   Procedure: CELLERATE COLLAGEN PLACEMENT;  Surgeon: Loel Lofty Dillingham, DO;   Location: [place];  Service: Plastics;  Laterality: Right;  . CHEST WALL TUMOR EXCISION    . hemotoma     evacuation left chest wall  . I&D KNEE WITH POLY EXCHANGE Right 05/28/2016   Procedure: IRRIGATION AND DEBRIDEMENT KNEE WOUND VAC PLACMENT;  Surgeon: Susa Day, MD;  [facility];  Service: Orthopedics;  Laterality: Right;  . I&D KNEE WITH POLY EXCHANGE Right 05/30/2016   Procedure: RADICAL SYNOVECTOMY,IRRIGATION AND DEBRIDEMENT KNEE WITH POLY EXCHANGE WITH ANTIBIOTIC BEADS, APPLICATION OF WOUND VAC;  Surgeon: Susa Day, MD;  [facility];  Service: Orthopedics;  Laterality: Right;  . INCISION AND DRAINAGE OF WOUND Right 07/31/2016   Procedure: IRRIGATION AND DEBRIDEMENT RIGHT KNEE  WOUND;  Surgeon: Loel Lofty Dillingham, DO;  Location: [place];  Service: Plastics;  Laterality: Right;  . IRRIGATION AND DEBRIDEMENT KNEE Right 04/12/2016   Procedure: IRRIGATION AND DEBRIDEMENT KNEE;  Surgeon: Nicholes Stairs, MD;  [facility];  Service: Orthopedics;  Laterality: Right;  . KNEE ARTHROSCOPY  02/13/2012   Procedure: ARTHROSCOPY KNEE;  Surgeon: Johnn Hai, MD;  Location: [facility];  Service: Orthopedics;  Laterality: Left;  WITH DEBRIDEMENt   . KNEE ARTHROSCOPY WITH LATERAL MENISECTOMY  02/13/2012   Procedure: KNEE ARTHROSCOPY WITH LATERAL MENISECTOMY;  Surgeon: Johnn Hai, MD;  Location: [facility];  Service: Orthopedics;;  partial  . LEFT MODIFIED RADICAL MASTECTOMY/ RIGHT TOTAL MASTECTOMY  11-13-2007   LEFT BREAST CANCER W/ AXILLARY LYMPH NODE METASTASIS AND POST NEOADJUVANT CHEMO  . PLACEMENT PORT-A-CATH  06/22/2007   right chest  . SKIN GRAFT     chest-post tumor removal, second occurrence of cancer" 2015 surgery for Flap 2015"  . TOTAL KNEE ARTHROPLASTY Left 09/23/2012   Procedure: LEFT TOTAL KNEE ARTHROPLASTY;  Surgeon: Javier Docker, MD;  [facility];  Service: Orthopedics;  Laterality: Left;  . TOTAL KNEE ARTHROPLASTY Right  03/20/2016   Procedure: RIGHT TOTAL KNEE ARTHROPLASTY;  Surgeon: Jene Every, MD;  [facility];  Service: Orthopedics;  Laterality: Right;  . TRANSTHORACIC ECHOCARDIOGRAM  11-18-2011  DR BENSIMHON (ECHO EVERY 3 MONTHS)   HX CHEMO INDUCED CARDIOTOXICITY/  LVSF NORMAL/ EF 55-50%/ MILD MITRIAL VALVE REGURG./ MILDLY INCREASED SYSTOLIC PRESSURE OF PULMONARY ARTERIES  . VASCULAR SURGERY Right    portacath chest    There were no vitals filed for this visit.      Subjective Assessment - 10/16/16 1623    Subjective I was sore after last visit. The ice worked   Pertinent History slow healing wound Rt knee replacement, chemo treatment every 3 months, multiple knee surgeries due to infection   Limitations Standing;Walking   Patient Stated Goals Increased strength, extension motion, improve balance, walking   Currently in Pain? Yes   Pain Score 5    Pain Location Knee   Pain Orientation Left   Pain Descriptors / Indicators Aching   Pain Type Acute pain   Pain Onset More than a month ago   Pain Frequency Intermittent   Aggravating Factors  end of work day, walking, standing and as the day progresses   Pain Relieving Factors rest   Effect of Pain on Daily Activities wants to get back to getting up more at work   Multiple Pain Sites No                         [facility] Adult PT Treatment/Exercise - 10/16/16 0001      Knee/Hip Exercises: Stretches   Active Hamstring Stretch Left;2 reps;30 seconds   Active Hamstring Stretch Limitations sitting   Gastroc Stretch 2 reps;30 seconds  standing     Knee/Hip Exercises: Aerobic   Stationary Bike 7 min level 3     Knee/Hip Exercises: Standing   Hip ADduction Strengthening;Left;2 sets;10 reps  no weight   Lateral Step Up 1 set;10 reps;Hand Hold: 2;Step Height: 6"  going to left   Forward Step Up 1 set;10 reps;Left;Hand Hold: 2;Step Height: 6"  work on full contraction of gluteals and quad   Rebounder all 3 ways 1 min   Other  Standing Knee Exercises mini  squat hold 5 sec 10 times with verbal cues for correct posture     Knee/Hip Exercises: Seated   Long Arc Quad Left;2 sets;10 reps;Weights   Long Arc Quad Weight 3 lbs.   Hamstring Curl 2 sets;Left;Strengthening;15 reps  3#                  PT Short Term Goals - 10/13/16 1704      PT SHORT TERM GOAL #1   Title be independent in initial HEP to increase strength for prolongued standing   Baseline given 09/25/16   Time 4   Period Weeks   Status Achieved     PT SHORT TERM GOAL #2   Title  able to maintain single leg stand on left LE for >3 sec for improved stabilty and reduced risk of falls.   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   Time 8   Period Weeks   Status New     PT LONG TERM GOAL #2   Title reduce FOTO to < or = to 39% limitation   Time 8   Period Weeks   Status New     PT LONG TERM GOAL #3   Title left knee ROM improved to > 107 degrees flexion and extension to neutral for improved functional gait and stairs   Time 8   Period Weeks   Status New     PT LONG TERM GOAL #4   Title able to stand and walk for 5 hours out of a 10 hour shift for improved function at 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 Patient was able to do lateral step up on 6 inch step today without difficulty compared to last visit.  Patient will benefit from skilled therapy t oreduce pain and swelling in left knee to restore function.    Rehab Potential Good   Clinical Impairments Affecting Rehab Potential slow healing wound Rt knee replacement, chemo treatment every 3 months, multiple knee surgeries due to infection   PT Frequency 2x / week   PT Duration 4 weeks   PT Treatment/Interventions ADLs/Self Care Home Management;Biofeedback;Electrical Stimulation;Moist Heat;Patient/family education;Manual techniques;Manual lymph drainage;Neuromuscular re-education;Balance training;Therapeutic exercise;Therapeutic activities;Gait training;Stair training;Taping;Vasopneumatic Device;Passive range of motion   PT Next Visit Plan LE strength as tolerated, stretching and ROM, ; left hip strength exercises; see if more visit are approved. Next visit is last one approved   PT Home Exercise Plan progress as needed   Consulted and Agree with Plan of Care Patient      Patient will benefit from skilled therapeutic intervention in order to improve the following deficits and impairments:  Abnormal gait, Decreased balance, Decreased activity tolerance, Difficulty walking, Decreased strength, Pain, Increased edema  Visit Diagnosis: Acute pain of left knee  Muscle weakness (generalized)  Stiffness of left knee, not elsewhere classified     Problem List Patient Active Problem List  Diagnosis Date Noted  . Physical exam 07/31/2016  . Allergic reaction 07/24/2016  . Drug rash 07/24/2016  . S/P total knee arthroplasty, right 05/31/2016  . Infection of prosthetic right knee joint ([place]) 05/31/2016  . Nonhealing surgical wound 05/28/2016  . Sepsis ([place])  05/10/2016  . UTI (urinary tract infection) 05/10/2016  . Cellulitis of right knee 05/10/2016  . Blood clot in vein   . Wound dehiscence, surgical 04/12/2016  . 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 Vitamin D deficiency 01/25/2016  . 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 Primary osteoarthritis of right knee 02/01/2015  . Anemia 02/01/2015  . Frequent UTI 01/12/2014  . Sinus tachycardia 11/03/2013  . DOE (dyspnea on exertion) 11/03/2013  . Postoperative anemia due to acute blood loss 12/24/2012  .    09/23/2012  . Sinusitis 07/28/2012  . Neuropathy due to chemotherapeutic drug ([place]) 07/25/2011  . 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 Metastatic breast cancer ([place]) 11/12/2010  . Chronic anticoagulation 11/12/2010  . Anxiety, generalized 03/26/2008    Earlie Counts, PT 10/16/16 4:56 PM   [facility] [address], [place], [place], [address] Phone: [phone]   Fax:  [phone]  Name: [name] MRN: [id number] Date of Birth: 1955/06/07

## 2016-10-23 ENCOUNTER — Ambulatory Visit: Payer: Self-pay | Admitting: Physical Therapy

## 2016-10-23 MED FILL — DARIFENACIN ER 15 MG TABLET: 15 | 30 days supply | Qty: 30 | Fill #10

## 2016-10-27 MED FILL — ELIQUIS 5 MG TABLET: 5 | 30 days supply | Qty: 60 | Fill #0

## 2016-10-27 NOTE — Anesthesia Postprocedure Evaluation (Signed)
Anesthesia Post Note  Patient: [name]  Procedure(s) Performed: Procedure(s) (LRB): IRRIGATION AND DEBRIDEMENT RIGHT KNEE  WOUND (Right) CELLERATE COLLAGEN PLACEMENT (Right)     Anesthesia Post Evaluation  Last Vitals:  Vitals:   07/31/16 1651 07/31/16 1700  BP: 122/71   Pulse: 91 96  Resp: 13 17  Temp:  36.7 C    Last Pain:  Vitals:   07/31/16 1600  TempSrc:   PainSc: 0-No pain                 Darleene Cumpian S

## 2016-10-27 NOTE — Addendum Note (Signed)
Addendum  created 10/27/16 1209 by Myrtie Soman, MD   Sign clinical note

## 2016-10-28 DIAGNOSIS — T8131XD Disruption of external operation (surgical) wound, not elsewhere classified, subsequent encounter: Secondary | ICD-10-CM | POA: Diagnosis not present

## 2016-10-29 ENCOUNTER — Other Ambulatory Visit (HOSPITAL_COMMUNITY)
Admission: RE | Admit: 2016-10-29 | Discharge: 2016-10-29 | Disposition: A | Discharge status: Home / Self Care | Payer: 59 | Source: Other Acute Inpatient Hospital | Attending: Specialist | Admitting: Specialist

## 2016-10-29 ENCOUNTER — Telehealth: Payer: Self-pay | Admitting: Internal Medicine

## 2016-10-29 DIAGNOSIS — Z96651 Presence of right artificial knee joint: Secondary | ICD-10-CM | POA: Insufficient documentation

## 2016-10-29 NOTE — Telephone Encounter (Signed)
I received a phone call from Dr. Erasmo Score today. He has seen [name] in the office today after she developed some acute swelling and redness of her right prosthetic knee. She has been off of her antibiotics for a few months. When last seen here she had an allergic reaction to amoxicillin and was given linezolid which she took for about one month. Dr. Lindell Noe is not sure if her current acute problem is recurrent infection or a bleed related to her anticoagulation. He is going to do some incision and drainage in the office and submit specimens for culture. I suggested restarting linezolid after that for up to one month. He is going to put her out of work. I will ask her to come back for a visit with me soon.

## 2016-11-04 ENCOUNTER — Ambulatory Visit: Payer: PRIVATE HEALTH INSURANCE | Attending: Specialist | Admitting: Physical Therapy

## 2016-11-04 DIAGNOSIS — M25662 Stiffness of left knee, not elsewhere classified: Secondary | ICD-10-CM | POA: Insufficient documentation

## 2016-11-04 DIAGNOSIS — R600 Localized edema: Secondary | ICD-10-CM | POA: Insufficient documentation

## 2016-11-04 DIAGNOSIS — Z5112 Encounter for antineoplastic immunotherapy: Secondary | ICD-10-CM | POA: Diagnosis not present

## 2016-11-04 DIAGNOSIS — C792 Secondary malignant neoplasm of skin: Secondary | ICD-10-CM | POA: Diagnosis not present

## 2016-11-04 DIAGNOSIS — M25562 Pain in left knee: Secondary | ICD-10-CM

## 2016-11-04 DIAGNOSIS — M6281 Muscle weakness (generalized): Secondary | ICD-10-CM | POA: Insufficient documentation

## 2016-11-04 DIAGNOSIS — C50412 Malignant neoplasm of upper-outer quadrant of left female breast: Secondary | ICD-10-CM | POA: Diagnosis not present

## 2016-11-04 MED FILL — ZOLPIDEM TARTRATE 5 MG TAB: 5 | 30 days supply | Qty: 30 | Fill #1

## 2016-11-04 NOTE — Therapy (Signed)
[facility] [address], [place], [place], [address] Phone: [phone]   Fax:  [phone]  Physical Therapy Treatment  Patient Details  Name: [name] MRN: [id number] Date of Birth: 03/26/1956 Referring Provider: Dr. Susa Day  Encounter Date: 11/04/2016      PT End of Session - 11/04/16 1647    Visit Number 6   Authorization Type W/C   Authorization - Visit Number 6   PT Start Time 4270   PT Stop Time 1645   PT Time Calculation (min) 32 min   Activity Tolerance Patient tolerated treatment well   Behavior During Therapy [facility] for tasks assessed/performed      Past Medical History:  Diagnosis Date  . 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 Allergic reaction 07/24/2016  . Arthritis KNEES   right knee, hx. past left knee replacemnt.  . Blood clot in vein    around Portacath right chest-tx. Eliquis about a yr., prior Lovenox.  . Blood dyscrasia   . Chronic anticoagulation HX  PORT-A-CATH CLOT   2010 - HAS BEEN ON LOVENOX SINCE THE CLOT  . Drug rash 07/24/2016  . Elevated blood pressure reading without diagnosis of hypertension    PT MONITORS HER B/P AT HOME  . Heart murmur   . 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 PONV (postoperative nausea and vomiting)   . Skin cancer of anterior chest BREAST CANCER PRIMARY W/ METS TO CHEST WALL SKIN CANCER--  CHEMOTHERAPY EVERY 3 WEEKS AT [facility]  . Status post skin flap graft    right chest wall with metastatis right anterior chest -no drainage or open wound.    Past Surgical History:  Procedure Laterality Date  . APPLICATION OF A-CELL OF BACK Right 07/31/2016   Procedure: CELLERATE COLLAGEN PLACEMENT;  Surgeon: Loel Lofty Dillingham, DO;  Location: [facility];  Service: Plastics;  Laterality: Right;  . CHEST WALL TUMOR EXCISION    .  hemotoma     evacuation left chest wall  . I&D KNEE WITH POLY EXCHANGE Right 05/28/2016   Procedure: IRRIGATION AND DEBRIDEMENT KNEE WOUND VAC PLACMENT;  Surgeon: Susa Day, MD;  Location: [facility];  Service: Orthopedics;  Laterality: Right;  . I&D KNEE WITH POLY EXCHANGE Right 05/30/2016   Procedure: RADICAL SYNOVECTOMY,IRRIGATION AND DEBRIDEMENT KNEE WITH POLY EXCHANGE WITH ANTIBIOTIC BEADS, APPLICATION OF WOUND VAC;  Surgeon: Susa Day, MD;  Location[facility];  Service: Orthopedics;  Laterality: Right;  . INCISION AND DRAINAGE OF WOUND Right 07/31/2016   Procedure: IRRIGATION AND DEBRIDEMENT RIGHT KNEE  WOUND;  Surgeon: Loel Lofty Dillingham, DO;  Location: [facility];  Service: Plastics;  Laterality: Right;  . IRRIGATION AND DEBRIDEMENT KNEE Right 04/12/2016   Procedure: IRRIGATION AND DEBRIDEMENT KNEE;  Surgeon: Nicholes Stairs, MD;  Location: [facility];  Service: Orthopedics;  Laterality: Right;  . KNEE ARTHROSCOPY  02/13/2012   Procedure: ARTHROSCOPY KNEE;  Surgeon: Johnn Hai, MD;  Location: [facility];  Service: Orthopedics;  Laterality: Left;  WITH DEBRIDEMENt   . KNEE ARTHROSCOPY WITH LATERAL MENISECTOMY  02/13/2012   Procedure: KNEE ARTHROSCOPY WITH LATERAL MENISECTOMY;  Surgeon: Johnn Hai, MD;  Location: [facility];  Service: Orthopedics;;  partial  . LEFT MODIFIED RADICAL MASTECTOMY/ RIGHT TOTAL MASTECTOMY  11-13-2007   LEFT BREAST CANCER  W/ AXILLARY LYMPH NODE METASTASIS AND POST NEOADJUVANT CHEMO  . PLACEMENT PORT-A-CATH  06/22/2007   right chest  . SKIN GRAFT     chest-post tumor removal, second occurrence of cancer" 2015 surgery for Flap 2015"  . TOTAL KNEE ARTHROPLASTY Left 09/23/2012   Procedure: LEFT TOTAL KNEE ARTHROPLASTY;  Surgeon: Johnn Hai, MD;  [facility];  Service: Orthopedics;  Laterality: Left;  . TOTAL KNEE ARTHROPLASTY Right 03/20/2016   Procedure: RIGHT TOTAL KNEE ARTHROPLASTY;  Surgeon: Susa Day, MD;   [facility];  Service: Orthopedics;  Laterality: Right;  . TRANSTHORACIC ECHOCARDIOGRAM  11-18-2011  DR BENSIMHON (ECHO EVERY 3 MONTHS)   HX CHEMO INDUCED CARDIOTOXICITY/  LVSF NORMAL/ EF 55-50%/ MILD MITRIAL VALVE REGURG./ MILDLY INCREASED SYSTOLIC PRESSURE OF PULMONARY ARTERIES  . VASCULAR SURGERY Right    portacath chest    There were no vitals filed for this visit.      Subjective Assessment - 11/04/16 1615    Subjective knee is stiff but doing okay.  right knee has been more painful recently.  reports left knee pain is 95% improved.  feels ready to discharge today.  reports walking or standing at least 50% of 10 hour shift.   Pertinent History slow healing wound Rt knee replacement, chemo treatment every 3 months, multiple knee surgeries due to infection   Patient Stated Goals Increased strength, extension motion, improve balance, walking   Currently in Pain? Yes   Pain Score 1    Pain Location Knee   Pain Orientation Left   Pain Descriptors / Indicators Aching   Pain Type Acute pain   Pain Onset More than a month ago   Pain Frequency Intermittent   Aggravating Factors  end of work day, walking, standing as day progresses   Pain Relieving Factors rest   Multiple Pain Sites Yes   Pain Score 5   Pain Location Knee   Pain Orientation Right   Pain Descriptors / Indicators Aching;Tightness   Pain Type Acute pain   Pain Onset 1 to 4 weeks ago   Pain Frequency Intermittent   Aggravating Factors  sitting   Pain Relieving Factors ice            [facility] PT Assessment - 11/04/16 1624      Observation/Other Assessments   Focus on Therapeutic Outcomes (FOTO)  75 (25% limitation)     AROM   Left Knee Extension 0   Left Knee Flexion 111                     [facility] Adult PT Treatment/Exercise - 11/04/16 1619      Ambulation/Gait   Stairs Yes   Gait Comments verbally reviewed safety with stairs; recommending pt negotiate stairs safest at home, but can practice  step ups with LLE (weaker leg) at bottom step to continue to increase strength; pt verbalized understanding.     Knee/Hip Exercises: Aerobic   Recumbent Bike L1 x 7 min     Knee/Hip Exercises: Standing   Terminal Knee Extension Limitations TKE bil x10 each   Hip Abduction Both;10 reps;Knee straight   Abduction Limitations red theraband   Hip Extension Both;10 reps;Knee straight   Extension Limitations red theraband                PT Education - 11/04/16 1646    Education provided Yes   Education Details added red theraband to standing hip exercises; safety with stairs   Person(s) Educated Patient  Methods Explanation;Demonstration   Comprehension Verbalized understanding          PT Short Term Goals - 10/13/16 1704      PT SHORT TERM GOAL #1   Title be independent in initial HEP to increase strength for prolongued standing   Baseline given 09/25/16   Time 4   Period Weeks   Status Achieved     PT SHORT TERM GOAL #2   Title able to maintain single leg stand on left LE for >3 sec for improved stabilty and reduced risk of falls.   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   Status Achieved     PT LONG TERM GOAL #2   Title reduce FOTO to < or = to 39% limitation   Status Achieved     PT LONG TERM GOAL #3   Title left knee ROM improved to > 107 degrees flexion and extension to  neutral for improved functional gait and stairs   Status Achieved     PT LONG TERM GOAL #4   Title able to stand and walk for 5 hours out of a 10 hour shift for improved function at work to 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  Reports R knee pain has increased, but overall L knee pain is 95% better.  Will d/c PT at this time.   Clinical Impairments Affecting Rehab Potential slow healing wound Rt knee replacement, chemo treatment every 3 months, multiple knee surgeries due to infection   PT Next Visit Plan d/c PT today   Consulted and Agree with Plan of Care Patient      Patient will benefit from skilled therapeutic intervention in order to improve the following deficits and impairments:  Abnormal gait, Decreased balance, Decreased activity tolerance, Difficulty walking, Decreased strength, Pain, Increased edema  Visit Diagnosis: Acute pain of left knee  Muscle weakness (generalized)  Stiffness of left knee, not elsewhere classified  Localized edema     Problem List Patient Active Problem List   Diagnosis Date Noted  . Physical exam 07/31/2016  . Allergic reaction 07/24/2016  . Drug rash 07/24/2016  . S/P total knee arthroplasty, right 05/31/2016  . Infection of prosthetic right knee joint ([place]) 05/31/2016  . Nonhealing surgical wound 05/28/2016  . Sepsis ([place]) 05/10/2016  .  UTI (urinary tract infection) 05/10/2016  . Cellulitis of right knee 05/10/2016  . Blood clot in vein   . Wound dehiscence, surgical 04/12/2016  . 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 Vitamin D deficiency 01/25/2016  . 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 Primary osteoarthritis of right knee 02/01/2015  . Anemia 02/01/2015   . Frequent UTI 01/12/2014  . Sinus tachycardia 11/03/2013  . DOE (dyspnea on exertion) 11/03/2013  . Postoperative anemia due to acute blood loss 12/24/2012  .    09/23/2012  . Sinusitis 07/28/2012  . Neuropathy due to chemotherapeutic drug ([place]) 07/25/2011  . 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 Metastatic breast cancer ([place]) 11/12/2010  . Chronic anticoagulation 11/12/2010  . Anxiety, generalized 03/26/2008      Laureen Abrahams, PT, DPT 11/04/16 4:50 PM    [facility] [address] [place], [place], [address] Phone: [phone]   Fax:  [phone]  Name: TATIANNA IBBOTSON MRN: [id number] Date of Birth: [birth year]      PHYSICAL THERAPY DISCHARGE SUMMARY  Visits from Start of Care: 6  Current functional level related to goals / functional outcomes: See above   Remaining deficits: Recent exacerbation of Rt knee pain; all goals met for Lt knee   Education / Equipment: HEP, theraband  Plan: Patient agrees to discharge.  Patient goals were met. Patient is being discharged due to meeting the stated rehab goals.  ?????      Laureen Abrahams, PT, DPT 11/04/16 4:51 PM   [facility] [address] [place], [place] [address]  [phone] (office) [phone] (fax)

## 2016-11-05 DIAGNOSIS — Z96651 Presence of right artificial knee joint: Secondary | ICD-10-CM | POA: Diagnosis not present

## 2016-11-06 ENCOUNTER — Ambulatory Visit (INDEPENDENT_AMBULATORY_CARE_PROVIDER_SITE_OTHER): Payer: 59 | Admitting: Internal Medicine

## 2016-11-06 ENCOUNTER — Encounter: Payer: Self-pay | Admitting: Internal Medicine

## 2016-11-06 DIAGNOSIS — T8131XD Disruption of external operation (surgical) wound, not elsewhere classified, subsequent encounter: Secondary | ICD-10-CM | POA: Diagnosis not present

## 2016-11-06 DIAGNOSIS — T8453XD Infection and inflammatory reaction due to internal right knee prosthesis, subsequent encounter: Secondary | ICD-10-CM | POA: Diagnosis not present

## 2016-11-06 MED ORDER — METRONIDAZOLE 500 MG PO TABS: 500.0000 mg | ORAL_TABLET | Freq: Three times a day (TID) | ORAL | 0 refills | Status: DC

## 2016-11-06 NOTE — Assessment & Plan Note (Signed)
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 She may have some infection of a superficial hematoma. I will treat her with a 10 day course of metronidazole. She does not drink alcohol so there is no problem with the drug drug interaction causing nausea and vomiting. I asked her to send me a MyChart message in about 2 weeks letting me know how she is doing.

## 2016-11-06 NOTE — Progress Notes (Signed)
[facility] for Infectious Disease  Patient Active Problem List   Diagnosis Date Noted  . S/P total knee arthroplasty, right 05/31/2016    Priority: High  . Infection of prosthetic right knee joint ([place]) 05/31/2016    Priority: High  . Wound dehiscence, surgical 04/12/2016    Priority: High  . 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 Metastatic breast cancer ([place]) 11/12/2010    Priority: Medium  . Physical exam 07/31/2016  . Allergic reaction 07/24/2016  . Drug rash 07/24/2016  . Nonhealing surgical wound 05/28/2016  . Sepsis ([place]) 05/10/2016  . UTI (urinary tract infection) 05/10/2016  . Cellulitis of right knee 05/10/2016  . Blood clot in vein   . 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 Vitamin D deficiency 01/25/2016  . Primary osteoarthritis of right knee 02/01/2015  . Anemia 02/01/2015  . Frequent UTI 01/12/2014  . Sinus tachycardia 11/03/2013  . DOE (dyspnea on exertion) 11/03/2013  . Postoperative anemia due to acute blood loss 12/24/2012  .    09/23/2012  . Sinusitis 07/28/2012  . Neuropathy due to chemotherapeutic drug ([place]) 07/25/2011  . 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 Chronic anticoagulation 11/12/2010  . Anxiety, generalized 03/26/2008    Patient's Medications  New Prescriptions   METRONIDAZOLE (FLAGYL) 500 MG TABLET    Take 1 tablet (500 mg total) by mouth 3 (three) times daily.  Previous Medications   ACETAMINOPHEN (TYLENOL) 325 MG TABLET    Take 2 tablets (650 mg total) by mouth every 6 (six) hours as needed for mild pain (or Fever >/= 101).   ADO-TRASTUZUMAB EMTANSINE (KADCYLA IV)    Inject into the vein every 21 ( twenty-one) days.   APIXABAN (ELIQUIS) 5 MG TABS TABLET    Take 5 mg by mouth 2 (two) times daily. Will stop prior to proceure   CELECOXIB (CELEBREX) 200 MG CAPSULE    Take 1 capsule (200 mg total) by mouth 2 (two) times daily.   CHOLECALCIFEROL (VITAMIN D3) 2000 UNITS TABS    Take 1 tablet by mouth  4 (four) times a week.    DARIFENACIN (ENABLEX) 15 MG 24 HR TABLET    Take 15 mg by mouth daily.   DOCUSATE SODIUM (COLACE) 100 MG CAPSULE    Take 1 capsule (100 mg total) by mouth 2 (two) times daily as needed for mild constipation.   GABAPENTIN (NEURONTIN) 300 MG CAPSULE    TAKE 1 CAPSULE BY MOUTH 3 TIMES DAILY   LINEZOLID (ZYVOX) 600 MG TABLET    Take 1 tablet (600 mg total) by mouth 2 (two) times daily.   ONDANSETRON (ZOFRAN) 8 MG TABLET    Take 1 tablet (8 mg total) by mouth every 8 (eight) hours as needed for nausea.   OXYCODONE (OXY IR/ROXICODONE) 5 MG IMMEDIATE RELEASE TABLET    Take 1 tablet (5 mg total) by mouth every 4 (four) hours as needed for breakthrough pain.   POLYETHYLENE GLYCOL (MIRALAX / GLYCOLAX) PACKET    Take 17 g by mouth daily.   TRAMADOL (ULTRAM) 50 MG TABLET    TAKE 1 TABLET BY MOUTH EVERY 6-8 HOURS AS NEEDED FOR PAIN   TRIAMCINOLONE OINTMENT (KENALOG) 0.1 %    Apply 1 application topically 2 (two) times daily.   ZOLPIDEM (AMBIEN) 5 MG TABLET    TAKE 1 TABLET BY MOUTH ONCE DAILY AT BEDTIME AS NEEDED FOR SLEEP  Modified Medications   No medications  on file  Discontinued Medications   No medications on file    Subjective: [name] is in with her husband for her routine follow-up visit. She is a 61 y.o.femalewith DJD who underwent right total knee arthroplasty on 03/20/2016. She did well initially but suffered a fall and had dehiscence of her wound. She underwent incision and drainage and closure of the wound on 04/12/2016. She was discharged on a short course of cephalexin. She had persistent wound drainage and was readmitted on 05/10/2016. She had some cellulitis around the wound. She was treated with IV vancomycin and aztreonam then transitioned to oral ciprofloxacin for presumed UTI although she did not have any symptoms of a UTI. She took the ciprofloxacin for 7 days. She had persistent wound drainage and low-grade fevers leading to readmission on 05/28/2016. She  underwent incision and drainage and poly-exchange and had a VAC wound dressing placed. Synovial fluid cultures grew Enterococcus faecalis. She is currently undergoing chemotherapy at [facility] for recurrence of her breast cancer on her chest wall. She has a history of allergy to amoxicillin. She developed a rash when she took it as a young girl. She also took it again in her late 52s and developed a diffuse, pruritic red rash. She was discharged on IV vancomycin and completed 46 days of therapy on 07/15/2016. She was seen by her allergist and tolerated a test dose of oral penicillin and then started oral amoxicillin. Unfortunately she developed a diffuse pruritic rash shortly after starting amoxicillin. She was seen by my partner, Dr. Tommy Medal, on 07/24/2016 and switch to oral linezolid. She took that for about 6 weeks then stopped it because she was concerned about potential side effects. She had surgery on her right knee by Dr. Audelia Hives on 07/31/2016. No cultures were obtained. She did well until recently when she bumped her knee on a table. She developed a hematoma (she is on eloquent). She was seen by her orthopedic surgeon, Dr. Erasmo Score, on 10/29/2016. He opened up the hematoma and obtained a swab culture. That culture appears to be growing an anaerobe. She is not on an antibiotic currently. She says that her knee is looking much better. The area of the hematoma is less red and swollen. She is also having much less dependent edema in her lower leg. She continues to receive chemotherapy for her recurrent breast cancer.  Review of Systems: Review of Systems  Constitutional: Positive for fever and malaise/fatigue. Negative for chills and diaphoresis.       She has low-grade fevers for several days after her chemotherapy that resolves spontaneously.  Gastrointestinal: Negative for abdominal pain, diarrhea, nausea and vomiting.  Musculoskeletal: Positive for joint pain.     Past Medical History:  Diagnosis Date  . 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 Allergic reaction 07/24/2016  . Arthritis KNEES   right knee, hx. past left knee replacemnt.  . Blood clot in vein    around Portacath right chest-tx. Eliquis about a yr., prior Lovenox.  . Blood dyscrasia   . Chronic anticoagulation HX  PORT-A-CATH CLOT   2010 - HAS BEEN ON LOVENOX SINCE THE CLOT  . Drug rash 07/24/2016  . Elevated blood pressure reading without diagnosis of hypertension    PT MONITORS HER B/P AT HOME  . Heart murmur   . 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 PONV (postoperative nausea and vomiting)   . Skin cancer of anterior chest BREAST CANCER PRIMARY W/ METS TO CHEST WALL SKIN CANCER--  CHEMOTHERAPY EVERY 3 WEEKS AT [facility]  . Status post skin flap graft    right chest wall with metastatis right anterior chest -no drainage or open wound.    Social History  Substance Use Topics  . Smoking status: Never Smoker  . Smokeless tobacco: Never Used  . Alcohol use No    Family History  Problem Relation Age of Onset  . Heart disease Mother        due to mitral valve regurgiation,   . Cancer Mother        breast  . Allergic rhinitis Mother   . Parkinsonism Father   . Allergic rhinitis Father   . Migraines Father   . Alcohol abuse Paternal Grandfather   . Angioedema Neg Hx   . Asthma Neg Hx   . Eczema Neg Hx     Allergies  Allergen Reactions  . Penicillins Hives, Itching and Rash    Has patient had a PCN reaction causing immediate rash, facial/tongue/throat swelling, SOB or lightheadedness with hypotension: Yes Has patient had a PCN reaction causing severe rash involving mucus membranes or skin necrosis: No Has patient had a PCN reaction that required hospitalization No Has patient had a PCN reaction occurring within the last 10 years:  No If all of the above answers are "NO", then may proceed with Cephalosporin use.   Denies airway involvement   . Other Rash    STERI STRIPS - Blisters  . Tape Hives    Can tolerate paper tape    Objective: Vitals:   11/06/16 1134  BP: 111/66  Pulse: (!) 120  Temp: 99.9 F (37.7 C)  TempSrc: Oral  Weight: 160 lb (72.6 kg)  Height: '5\' 7"'$  (1.702 m)   Body mass index is 25.06 kg/m.  Physical Exam  Constitutional:  She is in reasonably good spirits.  Musculoskeletal:  Her right knee is chronically swollen and warm. There is a small scabbed area in the central portion of her previous incision. There are 2 small areas medial to the incision where she describes the hematoma. There is no drainage or odor.    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 She may have some infection of a superficial hematoma. I will treat her with a 10 day course of metronidazole. She does not drink alcohol so there is no problem with the drug drug interaction causing nausea and vomiting. I asked her to send me a MyChart message in about 2 weeks letting me know how she is doing.      Relevant Medications   metroNIDAZOLE (FLAGYL) 500 MG tablet   Wound dehiscence, surgical   Relevant Medications   metroNIDAZOLE (FLAGYL) 500 MG tablet       Michel Bickers, MD [facility] [place] [phone] pager   [phone] cell 11/06/2016, 1:28 PM

## 2016-11-10 LAB — ANAEROBIC CULTURE

## 2016-11-14 DIAGNOSIS — H524 Presbyopia: Secondary | ICD-10-CM | POA: Diagnosis not present

## 2016-11-16 ENCOUNTER — Encounter: Payer: Self-pay | Admitting: Internal Medicine

## 2016-11-17 ENCOUNTER — Telehealth: Payer: Self-pay | Admitting: Internal Medicine

## 2016-11-17 ENCOUNTER — Other Ambulatory Visit: Payer: Self-pay | Admitting: Internal Medicine

## 2016-11-17 DIAGNOSIS — Z96651 Presence of right artificial knee joint: Secondary | ICD-10-CM | POA: Diagnosis not present

## 2016-11-17 DIAGNOSIS — T8131XD Disruption of external operation (surgical) wound, not elsewhere classified, subsequent encounter: Secondary | ICD-10-CM

## 2016-11-17 MED ORDER — METRONIDAZOLE 500 MG PO TABS: 500.0000 mg | ORAL_TABLET | Freq: Three times a day (TID) | ORAL | 0 refills | Status: DC

## 2016-11-17 NOTE — Telephone Encounter (Signed)
Yes. I will send a 2 week supply to your pharmacy.  [name] ===View-only below this line===   ----- Message -----    From: Margie Ege. [place]    Sent: 11/16/2016  8:46 PM EDT      To: Michel Bickers, MD Subject: Non-Urgent Medical Question  Hello, my one area on my right knee had white pus coming out of it this weekend. Should I do another round of antibiotic? Thanks, [name] 1955/06/05

## 2016-11-19 ENCOUNTER — Ambulatory Visit (HOSPITAL_COMMUNITY)
Admission: RE | Admit: 2016-11-19 | Discharge: 2016-11-19 | Disposition: A | Discharge status: Home / Self Care | Payer: 59 | Source: Ambulatory Visit | Attending: Internal Medicine | Admitting: Internal Medicine

## 2016-11-19 ENCOUNTER — Encounter (HOSPITAL_COMMUNITY): Payer: Self-pay | Admitting: Internal Medicine

## 2016-11-19 ENCOUNTER — Ambulatory Visit: Payer: 59 | Attending: Internal Medicine | Admitting: Physical Therapy

## 2016-11-19 DIAGNOSIS — T8484XA Pain due to internal orthopedic prosthetic devices, implants and grafts, initial encounter: Secondary | ICD-10-CM | POA: Diagnosis not present

## 2016-11-19 DIAGNOSIS — M25612 Stiffness of left shoulder, not elsewhere classified: Secondary | ICD-10-CM | POA: Diagnosis not present

## 2016-11-19 DIAGNOSIS — Z96651 Presence of right artificial knee joint: Secondary | ICD-10-CM | POA: Diagnosis not present

## 2016-11-19 DIAGNOSIS — I371 Nonrheumatic pulmonary valve insufficiency: Secondary | ICD-10-CM | POA: Diagnosis not present

## 2016-11-19 DIAGNOSIS — M25512 Pain in left shoulder: Secondary | ICD-10-CM

## 2016-11-19 DIAGNOSIS — M6281 Muscle weakness (generalized): Secondary | ICD-10-CM | POA: Diagnosis not present

## 2016-11-19 DIAGNOSIS — T8453XA Infection and inflammatory reaction due to internal right knee prosthesis, initial encounter: Secondary | ICD-10-CM | POA: Diagnosis not present

## 2016-11-19 DIAGNOSIS — C7989 Secondary malignant neoplasm of other specified sites: Secondary | ICD-10-CM | POA: Diagnosis not present

## 2016-11-19 DIAGNOSIS — Z471 Aftercare following joint replacement surgery: Secondary | ICD-10-CM | POA: Diagnosis not present

## 2016-11-19 DIAGNOSIS — R29898 Other symptoms and signs involving the musculoskeletal system: Secondary | ICD-10-CM

## 2016-11-19 DIAGNOSIS — I081 Rheumatic disorders of both mitral and tricuspid valves: Secondary | ICD-10-CM | POA: Insufficient documentation

## 2016-11-19 NOTE — Progress Notes (Signed)
  Echocardiogram 2D Echocardiogram has been performed.  [name] M 11/19/2016, 10:59 AM

## 2016-11-19 NOTE — Therapy (Signed)
[place], [place], [address] Phone: [phone]   Fax:  [phone]  Physical Therapy Evaluation  Patient Details  Name: [name] MRN: [id number] Date of Birth: 07-Apr-1956 Referring Provider: Dr. Ysidro Evert Force at [facility]  Encounter Date: 11/19/2016      PT End of Session - 11/19/16 1647    Visit Number 1   Number of Visits 17   Date for PT Re-Evaluation 01/23/17   PT Start Time 7902   PT Stop Time 1610   PT Time Calculation (min) 55 min   Activity Tolerance Patient tolerated treatment well   Behavior During Therapy [facility] for tasks assessed/performed      Past Medical History:  Diagnosis Date  . 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 Allergic reaction 07/24/2016  . Arthritis KNEES   right knee, hx. past left knee replacemnt.  . Blood clot in vein    around Portacath right chest-tx. Eliquis about a yr., prior Lovenox.  . Blood dyscrasia   . Chronic anticoagulation HX  PORT-A-CATH CLOT   2010 - HAS BEEN ON LOVENOX SINCE THE CLOT  . Drug rash 07/24/2016  . Elevated blood pressure reading without diagnosis of hypertension    PT MONITORS HER B/P AT HOME  . Heart murmur   . 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 PONV (postoperative nausea and vomiting)   . Skin cancer of anterior chest BREAST CANCER PRIMARY W/ METS TO CHEST WALL SKIN CANCER--  CHEMOTHERAPY EVERY 3 WEEKS AT [facility]  . Status post skin flap graft    right chest wall with metastatis right anterior chest -no drainage or open wound.    Past Surgical History:  Procedure Laterality Date  . APPLICATION OF A-CELL OF BACK Right 07/31/2016   Procedure: CELLERATE COLLAGEN PLACEMENT;  Surgeon: Loel Lofty Dillingham, DO;  Location: [place];  Service: Plastics;  Laterality: Right;  . CHEST WALL TUMOR EXCISION    .  hemotoma     evacuation left chest wall  . I&D KNEE WITH POLY EXCHANGE Right 05/28/2016   Procedure: IRRIGATION AND DEBRIDEMENT KNEE WOUND VAC PLACMENT;  Surgeon: Susa Day, MD;  Location: [facility];  Service: Orthopedics;  Laterality: Right;  . I&D KNEE WITH POLY EXCHANGE Right 05/30/2016   Procedure: RADICAL SYNOVECTOMY,IRRIGATION AND DEBRIDEMENT KNEE WITH POLY EXCHANGE WITH ANTIBIOTIC BEADS, APPLICATION OF WOUND VAC;  Surgeon: Susa Day, MD;  Location: [facility];  Service: Orthopedics;  Laterality: Right;  . INCISION AND DRAINAGE OF WOUND Right 07/31/2016   Procedure: IRRIGATION AND DEBRIDEMENT RIGHT KNEE  WOUND;  Surgeon: Loel Lofty Dillingham, DO;  Location: [place];  Service: Plastics;  Laterality: Right;  . IRRIGATION AND DEBRIDEMENT KNEE Right 04/12/2016   Procedure: IRRIGATION AND DEBRIDEMENT KNEE;  Surgeon: Nicholes Stairs, MD;  Location: [facility];  Service: Orthopedics;  Laterality: Right;  . KNEE ARTHROSCOPY  02/13/2012   Procedure: ARTHROSCOPY KNEE;  Surgeon: Johnn Hai, MD;  Location[facility];  Service: Orthopedics;  Laterality: Left;  WITH DEBRIDEMENt   . KNEE ARTHROSCOPY WITH LATERAL MENISECTOMY  02/13/2012   Procedure: KNEE ARTHROSCOPY WITH LATERAL MENISECTOMY;  Surgeon: Johnn Hai, MD;  Location: [place];  Service: Orthopedics;;  partial  . LEFT MODIFIED RADICAL MASTECTOMY/ RIGHT TOTAL MASTECTOMY  11-13-2007   LEFT BREAST  CANCER W/ AXILLARY LYMPH NODE METASTASIS AND POST NEOADJUVANT CHEMO  . PLACEMENT PORT-A-CATH  06/22/2007   right chest  . SKIN GRAFT     chest-post tumor removal, second occurrence of cancer" 2015 surgery for Flap 2015"  . TOTAL KNEE ARTHROPLASTY Left 09/23/2012   Procedure: LEFT TOTAL KNEE ARTHROPLASTY;  Surgeon: Johnn Hai, MD;  [facility];  Service: Orthopedics;  Laterality: Left;  . TOTAL KNEE ARTHROPLASTY Right 03/20/2016   Procedure: RIGHT TOTAL KNEE ARTHROPLASTY;  Surgeon: Susa Day, MD;   [facility];  Service: Orthopedics;  Laterality: Right;  . TRANSTHORACIC ECHOCARDIOGRAM  11-18-2011  DR BENSIMHON (ECHO EVERY 3 MONTHS)   HX CHEMO INDUCED CARDIOTOXICITY/  LVSF NORMAL/ EF 55-50%/ MILD MITRIAL VALVE REGURG./ MILDLY INCREASED SYSTOLIC PRESSURE OF PULMONARY ARTERIES  . VASCULAR SURGERY Right    portacath chest    There were no vitals filed for this visit.       Subjective Assessment - 11/19/16 1525    Subjective Since knee replacement in October, is getting numbness in left hand, twitches in left arm, and spasms in left chest; has had decreased function of left arm.     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  Radiation followed October 2009. Within a month she had a rash and the doctor here told her it was a skin rash; the oncologist here sent her to [facility] and she has been on some form of chemo every three weeks since. TKA end of October 2017. A month after herknee replacement, she fell down stairs due to low hemoglobin and split it open; has had plastic portion removed and treated for infection.  Will have a spacer placed and then after infection is gone, she will have another knee replacement. 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  Has had skin mets, but it hasn't traveled anywhere else. Had a septic hematoma at right chest so a free flap was placed there from her back, so she has scars at right chest and flank; also had a muscle flap (lat flap?) placed inferior to right axilla about two years ago.   Patient Stated Goals "I'm hoping to have more ROM and that maybe that will help thie numbness of my hand." Be able to hold a glass with left hand.   Currently in Pain? No/denies   Pain Location Arm  elbow to fingertips   Pain Orientation Left   Pain Descriptors / Indicators Numbness            [facility] PT Assessment -  11/19/16 0001      Assessment   Medical Diagnosis left breast cancer with skin mets   Referring Provider Dr. Ysidro Evert Force at [facility]   Onset Date/Surgical Date --  1st diagnosis in 2009   Hand Dominance Right   Prior Therapy none     Precautions   Precautions Other (comment)   Precaution Comments cancer precautions     Restrictions   Weight Bearing Restrictions No     Balance Screen   Has the patient fallen in the past 6 months Yes   How many times? 1  EKG lead at work got wrapped around her ankle   Has the patient had a decrease in activity level because of a fear of falling?  No   Is the patient reluctant to leave their home because of a fear of  falling?  No     Home Ecologist residence   Living Arrangements Spouse/significant other;Children  adult daughter lives with them   Type of [place] to enter   Entrance Stairs-Number of Steps 4   Entrance Stairs-Rails --  yes, one side--needs railing due to [place] Two level   Alternate Level Stairs-Rails --  yes     Prior Function   Level of Independence Independent   Vocation Other (comment)  on leave currently due to knee problems   Vocation Requirements nurse in cath lab at [employer] has recumbent bike at home; can pedal 30 minutes and plans to do it 5 times/week     Cognition   Overall Cognitive Status Within Functional Limits for tasks assessed     Observation/Other Assessments   Observations left mastectomy area well healed with telangectasias seen   Skin Integrity right chest has reddened areas that she says are skin mets; has had a free flap across right chest and muscle flap at superior right flank that both show oval shaped scars   Quick DASH  56.82     Posture/Postural Control   Posture/Postural Control Postural limitations   Postural Limitations Forward head;Rounded Shoulders     ROM / Strength   AROM / PROM / Strength PROM     AROM    AROM Assessment Site Shoulder   Right/Left Shoulder Right;Left   Right Shoulder Flexion 147 Degrees   Right Shoulder ABduction 160 Degrees   Right Shoulder Internal Rotation 55 Degrees  in supine   Right Shoulder External Rotation 80 Degrees   Left Shoulder Flexion 83 Degrees   Left Shoulder ABduction 68 Degrees   Left Shoulder Internal Rotation 75 Degrees  in supine   Left Shoulder External Rotation 14 Degrees     PROM   PROM Assessment Site Shoulder   Right/Left Shoulder Left   Left Shoulder Flexion 108 Degrees   Left Shoulder ABduction 88 Degrees     Palpation   Palpation comment decreased soft tissue mobility at left chest           LYMPHEDEMA/ONCOLOGY QUESTIONNAIRE - 11/19/16 1559      Lymphedema Assessments   Lymphedema Assessments Upper extremities     Right Upper Extremity Lymphedema   10 cm Proximal to Olecranon Process 28 cm   Olecranon Process 24.3 cm   10 cm Proximal to Ulnar Styloid Process 20.3 cm   Just Proximal to Ulnar Styloid Process 15.3 cm   Across Hand at [employer] 17.9 cm   At [place] of 2nd Digit 6.2 cm     Left Upper Extremity Lymphedema   10 cm Proximal to Olecranon Process 27 cm   Olecranon Process 23.5 cm   10 cm Proximal to Ulnar Styloid Process 19 cm   Just Proximal to Ulnar Styloid Process 15.2 cm   Across Hand at [employer] 17.2 cm   At [place] of 2nd Digit 6.1 cm           Quick Dash - 11/19/16 0001    Open a tight or new jar Unable   Do heavy household chores (wash walls, wash floors) Moderate difficulty   Carry a shopping bag or briefcase Moderate difficulty   Wash your back Severe difficulty   Use a knife to cut food Mild difficulty   Recreational activities in which you take some force or impact through your  arm, shoulder, or hand (golf, hammering, tennis) Severe difficulty   During the past week, to what extent has your arm, shoulder or hand problem interfered with your normal social activities with family, friends,  neighbors, or groups? Modererately   During the past week, to what extent has your arm, shoulder or hand problem limited your work or other regular daily activities Modererately   Arm, shoulder, or hand pain. Moderate   Tingling (pins and needles) in your arm, shoulder, or hand Severe   Difficulty Sleeping Mild difficulty   DASH Score 56.82 %      Objective measurements completed on examination: See above findings.                    PT Short Term Goals - 10/13/16 1704      PT SHORT TERM GOAL #1   Title be independent in initial HEP to increase strength for prolongued standing   Baseline given 09/25/16   Time 4   Period Weeks   Status Achieved     PT SHORT TERM GOAL #2   Title able to maintain single leg stand on left LE for >3 sec for improved stabilty and reduced risk of falls.   Time 4   Period Weeks   Status Achieved     PT SHORT TERM GOAL #3   Title demonstrate left  knee AROM flexion to >100 degrees needed for greater ease with ascending stairs   Time 4   Period Weeks   Status Achieved     PT SHORT TERM GOAL #4   Title overall pain reduced by 25% for improved ability to perform walking and standing activities for return to regular duties at work   Time 4   Period Weeks   Status Achieved         Short Term Clinic Goals - 11/19/16 1700      CC Short Term Goal  #1   Title Patient will show at least 100 degrees of left shoulder flexion.   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   Status Achieved     PT LONG TERM  GOAL #2   Title reduce FOTO to < or = to 39% limitation   Status Achieved     PT LONG TERM GOAL #3   Title left knee ROM improved to > 107 degrees flexion and extension to neutral for improved functional gait and stairs   Status Achieved     PT LONG TERM GOAL #4   Title able to stand and walk for 5 hours out of a 10 hour shift for improved function at work to 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   Status Achieved     PT LONG TERM GOAL #5   Title report 50% reduction in left knee pain  with standing and walking   Status Achieved           Long Term Clinic Goals - 11/19/16 1701      CC Long Term Goal  #1   Title at least 120 degrees of left shoulder flexion for improved overhead reach   Time 8   Period Weeks   Status New     CC Long Term Goal  #2   Title at least 120 degrees of left shoulder abduction for improved ADLS   Time 8   Period Weeks   Status New     CC Long Term Goal  #3   Title at least 70 degrees of left shoulder er for improved ADLs   Time 8   Period Weeks   Status New     CC Long Term Goal  #4   Title Pt. will report at least 50% decrease in symptoms of left UE and trunk area (hand numbness, spasms, etc.)   Time 8   Period Weeks   Status New     CC Long Term Goal  #5   Title Quick DASH score reduced to 30% or lower   Baseline 56.82 at time of eval   Time 8   Period Weeks   Status New             Plan - 11/19/16 1648    Clinical Impression Statement This is a pleasant woman with h/o inflammatory left breast cancer in 2009 with treatment of chemo, mastectomy and radiation.  She developed skin mets and has been on chemo ever since.  She also had a septic seroma at left chest so has had skin and muscle flaps to that area.  She has somewhat recently noticed limited ROM and function of left UE, some involuntary twitch-like movements of left UE and spasms at left chest area.  Her AROM and PROM of left shoulder are both quite limited, strength as well. She  has a total knee replacement that needs to be removed and she will have surgery to have a spacer placed in about a month.   History and Personal Factors relevant to plan of care: total knee replacement with complications requiring it be removed, skin metastases of breast cancer   Clinical Presentation Evolving   Clinical Presentation due to: recent onset of pain, numbness, spasms and limited function   Clinical Decision Making Moderate   Rehab Potential Good   Clinical Impairments Affecting Rehab Potential chemo every three weeks; skin metastases   PT Frequency 2x / week   PT Duration 8 weeks   PT Treatment/Interventions ADLs/Self Care Home Management;Electrical Stimulation;Therapeutic exercise;Patient/family education;Manual techniques;Scar mobilization;Passive range of motion;Taping   PT Next Visit Plan Begin P/AA/A/ROM of left shoulder; initial HEP for same; myofascial release and soft tissue mobilization of trunk especially left chest; left UE strengthening   Consulted and Agree with Plan of Care Patient      Patient will benefit from skilled therapeutic intervention in order to improve the following deficits and impairments:  Impaired UE functional use, Decreased range of motion, Decreased strength, Increased muscle spasms  Visit Diagnosis: Stiffness of left shoulder, not elsewhere classified - Plan: PT plan of care cert/re-cert  Left shoulder pain, unspecified chronicity - Plan: PT plan of care cert/re-cert  Muscle weakness (generalized) - Plan: PT plan of care cert/re-cert  Other symptoms and signs involving the musculoskeletal system - Plan: PT plan of care cert/re-cert     Problem List Patient Active Problem List  Diagnosis Date Noted  . Physical exam 07/31/2016  . Allergic reaction 07/24/2016  . Drug rash 07/24/2016  . S/P total knee arthroplasty, right 05/31/2016  . Infection of prosthetic right knee joint ([place]) 05/31/2016  . Nonhealing surgical wound 05/28/2016  .  Sepsis ([place]) 05/10/2016  . UTI (urinary tract infection) 05/10/2016  . Cellulitis of right knee 05/10/2016  . Blood clot in vein   . Wound dehiscence, surgical 04/12/2016  . 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 Vitamin D deficiency 01/25/2016  . Metastatic cancer to chest wall ([place]) 02/02/2015  . Primary osteoarthritis of right knee 02/01/2015  . Anemia 02/01/2015  . Frequent UTI 01/12/2014  . Sinus tachycardia 11/03/2013  . DOE (dyspnea on exertion) 11/03/2013  . Postoperative anemia due to acute blood loss 12/24/2012  .    09/23/2012  . Sinusitis 07/28/2012  . Neuropathy due to chemotherapeutic drug ([place]) 07/25/2011  . 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 Metastatic breast cancer ([place]) 11/12/2010  . Chronic anticoagulation 11/12/2010  . Anxiety, generalized 03/26/2008    Tremaine Fuhriman 11/19/2016, 5:09 PM  [place], [place], [address] Phone: [phone]   Fax:  [phone]  Name: GORDANA KEWLEY MRN: [id number] Date of Birth: 1955/10/13  Serafina Royals, PT 11/19/16 5:09 PM

## 2016-11-21 MED FILL — GABAPENTIN 300 MG CAPSULE: 300 | 30 days supply | Qty: 90 | Fill #5

## 2016-11-21 MED FILL — DARIFENACIN ER 15 MG TABLET: 15 | 30 days supply | Qty: 30 | Fill #0

## 2016-11-21 NOTE — Progress Notes (Signed)
Please place orders in EPIC as patient is being scheduled for a pre-op appointment! Thank you! 

## 2016-11-25 ENCOUNTER — Other Ambulatory Visit (HOSPITAL_COMMUNITY): Payer: Self-pay | Admitting: *Deleted

## 2016-11-25 DIAGNOSIS — Z171 Estrogen receptor negative status [ER-]: Secondary | ICD-10-CM | POA: Diagnosis not present

## 2016-11-25 DIAGNOSIS — C50412 Malignant neoplasm of upper-outer quadrant of left female breast: Secondary | ICD-10-CM | POA: Diagnosis not present

## 2016-11-25 DIAGNOSIS — Z5112 Encounter for antineoplastic immunotherapy: Secondary | ICD-10-CM | POA: Diagnosis not present

## 2016-11-25 DIAGNOSIS — C792 Secondary malignant neoplasm of skin: Secondary | ICD-10-CM | POA: Diagnosis not present

## 2016-11-25 NOTE — Progress Notes (Signed)
EKG 03-03-16 EPIC  CHEST XRAY 05-10-16 EPIC ECHO 11-19-16 EPIC

## 2016-11-25 NOTE — Patient Instructions (Addendum)
JET TRAYNHAM  11/25/2016   Your procedure is scheduled on: Tuesday 12-02-16  Report to [facility] Main  Entrance   Report to admitting at 1030 AM  Call this number if you have problems the morning of surgery [phone]   Remember: ONLY 1 PERSON MAY GO WITH YOU TO SHORT STAY TO GET  READY MORNING OF [place].  Do not eat food or drink liquids :After Midnight.                    TAKE THESE MEDS AM OF SURGERY WITH A SIP OF WATER: ENABLES, GABAPENTIN, OXYCODONE IF NEEDED                              You may not have any metal on your body including hair pins and              piercings  Do not wear jewelry, make-up, lotions, powders or perfumes, deodorant             Do not wear nail polish.  Do not shave  48 hours prior to surgery.              Men may shave face and neck.   Do not bring valuables to the hospital. [place].  Contacts, dentures or bridgework may not be worn into surgery.  Leave suitcase in the car. After surgery it may be brought to your room.                  Please read over the following fact sheets you were given: _____________________________________________________________________             [facility] - Preparing for Surgery Before surgery, you can play an important role.  Because skin is not sterile, your skin needs to be as free of germs as possible.  You can reduce the number of germs on your skin by washing with CHG (chlorahexidine gluconate) soap before surgery.  CHG is an antiseptic cleaner which kills germs and bonds with the skin to continue killing germs even after washing. Please DO NOT use if you have an allergy to CHG or antibacterial soaps.  If your skin becomes reddened/irritated stop using the CHG and inform your nurse when you arrive at Short Stay. Do not shave (including legs and underarms) for at least 48 hours prior to the first CHG shower.  You may shave your  face/neck. Please follow these instructions carefully:  1.  Shower with CHG Soap the night before surgery and the  morning of Surgery.  2.  If you choose to wash your hair, wash your hair first as usual with your  normal  shampoo.  3.  After you shampoo, rinse your hair and body thoroughly to remove the  shampoo.                           4.  Use CHG as you would any other liquid soap.  You can apply chg directly  to the skin and wash                       Gently with a  scrungie or clean washcloth.  5.  Apply the CHG Soap to your body ONLY FROM THE NECK DOWN.   Do not use on face/ open                           Wound or open sores. Avoid contact with eyes, ears mouth and genitals (private parts).                       Wash face,  Genitals (private parts) with your normal soap.             6.  Wash thoroughly, paying special attention to the area where your surgery  will be performed.  7.  Thoroughly rinse your body with warm water from the neck down.  8.  DO NOT shower/wash with your normal soap after using and rinsing off  the CHG Soap.                9.  Pat yourself dry with a clean towel.            10.  Wear clean pajamas.            11.  Place clean sheets on your bed the night of your first shower and do not  sleep with pets. Day of Surgery : Do not apply any lotions/deodorants the morning of surgery.  Please wear clean clothes to the hospital/surgery center.  FAILURE TO FOLLOW THESE INSTRUCTIONS MAY RESULT IN THE CANCELLATION OF YOUR SURGERY PATIENT SIGNATURE_________________________________  NURSE SIGNATURE__________________________________  ________________________________________________________________________   [name]  An incentive spirometer is a tool that can help keep your lungs clear and active. This tool measures how well you are filling your lungs with each breath. Taking long deep breaths may help reverse or decrease the chance of developing breathing  (pulmonary) problems (especially infection) following:  A long period of time when you are unable to move or be active. BEFORE THE PROCEDURE   If the spirometer includes an indicator to show your best effort, your nurse or respiratory therapist will set it to a desired goal.  If possible, sit up straight or lean slightly forward. Try not to slouch.  Hold the incentive spirometer in an upright position. INSTRUCTIONS FOR USE  1. Sit on the edge of your bed if possible, or sit up as far as you can in bed or on a chair. 2. Hold the incentive spirometer in an upright position. 3. Breathe out normally. 4. Place the mouthpiece in your mouth and seal your lips tightly around it. 5. Breathe in slowly and as deeply as possible, raising the piston or the ball toward the top of the column. 6. Hold your breath for 3-5 seconds or for as long as possible. Allow the piston or ball to fall to the bottom of the column. 7. Remove the mouthpiece from your mouth and breathe out normally. 8. Rest for a few seconds and repeat Steps 1 through 7 at least 10 times every 1-2 hours when you are awake. Take your time and take a few normal breaths between deep breaths. 9. The spirometer may include an indicator to show your best effort. Use the indicator as a goal to work toward during each repetition. 10. After each set of 10 deep breaths, practice coughing to be sure your lungs are clear. If you have an incision (the cut made at the time of surgery), support your incision  when coughing by placing a pillow or rolled up towels firmly against it. Once you are able to get out of bed, walk around indoors and cough well. You may stop using the incentive spirometer when instructed by your caregiver.  RISKS AND COMPLICATIONS  Take your time so you do not get dizzy or light-headed.  If you are in pain, you may need to take or ask for pain medication before doing incentive spirometry. It is harder to take a deep breath if you  are having pain. AFTER USE  Rest and breathe slowly and easily.  It can be helpful to keep track of a log of your progress. Your caregiver can provide you with a simple table to help with this. If you are using the spirometer at home, follow these instructions: [place] IF:   You are having difficultly using the spirometer.  You have trouble using the spirometer as often as instructed.  Your pain medication is not giving enough relief while using the spirometer.  You develop fever of 100.5 F (38.1 C) or higher. SEEK IMMEDIATE MEDICAL CARE IF:   You cough up bloody sputum that had not been present before.  You develop fever of 102 F (38.9 C) or greater.  You develop worsening pain at or near the incision site. MAKE SURE YOU:   Understand these instructions.  Will watch your condition.  Will get help right away if you are not doing well or get worse. Document Released: 09/22/2006 Document Revised: 08/04/2011 Document Reviewed: 11/23/2006 ExitCare Patient Information 2014 Exit[employer], [place].   ________________________________________________________________________  WHAT IS A BLOOD TRANSFUSION? Blood Transfusion Information  A transfusion is the replacement of blood or some of its parts. Blood is made up of multiple cells which provide different functions.  Red blood cells carry oxygen and are used for blood loss replacement.  White blood cells fight against infection.  Platelets control bleeding.  Plasma helps clot blood.  Other blood products are available for specialized needs, such as hemophilia or other clotting disorders. BEFORE THE TRANSFUSION  Who gives blood for transfusions?   Healthy volunteers who are fully evaluated to make sure their blood is safe. This is blood bank blood. Transfusion therapy is the safest it has ever been in the practice of medicine. Before blood is taken from a donor, a complete history is taken to make sure that person has  no history of diseases nor engages in risky social behavior (examples are intravenous drug use or sexual activity with multiple partners). The donor's travel history is screened to minimize risk of transmitting infections, such as malaria. The donated blood is tested for signs of infectious diseases, such as HIV and hepatitis. The blood is then tested to be sure it is compatible with you in order to minimize the chance of a transfusion reaction. If you or a relative donates blood, this is often done in anticipation of surgery and is not appropriate for emergency situations. It takes many days to process the donated blood. RISKS AND COMPLICATIONS Although transfusion therapy is very safe and saves many lives, the main dangers of transfusion include:   Getting an infectious disease.  Developing a transfusion reaction. This is an allergic reaction to something in the blood you were given. Every precaution is taken to prevent this. The decision to have a blood transfusion has been considered carefully by your caregiver before blood is given. Blood is not given unless the benefits outweigh the risks. AFTER THE TRANSFUSION  Right after  receiving a blood transfusion, you will usually feel much better and more energetic. This is especially true if your red blood cells have gotten low (anemic). The transfusion raises the level of the red blood cells which carry oxygen, and this usually causes an energy increase.  The nurse administering the transfusion will monitor you carefully for complications. HOME CARE INSTRUCTIONS  No special instructions are needed after a transfusion. You may find your energy is better. Speak with your caregiver about any limitations on activity for underlying diseases you may have. SEEK MEDICAL CARE IF:   Your condition is not improving after your transfusion.  You develop redness or irritation at the intravenous (IV) site. SEEK IMMEDIATE MEDICAL CARE IF:  Any of the following  symptoms occur over the next 12 hours:  Shaking chills.  You have a temperature by mouth above 102 F (38.9 C), not controlled by medicine.  Chest, back, or muscle pain.  People around you feel you are not acting correctly or are confused.  Shortness of breath or difficulty breathing.  Dizziness and fainting.  You get a rash or develop hives.  You have a decrease in urine output.  Your urine turns a dark color or changes to pink, red, or brown. Any of the following symptoms occur over the next 10 days:  You have a temperature by mouth above 102 F (38.9 C), not controlled by medicine.  Shortness of breath.  Weakness after normal activity.  The white part of the eye turns yellow (jaundice).  You have a decrease in the amount of urine or are urinating less often.  Your urine turns a dark color or changes to pink, red, or brown. Document Released: 05/09/2000 Document Revised: 08/04/2011 Document Reviewed: 12/27/2007 [facility] Patient Information 2014 [place], [place].  _______________________________________________________________________

## 2016-11-27 ENCOUNTER — Encounter (HOSPITAL_COMMUNITY)
Admission: RE | Admit: 2016-11-27 | Discharge: 2016-11-27 | Disposition: A | Discharge status: Home / Self Care | Payer: 59 | Source: Ambulatory Visit | Attending: Orthopedic Surgery | Admitting: Orthopedic Surgery

## 2016-11-27 ENCOUNTER — Encounter (HOSPITAL_COMMUNITY): Payer: Self-pay

## 2016-11-27 DIAGNOSIS — X58XXXA Exposure to other specified factors, initial encounter: Secondary | ICD-10-CM | POA: Insufficient documentation

## 2016-11-27 DIAGNOSIS — Z01812 Encounter for preprocedural laboratory examination: Secondary | ICD-10-CM | POA: Insufficient documentation

## 2016-11-27 DIAGNOSIS — T8453XA Infection and inflammatory reaction due to internal right knee prosthesis, initial encounter: Secondary | ICD-10-CM | POA: Insufficient documentation

## 2016-11-27 LAB — CBC
HEMATOCRIT: 34.9 % — AB (ref 36.0–46.0)
HEMOGLOBIN: 11.2 g/dL — AB (ref 12.0–15.0)
MCH: 28.5 pg (ref 26.0–34.0)
MCHC: 32.1 g/dL (ref 30.0–36.0)
MCV: 88.8 fL (ref 78.0–100.0)
Platelets: 238 10*3/uL (ref 150–400)
RBC: 3.93 MIL/uL (ref 3.87–5.11)
RDW: 17.9 % — ABNORMAL HIGH (ref 11.5–15.5)
WBC: 5.3 10*3/uL (ref 4.0–10.5)

## 2016-11-27 LAB — BASIC METABOLIC PANEL
ANION GAP: 11 (ref 5–15)
BUN: 14 mg/dL (ref 6–20)
CHLORIDE: 97 mmol/L — AB (ref 101–111)
CO2: 26 mmol/L (ref 22–32)
Calcium: 9.3 mg/dL (ref 8.9–10.3)
Creatinine, Ser: 0.7 mg/dL (ref 0.44–1.00)
GFR calc non Af Amer: >60 mL/min (ref >60)
Glucose, Bld: 99 mg/dL (ref 65–99)
POTASSIUM: 3.6 mmol/L (ref 3.5–5.1)
Sodium: 134 mmol/L — ABNORMAL LOW (ref 135–145)

## 2016-11-27 LAB — SURGICAL PCR SCREEN
MRSA, PCR: NEGATIVE
Staphylococcus aureus: NEGATIVE

## 2016-12-01 NOTE — H&P (Signed)
[name] is an 61 y.o. female.    Chief Complaint:    Infected right total knee arthroplasty  Procedure:  Resection of right total knee arthroplasty and placement of antibiotic spacer  HPI: Pt is a 61 y.o. female complaining of right knee pain and swelling.  Patient fell and split open her knee soon after having a right TKA per Dr. Tonita Cong.  Subsequently she had at least 3 I&Ds  And wound vac placement.   She has had persistent drainage since.  Discussion with Dr. Alvan Dame went that route that the knee needs to be treated as an infected joint and will have to remove all components and placement of an antibiotic spacer.  X-rays in the clinic show previous TKA of the right knee. Pt has tried various conservative treatments which have failed to alleviate their symptoms. Various options are discussed with the patient. Risks, benefits and expectations were discussed with the patient. Patient understand the risks, benefits and expectations and wishes to proceed with surgery.    PCP: Midge Minium, MD  D/C Plans:       Home with [facility] for antibiotics  Post-op Meds:       No Rx given  Tranexamic Acid:      To be given - IV   Decadron:      Is to be given  FYI:     70 Lovenox daily (on pre-op)  Oxycodone 5 mg  Has a port for chemotherapy, should be able to access this vs PICC  DME:   Pt already has equipment  PT:   No PT  /  HHRN for antibiotics   PMH: Past Medical History:  Diagnosis Date  . 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 Allergic reaction 07/24/2016  . Arthritis KNEES   right knee, hx. past left knee replacemnt.  . Blood clot in vein    around Portacath right chest-tx. Eliquis about a yr., prior Lovenox.  . Blood dyscrasia   . Chronic anticoagulation HX  PORT-A-CATH CLOT   2010 - HAS BEEN ON LOVENOX SINCE THE CLOT  . Drug rash 07/24/2016  . Elevated blood pressure reading without diagnosis of hypertension    PT MONITORS HER B/P AT HOME  . Heart murmur   . 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 PONV (postoperative nausea and vomiting)    ponv likes scopolamine patch  . 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 Status post skin flap graft    right chest wall with metastatis right anterior chest -no drainage or open wound.    PSH: Past Surgical History:  Procedure Laterality Date  . APPLICATION OF A-CELL OF BACK Right 07/31/2016   Procedure: CELLERATE COLLAGEN PLACEMENT;  Surgeon: Loel Lofty Dillingham, DO;  Location: [place];  Service: Plastics;  Laterality: Right;  . CHEST WALL TUMOR EXCISION     right  . hemotoma     evacuation left chest wall  . I&D KNEE WITH POLY EXCHANGE Right 05/28/2016   Procedure: IRRIGATION AND DEBRIDEMENT KNEE WOUND VAC PLACMENT;  Surgeon: Susa Day, MD;  Location: [facility];  Service: Orthopedics;  Laterality: Right;  .  I&D KNEE WITH POLY EXCHANGE Right 05/30/2016   Procedure: RADICAL SYNOVECTOMY,IRRIGATION AND DEBRIDEMENT KNEE WITH POLY EXCHANGE WITH ANTIBIOTIC BEADS, APPLICATION OF WOUND VAC;  Surgeon: Susa Day, MD;  Location: [facility];  Service: Orthopedics;  Laterality: Right;  . INCISION AND DRAINAGE OF WOUND Right 07/31/2016   Procedure: IRRIGATION AND DEBRIDEMENT RIGHT KNEE  WOUND;  Surgeon: Loel Lofty Dillingham, DO;  Location: [place];  Service: Plastics;  Laterality: Right;  . IRRIGATION AND DEBRIDEMENT KNEE Right 04/12/2016   Procedure: IRRIGATION AND DEBRIDEMENT KNEE;  Surgeon: Nicholes Stairs, MD;  Location: [facility];  Service: Orthopedics;  Laterality: Right;  . KNEE ARTHROSCOPY  02/13/2012   Procedure: ARTHROSCOPY KNEE;  Surgeon: Johnn Hai, MD;  Location: [facility];  Service: Orthopedics;  Laterality: Left;  WITH  DEBRIDEMENt   . KNEE ARTHROSCOPY WITH LATERAL MENISECTOMY  02/13/2012   Procedure: KNEE ARTHROSCOPY WITH LATERAL MENISECTOMY;  Surgeon: Johnn Hai, MD;  Location: [place];  Service: Orthopedics;;  partial  . LEFT MODIFIED RADICAL MASTECTOMY/ RIGHT TOTAL MASTECTOMY  11-13-2007   LEFT BREAST CANCER W/ AXILLARY LYMPH NODE METASTASIS AND POST NEOADJUVANT CHEMO  . PLACEMENT PORT-A-CATH  06/22/2007   right chest new port placed 2015, right chest  . SKIN GRAFT     chest-post tumor removal, second occurrence of cancer" 2015 surgery for Flap 2015"  . TOTAL KNEE ARTHROPLASTY Left 09/23/2012   Procedure: LEFT TOTAL KNEE ARTHROPLASTY;  Surgeon: Johnn Hai, MD;  Location: [facility];  Service: Orthopedics;  Laterality: Left;  . TOTAL KNEE ARTHROPLASTY Right 03/20/2016   Procedure: RIGHT TOTAL KNEE ARTHROPLASTY;  Surgeon: Susa Day, MD;  Location: [facility];  Service: Orthopedics;  Laterality: Right;  . TRANSTHORACIC ECHOCARDIOGRAM  11-18-2011  DR BENSIMHON (ECHO EVERY 3 MONTHS)   HX CHEMO INDUCED CARDIOTOXICITY/  LVSF NORMAL/ EF 55-50%/ MILD MITRIAL VALVE REGURG./ MILDLY INCREASED SYSTOLIC PRESSURE OF PULMONARY ARTERIES  . VASCULAR SURGERY Right    portacath chest    Social History:  reports that she has never smoked. She has never used smokeless tobacco. She reports that she does not drink alcohol or use drugs.  Allergies:  Allergies  Allergen Reactions  . Penicillins Hives, Itching and Rash    Has patient had a PCN reaction causing immediate rash, facial/tongue/throat swelling, SOB or lightheadedness with hypotension: Yes Has patient had a PCN reaction causing severe rash involving mucus membranes or skin necrosis: No Has patient had a PCN reaction that required hospitalization No Has patient had a PCN reaction occurring within the last 10 years: No If all of the above answers are "NO", then may proceed with Cephalosporin use.   Denies airway involvement   . Other Rash      STERI STRIPS - Blisters  . Tape Hives    Can tolerate paper tape    Medications: No current facility-administered medications for this encounter.    Current Outpatient Prescriptions  Medication Sig Dispense Refill  . acetaminophen (TYLENOL) 500 MG tablet Take 1,000 mg by mouth every 8 (eight) hours as needed for mild pain.    . Ado-Trastuzumab Emtansine (KADCYLA IV) Inject into the vein every 21 ( twenty-one) days.    . celecoxib (CELEBREX) 200 MG capsule Take 1 capsule (200 mg total) by mouth 2 (two) times daily. (Patient taking differently: Take 200 mg by mouth daily as needed. ) 60 capsule 6  . Cholecalciferol (VITAMIN D3) 2000 UNITS TABS Take 1 tablet by mouth 4 (four) times a week.     [name]  darifenacin (ENABLEX) 15 MG 24 hr tablet Take 15 mg by mouth daily.    [name] docusate sodium (COLACE) 100 MG capsule Take 1 capsule (100 mg total) by mouth 2 (two) times daily as needed for mild constipation. (Patient taking differently: Take 100 mg by mouth 2 (two) times daily. ) 30 capsule 1  . enoxaparin (LOVENOX) 80 MG/0.8ML injection Inject 70 mg into the skin daily.    [name] gabapentin (NEURONTIN) 300 MG capsule TAKE 1 CAPSULE BY MOUTH 3 TIMES DAILY 90 capsule 6  . ondansetron (ZOFRAN) 8 MG tablet Take 1 tablet (8 mg total) by mouth every 8 (eight) hours as needed for nausea. 20 tablet 6  . oxyCODONE (OXY IR/ROXICODONE) 5 MG immediate release tablet Take 1 tablet (5 mg total) by mouth every 4 (four) hours as needed for breakthrough pain. (Patient taking differently: Take 5 mg by mouth every 4 (four) hours as needed for severe pain or breakthrough pain. ) 40 tablet 0  . traMADol (ULTRAM) 50 MG tablet TAKE 1 TABLET BY MOUTH EVERY 6-8 HOURS AS NEEDED FOR PAIN  0  . zolpidem (AMBIEN) 5 MG tablet TAKE 1 TABLET BY MOUTH ONCE DAILY AT BEDTIME AS NEEDED FOR SLEEP 30 tablet 5  . acetaminophen (TYLENOL) 325 MG tablet Take 2 tablets (650 mg total) by mouth every 6 (six) hours as needed for mild pain (or Fever >/=  101). (Patient not taking: Reported on 11/21/2016) 30 tablet 0  . linezolid (ZYVOX) 600 MG tablet Take 1 tablet (600 mg total) by mouth 2 (two) times daily. (Patient not taking: Reported on 11/06/2016) 60 tablet 3  . metroNIDAZOLE (FLAGYL) 500 MG tablet Take 1 tablet (500 mg total) by mouth 3 (three) times daily. (Patient not taking: Reported on 11/19/2016) 42 tablet 0  . polyethylene glycol (MIRALAX / GLYCOLAX) packet Take 17 g by mouth daily. (Patient not taking: Reported on 11/21/2016) 14 each 0  . triamcinolone ointment (KENALOG) 0.1 % Apply 1 application topically 2 (two) times daily. (Patient not taking: Reported on 11/06/2016) 454 g 1      Review of Systems  Constitutional: Negative.   HENT: Negative.   Eyes: Negative.   Respiratory: Negative.   Cardiovascular: Negative.   Gastrointestinal: Negative.   Genitourinary: Negative.   Musculoskeletal: Positive for joint pain.  Skin: Negative.   Neurological: Negative.   Endo/Heme/Allergies: Positive for environmental allergies.  Psychiatric/Behavioral: Negative.        Physical Exam  Constitutional: She is oriented to person, place, and time. She appears well-developed.  HENT:  Head: Normocephalic.  Eyes: Pupils are equal, round, and reactive to light.  Neck: Neck supple. No JVD present. No tracheal deviation present. No thyromegaly present.  Cardiovascular: Normal rate, regular rhythm and intact distal pulses.   Murmur heard. Respiratory: Effort normal and breath sounds normal. No respiratory distress. She has no wheezes.  GI: Soft. There is no tenderness. There is no guarding.  Musculoskeletal:       Right knee: She exhibits decreased range of motion, swelling, laceration (previous incision) and bony tenderness. She exhibits no ecchymosis, no deformity and no erythema. Tenderness found.  Lymphadenopathy:    She has no cervical adenopathy.  Neurological: She is alert and oriented to person, place, and time. A sensory deficit  (bilateral numbness in LE and UE due to chemo) is present.  Skin: Skin is warm and dry.  Psychiatric: She has a normal mood and affect.       Assessment/Plan Assessment:   Infected right  total knee arthroplasty   Plan: Patient will undergo a resection of right total knee arthroplasty and placement of antibiotic spacer on 12/02/2016 per Dr. Alvan Dame at [facility]. Risks benefits and expectations were discussed with the patient. Patient understand risks, benefits and expectations and wishes to proceed.   [place] Felicidad Sugarman   PA-C  12/01/2016, 10:01 AM

## 2016-12-02 ENCOUNTER — Inpatient Hospital Stay (HOSPITAL_COMMUNITY)
Admission: RE | Admit: 2016-12-02 | Discharge: 2016-12-05 | DRG: 465 | Disposition: A | Discharge status: Home / Self Care | Payer: 59 | Source: Ambulatory Visit | Attending: Orthopedic Surgery | Admitting: Orthopedic Surgery

## 2016-12-02 ENCOUNTER — Inpatient Hospital Stay (HOSPITAL_COMMUNITY): Payer: 59 | Admitting: Anesthesiology

## 2016-12-02 ENCOUNTER — Ambulatory Visit: Payer: 59 | Admitting: Physical Therapy

## 2016-12-02 ENCOUNTER — Encounter (HOSPITAL_COMMUNITY): Payer: Self-pay | Admitting: *Deleted

## 2016-12-02 ENCOUNTER — Encounter (HOSPITAL_COMMUNITY)
Admission: RE | Disposition: A | Discharge status: Home / Self Care | Payer: Self-pay | Source: Ambulatory Visit | Attending: Orthopedic Surgery

## 2016-12-02 DIAGNOSIS — Z9013 Acquired absence of bilateral breasts and nipples: Secondary | ICD-10-CM | POA: Diagnosis not present

## 2016-12-02 DIAGNOSIS — Y792 Prosthetic and other implants, materials and accessory orthopedic devices associated with adverse incidents: Secondary | ICD-10-CM | POA: Diagnosis not present

## 2016-12-02 DIAGNOSIS — Z9889 Other specified postprocedural states: Secondary | ICD-10-CM

## 2016-12-02 DIAGNOSIS — Z888 Allergy status to other drugs, medicaments and biological substances status: Secondary | ICD-10-CM

## 2016-12-02 DIAGNOSIS — M009 Pyogenic arthritis, unspecified: Secondary | ICD-10-CM | POA: Diagnosis not present

## 2016-12-02 DIAGNOSIS — E559 Vitamin D deficiency, unspecified: Secondary | ICD-10-CM | POA: Diagnosis not present

## 2016-12-02 DIAGNOSIS — Y831 Surgical operation with implant of artificial internal device as the cause of abnormal reaction of the patient, or of later complication, without mention of misadventure at the time of the procedure: Secondary | ICD-10-CM | POA: Diagnosis present

## 2016-12-02 DIAGNOSIS — B9689 Other specified bacterial agents as the cause of diseases classified elsewhere: Secondary | ICD-10-CM | POA: Diagnosis not present

## 2016-12-02 DIAGNOSIS — Z836 Family history of other diseases of the respiratory system: Secondary | ICD-10-CM | POA: Diagnosis not present

## 2016-12-02 DIAGNOSIS — Z9221 Personal history of antineoplastic chemotherapy: Secondary | ICD-10-CM | POA: Diagnosis not present

## 2016-12-02 DIAGNOSIS — Z880 Allergy status to penicillin: Secondary | ICD-10-CM

## 2016-12-02 DIAGNOSIS — T8453XA Infection and inflammatory reaction due to internal right knee prosthesis, initial encounter: Secondary | ICD-10-CM | POA: Diagnosis not present

## 2016-12-02 DIAGNOSIS — Z811 Family history of alcohol abuse and dependence: Secondary | ICD-10-CM | POA: Diagnosis not present

## 2016-12-02 DIAGNOSIS — Z85828 Personal history of other malignant neoplasm of skin: Secondary | ICD-10-CM | POA: Diagnosis not present

## 2016-12-02 DIAGNOSIS — A419 Sepsis, unspecified organism: Secondary | ICD-10-CM

## 2016-12-02 DIAGNOSIS — Z803 Family history of malignant neoplasm of breast: Secondary | ICD-10-CM | POA: Diagnosis not present

## 2016-12-02 DIAGNOSIS — R011 Cardiac murmur, unspecified: Secondary | ICD-10-CM | POA: Diagnosis present

## 2016-12-02 DIAGNOSIS — T8450XA Infection and inflammatory reaction due to unspecified internal joint prosthesis, initial encounter: Secondary | ICD-10-CM | POA: Diagnosis not present

## 2016-12-02 DIAGNOSIS — Z96651 Presence of right artificial knee joint: Secondary | ICD-10-CM | POA: Diagnosis not present

## 2016-12-02 DIAGNOSIS — Z89529 Acquired absence of unspecified knee: Secondary | ICD-10-CM | POA: Diagnosis not present

## 2016-12-02 DIAGNOSIS — C50919 Malignant neoplasm of unspecified site of unspecified female breast: Secondary | ICD-10-CM | POA: Diagnosis not present

## 2016-12-02 DIAGNOSIS — Z8249 Family history of ischemic heart disease and other diseases of the circulatory system: Secondary | ICD-10-CM | POA: Diagnosis not present

## 2016-12-02 DIAGNOSIS — Z79899 Other long term (current) drug therapy: Secondary | ICD-10-CM | POA: Diagnosis not present

## 2016-12-02 DIAGNOSIS — D62 Acute posthemorrhagic anemia: Secondary | ICD-10-CM | POA: Diagnosis not present

## 2016-12-02 DIAGNOSIS — Z91048 Other nonmedicinal substance allergy status: Secondary | ICD-10-CM | POA: Diagnosis not present

## 2016-12-02 DIAGNOSIS — Z853 Personal history of malignant neoplasm of breast: Secondary | ICD-10-CM

## 2016-12-02 DIAGNOSIS — Z7901 Long term (current) use of anticoagulants: Secondary | ICD-10-CM | POA: Diagnosis not present

## 2016-12-02 DIAGNOSIS — M00861 Arthritis due to other bacteria, right knee: Secondary | ICD-10-CM | POA: Diagnosis not present

## 2016-12-02 DIAGNOSIS — G8918 Other acute postprocedural pain: Secondary | ICD-10-CM | POA: Diagnosis not present

## 2016-12-02 DIAGNOSIS — D649 Anemia, unspecified: Secondary | ICD-10-CM | POA: Diagnosis not present

## 2016-12-02 DIAGNOSIS — Z820 Family history of epilepsy and other diseases of the nervous system: Secondary | ICD-10-CM | POA: Diagnosis not present

## 2016-12-02 DIAGNOSIS — T8453XD Infection and inflammatory reaction due to internal right knee prosthesis, subsequent encounter: Secondary | ICD-10-CM | POA: Diagnosis not present

## 2016-12-02 HISTORY — PX: EXCISIONAL TOTAL KNEE ARTHROPLASTY WITH ANTIBIOTIC SPACERS: SHX5827

## 2016-12-02 LAB — TYPE AND SCREEN
ABO/RH(D): A POS
Antibody Screen: NEGATIVE

## 2016-12-02 LAB — PROTIME-INR
INR: 1.05
PROTHROMBIN TIME: 13.8 s (ref 11.4–15.2)

## 2016-12-02 SURGERY — REMOVAL, TOTAL ARTHROPLASTY HARDWARE, KNEE, WITH ANTIBIOTIC SPACER INSERTION
Anesthesia: General | Site: Knee | Laterality: Right

## 2016-12-02 MED ORDER — ACETAMINOPHEN 500 MG PO TABS
1000.0000 mg | ORAL_TABLET | Freq: Three times a day (TID) | ORAL | Status: DC
  Administered 2016-12-03: 1000 mg via ORAL
  Administered 2016-12-03: 500 mg via ORAL
  Administered 2016-12-03 – 2016-12-05 (×5): 1000 mg via ORAL
  Filled 2016-12-02 (×9): qty 2

## 2016-12-02 MED ORDER — ONDANSETRON HCL 4 MG/2ML IJ SOLN
INTRAMUSCULAR | Status: DC | PRN
  Administered 2016-12-02: 4 mg via INTRAVENOUS

## 2016-12-02 MED ORDER — DEXAMETHASONE SODIUM PHOSPHATE 10 MG/ML IJ SOLN
10.0000 mg | Freq: Once | INTRAMUSCULAR | Status: AC
  Administered 2016-12-03: 10 mg via INTRAVENOUS
  Filled 2016-12-02: qty 1

## 2016-12-02 MED ORDER — KETOROLAC TROMETHAMINE 30 MG/ML IJ SOLN
INTRAMUSCULAR | Status: DC | PRN
  Administered 2016-12-02: 30 mg

## 2016-12-02 MED ORDER — VANCOMYCIN HCL 1000 MG IV SOLR
INTRAVENOUS | Status: DC | PRN
  Administered 2016-12-02: 3000 mg

## 2016-12-02 MED ORDER — MIDAZOLAM HCL 2 MG/2ML IJ SOLN
INTRAMUSCULAR | Status: AC
  Administered 2016-12-02: 2 mg via INTRAVENOUS
  Filled 2016-12-02: qty 2

## 2016-12-02 MED ORDER — POLYETHYLENE GLYCOL 3350 17 G PO PACK
17.0000 g | PACK | Freq: Two times a day (BID) | ORAL | Status: DC
  Administered 2016-12-02 – 2016-12-05 (×6): 17 g via ORAL
  Filled 2016-12-02 (×6): qty 1

## 2016-12-02 MED ORDER — ROPIVACAINE HCL 5 MG/ML IJ SOLN
INTRAMUSCULAR | Status: DC | PRN
  Administered 2016-12-02: 30 mL via PERINEURAL

## 2016-12-02 MED ORDER — TOBRAMYCIN SULFATE 1.2 G IJ SOLR
INTRAMUSCULAR | Status: DC | PRN
  Administered 2016-12-02: 3.6 mg

## 2016-12-02 MED ORDER — BISACODYL 10 MG RE SUPP: 10.0000 mg | Freq: Every day | RECTAL | Status: DC | PRN

## 2016-12-02 MED ORDER — METHOCARBAMOL 500 MG PO TABS
500.0000 mg | ORAL_TABLET | Freq: Four times a day (QID) | ORAL | Status: DC | PRN
  Administered 2016-12-05: 500 mg via ORAL
  Filled 2016-12-02: qty 1

## 2016-12-02 MED ORDER — OXYCODONE HCL 5 MG PO TABS
5.0000 mg | ORAL_TABLET | ORAL | Status: DC
  Administered 2016-12-02: 15 mg via ORAL
  Administered 2016-12-02: 10 mg via ORAL
  Administered 2016-12-03 (×2): 15 mg via ORAL
  Administered 2016-12-03 (×3): 10 mg via ORAL
  Administered 2016-12-03 – 2016-12-04 (×5): 5 mg via ORAL
  Administered 2016-12-04: 10 mg via ORAL
  Administered 2016-12-04 – 2016-12-05 (×2): 5 mg via ORAL
  Administered 2016-12-05 (×2): 10 mg via ORAL
  Filled 2016-12-02 (×3): qty 2
  Filled 2016-12-02 (×5): qty 3
  Filled 2016-12-02: qty 1
  Filled 2016-12-02: qty 2
  Filled 2016-12-02: qty 3
  Filled 2016-12-02: qty 1
  Filled 2016-12-02: qty 3
  Filled 2016-12-02 (×2): qty 2
  Filled 2016-12-02: qty 3
  Filled 2016-12-02: qty 2

## 2016-12-02 MED ORDER — DEXAMETHASONE SODIUM PHOSPHATE 10 MG/ML IJ SOLN
INTRAMUSCULAR | Status: AC
  Filled 2016-12-02: qty 1

## 2016-12-02 MED ORDER — FENTANYL CITRATE (PF) 100 MCG/2ML IJ SOLN
INTRAMUSCULAR | Status: AC
  Filled 2016-12-02: qty 2

## 2016-12-02 MED ORDER — CHLORHEXIDINE GLUCONATE 4 % EX LIQD: 60.0000 mL | Freq: Once | CUTANEOUS | Status: DC

## 2016-12-02 MED ORDER — PROPOFOL 10 MG/ML IV BOLUS
INTRAVENOUS | Status: DC | PRN
  Administered 2016-12-02: 150 mg via INTRAVENOUS

## 2016-12-02 MED ORDER — TRANEXAMIC ACID 1000 MG/10ML IV SOLN
1000.0000 mg | Freq: Once | INTRAVENOUS | Status: AC
  Administered 2016-12-02: 1000 mg via INTRAVENOUS
  Filled 2016-12-02: qty 1100

## 2016-12-02 MED ORDER — LIDOCAINE 2% (20 MG/ML) 5 ML SYRINGE
INTRAMUSCULAR | Status: AC
  Filled 2016-12-02: qty 5

## 2016-12-02 MED ORDER — DIPHENHYDRAMINE HCL 25 MG PO CAPS: 25.0000 mg | ORAL_CAPSULE | Freq: Four times a day (QID) | ORAL | Status: DC | PRN

## 2016-12-02 MED ORDER — ONDANSETRON HCL 4 MG/2ML IJ SOLN: 4.0000 mg | Freq: Four times a day (QID) | INTRAMUSCULAR | Status: DC | PRN

## 2016-12-02 MED ORDER — ENOXAPARIN SODIUM 80 MG/0.8ML ~~LOC~~ SOLN
70.0000 mg | Freq: Every day | SUBCUTANEOUS | Status: DC
  Administered 2016-12-03 – 2016-12-05 (×3): 70 mg via SUBCUTANEOUS
  Filled 2016-12-02 (×3): qty 0.7

## 2016-12-02 MED ORDER — FENTANYL CITRATE (PF) 100 MCG/2ML IJ SOLN
100.0000 ug | Freq: Once | INTRAMUSCULAR | Status: AC
  Administered 2016-12-02: 50 ug via INTRAVENOUS

## 2016-12-02 MED ORDER — METHOCARBAMOL 1000 MG/10ML IJ SOLN
500.0000 mg | Freq: Four times a day (QID) | INTRAVENOUS | Status: DC | PRN
  Administered 2016-12-02: 500 mg via INTRAVENOUS
  Filled 2016-12-02: qty 550

## 2016-12-02 MED ORDER — SODIUM CHLORIDE 0.9 % IR SOLN
Status: DC | PRN
  Administered 2016-12-02: 3000 mL

## 2016-12-02 MED ORDER — MAGNESIUM CITRATE PO SOLN: 1.0000 | Freq: Once | ORAL | Status: DC | PRN

## 2016-12-02 MED ORDER — TRANEXAMIC ACID 1000 MG/10ML IV SOLN
2000.0000 mg | Freq: Once | INTRAVENOUS | Status: DC
  Filled 2016-12-02: qty 20

## 2016-12-02 MED ORDER — ONDANSETRON HCL 4 MG/2ML IJ SOLN
INTRAMUSCULAR | Status: AC
  Filled 2016-12-02: qty 2

## 2016-12-02 MED ORDER — CELECOXIB 200 MG PO CAPS: 200.0000 mg | ORAL_CAPSULE | Freq: Every day | ORAL | Status: DC | PRN

## 2016-12-02 MED ORDER — BUPIVACAINE-EPINEPHRINE (PF) 0.25% -1:200000 IJ SOLN
INTRAMUSCULAR | Status: DC | PRN
  Administered 2016-12-02: 30 mL

## 2016-12-02 MED ORDER — DOCUSATE SODIUM 100 MG PO CAPS
100.0000 mg | ORAL_CAPSULE | Freq: Two times a day (BID) | ORAL | Status: DC
Start: 2016-12-02 — End: 2016-12-05
  Administered 2016-12-02 – 2016-12-05 (×6): 100 mg via ORAL
  Filled 2016-12-02 (×6): qty 1

## 2016-12-02 MED ORDER — SODIUM CHLORIDE 0.9 % IV SOLN
INTRAVENOUS | Status: DC
  Administered 2016-12-02 – 2016-12-03 (×2): via INTRAVENOUS

## 2016-12-02 MED ORDER — DEXAMETHASONE SODIUM PHOSPHATE 10 MG/ML IJ SOLN
10.0000 mg | Freq: Once | INTRAMUSCULAR | Status: AC
  Administered 2016-12-02: 10 mg via INTRAVENOUS

## 2016-12-02 MED ORDER — ALUM & MAG HYDROXIDE-SIMETH 200-200-20 MG/5ML PO SUSP: 15.0000 mL | ORAL | Status: DC | PRN

## 2016-12-02 MED ORDER — FENTANYL CITRATE (PF) 100 MCG/2ML IJ SOLN
INTRAMUSCULAR | Status: DC | PRN
  Administered 2016-12-02: 25 ug via INTRAVENOUS
  Administered 2016-12-02 (×4): 50 ug via INTRAVENOUS
  Administered 2016-12-02: 25 ug via INTRAVENOUS
  Administered 2016-12-02 (×3): 50 ug via INTRAVENOUS

## 2016-12-02 MED ORDER — KETOROLAC TROMETHAMINE 30 MG/ML IJ SOLN
INTRAMUSCULAR | Status: AC
  Filled 2016-12-02: qty 1

## 2016-12-02 MED ORDER — TOBRAMYCIN SULFATE 1.2 G IJ SOLR
INTRAMUSCULAR | Status: AC
  Filled 2016-12-02: qty 3.6

## 2016-12-02 MED ORDER — METOCLOPRAMIDE HCL 5 MG/ML IJ SOLN: 5.0000 mg | Freq: Three times a day (TID) | INTRAMUSCULAR | Status: DC | PRN

## 2016-12-02 MED ORDER — HYDROMORPHONE HCL-NACL 0.5-0.9 MG/ML-% IV SOSY
0.2500 mg | PREFILLED_SYRINGE | INTRAVENOUS | Status: DC | PRN
  Administered 2016-12-02: 0.5 mg via INTRAVENOUS
  Administered 2016-12-02 (×2): 0.25 mg via INTRAVENOUS

## 2016-12-02 MED ORDER — PROPOFOL 10 MG/ML IV BOLUS
INTRAVENOUS | Status: AC
  Filled 2016-12-02: qty 20

## 2016-12-02 MED ORDER — VANCOMYCIN HCL 1000 MG IV SOLR
INTRAVENOUS | Status: AC
  Filled 2016-12-02: qty 3000

## 2016-12-02 MED ORDER — MIDAZOLAM HCL 2 MG/2ML IJ SOLN
2.0000 mg | Freq: Once | INTRAMUSCULAR | Status: AC
  Administered 2016-12-02: 2 mg via INTRAVENOUS

## 2016-12-02 MED ORDER — SCOPOLAMINE 1 MG/3DAYS TD PT72
1.0000 | MEDICATED_PATCH | Freq: Once | TRANSDERMAL | Status: DC
  Administered 2016-12-02: 1.5 mg via TRANSDERMAL

## 2016-12-02 MED ORDER — MENTHOL 3 MG MT LOZG: 1.0000 | LOZENGE | OROMUCOSAL | Status: DC | PRN

## 2016-12-02 MED ORDER — SCOPOLAMINE 1 MG/3DAYS TD PT72
MEDICATED_PATCH | TRANSDERMAL | Status: AC
  Administered 2016-12-02: 1.5 mg via TRANSDERMAL
  Filled 2016-12-02: qty 1

## 2016-12-02 MED ORDER — TRANEXAMIC ACID 1000 MG/10ML IV SOLN
1000.0000 mg | INTRAVENOUS | Status: DC
  Filled 2016-12-02: qty 10

## 2016-12-02 MED ORDER — DARIFENACIN HYDROBROMIDE ER 15 MG PO TB24
15.0000 mg | ORAL_TABLET | Freq: Every day | ORAL | Status: DC
  Administered 2016-12-03 – 2016-12-05 (×3): 15 mg via ORAL
  Filled 2016-12-02 (×3): qty 1

## 2016-12-02 MED ORDER — ZOLPIDEM TARTRATE 5 MG PO TABS
5.0000 mg | ORAL_TABLET | Freq: Every evening | ORAL | Status: DC | PRN
  Administered 2016-12-03 – 2016-12-04 (×2): 5 mg via ORAL
  Filled 2016-12-02 (×2): qty 1

## 2016-12-02 MED ORDER — HYDROMORPHONE HCL-NACL 0.5-0.9 MG/ML-% IV SOSY
PREFILLED_SYRINGE | INTRAVENOUS | Status: AC
  Administered 2016-12-02: 0.25 mg via INTRAVENOUS
  Filled 2016-12-02: qty 2

## 2016-12-02 MED ORDER — PHENOL 1.4 % MT LIQD
1.0000 | OROMUCOSAL | Status: DC | PRN
  Filled 2016-12-02: qty 177

## 2016-12-02 MED ORDER — LIDOCAINE 2% (20 MG/ML) 5 ML SYRINGE
INTRAMUSCULAR | Status: DC | PRN
  Administered 2016-12-02: 60 mg via INTRAVENOUS

## 2016-12-02 MED ORDER — TRANEXAMIC ACID 1000 MG/10ML IV SOLN
INTRAVENOUS | Status: AC | PRN
  Administered 2016-12-02: 2000 mg via TOPICAL

## 2016-12-02 MED ORDER — SODIUM CHLORIDE 0.9 % IJ SOLN
INTRAMUSCULAR | Status: AC
  Filled 2016-12-02: qty 50

## 2016-12-02 MED ORDER — PROMETHAZINE HCL 25 MG/ML IJ SOLN: 6.2500 mg | INTRAMUSCULAR | Status: DC | PRN

## 2016-12-02 MED ORDER — LACTATED RINGERS IV SOLN
INTRAVENOUS | Status: DC
  Administered 2016-12-02 (×2): via INTRAVENOUS

## 2016-12-02 MED ORDER — ONDANSETRON HCL 4 MG PO TABS: 4.0000 mg | ORAL_TABLET | Freq: Four times a day (QID) | ORAL | Status: DC | PRN

## 2016-12-02 MED ORDER — SODIUM CHLORIDE 0.9 % IJ SOLN
INTRAMUSCULAR | Status: DC | PRN
  Administered 2016-12-02: 50 mL

## 2016-12-02 MED ORDER — VANCOMYCIN HCL IN DEXTROSE 1-5 GM/200ML-% IV SOLN
INTRAVENOUS | Status: AC
  Filled 2016-12-02: qty 200

## 2016-12-02 MED ORDER — FENTANYL CITRATE (PF) 100 MCG/2ML IJ SOLN
INTRAMUSCULAR | Status: AC
  Administered 2016-12-02: 50 ug via INTRAVENOUS
  Filled 2016-12-02: qty 2

## 2016-12-02 MED ORDER — FENTANYL CITRATE (PF) 100 MCG/2ML IJ SOLN: 25.0000 ug | INTRAMUSCULAR | Status: DC | PRN

## 2016-12-02 MED ORDER — BUPIVACAINE-EPINEPHRINE (PF) 0.25% -1:200000 IJ SOLN
INTRAMUSCULAR | Status: AC
  Filled 2016-12-02: qty 30

## 2016-12-02 MED ORDER — VANCOMYCIN HCL IN DEXTROSE 1-5 GM/200ML-% IV SOLN
1000.0000 mg | Freq: Once | INTRAVENOUS | Status: AC
  Administered 2016-12-02: 1000 mg via INTRAVENOUS

## 2016-12-02 MED ORDER — GABAPENTIN 300 MG PO CAPS
300.0000 mg | ORAL_CAPSULE | Freq: Three times a day (TID) | ORAL | Status: DC
  Administered 2016-12-02 – 2016-12-05 (×9): 300 mg via ORAL
  Filled 2016-12-02 (×9): qty 1

## 2016-12-02 MED ORDER — METOCLOPRAMIDE HCL 5 MG PO TABS: 5.0000 mg | ORAL_TABLET | Freq: Three times a day (TID) | ORAL | Status: DC | PRN

## 2016-12-02 MED ORDER — FERROUS SULFATE 325 (65 FE) MG PO TABS
325.0000 mg | ORAL_TABLET | Freq: Three times a day (TID) | ORAL | Status: DC
  Administered 2016-12-03 – 2016-12-05 (×6): 325 mg via ORAL
  Filled 2016-12-02 (×8): qty 1

## 2016-12-02 SURGICAL SUPPLY — 68 items
ADH SKN CLS APL DERMABOND .7 (GAUZE/BANDAGES/DRESSINGS) ×1
BAG SPEC THK2 15X12 ZIP CLS (MISCELLANEOUS) ×1
BAG ZIPLOCK 12X15 (MISCELLANEOUS) ×2 IMPLANT
BANDAGE ACE 6X5 VEL STRL LF (GAUZE/BANDAGES/DRESSINGS) ×2 IMPLANT
BANDAGE ESMARK 6X9 LF (GAUZE/BANDAGES/DRESSINGS) ×1 IMPLANT
BLADE SAW SGTL 11.0X1.19X90.0M (BLADE) IMPLANT
BLADE SAW SGTL 13.0X1.19X90.0M (BLADE) ×2 IMPLANT
BLADE SAW SGTL 81X20 HD (BLADE) ×2 IMPLANT
BNDG CMPR 9X6 STRL LF SNTH (GAUZE/BANDAGES/DRESSINGS) ×1
BNDG ESMARK 6X9 LF (GAUZE/BANDAGES/DRESSINGS) ×2
BOWL SMART MIX CTS (DISPOSABLE) ×4 IMPLANT
CEMENT HV SMART SET (Cement) ×6 IMPLANT
COVER SURGICAL LIGHT HANDLE (MISCELLANEOUS) ×2 IMPLANT
CUFF TOURN SGL QUICK 34 (TOURNIQUET CUFF) ×2
CUFF TRNQT CYL 34X4X40X1 (TOURNIQUET CUFF) ×1 IMPLANT
DERMABOND ADVANCED (GAUZE/BANDAGES/DRESSINGS) ×1
DERMABOND ADVANCED .7 DNX12 (GAUZE/BANDAGES/DRESSINGS) ×1 IMPLANT
DRAPE EXTREMITY T 121X128X90 (DRAPE) ×2 IMPLANT
DRAPE POUCH INSTRU U-SHP 10X18 (DRAPES) ×2 IMPLANT
DRAPE U-SHAPE 47X51 STRL (DRAPES) ×2 IMPLANT
DRESSING AQUACEL AG SP 3.5X10 (GAUZE/BANDAGES/DRESSINGS) ×1 IMPLANT
DRSG AQUACEL AG SP 3.5X10 (GAUZE/BANDAGES/DRESSINGS) ×2
DURAPREP 26ML APPLICATOR (WOUND CARE) ×4 IMPLANT
ELECT REM PT RETURN 15FT ADLT (MISCELLANEOUS) ×2 IMPLANT
FACESHIELD WRAPAROUND (MASK) ×10 IMPLANT
FACESHIELD WRAPAROUND OR TEAM (MASK) ×5 IMPLANT
GAUZE SPONGE 4X4 12PLY STRL (GAUZE/BANDAGES/DRESSINGS) ×1 IMPLANT
GAUZE XEROFORM 1X8 LF (GAUZE/BANDAGES/DRESSINGS) ×1 IMPLANT
GLOVE BIOGEL M 7.0 STRL (GLOVE) ×1 IMPLANT
GLOVE BIOGEL PI IND STRL 7.5 (GLOVE) ×1 IMPLANT
GLOVE BIOGEL PI IND STRL 8.5 (GLOVE) ×1 IMPLANT
GLOVE BIOGEL PI INDICATOR 7.5 (GLOVE) ×7
GLOVE BIOGEL PI INDICATOR 8.5 (GLOVE) ×1
GLOVE ECLIPSE 8.0 STRL XLNG CF (GLOVE) ×4 IMPLANT
GLOVE ORTHO TXT STRL SZ7.5 (GLOVE) ×4 IMPLANT
GOWN STRL REUS W/TWL LRG LVL3 (GOWN DISPOSABLE) ×2 IMPLANT
GOWN STRL REUS W/TWL XL LVL3 (GOWN DISPOSABLE) ×5 IMPLANT
HANDPIECE INTERPULSE COAX TIP (DISPOSABLE) ×2
MANIFOLD NEPTUNE II (INSTRUMENTS) ×2 IMPLANT
MARKER SKIN DUAL TIP RULER LAB (MISCELLANEOUS) ×2 IMPLANT
NDL SAFETY ECLIPSE 18X1.5 (NEEDLE) ×1 IMPLANT
NEEDLE HYPO 18GX1.5 SHARP (NEEDLE) ×2
NS IRRIG 1000ML POUR BTL (IV SOLUTION) ×2 IMPLANT
PAD ABD 8X10 STRL (GAUZE/BANDAGES/DRESSINGS) ×1 IMPLANT
PADDING CAST COTTON 6X4 STRL (CAST SUPPLIES) ×1 IMPLANT
POSITIONER SURGICAL ARM (MISCELLANEOUS) ×2 IMPLANT
SET HNDPC FAN SPRY TIP SCT (DISPOSABLE) ×1 IMPLANT
SET PAD KNEE POSITIONER (MISCELLANEOUS) ×2 IMPLANT
SPACER KASM MOLD44APX67ML KNEE (Spacer) ×1 IMPLANT
SPACER TIBIAL SM39*58 KNEE (Spacer) ×1 IMPLANT
SPONGE LAP 18X18 X RAY DECT (DISPOSABLE) ×2 IMPLANT
STAPLER VISISTAT 35W (STAPLE) ×1 IMPLANT
SUCTION FRAZIER HANDLE 12FR (TUBING) ×1
SUCTION TUBE FRAZIER 12FR DISP (TUBING) ×1 IMPLANT
SUT ETHILON 2 0 PS N (SUTURE) ×2 IMPLANT
SUT MNCRL AB 4-0 PS2 18 (SUTURE) ×1 IMPLANT
SUT PDS AB 1 CT1 27 (SUTURE) ×3 IMPLANT
SUT VIC AB 1 CT1 36 (SUTURE) IMPLANT
SUT VIC AB 2-0 CT1 27 (SUTURE) ×4
SUT VIC AB 2-0 CT1 TAPERPNT 27 (SUTURE) ×3 IMPLANT
SUT VLOC 180 0 24IN GS25 (SUTURE) IMPLANT
SYR 50ML LL SCALE MARK (SYRINGE) ×2 IMPLANT
TOWEL OR 17X26 10 PK STRL BLUE (TOWEL DISPOSABLE) ×4 IMPLANT
TOWEL OR NON WOVEN STRL DISP B (DISPOSABLE) ×2 IMPLANT
TOWER CARTRIDGE SMART MIX (DISPOSABLE) ×2 IMPLANT
WATER STERILE IRR 1500ML POUR (IV SOLUTION) ×1 IMPLANT
WRAP KNEE MAXI GEL POST OP (GAUZE/BANDAGES/DRESSINGS) ×2 IMPLANT
YANKAUER SUCT BULB TIP 10FT TU (MISCELLANEOUS) ×2 IMPLANT

## 2016-12-02 NOTE — Anesthesia Procedure Notes (Signed)
Procedure Name: LMA Insertion Date/Time: 12/02/2016 12:53 PM Performed by: Dione Booze Pre-anesthesia Checklist: Patient identified, Emergency Drugs available, Suction available and Patient being monitored Patient Re-evaluated:Patient Re-evaluated prior to inductionOxygen Delivery Method: Circle system utilized Preoxygenation: Pre-oxygenation with 100% oxygen Intubation Type: IV induction LMA: LMA with gastric port inserted LMA Size: 4.0 Number of attempts: 1 Placement Confirmation: positive ETCO2 Tube secured with: Tape Dental Injury: Teeth and Oropharynx as per pre-operative assessment

## 2016-12-02 NOTE — Transfer of Care (Signed)
Immediate Anesthesia Transfer of Care Note  Patient: JETTY BERLAND  Procedure(s) Performed: Procedure(s) with comments: EXCISIONAL TOTAL KNEE ARTHROPLASTY WITH ANTIBIOTIC SPACERS (Right) - 90 mins  Patient Location: PACU  Anesthesia Type:General and Regional  Level of Consciousness: awake, alert  and patient cooperative  Airway & Oxygen Therapy: Patient Spontanous Breathing and Patient connected to face mask oxygen  Post-op Assessment: Report given to RN and Post -op Vital signs reviewed and stable  Post vital signs: Reviewed and stable  Last Vitals:  Vitals:   12/02/16 1200 12/02/16 1520  BP: 124/67   Pulse: 85   Resp: 12   Temp:  (!) 36.4 C    Last Pain:  Vitals:   12/02/16 1057  TempSrc:   PainSc: 0-No pain         Complications: No apparent anesthesia complications

## 2016-12-02 NOTE — Progress Notes (Signed)
AssistedDr. Rob Fitzgerald with right, ultrasound guided, adductor canal block. Side rails up, monitors on throughout procedure. See vital signs in flow sheet. Tolerated Procedure well.  

## 2016-12-02 NOTE — Interval H&P Note (Signed)
History and Physical Interval Note:  12/02/2016 11:27 AM  [name]  has presented today for surgery, with the diagnosis of Infected right total knee arthroplasty  The various methods of treatment have been discussed with the patient and family. After consideration of risks, benefits and other options for treatment, the patient has consented to  Procedure(s) with comments: EXCISIONAL TOTAL KNEE ARTHROPLASTY WITH ANTIBIOTIC SPACERS (Right) - 90 mins as a surgical intervention .  The patient's history has been reviewed, patient examined, no change in status, stable for surgery.  I have reviewed the patient's chart and labs.  Questions were answered to the patient's satisfaction.     Mauri Pole

## 2016-12-02 NOTE — Brief Op Note (Signed)
12/02/2016  2:48 PM  PATIENT:  [name]  61 y.o. female  PRE-OPERATIVE DIAGNOSIS:  Infected right total knee arthroplasty  POST-OPERATIVE DIAGNOSIS:  Infected right total knee arthroplasty  PROCEDURE:  Procedure(s) with comments: EXCISIONAL TOTAL KNEE ARTHROPLASTY WITH ANTIBIOTIC SPACERS (Right) - 90 mins  SURGEON:  Surgeon(s) and Role:    Paralee Cancel, MD - Primary  PHYSICIAN ASSISTANT: Danae Orleans, PA-C  ANESTHESIA:   regional and general  EBL:  Total I/O In: 1000 [I.V.:1000] Out: 650 [Urine:550; Blood:100]  BLOOD ADMINISTERED:none  DRAINS: none   LOCAL MEDICATIONS USED:  MARCAINE plus 2 gm of Tranexamic Acid  SPECIMEN:  Source of Specimen:  right knee synovial fluid  DISPOSITION OF SPECIMEN:  PATHOLOGY  COUNTS:  YES  TOURNIQUET:   Total Tourniquet Time Documented: Thigh (Right) - 70 minutes Total: Thigh (Right) - 70 minutes   DICTATION: .Other Dictation: Dictation Number [id number]  PLAN OF CARE: Admit to inpatient   PATIENT DISPOSITION:  PACU - hemodynamically stable.   Delay start of Pharmacological VTE agent (>24hrs) due to surgical blood loss or risk of bleeding: no

## 2016-12-02 NOTE — Anesthesia Procedure Notes (Addendum)
Anesthesia Regional Block: Adductor canal block   Pre-Anesthetic Checklist: ,, timeout performed, Correct Patient, Correct Site, Correct Laterality, Correct Procedure, Correct Position, site marked, Risks and benefits discussed,  Surgical consent,  Pre-op evaluation,  At surgeon's request and post-op pain management  Laterality: Right  Prep: chloraprep       Needles:  Injection technique: Single-shot  Needle Type: Echogenic Needle     Needle Length: 9cm  Needle Gauge: 21     Additional Needles:   Procedures: ultrasound guided,,,,,,,,  Narrative:  Start time: 12/02/2016 11:50 AM End time: 12/02/2016 11:57 AM Injection made incrementally with aspirations every 5 mL.  Performed by: Personally  Anesthesiologist: Suzette Battiest

## 2016-12-02 NOTE — Anesthesia Preprocedure Evaluation (Addendum)
Anesthesia Evaluation  Patient identified by MRN, date of birth, ID band Patient awake    Reviewed: Allergy & Precautions, NPO status , Patient's Chart, lab work & pertinent test results  History of Anesthesia Complications (+) PONV  Airway Mallampati: II  TM Distance: >3 FB Neck ROM: Full    Dental no notable dental hx.    Pulmonary neg pulmonary ROS,    Pulmonary exam normal breath sounds clear to auscultation       Cardiovascular negative cardio ROS Normal cardiovascular exam Rhythm:Regular Rate:Normal     Neuro/Psych negative neurological ROS  negative psych ROS   GI/Hepatic negative GI ROS, Neg liver ROS,   Endo/Other  negative endocrine ROS  Renal/GU negative Renal ROS  negative genitourinary   Musculoskeletal negative musculoskeletal ROS (+)   Abdominal   Peds negative pediatric ROS (+)  Hematology  (+) anemia ,   Anesthesia Other Findings   Reproductive/Obstetrics negative OB ROS                            Anesthesia Physical  Anesthesia Plan  ASA: III  Anesthesia Plan: General   Post-op Pain Management:  Regional for Post-op pain   Induction: Intravenous  PONV Risk Score and Plan: 4 or greater and Ondansetron, Dexamethasone, Propofol, Midazolam and Scopolamine patch - Pre-op  Airway Management Planned: LMA  Additional Equipment:   Intra-op Plan:   Post-operative Plan: Extubation in OR  Informed Consent: I have reviewed the patients History and Physical, chart, labs and discussed the procedure including the risks, benefits and alternatives for the proposed anesthesia with the patient or authorized representative who has indicated his/her understanding and acceptance.   Dental advisory given  Plan Discussed with: CRNA and Surgeon  Anesthesia Plan Comments:        Anesthesia Quick Evaluation

## 2016-12-02 NOTE — Anesthesia Postprocedure Evaluation (Signed)
Anesthesia Post Note  Patient: [name]  Procedure(s) Performed: Procedure(s) (LRB): EXCISIONAL TOTAL KNEE ARTHROPLASTY WITH ANTIBIOTIC SPACERS (Right)     Patient location during evaluation: PACU Anesthesia Type: General Level of consciousness: awake and alert Pain management: pain level controlled Vital Signs Assessment: post-procedure vital signs reviewed and stable Respiratory status: spontaneous breathing, nonlabored ventilation, respiratory function stable and patient connected to nasal cannula oxygen Cardiovascular status: blood pressure returned to baseline and stable Postop Assessment: no signs of nausea or vomiting Anesthetic complications: no    Last Vitals:  Vitals:   12/02/16 1615 12/02/16 1630  BP: 140/79 (!) 146/70  Pulse: 95 94  Resp: 17 16  Temp: 36.8 C 36.6 C    Last Pain:  Vitals:   12/02/16 1630  TempSrc:   PainSc: 3                  Tiajuana Amass

## 2016-12-03 ENCOUNTER — Ambulatory Visit: Payer: 59 | Admitting: Physical Therapy

## 2016-12-03 DIAGNOSIS — T8453XD Infection and inflammatory reaction due to internal right knee prosthesis, subsequent encounter: Secondary | ICD-10-CM

## 2016-12-03 DIAGNOSIS — Z820 Family history of epilepsy and other diseases of the nervous system: Secondary | ICD-10-CM

## 2016-12-03 DIAGNOSIS — B9689 Other specified bacterial agents as the cause of diseases classified elsewhere: Secondary | ICD-10-CM

## 2016-12-03 DIAGNOSIS — Z8249 Family history of ischemic heart disease and other diseases of the circulatory system: Secondary | ICD-10-CM

## 2016-12-03 DIAGNOSIS — Z811 Family history of alcohol abuse and dependence: Secondary | ICD-10-CM

## 2016-12-03 DIAGNOSIS — Z91048 Other nonmedicinal substance allergy status: Secondary | ICD-10-CM

## 2016-12-03 DIAGNOSIS — Y792 Prosthetic and other implants, materials and accessory orthopedic devices associated with adverse incidents: Secondary | ICD-10-CM

## 2016-12-03 DIAGNOSIS — Z880 Allergy status to penicillin: Secondary | ICD-10-CM

## 2016-12-03 DIAGNOSIS — Z803 Family history of malignant neoplasm of breast: Secondary | ICD-10-CM

## 2016-12-03 DIAGNOSIS — M00861 Arthritis due to other bacteria, right knee: Secondary | ICD-10-CM

## 2016-12-03 DIAGNOSIS — Z836 Family history of other diseases of the respiratory system: Secondary | ICD-10-CM

## 2016-12-03 LAB — BASIC METABOLIC PANEL
ANION GAP: 9 (ref 5–15)
BUN: 12 mg/dL (ref 6–20)
CHLORIDE: 103 mmol/L (ref 101–111)
CO2: 26 mmol/L (ref 22–32)
Calcium: 8.4 mg/dL — ABNORMAL LOW (ref 8.9–10.3)
Creatinine, Ser: 0.55 mg/dL (ref 0.44–1.00)
GFR calc non Af Amer: >60 mL/min (ref >60)
Glucose, Bld: 116 mg/dL — ABNORMAL HIGH (ref 65–99)
POTASSIUM: 4.1 mmol/L (ref 3.5–5.1)
Sodium: 138 mmol/L (ref 135–145)

## 2016-12-03 LAB — CBC
HEMATOCRIT: 24.1 % — AB (ref 36.0–46.0)
HEMOGLOBIN: 8 g/dL — AB (ref 12.0–15.0)
MCH: 29 pg (ref 26.0–34.0)
MCHC: 33.2 g/dL (ref 30.0–36.0)
MCV: 87.3 fL (ref 78.0–100.0)
Platelets: 153 10*3/uL (ref 150–400)
RBC: 2.76 MIL/uL — AB (ref 3.87–5.11)
RDW: 17.9 % — ABNORMAL HIGH (ref 11.5–15.5)
WBC: 4.7 10*3/uL (ref 4.0–10.5)

## 2016-12-03 MED ORDER — VANCOMYCIN HCL IN DEXTROSE 1-5 GM/200ML-% IV SOLN
1000.0000 mg | Freq: Two times a day (BID) | INTRAVENOUS | Status: DC
  Administered 2016-12-03 – 2016-12-05 (×4): 1000 mg via INTRAVENOUS
  Filled 2016-12-03 (×6): qty 200

## 2016-12-03 MED ORDER — DIPHENHYDRAMINE HCL 25 MG PO CAPS: 25.0000 mg | ORAL_CAPSULE | Freq: Four times a day (QID) | ORAL | Status: DC | PRN

## 2016-12-03 MED ORDER — DEXTROSE 5 % IV SOLN
2.0000 g | INTRAVENOUS | Status: DC
  Administered 2016-12-03 – 2016-12-05 (×3): 2 g via INTRAVENOUS
  Filled 2016-12-03 (×4): qty 2

## 2016-12-03 NOTE — Progress Notes (Signed)
     Subjective: 1 Day Post-Op Procedure(s) (LRB): EXCISIONAL TOTAL KNEE ARTHROPLASTY WITH ANTIBIOTIC SPACERS (Right)   Patient reports pain as mild, pain controlled.  States that she is better than prior to surgery. No events throughout the night. Knows the plan with regards to IV antibiotics and eventually reimplantation of a TKA after treating the infection.   Objective:   VITALS:   Vitals:   12/03/16 0143 12/03/16 0628  BP: 103/60 109/69  Pulse: 80 80  Resp: 16 18  Temp: 97.8 F (36.6 C) 98.5 F (36.9 C)    Neurovascular intact Incision: dressing C/D/I  LABS  Recent Labs  12/03/16 0525  HGB 8.0*  HCT 24.1*  WBC 4.7  PLT 153     Recent Labs  12/03/16 0525  NA 138  K 4.1  BUN 12  CREATININE 0.55  GLUCOSE 116*     Assessment/Plan: 1 Day Post-Op Procedure(s) (LRB): EXCISIONAL TOTAL KNEE ARTHROPLASTY WITH ANTIBIOTIC SPACERS (Right) Foley cath d/c'ed Advance diet Up with therapy D/C IV fluids Discharge home with home health, eventually when ready     West Pugh. Paddy Neis   PAC  12/03/2016, 9:19 AM

## 2016-12-03 NOTE — Progress Notes (Signed)
Pharmacy Antibiotic Note  [name] is a 61 y.o. 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  Pharmacy has been consulted for Vancomycin dosing. Received 1g yesterday perioperatively, as well as 3g in spacer.  Plan:  Vancomycin 1000 mg IV q12 hr; goal trough 15-20 mcg/mL  Measure vancomycin trough levels at steady state as indicated   Height: 5\' 7"  (170.2 cm) Weight: 155 lb (70.3 kg) IBW/kg (Calculated) : 61.6  Temp (24hrs), Avg:98.1 F (36.7 C), Min:97.5 F (36.4 C), Max:98.7 F (37.1 C)   Recent Labs Lab 11/27/16 0847 12/03/16 0525  WBC 5.3 4.7  CREATININE 0.70 0.55    Estimated Creatinine Clearance: 72.7 mL/min (by C-G formula based on SCr of 0.55 mg/dL).    Allergies  Allergen Reactions  . Penicillins Hives, Itching and Rash    Has patient had a PCN reaction causing immediate rash, facial/tongue/throat swelling, SOB or lightheadedness with hypotension: Yes Has patient had a PCN reaction causing severe rash involving mucus membranes or skin necrosis: No Has patient had a PCN reaction that required hospitalization No Has patient had a PCN reaction occurring within the last 10 years: No If all of the above answers are "NO", then may proceed with Cephalosporin use.   Denies airway involvement   . Other Rash    STERI STRIPS - Blisters  . Tape Hives    Can tolerate paper tape    Antimicrobials this admission: 7/10 vancomycin >>  Dose adjustments this admission: ---  Microbiology results: 7/10 R knee synovial fluid: ngtd 7/5 MRSA PCR: neg  Thank you for allowing pharmacy to be a part of this patient's care.  Reuel Boom, PharmD, BCPS Pager: [phone] 12/03/2016, 1:59 PM

## 2016-12-03 NOTE — Progress Notes (Signed)
[facility]  Ms. Outten is a new pt this admission for [facility] though pt has received our services in the past.  [facility] will provide [facility] for home IV ABX upon DC.  [facility] is prepared for DC when ordered.  [place] will provide in hospital teaching with pt to support independence at home with IV ABX administration.   If patient discharges after hours, please call [phone].   Larry Sierras 12/03/2016, 11:09 PM

## 2016-12-03 NOTE — Progress Notes (Signed)
Spoke with patient and family at bedside. They have used [facility] in the past for [facility] services and would like to use them again. Patient has a portacath, awaiting access and cultures prior to d/c. Patient very concerned about getting in and out of her house at d/c and for appointments. Working with PT. Will continue to follow for d/c needs.

## 2016-12-03 NOTE — Progress Notes (Signed)
Physical Therapy Treatment Patient Details Name: [name] MRN: [id number] DOB: Apr 15, 1956 Today's Date: 12/03/2016    History of Present Illness Pt is a 61 year old female s/p Right EXCISIONAL TOTAL KNEE ARTHROPLASTY WITH ANTIBIOTIC SPACERS with hx of L TKA, breast cancer with bil mastectomy    PT Comments    Pt ambulated in hallway again and tolerated increasing distance well.   Follow Up Recommendations  No PT follow up     Equipment Recommendations  None recommended by PT    Recommendations for Other Services       Precautions / Restrictions Precautions Precautions: Fall Restrictions Weight Bearing Restrictions: Yes RLE Weight Bearing: Partial weight bearing RLE Partial Weight Bearing Percentage or Pounds: <50%    Mobility  Bed Mobility Overal bed mobility: Needs Assistance Bed Mobility: Supine to Sit     Supine to sit: Supervision;HOB elevated        Transfers Overall transfer level: Needs assistance Equipment used: Rolling walker (2 wheeled) Transfers: Sit to/from Stand Sit to Stand: Min assist         General transfer comment: verbal cues for technique, hand placement, forward trunk flexion, pt continues to have a little difficulty with sit to stands  Ambulation/Gait Ambulation/Gait assistance: Min guard Ambulation Distance (Feet): 160 Feet Assistive device: Rolling walker (2 wheeled) Gait Pattern/deviations: Step-to pattern;Antalgic;Trunk flexed Gait velocity: decr   General Gait Details: verbal cues for sequence, PWB status, posture   Stairs            Wheelchair Mobility    Modified Rankin (Stroke Patients Only)       Balance                                            Cognition Arousal/Alertness: Awake/alert Behavior During Therapy: WFL for tasks assessed/performed Overall Cognitive Status: Within Functional Limits for tasks assessed                                        Exercises       General Comments        Pertinent Vitals/Pain Pain Assessment: 0-10 Pain Score: 4  Pain Location: R knee Pain Descriptors / Indicators: Aching;Sore Pain Intervention(s): Limited activity within patient's tolerance;Repositioned;Monitored during session;Ice applied    Home Living Family/patient expects to be discharged to:: Private residence Living Arrangements: Spouse/significant other;Children Available Help at Discharge: Family Type of Home: House Home Access: Stairs to enter Entrance Stairs-Rails: Right Home Layout: Two level;Able to live on main level with bedroom/bathroom Home Equipment: Gilford Rile - 2 wheels;Cane - single point;Bedside commode;Walker - 4 wheels      Prior Function Level of Independence: Independent          PT Goals (current goals can now be found in the care plan section) Acute Rehab PT Goals PT Goal Formulation: With patient Time For Goal Achievement: 12/08/16 Potential to Achieve Goals: Good Progress towards PT goals: Progressing toward goals    Frequency    Min 5X/week      PT Plan Current plan remains appropriate    Co-evaluation              AM-PAC PT "6 Clicks" Daily Activity  Outcome Measure  Difficulty turning over in bed (including adjusting bedclothes, sheets and blankets)?: None  Difficulty moving from lying on back to sitting on the side of the bed? : None Difficulty sitting down on and standing up from a chair with arms (e.g., wheelchair, bedside commode, etc,.)?: Total Help needed moving to and from a bed to chair (including a wheelchair)?: A Lot Help needed walking in hospital room?: A Little Help needed climbing 3-5 steps with a railing? : A Little 6 Click Score: 17    End of Session Equipment Utilized During Treatment: Gait belt Activity Tolerance: Patient tolerated treatment well Patient left: with call bell/phone within reach;in chair;with family/visitor present Nurse Communication: Mobility status PT Visit  Diagnosis: Difficulty in walking, not elsewhere classified (R26.2)     Time: 7893-8101 PT Time Calculation (min) (ACUTE ONLY): 18 min  Charges:  $Gait Training: 8-22 mins                    G Codes:       [name], PT, DPT 12/03/2016 Pager: [phone]    [name] 12/03/2016, 4:29 PM

## 2016-12-03 NOTE — Evaluation (Signed)
Physical Therapy Evaluation Patient Details Name: [name] MRN: [id number] DOB: 11/14/1955 Today's Date: 12/03/2016   History of Present Illness  Pt is a 61 year old female s/p Right EXCISIONAL TOTAL KNEE ARTHROPLASTY WITH ANTIBIOTIC SPACERS with hx of L TKA, breast cancer with bil mastectomy  Clinical Impression  Patient is s/p above surgery resulting in functional limitations due to the deficits listed below (see PT Problem List).  Patient will benefit from skilled PT to increase their independence and safety with mobility to allow discharge to the venue listed below.   Pt assisted with ambulating in hallway and educated on PWB status.  Pt concerned about getting into/ out of her home, so plan to attempt steps prior to d/c (also states she has talked to nearby fire station for assist).  Pt was receiving OP PT for L shoulder however reports she plans to hold off on this until after next R knee surgery.     Follow Up Recommendations No PT follow up    Equipment Recommendations  None recommended by PT    Recommendations for Other Services       Precautions / Restrictions Precautions Precautions: Fall Restrictions Weight Bearing Restrictions: Yes RLE Weight Bearing: Partial weight bearing RLE Partial Weight Bearing Percentage or Pounds: <50%      Mobility  Bed Mobility Overal bed mobility: Needs Assistance Bed Mobility: Supine to Sit     Supine to sit: Supervision;HOB elevated        Transfers Overall transfer level: Needs assistance Equipment used: Rolling walker (2 wheeled) Transfers: Sit to/from Stand Sit to Stand: Min assist         General transfer comment: verbal cues for technique, hand placement, forward trunk flexion, attempted twice due to pt having difficulty with rise  Ambulation/Gait Ambulation/Gait assistance: Min guard Ambulation Distance (Feet): 100 Feet Assistive device: Rolling walker (2 wheeled) Gait Pattern/deviations: Step-to  pattern;Antalgic;Trunk flexed Gait velocity: decr   General Gait Details: verbal cues for sequence, PWB status, posture  Stairs            Wheelchair Mobility    Modified Rankin (Stroke Patients Only)       Balance                                             Pertinent Vitals/Pain Pain Assessment: 0-10 Pain Score: 4  Pain Location: R knee Pain Descriptors / Indicators: Aching;Sore Pain Intervention(s): Monitored during session;Limited activity within patient's tolerance;Repositioned;Ice applied;Premedicated before session    Home Living Family/patient expects to be discharged to:: Private residence Living Arrangements: Spouse/significant other;Children Available Help at Discharge: Family Type of Home: House Home Access: Stairs to enter Entrance Stairs-Rails: Right Entrance Stairs-Number of Steps: [place]: Two level;Able to live on main level with bedroom/bathroom Home Equipment: Gilford Rile - 2 wheels;Cane - single point;Bedside commode;Walker - 4 wheels      Prior Function Level of Independence: Independent               Hand Dominance        Extremity/Trunk Assessment        Lower Extremity Assessment Lower Extremity Assessment: RLE deficits/detail RLE Deficits / Details: able to perform SLR, ankle pumps       Communication   Communication: No difficulties  Cognition Arousal/Alertness: Awake/alert Behavior During Therapy: WFL for tasks assessed/performed Overall Cognitive Status: Within Functional Limits  for tasks assessed                                        General Comments      Exercises     Assessment/Plan    PT Assessment Patient needs continued PT services  PT Problem List Decreased strength;Decreased mobility;Decreased range of motion;Decreased knowledge of use of DME;Pain;Decreased knowledge of precautions       PT Treatment Interventions DME instruction;Gait training;Therapeutic  exercise;Therapeutic activities;Patient/family education;Functional mobility training;Stair training    PT Goals (Current goals can be found in the Care Plan section)  Acute Rehab PT Goals PT Goal Formulation: With patient Time For Goal Achievement: 12/08/16 Potential to Achieve Goals: Good    Frequency Min 5X/week   Barriers to discharge        Co-evaluation               AM-PAC PT "6 Clicks" Daily Activity  Outcome Measure Difficulty turning over in bed (including adjusting bedclothes, sheets and blankets)?: None Difficulty moving from lying on back to sitting on the side of the bed? : None Difficulty sitting down on and standing up from a chair with arms (e.g., wheelchair, bedside commode, etc,.)?: A Little Help needed moving to and from a bed to chair (including a wheelchair)?: A Little Help needed walking in hospital room?: A Little Help needed climbing 3-5 steps with a railing? : A Little 6 Click Score: 20    End of Session Equipment Utilized During Treatment: Gait belt Activity Tolerance: Patient tolerated treatment well Patient left: with call bell/phone within reach;in chair Nurse Communication: Mobility status      Time: 7672-0947 PT Time Calculation (min) (ACUTE ONLY): 17 min   Charges:   PT Evaluation $PT Eval Low Complexity: 1 Procedure     PT G CodesCarmelia Bake, PT, DPT 12/03/2016 Pager: [phone]  [name] 12/03/2016, 1:00 PM

## 2016-12-03 NOTE — Consult Note (Signed)
[facility]       Reason for Consult: PJI    Referring Physician: Dr. Alvan Dame  Principal Problem:   Right knee abx spacer   . acetaminophen  1,000 mg Oral Q8H  . darifenacin  15 mg Oral Daily  . docusate sodium  100 mg Oral BID  . enoxaparin  70 mg Subcutaneous Daily  . ferrous sulfate  325 mg Oral TID PC  . gabapentin  300 mg Oral TID  . oxyCODONE  5-15 mg Oral Q4H  . polyethylene glycol  17 g Oral BID    Recommendations: Vancomycin and ceftriaxone Will narrow based on any cultures Has port for access  Baseline CRP, ESR Labs per home health including ESR, CRP every 2 weeks  Assessment: She has a PJI now s/p resection arthroplasty.  No positive cultures. Will plan on IV therapy for 6 weeks through August 21 at least Allergy - does tolerate cephalosporins  Antibiotics: Vancomycin perioperatively Was not on antibiotics for 3 weeks  HPI: [name] is a 61 y.o. female with a history of left knee arthroplasty in 2009 and then right TKA 79/72/8206 complicated by a fall and underwent I and D in November 2018.  She took cephalexin after that but had persistent drainage with cellulitis and treated with a short course of vancomycin + aztreonam and transitioned to cipro for possible UTI but continued drainage and readmitted in January 2018.  She underwent I and D and poly-exchange and VAC placed and joint cultures grew Enterococcus faecalis and completed 6 weeks of IV vancomcyin and transitioned to oral amoxicillin after an allergy test for penicillin but developed hives.   She then transitioned to oral linezolid and took about 6 weeks but stopped on her own with concern for side effects.  She did require surgery on her wound by Dr. Baltazar Apo in March and had done well but developed a hematoma after bumping her knee in June and was debrided and grew an anaerob and was put on metronidazole which she took until 3 weeks ago.  She came in now though with knee  pain and swelling and now s/p above surgery.  Cultures sent.       Review of Systems:  Constitutional: negative for fatigue and malaise Gastrointestinal: negative for diarrhea Integument/breast: negative for rash All other systems reviewed and are negative    Past Medical History:  Diagnosis Date  . 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 Allergic reaction 07/24/2016  . Arthritis KNEES   right knee, hx. past left knee replacemnt.  . Blood clot in vein    around Portacath right chest-tx. Eliquis about a yr., prior Lovenox.  . Blood dyscrasia   . Chronic anticoagulation HX  PORT-A-CATH CLOT   2010 - HAS BEEN ON LOVENOX SINCE THE CLOT  . Drug rash 07/24/2016  . Elevated blood pressure reading without diagnosis of hypertension    PT MONITORS HER B/P AT HOME  . Heart murmur   . 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 PONV (postoperative nausea and vomiting)    ponv likes scopolamine patch  . Skin cancer of anterior chest BREAST CANCER PRIMARY W/ METS TO CHEST WALL SKIN CANCER--  CHEMOTHERAPY EVERY 3 WEEKS AT [facility]   left 9 yrs ago, chemo every 3 weeks at [facility], last chemo  july 3rd  . Status post skin flap graft    right chest wall with metastatis right anterior chest -no drainage or open wound.    Social History  Substance Use Topics  . Smoking status: Never Smoker  . Smokeless tobacco: Never Used  . Alcohol use No    Family History  Problem Relation Age of Onset  . Heart disease Mother        due to mitral valve regurgiation,   . Cancer Mother        breast  . Allergic rhinitis Mother   . Parkinsonism Father   . Allergic rhinitis Father   . Migraines Father   . Alcohol abuse Paternal Grandfather   . Angioedema Neg Hx   . Asthma Neg Hx   . Eczema Neg Hx     Allergies  Allergen Reactions  . Penicillins Hives, Itching and Rash    Has  patient had a PCN reaction causing immediate rash, facial/tongue/throat swelling, SOB or lightheadedness with hypotension: Yes Has patient had a PCN reaction causing severe rash involving mucus membranes or skin necrosis: No Has patient had a PCN reaction that required hospitalization No Has patient had a PCN reaction occurring within the last 10 years: No If all of the above answers are "NO", then may proceed with Cephalosporin use.   Denies airway involvement   . Other Rash    STERI STRIPS - Blisters  . Tape Hives    Can tolerate paper tape    Physical Exam: Constitutional: in no apparent distress and alert  Vitals:   12/03/16 1005 12/03/16 1401  BP: (!) 107/53 (!) 105/55  Pulse: 85 96  Resp: 18 18  Temp: 98.7 F (37.1 C) 99 F (37.2 C)   EYES: anicteric ENMT: no thrush Cardiovascular: Cor RRR Respiratory: CTA B; normal respiratory effort GI: Bowel sounds are normal, liver is not enlarged, spleen is not enlarged, soft, nt Musculoskeletal: right leg wrapped Skin: negatives: no rash Hematologic: no cervical lad  Lab Results  Component Value Date   WBC 4.7 12/03/2016   HGB 8.0 (L) 12/03/2016   HCT 24.1 (L) 12/03/2016   MCV 87.3 12/03/2016   PLT 153 12/03/2016    Lab Results  Component Value Date   CREATININE 0.55 12/03/2016   BUN 12 12/03/2016   NA 138 12/03/2016   K 4.1 12/03/2016   CL 103 12/03/2016   CO2 26 12/03/2016    Lab Results  Component Value Date   ALT 25 07/31/2016   AST 52 (H) 07/31/2016   ALKPHOS 98 07/31/2016     Microbiology: Recent Results (from the past 240 hour(s))  Surgical pcr screen     Status: None   Collection Time: 11/27/16  8:47 AM  Result Value Ref Range Status   MRSA, PCR NEGATIVE NEGATIVE Final   Staphylococcus aureus NEGATIVE NEGATIVE Final    Comment:        The Xpert SA Assay (FDA approved for NASAL specimens in patients over 71 years of age), is one component of a comprehensive surveillance program.  Test  performance has been validated by [facility] for patients greater than or equal to 76 year old. It is not intended to diagnose infection nor to guide or monitor treatment.   Aerobic/Anaerobic Culture (surgical/deep wound)     Status: None (Preliminary result)   Collection Time: 12/02/16  1:38 PM  Result Value Ref Range Status   Specimen Description KNEE RIGHT KNEE JOINT  Final   Special Requests  NONE  Final   Gram Stain   Final    ABUNDANT WBC PRESENT, PREDOMINANTLY PMN NO ORGANISMS SEEN Gram Stain Report Called to,Read Back By and Verified With: M.COTTA AT 1500 ON 12/02/16 BY N.THOMPSON    Culture   Final    NO GROWTH < 24 HOURS Performed at [facility], [place] [address] [place], [place] [address]    Report Status PENDING  Incomplete  Body fluid culture     Status: None (Preliminary result)   Collection Time: 12/02/16  1:38 PM  Result Value Ref Range Status   Specimen Description SYNOVIAL RIGHT KNEE  Final   Special Requests NONE  Final   Gram Stain   Final    MODERATE WBC PRESENT, PREDOMINANTLY PMN NO ORGANISMS SEEN Gram Stain Report Called to,Read Back By and Verified With: M.COTTA AT 1500 ON 12/02/16 BY N.THOMPSON    Culture   Final    NO GROWTH < 24 HOURS Performed at [facility], [place] [address], [place], [place] [address]    Report Status PENDING  Incomplete    Audreana Hancox, Herbie [place], [facility] [place] [facility] [email or web address] [phone] pager  [phone] cell 12/03/2016, 2:09 PM

## 2016-12-03 NOTE — Progress Notes (Signed)
OT Cancellation Note  Patient Details Name: [name] MRN: [id number] DOB: [birth year]   Cancelled Treatment:    Reason Eval/Treat Not Completed: PT screened, no needs identified, will sign off.  Pt has had multiple sxs and is moving well with PT, following PWB. Will sign off  Christifer Chapdelaine 12/03/2016, 10:40 AM  Lesle Chris, OTR/L [phone] 12/03/2016

## 2016-12-04 ENCOUNTER — Encounter: Payer: Self-pay | Admitting: Physical Therapy

## 2016-12-04 LAB — BASIC METABOLIC PANEL
ANION GAP: 5 (ref 5–15)
BUN: 11 mg/dL (ref 6–20)
CHLORIDE: 105 mmol/L (ref 101–111)
CO2: 29 mmol/L (ref 22–32)
Calcium: 8.5 mg/dL — ABNORMAL LOW (ref 8.9–10.3)
Creatinine, Ser: 0.6 mg/dL (ref 0.44–1.00)
GFR calc Af Amer: >60 mL/min (ref >60)
Glucose, Bld: 100 mg/dL — ABNORMAL HIGH (ref 65–99)
POTASSIUM: 4.1 mmol/L (ref 3.5–5.1)
SODIUM: 139 mmol/L (ref 135–145)

## 2016-12-04 LAB — SEDIMENTATION RATE: Sed Rate: 78 mm/hr — ABNORMAL HIGH (ref 0–22)

## 2016-12-04 LAB — CBC
HCT: 23 % — ABNORMAL LOW (ref 36.0–46.0)
Hemoglobin: 7.6 g/dL — ABNORMAL LOW (ref 12.0–15.0)
MCH: 29.6 pg (ref 26.0–34.0)
MCHC: 33 g/dL (ref 30.0–36.0)
MCV: 89.5 fL (ref 78.0–100.0)
PLATELETS: 174 10*3/uL (ref 150–400)
RBC: 2.57 MIL/uL — AB (ref 3.87–5.11)
RDW: 18.2 % — ABNORMAL HIGH (ref 11.5–15.5)
WBC: 6.3 10*3/uL (ref 4.0–10.5)

## 2016-12-04 LAB — C-REACTIVE PROTEIN: CRP: 4.1 mg/dL — AB (ref <1.0)

## 2016-12-04 MED ORDER — SODIUM CHLORIDE 0.9% FLUSH: 10.0000 mL | Freq: Two times a day (BID) | INTRAVENOUS | Status: DC

## 2016-12-04 MED ORDER — SODIUM CHLORIDE 0.9% FLUSH
10.0000 mL | INTRAVENOUS | Status: DC | PRN
  Administered 2016-12-04: 10 mL

## 2016-12-04 NOTE — Progress Notes (Signed)
Patient ID: [name], female   DOB: 25-Apr-1956, 61 y.o.   MRN: [id number] Subjective: 2 Days Post-Op Procedure(s) (LRB): EXCISIONAL TOTAL KNEE ARTHROPLASTY WITH ANTIBIOTIC SPACERS (Right)    Patient reports pain as mild.  Recognizes being afebrile since surgery, tolerating her post op course and spacer well.  Great attitude  Objective:   VITALS:   Vitals:   12/03/16 2130 12/04/16 0538  BP: (!) 101/55 (!) 110/58  Pulse: 81 78  Resp: 17 17  Temp: 98.2 F (36.8 C) 98 F (36.7 C)    Neurologically intact  I changed her dressing today and removed a couple sutures in mid portion of wound as it appeared to have increased skin edge tension Redressed with zeroform and gauze  LABS  Recent Labs  12/03/16 0525 12/04/16 0501  HGB 8.0* 7.6*  HCT 24.1* 23.0*  WBC 4.7 6.3  PLT 153 174     Recent Labs  12/03/16 0525 12/04/16 0501  NA 138 139  K 4.1 4.1  BUN 12 11  CREATININE 0.55 0.60  GLUCOSE 116* 100*     Recent Labs  12/02/16 1100  INR 1.05     Assessment/Plan: 2 Days Post-Op Procedure(s) (LRB): EXCISIONAL TOTAL KNEE ARTHROPLASTY WITH ANTIBIOTIC SPACERS (Right)   Advance diet Up with therapy  Plan to hold today, therapy ID consult appreciated, cultures pending PWB RLE D/C pending re-eval in am

## 2016-12-04 NOTE — Progress Notes (Signed)
Physical Therapy Treatment Patient Details Name: [name] MRN: [id number] DOB: Aug 13, 1955 Today's Date: 12/04/2016    History of Present Illness Pt is a 61 year old female s/p Right EXCISIONAL TOTAL KNEE ARTHROPLASTY WITH ANTIBIOTIC SPACERS with hx of L TKA, breast cancer with bil mastectomy    PT Comments    Pt ambulated in hallway and practiced safe stair technique.  Pt reports feeling more comfortable with performing steps at home now.  Pt also performed a couple LE exercises (which did not involve knee flexion).   Follow Up Recommendations  No PT follow up     Equipment Recommendations  None recommended by PT    Recommendations for Other Services       Precautions / Restrictions Precautions Precautions: Fall Restrictions Weight Bearing Restrictions: Yes RLE Weight Bearing: Partial weight bearing RLE Partial Weight Bearing Percentage or Pounds: <50%    Mobility  Bed Mobility Overal bed mobility: Modified Independent                Transfers Overall transfer level: Needs assistance Equipment used: Rolling walker (2 wheeled) Transfers: Sit to/from Stand Sit to Stand: Min guard         General transfer comment: verbal cues for technique, hand placement, forward trunk flexion, pt able to perform without physical assist today however increased time  Ambulation/Gait Ambulation/Gait assistance: Min guard Ambulation Distance (Feet): 160 Feet Assistive device: Rolling walker (2 wheeled) Gait Pattern/deviations: Step-to pattern;Antalgic;Trunk flexed Gait velocity: decr   General Gait Details: verbal cues for sequence, PWB status, posture   Stairs Stairs: Yes   Stair Management: Step to pattern;Forwards;Two rails Number of Stairs: 3 General stair comments: verbal cues for sequence and safety, maintaining PWB, performed 3 up and 2 down and then performed again  Wheelchair Mobility    Modified Rankin (Stroke Patients Only)       Balance                                             Cognition Arousal/Alertness: Awake/alert Behavior During Therapy: WFL for tasks assessed/performed Overall Cognitive Status: Within Functional Limits for tasks assessed                                        Exercises Total Joint Exercises Ankle Circles/Pumps: AROM;10 reps;Both Quad Sets: AROM;Both;10 reps Hip ABduction/ADduction: AROM;Right;10 reps Straight Leg Raises: AAROM;Right;10 reps    General Comments        Pertinent Vitals/Pain Pain Assessment: 0-10 Pain Score: 2  Pain Location: R knee Pain Descriptors / Indicators: Aching;Sore Pain Intervention(s): Limited activity within patient's tolerance;Repositioned;Premedicated before session;Ice applied    Home Living                      Prior Function            PT Goals (current goals can now be found in the care plan section) Progress towards PT goals: Progressing toward goals    Frequency    Min 5X/week      PT Plan Current plan remains appropriate    Co-evaluation              AM-PAC PT "6 Clicks" Daily Activity  Outcome Measure  Difficulty turning over in bed (including adjusting bedclothes,  sheets and blankets)?: None Difficulty moving from lying on back to sitting on the side of the bed? : None Difficulty sitting down on and standing up from a chair with arms (e.g., wheelchair, bedside commode, etc,.)?: A Little Help needed moving to and from a bed to chair (including a wheelchair)?: A Little Help needed walking in hospital room?: A Little Help needed climbing 3-5 steps with a railing? : A Little 6 Click Score: 20    End of Session Equipment Utilized During Treatment: Gait belt Activity Tolerance: Patient tolerated treatment well Patient left: in chair;with call bell/phone within reach   PT Visit Diagnosis: Difficulty in walking, not elsewhere classified (R26.2)     Time: 1173-5670 PT Time Calculation  (min) (ACUTE ONLY): 28 min  Charges:  $Gait Training: 23-37 mins                    G Codes:       [name], PT, DPT 12/04/2016 Pager: [phone]   [name] 12/04/2016, 1:02 PM

## 2016-12-04 NOTE — Consult Note (Signed)
   [facility] CM Inpatient Consult   12/04/2016  [name] Oct 16, 1955 [id number]   Came to bedside to speak with [name] at bedside on behalf of [employer] [employer] program for [employer] employees/dependents with [facility] insurance.   [name] denies having any Link to Wellness needs. She is agreeable to post hospital discharge call however.   States she already has Link to [employer].    Marthenia Rolling, MSN-Ed, RN,BSN [facility] Liaison [phone]

## 2016-12-05 ENCOUNTER — Telehealth: Payer: Self-pay | Admitting: *Deleted

## 2016-12-05 DIAGNOSIS — Z95828 Presence of other vascular implants and grafts: Secondary | ICD-10-CM

## 2016-12-05 LAB — BASIC METABOLIC PANEL
ANION GAP: 7 (ref 5–15)
BUN: 9 mg/dL (ref 6–20)
CHLORIDE: 101 mmol/L (ref 101–111)
CO2: 30 mmol/L (ref 22–32)
CREATININE: 0.62 mg/dL (ref 0.44–1.00)
Calcium: 8.5 mg/dL — ABNORMAL LOW (ref 8.9–10.3)
GFR calc non Af Amer: >60 mL/min (ref >60)
Glucose, Bld: 121 mg/dL — ABNORMAL HIGH (ref 65–99)
POTASSIUM: 3.9 mmol/L (ref 3.5–5.1)
SODIUM: 138 mmol/L (ref 135–145)

## 2016-12-05 LAB — CBC
HEMATOCRIT: 23.1 % — AB (ref 36.0–46.0)
HEMOGLOBIN: 7.4 g/dL — AB (ref 12.0–15.0)
MCH: 28.4 pg (ref 26.0–34.0)
MCHC: 32 g/dL (ref 30.0–36.0)
MCV: 88.5 fL (ref 78.0–100.0)
Platelets: 199 10*3/uL (ref 150–400)
RBC: 2.61 MIL/uL — AB (ref 3.87–5.11)
RDW: 18.5 % — ABNORMAL HIGH (ref 11.5–15.5)
WBC: 6.2 10*3/uL (ref 4.0–10.5)

## 2016-12-05 MED ORDER — OXYCODONE HCL 5 MG PO TABS: 5.0000 mg | ORAL_TABLET | ORAL | 0 refills | Status: DC | PRN

## 2016-12-05 MED ORDER — METHOCARBAMOL 500 MG PO TABS: 500.0000 mg | ORAL_TABLET | Freq: Four times a day (QID) | ORAL | 0 refills | Status: DC | PRN

## 2016-12-05 MED ORDER — HEPARIN SOD (PORK) LOCK FLUSH 100 UNIT/ML IV SOLN
500.0000 [IU] | INTRAVENOUS | Status: DC | PRN
  Filled 2016-12-05: qty 5

## 2016-12-05 MED ORDER — CEFTRIAXONE IV (FOR PTA / DISCHARGE USE ONLY): 2.0000 g | INTRAVENOUS | 0 refills | Status: DC

## 2016-12-05 MED ORDER — FERROUS SULFATE 325 (65 FE) MG PO TABS: 325.0000 mg | ORAL_TABLET | Freq: Three times a day (TID) | ORAL | 3 refills | Status: DC

## 2016-12-05 MED ORDER — ACETAMINOPHEN 500 MG PO TABS: 1000.0000 mg | ORAL_TABLET | Freq: Three times a day (TID) | ORAL | 0 refills | Status: DC

## 2016-12-05 MED ORDER — VANCOMYCIN IV (FOR PTA / DISCHARGE USE ONLY): 1000.0000 mg | Freq: Two times a day (BID) | INTRAVENOUS | 0 refills | Status: DC

## 2016-12-05 MED ORDER — HEPARIN SOD (PORK) LOCK FLUSH 100 UNIT/ML IV SOLN
500.0000 [IU] | INTRAVENOUS | Status: DC
  Filled 2016-12-05: qty 5

## 2016-12-05 MED ORDER — POLYETHYLENE GLYCOL 3350 17 G PO PACK: 17.0000 g | PACK | Freq: Two times a day (BID) | ORAL | 0 refills | Status: DC

## 2016-12-05 NOTE — Progress Notes (Signed)
No growth to date on cultures.    Has a port in place.  I recommend continuing on vancomycin and ceftriaxone for 6 weeks through August 21st Antibiotics, labs per home health  Will place OPAT consult   We will arrange follow up in about 4 weeks  Whyatt Klinger, Herbie [place], MD

## 2016-12-05 NOTE — Telephone Encounter (Signed)
Per Dr Linus Salmons called [facility] and advised to D/C Ceftriaxone but continue the Vanc. Spoke to [employer]

## 2016-12-05 NOTE — Op Note (Signed)
[name], [name] NO.:  [id number]  MEDICAL RECORD NO.:  [id number]  LOCATION:                                 FACILITY:  PHYSICIAN:  Pietro Cassis. Alvan Dame, M.D.  DATE OF BIRTH:  1955/07/14  DATE OF PROCEDURE:  12/02/2016 DATE OF DISCHARGE:                              OPERATIVE REPORT   PREOPERATIVE DIAGNOSIS:  Infected right total knee arthroplasty.  POSTOPERATIVE DIAGNOSIS:  Infected right total knee arthroplasty.  PROCEDURE:  Resection of right total knee arthroplasty, placement of antibiotic articulating spacer.  SURGEON:  Pietro Cassis. Alvan Dame, M.D.  ASSISTANT:  Laura Orleans, PA.  Noted, Mr. [name] was present for the entirety of the case from preoperative positioning, perioperative management of the operative extremity, general facilitation of the case, and primary wound closure.  ANESTHESIA:  Preoperative regional plus general anesthesia.  BLOOD LOSS:  About 100 to 200 mL after the tourniquet was let down.  Tourniquet was up for 70 minutes at 250 mmHg.  DRAINS:  None.  SPECIMENS:  Joint fluid was sent to Pathology for Gram stain analysis.  INDICATIONS FOR PROCEDURE:  [name] is a 61 year old female, referred for evaluation of her right knee.  Unfortunately, after the total knee arthroplasty was performed, she had a fall.  This resulted in wound and quadriceps dehiscence.  She has undergone multiple procedures in an attempt to get her wound closed and healed.  There has been recurrent concern for infection.  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  Her skin with multiple bulbous areas as well as some open areas.  I felt it was in her best interest to go over resection of her knee replacement and is a 2-stage treatment.  I stressed the importance of wound healing in terms of management of her skin.  We will continue to monitor this closely.  Risks, benefits, and necessity of the procedure were discussed as  well as outlining the course.  PROCEDURE IN DETAIL:  The patient was brought to the operative theater. Once anesthesia was established, preoperative antibiotics were held until incision was made, this was going to be vancomycin.  The right lower extremity was then prepped and draped in sterile fashion.  Time-out was performed, identifying the patient, planned procedure, and extremity.  Leg was exsanguinated.  Tourniquet elevated to 250 mmHg.  The patient's old incision was identified and studied.  I used portions of her incision as well as areas where I could excise nonhealing areas as best as possible.  Based on the appearance of the wound, there were a couple areas on the medial side that were unable to be incorporated in incision, but were excised and reapproximated.  The leg was exsanguinated and tourniquet elevated to 250 mmHg.  The incision was studied and evaluated to try and determine the best way to incorporate as much of her previous incision as well as to try and debride areas that were less than optimal.  Once the incision was made soft tissue planes created though the extensor mechanism was noted to be scarred down to the subcutaneous skin in the infrapatellar region consistent with her  history of her having a wound vac placed in this area.  The median arthrotomy was then made and sent fluid as well as culture swabs.  At this point, a significant portion was carried out and sharply excising all synovium and scar and the intraarticular space, noting an abnormal appearing intra-articular space.  Once I had the knee adequately exposed including the parapatellar region as well as medial and lateral suprapatellar region, I at this point went to remove all components.  The tibia and femur were removed without significant bone loss.  Some bone loss on the box portion on the medial femoral condyle, but not substantial.  Once the components were removed on the back table, we  made cement molds based on sizing.  Femur seemed to be best fit with a size medium and the tibia with a small.  We measured the gap and selected a 15 mm tibial spacer.  On the back table, 2 g of vancomycin and 2 of tobramycin were mixed with 2 batches of cement.  While this was being done, we irrigated the intracanal space with the canal brush irrigator and then pulse lavage irrigator.  Once the knee had been irrigated and the cement molds made, the femoral cement mold was held onto the distal femur until cement had fully cured.  Once this had fully set up and the mold removed from the cement, I then mixed a third batch of cement with 1 g of vancomycin and tobramycin and then cemented the tibial cement tray onto the cut surface of the tibia.  The knee was then held in about 20 degrees of flexion to allow the cement to cure.  Excess cement was removed.  We re-irrigated the knee for a total of 6 L normal saline solution.  Once the knee had been irrigated and components placed including removing the patellar button, the extensor mechanism was reapproximated using number #1 PDS sutures.  The remainder of the wound was closed with 2-0 Vicryl and staples on the skin.  We noted on the medial aspect of the incision and inferior aspect, there were 2 areas of various significant skin breakdown and weakness.  I excised these areas and debrided and reapproximated with 2-0 nylon.  Other areas of tenuous skin were reapproximated with nylon as well.  The knee was then cleaned, dried, and dressed sterilely using Xeroform and a bulky dressing.  The patient was then brought to the recovery room in stable condition, tolerating the procedure well.  Findings reviewed with the family.  She will remain in the hospital for 2 to 3 days as we get Infectious Disease consultation and appropriate antibiotic management as well as a postop course.  She will be partial weightbearing with range of motion permitted  despite this articulating spacer.  The hope will be that will get her wounds to heal over the next 8 to 12 weeks and then plan for reimplantation.     Pietro Cassis Alvan Dame, M.D.     MDO/MEDQ  D:  12/03/2016  T:  12/03/2016  Job:  [id number]

## 2016-12-05 NOTE — Progress Notes (Signed)
PHARMACY CONSULT NOTE FOR:  OUTPATIENT  PARENTERAL ANTIBIOTIC THERAPY (OPAT)  Indication: Prosthetic joint infection Regimen: Vancomycin 1g IV q12 hr; Rocephin 2g IV q24 hr End date: 01/13/17  IV antibiotic discharge orders are pended. To discharging provider:  please sign these orders via discharge navigator,  Select New Orders & click on the button choice - Manage This Unsigned Work.     Thank you for allowing pharmacy to be a part of this patient's care.  Reuel Boom, PharmD, BCPS Pager: [phone] 12/05/2016, 10:41 AM

## 2016-12-05 NOTE — Addendum Note (Signed)
Addendum  created 12/05/16 0903 by Suzette Battiest, MD   Anesthesia Intra Blocks edited, Sign clinical note

## 2016-12-05 NOTE — Telephone Encounter (Signed)
-----   Message from [facility] Headings, MD sent at 12/05/2016  2:22 PM EDT ----- Can you let [facility] know that this patient that was discharged today on vancomycin and ceftriaxone now has a culture positive so only needs vancomycin and can stop the ceftriaxone.  She just left.  Thanks!

## 2016-12-05 NOTE — Progress Notes (Signed)
Physical Therapy Treatment and Discharge  Patient Details Name: [name] MRN: [id number] DOB: May 29, 1955 Today's Date: 12/05/2016    History of Present Illness Pt is a 61 year old female s/p Right EXCISIONAL TOTAL KNEE ARTHROPLASTY WITH ANTIBIOTIC SPACERS with hx of L TKA, breast cancer with bil mastectomy    PT Comments    Pt tolerated treatment well today. Pt feels confident with stairs and will continue to walk with husband. Pt has met goals and has no further educational needs at this time. Pt does not have any other questions and feels comfortable mobilizing with spouse.   Follow Up Recommendations  No PT follow up     Equipment Recommendations  None recommended by PT    Recommendations for Other Services       Precautions / Restrictions Precautions Precautions: Fall Restrictions Weight Bearing Restrictions: Yes RLE Weight Bearing: Partial weight bearing RLE Partial Weight Bearing Percentage or Pounds: <50%    Mobility  Bed Mobility Overal bed mobility: Modified Independent Bed Mobility: Supine to Sit     Supine to sit: Supervision;HOB elevated     General bed mobility comments: Pt able to conduct bed mobility with supervision for safety  Transfers Overall transfer level: Needs assistance Equipment used: Rolling walker (2 wheeled) Transfers: Sit to/from Stand Sit to Stand: Supervision         General transfer comment: pt able to perform with supervision and increased time   Ambulation/Gait Ambulation/Gait assistance: Supervision Ambulation Distance (Feet): 100 Feet Assistive device: Rolling walker (2 wheeled) Gait Pattern/deviations: Step-to pattern;Antalgic;Trunk flexed Gait velocity: decr   General Gait Details: verbal cues provided for posture   Stairs     Stair Management: Step to pattern;Forwards;Two rails Number of Stairs: 3 General stair comments: verbal cues provided for reinforcement, pt felt more confident and performed two  repetitions of three up and two down  Wheelchair Mobility    Modified Rankin (Stroke Patients Only)       Balance                                            Cognition Arousal/Alertness: Awake/alert Behavior During Therapy: WFL for tasks assessed/performed Overall Cognitive Status: Within Functional Limits for tasks assessed                                        Exercises      General Comments        Pertinent Vitals/Pain Pain Assessment: 0-10 Pain Score: 2  Pain Location: Pt reports mild pain as a result of therapy yesterday Pain Descriptors / Indicators: Aching;Sore Pain Intervention(s): Limited activity within patient's tolerance;Repositioned;Ice applied;Monitored during session;Premedicated before session    Home Living                      Prior Function            PT Goals (current goals can now be found in the care plan section) Progress towards PT goals: Goals met/education completed, patient discharged from PT    Frequency    Min 5X/week      PT Plan Other (comment) (Discharge from acute PT)    Co-evaluation              AM-PAC PT "6 Clicks"  Daily Activity  Outcome Measure  Difficulty turning over in bed (including adjusting bedclothes, sheets and blankets)?: None Difficulty moving from lying on back to sitting on the side of the bed? : None Difficulty sitting down on and standing up from a chair with arms (e.g., wheelchair, bedside commode, etc,.)?: A Little Help needed moving to and from a bed to chair (including a wheelchair)?: A Little Help needed walking in hospital room?: A Little Help needed climbing 3-5 steps with a railing? : A Little 6 Click Score: 20    End of Session Equipment Utilized During Treatment: Gait belt Activity Tolerance: Patient tolerated treatment well Patient left: in chair;with call bell/phone within reach;with family/visitor present   PT Visit Diagnosis:  Difficulty in walking, not elsewhere classified (R26.2)     Time: 0034-9179 PT Time Calculation (min) (ACUTE ONLY): 26 min  Charges:  $Gait Training: 23-37 mins                    G Codes:       Olegario Shearer, SPT   Reino Bellis 12/05/2016, 1:32 PM

## 2016-12-05 NOTE — Progress Notes (Signed)
PA ordered for patient to keep porta cath access for discharge for Home Health antibiotics. Patient's porta cath was flushed and capped for discharge.

## 2016-12-05 NOTE — Progress Notes (Signed)
Patient ID: Laura AUSTAD, female   DOB: 1955/09/18, 61 y.o.   MRN: [id number] Subjective: 3 Days Post-Op Procedure(s) (LRB): EXCISIONAL TOTAL KNEE ARTHROPLASTY WITH ANTIBIOTIC SPACERS (Right)    Patient reports pain as mild. A little more discomfort today after therapy yesterday but otherwise ok.  No dizziness, SOB  Objective:   VITALS:   Vitals:   12/04/16 2026 12/05/16 0448  BP: (!) 110/54 (!) 129/59  Pulse: 96 (!) 101  Resp: 18 17  Temp: 98.4 F (36.9 C) 98.6 F (37 C)    Neurovascular intact  Right knee dressing with moderate saturation related to procedure itself and anticoagulation Bruising around knee Bruising versus duskiness central incision stable versus some better after suture removal yesterday  LABS  Recent Labs  12/03/16 0525 12/04/16 0501 12/05/16 0547  HGB 8.0* 7.6* 7.4*  HCT 24.1* 23.0* 23.1*  WBC 4.7 6.3 6.2  PLT 153 174 199     Recent Labs  12/03/16 0525 12/04/16 0501 12/05/16 0547  NA 138 139 138  K 4.1 4.1 3.9  BUN 12 11 9   CREATININE 0.55 0.60 0.62  GLUCOSE 116* 100* 121*     Recent Labs  12/02/16 1100  INR 1.05     Assessment/Plan: 3 Days Post-Op Procedure(s) (LRB): EXCISIONAL TOTAL KNEE ARTHROPLASTY WITH ANTIBIOTIC SPACERS (Right)   Up with therapy  Work towards discharge today versus tomorrow depending on ID final recs  HH needs:  IV antibioitics  Dressing supplies for daily dressing changes - xeroform, dressing sponges gauze topped by 2 ABDs and ACE wrap RTC in 1 week for wound check PWB RLE Stairs ok Keep wound dry but can wash around knee

## 2016-12-05 NOTE — Discharge Instructions (Signed)
INSTRUCTIONS AFTER JOINT REPLACEMENT   o Remove items at home which could result in a fall. This includes throw rugs or furniture in walking pathways o ICE to the affected joint every three hours while awake for 30 minutes at a time, for at least the first 3-5 days, and then as needed for pain and swelling.  Continue to use ice for pain and swelling. You may notice swelling that will progress down to the foot and ankle.  This is normal after surgery.  Elevate your leg when you are not up walking on it.   o Continue to use the breathing machine you got in the hospital (incentive spirometer) which will help keep your temperature down.  It is common for your temperature to cycle up and down following surgery, especially at night when you are not up moving around and exerting yourself.  The breathing machine keeps your lungs expanded and your temperature down.   DIET:  As you were doing prior to hospitalization, we recommend a well-balanced diet.  DRESSING / WOUND CARE / SHOWERING  Daily dressing changes as directed, keep wound dry  ACTIVITY  o Increase activity slowly as tolerated, but follow the weight bearing instructions below.   o No driving for 6 weeks or until further direction given by your physician.  You cannot drive while taking narcotics.  o No lifting or carrying greater than 10 lbs. until further directed by your surgeon. o Avoid periods of inactivity such as sitting longer than an hour when not asleep. This helps prevent blood clots.  o You may return to work once you are authorized by your doctor.     WEIGHT BEARING   Partial weight bearing with assist device as directed.  as directed   EXERCISES  Results after joint replacement surgery are often greatly improved when you follow the exercise, range of motion and muscle strengthening exercises prescribed by your doctor. Safety measures are also important to protect the joint from further injury. Any time any of these  exercises cause you to have increased pain or swelling, decrease what you are doing until you are comfortable again and then slowly increase them. If you have problems or questions, call your caregiver or physical therapist for advice.   Rehabilitation is important following a joint replacement. After just a few days of immobilization, the muscles of the leg can become weakened and shrink (atrophy).  These exercises are designed to build up the tone and strength of the thigh and leg muscles and to improve motion. Often times heat used for twenty to thirty minutes before working out will loosen up your tissues and help with improving the range of motion but do not use heat for the first two weeks following surgery (sometimes heat can increase post-operative swelling).   These exercises can be done on a training (exercise) mat, on the floor, on a table or on a bed. Use whatever works the best and is most comfortable for you.    Use music or television while you are exercising so that the exercises are a pleasant break in your day. This will make your life better with the exercises acting as a break in your routine that you can look forward to.   Perform all exercises about fifteen times, three times per day or as directed.  You should exercise both the operative leg and the other leg as well.  Exercises include:    Quad Sets - Tighten up the muscle on the front of  the thigh (Quad) and hold for 5-10 seconds.    Straight Leg Raises - With your knee straight (if you were given a brace, keep it on), lift the leg to 60 degrees, hold for 3 seconds, and slowly lower the leg.  Perform this exercise against resistance later as your leg gets stronger.   Leg Slides: Lying on your back, slowly slide your foot toward your buttocks, bending your knee up off the floor (only go as far as is comfortable). Then slowly slide your foot back down until your leg is flat on the floor again.   Angel Wings: Lying on your back  spread your legs to the side as far apart as you can without causing discomfort.   Hamstring Strength:  Lying on your back, push your heel against the floor with your leg straight by tightening up the muscles of your buttocks.  Repeat, but this time bend your knee to a comfortable angle, and push your heel against the floor.  You may put a pillow under the heel to make it more comfortable if necessary.   A rehabilitation program following joint replacement surgery can speed recovery and prevent re-injury in the future due to weakened muscles. Contact your doctor or a physical therapist for more information on knee rehabilitation.    CONSTIPATION  Constipation is defined medically as fewer than three stools per week and severe constipation as less than one stool per week.  Even if you have a regular bowel pattern at home, your normal regimen is likely to be disrupted due to multiple reasons following surgery.  Combination of anesthesia, postoperative narcotics, change in appetite and fluid intake all can affect your bowels.   YOU MUST use at least one of the following options; they are listed in order of increasing strength to get the job done.  They are all available over the counter, and you may need to use some, POSSIBLY even all of these options:    Drink plenty of fluids (prune juice may be helpful) and high fiber foods Colace 100 mg by mouth twice a day  Senokot for constipation as directed and as needed Dulcolax (bisacodyl), take with full glass of water  Miralax (polyethylene glycol) once or twice a day as needed.  If you have tried all these things and are unable to have a bowel movement in the first 3-4 days after surgery call either your surgeon or your primary doctor.    If you experience loose stools or diarrhea, hold the medications until you stool forms back up.  If your symptoms do not get better within 1 week or if they get worse, check with your doctor.  If you experience "the  worst abdominal pain ever" or develop nausea or vomiting, please contact the office immediately for further recommendations for treatment.   ITCHING:  If you experience itching with your medications, try taking only a single pain pill, or even half a pain pill at a time.  You can also use Benadryl over the counter for itching or also to help with sleep.   TED HOSE STOCKINGS:  Use stockings on both legs until for at least 2 weeks or as directed by physician office. They may be removed at night for sleeping.  MEDICATIONS:  See your medication summary on the After Visit Summary that nursing will review with you.  You may have some home medications which will be placed on hold until you complete the course of blood thinner medication.  It is  important for you to complete the blood thinner medication as prescribed.  PRECAUTIONS:  If you experience chest pain or shortness of breath - call 911 immediately for transfer to the hospital emergency department.   If you develop a fever greater that 101 F, purulent drainage from wound, increased redness or drainage from wound, foul odor from the wound/dressing, or calf pain - CONTACT YOUR SURGEON.                                                   FOLLOW-UP APPOINTMENTS:  If you do not already have a post-op appointment, please call the office for an appointment to be seen by your surgeon.  Guidelines for how soon to be seen are listed in your After Visit Summary, but are typically between 1-4 weeks after surgery.  OTHER INSTRUCTIONS:   Knee Replacement:  Do not place pillow under knee, focus on keeping the knee straight while resting.   MAKE SURE YOU:   Understand these instructions.   Get help right away if you are not doing well or get worse.    Thank you for letting us be a part of your medical care team.  It is a privilege we respect greatly.  We hope these instructions will help you stay on track for a fast and full recovery!

## 2016-12-06 DIAGNOSIS — T8453XD Infection and inflammatory reaction due to internal right knee prosthesis, subsequent encounter: Secondary | ICD-10-CM | POA: Diagnosis not present

## 2016-12-06 DIAGNOSIS — Z791 Long term (current) use of non-steroidal anti-inflammatories (NSAID): Secondary | ICD-10-CM | POA: Diagnosis not present

## 2016-12-06 DIAGNOSIS — Z5181 Encounter for therapeutic drug level monitoring: Secondary | ICD-10-CM | POA: Diagnosis not present

## 2016-12-06 DIAGNOSIS — Z792 Long term (current) use of antibiotics: Secondary | ICD-10-CM | POA: Diagnosis not present

## 2016-12-06 DIAGNOSIS — Z79891 Long term (current) use of opiate analgesic: Secondary | ICD-10-CM | POA: Diagnosis not present

## 2016-12-06 LAB — AEROBIC/ANAEROBIC CULTURE W GRAM STAIN (SURGICAL/DEEP WOUND)

## 2016-12-06 LAB — AEROBIC/ANAEROBIC CULTURE (SURGICAL/DEEP WOUND)

## 2016-12-08 ENCOUNTER — Other Ambulatory Visit: Payer: Self-pay | Admitting: *Deleted

## 2016-12-08 ENCOUNTER — Encounter: Payer: Self-pay | Admitting: *Deleted

## 2016-12-08 LAB — BODY FLUID CULTURE

## 2016-12-08 MED FILL — ENOXAPARIN 80 MG/0.8 ML SYR: 80 | 30 days supply | Qty: 24 | Fill #0

## 2016-12-08 MED FILL — ZOLPIDEM TARTRATE 5 MG TAB: 5 | 30 days supply | Qty: 30 | Fill #2

## 2016-12-08 NOTE — Patient Outreach (Signed)
[facility]) Care Management  12/08/2016  [name] 10/25/1955 [id number]   Subjective: Telephone call to patient's home number, spoke with patient, and HIPAA verified.   Discussed [facility] Care Management [facility] Transition of care follow up and patient in agreement to complete. Patient states she is doing well, kind of sore, managing pain with medications, receiving home health services through [facility], and has follow up appointment with surgeon on 12/12/16.   States she is accessing the following [facility] benefits: outpatient pharmacy, and hospital indemnity supplemental insurance.  States she is covered under her husband's [employer] plan.  Patient states she does not have any education material, transition of care, care coordination, disease management, disease monitoring, transportation, community resource, or pharmacy needs at this time. States she is very appreciative of the follow up and is in agreement to receive [facility] information.     Objective: Per chart review: Patient hospitalized 12/02/16 -12/05/16 for Infected right total knee arthroplasty.  Status post EXCISIONAL TOTAL KNEE ARTHROPLASTY WITH ANTIBIOTIC SPACERS  on 12/02/16.   Status post Resection of right total knee arthroplasty, placement of antibiotic articulating spacer on 12/03/16. Patient also hospitalized 03/20/16 -03/23/16 for End-stage osteoarthrosis, valgus deformity of the right knee.   Status post Right total knee arthroplasty on 03/20/16.  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    Assessment: Received [facility] Transition of care referral on 12/04/16. Transition of care follow up completed, no care management needs, and will proceed with case closure.  Plan: RNCM will send patient successful outreach letter, [facility] pamphlet, and magnet. RNCM will send case closure due to follow up completed / no care  management needs request to [facility].   Alvah Lagrow H. Annia Friendly, BSN, [facility] Telephonic CM Phone: [phone] Fax: [phone]

## 2016-12-09 ENCOUNTER — Encounter: Payer: Self-pay | Admitting: Physical Therapy

## 2016-12-09 DIAGNOSIS — Z79891 Long term (current) use of opiate analgesic: Secondary | ICD-10-CM | POA: Diagnosis not present

## 2016-12-09 DIAGNOSIS — T8453XD Infection and inflammatory reaction due to internal right knee prosthesis, subsequent encounter: Secondary | ICD-10-CM | POA: Diagnosis not present

## 2016-12-09 DIAGNOSIS — Z792 Long term (current) use of antibiotics: Secondary | ICD-10-CM | POA: Diagnosis not present

## 2016-12-09 DIAGNOSIS — Z791 Long term (current) use of non-steroidal anti-inflammatories (NSAID): Secondary | ICD-10-CM | POA: Diagnosis not present

## 2016-12-09 DIAGNOSIS — Z5181 Encounter for therapeutic drug level monitoring: Secondary | ICD-10-CM | POA: Diagnosis not present

## 2016-12-10 DIAGNOSIS — Z5181 Encounter for therapeutic drug level monitoring: Secondary | ICD-10-CM | POA: Diagnosis not present

## 2016-12-10 DIAGNOSIS — T8453XD Infection and inflammatory reaction due to internal right knee prosthesis, subsequent encounter: Secondary | ICD-10-CM | POA: Diagnosis not present

## 2016-12-10 DIAGNOSIS — Z79891 Long term (current) use of opiate analgesic: Secondary | ICD-10-CM | POA: Diagnosis not present

## 2016-12-10 DIAGNOSIS — Z792 Long term (current) use of antibiotics: Secondary | ICD-10-CM | POA: Diagnosis not present

## 2016-12-10 DIAGNOSIS — Z791 Long term (current) use of non-steroidal anti-inflammatories (NSAID): Secondary | ICD-10-CM | POA: Diagnosis not present

## 2016-12-10 NOTE — Discharge Summary (Signed)
Physician Discharge Summary  Patient ID: [name] MRN: [id number] DOB/AGE: August 15, 1955 61 y.o.  Admit date: 12/02/2016 Discharge date: 12/05/2016   Procedures:  Procedure(s) (LRB): EXCISIONAL TOTAL KNEE ARTHROPLASTY WITH ANTIBIOTIC SPACERS (Right)  Attending Physician:  Dr. Paralee Cancel   Admission Diagnoses:   Infected right total knee arthroplasty  Discharge Diagnoses:  Principal Problem:   Right knee abx spacer  Past Medical History:  Diagnosis Date  . 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 Allergic reaction 07/24/2016  . Arthritis KNEES   right knee, hx. past left knee replacemnt.  . Blood clot in vein    around Portacath right chest-tx. Eliquis about a yr., prior Lovenox.  . Blood dyscrasia   . Chronic anticoagulation HX  PORT-A-CATH CLOT   2010 - HAS BEEN ON LOVENOX SINCE THE CLOT  . Drug rash 07/24/2016  . Elevated blood pressure reading without diagnosis of hypertension    PT MONITORS HER B/P AT HOME  . Heart murmur   . 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 PONV (postoperative nausea and vomiting)    ponv likes scopolamine patch  . 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 Status post skin flap graft    right chest wall with metastatis right anterior chest -no drainage or open wound.    HPI:    Pt is a 61 y.o. female complaining of right knee pain and swelling.  Patient fell and split open her knee soon after having a right TKA per Dr. Tonita Cong.  Subsequently she had at least 3 I&Ds  And wound vac placement.   She has had persistent drainage since.  Discussion with Dr. Alvan Dame went that route that the knee needs to be treated as an infected joint and will have to remove all  components and placement of an antibiotic spacer.  X-rays in the clinic show previous TKA of the right knee. Pt has tried various conservative treatments which have failed to alleviate their symptoms. Various options are discussed with the patient. Risks, benefits and expectations were discussed with the patient. Patient understand the risks, benefits and expectations and wishes to proceed with surgery.   PCP: Midge Minium, MD   Discharged Condition: good  Hospital Course:  Patient underwent the above stated procedure on 12/02/2016. Patient tolerated the procedure well and brought to the recovery room in good condition and subsequently to the floor.  POD #1 BP: 109/69 ; Pulse: 80 ; Temp: 98.5 F (36.9 C) ; Resp: 18 Patient reports pain as mild, pain controlled.  States that she is better than prior to surgery. No events throughout the night. Knows the plan with regards to IV antibiotics and eventually reimplantation of a TKA after treating the infection.  Neurovascular intact and incision: dressing C/D/I.  LABS  Basename    HGB     8.0  HCT     24.1   POD #2  BP: 110/58 ; Pulse: 78 ; Temp: 98 F (36.7 C) ; Resp: 17 Patient reports pain as mild.  Recognizes being afebrile since surgery, tolerating her post op course and spacer well.  Great attitude. Neurologically intact.  I  changed her dressing today and removed a couple sutures in mid portion of wound as it appeared to have increased skin edge tension.  Redressed with zeroform and gauze.  LABS  Basename    HGB     7.6  HCT     23.0   POD #3  BP: 129/59 ; Pulse: 101 ; Temp: 98.6 F (37 C) ; Resp: 17 Patient reports pain as mild. A little more discomfort today after therapy yesterday but otherwise ok.  No dizziness, SOB. Neurovascular intact.  Right knee dressing with moderate saturation related to procedure itself and anticoagulation Bruising around knee.  Bruising versus duskiness central incision stable versus some better  after suture removal yesterday.  LABS  Basename    HGB     7.4  HCT     23.1    Discharge Exam: General appearance: alert, cooperative and no distress Extremities: Homans sign is negative, no sign of DVT  Disposition: Home with follow up in 2 weeks   Follow-up Information    [facility].   Why:  nurse to assist with IV antibiotics Contact information: [place] [address] [phone]        Paralee Cancel, MD Follow up in 1 week(s).   Specialty:  Orthopedic Surgery Why:  for wound check Contact information: [address] [place] [address] [phone]           Discharge Instructions    AMB Referral to [facility]    Complete by:  As directed    Please assign UMR member for post discharge call. Currently at [facility]. Thanks Marthenia Rolling, [place], [facility] Liaison-[phone]   Reason for consult:  Please assign UMR member for post discharge call   Expected date of contact:  1-3 days (reserved for hospital discharges)   Call MD / Call 911    Complete by:  As directed    If you experience chest pain or shortness of breath, CALL 911 and be transported to the hospital emergency room.  If you develope a fever above 101 F, pus (white drainage) or increased drainage or redness at the wound, or calf pain, call your surgeon's office.   Constipation Prevention    Complete by:  As directed    Drink plenty of fluids.  Prune juice may be helpful.  You may use a stool softener, such as Colace (over the counter) 100 mg twice a day.  Use MiraLax (over the counter) for constipation as needed.   Diet - low sodium heart healthy    Complete by:  As directed    Discharge instructions    Complete by:  As directed    Daily dressing changes with gauze and tape, but must keep the area dry and clean.  Follow up in 1 weeks at [facility]. Call with any questions or  concerns.   Home infusion instructions [facility] May follow [place] Dosing Protocol; May administer Cathflo as needed to maintain patency of vascular access device.; Flushing of vascular access device: per [facility] Protocol: 0.9% NaCl pre/post medica...    Complete by:  As directed    Instructions:  May follow [place] Dosing Protocol   Instructions:  May administer Cathflo as needed to maintain patency of vascular access device.   Instructions:  Flushing of vascular access device: per [facility] Protocol: 0.9% NaCl pre/post medication administration and prn patency; Heparin 100 u/ml, 61m for implanted  ports and Heparin 10u/ml, 59m for all other central venous catheters.   Instructions:  May follow [facility] Anaphylaxis Protocol for First Dose Administration in the home: 0.9% NaCl at 25-50 ml/hr to maintain IV access for protocol meds. Epinephrine 0.3 ml IV/IM PRN and Benadryl 25-50 IV/IM PRN s/s of anaphylaxis.   Instructions:  [facility] (RN) to assist per patient IV care needs in the home PRN.   Partial weight bearing    Complete by:  As directed    % Body Weight:  50   Laterality:  right   Extremity:  Lower      Allergies as of 12/05/2016      Reactions   Penicillins Hives, Itching, Rash   Has patient had a PCN reaction causing immediate rash, facial/tongue/throat swelling, SOB or lightheadedness with hypotension: Yes Has patient had a PCN reaction causing severe rash involving mucus membranes or skin necrosis: No Has patient had a PCN reaction that required hospitalization No Has patient had a PCN reaction occurring within the last 10 years: No If all of the above answers are "NO", then may proceed with Cephalosporin use. Denies airway involvement   Other Rash   STERI STRIPS - Blisters   Tape Hives   Can tolerate paper tape      Medication List    STOP taking these medications   linezolid 600 MG tablet Commonly known as:  ZYVOX   metroNIDAZOLE 500  MG tablet Commonly known as:  FLAGYL   traMADol 50 MG tablet Commonly known as:  ULTRAM   triamcinolone ointment 0.1 % Commonly known as:  KENALOG     TAKE these medications   acetaminophen 500 MG tablet Commonly known as:  TYLENOL Take 2 tablets (1,000 mg total) by mouth every 8 (eight) hours. What changed:  medication strength  how much to take  when to take this  reasons to take this  Another medication with the same name was removed. Continue taking this medication, and follow the directions you see here.   cefTRIAXone IVPB Commonly known as:  ROCEPHIN Inject 2 g into the vein daily. Indication:  Prosthetic Joint Infection Last Day of Therapy:  01/13/17 Labs - Once weekly:  CBC/D and BMP, Labs - Every other week:  ESR and CRP   celecoxib 200 MG capsule Commonly known as:  CELEBREX Take 1 capsule (200 mg total) by mouth 2 (two) times daily. What changed:  when to take this  reasons to take this   darifenacin 15 MG 24 hr tablet Commonly known as:  ENABLEX Take 15 mg by mouth daily.   docusate sodium 100 MG capsule Commonly known as:  COLACE Take 1 capsule (100 mg total) by mouth 2 (two) times daily as needed for mild constipation. What changed:  when to take this   enoxaparin 80 MG/0.8ML injection Commonly known as:  LOVENOX Inject 70 mg into the skin daily.   ferrous sulfate 325 (65 FE) MG tablet Take 1 tablet (325 mg total) by mouth 3 (three) times daily after meals.   gabapentin 300 MG capsule Commonly known as:  NEURONTIN TAKE 1 CAPSULE BY MOUTH 3 TIMES DAILY   KADCYLA IV Inject into the vein every 21 ( twenty-one) days.   methocarbamol 500 MG tablet Commonly known as:  ROBAXIN Take 1 tablet (500 mg total) by mouth every 6 (six) hours as needed for muscle spasms.   ondansetron 8 MG tablet Commonly known as:  ZOFRAN Take 1 tablet (8 mg  total) by mouth every 8 (eight) hours as needed for nausea.   oxyCODONE 5 MG immediate release  tablet Commonly known as:  Oxy IR/ROXICODONE Take 1-3 tablets (5-15 mg total) by mouth every 4 (four) hours as needed for severe pain. What changed:  how much to take  reasons to take this   polyethylene glycol packet Commonly known as:  MIRALAX / GLYCOLAX Take 17 g by mouth 2 (two) times daily. What changed:  when to take this   vancomycin IVPB Inject 1,000 mg into the vein every 12 (twelve) hours. Indication:  Prosthetic Joint Infection Last Day of Therapy:  01/13/17 Labs - _0 /13/18 1121       Signed: [place] Pugh. Rumeal Cullipher   PA-C  12/10/2016, 11:51 AM

## 2016-12-11 ENCOUNTER — Telehealth: Payer: Self-pay | Admitting: Emergency Medicine

## 2016-12-11 NOTE — Telephone Encounter (Signed)
Spoke with patient regarding labs, reports she is asymptomatic. Advised she will be contacted regarding possible transfusion. Patient verbalized understanding.  Spoke with Dr. Aurea Graff nurse, advised of critical lab results. Dr. Alvan Dame is being notified. Nurse to follow up with patient (and PCP office) regarding plan.

## 2016-12-11 NOTE — Telephone Encounter (Signed)
Lucky at [facility] called with Critical labs HGB: 6.2 HCT:19.2 Sed Rate: 70 A copy of results will be faxed to office. Please advise.

## 2016-12-11 NOTE — Telephone Encounter (Signed)
With Hgb of 6.2, she likely needs a transfusion.  Please call pt to assess for sxs- SOB, CP, palpitations, dizziness, active bleeding, etc.  Please notify Dr Aurea Graff office ([facility]- he was the surgeon) and Dr Hale Bogus office ([facility]) to see if they have a protocol or plan for transfusion (inpt vs outpt)

## 2016-12-11 NOTE — Telephone Encounter (Signed)
Dr. Aurea Graff office returned call, patient will start Iron supplements today, and hold Lovenox today and the day of surgery (12/16/16). Dr. Aurea Graff nurse Mendel Ryder) to contact patient.

## 2016-12-11 NOTE — Telephone Encounter (Signed)
Thank you :)

## 2016-12-12 ENCOUNTER — Encounter (HOSPITAL_COMMUNITY): Payer: Self-pay | Admitting: *Deleted

## 2016-12-12 ENCOUNTER — Emergency Department (HOSPITAL_COMMUNITY): Payer: 59

## 2016-12-12 ENCOUNTER — Inpatient Hospital Stay (HOSPITAL_COMMUNITY)
Admission: EM | Admit: 2016-12-12 | Discharge: 2016-12-19 | DRG: 486 | Disposition: A | Discharge status: Home / Self Care | Payer: 59 | Attending: Internal Medicine | Admitting: Internal Medicine

## 2016-12-12 ENCOUNTER — Telehealth: Payer: Self-pay | Admitting: Emergency Medicine

## 2016-12-12 ENCOUNTER — Other Ambulatory Visit: Payer: Self-pay

## 2016-12-12 DIAGNOSIS — N390 Urinary tract infection, site not specified: Secondary | ICD-10-CM | POA: Diagnosis not present

## 2016-12-12 DIAGNOSIS — D638 Anemia in other chronic diseases classified elsewhere: Secondary | ICD-10-CM | POA: Diagnosis present

## 2016-12-12 DIAGNOSIS — Z820 Family history of epilepsy and other diseases of the nervous system: Secondary | ICD-10-CM | POA: Diagnosis not present

## 2016-12-12 DIAGNOSIS — C7989 Secondary malignant neoplasm of other specified sites: Secondary | ICD-10-CM | POA: Diagnosis present

## 2016-12-12 DIAGNOSIS — R0602 Shortness of breath: Secondary | ICD-10-CM

## 2016-12-12 DIAGNOSIS — M009 Pyogenic arthritis, unspecified: Secondary | ICD-10-CM | POA: Diagnosis not present

## 2016-12-12 DIAGNOSIS — E871 Hypo-osmolality and hyponatremia: Secondary | ICD-10-CM | POA: Diagnosis present

## 2016-12-12 DIAGNOSIS — Z79899 Other long term (current) drug therapy: Secondary | ICD-10-CM

## 2016-12-12 DIAGNOSIS — Z86718 Personal history of other venous thrombosis and embolism: Secondary | ICD-10-CM

## 2016-12-12 DIAGNOSIS — F411 Generalized anxiety disorder: Secondary | ICD-10-CM | POA: Diagnosis present

## 2016-12-12 DIAGNOSIS — Z803 Family history of malignant neoplasm of breast: Secondary | ICD-10-CM | POA: Diagnosis not present

## 2016-12-12 DIAGNOSIS — T814XXA Infection following a procedure, initial encounter: Secondary | ICD-10-CM | POA: Diagnosis not present

## 2016-12-12 DIAGNOSIS — M96840 Postprocedural hematoma of a musculoskeletal structure following a musculoskeletal system procedure: Secondary | ICD-10-CM | POA: Diagnosis not present

## 2016-12-12 DIAGNOSIS — T8450XA Infection and inflammatory reaction due to unspecified internal joint prosthesis, initial encounter: Secondary | ICD-10-CM

## 2016-12-12 DIAGNOSIS — Z880 Allergy status to penicillin: Secondary | ICD-10-CM

## 2016-12-12 DIAGNOSIS — I340 Nonrheumatic mitral (valve) insufficiency: Secondary | ICD-10-CM | POA: Diagnosis present

## 2016-12-12 DIAGNOSIS — Z8249 Family history of ischemic heart disease and other diseases of the circulatory system: Secondary | ICD-10-CM | POA: Diagnosis not present

## 2016-12-12 DIAGNOSIS — Z9013 Acquired absence of bilateral breasts and nipples: Secondary | ICD-10-CM

## 2016-12-12 DIAGNOSIS — D649 Anemia, unspecified: Secondary | ICD-10-CM | POA: Diagnosis not present

## 2016-12-12 DIAGNOSIS — Y831 Surgical operation with implant of artificial internal device as the cause of abnormal reaction of the patient, or of later complication, without mention of misadventure at the time of the procedure: Secondary | ICD-10-CM | POA: Diagnosis present

## 2016-12-12 DIAGNOSIS — Z792 Long term (current) use of antibiotics: Secondary | ICD-10-CM | POA: Diagnosis not present

## 2016-12-12 DIAGNOSIS — S8001XA Contusion of right knee, initial encounter: Secondary | ICD-10-CM | POA: Diagnosis present

## 2016-12-12 DIAGNOSIS — Z85828 Personal history of other malignant neoplasm of skin: Secondary | ICD-10-CM | POA: Diagnosis not present

## 2016-12-12 DIAGNOSIS — R9431 Abnormal electrocardiogram [ECG] [EKG]: Secondary | ICD-10-CM | POA: Diagnosis not present

## 2016-12-12 DIAGNOSIS — Z89529 Acquired absence of unspecified knee: Secondary | ICD-10-CM | POA: Diagnosis present

## 2016-12-12 DIAGNOSIS — T8130XA Disruption of wound, unspecified, initial encounter: Secondary | ICD-10-CM | POA: Diagnosis not present

## 2016-12-12 DIAGNOSIS — Z5181 Encounter for therapeutic drug level monitoring: Secondary | ICD-10-CM | POA: Diagnosis not present

## 2016-12-12 DIAGNOSIS — J329 Chronic sinusitis, unspecified: Secondary | ICD-10-CM | POA: Diagnosis not present

## 2016-12-12 DIAGNOSIS — M00861 Arthritis due to other bacteria, right knee: Secondary | ICD-10-CM | POA: Diagnosis not present

## 2016-12-12 DIAGNOSIS — T8453XA Infection and inflammatory reaction due to internal right knee prosthesis, initial encounter: Secondary | ICD-10-CM | POA: Diagnosis not present

## 2016-12-12 DIAGNOSIS — Z79891 Long term (current) use of opiate analgesic: Secondary | ICD-10-CM | POA: Diagnosis not present

## 2016-12-12 DIAGNOSIS — D62 Acute posthemorrhagic anemia: Secondary | ICD-10-CM | POA: Diagnosis present

## 2016-12-12 DIAGNOSIS — C50919 Malignant neoplasm of unspecified site of unspecified female breast: Secondary | ICD-10-CM | POA: Diagnosis present

## 2016-12-12 DIAGNOSIS — Z9889 Other specified postprocedural states: Secondary | ICD-10-CM | POA: Diagnosis not present

## 2016-12-12 DIAGNOSIS — T8453XD Infection and inflammatory reaction due to internal right knee prosthesis, subsequent encounter: Secondary | ICD-10-CM | POA: Diagnosis not present

## 2016-12-12 DIAGNOSIS — Z96653 Presence of artificial knee joint, bilateral: Secondary | ICD-10-CM | POA: Diagnosis present

## 2016-12-12 DIAGNOSIS — I517 Cardiomegaly: Secondary | ICD-10-CM | POA: Diagnosis not present

## 2016-12-12 DIAGNOSIS — M2500 Hemarthrosis, unspecified joint: Secondary | ICD-10-CM | POA: Diagnosis present

## 2016-12-12 DIAGNOSIS — Z9221 Personal history of antineoplastic chemotherapy: Secondary | ICD-10-CM | POA: Diagnosis not present

## 2016-12-12 DIAGNOSIS — Z171 Estrogen receptor negative status [ER-]: Secondary | ICD-10-CM

## 2016-12-12 DIAGNOSIS — Z791 Long term (current) use of non-steroidal anti-inflammatories (NSAID): Secondary | ICD-10-CM | POA: Diagnosis not present

## 2016-12-12 DIAGNOSIS — Z96651 Presence of right artificial knee joint: Secondary | ICD-10-CM | POA: Diagnosis present

## 2016-12-12 DIAGNOSIS — Z888 Allergy status to other drugs, medicaments and biological substances status: Secondary | ICD-10-CM

## 2016-12-12 DIAGNOSIS — Z7901 Long term (current) use of anticoagulants: Secondary | ICD-10-CM

## 2016-12-12 DIAGNOSIS — T8189XA Other complications of procedures, not elsewhere classified, initial encounter: Secondary | ICD-10-CM | POA: Diagnosis not present

## 2016-12-12 LAB — URINALYSIS, ROUTINE W REFLEX MICROSCOPIC
BILIRUBIN URINE: NEGATIVE
Glucose, UA: NEGATIVE mg/dL
HGB URINE DIPSTICK: NEGATIVE
Ketones, ur: NEGATIVE mg/dL
Leukocytes, UA: NEGATIVE
NITRITE: NEGATIVE
PROTEIN: NEGATIVE mg/dL
Specific Gravity, Urine: 1.01 (ref 1.005–1.030)
pH: 7 (ref 5.0–8.0)

## 2016-12-12 LAB — COMPREHENSIVE METABOLIC PANEL
ALBUMIN: 2.8 g/dL — AB (ref 3.5–5.0)
ALT: 24 U/L (ref 14–54)
AST: 46 U/L — AB (ref 15–41)
Alkaline Phosphatase: 121 U/L (ref 38–126)
Anion gap: 8 (ref 5–15)
BUN: 13 mg/dL (ref 6–20)
CHLORIDE: 99 mmol/L — AB (ref 101–111)
CO2: 28 mmol/L (ref 22–32)
CREATININE: 0.62 mg/dL (ref 0.44–1.00)
Calcium: 8.5 mg/dL — ABNORMAL LOW (ref 8.9–10.3)
GFR calc Af Amer: >60 mL/min (ref >60)
GFR calc non Af Amer: >60 mL/min (ref >60)
GLUCOSE: 99 mg/dL (ref 65–99)
POTASSIUM: 3.6 mmol/L (ref 3.5–5.1)
Sodium: 135 mmol/L (ref 135–145)
Total Bilirubin: 1.7 mg/dL — ABNORMAL HIGH (ref 0.3–1.2)
Total Protein: 6.7 g/dL (ref 6.5–8.1)

## 2016-12-12 LAB — CBC WITH DIFFERENTIAL/PLATELET
BASOS ABS: 0 10*3/uL (ref 0.0–0.1)
BASOS PCT: 1 %
Eosinophils Absolute: 0.1 10*3/uL (ref 0.0–0.7)
Eosinophils Relative: 1 %
HCT: 19.2 % — ABNORMAL LOW (ref 36.0–46.0)
Hemoglobin: 6.1 g/dL — CL (ref 12.0–15.0)
LYMPHS PCT: 16 %
Lymphs Abs: 1 10*3/uL (ref 0.7–4.0)
MCH: 30.5 pg (ref 26.0–34.0)
MCHC: 31.8 g/dL (ref 30.0–36.0)
MCV: 96 fL (ref 78.0–100.0)
MONO ABS: 0.4 10*3/uL (ref 0.1–1.0)
Monocytes Relative: 7 %
Neutro Abs: 4.7 10*3/uL (ref 1.7–7.7)
Neutrophils Relative %: 76 %
Platelets: 231 10*3/uL (ref 150–400)
RBC: 2 MIL/uL — AB (ref 3.87–5.11)
RDW: 23.2 % — AB (ref 11.5–15.5)
WBC: 6.2 10*3/uL (ref 4.0–10.5)

## 2016-12-12 LAB — PROTIME-INR
INR: 1.08
Prothrombin Time: 14 seconds (ref 11.4–15.2)

## 2016-12-12 LAB — MAGNESIUM: Magnesium: 1.8 mg/dL (ref 1.7–2.4)

## 2016-12-12 LAB — PREPARE RBC (CROSSMATCH)

## 2016-12-12 MED ORDER — SODIUM CHLORIDE 0.9 % IV SOLN
10.0000 mL/h | Freq: Once | INTRAVENOUS | Status: AC
  Administered 2016-12-12: 10 mL/h via INTRAVENOUS

## 2016-12-12 MED ORDER — FERROUS SULFATE 325 (65 FE) MG PO TABS
325.0000 mg | ORAL_TABLET | Freq: Three times a day (TID) | ORAL | Status: DC
  Administered 2016-12-12 – 2016-12-19 (×19): 325 mg via ORAL
  Filled 2016-12-12 (×19): qty 1

## 2016-12-12 MED ORDER — GABAPENTIN 300 MG PO CAPS
300.0000 mg | ORAL_CAPSULE | Freq: Three times a day (TID) | ORAL | Status: DC
  Administered 2016-12-12 – 2016-12-19 (×20): 300 mg via ORAL
  Filled 2016-12-12 (×20): qty 1

## 2016-12-12 MED ORDER — VITAMIN D3 25 MCG (1000 UNIT) PO TABS
2000.0000 [IU] | ORAL_TABLET | ORAL | Status: DC
  Administered 2016-12-13 – 2016-12-18 (×3): 2000 [IU] via ORAL
  Filled 2016-12-12 (×3): qty 2

## 2016-12-12 MED ORDER — VANCOMYCIN IV (FOR PTA / DISCHARGE USE ONLY): 1000.0000 mg | Freq: Two times a day (BID) | INTRAVENOUS | Status: DC

## 2016-12-12 MED ORDER — METHOCARBAMOL 500 MG PO TABS
500.0000 mg | ORAL_TABLET | Freq: Four times a day (QID) | ORAL | Status: DC | PRN
Start: 2016-12-12 — End: 2016-12-19
  Administered 2016-12-16 – 2016-12-19 (×5): 500 mg via ORAL
  Filled 2016-12-12 (×5): qty 1

## 2016-12-12 MED ORDER — ENOXAPARIN SODIUM 80 MG/0.8ML ~~LOC~~ SOLN
70.0000 mg | SUBCUTANEOUS | Status: DC
  Administered 2016-12-12 – 2016-12-13 (×2): 70 mg via SUBCUTANEOUS
  Filled 2016-12-12: qty 0.8
  Filled 2016-12-12: qty 0.7
  Filled 2016-12-12: qty 0.8

## 2016-12-12 MED ORDER — OXYCODONE HCL 5 MG PO TABS
5.0000 mg | ORAL_TABLET | ORAL | Status: DC | PRN
  Administered 2016-12-12 – 2016-12-13 (×3): 10 mg via ORAL
  Administered 2016-12-13: 15 mg via ORAL
  Administered 2016-12-13: 10 mg via ORAL
  Administered 2016-12-13 – 2016-12-14 (×6): 15 mg via ORAL
  Administered 2016-12-15: 10 mg via ORAL
  Administered 2016-12-15: 15 mg via ORAL
  Administered 2016-12-15 (×3): 10 mg via ORAL
  Administered 2016-12-16 (×3): 15 mg via ORAL
  Administered 2016-12-17: 5 mg via ORAL
  Administered 2016-12-17 (×2): 15 mg via ORAL
  Administered 2016-12-17 – 2016-12-18 (×3): 10 mg via ORAL
  Administered 2016-12-18 (×2): 15 mg via ORAL
  Administered 2016-12-18 (×2): 5 mg via ORAL
  Administered 2016-12-19 (×4): 10 mg via ORAL
  Filled 2016-12-12: qty 2
  Filled 2016-12-12: qty 3
  Filled 2016-12-12: qty 2
  Filled 2016-12-12 (×4): qty 3
  Filled 2016-12-12: qty 2
  Filled 2016-12-12: qty 3
  Filled 2016-12-12 (×3): qty 2
  Filled 2016-12-12: qty 3
  Filled 2016-12-12: qty 1
  Filled 2016-12-12 (×2): qty 2
  Filled 2016-12-12: qty 3
  Filled 2016-12-12: qty 2
  Filled 2016-12-12: qty 3
  Filled 2016-12-12: qty 2
  Filled 2016-12-12 (×5): qty 3
  Filled 2016-12-12 (×2): qty 1
  Filled 2016-12-12 (×3): qty 3
  Filled 2016-12-12 (×2): qty 2
  Filled 2016-12-12 (×2): qty 3

## 2016-12-12 MED ORDER — CEFTRIAXONE IV (FOR PTA / DISCHARGE USE ONLY): 2.0000 g | INTRAVENOUS | Status: DC

## 2016-12-12 MED ORDER — POLYETHYLENE GLYCOL 3350 17 G PO PACK
17.0000 g | PACK | Freq: Two times a day (BID) | ORAL | Status: DC
  Administered 2016-12-12 – 2016-12-19 (×10): 17 g via ORAL
  Filled 2016-12-12 (×10): qty 1

## 2016-12-12 MED ORDER — DARIFENACIN HYDROBROMIDE ER 15 MG PO TB24
15.0000 mg | ORAL_TABLET | Freq: Every day | ORAL | Status: DC
  Administered 2016-12-13 – 2016-12-19 (×6): 15 mg via ORAL
  Filled 2016-12-12 (×7): qty 1

## 2016-12-12 MED ORDER — ZOLPIDEM TARTRATE 5 MG PO TABS
5.0000 mg | ORAL_TABLET | Freq: Every evening | ORAL | Status: DC | PRN
  Administered 2016-12-12 – 2016-12-15 (×2): 5 mg via ORAL
  Filled 2016-12-12 (×2): qty 1

## 2016-12-12 MED ORDER — VANCOMYCIN HCL IN DEXTROSE 1-5 GM/200ML-% IV SOLN
1000.0000 mg | Freq: Two times a day (BID) | INTRAVENOUS | Status: DC
  Administered 2016-12-13 – 2016-12-19 (×13): 1000 mg via INTRAVENOUS
  Filled 2016-12-12 (×15): qty 200

## 2016-12-12 MED ORDER — CEFTRIAXONE SODIUM 2 G IJ SOLR
2.0000 g | INTRAMUSCULAR | Status: DC
  Administered 2016-12-13 – 2016-12-18 (×7): 2 g via INTRAVENOUS
  Filled 2016-12-12 (×9): qty 2

## 2016-12-12 MED ORDER — OXYCODONE HCL 5 MG PO TABS
10.0000 mg | ORAL_TABLET | Freq: Once | ORAL | Status: AC
  Administered 2016-12-12: 10 mg via ORAL
  Filled 2016-12-12: qty 2

## 2016-12-12 NOTE — ED Notes (Signed)
Bed: WA08 Expected date:  Expected time:  Means of arrival:  Comments: [place]

## 2016-12-12 NOTE — ED Provider Notes (Signed)
[facility] Provider Note   CSN: [id number] Arrival date & time: 12/12/16  1215     History   Chief Complaint Chief Complaint  Patient presents with  . Abnormal Lab    HPI [name] is a 61 y.o. female.  HPI   61 yo F with PMHx as below including metastatic breast CA, R knee replacement c/b recurrent infections, s/p recent revision and placement of ABX spacer, on Vanc BID, here with anemia. Pt has had routine labs checked for the past several weeks due to taking Vancomycin, as well as 2/2 large hematomas in R knee for which Dr. Alvan Dame has been following her. Over the past week, she has had generalized weakness and fatigue. Her Hgb dropped from >7 to 5.4 on check yesterday so she was told to present for evaluation. She has associated tachycardia, fatigue. She has mild lightheadedness/dizziness with standing. No SOB. No CP. No blood thinner use. She does report worsening R knee pain with increasing skin break down at her op site.  Past Medical History:  Diagnosis Date  . 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 Allergic reaction 07/24/2016  . Arthritis KNEES   right knee, hx. past left knee replacemnt.  . Blood clot in vein    around Portacath right chest-tx. Eliquis about a yr., prior Lovenox.  . Blood dyscrasia   . Chronic anticoagulation HX  PORT-A-CATH CLOT   2010 - HAS BEEN ON LOVENOX SINCE THE CLOT  . Drug rash 07/24/2016  . Elevated blood pressure reading without diagnosis of hypertension    PT MONITORS HER B/P AT HOME  . Heart murmur   . 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 PONV (postoperative nausea and vomiting)    ponv likes scopolamine patch  . Skin cancer of anterior chest BREAST CANCER PRIMARY W/ METS TO CHEST WALL SKIN CANCER--  CHEMOTHERAPY EVERY 3 WEEKS AT [facility]   left 9 yrs ago, chemo every 3 weeks at [facility], last  chemo july 3rd  . Status post skin flap graft    right chest wall with metastatis right anterior chest -no drainage or open wound.    Patient Active Problem List   Diagnosis Date Noted  . Acute blood loss anemia 12/12/2016  . Right knee abx spacer 12/02/2016  . Physical exam 07/31/2016  . Allergic reaction 07/24/2016  . Drug rash 07/24/2016  . S/P total knee arthroplasty, right 05/31/2016  . Infection of prosthetic right knee joint ([place]) 05/31/2016  . Nonhealing surgical wound 05/28/2016  . Sepsis ([place]) 05/10/2016  . UTI (urinary tract infection) 05/10/2016  . Cellulitis of right knee 05/10/2016  . Blood clot in vein   . Wound dehiscence, surgical 04/12/2016  . 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 Vitamin D deficiency 01/25/2016  . Metastatic cancer to chest wall ([place]) 02/02/2015  . Primary osteoarthritis of right knee 02/01/2015  . Anemia 02/01/2015  . Frequent UTI 01/12/2014  . Sinus tachycardia 11/03/2013  . DOE (dyspnea on exertion) 11/03/2013  . Postoperative anemia due to acute blood loss 12/24/2012  .    09/23/2012  . Sinusitis 07/28/2012  . Neuropathy due to chemotherapeutic drug ([place]) 07/25/2011  . 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 Metastatic breast cancer ([place]) 11/12/2010  . Chronic anticoagulation 11/12/2010  . Anxiety, generalized 03/26/2008    Past Surgical History:  Procedure Laterality Date  . APPLICATION OF A-CELL OF BACK Right 07/31/2016   Procedure: CELLERATE COLLAGEN PLACEMENT;  Surgeon: Loel Lofty Dillingham, DO;  Location: [facility];  Service: Plastics;  Laterality: Right;  . CHEST WALL TUMOR EXCISION     right  . EXCISIONAL TOTAL KNEE ARTHROPLASTY WITH ANTIBIOTIC SPACERS Right 12/02/2016   Procedure: EXCISIONAL TOTAL KNEE ARTHROPLASTY WITH ANTIBIOTIC SPACERS;  Surgeon: Paralee Cancel, MD;  Location: [facility];  Service: Orthopedics;  Laterality: Right;  90 mins  . hemotoma     evacuation left chest wall  . I&D KNEE WITH POLY EXCHANGE Right 05/28/2016   Procedure:  IRRIGATION AND DEBRIDEMENT KNEE WOUND VAC PLACMENT;  Surgeon: Susa Day, MD;  Location: [facility];  Service: Orthopedics;  Laterality: Right;  . I&D KNEE WITH POLY EXCHANGE Right 05/30/2016   Procedure: RADICAL SYNOVECTOMY,IRRIGATION AND DEBRIDEMENT KNEE WITH POLY EXCHANGE WITH ANTIBIOTIC BEADS, APPLICATION OF WOUND VAC;  Surgeon: Susa Day, MD;  Location: [facility];  Service: Orthopedics;  Laterality: Right;  . INCISION AND DRAINAGE OF WOUND Right 07/31/2016   Procedure: IRRIGATION AND DEBRIDEMENT RIGHT KNEE  WOUND;  Surgeon: Loel Lofty Dillingham, DO;  Location: [facility];  Service: Plastics;  Laterality: Right;  . IRRIGATION AND DEBRIDEMENT KNEE Right 04/12/2016   Procedure: IRRIGATION AND DEBRIDEMENT KNEE;  Surgeon: Nicholes Stairs, MD;  Location: [facility];  Service: Orthopedics;  Laterality: Right;  . KNEE ARTHROSCOPY  02/13/2012   Procedure: ARTHROSCOPY KNEE;  Surgeon: Johnn Hai, MD;  Location: [facility];  Service: Orthopedics;  Laterality: Left;  WITH DEBRIDEMENt   . KNEE ARTHROSCOPY WITH LATERAL MENISECTOMY  02/13/2012   Procedure: KNEE ARTHROSCOPY WITH LATERAL MENISECTOMY;  Surgeon: Johnn Hai, MD;  Location: [facility];  Service: Orthopedics;;  partial  . LEFT MODIFIED RADICAL MASTECTOMY/ RIGHT TOTAL MASTECTOMY  11-13-2007   LEFT BREAST CANCER W/ AXILLARY LYMPH NODE METASTASIS AND POST NEOADJUVANT CHEMO  . PLACEMENT PORT-A-CATH  06/22/2007   right chest new port placed 2015, right chest  . SKIN GRAFT     chest-post tumor removal, second occurrence of cancer" 2015 surgery for Flap 2015"  . TOTAL KNEE ARTHROPLASTY Left 09/23/2012   Procedure: LEFT TOTAL KNEE ARTHROPLASTY;  Surgeon: Johnn Hai, MD;  Location[facility];  Service: Orthopedics;  Laterality: Left;  . TOTAL KNEE ARTHROPLASTY Right 03/20/2016   Procedure: RIGHT TOTAL KNEE ARTHROPLASTY;  Surgeon: Susa Day, MD;  Location[facility];  Service: Orthopedics;  Laterality: Right;  .  TRANSTHORACIC ECHOCARDIOGRAM  11-18-2011  DR BENSIMHON (ECHO EVERY 3 MONTHS)   HX CHEMO INDUCED CARDIOTOXICITY/  LVSF NORMAL/ EF 55-50%/ MILD MITRIAL VALVE REGURG./ MILDLY INCREASED SYSTOLIC PRESSURE OF PULMONARY ARTERIES  . VASCULAR SURGERY Right    portacath chest    OB History    No data available       Home Medications    Prior to Admission medications   Medication Sig Start Date End Date Taking? Authorizing Provider  acetaminophen (TYLENOL) 500 MG tablet Take 2 tablets (1,000 mg total) by mouth every 8 (eight) hours. 12/05/16  Yes Babish, Rodman Key, PA-C  Ado-Trastuzumab Emtansine (KADCYLA IV) Inject into the vein every 21 ( twenty-one) days.   Yes [provider]  cefTRIAXone (ROCEPHIN) IVPB Inject 2 g into the vein daily. Indication:  Prosthetic Joint Infection Last Day of Therapy:  01/13/17 Labs - Once weekly:  CBC/D and BMP, Labs - Every other week:  ESR and CRP 12/06/16 01/13/17 Yes Babish, Rodman Key, PA-C  Cholecalciferol (VITAMIN  D3) 2000 UNITS TABS Take 1 tablet by mouth 4 (four) times a week.    Yes [provider]  darifenacin (ENABLEX) 15 MG 24 hr tablet Take 15 mg by mouth daily.   Yes [provider]  docusate sodium (COLACE) 100 MG capsule Take 1 capsule (100 mg total) by mouth 2 (two) times daily as needed for mild constipation. Patient taking differently: Take 100 mg by mouth 2 (two) times daily.  03/20/16  Yes Susa Day, MD  ferrous sulfate 325 (65 FE) MG tablet Take 1 tablet (325 mg total) by mouth 3 (three) times daily after meals. 12/05/16  Yes Babish, Rodman Key, PA-C  gabapentin (NEURONTIN) 300 MG capsule TAKE 1 CAPSULE BY MOUTH 3 TIMES DAILY 02/11/16  Yes Midge Minium, MD  methocarbamol (ROBAXIN) 500 MG tablet Take 1 tablet (500 mg total) by mouth every 6 (six) hours as needed for muscle spasms. 12/05/16  Yes Babish, Rodman Key, PA-C  metroNIDAZOLE (FLAGYL) 500 MG tablet Take 500 mg by mouth 3 (three) times daily. 11/17/16  Yes [provider]  ondansetron (ZOFRAN) 8 MG tablet Take 1 tablet (8 mg total) by mouth every 8 (eight) hours as needed for nausea. 05/23/16  Yes Midge Minium, MD  oxyCODONE (OXY IR/ROXICODONE) 5 MG immediate release tablet Take 1-3 tablets (5-15 mg total) by mouth every 4 (four) hours as needed for severe pain. 12/05/16  Yes Babish, Rodman Key, PA-C  polyethylene glycol (MIRALAX / GLYCOLAX) packet Take 17 g by mouth 2 (two) times daily. 12/05/16  Yes Babish, Rodman Key, PA-C  vancomycin IVPB Inject 1,000 mg into the vein every 12 (twelve) hours. Indication:  Prosthetic Joint Infection Last Day of Therapy:  01/13/17 Labs - Sunday/Monday:  CBC/D, BMP, and vancomycin trough. Labs - Thursday:  BMP and vancomycin trough Labs - Every other week:  ESR and CRP 12/05/16 01/13/17 Yes Babish, Rodman Key, PA-C  zolpidem (AMBIEN) 5 MG tablet TAKE 1 TABLET BY MOUTH ONCE DAILY AT BEDTIME AS NEEDED FOR SLEEP 10/02/16  Yes Midge Minium, MD  celecoxib (CELEBREX) 200 MG capsule Take 1 capsule (200 mg total) by mouth 2 (two) times daily. Patient taking differently: Take 200 mg by mouth daily as needed.  10/10/16   Midge Minium, MD  enoxaparin (LOVENOX) 80 MG/0.8ML injection Inject 70 mg into the skin daily.    [provider]    Family History Family History  Problem Relation Age of Onset  . Heart disease Mother        due to mitral valve regurgiation,   . Cancer Mother        breast  . Allergic rhinitis Mother   . Parkinsonism Father   . Allergic rhinitis Father   . Migraines Father   . Alcohol abuse Paternal Grandfather   . Angioedema Neg Hx   . Asthma Neg Hx   . Eczema Neg Hx     Social History Social History  Substance Use Topics  . Smoking status: Never Smoker  . Smokeless tobacco: Never Used  . Alcohol use No     Allergies   Penicillins; Other; and Tape   Review of Systems Review of Systems  Constitutional: Positive for fatigue.  Respiratory: Positive for shortness of  breath (with exertion).   Neurological: Positive for weakness and light-headedness.  All other systems reviewed and are negative.    Physical Exam Updated Vital Signs BP 121/69 (BP Location: Right Arm)   Pulse (!) 110   Temp 100.1 F (37.8 C) (Oral)  Resp 18   Ht '5\' 7"'$  (1.702 m)   Wt 70.3 kg (155 lb)   LMP 06/27/2007   SpO2 93%   BMI 24.28 kg/m   Physical Exam  Constitutional: She is oriented to person, place, and time. She appears well-developed and well-nourished. No distress.  HENT:  Head: Normocephalic and atraumatic.  Mouth/Throat: Oropharynx is clear and moist.  Eyes: Conjunctivae are normal.  Neck: Neck supple.  Cardiovascular: Regular rhythm and normal heart sounds.  Tachycardia present.  Exam reveals no friction rub.   No murmur heard. Pulmonary/Chest: Effort normal and breath sounds normal. No respiratory distress. She has no wheezes. She has no rales.  Abdominal: Soft. She exhibits no distension.  Musculoskeletal: She exhibits edema and tenderness.  Neurological: She is alert and oriented to person, place, and time. She exhibits normal muscle tone.  Skin: Skin is warm. Capillary refill takes less than 2 seconds.  Psychiatric: She has a normal mood and affect.  Nursing note and vitals reviewed.   LOWER EXTREMITY EXAM: RIGHT  INSPECTION & PALPATION: Marked swelling to right knee. Recent operative site c/d/i proximally, with large underlying hematoma and mild wound breakdown along inferior portion. No expressible purulence. No apparent active external bleeding. Marked, diffuse TTP about knee.  SENSORY: sensation is intact to light touch in:  Superficial peroneal nerve distribution (over dorsum of foot) Deep peroneal nerve distribution (over first dorsal web space) Sural nerve distribution (over lateral aspect 5th metatarsal) Saphenous nerve distribution (over medial instep)  MOTOR:  + Motor EHL (great toe dorsiflexion) + FHL (great toe plantar flexion)  +  TA (ankle dorsiflexion)  + GSC (ankle plantar flexion)  VASCULAR: 2+ dorsalis pedis and posterior tibialis pulses Capillary refill < 2 sec, toes warm and well-perfused  COMPARTMENTS: Soft, warm, well-perfused No pain with passive extension No parethesias    ED Treatments / Results  Labs (all labs ordered are listed, but only abnormal results are displayed) Labs Reviewed  CBC WITH DIFFERENTIAL/PLATELET - Abnormal; Notable for the following:       Result Value   RBC 2.00 (*)    Hemoglobin 6.1 (*)    HCT 19.2 (*)    RDW 23.2 (*)    All other components within normal limits  COMPREHENSIVE METABOLIC PANEL - Abnormal; Notable for the following:    Chloride 99 (*)    Calcium 8.5 (*)    Albumin 2.8 (*)    AST 46 (*)    Total Bilirubin 1.7 (*)    All other components within normal limits  COMPREHENSIVE METABOLIC PANEL - Abnormal; Notable for the following:    Sodium 134 (*)    Chloride 97 (*)    Glucose, Bld 101 (*)    Calcium 8.3 (*)    Albumin 2.7 (*)    AST 46 (*)    Alkaline Phosphatase 127 (*)    Total Bilirubin 2.2 (*)    All other components within normal limits  CBC - Abnormal; Notable for the following:    RBC 2.99 (*)    Hemoglobin 8.9 (*)    HCT 27.5 (*)    RDW 22.0 (*)    All other components within normal limits  CULTURE, BLOOD (ROUTINE X 2)  CULTURE, BLOOD (ROUTINE X 2)  PROTIME-INR  URINALYSIS, ROUTINE W REFLEX MICROSCOPIC  HIV ANTIBODY (ROUTINE TESTING)  MAGNESIUM  VANCOMYCIN, TROUGH  TYPE AND SCREEN  PREPARE RBC (CROSSMATCH)    EKG  EKG Interpretation None       Radiology Dg  Chest Port 1 View  Result Date: 12/12/2016 CLINICAL DATA:  61 year old female with history of fever for the past 2 days. Fatigue and weakness. History breast cancer. EXAM: PORTABLE CHEST 1 VIEW COMPARISON:  Chest x-ray 05/10/2016. FINDINGS: Lung volumes are slightly low. No acute consolidative airspace disease. No pleural effusions. No evidence of pulmonary edema.  However, the central pulmonary arteries are very prominent. Heart size is mildly enlarged. The patient is rotated to the right on today's exam, resulting in distortion of the mediastinal contours and reduced diagnostic sensitivity and specificity for mediastinal pathology. Right internal jugular single-lumen porta cath with tip terminating at the superior cavoatrial junction. Surgical clips noted throughout the chest wall and in the axillary regions bilaterally. IMPRESSION: 1. No definite radiographic evidence of acute cardiopulmonary disease. 2. Cardiomegaly. 3. Dilatation of the proximal pulmonary arteries, concerning for pulmonary arterial hypertension. Electronically Signed   By: Vinnie Langton M.D.   On: 12/12/2016 16:16    Procedures .Critical Care Performed by: Duffy Bruce Authorized by: Duffy Bruce   Critical care provider statement:    Critical care time (minutes):  35   (including critical care time)  CRITICAL CARE Performed by: Evonnie Pat   Total critical care time: 35 minutes  Critical care time was exclusive of separately billable procedures and treating other patients.  Critical care was necessary to treat or prevent imminent or life-threatening deterioration.  Critical care was time spent personally by me on the following activities: development of treatment plan with patient and/or surrogate as well as nursing, discussions with consultants, evaluation of patient's response to treatment, examination of patient, obtaining history from patient or surrogate, ordering and performing treatments and interventions, ordering and review of laboratory studies, ordering and review of radiographic studies, pulse oximetry and re-evaluation of patient's condition.    Medications Ordered in ED Medications  ferrous sulfate tablet 325 mg (325 mg Oral Given by Other 12/12/16 2004)  methocarbamol (ROBAXIN) tablet 500 mg (not administered)  oxyCODONE (Oxy IR/ROXICODONE)  immediate release tablet 5-15 mg (10 mg Oral Given 12/13/16 0508)  polyethylene glycol (MIRALAX / GLYCOLAX) packet 17 g (17 g Oral Given by Other 12/12/16 2003)  zolpidem (AMBIEN) tablet 5 mg (5 mg Oral Given 12/12/16 2117)  gabapentin (NEURONTIN) capsule 300 mg (300 mg Oral Given by Other 12/12/16 2004)  darifenacin (ENABLEX) 24 hr tablet 15 mg (not administered)  cholecalciferol (VITAMIN D) tablet 2,000 Units (not administered)  vancomycin (VANCOCIN) IVPB 1000 mg/200 mL premix (0 mg Intravenous Stopped 12/13/16 0231)  enoxaparin (LOVENOX) injection 70 mg (70 mg Subcutaneous Given by Other 12/12/16 2002)  cefTRIAXone (ROCEPHIN) 2 g in dextrose 5 % 50 mL IVPB (0 g Intravenous Stopped 12/13/16 0046)  sodium chloride flush (NS) 0.9 % injection 10-40 mL (not administered)  0.9 %  sodium chloride infusion (0 mL/hr Intravenous Stopped 12/12/16 1707)  oxyCODONE (Oxy IR/ROXICODONE) immediate release tablet 10 mg (10 mg Oral Given 12/12/16 1408)     Initial Impression / Assessment and Plan / ED Course  I have reviewed the triage vital signs and the nursing notes.  Pertinent labs & imaging results that were available during my care of the patient were reviewed by me and considered in my medical decision making (see chart for details).    61 yo F with metastatic breast CA, recent TKR complicated by septic arthritis on Vanc, here with symptomatic, acute on chronic anemia. I suspect this is 2/2 hematoma/blood loss from surgery, on top of chemo- and chronic disease-induced anemia. No signs  of active external bleeding. She is tachycardic but mentating well, normal BP. Hgb 6.1 on re-check here. She is already on Vanc for her infection. Will type and screen, transfuse 2u pRBC, and admit. Ortho Dr. Alvan Dame aware, will follow in hospital with plan to take back to OR prior to d/c.  Final Clinical Impressions(s) / ED Diagnoses   Final diagnoses:  Symptomatic anemia  Pyogenic arthritis of right knee joint, due to  unspecified organism [facility])    New Prescriptions Current Discharge Medication List       Duffy Bruce, MD 12/13/16 [phone]

## 2016-12-12 NOTE — Telephone Encounter (Signed)
Luck at [facility] calling with Critical lab. HGB:5.4 HCT::17.7   Please advise

## 2016-12-12 NOTE — H&P (Addendum)
[facility] History and Physical  [name] QZE:[id number] DOB: [birth year] DOA: 12/12/2016  Referring physician: ER  PCP: Midge Minium, MD   Chief Complaint: Anemia   HPI:   61 year old female  61 y.o. female with a history of breast cancer followed at [facility] by Force, Delice Bison, MD, status post most recent chemotherapy on 11/25/16, prior history of DVT (on eliquis which was switched to Lovenox in June 2018)  , left knee arthroplasty in 2009 and then right TKA 22/63/3354 complicated by a fall and underwent 3  incisions and drainage since  November 2018.  She took cephalexin after that but had persistent drainage with cellulitis and treated with a short course of vancomycin + aztreonam and transitioned to cipro for possible UTI but continued drainage and readmitted in January 2018.  She underwent I and D and poly-exchange and VAC placed and joint cultures grew Enterococcus faecalis and completed 6 weeks of IV vancomcyin and transitioned to oral amoxicillin after an allergy test for penicillin but developed hives.   She then transitioned to oral linezolid and took about 6 weeks but stopped on her own with concern for side effects.  She did require surgery on her wound by Dr. Baltazar Apo in March and had done well but developed a hematoma after bumping her knee in June and was debrided and grew an anaerob and was put on metronidazole which she took until 3 weeks ago.   she was readmitted in July and underwent resection of right total knee arthroplasty with placement of antibiotic spacer by Dr. Alvan Dame.   She was discharged on 12/05/16 with IV vancomycin and Rocephin to be completed through 01/13/17 through home health. Patient has only been receiving IV vancomycin since her discharge on 7/13, family and Dr Alvan Dame do not know why . Dr. Alvan Dame has been following her hemoglobin and a hemoglobin drop from 7-5.4 on blood work yesterday. Patient was advised to come to the ED. She was advised to hold Lovenox  and she has not taken any in the last 24 hours. ED course Patient presents today with right knee pain, also states that she's had a fever for the last 2 days, she has been fatigued, she is generally weak, lightheaded, BP 131/72 (BP Location: Right Arm)   Pulse (!) 105   Resp 18   Hemoglobin is 6.1 today, patient has had no side effects from her chemotherapy. She states that her oncologist wants to skip her next chemotherapy on 7/24. She  has noticed a fever for the last 2 days but has no focal symptoms to suggest UTI, no pulmonary symptoms. Chest x-ray is pending       Review of Systems: negative for the following   A complete 12 point review of systems was done with pertinent positives as documented in history of present illness      Past Medical History:  Diagnosis Date  . 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 Allergic reaction 07/24/2016  . Arthritis KNEES   right knee, hx. past left knee replacemnt.  . Blood clot in vein    around Portacath right chest-tx. Eliquis about a yr., prior Lovenox.  . Blood dyscrasia   . Chronic anticoagulation HX  PORT-A-CATH CLOT   2010 - HAS BEEN ON LOVENOX SINCE THE CLOT  . Drug rash 07/24/2016  . Elevated blood pressure reading without diagnosis of hypertension    PT MONITORS HER B/P AT HOME  . Heart murmur   . 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 PONV (postoperative nausea and vomiting)    ponv likes scopolamine patch  . Skin cancer of anterior chest BREAST CANCER PRIMARY W/ METS TO CHEST WALL SKIN CANCER--  CHEMOTHERAPY EVERY 3 WEEKS AT [facility]   left 9 yrs ago, chemo every 3 weeks at [place], last chemo july 3rd  . Status post skin flap graft    right chest wall with metastatis right anterior chest -no drainage or open wound.     Past Surgical History:  Procedure Laterality Date  . APPLICATION OF A-CELL  OF BACK Right 07/31/2016   Procedure: CELLERATE COLLAGEN PLACEMENT;  Surgeon: Loel Lofty Dillingham, DO;  Location: [facility];  Service: Plastics;  Laterality: Right;  . CHEST WALL TUMOR EXCISION     right  . EXCISIONAL TOTAL KNEE ARTHROPLASTY WITH ANTIBIOTIC SPACERS Right 12/02/2016   Procedure: EXCISIONAL TOTAL KNEE ARTHROPLASTY WITH ANTIBIOTIC SPACERS;  Surgeon: Paralee Cancel, MD;  Location: [facility];  Service: Orthopedics;  Laterality: Right;  90 mins  . hemotoma     evacuation left chest wall  . I&D KNEE WITH POLY EXCHANGE Right 05/28/2016   Procedure: IRRIGATION AND DEBRIDEMENT KNEE WOUND VAC PLACMENT;  Surgeon: Susa Day, MD;  Location: [facility];  Service: Orthopedics;  Laterality: Right;  . I&D KNEE WITH POLY EXCHANGE Right 05/30/2016   Procedure: RADICAL SYNOVECTOMY,IRRIGATION AND DEBRIDEMENT KNEE WITH POLY EXCHANGE WITH ANTIBIOTIC BEADS, APPLICATION OF WOUND VAC;  Surgeon: Susa Day, MD;  Location: [facility];  Service: Orthopedics;  Laterality: Right;  . INCISION AND DRAINAGE OF WOUND Right 07/31/2016   Procedure: IRRIGATION AND DEBRIDEMENT RIGHT KNEE  WOUND;  Surgeon: Loel Lofty Dillingham, DO;  Location: [facility];  Service: Plastics;  Laterality: Right;  . IRRIGATION AND DEBRIDEMENT KNEE Right 04/12/2016   Procedure: IRRIGATION AND DEBRIDEMENT KNEE;  Surgeon: Nicholes Stairs, MD;  Location: [facility];  Service: Orthopedics;  Laterality: Right;  . KNEE ARTHROSCOPY  02/13/2012   Procedure: ARTHROSCOPY KNEE;  Surgeon: Johnn Hai, MD;  Location: [facility];  Service: Orthopedics;  Laterality: Left;  WITH DEBRIDEMENt   . KNEE ARTHROSCOPY WITH LATERAL MENISECTOMY  02/13/2012   Procedure: KNEE ARTHROSCOPY WITH LATERAL MENISECTOMY;  Surgeon: Johnn Hai, MD;  Location: [facility];  Service: Orthopedics;;  partial  . LEFT MODIFIED RADICAL MASTECTOMY/ RIGHT TOTAL MASTECTOMY  11-13-2007   LEFT BREAST CANCER W/ AXILLARY LYMPH NODE METASTASIS AND POST NEOADJUVANT CHEMO  .  PLACEMENT PORT-A-CATH  06/22/2007   right chest new port placed 2015, right chest  . SKIN GRAFT     chest-post tumor removal, second occurrence of cancer" 2015 surgery for Flap 2015"  . TOTAL KNEE ARTHROPLASTY Left 09/23/2012   Procedure: LEFT TOTAL KNEE ARTHROPLASTY;  Surgeon: Johnn Hai, MD;  Location: [facility];  Service: Orthopedics;  Laterality: Left;  . TOTAL KNEE ARTHROPLASTY Right 03/20/2016   Procedure: RIGHT TOTAL KNEE ARTHROPLASTY;  Surgeon: Susa Day, MD;  Location: [facility];  Service: Orthopedics;  Laterality: Right;  . TRANSTHORACIC ECHOCARDIOGRAM  11-18-2011  DR BENSIMHON (ECHO EVERY 3 MONTHS)   HX CHEMO INDUCED CARDIOTOXICITY/  LVSF NORMAL/ EF 55-50%/ MILD MITRIAL VALVE REGURG./ MILDLY INCREASED SYSTOLIC PRESSURE OF PULMONARY ARTERIES  . VASCULAR SURGERY Right    portacath chest      Social History:  reports that she has never smoked. She  has never used smokeless tobacco. She reports that she does not drink alcohol or use drugs.    Allergies  Allergen Reactions  . Penicillins Hives, Itching and Rash    Has patient had a PCN reaction causing immediate rash, facial/tongue/throat swelling, SOB or lightheadedness with hypotension: no Has patient had a PCN reaction causing severe rash involving mucus membranes or skin necrosis: No Has patient had a PCN reaction that required hospitalization No Has patient had a PCN reaction occurring within the last 10 years: yes If all of the above answers are "NO", then may proceed with Cephalosporin use.   Denies airway involvement   . Other Rash    STERI STRIPS - Blisters  . Tape Hives    Can tolerate paper tape    Family History  Problem Relation Age of Onset  . Heart disease Mother        due to mitral valve regurgiation,   . Cancer Mother        breast  . Allergic rhinitis Mother   . Parkinsonism Father   . Allergic rhinitis Father   . Migraines Father   . Alcohol abuse Paternal Grandfather   . Angioedema Neg Hx    . Asthma Neg Hx   . Eczema Neg Hx          Prior to Admission medications   Medication Sig Start Date End Date Taking? Authorizing Provider  acetaminophen (TYLENOL) 500 MG tablet Take 2 tablets (1,000 mg total) by mouth every 8 (eight) hours. 12/05/16  Yes Babish, Rodman Key, PA-C  Ado-Trastuzumab Emtansine (KADCYLA IV) Inject into the vein every 21 ( twenty-one) days.   Yes [provider]  cefTRIAXone (ROCEPHIN) IVPB Inject 2 g into the vein daily. Indication:  Prosthetic Joint Infection Last Day of Therapy:  01/13/17 Labs - Once weekly:  CBC/D and BMP, Labs - Every other week:  ESR and CRP 12/06/16 01/13/17 Yes Babish, Rodman Key, PA-C  Cholecalciferol (VITAMIN D3) 2000 UNITS TABS Take 1 tablet by mouth 4 (four) times a week.    Yes [provider]  darifenacin (ENABLEX) 15 MG 24 hr tablet Take 15 mg by mouth daily.   Yes [provider]  docusate sodium (COLACE) 100 MG capsule Take 1 capsule (100 mg total) by mouth 2 (two) times daily as needed for mild constipation. Patient taking differently: Take 100 mg by mouth 2 (two) times daily.  03/20/16  Yes Susa Day, MD  ferrous sulfate 325 (65 FE) MG tablet Take 1 tablet (325 mg total) by mouth 3 (three) times daily after meals. 12/05/16  Yes Babish, Rodman Key, PA-C  gabapentin (NEURONTIN) 300 MG capsule TAKE 1 CAPSULE BY MOUTH 3 TIMES DAILY 02/11/16  Yes Midge Minium, MD  methocarbamol (ROBAXIN) 500 MG tablet Take 1 tablet (500 mg total) by mouth every 6 (six) hours as needed for muscle spasms. 12/05/16  Yes Babish, Rodman Key, PA-C  metroNIDAZOLE (FLAGYL) 500 MG tablet Take 500 mg by mouth 3 (three) times daily. 11/17/16  Yes [provider]  ondansetron (ZOFRAN) 8 MG tablet Take 1 tablet (8 mg total) by mouth every 8 (eight) hours as needed for nausea. 05/23/16  Yes Midge Minium, MD  oxyCODONE (OXY IR/ROXICODONE) 5 MG immediate release tablet Take 1-3 tablets (5-15 mg total) by mouth every 4 (four)  hours as needed for severe pain. 12/05/16  Yes Babish, Rodman Key, PA-C  polyethylene glycol (MIRALAX / GLYCOLAX) packet Take 17 g by mouth 2 (two) times daily. 12/05/16  Yes Babish,  Rodman Key, PA-C  vancomycin IVPB Inject 1,000 mg into the vein every 12 (twelve) hours. Indication:  Prosthetic Joint Infection Last Day of Therapy:  01/13/17 Labs - Sunday/Monday:  CBC/D, BMP, and vancomycin trough. Labs - Thursday:  BMP and vancomycin trough Labs - Every other week:  ESR and CRP 12/05/16 01/13/17 Yes Babish, Rodman Key, PA-C  zolpidem (AMBIEN) 5 MG tablet TAKE 1 TABLET BY MOUTH ONCE DAILY AT BEDTIME AS NEEDED FOR SLEEP 10/02/16  Yes Midge Minium, MD  celecoxib (CELEBREX) 200 MG capsule Take 1 capsule (200 mg total) by mouth 2 (two) times daily. Patient taking differently: Take 200 mg by mouth daily as needed.  10/10/16   Midge Minium, MD  enoxaparin (LOVENOX) 80 MG/0.8ML injection Inject 70 mg into the skin daily.    [provider]     Physical Exam: Vitals:   12/12/16 1232 12/12/16 1330 12/12/16 1430 12/12/16 1500  BP: 126/78 131/72 132/64 125/60  Pulse: (!) 116 (!) 105 98 94  Resp: 16 18 20 18   SpO2: 95% 94% 93% 95%  Weight: 70.3 kg (155 lb)     Height: 5' 7"  (1.702 m)           Vitals:   12/12/16 1232 12/12/16 1330 12/12/16 1430 12/12/16 1500  BP: 126/78 131/72 132/64 125/60  Pulse: (!) 116 (!) 105 98 94  Resp: 16 18 20 18   SpO2: 95% 94% 93% 95%  Weight: 70.3 kg (155 lb)     Height: 5' 7"  (1.702 m)      Constitutional: NAD, calm, comfortable Eyes: PERRL, lids and conjunctivae normal ENMT: Mucous membranes are moist. Posterior pharynx clear of any exudate or lesions.Normal dentition.  Neck: normal, supple, no masses, no thyromegaly Respiratory: clear to auscultation bilaterally, no wheezing, no crackles. Normal respiratory effort. No accessory muscle use.  CardiovasculaTachycardic no murmurs / rubs / gallops. No extremity edema. 2+ pedal pulses. No carotid bruits.   Abdomen: no tenderness, no masses palpated. No hepatosplenomegaly. Bowel sounds positive.  Musculoskeletal: right lower extremity in Ace wrap, painful range of motion  Skin: no rashes, lesions, ulcers. No induration Neurologic: CN 2-12 grossly intact. Sensation intact, DTR normal. Strength 5/5 in all 4.  Psychiatric: Normal judgment and insight. Alert and oriented x 3. Normal mood.     Labs on Admission: I have personally reviewed following labs and imaging studies  CBC:  Recent Labs Lab 12/12/16 1325  WBC 6.2  NEUTROABS 4.7  HGB 6.1*  HCT 19.2*  MCV 96.0  PLT 017    Basic Metabolic Panel:  Recent Labs Lab 12/12/16 1325  NA 135  K 3.6  CL 99*  CO2 28  GLUCOSE 99  BUN 13  CREATININE 0.62  CALCIUM 8.5*    GFR: Estimated Creatinine Clearance: 72.7 mL/min (by C-G formula based on SCr of 0.62 mg/dL).  Liver Function Tests:  Recent Labs Lab 12/12/16 1325  AST 46*  ALT 24  ALKPHOS 121  BILITOT 1.7*  PROT 6.7  ALBUMIN 2.8*   No results for input(s): LIPASE, AMYLASE in the last 168 hours. No results for input(s): AMMONIA in the last 168 hours.  Coagulation Profile:  Recent Labs Lab 12/12/16 1325  INR 1.08   No results for input(s): DDIMER in the last 72 hours.  Cardiac Enzymes: No results for input(s): CKTOTAL, CKMB, CKMBINDEX, TROPONINI in the last 168 hours.  BNP (last 3 results) No results for input(s): PROBNP in the last 8760 hours.  HbA1C: No results for input(s): HGBA1C in  the last 72 hours. No results found for: HGBA1C   CBG: No results for input(s): GLUCAP in the last 168 hours.  Lipid Profile: No results for input(s): CHOL, HDL, LDLCALC, TRIG, CHOLHDL, LDLDIRECT in the last 72 hours.  Thyroid Function Tests: No results for input(s): TSH, T4TOTAL, FREET4, T3FREE, THYROIDAB in the last 72 hours.  Anemia Panel: No results for input(s): VITAMINB12, FOLATE, FERRITIN, TIBC, IRON, RETICCTPCT in the last 72 hours.  Urine analysis:     Component Value Date/Time   COLORURINE YELLOW 05/10/2016 2056   APPEARANCEUR HAZY (A) 05/10/2016 2056   LABSPEC 1.008 05/10/2016 2056   LABSPEC 1.005 02/17/2008 1122   PHURINE 5.0 05/10/2016 2056   GLUCOSEU NEGATIVE 05/10/2016 2056   HGBUR NEGATIVE 05/10/2016 2056   HGBUR trace-intact 11/22/2010 0835   BILIRUBINUR NEGATIVE 05/10/2016 2056   BILIRUBINUR negative 02/25/2012 0859   BILIRUBINUR n 09/19/2011 1505   BILIRUBINUR Negative 02/17/2008 [place] 05/10/2016 2056   PROTEINUR NEGATIVE 05/10/2016 2056   UROBILINOGEN 1.0 02/01/2015 1654   NITRITE NEGATIVE 05/10/2016 2056   LEUKOCYTESUR MODERATE (A) 05/10/2016 2056   LEUKOCYTESUR Moderate 02/17/2008 1122    Sepsis Labs: @LABRCNTIP (procalcitonin:4,lacticidven:4) )No results found for this or any previous visit (from the past 240 hour(s)).       Radiological Exams on Admission: No results found. No results found.    EKG: Independently reviewed. Sinus tachycardia   Assessment and plan  Principal Problem:   Acute blood loss anemia Combination of acute blood loss anemia secondary to right knee hematoma, recent chemotherapy, anemia of chronic disease We will transfuse 2 units of packed red blood cells Continue Lovenox-unfortunately in the setting of breast cancer patient is a high-risk for DVT, will request pharmacy to monitor factor X a levels as appropriate Patient is anticipated to go to the OR for evacuation of hematoma per Dr. Alvan Dame on Tuesday of next week  Continue antibiotics per infectious disease recommendations from 7/13  Fever Check UA, chest x-ray-denies any focal symptoms Continue vancomycin/Rocephin as recommended by infectious disease Once culture results available, infectious disease would need to be consulted and involved, due to complicated antibiotic use in the last 1 year I& D of right knee next week, follow wound culture results      Metastatic breast cancer (HCC)-followed at [facility],  last chemotherapy on 7/3      History of DVT associated with Port-A-Cath-continue Lovenox as above     DVT prophylaxis:  lovenox      Code Status Orders full code            consults called:Dr Alvan Dame  Family Communication: Admission, patients condition and plan of care including tests being ordered have been discussed with the patient ,husband ,daughter  who indicates understanding and agree with the plan and Code Status  Admission status: inpatient   Disposition plan: Further plan will depend as patient's clinical course evolves and further radiologic and laboratory data become available. Likely home when stable   At the time of admission, it appears that the appropriate admission status for this patient is INPATIENT .Thisis judged to be reasonable and necessary in order to provide the required intensity of service to ensure the patient's safetygiven thepresenting symptoms, physical exam findings, and initial radiographic and laboratory data in the context of their chronic comorbidities.   Reyne Dumas MD [facility] Pager [phone]  If 7PM-7AM, please contact night-coverage [email or web address] Password [email or web address]  12/12/2016, 3:31 PM

## 2016-12-12 NOTE — Telephone Encounter (Signed)
Hemoglobin has dropped even more- indicating there is likely active bleeding or a hematoma somewhere.  I am not at all comfortable with a hemoglobin of 5.4 and she needs to go to the ER ASAP

## 2016-12-12 NOTE — ED Triage Notes (Signed)
Pt is a breast CA pt that had knee surgery 10 days ago that became infected. Labs today show that her hemoglobin is 5.4. Sent in by Dr. Alvan Dame for admit. Alert and oriented.

## 2016-12-12 NOTE — Telephone Encounter (Signed)
Called and spoke with pt, she is going to the ER now (pt advised that she has a ride) she is going to [facility] since she is suppose to be having another surgery on Tuesday.

## 2016-12-12 NOTE — Progress Notes (Signed)
ANTICOAGULATION CONSULT NOTE - Initial Consult  Pharmacy Consult for Lovenox - on prior to admission Indication: DVT  Allergies  Allergen Reactions  . Penicillins Hives, Itching and Rash    Has patient had a PCN reaction causing immediate rash, facial/tongue/throat swelling, SOB or lightheadedness with hypotension: no Has patient had a PCN reaction causing severe rash involving mucus membranes or skin necrosis: No Has patient had a PCN reaction that required hospitalization No Has patient had a PCN reaction occurring within the last 10 years: yes If all of the above answers are "NO", then may proceed with Cephalosporin use.   Denies airway involvement   . Other Rash    STERI STRIPS - Blisters  . Tape Hives    Can tolerate paper tape    Patient Measurements: Height: '5\' 7"'$  (170.2 cm) Weight: 155 lb (70.3 kg) IBW/kg (Calculated) : 61.6  Vital Signs: Temp: 98.8 F (37.1 C) (07/20 1540) Temp Source: Oral (07/20 1540) BP: 118/71 (07/20 1540) Pulse Rate: 100 (07/20 1540)  Labs:  Recent Labs  12/12/16 1325  HGB 6.1*  HCT 19.2*  PLT 231  LABPROT 14.0  INR 1.08  CREATININE 0.62    Estimated Creatinine Clearance: 72.7 mL/min (by C-G formula based on SCr of 0.62 mg/dL).   Medical History: Past Medical History:  Diagnosis Date  . 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 Allergic reaction 07/24/2016  . Arthritis KNEES   right knee, hx. past left knee replacemnt.  . Blood clot in vein    around Portacath right chest-tx. Eliquis about a yr., prior Lovenox.  . Blood dyscrasia   . Chronic anticoagulation HX  PORT-A-CATH CLOT   2010 - HAS BEEN ON LOVENOX SINCE THE CLOT  . Drug rash 07/24/2016  . Elevated blood pressure reading without diagnosis of hypertension    PT MONITORS HER B/P AT HOME  . Heart murmur   . 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 PONV (postoperative nausea and vomiting)    ponv likes scopolamine patch  . Skin cancer of anterior chest BREAST CANCER PRIMARY W/ METS TO CHEST WALL SKIN CANCER--  CHEMOTHERAPY EVERY 3 WEEKS AT [facility]   left 9 yrs ago, chemo every 3 weeks at [place], last chemo july 3rd  . Status post skin flap graft    right chest wall with metastatis right anterior chest -no drainage or open wound.    Medications:  Scheduled:   Infusions:  . vancomycin      Assessment: 61 yo female with metastatic breast cancer on Kadcyla last dose on 7/3 to be admitted for anemia was recently discharged from [facility] on 7/13 s/p right infected TKA with removal and abx spacer placement discharged on vanc/Rocephin but now only on vancomycin. Patient also on Lovenox '70mg'$  daily prior to admission for hx DVT. Per [facility] notes, apixaban was stopped and Lovenox '70mg'$  daily restarted per patient preference. Rx by hematologist with dosing per LMWH anti-Xa levels per [facility] heme Md so patient is not on typical '1mg'$ /kg SQ q12 at this time. Baseline Hgb 6.1, Plts 231k  Goal of Therapy:  Heparin level 0.6-1 units/ml Monitor platelets by anticoagulation protocol: Yes   Plan:  1) Continue Lovenox '70mg'$  SQ daily per PTA med list - last dose 7/18 at 0900.  2) Will need to order antiXa level since cannot find  where one has been drawn in quite some time but with patient missing Lovenox dose yesterday 7/19 will have to wait till Lovenox at steady state again  3) Daily SCr and CBC   Adrian Saran, PharmD, BCPS Pager [phone] 12/12/2016 4:06 PM

## 2016-12-12 NOTE — ED Notes (Signed)
Estill Bamberg, RN will call back for report.

## 2016-12-12 NOTE — Progress Notes (Signed)
Pharmacy Antibiotic Note  [name] is a 61 y.o. female admitted on 12/12/2016 with infected right TKA, s/p excisional R TKA with abx spacers on 7/10. Pharmacy has been consulted for vancomycin dosing. Patient recently discharged on 7/13 with plan to continue Vancomycin and Rocephin until 01/13/17 per ID recommendations. Also note that patient has been following with ID clinic for some time and has been a number of antibiotics. Per PTA med list however, patient was instructed to stop Rocephin and is to continue vancomycin. Patient is on 1g IV q12 with last dose being this AM 7/20 at 0900.  Plan: 1) Continue vancomycin 1g IV q12. Next dose due at 2100 2) Recommend ID consult 3) Will check trough tomorrow AM since unsure when last trough was drawn prior to admission  Height: 5\' 7"  (170.2 cm) Weight: 155 lb (70.3 kg) IBW/kg (Calculated) : 61.6  Temp (24hrs), Avg:98.8 F (37.1 C), Min:98.8 F (37.1 C), Max:98.8 F (37.1 C)   Recent Labs Lab 12/12/16 1325  WBC 6.2  CREATININE 0.62    Estimated Creatinine Clearance: 72.7 mL/min (by C-G formula based on SCr of 0.62 mg/dL).    Allergies  Allergen Reactions  . Penicillins Hives, Itching and Rash    Has patient had a PCN reaction causing immediate rash, facial/tongue/throat swelling, SOB or lightheadedness with hypotension: no Has patient had a PCN reaction causing severe rash involving mucus membranes or skin necrosis: No Has patient had a PCN reaction that required hospitalization No Has patient had a PCN reaction occurring within the last 10 years: yes If all of the above answers are "NO", then may proceed with Cephalosporin use.   Denies airway involvement   . Other Rash    STERI STRIPS - Blisters  . Tape Hives    Can tolerate paper tape    Thank you for allowing pharmacy to be a part of this patient's care.   Adrian Saran, PharmD, BCPS Pager [phone] 12/12/2016 3:46 PM

## 2016-12-12 NOTE — Consult Note (Addendum)
[facility]  Date of Admission:  12/12/2016  Date of Consult:  12/12/2016  Reason for Consult: Prosthetic Joint Infection Referring Physician: Alvan Dame  Impression/Recommendation Prosthetic Joint Infection  Enterococcus faecalis  Breast CA with mets to chest wall On chemotherapy at [facility] (last 7-3).   Anemia  Hx of DVT lovenox on hold since 7-19.   Would Resume her vanco and ceftriaxone Watch her H/H Consider LDH, haptoglobin, retic count.  For possible I & D on Tuesday depending on her h/h.  Query if her blood loss is solely from bleeding into her knee.  vanco seems less likely as a cause of her anemia. Cephalosporins can case anemia but this is not listed under ceftriaxone adverse events.   Thank you so much for this interesting consult,   Bobby Rumpf (pager) [phone] [email or web address]  [name] is an 61 y.o. female.  HPI: 61 yo F with hx of breast CA 2009, and R TKR underwent 02-2016.  Her course was complicated by fall and wound dehisc requiring I & D and wound closure on 03-2016.  By 41-0301 she had persisent wound d/c and was adm and treated in hospital with vanco/aztreonam.  She had low grade temp and persistent wound d/c and was re-adm on 05-2016. She had I & D, poly-exchange, and VAC placement. Her Cx grew E faecalis. She was treated with Vanco for 46 days, completed 07-15-16. She was attempted to be transitioned to amoxil after a Pen sensitization test. She developed a rash on this and was then changed to po zyvox which she took for 6 weeks. On 11-06-16 she developed a hematoma (previously on eliquis) on her knee after bumping it. She underwent I & D and her Cx grew peptostreptococcus. She was given flagyl for this for 10 days.     She returned to the hospital on 7-9 and underwent resection of her TKR with placement of an antibiotic spacer. Her Cx again grew E faecalis (pan-sens). She was started on ceftriaxone and  vanco and d/c home on 7-13, to complete anbx on 8-21.   For some reason, her home health orders appear to have only been giving her vancomycin alone.  On her home health labs on 7-19 she was found to have H/H    6.2/19.2. She was adm today with complaints of fever (up to 100.8) and worsening swelling of her R knee. She states she had a blood blister as well as a hematoma of her knee.    She was transfused 2u PRBC.  Has been afebrile in hospital.   Past Medical History:  Diagnosis Date  . 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 Allergic reaction 07/24/2016  . Arthritis KNEES   right knee, hx. past left knee replacemnt.  . Blood clot in vein    around Portacath right chest-tx. Eliquis about a yr., prior Lovenox.  . Blood dyscrasia   . Chronic anticoagulation HX  PORT-A-CATH CLOT   2010 - HAS BEEN ON LOVENOX SINCE THE CLOT  . Drug rash 07/24/2016  . Elevated blood pressure reading without diagnosis of hypertension    PT MONITORS HER B/P AT HOME  . Heart murmur   . 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 PONV (postoperative nausea and vomiting)    ponv likes scopolamine patch  . Skin cancer  of anterior chest BREAST CANCER PRIMARY W/ METS TO CHEST WALL SKIN CANCER--  CHEMOTHERAPY EVERY 3 WEEKS AT [facility]   left 9 yrs ago, chemo every 3 weeks at [place], last chemo july 3rd  . Status post skin flap graft    right chest wall with metastatis right anterior chest -no drainage or open wound.    Past Surgical History:  Procedure Laterality Date  . APPLICATION OF A-CELL OF BACK Right 07/31/2016   Procedure: CELLERATE COLLAGEN PLACEMENT;  Surgeon: Loel Lofty Dillingham, DO;  Location: [facility];  Service: Plastics;  Laterality: Right;  . CHEST WALL TUMOR EXCISION     right  . EXCISIONAL TOTAL KNEE ARTHROPLASTY WITH ANTIBIOTIC SPACERS Right 12/02/2016   Procedure: EXCISIONAL  TOTAL KNEE ARTHROPLASTY WITH ANTIBIOTIC SPACERS;  Surgeon: Paralee Cancel, MD;  Location: [facility];  Service: Orthopedics;  Laterality: Right;  90 mins  . hemotoma     evacuation left chest wall  . I&D KNEE WITH POLY EXCHANGE Right 05/28/2016   Procedure: IRRIGATION AND DEBRIDEMENT KNEE WOUND VAC PLACMENT;  Surgeon: Susa Day, MD;  Location: [facility];  Service: Orthopedics;  Laterality: Right;  . I&D KNEE WITH POLY EXCHANGE Right 05/30/2016   Procedure: RADICAL SYNOVECTOMY,IRRIGATION AND DEBRIDEMENT KNEE WITH POLY EXCHANGE WITH ANTIBIOTIC BEADS, APPLICATION OF WOUND VAC;  Surgeon: Susa Day, MD;  Location: [facility];  Service: Orthopedics;  Laterality: Right;  . INCISION AND DRAINAGE OF WOUND Right 07/31/2016   Procedure: IRRIGATION AND DEBRIDEMENT RIGHT KNEE  WOUND;  Surgeon: Loel Lofty Dillingham, DO;  Location: [facility];  Service: Plastics;  Laterality: Right;  . IRRIGATION AND DEBRIDEMENT KNEE Right 04/12/2016   Procedure: IRRIGATION AND DEBRIDEMENT KNEE;  Surgeon: Nicholes Stairs, MD;  Location: [facility];  Service: Orthopedics;  Laterality: Right;  . KNEE ARTHROSCOPY  02/13/2012   Procedure: ARTHROSCOPY KNEE;  Surgeon: Johnn Hai, MD;  Location: [facility];  Service: Orthopedics;  Laterality: Left;  WITH DEBRIDEMENt   . KNEE ARTHROSCOPY WITH LATERAL MENISECTOMY  02/13/2012   Procedure: KNEE ARTHROSCOPY WITH LATERAL MENISECTOMY;  Surgeon: Johnn Hai, MD;  Location: [facility];  Service: Orthopedics;;  partial  . LEFT MODIFIED RADICAL MASTECTOMY/ RIGHT TOTAL MASTECTOMY  11-13-2007   LEFT BREAST CANCER W/ AXILLARY LYMPH NODE METASTASIS AND POST NEOADJUVANT CHEMO  . PLACEMENT PORT-A-CATH  06/22/2007   right chest new port placed 2015, right chest  . SKIN GRAFT     chest-post tumor removal, second occurrence of cancer" 2015 surgery for Flap 2015"  . TOTAL KNEE ARTHROPLASTY Left 09/23/2012   Procedure: LEFT TOTAL KNEE ARTHROPLASTY;  Surgeon: Johnn Hai, MD;   Location[facility];  Service: Orthopedics;  Laterality: Left;  . TOTAL KNEE ARTHROPLASTY Right 03/20/2016   Procedure: RIGHT TOTAL KNEE ARTHROPLASTY;  Surgeon: Susa Day, MD;  Location[facility];  Service: Orthopedics;  Laterality: Right;  . TRANSTHORACIC ECHOCARDIOGRAM  11-18-2011  DR BENSIMHON (ECHO EVERY 3 MONTHS)   HX CHEMO INDUCED CARDIOTOXICITY/  LVSF NORMAL/ EF 55-50%/ MILD MITRIAL VALVE REGURG./ MILDLY INCREASED SYSTOLIC PRESSURE OF PULMONARY ARTERIES  . VASCULAR SURGERY Right    portacath chest     Allergies  Allergen Reactions  . Penicillins Hives, Itching and Rash    Has patient had a PCN reaction causing immediate rash, facial/tongue/throat swelling, SOB or lightheadedness with hypotension: no Has patient had a PCN reaction causing severe rash involving mucus membranes or skin necrosis: No Has patient had a PCN reaction that required hospitalization No Has  patient had a PCN reaction occurring within the last 10 years: yes If all of the above answers are "NO", then may proceed with Cephalosporin use.   Denies airway involvement   . Other Rash    STERI STRIPS - Blisters  . Tape Hives    Can tolerate paper tape    Medications:  Scheduled: . [START ON 12/13/2016] cholecalciferol  2,000 Units Oral Once per day on Sun Tue Thu Sat  . [START ON 12/13/2016] darifenacin  15 mg Oral Daily  . enoxaparin (LOVENOX) injection  70 mg Subcutaneous Q24H  . ferrous sulfate  325 mg Oral TID PC  . gabapentin  300 mg Oral TID  . polyethylene glycol  17 g Oral BID    Abtx:  Anti-infectives    Start     Dose/Rate Route Frequency Ordered Stop   12/12/16 2100  vancomycin (VANCOCIN) IVPB 1000 mg/200 mL premix     1,000 mg 200 mL/hr over 60 Minutes Intravenous Every 12 hours 12/12/16 1551     12/12/16 1715  cefTRIAXone (ROCEPHIN) IVPB  Status:  Discontinued    Comments:  Indication:  Prosthetic Joint Infection Last Day of Therapy:  01/13/17 Labs - Once weekly:  CBC/D and BMP, Labs - Every  other week:  ESR and CRP     2 g Intravenous Every 24 hours 12/12/16 1705 12/12/16 1710   12/12/16 1715  vancomycin IVPB  Status:  Discontinued    Comments:  Indication:  Prosthetic Joint Infection Last Day of Therapy:  01/13/17 Labs - Sunday/Monday:  CBC/D, BMP, and vancomycin trough. Labs - Thursday:  BMP and vancomycin trough Labs - Every other week:  ESR and CRP     1,000 mg Intravenous Every 12 hours 12/12/16 1705 12/12/16 1708      Total days of antibiotics: vanco (7-9), ceftriaxone (7-19)          Social History:  reports that she has never smoked. She has never used smokeless tobacco. She reports that she does not drink alcohol or use drugs.  Family History  Problem Relation Age of Onset  . Heart disease Mother        due to mitral valve regurgiation,   . Cancer Mother        breast  . Allergic rhinitis Mother   . Parkinsonism Father   . Allergic rhinitis Father   . Migraines Father   . Alcohol abuse Paternal Grandfather   . Angioedema Neg Hx   . Asthma Neg Hx   . Eczema Neg Hx     History obtained from chart review and the patient General ROS: no dysphagia, normal BM, no melena, no BRBPR, no dysuria. did have lightheadedness this AM No problems with port.  Please see HPI. 12 point ROS o/w (-)  Blood pressure (!) 136/59, pulse (!) 113, temperature 98.7 F (37.1 C), temperature source Oral, resp. rate 18, height _0  (1.702 m), weight 70.3 kg (155 lb), last menstrual period 06/27/2007, SpO2 97 %. General appearance: alert, cooperative, no distress and pale Eyes: negative findings: pupils equal, round, reactive to light and accomodation Throat: normal findings: oropharynx pink & moist without lesions or evidence of thrush Neck: no adenopathy and supple, symmetrical, trachea midline Lungs: clear to auscultation bilaterally Heart: systolic murmur: early systolic 4/6, crescendo at 2nd left intercostal space, at 2nd right intercostal space and tachycardia Abdomen:  normal findings: bowel sounds normal and soft, non-tender Extremities: edema RLE grossly edematous.  and RUE port is non-tender, non-fluctuant.  wound with active bleeding, dehisc centrally, firmness. non-tender.  See image under media tab.   Results for orders placed or performed during the hospital encounter of 12/12/16 (from the past 48 hour(s))  CBC with Differential     Status: Abnormal   Collection Time: 12/12/16  1:25 PM  Result Value Ref Range   WBC 6.2 4.0 - 10.5 K/uL   RBC 2.00 (L) 3.87 - 5.11 MIL/uL   Hemoglobin 6.1 (LL) 12.0 - 15.0 g/dL    Comment: REPEATED TO VERIFY CRITICAL RESULT CALLED TO, READ BACK BY AND VERIFIED WITH: M.HAMBY RN AT 1357 ON 12/12/16 BY S.VANHOORNE MLS    HCT 19.2 (L) 36.0 - 46.0 %   MCV 96.0 78.0 - 100.0 fL   MCH 30.5 26.0 - 34.0 pg   MCHC 31.8 30.0 - 36.0 g/dL   RDW 23.2 (H) 11.5 - 15.5 %   Platelets 231 150 - 400 K/uL   Neutrophils Relative % 76 %   Neutro Abs 4.7 1.7 - 7.7 K/uL   Lymphocytes Relative 16 %   Lymphs Abs 1.0 0.7 - 4.0 K/uL   Monocytes Relative 7 %   Monocytes Absolute 0.4 0.1 - 1.0 K/uL   Eosinophils Relative 1 %   Eosinophils Absolute 0.1 0.0 - 0.7 K/uL   Basophils Relative 1 %   Basophils Absolute 0.0 0.0 - 0.1 K/uL  Comprehensive metabolic panel     Status: Abnormal   Collection Time: 12/12/16  1:25 PM  Result Value Ref Range   Sodium 135 135 - 145 mmol/L   Potassium 3.6 3.5 - 5.1 mmol/L   Chloride 99 (L) 101 - 111 mmol/L   CO2 28 22 - 32 mmol/L   Glucose, Bld 99 65 - 99 mg/dL   BUN 13 6 - 20 mg/dL   Creatinine, Ser 0.62 0.44 - 1.00 mg/dL   Calcium 8.5 (L) 8.9 - 10.3 mg/dL   Total Protein 6.7 6.5 - 8.1 g/dL   Albumin 2.8 (L) 3.5 - 5.0 g/dL   AST 46 (H) 15 - 41 U/L   ALT 24 14 - 54 U/L   Alkaline Phosphatase 121 38 - 126 U/L   Total Bilirubin 1.7 (H) 0.3 - 1.2 mg/dL   GFR calc non Af Amer >60 >60 mL/min   GFR calc Af Amer >60 >60 mL/min    Comment: (NOTE) The eGFR has been calculated using the CKD EPI  equation. This calculation has not been validated in all clinical situations. eGFR's persistently <60 mL/min signify possible Chronic Kidney Disease.    Anion gap 8 5 - 15  Protime-INR     Status: None   Collection Time: 12/12/16  1:25 PM  Result Value Ref Range   Prothrombin Time 14.0 11.4 - 15.2 seconds   INR 1.08   Magnesium     Status: None   Collection Time: 12/12/16  1:25 PM  Result Value Ref Range   Magnesium 1.8 1.7 - 2.4 mg/dL  Type and screen [place]     Status: None (Preliminary result)   Collection Time: 12/12/16  1:30 PM  Result Value Ref Range   ABO/RH(D) A POS    Antibody Screen NEG    Sample Expiration 12/15/2016    Unit Number [id number]    Blood Component Type RED CELLS,LR    Unit division 00    Status of Unit ISSUED    Transfusion Status OK TO TRANSFUSE    Crossmatch Result Compatible    Unit Number [id number]  Blood Component Type RED CELLS,LR    Unit division 00    Status of Unit ALLOCATED    Transfusion Status OK TO TRANSFUSE    Crossmatch Result Compatible    Unit Number [id number]    Blood Component Type RED CELLS,LR    Unit division 00    Status of Unit ALLOCATED    Transfusion Status OK TO TRANSFUSE    Crossmatch Result Compatible   Prepare RBC     Status: None   Collection Time: 12/12/16  1:30 PM  Result Value Ref Range   Order Confirmation ORDER PROCESSED BY BLOOD BANK   Urinalysis, Routine w reflex microscopic     Status: None   Collection Time: 12/12/16  3:33 PM  Result Value Ref Range   Color, Urine YELLOW YELLOW   APPearance CLEAR CLEAR   Specific Gravity, Urine 1.010 1.005 - 1.030   pH 7.0 5.0 - 8.0   Glucose, UA NEGATIVE NEGATIVE mg/dL   Hgb urine dipstick NEGATIVE NEGATIVE   Bilirubin Urine NEGATIVE NEGATIVE   Ketones, ur NEGATIVE NEGATIVE mg/dL   Protein, ur NEGATIVE NEGATIVE mg/dL   Nitrite NEGATIVE NEGATIVE   Leukocytes, UA NEGATIVE NEGATIVE      Component Value Date/Time   SDES KNEE  RIGHT KNEE JOINT 12/02/2016 1338   SDES SYNOVIAL RIGHT KNEE 12/02/2016 1338   SPECREQUEST NONE 12/02/2016 1338   SPECREQUEST NONE 12/02/2016 1338   CULT  12/02/2016 1338    RARE ENTEROCOCCUS FAECALIS NO ANAEROBES ISOLATED CRITICAL RESULT CALLED TO, READ BACK BY AND VERIFIED WITH: DR. Linus Salmons AT 1422 12/05/16 BY D. VANHOOK REGARDING CULTURE GROWTH Performed at [facility], [place] [address], [place], [place] [address]    CULT RARE ENTEROCOCCUS FAECALIS 12/02/2016 1338   REPTSTATUS 12/06/2016 FINAL 12/02/2016 1338   REPTSTATUS 12/08/2016 FINAL 12/02/2016 1338   Dg Chest Port 1 View  Result Date: 12/12/2016 CLINICAL DATA:  61 year old female with history of fever for the past 2 days. Fatigue and weakness. History breast cancer. EXAM: PORTABLE CHEST 1 VIEW COMPARISON:  Chest x-ray 05/10/2016. FINDINGS: Lung volumes are slightly low. No acute consolidative airspace disease. No pleural effusions. No evidence of pulmonary edema. However, the central pulmonary arteries are very prominent. Heart size is mildly enlarged. The patient is rotated to the right on today's exam, resulting in distortion of the mediastinal contours and reduced diagnostic sensitivity and specificity for mediastinal pathology. Right internal jugular single-lumen porta cath with tip terminating at the superior cavoatrial junction. Surgical clips noted throughout the chest wall and in the axillary regions bilaterally. IMPRESSION: 1. No definite radiographic evidence of acute cardiopulmonary disease. 2. Cardiomegaly. 3. Dilatation of the proximal pulmonary arteries, concerning for pulmonary arterial hypertension. Electronically Signed   By: Vinnie Langton M.D.   On: 12/12/2016 16:16   No results found for this or any previous visit (from the past 240 hour(s)).    12/12/2016, 5:35 PM     LOS: 0 days    Records and images were personally reviewed where available.  Bobby Rumpf, MD [facility]  [facility] [phone] 12/12/2016, 5:35 PM

## 2016-12-12 NOTE — Progress Notes (Signed)
[facility]  Active pt with [facility] for home IV ABX.  [facility] hospital team will follow pt until DC to support transition home.  If patient discharges after hours, please call [phone].   [name] 12/12/2016, 2:03 PM

## 2016-12-13 LAB — COMPREHENSIVE METABOLIC PANEL
ALT: 23 U/L (ref 14–54)
ANION GAP: 10 (ref 5–15)
AST: 46 U/L — ABNORMAL HIGH (ref 15–41)
Albumin: 2.7 g/dL — ABNORMAL LOW (ref 3.5–5.0)
Alkaline Phosphatase: 127 U/L — ABNORMAL HIGH (ref 38–126)
BUN: 8 mg/dL (ref 6–20)
CHLORIDE: 97 mmol/L — AB (ref 101–111)
CO2: 27 mmol/L (ref 22–32)
Calcium: 8.3 mg/dL — ABNORMAL LOW (ref 8.9–10.3)
Creatinine, Ser: 0.63 mg/dL (ref 0.44–1.00)
Glucose, Bld: 101 mg/dL — ABNORMAL HIGH (ref 65–99)
POTASSIUM: 3.5 mmol/L (ref 3.5–5.1)
SODIUM: 134 mmol/L — AB (ref 135–145)
Total Bilirubin: 2.2 mg/dL — ABNORMAL HIGH (ref 0.3–1.2)
Total Protein: 6.7 g/dL (ref 6.5–8.1)

## 2016-12-13 LAB — LACTATE DEHYDROGENASE: LDH: 381 U/L — AB (ref 98–192)

## 2016-12-13 LAB — BILIRUBIN, FRACTIONATED(TOT/DIR/INDIR)
BILIRUBIN DIRECT: 0.2 mg/dL (ref 0.1–0.5)
BILIRUBIN TOTAL: 1.6 mg/dL — AB (ref 0.3–1.2)
Indirect Bilirubin: 1.4 mg/dL — ABNORMAL HIGH (ref 0.3–0.9)

## 2016-12-13 LAB — VANCOMYCIN, TROUGH: Vancomycin Tr: 18 ug/mL (ref 15–20)

## 2016-12-13 LAB — HIV ANTIBODY (ROUTINE TESTING W REFLEX): HIV SCREEN 4TH GENERATION: NONREACTIVE

## 2016-12-13 LAB — RETICULOCYTES
RBC.: 2.87 MIL/uL — ABNORMAL LOW (ref 3.87–5.11)
Retic Count, Absolute: 241.1 10*3/uL — ABNORMAL HIGH (ref 19.0–186.0)
Retic Ct Pct: 8.4 % — ABNORMAL HIGH (ref 0.4–3.1)

## 2016-12-13 LAB — HEMOGLOBIN AND HEMATOCRIT, BLOOD
HEMATOCRIT: 26.3 % — AB (ref 36.0–46.0)
HEMOGLOBIN: 8.4 g/dL — AB (ref 12.0–15.0)

## 2016-12-13 MED ORDER — SODIUM CHLORIDE 0.9 % IV SOLN
INTRAVENOUS | Status: DC
  Administered 2016-12-13: 18:00:00 via INTRAVENOUS
  Administered 2016-12-15: 1000 mL via INTRAVENOUS
  Administered 2016-12-16 – 2016-12-18 (×4): via INTRAVENOUS

## 2016-12-13 MED ORDER — HYDROMORPHONE HCL-NACL 0.5-0.9 MG/ML-% IV SOSY
0.5000 mg | PREFILLED_SYRINGE | INTRAVENOUS | Status: DC | PRN
  Administered 2016-12-13 – 2016-12-16 (×3): 0.5 mg via INTRAVENOUS
  Filled 2016-12-13 (×3): qty 1

## 2016-12-13 MED ORDER — ACETAMINOPHEN 325 MG PO TABS
650.0000 mg | ORAL_TABLET | Freq: Four times a day (QID) | ORAL | Status: DC | PRN
  Administered 2016-12-13 – 2016-12-14 (×2): 650 mg via ORAL
  Filled 2016-12-13 (×2): qty 2

## 2016-12-13 MED ORDER — SODIUM CHLORIDE 0.9% FLUSH
10.0000 mL | INTRAVENOUS | Status: DC | PRN
  Administered 2016-12-13 – 2016-12-19 (×3): 10 mL
  Filled 2016-12-13 (×3): qty 40

## 2016-12-13 NOTE — Progress Notes (Signed)
PROGRESS NOTE    [name]  TMA:[id number] DOB: 12-06-1955 DOA: 12/12/2016 PCP: Midge Minium, MD    Brief Narrative:  61 year old female 61 y.o.femalewith a history of breast cancer followed at [facility] by Force, Delice Bison, MD, status post most recent chemotherapy on 11/25/16, prior history of DVT (on eliquis which was switched to Lovenox in June 2018)  , left knee arthroplasty in 2009 and then right TKA 25/63/8937 complicated by a fall and underwent 3  incisions and drainage since  November 2018. She took cephalexin after that but had persistent drainage with cellulitis and treated with a short course of vancomycin + aztreonam and transitioned to cipro for possible UTI but continued drainage and readmitted in January 2018. She underwent I and D and poly-exchange and VAC placed and joint cultures grew Enterococcus faecalis and completed 6 weeks of IV vancomcyin and transitioned to oral amoxicillin after an allergy test for penicillin but developed hives. She then transitioned to oral linezolid and took about 6 weeks but stopped on her own with concern for side effects. She did require surgery on her wound by Dr. Baltazar Apo in March and had done well but developed a hematoma after bumping her knee in June and was debrided and grew an anaerob and was put on metronidazole which she took until 3 weeks ago.  she was readmitted in July and underwent resection of right total knee arthroplasty with placement of antibiotic spacer by Dr. Alvan Dame.   She was discharged on 12/05/16 with IV vancomycin and Rocephin to be completed through 01/13/17 through home health. Patient has only been receiving IV vancomycin since her discharge on 7/13, family and Dr Alvan Dame do not know why . Dr. Alvan Dame has been following her hemoglobin and a hemoglobin drop from 7-5.4 on blood work yesterday. Patient was advised to come to the ED. She was advised to hold Lovenox and she has not taken any in the last 24 hours.  Assessment &  Plan:   Principal Problem:   Acute blood loss anemia Active Problems:   Metastatic breast cancer (HCC)   Anxiety, generalized   Postoperative anemia due to acute blood loss   S/P total knee arthroplasty, right   Right knee abx spacer  1-Acute blood loss anemia; multifactorial  Combination of acute blood loss anemia secondary to right knee hematoma,, anemia of chronic disease Patient gets [facility], which doesn't cause anemia.  S/P 2 units PRBC.  Hb at 8.9.  Monitor on Lovenox.  pharmacy will check level tonight and adjust or decrease dose as needed.  Will check haptoglobin, LDH. retic count.  Will repeat Hb tonight.   History of DVT associated with Port-A-Cath-continue Lovenox as above   Prosthetic Joint infection, hematoma;  Report draining pus at home.  plan for I and D on Tuesday.  Continue with IV vancomycin and ceftriaxone.  Ortho following.        DVT prophylaxis: on lovenox Code Status: full code.  Family Communication: (husband and daughter Disposition Plan: to be determine.  Consultants:   ID  Ortho   Procedures: none   Antimicrobials:   Vancomycin  Ceftriaxone.    Subjective: She denies melena, hematemesis,  Objective: Vitals:   12/12/16 2021 12/12/16 2234 12/13/16 0031 12/13/16 0506  BP: 127/69 131/71  121/69  Pulse: (!) 106 (!) 110  (!) 110  Resp: 18 16  18   Temp: 99.6 F (37.6 C) 100.1 F (37.8 C) 99.6 F (37.6 C) 100.1 F (37.8 C)  TempSrc: Oral Oral  Oral  SpO2: 95% 92%  93%  Weight:      Height:        Intake/Output Summary (Last 24 hours) at 12/13/16 1359 Last data filed at 12/13/16 1301  Gross per 24 hour  Intake             1040 ml  Output                0 ml  Net             1040 ml   Filed Weights   12/12/16 1232  Weight: 70.3 kg (155 lb)    Examination:  General exam: Appears calm and comfortable  Respiratory system: Clear to auscultation. Respiratory effort normal. Cardiovascular system: S1 & S2 heard,  RRR. No JVD, murmurs, rubs, gallops or clicks. No pedal edema. Gastrointestinal system: Abdomen is nondistended, soft and nontender. No organomegaly or masses felt. Normal bowel sounds heard. Central nervous system: Alert and oriented. No focal neurological deficits. Extremities: left 5 x 5 power. Right knee with clean dressing  Psychiatry: Judgement and insight appear normal. Mood & affect appropriate.     Data Reviewed: I have personally reviewed following labs and imaging studies  CBC:  Recent Labs Lab 12/12/16 1325 12/13/16 0549  WBC 6.2 6.5  NEUTROABS 4.7  --   HGB 6.1* 8.9*  HCT 19.2* 27.5*  MCV 96.0 92.0  PLT 231 102   Basic Metabolic Panel:  Recent Labs Lab 12/12/16 1325 12/13/16 0549  NA 135 134*  K 3.6 3.5  CL 99* 97*  CO2 28 27  GLUCOSE 99 101*  BUN 13 8  CREATININE 0.62 0.63  CALCIUM 8.5* 8.3*  MG 1.8  --    GFR: Estimated Creatinine Clearance: 72.7 mL/min (by C-G formula based on SCr of 0.63 mg/dL). Liver Function Tests:  Recent Labs Lab 12/12/16 1325 12/13/16 0549 12/13/16 1300  AST 46* 46*  --   ALT 24 23  --   ALKPHOS 121 127*  --   BILITOT 1.7* 2.2* 1.6*  PROT 6.7 6.7  --   ALBUMIN 2.8* 2.7*  --    No results for input(s): LIPASE, AMYLASE in the last 168 hours. No results for input(s): AMMONIA in the last 168 hours. Coagulation Profile:  Recent Labs Lab 12/12/16 1325  INR 1.08   Cardiac Enzymes: No results for input(s): CKTOTAL, CKMB, CKMBINDEX, TROPONINI in the last 168 hours. BNP (last 3 results) No results for input(s): PROBNP in the last 8760 hours. HbA1C: No results for input(s): HGBA1C in the last 72 hours. CBG: No results for input(s): GLUCAP in the last 168 hours. Lipid Profile: No results for input(s): CHOL, HDL, LDLCALC, TRIG, CHOLHDL, LDLDIRECT in the last 72 hours. Thyroid Function Tests: No results for input(s): TSH, T4TOTAL, FREET4, T3FREE, THYROIDAB in the last 72 hours. Anemia Panel:  Recent Labs   12/13/16 1300  RETICCTPCT 8.4*   Sepsis Labs: No results for input(s): PROCALCITON, LATICACIDVEN in the last 168 hours.  No results found for this or any previous visit (from the past 240 hour(s)).       Radiology Studies: Dg Chest Port 1 View  Result Date: 12/12/2016 CLINICAL DATA:  61 year old female with history of fever for the past 2 days. Fatigue and weakness. History breast cancer. EXAM: PORTABLE CHEST 1 VIEW COMPARISON:  Chest x-ray 05/10/2016. FINDINGS: Lung volumes are slightly low. No acute consolidative airspace disease. No pleural effusions. No evidence of pulmonary edema. However, the central pulmonary arteries are  very prominent. Heart size is mildly enlarged. The patient is rotated to the right on today's exam, resulting in distortion of the mediastinal contours and reduced diagnostic sensitivity and specificity for mediastinal pathology. Right internal jugular single-lumen porta cath with tip terminating at the superior cavoatrial junction. Surgical clips noted throughout the chest wall and in the axillary regions bilaterally. IMPRESSION: 1. No definite radiographic evidence of acute cardiopulmonary disease. 2. Cardiomegaly. 3. Dilatation of the proximal pulmonary arteries, concerning for pulmonary arterial hypertension. Electronically Signed   By: Vinnie Langton M.D.   On: 12/12/2016 16:16        Scheduled Meds: . cholecalciferol  2,000 Units Oral Once per day on Sun Tue Thu Sat  . darifenacin  15 mg Oral Daily  . enoxaparin (LOVENOX) injection  70 mg Subcutaneous Q24H  . ferrous sulfate  325 mg Oral TID PC  . gabapentin  300 mg Oral TID  . polyethylene glycol  17 g Oral BID   Continuous Infusions: . sodium chloride 50 mL/hr at 12/13/16 1244  . cefTRIAXone (ROCEPHIN)  IV Stopped (12/13/16 0046)  . vancomycin 1,000 mg (12/13/16 1117)     LOS: 1 day    Time spent: 49    Orla Jolliff, Cassie Freer, MD [facility] Pager [phone]  If 7PM-7AM, please  contact night-coverage [email or web address] Password [email or web address] 12/13/2016, 1:59 PM

## 2016-12-13 NOTE — Progress Notes (Addendum)
ANTICOAGULATION CONSULT NOTE - Lovenox  Pharmacy consulted for assistance of Lovenox dosing.  Anticoagulation history: 89 yoF has been anticoagulated since 2010 for history of clots surrounding port-a-cath per patient.  This is in the setting of metastatic breast cancer.  Per chart review, notes indicate that CT chest in 12/2008 revealed thrombus in the innominate vein and superior vena cava at the site of a Port-A-Cath.  In 2010, patient was started on warfarin 1 mg prophylaxis treatment and then transitioned to "treatment dose of SQ enoxaparin, 2010."  Duration of therapy is indefinitely, as long as port-a-cath remains in place.  At one point, patient was transitioned to apixaban but had experienced significant bruising so patient requested transition back to Lovenox last month (June 2018).  Patient is on Lovenox 70 mg SQ q24h PTA per patient's hematologist at [facility] who resumed this dose that she was on previously.  Per [facility] hematology notes, target LMWF level is measurable drug at nadir on 24 hour dosing.  Last LMWH nadir level obtained = 0.15 unit/mL on 70 mg q24h dosing in 12/2014.  Current admission: Upon admission, patient's hemoglobin = 6.1 g/dL.  Patient's Hgb had been steadily declining since resection of right TKA with placement of antibiotic spacer on 7/10.  Has improved to 8.9 following 2 units PRBC.  Patient's PTA Lovenox dose of 70 mg q24h was resumed.  Ortho concerned that patient requires a lower Lovenox dose.    Plan: Will check a 4 hour LMWH level tonight following 2nd Lovenox 70 mg dose.  If level above recommended therapeutic level, will reduce dose.  Plan communicated to ortho MD, TRH, and patient.  Hershal Coria, PharmD, BCPS Pager: [phone] 12/13/2016 3:46 PM  ADDENDUM: 12/14/2016 8:12 AM 4 hour LMWH level = 0.42 unit/ml  Therapeutic 4-hour level for 1 mg/kg dose is 0.6-1 unit/ml.  Since patient's level is below 1 unit/ml, do not consider her 1 mg/kg dose (70 mg) toxic.   Note that her level is below the therapeutic window; however, per her hematology office, their goal is measurable drug at nadir on 24 hour dosing for her long term therapy. Her 4-hour level is within the prophylactic target range of 0.3-0.6 unit/ml.  Keeping with her hematologist's plan, will continue with Lovenox 70 mg q24h as they had prescribed.  Plan: Continue Lovenox 70 mg SQ q24h.  Defer to ortho timing of last dose prior to procedure on 7/24. Check CBC at least q72h.  Hershal Coria, PharmD Pager: [phone] 12/14/2016

## 2016-12-13 NOTE — Consult Note (Signed)
Reason for Consult:  Anemia / drainage of wound  Referring Physician: ED Physician  [name] is an 61 y.o. female.  HPI: Patient is well known to our office.  Most recently she had a resection of right total knee arthroplasty with placement of antibiotic spacer by Dr. Alvan Dame.   She was discharged on 12/05/16.   Since that time her HGB has been steadily declining.  She stated that she had a 5.4 hgb and was advised to go to the ER. She was being admitted by the hospitalist to deal with her anemia. At this time we are planning on a repeat I&D with wound closure vs wound vac on 12/16/2016 per Dr. Alvan Dame.  Patient has only been receiving IV vancomycin since her discharge on 7/13, family and Dr Alvan Dame do not know why as it was directed for her to also receive Rocephin.  Will have ID involved to help follow the patient with regards to her antibiotics.    Past Medical History:  Diagnosis Date  . 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 Allergic reaction 07/24/2016  . Arthritis KNEES   right knee, hx. past left knee replacemnt.  . Blood clot in vein    around Portacath right chest-tx. Eliquis about a yr., prior Lovenox.  . Blood dyscrasia   . Chronic anticoagulation HX  PORT-A-CATH CLOT   2010 - HAS BEEN ON LOVENOX SINCE THE CLOT  . Drug rash 07/24/2016  . Elevated blood pressure reading without diagnosis of hypertension    PT MONITORS HER B/P AT HOME  . Heart murmur   . 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 PONV (postoperative nausea and vomiting)    ponv likes scopolamine patch  . 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, last chemo july 3rd  . Status post skin flap graft    right chest wall with metastatis right anterior chest -no  drainage or open wound.    Past Surgical History:  Procedure Laterality Date  . APPLICATION OF A-CELL OF BACK Right 07/31/2016   Procedure: CELLERATE COLLAGEN PLACEMENT;  Surgeon: Loel Lofty Dillingham, DO;  Location: [place];  Service: Plastics;  Laterality: Right;  . CHEST WALL TUMOR EXCISION     right  . EXCISIONAL TOTAL KNEE ARTHROPLASTY WITH ANTIBIOTIC SPACERS Right 12/02/2016   Procedure: EXCISIONAL TOTAL KNEE ARTHROPLASTY WITH ANTIBIOTIC SPACERS;  Surgeon: Paralee Cancel, MD;  Location: [facility];  Service: Orthopedics;  Laterality: Right;  90 mins  . hemotoma     evacuation left chest wall  . I&D KNEE WITH POLY EXCHANGE Right 05/28/2016   Procedure: IRRIGATION AND DEBRIDEMENT KNEE WOUND VAC PLACMENT;  Surgeon: Susa Day, MD;  Location: [facility];  Service: Orthopedics;  Laterality: Right;  . I&D KNEE WITH POLY EXCHANGE Right 05/30/2016   Procedure: RADICAL SYNOVECTOMY,IRRIGATION AND DEBRIDEMENT KNEE WITH POLY EXCHANGE WITH ANTIBIOTIC BEADS, APPLICATION OF WOUND VAC;  Surgeon: Susa Day, MD;  Location: [facility];  Service: Orthopedics;  Laterality: Right;  . INCISION AND DRAINAGE OF WOUND Right 07/31/2016   Procedure: IRRIGATION AND DEBRIDEMENT RIGHT KNEE  WOUND;  Surgeon: Loel Lofty Dillingham, DO;  Location: [place];  Service: Plastics;  Laterality: Right;  .  IRRIGATION AND DEBRIDEMENT KNEE Right 04/12/2016   Procedure: IRRIGATION AND DEBRIDEMENT KNEE;  Surgeon: Nicholes Stairs, MD;  Location: [facility];  Service: Orthopedics;  Laterality: Right;  . KNEE ARTHROSCOPY  02/13/2012   Procedure: ARTHROSCOPY KNEE;  Surgeon: Johnn Hai, MD;  Location: [facility];  Service: Orthopedics;  Laterality: Left;  WITH DEBRIDEMENt   . KNEE ARTHROSCOPY WITH LATERAL MENISECTOMY  02/13/2012   Procedure: KNEE ARTHROSCOPY WITH LATERAL MENISECTOMY;  Surgeon: Johnn Hai, MD;  Location: [place];  Service: Orthopedics;;  partial  . LEFT MODIFIED RADICAL MASTECTOMY/ RIGHT TOTAL  MASTECTOMY  11-13-2007   LEFT BREAST CANCER W/ AXILLARY LYMPH NODE METASTASIS AND POST NEOADJUVANT CHEMO  . PLACEMENT PORT-A-CATH  06/22/2007   right chest new port placed 2015, right chest  . SKIN GRAFT     chest-post tumor removal, second occurrence of cancer" 2015 surgery for Flap 2015"  . TOTAL KNEE ARTHROPLASTY Left 09/23/2012   Procedure: LEFT TOTAL KNEE ARTHROPLASTY;  Surgeon: Johnn Hai, MD;  Location: [facility];  Service: Orthopedics;  Laterality: Left;  . TOTAL KNEE ARTHROPLASTY Right 03/20/2016   Procedure: RIGHT TOTAL KNEE ARTHROPLASTY;  Surgeon: Susa Day, MD;  Location: [facility];  Service: Orthopedics;  Laterality: Right;  . TRANSTHORACIC ECHOCARDIOGRAM  11-18-2011  DR BENSIMHON (ECHO EVERY 3 MONTHS)   HX CHEMO INDUCED CARDIOTOXICITY/  LVSF NORMAL/ EF 55-50%/ MILD MITRIAL VALVE REGURG./ MILDLY INCREASED SYSTOLIC PRESSURE OF PULMONARY ARTERIES  . VASCULAR SURGERY Right    portacath chest    Family History  Problem Relation Age of Onset  . Heart disease Mother        due to mitral valve regurgiation,   . Cancer Mother        breast  . Allergic rhinitis Mother   . Parkinsonism Father   . Allergic rhinitis Father   . Migraines Father   . Alcohol abuse Paternal Grandfather   . Angioedema Neg Hx   . Asthma Neg Hx   . Eczema Neg Hx     Social History:  reports that she has never smoked. She has never used smokeless tobacco. She reports that she does not drink alcohol or use drugs.  Allergies:  Allergies  Allergen Reactions  . Penicillins Hives, Itching and Rash    Has patient had a PCN reaction causing immediate rash, facial/tongue/throat swelling, SOB or lightheadedness with hypotension: no Has patient had a PCN reaction causing severe rash involving mucus membranes or skin necrosis: No Has patient had a PCN reaction that required hospitalization No Has patient had a PCN reaction occurring within the last 10 years: yes If all of the above answers are "NO", then may  proceed with Cephalosporin use.   Denies airway involvement   . Other Rash    STERI STRIPS - Blisters  . Tape Hives    Can tolerate paper tape      Results for orders placed or performed during the hospital encounter of 12/12/16 (from the past 48 hour(s))  CBC with Differential     Status: Abnormal   Collection Time: 12/12/16  1:25 PM  Result Value Ref Range   WBC 6.2 4.0 - 10.5 K/uL   RBC 2.00 (L) 3.87 - 5.11 MIL/uL   Hemoglobin 6.1 (LL) 12.0 - 15.0 g/dL    Comment: REPEATED TO VERIFY CRITICAL RESULT CALLED TO, READ BACK BY AND VERIFIED WITH: M.HAMBY RN AT 1357 ON 12/12/16 BY S.VANHOORNE MLS    HCT 19.2 (L) 36.0 -  46.0 %   MCV 96.0 78.0 - 100.0 fL   MCH 30.5 26.0 - 34.0 pg   MCHC 31.8 30.0 - 36.0 g/dL   RDW 23.2 (H) 11.5 - 15.5 %   Platelets 231 150 - 400 K/uL   Neutrophils Relative % 76 %   Neutro Abs 4.7 1.7 - 7.7 K/uL   Lymphocytes Relative 16 %   Lymphs Abs 1.0 0.7 - 4.0 K/uL   Monocytes Relative 7 %   Monocytes Absolute 0.4 0.1 - 1.0 K/uL   Eosinophils Relative 1 %   Eosinophils Absolute 0.1 0.0 - 0.7 K/uL   Basophils Relative 1 %   Basophils Absolute 0.0 0.0 - 0.1 K/uL  Comprehensive metabolic panel     Status: Abnormal   Collection Time: 12/12/16  1:25 PM  Result Value Ref Range   Sodium 135 135 - 145 mmol/L   Potassium 3.6 3.5 - 5.1 mmol/L   Chloride 99 (L) 101 - 111 mmol/L   CO2 28 22 - 32 mmol/L   Glucose, Bld 99 65 - 99 mg/dL   BUN 13 6 - 20 mg/dL   Creatinine, Ser 0.62 0.44 - 1.00 mg/dL   Calcium 8.5 (L) 8.9 - 10.3 mg/dL   Total Protein 6.7 6.5 - 8.1 g/dL   Albumin 2.8 (L) 3.5 - 5.0 g/dL   AST 46 (H) 15 - 41 U/L   ALT 24 14 - 54 U/L   Alkaline Phosphatase 121 38 - 126 U/L   Total Bilirubin 1.7 (H) 0.3 - 1.2 mg/dL   GFR calc non Af Amer >60 >60 mL/min   GFR calc Af Amer >60 >60 mL/min    Comment: (NOTE) The eGFR has been calculated using the CKD EPI equation. This calculation has not been validated in all clinical situations. eGFR's  persistently <60 mL/min signify possible Chronic Kidney Disease.    Anion gap 8 5 - 15  Protime-INR     Status: None   Collection Time: 12/12/16  1:25 PM  Result Value Ref Range   Prothrombin Time 14.0 11.4 - 15.2 seconds   INR 1.08   Magnesium     Status: None   Collection Time: 12/12/16  1:25 PM  Result Value Ref Range   Magnesium 1.8 1.7 - 2.4 mg/dL  Type and screen [place]     Status: None (Preliminary result)   Collection Time: 12/12/16  1:30 PM  Result Value Ref Range   ABO/RH(D) A POS    Antibody Screen NEG    Sample Expiration 12/15/2016    Unit Number [id number]    Blood Component Type RED CELLS,LR    Unit division 00    Status of Unit ISSUED,FINAL    Transfusion Status OK TO TRANSFUSE    Crossmatch Result Compatible    Unit Number [id number]    Blood Component Type RED CELLS,LR    Unit division 00    Status of Unit ISSUED,FINAL    Transfusion Status OK TO TRANSFUSE    Crossmatch Result Compatible    Unit Number [id number]    Blood Component Type RED CELLS,LR    Unit division 00    Status of Unit ALLOCATED    Transfusion Status OK TO TRANSFUSE    Crossmatch Result Compatible   Prepare RBC     Status: None   Collection Time: 12/12/16  1:30 PM  Result Value Ref Range   Order Confirmation ORDER PROCESSED BY BLOOD BANK   Urinalysis, Routine w reflex microscopic  Status: None   Collection Time: 12/12/16  3:33 PM  Result Value Ref Range   Color, Urine YELLOW YELLOW   APPearance CLEAR CLEAR   Specific Gravity, Urine 1.010 1.005 - 1.030   pH 7.0 5.0 - 8.0   Glucose, UA NEGATIVE NEGATIVE mg/dL   Hgb urine dipstick NEGATIVE NEGATIVE   Bilirubin Urine NEGATIVE NEGATIVE   Ketones, ur NEGATIVE NEGATIVE mg/dL   Protein, ur NEGATIVE NEGATIVE mg/dL   Nitrite NEGATIVE NEGATIVE   Leukocytes, UA NEGATIVE NEGATIVE  HIV antibody (Routine Testing)     Status: None   Collection Time: 12/12/16  3:49 PM  Result Value Ref Range   HIV Screen  4th Generation wRfx Non Reactive Non Reactive    Comment: (NOTE) Performed At: [facility] [place], [place] [id number] Lindon Romp MD VO:[id number]   Comprehensive metabolic panel     Status: Abnormal   Collection Time: 12/13/16  5:49 AM  Result Value Ref Range   Sodium 134 (L) 135 - 145 mmol/L   Potassium 3.5 3.5 - 5.1 mmol/L   Chloride 97 (L) 101 - 111 mmol/L   CO2 27 22 - 32 mmol/L   Glucose, Bld 101 (H) 65 - 99 mg/dL   BUN 8 6 - 20 mg/dL   Creatinine, Ser 0.63 0.44 - 1.00 mg/dL   Calcium 8.3 (L) 8.9 - 10.3 mg/dL   Total Protein 6.7 6.5 - 8.1 g/dL   Albumin 2.7 (L) 3.5 - 5.0 g/dL   AST 46 (H) 15 - 41 U/L   ALT 23 14 - 54 U/L   Alkaline Phosphatase 127 (H) 38 - 126 U/L   Total Bilirubin 2.2 (H) 0.3 - 1.2 mg/dL   GFR calc non Af Amer >60 >60 mL/min   GFR calc Af Amer >60 >60 mL/min    Comment: (NOTE) The eGFR has been calculated using the CKD EPI equation. This calculation has not been validated in all clinical situations. eGFR's persistently <60 mL/min signify possible Chronic Kidney Disease.    Anion gap 10 5 - 15  CBC     Status: Abnormal   Collection Time: 12/13/16  5:49 AM  Result Value Ref Range   WBC 6.5 4.0 - 10.5 K/uL   RBC 2.99 (L) 3.87 - 5.11 MIL/uL   Hemoglobin 8.9 (L) 12.0 - 15.0 g/dL    Comment: DELTA CHECK NOTED REPEATED TO VERIFY    HCT 27.5 (L) 36.0 - 46.0 %   MCV 92.0 78.0 - 100.0 fL   MCH 29.8 26.0 - 34.0 pg   MCHC 32.4 30.0 - 36.0 g/dL   RDW 22.0 (H) 11.5 - 15.5 %   Platelets 232 150 - 400 K/uL  Vancomycin, trough     Status: None   Collection Time: 12/13/16  9:20 AM  Result Value Ref Range   Vancomycin Tr 18 15 - 20 ug/mL    Dg Chest Port 1 View  Result Date: 12/12/2016 CLINICAL DATA:  61 year old female with history of fever for the past 2 days. Fatigue and weakness. History breast cancer. EXAM: PORTABLE CHEST 1 VIEW COMPARISON:  Chest x-ray 05/10/2016. FINDINGS: Lung volumes are slightly low. No acute  consolidative airspace disease. No pleural effusions. No evidence of pulmonary edema. However, the central pulmonary arteries are very prominent. Heart size is mildly enlarged. The patient is rotated to the right on today's exam, resulting in distortion of the mediastinal contours and reduced diagnostic sensitivity and specificity for mediastinal pathology. Right internal jugular single-lumen porta  cath with tip terminating at the superior cavoatrial junction. Surgical clips noted throughout the chest wall and in the axillary regions bilaterally. IMPRESSION: 1. No definite radiographic evidence of acute cardiopulmonary disease. 2. Cardiomegaly. 3. Dilatation of the proximal pulmonary arteries, concerning for pulmonary arterial hypertension. Electronically Signed   By: Vinnie Langton M.D.   On: 12/12/2016 16:16    Review of Systems  Constitutional: Positive for malaise/fatigue.  HENT: Negative.   Eyes: Negative.   Respiratory: Negative.   Cardiovascular: Negative.   Gastrointestinal: Negative.   Genitourinary: Negative.   Musculoskeletal: Positive for joint pain.  Skin: Negative.   Neurological: Negative.   Endo/Heme/Allergies: Positive for environmental allergies.  Psychiatric/Behavioral: Negative.    Blood pressure 121/69, pulse (!) 110, temperature 100.1 F (37.8 C), temperature source Oral, resp. rate 18, height 5' 7"  (1.702 m), weight 70.3 kg (155 lb), last menstrual period 06/27/2007, SpO2 93 %. Physical Exam  Constitutional: She is oriented to person, place, and time. She appears well-developed.  HENT:  Head: Normocephalic.  Eyes: Pupils are equal, round, and reactive to light.  Neck: Neck supple. No JVD present. No tracheal deviation present. No thyromegaly present.  Cardiovascular: Normal rate, regular rhythm and intact distal pulses.   Murmur heard. Respiratory: Effort normal and breath sounds normal. No respiratory distress. She has no wheezes.  GI: Soft. There is no  tenderness. There is no guarding.  Musculoskeletal:       Right knee: She exhibits decreased range of motion, swelling, deformity, laceration, erythema and bony tenderness. Tenderness found.  Lymphadenopathy:    She has no cervical adenopathy.  Neurological: She is alert and oriented to person, place, and time.  Skin: Skin is warm and dry.  Psychiatric: She has a normal mood and affect.    Assessment/Plan: - Anemia - Infected right TKA with recent resection and placement of antibiotic spacer     Appreciate medicine helping with the treatment of this patient.  ID involved making sure proper treatment is received.  At this time as long as the patient is stable we are planning on bringing her to the [place] on Tuesday, 7/24, for a repeat I&D with wound closure vs application of wound vac.  Will place an order for NPO after midnight on prior to surgery.  If Lovenox is not already stopped due to anemia, would plan on stopping the day prior to surgery.     Pricilla Loveless 12/13/2016, 10:00 AM

## 2016-12-13 NOTE — Progress Notes (Signed)
Page: [phone] [name]'s temp is 101.8 and she has not Tylenol ordered. Pt. states she was on 1000mg  Q8h around the clock @ home per Dr. Ricard Dillon.

## 2016-12-13 NOTE — Progress Notes (Signed)
Pharmacy Antibiotic Note  [name] is a 61 y.o. female admitted on 12/12/2016 with infected right TKA, s/p excisional R TKA with abx spacers on 7/10. Pharmacy has been consulted for vancomycin dosing. Patient recently discharged on 7/13 with plan to continue Vancomycin and Rocephin until 01/13/17 per ID recommendations. Also note that patient has been following with ID clinic for some time and has been a number of antibiotics. Per PTA med list however, patient was instructed to stop Rocephin and continue vancomycin. Patient is on 1g IV q12 with last dose being 7/20 at 0900.  ID recommended to continue vancomycin and ceftriaxone.  Vancomycin trough = 18 mcg/ml this morning on 1g q12h dosing SCr WNL and stable  Plan: Continue vancomycin 1g IV q12h.   Height: 5\' 7"  (170.2 cm) Weight: 155 lb (70.3 kg) IBW/kg (Calculated) : 61.6  Temp (24hrs), Avg:99.3 F (37.4 C), Min:98.6 F (37 C), Max:100.1 F (37.8 C)   Recent Labs Lab 12/12/16 1325 12/13/16 0549 12/13/16 0920  WBC 6.2 6.5  --   CREATININE 0.62 0.63  --   VANCOTROUGH  --   --  18    Estimated Creatinine Clearance: 72.7 mL/min (by C-G formula based on SCr of 0.63 mg/dL).    Allergies  Allergen Reactions  . Penicillins Hives, Itching and Rash    Has patient had a PCN reaction causing immediate rash, facial/tongue/throat swelling, SOB or lightheadedness with hypotension: no Has patient had a PCN reaction causing severe rash involving mucus membranes or skin necrosis: No Has patient had a PCN reaction that required hospitalization No Has patient had a PCN reaction occurring within the last 10 years: yes If all of the above answers are "NO", then may proceed with Cephalosporin use.   Denies airway involvement   . Other Rash    STERI STRIPS - Blisters  . Tape Hives    Can tolerate paper tape    Thank you for allowing pharmacy to be a part of this patient's care.   Hershal Coria, PharmD, BCPS Pager:  [phone] 12/13/2016 10:30 AM

## 2016-12-13 NOTE — Progress Notes (Signed)
Subjective:  Feels better   Objective: Vital signs in last 24 hours: Temp:  [98.6 F (37 C)-100.1 F (37.8 C)] 100.1 F (37.8 C) (07/21 0506) Pulse Rate:  [94-118] 110 (07/21 0506) Resp:  [16-20] 18 (07/21 0506) BP: (118-136)/(59-78) 121/69 (07/21 0506) SpO2:  [92 %-97 %] 93 % (07/21 0506) Weight:  [70.3 kg (155 lb)] 70.3 kg (155 lb) (07/20 1232)  Intake/Output from previous day: 07/20 0701 - 07/21 0700 In: 1020 [P.O.:100; Blood:670; IV Piggyback:250] Out: -  Intake/Output this shift: No intake/output data recorded.   Recent Labs  12/12/16 1325 12/13/16 0549  HGB 6.1* 8.9*    Recent Labs  12/12/16 1325 12/13/16 0549  WBC 6.2 6.5  RBC 2.00* 2.99*  HCT 19.2* 27.5*  PLT 231 232    Recent Labs  12/12/16 1325 12/13/16 0549  NA 135 134*  K 3.6 3.5  CL 99* 97*  CO2 28 27  BUN 13 8  CREATININE 0.62 0.63  GLUCOSE 99 101*  CALCIUM 8.5* 8.3*    Recent Labs  12/12/16 1325  INR 1.08    Neurologically intact Sensation intact distally Dorsiflexion/Plantar flexion intact Compartment soft Moderate swelling Ecchymosis  Assessment/Plan: Elevate. Immobilizer when OOB. May need to decrease lovenox dose. Discussed with patient.   Travarus Trudo C 12/13/2016, 9:13 AM

## 2016-12-14 LAB — BASIC METABOLIC PANEL
ANION GAP: 8 (ref 5–15)
BUN: 12 mg/dL (ref 6–20)
CALCIUM: 8.7 mg/dL — AB (ref 8.9–10.3)
CO2: 28 mmol/L (ref 22–32)
Chloride: 102 mmol/L (ref 101–111)
Creatinine, Ser: 0.63 mg/dL (ref 0.44–1.00)
GFR calc Af Amer: >60 mL/min (ref >60)
GLUCOSE: 101 mg/dL — AB (ref 65–99)
POTASSIUM: 3.8 mmol/L (ref 3.5–5.1)
SODIUM: 138 mmol/L (ref 135–145)

## 2016-12-14 LAB — CBC
HCT: 28 % — ABNORMAL LOW (ref 36.0–46.0)
Hemoglobin: 8.9 g/dL — ABNORMAL LOW (ref 12.0–15.0)
MCH: 30.3 pg (ref 26.0–34.0)
MCHC: 31.8 g/dL (ref 30.0–36.0)
MCV: 95.2 fL (ref 78.0–100.0)
PLATELETS: 223 10*3/uL (ref 150–400)
RBC: 2.94 MIL/uL — AB (ref 3.87–5.11)
RDW: 23.2 % — AB (ref 11.5–15.5)
WBC: 6.1 10*3/uL (ref 4.0–10.5)

## 2016-12-14 LAB — HEPARIN ANTI-XA: HEPARIN LMW: 0.42 [IU]/mL

## 2016-12-14 MED ORDER — POVIDONE-IODINE 10 % EX SWAB: 2.0000 "application " | Freq: Once | CUTANEOUS | Status: DC

## 2016-12-14 MED ORDER — CHLORHEXIDINE GLUCONATE 4 % EX LIQD
60.0000 mL | Freq: Once | CUTANEOUS | Status: DC
  Filled 2016-12-14: qty 60

## 2016-12-14 MED ORDER — ENOXAPARIN SODIUM 80 MG/0.8ML ~~LOC~~ SOLN
70.0000 mg | SUBCUTANEOUS | Status: DC
  Administered 2016-12-14: 70 mg via SUBCUTANEOUS
  Filled 2016-12-14: qty 0.8

## 2016-12-14 NOTE — Progress Notes (Signed)
Subjective: Patient reports pain as mild.   Patient seen in rounds for Dr. Alvan Dame. Patient is well, and has had no acute complaints or problems other than some discomfort in the knee. She reports that her temperature went down with the Tylenol last night. No SOB or chest pain.    Objective: Vital signs in last 24 hours: Temp:  [98.6 F (37 C)-101.8 F (38.8 C)] 98.6 F (37 C) (07/22 0525) Pulse Rate:  [91-110] 91 (07/22 0525) Resp:  [18-20] 18 (07/22 0525) BP: (107-135)/(57-78) 135/78 (07/22 0525) SpO2:  [92 %-99 %] 98 % (07/22 0525)  Intake/Output from previous day:  Intake/Output Summary (Last 24 hours) at 12/14/16 0955 Last data filed at 12/14/16 0835  Gross per 24 hour  Intake          1790.83 ml  Output                0 ml  Net          1790.83 ml    Intake/Output this shift: Total I/O In: 240 [P.O.:240] Out: -   Labs:  Recent Labs  12/12/16 1325 12/13/16 0549 12/13/16 2105 12/14/16 0515  HGB 6.1* 8.9* 8.4* 8.9*    Recent Labs  12/13/16 0549 12/13/16 1300 12/13/16 2105 12/14/16 0515  WBC 6.5  --   --  6.1  RBC 2.99* 2.87*  --  2.94*  HCT 27.5*  --  26.3* 28.0*  PLT 232  --   --  223    Recent Labs  12/13/16 0549 12/14/16 0515  NA 134* 138  K 3.5 3.8  CL 97* 102  CO2 27 28  BUN 8 12  CREATININE 0.63 0.63  GLUCOSE 101* 101*  CALCIUM 8.3* 8.7*    Recent Labs  12/12/16 1325  INR 1.08    EXAM General - Patient is Alert and Oriented Extremity - Neurologically intact Intact pulses distally Dorsiflexion/Plantar flexion intact No cellulitis present Compartment soft Dressing/Incision - moderate bloody drainage from distal portion of wound Motor Function - intact, moving foot and toes well on exam  Past Medical History:  Diagnosis Date  . 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 Allergic reaction 07/24/2016  . Arthritis KNEES   right knee, hx. past left knee replacemnt.  . Blood clot in vein    around Portacath right chest-tx.  Eliquis about a yr., prior Lovenox.  . Blood dyscrasia   . Chronic anticoagulation HX  PORT-A-CATH CLOT   2010 - HAS BEEN ON LOVENOX SINCE THE CLOT  . Drug rash 07/24/2016  . Elevated blood pressure reading without diagnosis of hypertension    PT MONITORS HER B/P AT HOME  . Heart murmur   . 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 PONV (postoperative nausea and vomiting)    ponv likes scopolamine patch  . Skin cancer of anterior chest BREAST CANCER PRIMARY W/ METS TO CHEST WALL SKIN CANCER--  CHEMOTHERAPY EVERY 3 WEEKS AT [facility]   left 9 yrs ago, chemo every 3 weeks at [place], last chemo july 3rd  . Status post skin flap graft    right chest wall with metastatis right anterior chest -no drainage or open wound.    Assessment/Plan:    Principal Problem:   Acute blood loss anemia Active Problems:   Metastatic breast cancer (HCC)   Anxiety,  generalized   Postoperative anemia due to acute blood loss   S/P total knee arthroplasty, right   Right knee abx spacer  Estimated body mass index is 24.28 kg/m as calculated from the following:   Height as of this encounter: '5\' 7"'$  (1.702 m).   Weight as of this encounter: 70.3 kg (155 lb).   Advance diet until NPO 7/24 Continue to monitor Hgb Plan for wound closure and placement of vac 7/24 pending improvement of Hgb   Ardeen Jourdain, PA-C Orthopaedic Surgery 12/14/2016, 9:55 AM

## 2016-12-14 NOTE — Progress Notes (Signed)
PROGRESS NOTE    [name]  AGT:[id number] DOB: 03-Jul-1955 DOA: 12/12/2016 PCP: Midge Minium, MD    Brief Narrative:  61 year old female 60 y.o.femalewith a history of breast cancer followed at [facility] by Force, Delice Bison, MD, status post most recent chemotherapy on 11/25/16, prior history of DVT (on eliquis which was switched to Lovenox in June 2018)  , left knee arthroplasty in 2009 and then right TKA 22/48/2500 complicated by a fall and underwent 3  incisions and drainage since  November 2018. She took cephalexin after that but had persistent drainage with cellulitis and treated with a short course of vancomycin + aztreonam and transitioned to cipro for possible UTI but continued drainage and readmitted in January 2018. She underwent I and D and poly-exchange and VAC placed and joint cultures grew Enterococcus faecalis and completed 6 weeks of IV vancomcyin and transitioned to oral amoxicillin after an allergy test for penicillin but developed hives. She then transitioned to oral linezolid and took about 6 weeks but stopped on her own with concern for side effects. She did require surgery on her wound by Dr. Baltazar Apo in March and had done well but developed a hematoma after bumping her knee in June and was debrided and grew an anaerob and was put on metronidazole which she took until 3 weeks ago.  she was readmitted in July and underwent resection of right total knee arthroplasty with placement of antibiotic spacer by Dr. Alvan Dame.   She was discharged on 12/05/16 with IV vancomycin and Rocephin to be completed through 01/13/17 through home health. Patient has only been receiving IV vancomycin since her discharge on 7/13, family and Dr Alvan Dame do not know why . Dr. Alvan Dame has been following her hemoglobin and a hemoglobin drop from 7-5.4 on blood work yesterday. Patient was advised to come to the ED. She was advised to hold Lovenox and she has not taken any in the last 24 hours.  Assessment &  Plan:   Principal Problem:   Acute blood loss anemia Active Problems:   Metastatic breast cancer (HCC)   Anxiety, generalized   Postoperative anemia due to acute blood loss   S/P total knee arthroplasty, right   Right knee abx spacer  1-Acute blood loss anemia; multifactorial  Combination of acute blood loss anemia secondary to right knee hematoma,, anemia of chronic disease Patient gets Kadcyla, which doesn't cause anemia.  S/P 2 units PRBC.  Hb at 8.9.  Monitor on Lovenox. Continue with current dose, level low.   haptoglobin pending , LDH 300. retic count.  Hb stable.   History of DVT associated with Port-A-Cath-continue Lovenox as above   Prosthetic Joint infection, hematoma;  Report draining pus at home.  plan for I and D on Tuesday.  Continue with IV vancomycin and ceftriaxone.  Ortho following.        DVT prophylaxis: on lovenox Code Status: full code.  Family Communication: (husband and daughter Disposition Plan: to be determine.  Consultants:   ID  Ortho   Procedures: none   Antimicrobials:   Vancomycin  Ceftriaxone.    Subjective: She is feeling well, report right knee dressing were change today. The hematoma looks stable  Objective: Vitals:   12/13/16 1405 12/13/16 2042 12/14/16 0045 12/14/16 0525  BP: (!) 118/57 107/69  135/78  Pulse: (!) 104 (!) 110  91  Resp: 18 20  18   Temp: 99.1 F (37.3 C) (!) 101.8 F (38.8 C) 99.4 F (37.4 C) 98.6 F (37  C)  TempSrc: Oral Oral Oral Oral  SpO2: 99% 92%  98%  Weight:      Height:        Intake/Output Summary (Last 24 hours) at 12/14/16 1008 Last data filed at 12/14/16 0835  Gross per 24 hour  Intake          1790.83 ml  Output                0 ml  Net          1790.83 ml   Filed Weights   12/12/16 1232  Weight: 70.3 kg (155 lb)    Examination:  General exam: NAD Respiratory system:  CTA, normal respiratory effort.  Cardiovascular system; S 1, S 2 RRR Gastrointestinal system:  abdomen soft, nt  Central nervous system: alert and oriented times 3 Extremities: left 5 x 5 power. Right knee with clean dressing  Psychiatry: Judgement and insight appear normal. Mood & affect appropriate.     Data Reviewed: I have personally reviewed following labs and imaging studies  CBC:  Recent Labs Lab 12/12/16 1325 12/13/16 0549 12/13/16 2105 12/14/16 0515  WBC 6.2 6.5  --  6.1  NEUTROABS 4.7  --   --   --   HGB 6.1* 8.9* 8.4* 8.9*  HCT 19.2* 27.5* 26.3* 28.0*  MCV 96.0 92.0  --  95.2  PLT 231 232  --  300   Basic Metabolic Panel:  Recent Labs Lab 12/12/16 1325 12/13/16 0549 12/14/16 0515  NA 135 134* 138  K 3.6 3.5 3.8  CL 99* 97* 102  CO2 28 27 28   GLUCOSE 99 101* 101*  BUN 13 8 12   CREATININE 0.62 0.63 0.63  CALCIUM 8.5* 8.3* 8.7*  MG 1.8  --   --    GFR: Estimated Creatinine Clearance: 72.7 mL/min (by C-G formula based on SCr of 0.63 mg/dL). Liver Function Tests:  Recent Labs Lab 12/12/16 1325 12/13/16 0549 12/13/16 1300  AST 46* 46*  --   ALT 24 23  --   ALKPHOS 121 127*  --   BILITOT 1.7* 2.2* 1.6*  PROT 6.7 6.7  --   ALBUMIN 2.8* 2.7*  --    No results for input(s): LIPASE, AMYLASE in the last 168 hours. No results for input(s): AMMONIA in the last 168 hours. Coagulation Profile:  Recent Labs Lab 12/12/16 1325  INR 1.08   Cardiac Enzymes: No results for input(s): CKTOTAL, CKMB, CKMBINDEX, TROPONINI in the last 168 hours. BNP (last 3 results) No results for input(s): PROBNP in the last 8760 hours. HbA1C: No results for input(s): HGBA1C in the last 72 hours. CBG: No results for input(s): GLUCAP in the last 168 hours. Lipid Profile: No results for input(s): CHOL, HDL, LDLCALC, TRIG, CHOLHDL, LDLDIRECT in the last 72 hours. Thyroid Function Tests: No results for input(s): TSH, T4TOTAL, FREET4, T3FREE, THYROIDAB in the last 72 hours. Anemia Panel:  Recent Labs  12/13/16 1300  RETICCTPCT 8.4*   Sepsis Labs: No results for  input(s): PROCALCITON, LATICACIDVEN in the last 168 hours.  No results found for this or any previous visit (from the past 240 hour(s)).       Radiology Studies: Dg Chest Port 1 View  Result Date: 12/12/2016 CLINICAL DATA:  61 year old female with history of fever for the past 2 days. Fatigue and weakness. History breast cancer. EXAM: PORTABLE CHEST 1 VIEW COMPARISON:  Chest x-ray 05/10/2016. FINDINGS: Lung volumes are slightly low. No acute consolidative airspace disease. No pleural  effusions. No evidence of pulmonary edema. However, the central pulmonary arteries are very prominent. Heart size is mildly enlarged. The patient is rotated to the right on today's exam, resulting in distortion of the mediastinal contours and reduced diagnostic sensitivity and specificity for mediastinal pathology. Right internal jugular single-lumen porta cath with tip terminating at the superior cavoatrial junction. Surgical clips noted throughout the chest wall and in the axillary regions bilaterally. IMPRESSION: 1. No definite radiographic evidence of acute cardiopulmonary disease. 2. Cardiomegaly. 3. Dilatation of the proximal pulmonary arteries, concerning for pulmonary arterial hypertension. Electronically Signed   By: Vinnie Langton M.D.   On: 12/12/2016 16:16        Scheduled Meds: . cholecalciferol  2,000 Units Oral Once per day on Sun Tue Thu Sat  . darifenacin  15 mg Oral Daily  . enoxaparin (LOVENOX) injection  70 mg Subcutaneous Q24H  . ferrous sulfate  325 mg Oral TID PC  . gabapentin  300 mg Oral TID  . polyethylene glycol  17 g Oral BID   Continuous Infusions: . sodium chloride 50 mL/hr at 12/13/16 1815  . cefTRIAXone (ROCEPHIN)  IV 2 g (12/13/16 1815)  . vancomycin 1,000 mg (12/14/16 0947)     LOS: 2 days    Time spent: 35    Asiel Chrostowski, Cassie Freer, MD [facility] Pager [phone]  If 7PM-7AM, please contact night-coverage [email or web address] Password [email or web address] 12/14/2016, 10:08  AM

## 2016-12-15 DIAGNOSIS — M009 Pyogenic arthritis, unspecified: Secondary | ICD-10-CM | POA: Diagnosis present

## 2016-12-15 LAB — CBC
HEMATOCRIT: 27.5 % — AB (ref 36.0–46.0)
HEMATOCRIT: 28.1 % — AB (ref 36.0–46.0)
HEMOGLOBIN: 8.9 g/dL — AB (ref 12.0–15.0)
Hemoglobin: 9 g/dL — ABNORMAL LOW (ref 12.0–15.0)
MCH: 29.8 pg (ref 26.0–34.0)
MCH: 30.8 pg (ref 26.0–34.0)
MCHC: 32.4 g/dL (ref 30.0–36.0)
MCHC: 32 g/dL (ref 30.0–36.0)
MCV: 92 fL (ref 78.0–100.0)
MCV: 96.2 fL (ref 78.0–100.0)
PLATELETS: 222 10*3/uL (ref 150–400)
Platelets: 232 10*3/uL (ref 150–400)
RBC: 2.99 MIL/uL — AB (ref 3.87–5.11)
RBC: 2.92 MIL/uL — ABNORMAL LOW (ref 3.87–5.11)
RDW: 22 % — ABNORMAL HIGH (ref 11.5–15.5)
RDW: 23.7 % — ABNORMAL HIGH (ref 11.5–15.5)
WBC: 6.5 10*3/uL (ref 4.0–10.5)
WBC: 6.3 10*3/uL (ref 4.0–10.5)

## 2016-12-15 LAB — BASIC METABOLIC PANEL
ANION GAP: 6 (ref 5–15)
BUN: 16 mg/dL (ref 6–20)
CHLORIDE: 99 mmol/L — AB (ref 101–111)
CO2: 27 mmol/L (ref 22–32)
Calcium: 8.5 mg/dL — ABNORMAL LOW (ref 8.9–10.3)
Creatinine, Ser: 0.63 mg/dL (ref 0.44–1.00)
GFR calc Af Amer: >60 mL/min (ref >60)
GFR calc non Af Amer: >60 mL/min (ref >60)
GLUCOSE: 106 mg/dL — AB (ref 65–99)
POTASSIUM: 3.8 mmol/L (ref 3.5–5.1)
Sodium: 132 mmol/L — ABNORMAL LOW (ref 135–145)

## 2016-12-15 LAB — HEPATIC FUNCTION PANEL
ALK PHOS: 113 U/L (ref 38–126)
ALT: 20 U/L (ref 14–54)
AST: 43 U/L — AB (ref 15–41)
Albumin: 2.7 g/dL — ABNORMAL LOW (ref 3.5–5.0)
BILIRUBIN DIRECT: 0.3 mg/dL (ref 0.1–0.5)
BILIRUBIN INDIRECT: 1.2 mg/dL — AB (ref 0.3–0.9)
BILIRUBIN TOTAL: 1.5 mg/dL — AB (ref 0.3–1.2)
Total Protein: 6.8 g/dL (ref 6.5–8.1)

## 2016-12-15 NOTE — Progress Notes (Signed)
INFECTIOUS DISEASE PROGRESS NOTE  ID: [name] is a 61 y.o. female with  Principal Problem:   Acute blood loss anemia Active Problems:   Metastatic breast cancer (HCC)   Anxiety, generalized   Postoperative anemia due to acute blood loss   S/P total knee arthroplasty, right   Right knee abx spacer  Subjective: No problems with anbx (no sores in mouth, no rash or pruritis).    Abtx:  Anti-infectives    Start     Dose/Rate Route Frequency Ordered Stop   12/12/16 2100  vancomycin (VANCOCIN) IVPB 1000 mg/200 mL premix     1,000 mg 200 mL/hr over 60 Minutes Intravenous Every 12 hours 12/12/16 1551     12/12/16 1830  cefTRIAXone (ROCEPHIN) 2 g in dextrose 5 % 50 mL IVPB     2 g 100 mL/hr over 30 Minutes Intravenous Every 24 hours 12/12/16 1822     12/12/16 1715  cefTRIAXone (ROCEPHIN) IVPB  Status:  Discontinued    Comments:  Indication:  Prosthetic Joint Infection Last Day of Therapy:  01/13/17 Labs - Once weekly:  CBC/D and BMP, Labs - Every other week:  ESR and CRP     2 g Intravenous Every 24 hours 12/12/16 1705 12/12/16 1710   12/12/16 1715  vancomycin IVPB  Status:  Discontinued    Comments:  Indication:  Prosthetic Joint Infection Last Day of Therapy:  01/13/17 Labs - Sunday/Monday:  CBC/D, BMP, and vancomycin trough. Labs - Thursday:  BMP and vancomycin trough Labs - Every other week:  ESR and CRP     1,000 mg Intravenous Every 12 hours 12/12/16 1705 12/12/16 1708      Medications:  Scheduled: . chlorhexidine  60 mL Topical Once  . cholecalciferol  2,000 Units Oral Once per day on Sun Tue Thu Sat  . darifenacin  15 mg Oral Daily  . ferrous sulfate  325 mg Oral TID PC  . gabapentin  300 mg Oral TID  . polyethylene glycol  17 g Oral BID  . povidone-iodine  2 application Topical Once    Objective: Vital signs in last 24 hours: Temp:  [99 F (37.2 C)-100.3 F (37.9 C)] 100.3 F (37.9 C) (07/23 1448) Pulse Rate:  [106-113] 106 (07/23 1448) Resp:  [18] 18  (07/23 1448) BP: (103-131)/(56-82) 103/56 (07/23 1448) SpO2:  [95 %-96 %] 96 % (07/23 1448)   General appearance: alert, cooperative and no distress Resp: clear to auscultation bilaterally Chest wall: R upper chest port with no fluctuance, no tenderness. mild-mod erythema.  Cardio: regular rate and rhythm GI: normal findings: bowel sounds normal and soft, non-tender Extremities: edema > 3+ RLE and RLE wrapped at knee.   Lab Results  Recent Labs  12/14/16 0515 12/15/16 0437  WBC 6.1 6.3  HGB 8.9* 9.0*  HCT 28.0* 28.1*  NA 138 132*  K 3.8 3.8  CL 102 99*  CO2 28 27  BUN 12 16  CREATININE 0.63 0.63   Liver Panel  Recent Labs  12/13/16 0549 12/13/16 1300 12/15/16 0437  PROT 6.7  --  6.8  ALBUMIN 2.7*  --  2.7*  AST 46*  --  43*  ALT 23  --  20  ALKPHOS 127*  --  113  BILITOT 2.2* 1.6* 1.5*  BILIDIR  --  0.2 0.3  IBILI  --  1.4* 1.2*   Sedimentation Rate No results for input(s): ESRSEDRATE in the last 72 hours. C-Reactive Protein No results for input(s): CRP in  the last 72 hours.  Microbiology: Recent Results (from the past 240 hour(s))  Culture, blood (routine x 2)     Status: None (Preliminary result)   Collection Time: 12/12/16  3:49 PM  Result Value Ref Range Status   Specimen Description BLOOD PORT  Final   Special Requests   Final    BOTTLES DRAWN AEROBIC AND ANAEROBIC Blood Culture adequate volume   Culture   Final    NO GROWTH 3 DAYS Performed at [facility], [address], [place], [place] [address]    Report Status PENDING  Incomplete  Culture, blood (routine x 2)     Status: None (Preliminary result)   Collection Time: 12/12/16  3:54 PM  Result Value Ref Range Status   Specimen Description BLOOD BLOOD RIGHT ARM  Final   Special Requests   Final    BOTTLES DRAWN AEROBIC AND ANAEROBIC Blood Culture adequate volume   Culture   Final    NO GROWTH 3 DAYS Performed at [facility], [place] [address], [place], [place] [address]     Report Status PENDING  Incomplete    Studies/Results: No results found.   Assessment/Plan: PJI (E faecalis)  Hemarthrosis  Breast CA with mets to chest wall On chemotherapy at [facility] (last 7-3).    Total days of antibiotics: 14  vanco (7-9), ceftriaxone (7-19)  She has had fever. Query if from blood in her knee.  Will continue her current anbx BCx from adm no growth.  H/H has improved.  To OR in AM         Bobby Rumpf Infectious Diseases (pager) [phone] [email or web address] 12/15/2016, 3:52 PM  LOS: 3 days

## 2016-12-15 NOTE — Care Management Note (Signed)
Case Management Note  Patient Details  Name: [name] MRN: [id number] Date of Birth: 1955-10-25  Subjective/Objective:    anemia                Action/Plan: Date:  December 15 2016  Chart reviewed for concurrent status and case management needs.  Will continue to follow patient progress.  Discharge Planning: following for needs  Expected discharge date: [id number]  Velva Harman, BSN, [place], [place]   Expected Discharge Date:   (unknown)               Expected Discharge Plan:  [place]  In-House Referral:     Discharge planning Services  CM Consult  Post Acute Care Choice:    Choice offered to:     DME Arranged:    DME Agency:     HH Arranged:    [place] Agency:     Status of Service:  In process, will continue to follow  If discussed at Long Length of Stay Meetings, dates discussed:    Additional Comments:  Leeroy Cha, RN 12/15/2016, 9:38 AM

## 2016-12-15 NOTE — Progress Notes (Signed)
Patient ID: [name], female   DOB: 1956/04/01, 61 y.o.   MRN: [id number]  Doing ok, feeling better after receiving blood  Hgb - 9  Plan: Have spoken to Dr. Marla Roe about trying to look at her wound today prior to taking her to the OR tomorrow to get her insight on current and long term management of this wound  Hold lovenox for OR tomorrow NPO after midnight To OR tomorrow for knee I&D and further management of her wound Consent ordered

## 2016-12-15 NOTE — Progress Notes (Signed)
PROGRESS NOTE    [name]  JOI:[id number] DOB: 06-13-1955 DOA: 12/12/2016 PCP: Midge Minium, MD    Brief Narrative:  61 year old female 61 y.o.femalewith a history of breast cancer followed at [facility] by Force, Delice Bison, MD, status post most recent chemotherapy on 11/25/16, prior history of DVT (on eliquis which was switched to Lovenox in June 2018)  , left knee arthroplasty in 2009 and then right TKA 47/01/6282 complicated by a fall and underwent 3  incisions and drainage since  November 2018. She took cephalexin after that but had persistent drainage with cellulitis and treated with a short course of vancomycin + aztreonam and transitioned to cipro for possible UTI but continued drainage and readmitted in January 2018. She underwent I and D and poly-exchange and VAC placed and joint cultures grew Enterococcus faecalis and completed 6 weeks of IV vancomcyin and transitioned to oral amoxicillin after an allergy test for penicillin but developed hives. She then transitioned to oral linezolid and took about 6 weeks but stopped on her own with concern for side effects. She did require surgery on her wound by Dr. Baltazar Apo in March and had done well but developed a hematoma after bumping her knee in June and was debrided and grew an anaerob and was put on metronidazole which she took until 3 weeks ago.  she was readmitted in July and underwent resection of right total knee arthroplasty with placement of antibiotic spacer by Dr. Alvan Dame.   She was discharged on 12/05/16 with IV vancomycin and Rocephin to be completed through 01/13/17 through home health. Patient has only been receiving IV vancomycin since her discharge on 7/13, family and Dr Alvan Dame do not know why . Dr. Alvan Dame has been following her hemoglobin and a hemoglobin drop from 7-5.4 on blood work yesterday. Patient was advised to come to the ED. She was advised to hold Lovenox and she has not taken any in the last 24 hours.  Assessment &  Plan:   Principal Problem:   Acute blood loss anemia Active Problems:   Metastatic breast cancer (HCC)   Anxiety, generalized   Postoperative anemia due to acute blood loss   S/P total knee arthroplasty, right   Right knee abx spacer  1-Acute blood loss anemia; multifactorial  Combination of acute blood loss anemia secondary to right knee hematoma,, anemia of chronic disease Patient gets Kadcyla, which doesn't cause anemia.  S/P 2 units PRBC.  Hb at 9 Plan to hold lovenox prior to procedure.  haptoglobin pending , LDH 300. retic count.  Hb stable.   History of DVT associated with Port-A-Cath-continue Lovenox as above  Plan to hold Lovenox today prior to procedure.   Prosthetic Joint infection, hematoma;  Report draining pus at home.  plan for I and D on Tuesday.  Continue with IV vancomycin and ceftriaxone.  Ortho following.  Nutrition consultation   Mild hyponatremia; continue with  IV fluids.      DVT prophylaxis: on lovenox Code Status: full code.  Family Communication: (husband and daughter Disposition Plan: to be determine.  Consultants:   ID  Ortho   Procedures: none   Antimicrobials:   Vancomycin  Ceftriaxone.    Subjective:  she is feeling ok, knee pain is not worse.   Low garde fever last night.  Objective: Vitals:   12/14/16 0045 12/14/16 0525 12/14/16 1428 12/14/16 2014  BP:  135/78 (!) 111/58 131/82  Pulse:  91 90 (!) 113  Resp:  18 18 18   Temp:  99.4 F (37.4 C) 98.6 F (37 C) 100 F (37.8 C) 99 F (37.2 C)  TempSrc: Oral Oral Oral Oral  SpO2:  98% 94% 95%  Weight:      Height:        Intake/Output Summary (Last 24 hours) at 12/15/16 0904 Last data filed at 12/15/16 1610  Gross per 24 hour  Intake          2436.67 ml  Output              175 ml  Net          2261.67 ml   Filed Weights   12/12/16 1232  Weight: 70.3 kg (155 lb)    Examination:  General exam: NAD Respiratory system:  CTA Cardiovascular system; S 1,  S 2 RRR Gastrointestinal system: Abdomen soft, nt, nr Central nervous system: alert and oriented.  Extremities: left 5 x 5 power. Right knee with dressing.  Psychiatry: Judgement and insight appear normal. Mood & affect appropriate.     Data Reviewed: I have personally reviewed following labs and imaging studies  CBC:  Recent Labs Lab 12/12/16 1325 12/13/16 0549 12/13/16 2105 12/14/16 0515 12/15/16 0437  WBC 6.2 6.5  --  6.1 6.3  NEUTROABS 4.7  --   --   --   --   HGB 6.1* 8.9* 8.4* 8.9* 9.0*  HCT 19.2* 27.5* 26.3* 28.0* 28.1*  MCV 96.0 92.0  --  95.2 96.2  PLT 231 232  --  223 960   Basic Metabolic Panel:  Recent Labs Lab 12/12/16 1325 12/13/16 0549 12/14/16 0515 12/15/16 0437  NA 135 134* 138 132*  K 3.6 3.5 3.8 3.8  CL 99* 97* 102 99*  CO2 28 27 28 27   GLUCOSE 99 101* 101* 106*  BUN 13 8 12 16   CREATININE 0.62 0.63 0.63 0.63  CALCIUM 8.5* 8.3* 8.7* 8.5*  MG 1.8  --   --   --    GFR: Estimated Creatinine Clearance: 72.7 mL/min (by C-G formula based on SCr of 0.63 mg/dL). Liver Function Tests:  Recent Labs Lab 12/12/16 1325 12/13/16 0549 12/13/16 1300 12/15/16 0437  AST 46* 46*  --  43*  ALT 24 23  --  20  ALKPHOS 121 127*  --  113  BILITOT 1.7* 2.2* 1.6* 1.5*  PROT 6.7 6.7  --  6.8  ALBUMIN 2.8* 2.7*  --  2.7*   No results for input(s): LIPASE, AMYLASE in the last 168 hours. No results for input(s): AMMONIA in the last 168 hours. Coagulation Profile:  Recent Labs Lab 12/12/16 1325  INR 1.08   Cardiac Enzymes: No results for input(s): CKTOTAL, CKMB, CKMBINDEX, TROPONINI in the last 168 hours. BNP (last 3 results) No results for input(s): PROBNP in the last 8760 hours. HbA1C: No results for input(s): HGBA1C in the last 72 hours. CBG: No results for input(s): GLUCAP in the last 168 hours. Lipid Profile: No results for input(s): CHOL, HDL, LDLCALC, TRIG, CHOLHDL, LDLDIRECT in the last 72 hours. Thyroid Function Tests: No results for  input(s): TSH, T4TOTAL, FREET4, T3FREE, THYROIDAB in the last 72 hours. Anemia Panel:  Recent Labs  12/13/16 1300  RETICCTPCT 8.4*   Sepsis Labs: No results for input(s): PROCALCITON, LATICACIDVEN in the last 168 hours.  Recent Results (from the past 240 hour(s))  Culture, blood (routine x 2)     Status: None (Preliminary result)   Collection Time: 12/12/16  3:49 PM  Result Value Ref Range Status  Specimen Description BLOOD PORT  Final   Special Requests   Final    BOTTLES DRAWN AEROBIC AND ANAEROBIC Blood Culture adequate volume   Culture   Final    NO GROWTH 2 DAYS Performed at [facility], [address], [place], [place] [address]    Report Status PENDING  Incomplete  Culture, blood (routine x 2)     Status: None (Preliminary result)   Collection Time: 12/12/16  3:54 PM  Result Value Ref Range Status   Specimen Description BLOOD BLOOD RIGHT ARM  Final   Special Requests   Final    BOTTLES DRAWN AEROBIC AND ANAEROBIC Blood Culture adequate volume   Culture   Final    NO GROWTH 2 DAYS Performed at [facility], [place] [address] [place], [place] [address]    Report Status PENDING  Incomplete         Radiology Studies: No results found.      Scheduled Meds: . chlorhexidine  60 mL Topical Once  . cholecalciferol  2,000 Units Oral Once per day on Sun Tue Thu Sat  . darifenacin  15 mg Oral Daily  . enoxaparin (LOVENOX) injection  70 mg Subcutaneous Q24H  . ferrous sulfate  325 mg Oral TID PC  . gabapentin  300 mg Oral TID  . polyethylene glycol  17 g Oral BID  . povidone-iodine  2 application Topical Once   Continuous Infusions: . sodium chloride 50 mL/hr at 12/13/16 1815  . cefTRIAXone (ROCEPHIN)  IV 2 g (12/14/16 1814)  . vancomycin 1,000 mg (12/15/16 0832)     LOS: 3 days    Time spent: 35    Armstead Heiland, Cassie Freer, MD [facility] Pager [phone]  If 7PM-7AM, please contact night-coverage [email or web address] Password  [facility] 12/15/2016, 9:04 AM

## 2016-12-15 NOTE — Progress Notes (Signed)
Heparin to be held today, pt having and I and D in AM.

## 2016-12-15 NOTE — Progress Notes (Signed)
Initial Nutrition Assessment  INTERVENTION:   -Encouraged patient to drink protein shakes from home in between meals (Per patient, provides 36g of protein per 2 scoops) -Encouraged small, frequent meals -Provided brief nutrition education  RD will continue to monitor  NUTRITION DIAGNOSIS:   Increased nutrient needs related to cancer and cancer related treatments as evidenced by estimated needs.  GOAL:   Patient will meet greater than or equal to 90% of their needs  MONITOR:   PO intake, Labs, Weight trends, I & O's  REASON FOR ASSESSMENT:   Consult Assessment of nutrition requirement/status  ASSESSMENT:   61 year old female  61 y.o. female with a history of breast cancer followed at [facility] by Force, Delice Bison, MD, status post most recent chemotherapy on 11/25/16, prior history of DVT (on eliquis which was switched to Lovenox in June 2018)   , left knee arthroplasty in 2009 and then right TKA 09/81/1914 complicated by a fall and underwent 3  incisions and drainage since  November 2018.    Patient in room with husband at bedside. Pt reports good appetite today but she is unable to eat much at one time. PO intakes have ranged from 40-75%. Pt's husband has been bringing patient food from outside the hospital (such as superfood from [place]). Pt states she doesn't care for the options on the menu. Pt tries to drink 1 protein shake (unable to name the brand) that contains 36g protein daily. Encouraged pt to drink protein shakes in between meals so she can consume more of her food. Pt to continue these shakes [facility] at [facility]. Pt denies issues with swallowing or chewing. States she does have taste alterations, such as foods tasting spicy when they are supposed to be bland.   Pt reports she lost 40 lb over the last 10 years. Her weight has remained stable between 155-160 lb. Nutrition-Focused physical exam completed. Findings are no fat depletion, mild muscle depletion, and moderate-severe  edema.   Medications: Ferrous sulfate tablet TID, Miralax packet BID Labs reviewed: Low Na  Diet Order:  Diet Heart Room service appropriate? Yes; Fluid consistency: Thin Diet NPO time specified  Skin:  Reviewed, no issues  Last BM:  7/22  Height:   Ht Readings from Last 1 Encounters:  12/12/16 5\' 7"  (1.702 m)    Weight:   Wt Readings from Last 1 Encounters:  12/12/16 155 lb (70.3 kg)    Ideal Body Weight:  61.4 kg  BMI:  Body mass index is 24.28 kg/m.  Estimated Nutritional Needs:   Kcal:  2100-2300  Protein:  100-110g  Fluid:  2.1L/day  EDUCATION NEEDS:   Education needs addressed  Clayton Bibles, MS, RD, LDN Pager: [phone] After Hours Pager: [phone]

## 2016-12-16 ENCOUNTER — Encounter (HOSPITAL_COMMUNITY): Payer: Self-pay | Admitting: Anesthesiology

## 2016-12-16 ENCOUNTER — Inpatient Hospital Stay (HOSPITAL_COMMUNITY): Payer: 59 | Admitting: Anesthesiology

## 2016-12-16 ENCOUNTER — Inpatient Hospital Stay (HOSPITAL_COMMUNITY): Admission: RE | Admit: 2016-12-16 | Payer: 59 | Source: Ambulatory Visit | Admitting: Orthopedic Surgery

## 2016-12-16 ENCOUNTER — Encounter (HOSPITAL_COMMUNITY)
Admission: EM | Disposition: A | Discharge status: Home / Self Care | Payer: Self-pay | Source: Home / Self Care | Attending: Internal Medicine

## 2016-12-16 DIAGNOSIS — M00861 Arthritis due to other bacteria, right knee: Secondary | ICD-10-CM

## 2016-12-16 HISTORY — PX: IRRIGATION AND DEBRIDEMENT KNEE: SHX5185

## 2016-12-16 LAB — BPAM RBC
BLOOD PRODUCT EXPIRATION DATE: 201808032359
Blood Product Expiration Date: 201808032359
Blood Product Expiration Date: 201808032359
ISSUE DATE / TIME: 201807201727
ISSUE DATE / TIME: 201807201927
UNIT TYPE AND RH: 6200
UNIT TYPE AND RH: 6200
Unit Type and Rh: 6200

## 2016-12-16 LAB — TYPE AND SCREEN
ABO/RH(D): A POS
Antibody Screen: NEGATIVE
UNIT DIVISION: 0
UNIT DIVISION: 0
Unit division: 0

## 2016-12-16 LAB — PREPARE RBC (CROSSMATCH)

## 2016-12-16 LAB — BASIC METABOLIC PANEL
Anion gap: 7 (ref 5–15)
BUN: 13 mg/dL (ref 6–20)
CALCIUM: 8.4 mg/dL — AB (ref 8.9–10.3)
CO2: 27 mmol/L (ref 22–32)
CREATININE: 0.62 mg/dL (ref 0.44–1.00)
Chloride: 100 mmol/L — ABNORMAL LOW (ref 101–111)
GFR calc Af Amer: >60 mL/min (ref >60)
GLUCOSE: 106 mg/dL — AB (ref 65–99)
POTASSIUM: 3.8 mmol/L (ref 3.5–5.1)
SODIUM: 134 mmol/L — AB (ref 135–145)

## 2016-12-16 LAB — CBC
HCT: 28.8 % — ABNORMAL LOW (ref 36.0–46.0)
Hemoglobin: 9.1 g/dL — ABNORMAL LOW (ref 12.0–15.0)
MCH: 30.8 pg (ref 26.0–34.0)
MCHC: 31.6 g/dL (ref 30.0–36.0)
MCV: 97.6 fL (ref 78.0–100.0)
PLATELETS: 215 10*3/uL (ref 150–400)
RBC: 2.95 MIL/uL — AB (ref 3.87–5.11)
RDW: 24.2 % — AB (ref 11.5–15.5)
WBC: 5.2 10*3/uL (ref 4.0–10.5)

## 2016-12-16 LAB — SURGICAL PCR SCREEN
MRSA, PCR: NEGATIVE
Staphylococcus aureus: NEGATIVE

## 2016-12-16 LAB — VANCOMYCIN, TROUGH: VANCOMYCIN TR: 16 ug/mL (ref 15–20)

## 2016-12-16 SURGERY — IRRIGATION AND DEBRIDEMENT KNEE
Anesthesia: General | Site: Knee | Laterality: Right

## 2016-12-16 MED ORDER — SCOPOLAMINE 1 MG/3DAYS TD PT72
MEDICATED_PATCH | TRANSDERMAL | Status: AC
  Administered 2016-12-16: 1.5 mg via TRANSDERMAL
  Filled 2016-12-16: qty 1

## 2016-12-16 MED ORDER — FENTANYL CITRATE (PF) 100 MCG/2ML IJ SOLN
INTRAMUSCULAR | Status: AC
  Filled 2016-12-16: qty 2

## 2016-12-16 MED ORDER — METOCLOPRAMIDE HCL 5 MG PO TABS: 5.0000 mg | ORAL_TABLET | Freq: Three times a day (TID) | ORAL | Status: DC | PRN

## 2016-12-16 MED ORDER — ONDANSETRON HCL 4 MG PO TABS: 4.0000 mg | ORAL_TABLET | Freq: Four times a day (QID) | ORAL | Status: DC | PRN

## 2016-12-16 MED ORDER — PHENOL 1.4 % MT LIQD: 1.0000 | OROMUCOSAL | Status: DC | PRN

## 2016-12-16 MED ORDER — ONDANSETRON HCL 4 MG/2ML IJ SOLN
INTRAMUSCULAR | Status: DC | PRN
  Administered 2016-12-16: 4 mg via INTRAVENOUS

## 2016-12-16 MED ORDER — SCOPOLAMINE 1 MG/3DAYS TD PT72
1.0000 | MEDICATED_PATCH | Freq: Once | TRANSDERMAL | Status: DC
  Administered 2016-12-16: 1.5 mg via TRANSDERMAL

## 2016-12-16 MED ORDER — LIDOCAINE HCL (CARDIAC) 20 MG/ML IV SOLN
INTRAVENOUS | Status: DC | PRN
  Administered 2016-12-16: 100 mg via INTRAVENOUS
  Administered 2016-12-16 (×2): 50 mg via INTRAVENOUS

## 2016-12-16 MED ORDER — VANCOMYCIN HCL IN DEXTROSE 1-5 GM/200ML-% IV SOLN
INTRAVENOUS | Status: AC
  Administered 2016-12-16: 1000 mg via INTRAVENOUS
  Filled 2016-12-16: qty 200

## 2016-12-16 MED ORDER — CHLORHEXIDINE GLUCONATE 4 % EX LIQD: 60.0000 mL | Freq: Once | CUTANEOUS | Status: DC

## 2016-12-16 MED ORDER — METOCLOPRAMIDE HCL 5 MG/ML IJ SOLN: 5.0000 mg | Freq: Three times a day (TID) | INTRAMUSCULAR | Status: DC | PRN

## 2016-12-16 MED ORDER — LIDOCAINE 2% (20 MG/ML) 5 ML SYRINGE
INTRAMUSCULAR | Status: AC
  Filled 2016-12-16: qty 10

## 2016-12-16 MED ORDER — FENTANYL CITRATE (PF) 100 MCG/2ML IJ SOLN
INTRAMUSCULAR | Status: DC | PRN
  Administered 2016-12-16: 25 ug via INTRAVENOUS
  Administered 2016-12-16 (×2): 50 ug via INTRAVENOUS
  Administered 2016-12-16: 25 ug via INTRAVENOUS
  Administered 2016-12-16: 75 ug via INTRAVENOUS
  Administered 2016-12-16 (×3): 50 ug via INTRAVENOUS
  Administered 2016-12-16: 25 ug via INTRAVENOUS

## 2016-12-16 MED ORDER — PROPOFOL 10 MG/ML IV BOLUS
INTRAVENOUS | Status: AC
  Filled 2016-12-16: qty 20

## 2016-12-16 MED ORDER — HYDROMORPHONE HCL-NACL 0.5-0.9 MG/ML-% IV SOSY
PREFILLED_SYRINGE | INTRAVENOUS | Status: AC
  Filled 2016-12-16: qty 2

## 2016-12-16 MED ORDER — CHLORHEXIDINE GLUCONATE 4 % EX LIQD
60.0000 mL | Freq: Once | CUTANEOUS | Status: AC
  Administered 2016-12-16: 4 via TOPICAL
  Filled 2016-12-16: qty 60

## 2016-12-16 MED ORDER — PROMETHAZINE HCL 25 MG/ML IJ SOLN: 6.2500 mg | INTRAMUSCULAR | Status: DC | PRN

## 2016-12-16 MED ORDER — TOBRAMYCIN SULFATE 1.2 G IJ SOLR
INTRAMUSCULAR | Status: DC | PRN
  Administered 2016-12-16: 3.6 g

## 2016-12-16 MED ORDER — SODIUM CHLORIDE 0.9 % IR SOLN
Status: DC | PRN
  Administered 2016-12-16 (×2): 3000 mL

## 2016-12-16 MED ORDER — ONDANSETRON HCL 4 MG/2ML IJ SOLN: 4.0000 mg | Freq: Four times a day (QID) | INTRAMUSCULAR | Status: DC | PRN

## 2016-12-16 MED ORDER — PHENYLEPHRINE HCL 10 MG/ML IJ SOLN
INTRAMUSCULAR | Status: AC
  Filled 2016-12-16: qty 1

## 2016-12-16 MED ORDER — VANCOMYCIN HCL 1000 MG IV SOLR
INTRAVENOUS | Status: DC | PRN
Start: 2016-12-16 — End: 2016-12-16
  Administered 2016-12-16: 3000 mg

## 2016-12-16 MED ORDER — SODIUM CHLORIDE 0.9 % IJ SOLN
INTRAMUSCULAR | Status: AC
  Filled 2016-12-16: qty 10

## 2016-12-16 MED ORDER — PHENYLEPHRINE HCL 10 MG/ML IJ SOLN
INTRAMUSCULAR | Status: DC | PRN
  Administered 2016-12-16: 80 ug via INTRAVENOUS

## 2016-12-16 MED ORDER — VANCOMYCIN HCL IN DEXTROSE 1-5 GM/200ML-% IV SOLN
1000.0000 mg | INTRAVENOUS | Status: AC
  Administered 2016-12-16: 1000 mg via INTRAVENOUS

## 2016-12-16 MED ORDER — HYDROMORPHONE HCL-NACL 0.5-0.9 MG/ML-% IV SOSY
PREFILLED_SYRINGE | INTRAVENOUS | Status: AC
  Filled 2016-12-16: qty 1

## 2016-12-16 MED ORDER — SODIUM CHLORIDE 0.9 % IV SOLN: Freq: Once | INTRAVENOUS | Status: DC

## 2016-12-16 MED ORDER — HYDROMORPHONE HCL-NACL 0.5-0.9 MG/ML-% IV SOSY
0.2500 mg | PREFILLED_SYRINGE | INTRAVENOUS | Status: DC | PRN
  Administered 2016-12-16 (×4): 0.5 mg via INTRAVENOUS

## 2016-12-16 MED ORDER — LACTATED RINGERS IV SOLN: INTRAVENOUS | Status: DC

## 2016-12-16 MED ORDER — DEXAMETHASONE SODIUM PHOSPHATE 10 MG/ML IJ SOLN
INTRAMUSCULAR | Status: DC | PRN
  Administered 2016-12-16: 10 mg via INTRAVENOUS

## 2016-12-16 MED ORDER — DEXTROSE 5 % IV SOLN
INTRAVENOUS | Status: DC | PRN
  Administered 2016-12-16: 20 ug/min via INTRAVENOUS

## 2016-12-16 MED ORDER — MIDAZOLAM HCL 2 MG/2ML IJ SOLN
INTRAMUSCULAR | Status: AC
  Filled 2016-12-16: qty 2

## 2016-12-16 MED ORDER — ENOXAPARIN SODIUM 80 MG/0.8ML ~~LOC~~ SOLN: 70.0000 mg | SUBCUTANEOUS | Status: DC

## 2016-12-16 MED ORDER — PROPOFOL 10 MG/ML IV BOLUS
INTRAVENOUS | Status: DC | PRN
  Administered 2016-12-16: 200 mg via INTRAVENOUS

## 2016-12-16 MED ORDER — MIDAZOLAM HCL 5 MG/5ML IJ SOLN
INTRAMUSCULAR | Status: DC | PRN
  Administered 2016-12-16: 2 mg via INTRAVENOUS

## 2016-12-16 MED ORDER — MENTHOL 3 MG MT LOZG: 1.0000 | LOZENGE | OROMUCOSAL | Status: DC | PRN

## 2016-12-16 SURGICAL SUPPLY — 57 items
ADH SKN CLS APL DERMABOND .7 (GAUZE/BANDAGES/DRESSINGS)
BAG SPEC THK2 15X12 ZIP CLS (MISCELLANEOUS) ×1
BAG ZIPLOCK 12X15 (MISCELLANEOUS) ×2 IMPLANT
BANDAGE ACE 6X5 VEL STRL LF (GAUZE/BANDAGES/DRESSINGS) ×2 IMPLANT
BANDAGE ESMARK 6X9 LF (GAUZE/BANDAGES/DRESSINGS) ×1 IMPLANT
BLADE SAW SGTL 11.0X1.19X90.0M (BLADE) IMPLANT
BNDG CMPR 9X6 STRL LF SNTH (GAUZE/BANDAGES/DRESSINGS) ×1
BNDG ESMARK 6X9 LF (GAUZE/BANDAGES/DRESSINGS) ×2
CEMENT HV SMART SET (Cement) ×3 IMPLANT
COVER SURGICAL LIGHT HANDLE (MISCELLANEOUS) ×2 IMPLANT
CUFF TOURN SGL QUICK 34 (TOURNIQUET CUFF) ×2
CUFF TRNQT CYL 34X4X40X1 (TOURNIQUET CUFF) ×1 IMPLANT
DERMABOND ADVANCED (GAUZE/BANDAGES/DRESSINGS)
DERMABOND ADVANCED .7 DNX12 (GAUZE/BANDAGES/DRESSINGS) ×1 IMPLANT
DRAPE EXTREMITY T 121X128X90 (DRAPE) ×2 IMPLANT
DRSG ADAPTIC 3X8 NADH LF (GAUZE/BANDAGES/DRESSINGS) ×2 IMPLANT
DRSG AQUACEL AG ADV 3.5X10 (GAUZE/BANDAGES/DRESSINGS) IMPLANT
DRSG PAD ABDOMINAL 8X10 ST (GAUZE/BANDAGES/DRESSINGS) ×2 IMPLANT
DRSG TEGADERM 4X4.75 (GAUZE/BANDAGES/DRESSINGS) ×2 IMPLANT
DRSG VAC ATS SM SENSATRAC (GAUZE/BANDAGES/DRESSINGS) ×1 IMPLANT
DURAPREP 26ML APPLICATOR (WOUND CARE) ×2 IMPLANT
ELECT REM PT RETURN 15FT ADLT (MISCELLANEOUS) ×2 IMPLANT
EVACUATOR 1/8 PVC DRAIN (DRAIN) ×2 IMPLANT
GAUZE SPONGE 2X2 8PLY STRL LF (GAUZE/BANDAGES/DRESSINGS) ×1 IMPLANT
GAUZE SPONGE 4X4 12PLY STRL (GAUZE/BANDAGES/DRESSINGS) ×2 IMPLANT
GLOVE BIOGEL M 7.0 STRL (GLOVE) IMPLANT
GLOVE BIOGEL PI IND STRL 7.5 (GLOVE) ×1 IMPLANT
GLOVE BIOGEL PI IND STRL 8.5 (GLOVE) ×1 IMPLANT
GLOVE BIOGEL PI INDICATOR 7.5 (GLOVE) ×2
GLOVE BIOGEL PI INDICATOR 8.5 (GLOVE) ×1
GLOVE ECLIPSE 8.0 STRL XLNG CF (GLOVE) IMPLANT
GLOVE ORTHO TXT STRL SZ7.5 (GLOVE) ×4 IMPLANT
GLOVE SURG ORTHO 8.0 STRL STRW (GLOVE) ×2 IMPLANT
GOWN STRL REUS W/TWL LRG LVL3 (GOWN DISPOSABLE) ×2 IMPLANT
GOWN STRL REUS W/TWL XL LVL3 (GOWN DISPOSABLE) ×4 IMPLANT
HANDPIECE INTERPULSE COAX TIP (DISPOSABLE) ×2
IMMOBILIZER KNEE 20 (SOFTGOODS) ×2
IMMOBILIZER KNEE 20 THIGH 36 (SOFTGOODS) IMPLANT
KIT BASIN OR (CUSTOM PROCEDURE TRAY) ×2 IMPLANT
MANIFOLD NEPTUNE II (INSTRUMENTS) ×2 IMPLANT
PACK TOTAL JOINT (CUSTOM PROCEDURE TRAY) ×2 IMPLANT
PADDING CAST COTTON 6X4 STRL (CAST SUPPLIES) ×2 IMPLANT
POSITIONER SURGICAL ARM (MISCELLANEOUS) ×2 IMPLANT
SET HNDPC FAN SPRY TIP SCT (DISPOSABLE) ×1 IMPLANT
SPACER KASM MOLD44APX67ML KNEE (Spacer) ×1 IMPLANT
SPACER TIBIAL SM39*58 KNEE (Spacer) ×1 IMPLANT
SPONGE GAUZE 2X2 STER 10/PKG (GAUZE/BANDAGES/DRESSINGS) ×1
STAPLER VISISTAT 35W (STAPLE) ×2 IMPLANT
SUT MNCRL AB 4-0 PS2 18 (SUTURE) ×2 IMPLANT
SUT PDS AB 1 CT1 27 (SUTURE) ×3 IMPLANT
SUT VIC AB 1 CT1 36 (SUTURE) ×4 IMPLANT
SUT VIC AB 2-0 CT1 27 (SUTURE) ×6
SUT VIC AB 2-0 CT1 TAPERPNT 27 (SUTURE) ×3 IMPLANT
SUT VLOC 180 0 24IN GS25 (SUTURE) ×2 IMPLANT
SWAB COLLECTION DEVICE MRSA (MISCELLANEOUS) ×2 IMPLANT
SWAB CULTURE ESWAB REG 1ML (MISCELLANEOUS) ×1 IMPLANT
TOWEL OR 17X26 10 PK STRL BLUE (TOWEL DISPOSABLE) ×4 IMPLANT

## 2016-12-16 NOTE — Progress Notes (Signed)
Pharmacy Antibiotic Note  [name] is a 61 y.o. female admitted on 12/12/2016 with infected right TKA, s/p excisional R TKA with abx spacers on 7/10. Pharmacy was consulted for vancomycin dosing. Patient was recently discharged on 7/13 with plan to continue Vancomycin and Rocephin until 01/13/17 per ID recommendations. Also note that patient has been following with ID clinic for some time and has been a number of antibiotics. ID recommended to continue vancomycin and ceftriaxone.    Now currently on D#15/42 of antibiotics  Vancomycin trough 16 this morning on 1g q12h dosing   Plan: Continue Vancomycin 1gram IV q12h Continue ceftriaxone 2 grams IV q24h Follow renal function, cultures, clinical course   Height: 5\' 7"  (170.2 cm) Weight: 155 lb (70.3 kg) IBW/kg (Calculated) : 61.6  Temp (24hrs), Avg:100.3 F (37.9 C), Min:99.8 F (37.7 C), Max:100.7 F (38.2 C)   Recent Labs Lab 12/12/16 1325 12/13/16 0549 12/13/16 0920 12/14/16 0515 12/15/16 0437 12/16/16 0404  WBC 6.2 6.5  --  6.1 6.3 5.2  CREATININE 0.62 0.63  --  0.63 0.63 0.62  VANCOTROUGH  --   --  18  --   --   --     Estimated Creatinine Clearance: 72.7 mL/min (by C-G formula based on SCr of 0.62 mg/dL).    Allergies  Allergen Reactions  . Penicillins Hives, Itching and Rash    Has patient had a PCN reaction causing immediate rash, facial/tongue/throat swelling, SOB or lightheadedness with hypotension: no Has patient had a PCN reaction causing severe rash involving mucus membranes or skin necrosis: No Has patient had a PCN reaction that required hospitalization No Has patient had a PCN reaction occurring within the last 10 years: yes If all of the above answers are "NO", then may proceed with Cephalosporin use.   Denies airway involvement   . Other Rash    STERI STRIPS - Blisters  . Tape Hives    Can tolerate paper tape    Thank you for allowing pharmacy to be a part of this patient's care.  Clayburn Pert, PharmD, BCPS Pager: [phone] 12/16/2016  8:50 AM

## 2016-12-16 NOTE — Anesthesia Postprocedure Evaluation (Signed)
Anesthesia Post Note  Patient: PATI THINNES  Procedure(s) Performed: Procedure(s) (LRB): Repeat irrigation and debridement right knee, wound closure  wound vac REPLACEMENT ANTIBIOTIC SPACERS (Right)     Patient location during evaluation: PACU Anesthesia Type: General Level of consciousness: awake and alert Pain management: pain level controlled Vital Signs Assessment: post-procedure vital signs reviewed and stable Respiratory status: spontaneous breathing, nonlabored ventilation, respiratory function stable and patient connected to nasal cannula oxygen Cardiovascular status: blood pressure returned to baseline and stable Postop Assessment: no signs of nausea or vomiting Anesthetic complications: no    Last Vitals:  Vitals:   12/16/16 1530 12/16/16 1546  BP: 121/65 124/69  Pulse: 90 92  Resp: 15 15  Temp: 36.8 C 36.8 C    Last Pain:  Vitals:   12/16/16 1611  TempSrc:   PainSc: 9                  Arkie Tagliaferro P Kimley Apsey

## 2016-12-16 NOTE — Transfer of Care (Signed)
Immediate Anesthesia Transfer of Care Note  Patient: [name]  Procedure(s) Performed: Procedure(s): Repeat irrigation and debridement right knee, wound closure  wound vac REPLACEMENT ANTIBIOTIC SPACERS (Right)  Patient Location: PACU  Anesthesia Type:Spinal  Level of Consciousness:  sedated, patient cooperative and responds to stimulation  Airway & Oxygen Therapy:Patient Spontanous Breathing and Patient connected to face mask oxgen  Post-op Assessment:  Report given to PACU RN and Post -op Vital signs reviewed and stable  Post vital signs:  Reviewed and stable  Last Vitals:  Vitals:   12/16/16 0531 12/16/16 1355  BP: (!) 142/64 120/76  Pulse: (!) 110   Resp: 18   Temp: (!) 45.1 C     Complications: No apparent anesthesia complications

## 2016-12-16 NOTE — Brief Op Note (Signed)
12/12/2016 - 12/16/2016  9:56 PM  PATIENT:  [name]  61 y.o. 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  PROCEDURE:  Procedure(s): Repeat irrigation and debridement right knee, wound closure  wound vac REPLACEMENT ANTIBIOTIC SPACERS (Right)  SURGEON:  Surgeon(s) and Role:    Paralee Cancel, MD - Primary  PHYSICIAN ASSISTANT: Danae Orleans, PA-C  ANESTHESIA:   spinal  EBL:  Total I/O In: -  Out: 720 [Urine:700; Drains:20]  BLOOD ADMINISTERED:1 unit PRBCs   DRAINS: (1 medium) Hemovact drain(s) in the right knee intra-articularly with  Suction Open   LOCAL MEDICATIONS USED:  NONE  SPECIMEN:  No Specimen  DISPOSITION OF SPECIMEN:  N/A  COUNTS:  YES  TOURNIQUET:   Total Tourniquet Time Documented: Thigh (Right) - 33 minutes Total: Thigh (Right) - 33 minutes   DICTATION: .Other Dictation: Dictation Number [id number]  PLAN OF CARE: Admit to inpatient   PATIENT DISPOSITION:  PACU - hemodynamically stable.   Delay start of Pharmacological VTE agent (>24hrs) due to surgical blood loss or risk of bleeding: yes

## 2016-12-16 NOTE — Op Note (Signed)
[name], [name] NO.:  [id number]  MEDICAL RECORD NO.:  [id number]  LOCATION:  [place]                         FACILITY:  [facility]  PHYSICIAN:  Pietro Cassis. Alvan Dame, M.D.  DATE OF BIRTH:  Dec 11, 1955  DATE OF PROCEDURE:  12/16/2016 DATE OF DISCHARGE:                              OPERATIVE REPORT   POSTOPERATIVE DIAGNOSES:  Infected right total knee replacement, status post resection of implants, placement of antibiotic spacer with persistent wound drainage and anemia.  POSTOPERATIVE DIAGNOSES:  Infected right total knee replacement, status post resection of implants, placement of antibiotic spacer with persistent wound drainage and anemia.  PROCEDURE:  Repeat excisional and nonexcisional debridement of right knee with replacement and revision of her articulating antibiotic spacer.  This was using the DePuy KASM system with a medium femur and a small tibia that measured about 15 mm.  SURGEON:  Pietro Cassis. Alvan Dame, M.D.  ASSISTANT:  Danae Orleans, PA-C.  Note that Mr. [name] was present for the entirety of the case from preoperative position, perioperative management of the operative extremity, general facilitation of the case and primary wound closure.  ANESTHESIA:  Spinal.  SPECIMENS:  None taken at this point.  FINDINGS:  See body.  DRAINS:  I did use one medium Hemovac in the deep space based on her significant blood accumulation intra-articularly as well as a small wound VAC in the anterior aspect of the knee as described below.  TOURNIQUET TIME:  35 minutes at 250 mmHg.  COMPLICATIONS:  None apparent.  INDICATION FOR THE PROCEDURE:  [name] is a 61 year old female with history of knee replacement complicated by fall and wound dehiscence. This required multiple interventions to try to salvage the joint, but ultimately led to resection of her joint by myself about 10 days ago.  After presented to the office initially for followup evaluation, she  was determined to be fairly anemic and was subsequently admitted to the hospital on the 20th.  She has received the units of blood and then stabilized.  Unfortunately, she has been determined to need some high dose of anticoagulation based on the ongoing treatment for cancer in the ports. Apparently, she had a clot in the area of this port, but no pulmonary embolism, no deep vein lower extremity thrombosis to report. Nonetheless, she has resulted in significant drop in hemoglobin and hematoma formation within the knees.  I felt it was in her best interest to once again back to operating room and repeat I and D of the knee to better evaluate only the anteroposterior aspect of the knee, but also the skin, which has been deemed to be the most important at this point.  Consent was obtained for above procedures and goals.  PROCEDURE IN DETAIL:  The patient was brought to the operative theater. Once adequate anesthesia, preoperative antibiotics had already been administered, which she is currently on vancomycin and ceftriaxone.  Her right lower extremity was then prepped and draped in sterile fashion. DeMayo leg holder was utilized.  Time-out was performed identifying the patient, planned procedure and extremity.  The right lower extremity was then exsanguinated, tourniquet elevated to 250 mmHg.  Due to the recent  proximity to her time wise into her recent surgery, her staples and sutures were now removed.  At this point, I opened up her old incision identifying significant subcutaneous hematoma.  This was evacuated and then irrigated.  I then recreated the medial arthrotomy encountering the significant amount of hematoma inside the joint itself, which was again evacuated.  Following irrigation for exposure purposes, I removed the old previously-placed femoral and tibial cemented articulating spacer.  Once this was done, I further debrided the soft tissue bone.  We then irrigated the  knee with about 4 L of normal saline solution with pulse lavage.  While this was going on, the new antibiotic spacers were made, each batch of cement mixed with 1 g of vancomycin and 1.2 of tobramycin. The femoral component was made and then held off to the end of the distal femur while the cement fully cured.  Once the cement had cured, a separate batch of cement was made again with tobramycin and vancomycin. The tibial component was then cemented into place.  The knee was brought to extension until the cement fully cured.  At this point, the knee was re-irrigated with normal saline solution to finish of about 6 liters of irrigation, let the tourniquet down after 35 minutes.  There was a significant amount of just punctate oozing from her wound.  I did place a medium Hemovac drain deep into this area. Based on her the appearance of her skin and some preoperative consultation with Plastic Surgery, I ended up excising some of the less viable-appearing skin and then used a small wound VAC in the skin.  The skin edges that were viable closed with 2-0 Vicryl and staples.  This wound VAC was placed in the central portion distally.  The wound VAC was hooked to suction with good seal and Hemovac draining hookup.  At this point, we wrapped the knee in a sterile bulky Jones dressing, transferred to the recovery room in stable condition.  Postoperatively, we will need to keep a close eye on her wound.  I then reviewed with her family, this will not be her last operation as we moved through the process, trying to get her joint to heal and to determine when we would be able to put in a new knee replacement.  Findings were again reviewed with her family.  She will be admitted to the hospital, continue on IV antibiotics and followed accordingly.     Pietro Cassis Alvan Dame, M.D.     MDO/MEDQ  D:  12/16/2016  T:  12/16/2016  Job:  [id number]

## 2016-12-16 NOTE — Anesthesia Procedure Notes (Signed)
Procedure Name: LMA Insertion Date/Time: 12/16/2016 12:27 PM Performed by: Edman Lipsey, Virgel Gess Pre-anesthesia Checklist: Patient identified, Emergency Drugs available, Suction available and Patient being monitored Patient Re-evaluated:Patient Re-evaluated prior to induction Oxygen Delivery Method: Circle System Utilized Preoxygenation: Pre-oxygenation with 100% oxygen Induction Type: IV induction Ventilation: Mask ventilation without difficulty LMA: LMA inserted LMA Size: 4.0 Number of attempts: 1 Airway Equipment and Method: Bite block Placement Confirmation: positive ETCO2 Tube secured with: Tape Dental Injury: Teeth and Oropharynx as per pre-operative assessment  Comments: Bite block.

## 2016-12-16 NOTE — Anesthesia Preprocedure Evaluation (Addendum)
Anesthesia Evaluation  Patient identified by MRN, date of birth, ID band Patient awake    Reviewed: Allergy & Precautions, NPO status , Patient's Chart, lab work & pertinent test results  History of Anesthesia Complications (+) PONV and history of anesthetic complications  Airway Mallampati: II  TM Distance: >3 FB Neck ROM: Full    Dental no notable dental hx.    Pulmonary neg pulmonary ROS,    Pulmonary exam normal breath sounds clear to auscultation       Cardiovascular negative cardio ROS Normal cardiovascular exam Rhythm:Regular Rate:Normal  ECG: ST, rate 103  ECHO: Left ventricle: The cavity size was normal. Systolic function was normal. The estimated ejection fraction was in the range of 60% to 65%. Wall motion was normal; there were no regional wall motion abnormalities. Left ventricular diastolic function parameters were normal. Atrial septum: A patent foramen ovale cannot be excluded. Impressions: No strain imaging done   Neuro/Psych negative neurological ROS  negative psych ROS   GI/Hepatic negative GI ROS, Neg liver ROS,   Endo/Other  negative endocrine ROS  Renal/GU negative Renal ROS     Musculoskeletal  (+) Arthritis , Osteoarthritis,    Abdominal   Peds  Hematology  (+) anemia ,   Anesthesia Other Findings Breast cancer on chemo   Reproductive/Obstetrics                            Anesthesia Physical  Anesthesia Plan  ASA: III  Anesthesia Plan: General   Post-op Pain Management:  Regional for Post-op pain   Induction: Intravenous  PONV Risk Score and Plan: 4 or greater and Ondansetron, Dexamethasone, Propofol, Midazolam and Scopolamine patch - Pre-op  Airway Management Planned: LMA  Additional Equipment:   Intra-op Plan:   Post-operative Plan: Extubation in OR  Informed Consent: I have reviewed the patients History and Physical, chart, labs and discussed  the procedure including the risks, benefits and alternatives for the proposed anesthesia with the patient or authorized representative who has indicated his/her understanding and acceptance.   Dental advisory given  Plan Discussed with: CRNA and Surgeon  Anesthesia Plan Comments:         Anesthesia Quick Evaluation

## 2016-12-16 NOTE — Consult Note (Signed)
   [facility] CM Inpatient Consult   12/16/2016  CHLOE MIYOSHI 09/03/1955 [id number]   Chart reviewed on behalf of [facility] to [facility] Care Management program for [employer] employees/dependents with [facility] insurance.   Currently of unit for procedure. Will follow up at later time.   Known to Probation officer from previous hospitalization.   Will receive post hospital discharge call.  Marthenia Rolling, MSN-Ed, RN,BSN [facility] Liaison [phone]

## 2016-12-16 NOTE — Progress Notes (Signed)
Patient ID: [name], female   DOB: [birth year], 61 y.o.   MRN: [id number] Subjective: Day of Surgery Procedure(s) (LRB): Repeat irrigation and debridement right knee, wound closure versus wound vac (Right)    Patient reports pain as mild to moderate.  Ready for today's procedure.  Low grad temps reported  Objective:   VITALS:   Vitals:   12/15/16 2100 12/16/16 0531  BP: 136/74 (!) 142/64  Pulse: (!) 112 (!) 110  Resp: 18 18  Temp: 99.8 F (37.7 C) (!) 100.7 F (38.2 C)    Neurovascular intact  Incision to be more fully examined in the OR this pm (see operative notes for findings)  LABS  Recent Labs  12/14/16 0515 12/15/16 0437 12/16/16 0404  HGB 8.9* 9.0* 9.1*  HCT 28.0* 28.1* 28.8*  WBC 6.1 6.3 5.2  PLT 223 222 215     Recent Labs  12/14/16 0515 12/15/16 0437 12/16/16 0404  NA 138 132* 134*  K 3.8 3.8 3.8  BUN 12 16 13   CREATININE 0.63 0.63 0.62  GLUCOSE 101* 106* 106*    No results for input(s): LABPT, INR in the last 72 hours.   Assessment/Plan: Day of Surgery Procedure(s) (LRB): Repeat irrigation and debridement right knee, wound closure versus wound vac (Right)  NPO Consent obtained On Antibiotics per ID I have asked [facility] to evaluate wound for their thoughts, they will continue to follow based on OR findings

## 2016-12-16 NOTE — Progress Notes (Signed)
PROGRESS NOTE    [name]  DEY:[id number] DOB: Jun 01, 1955 DOA: 12/12/2016 PCP: Midge Minium, MD    Brief Narrative:  61 year old female 61 y.o.femalewith a history of breast cancer followed at [facility] by Force, Delice Bison, MD, status post most recent chemotherapy on 11/25/16, prior history of DVT (on eliquis which was switched to Lovenox in June 2018)  , left knee arthroplasty in 2009 and then right TKA 31/49/7026 complicated by a fall and underwent 3  incisions and drainage since  November 2018. She took cephalexin after that but had persistent drainage with cellulitis and treated with a short course of vancomycin + aztreonam and transitioned to cipro for possible UTI but continued drainage and readmitted in January 2018. She underwent I and D and poly-exchange and VAC placed and joint cultures grew Enterococcus faecalis and completed 6 weeks of IV vancomcyin and transitioned to oral amoxicillin after an allergy test for penicillin but developed hives. She then transitioned to oral linezolid and took about 6 weeks but stopped on her own with concern for side effects. She did require surgery on her wound by Dr. Baltazar Apo in March and had done well but developed a hematoma after bumping her knee in June and was debrided and grew an anaerob and was put on metronidazole which she took until 3 weeks ago.  she was readmitted in July and underwent resection of right total knee arthroplasty with placement of antibiotic spacer by Dr. Alvan Dame.   She was discharged on 12/05/16 with IV vancomycin and Rocephin to be completed through 01/13/17 through home health. Patient has only been receiving IV vancomycin since her discharge on 7/13, family and Dr Alvan Dame do not know why . Dr. Alvan Dame has been following her hemoglobin and a hemoglobin drop from 7-5.4 on blood work yesterday. Patient was advised to come to the ED. She was advised to hold Lovenox and she has not taken any in the last 24 hours.  Assessment &  Plan:   Principal Problem:   Acute blood loss anemia Active Problems:   Metastatic breast cancer (HCC)   Anxiety, generalized   Postoperative anemia due to acute blood loss   S/P total knee arthroplasty, right   Right knee abx spacer   Pyogenic arthritis of right knee joint (HCC)  1-Acute blood loss anemia; multifactorial  Combination of acute blood loss anemia secondary to right knee hematoma,, anemia of chronic disease Patient gets Kadcyla, which doesn't cause anemia.  S/P 2 units PRBC.  Hb at 9 Plan to hold lovenox prior to procedure.  haptoglobin pending , LDH 300. retic count.  Hb stable.   History of DVT associated with Port-A-Cath-continue Lovenox as above  Plan to hold Lovenox today prior to procedure.  Resume when ok by ortho   Prosthetic Joint infection, hematoma;  Report draining pus at home.  plan for I and D on Tuesday.  Continue with IV vancomycin and ceftriaxone.  Ortho following.  Nutrition consultation  Plan for I and D today.   Mild hyponatremia; continue with  IV fluids.      DVT prophylaxis: on lovenox Code Status: full code.  Family Communication: (husband and daughter Disposition Plan: to be determine.  Consultants:   ID  Ortho   Procedures: none   Antimicrobials:   Vancomycin  Ceftriaxone.    Subjective: She denies chest pain, dyspnea.   Objective: Vitals:   12/16/16 1500 12/16/16 1510 12/16/16 1512 12/16/16 1527  BP: 131/72  122/67 121/65  Pulse: 95 88 90  93  Resp: (!) 22 16 14 16   Temp:  98.1 F (36.7 C) 98.1 F (36.7 C) 98.3 F (36.8 C)  TempSrc:  Oral Oral Oral  SpO2: 96% 95% 95% 96%  Weight:      Height:        Intake/Output Summary (Last 24 hours) at 12/16/16 1534 Last data filed at 12/16/16 1527  Gross per 24 hour  Intake          2373.33 ml  Output              450 ml  Net          1923.33 ml   Filed Weights   12/12/16 1232  Weight: 70.3 kg (155 lb)    Examination:  General exam:  NAD Respiratory system:  CTA Cardiovascular system; S 1, S 2 RRR Gastrointestinal system: BS present, soft, nt Central nervous system: Non focal.   Extremities: left 5 x 5 power. Right knee with dressing.  Psychiatry: Judgement and insight appear normal. Mood & affect appropriate.     Data Reviewed: I have personally reviewed following labs and imaging studies  CBC:  Recent Labs Lab 12/12/16 1325 12/13/16 0549 12/13/16 2105 12/14/16 0515 12/15/16 0437 12/16/16 0404  WBC 6.2 6.5  --  6.1 6.3 5.2  NEUTROABS 4.7  --   --   --   --   --   HGB 6.1* 8.9* 8.4* 8.9* 9.0* 9.1*  HCT 19.2* 27.5* 26.3* 28.0* 28.1* 28.8*  MCV 96.0 92.0  --  95.2 96.2 97.6  PLT 231 232  --  223 222 824   Basic Metabolic Panel:  Recent Labs Lab 12/12/16 1325 12/13/16 0549 12/14/16 0515 12/15/16 0437 12/16/16 0404  NA 135 134* 138 132* 134*  K 3.6 3.5 3.8 3.8 3.8  CL 99* 97* 102 99* 100*  CO2 28 27 28 27 27   GLUCOSE 99 101* 101* 106* 106*  BUN 13 8 12 16 13   CREATININE 0.62 0.63 0.63 0.63 0.62  CALCIUM 8.5* 8.3* 8.7* 8.5* 8.4*  MG 1.8  --   --   --   --    GFR: Estimated Creatinine Clearance: 72.7 mL/min (by C-G formula based on SCr of 0.62 mg/dL). Liver Function Tests:  Recent Labs Lab 12/12/16 1325 12/13/16 0549 12/13/16 1300 12/15/16 0437  AST 46* 46*  --  43*  ALT 24 23  --  20  ALKPHOS 121 127*  --  113  BILITOT 1.7* 2.2* 1.6* 1.5*  PROT 6.7 6.7  --  6.8  ALBUMIN 2.8* 2.7*  --  2.7*   No results for input(s): LIPASE, AMYLASE in the last 168 hours. No results for input(s): AMMONIA in the last 168 hours. Coagulation Profile:  Recent Labs Lab 12/12/16 1325  INR 1.08   Cardiac Enzymes: No results for input(s): CKTOTAL, CKMB, CKMBINDEX, TROPONINI in the last 168 hours. BNP (last 3 results) No results for input(s): PROBNP in the last 8760 hours. HbA1C: No results for input(s): HGBA1C in the last 72 hours. CBG: No results for input(s): GLUCAP in the last 168  hours. Lipid Profile: No results for input(s): CHOL, HDL, LDLCALC, TRIG, CHOLHDL, LDLDIRECT in the last 72 hours. Thyroid Function Tests: No results for input(s): TSH, T4TOTAL, FREET4, T3FREE, THYROIDAB in the last 72 hours. Anemia Panel: No results for input(s): VITAMINB12, FOLATE, FERRITIN, TIBC, IRON, RETICCTPCT in the last 72 hours. Sepsis Labs: No results for input(s): PROCALCITON, LATICACIDVEN in the last 168 hours.  Recent Results (  from the past 240 hour(s))  Culture, blood (routine x 2)     Status: None (Preliminary result)   Collection Time: 12/12/16  3:49 PM  Result Value Ref Range Status   Specimen Description BLOOD PORT  Final   Special Requests   Final    BOTTLES DRAWN AEROBIC AND ANAEROBIC Blood Culture adequate volume   Culture   Final    NO GROWTH 4 DAYS Performed at [facility], [address], [place], [place] [address]    Report Status PENDING  Incomplete  Culture, blood (routine x 2)     Status: None (Preliminary result)   Collection Time: 12/12/16  3:54 PM  Result Value Ref Range Status   Specimen Description BLOOD BLOOD RIGHT ARM  Final   Special Requests   Final    BOTTLES DRAWN AEROBIC AND ANAEROBIC Blood Culture adequate volume   Culture   Final    NO GROWTH 4 DAYS Performed at [facility], [place] [address] [place], [place] [address]    Report Status PENDING  Incomplete  Surgical pcr screen     Status: None   Collection Time: 12/16/16  5:00 AM  Result Value Ref Range Status   MRSA, PCR NEGATIVE NEGATIVE Final   Staphylococcus aureus NEGATIVE NEGATIVE Final    Comment:        The Xpert SA Assay (FDA approved for NASAL specimens in patients over 55 years of age), is one component of a comprehensive surveillance program.  Test performance has been validated by [facility] for patients greater than or equal to 62 year old. It is not intended to diagnose infection nor to guide or monitor treatment.          Radiology  Studies: No results found.      Scheduled Meds: . chlorhexidine  60 mL Topical Once  . [MAR Hold] cholecalciferol  2,000 Units Oral Once per day on Sun Tue Thu Sat  . [MAR Hold] darifenacin  15 mg Oral Daily  . [MAR Hold] ferrous sulfate  325 mg Oral TID PC  . [MAR Hold] gabapentin  300 mg Oral TID  . HYDROmorphone      . HYDROmorphone      . HYDROmorphone      . [MAR Hold] polyethylene glycol  17 g Oral BID  . povidone-iodine  2 application Topical Once  . scopolamine  1 patch Transdermal Once   Continuous Infusions: . sodium chloride 1,000 mL (12/15/16 1303)  . sodium chloride    . [MAR Hold] cefTRIAXone (ROCEPHIN)  IV Stopped (12/15/16 1810)  . lactated ringers    . [MAR Hold] vancomycin Stopped (12/15/16 2301)     LOS: 4 days    Time spent: 19    Regalado, Cassie Freer, MD [facility] Pager [phone]  If 7PM-7AM, please contact night-coverage [email or web address] Password [facility] 12/16/2016, 3:34 PM

## 2016-12-16 NOTE — Consult Note (Signed)
Reason for Consult:Wound of right knee Referring Physician: Dr. Luna Fuse, orthopedics Inpatient consult 12/15/16 [facility]  [name] is an 62 y.o. female.  HPI: [name] is a 61 yo female who was admitted with right knee swelling, recurrence of wound and drainage and fevers. She fell onto her right knee following her right TKA per Dr. Shelle Iron and required several I&D's and VAC placement for residual wound. We evaluated the patient and she underwent placement of Cellerate collagen and subsequently healed the wound. 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 She has undergone resection of the right TKA with placement of antibiotic spacer. Cultures grew E Faecalis. She is on IV VANC and Rocephin per ID.  She is anemic and has required transfusion. Hgb just prior to admit had dropped to 5.4.  She has a history of breast cancer with mets to the chest wall and is treated at [facility] with ongoing chemotherapy, Kadcyla ( last cycle 11/25/16). She has a history of DVT and is anticoagulated on Lovenox currently. Previously on Eliquis and was switched to Lovenox in June 2018.  We are asked to evaluate for her wound of the right knee for current and long term management.  Dr. Charlann Boxer plans repeat I&D in OR tomorrow.   Past Medical History:  Diagnosis Date  . 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 Allergic reaction 07/24/2016  . Arthritis KNEES   right knee, hx. past left knee replacemnt.  . Blood clot in vein    around Portacath right chest-tx. Eliquis about a yr., prior Lovenox.  . Blood dyscrasia   . Chronic anticoagulation HX  PORT-A-CATH CLOT   2010 - HAS BEEN ON LOVENOX SINCE THE CLOT  . Drug rash 07/24/2016  . Elevated blood pressure reading without diagnosis of hypertension    PT MONITORS HER B/P AT HOME  . Heart murmur   . 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 PONV (postoperative nausea and vomiting)    ponv likes scopolamine patch  . Skin cancer of anterior chest BREAST CANCER PRIMARY W/ METS TO CHEST WALL SKIN CANCER--  CHEMOTHERAPY EVERY 3 WEEKS AT [facility]   left 9 yrs ago, chemo every 3 weeks at [facility], last chemo july 3rd  . Status post skin flap graft    right chest wall with metastatis right anterior chest -no drainage or open wound.    Past Surgical History:  Procedure Laterality Date  . APPLICATION OF A-CELL OF BACK Right 07/31/2016   Procedure: CELLERATE COLLAGEN PLACEMENT;  Surgeon: Alena Bills Dillingham, DO;  Location: [facility] OR;  Service: Plastics;  Laterality: Right;  . CHEST WALL TUMOR EXCISION     right  . EXCISIONAL TOTAL KNEE ARTHROPLASTY WITH ANTIBIOTIC SPACERS Right 12/02/2016   Procedure: EXCISIONAL TOTAL KNEE ARTHROPLASTY WITH ANTIBIOTIC SPACERS;  Surgeon: Durene Romans, MD;  Location: [facility];  Service: Orthopedics;  Laterality: Right;  90 mins  . hemotoma     evacuation left chest wall  . I&D KNEE WITH POLY EXCHANGE Right 05/28/2016   Procedure: IRRIGATION AND DEBRIDEMENT KNEE WOUND VAC PLACMENT;  Surgeon: Jene Every, MD;  Location: [facility];  Service: Orthopedics;  Laterality: Right;  . I&D KNEE WITH POLY EXCHANGE Right 05/30/2016   Procedure: RADICAL SYNOVECTOMY,IRRIGATION  AND DEBRIDEMENT KNEE WITH POLY EXCHANGE WITH ANTIBIOTIC BEADS, APPLICATION OF WOUND VAC;  Surgeon: Susa Day, MD;  Location: [facility];  Service: Orthopedics;  Laterality: Right;  . INCISION AND DRAINAGE OF WOUND Right 07/31/2016   Procedure: IRRIGATION AND DEBRIDEMENT RIGHT KNEE  WOUND;  Surgeon: Loel Lofty Dillingham, DO;  Location: [facility];  Service: Plastics;  Laterality: Right;  . IRRIGATION AND DEBRIDEMENT KNEE Right 04/12/2016   Procedure: IRRIGATION AND DEBRIDEMENT KNEE;  Surgeon: Nicholes Stairs, MD;  Location: [facility];  Service: Orthopedics;  Laterality:  Right;  . KNEE ARTHROSCOPY  02/13/2012   Procedure: ARTHROSCOPY KNEE;  Surgeon: Johnn Hai, MD;  Location: [facility];  Service: Orthopedics;  Laterality: Left;  WITH DEBRIDEMENt   . KNEE ARTHROSCOPY WITH LATERAL MENISECTOMY  02/13/2012   Procedure: KNEE ARTHROSCOPY WITH LATERAL MENISECTOMY;  Surgeon: Johnn Hai, MD;  Location: [facility];  Service: Orthopedics;;  partial  . LEFT MODIFIED RADICAL MASTECTOMY/ RIGHT TOTAL MASTECTOMY  11-13-2007   LEFT BREAST CANCER W/ AXILLARY LYMPH NODE METASTASIS AND POST NEOADJUVANT CHEMO  . PLACEMENT PORT-A-CATH  06/22/2007   right chest new port placed 2015, right chest  . SKIN GRAFT     chest-post tumor removal, second occurrence of cancer" 2015 surgery for Flap 2015"  . TOTAL KNEE ARTHROPLASTY Left 09/23/2012   Procedure: LEFT TOTAL KNEE ARTHROPLASTY;  Surgeon: Johnn Hai, MD;  Location: [facility];  Service: Orthopedics;  Laterality: Left;  . TOTAL KNEE ARTHROPLASTY Right 03/20/2016   Procedure: RIGHT TOTAL KNEE ARTHROPLASTY;  Surgeon: Susa Day, MD;  Location: [facility];  Service: Orthopedics;  Laterality: Right;  . TRANSTHORACIC ECHOCARDIOGRAM  11-18-2011  DR BENSIMHON (ECHO EVERY 3 MONTHS)   HX CHEMO INDUCED CARDIOTOXICITY/  LVSF NORMAL/ EF 55-50%/ MILD MITRIAL VALVE REGURG./ MILDLY INCREASED SYSTOLIC PRESSURE OF PULMONARY ARTERIES  . VASCULAR SURGERY Right    portacath chest    Family History  Problem Relation Age of Onset  . Heart disease Mother        due to mitral valve regurgiation,   . Cancer Mother        breast  . Allergic rhinitis Mother   . Parkinsonism Father   . Allergic rhinitis Father   . Migraines Father   . Alcohol abuse Paternal Grandfather   . Angioedema Neg Hx   . Asthma Neg Hx   . Eczema Neg Hx     Social History:  reports that she has never smoked. She has never used smokeless tobacco. She reports that she does not drink alcohol or use drugs.  Allergies:  Allergies   Allergen Reactions  . Penicillins Hives, Itching and Rash    Has patient had a PCN reaction causing immediate rash, facial/tongue/throat swelling, SOB or lightheadedness with hypotension: no Has patient had a PCN reaction causing severe rash involving mucus membranes or skin necrosis: No Has patient had a PCN reaction that required hospitalization No Has patient had a PCN reaction occurring within the last 10 years: yes If all of the above answers are "NO", then may proceed with Cephalosporin use.   Denies airway involvement   . Other Rash    STERI STRIPS - Blisters  . Tape Hives    Can tolerate paper tape    Medications: I have reviewed the patient's current medications.  Results for orders placed or performed during the hospital encounter of 12/12/16 (from the past 48 hour(s))  CBC     Status: Abnormal   Collection  Time: 12/15/16  4:37 AM  Result Value Ref Range   WBC 6.3 4.0 - 10.5 K/uL   RBC 2.92 (L) 3.87 - 5.11 MIL/uL   Hemoglobin 9.0 (L) 12.0 - 15.0 g/dL   HCT 13.5 (L) 65.2 - 78.0 %   MCV 96.2 78.0 - 100.0 fL   MCH 30.8 26.0 - 34.0 pg   MCHC 32.0 30.0 - 36.0 g/dL   RDW 24.4 (H) 32.9 - 85.1 %   Platelets 222 150 - 400 K/uL  Basic metabolic panel     Status: Abnormal   Collection Time: 12/15/16  4:37 AM  Result Value Ref Range   Sodium 132 (L) 135 - 145 mmol/L   Potassium 3.8 3.5 - 5.1 mmol/L   Chloride 99 (L) 101 - 111 mmol/L   CO2 27 22 - 32 mmol/L   Glucose, Bld 106 (H) 65 - 99 mg/dL   BUN 16 6 - 20 mg/dL   Creatinine, Ser 1.00 0.44 - 1.00 mg/dL   Calcium 8.5 (L) 8.9 - 10.3 mg/dL   GFR calc non Af Amer >60 >60 mL/min   GFR calc Af Amer >60 >60 mL/min    Comment: (NOTE) The eGFR has been calculated using the CKD EPI equation. This calculation has not been validated in all clinical situations. eGFR's persistently <60 mL/min signify possible Chronic Kidney Disease.    Anion gap 6 5 - 15  Hepatic function panel     Status: Abnormal   Collection Time: 12/15/16   4:37 AM  Result Value Ref Range   Total Protein 6.8 6.5 - 8.1 g/dL   Albumin 2.7 (L) 3.5 - 5.0 g/dL   AST 43 (H) 15 - 41 U/L   ALT 20 14 - 54 U/L   Alkaline Phosphatase 113 38 - 126 U/L   Total Bilirubin 1.5 (H) 0.3 - 1.2 mg/dL   Bilirubin, Direct 0.3 0.1 - 0.5 mg/dL   Indirect Bilirubin 1.2 (H) 0.3 - 0.9 mg/dL  CBC     Status: Abnormal   Collection Time: 12/16/16  4:04 AM  Result Value Ref Range   WBC 5.2 4.0 - 10.5 K/uL   RBC 2.95 (L) 3.87 - 5.11 MIL/uL   Hemoglobin 9.1 (L) 12.0 - 15.0 g/dL   HCT 83.8 (L) 78.2 - 80.7 %   MCV 97.6 78.0 - 100.0 fL   MCH 30.8 26.0 - 34.0 pg   MCHC 31.6 30.0 - 36.0 g/dL   RDW 66.6 (H) 62.3 - 12.9 %   Platelets 215 150 - 400 K/uL  Basic metabolic panel     Status: Abnormal   Collection Time: 12/16/16  4:04 AM  Result Value Ref Range   Sodium 134 (L) 135 - 145 mmol/L   Potassium 3.8 3.5 - 5.1 mmol/L   Chloride 100 (L) 101 - 111 mmol/L   CO2 27 22 - 32 mmol/L   Glucose, Bld 106 (H) 65 - 99 mg/dL   BUN 13 6 - 20 mg/dL   Creatinine, Ser 0.64 0.44 - 1.00 mg/dL   Calcium 8.4 (L) 8.9 - 10.3 mg/dL   GFR calc non Af Amer >60 >60 mL/min   GFR calc Af Amer >60 >60 mL/min    Comment: (NOTE) The eGFR has been calculated using the CKD EPI equation. This calculation has not been validated in all clinical situations. eGFR's persistently <60 mL/min signify possible Chronic Kidney Disease.    Anion gap 7 5 - 15    No results found.  Review of Systems  Constitutional: Positive for chills, fever, malaise/fatigue and weight loss.  Musculoskeletal: Positive for joint pain and myalgias.  Neurological: Positive for weakness.   Blood pressure (!) 142/64, pulse (!) 110, temperature (!) 100.7 F (38.2 C), temperature source Oral, resp. rate 18, height '5\' 7"'$  (1.702 m), weight 70.3 kg (155 lb), last menstrual period 06/27/2007, SpO2 95 %. Physical Exam  Constitutional:  Thin female lying in bed and currently in NAD.   Cardiovascular:  Slightly tachycardic   Respiratory: Effort normal. No respiratory distress.  Skin:  Right knee incision with staples intact. Centrally there is drainage and dried blood ~ 5 x 5 cm area with nylon retention sutures in place. There is edema and the skin is very tight over the area into the mid thigh and proximal calf.    Assessment/Plan: S/P resection of infected right TKA now with antibiotic spacer in place with residual open wound over right anterior knee joint- Agree with plans for repeat I&D today for drainage of fluid/hematoma and VAC placement to any residual wound.  Will likely need gastroc flap in the future once able to have right TKA replaced for definitive joint coverage.   Julie Nay,PA-C Plastic Surgery [phone]

## 2016-12-16 NOTE — Progress Notes (Signed)
INFECTIOUS DISEASE PROGRESS NOTE  ID: [name] is a 61 y.o. female with  Principal Problem:   Acute blood loss anemia Active Problems:   Metastatic breast cancer (HCC)   Anxiety, generalized   Postoperative anemia due to acute blood loss   S/P total knee arthroplasty, right   Right knee abx spacer   Pyogenic arthritis of right knee joint (HCC)  Subjective: Without complaints.   Abtx:  Anti-infectives    Start     Dose/Rate Route Frequency Ordered Stop   12/16/16 1257  tobramycin (NEBCIN) powder  Status:  Discontinued       As needed 12/16/16 1257 12/16/16 1353   12/16/16 1257  vancomycin (VANCOCIN) powder  Status:  Discontinued       As needed 12/16/16 1258 12/16/16 1353   12/16/16 0959  vancomycin (VANCOCIN) IVPB 1000 mg/200 mL premix     1,000 mg 200 mL/hr over 60 Minutes Intravenous On call to O.R. 12/16/16 0959 12/16/16 1126   12/12/16 2100  vancomycin (VANCOCIN) IVPB 1000 mg/200 mL premix     1,000 mg 200 mL/hr over 60 Minutes Intravenous Every 12 hours 12/12/16 1551     12/12/16 1830  cefTRIAXone (ROCEPHIN) 2 g in dextrose 5 % 50 mL IVPB     2 g 100 mL/hr over 30 Minutes Intravenous Every 24 hours 12/12/16 1822     12/12/16 1715  cefTRIAXone (ROCEPHIN) IVPB  Status:  Discontinued    Comments:  Indication:  Prosthetic Joint Infection Last Day of Therapy:  01/13/17 Labs - Once weekly:  CBC/D and BMP, Labs - Every other week:  ESR and CRP     2 g Intravenous Every 24 hours 12/12/16 1705 12/12/16 1710   12/12/16 1715  vancomycin IVPB  Status:  Discontinued    Comments:  Indication:  Prosthetic Joint Infection Last Day of Therapy:  01/13/17 Labs - Sunday/Monday:  CBC/D, BMP, and vancomycin trough. Labs - Thursday:  BMP and vancomycin trough Labs - Every other week:  ESR and CRP     1,000 mg Intravenous Every 12 hours 12/12/16 1705 12/12/16 1708      Medications:  Scheduled: . cholecalciferol  2,000 Units Oral Once per day on Sun Tue Thu Sat  . darifenacin  15  mg Oral Daily  . [START ON 12/18/2016] enoxaparin (LOVENOX) injection  70 mg Subcutaneous Q24H  . ferrous sulfate  325 mg Oral TID PC  . gabapentin  300 mg Oral TID  . HYDROmorphone      . HYDROmorphone      . HYDROmorphone      . polyethylene glycol  17 g Oral BID    Objective: Vital signs in last 24 hours: Temp:  [98.1 F (36.7 C)-100.7 F (38.2 C)] 98.3 F (36.8 C) (07/24 1546) Pulse Rate:  [87-112] 92 (07/24 1546) Resp:  [11-22] 15 (07/24 1546) BP: (115-142)/(64-88) 124/69 (07/24 1546) SpO2:  [95 %-100 %] 100 % (07/24 1546)   General appearance: alert, cooperative and no distress Resp: clear to auscultation bilaterally Chest wall: no tenderness, at port site. no fluctuance.  Cardio: regular rate and rhythm GI: normal findings: bowel sounds normal and soft, non-tender Extremities: edema none at feet and R leg wrapped.   Lab Results  Recent Labs  12/15/16 0437 12/16/16 0404  WBC 6.3 5.2  HGB 9.0* 9.1*  HCT 28.1* 28.8*  NA 132* 134*  K 3.8 3.8  CL 99* 100*  CO2 27 27  BUN 16 13  CREATININE 0.63  0.62   Liver Panel  Recent Labs  12/15/16 0437  PROT 6.8  ALBUMIN 2.7*  AST 43*  ALT 20  ALKPHOS 113  BILITOT 1.5*  BILIDIR 0.3  IBILI 1.2*   Sedimentation Rate No results for input(s): ESRSEDRATE in the last 72 hours. C-Reactive Protein No results for input(s): CRP in the last 72 hours.  Microbiology: Recent Results (from the past 240 hour(s))  Culture, blood (routine x 2)     Status: None (Preliminary result)   Collection Time: 12/12/16  3:49 PM  Result Value Ref Range Status   Specimen Description BLOOD PORT  Final   Special Requests   Final    BOTTLES DRAWN AEROBIC AND ANAEROBIC Blood Culture adequate volume   Culture   Final    NO GROWTH 4 DAYS Performed at [facility], [address], [place], [place] [address]    Report Status PENDING  Incomplete  Culture, blood (routine x 2)     Status: None (Preliminary result)   Collection Time:  12/12/16  3:54 PM  Result Value Ref Range Status   Specimen Description BLOOD BLOOD RIGHT ARM  Final   Special Requests   Final    BOTTLES DRAWN AEROBIC AND ANAEROBIC Blood Culture adequate volume   Culture   Final    NO GROWTH 4 DAYS Performed at [facility], [place] [address], [place], [place] [address]    Report Status PENDING  Incomplete  Surgical pcr screen     Status: None   Collection Time: 12/16/16  5:00 AM  Result Value Ref Range Status   MRSA, PCR NEGATIVE NEGATIVE Final   Staphylococcus aureus NEGATIVE NEGATIVE Final    Comment:        The Xpert SA Assay (FDA approved for NASAL specimens in patients over 21 years of age), is one component of a comprehensive surveillance program.  Test performance has been validated by [facility] for patients greater than or equal to 67 year old. It is not intended to diagnose infection nor to guide or monitor treatment.     Studies/Results: No results found.   Assessment/Plan: PJI (E faecalis)  Hemarthrosis  Breast CA with mets to chest wall On chemotherapy at [facility] (last 7-3).               Total days of antibiotics: 15             vanco (7-9), ceftriaxone (7-19)  Temp 100.7 this AM, will watch. Suspect due to hemarthrosis.  Had replacement of anbx spacer and I & D today.  H/H stable Home with vanco/ceftriaxone when stable.  Her vanco levels, labs were discussed with her.  My great appreciation to pharmacy for assistance with [facility] (pager) [phone] [email or web address] 12/16/2016, 4:44 PM  LOS: 4 days

## 2016-12-16 NOTE — Progress Notes (Signed)
ANTICOAGULATION CONSULT NOTE - Lovenox  Pharmacy consulted for assistance of Lovenox dosing.  Anticoagulation history: 89 yoF has been anticoagulated since 2010 for history of clots surrounding port-a-cath per patient.  This is in the setting of metastatic breast cancer.  Per chart review, notes indicate that CT chest in 12/2008 revealed thrombus in the innominate vein and superior vena cava at the site of a Port-A-Cath.  In 2010, patient was started on warfarin 1 mg prophylaxis treatment and then transitioned to "treatment dose of SQ enoxaparin, 2010."  Duration of therapy is indefinitely, as long as port-a-cath remains in place.  At one point, patient was transitioned to apixaban but had experienced significant bruising so patient requested transition back to Lovenox last month (June 2018).  Patient is on Lovenox 70 mg SQ q24h PTA per patient's hematologist at [facility] who resumed this dose that she was on previously.  Per [facility] hematology notes, target LMWF level is measurable drug at nadir on 24 hour dosing.  Last LMWH nadir level obtained = 0.15 unit/mL on 70 mg q24h dosing in 12/2014.  Current admission: Upon admission, patient's hemoglobin = 6.1 g/dL.  Patient's Hgb had been steadily declining since resection of right TKA with placement of antibiotic spacer on 7/10.  Has improved to 8.9 following 2 units PRBC.  Patient's PTA Lovenox dose of 70 mg q24h was resumed.  Ortho concerned that patient requires a lower Lovenox dose.    12/14/16 4 hour LMWH level = 0.42 unit/ml  Therapeutic 4-hour level for 1 mg/kg dose is 0.6-1 unit/ml.  Since patient's level is below 1 unit/ml, do not consider her 1 mg/kg dose (70 mg) toxic.  Note that her level is below the therapeutic window; however, per her hematology office, their goal is measurable drug at nadir on 24 hour dosing for her long term therapy. Her 4-hour level is within the prophylactic target range of 0.3-0.6 unit/ml.  Keeping with her hematologist's plan,  will continue with Lovenox 70 mg q24h as they had prescribed.    12/16/16 Lovenox last given on 7/22 and held for repeat I&D of right knee today (7/24).  Dr. Alvan Dame would like to hold Lovenox for 48 hrs post-op before resuming. This is due to increased risk of bleeding after surgery. May resume her regular dose / pharmacy recommended dose after 48 hours.   Plan: Will resume Lovenox 70 mg SQ q24h on 12/18/16 @ 16:00 (> 48 hr post-op)   Check CBC at least q72h.  Leone Haven, PharmD 12/16/16 @ 15:55

## 2016-12-17 ENCOUNTER — Encounter (HOSPITAL_COMMUNITY): Payer: Self-pay | Admitting: Orthopedic Surgery

## 2016-12-17 LAB — CBC
HCT: 28.4 % — ABNORMAL LOW (ref 36.0–46.0)
Hemoglobin: 9.1 g/dL — ABNORMAL LOW (ref 12.0–15.0)
MCH: 30.3 pg (ref 26.0–34.0)
MCHC: 32 g/dL (ref 30.0–36.0)
MCV: 94.7 fL (ref 78.0–100.0)
Platelets: 196 10*3/uL (ref 150–400)
RBC: 3 MIL/uL — ABNORMAL LOW (ref 3.87–5.11)
RDW: 23.2 % — AB (ref 11.5–15.5)
WBC: 2.9 10*3/uL — ABNORMAL LOW (ref 4.0–10.5)

## 2016-12-17 LAB — BASIC METABOLIC PANEL
ANION GAP: 7 (ref 5–15)
BUN: 17 mg/dL (ref 6–20)
CALCIUM: 8.1 mg/dL — AB (ref 8.9–10.3)
CO2: 27 mmol/L (ref 22–32)
CREATININE: 0.52 mg/dL (ref 0.44–1.00)
Chloride: 104 mmol/L (ref 101–111)
GFR calc Af Amer: >60 mL/min (ref >60)
GFR calc non Af Amer: >60 mL/min (ref >60)
GLUCOSE: 121 mg/dL — AB (ref 65–99)
Potassium: 3.9 mmol/L (ref 3.5–5.1)
Sodium: 138 mmol/L (ref 135–145)

## 2016-12-17 LAB — CULTURE, BLOOD (ROUTINE X 2)
Culture: NO GROWTH
Culture: NO GROWTH
SPECIAL REQUESTS: ADEQUATE
Special Requests: ADEQUATE

## 2016-12-17 NOTE — Progress Notes (Signed)
PROGRESS NOTE                                                                                                                                                                                                             Patient Demographics:    [name], is a 61 y.o. female, DOB - 10/01/1955, DSK:[id number]  Admit date - 12/12/2016   Admitting Physician Reyne Dumas, MD  Outpatient Primary MD for the patient is Birdie Riddle Aundra Millet, MD  LOS - 5  Outpatient Specialists: Oncologist at [facility] Complaint  Patient presents with  . Abnormal Lab       Brief Narrative   61 year old female with history of breast cancer followed at [facility] by Dr. Delice Bison, status post recent chemotherapy on 11/25/2016, left knee arthroplasty in 2009 and then right TKA in 62/0355 complicated by a fall.  She underwent 3 I&D since November 2018. She was on oral cephalexin but had persistent drainage with cellulitis and was treated with short course of vancomycin and aztreonam and then transitioned to oral ciprofloxacin. However the wound continued to drain and was readmitted in January 2018. She underwent I&D and poorly exchange and VAC placement with joint culture growing Enterococcus faecalis and copious 6 weeks of IV vancomycin then transitioned to oral amoxicillin but developed hives on it. She was then transitioned to oral linezolid for about 6 weeks which she stopped on her on with concerns for side effects. She required surgical exploration of the wound by Dr. Marla Roe in March 2018 and did well but developed a traumatic hematoma which required debridement and culture grew anaerobe. She was placed on Flagyl which was taking until 3 weeks prior to admission. She was readmitted in July 2018 and underwent resection of the right total knee arthroplasty with placement of antibiotic spacer by Dr. Alvan Dame and discharged on 12/22/2016 on IV vancomycin and  Rocephin to be completed through 01/13/2017. However patient was only receiving IV vancomycin since her discharge home for reasons unclear. Dr. Alvan Dame check her blood work and was found to have a drop in her hemoglobin )7---> 5.4) and was advised to the ED.  Patient was placed back on empiric antibiotics and underwent repeat excisional debridement of the right knee replacement and revision of her antibiotic spacer on 7/24.     Subjective:   Patient reports  feeling much better today.   Assessment  & Plan :   Principal problem Acute blood loss anemia Suspect combination of right knee hematoma, anemia of chronic disease. Received 2 units PRBC with improvement. Lovenox was held for procedure. Plan to resume Lovenox at 40 mg daily for at least 2 weeks to avoid hematoma. Continue iron supplement.  History of DVT and anticoagulation use. Patient reports history of DVT about 8 years back. Since she was diagnosed of breast cancer started on chemotherapy per oncologist placed her on low-dose Coumadin (1 mg daily) for high risk of DVT or malignancy. Since he was on a different medication that would interact with Coumadin (I believe it was one of the antibiotics), she was switched to eliquis started to bruise frequently. She was then switched to Lovenox 70 mg daily by her oncologist.  Plan to reduce dose of Lovenox 40 mg daily for at least 2 weeks.  Prosthetic joint infection/hematoma Excisional debridement with replacement of antibiotic spacer. Continue IV vancomycin and Rocephin. ID consult appreciated. Likely discharge home on 6 weeks antibiotic regimen as outlined before (until 01/13/2017) PT/OT eval.  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 Currently On chemotherapy. Follows with oncologist at [facility].  Mitral regurgitation Appears stable. Recent 2-D echo with normal EF and no wall motion  abnormality.  Code Status : Full code  Family Communication  : None at bedside  Disposition Plan  : Home possibly tomorrow  Barriers For Discharge : Improving symptoms  Consults  :   Orthopedics ID  Procedures  : Excisional debridement and replacement of antibiotic spacer in the right knee  DVT Prophylaxis  : SCDs  Lab Results  Component Value Date   PLT 196 12/17/2016    Antibiotics  :    Anti-infectives    Start     Dose/Rate Route Frequency Ordered Stop   12/16/16 1257  tobramycin (NEBCIN) powder  Status:  Discontinued       As needed 12/16/16 1257 12/16/16 1353   12/16/16 1257  vancomycin (VANCOCIN) powder  Status:  Discontinued       As needed 12/16/16 1258 12/16/16 1353   12/16/16 0959  vancomycin (VANCOCIN) IVPB 1000 mg/200 mL premix     1,000 mg 200 mL/hr over 60 Minutes Intravenous On call to O.R. 12/16/16 0959 12/16/16 1126   12/12/16 2100  vancomycin (VANCOCIN) IVPB 1000 mg/200 mL premix     1,000 mg 200 mL/hr over 60 Minutes Intravenous Every 12 hours 12/12/16 1551     12/12/16 1830  cefTRIAXone (ROCEPHIN) 2 g in dextrose 5 % 50 mL IVPB     2 g 100 mL/hr over 30 Minutes Intravenous Every 24 hours 12/12/16 1822     12/12/16 1715  cefTRIAXone (ROCEPHIN) IVPB  Status:  Discontinued    Comments:  Indication:  Prosthetic Joint Infection Last Day of Therapy:  01/13/17 Labs - Once weekly:  CBC/D and BMP, Labs - Every other week:  ESR and CRP     2 g Intravenous Every 24 hours 12/12/16 1705 12/12/16 1710   12/12/16 1715  vancomycin IVPB  Status:  Discontinued    Comments:  Indication:  Prosthetic Joint Infection Last Day of Therapy:  01/13/17 Labs - Sunday/Monday:  CBC/D, BMP, and vancomycin trough. Labs - Thursday:  BMP and vancomycin trough Labs - Every other week:  ESR and CRP     1,000 mg Intravenous Every  12 hours 12/12/16 1705 12/12/16 1708        Objective:   Vitals:   12/16/16 1927 12/17/16 0039 12/17/16 0450 12/17/16 1034  BP: 102/60 (!) 93/52  (!) 113/50 (!) 103/48  Pulse: 90 83 82 88  Resp: _0 Temp: 97.9 F (36.6 C) 97.8 F (36.6 C) (!) 97.4 F (36.3 C) 98.5 F (36.9 C)  TempSrc: Oral Oral Oral Oral  SpO2: 91% 100% 99% 96%  Weight:      Height:        Wt Readings from Last 3 Encounters:  12/12/16 70.3 kg (155 lb)  12/02/16 70.3 kg (155 lb)  11/27/16 70.5 kg (155 lb 6.4 oz)     Intake/Output Summary (Last 24 hours) at 12/17/16 1437 Last data filed at 12/17/16 0830  Gross per 24 hour  Intake           1922.5 ml  Output             2480 ml  Net           -557.5 ml     Physical Exam  Gen: not in distress HEENT:  pallor+, moist mucosa, supple neck Chest: clear b/l, no added sounds CVS: N O1&B5, 3/6 systolic murmur GI: soft, NT, ND,  Musculoskeletal: warm, dressing over right knee with wound VAC in place     Data Review:    CBC  Recent Labs Lab 12/12/16 1325 12/13/16 0549 12/13/16 2105 12/14/16 0515 12/15/16 0437 12/16/16 0404 12/17/16 0436  WBC 6.2 6.5  --  6.1 6.3 5.2 2.9*  HGB 6.1* 8.9* 8.4* 8.9* 9.0* 9.1* 9.1*  HCT 19.2* 27.5* 26.3* 28.0* 28.1* 28.8* 28.4*  PLT 231 232  --  223 222 215 196  MCV 96.0 92.0  --  95.2 96.2 97.6 94.7  MCH 30.5 29.8  --  30.3 30.8 30.8 30.3  MCHC 31.8 32.4  --  31.8 32.0 31.6 32.0  RDW 23.2* 22.0*  --  23.2* 23.7* 24.2* 23.2*  LYMPHSABS 1.0  --   --   --   --   --   --   MONOABS 0.4  --   --   --   --   --   --   EOSABS 0.1  --   --   --   --   --   --   BASOSABS 0.0  --   --   --   --   --   --     Chemistries   Recent Labs Lab 12/12/16 1325 12/13/16 0549 12/13/16 1300 12/14/16 0515 12/15/16 0437 12/16/16 0404 12/17/16 0436  NA 135 134*  --  138 132* 134* 138  K 3.6 3.5  --  3.8 3.8 3.8 3.9  CL 99* 97*  --  102 99* 100* 104  CO2 28 27  --  _1 GLUCOSE 99 101*  --  101* 106* 106* 121*  BUN 13 8  --  _2 CREATININE 0.62 0.63  --  0.63 0.63 0.62 0.52  CALCIUM 8.5* 8.3*  --  8.7* 8.5* 8.4* 8.1*  MG 1.8  --   --   --    --   --   --   AST 46* 46*  --   --  43*  --   --   ALT 24 23  --   --  20  --   --   ALKPHOS 121  127*  --   --  113  --   --   BILITOT 1.7* 2.2* 1.6*  --  1.5*  --   --    ------------------------------------------------------------------------------------------------------------------ No results for input(s): CHOL, HDL, LDLCALC, TRIG, CHOLHDL, LDLDIRECT in the last 72 hours.  No results found for: HGBA1C ------------------------------------------------------------------------------------------------------------------ No results for input(s): TSH, T4TOTAL, T3FREE, THYROIDAB in the last 72 hours.  Invalid input(s): FREET3 ------------------------------------------------------------------------------------------------------------------ No results for input(s): VITAMINB12, FOLATE, FERRITIN, TIBC, IRON, RETICCTPCT in the last 72 hours.  Coagulation profile  Recent Labs Lab 12/12/16 1325  INR 1.08    No results for input(s): DDIMER in the last 72 hours.  Cardiac Enzymes No results for input(s): CKMB, TROPONINI, MYOGLOBIN in the last 168 hours.  Invalid input(s): CK ------------------------------------------------------------------------------------------------------------------    Component Value Date/Time   BNP 42.0 02/17/2008 1122    Inpatient Medications  Scheduled Meds: . cholecalciferol  2,000 Units Oral Once per day on Sun Tue Thu Sat  . darifenacin  15 mg Oral Daily  . ferrous sulfate  325 mg Oral TID PC  . gabapentin  300 mg Oral TID  . polyethylene glycol  17 g Oral BID   Continuous Infusions: . sodium chloride 50 mL/hr at 12/17/16 1400  . cefTRIAXone (ROCEPHIN)  IV Stopped (12/16/16 1924)  . vancomycin Stopped (12/17/16 1103)   PRN Meds:.acetaminophen, HYDROmorphone (DILAUDID) injection, menthol-cetylpyridinium **OR** phenol, methocarbamol, metoCLOPramide **OR** metoCLOPramide (REGLAN) injection, ondansetron **OR** ondansetron (ZOFRAN) IV, oxyCODONE, sodium  chloride flush, zolpidem  Micro Results Recent Results (from the past 240 hour(s))  Culture, blood (routine x 2)     Status: None   Collection Time: 12/12/16  3:49 PM  Result Value Ref Range Status   Specimen Description BLOOD PORT  Final   Special Requests   Final    BOTTLES DRAWN AEROBIC AND ANAEROBIC Blood Culture adequate volume   Culture   Final    NO GROWTH 5 DAYS Performed at [facility], [address] [place], [place] [address]    Report Status 12/17/2016 FINAL  Final  Culture, blood (routine x 2)     Status: None   Collection Time: 12/12/16  3:54 PM  Result Value Ref Range Status   Specimen Description BLOOD BLOOD RIGHT ARM  Final   Special Requests   Final    BOTTLES DRAWN AEROBIC AND ANAEROBIC Blood Culture adequate volume   Culture   Final    NO GROWTH 5 DAYS Performed at [facility], [place] [address], [place],  [address]    Report Status 12/17/2016 FINAL  Final  Surgical pcr screen     Status: None   Collection Time: 12/16/16  5:00 AM  Result Value Ref Range Status   MRSA, PCR NEGATIVE NEGATIVE Final   Staphylococcus aureus NEGATIVE NEGATIVE Final    Comment:        The Xpert SA Assay (FDA approved for NASAL specimens in patients over 32 years of age), is one component of a comprehensive surveillance program.  Test performance has been validated by [facility] for patients greater than or equal to 85 year old. It is not intended to diagnose infection nor to guide or monitor treatment.     Radiology Reports Dg Chest Port 1 View  Result Date: 12/12/2016 CLINICAL DATA:  61 year old female with history of fever for the past 2 days. Fatigue and weakness. History breast cancer. EXAM: PORTABLE CHEST 1 VIEW COMPARISON:  Chest x-ray 05/10/2016. FINDINGS: Lung volumes are slightly low. No  acute consolidative airspace disease. No pleural effusions. No evidence of pulmonary edema. However, the central pulmonary arteries are very prominent. Heart  size is mildly enlarged. The patient is rotated to the right on today's exam, resulting in distortion of the mediastinal contours and reduced diagnostic sensitivity and specificity for mediastinal pathology. Right internal jugular single-lumen porta cath with tip terminating at the superior cavoatrial junction. Surgical clips noted throughout the chest wall and in the axillary regions bilaterally. IMPRESSION: 1. No definite radiographic evidence of acute cardiopulmonary disease. 2. Cardiomegaly. 3. Dilatation of the proximal pulmonary arteries, concerning for pulmonary arterial hypertension. Electronically Signed   By: Vinnie Langton M.D.   On: 12/12/2016 16:16    Time Spent in minutes  25   Louellen Molder M.D on 12/17/2016 at 2:37 PM  Between 7am to 7pm - Pager - [phone]  After 7pm go to [email or web address] - password [facility]  [facility] -  Office  [phone]

## 2016-12-17 NOTE — Progress Notes (Addendum)
     Subjective: 1 Day Post-Op Procedure(s) (LRB): Repeat irrigation and debridement right knee, wound closure  wound vac REPLACEMENT ANTIBIOTIC SPACERS (Right)   Seen by Dr. Alvan Dame. Patient reports pain as mild, pain controlled. No events throughout the night.  Appears to be stable after 1 unit of blood yesterday.Have discussed a change to her Lovenox for a short time. Wound vac appears to be working well without issues.  Objective:   VITALS:   Vitals:   12/17/16 0039 12/17/16 0450  BP: (!) 93/52 (!) 113/50  Pulse: 83 82  Resp: 16 16  Temp: 97.8 F (36.6 C) (!) 97.4 F (36.3 C)    Neurovascular intact Incision: wound vac in place and working properly  LABS  Recent Labs  12/15/16 0437 12/16/16 0404 12/17/16 0436  HGB 9.0* 9.1* 9.1*  HCT 28.1* 28.8* 28.4*  WBC 6.3 5.2 2.9*  PLT 222 215 196     Recent Labs  12/15/16 0437 12/16/16 0404 12/17/16 0436  NA 132* 134* 138  K 3.8 3.8 3.9  BUN 16 13 17   CREATININE 0.63 0.62 0.52  GLUCOSE 106* 106* 121*     Assessment/Plan: 1 Day Post-Op Procedure(s) (LRB): Repeat irrigation and debridement right knee, wound closure  wound vac REPLACEMENT ANTIBIOTIC SPACERS (Right)   H&H appears to be stable after 1 unit of blood yesterday  Advance diet  Plan on home at d/c, probably keep until Friday to make sure H&H continues to be stable and monitor wound vac is working properly  Plan on d/c of HV drain tomorrow depending on output.  Will change Lovenox from 70 to 40 for 2 weeks, starting on Friday, do to the increased chance of bleeding post-op  Will check wound vac daily and probably change on Friday.  Wound vac care will need to be set up for home   ABLA  Treated with 1 unit of blood yesterday. Appears stable after the 1 unit. Continued treatment with iron and will observe       [place] Pugh. Hoyt Leanos   PAC  12/17/2016, 8:18 AM

## 2016-12-17 NOTE — Progress Notes (Signed)
OT Cancellation Note  Patient Details Name: [name] MRN: [id number] DOB: 1956/01/03   Cancelled Treatment:    Reason Eval/Treat Not Completed: OT screened, no needs identified, will sign off  Caleel Kiner 12/17/2016, 3:04 PM  Lesle Chris, OTR/L [phone] 12/17/2016

## 2016-12-17 NOTE — Evaluation (Signed)
Physical Therapy Evaluation Patient Details Name: [name] MRN: [id number] DOB: 1955/10/01 Today's Date: 12/17/2016   History of Present Illness  61 yo female admitted with anema. S/P I&D, replacement of antibiotic articulating spacer 7/24. Hx of R TKA, fall, R TK resection and spacer 12/03/16, breast cancer-chemo.    Clinical Impression  On eval, pt required Min assist for mobility. She walked ~15 feet x 2 (to and from bathroom) with a RW. Some pain with activity but pt was able to participate. She is familiar with process from last hospital stay. She is aware of WB precautions and need for the KI. She is somewhat hesitant to progress ambulation distance on today. Will follow and progress activity as tolerated.    Follow Up Recommendations No PT follow up;Supervision - Intermittent    Equipment Recommendations  None recommended by PT    Recommendations for Other Services       Precautions / Restrictions Precautions Precautions: Fall;Knee Precaution Comments: drain (hemovac), wound vac Required Braces or Orthoses: Knee Immobilizer - Right Knee Immobilizer - Right: On when out of bed or walking Restrictions Weight Bearing Restrictions: Yes RLE Weight Bearing: Partial weight bearing RLE Partial Weight Bearing Percentage or Pounds: 50%      Mobility  Bed Mobility Overal bed mobility: Needs Assistance Bed Mobility: Supine to Sit     Supine to sit: Min guard;HOB elevated     General bed mobility comments: Pt used leg lifter. Increased time.   Transfers Overall transfer level: Needs assistance Equipment used: Rolling walker (2 wheeled) Transfers: Sit to/from Stand Sit to Stand: Min assist         General transfer comment: Assist to rise, stabilize, control descent. VCs safety, technique, hand/LE placement.   Ambulation/Gait Ambulation/Gait assistance: Min assist Ambulation Distance (Feet): 15 Feet (x2) Assistive device: Rolling walker (2 wheeled) Gait  Pattern/deviations: Step-to pattern     General Gait Details: VCs safety, sequence, adherence to PWB status. Slow gait speed. Pt hesitant to increase ambulation distance on today.   Stairs            Wheelchair Mobility    Modified Rankin (Stroke Patients Only)       Balance Overall balance assessment: Needs assistance;History of Falls         Standing balance support: Bilateral upper extremity supported Standing balance-Leahy Scale: Poor Standing balance comment: requiring RW                             Pertinent Vitals/Pain Pain Assessment: Faces Faces Pain Scale: Hurts little more Pain Location: R LE Pain Descriptors / Indicators: Aching;Sore Pain Intervention(s): Monitored during session;Limited activity within patient's tolerance;Repositioned    Home Living Family/patient expects to be discharged to:: Private residence Living Arrangements: Spouse/significant other;Children Available Help at Discharge: Family Type of Home: House Home Access: Stairs to enter Entrance Stairs-Rails: Right Entrance Stairs-Number of Steps: 5 Home Layout: Two level;Able to live on main level with bedroom/bathroom Home Equipment: Gilford Rile - 2 wheels;Cane - single point;Bedside commode;Walker - 4 wheels Additional Comments: 1/2 bath and single bed on main level    Prior Function Level of Independence: Needs assistance   Gait / Transfers Assistance Needed: minimal ambulation with RW.   ADL's / Homemaking Assistance Needed: Family assists PRN  Comments: Works full time as Therapist, sports in cath lab. 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      Hand Dominance        Extremity/Trunk Assessment  Upper Extremity Assessment Upper Extremity Assessment: Generalized weakness    Lower Extremity Assessment Lower Extremity Assessment: Generalized weakness    Cervical / Trunk Assessment Cervical / Trunk Assessment: Normal  Communication      Cognition Arousal/Alertness: Awake/alert Behavior  During Therapy: WFL for tasks assessed/performed Overall Cognitive Status: Within Functional Limits for tasks assessed                                        General Comments      Exercises     Assessment/Plan    PT Assessment Patient needs continued PT services  PT Problem List Decreased strength;Decreased mobility;Decreased range of motion;Decreased activity tolerance;Decreased balance;Decreased knowledge of use of DME;Pain       PT Treatment Interventions DME instruction;Therapeutic activities;Gait training;Therapeutic exercise;Patient/family education;Balance training;Functional mobility training;Stair training    PT Goals (Current goals can be found in the Care Plan section)  Acute Rehab PT Goals Patient Stated Goal: to get better and get new knee PT Goal Formulation: With patient/family Time For Goal Achievement: 12/31/16 Potential to Achieve Goals: Good    Frequency Min 5X/week   Barriers to discharge        Co-evaluation               AM-PAC PT "6 Clicks" Daily Activity  Outcome Measure Difficulty turning over in bed (including adjusting bedclothes, sheets and blankets)?: A Little Difficulty moving from lying on back to sitting on the side of the bed? : A Little Difficulty sitting down on and standing up from a chair with arms (e.g., wheelchair, bedside commode, etc,.)?: Total Help needed moving to and from a bed to chair (including a wheelchair)?: A Little Help needed walking in hospital room?: A Little Help needed climbing 3-5 steps with a railing? : A Little 6 Click Score: 16    End of Session Equipment Utilized During Treatment: Gait belt Activity Tolerance: Patient tolerated treatment well Patient left: in chair;with call bell/phone within reach;with family/visitor present   PT Visit Diagnosis: Difficulty in walking, not elsewhere classified (R26.2);Muscle weakness (generalized) (M62.81)    Time: 4315-4008 PT Time Calculation  (min) (ACUTE ONLY): 20 min   Charges:   PT Evaluation $PT Eval Low Complexity: 1 Procedure     PT G Codes:         Weston Anna, MPT Pager: [phone]

## 2016-12-17 NOTE — Progress Notes (Signed)
INFECTIOUS DISEASE PROGRESS NOTE  ID: [name] is a 61 y.o. female with  Principal Problem:   Acute blood loss anemia Active Problems:   Metastatic breast cancer (HCC)   Anxiety, generalized   Postoperative anemia due to acute blood loss   S/P total knee arthroplasty, right   Right knee abx spacer   Pyogenic arthritis of right knee joint (HCC)  Subjective: Without complaints.  Has been up to chair.  Eating well.   Abtx:  Anti-infectives    Start     Dose/Rate Route Frequency Ordered Stop   12/16/16 1257  tobramycin (NEBCIN) powder  Status:  Discontinued       As needed 12/16/16 1257 12/16/16 1353   12/16/16 1257  vancomycin (VANCOCIN) powder  Status:  Discontinued       As needed 12/16/16 1258 12/16/16 1353   12/16/16 0959  vancomycin (VANCOCIN) IVPB 1000 mg/200 mL premix     1,000 mg 200 mL/hr over 60 Minutes Intravenous On call to O.R. 12/16/16 0959 12/16/16 1126   12/12/16 2100  vancomycin (VANCOCIN) IVPB 1000 mg/200 mL premix     1,000 mg 200 mL/hr over 60 Minutes Intravenous Every 12 hours 12/12/16 1551     12/12/16 1830  cefTRIAXone (ROCEPHIN) 2 g in dextrose 5 % 50 mL IVPB     2 g 100 mL/hr over 30 Minutes Intravenous Every 24 hours 12/12/16 1822     12/12/16 1715  cefTRIAXone (ROCEPHIN) IVPB  Status:  Discontinued    Comments:  Indication:  Prosthetic Joint Infection Last Day of Therapy:  01/13/17 Labs - Once weekly:  CBC/D and BMP, Labs - Every other week:  ESR and CRP     2 g Intravenous Every 24 hours 12/12/16 1705 12/12/16 1710   12/12/16 1715  vancomycin IVPB  Status:  Discontinued    Comments:  Indication:  Prosthetic Joint Infection Last Day of Therapy:  01/13/17 Labs - Sunday/Monday:  CBC/D, BMP, and vancomycin trough. Labs - Thursday:  BMP and vancomycin trough Labs - Every other week:  ESR and CRP     1,000 mg Intravenous Every 12 hours 12/12/16 1705 12/12/16 1708      Medications:  Scheduled: . cholecalciferol  2,000 Units Oral Once per day  on Sun Tue Thu Sat  . darifenacin  15 mg Oral Daily  . ferrous sulfate  325 mg Oral TID PC  . gabapentin  300 mg Oral TID  . polyethylene glycol  17 g Oral BID    Objective: Vital signs in last 24 hours: Temp:  [97.4 F (36.3 C)-98.5 F (36.9 C)] 98.3 F (36.8 C) (07/25 1530) Pulse Rate:  [82-98] 98 (07/25 1530) Resp:  [14-16] 14 (07/25 1530) BP: (93-113)/(48-62) 111/54 (07/25 1530) SpO2:  [91 %-100 %] 98 % (07/25 1530)   General appearance: alert, cooperative and no distress Resp: clear to auscultation bilaterally Chest wall: R upper chest port is clean, non-tender. no fluctuance.  Cardio: regular rate and rhythm GI: normal findings: bowel sounds normal and soft, non-tender Extremities: edema 3+ RLE. R knee wrapped.   Lab Results  Recent Labs  12/16/16 0404 12/17/16 0436  WBC 5.2 2.9*  HGB 9.1* 9.1*  HCT 28.8* 28.4*  NA 134* 138  K 3.8 3.9  CL 100* 104  CO2 27 27  BUN 13 17  CREATININE 0.62 0.52   Liver Panel  Recent Labs  12/15/16 0437  PROT 6.8  ALBUMIN 2.7*  AST 43*  ALT 20  ALKPHOS  113  BILITOT 1.5*  BILIDIR 0.3  IBILI 1.2*   Sedimentation Rate No results for input(s): ESRSEDRATE in the last 72 hours. C-Reactive Protein No results for input(s): CRP in the last 72 hours.  Microbiology: Recent Results (from the past 240 hour(s))  Culture, blood (routine x 2)     Status: None   Collection Time: 12/12/16  3:49 PM  Result Value Ref Range Status   Specimen Description BLOOD PORT  Final   Special Requests   Final    BOTTLES DRAWN AEROBIC AND ANAEROBIC Blood Culture adequate volume   Culture   Final    NO GROWTH 5 DAYS Performed at [facility], [address], [place], [place] [address]    Report Status 12/17/2016 FINAL  Final  Culture, blood (routine x 2)     Status: None   Collection Time: 12/12/16  3:54 PM  Result Value Ref Range Status   Specimen Description BLOOD BLOOD RIGHT ARM  Final   Special Requests   Final    BOTTLES DRAWN  AEROBIC AND ANAEROBIC Blood Culture adequate volume   Culture   Final    NO GROWTH 5 DAYS Performed at [facility], [place] [address] [place], [place] [address]    Report Status 12/17/2016 FINAL  Final  Surgical pcr screen     Status: None   Collection Time: 12/16/16  5:00 AM  Result Value Ref Range Status   MRSA, PCR NEGATIVE NEGATIVE Final   Staphylococcus aureus NEGATIVE NEGATIVE Final    Comment:        The Xpert SA Assay (FDA approved for NASAL specimens in patients over 42 years of age), is one component of a comprehensive surveillance program.  Test performance has been validated by [facility] for patients greater than or equal to 54 year old. It is not intended to diagnose infection nor to guide or monitor treatment.     Studies/Results: No results found.   Assessment/Plan: PJI (E faecalis)  Hemarthrosis  Breast CA with mets to chest wall On chemotherapy at [facility] (last 7-3).   Total days of antibiotics: 16 vanco (7-9), ceftriaxone (7-19)  Fever better Her WBC has dropped- I explained to her and her husband- not clear if lab error, dilution or ADR to vanco.  Will watch.  No change in vanco/ceftriaxone for now.          Bobby Rumpf Infectious Diseases (pager) [phone] [email or web address] 12/17/2016, 5:43 PM  LOS: 5 days

## 2016-12-18 ENCOUNTER — Encounter: Payer: Self-pay | Admitting: Physical Therapy

## 2016-12-18 DIAGNOSIS — Z96651 Presence of right artificial knee joint: Secondary | ICD-10-CM

## 2016-12-18 LAB — BASIC METABOLIC PANEL
Anion gap: 6 (ref 5–15)
BUN: 16 mg/dL (ref 6–20)
CALCIUM: 8.1 mg/dL — AB (ref 8.9–10.3)
CO2: 27 mmol/L (ref 22–32)
Chloride: 105 mmol/L (ref 101–111)
Creatinine, Ser: 0.48 mg/dL (ref 0.44–1.00)
GFR calc Af Amer: >60 mL/min (ref >60)
GLUCOSE: 91 mg/dL (ref 65–99)
Potassium: 3.7 mmol/L (ref 3.5–5.1)
Sodium: 138 mmol/L (ref 135–145)

## 2016-12-18 LAB — CBC
HCT: 27.5 % — ABNORMAL LOW (ref 36.0–46.0)
Hemoglobin: 8.9 g/dL — ABNORMAL LOW (ref 12.0–15.0)
MCH: 30.5 pg (ref 26.0–34.0)
MCHC: 32.4 g/dL (ref 30.0–36.0)
MCV: 94.2 fL (ref 78.0–100.0)
PLATELETS: 199 10*3/uL (ref 150–400)
RBC: 2.92 MIL/uL — ABNORMAL LOW (ref 3.87–5.11)
RDW: 23.7 % — AB (ref 11.5–15.5)
WBC: 4.4 10*3/uL (ref 4.0–10.5)

## 2016-12-18 MED ORDER — ENOXAPARIN SODIUM 40 MG/0.4ML ~~LOC~~ SOLN
40.0000 mg | SUBCUTANEOUS | Status: DC
  Administered 2016-12-19: 40 mg via SUBCUTANEOUS
  Filled 2016-12-18: qty 0.4

## 2016-12-18 NOTE — Progress Notes (Signed)
Physical Therapy Treatment Patient Details Name: [name] MRN: [id number] DOB: 1955/06/09 Today's Date: 12/18/2016    History of Present Illness 61 yo female admitted with anema. S/P I&D, replacement of antibiotic articulating spacer 7/24. Hx of R TKA, fall, R TK resection and spacer 12/03/16, breast cancer-chemo.    PT Comments    Pt progressing well with mobility and hopeful for dc home tomorrow.   Follow Up Recommendations  No PT follow up;Supervision - Intermittent     Equipment Recommendations  None recommended by PT    Recommendations for Other Services       Precautions / Restrictions Precautions Precautions: Fall;Knee Required Braces or Orthoses: Knee Immobilizer - Right Knee Immobilizer - Right: On when out of bed or walking Restrictions Weight Bearing Restrictions: Yes RLE Weight Bearing: Partial weight bearing RLE Partial Weight Bearing Percentage or Pounds: 50%    Mobility  Bed Mobility Overal bed mobility: Needs Assistance Bed Mobility: Supine to Sit     Supine to sit: Supervision     General bed mobility comments: Pt used leg lifter. Increased time.   Transfers Overall transfer level: Needs assistance Equipment used: Rolling walker (2 wheeled) Transfers: Sit to/from Stand Sit to Stand: Min guard         General transfer comment: cues for LE management and use of UEs to self assist  Ambulation/Gait Ambulation/Gait assistance: Min guard;Supervision Ambulation Distance (Feet): 180 Feet Assistive device: Rolling walker (2 wheeled) Gait Pattern/deviations: Step-to pattern;Shuffle;Trunk flexed Gait velocity: decr Gait velocity interpretation: Below normal speed for age/gender General Gait Details: VCs safety, sequence, adherence to PWB status. Slow gait speed.    Stairs            Wheelchair Mobility    Modified Rankin (Stroke Patients Only)       Balance Overall balance assessment: Needs assistance;History of  Falls Sitting-balance support: Feet supported;No upper extremity supported Sitting balance-Leahy Scale: Good       Standing balance-Leahy Scale: Fair                              Cognition Arousal/Alertness: Awake/alert Behavior During Therapy: WFL for tasks assessed/performed Overall Cognitive Status: Within Functional Limits for tasks assessed                                        Exercises      General Comments        Pertinent Vitals/Pain Pain Assessment: 0-10 Pain Score: 3  Pain Location: R LE Pain Descriptors / Indicators: Aching;Sore Pain Intervention(s): Limited activity within patient's tolerance;Monitored during session;Premedicated before session;Ice applied    Home Living                      Prior Function            PT Goals (current goals can now be found in the care plan section) Acute Rehab PT Goals Patient Stated Goal: to get better and get new knee PT Goal Formulation: With patient Time For Goal Achievement: 12/31/16 Potential to Achieve Goals: Good Progress towards PT goals: Progressing toward goals    Frequency    Min 5X/week      PT Plan Current plan remains appropriate    Co-evaluation              AM-PAC PT "6  Clicks" Daily Activity  Outcome Measure  Difficulty turning over in bed (including adjusting bedclothes, sheets and blankets)?: A Little Difficulty moving from lying on back to sitting on the side of the bed? : A Little Difficulty sitting down on and standing up from a chair with arms (e.g., wheelchair, bedside commode, etc,.)?: A Little Help needed moving to and from a bed to chair (including a wheelchair)?: A Little Help needed walking in hospital room?: A Little Help needed climbing 3-5 steps with a railing? : A Little 6 Click Score: 18    End of Session Equipment Utilized During Treatment: Gait belt Activity Tolerance: Patient tolerated treatment well Patient left: with  call bell/phone within reach;with family/visitor present;Other (comment) (bathroom - CNA aware) Nurse Communication: Mobility status PT Visit Diagnosis: Difficulty in walking, not elsewhere classified (R26.2);Muscle weakness (generalized) (M62.81)     Time: 2426-8341 PT Time Calculation (min) (ACUTE ONLY): 16 min  Charges:  $Gait Training: 8-22 mins                    G Codes:       Pg [phone]    Avarie Tavano 12/18/2016, 2:52 PM

## 2016-12-18 NOTE — Progress Notes (Signed)
     Subjective: 2 Days Post-Op Procedure(s) (LRB): Repeat irrigation and debridement right knee, wound closure  wound vac REPLACEMENT ANTIBIOTIC SPACERS (Right)   Patient reports pain as mild, pain controlled. States that prior to this surgery she felt like there was just a lot of pain and pressure which is now released. At this time we want to see if her H&H remains stable since she had so many issues prior to this last incident.  Wound vac in place and plan on changing it out tomorrow.  Objective:   VITALS:   Vitals:   12/17/16 2149 12/18/16 0520  BP: 128/60 128/68  Pulse: 95 90  Resp: 15 16  Temp: 98.7 F (37.1 C) 97.9 F (36.6 C)    Dorsiflexion/Plantar flexion intact Compartment soft  Wound vac in good position and good pressure  LABS  Recent Labs  12/16/16 0404 12/17/16 0436 12/18/16 0451  HGB 9.1* 9.1* 8.9*  HCT 28.8* 28.4* 27.5*  WBC 5.2 2.9* 4.4  PLT 215 196 199     Recent Labs  12/16/16 0404 12/17/16 0436 12/18/16 0451  NA 134* 138 138  K 3.8 3.9 3.7  BUN 13 17 16   CREATININE 0.62 0.52 0.48  GLUCOSE 106* 121* 91     Assessment/Plan: 2 Days Post-Op Procedure(s) (LRB): Repeat irrigation and debridement right knee, wound closure  wound vac REPLACEMENT ANTIBIOTIC SPACERS (Right)  HV drain pulled.  Wound vac has good pressure, but no significant drainage Plan on changing wound vac tomorrow. Have ordered for a home wound vac unit Up with therapy Discharge home with home health probably tomorrow     [place] Pugh. [name]   PAC  12/18/2016, 8:52 AM

## 2016-12-18 NOTE — Progress Notes (Addendum)
INFECTIOUS DISEASE PROGRESS NOTE  ID: [name] is a 61 y.o. female with  Principal Problem:   Acute blood loss anemia Active Problems:   Metastatic breast cancer (HCC)   Anxiety, generalized   Postoperative anemia due to acute blood loss   S/P total knee arthroplasty, right   Right knee abx spacer   Pyogenic arthritis of right knee joint (HCC)  Subjective: Up in chair, without complaints.   Abtx:  Anti-infectives    Start     Dose/Rate Route Frequency Ordered Stop   12/16/16 1257  tobramycin (NEBCIN) powder  Status:  Discontinued       As needed 12/16/16 1257 12/16/16 1353   12/16/16 1257  vancomycin (VANCOCIN) powder  Status:  Discontinued       As needed 12/16/16 1258 12/16/16 1353   12/16/16 0959  vancomycin (VANCOCIN) IVPB 1000 mg/200 mL premix     1,000 mg 200 mL/hr over 60 Minutes Intravenous On call to O.R. 12/16/16 0959 12/16/16 1126   12/12/16 2100  vancomycin (VANCOCIN) IVPB 1000 mg/200 mL premix     1,000 mg 200 mL/hr over 60 Minutes Intravenous Every 12 hours 12/12/16 1551     12/12/16 1830  cefTRIAXone (ROCEPHIN) 2 g in dextrose 5 % 50 mL IVPB     2 g 100 mL/hr over 30 Minutes Intravenous Every 24 hours 12/12/16 1822     12/12/16 1715  cefTRIAXone (ROCEPHIN) IVPB  Status:  Discontinued    Comments:  Indication:  Prosthetic Joint Infection Last Day of Therapy:  01/13/17 Labs - Once weekly:  CBC/D and BMP, Labs - Every other week:  ESR and CRP     2 g Intravenous Every 24 hours 12/12/16 1705 12/12/16 1710   12/12/16 1715  vancomycin IVPB  Status:  Discontinued    Comments:  Indication:  Prosthetic Joint Infection Last Day of Therapy:  01/13/17 Labs - Sunday/Monday:  CBC/D, BMP, and vancomycin trough. Labs - Thursday:  BMP and vancomycin trough Labs - Every other week:  ESR and CRP     1,000 mg Intravenous Every 12 hours 12/12/16 1705 12/12/16 1708      Medications:  Scheduled: . cholecalciferol  2,000 Units Oral Once per day on Sun Tue Thu Sat  .  darifenacin  15 mg Oral Daily  . [START ON 12/19/2016] enoxaparin (LOVENOX) injection  40 mg Subcutaneous Q24H  . ferrous sulfate  325 mg Oral TID PC  . gabapentin  300 mg Oral TID  . polyethylene glycol  17 g Oral BID    Objective: Vital signs in last 24 hours: Temp:  [97.9 F (36.6 C)-98.7 F (37.1 C)] 97.9 F (36.6 C) (07/26 0520) Pulse Rate:  [90-98] 90 (07/26 0520) Resp:  [14-16] 16 (07/26 0520) BP: (111-128)/(54-68) 128/68 (07/26 0520) SpO2:  [96 %-99 %] 96 % (07/26 0520)   General appearance: alert, cooperative and no distress Resp: clear to auscultation bilaterally Chest wall: R upper chest port is clean, erythema around shoulder.  Cardio: regular rate and rhythm GI: normal findings: bowel sounds normal and soft, non-tender Extremities: RLE dressed. edema in R vs L LE  Lab Results  Recent Labs  12/17/16 0436 12/18/16 0451  WBC 2.9* 4.4  HGB 9.1* 8.9*  HCT 28.4* 27.5*  NA 138 138  K 3.9 3.7  CL 104 105  CO2 27 27  BUN 17 16  CREATININE 0.52 0.48   Liver Panel No results for input(s): PROT, ALBUMIN, AST, ALT, ALKPHOS, BILITOT, BILIDIR, IBILI  in the last 72 hours. Sedimentation Rate No results for input(s): ESRSEDRATE in the last 72 hours. C-Reactive Protein No results for input(s): CRP in the last 72 hours.  Microbiology: Recent Results (from the past 240 hour(s))  Culture, blood (routine x 2)     Status: None   Collection Time: 12/12/16  3:49 PM  Result Value Ref Range Status   Specimen Description BLOOD PORT  Final   Special Requests   Final    BOTTLES DRAWN AEROBIC AND ANAEROBIC Blood Culture adequate volume   Culture   Final    NO GROWTH 5 DAYS Performed at [facility], [address] [place], [place] [address]    Report Status 12/17/2016 FINAL  Final  Culture, blood (routine x 2)     Status: None   Collection Time: 12/12/16  3:54 PM  Result Value Ref Range Status   Specimen Description BLOOD BLOOD RIGHT ARM  Final   Special Requests    Final    BOTTLES DRAWN AEROBIC AND ANAEROBIC Blood Culture adequate volume   Culture   Final    NO GROWTH 5 DAYS Performed at [facility], [place] [address] [place], [place] [address]    Report Status 12/17/2016 FINAL  Final  Surgical pcr screen     Status: None   Collection Time: 12/16/16  5:00 AM  Result Value Ref Range Status   MRSA, PCR NEGATIVE NEGATIVE Final   Staphylococcus aureus NEGATIVE NEGATIVE Final    Comment:        The Xpert SA Assay (FDA approved for NASAL specimens in patients over 68 years of age), is one component of a comprehensive surveillance program.  Test performance has been validated by [facility] for patients greater than or equal to 71 year old. It is not intended to diagnose infection nor to guide or monitor treatment.     Studies/Results: No results found.   Assessment/Plan: PJI (E faecalis)  Hemarthrosis  Breast CA with mets to chest wall On chemotherapy at [facility] (last 7-3).   Total days of antibiotics: 17 vanco (7-9), ceftriaxone (7-19)  Fever resolved  WBC improved today Continue vanco/ceftriaxone for 25 more days.  H/h stable Please have her f/u in ID clinic 2-3 weeks         Bobby Rumpf Infectious Diseases (pager) [phone] [email or web address] 12/18/2016, 11:39 AM  LOS: 6 days     Allergies  Allergen Reactions  . Penicillins Hives, Itching and Rash    Has patient had a PCN reaction causing immediate rash, facial/tongue/throat swelling, SOB or lightheadedness with hypotension: no Has patient had a PCN reaction causing severe rash involving mucus membranes or skin necrosis: No Has patient had a PCN reaction that required hospitalization No Has patient had a PCN reaction occurring within the last 10 years: yes If all of the above answers are "NO", then may proceed with Cephalosporin use.   Denies airway involvement   . Other Rash    STERI STRIPS - Blisters  . Tape Hives     Can tolerate paper tape    OPAT Orders Discharge antibiotics: ceftriaxone, vanco Per pharmacy protocol vanco Aim for Vancomycin trough 15-20 (unless otherwise indicated) Duration: 25 more days End Date: Jan 12, 2017  [facility] Care Per Protocol:  Labs weekly while on IV antibiotics: _x_ CBC with differential __ BMP _x_ CMP _x_ CRP _x_ ESR _x_ Vancomycin trough  Pt has port  Fax weekly labs to [phone]  Clinic Follow Up  Appt: 2-3 weeks

## 2016-12-18 NOTE — Progress Notes (Signed)
Physical Therapy Treatment Patient Details Name: [name] MRN: [id number] DOB: 22-Nov-1955 Today's Date: 12/18/2016    History of Present Illness 61 yo female admitted with anema. S/P I&D, replacement of antibiotic articulating spacer 7/24. Hx of R TKA, fall, R TK resection and spacer 12/03/16, breast cancer-chemo.    PT Comments    Pt progressing well with mobility and hopeful for dc home tomorrow.   Follow Up Recommendations  No PT follow up;Supervision - Intermittent     Equipment Recommendations  None recommended by PT    Recommendations for Other Services       Precautions / Restrictions Precautions Precautions: Fall;Knee Required Braces or Orthoses: Knee Immobilizer - Right Knee Immobilizer - Right: On when out of bed or walking Restrictions Weight Bearing Restrictions: Yes RLE Weight Bearing: Partial weight bearing RLE Partial Weight Bearing Percentage or Pounds: 50%    Mobility  Bed Mobility Overal bed mobility: Needs Assistance Bed Mobility: Supine to Sit     Supine to sit: Min guard     General bed mobility comments: Pt used leg lifter. Increased time.   Transfers Overall transfer level: Needs assistance Equipment used: Rolling walker (2 wheeled) Transfers: Sit to/from Stand Sit to Stand: Min guard         General transfer comment: cues for LE management and use of UEs to self assist  Ambulation/Gait Ambulation/Gait assistance: Min guard Ambulation Distance (Feet): 150 Feet Assistive device: Rolling walker (2 wheeled) Gait Pattern/deviations: Step-to pattern;Shuffle;Trunk flexed Gait velocity: decr   General Gait Details: VCs safety, sequence, adherence to PWB status. Slow gait speed.    Stairs            Wheelchair Mobility    Modified Rankin (Stroke Patients Only)       Balance Overall balance assessment: Needs assistance;History of Falls Sitting-balance support: Feet supported;No upper extremity supported Sitting  balance-Leahy Scale: Good       Standing balance-Leahy Scale: Fair                              Cognition Arousal/Alertness: Awake/alert Behavior During Therapy: WFL for tasks assessed/performed Overall Cognitive Status: Within Functional Limits for tasks assessed                                        Exercises      General Comments        Pertinent Vitals/Pain Pain Assessment: 0-10 Pain Score: 3  Pain Location: R LE Pain Descriptors / Indicators: Aching;Sore Pain Intervention(s): Limited activity within patient's tolerance;Monitored during session;Premedicated before session;Ice applied    Home Living                      Prior Function            PT Goals (current goals can now be found in the care plan section) Acute Rehab PT Goals Patient Stated Goal: to get better and get new knee PT Goal Formulation: With patient/family Time For Goal Achievement: 12/31/16 Potential to Achieve Goals: Good Progress towards PT goals: Progressing toward goals    Frequency    Min 5X/week      PT Plan Current plan remains appropriate    Co-evaluation              AM-PAC PT "6 Clicks" Daily Activity  Outcome  Measure  Difficulty turning over in bed (including adjusting bedclothes, sheets and blankets)?: A Little Difficulty moving from lying on back to sitting on the side of the bed? : A Little Difficulty sitting down on and standing up from a chair with arms (e.g., wheelchair, bedside commode, etc,.)?: A Little Help needed moving to and from a bed to chair (including a wheelchair)?: A Little Help needed walking in hospital room?: A Little Help needed climbing 3-5 steps with a railing? : A Little 6 Click Score: 18    End of Session Equipment Utilized During Treatment: Gait belt Activity Tolerance: Patient tolerated treatment well Patient left: in chair;with call bell/phone within reach;with family/visitor present Nurse  Communication: Mobility status PT Visit Diagnosis: Difficulty in walking, not elsewhere classified (R26.2);Muscle weakness (generalized) (M62.81)     Time: 2505-3976 PT Time Calculation (min) (ACUTE ONLY): 20 min  Charges:  $Gait Training: 8-22 mins                    G Codes:       Pg [phone]    Nathanyel Defenbaugh 12/18/2016, 12:57 PM

## 2016-12-18 NOTE — Progress Notes (Signed)
PHARMACY CONSULT NOTE FOR:  VANCOMYCIN AND CEFTRIAXONE  OUTPATIENT  PARENTERAL ANTIBIOTIC THERAPY (OPAT)  Indication: Prosthetic joint infection Regimen: Vancomycin 1 gram IV q12h,  Ceftriaxone 2 grams IV q24h End date:  01/12/17  IV antibiotic discharge orders are pended. To discharging provider:  please sign these orders via discharge navigator,  Select New Orders & click on the button choice - Manage This Unsigned Work.    Clayburn Pert, PharmD, BCPS Pager: [phone] 12/18/2016  1:49 PM

## 2016-12-18 NOTE — Care Management Note (Signed)
Case Management Note  Patient Details  Name: [name] MRN: [id number] Date of Birth: 03-Jun-1955  Subjective/Objective:  61 y/o f admitted w/Acute blood loss anemia. From home. [facility] already following for long term iv abx-[facility] rep Kim aware of HHRN-iv abx instruction. Has wound vac in place for d/c home w/wound vac-[facility] rep Rickie already following for home [facility] wound vac-await forms from [place] for signature for home [employer] wound vac.                 Action/Plan:d/c plan home w/HHC/iv abx/wound vac.   Expected Discharge Date:   (unknown)               Expected Discharge Plan:  [place]  In-House Referral:     Discharge planning Services  CM Consult  Post Acute Care Choice:    Choice offered to:  Patient  DME Arranged:  Vac DME Agency:  [facility]  HH Arranged:  RN, PT, IV Antibiotics HH Agency:  [facility]  Status of Service:  In process, will continue to follow  If discussed at Long Length of Stay Meetings, dates discussed:    Additional Comments:  Dessa Phi, RN 12/18/2016, 1:41 PM

## 2016-12-18 NOTE — Progress Notes (Signed)
Have confirmed w/orthopedic-[facility] wound vac is chosen to home-Spoke to [employer] rep Ricki-she is aware & awaiting the [employer] form to be sigend by orthopedic-I have spoken to orthopedic office-Courtney & Mat aware to fax back signed [facility] from for home wound vac. Awaiting signed [facility] wound vac form.

## 2016-12-18 NOTE — Progress Notes (Signed)
PROGRESS NOTE                                                                                                                                                                                                             Patient Demographics:    [name], is a 61 y.o. female, DOB - [birth year], VOH:[id number]  Admit date - 12/12/2016   Admitting Physician Reyne Dumas, MD  Outpatient Primary MD for the patient is Birdie Riddle Aundra Millet, MD  LOS - 6  Outpatient Specialists: Oncologist at [facility] Complaint  Patient presents with  . Abnormal Lab       Brief Narrative   61 year old female with history of breast cancer followed at [facility] by Dr. Delice Bison, status post recent chemotherapy on 11/25/2016, left knee arthroplasty in 2009 and then right TKA in 69/4854 complicated by a fall.  She underwent 3 I&D since November 2018. She was on oral cephalexin but had persistent drainage with cellulitis and was treated with short course of vancomycin and aztreonam and then transitioned to oral ciprofloxacin. However the wound continued to drain and was readmitted in January 2018. She underwent I&D and poorly exchange and VAC placement with joint culture growing Enterococcus faecalis and copious 6 weeks of IV vancomycin then transitioned to oral amoxicillin but developed hives on it. She was then transitioned to oral linezolid for about 6 weeks which she stopped on her on with concerns for side effects. She required surgical exploration of the wound by Dr. Marla Roe in March 2018 and did well but developed a traumatic hematoma which required debridement and culture grew anaerobe. She was placed on Flagyl which was taking until 3 weeks prior to admission. She was readmitted in July 2018 and underwent resection of the right total knee arthroplasty with placement of antibiotic spacer by Dr. Alvan Dame and discharged on 12/22/2016 on IV vancomycin and  Rocephin to be completed through 01/13/2017. However patient was only receiving IV vancomycin since her discharge home for reasons unclear. Dr. Alvan Dame check her blood work and was found to have a drop in her hemoglobin (7---> 5.4) and was advised to the ED.  Patient was placed back on empiric antibiotics and underwent repeat excisional debridement of the right knee replacement and revision of her antibiotic spacer on 7/24.     Subjective:   Still has  some pain in the right knee but much better. Was able to walk in the hallway with PT yesterday   Assessment  & Plan :   Principal problem Acute blood loss anemia Suspect combination of right knee hematoma, anemia of chronic disease. Received 2 units PRBC with improvement. H&H has remained stable. Lovenox held for procedure and resumed  at 40 mg daily for at least 2 weeks to avoid hematoma. Continue iron supplement.  History of DVT and anticoagulation use. Patient reports history of DVT about 8 years back. Since she was diagnosed of breast cancer started on chemotherapy per oncologist placed her on low-dose Coumadin (1 mg daily) for high risk of DVT or malignancy. Since he was on a different medication that would interact with Coumadin (I believe it was one of the antibiotics), she was switched to eliquis started to bruise frequently. She was then switched to Lovenox 70 mg daily by her oncologist.  Now reduced Lovenox dose to 40 mg daily for at least 2 weeks. (Since she is getting Lovenox for DVT prophylaxis I think this dose should be sufficient for her).  Prosthetic joint infection/hematoma Excisional debridement with replacement of antibiotic spacer. Continue IV vancomycin and Rocephin. ID consult appreciated. Likely discharge home on 6 weeks antibiotic regimen as outlined before (until 01/13/2017). Well-controlled with ID prior to discharge. No PT follow-up needed upon discharge. Wound will be evaluated by orthopedics tomorrow and if stable  possibly discharge home after that.  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 Currently On chemotherapy. Follows with oncologist at [facility].  Mitral regurgitation Appears stable. Recent 2-D echo with normal EF and no wall motion abnormality.  Code Status : Full code  Family Communication  : None at bedside  Disposition Plan  : Home after wound evaluated on 7/27  Barriers For Discharge : Improving symptoms  Consults  :   Orthopedics ID  Procedures  : Excisional debridement and replacement of antibiotic spacer in the right knee  DVT Prophylaxis  : SCDs  Lab Results  Component Value Date   PLT 199 12/18/2016    Antibiotics  :    Anti-infectives    Start     Dose/Rate Route Frequency Ordered Stop   12/16/16 1257  tobramycin (NEBCIN) powder  Status:  Discontinued       As needed 12/16/16 1257 12/16/16 1353   12/16/16 1257  vancomycin (VANCOCIN) powder  Status:  Discontinued       As needed 12/16/16 1258 12/16/16 1353   12/16/16 0959  vancomycin (VANCOCIN) IVPB 1000 mg/200 mL premix     1,000 mg 200 mL/hr over 60 Minutes Intravenous On call to O.R. 12/16/16 0959 12/16/16 1126   12/12/16 2100  vancomycin (VANCOCIN) IVPB 1000 mg/200 mL premix     1,000 mg 200 mL/hr over 60 Minutes Intravenous Every 12 hours 12/12/16 1551     12/12/16 1830  cefTRIAXone (ROCEPHIN) 2 g in dextrose 5 % 50 mL IVPB     2 g 100 mL/hr over 30 Minutes Intravenous Every 24 hours 12/12/16 1822     12/12/16 1715  cefTRIAXone (ROCEPHIN) IVPB  Status:  Discontinued    Comments:  Indication:  Prosthetic Joint Infection Last Day of Therapy:  01/13/17 Labs - Once weekly:  CBC/D and BMP, Labs - Every other week:  ESR and CRP     2 g Intravenous Every 24 hours 12/12/16 1705 12/12/16 1710  12/12/16 1715  vancomycin IVPB  Status:  Discontinued    Comments:  Indication:  Prosthetic Joint Infection Last Day  of Therapy:  01/13/17 Labs - _0  ml     Physical Exam Gen.: Middle aged female not in distress HEENT: Moist mucosa, supple neck Chest: Clear bilaterally CVS: Normal S1 and S2, 3/6 systolic murmur GI: Soft, nondistended, nontender Musculoskeletal: Warm, dressing over right knee with wound VAC, Hemovac removed     Data Review:    CBC  Recent Labs Lab 12/12/16 1325  12/14/16 0515 12/15/16 0437 12/16/16 0404 12/17/16 0436 12/18/16 0451  WBC 6.2  < > 6.1 6.3 5.2 2.9* 4.4  HGB 6.1*  < > 8.9* 9.0* 9.1* 9.1* 8.9*  HCT 19.2*  < > 28.0* 28.1* 28.8* 28.4* 27.5*  PLT 231  < > 223 222 215 196 199  MCV 96.0  < > 95.2 96.2 97.6 94.7 94.2  MCH 30.5  < > 30.3 30.8 30.8 30.3 30.5  MCHC 31.8  < > 31.8 32.0 31.6 32.0 32.4  RDW 23.2*  < > 23.2* 23.7* 24.2* 23.2* 23.7*  LYMPHSABS 1.0  --   --   --   --   --   --   MONOABS 0.4  --   --   --   --   --   --   EOSABS 0.1  --   --   --   --   --   --   BASOSABS 0.0  --   --   --   --   --   --   < > = values in this interval not displayed.  Chemistries   Recent Labs Lab 12/12/16 1325  12/13/16 0549 12/13/16 1300 12/14/16 0515 12/15/16 0437 12/16/16 0404 12/17/16 0436 12/18/16 0451  NA 135 134*  --  138 132* 134* 138 138  K 3.6 3.5  --  3.8 3.8 3.8 3.9 3.7  CL 99* 97*  --  102 99* 100* 104 105  CO2 28 27  --  _1 GLUCOSE 99 101*  --  101* 106* 106* 121* 91  BUN 13 8  --  _2 16  CREATININE 0.62 0.63  --  0.63 0.63 0.62 0.52 0.48  CALCIUM 8.5* 8.3*  --  8.7* 8.5* 8.4* 8.1* 8.1*  MG 1.8  --   --   --   --   --   --   --   AST 46* 46*  --   --  43*  --   --   --   ALT 24 23  --   --  20  --   --   --   ALKPHOS 121 127*  --   --  113  --   --   --   BILITOT 1.7* 2.2* 1.6*  --  1.5*  --   --   --    ------------------------------------------------------------------------------------------------------------------ No results for input(s): CHOL, HDL, LDLCALC, TRIG, CHOLHDL, LDLDIRECT in the last 72 hours.  No results found for: HGBA1C ------------------------------------------------------------------------------------------------------------------ No results for input(s): TSH, T4TOTAL, T3FREE, THYROIDAB in the last 72 hours.  Invalid input(s): FREET3 ------------------------------------------------------------------------------------------------------------------ No results for input(s): VITAMINB12, FOLATE, FERRITIN, TIBC, IRON, RETICCTPCT in the last 72 hours.  Coagulation profile  Recent Labs Lab 12/12/16 1325  INR 1.08    No results for input(s): DDIMER in the last 72 hours.  Cardiac Enzymes No results for input(s): CKMB, TROPONINI, MYOGLOBIN in the last 168 hours.  Invalid input(s): CK ------------------------------------------------------------------------------------------------------------------    Component Value Date/Time   BNP 42.0 02/17/2008 1122    Inpatient Medications  Scheduled Meds: . cholecalciferol  2,000 Units Oral Once per day on Sun Tue Thu Sat  . darifenacin  15 mg Oral Daily  . [START ON 12/19/2016]  enoxaparin (LOVENOX) injection  40 mg Subcutaneous Q24H  . ferrous sulfate  325 mg Oral TID PC  . gabapentin  300 mg Oral TID  . polyethylene glycol  17 g Oral BID   Continuous Infusions: . sodium chloride 50 mL/hr at 12/17/16 1400  . cefTRIAXone (ROCEPHIN)  IV Stopped (12/17/16 1918)  . vancomycin Stopped (12/18/16 0949)   PRN Meds:.acetaminophen, HYDROmorphone (DILAUDID) injection, menthol-cetylpyridinium **OR** phenol, methocarbamol, metoCLOPramide **OR** metoCLOPramide (REGLAN) injection, ondansetron **OR** ondansetron (ZOFRAN) IV, oxyCODONE, sodium chloride flush, zolpidem  Micro Results Recent Results (from the past 240 hour(s))  Culture, blood (routine x 2)     Status: None   Collection Time: 12/12/16  3:49 PM  Result Value Ref Range Status   Specimen Description BLOOD PORT  Final   Special Requests   Final    BOTTLES DRAWN AEROBIC AND ANAEROBIC Blood Culture adequate volume   Culture   Final    NO GROWTH 5 DAYS Performed at [facility], [address], [place], [place] [address]    Report Status 12/17/2016 FINAL  Final  Culture, blood (routine x 2)     Status: None   Collection Time: 12/12/16  3:54 PM  Result Value Ref Range Status   Specimen Description BLOOD BLOOD RIGHT ARM  Final   Special Requests   Final    BOTTLES DRAWN AEROBIC AND ANAEROBIC Blood Culture adequate volume   Culture   Final    NO GROWTH 5 DAYS Performed at [facility], [place] [address] [place], [place] [address]    Report Status 12/17/2016 FINAL  Final  Surgical pcr screen     Status: None   Collection Time: 12/16/16  5:00 AM  Result Value Ref Range Status   MRSA, PCR NEGATIVE NEGATIVE Final   Staphylococcus aureus NEGATIVE NEGATIVE Final    Comment:  The Xpert SA Assay (FDA approved for NASAL specimens in patients over 58 years of age), is one component of a comprehensive surveillance program.  Test performance has been validated by [facility] for patients  greater than or equal to 23 year old. It is not intended to diagnose infection nor to guide or monitor treatment.     Radiology Reports Dg Chest Port 1 View  Result Date: 12/12/2016 CLINICAL DATA:  62 year old female with history of fever for the past 2 days. Fatigue and weakness. History breast cancer. EXAM: PORTABLE CHEST 1 VIEW COMPARISON:  Chest x-ray 05/10/2016. FINDINGS: Lung volumes are slightly low. No acute consolidative airspace disease. No pleural effusions. No evidence of pulmonary edema. However, the central pulmonary arteries are very prominent. Heart size is mildly enlarged. The patient is rotated to the right on today's exam, resulting in distortion of the mediastinal contours and reduced diagnostic sensitivity and specificity for mediastinal pathology. Right internal jugular single-lumen porta cath with tip terminating at the superior cavoatrial junction. Surgical clips noted throughout the chest wall and in the axillary regions bilaterally. IMPRESSION: 1. No definite radiographic evidence of acute cardiopulmonary disease. 2. Cardiomegaly. 3. Dilatation of the proximal pulmonary arteries, concerning for pulmonary arterial hypertension. Electronically Signed   By: Vinnie Langton M.D.   On: 12/12/2016 16:16    Time Spent in minutes  25   Louellen Molder M.D on 12/18/2016 at 11:12 AM  Between 7am to 7pm - Pager - [phone]  After 7pm go to [email or web address] - password [facility]  [facility] -  Office  [phone]

## 2016-12-19 DIAGNOSIS — D649 Anemia, unspecified: Secondary | ICD-10-CM

## 2016-12-19 DIAGNOSIS — M009 Pyogenic arthritis, unspecified: Secondary | ICD-10-CM

## 2016-12-19 LAB — CBC
HEMATOCRIT: 28.9 % — AB (ref 36.0–46.0)
HEMOGLOBIN: 9.3 g/dL — AB (ref 12.0–15.0)
MCH: 30.5 pg (ref 26.0–34.0)
MCHC: 32.2 g/dL (ref 30.0–36.0)
MCV: 94.8 fL (ref 78.0–100.0)
Platelets: 199 10*3/uL (ref 150–400)
RBC: 3.05 MIL/uL — AB (ref 3.87–5.11)
RDW: 23.1 % — AB (ref 11.5–15.5)
WBC: 3.3 10*3/uL — AB (ref 4.0–10.5)

## 2016-12-19 MED ORDER — OXYCODONE HCL 5 MG PO TABS: 5.0000 mg | ORAL_TABLET | ORAL | 0 refills | Status: DC | PRN

## 2016-12-19 MED ORDER — HEPARIN SOD (PORK) LOCK FLUSH 100 UNIT/ML IV SOLN
500.0000 [IU] | INTRAVENOUS | Status: DC | PRN
  Administered 2016-12-19: 500 [IU]

## 2016-12-19 MED ORDER — VANCOMYCIN IV (FOR PTA / DISCHARGE USE ONLY): 1000.0000 mg | Freq: Two times a day (BID) | INTRAVENOUS | 0 refills | Status: AC

## 2016-12-19 MED ORDER — CEFTRIAXONE IV (FOR PTA / DISCHARGE USE ONLY): 2.0000 g | INTRAVENOUS | 0 refills | Status: AC

## 2016-12-19 MED ORDER — METHOCARBAMOL 500 MG PO TABS: 500.0000 mg | ORAL_TABLET | Freq: Four times a day (QID) | ORAL | 0 refills | Status: DC | PRN

## 2016-12-19 MED ORDER — ENOXAPARIN SODIUM 40 MG/0.4ML ~~LOC~~ SOLN: 40.0000 mg | SUBCUTANEOUS | 0 refills | Status: DC

## 2016-12-19 NOTE — Progress Notes (Signed)
[place] office again this am(2nd request) spoke to Chris-informed of urgency to have [facility] wound vac form signed,dated, & fax back to CM office-fax#[phone]. [facility] form also on shadow chart-Nsg-Dana aware.Currently [facility]-aware of long term iv abx, & vac dsg changes. Just awaiting wound vac signature so wound vac can be processed & released in time for d/c today.

## 2016-12-19 NOTE — Progress Notes (Signed)
Wound vac will be delivered to patient's rm by 5:30p-Nurse updated. No further CM needs.

## 2016-12-19 NOTE — Progress Notes (Signed)
[facility] wound vac form signed & faxed to [employer] rep Rickie-awaiting auth & delivery process.

## 2016-12-19 NOTE — Discharge Summary (Signed)
Physician Discharge Summary  [name] JJO:[id number] DOB: Apr 05, 1956 DOA: 12/12/2016  PCP: Midge Minium, MD  Admit date: 12/12/2016 Discharge date: 12/19/2016  Admitted From: Home Disposition:  Home  Recommendations for Outpatient Follow-up:  #1 Follow up with Dr. Alvan Dame in 1 week. #2 Patient is being referred to plastic surgery Dr. Marla Roe for further evaluation of the right knee wound #3 Patient will complete her 6 week antibiotic course (IV vancomycin and ceftriaxone) on 01/12/2017. #4 patient should follow-up with ID Dr. Johnnye Sima in 3 weeks.  The following labs needs to be checked weekly while on IV antibiotics: CBC with differential, CMP, CRP, ESR and vancomycin trough (keep trough level between 15-20)   Home Health: RN Equipment/Devices: On back  Discharge Condition: Fair CODE STATUS: Full code Diet recommendation: Regular    Discharge Diagnoses:  Principal Problem:   Acute blood loss anemia  Active Problems:   Metastatic breast cancer (HCC)   Anxiety, generalized   Postoperative anemia due to acute blood loss   S/P total knee arthroplasty, right   Right knee abx spacer   Pyogenic arthritis of right knee joint ([place])  Brief narrative/history of present illness 61 year old female with history of breast cancer followed at [facility] by Dr. Delice Bison, status post recent chemotherapy on 11/25/2016, left knee arthroplasty in 2009 and then right TKA in 16/0109 complicated by a fall.  She underwent 3 I&D since November 2018. She was on oral cephalexin but had persistent drainage with cellulitis and was treated with short course of vancomycin and aztreonam and then transitioned to oral ciprofloxacin. However the wound continued to drain and was readmitted in January 2018. She underwent I&D and poorly exchange and VAC placement with joint culture growing Enterococcus faecalis and copious 6 weeks of IV vancomycin then transitioned to oral amoxicillin but developed hives on it.  She was then transitioned to oral linezolid for about 6 weeks which she stopped on her on with concerns for side effects. She required surgical exploration of the wound by Dr. Marla Roe in March 2018 and did well but developed a traumatic hematoma which required debridement and culture grew anaerobe. She was placed on Flagyl which was taking until 3 weeks prior to admission. She was readmitted in July 2018 and underwent resection of the right total knee arthroplasty with placement of antibiotic spacer by Dr. Alvan Dame and discharged on 12/22/2016 on IV vancomycin and Rocephin to be completed through 01/13/2017. However patient was only receiving IV vancomycin since her discharge home for reasons unclear. Dr. Alvan Dame check her blood work and was found to have a drop in her hemoglobin (7---> 5.4) and was advised to the ED.  Patient was placed back on empiric antibiotics and underwent repeat excisional debridement of the right knee replacement and revision of her antibiotic spacer on 7/24.  Hospital course Principal problem Acute blood loss anemia Suspect combination of right knee hematoma, anemia of chronic disease. Received 2 units PRBC with improvement. H&H has remained stable. Lovenox held for procedure and resumed  at 40 mg daily for at least 2 weeks to avoid hematoma. Continue iron supplement.  History of DVT and anticoagulation use. Patient reports history of DVT about 8 years back. Since she was diagnosed of breast cancer started on chemotherapy per oncologist placed her on low-dose Coumadin (1 mg daily) for high risk of DVT or malignancy. Since he was on a different medication that would interact with Coumadin (I believe it was one of the antibiotics), she was switched to eliquis  started to bruise frequently. She was then switched to Lovenox 70 mg daily by her oncologist.  Lovenox was now reduced to 40 mg daily for at least 2 weeks. (Since she is getting Lovenox for DVT prophylaxis I think this dose  should be sufficient for her). Patient has appointment with her oncologist in 10 days.  Prosthetic joint infection/hematoma Excisional debridement with replacement of antibiotic spacer. Continue IV vancomycin and Rocephin. ID consult appreciated. Received both IV vancomycin and Rocephin while in the hospital. discharge home on total 6 weeks antibiotic regimen as outlined before (until 01/12/2017).  -Weekly labs as outlined above. Follow up with ID in 3 weeks. No PT follow-up needed upon discharge. Patient seen by orthopedics today and wound evaluated. Okay for discharge home with outpatient follow-up.  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 Currently On chemotherapy. Follows with oncologist at [facility].  Mitral regurgitation Appears stable. Recent 2-D echo with normal EF and no wall motion abnormality.    Family Communication  :  husband at bedside  Disposition Plan  : Home   Consults  :   Orthopedics ID  Procedures  : Excisional debridement and replacement of antibiotic spacer in the right knee   Discharge Instructions  Discharge Instructions    Home infusion instructions [facility] May follow [place] Dosing Protocol; May administer Cathflo as needed to maintain patency of vascular access device.; Flushing of vascular access device: per [facility] Protocol: 0.9% NaCl pre/post medica...    Complete by:  As directed    Instructions:  May follow [place] Dosing Protocol   Instructions:  May administer Cathflo as needed to maintain patency of vascular access device.   Instructions:  Flushing of vascular access device: per [facility] Protocol: 0.9% NaCl pre/post medication administration and prn patency; Heparin 100 u/ml, 36m for implanted ports and Heparin 10u/ml, 538mfor all other central venous catheters.   Instructions:  May follow [facility] Anaphylaxis Protocol for First  Dose Administration in the home: 0.9% NaCl at 25-50 ml/hr to maintain IV access for protocol meds. Epinephrine 0.3 ml IV/IM PRN and Benadryl 25-50 IV/IM PRN s/s of anaphylaxis.   Instructions:  [facility] Coordinator (RN) to assist per patient IV care needs in the home PRN.     Allergies as of 12/19/2016      Reactions   Penicillins Hives, Itching, Rash   Has patient had a PCN reaction causing immediate rash, facial/tongue/throat swelling, SOB or lightheadedness with hypotension: no Has patient had a PCN reaction causing severe rash involving mucus membranes or skin necrosis: No Has patient had a PCN reaction that required hospitalization No Has patient had a PCN reaction occurring within the last 10 years: yes If all of the above answers are "NO", then may proceed with Cephalosporin use. Denies airway involvement   Other Rash   STERI STRIPS - Blisters   Tape Hives   Can tolerate paper tape      Medication List    STOP taking these medications   metroNIDAZOLE 500 MG tablet Commonly known as:  FLAGYL     TAKE these medications   acetaminophen 500 MG tablet Commonly known as:  TYLENOL Take 2 tablets (1,000 mg total) by mouth every 8 (eight) hours.   cefTRIAXone IVPB Commonly known as:  ROCEPHIN Inject 2 g into the vein daily. Indication:  Prosthetic joint infection Last Day of Therapy:  01/12/2017  Labs - Once weekly on Mondays:  CBC/D, CMP, CRP, ESR What changed:  additional instructions   celecoxib 200 MG capsule Commonly known as:  CELEBREX Take 1 capsule (200 mg total) by mouth 2 (two) times daily. What changed:  when to take this  reasons to take this   darifenacin 15 MG 24 hr tablet Commonly known as:  ENABLEX Take 15 mg by mouth daily.   docusate sodium 100 MG capsule Commonly known as:  COLACE Take 1 capsule (100 mg total) by mouth 2 (two) times daily as needed for mild constipation. What changed:  when to take this   enoxaparin 40 MG/0.4ML  injection Commonly known as:  LOVENOX Inject 0.4 mLs (40 mg total) into the skin daily. Use or 2 weeks, then may resume regular dose. What changed:  medication strength  how much to take  when to take this  additional instructions   ferrous sulfate 325 (65 FE) MG tablet Take 1 tablet (325 mg total) by mouth 3 (three) times daily after meals.   gabapentin 300 MG capsule Commonly known as:  NEURONTIN TAKE 1 CAPSULE BY MOUTH 3 TIMES DAILY   KADCYLA IV Inject into the vein every 21 ( twenty-one) days.   methocarbamol 500 MG tablet Commonly known as:  ROBAXIN Take 1 tablet (500 mg total) by mouth every 6 (six) hours as needed for muscle spasms.   ondansetron 8 MG tablet Commonly known as:  ZOFRAN Take 1 tablet (8 mg total) by mouth every 8 (eight) hours as needed for nausea.   oxyCODONE 5 MG immediate release tablet Commonly known as:  Oxy IR/ROXICODONE Take 1-3 tablets (5-15 mg total) by mouth every 4 (four) hours as needed for severe pain.   polyethylene glycol packet Commonly known as:  MIRALAX / GLYCOLAX Take 17 g by mouth 2 (two) times daily.   vancomycin IVPB Inject 1,000 mg into the vein every 12 (twelve) hours. Indication:  Prosthetic joint infection Last Day of Therapy:  01/12/17 Labs - Weekly on Mondays: CBC/D, CMP, CRP, ESR Labs - Twice weekly on Mon, Thurs - vancomycin trough What changed:  additional instructions   Vitamin D3 2000 units Tabs Take 1 tablet by mouth 4 (four) times a week.   zolpidem 5 MG tablet Commonly known as:  AMBIEN TAKE 1 TABLET BY MOUTH ONCE DAILY AT BEDTIME AS NEEDED FOR SLEEP            Home Infusion Instuctions        Start     Ordered   12/19/16 0000  Home infusion instructions [facility] May follow [place] Dosing Protocol; May administer Cathflo as needed to maintain patency of vascular access device.; Flushing of vascular access device: per [facility] Protocol: 0.9% NaCl pre/post medica...    Question Answer Comment   Instructions May follow [place] Dosing Protocol   Instructions May administer Cathflo as needed to maintain patency of vascular access device.   Instructions Flushing of vascular access device: per [facility] Protocol: 0.9% NaCl pre/post medication administration and prn patency; Heparin 100 u/ml, 79m for implanted ports and Heparin 10u/ml, 558mfor all other central venous catheters.   Instructions May follow [facility] Anaphylaxis Protocol for First Dose Administration in the home: 0.9% NaCl at 25-50 ml/hr to maintain IV access for protocol meds. Epinephrine 0.3 ml IV/IM PRN and Benadryl 25-50 IV/IM PRN s/s of anaphylaxis.   Instructions [facility] Infusion Coordinator (RN) to assist per patient IV care needs in the home  PRN.      12/19/16 0758       Durable Medical Equipment        Start     Ordered   12/18/16 0856  For home use only DME Negative pressure wound device  Once    Question Answer Comment  Frequency of dressing change 3 times per week   Length of need 3 Months   Dressing type Foam   Amount of suction 125 mm/Hg   Pressure application Continuous pressure   Supplies 10 canisters and 15 dressings per month for duration of therapy      12/18/16 0855     Follow-up Information    Paralee Cancel, MD. Schedule an appointment as soon as possible for a visit on 12/26/2016.   Specialty:  Orthopedic Surgery Why:  Bring supplies to change the wound vac. Contact information: [address] [place] [address] [phone]        Wallace Going, DO Follow up in 1 week(s).   Specialty:  Plastic Surgery Contact information: [address] [place] [address] [phone]          Allergies  Allergen Reactions  . Penicillins Hives, Itching and Rash    Has patient had a PCN reaction causing immediate rash, facial/tongue/throat swelling, SOB or lightheadedness with hypotension: no Has patient had a PCN reaction causing severe rash involving  mucus membranes or skin necrosis: No Has patient had a PCN reaction that required hospitalization No Has patient had a PCN reaction occurring within the last 10 years: yes If all of the above answers are "NO", then may proceed with Cephalosporin use.   Denies airway involvement   . Other Rash    STERI STRIPS - Blisters  . Tape Hives    Can tolerate paper tape      Procedures/Studies: Dg Chest Port 1 View  Result Date: 12/12/2016 CLINICAL DATA:  61 year old female with history of fever for the past 2 days. Fatigue and weakness. History breast cancer. EXAM: PORTABLE CHEST 1 VIEW COMPARISON:  Chest x-ray 05/10/2016. FINDINGS: Lung volumes are slightly low. No acute consolidative airspace disease. No pleural effusions. No evidence of pulmonary edema. However, the central pulmonary arteries are very prominent. Heart size is mildly enlarged. The patient is rotated to the right on today's exam, resulting in distortion of the mediastinal contours and reduced diagnostic sensitivity and specificity for mediastinal pathology. Right internal jugular single-lumen porta cath with tip terminating at the superior cavoatrial junction. Surgical clips noted throughout the chest wall and in the axillary regions bilaterally. IMPRESSION: 1. No definite radiographic evidence of acute cardiopulmonary disease. 2. Cardiomegaly. 3. Dilatation of the proximal pulmonary arteries, concerning for pulmonary arterial hypertension. Electronically Signed   By: Vinnie Langton M.D.   On: 12/12/2016 16:16       Subjective: Feels better overall. Right knee pain improving.  Discharge Exam: Vitals:   12/18/16 2059 12/19/16 0557  BP: 116/67 125/63  Pulse: 98 91  Resp: 16 18  Temp: 98.4 F (36.9 C) 98.5 F (36.9 C)   Vitals:   12/18/16 0520 12/18/16 1502 12/18/16 2059 12/19/16 0557  BP: 128/68 102/61 116/67 125/63  Pulse: 90 97 98 91  Resp: 16 18 16 18   Temp: 97.9 F (36.6 C) 98.1 F (36.7 C) 98.4 F (36.9 C)  98.5 F (36.9 C)  TempSrc: Oral Axillary Oral Oral  SpO2: 96% 96% 95% 98%  Weight:      Height:  Gen.: Middle aged female not in distress HEENT: Moist mucosa, supple neck Chest: Clear to auscultation bilaterally CVS: Normal S1 and S2, 3/6 systolic murmur GI: Soft, nondistended, nontender Musculoskeletal: Warm, 18 dressing over right knee with wound VAC,     The results of significant diagnostics from this hospitalization (including imaging, microbiology, ancillary and laboratory) are listed below for reference.     Microbiology: Recent Results (from the past 240 hour(s))  Culture, blood (routine x 2)     Status: None   Collection Time: 12/12/16  3:49 PM  Result Value Ref Range Status   Specimen Description BLOOD PORT  Final   Special Requests   Final    BOTTLES DRAWN AEROBIC AND ANAEROBIC Blood Culture adequate volume   Culture   Final    NO GROWTH 5 DAYS Performed at [facility], [address] [place], [place] [address]    Report Status 12/17/2016 FINAL  Final  Culture, blood (routine x 2)     Status: None   Collection Time: 12/12/16  3:54 PM  Result Value Ref Range Status   Specimen Description BLOOD BLOOD RIGHT ARM  Final   Special Requests   Final    BOTTLES DRAWN AEROBIC AND ANAEROBIC Blood Culture adequate volume   Culture   Final    NO GROWTH 5 DAYS Performed at [facility], [address], [place], [place] [address]    Report Status 12/17/2016 FINAL  Final  Surgical pcr screen     Status: None   Collection Time: 12/16/16  5:00 AM  Result Value Ref Range Status   MRSA, PCR NEGATIVE NEGATIVE Final   Staphylococcus aureus NEGATIVE NEGATIVE Final    Comment:        The Xpert SA Assay (FDA approved for NASAL specimens in patients over 67 years of age), is one component of a comprehensive surveillance program.  Test performance has been validated by [facility] for patients greater than or equal to 62 year old. It is not intended to  diagnose infection nor to guide or monitor treatment.      Labs: BNP (last 3 results) No results for input(s): BNP in the last 8760 hours. Basic Metabolic Panel:  Recent Labs Lab 12/12/16 1325  12/14/16 0515 12/15/16 0437 12/16/16 0404 12/17/16 0436 12/18/16 0451  NA 135  < > 138 132* 134* 138 138  K 3.6  < > 3.8 3.8 3.8 3.9 3.7  CL 99*  < > 102 99* 100* 104 105  CO2 28  < > 28 27 27 27 27   GLUCOSE 99  < > 101* 106* 106* 121* 91  BUN 13  < > 12 16 13 17 16   CREATININE 0.62  < > 0.63 0.63 0.62 0.52 0.48  CALCIUM 8.5*  < > 8.7* 8.5* 8.4* 8.1* 8.1*  MG 1.8  --   --   --   --   --   --   < > = values in this interval not displayed. Liver Function Tests:  Recent Labs Lab 12/12/16 1325 12/13/16 0549 12/13/16 1300 12/15/16 0437  AST 46* 46*  --  43*  ALT 24 23  --  20  ALKPHOS 121 127*  --  113  BILITOT 1.7* 2.2* 1.6* 1.5*  PROT 6.7 6.7  --  6.8  ALBUMIN 2.8* 2.7*  --  2.7*   No results for input(s): LIPASE, AMYLASE in the last 168 hours. No results for input(s): AMMONIA in the last 168  hours. CBC:  Recent Labs Lab 12/12/16 1325  12/15/16 0437 12/16/16 0404 12/17/16 0436 12/18/16 0451 12/19/16 0512  WBC 6.2  < > 6.3 5.2 2.9* 4.4 3.3*  NEUTROABS 4.7  --   --   --   --   --   --   HGB 6.1*  < > 9.0* 9.1* 9.1* 8.9* 9.3*  HCT 19.2*  < > 28.1* 28.8* 28.4* 27.5* 28.9*  MCV 96.0  < > 96.2 97.6 94.7 94.2 94.8  PLT 231  < > 222 215 196 199 199  < > = values in this interval not displayed. Cardiac Enzymes: No results for input(s): CKTOTAL, CKMB, CKMBINDEX, TROPONINI in the last 168 hours. BNP: Invalid input(s): POCBNP CBG: No results for input(s): GLUCAP in the last 168 hours. D-Dimer No results for input(s): DDIMER in the last 72 hours. Hgb A1c No results for input(s): HGBA1C in the last 72 hours. Lipid Profile No results for input(s): CHOL, HDL, LDLCALC, TRIG, CHOLHDL, LDLDIRECT in the last 72 hours. Thyroid function studies No results for input(s): TSH,  T4TOTAL, T3FREE, THYROIDAB in the last 72 hours.  Invalid input(s): FREET3 Anemia work up No results for input(s): VITAMINB12, FOLATE, FERRITIN, TIBC, IRON, RETICCTPCT in the last 72 hours. Urinalysis    Component Value Date/Time   COLORURINE YELLOW 12/12/2016 1533   APPEARANCEUR CLEAR 12/12/2016 1533   LABSPEC 1.010 12/12/2016 1533   LABSPEC 1.005 02/17/2008 1122   PHURINE 7.0 12/12/2016 1533   GLUCOSEU NEGATIVE 12/12/2016 1533   HGBUR NEGATIVE 12/12/2016 1533   HGBUR trace-intact 11/22/2010 0835   BILIRUBINUR NEGATIVE 12/12/2016 1533   BILIRUBINUR negative 02/25/2012 0859   BILIRUBINUR n 09/19/2011 1505   BILIRUBINUR Negative 02/17/2008 1122   KETONESUR NEGATIVE 12/12/2016 1533   PROTEINUR NEGATIVE 12/12/2016 1533   UROBILINOGEN 1.0 02/01/2015 1654   NITRITE NEGATIVE 12/12/2016 1533   LEUKOCYTESUR NEGATIVE 12/12/2016 1533   LEUKOCYTESUR Moderate 02/17/2008 1122   Sepsis Labs Invalid input(s): PROCALCITONIN,  WBC,  LACTICIDVEN Microbiology Recent Results (from the past 240 hour(s))  Culture, blood (routine x 2)     Status: None   Collection Time: 12/12/16  3:49 PM  Result Value Ref Range Status   Specimen Description BLOOD PORT  Final   Special Requests   Final    BOTTLES DRAWN AEROBIC AND ANAEROBIC Blood Culture adequate volume   Culture   Final    NO GROWTH 5 DAYS Performed at [facility], [place] [address], [place], [place] [address]    Report Status 12/17/2016 FINAL  Final  Culture, blood (routine x 2)     Status: None   Collection Time: 12/12/16  3:54 PM  Result Value Ref Range Status   Specimen Description BLOOD BLOOD RIGHT ARM  Final   Special Requests   Final    BOTTLES DRAWN AEROBIC AND ANAEROBIC Blood Culture adequate volume   Culture   Final    NO GROWTH 5 DAYS Performed at [facility], [place] [address], [place], [place] [address]    Report Status 12/17/2016 FINAL  Final  Surgical pcr screen     Status: None   Collection Time: 12/16/16  5:00  AM  Result Value Ref Range Status   MRSA, PCR NEGATIVE NEGATIVE Final   Staphylococcus aureus NEGATIVE NEGATIVE Final    Comment:        The Xpert SA Assay (FDA approved for NASAL specimens in patients over 67 years of age), is one component of a comprehensive surveillance program.  Test performance has been validated by [facility] for patients greater than or equal to 25 year old. It is not intended to diagnose infection nor to guide or monitor treatment.      Time coordinating discharge: Over 30 minutes  SIGNED:   Louellen Molder, MD  [facility] 12/19/2016, 11:20 AM Pager   If 7PM-7AM, please contact night-coverage [email or web address] Password [email or web address]

## 2016-12-19 NOTE — Progress Notes (Signed)
Received call from nurse-per patient spoke to insurance company that has already authorized the wound vac-just needs to fax forms to:[phone]/tel#[phone] have informed nurse that I will pass this info on to the [facility] has received info & will f/u on this info & process for expiditing wound vac to be delivered to patient here @ hospital.

## 2016-12-19 NOTE — Progress Notes (Signed)
Patient porta cath being reaccessed to be discharge with access. Patient will be receiving IV antiobiotcs at home.

## 2016-12-19 NOTE — Progress Notes (Signed)
Just received call from [facility] has been auth, & now being processed to release-I will have estimated time of arrival soon. Will continue to follow & inform nurse when vac is coming to hospital-Nurse Lancie updated.

## 2016-12-19 NOTE — Progress Notes (Signed)
Physical Therapy Treatment Patient Details Name: [name] MRN: [id number] DOB: [birth year] Today's Date: 12/19/2016    History of Present Illness 61 yo female admitted with anema. S/P I&D, replacement of antibiotic articulating spacer 7/24. Hx of R TKA, fall, R TK resection and spacer 12/03/16, breast cancer-chemo.    PT Comments    Pt progressing well with mobility and looking forward to return home.   Follow Up Recommendations  No PT follow up;Supervision - Intermittent     Equipment Recommendations  None recommended by PT    Recommendations for Other Services       Precautions / Restrictions Precautions Precautions: Fall;Knee Precaution Comments:  wound vac Required Braces or Orthoses: Knee Immobilizer - Right Knee Immobilizer - Right: On when out of bed or walking Restrictions Weight Bearing Restrictions: Yes RLE Weight Bearing: Partial weight bearing RLE Partial Weight Bearing Percentage or Pounds: 50%    Mobility  Bed Mobility               General bed mobility comments: NT - Pt OOB with nursing  Transfers Overall transfer level: Needs assistance Equipment used: Rolling walker (2 wheeled) Transfers: Sit to/from Stand Sit to Stand: Supervision         General transfer comment: Pt self-cues for LE management and use of UEs to self assist  Ambulation/Gait Ambulation/Gait assistance: Min guard;Supervision Ambulation Distance (Feet): 200 Feet Assistive device: Rolling walker (2 wheeled) Gait Pattern/deviations: Step-to pattern;Shuffle;Trunk flexed Gait velocity: decr Gait velocity interpretation: Below normal speed for age/gender General Gait Details: VCs safety and adherence to PWB status.    Stairs         General stair comments: Pt declines - states feels comfortable with ability after very recent hospital stay  Wheelchair Mobility    Modified Rankin (Stroke Patients Only)       Balance Overall balance assessment: Needs  assistance;History of Falls Sitting-balance support: Feet supported;No upper extremity supported Sitting balance-Leahy Scale: Good     Standing balance support: No upper extremity supported Standing balance-Leahy Scale: Fair                              Cognition Arousal/Alertness: Awake/alert Behavior During Therapy: WFL for tasks assessed/performed Overall Cognitive Status: Within Functional Limits for tasks assessed                                        Exercises Total Joint Exercises Ankle Circles/Pumps: AROM;Both;20 reps;Supine Quad Sets: AROM;Both;10 reps Hip ABduction/ADduction: Right;10 reps;AAROM Straight Leg Raises: AAROM;Right;10 reps    General Comments        Pertinent Vitals/Pain Pain Assessment: 0-10 Pain Score: 3  Pain Location: R LE Pain Descriptors / Indicators: Aching;Sore Pain Intervention(s): Limited activity within patient's tolerance;Monitored during session;Premedicated before session;Ice applied    Home Living                      Prior Function            PT Goals (current goals can now be found in the care plan section) Acute Rehab PT Goals Patient Stated Goal: to get better and get new knee PT Goal Formulation: With patient Time For Goal Achievement: 12/31/16 Potential to Achieve Goals: Good Progress towards PT goals: Progressing toward goals    Frequency    Min 5X/week  PT Plan Current plan remains appropriate    Co-evaluation              AM-PAC PT "6 Clicks" Daily Activity  Outcome Measure  Difficulty turning over in bed (including adjusting bedclothes, sheets and blankets)?: A Little Difficulty moving from lying on back to sitting on the side of the bed? : A Little Difficulty sitting down on and standing up from a chair with arms (e.g., wheelchair, bedside commode, etc,.)?: A Little Help needed moving to and from a bed to chair (including a wheelchair)?: A Little Help  needed walking in hospital room?: A Little Help needed climbing 3-5 steps with a railing? : A Little 6 Click Score: 18    End of Session Equipment Utilized During Treatment: Gait belt Activity Tolerance: Patient tolerated treatment well Patient left: in chair;with call bell/phone within reach;with family/visitor present Nurse Communication: Mobility status PT Visit Diagnosis: Difficulty in walking, not elsewhere classified (R26.2);Muscle weakness (generalized) (M62.81)     Time: 3668-1594 PT Time Calculation (min) (ACUTE ONLY): 22 min  Charges:  $Gait Training: 8-22 mins                    G Codes:       Pg [phone]    Hajime Asfaw 12/19/2016, 10:33 AM

## 2016-12-20 DIAGNOSIS — Z792 Long term (current) use of antibiotics: Secondary | ICD-10-CM | POA: Diagnosis not present

## 2016-12-20 DIAGNOSIS — T8453XD Infection and inflammatory reaction due to internal right knee prosthesis, subsequent encounter: Secondary | ICD-10-CM | POA: Diagnosis not present

## 2016-12-20 DIAGNOSIS — Z791 Long term (current) use of non-steroidal anti-inflammatories (NSAID): Secondary | ICD-10-CM | POA: Diagnosis not present

## 2016-12-20 DIAGNOSIS — Z5181 Encounter for therapeutic drug level monitoring: Secondary | ICD-10-CM | POA: Diagnosis not present

## 2016-12-20 DIAGNOSIS — Z79891 Long term (current) use of opiate analgesic: Secondary | ICD-10-CM | POA: Diagnosis not present

## 2016-12-20 LAB — TYPE AND SCREEN
ABO/RH(D): A POS
ANTIBODY SCREEN: NEGATIVE
UNIT DIVISION: 0
UNIT DIVISION: 0

## 2016-12-20 LAB — BPAM RBC
BLOOD PRODUCT EXPIRATION DATE: 201808072359
BLOOD PRODUCT EXPIRATION DATE: 201808072359
ISSUE DATE / TIME: 201807241439
UNIT TYPE AND RH: 6200
Unit Type and Rh: 6200

## 2016-12-21 ENCOUNTER — Encounter: Payer: Self-pay | Admitting: Family Medicine

## 2016-12-22 DIAGNOSIS — Z792 Long term (current) use of antibiotics: Secondary | ICD-10-CM | POA: Diagnosis not present

## 2016-12-22 DIAGNOSIS — Z79891 Long term (current) use of opiate analgesic: Secondary | ICD-10-CM | POA: Diagnosis not present

## 2016-12-22 DIAGNOSIS — Z791 Long term (current) use of non-steroidal anti-inflammatories (NSAID): Secondary | ICD-10-CM | POA: Diagnosis not present

## 2016-12-22 DIAGNOSIS — Z5181 Encounter for therapeutic drug level monitoring: Secondary | ICD-10-CM | POA: Diagnosis not present

## 2016-12-22 DIAGNOSIS — T8453XD Infection and inflammatory reaction due to internal right knee prosthesis, subsequent encounter: Secondary | ICD-10-CM | POA: Diagnosis not present

## 2016-12-23 ENCOUNTER — Encounter: Payer: Self-pay | Admitting: Physical Therapy

## 2016-12-24 ENCOUNTER — Other Ambulatory Visit: Payer: Self-pay | Admitting: Pharmacist

## 2016-12-24 DIAGNOSIS — F411 Generalized anxiety disorder: Secondary | ICD-10-CM | POA: Diagnosis not present

## 2016-12-24 DIAGNOSIS — Z96651 Presence of right artificial knee joint: Secondary | ICD-10-CM | POA: Diagnosis not present

## 2016-12-24 DIAGNOSIS — Z792 Long term (current) use of antibiotics: Secondary | ICD-10-CM | POA: Diagnosis not present

## 2016-12-24 DIAGNOSIS — Z5181 Encounter for therapeutic drug level monitoring: Secondary | ICD-10-CM | POA: Diagnosis not present

## 2016-12-24 DIAGNOSIS — D62 Acute posthemorrhagic anemia: Secondary | ICD-10-CM | POA: Diagnosis not present

## 2016-12-24 DIAGNOSIS — Z452 Encounter for adjustment and management of vascular access device: Secondary | ICD-10-CM | POA: Diagnosis not present

## 2016-12-24 DIAGNOSIS — Z79891 Long term (current) use of opiate analgesic: Secondary | ICD-10-CM | POA: Diagnosis not present

## 2016-12-24 DIAGNOSIS — Z5111 Encounter for antineoplastic chemotherapy: Secondary | ICD-10-CM | POA: Diagnosis not present

## 2016-12-24 DIAGNOSIS — T8453XD Infection and inflammatory reaction due to internal right knee prosthesis, subsequent encounter: Secondary | ICD-10-CM | POA: Diagnosis not present

## 2016-12-24 DIAGNOSIS — Z791 Long term (current) use of non-steroidal anti-inflammatories (NSAID): Secondary | ICD-10-CM | POA: Diagnosis not present

## 2016-12-24 DIAGNOSIS — Z4682 Encounter for fitting and adjustment of non-vascular catheter: Secondary | ICD-10-CM | POA: Diagnosis not present

## 2016-12-24 DIAGNOSIS — C50912 Malignant neoplasm of unspecified site of left female breast: Secondary | ICD-10-CM | POA: Diagnosis not present

## 2016-12-24 DIAGNOSIS — C773 Secondary and unspecified malignant neoplasm of axilla and upper limb lymph nodes: Secondary | ICD-10-CM | POA: Diagnosis not present

## 2016-12-24 LAB — HAPTOGLOBIN: Haptoglobin: <10 mg/dL — ABNORMAL LOW (ref 34–200)

## 2016-12-25 DIAGNOSIS — Z79891 Long term (current) use of opiate analgesic: Secondary | ICD-10-CM | POA: Diagnosis not present

## 2016-12-25 DIAGNOSIS — Z792 Long term (current) use of antibiotics: Secondary | ICD-10-CM | POA: Diagnosis not present

## 2016-12-25 DIAGNOSIS — Z5181 Encounter for therapeutic drug level monitoring: Secondary | ICD-10-CM | POA: Diagnosis not present

## 2016-12-25 DIAGNOSIS — Z89529 Acquired absence of unspecified knee: Secondary | ICD-10-CM | POA: Diagnosis not present

## 2016-12-25 DIAGNOSIS — Z791 Long term (current) use of non-steroidal anti-inflammatories (NSAID): Secondary | ICD-10-CM | POA: Diagnosis not present

## 2016-12-25 DIAGNOSIS — T8453XD Infection and inflammatory reaction due to internal right knee prosthesis, subsequent encounter: Secondary | ICD-10-CM | POA: Diagnosis not present

## 2016-12-25 DIAGNOSIS — D62 Acute posthemorrhagic anemia: Secondary | ICD-10-CM | POA: Diagnosis not present

## 2016-12-26 DIAGNOSIS — T8131XD Disruption of external operation (surgical) wound, not elsewhere classified, subsequent encounter: Secondary | ICD-10-CM | POA: Diagnosis not present

## 2016-12-27 DIAGNOSIS — F411 Generalized anxiety disorder: Secondary | ICD-10-CM | POA: Diagnosis not present

## 2016-12-27 DIAGNOSIS — D62 Acute posthemorrhagic anemia: Secondary | ICD-10-CM | POA: Diagnosis not present

## 2016-12-27 DIAGNOSIS — Z79891 Long term (current) use of opiate analgesic: Secondary | ICD-10-CM | POA: Diagnosis not present

## 2016-12-27 DIAGNOSIS — C773 Secondary and unspecified malignant neoplasm of axilla and upper limb lymph nodes: Secondary | ICD-10-CM | POA: Diagnosis not present

## 2016-12-27 DIAGNOSIS — Z5111 Encounter for antineoplastic chemotherapy: Secondary | ICD-10-CM | POA: Diagnosis not present

## 2016-12-27 DIAGNOSIS — Z5181 Encounter for therapeutic drug level monitoring: Secondary | ICD-10-CM | POA: Diagnosis not present

## 2016-12-27 DIAGNOSIS — Z4682 Encounter for fitting and adjustment of non-vascular catheter: Secondary | ICD-10-CM | POA: Diagnosis not present

## 2016-12-27 DIAGNOSIS — C50912 Malignant neoplasm of unspecified site of left female breast: Secondary | ICD-10-CM | POA: Diagnosis not present

## 2016-12-27 DIAGNOSIS — Z792 Long term (current) use of antibiotics: Secondary | ICD-10-CM | POA: Diagnosis not present

## 2016-12-27 DIAGNOSIS — Z791 Long term (current) use of non-steroidal anti-inflammatories (NSAID): Secondary | ICD-10-CM | POA: Diagnosis not present

## 2016-12-27 DIAGNOSIS — Z96651 Presence of right artificial knee joint: Secondary | ICD-10-CM | POA: Diagnosis not present

## 2016-12-27 DIAGNOSIS — T8453XD Infection and inflammatory reaction due to internal right knee prosthesis, subsequent encounter: Secondary | ICD-10-CM | POA: Diagnosis not present

## 2016-12-27 DIAGNOSIS — Z452 Encounter for adjustment and management of vascular access device: Secondary | ICD-10-CM | POA: Diagnosis not present

## 2016-12-29 ENCOUNTER — Other Ambulatory Visit: Payer: Self-pay | Admitting: *Deleted

## 2016-12-29 ENCOUNTER — Encounter: Payer: Self-pay | Admitting: *Deleted

## 2016-12-29 ENCOUNTER — Other Ambulatory Visit (HOSPITAL_COMMUNITY)
Admission: AD | Admit: 2016-12-29 | Discharge: 2016-12-29 | Disposition: A | Discharge status: Home / Self Care | Payer: 59 | Source: Skilled Nursing Facility | Attending: Internal Medicine | Admitting: Internal Medicine

## 2016-12-29 DIAGNOSIS — M8610 Other acute osteomyelitis, unspecified site: Secondary | ICD-10-CM | POA: Insufficient documentation

## 2016-12-29 DIAGNOSIS — Z791 Long term (current) use of non-steroidal anti-inflammatories (NSAID): Secondary | ICD-10-CM | POA: Diagnosis not present

## 2016-12-29 DIAGNOSIS — C792 Secondary malignant neoplasm of skin: Secondary | ICD-10-CM | POA: Diagnosis not present

## 2016-12-29 DIAGNOSIS — Z792 Long term (current) use of antibiotics: Secondary | ICD-10-CM | POA: Diagnosis not present

## 2016-12-29 DIAGNOSIS — Z5181 Encounter for therapeutic drug level monitoring: Secondary | ICD-10-CM | POA: Diagnosis not present

## 2016-12-29 DIAGNOSIS — C50919 Malignant neoplasm of unspecified site of unspecified female breast: Secondary | ICD-10-CM | POA: Diagnosis not present

## 2016-12-29 DIAGNOSIS — Z79891 Long term (current) use of opiate analgesic: Secondary | ICD-10-CM | POA: Diagnosis not present

## 2016-12-29 DIAGNOSIS — R590 Localized enlarged lymph nodes: Secondary | ICD-10-CM | POA: Diagnosis not present

## 2016-12-29 DIAGNOSIS — T8453XD Infection and inflammatory reaction due to internal right knee prosthesis, subsequent encounter: Secondary | ICD-10-CM | POA: Diagnosis not present

## 2016-12-29 LAB — CBC WITH DIFFERENTIAL/PLATELET
BASOS ABS: 0.1 10*3/uL (ref 0.0–0.1)
Basophils Relative: 1 %
Eosinophils Absolute: 0.1 10*3/uL (ref 0.0–0.7)
Eosinophils Relative: 3 %
HEMATOCRIT: 33.1 % — AB (ref 36.0–46.0)
HEMOGLOBIN: 10.5 g/dL — AB (ref 12.0–15.0)
LYMPHS PCT: 16 %
Lymphs Abs: 0.7 10*3/uL (ref 0.7–4.0)
MCH: 31.7 pg (ref 26.0–34.0)
MCHC: 31.7 g/dL (ref 30.0–36.0)
MCV: 100 fL (ref 78.0–100.0)
MONO ABS: 0.6 10*3/uL (ref 0.1–1.0)
Monocytes Relative: 13 %
NEUTROS ABS: 3 10*3/uL (ref 1.7–7.7)
NEUTROS PCT: 67 %
Platelets: 220 10*3/uL (ref 150–400)
RBC: 3.31 MIL/uL — AB (ref 3.87–5.11)
RDW: 19.2 % — ABNORMAL HIGH (ref 11.5–15.5)
WBC: 4.4 10*3/uL (ref 4.0–10.5)

## 2016-12-29 LAB — BASIC METABOLIC PANEL
ANION GAP: 6 (ref 5–15)
BUN: 15 mg/dL (ref 6–20)
CHLORIDE: 100 mmol/L — AB (ref 101–111)
CO2: 28 mmol/L (ref 22–32)
Calcium: 8.6 mg/dL — ABNORMAL LOW (ref 8.9–10.3)
Creatinine, Ser: 0.73 mg/dL (ref 0.44–1.00)
GFR calc Af Amer: >60 mL/min (ref >60)
GFR calc non Af Amer: >60 mL/min (ref >60)
Glucose, Bld: 95 mg/dL (ref 65–99)
POTASSIUM: 3.8 mmol/L (ref 3.5–5.1)
SODIUM: 134 mmol/L — AB (ref 135–145)

## 2016-12-29 LAB — VANCOMYCIN, TROUGH: VANCOMYCIN TR: 12 ug/mL — AB (ref 15–20)

## 2016-12-29 NOTE — Patient Outreach (Addendum)
[facility]) Care Management  12/29/2016  MADDI COLLAR 05-01-1956 [id number]   Subjective:Telephone call to patient's home number, spoke with patient, and HIPAA verified. Discussed [facility] Care Management [facility] Transition of care follow up and patient in agreement to complete. Patient states she is doing much better, remembers speaking with this RNCM in the past, receiving home health services through [facility] for intravenous antibiotics through 01/12/17, has follow up appointment with surgeon on 01/02/17, follow up with infectious disease MD is pending call back  from MD's office, oncologist follow up on 12/30/16, and cardiologist follow up on 01/08/17.   Patient voices understanding of medical diagnosis and treatment plan.  Patient states she is accessing the following [facility] benefits: outpatient pharmacy, and hospital indemnity supplemental insurance.  States she is covered under her husband's [employer] plan.  Patient states she does not have any education material, transition of care, care coordination, disease management, disease monitoring, transportation, community resource, or pharmacy needs at this time. States she is very appreciative of the follow up and is in agreement to receive [facility] information.     Objective:Per chart review: Patient hospitalized 12/12/16 - 12/19/16 for anemia.   Patient hospitalized 12/02/16 -12/05/16 for Infected right total knee arthroplasty.  Status post EXCISIONAL TOTAL KNEE ARTHROPLASTY WITH ANTIBIOTIC SPACERS  on 12/02/16.   Status post Resection of right total knee arthroplasty, placement of antibiotic articulating spacer on 12/03/16. Patient also hospitalized 03/20/16 -03/23/16 for End-stage osteoarthrosis, valgus deformity of the right knee.Status post Right total knee arthroplasty on 03/20/16. Patient has a history of BREAST CANCER PRIMARY W/ METS TO CHEST WALL SKIN CANCER-- CHEMOTHERAPY EVERY 3 WEEKS AT [facility].   [facility] Care Management transition of care follow up completed on 12/08/16.   Assessment: Received [facility] Transition of care referral on 12/16/16. Transition of care follow up completed, no care management needs, and will proceed with case closure.  Plan:RNCM will send patient successful outreach letter, [facility] pamphlet, and magnet. RNCM will send case closure due to follow up completed / no care management needs request to [facility].   Whitleigh Garramone H. Annia Friendly, BSN, [facility] Management [facility] Telephonic CM Phone: [phone] Fax: [phone]

## 2016-12-30 DIAGNOSIS — Z171 Estrogen receptor negative status [ER-]: Secondary | ICD-10-CM | POA: Diagnosis not present

## 2016-12-30 DIAGNOSIS — C792 Secondary malignant neoplasm of skin: Secondary | ICD-10-CM | POA: Diagnosis not present

## 2016-12-30 DIAGNOSIS — I340 Nonrheumatic mitral (valve) insufficiency: Secondary | ICD-10-CM | POA: Diagnosis not present

## 2016-12-30 DIAGNOSIS — I749 Embolism and thrombosis of unspecified artery: Secondary | ICD-10-CM | POA: Diagnosis not present

## 2016-12-30 DIAGNOSIS — C50412 Malignant neoplasm of upper-outer quadrant of left female breast: Secondary | ICD-10-CM | POA: Diagnosis not present

## 2016-12-30 DIAGNOSIS — T8453XD Infection and inflammatory reaction due to internal right knee prosthesis, subsequent encounter: Secondary | ICD-10-CM | POA: Diagnosis not present

## 2016-12-30 DIAGNOSIS — R011 Cardiac murmur, unspecified: Secondary | ICD-10-CM | POA: Diagnosis not present

## 2016-12-30 DIAGNOSIS — I5020 Unspecified systolic (congestive) heart failure: Secondary | ICD-10-CM | POA: Diagnosis not present

## 2016-12-30 DIAGNOSIS — C50919 Malignant neoplasm of unspecified site of unspecified female breast: Secondary | ICD-10-CM | POA: Diagnosis not present

## 2016-12-30 DIAGNOSIS — C50912 Malignant neoplasm of unspecified site of left female breast: Secondary | ICD-10-CM | POA: Diagnosis not present

## 2016-12-31 DIAGNOSIS — Z79891 Long term (current) use of opiate analgesic: Secondary | ICD-10-CM | POA: Diagnosis not present

## 2016-12-31 DIAGNOSIS — T8453XD Infection and inflammatory reaction due to internal right knee prosthesis, subsequent encounter: Secondary | ICD-10-CM | POA: Diagnosis not present

## 2016-12-31 DIAGNOSIS — Z792 Long term (current) use of antibiotics: Secondary | ICD-10-CM | POA: Diagnosis not present

## 2016-12-31 DIAGNOSIS — Z791 Long term (current) use of non-steroidal anti-inflammatories (NSAID): Secondary | ICD-10-CM | POA: Diagnosis not present

## 2016-12-31 DIAGNOSIS — Z5181 Encounter for therapeutic drug level monitoring: Secondary | ICD-10-CM | POA: Diagnosis not present

## 2017-01-01 ENCOUNTER — Inpatient Hospital Stay: Payer: Self-pay | Admitting: Internal Medicine

## 2017-01-02 ENCOUNTER — Other Ambulatory Visit: Payer: Self-pay | Admitting: Pharmacist

## 2017-01-02 DIAGNOSIS — T8189XD Other complications of procedures, not elsewhere classified, subsequent encounter: Secondary | ICD-10-CM | POA: Diagnosis not present

## 2017-01-02 DIAGNOSIS — T8453XD Infection and inflammatory reaction due to internal right knee prosthesis, subsequent encounter: Secondary | ICD-10-CM | POA: Diagnosis not present

## 2017-01-03 DIAGNOSIS — Z89529 Acquired absence of unspecified knee: Secondary | ICD-10-CM | POA: Diagnosis not present

## 2017-01-05 DIAGNOSIS — Z5181 Encounter for therapeutic drug level monitoring: Secondary | ICD-10-CM | POA: Diagnosis not present

## 2017-01-05 DIAGNOSIS — Z79891 Long term (current) use of opiate analgesic: Secondary | ICD-10-CM | POA: Diagnosis not present

## 2017-01-05 DIAGNOSIS — Z791 Long term (current) use of non-steroidal anti-inflammatories (NSAID): Secondary | ICD-10-CM | POA: Diagnosis not present

## 2017-01-05 DIAGNOSIS — Z792 Long term (current) use of antibiotics: Secondary | ICD-10-CM | POA: Diagnosis not present

## 2017-01-05 DIAGNOSIS — T8453XD Infection and inflammatory reaction due to internal right knee prosthesis, subsequent encounter: Secondary | ICD-10-CM | POA: Diagnosis not present

## 2017-01-05 MED FILL — DARIFENACIN ER 15 MG TABLET: 15 | 30 days supply | Qty: 30 | Fill #1

## 2017-01-05 MED FILL — GABAPENTIN 300 MG CAPS: 300 | 30 days supply | Qty: 90 | Fill #6

## 2017-01-07 DIAGNOSIS — Z79891 Long term (current) use of opiate analgesic: Secondary | ICD-10-CM | POA: Diagnosis not present

## 2017-01-07 DIAGNOSIS — Z791 Long term (current) use of non-steroidal anti-inflammatories (NSAID): Secondary | ICD-10-CM | POA: Diagnosis not present

## 2017-01-07 DIAGNOSIS — Z5181 Encounter for therapeutic drug level monitoring: Secondary | ICD-10-CM | POA: Diagnosis not present

## 2017-01-07 DIAGNOSIS — Z792 Long term (current) use of antibiotics: Secondary | ICD-10-CM | POA: Diagnosis not present

## 2017-01-07 DIAGNOSIS — T8453XD Infection and inflammatory reaction due to internal right knee prosthesis, subsequent encounter: Secondary | ICD-10-CM | POA: Diagnosis not present

## 2017-01-08 ENCOUNTER — Ambulatory Visit (HOSPITAL_COMMUNITY)
Admission: RE | Admit: 2017-01-08 | Discharge: 2017-01-08 | Disposition: A | Discharge status: Home / Self Care | Payer: 59 | Source: Ambulatory Visit | Attending: Internal Medicine | Admitting: Internal Medicine

## 2017-01-08 ENCOUNTER — Encounter (HOSPITAL_COMMUNITY): Payer: Self-pay | Admitting: Internal Medicine

## 2017-01-08 VITALS — BP 122/72 | HR 107 | Wt 154.5 lb

## 2017-01-08 DIAGNOSIS — M1711 Unilateral primary osteoarthritis, right knee: Secondary | ICD-10-CM | POA: Diagnosis not present

## 2017-01-08 DIAGNOSIS — Z9013 Acquired absence of bilateral breasts and nipples: Secondary | ICD-10-CM | POA: Insufficient documentation

## 2017-01-08 DIAGNOSIS — Z8249 Family history of ischemic heart disease and other diseases of the circulatory system: Secondary | ICD-10-CM | POA: Insufficient documentation

## 2017-01-08 DIAGNOSIS — Z79899 Other long term (current) drug therapy: Secondary | ICD-10-CM | POA: Diagnosis not present

## 2017-01-08 DIAGNOSIS — Z880 Allergy status to penicillin: Secondary | ICD-10-CM | POA: Diagnosis not present

## 2017-01-08 DIAGNOSIS — Z803 Family history of malignant neoplasm of breast: Secondary | ICD-10-CM | POA: Diagnosis not present

## 2017-01-08 DIAGNOSIS — D649 Anemia, unspecified: Secondary | ICD-10-CM | POA: Insufficient documentation

## 2017-01-08 DIAGNOSIS — Z9221 Personal history of antineoplastic chemotherapy: Secondary | ICD-10-CM | POA: Insufficient documentation

## 2017-01-08 DIAGNOSIS — T8453XD Infection and inflammatory reaction due to internal right knee prosthesis, subsequent encounter: Secondary | ICD-10-CM | POA: Diagnosis not present

## 2017-01-08 DIAGNOSIS — Z89529 Acquired absence of unspecified knee: Secondary | ICD-10-CM | POA: Diagnosis not present

## 2017-01-08 DIAGNOSIS — C7989 Secondary malignant neoplasm of other specified sites: Secondary | ICD-10-CM | POA: Insufficient documentation

## 2017-01-08 DIAGNOSIS — Z5181 Encounter for therapeutic drug level monitoring: Secondary | ICD-10-CM | POA: Diagnosis not present

## 2017-01-08 DIAGNOSIS — Z792 Long term (current) use of antibiotics: Secondary | ICD-10-CM | POA: Diagnosis not present

## 2017-01-08 DIAGNOSIS — Z96651 Presence of right artificial knee joint: Secondary | ICD-10-CM | POA: Diagnosis not present

## 2017-01-08 DIAGNOSIS — Z8489 Family history of other specified conditions: Secondary | ICD-10-CM | POA: Diagnosis not present

## 2017-01-08 DIAGNOSIS — C50919 Malignant neoplasm of unspecified site of unspecified female breast: Secondary | ICD-10-CM | POA: Insufficient documentation

## 2017-01-08 DIAGNOSIS — Z888 Allergy status to other drugs, medicaments and biological substances status: Secondary | ICD-10-CM | POA: Diagnosis not present

## 2017-01-08 DIAGNOSIS — Z791 Long term (current) use of non-steroidal anti-inflammatories (NSAID): Secondary | ICD-10-CM | POA: Diagnosis not present

## 2017-01-08 DIAGNOSIS — Z79891 Long term (current) use of opiate analgesic: Secondary | ICD-10-CM | POA: Diagnosis not present

## 2017-01-08 DIAGNOSIS — D62 Acute posthemorrhagic anemia: Secondary | ICD-10-CM | POA: Diagnosis not present

## 2017-01-08 NOTE — Patient Instructions (Signed)
Echocardiogram and follow up in 3 Months

## 2017-01-08 NOTE — Progress Notes (Signed)
Patient ID: [name], female   DOB: [birth year], 61 y.o.   MRN: [id number]     Cardio-Oncology Clinic Note  PCP: Dr. 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   She is s/p bilateral mastectomies, XRT, chemo. She is currently on Kadcyla (trastuzamab-emantsine) every 3 weeks. Underwent skin flap for skim metastasis on her abdomen in 2/15   Had skin resection with chest skin flap on 02/21/15 at [facility]. Requiring wound vac. Path ok fortunately with no recurrent malignancy.  Had clots around port-a-cath so on Elqiuis.   Recently admitted from 12/12/16 - 12/19/16 for symptomatic anemia. She has underwent 3 I&D of her R TKA since Novemeber 2017. She had resection of the R TKA arthroplasty with replacement of ABX spacer by Dr. Charlann [name] 12/02/2016. Discharged on IV vancomycin and Roccphin to be completed through 01/13/2017. She was only received IV vanc for reasons unclear, and required re-excision and new ABX spacer on 12/16/2016.   She presents today for follow up. Main complaint is problem with her TKA and multiple I&D as above. Chemo on hold until until she gets hardware back in. Tentatively plan on starting chemo back on 02/03/17. Plan on tentative hardward replacement.. Last time she received chemo was 11/25/16. She denies any SOB, PND, orthopnea, lightheadedness, or dizziness.    ECHO 09/27/2013 EF 60-65% Lateral S'10.7 Grade 2 DD ECHO 02/27/2014 EF 60-65% Lateral S' 10.4  ECHO 07/14/2014  EF 60-65% Lateral S' 10.4 GLS -23.1% mild MR ECHO 04/18/2015  EF 60-65% Lateral S' 13.6 GLS -19.2% mild TR Trivial MR. RV ok ECHO 03/03/2016 EF 60-65% lateral S 11.8 GLS -19.2 poor window ECHO 11/19/2016 EF 60-65%. No strain imaging done.    Past Medical History:  Diagnosis Date  . 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 Allergic reaction 07/24/2016  . Arthritis KNEES   right knee, hx. past left knee replacemnt.  . Blood clot in vein     around Portacath right chest-tx. Eliquis about a yr., prior Lovenox.  . Blood dyscrasia   . Chronic anticoagulation HX  PORT-A-CATH CLOT   2010 - HAS BEEN ON LOVENOX SINCE THE CLOT  . Drug rash 07/24/2016  . Elevated blood pressure reading without diagnosis of hypertension    PT MONITORS HER B/P AT HOME  . Heart murmur   . 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 PONV (postoperative nausea and vomiting)    ponv likes scopolamine patch  . Skin cancer of anterior chest BREAST CANCER PRIMARY W/ METS TO CHEST WALL SKIN CANCER--  CHEMOTHERAPY EVERY 3 WEEKS AT [facility]   left 9 yrs ago, chemo every 3 weeks at [facility], last chemo july 3rd  . Status post skin flap graft    right chest wall with metastatis right anterior chest -no drainage or open wound.    Current Outpatient Prescriptions  Medication Sig Dispense Refill  . acetaminophen (TYLENOL) 500 MG tablet Take 2 tablets (1,000 mg total) by mouth every 8 (eight) hours. 30 tablet 0  . Ado-Trastuzumab Emtansine (KADCYLA IV) Inject into the vein every 21 ( twenty-one) days.    . cefTRIAXone (ROCEPHIN) IVPB Inject 2 g into the vein daily. Indication:  Prosthetic joint infection Last Day of Therapy:  01/12/2017 Labs - Once weekly  on Mondays:  CBC/D, CMP, CRP, ESR 25 Units 0  . celecoxib (CELEBREX) 200 MG capsule Take 1 capsule (200 mg total) by mouth 2 (two) times daily. (Patient taking differently: Take 200 mg by mouth daily as needed. ) 60 capsule 6  . Cholecalciferol (VITAMIN D3) 2000 UNITS TABS Take 1 tablet by mouth 4 (four) times a week.     . darifenacin (ENABLEX) 15 MG 24 hr tablet Take 15 mg by mouth daily.    [name] docusate sodium (COLACE) 100 MG capsule Take 1 capsule (100 mg total) by mouth 2 (two) times daily as needed for mild constipation. (Patient taking differently: Take 100 mg by mouth 2 (two) times daily. )  30 capsule 1  . enoxaparin (LOVENOX) 40 MG/0.4ML injection Inject 0.4 mLs (40 mg total) into the skin daily. Use or 2 weeks, then may resume regular dose. 14 Syringe 0  . ferrous sulfate 325 (65 FE) MG tablet Take 1 tablet (325 mg total) by mouth 3 (three) times daily after meals.  3  . gabapentin (NEURONTIN) 300 MG capsule TAKE 1 CAPSULE BY MOUTH 3 TIMES DAILY 90 capsule 6  . methocarbamol (ROBAXIN) 500 MG tablet Take 1 tablet (500 mg total) by mouth every 6 (six) hours as needed for muscle spasms. 40 tablet 0  . oxyCODONE (OXY IR/ROXICODONE) 5 MG immediate release tablet Take 1-3 tablets (5-15 mg total) by mouth every 4 (four) hours as needed for severe pain. 90 tablet 0  . polyethylene glycol (MIRALAX / GLYCOLAX) packet Take 17 g by mouth 2 (two) times daily. 14 each 0  . vancomycin IVPB Inject 1,000 mg into the vein every 12 (twelve) hours. Indication:  Prosthetic joint infection Last Day of Therapy:  01/12/17 Labs - Weekly on Mondays: CBC/D, CMP, CRP, ESR Labs - Twice weekly on Mon, Thurs - vancomycin trough 25 Units 0  . zolpidem (AMBIEN) 5 MG tablet TAKE 1 TABLET BY MOUTH ONCE DAILY AT BEDTIME AS NEEDED FOR SLEEP 30 tablet 5  . ondansetron (ZOFRAN) 8 MG tablet Take 1 tablet (8 mg total) by mouth every 8 (eight) hours as needed for nausea. (Patient not taking: Reported on 01/08/2017) 20 tablet 6   No current facility-administered medications for this encounter.     Allergies  Allergen Reactions  . Penicillins Hives, Itching and Rash    Has patient had a PCN reaction causing immediate rash, facial/tongue/throat swelling, SOB or lightheadedness with hypotension: no Has patient had a PCN reaction causing severe rash involving mucus membranes or skin necrosis: No Has patient had a PCN reaction that required hospitalization No Has patient had a PCN reaction occurring within the last 10 years: yes If all of the above answers are "NO", then may proceed with Cephalosporin use.   Denies airway  involvement   . Other Rash    STERI STRIPS - Blisters  . Tape Hives    Can tolerate paper tape    Social History   Social History  . Marital status: Married    Spouse name: N/A  . Number of children: 3  . Years of education: N/A   Occupational History  . 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 Smoking status: Never Smoker  . Smokeless tobacco: Never Used  . Alcohol use No  . Drug use: No  . Sexual activity: Not on file   Other Topics Concern  . 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          Family History  Problem Relation Age of Onset  . Heart disease Mother        due to mitral valve regurgiation,   . Cancer Mother        breast  . Allergic rhinitis Mother   . Parkinsonism Father   . Allergic rhinitis Father   . Migraines Father   . Alcohol abuse Paternal Grandfather   . Angioedema Neg Hx   . Asthma Neg Hx   . Eczema Neg Hx     PHYSICAL EXAM: Vitals:   01/08/17 1152  BP: 122/72  Pulse: (!) 107  SpO2: 99%  Weight: 154 lb 8 oz (70.1 kg)   Wt Readings from Last 3 Encounters:  01/08/17 154 lb 8 oz (70.1 kg)  12/12/16 155 lb (70.3 kg)  12/02/16 155 lb (70.3 kg)    General: Well appearing. No resp difficulty. HEENT: Normal Neck: Supple. JVP 5-6. Carotids 2+ bilat; no bruits. No thyromegaly or nodule noted. Cor: PMI nondisplaced. RRR, No M/G/R noted Lungs: CTAB, normal effort. Abdomen: Soft, non-tender, non-distended, no HSM. No bruits or masses. +BS  Extremities: No cyanosis, clubbing, rash, R and LLE no edema. R leg in large brace.  Neuro: Alert & orientedx3, cranial nerves grossly intact. moves all 4 extremities w/o difficulty. Affect pleasant    ASSESSMENT & PLAN: 1. 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 Anemia 3. R Knee Osteoarthritis- - s/p R TKA. Now s/p  multiple I&D and excision of TKA with ABX spacer placed x 2.   Doing very well from HF perspective. Steward Drone has been on hold with Knee surgeries. Plan to resume 02/03/17. Will have Dr. Haroldine Laws review previous Echo and plan on RTC and repeating Echo in 3 months (after Kadcyla resumed).  Shirley Friar, PA-C  11:57 AM

## 2017-01-09 ENCOUNTER — Encounter: Payer: Self-pay | Admitting: Internal Medicine

## 2017-01-09 ENCOUNTER — Other Ambulatory Visit: Payer: Self-pay | Admitting: Internal Medicine

## 2017-01-09 DIAGNOSIS — T8189XD Other complications of procedures, not elsewhere classified, subsequent encounter: Secondary | ICD-10-CM | POA: Diagnosis not present

## 2017-01-09 DIAGNOSIS — T8453XD Infection and inflammatory reaction due to internal right knee prosthesis, subsequent encounter: Secondary | ICD-10-CM | POA: Diagnosis not present

## 2017-01-09 MED ORDER — LINEZOLID 600 MG PO TABS: 600.0000 mg | ORAL_TABLET | Freq: Two times a day (BID) | ORAL | 0 refills | Status: DC

## 2017-01-09 MED FILL — LINEZOLID 600 MG TABLET: 600 | 15 days supply | Qty: 30 | Fill #0

## 2017-01-11 DIAGNOSIS — Z89529 Acquired absence of unspecified knee: Secondary | ICD-10-CM | POA: Diagnosis not present

## 2017-01-12 DIAGNOSIS — Z791 Long term (current) use of non-steroidal anti-inflammatories (NSAID): Secondary | ICD-10-CM | POA: Diagnosis not present

## 2017-01-12 DIAGNOSIS — Z5181 Encounter for therapeutic drug level monitoring: Secondary | ICD-10-CM | POA: Diagnosis not present

## 2017-01-12 DIAGNOSIS — Z792 Long term (current) use of antibiotics: Secondary | ICD-10-CM | POA: Diagnosis not present

## 2017-01-12 DIAGNOSIS — Z79891 Long term (current) use of opiate analgesic: Secondary | ICD-10-CM | POA: Diagnosis not present

## 2017-01-12 DIAGNOSIS — T8453XD Infection and inflammatory reaction due to internal right knee prosthesis, subsequent encounter: Secondary | ICD-10-CM | POA: Diagnosis not present

## 2017-01-13 ENCOUNTER — Telehealth: Payer: Self-pay | Admitting: Internal Medicine

## 2017-01-13 DIAGNOSIS — Z96651 Presence of right artificial knee joint: Secondary | ICD-10-CM | POA: Diagnosis not present

## 2017-01-13 DIAGNOSIS — T8453XD Infection and inflammatory reaction due to internal right knee prosthesis, subsequent encounter: Secondary | ICD-10-CM | POA: Diagnosis not present

## 2017-01-13 DIAGNOSIS — Z792 Long term (current) use of antibiotics: Secondary | ICD-10-CM | POA: Diagnosis not present

## 2017-01-13 DIAGNOSIS — T8189XD Other complications of procedures, not elsewhere classified, subsequent encounter: Secondary | ICD-10-CM | POA: Diagnosis not present

## 2017-01-13 DIAGNOSIS — Z791 Long term (current) use of non-steroidal anti-inflammatories (NSAID): Secondary | ICD-10-CM | POA: Diagnosis not present

## 2017-01-13 DIAGNOSIS — Z79891 Long term (current) use of opiate analgesic: Secondary | ICD-10-CM | POA: Diagnosis not present

## 2017-01-13 DIAGNOSIS — Z5181 Encounter for therapeutic drug level monitoring: Secondary | ICD-10-CM | POA: Diagnosis not present

## 2017-01-13 NOTE — Telephone Encounter (Signed)
I reviewed the potential interaction between oxycodone and linezolid with our pharmacist, Onnie Boer and then called [name]. We did not believe that this is likely to be a significant interaction. She only took 1 oxycodone today after the VAC wound just thing was placed back on her knee wound. She believes she will be back to taking acetaminophen tomorrow. I instructed her to continue linezolid at least until her follow-up visit with me in one week. I asked her to call me if she had any problems tolerating her linezolid.  ----- Message -----  From: Thedora Hinders  Sent: 01/13/2017  3:41 PM  To: [facility] Clinical Pool  Subject: RE: RE: RE: Non-Urgent Medical Question       Dr Megan Salon,   According to the medication report I can't take oxycodone with linezolid. Is there any thing I can take? Thanks [name]

## 2017-01-19 DIAGNOSIS — T8131XD Disruption of external operation (surgical) wound, not elsewhere classified, subsequent encounter: Secondary | ICD-10-CM | POA: Diagnosis not present

## 2017-01-19 DIAGNOSIS — T8189XD Other complications of procedures, not elsewhere classified, subsequent encounter: Secondary | ICD-10-CM | POA: Diagnosis not present

## 2017-01-20 ENCOUNTER — Ambulatory Visit (INDEPENDENT_AMBULATORY_CARE_PROVIDER_SITE_OTHER): Payer: 59 | Admitting: Internal Medicine

## 2017-01-20 ENCOUNTER — Encounter: Payer: Self-pay | Admitting: Internal Medicine

## 2017-01-20 DIAGNOSIS — T8453XD Infection and inflammatory reaction due to internal right knee prosthesis, subsequent encounter: Secondary | ICD-10-CM

## 2017-01-20 NOTE — Patient Instructions (Signed)
Please ask that a sedimentation rate and C-reactive protein be added to your blood work tomorrow.

## 2017-01-20 NOTE — Progress Notes (Signed)
[facility] for Infectious Disease  Patient Active Problem List   Diagnosis Date Noted  . S/P total knee arthroplasty, right 05/31/2016    Priority: High  . Infection of prosthetic right knee joint ([place]) 05/31/2016    Priority: High  . Wound dehiscence, surgical 04/12/2016    Priority: High  . 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 Metastatic breast cancer ([place]) 11/12/2010    Priority: Medium  . Pyogenic arthritis of right knee joint ([place])   . Acute blood loss anemia 12/12/2016  . Right knee abx spacer 12/02/2016  . Physical exam 07/31/2016  . Allergic reaction 07/24/2016  . Drug rash 07/24/2016  . Nonhealing surgical wound 05/28/2016  . Sepsis ([place]) 05/10/2016  . UTI (urinary tract infection) 05/10/2016  . Cellulitis of right knee 05/10/2016  . Blood clot in vein   . 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 Vitamin D deficiency 01/25/2016  . Primary osteoarthritis of right knee 02/01/2015  . Symptomatic anemia 02/01/2015  . Frequent UTI 01/12/2014  . Sinus tachycardia 11/03/2013  . DOE (dyspnea on exertion) 11/03/2013  . Postoperative anemia due to acute blood loss 12/24/2012  .    09/23/2012  . Sinusitis 07/28/2012  . Neuropathy due to chemotherapeutic drug ([place]) 07/25/2011  . 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 Chronic anticoagulation 11/12/2010  . Anxiety, generalized 03/26/2008    Patient's Medications  New Prescriptions   No medications on file  Previous Medications   ACETAMINOPHEN (TYLENOL) 500 MG TABLET    Take 2 tablets (1,000 mg total) by mouth every 8 (eight) hours.   ADO-TRASTUZUMAB EMTANSINE (KADCYLA IV)    Inject into the vein every 21 ( twenty-one) days.   CELECOXIB (CELEBREX) 200 MG CAPSULE    Take 1 capsule (200 mg total) by mouth 2 (two) times daily.   CHOLECALCIFEROL (VITAMIN D3) 2000 UNITS TABS    Take 1 tablet by mouth 4 (four) times a week.    DARIFENACIN (ENABLEX) 15 MG 24 HR TABLET    Take 15 mg by  mouth daily.   DOCUSATE SODIUM (COLACE) 100 MG CAPSULE    Take 1 capsule (100 mg total) by mouth 2 (two) times daily as needed for mild constipation.   ENOXAPARIN (LOVENOX) 40 MG/0.4ML INJECTION    Inject 0.4 mLs (40 mg total) into the skin daily. Use or 2 weeks, then may resume regular dose.   FERROUS SULFATE 325 (65 FE) MG TABLET    Take 1 tablet (325 mg total) by mouth 3 (three) times daily after meals.   GABAPENTIN (NEURONTIN) 300 MG CAPSULE    TAKE 1 CAPSULE BY MOUTH 3 TIMES DAILY   LINEZOLID (ZYVOX) 600 MG TABLET    Take 1 tablet (600 mg total) by mouth 2 (two) times daily.   METHOCARBAMOL (ROBAXIN) 500 MG TABLET    Take 1 tablet (500 mg total) by mouth every 6 (six) hours as needed for muscle spasms.   ONDANSETRON (ZOFRAN) 8 MG TABLET    Take 1 tablet (8 mg total) by mouth every 8 (eight) hours as needed for nausea.   OXYCODONE (OXY IR/ROXICODONE) 5 MG IMMEDIATE RELEASE TABLET    Take 1-3 tablets (5-15 mg total) by mouth every 4 (four) hours as needed for severe pain.   POLYETHYLENE GLYCOL (MIRALAX / GLYCOLAX) PACKET    Take 17 g by mouth 2 (two) times daily.   ZOLPIDEM (AMBIEN) 5 MG TABLET  TAKE 1 TABLET BY MOUTH ONCE DAILY AT BEDTIME AS NEEDED FOR SLEEP  Modified Medications   No medications on file  Discontinued Medications   No medications on file    Subjective: [name] is in with her husband for her hospital follow-up visit. 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 She received 6 weeks of IV vancomycin. She has penicillin allergy but underwent testing and appeared like she could tolerate amoxicillin. However shortly after starting amoxicillin and she broke out in a rash and she was changed to oral linezolid which she took for 4-6 weeks and stopped because of concerns about potential side effects. She developed a hematoma over her right knee in June. Cultures of drainage grew Peptostreptococcus and she got  several weeks of oral metronidazole. She was readmitted to the hospital in early July and underwent resection arthroplasty and spacer placement. Operative cultures grew enterococcus again. She was treated with vancomycin and ceftriaxone for 6 weeks through 01/12/2017. She started back on oral linezolid 1 week ago. She had the wound VAC dressing replaced last week. Her wound is getting smaller and she believe she is going to undergo a skin graft next week. Her right knee pain is under good control with acetaminophen as needed. She is still receiving chemotherapy for her breast cancer every 3 weeks. Her next treatment is tomorrow at [facility].  Review of Systems: Review of Systems  Constitutional: Negative for chills, diaphoresis and fever.  HENT:       No sores or other lesions in her mouth.  Respiratory: Negative for cough.   Cardiovascular: Negative for chest pain.  Gastrointestinal: Negative for abdominal pain, diarrhea, nausea and vomiting.  Musculoskeletal: Positive for joint pain.  Skin: Negative for rash.    Past Medical History:  Diagnosis Date  . 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 Allergic reaction 07/24/2016  . Arthritis KNEES   right knee, hx. past left knee replacemnt.  . Blood clot in vein    around Portacath right chest-tx. Eliquis about a yr., prior Lovenox.  . Blood dyscrasia   . Chronic anticoagulation HX  PORT-A-CATH CLOT   2010 - HAS BEEN ON LOVENOX SINCE THE CLOT  . Drug rash 07/24/2016  . Elevated blood pressure reading without diagnosis of hypertension    PT MONITORS HER B/P AT HOME  . Heart murmur   . 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 PONV (postoperative nausea and vomiting)    ponv likes scopolamine patch  . Skin cancer of anterior chest BREAST CANCER PRIMARY W/ METS TO CHEST WALL SKIN CANCER--   CHEMOTHERAPY EVERY 3 WEEKS AT [facility]   left 9 yrs ago, chemo every 3 weeks at [place], last chemo july 3rd  . Status post skin flap graft    right chest wall with metastatis right anterior chest -no drainage or open wound.    Social History  Substance Use Topics  . Smoking status: Never Smoker  . Smokeless tobacco: Never Used  . Alcohol use No    Family History  Problem Relation Age of Onset  . Heart disease Mother        due to mitral valve regurgiation,   . Cancer Mother        breast  . Allergic rhinitis Mother   .  Parkinsonism Father   . Allergic rhinitis Father   . Migraines Father   . Alcohol abuse Paternal Grandfather   . Angioedema Neg Hx   . Asthma Neg Hx   . Eczema Neg Hx     Allergies  Allergen Reactions  . Penicillins Hives, Itching and Rash    Has patient had a PCN reaction causing immediate rash, facial/tongue/throat swelling, SOB or lightheadedness with hypotension: no Has patient had a PCN reaction causing severe rash involving mucus membranes or skin necrosis: No Has patient had a PCN reaction that required hospitalization No Has patient had a PCN reaction occurring within the last 10 years: yes If all of the above answers are "NO", then may proceed with Cephalosporin use.   Denies airway involvement   . Other Rash    STERI STRIPS - Blisters  . Tape Hives    Can tolerate paper tape    Objective: Vitals:   01/20/17 0902  BP: 106/71  Pulse: (!) 121  Temp: 98.4 F (36.9 C)  TempSrc: Oral  Weight: 154 lb (69.9 kg)   Body mass index is 24.12 kg/m.  Physical Exam  Constitutional: She is oriented to person, place, and time.  She is in good spirits. She is accompanied by her husband.  Musculoskeletal:  Her right leg is in a soft brace. She has a VAC dressing in the wound over her right knee. There is no surrounding cellulitis.  Neurological: She is alert and oriented to person, place, and time.  Skin: No rash noted.  Psychiatric: Mood and  affect normal.    Lab Results Sedimentation rate 01/05/2017: 56 C-reactive protein 01/05/2017: 24.5   Problem List Items Addressed This Visit      High   Infection of prosthetic right knee joint ([place])    I'm hopeful that her infection will be cured through a combination of her recent resection arthroplasty and postoperative antibiotics. She is now completed 7 weeks of total antibiotic therapy since surgery. I talked to her again about potential side effects of linezolid. Unfortunately she has very limited options for oral treatment given her penicillin allergy. She will get a CBC tomorrow at [facility] to see if there is any linezolid related thrombocytopenia. I will also have them recheck her sedimentation rate and C-reactive protein. I talked her about other potential side effects including peripheral neuropathy and optic neuritis. I asked her to call me after her skin graft next week. We will make a decision then whether or not to stop linezolid or to continue with it for a total of 28 days. She will follow-up with me in one month.          Michel Bickers, MD [facility] for Infectious [facility] [phone] pager   [phone] cell 01/20/2017, 10:04 AM

## 2017-01-20 NOTE — Assessment & Plan Note (Signed)
I'm hopeful that her infection will be cured through a combination of her recent resection arthroplasty and postoperative antibiotics. She is now completed 7 weeks of total antibiotic therapy since surgery. I talked to her again about potential side effects of linezolid. Unfortunately she has very limited options for oral treatment given her penicillin allergy. She will get a CBC tomorrow at [facility] to see if there is any linezolid related thrombocytopenia. I will also have them recheck her sedimentation rate and C-reactive protein. I talked her about other potential side effects including peripheral neuropathy and optic neuritis. I asked her to call me after her skin graft next week. We will make a decision then whether or not to stop linezolid or to continue with it for a total of 28 days. She will follow-up with me in one month.

## 2017-01-21 DIAGNOSIS — Z5112 Encounter for antineoplastic immunotherapy: Secondary | ICD-10-CM | POA: Diagnosis not present

## 2017-01-21 DIAGNOSIS — G629 Polyneuropathy, unspecified: Secondary | ICD-10-CM | POA: Diagnosis not present

## 2017-01-21 DIAGNOSIS — M7989 Other specified soft tissue disorders: Secondary | ICD-10-CM | POA: Diagnosis not present

## 2017-01-21 DIAGNOSIS — I5020 Unspecified systolic (congestive) heart failure: Secondary | ICD-10-CM | POA: Diagnosis not present

## 2017-01-21 DIAGNOSIS — Z171 Estrogen receptor negative status [ER-]: Secondary | ICD-10-CM | POA: Diagnosis not present

## 2017-01-21 DIAGNOSIS — C50919 Malignant neoplasm of unspecified site of unspecified female breast: Secondary | ICD-10-CM | POA: Diagnosis not present

## 2017-01-21 DIAGNOSIS — C792 Secondary malignant neoplasm of skin: Secondary | ICD-10-CM | POA: Diagnosis not present

## 2017-01-21 DIAGNOSIS — C50412 Malignant neoplasm of upper-outer quadrant of left female breast: Secondary | ICD-10-CM | POA: Diagnosis not present

## 2017-01-21 DIAGNOSIS — I340 Nonrheumatic mitral (valve) insufficiency: Secondary | ICD-10-CM | POA: Diagnosis not present

## 2017-01-21 DIAGNOSIS — R748 Abnormal levels of other serum enzymes: Secondary | ICD-10-CM | POA: Diagnosis not present

## 2017-01-22 ENCOUNTER — Encounter (HOSPITAL_BASED_OUTPATIENT_CLINIC_OR_DEPARTMENT_OTHER): Payer: Self-pay | Admitting: *Deleted

## 2017-01-23 ENCOUNTER — Other Ambulatory Visit: Payer: Self-pay | Admitting: Plastic Surgery

## 2017-01-23 ENCOUNTER — Ambulatory Visit: Payer: Self-pay | Admitting: Plastic Surgery

## 2017-01-23 DIAGNOSIS — T8189XD Other complications of procedures, not elsewhere classified, subsequent encounter: Secondary | ICD-10-CM | POA: Diagnosis not present

## 2017-01-23 DIAGNOSIS — S81001D Unspecified open wound, right knee, subsequent encounter: Secondary | ICD-10-CM

## 2017-01-23 DIAGNOSIS — S91001D Unspecified open wound, right ankle, subsequent encounter: Principal | ICD-10-CM

## 2017-01-23 DIAGNOSIS — S81801D Unspecified open wound, right lower leg, subsequent encounter: Principal | ICD-10-CM

## 2017-01-23 DIAGNOSIS — S81001A Unspecified open wound, right knee, initial encounter: Secondary | ICD-10-CM | POA: Diagnosis not present

## 2017-01-23 NOTE — Progress Notes (Signed)
Surgery on 01/29/17.  Preop on 01/28/17.  Need orders in epic.  Thank You .

## 2017-01-23 NOTE — Patient Instructions (Addendum)
[name]  01/23/2017   Your procedure is scheduled on: 01/29/17  Report to [facility] Main  Entrance Take [place]  elevators to 3rd floor to  [place] at   [place] AM.    Call this number if you have problems the morning of surgery [phone]    Remember: ONLY 1 PERSON MAY GO WITH YOU TO SHORT STAY TO GET  READY MORNING OF [place].  Do not eat food or drink liquids :After Midnight.     Take these medicines the morning of surgery with A SIP OF WATER: gabapentin, linezolid( zyvox) , darifenacin( Enablex)                                You may not have any metal on your body including hair pins and              piercings  Do not wear jewelry, make-up, lotions, powders or perfumes, deodorant             Do not wear nail polish.  Do not shave  48 hours prior to surgery.                 Do not bring valuables to the hospital. [place].  Contacts, dentures or bridgework may not be worn into surgery.     Patients discharged the day of surgery will not be allowed to drive home.  Name and phone number of your driver:  Special Instructions: N/A              Please read over the following fact sheets you were given: _____________________________________________________________________             [facility] - Preparing for Surgery Before surgery, you can play an important role.  Because skin is not sterile, your skin needs to be as free of germs as possible.  You can reduce the number of germs on your skin by washing with CHG (chlorahexidine gluconate) soap before surgery.  CHG is an antiseptic cleaner which kills germs and bonds with the skin to continue killing germs even after washing. Please DO NOT use if you have an allergy to CHG or antibacterial soaps.  If your skin becomes reddened/irritated stop using the CHG and inform your nurse when you arrive at Short Stay. Do not shave (including legs and  underarms) for at least 48 hours prior to the first CHG shower.  You may shave your face/neck. Please follow these instructions carefully:  1.  Shower with CHG Soap the night before surgery and the  morning of Surgery.  2.  If you choose to wash your hair, wash your hair first as usual with your  normal  shampoo.  3.  After you shampoo, rinse your hair and body thoroughly to remove the  shampoo.                           4.  Use CHG as you would any other liquid soap.  You can apply chg directly  to the skin and wash  Gently with a scrungie or clean washcloth.  5.  Apply the CHG Soap to your body ONLY FROM THE NECK DOWN.   Do not use on face/ open                           Wound or open sores. Avoid contact with eyes, ears mouth and genitals (private parts).                       Wash face,  Genitals (private parts) with your normal soap.             6.  Wash thoroughly, paying special attention to the area where your surgery  will be performed.  7.  Thoroughly rinse your body with warm water from the neck down.  8.  DO NOT shower/wash with your normal soap after using and rinsing off  the CHG Soap.                9.  Pat yourself dry with a clean towel.            10.  Wear clean pajamas.            11.  Place clean sheets on your bed the night of your first shower and do not  sleep with pets. Day of Surgery : Do not apply any lotions/deodorants the morning of surgery.  Please wear clean clothes to the hospital/surgery center.  FAILURE TO FOLLOW THESE INSTRUCTIONS MAY RESULT IN THE CANCELLATION OF YOUR SURGERY PATIENT SIGNATURE_________________________________  NURSE SIGNATURE__________________________________  ________________________________________________________________________

## 2017-01-28 ENCOUNTER — Encounter (HOSPITAL_COMMUNITY)
Admission: RE | Admit: 2017-01-28 | Discharge: 2017-01-28 | Disposition: A | Discharge status: Home / Self Care | Payer: 59 | Source: Ambulatory Visit | Attending: Plastic Surgery | Admitting: Plastic Surgery

## 2017-01-28 ENCOUNTER — Encounter (HOSPITAL_COMMUNITY): Payer: Self-pay

## 2017-01-28 DIAGNOSIS — T8189XA Other complications of procedures, not elsewhere classified, initial encounter: Secondary | ICD-10-CM | POA: Diagnosis present

## 2017-01-28 DIAGNOSIS — Z880 Allergy status to penicillin: Secondary | ICD-10-CM | POA: Diagnosis not present

## 2017-01-28 DIAGNOSIS — Z85828 Personal history of other malignant neoplasm of skin: Secondary | ICD-10-CM | POA: Diagnosis not present

## 2017-01-28 DIAGNOSIS — Z95828 Presence of other vascular implants and grafts: Secondary | ICD-10-CM | POA: Diagnosis not present

## 2017-01-28 DIAGNOSIS — S81801D Unspecified open wound, right lower leg, subsequent encounter: Principal | ICD-10-CM

## 2017-01-28 DIAGNOSIS — Z96653 Presence of artificial knee joint, bilateral: Secondary | ICD-10-CM | POA: Diagnosis not present

## 2017-01-28 DIAGNOSIS — T814XXA Infection following a procedure, initial encounter: Secondary | ICD-10-CM | POA: Diagnosis not present

## 2017-01-28 DIAGNOSIS — Z9221 Personal history of antineoplastic chemotherapy: Secondary | ICD-10-CM | POA: Diagnosis not present

## 2017-01-28 DIAGNOSIS — Z79899 Other long term (current) drug therapy: Secondary | ICD-10-CM | POA: Diagnosis not present

## 2017-01-28 DIAGNOSIS — L089 Local infection of the skin and subcutaneous tissue, unspecified: Secondary | ICD-10-CM | POA: Diagnosis not present

## 2017-01-28 DIAGNOSIS — Z91048 Other nonmedicinal substance allergy status: Secondary | ICD-10-CM | POA: Diagnosis not present

## 2017-01-28 DIAGNOSIS — S91001D Unspecified open wound, right ankle, subsequent encounter: Principal | ICD-10-CM

## 2017-01-28 DIAGNOSIS — Z853 Personal history of malignant neoplasm of breast: Secondary | ICD-10-CM | POA: Diagnosis not present

## 2017-01-28 DIAGNOSIS — Z9013 Acquired absence of bilateral breasts and nipples: Secondary | ICD-10-CM | POA: Diagnosis not present

## 2017-01-28 DIAGNOSIS — S81001D Unspecified open wound, right knee, subsequent encounter: Secondary | ICD-10-CM

## 2017-01-28 DIAGNOSIS — M1990 Unspecified osteoarthritis, unspecified site: Secondary | ICD-10-CM | POA: Diagnosis not present

## 2017-01-28 NOTE — Progress Notes (Signed)
Labs from [facility] 01/21/17 in chart and in care everywhere. cxr 05/10/16 epic ekg 12/13/16 epic Echo 11/19/16 epic

## 2017-01-29 ENCOUNTER — Encounter (HOSPITAL_COMMUNITY): Payer: Self-pay

## 2017-01-29 ENCOUNTER — Ambulatory Visit (HOSPITAL_COMMUNITY): Payer: 59 | Admitting: Anesthesiology

## 2017-01-29 ENCOUNTER — Encounter (HOSPITAL_COMMUNITY)
Admission: RE | Disposition: A | Discharge status: Home / Self Care | Payer: Self-pay | Source: Ambulatory Visit | Attending: Plastic Surgery

## 2017-01-29 ENCOUNTER — Ambulatory Visit (HOSPITAL_COMMUNITY)
Admission: RE | Admit: 2017-01-29 | Discharge: 2017-01-29 | Disposition: A | Discharge status: Home / Self Care | Payer: 59 | Source: Ambulatory Visit | Attending: Plastic Surgery | Admitting: Plastic Surgery

## 2017-01-29 DIAGNOSIS — Z9013 Acquired absence of bilateral breasts and nipples: Secondary | ICD-10-CM | POA: Insufficient documentation

## 2017-01-29 DIAGNOSIS — L089 Local infection of the skin and subcutaneous tissue, unspecified: Secondary | ICD-10-CM | POA: Diagnosis not present

## 2017-01-29 DIAGNOSIS — Z96653 Presence of artificial knee joint, bilateral: Secondary | ICD-10-CM | POA: Diagnosis not present

## 2017-01-29 DIAGNOSIS — Z9221 Personal history of antineoplastic chemotherapy: Secondary | ICD-10-CM | POA: Diagnosis not present

## 2017-01-29 DIAGNOSIS — Z853 Personal history of malignant neoplasm of breast: Secondary | ICD-10-CM | POA: Insufficient documentation

## 2017-01-29 DIAGNOSIS — T814XXA Infection following a procedure, initial encounter: Secondary | ICD-10-CM | POA: Diagnosis not present

## 2017-01-29 DIAGNOSIS — Z85828 Personal history of other malignant neoplasm of skin: Secondary | ICD-10-CM | POA: Insufficient documentation

## 2017-01-29 DIAGNOSIS — Z95828 Presence of other vascular implants and grafts: Secondary | ICD-10-CM | POA: Insufficient documentation

## 2017-01-29 DIAGNOSIS — S81801D Unspecified open wound, right lower leg, subsequent encounter: Secondary | ICD-10-CM

## 2017-01-29 DIAGNOSIS — Z880 Allergy status to penicillin: Secondary | ICD-10-CM | POA: Insufficient documentation

## 2017-01-29 DIAGNOSIS — M1990 Unspecified osteoarthritis, unspecified site: Secondary | ICD-10-CM | POA: Diagnosis not present

## 2017-01-29 DIAGNOSIS — D62 Acute posthemorrhagic anemia: Secondary | ICD-10-CM | POA: Diagnosis not present

## 2017-01-29 DIAGNOSIS — N390 Urinary tract infection, site not specified: Secondary | ICD-10-CM | POA: Diagnosis not present

## 2017-01-29 DIAGNOSIS — M222X1 Patellofemoral disorders, right knee: Secondary | ICD-10-CM | POA: Diagnosis not present

## 2017-01-29 DIAGNOSIS — Z79899 Other long term (current) drug therapy: Secondary | ICD-10-CM | POA: Insufficient documentation

## 2017-01-29 DIAGNOSIS — S81001A Unspecified open wound, right knee, initial encounter: Secondary | ICD-10-CM | POA: Diagnosis not present

## 2017-01-29 DIAGNOSIS — S91001D Unspecified open wound, right ankle, subsequent encounter: Secondary | ICD-10-CM

## 2017-01-29 DIAGNOSIS — S81001D Unspecified open wound, right knee, subsequent encounter: Secondary | ICD-10-CM

## 2017-01-29 DIAGNOSIS — Z91048 Other nonmedicinal substance allergy status: Secondary | ICD-10-CM | POA: Insufficient documentation

## 2017-01-29 HISTORY — PX: SKIN SPLIT GRAFT: SHX444

## 2017-01-29 SURGERY — APPLICATION, GRAFT, SKIN, SPLIT-THICKNESS
Anesthesia: General | Site: Knee | Laterality: Right

## 2017-01-29 MED ORDER — PROMETHAZINE HCL 25 MG/ML IJ SOLN: 6.2500 mg | INTRAMUSCULAR | Status: DC | PRN

## 2017-01-29 MED ORDER — CIPROFLOXACIN IN D5W 400 MG/200ML IV SOLN
400.0000 mg | INTRAVENOUS | Status: AC
  Administered 2017-01-29: 400 mg via INTRAVENOUS

## 2017-01-29 MED ORDER — PHENYLEPHRINE HCL 10 MG/ML IJ SOLN
INTRAMUSCULAR | Status: DC | PRN
  Administered 2017-01-29 (×2): 40 ug via INTRAVENOUS
  Administered 2017-01-29: 80 ug via INTRAVENOUS

## 2017-01-29 MED ORDER — LACTATED RINGERS IV SOLN
INTRAVENOUS | Status: DC
  Administered 2017-01-29: 1000 mL via INTRAVENOUS
  Administered 2017-01-29: 06:00:00 via INTRAVENOUS

## 2017-01-29 MED ORDER — MIDAZOLAM HCL 2 MG/2ML IJ SOLN
INTRAMUSCULAR | Status: AC
  Filled 2017-01-29: qty 2

## 2017-01-29 MED ORDER — LIDOCAINE-EPINEPHRINE (PF) 1 %-1:200000 IJ SOLN
INTRAMUSCULAR | Status: DC | PRN
  Administered 2017-01-29: 10 mL

## 2017-01-29 MED ORDER — MIDAZOLAM HCL 2 MG/2ML IJ SOLN
1.0000 mg | INTRAMUSCULAR | Status: DC | PRN
  Administered 2017-01-29 (×2): 1 mg via INTRAVENOUS

## 2017-01-29 MED ORDER — FENTANYL CITRATE (PF) 100 MCG/2ML IJ SOLN
INTRAMUSCULAR | Status: AC
  Filled 2017-01-29: qty 2

## 2017-01-29 MED ORDER — ONDANSETRON HCL 4 MG/2ML IJ SOLN
INTRAMUSCULAR | Status: DC | PRN
  Administered 2017-01-29: 4 mg via INTRAVENOUS

## 2017-01-29 MED ORDER — PROPOFOL 10 MG/ML IV BOLUS
INTRAVENOUS | Status: DC | PRN
  Administered 2017-01-29: 140 mg via INTRAVENOUS

## 2017-01-29 MED ORDER — ONDANSETRON HCL 4 MG/2ML IJ SOLN
INTRAMUSCULAR | Status: AC
  Filled 2017-01-29: qty 2

## 2017-01-29 MED ORDER — MEPERIDINE HCL 50 MG/ML IJ SOLN: 6.2500 mg | INTRAMUSCULAR | Status: DC | PRN

## 2017-01-29 MED ORDER — PROPOFOL 10 MG/ML IV BOLUS
INTRAVENOUS | Status: AC
  Filled 2017-01-29: qty 40

## 2017-01-29 MED ORDER — ONDANSETRON HCL 4 MG/2ML IJ SOLN
INTRAMUSCULAR | Status: AC
Start: 2017-01-29 — End: 2017-01-29
  Filled 2017-01-29: qty 4

## 2017-01-29 MED ORDER — LIDOCAINE HCL (CARDIAC) 20 MG/ML IV SOLN
INTRAVENOUS | Status: DC | PRN
  Administered 2017-01-29: 60 mg via INTRAVENOUS

## 2017-01-29 MED ORDER — LACTATED RINGERS IV SOLN: INTRAVENOUS | Status: DC

## 2017-01-29 MED ORDER — DEXAMETHASONE SODIUM PHOSPHATE 10 MG/ML IJ SOLN
INTRAMUSCULAR | Status: AC
  Filled 2017-01-29: qty 2

## 2017-01-29 MED ORDER — DEXAMETHASONE SODIUM PHOSPHATE 10 MG/ML IJ SOLN
INTRAMUSCULAR | Status: DC | PRN
  Administered 2017-01-29: 10 mg via INTRAVENOUS

## 2017-01-29 MED ORDER — DEXAMETHASONE SODIUM PHOSPHATE 10 MG/ML IJ SOLN
INTRAMUSCULAR | Status: AC
  Filled 2017-01-29: qty 1

## 2017-01-29 MED ORDER — FENTANYL CITRATE (PF) 100 MCG/2ML IJ SOLN
50.0000 ug | INTRAMUSCULAR | Status: DC | PRN
  Administered 2017-01-29: 25 ug via INTRAVENOUS
  Administered 2017-01-29: 50 ug via INTRAVENOUS

## 2017-01-29 MED ORDER — CIPROFLOXACIN IN D5W 400 MG/200ML IV SOLN
INTRAVENOUS | Status: AC
  Filled 2017-01-29: qty 200

## 2017-01-29 MED ORDER — SCOPOLAMINE 1 MG/3DAYS TD PT72
1.0000 | MEDICATED_PATCH | Freq: Once | TRANSDERMAL | Status: DC | PRN
  Administered 2017-01-29: 1.5 mg via TRANSDERMAL
  Filled 2017-01-29: qty 1

## 2017-01-29 MED ORDER — BUPIVACAINE HCL (PF) 0.5 % IJ SOLN
INTRAMUSCULAR | Status: AC
  Filled 2017-01-29: qty 30

## 2017-01-29 MED ORDER — FENTANYL CITRATE (PF) 100 MCG/2ML IJ SOLN: 25.0000 ug | INTRAMUSCULAR | Status: DC | PRN

## 2017-01-29 MED ORDER — SODIUM CHLORIDE 0.9 % IV SOLN
INTRAVENOUS | Status: AC
  Filled 2017-01-29: qty 500000

## 2017-01-29 MED ORDER — SODIUM CHLORIDE 0.9 % IV SOLN
INTRAVENOUS | Status: DC | PRN
  Administered 2017-01-29: 500 mL

## 2017-01-29 MED ORDER — LIDOCAINE-EPINEPHRINE (PF) 1 %-1:200000 IJ SOLN
INTRAMUSCULAR | Status: AC
Start: 2017-01-29 — End: 2017-01-29
  Filled 2017-01-29: qty 30

## 2017-01-29 MED ORDER — EPINEPHRINE PF 1 MG/ML IJ SOLN
INTRAMUSCULAR | Status: AC
  Filled 2017-01-29: qty 1

## 2017-01-29 MED ORDER — MINERAL OIL LIGHT 100 % EX OIL
TOPICAL_OIL | CUTANEOUS | Status: AC
  Filled 2017-01-29: qty 25

## 2017-01-29 MED ORDER — LIDOCAINE 2% (20 MG/ML) 5 ML SYRINGE
INTRAMUSCULAR | Status: AC
  Filled 2017-01-29: qty 10

## 2017-01-29 SURGICAL SUPPLY — 36 items
APPLIER CLIP 11 MED OPEN (CLIP)
APR CLP MED 11 20 MLT OPN (CLIP)
BAG SPEC THK2 15X12 ZIP CLS (MISCELLANEOUS) ×1
BAG ZIPLOCK 12X15 (MISCELLANEOUS) ×2 IMPLANT
BANDAGE ACE 4X5 VEL STRL LF (GAUZE/BANDAGES/DRESSINGS) ×1 IMPLANT
BANDAGE ACE 6X5 VEL STRL LF (GAUZE/BANDAGES/DRESSINGS) ×1 IMPLANT
BLADE CLIPPER SURG (BLADE) ×2 IMPLANT
BLADE DERMATOME SS (BLADE) ×2 IMPLANT
BNDG GAUZE ELAST 4 BULKY (GAUZE/BANDAGES/DRESSINGS) IMPLANT
CLIP APPLIE 11 MED OPEN (CLIP) IMPLANT
COVER SURGICAL LIGHT HANDLE (MISCELLANEOUS) ×2 IMPLANT
DEPRESSOR TONGUE BLADE STERILE (MISCELLANEOUS) ×4 IMPLANT
DERMACARRIERS GRAFT 1 TO 1.5 (DISPOSABLE) ×2
DRAPE LAPAROTOMY T 102X78X121 (DRAPES) ×2 IMPLANT
DRSG ADAPTIC 3X8 NADH LF (GAUZE/BANDAGES/DRESSINGS) ×1 IMPLANT
DRSG KUZMA FLUFF (GAUZE/BANDAGES/DRESSINGS) ×1 IMPLANT
DRSG PAD ABDOMINAL 8X10 ST (GAUZE/BANDAGES/DRESSINGS) IMPLANT
ELECT REM PT RETURN 15FT ADLT (MISCELLANEOUS) IMPLANT
GAUZE SPONGE 4X4 12PLY STRL (GAUZE/BANDAGES/DRESSINGS) ×4 IMPLANT
GAUZE XEROFORM 5X9 LF (GAUZE/BANDAGES/DRESSINGS) ×1 IMPLANT
GRAFT DERMACARRIERS 1 TO 1.5 (DISPOSABLE) ×1 IMPLANT
HANDPIECE INTERPULSE COAX TIP (DISPOSABLE)
KIT BASIN OR (CUSTOM PROCEDURE TRAY) ×2 IMPLANT
MANIFOLD NEPTUNE II (INSTRUMENTS) ×2 IMPLANT
NS IRRIG 1000ML POUR BTL (IV SOLUTION) ×2 IMPLANT
PACK ORTHO EXTREMITY (CUSTOM PROCEDURE TRAY) ×2 IMPLANT
PAD ABD 8X10 STRL (GAUZE/BANDAGES/DRESSINGS) ×1 IMPLANT
PADDING CAST COTTON 6X4 STRL (CAST SUPPLIES) IMPLANT
SET HNDPC FAN SPRY TIP SCT (DISPOSABLE) IMPLANT
STAPLER VISISTAT 35W (STAPLE) ×1 IMPLANT
SUT PDS AB 5-0 PS2 (SUTURE) ×1 IMPLANT
SUT VIC AB 2-0 SH 18 (SUTURE) IMPLANT
SUT VIC AB 5-0 PS2 18 (SUTURE) ×2 IMPLANT
TAPE CLOTH SURG 4X10 WHT LF (GAUZE/BANDAGES/DRESSINGS) ×1 IMPLANT
UNDERPAD 30X30 (UNDERPADS AND DIAPERS) ×2 IMPLANT
WATER STERILE IRR 1500ML POUR (IV SOLUTION) ×1 IMPLANT

## 2017-01-29 NOTE — H&P (Signed)
[name] is an 61 y.o. female.   Chief Complaint: right knee wound HPI: The patient is a 61 yrs old wf here for treatment of her right knee wound.  She has undergone several ortho procedures on her knee with subsequent infection and hardware exposure.  There is a wound on the knee that has improved with time but needs to be closed completely to have the next ortho procedure.  She is otherwise in stable condition.  Past Medical History:  Diagnosis Date  . 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 Allergic reaction 07/24/2016  . Anemia    needed transfusion spring of 2018  . Arthritis KNEES   right knee, hx. past left knee replacemnt.  . Blood clot in vein    around Portacath right chest-tx. Eliquis about a yr., prior Lovenox.  . Blood dyscrasia   . Chronic anticoagulation HX  PORT-A-CATH CLOT   2010 - HAS BEEN ON LOVENOX SINCE THE CLOT  . Drug rash 07/24/2016  . Elevated blood pressure reading without diagnosis of hypertension    PT MONITORS HER B/P AT HOME  . Heart murmur   . 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 PONV (postoperative nausea and vomiting)    ponv likes scopolamine patch  . Skin cancer of anterior chest BREAST CANCER PRIMARY W/ METS TO CHEST WALL SKIN CANCER--  CHEMOTHERAPY EVERY 3 WEEKS AT [facility]   left 9 yrs ago, chemo every 3 weeks at [place], last chemo july 3rd  . Status post skin flap graft    right chest wall with metastatis right anterior chest -no drainage or open wound.    Past Surgical History:  Procedure Laterality Date  . APPLICATION OF A-CELL OF BACK Right 07/31/2016   Procedure: CELLERATE COLLAGEN PLACEMENT;  Surgeon: Loel Lofty Jef Futch, DO;  Location: [place];  Service: Plastics;  Laterality: Right;  . CHEST WALL TUMOR EXCISION     right  . EXCISIONAL TOTAL KNEE ARTHROPLASTY WITH ANTIBIOTIC SPACERS Right 12/02/2016   Procedure: EXCISIONAL TOTAL KNEE ARTHROPLASTY WITH ANTIBIOTIC SPACERS;  Surgeon: Paralee Cancel, MD;  Location: [facility];  Service: Orthopedics;  Laterality: Right;  90 mins  . hemotoma     evacuation left chest wall  . I&D KNEE WITH POLY EXCHANGE Right 05/28/2016   Procedure: IRRIGATION AND DEBRIDEMENT KNEE WOUND VAC PLACMENT;  Surgeon: Susa Day, MD;  Location: [facility];  Service: Orthopedics;  Laterality: Right;  . I&D KNEE WITH POLY EXCHANGE Right 05/30/2016   Procedure: RADICAL SYNOVECTOMY,IRRIGATION AND DEBRIDEMENT KNEE WITH POLY EXCHANGE WITH ANTIBIOTIC BEADS, APPLICATION OF WOUND VAC;  Surgeon: Susa Day, MD;  Location: [facility];  Service: Orthopedics;  Laterality: Right;  . INCISION AND DRAINAGE OF WOUND Right 07/31/2016   Procedure: IRRIGATION AND DEBRIDEMENT RIGHT KNEE  WOUND;  Surgeon: Loel Lofty Torrell Krutz, DO;  Location: [place];  Service: Plastics;  Laterality: Right;  . IRRIGATION AND DEBRIDEMENT KNEE Right 04/12/2016   Procedure: IRRIGATION AND DEBRIDEMENT KNEE;  Surgeon: Nicholes Stairs, MD;  Location: [facility];  Service: Orthopedics;  Laterality: Right;  . IRRIGATION AND DEBRIDEMENT KNEE Right 12/16/2016   Procedure: Repeat irrigation and debridement right knee, wound closure  wound vac REPLACEMENT ANTIBIOTIC SPACERS;  Surgeon: Paralee Cancel, MD;  Location: [facility];  Service: Orthopedics;  Laterality: Right;  . KNEE ARTHROSCOPY  02/13/2012   Procedure:  ARTHROSCOPY KNEE;  Surgeon: Johnn Hai, MD;  Location: [facility];  Service: Orthopedics;  Laterality: Left;  WITH DEBRIDEMENt   . KNEE ARTHROSCOPY WITH LATERAL MENISECTOMY  02/13/2012   Procedure: KNEE ARTHROSCOPY WITH LATERAL MENISECTOMY;  Surgeon: Johnn Hai, MD;  Location: [place] [facility];  Service: Orthopedics;;  partial  . LEFT MODIFIED RADICAL MASTECTOMY/ RIGHT TOTAL MASTECTOMY  11-13-2007   LEFT BREAST CANCER W/ AXILLARY LYMPH NODE METASTASIS AND POST NEOADJUVANT CHEMO  . MASTECTOMY      Bilateral  . PLACEMENT PORT-A-CATH  06/22/2007   right chest new port placed 2015, right chest  . SKIN GRAFT     chest-post tumor removal, second occurrence of cancer" 2015 surgery for Flap 2015"  . TOTAL KNEE ARTHROPLASTY Left 09/23/2012   Procedure: LEFT TOTAL KNEE ARTHROPLASTY;  Surgeon: Johnn Hai, MD;  Location[facility];  Service: Orthopedics;  Laterality: Left;  . TOTAL KNEE ARTHROPLASTY Right 03/20/2016   Procedure: RIGHT TOTAL KNEE ARTHROPLASTY;  Surgeon: Susa Day, MD;  Location[facility];  Service: Orthopedics;  Laterality: Right;  . TRANSTHORACIC ECHOCARDIOGRAM  11-18-2011  DR BENSIMHON (ECHO EVERY 3 MONTHS)   HX CHEMO INDUCED CARDIOTOXICITY/  LVSF NORMAL/ EF 55-50%/ MILD MITRIAL VALVE REGURG./ MILDLY INCREASED SYSTOLIC PRESSURE OF PULMONARY ARTERIES  . VASCULAR SURGERY Right    portacath chest    Family History  Problem Relation Age of Onset  . Heart disease Mother        due to mitral valve regurgiation,   . Cancer Mother        breast  . Allergic rhinitis Mother   . Parkinsonism Father   . Allergic rhinitis Father   . Migraines Father   . Alcohol abuse Paternal Grandfather   . Angioedema Neg Hx   . Asthma Neg Hx   . Eczema Neg Hx    Social History:  reports that she has never smoked. She has never used smokeless tobacco. She reports that she does not drink alcohol or use drugs.  Allergies:  Allergies  Allergen Reactions  . Penicillins Hives, Itching and Rash    Has patient had a PCN reaction causing immediate rash, facial/tongue/throat swelling, SOB or lightheadedness with hypotension: no Has patient had a PCN reaction causing severe rash involving mucus membranes or skin necrosis: No Has patient had a PCN reaction that required hospitalization No Has patient had a PCN reaction occurring within the last 10 years: yes If all of the above answers are "NO", then may proceed with Cephalosporin use.   Denies airway involvement   . Other Rash    STERI STRIPS  - Blisters  . Tape Hives    Can tolerate paper tape    Medications Prior to Admission  Medication Sig Dispense Refill  . acetaminophen (TYLENOL) 500 MG tablet Take 2 tablets (1,000 mg total) by mouth every 8 (eight) hours. (Patient taking differently: Take 1,000 mg by mouth every 8 (eight) hours as needed for mild pain or headache. ) 30 tablet 0  . Ado-Trastuzumab Emtansine (KADCYLA IV) Inject into the vein every 21 ( twenty-one) days. At [facility] next treatment due 02-10-17    . Cholecalciferol (VITAMIN D3) 2000 UNITS TABS Take 1 tablet by mouth 4 (four) times a week.     . darifenacin (ENABLEX) 15 MG 24 hr tablet Take 15 mg by mouth daily.    [name] docusate sodium (COLACE) 100 MG capsule Take 1 capsule (100 mg total) by mouth 2 (two) times daily as  needed for mild constipation. 30 capsule 1  . ferrous sulfate 325 (65 FE) MG tablet Take 1 tablet (325 mg total) by mouth 3 (three) times daily after meals.  3  . gabapentin (NEURONTIN) 300 MG capsule TAKE 1 CAPSULE BY MOUTH 3 TIMES DAILY 90 capsule 6  . linezolid (ZYVOX) 600 MG tablet Take 1 tablet (600 mg total) by mouth 2 (two) times daily. 60 tablet 0  . Multiple Vitamin (MULTIVITAMIN WITH MINERALS) TABS tablet Take 1 tablet by mouth daily.    . ondansetron (ZOFRAN) 8 MG tablet Take 1 tablet (8 mg total) by mouth every 8 (eight) hours as needed for nausea. (Patient taking differently: Take 8 mg by mouth every 8 (eight) hours as needed for nausea or vomiting. Uses after chemo treatments) 20 tablet 6  . oxyCODONE (OXY IR/ROXICODONE) 5 MG immediate release tablet Take 1-3 tablets (5-15 mg total) by mouth every 4 (four) hours as needed for severe pain. (Patient taking differently: Take 5-15 mg by mouth every 6 (six) hours as needed for severe pain. ) 90 tablet 0  . polyethylene glycol (MIRALAX / GLYCOLAX) packet Take 17 g by mouth 2 (two) times daily. (Patient taking differently: Take 17 g by mouth daily as needed for mild constipation. ) 14 each 0  . vitamin  C (ASCORBIC ACID) 500 MG tablet Take 500 mg by mouth daily.    [name] zolpidem (AMBIEN) 5 MG tablet TAKE 1 TABLET BY MOUTH ONCE DAILY AT BEDTIME AS NEEDED FOR SLEEP 30 tablet 5  . celecoxib (CELEBREX) 200 MG capsule Take 1 capsule (200 mg total) by mouth 2 (two) times daily. (Patient not taking: Reported on 01/27/2017) 60 capsule 6  . enoxaparin (LOVENOX) 40 MG/0.4ML injection Inject 0.4 mLs (40 mg total) into the skin daily. Use or 2 weeks, then may resume regular dose. (Patient not taking: Reported on 01/27/2017) 14 Syringe 0  . enoxaparin (LOVENOX) 80 MG/0.8ML injection Inject 70 mg into the skin daily.    . methocarbamol (ROBAXIN) 500 MG tablet Take 1 tablet (500 mg total) by mouth every 6 (six) hours as needed for muscle spasms. (Patient not taking: Reported on 01/27/2017) 40 tablet 0    No results found for this or any previous visit (from the past 48 hour(s)). No results found.  Review of Systems  Constitutional: Negative.   HENT: Negative.   Eyes: Negative.   Respiratory: Negative.   Cardiovascular: Negative.   Gastrointestinal: Negative.   Genitourinary: Negative.   Musculoskeletal: Negative.   Skin: Negative.   Neurological: Negative.   Psychiatric/Behavioral: Negative.     Blood pressure 115/69, pulse (!) 101, temperature 99 F (37.2 C), temperature source Oral, resp. rate 18, height 5' 7"  (1.702 m), weight 69.9 kg (154 lb), last menstrual period 06/27/2007, SpO2 98 %. Physical Exam  Constitutional: She is oriented to person, place, and time. She appears well-developed and well-nourished.  HENT:  Head: Normocephalic and atraumatic.  Eyes: Pupils are equal, round, and reactive to light. EOM are normal.  Cardiovascular: Normal rate.   Respiratory: Effort normal.  GI: Soft. She exhibits no distension.  Musculoskeletal:       Legs: Neurological: She is alert and oriented to person, place, and time.  Skin: Skin is warm. No erythema.  Psychiatric: She has a normal mood and affect.  Her behavior is normal. Judgment and thought content normal.     Assessment/Plan Plan for split thickness skin graft to the right knee for wound closure.  Wallace Going, DO  01/29/2017, 7:03 AM

## 2017-01-29 NOTE — Anesthesia Postprocedure Evaluation (Signed)
Anesthesia Post Note  Patient: [name]  Procedure(s) Performed: Procedure(s) (LRB): SKIN GRAFT SPLIT THICKNESS TO RIGHT KNEE WOUND (Right)     Patient location during evaluation: PACU Anesthesia Type: General Level of consciousness: awake and alert Pain management: pain level controlled Vital Signs Assessment: post-procedure vital signs reviewed and stable Respiratory status: spontaneous breathing, nonlabored ventilation, respiratory function stable and patient connected to nasal cannula oxygen Cardiovascular status: blood pressure returned to baseline and stable Postop Assessment: no signs of nausea or vomiting Anesthetic complications: no    Last Vitals:  Vitals:   01/29/17 0911 01/29/17 0920  BP: 126/64 (!) 111/56  Pulse: 90 99  Resp: 14 18  Temp: 36.7 C 36.6 C  SpO2: 97% 96%    Last Pain:  Vitals:   01/29/17 0920  TempSrc:   PainSc: 0-No pain                 Effie Berkshire

## 2017-01-29 NOTE — Transfer of Care (Signed)
Immediate Anesthesia Transfer of Care Note  Patient: [name]  Procedure(s) Performed: Procedure(s): SKIN GRAFT SPLIT THICKNESS TO RIGHT KNEE WOUND (Right)  Patient Location: PACU  Anesthesia Type:General  Level of Consciousness:  sedated, patient cooperative and responds to stimulation  Airway & Oxygen Therapy:Patient Spontanous Breathing and Patient connected to face mask oxgen  Post-op Assessment:  Report given to PACU RN and Post -op Vital signs reviewed and stable  Post vital signs:  Reviewed and stable  Last Vitals:  Vitals:   01/29/17 0532 01/29/17 0817  BP: 115/69 122/67  Pulse: (!) 101 91  Resp: 18 16  Temp: 37.2 C   SpO2: 82% 423%    Complications: No apparent anesthesia complications

## 2017-01-29 NOTE — Op Note (Signed)
DATE OF OPERATION: 01/29/2017  LOCATION: [facility] main operating Room Outpatient  PREOPERATIVE DIAGNOSIS: right knee wound  POSTOPERATIVE DIAGNOSIS: Same  PROCEDURE: placement of 4 x 4 cm split thickness skin graft to right knee wound with VAC placement  SURGEON: [place] Maiorino Sanger Charene Mccallister, DO  ASSISTANT: Shawn Rayburn, PA  EBL: 10 cc  CONDITION: Stable  COMPLICATIONS: None  INDICATION: The patient, [name], is a 61 y.o. female born on 06-02-1955, is here for treatment of a right knee wound with ortho complications and infection.   PROCEDURE DETAILS:  The patient was seen prior to surgery and marked.  The IV antibiotics were given. The patient was taken to the operating room and given a general anesthetic. A standard time out was performed and all information was confirmed by those in the room. SCD was placed on the left leg.   The right knee was prepped and draped in the usual sterile fashion.  The knee was irrigated with saline and antibiotic solution.  The right lateral hip was prepped as well for the donor site.  The hip area was injected with local for intraoperative hemostasis and postoperative pain control.  The dermatome was set at 04/999 inch and the donor skin obtained.  The 4 x 4 cm graft was then placed on the knee and sutured into place with 5-0 Vicryl.  The adaptic was placed over the graft and the VAC applied with an excellent seal.  The hip was dressed with a xeroform and sterile gauze.  The leg was wrapped with kerlex and an ACE wrap.  The patient was allowed to wake up and taken to recovery room in stable condition at the end of the case. The family was notified at the end of the case.

## 2017-01-29 NOTE — Anesthesia Procedure Notes (Signed)
Procedure Name: LMA Insertion Date/Time: 01/29/2017 7:48 AM Performed by: Jhalen Eley, Virgel Gess Pre-anesthesia Checklist: Patient identified, Emergency Drugs available, Suction available and Patient being monitored Patient Re-evaluated:Patient Re-evaluated prior to induction Oxygen Delivery Method: Circle System Utilized Preoxygenation: Pre-oxygenation with 100% oxygen Induction Type: IV induction Ventilation: Mask ventilation without difficulty LMA: LMA inserted LMA Size: 4.0 Number of attempts: 1 Airway Equipment and Method: Bite block Placement Confirmation: positive ETCO2 Tube secured with: Tape Dental Injury: Teeth and Oropharynx as per pre-operative assessment

## 2017-01-29 NOTE — Anesthesia Preprocedure Evaluation (Addendum)
Anesthesia Evaluation  Patient identified by MRN, date of birth, ID band Patient awake    Reviewed: Allergy & Precautions, NPO status , Patient's Chart, lab work & pertinent test results  History of Anesthesia Complications (+) PONV  Airway Mallampati: I  TM Distance: >3 FB Neck ROM: Full    Dental  (+) Teeth Intact, Dental Advisory Given   Pulmonary neg pulmonary ROS,    breath sounds clear to auscultation       Cardiovascular negative cardio ROS  + Valvular Problems/Murmurs  Rhythm:Regular Rate:Normal     Neuro/Psych PSYCHIATRIC DISORDERS  Neuromuscular disease negative neurological ROS     GI/Hepatic negative GI ROS, Neg liver ROS,   Endo/Other  negative endocrine ROS  Renal/GU negative Renal ROS     Musculoskeletal  (+) Arthritis ,   Abdominal   Peds  Hematology  (+) Blood dyscrasia, ,   Anesthesia Other Findings Day of surgery medications reviewed with the patient.  Reproductive/Obstetrics                            Lab Results  Component Value Date   WBC 4.4 12/29/2016   HGB 10.5 (L) 12/29/2016   HCT 33.1 (L) 12/29/2016   MCV 100.0 12/29/2016   PLT 220 12/29/2016   Lab Results  Component Value Date   CREATININE 0.73 12/29/2016   BUN 15 12/29/2016   NA 134 (L) 12/29/2016   K 3.8 12/29/2016   CL 100 (L) 12/29/2016   CO2 28 12/29/2016   Lab Results  Component Value Date   INR 1.08 12/12/2016   INR 1.05 12/02/2016   INR 1.14 07/31/2016   EKG: sinus tachycardia.  Echo: - Left ventricle: The cavity size was normal. Systolic function was   normal. The estimated ejection fraction was in the range of 60%   to 65%. Wall motion was normal; there were no regional wall   motion abnormalities. Left ventricular diastolic function   parameters were normal. - Atrial septum: A patent foramen ovale cannot be excluded. - Impressions: No strain imaging done   Anesthesia  Physical Anesthesia Plan  ASA: III  Anesthesia Plan: General   Post-op Pain Management:    Induction: Intravenous  PONV Risk Score and Plan: 4 or greater and Ondansetron, Dexamethasone, Midazolam, Scopolamine patch - Pre-op and Treatment may vary due to age or medical condition  Airway Management Planned: LMA  Additional Equipment:   Intra-op Plan:   Post-operative Plan: Extubation in OR  Informed Consent: I have reviewed the patients History and Physical, chart, labs and discussed the procedure including the risks, benefits and alternatives for the proposed anesthesia with the patient or authorized representative who has indicated his/her understanding and acceptance.   Dental advisory given  Plan Discussed with: CRNA  Anesthesia Plan Comments:         Anesthesia Quick Evaluation

## 2017-01-29 NOTE — Discharge Instructions (Signed)
VAC at 125 mmHg continuous pressure.  Do not remove.  Doctor will remove in office in one week. Keep knee straight and limit bending for 2 weeks.

## 2017-02-02 ENCOUNTER — Other Ambulatory Visit: Payer: Self-pay | Admitting: Family Medicine

## 2017-02-02 MED FILL — DARIFENACIN ER 15 MG TABLET: 15 | 30 days supply | Qty: 30 | Fill #2

## 2017-02-02 MED FILL — ENOXAPARIN 80 MG/0.8 ML SYR: 80 | 30 days supply | Qty: 24 | Fill #1

## 2017-02-02 MED FILL — GABAPENTIN 300 MG CAPSULE: 300 | 30 days supply | Qty: 90 | Fill #0

## 2017-02-03 DIAGNOSIS — C50919 Malignant neoplasm of unspecified site of unspecified female breast: Secondary | ICD-10-CM | POA: Diagnosis not present

## 2017-02-03 DIAGNOSIS — R748 Abnormal levels of other serum enzymes: Secondary | ICD-10-CM | POA: Diagnosis not present

## 2017-02-03 DIAGNOSIS — R1900 Intra-abdominal and pelvic swelling, mass and lump, unspecified site: Secondary | ICD-10-CM | POA: Diagnosis not present

## 2017-02-03 DIAGNOSIS — C792 Secondary malignant neoplasm of skin: Secondary | ICD-10-CM | POA: Diagnosis not present

## 2017-02-03 DIAGNOSIS — C50412 Malignant neoplasm of upper-outer quadrant of left female breast: Secondary | ICD-10-CM | POA: Diagnosis not present

## 2017-02-10 DIAGNOSIS — C50412 Malignant neoplasm of upper-outer quadrant of left female breast: Secondary | ICD-10-CM | POA: Diagnosis not present

## 2017-02-10 DIAGNOSIS — C792 Secondary malignant neoplasm of skin: Secondary | ICD-10-CM | POA: Diagnosis not present

## 2017-02-10 DIAGNOSIS — I5020 Unspecified systolic (congestive) heart failure: Secondary | ICD-10-CM | POA: Diagnosis not present

## 2017-02-10 DIAGNOSIS — C50912 Malignant neoplasm of unspecified site of left female breast: Secondary | ICD-10-CM | POA: Diagnosis not present

## 2017-02-10 DIAGNOSIS — Z96651 Presence of right artificial knee joint: Secondary | ICD-10-CM | POA: Diagnosis not present

## 2017-02-10 DIAGNOSIS — R748 Abnormal levels of other serum enzymes: Secondary | ICD-10-CM | POA: Diagnosis not present

## 2017-02-10 DIAGNOSIS — Z5111 Encounter for antineoplastic chemotherapy: Secondary | ICD-10-CM | POA: Diagnosis not present

## 2017-02-10 DIAGNOSIS — Z9013 Acquired absence of bilateral breasts and nipples: Secondary | ICD-10-CM | POA: Diagnosis not present

## 2017-02-10 DIAGNOSIS — R945 Abnormal results of liver function studies: Secondary | ICD-10-CM | POA: Diagnosis not present

## 2017-02-10 DIAGNOSIS — C50919 Malignant neoplasm of unspecified site of unspecified female breast: Secondary | ICD-10-CM | POA: Diagnosis not present

## 2017-02-10 DIAGNOSIS — Z171 Estrogen receptor negative status [ER-]: Secondary | ICD-10-CM | POA: Diagnosis not present

## 2017-02-10 MED FILL — LIDOCAINE-PRILOCAINE CREAM: 2.5-2.5 | 10 days supply | Qty: 30 | Fill #0

## 2017-02-13 MED FILL — ZOLPIDEM TARTRATE 5 MG TAB: 5 | 30 days supply | Qty: 30 | Fill #3

## 2017-02-19 ENCOUNTER — Ambulatory Visit: Payer: Self-pay | Admitting: Internal Medicine

## 2017-02-23 ENCOUNTER — Ambulatory Visit (INDEPENDENT_AMBULATORY_CARE_PROVIDER_SITE_OTHER): Payer: 59 | Admitting: Internal Medicine

## 2017-02-23 ENCOUNTER — Encounter: Payer: Self-pay | Admitting: Internal Medicine

## 2017-02-23 DIAGNOSIS — T8453XD Infection and inflammatory reaction due to internal right knee prosthesis, subsequent encounter: Secondary | ICD-10-CM

## 2017-02-23 NOTE — Progress Notes (Signed)
[facility] for Infectious Disease  Patient Active Problem List   Diagnosis Date Noted  . S/P total knee arthroplasty, right 05/31/2016    Priority: High  . Infection of prosthetic right knee joint ([place]) 05/31/2016    Priority: High  . Wound dehiscence, surgical 04/12/2016    Priority: High  . 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 Metastatic breast cancer ([place]) 11/12/2010    Priority: Medium  . Pyogenic arthritis of right knee joint ([place])   . Acute blood loss anemia 12/12/2016  . Right knee abx spacer 12/02/2016  . Physical exam 07/31/2016  . Allergic reaction 07/24/2016  . Drug rash 07/24/2016  . Nonhealing surgical wound 05/28/2016  . Sepsis ([place]) 05/10/2016  . UTI (urinary tract infection) 05/10/2016  . Cellulitis of right knee 05/10/2016  . Blood clot in vein   . 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 Vitamin D deficiency 01/25/2016  . Primary osteoarthritis of right knee 02/01/2015  . Symptomatic anemia 02/01/2015  . Frequent UTI 01/12/2014  . Sinus tachycardia 11/03/2013  . DOE (dyspnea on exertion) 11/03/2013  . Postoperative anemia due to acute blood loss 12/24/2012  .    09/23/2012  . Sinusitis 07/28/2012  . Neuropathy due to chemotherapeutic drug ([place]) 07/25/2011  . 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 Chronic anticoagulation 11/12/2010  . Anxiety, generalized 03/26/2008    Patient's Medications  New Prescriptions   No medications on file  Previous Medications   ACETAMINOPHEN (TYLENOL) 500 MG TABLET    Take 2 tablets (1,000 mg total) by mouth every 8 (eight) hours.   ADO-TRASTUZUMAB EMTANSINE (KADCYLA IV)    Inject into the vein every 21 ( twenty-one) days. At [facility] next treatment due 02-10-17   CELECOXIB (CELEBREX) 200 MG CAPSULE    Take 1 capsule (200 mg total) by mouth 2 (two) times daily.   CHOLECALCIFEROL (VITAMIN D3) 2000 UNITS TABS    Take 1 tablet by mouth 4 (four) times a week.    DARIFENACIN (ENABLEX) 15 MG  24 HR TABLET    Take 15 mg by mouth daily.   DOCUSATE SODIUM (COLACE) 100 MG CAPSULE    Take 1 capsule (100 mg total) by mouth 2 (two) times daily as needed for mild constipation.   ENOXAPARIN (LOVENOX) 80 MG/0.8ML INJECTION    Inject 70 mg into the skin daily.   FERROUS SULFATE 325 (65 FE) MG TABLET    Take 1 tablet (325 mg total) by mouth 3 (three) times daily after meals.   GABAPENTIN (NEURONTIN) 300 MG CAPSULE    TAKE 1 CAPSULE BY MOUTH 3 TIMES DAILY   METHOCARBAMOL (ROBAXIN) 500 MG TABLET    Take 1 tablet (500 mg total) by mouth every 6 (six) hours as needed for muscle spasms.   MULTIPLE VITAMIN (MULTIVITAMIN WITH MINERALS) TABS TABLET    Take 1 tablet by mouth daily.   ONDANSETRON (ZOFRAN) 8 MG TABLET    Take 1 tablet (8 mg total) by mouth every 8 (eight) hours as needed for nausea.   OXYCODONE (OXY IR/ROXICODONE) 5 MG IMMEDIATE RELEASE TABLET    Take 1-3 tablets (5-15 mg total) by mouth every 4 (four) hours as needed for severe pain.   POLYETHYLENE GLYCOL (MIRALAX / GLYCOLAX) PACKET    Take 17 g by mouth 2 (two) times daily.   VITAMIN C (ASCORBIC ACID) 500 MG TABLET    Take 500 mg by mouth daily.  ZOLPIDEM (AMBIEN) 5 MG TABLET    TAKE 1 TABLET BY MOUTH ONCE DAILY AT BEDTIME AS NEEDED FOR SLEEP  Modified Medications   No medications on file  Discontinued Medications   LINEZOLID (ZYVOX) 600 MG TABLET    Take 1 tablet (600 mg total) by mouth 2 (two) times daily.    Subjective: Ms. Escalante is in with her husband for her hospital follow-up visit. 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 She received 6 weeks of IV vancomycin. She has penicillin allergy but underwent testing and appeared like she could tolerate amoxicillin. However shortly after starting amoxicillin and she broke out in a rash and she was changed to oral linezolid which she took for 4-6 weeks and stopped because of concerns about potential side effects. She  developed a hematoma over her right knee in June. Cultures of drainage grew Peptostreptococcus and she got several weeks of oral metronidazole. She was readmitted to the hospital in early July and underwent resection arthroplasty and spacer placement. Operative cultures grew enterococcus again. She was treated with vancomycin and ceftriaxone for 6 weeks through 01/12/2017. She then started on oral linezolid and took it for 3 weeks. She underwent skin grafting over her open right knee wound on 01/29/2017. Her husband shows me a picture taken yesterday during dressing change. The skin graft is looking very good and her plastic surgeon, Dr. Marla Roe said it had healed enough that she could start bending her knee.  Review of Systems: Review of Systems  Constitutional: Negative for chills, diaphoresis and fever.  Gastrointestinal: Negative for abdominal pain, diarrhea, nausea and vomiting.  Musculoskeletal: Positive for joint pain.    Past Medical History:  Diagnosis Date  . 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 Allergic reaction 07/24/2016  . Anemia    needed transfusion spring of 2018  . Arthritis KNEES   right knee, hx. past left knee replacemnt.  . Blood clot in vein    around Portacath right chest-tx. Eliquis about a yr., prior Lovenox.  . Blood dyscrasia   . Chronic anticoagulation HX  PORT-A-CATH CLOT   2010 - HAS BEEN ON LOVENOX SINCE THE CLOT  . Drug rash 07/24/2016  . Elevated blood pressure reading without diagnosis of hypertension    PT MONITORS HER B/P AT HOME  . Heart murmur   . 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 PONV (postoperative nausea and vomiting)    ponv likes scopolamine patch  . Skin cancer of anterior chest BREAST CANCER PRIMARY W/ METS TO CHEST WALL SKIN CANCER--  CHEMOTHERAPY EVERY 3 WEEKS AT [facility]   left 9 yrs ago, chemo  every 3 weeks at [facility], last chemo july 3rd  . Status post skin flap graft    right chest wall with metastatis right anterior chest -no drainage or open wound.    Social History  Substance Use Topics  . Smoking status: Never Smoker  . Smokeless tobacco: Never Used  . Alcohol use No    Family History  Problem Relation Age of Onset  . Heart disease Mother        due to mitral valve regurgiation,   . Cancer Mother        breast  . Allergic rhinitis Mother   . Parkinsonism Father   .  Allergic rhinitis Father   . Migraines Father   . Alcohol abuse Paternal Grandfather   . Angioedema Neg Hx   . Asthma Neg Hx   . Eczema Neg Hx     Allergies  Allergen Reactions  . Penicillins Hives, Itching and Rash    Has patient had a PCN reaction causing immediate rash, facial/tongue/throat swelling, SOB or lightheadedness with hypotension: no Has patient had a PCN reaction causing severe rash involving mucus membranes or skin necrosis: No Has patient had a PCN reaction that required hospitalization No Has patient had a PCN reaction occurring within the last 10 years: yes If all of the above answers are "NO", then may proceed with Cephalosporin use.   Denies airway involvement   . Other Rash    STERI STRIPS - Blisters  . Tape Hives    Can tolerate paper tape    Objective: Vitals:   02/23/17 1555  BP: 98/63  Pulse: 98  Temp: 98.5 F (36.9 C)  TempSrc: Oral  Weight: 156 lb (70.8 kg)   Body mass index is 24.43 kg/m.  Physical Exam  Constitutional:  She is in good spirits.  Musculoskeletal:  Her right knee is wrapped.    Lab Results    Problem List Items Addressed This Visit      High   Infection of prosthetic right knee joint ([place])    I am hopeful that her smoldering enterococcal prosthetic knee infection has been cured following resection arthroplasty and 9 weeks of postoperative antibiotics. She is doing well without any signs of infection 3 weeks after stopping  linezolid. She will follow-up here in one month.          Michel Bickers, MD [facility] [phone] pager   [phone] cell 02/23/2017, 4:24 PM

## 2017-02-23 NOTE — Assessment & Plan Note (Signed)
I am hopeful that her smoldering enterococcal prosthetic knee infection has been cured following resection arthroplasty and 9 weeks of postoperative antibiotics. She is doing well without any signs of infection 3 weeks after stopping linezolid. She will follow-up here in one month.

## 2017-03-02 MED FILL — DARIFENACIN ER 15 MG TABLET: 15 | 30 days supply | Qty: 30 | Fill #3

## 2017-03-03 DIAGNOSIS — C50912 Malignant neoplasm of unspecified site of left female breast: Secondary | ICD-10-CM | POA: Diagnosis not present

## 2017-03-03 DIAGNOSIS — C792 Secondary malignant neoplasm of skin: Secondary | ICD-10-CM | POA: Diagnosis not present

## 2017-03-03 DIAGNOSIS — Z9013 Acquired absence of bilateral breasts and nipples: Secondary | ICD-10-CM | POA: Diagnosis not present

## 2017-03-03 DIAGNOSIS — Z794 Long term (current) use of insulin: Secondary | ICD-10-CM | POA: Diagnosis not present

## 2017-03-03 DIAGNOSIS — I5020 Unspecified systolic (congestive) heart failure: Secondary | ICD-10-CM | POA: Diagnosis not present

## 2017-03-03 DIAGNOSIS — C50412 Malignant neoplasm of upper-outer quadrant of left female breast: Secondary | ICD-10-CM | POA: Diagnosis not present

## 2017-03-03 DIAGNOSIS — Z96651 Presence of right artificial knee joint: Secondary | ICD-10-CM | POA: Diagnosis not present

## 2017-03-03 DIAGNOSIS — E109 Type 1 diabetes mellitus without complications: Secondary | ICD-10-CM | POA: Diagnosis not present

## 2017-03-03 DIAGNOSIS — G629 Polyneuropathy, unspecified: Secondary | ICD-10-CM | POA: Diagnosis not present

## 2017-03-03 DIAGNOSIS — M25561 Pain in right knee: Secondary | ICD-10-CM | POA: Diagnosis not present

## 2017-03-03 DIAGNOSIS — L989 Disorder of the skin and subcutaneous tissue, unspecified: Secondary | ICD-10-CM | POA: Diagnosis not present

## 2017-03-03 MED FILL — ONDANSETRON HCL 8 MG TABLET: 8 | 7 days supply | Qty: 20 | Fill #2

## 2017-03-10 ENCOUNTER — Encounter: Payer: Self-pay | Admitting: Genetics

## 2017-03-10 DIAGNOSIS — C50412 Malignant neoplasm of upper-outer quadrant of left female breast: Secondary | ICD-10-CM | POA: Diagnosis not present

## 2017-03-10 DIAGNOSIS — C792 Secondary malignant neoplasm of skin: Secondary | ICD-10-CM | POA: Diagnosis not present

## 2017-03-16 MED FILL — GABAPENTIN 300 MG CAPSULE: 300 | 30 days supply | Qty: 90 | Fill #1

## 2017-03-17 DIAGNOSIS — R945 Abnormal results of liver function studies: Secondary | ICD-10-CM | POA: Diagnosis not present

## 2017-03-17 DIAGNOSIS — Z171 Estrogen receptor negative status [ER-]: Secondary | ICD-10-CM | POA: Diagnosis not present

## 2017-03-17 DIAGNOSIS — T8453XD Infection and inflammatory reaction due to internal right knee prosthesis, subsequent encounter: Secondary | ICD-10-CM | POA: Diagnosis not present

## 2017-03-17 DIAGNOSIS — Z945 Skin transplant status: Secondary | ICD-10-CM | POA: Diagnosis not present

## 2017-03-17 DIAGNOSIS — Z7901 Long term (current) use of anticoagulants: Secondary | ICD-10-CM | POA: Diagnosis not present

## 2017-03-17 DIAGNOSIS — C50412 Malignant neoplasm of upper-outer quadrant of left female breast: Secondary | ICD-10-CM | POA: Diagnosis not present

## 2017-03-17 DIAGNOSIS — I749 Embolism and thrombosis of unspecified artery: Secondary | ICD-10-CM | POA: Diagnosis not present

## 2017-03-17 DIAGNOSIS — C50912 Malignant neoplasm of unspecified site of left female breast: Secondary | ICD-10-CM | POA: Diagnosis not present

## 2017-03-17 DIAGNOSIS — C792 Secondary malignant neoplasm of skin: Secondary | ICD-10-CM | POA: Diagnosis not present

## 2017-03-17 DIAGNOSIS — I509 Heart failure, unspecified: Secondary | ICD-10-CM | POA: Diagnosis not present

## 2017-03-17 MED FILL — ENOXAPARIN 80 MG/0.8 ML SYR: 80 | 30 days supply | Qty: 24 | Fill #2

## 2017-03-20 DIAGNOSIS — I749 Embolism and thrombosis of unspecified artery: Secondary | ICD-10-CM | POA: Diagnosis not present

## 2017-03-20 DIAGNOSIS — Z7901 Long term (current) use of anticoagulants: Secondary | ICD-10-CM | POA: Diagnosis not present

## 2017-03-23 ENCOUNTER — Ambulatory Visit: Payer: Self-pay | Admitting: Internal Medicine

## 2017-03-24 DIAGNOSIS — Z5111 Encounter for antineoplastic chemotherapy: Secondary | ICD-10-CM | POA: Diagnosis not present

## 2017-03-24 DIAGNOSIS — C792 Secondary malignant neoplasm of skin: Secondary | ICD-10-CM | POA: Diagnosis not present

## 2017-03-24 DIAGNOSIS — C50412 Malignant neoplasm of upper-outer quadrant of left female breast: Secondary | ICD-10-CM | POA: Diagnosis not present

## 2017-03-27 ENCOUNTER — Telehealth: Payer: Self-pay | Admitting: Family Medicine

## 2017-03-27 DIAGNOSIS — T8459XD Infection and inflammatory reaction due to other internal joint prosthesis, subsequent encounter: Secondary | ICD-10-CM | POA: Diagnosis not present

## 2017-03-27 NOTE — Telephone Encounter (Signed)
Yes we should be able to accommodate that for her.

## 2017-03-27 NOTE — Telephone Encounter (Signed)
This should be ok as long as: 1) if [facility] provider they order the labs ahead of time and, pt just needs a lab appt 2) if not a [facility] provider then we would need an order for the labs faxed to [place] with Dx code  Correct?

## 2017-03-27 NOTE — Telephone Encounter (Signed)
Patient has upcoming surgery in which she needs to have lab work done prior.  She would like to know if she can have lab work done at Dr. Virgil Benedict office.

## 2017-03-27 NOTE — Telephone Encounter (Signed)
Called and left a detailed message to advise pt.  

## 2017-03-30 ENCOUNTER — Encounter: Payer: Self-pay | Admitting: Family Medicine

## 2017-04-03 ENCOUNTER — Ambulatory Visit (INDEPENDENT_AMBULATORY_CARE_PROVIDER_SITE_OTHER): Payer: 59 | Admitting: Family Medicine

## 2017-04-03 ENCOUNTER — Encounter: Payer: Self-pay | Admitting: Family Medicine

## 2017-04-03 ENCOUNTER — Other Ambulatory Visit: Payer: Self-pay

## 2017-04-03 VITALS — BP 102/62 | HR 76 | Temp 99.0°F | Resp 16 | Ht 67.0 in | Wt 160.1 lb

## 2017-04-03 DIAGNOSIS — J301 Allergic rhinitis due to pollen: Secondary | ICD-10-CM | POA: Diagnosis not present

## 2017-04-03 MED ORDER — CETIRIZINE HCL 10 MG PO TABS: 10.0000 mg | ORAL_TABLET | Freq: Every day | ORAL | 11 refills | Status: AC

## 2017-04-03 NOTE — Patient Instructions (Signed)
Follow up as needed or as scheduled Start the Zyrtec once daily Drink plenty of fluids Vocal rest will help the hoarseness Call with any questions or concerns Happy Thanksgiving!!! GOOD LUCK WITH SURGERY!!!

## 2017-04-03 NOTE — Progress Notes (Signed)
   Subjective:    Patient ID: [name], female    DOB: Nov 19, 1955, 61 y.o.   MRN: [id number]  HPI Sore throat- pt reports PND, hoarseness.  + sick contacts recently.  No fevers.  Pt is currently undergoing chemo.  No sinus pain/pressure.  No cough.  Not currently taking any allergy medications.  No SOB.   Review of Systems For ROS see HPI     Objective:   Physical Exam  Constitutional: She is oriented to person, place, and time. She appears well-developed and well-nourished. No distress.  HENT:  Head: Normocephalic and atraumatic.  Right Ear: Tympanic membrane normal.  Left Ear: Tympanic membrane normal.  Nose: Mucosal edema and rhinorrhea present. Right sinus exhibits no maxillary sinus tenderness and no frontal sinus tenderness. Left sinus exhibits no maxillary sinus tenderness and no frontal sinus tenderness.  Mouth/Throat: Mucous membranes are normal. Posterior oropharyngeal erythema (w/ PND) present.  Eyes: Conjunctivae and EOM are normal. Pupils are equal, round, and reactive to light.  Neck: Normal range of motion. Neck supple.  Cardiovascular: Normal rate, regular rhythm and normal heart sounds.  Pulmonary/Chest: Effort normal and breath sounds normal. No respiratory distress. She has no wheezes. She has no rales.  Lymphadenopathy:    She has no cervical adenopathy.  Neurological: She is alert and oriented to person, place, and time.  Skin: Skin is warm and dry.  Psychiatric: She has a normal mood and affect. Her behavior is normal.  Vitals reviewed.         Assessment & Plan:  Seasonal allergies- pt's sore throat and hoarseness is due to PND.  Start daily Zyrtec.  Reviewed supportive care and red flags that should prompt return.  Pt expressed understanding and is in agreement w/ plan.

## 2017-04-06 MED FILL — DARIFENACIN ER 15 MG TABLET: 15 | 30 days supply | Qty: 30 | Fill #4

## 2017-04-07 NOTE — Progress Notes (Signed)
Need orders in epic.  Surgery on 04/27/17.

## 2017-04-10 DIAGNOSIS — Z9889 Other specified postprocedural states: Secondary | ICD-10-CM | POA: Diagnosis not present

## 2017-04-10 DIAGNOSIS — C50912 Malignant neoplasm of unspecified site of left female breast: Secondary | ICD-10-CM | POA: Diagnosis not present

## 2017-04-10 NOTE — H&P (Signed)
[name] is an 61 y.o. female.    Chief Complaint:    S/P infection and resection of right total knee arthroplasty   Procedure:  Reimplantation of right TKA vs repeat I&D and placement of antibiotic spacer  HPI: Pt is a 61 y.o. female s/p resection of right TKA due to infection.  She has been treated with IV antibiotics and followed by infectious disease.  Labs were obtained to review her sed rate and CRP, to evaluate for infection. A long discussion was had between the patient and Dr. Alvan Dame with regards to the planned surgery.  I any hint of infection we would clean out the knee and plan for another antibiotic spacer.  Hopefully no infection would be seen and surgery could proceed with a TKA reimplanation.  Various options are discussed with the patient. Risks, benefits and expectations were discussed with the patient. Patient understand the risks, benefits and expectations and wishes to proceed with surgery. Risks, benefits and expectations were discussed with the patient.  She has healed up after receiving a skin graft and the potential risks of damaging the area were discussed.  Risks also including but not limited to the risk of anesthesia, blood clots, nerve damage, blood vessel damage, failure of the prosthesis, infection and up to and including death.  Patient understand the risks, benefits and expectations and wishes to proceed with surgery.   PCP: Midge Minium, MD  D/C Plans:       Home  Post-op Meds:       No Rx given  Tranexamic Acid:      To be given - IV   Decadron:      Is to be given  FYI:     Lovenox (On chronically)  Norco  DME:   Pt already has equipment  PT:   OPPT  /   HHPT   PMH: Past Medical History:  Diagnosis Date  . 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 Allergic reaction 07/24/2016  . Anemia    needed transfusion spring of 2018  . Arthritis KNEES   right knee, hx. past left knee replacemnt.  . Blood clot in vein    around Portacath right  chest-tx. Eliquis about a yr., prior Lovenox.  . Blood dyscrasia   . Chronic anticoagulation HX  PORT-A-CATH CLOT   2010 - HAS BEEN ON LOVENOX SINCE THE CLOT  . Drug rash 07/24/2016  . Elevated blood pressure reading without diagnosis of hypertension    PT MONITORS HER B/P AT HOME  . Heart murmur   . 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 PONV (postoperative nausea and vomiting)    ponv likes scopolamine patch  . 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 Status post skin flap graft    right chest wall with metastatis right anterior chest -no drainage or open wound.    PSH: Past Surgical History:  Procedure Laterality Date  . ARTHROSCOPY KNEE Left 02/13/2012   Performed by Johnn Hai, MD at [facility]  . CELLERATE COLLAGEN PLACEMENT Right 07/31/2016   Performed by Wallace Going, DO at [facility] OR  .  CHEST WALL TUMOR EXCISION     right  . EXCISIONAL TOTAL KNEE ARTHROPLASTY WITH ANTIBIOTIC SPACERS Right 12/02/2016   Performed by Paralee Cancel, MD at [facility] ORS  . hemotoma     evacuation left chest wall  . IRRIGATION AND DEBRIDEMENT KNEE Right 04/12/2016   Performed by Nicholes Stairs, MD at [facility] ORS  . IRRIGATION AND DEBRIDEMENT KNEE WOUND VAC PLACMENT Right 05/28/2016   Performed by Susa Day, MD at [facility] ORS  . IRRIGATION AND DEBRIDEMENT RIGHT KNEE  WOUND Right 07/31/2016   Performed by Wallace Going, DO at [place]  . KNEE ARTHROSCOPY WITH LATERAL MENISECTOMY  02/13/2012   Performed by Johnn Hai, MD at [facility]  . LEFT MODIFIED RADICAL MASTECTOMY/ RIGHT TOTAL MASTECTOMY  11-13-2007   LEFT BREAST CANCER W/ AXILLARY LYMPH NODE METASTASIS AND POST  NEOADJUVANT CHEMO  . LEFT TOTAL KNEE ARTHROPLASTY Left 09/23/2012   Performed by Johnn Hai, MD at [facility] ORS  . MASTECTOMY     Bilateral  . PLACEMENT PORT-A-CATH  06/22/2007   right chest new port placed 2015, right chest  . RADICAL SYNOVECTOMY,IRRIGATION AND DEBRIDEMENT KNEE WITH POLY EXCHANGE WITH ANTIBIOTIC BEADS, APPLICATION OF WOUND VAC Right 05/30/2016   Performed by Susa Day, MD at [facility] ORS  . Repeat irrigation and debridement right knee, wound closure  wound vac REPLACEMENT ANTIBIOTIC SPACERS Right 12/16/2016   Performed by Paralee Cancel, MD at [facility] ORS  . RIGHT TOTAL KNEE ARTHROPLASTY Right 03/20/2016   Performed by Susa Day, MD at [facility] ORS  . SKIN GRAFT     chest-post tumor removal, second occurrence of cancer" 2015 surgery for Flap 2015"  . SKIN GRAFT SPLIT THICKNESS TO RIGHT KNEE WOUND Right 01/29/2017   Performed by Wallace Going, DO at [facility] ORS  . TRANSTHORACIC ECHOCARDIOGRAM  11-18-2011  DR BENSIMHON (ECHO EVERY 3 MONTHS)   HX CHEMO INDUCED CARDIOTOXICITY/  LVSF NORMAL/ EF 55-50%/ MILD MITRIAL VALVE REGURG./ MILDLY INCREASED SYSTOLIC PRESSURE OF PULMONARY ARTERIES  . VASCULAR SURGERY Right    portacath chest    Social History:  reports that  has never smoked. she has never used smokeless tobacco. She reports that she does not drink alcohol or use drugs.  Allergies:  Allergies  Allergen Reactions  . Penicillins Hives, Itching and Rash    Has patient had a PCN reaction causing immediate rash, facial/tongue/throat swelling, SOB or lightheadedness with hypotension: no Has patient had a PCN reaction causing severe rash involving mucus membranes or skin necrosis: No Has patient had a PCN reaction that required hospitalization No Has patient had a PCN reaction occurring within the last 10 years: yes If all of the above answers are "NO", then may proceed with Cephalosporin use.   Denies airway involvement   . Other Rash    STERI STRIPS - Blisters  . Tape Hives     Can tolerate paper tape    Medications: No current facility-administered medications for this encounter.    Current Outpatient Medications  Medication Sig Dispense Refill  . Ado-Trastuzumab Emtansine (KADCYLA IV) Inject into the vein every 21 ( twenty-one) days. At [facility] next treatment due 02-10-17    . cetirizine (ZYRTEC) 10 MG tablet Take 1 tablet (10 mg total) daily by mouth. 30 tablet 11  . Cholecalciferol (VITAMIN D3) 2000 UNITS TABS Take 1 tablet by mouth 4 (four) times a week.     . darifenacin (ENABLEX) 15 MG 24 hr tablet Take 15 mg by  mouth daily.    [name] docusate sodium (COLACE) 100 MG capsule Take 1 capsule (100 mg total) by mouth 2 (two) times daily as needed for mild constipation. 30 capsule 1  . enoxaparin (LOVENOX) 80 MG/0.8ML injection Inject 70 mg into the skin daily.    [name] gabapentin (NEURONTIN) 300 MG capsule TAKE 1 CAPSULE BY MOUTH 3 TIMES DAILY 90 capsule 6  . lidocaine-prilocaine (EMLA) cream APPLY TOPICALLY AS NEEDED  PRECHEMO   APPLY PRETREATMENT TO SKIN OVER PORT; PLACE BARRIER ON TOP OF THE CREAM    . Multiple Vitamin (MULTIVITAMIN WITH MINERALS) TABS tablet Take 1 tablet by mouth daily.    . ondansetron (ZOFRAN) 8 MG tablet Take 1 tablet (8 mg total) by mouth every 8 (eight) hours as needed for nausea. (Patient taking differently: Take 8 mg by mouth every 8 (eight) hours as needed for nausea or vomiting. Uses after chemo treatments) 20 tablet 6  . oxyCODONE (OXY IR/ROXICODONE) 5 MG immediate release tablet Take 1-3 tablets (5-15 mg total) by mouth every 4 (four) hours as needed for severe pain. (Patient taking differently: Take 5-15 mg by mouth every 6 (six) hours as needed for severe pain. ) 90 tablet 0  . zolpidem (AMBIEN) 5 MG tablet TAKE 1 TABLET BY MOUTH ONCE DAILY AT BEDTIME AS NEEDED FOR SLEEP 30 tablet 5      Review of Systems  Constitutional: Positive for malaise/fatigue.  HENT: Negative.   Eyes: Negative.   Respiratory: Negative.   Cardiovascular:  Negative.   Gastrointestinal: Negative.   Genitourinary: Negative.   Musculoskeletal: Positive for joint pain.  Skin: Negative.   Neurological: Negative.   Endo/Heme/Allergies: Negative.   Psychiatric/Behavioral: Negative.        Physical Exam  Constitutional: She is oriented to person, place, and time. She appears well-developed.  HENT:  Head: Normocephalic.  Eyes: Pupils are equal, round, and reactive to light.  Neck: Neck supple. No JVD present. No tracheal deviation present. No thyromegaly present.  Cardiovascular: Normal rate, regular rhythm and intact distal pulses.  Murmur heard. Respiratory: Effort normal and breath sounds normal. No respiratory distress. She has no wheezes.  GI: Soft. There is no tenderness. There is no guarding.  Musculoskeletal:       Right knee: She exhibits decreased range of motion, swelling, deformity, laceration and bony tenderness. She exhibits no ecchymosis and no erythema. Tenderness found.  Lymphadenopathy:    She has no cervical adenopathy.  Neurological: She is alert and oriented to person, place, and time.  Skin: Skin is warm and dry.  Psychiatric: She has a normal mood and affect.       Assessment/Plan Assessment: S/P infection and resection of right total knee arthroplasty  Plan: Patient will undergo a reimplantation of right TKA vs repeat I&D and placement of antibiotic spacer on 04/27/2017 per Dr. Alvan Dame at [facility]. Risks benefits and expectations were discussed with the patient. Patient understand risks, benefits and expectations and wishes to proceed.   West Pugh Babish   PA-C  04/10/2017, 12:46 PM

## 2017-04-13 MED FILL — GABAPENTIN 300 MG CAPSULE: 300 | 30 days supply | Qty: 90 | Fill #2

## 2017-04-14 DIAGNOSIS — C50412 Malignant neoplasm of upper-outer quadrant of left female breast: Secondary | ICD-10-CM | POA: Diagnosis not present

## 2017-04-14 DIAGNOSIS — C792 Secondary malignant neoplasm of skin: Secondary | ICD-10-CM | POA: Diagnosis not present

## 2017-04-14 DIAGNOSIS — Z5112 Encounter for antineoplastic immunotherapy: Secondary | ICD-10-CM | POA: Diagnosis not present

## 2017-04-15 ENCOUNTER — Encounter (HOSPITAL_COMMUNITY): Payer: Self-pay | Admitting: Internal Medicine

## 2017-04-15 ENCOUNTER — Ambulatory Visit (HOSPITAL_COMMUNITY): Admission: RE | Admit: 2017-04-15 | Payer: 59 | Source: Ambulatory Visit

## 2017-04-17 ENCOUNTER — Ambulatory Visit (HOSPITAL_COMMUNITY): Admission: RE | Admit: 2017-04-17 | Payer: 59 | Source: Ambulatory Visit

## 2017-04-23 ENCOUNTER — Other Ambulatory Visit (HOSPITAL_COMMUNITY): Payer: Self-pay | Admitting: Emergency Medicine

## 2017-04-23 ENCOUNTER — Other Ambulatory Visit: Payer: Self-pay | Admitting: *Deleted

## 2017-04-23 NOTE — Patient Instructions (Signed)
[name]  04/23/2017   Your procedure is scheduled on: 04-27-17  Report to [facility] Main  Entrance    Report to admitting at 930AM   Call this number if you have problems the morning of surgery [phone]   Remember: ONLY 1 PERSON MAY GO WITH YOU TO SHORT STAY TO GET  READY MORNING OF YOUR SURGERY.  Do not eat food or drink liquids :After Midnight.     Take these medicines the morning of surgery with A SIP OF WATER: GABAPENTIN, CETIRIZINE                                 You may not have any metal on your body including hair pins and              piercings  Do not wear jewelry, make-up, lotions, powders or perfumes, deodorant             Do not wear nail polish.  Do not shave  48 hours prior to surgery.     Do not bring valuables to the hospital. [place].  Contacts, dentures or bridgework may not be worn into surgery.  Leave suitcase in the car. After surgery it may be brought to your room.                 Please read over the following fact sheets you were given: _____________________________________________________________________            [facility] - Preparing for Surgery Before surgery, you can play an important role.  Because skin is not sterile, your skin needs to be as free of germs as possible.  You can reduce the number of germs on your skin by washing with CHG (chlorahexidine gluconate) soap before surgery.  CHG is an antiseptic cleaner which kills germs and bonds with the skin to continue killing germs even after washing. Please DO NOT use if you have an allergy to CHG or antibacterial soaps.  If your skin becomes reddened/irritated stop using the CHG and inform your nurse when you arrive at Short Stay. Do not shave (including legs and underarms) for at least 48 hours prior to the first CHG shower.  You may shave your face/neck. Please follow these instructions carefully:  1.   Shower with CHG Soap the night before surgery and the  morning of Surgery.  2.  If you choose to wash your hair, wash your hair first as usual with your  normal  shampoo.  3.  After you shampoo, rinse your hair and body thoroughly to remove the  shampoo.                           4.  Use CHG as you would any other liquid soap.  You can apply chg directly  to the skin and wash                       Gently with a scrungie or clean washcloth.  5.  Apply the CHG Soap to your body ONLY FROM THE NECK DOWN.   Do not use on face/ open  Wound or open sores. Avoid contact with eyes, ears mouth and genitals (private parts).                       Wash face,  Genitals (private parts) with your normal soap.             6.  Wash thoroughly, paying special attention to the area where your surgery  will be performed.  7.  Thoroughly rinse your body with warm water from the neck down.  8.  DO NOT shower/wash with your normal soap after using and rinsing off  the CHG Soap.                9.  Pat yourself dry with a clean towel.            10.  Wear clean pajamas.            11.  Place clean sheets on your bed the night of your first shower and do not  sleep with pets. Day of Surgery : Do not apply any lotions/deodorants the morning of surgery.  Please wear clean clothes to the hospital/surgery center.  FAILURE TO FOLLOW THESE INSTRUCTIONS MAY RESULT IN THE CANCELLATION OF YOUR SURGERY PATIENT SIGNATURE_________________________________  NURSE SIGNATURE__________________________________  ________________________________________________________________________   [name]  An incentive spirometer is a tool that can help keep your lungs clear and active. This tool measures how well you are filling your lungs with each breath. Taking long deep breaths may help reverse or decrease the chance of developing breathing (pulmonary) problems (especially infection) following:  A long  period of time when you are unable to move or be active. BEFORE THE PROCEDURE   If the spirometer includes an indicator to show your best effort, your nurse or respiratory therapist will set it to a desired goal.  If possible, sit up straight or lean slightly forward. Try not to slouch.  Hold the incentive spirometer in an upright position. INSTRUCTIONS FOR USE  1. Sit on the edge of your bed if possible, or sit up as far as you can in bed or on a chair. 2. Hold the incentive spirometer in an upright position. 3. Breathe out normally. 4. Place the mouthpiece in your mouth and seal your lips tightly around it. 5. Breathe in slowly and as deeply as possible, raising the piston or the ball toward the top of the column. 6. Hold your breath for 3-5 seconds or for as long as possible. Allow the piston or ball to fall to the bottom of the column. 7. Remove the mouthpiece from your mouth and breathe out normally. 8. Rest for a few seconds and repeat Steps 1 through 7 at least 10 times every 1-2 hours when you are awake. Take your time and take a few normal breaths between deep breaths. 9. The spirometer may include an indicator to show your best effort. Use the indicator as a goal to work toward during each repetition. 10. After each set of 10 deep breaths, practice coughing to be sure your lungs are clear. If you have an incision (the cut made at the time of surgery), support your incision when coughing by placing a pillow or rolled up towels firmly against it. Once you are able to get out of bed, walk around indoors and cough well. You may stop using the incentive spirometer when instructed by your caregiver.  RISKS AND COMPLICATIONS  Take your time so you do not get  dizzy or light-headed.  If you are in pain, you may need to take or ask for pain medication before doing incentive spirometry. It is harder to take a deep breath if you are having pain. AFTER USE  Rest and breathe slowly and  easily.  It can be helpful to keep track of a log of your progress. Your caregiver can provide you with a simple table to help with this. If you are using the spirometer at home, follow these instructions: [place] IF:   You are having difficultly using the spirometer.  You have trouble using the spirometer as often as instructed.  Your pain medication is not giving enough relief while using the spirometer.  You develop fever of 100.5 F (38.1 C) or higher. SEEK IMMEDIATE MEDICAL CARE IF:   You cough up bloody sputum that had not been present before.  You develop fever of 102 F (38.9 C) or greater.  You develop worsening pain at or near the incision site. MAKE SURE YOU:   Understand these instructions.  Will watch your condition.  Will get help right away if you are not doing well or get worse. Document Released: 09/22/2006 Document Revised: 08/04/2011 Document Reviewed: 11/23/2006 ExitCare Patient Information 2014 ExitCare, [place].   ________________________________________________________________________  WHAT IS A BLOOD TRANSFUSION? Blood Transfusion Information  A transfusion is the replacement of blood or some of its parts. Blood is made up of multiple cells which provide different functions.  Red blood cells carry oxygen and are used for blood loss replacement.  White blood cells fight against infection.  Platelets control bleeding.  Plasma helps clot blood.  Other blood products are available for specialized needs, such as hemophilia or other clotting disorders. BEFORE THE TRANSFUSION  Who gives blood for transfusions?   Healthy volunteers who are fully evaluated to make sure their blood is safe. This is blood bank blood. Transfusion therapy is the safest it has ever been in the practice of medicine. Before blood is taken from a donor, a complete history is taken to make sure that person has no history of diseases nor engages in risky social  behavior (examples are intravenous drug use or sexual activity with multiple partners). The donor's travel history is screened to minimize risk of transmitting infections, such as malaria. The donated blood is tested for signs of infectious diseases, such as HIV and hepatitis. The blood is then tested to be sure it is compatible with you in order to minimize the chance of a transfusion reaction. If you or a relative donates blood, this is often done in anticipation of surgery and is not appropriate for emergency situations. It takes many days to process the donated blood. RISKS AND COMPLICATIONS Although transfusion therapy is very safe and saves many lives, the main dangers of transfusion include:   Getting an infectious disease.  Developing a transfusion reaction. This is an allergic reaction to something in the blood you were given. Every precaution is taken to prevent this. The decision to have a blood transfusion has been considered carefully by your caregiver before blood is given. Blood is not given unless the benefits outweigh the risks. AFTER THE TRANSFUSION  Right after receiving a blood transfusion, you will usually feel much better and more energetic. This is especially true if your red blood cells have gotten low (anemic). The transfusion raises the level of the red blood cells which carry oxygen, and this usually causes an energy increase.  The nurse administering the transfusion will  monitor you carefully for complications. HOME CARE INSTRUCTIONS  No special instructions are needed after a transfusion. You may find your energy is better. Speak with your caregiver about any limitations on activity for underlying diseases you may have. SEEK MEDICAL CARE IF:   Your condition is not improving after your transfusion.  You develop redness or irritation at the intravenous (IV) site. SEEK IMMEDIATE MEDICAL CARE IF:  Any of the following symptoms occur over the next 12 hours:  Shaking  chills.  You have a temperature by mouth above 102 F (38.9 C), not controlled by medicine.  Chest, back, or muscle pain.  People around you feel you are not acting correctly or are confused.  Shortness of breath or difficulty breathing.  Dizziness and fainting.  You get a rash or develop hives.  You have a decrease in urine output.  Your urine turns a dark color or changes to pink, red, or brown. Any of the following symptoms occur over the next 10 days:  You have a temperature by mouth above 102 F (38.9 C), not controlled by medicine.  Shortness of breath.  Weakness after normal activity.  The white part of the eye turns yellow (jaundice).  You have a decrease in the amount of urine or are urinating less often.  Your urine turns a dark color or changes to pink, red, or brown. Document Released: 05/09/2000 Document Revised: 08/04/2011 Document Reviewed: 12/27/2007 [facility] Patient Information 2014 [place], [place].  _______________________________________________________________________

## 2017-04-23 NOTE — Patient Outreach (Signed)
[facility] [facility]) Care Management  04/23/2017  [name] [birth year] [id number]   Subjective: Telephone call to patient's home number, spoke with patient, and HIPAA verified.  Discussed [facility] Care Management UMR Transition of care follow up, preoperative call follow up, patient voiced understanding, and is in agreement to both types of follow up. Patient states she is doing well, ready for surgery on 04/27/17 at [facility], no estimated length of stay given due to complexed medical history, has assistance from husband, and daughter (whom is a Marine scientist) for activities of daily living [facility] management as needed. States she is accessing the following [facility] benefits: outpatient pharmacy, hospital indemnity (states she will file claims as appropriate), and has long term disability in place.  Patient states she does not have any preoperative questions, care coordination, disease management, disease monitoring, transportation, community resource, or pharmacy needs at this time.   States she is very appreciative of the follow up and is in agreement to receive [place] Management information post transition of care follow up.    Objective:Per KPN (Knowledge Performance Now, point of care tool) and chart review, patient to be admitted to [facility] on 04/27/17 for Reimplantation of right total knee arthroplasty versus repeat irrigation and debridement and placement of antibiotic spacer.     Patient hospitalized 12/12/16 - 12/19/16 for anemia.   Patient hospitalized 12/02/16 -12/05/16 for Infected right total knee arthroplasty. Status post EXCISIONAL TOTAL KNEE ARTHROPLASTY WITH ANTIBIOTIC SPACERS on 12/02/16. Status post Resection of right total knee arthroplasty, placement of antibiotic articulating spacer on 12/03/16. Patient also hospitalized 03/20/16 -03/23/16 for End-stage osteoarthrosis, valgus deformity of the right knee.Status post Right total knee arthroplasty on 03/20/16.  Patient has a history of BREAST CANCER PRIMARY W/ METS TO CHEST WALL SKIN CANCER-- CHEMOTHERAPY EVERY 3 WEEKS AT [facility].  [facility] Care Management transition of care follow up completed on 12/29/16.   Assessment: Received UMR Preoperative / Transition of care referral on 04/20/17. Preoperative call completed, and transition of care follow up pending notification of patient discharge.    Plan:RNCM will call patient for  telephone outreach attempt, transition of care follow up, within 3 business days of hospital discharge notification.     Sumayah Bearse H. Annia Friendly, BSN, [place] Management [facility] Telephonic CM Phone: [phone] Fax: [phone]

## 2017-04-23 NOTE — Progress Notes (Addendum)
CT chest 03-10-17 epic care everywhere  ECHO 11-19-16 epic  EKG 12-12-16 epic  CBCdiff, CMP 04-14-17 epic care everywhere  [place] cardiology 03-03-16 epic

## 2017-04-24 ENCOUNTER — Encounter (HOSPITAL_COMMUNITY)
Admission: RE | Admit: 2017-04-24 | Discharge: 2017-04-24 | Disposition: A | Discharge status: Home / Self Care | Payer: 59 | Source: Ambulatory Visit | Attending: Orthopedic Surgery | Admitting: Orthopedic Surgery

## 2017-04-24 ENCOUNTER — Encounter (HOSPITAL_COMMUNITY): Payer: Self-pay | Admitting: Emergency Medicine

## 2017-04-24 ENCOUNTER — Ambulatory Visit (HOSPITAL_BASED_OUTPATIENT_CLINIC_OR_DEPARTMENT_OTHER)
Admission: RE | Admit: 2017-04-24 | Discharge: 2017-04-24 | Disposition: A | Discharge status: Home / Self Care | Payer: 59 | Source: Ambulatory Visit | Attending: Internal Medicine | Admitting: Internal Medicine

## 2017-04-24 ENCOUNTER — Other Ambulatory Visit: Payer: Self-pay

## 2017-04-24 DIAGNOSIS — C7989 Secondary malignant neoplasm of other specified sites: Secondary | ICD-10-CM | POA: Diagnosis not present

## 2017-04-24 DIAGNOSIS — Z01818 Encounter for other preprocedural examination: Secondary | ICD-10-CM | POA: Insufficient documentation

## 2017-04-24 DIAGNOSIS — Z79899 Other long term (current) drug therapy: Secondary | ICD-10-CM | POA: Diagnosis not present

## 2017-04-24 DIAGNOSIS — Z9221 Personal history of antineoplastic chemotherapy: Secondary | ICD-10-CM | POA: Diagnosis not present

## 2017-04-24 DIAGNOSIS — I5030 Unspecified diastolic (congestive) heart failure: Secondary | ICD-10-CM

## 2017-04-24 DIAGNOSIS — I081 Rheumatic disorders of both mitral and tricuspid valves: Secondary | ICD-10-CM | POA: Insufficient documentation

## 2017-04-24 DIAGNOSIS — T8453XD Infection and inflammatory reaction due to internal right knee prosthesis, subsequent encounter: Secondary | ICD-10-CM | POA: Diagnosis not present

## 2017-04-24 DIAGNOSIS — Z96652 Presence of left artificial knee joint: Secondary | ICD-10-CM | POA: Diagnosis not present

## 2017-04-24 DIAGNOSIS — Z91048 Other nonmedicinal substance allergy status: Secondary | ICD-10-CM | POA: Diagnosis not present

## 2017-04-24 DIAGNOSIS — Z9013 Acquired absence of bilateral breasts and nipples: Secondary | ICD-10-CM | POA: Diagnosis not present

## 2017-04-24 DIAGNOSIS — Z880 Allergy status to penicillin: Secondary | ICD-10-CM | POA: Diagnosis not present

## 2017-04-24 DIAGNOSIS — Z853 Personal history of malignant neoplasm of breast: Secondary | ICD-10-CM | POA: Diagnosis not present

## 2017-04-24 HISTORY — DX: Changes in skin texture: R23.4

## 2017-04-24 HISTORY — DX: Adverse effect of antineoplastic and immunosuppressive drugs, initial encounter: T45.1X5A

## 2017-04-24 HISTORY — DX: Presence of other vascular implants and grafts: Z95.828

## 2017-04-24 HISTORY — DX: Unspecified skin changes: R23.9

## 2017-04-24 HISTORY — PX: TRANSTHORACIC ECHOCARDIOGRAM: SHX275

## 2017-04-24 LAB — CBC
HCT: 39 % (ref 36.0–46.0)
Hemoglobin: 12.8 g/dL (ref 12.0–15.0)
MCH: 31.5 pg (ref 26.0–34.0)
MCHC: 32.8 g/dL (ref 30.0–36.0)
MCV: 96.1 fL (ref 78.0–100.0)
PLATELETS: 197 10*3/uL (ref 150–400)
RBC: 4.06 MIL/uL (ref 3.87–5.11)
RDW: 16.3 % — AB (ref 11.5–15.5)
WBC: 6.5 10*3/uL (ref 4.0–10.5)

## 2017-04-24 LAB — BASIC METABOLIC PANEL
ANION GAP: 9 (ref 5–15)
BUN: 11 mg/dL (ref 6–20)
CALCIUM: 9.7 mg/dL (ref 8.9–10.3)
CO2: 28 mmol/L (ref 22–32)
Chloride: 101 mmol/L (ref 101–111)
Creatinine, Ser: 0.54 mg/dL (ref 0.44–1.00)
GLUCOSE: 90 mg/dL (ref 65–99)
Potassium: 3.9 mmol/L (ref 3.5–5.1)
SODIUM: 138 mmol/L (ref 135–145)

## 2017-04-24 LAB — SURGICAL PCR SCREEN
MRSA, PCR: NEGATIVE
STAPHYLOCOCCUS AUREUS: NEGATIVE

## 2017-04-24 NOTE — Progress Notes (Signed)
  Echocardiogram 2D Echocardiogram has been performed.  Darlina Sicilian M 04/24/2017, 9:21 AM

## 2017-04-24 NOTE — Progress Notes (Signed)
ECHO 04-24-17 Epic

## 2017-04-27 ENCOUNTER — Inpatient Hospital Stay (HOSPITAL_COMMUNITY): Payer: 59 | Admitting: Anesthesiology

## 2017-04-27 ENCOUNTER — Other Ambulatory Visit: Payer: Self-pay

## 2017-04-27 ENCOUNTER — Encounter (HOSPITAL_COMMUNITY): Payer: Self-pay | Admitting: *Deleted

## 2017-04-27 ENCOUNTER — Inpatient Hospital Stay (HOSPITAL_COMMUNITY)
Admission: RE | Admit: 2017-04-27 | Discharge: 2017-04-30 | DRG: 940 | Disposition: A | Discharge status: Home Health | Payer: 59 | Source: Ambulatory Visit | Attending: Orthopedic Surgery | Admitting: Orthopedic Surgery

## 2017-04-27 ENCOUNTER — Encounter (HOSPITAL_COMMUNITY)
Admission: RE | Disposition: A | Discharge status: Home Health | Payer: Self-pay | Source: Ambulatory Visit | Attending: Orthopedic Surgery

## 2017-04-27 DIAGNOSIS — D62 Acute posthemorrhagic anemia: Secondary | ICD-10-CM | POA: Diagnosis not present

## 2017-04-27 DIAGNOSIS — G8918 Other acute postprocedural pain: Secondary | ICD-10-CM | POA: Diagnosis not present

## 2017-04-27 DIAGNOSIS — Z79899 Other long term (current) drug therapy: Secondary | ICD-10-CM | POA: Diagnosis not present

## 2017-04-27 DIAGNOSIS — Z9013 Acquired absence of bilateral breasts and nipples: Secondary | ICD-10-CM

## 2017-04-27 DIAGNOSIS — Z91048 Other nonmedicinal substance allergy status: Secondary | ICD-10-CM | POA: Diagnosis not present

## 2017-04-27 DIAGNOSIS — L089 Local infection of the skin and subcutaneous tissue, unspecified: Secondary | ICD-10-CM

## 2017-04-27 DIAGNOSIS — T8450XS Infection and inflammatory reaction due to unspecified internal joint prosthesis, sequela: Secondary | ICD-10-CM

## 2017-04-27 DIAGNOSIS — Y831 Surgical operation with implant of artificial internal device as the cause of abnormal reaction of the patient, or of later complication, without mention of misadventure at the time of the procedure: Secondary | ICD-10-CM | POA: Diagnosis present

## 2017-04-27 DIAGNOSIS — Z880 Allergy status to penicillin: Secondary | ICD-10-CM | POA: Diagnosis not present

## 2017-04-27 DIAGNOSIS — C7989 Secondary malignant neoplasm of other specified sites: Secondary | ICD-10-CM | POA: Diagnosis present

## 2017-04-27 DIAGNOSIS — Z96652 Presence of left artificial knee joint: Secondary | ICD-10-CM | POA: Diagnosis present

## 2017-04-27 DIAGNOSIS — Z8619 Personal history of other infectious and parasitic diseases: Secondary | ICD-10-CM | POA: Diagnosis not present

## 2017-04-27 DIAGNOSIS — T8453XD Infection and inflammatory reaction due to internal right knee prosthesis, subsequent encounter: Principal | ICD-10-CM

## 2017-04-27 DIAGNOSIS — Z803 Family history of malignant neoplasm of breast: Secondary | ICD-10-CM

## 2017-04-27 DIAGNOSIS — C50919 Malignant neoplasm of unspecified site of unspecified female breast: Secondary | ICD-10-CM | POA: Diagnosis not present

## 2017-04-27 DIAGNOSIS — S80259A Superficial foreign body, unspecified knee, initial encounter: Secondary | ICD-10-CM

## 2017-04-27 DIAGNOSIS — Z9221 Personal history of antineoplastic chemotherapy: Secondary | ICD-10-CM

## 2017-04-27 DIAGNOSIS — Z853 Personal history of malignant neoplasm of breast: Secondary | ICD-10-CM | POA: Diagnosis not present

## 2017-04-27 DIAGNOSIS — Z96651 Presence of right artificial knee joint: Secondary | ICD-10-CM | POA: Diagnosis not present

## 2017-04-27 DIAGNOSIS — Z452 Encounter for adjustment and management of vascular access device: Secondary | ICD-10-CM | POA: Diagnosis not present

## 2017-04-27 DIAGNOSIS — Y838 Other surgical procedures as the cause of abnormal reaction of the patient, or of later complication, without mention of misadventure at the time of the procedure: Secondary | ICD-10-CM | POA: Diagnosis not present

## 2017-04-27 DIAGNOSIS — M1711 Unilateral primary osteoarthritis, right knee: Secondary | ICD-10-CM | POA: Diagnosis not present

## 2017-04-27 DIAGNOSIS — A491 Streptococcal infection, unspecified site: Secondary | ICD-10-CM

## 2017-04-27 DIAGNOSIS — N390 Urinary tract infection, site not specified: Secondary | ICD-10-CM | POA: Diagnosis not present

## 2017-04-27 HISTORY — PX: REIMPLANTATION OF TOTAL KNEE: SHX6052

## 2017-04-27 LAB — TYPE AND SCREEN
ABO/RH(D): A POS
ABO/RH(D): A POS
ANTIBODY SCREEN: POSITIVE
Antibody Screen: POSITIVE
DAT, IGG: POSITIVE
PT AG Type: NEGATIVE

## 2017-04-27 SURGERY — REVISION, TOTAL ARTHROPLASTY, KNEE
Anesthesia: General | Site: Knee | Laterality: Right

## 2017-04-27 MED ORDER — METHOCARBAMOL 500 MG PO TABS
500.0000 mg | ORAL_TABLET | Freq: Four times a day (QID) | ORAL | Status: DC | PRN
  Administered 2017-04-27 – 2017-04-30 (×7): 500 mg via ORAL
  Filled 2017-04-27 (×7): qty 1

## 2017-04-27 MED ORDER — ACETAMINOPHEN 325 MG PO TABS: 650.0000 mg | ORAL_TABLET | ORAL | Status: DC | PRN

## 2017-04-27 MED ORDER — SODIUM CHLORIDE 0.9 % IJ SOLN
INTRAMUSCULAR | Status: AC
  Filled 2017-04-27: qty 50

## 2017-04-27 MED ORDER — PROMETHAZINE HCL 25 MG/ML IJ SOLN: 6.2500 mg | INTRAMUSCULAR | Status: DC | PRN

## 2017-04-27 MED ORDER — ONDANSETRON HCL 4 MG/2ML IJ SOLN
INTRAMUSCULAR | Status: AC
  Filled 2017-04-27: qty 2

## 2017-04-27 MED ORDER — MEPERIDINE HCL 50 MG/ML IJ SOLN: 6.2500 mg | INTRAMUSCULAR | Status: DC | PRN

## 2017-04-27 MED ORDER — DOCUSATE SODIUM 100 MG PO CAPS: 100.0000 mg | ORAL_CAPSULE | Freq: Two times a day (BID) | ORAL | 0 refills | Status: DC

## 2017-04-27 MED ORDER — ALUM & MAG HYDROXIDE-SIMETH 200-200-20 MG/5ML PO SUSP: 15.0000 mL | ORAL | Status: DC | PRN

## 2017-04-27 MED ORDER — PHENYLEPHRINE 40 MCG/ML (10ML) SYRINGE FOR IV PUSH (FOR BLOOD PRESSURE SUPPORT)
PREFILLED_SYRINGE | INTRAVENOUS | Status: AC
  Filled 2017-04-27: qty 10

## 2017-04-27 MED ORDER — ZOLPIDEM TARTRATE 5 MG PO TABS: 5.0000 mg | ORAL_TABLET | Freq: Every evening | ORAL | Status: DC | PRN

## 2017-04-27 MED ORDER — VANCOMYCIN HCL IN DEXTROSE 1-5 GM/200ML-% IV SOLN
1000.0000 mg | INTRAVENOUS | Status: AC
  Administered 2017-04-27: 1000 mg via INTRAVENOUS

## 2017-04-27 MED ORDER — MENTHOL 3 MG MT LOZG: 1.0000 | LOZENGE | OROMUCOSAL | Status: DC | PRN

## 2017-04-27 MED ORDER — VANCOMYCIN HCL IN DEXTROSE 1-5 GM/200ML-% IV SOLN
1000.0000 mg | Freq: Two times a day (BID) | INTRAVENOUS | Status: AC
  Administered 2017-04-27: 1000 mg via INTRAVENOUS
  Filled 2017-04-27: qty 200

## 2017-04-27 MED ORDER — SCOPOLAMINE 1 MG/3DAYS TD PT72: 1.0000 | MEDICATED_PATCH | Freq: Once | TRANSDERMAL | Status: DC

## 2017-04-27 MED ORDER — BISACODYL 10 MG RE SUPP: 10.0000 mg | Freq: Every day | RECTAL | Status: DC | PRN

## 2017-04-27 MED ORDER — ONDANSETRON HCL 4 MG/2ML IJ SOLN
INTRAMUSCULAR | Status: DC | PRN
  Administered 2017-04-27: 4 mg via INTRAVENOUS

## 2017-04-27 MED ORDER — TRANEXAMIC ACID 1000 MG/10ML IV SOLN
1000.0000 mg | Freq: Once | INTRAVENOUS | Status: AC
  Administered 2017-04-27: 1000 mg via INTRAVENOUS
  Filled 2017-04-27: qty 1100

## 2017-04-27 MED ORDER — SCOPOLAMINE 1 MG/3DAYS TD PT72
MEDICATED_PATCH | TRANSDERMAL | Status: AC
  Administered 2017-04-27: 1.5 mg
  Filled 2017-04-27: qty 1

## 2017-04-27 MED ORDER — BUPIVACAINE-EPINEPHRINE (PF) 0.5% -1:200000 IJ SOLN
INTRAMUSCULAR | Status: DC | PRN
  Administered 2017-04-27 (×6): 5 mL via PERINEURAL

## 2017-04-27 MED ORDER — ONDANSETRON HCL 4 MG/2ML IJ SOLN: 4.0000 mg | Freq: Four times a day (QID) | INTRAMUSCULAR | Status: DC | PRN

## 2017-04-27 MED ORDER — MAGNESIUM CITRATE PO SOLN: 1.0000 | Freq: Once | ORAL | Status: DC | PRN

## 2017-04-27 MED ORDER — MIDAZOLAM HCL 2 MG/2ML IJ SOLN
2.0000 mg | Freq: Once | INTRAMUSCULAR | Status: AC
  Administered 2017-04-27 (×2): 1 mg via INTRAVENOUS

## 2017-04-27 MED ORDER — SODIUM CHLORIDE 0.9 % IR SOLN
Status: DC | PRN
  Administered 2017-04-27: 5000 mL

## 2017-04-27 MED ORDER — FENTANYL CITRATE (PF) 100 MCG/2ML IJ SOLN
INTRAMUSCULAR | Status: AC
  Filled 2017-04-27: qty 2

## 2017-04-27 MED ORDER — FENTANYL CITRATE (PF) 100 MCG/2ML IJ SOLN
INTRAMUSCULAR | Status: AC
  Administered 2017-04-27: 50 ug via INTRAVENOUS
  Filled 2017-04-27: qty 2

## 2017-04-27 MED ORDER — DOCUSATE SODIUM 100 MG PO CAPS
100.0000 mg | ORAL_CAPSULE | Freq: Two times a day (BID) | ORAL | Status: DC
  Administered 2017-04-27 – 2017-04-30 (×6): 100 mg via ORAL
  Filled 2017-04-27 (×6): qty 1

## 2017-04-27 MED ORDER — SODIUM CHLORIDE 0.9 % IJ SOLN
INTRAMUSCULAR | Status: DC | PRN
  Administered 2017-04-27: 29 mL

## 2017-04-27 MED ORDER — STERILE WATER FOR IRRIGATION IR SOLN
Status: DC | PRN
  Administered 2017-04-27: 2000 mL

## 2017-04-27 MED ORDER — OXYCODONE HCL 5 MG PO TABS
5.0000 mg | ORAL_TABLET | ORAL | Status: DC | PRN
  Administered 2017-04-27: 5 mg via ORAL
  Filled 2017-04-27: qty 1

## 2017-04-27 MED ORDER — MIDAZOLAM HCL 2 MG/2ML IJ SOLN: 1.0000 mg | INTRAMUSCULAR | Status: DC

## 2017-04-27 MED ORDER — HYDROMORPHONE HCL 1 MG/ML IJ SOLN
0.5000 mg | INTRAMUSCULAR | Status: DC | PRN
  Administered 2017-04-27 (×2): 1 mg via INTRAVENOUS
  Administered 2017-04-29: 0.5 mg via INTRAVENOUS
  Filled 2017-04-27 (×3): qty 1

## 2017-04-27 MED ORDER — LIDOCAINE 2% (20 MG/ML) 5 ML SYRINGE
INTRAMUSCULAR | Status: AC
  Filled 2017-04-27: qty 5

## 2017-04-27 MED ORDER — HYDROMORPHONE HCL 1 MG/ML IJ SOLN
0.2500 mg | INTRAMUSCULAR | Status: DC | PRN
Start: 2017-04-27 — End: 2017-04-27

## 2017-04-27 MED ORDER — PROPOFOL 10 MG/ML IV BOLUS
INTRAVENOUS | Status: DC | PRN
  Administered 2017-04-27: 140 mg via INTRAVENOUS

## 2017-04-27 MED ORDER — LIDOCAINE 2% (20 MG/ML) 5 ML SYRINGE
INTRAMUSCULAR | Status: DC | PRN
  Administered 2017-04-27: 100 mg via INTRAVENOUS

## 2017-04-27 MED ORDER — LORATADINE 10 MG PO TABS
10.0000 mg | ORAL_TABLET | Freq: Every day | ORAL | Status: DC
  Administered 2017-04-28 – 2017-04-30 (×3): 10 mg via ORAL
  Filled 2017-04-27 (×3): qty 1

## 2017-04-27 MED ORDER — 0.9 % SODIUM CHLORIDE (POUR BTL) OPTIME
TOPICAL | Status: DC | PRN
  Administered 2017-04-27: 1000 mL

## 2017-04-27 MED ORDER — PROPOFOL 10 MG/ML IV BOLUS: INTRAVENOUS | Status: DC | PRN

## 2017-04-27 MED ORDER — TRANEXAMIC ACID 1000 MG/10ML IV SOLN
1000.0000 mg | INTRAVENOUS | Status: AC
  Administered 2017-04-27: 1000 mg via INTRAVENOUS
  Filled 2017-04-27: qty 1100
  Filled 2017-04-27: qty 10

## 2017-04-27 MED ORDER — OXYCODONE HCL 5 MG PO TABS: 5.0000 mg | ORAL_TABLET | ORAL | 0 refills | Status: DC | PRN

## 2017-04-27 MED ORDER — ENOXAPARIN SODIUM 40 MG/0.4ML ~~LOC~~ SOLN
40.0000 mg | SUBCUTANEOUS | Status: AC
  Administered 2017-04-28 – 2017-04-29 (×2): 40 mg via SUBCUTANEOUS
  Filled 2017-04-27 (×2): qty 0.4

## 2017-04-27 MED ORDER — PHENOL 1.4 % MT LIQD: 1.0000 | OROMUCOSAL | Status: DC | PRN

## 2017-04-27 MED ORDER — DEXAMETHASONE SODIUM PHOSPHATE 10 MG/ML IJ SOLN
INTRAMUSCULAR | Status: AC
  Filled 2017-04-27: qty 1

## 2017-04-27 MED ORDER — KETOROLAC TROMETHAMINE 30 MG/ML IJ SOLN: 30.0000 mg | Freq: Once | INTRAMUSCULAR | Status: DC | PRN

## 2017-04-27 MED ORDER — SODIUM CHLORIDE 0.9 % IV SOLN
INTRAVENOUS | Status: DC
  Administered 2017-04-27 – 2017-04-29 (×3): via INTRAVENOUS

## 2017-04-27 MED ORDER — MIDAZOLAM HCL 2 MG/2ML IJ SOLN
INTRAMUSCULAR | Status: AC
  Administered 2017-04-27: 1 mg via INTRAVENOUS
  Filled 2017-04-27: qty 2

## 2017-04-27 MED ORDER — ONDANSETRON HCL 4 MG PO TABS: 4.0000 mg | ORAL_TABLET | Freq: Four times a day (QID) | ORAL | Status: DC | PRN

## 2017-04-27 MED ORDER — KETOROLAC TROMETHAMINE 30 MG/ML IJ SOLN
INTRAMUSCULAR | Status: DC | PRN
  Administered 2017-04-27: 30 mg

## 2017-04-27 MED ORDER — BUPIVACAINE-EPINEPHRINE (PF) 0.25% -1:200000 IJ SOLN
INTRAMUSCULAR | Status: DC | PRN
  Administered 2017-04-27: 30 mL via PERINEURAL

## 2017-04-27 MED ORDER — DEXAMETHASONE SODIUM PHOSPHATE 4 MG/ML IJ SOLN
INTRAMUSCULAR | Status: DC | PRN
  Administered 2017-04-27: 10 mg via INTRAVENOUS

## 2017-04-27 MED ORDER — DEXAMETHASONE SODIUM PHOSPHATE 10 MG/ML IJ SOLN
10.0000 mg | Freq: Once | INTRAMUSCULAR | Status: AC
  Administered 2017-04-28: 10 mg via INTRAVENOUS
  Filled 2017-04-27: qty 1

## 2017-04-27 MED ORDER — ENOXAPARIN SODIUM 80 MG/0.8ML ~~LOC~~ SOLN
70.0000 mg | SUBCUTANEOUS | Status: DC
  Filled 2017-04-27: qty 0.7

## 2017-04-27 MED ORDER — DEXAMETHASONE SODIUM PHOSPHATE 10 MG/ML IJ SOLN: 10.0000 mg | Freq: Once | INTRAMUSCULAR | Status: DC

## 2017-04-27 MED ORDER — PROPOFOL 10 MG/ML IV BOLUS
INTRAVENOUS | Status: AC
  Filled 2017-04-27: qty 40

## 2017-04-27 MED ORDER — OXYCODONE HCL 5 MG PO TABS
10.0000 mg | ORAL_TABLET | ORAL | Status: DC | PRN
  Administered 2017-04-28 – 2017-04-30 (×16): 10 mg via ORAL
  Filled 2017-04-27 (×16): qty 2

## 2017-04-27 MED ORDER — BUPIVACAINE-EPINEPHRINE (PF) 0.25% -1:200000 IJ SOLN
INTRAMUSCULAR | Status: AC
  Filled 2017-04-27: qty 30

## 2017-04-27 MED ORDER — FENTANYL CITRATE (PF) 100 MCG/2ML IJ SOLN
50.0000 ug | INTRAMUSCULAR | Status: DC
  Administered 2017-04-27 (×6): 25 ug via INTRAVENOUS
  Administered 2017-04-27: 50 ug via INTRAVENOUS
  Administered 2017-04-27: 25 ug via INTRAVENOUS
  Administered 2017-04-27: 50 ug via INTRAVENOUS
  Administered 2017-04-27 (×5): 25 ug via INTRAVENOUS

## 2017-04-27 MED ORDER — GABAPENTIN 300 MG PO CAPS
300.0000 mg | ORAL_CAPSULE | Freq: Three times a day (TID) | ORAL | Status: DC
  Administered 2017-04-27 – 2017-04-30 (×9): 300 mg via ORAL
  Filled 2017-04-27 (×8): qty 1

## 2017-04-27 MED ORDER — VANCOMYCIN HCL IN DEXTROSE 1-5 GM/200ML-% IV SOLN
INTRAVENOUS | Status: AC
  Administered 2017-04-27: 1000 mg via INTRAVENOUS
  Filled 2017-04-27: qty 200

## 2017-04-27 MED ORDER — LACTATED RINGERS IV SOLN
INTRAVENOUS | Status: DC
  Administered 2017-04-27 (×3): via INTRAVENOUS

## 2017-04-27 MED ORDER — CELECOXIB 200 MG PO CAPS
200.0000 mg | ORAL_CAPSULE | Freq: Two times a day (BID) | ORAL | Status: DC
  Filled 2017-04-27 (×5): qty 1

## 2017-04-27 MED ORDER — FERROUS SULFATE 325 (65 FE) MG PO TABS: 325.0000 mg | ORAL_TABLET | Freq: Three times a day (TID) | ORAL | 3 refills | Status: DC

## 2017-04-27 MED ORDER — METOCLOPRAMIDE HCL 5 MG/ML IJ SOLN: 5.0000 mg | Freq: Three times a day (TID) | INTRAMUSCULAR | Status: DC | PRN

## 2017-04-27 MED ORDER — PHENYLEPHRINE 40 MCG/ML (10ML) SYRINGE FOR IV PUSH (FOR BLOOD PRESSURE SUPPORT)
PREFILLED_SYRINGE | INTRAVENOUS | Status: DC | PRN
  Administered 2017-04-27 (×6): 80 ug via INTRAVENOUS
  Administered 2017-04-27: 40 ug via INTRAVENOUS

## 2017-04-27 MED ORDER — ACETAMINOPHEN 650 MG RE SUPP: 650.0000 mg | RECTAL | Status: DC | PRN

## 2017-04-27 MED ORDER — ENOXAPARIN SODIUM 80 MG/0.8ML ~~LOC~~ SOLN: 70.0000 mg | SUBCUTANEOUS | Status: DC

## 2017-04-27 MED ORDER — DIPHENHYDRAMINE HCL 12.5 MG/5ML PO ELIX: 12.5000 mg | ORAL_SOLUTION | ORAL | Status: DC | PRN

## 2017-04-27 MED ORDER — KETOROLAC TROMETHAMINE 30 MG/ML IJ SOLN
INTRAMUSCULAR | Status: AC
  Filled 2017-04-27: qty 1

## 2017-04-27 MED ORDER — POLYETHYLENE GLYCOL 3350 17 G PO PACK
17.0000 g | PACK | Freq: Two times a day (BID) | ORAL | Status: DC
  Administered 2017-04-27 – 2017-04-30 (×6): 17 g via ORAL
  Filled 2017-04-27 (×6): qty 1

## 2017-04-27 MED ORDER — METHOCARBAMOL 500 MG PO TABS: 500.0000 mg | ORAL_TABLET | Freq: Four times a day (QID) | ORAL | 0 refills | Status: DC | PRN

## 2017-04-27 MED ORDER — POLYETHYLENE GLYCOL 3350 17 G PO PACK: 17.0000 g | PACK | Freq: Two times a day (BID) | ORAL | 0 refills | Status: DC

## 2017-04-27 MED ORDER — FERROUS SULFATE 325 (65 FE) MG PO TABS
325.0000 mg | ORAL_TABLET | Freq: Three times a day (TID) | ORAL | Status: DC
  Administered 2017-04-28 – 2017-04-30 (×7): 325 mg via ORAL
  Filled 2017-04-27 (×8): qty 1

## 2017-04-27 MED ORDER — METOCLOPRAMIDE HCL 5 MG PO TABS: 5.0000 mg | ORAL_TABLET | Freq: Three times a day (TID) | ORAL | Status: DC | PRN

## 2017-04-27 MED ORDER — METHOCARBAMOL 1000 MG/10ML IJ SOLN
500.0000 mg | Freq: Four times a day (QID) | INTRAVENOUS | Status: DC | PRN
  Administered 2017-04-30: 500 mg via INTRAVENOUS
  Filled 2017-04-27: qty 5
  Filled 2017-04-27: qty 550

## 2017-04-27 MED ORDER — CHLORHEXIDINE GLUCONATE 4 % EX LIQD: 60.0000 mL | Freq: Once | CUTANEOUS | Status: DC

## 2017-04-27 SURGICAL SUPPLY — 87 items
ADAPTER BOLT FEMORAL +2/-2 (Knees) ×1 IMPLANT
ADPR FEM +2/-2 OFST BOLT (Knees) ×1 IMPLANT
ADPR FEM 5D STRL KN PFC SGM (Orthopedic Implant) ×1 IMPLANT
AUG FEM SZ3 4 CMB POST STRL LF (Knees) ×1 IMPLANT
AUG FEM SZ3 4 STRL LF KN RT TI (Knees) ×1 IMPLANT
AUG FEM SZ3 8 STRL LF KN RT TI (Knees) ×1 IMPLANT
AUG TIB SZ2.5 5 REV STP WDG (Knees) ×2 IMPLANT
AUGMENT DIST PFC 8MM SZ2 RT (Knees) IMPLANT
BAG SPEC THK2 15X12 ZIP CLS (MISCELLANEOUS) ×1
BAG ZIPLOCK 12X15 (MISCELLANEOUS) ×2 IMPLANT
BANDAGE ACE 4X5 VEL STRL LF (GAUZE/BANDAGES/DRESSINGS) ×1 IMPLANT
BANDAGE ACE 6X5 VEL STRL LF (GAUZE/BANDAGES/DRESSINGS) ×2 IMPLANT
BANDAGE ESMARK 6X9 LF (GAUZE/BANDAGES/DRESSINGS) ×1 IMPLANT
BLADE SAW SGTL 11.0X1.19X90.0M (BLADE) IMPLANT
BLADE SAW SGTL 13.0X1.19X90.0M (BLADE) ×1 IMPLANT
BLADE SAW SGTL 81X20 HD (BLADE) ×2 IMPLANT
BNDG CMPR 9X6 STRL LF SNTH (GAUZE/BANDAGES/DRESSINGS) ×1
BNDG ESMARK 6X9 LF (GAUZE/BANDAGES/DRESSINGS) ×2
BONE CEMENT GENTAMICIN (Cement) ×6 IMPLANT
BRUSH FEMORAL CANAL (MISCELLANEOUS) ×1 IMPLANT
CEMENT BONE GENTAMICIN 40 (Cement) ×3 IMPLANT
COMP FEM CEM RT SZ3 (Orthopedic Implant) ×2 IMPLANT
COMPONENT FEM CEM RT SZ3 (Orthopedic Implant) IMPLANT
COVER SURGICAL LIGHT HANDLE (MISCELLANEOUS) ×2 IMPLANT
CUFF TOURN SGL QUICK 34 (TOURNIQUET CUFF) ×2
CUFF TRNQT CYL 34X4X40X1 (TOURNIQUET CUFF) ×1 IMPLANT
DISAL AUG PFC 8MM SZ2 RT (Knees) ×2 IMPLANT
DISTAL WEDGE PFC 4MM RIGHT (Knees) ×2 IMPLANT
DRAPE EXTREMITY T 121X128X90 (DRAPE) ×2 IMPLANT
DRAPE POUCH INSTRU U-SHP 10X18 (DRAPES) ×2 IMPLANT
DRAPE U-SHAPE 47X51 STRL (DRAPES) ×2 IMPLANT
DRESSING AQUACEL AG SP 3.5X10 (GAUZE/BANDAGES/DRESSINGS) IMPLANT
DRSG AQUACEL AG SP 3.5X10 (GAUZE/BANDAGES/DRESSINGS) ×2
DRSG PAD ABDOMINAL 8X10 ST (GAUZE/BANDAGES/DRESSINGS) ×2 IMPLANT
DRSG VAC ATS LRG SENSATRAC (GAUZE/BANDAGES/DRESSINGS) ×1 IMPLANT
DURAPREP 26ML APPLICATOR (WOUND CARE) ×2 IMPLANT
ELECT REM PT RETURN 15FT ADLT (MISCELLANEOUS) ×2 IMPLANT
FACESHIELD WRAPAROUND (MASK) ×10 IMPLANT
FACESHIELD WRAPAROUND OR TEAM (MASK) ×5 IMPLANT
FEMORAL ADAPTER (Orthopedic Implant) ×1 IMPLANT
GAUZE SPONGE 4X4 12PLY STRL (GAUZE/BANDAGES/DRESSINGS) ×4 IMPLANT
GAUZE XEROFORM 5X9 LF (GAUZE/BANDAGES/DRESSINGS) ×2 IMPLANT
GLOVE BIOGEL PI IND STRL 7.0 (GLOVE) IMPLANT
GLOVE BIOGEL PI IND STRL 7.5 (GLOVE) ×1 IMPLANT
GLOVE BIOGEL PI IND STRL 8.5 (GLOVE) ×1 IMPLANT
GLOVE BIOGEL PI INDICATOR 7.0 (GLOVE) ×4
GLOVE BIOGEL PI INDICATOR 7.5 (GLOVE) ×5
GLOVE BIOGEL PI INDICATOR 8.5 (GLOVE) ×1
GLOVE ECLIPSE 8.0 STRL XLNG CF (GLOVE) ×3 IMPLANT
GLOVE ORTHO TXT STRL SZ7.5 (GLOVE) ×4 IMPLANT
GLOVE SURG SS PI 7.5 STRL IVOR (GLOVE) ×1 IMPLANT
GOWN STRL REUS W/TWL LRG LVL3 (GOWN DISPOSABLE) ×3 IMPLANT
GOWN STRL REUS W/TWL XL LVL3 (GOWN DISPOSABLE) ×3 IMPLANT
HANDPIECE INTERPULSE COAX TIP (DISPOSABLE) ×2
IMMOBILIZER KNEE 20 (SOFTGOODS)
IMMOBILIZER KNEE 20 THIGH 36 (SOFTGOODS) IMPLANT
INSERT PFC SIG STB SZ3 15.0MM (Knees) ×1 IMPLANT
MANIFOLD NEPTUNE II (INSTRUMENTS) ×2 IMPLANT
NDL SAFETY ECLIPSE 18X1.5 (NEEDLE) ×1 IMPLANT
NEEDLE HYPO 18GX1.5 SHARP (NEEDLE) ×2
PADDING CAST COTTON 6X4 STRL (CAST SUPPLIES) ×4 IMPLANT
PATELLA DOME PFC 38MM (Knees) ×2 IMPLANT
POSITIONER SURGICAL ARM (MISCELLANEOUS) ×2 IMPLANT
POST AVE PFC 4MM (Knees) ×1 IMPLANT
PREVENA INCISION MGT 90 150 (MISCELLANEOUS) ×1 IMPLANT
RESTRICTOR CEMENT SZ 5 C-STEM (Cement) ×2 IMPLANT
SET HNDPC FAN SPRY TIP SCT (DISPOSABLE) ×1 IMPLANT
SET PAD KNEE POSITIONER (MISCELLANEOUS) ×2 IMPLANT
SPONGE LAP 18X18 X RAY DECT (DISPOSABLE) ×2 IMPLANT
STAPLER VISISTAT 35W (STAPLE) ×1 IMPLANT
STEM TIBIA PFC 13X30MM (Stem) ×1 IMPLANT
STEM TIBIA PFC 13X60MM (Stem) ×1 IMPLANT
SUCTION FRAZIER HANDLE 12FR (TUBING) ×1
SUCTION TUBE FRAZIER 12FR DISP (TUBING) ×1 IMPLANT
SUT VIC AB 1 CT1 36 (SUTURE) ×6 IMPLANT
SUT VIC AB 2-0 CT1 27 (SUTURE) ×6
SUT VIC AB 2-0 CT1 TAPERPNT 27 (SUTURE) ×3 IMPLANT
SYR 50ML LL SCALE MARK (SYRINGE) ×2 IMPLANT
TOWEL OR 17X26 10 PK STRL BLUE (TOWEL DISPOSABLE) ×2 IMPLANT
TOWER CARTRIDGE SMART MIX (DISPOSABLE) ×2 IMPLANT
TRAY FOLEY CATH SILVER 14FR (SET/KITS/TRAYS/PACK) ×1 IMPLANT
TRAY REVISION SZ 3 (Knees) ×2 IMPLANT
TRAY SLEEVE CEM ML (Knees) ×2 IMPLANT
WEDGE DISTAL PFC RIGHT 4MM (Knees) IMPLANT
WEDGE STEP SZ.5 5MM (Knees) ×2 IMPLANT
WRAP KNEE MAXI GEL POST OP (GAUZE/BANDAGES/DRESSINGS) ×2 IMPLANT
YANKAUER SUCT BULB TIP 10FT TU (MISCELLANEOUS) ×1 IMPLANT

## 2017-04-27 NOTE — Anesthesia Procedure Notes (Signed)
Procedure Name: LMA Insertion Date/Time: 04/27/2017 12:39 PM Performed by: Claudia Desanctis, CRNA Pre-anesthesia Checklist: Emergency Drugs available, Patient identified, Suction available and Patient being monitored Patient Re-evaluated:Patient Re-evaluated prior to induction Oxygen Delivery Method: Circle system utilized Preoxygenation: Pre-oxygenation with 100% oxygen Induction Type: IV induction Ventilation: Mask ventilation without difficulty LMA: LMA with gastric port inserted LMA Size: 4.0 Number of attempts: 1 Placement Confirmation: positive ETCO2 and breath sounds checked- equal and bilateral Tube secured with: Tape Dental Injury: Teeth and Oropharynx as per pre-operative assessment

## 2017-04-27 NOTE — Transfer of Care (Signed)
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)  Patient Location: PACU  Anesthesia Type:General  Level of Consciousness: awake, alert , oriented and patient cooperative  Airway & Oxygen Therapy: Patient Spontanous Breathing and Patient connected to face mask  Post-op Assessment: Report given to RN and Post -op Vital signs reviewed and stable  Post vital signs: Reviewed and stable  Last Vitals:  Vitals:   04/27/17 1200 04/27/17 1215  BP: (!) 125/58 (!) 114/56  Pulse: 100 97  Resp: 18 16  SpO2: 98% 94%    Last Pain:  Vitals:   04/27/17 0908  TempSrc: Oral      Patients Stated Pain Goal: 3 (88/32/54 9826)  Complications: No apparent anesthesia complications

## 2017-04-27 NOTE — Anesthesia Postprocedure Evaluation (Signed)
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)     Patient location during evaluation: PACU Anesthesia Type: General Level of consciousness: awake and sedated Pain management: pain level controlled Vital Signs Assessment: post-procedure vital signs reviewed and stable Respiratory status: spontaneous breathing Cardiovascular status: stable Postop Assessment: no apparent nausea or vomiting Anesthetic complications: no    Last Vitals:  Vitals:   04/27/17 1600 04/27/17 1615  BP: 125/66 123/70  Pulse: 87 86  Resp: 10 13  Temp:    SpO2: 100% 100%    Last Pain:  Vitals:   04/27/17 1615  TempSrc:   PainSc: 2    Pain Goal: Patients Stated Pain Goal: 3 (04/27/17 0918)               Clio Gerhart JR,JOHN Mateo Flow

## 2017-04-27 NOTE — Progress Notes (Signed)
AssistedDr. Jillyn Hidden with  block. Side rails up, monitors on throughout procedure. See vital signright, ultrasound guided, adductor canals in flow sheet. Tolerated Procedure well.

## 2017-04-27 NOTE — Discharge Instructions (Addendum)
INSTRUCTIONS AFTER JOINT REPLACEMENT   o Remove items at home which could result in a fall. This includes throw rugs or furniture in walking pathways o ICE to the affected joint every three hours while awake for 30 minutes at a time, for at least the first 3-5 days, and then as needed for pain and swelling.  Continue to use ice for pain and swelling. You may notice swelling that will progress down to the foot and ankle.  This is normal after surgery.  Elevate your leg when you are not up walking on it.   o Continue to use the breathing machine you got in the hospital (incentive spirometer) which will help keep your temperature down.  It is common for your temperature to cycle up and down following surgery, especially at night when you are not up moving around and exerting yourself.  The breathing machine keeps your lungs expanded and your temperature down.   DIET:  As you were doing prior to hospitalization, we recommend a well-balanced diet.  DRESSING / WOUND CARE / SHOWERING  Change and charge as instructed.  ACTIVITY  o Increase activity slowly as tolerated, but follow the weight bearing instructions below.   o No driving for 6 weeks or until further direction given by your physician.  You cannot drive while taking narcotics.  o No lifting or carrying greater than 10 lbs. until further directed by your surgeon. o Avoid periods of inactivity such as sitting longer than an hour when not asleep. This helps prevent blood clots.  o You may return to work once you are authorized by your doctor.     WEIGHT BEARING   Weight bearing as tolerated with assist device (walker, cane, etc) as directed, use it as long as suggested by your surgeon or therapist, typically at least 4-6 weeks.   EXERCISES  Results after joint replacement surgery are often greatly improved when you follow the exercise, range of motion and muscle strengthening exercises prescribed by your doctor. Safety measures are also  important to protect the joint from further injury. Any time any of these exercises cause you to have increased pain or swelling, decrease what you are doing until you are comfortable again and then slowly increase them. If you have problems or questions, call your caregiver or physical therapist for advice.   Rehabilitation is important following a joint replacement. After just a few days of immobilization, the muscles of the leg can become weakened and shrink (atrophy).  These exercises are designed to build up the tone and strength of the thigh and leg muscles and to improve motion. Often times heat used for twenty to thirty minutes before working out will loosen up your tissues and help with improving the range of motion but do not use heat for the first two weeks following surgery (sometimes heat can increase post-operative swelling).   These exercises can be done on a training (exercise) mat, on the floor, on a table or on a bed. Use whatever works the best and is most comfortable for you.    Use music or television while you are exercising so that the exercises are a pleasant break in your day. This will make your life better with the exercises acting as a break in your routine that you can look forward to.   Perform all exercises about fifteen times, three times per day or as directed.  You should exercise both the operative leg and the other leg as well.  Exercises include:  Quad Sets - Tighten up the muscle on the front of the thigh (Quad) and hold for 5-10 seconds.   °• Straight Leg Raises - With your knee straight (if you were given a brace, keep it on), lift the leg to 60 degrees, hold for 3 seconds, and slowly lower the leg.  Perform this exercise against resistance later as your leg gets stronger.  °• Leg Slides: Lying on your back, slowly slide your foot toward your buttocks, bending your knee up off the floor (only go as far as is comfortable). Then slowly slide your foot back down until  your leg is flat on the floor again.  °• Angel Wings: Lying on your back spread your legs to the side as far apart as you can without causing discomfort.  °• Hamstring Strength:  Lying on your back, push your heel against the floor with your leg straight by tightening up the muscles of your buttocks.  Repeat, but this time bend your knee to a comfortable angle, and push your heel against the floor.  You may put a pillow under the heel to make it more comfortable if necessary.  ° °A rehabilitation program following joint replacement surgery can speed recovery and prevent re-injury in the future due to weakened muscles. Contact your doctor or a physical therapist for more information on knee rehabilitation.  ° ° °CONSTIPATION ° °Constipation is defined medically as fewer than three stools per week and severe constipation as less than one stool per week.  Even if you have a regular bowel pattern at home, your normal regimen is likely to be disrupted due to multiple reasons following surgery.  Combination of anesthesia, postoperative narcotics, change in appetite and fluid intake all can affect your bowels.  ° °YOU MUST use at least one of the following options; they are listed in order of increasing strength to get the job done.  They are all available over the counter, and you may need to use some, POSSIBLY even all of these options:   ° °Drink plenty of fluids (prune juice may be helpful) and high fiber foods °Colace 100 mg by mouth twice a day  °Senokot for constipation as directed and as needed Dulcolax (bisacodyl), take with full glass of water  °Miralax (polyethylene glycol) once or twice a day as needed. ° °If you have tried all these things and are unable to have a bowel movement in the first 3-4 days after surgery call either your surgeon or your primary doctor.   ° °If you experience loose stools or diarrhea, hold the medications until you stool forms back up.  If your symptoms do not get better within 1 week  or if they get worse, check with your doctor.  If you experience "the worst abdominal pain ever" or develop nausea or vomiting, please contact the office immediately for further recommendations for treatment. ° ° °ITCHING:  If you experience itching with your medications, try taking only a single pain pill, or even half a pain pill at a time.  You can also use Benadryl over the counter for itching or also to help with sleep.  ° °TED HOSE STOCKINGS:  Use stockings on both legs until for at least 2 weeks or as directed by physician office. They may be removed at night for sleeping. ° °MEDICATIONS:  See your medication summary on the “After Visit Summary” that nursing will review with you.  You may have some home medications which will be placed on hold until   you complete the course of blood thinner medication.  It is important for you to complete the blood thinner medication as prescribed.  PRECAUTIONS:  If you experience chest pain or shortness of breath - call 911 immediately for transfer to the hospital emergency department.   If you develop a fever greater that 101 F, purulent drainage from wound, increased redness or drainage from wound, foul odor from the wound/dressing, or calf pain - CONTACT YOUR SURGEON.                                                   FOLLOW-UP APPOINTMENTS:  If you do not already have a post-op appointment, please call the office for an appointment to be seen by your surgeon.  Guidelines for how soon to be seen are listed in your After Visit Summary, but are typically between 1-4 weeks after surgery.  OTHER INSTRUCTIONS:   Knee Replacement:  Do not place pillow under knee, focus on keeping the knee straight while resting.   MAKE SURE YOU:   Understand these instructions.   Get help right away if you are not doing well or get worse.    Thank you for letting us be a part of your medical care team.  It is a privilege we respect greatly.  We hope these instructions will  help you stay on track for a fast and full recovery!

## 2017-04-27 NOTE — Anesthesia Procedure Notes (Addendum)
Anesthesia Regional Block: Adductor canal block   Pre-Anesthetic Checklist: ,, timeout performed, Correct Patient, Correct Site, Correct Laterality, Correct Procedure, Correct Position, site marked, Risks and benefits discussed,  Surgical consent,  Pre-op evaluation,  At surgeon's request and post-op pain management  Laterality: Right and Lower  Prep: chloraprep       Needles:  Injection technique: Single-shot  Needle Type: Echogenic Stimulator Needle     Needle Length: 9cm  Needle Gauge: 21   Needle insertion depth: 4 cm   Additional Needles:   Procedures:,,,, ultrasound used (permanent image in chart),,,,  Narrative:  Start time: 04/27/2017 11:20 AM End time: 04/27/2017 11:30 AM Injection made incrementally with aspirations every 5 mL.  Performed by: Personally  Anesthesiologist: Lyn Hollingshead, MD

## 2017-04-27 NOTE — Anesthesia Preprocedure Evaluation (Signed)
Anesthesia Evaluation  Patient identified by MRN, date of birth, ID band Patient awake    Reviewed: Allergy & Precautions, NPO status , Patient's Chart, lab work & pertinent test results  History of Anesthesia Complications (+) PONV and history of anesthetic complications  Airway Mallampati: II       Dental no notable dental hx. (+) Teeth Intact   Pulmonary neg pulmonary ROS,    Pulmonary exam normal breath sounds clear to auscultation       Cardiovascular Normal cardiovascular exam Rhythm:Regular Rate:Normal     Neuro/Psych    GI/Hepatic Neg liver ROS,   Endo/Other  negative endocrine ROS  Renal/GU negative Renal ROS     Musculoskeletal   Abdominal Normal abdominal exam  (+)   Peds  Hematology   Anesthesia Other Findings   Reproductive/Obstetrics                             Anesthesia Physical Anesthesia Plan  ASA: II  Anesthesia Plan: General   Post-op Pain Management:  Regional for Post-op pain   Induction:   PONV Risk Score and Plan: 4 or greater and Scopolamine patch - Pre-op, Dexamethasone and Ondansetron  Airway Management Planned: LMA and Oral ETT  Additional Equipment:   Intra-op Plan:   Post-operative Plan: Extubation in OR  Informed Consent: I have reviewed the patients History and Physical, chart, labs and discussed the procedure including the risks, benefits and alternatives for the proposed anesthesia with the patient or authorized representative who has indicated his/her understanding and acceptance.   Dental advisory given  Plan Discussed with: CRNA and Surgeon  Anesthesia Plan Comments:         Anesthesia Quick Evaluation

## 2017-04-27 NOTE — Interval H&P Note (Signed)
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 After consideration of risks, benefits and other options for treatment, the patient has consented to  Procedure(s) with comments: Reimplantation of right total knee arthroplasty versus repeat irrigation and debridement and placement of antibiotic spacer (Right) - 90 mins as a surgical intervention .  The patient's history has been reviewed, patient examined, no change in status, stable for surgery.  I have reviewed the patient's chart and labs.  Questions were answered to the patient's satisfaction.     Mauri Pole

## 2017-04-27 NOTE — Brief Op Note (Signed)
04/27/2017  6:53 PM  PATIENT:  [name]  61 y.o. 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  PROCEDURE:  Procedure(s) with comments: Reimplantation of right total knee arthroplasty (Right) - Adductor Block  SURGEON:  Surgeon(s) and Role:    Paralee Cancel, MD - Primary  PHYSICIAN ASSISTANT: Danae Orleans, PA-C  ANESTHESIA:   regional and general  EBL:  50 mL   BLOOD ADMINISTERED:none  DRAINS: none   LOCAL MEDICATIONS USED:  MARCAINE     SPECIMEN:  Source of Specimen:  right knee  DISPOSITION OF SPECIMEN:  PATHOLOGY  COUNTS:  YES  TOURNIQUET:   Total Tourniquet Time Documented: Thigh (Right) - 72 minutes Total: Thigh (Right) - 72 minutes   DICTATION: .Other Dictation: Dictation Number [id number]  PLAN OF CARE: Admit to inpatient   PATIENT DISPOSITION:  PACU - hemodynamically stable.   Delay start of Pharmacological VTE agent (>24hrs) due to surgical blood loss or risk of bleeding: no

## 2017-04-28 DIAGNOSIS — Z96651 Presence of right artificial knee joint: Secondary | ICD-10-CM

## 2017-04-28 DIAGNOSIS — T8453XD Infection and inflammatory reaction due to internal right knee prosthesis, subsequent encounter: Principal | ICD-10-CM

## 2017-04-28 DIAGNOSIS — Y838 Other surgical procedures as the cause of abnormal reaction of the patient, or of later complication, without mention of misadventure at the time of the procedure: Secondary | ICD-10-CM

## 2017-04-28 DIAGNOSIS — C50919 Malignant neoplasm of unspecified site of unspecified female breast: Secondary | ICD-10-CM

## 2017-04-28 LAB — BASIC METABOLIC PANEL
ANION GAP: 6 (ref 5–15)
BUN: 8 mg/dL (ref 6–20)
CALCIUM: 8.5 mg/dL — AB (ref 8.9–10.3)
CO2: 28 mmol/L (ref 22–32)
CREATININE: 0.51 mg/dL (ref 0.44–1.00)
Chloride: 101 mmol/L (ref 101–111)
GFR calc Af Amer: >60 mL/min (ref >60)
GLUCOSE: 106 mg/dL — AB (ref 65–99)
Potassium: 3.8 mmol/L (ref 3.5–5.1)
Sodium: 135 mmol/L (ref 135–145)

## 2017-04-28 LAB — CBC
HEMATOCRIT: 27.1 % — AB (ref 36.0–46.0)
Hemoglobin: 9.1 g/dL — ABNORMAL LOW (ref 12.0–15.0)
MCH: 31.9 pg (ref 26.0–34.0)
MCHC: 33.6 g/dL (ref 30.0–36.0)
MCV: 95.1 fL (ref 78.0–100.0)
PLATELETS: 162 10*3/uL (ref 150–400)
RBC: 2.85 MIL/uL — ABNORMAL LOW (ref 3.87–5.11)
RDW: 16.1 % — AB (ref 11.5–15.5)
WBC: 6.4 10*3/uL (ref 4.0–10.5)

## 2017-04-28 MED ORDER — VANCOMYCIN HCL 10 G IV SOLR
1500.0000 mg | Freq: Once | INTRAVENOUS | Status: AC
  Administered 2017-04-28: 1500 mg via INTRAVENOUS
  Filled 2017-04-28: qty 1500

## 2017-04-28 MED ORDER — VANCOMYCIN HCL IN DEXTROSE 750-5 MG/150ML-% IV SOLN
750.0000 mg | Freq: Two times a day (BID) | INTRAVENOUS | Status: DC
  Administered 2017-04-29 – 2017-04-30 (×4): 750 mg via INTRAVENOUS
  Filled 2017-04-28 (×4): qty 150

## 2017-04-28 NOTE — Progress Notes (Signed)
Physical Therapy Treatment Patient Details Name: [name] MRN: [id number] DOB: [birth year] Today's Date: 04/28/2017    History of Present Illness Pt is a 61 year old female s/p Reimplantation of right total knee arthroplasty 04/27/17 with hx of right knee I&D, antibiotic spacer    PT Comments    Pt performed LE exercises and assisted with ambulating in hallway.    Follow Up Recommendations  Outpatient PT;DC plan and follow up therapy as arranged by surgeon     Equipment Recommendations  None recommended by PT    Recommendations for Other Services       Precautions / Restrictions Precautions Precautions: Fall;Knee Precaution Comments: R knee NPWT Restrictions Weight Bearing Restrictions: No Other Position/Activity Restrictions: WBAT    Mobility  Bed Mobility Overal bed mobility: Modified Independent             General bed mobility comments: pt up with PT upon OT arrival  Transfers Overall transfer level: Needs assistance Equipment used: Rolling walker (2 wheeled) Transfers: Sit to/from Stand Sit to Stand: Supervision         General transfer comment: supervision for safety due to lines  Ambulation/Gait Ambulation/Gait assistance: Min guard Ambulation Distance (Feet): 120 Feet Assistive device: Rolling walker (2 wheeled) Gait Pattern/deviations: Step-to pattern;Decreased stance time - right;Antalgic     General Gait Details: distance to tolerance   Stairs            Wheelchair Mobility    Modified Rankin (Stroke Patients Only)       Balance Overall balance assessment: Needs assistance Sitting-balance support: No upper extremity supported;Feet supported;Feet unsupported Sitting balance-Leahy Scale: Good     Standing balance support: Single extremity supported;Bilateral upper extremity supported;During functional activity Standing balance-Leahy Scale: Fair                              Cognition Arousal/Alertness:  Awake/alert Behavior During Therapy: WFL for tasks assessed/performed Overall Cognitive Status: Within Functional Limits for tasks assessed                                        Exercises Total Joint Exercises Ankle Circles/Pumps: AROM;Both;10 reps Quad Sets: AROM;Both;10 reps Short Arc Quad: AROM;Right;10 reps Heel Slides: AAROM;Right;10 reps Hip ABduction/ADduction: AROM;Right;10 reps Straight Leg Raises: AROM;Right;10 reps Goniometric ROM: approx 50* AAROM R knee flexion supine    General Comments        Pertinent Vitals/Pain Pain Assessment: 0-10 Pain Score: 3  Pain Location: R knee Pain Descriptors / Indicators: Sore;Aching Pain Intervention(s): Limited activity within patient's tolerance;Repositioned;Monitored during session    Home Living Family/patient expects to be discharged to:: Private residence Living Arrangements: Spouse/significant other Available Help at Discharge: Family Type of Home: House Home Access: Stairs to enter Entrance Stairs-Rails: Right Home Layout: Two level;Able to live on main level with bedroom/bathroom Home Equipment: Gilford Rile - 2 wheels;Walker - 4 wheels Additional Comments: 1/2 bath and single bed on main level    Prior Function Level of Independence: Independent with assistive device(s)  Gait / Transfers Assistance Needed: recently started using walker with 4 wheels, able to perform steps one at a time   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    PT Goals (current goals can now be found in the care plan section) Acute Rehab PT Goals Patient Stated Goal:  go home PT Goal Formulation: With patient Time For Goal Achievement: 05/02/17 Potential to Achieve Goals: Good Progress towards PT goals: Progressing toward goals    Frequency    7X/week      PT Plan Current plan remains appropriate    Co-evaluation              AM-PAC PT "6 Clicks" Daily Activity  Outcome Measure  Difficulty turning  over in bed (including adjusting bedclothes, sheets and blankets)?: A Little Difficulty moving from lying on back to sitting on the side of the bed? : A Little Difficulty sitting down on and standing up from a chair with arms (e.g., wheelchair, bedside commode, etc,.)?: A Little Help needed moving to and from a bed to chair (including a wheelchair)?: A Little Help needed walking in hospital room?: A Little Help needed climbing 3-5 steps with a railing? : A Little 6 Click Score: 18    End of Session   Activity Tolerance: Patient tolerated treatment well Patient left: in bed;with call bell/phone within reach   PT Visit Diagnosis: Other abnormalities of gait and mobility (R26.89)     Time: 4462-8638 PT Time Calculation (min) (ACUTE ONLY): 24 min  Charges:  $Gait Training: 8-22 mins $Therapeutic Exercise: 8-22 mins                    G Codes:       [name], PT, DPT 04/28/2017 Pager: [phone]  [name] 04/28/2017, 3:36 PM

## 2017-04-28 NOTE — Op Note (Signed)
NAME:  [name], MAIDEN                       ACCOUNT NO.:  MEDICAL RECORD NO.:  [id number]  LOCATION:                                 FACILITY:  PHYSICIAN:  Pietro Cassis. Alvan Dame, M.D.  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  PROCEDURE:  Revision/reimplantation of right total knee replacement utilizing components from [employer] knee system with a size 3 TC3 femur, 4 mm distal medial augment, 8 mm distal lateral augment, 4 mm posterior medial augment and +2 bolt, 5-degree adapter with a 13 x 60 mm cemented stem.  The tibial tray with a size 3 MBT revision tray with 5 mm medial and lateral augments, a size 29 cemented sleeve and a 13 x 30 cemented stem.  We used the size 15 posterior stabilized insert to match the size 3 femur and a 38 patellar button.  SURGEON:  Pietro Cassis. Alvan Dame, M.D.  ASSISTANT:  Danae Orleans, PA-C.  Note that [name] was present for the entirety of the case from preoperative position, perioperative management of the operative extremity, general facilitation of the case and primary wound closure.  ANESTHESIA:  Regional plus general anesthetic.  SPECIMENS:  Fluid from the knee joint was swabbed and sent for Gram stain culture to Pathology, stat.  DRAINS:  None.  BLOOD LOSS:  Less than 100 mL.  TOURNIQUET:  Up for 72 minutes at 250 mmHg.  INDICATION FOR PROCEDURE:  [name] is a 61 year old female with history of right total knee replacement performed by one of my partners.   This was complicated by wound-related issues and infection.  She was subsequently referred for evaluation and management.  At the time of my involvement, I had resected her knee and tried to address her skin as best as possible; however, she had persistent wound problems that led to evaluation and management with Dr. Migdalia Dk Dillingham.  Once she had gotten skin graft adequately healed and was cleared with scheduling, we discussed reimplantation.  Her lab markers were up a little bit. However, we felt it was best at this point to proceed.  I reviewed the risk of recurrent infection, concerns within skin and the potential for the need for a further skin management and other operations.  Consent was obtained.  PROCEDURE IN DETAIL:  The patient was brought to the operative theater. Once adequate anesthesia, preoperative antibiotics, vancomycin administered, she was positioned supine with a right thigh tourniquet placed.  De[employer] leg holder was utilized.  The right lower extremity was prepped and draped in sterile fashion.  A time-out was performed identifying the patient, planned procedure and extremity.  I did decide to make a separate-type incision extending from the proximal incision, which I excised the portion of it, then I deviated  it medially to avoid the area where she had the skin graft.  Soft tissue planes created; however, I elected based on my conversation with Dr. Migdalia Dk to not elevate the skin graft.  I was able to mobilize the soft tissues enough to not have to do this.  Once the soft tissues were exposed, I made a median arthrotomy.  She had a bloody seromatous effusion, culture swabs were taken.  At this point, exposure of the knee was carried out by synovectomy and scar debridement, exposing the knee and allowing for the knee to be flexed with the patella subluxated laterally.  The cemented articulating spacer was then removed using osteotomes.  Following debridement  of the cement, I reamed the femoral and tibial canals up to 15 mm reamer for placement of a cemented stem 13 mm in diameter.  Attention was now directed to the tibia using extramedullary guide and allowing for 2 degrees of posterior slope.  I refreshed the tibia cut removing minimal bone.  Following this, I was able to further debride medial, lateral and posterior aspect of the tibia surface.  I sized the tibia to be best fit with size 3.  The size 3 tibial tray was placed and it was held in position and confirmed for alignment.  It was then drilled for an MBT revision component and then broached with a 29 cemented broach.  With this in place, I now attended to the femur.  The tibial tray was used to set rotation of the femoral component.  An intramedullary rod was placed in the femur and centralized.  I made minimal refreshing cut off the distal femur and indicated that need to have an augment on the distal lateral side.  At this point, I set the rotation based off the femur and selected a size 3 cutting block.  Anterior, posterior and chamber cuts were revisited.  At this point, I determined that I needed a posterior medial augment.  At this point, I did a trial reduction and determined that I needed further augmentation on the femur and the tibia.  At this point, the final components were opened and configured on the back table under my direct supervision.  While this was carried out, we irrigated the knee with 4 L of normal saline solution including using the canal brush irrigator on the femoral and tibial sides as well as just regular pulse lavage on the knee.  The synovial capsule junction of the knee was injected with 0.25% Marcaine with epinephrine, 1 mL of Toradol and saline.  Once this was ready, the surfaces were dried, cement restrictor was placed in the femur and the tibia, and the final components were then cemented into place.  Based on trial reductions, I had  selected a 15 mm insert.  The knee was brought to full extension and allowed the cement to fully cure.  Once the cement had cured, excessive cement was removed throughout the knee and the final 15 mm insert was selected. The final insert was then placed and the knee was reduced.  Additionally, I had revised the patella, refreshing the cut down to 14 mm and then re-preparing the patella for a 38 patellar button.  We drilled the lug holes for this.  The patella was cemented in place as well.  The knee was re-irrigated with normal saline solution at the end of the case.  We reapproximated the extensor mechanism using #1 Vicryl suture and 0 Stratafix suture.  The remainder of the wound  was closed with 2-0 Vicryl and staples on the skin.  I then dressed the wound with a Prevena dressing per the recommendation of Dr. Migdalia Dk.  The knee was then wrapped with an Ace wrap loosely and the wound dressing attached to suction.  She was then woken from anesthesia and brought to the recovery room in stable condition tolerating the procedure well.  Findings were reviewed with her husband.  In the postop period, I will consult Infectious Disease after she received another dose of vancomycin to come up with an oral antibiotic that she can tolerate with hopes for her to remain on it for 3-6 months. We will initiate physical therapy otherwise.     Pietro Cassis Alvan Dame, M.D.     MDO/MEDQ  D:  04/27/2017  T:  04/27/2017  Job:  [id number]

## 2017-04-28 NOTE — Evaluation (Signed)
Physical Therapy Evaluation Patient Details Name: [name] MRN: [id number] DOB: [birth year] Today's Date: 04/28/2017   History of Present Illness  Pt is a 61 year old female s/p Reimplantation of right total knee arthroplasty 04/27/17 with hx of right knee I&D, antibiotic spacer  Clinical Impression  Patient is s/p above surgery resulting in functional limitations due to the deficits listed below (see PT Problem List).  Patient will benefit from skilled PT to increase their independence and safety with mobility to allow discharge to the venue listed below.  Pt assisted with ambulating and reports she plans to d/c home hopefully tomorrow.  Pt has been using walker since prior to surgery and feels confident with stairs/steps performing one at a time.     Follow Up Recommendations Outpatient PT;DC plan and follow up therapy as arranged by surgeon    Equipment Recommendations  None recommended by PT    Recommendations for Other Services       Precautions / Restrictions Precautions Precautions: Fall;Knee Precaution Comments: NPWT R knee Restrictions Weight Bearing Restrictions: No Other Position/Activity Restrictions: WBAT      Mobility  Bed Mobility               General bed mobility comments: pt up in recliner on arrival  Transfers Overall transfer level: Needs assistance Equipment used: Rolling walker (2 wheeled) Transfers: Sit to/from Stand Sit to Stand: Min assist         General transfer comment: able to perform with correct technique, min assist to rise  Ambulation/Gait Ambulation/Gait assistance: Min guard Ambulation Distance (Feet): 120 Feet Assistive device: Rolling walker (2 wheeled) Gait Pattern/deviations: Step-to pattern;Decreased stance time - right;Antalgic     General Gait Details: pt feels confident with RW, encouraged WBAT, distance to tolerance  Stairs            Wheelchair Mobility    Modified Rankin (Stroke Patients Only)        Balance                                             Pertinent Vitals/Pain Pain Assessment: 0-10 Pain Score: 4  Pain Location: right knee Pain Descriptors / Indicators: Sore Pain Intervention(s): Limited activity within patient's tolerance;Repositioned;Monitored during session    Home Living Family/patient expects to be discharged to:: Private residence Living Arrangements: Spouse/significant other Available Help at Discharge: Family Type of Home: House Home Access: Stairs to enter Entrance Stairs-Rails: Right Entrance Stairs-Number of Steps: 5 Home Layout: Two level;Able to live on main level with bedroom/bathroom Home Equipment: Gilford Rile - 2 wheels;Walker - 4 wheels      Prior Function Level of Independence: Independent with assistive device(s)   Gait / Transfers Assistance Needed: recently started using walker with 4 wheels, able to perform steps one at a time           Hand Dominance        Extremity/Trunk Assessment        Lower Extremity Assessment Lower Extremity Assessment: RLE deficits/detail RLE Deficits / Details: assist for R LE required, ROM TBA       Communication   Communication: No difficulties  Cognition Arousal/Alertness: Awake/alert Behavior During Therapy: WFL for tasks assessed/performed Overall Cognitive Status: Within Functional Limits for tasks assessed  General Comments      Exercises     Assessment/Plan    PT Assessment Patient needs continued PT services  PT Problem List Decreased strength;Decreased range of motion;Decreased mobility;Pain       PT Treatment Interventions Stair training;DME instruction;Gait training;Therapeutic exercise;Therapeutic activities;Patient/family education;Functional mobility training    PT Goals (Current goals can be found in the Care Plan section)  Acute Rehab PT Goals PT Goal Formulation: With patient Time For  Goal Achievement: 05/02/17 Potential to Achieve Goals: Good    Frequency 7X/week   Barriers to discharge        Co-evaluation               AM-PAC PT "6 Clicks" Daily Activity  Outcome Measure Difficulty turning over in bed (including adjusting bedclothes, sheets and blankets)?: A Little Difficulty moving from lying on back to sitting on the side of the bed? : A Little Difficulty sitting down on and standing up from a chair with arms (e.g., wheelchair, bedside commode, etc,.)?: A Little Help needed moving to and from a bed to chair (including a wheelchair)?: A Little Help needed walking in hospital room?: A Little Help needed climbing 3-5 steps with a railing? : A Little 6 Click Score: 18    End of Session   Activity Tolerance: Patient tolerated treatment well Patient left: Other (comment)(handoff to OT with standing - heading to bathroom)   PT Visit Diagnosis: Other abnormalities of gait and mobility (R26.89)    Time: 1049-1101 PT Time Calculation (min) (ACUTE ONLY): 12 min   Charges:   PT Evaluation $PT Eval Low Complexity: 1 Low     PT G Codes:       [name], PT, DPT 04/28/2017 Pager: [phone]   [name] 04/28/2017, 12:31 PM

## 2017-04-28 NOTE — Evaluation (Signed)
Occupational Therapy Evaluation Patient Details Name: [name] MRN: [id number] DOB: [birth year] Today's Date: 04/28/2017    History of Present Illness Pt is a 61 year old female s/p Reimplantation of right total knee arthroplasty 04/27/17 with hx of right knee I&D, antibiotic spacer   Clinical Impression   Pt is at min A level with LB ADLs and min guard A with ADL mobility. Pt will have assist at home from family and has all DME and A/E necessary from previous knee surgery. All education completed and no further acute OT is indicated at this time    Follow Up Recommendations  DC plan and follow up therapy as arranged by surgeon;Supervision - Intermittent    Equipment Recommendations  None recommended by OT    Recommendations for Other Services       Precautions / Restrictions Precautions Precautions: Fall;Knee Precaution Comments: reviewed no pillow under knee Restrictions Weight Bearing Restrictions: No Other Position/Activity Restrictions: WBAT      Mobility Bed Mobility               General bed mobility comments: pt up with PT upon OT arrival  Transfers Overall transfer level: Needs assistance Equipment used: Rolling walker (2 wheeled) Transfers: Sit to/from Stand Sit to Stand: Min guard         General transfer comment: min guard A fro toilet    Balance Overall balance assessment: Needs assistance Sitting-balance support: No upper extremity supported;Feet supported;Feet unsupported Sitting balance-Leahy Scale: Good     Standing balance support: Single extremity supported;Bilateral upper extremity supported;During functional activity Standing balance-Leahy Scale: Fair                             ADL either performed or assessed with clinical judgement   ADL Overall ADL's : Needs assistance/impaired Eating/Feeding: Independent;Sitting   Grooming: Wash/dry hands;Wash/dry face;Standing;Supervision/safety   Upper Body Bathing: Set  up;Sitting   Lower Body Bathing: Minimal assistance   Upper Body Dressing : Set up;Sitting   Lower Body Dressing: Minimal assistance   Toilet Transfer: RW;Ambulation;Comfort height toilet;Grab bars   Toileting- Clothing Manipulation and Hygiene: Min guard;Sit to/from stand   Tub/ Shower Transfer: Min guard;Ambulation;Rolling walker;Grab bars;3 in 1   Functional mobility during ADLs: Min guard General ADL Comments: pt has wound vac R LE     Vision Patient Visual Report: No change from baseline       Perception     Praxis      Pertinent Vitals/Pain Pain Assessment: 0-10 Pain Score: 4  Pain Location: R knee Pain Descriptors / Indicators: Sore;Aching Pain Intervention(s): Monitored during session;Repositioned;Ice applied     Hand Dominance Right   Extremity/Trunk Assessment Upper Extremity Assessment Upper Extremity Assessment: Overall WFL for tasks assessed   Lower Extremity Assessment Lower Extremity Assessment: Defer to PT evaluation RLE Deficits / Details: assist for R LE required, ROM TBA       Communication Communication Communication: No difficulties   Cognition Arousal/Alertness: Awake/alert Behavior During Therapy: WFL for tasks assessed/performed Overall Cognitive Status: Within Functional Limits for tasks assessed                                     General Comments   pt very pleasant and cooperative    Exercises     Shoulder Instructions      Home Living Family/patient expects to be  discharged to:: Private residence Living Arrangements: Spouse/significant other Available Help at Discharge: Family Type of Home: House Home Access: Stairs to enter [employer] of Steps: 5 Entrance Stairs-Rails: Right Home Layout: Two level;Able to live on main level with bedroom/bathroom Alternate Level Stairs-Number of Steps: 14 Alternate Level Stairs-Rails: Right Bathroom Shower/Tub: Hospital doctor Toilet:  Handicapped height     Home Equipment: Environmental consultant - 2 wheels;Walker - 4 wheels   Additional Comments: 1/2 bath and single bed on main level      Prior Functioning/Environment Level of Independence: Independent with assistive device(s)  Gait / Transfers Assistance Needed: recently started using walker with 4 wheels, able to perform steps one at a time     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         OT Problem List: Decreased activity tolerance;Impaired balance (sitting and/or standing);Pain      OT Treatment/Interventions:      OT Goals(Current goals can be found in the care plan section) Acute Rehab OT Goals Patient Stated Goal: go home OT Goal Formulation: With patient  OT Frequency:     Barriers to D/C:    no barriers, pt will have assist at home from family and has all DME and A/E necessary from previous knee surgery       Co-evaluation              AM-PAC PT "6 Clicks" Daily Activity     Outcome Measure Help from another person eating meals?: None Help from another person taking care of personal grooming?: A Little Help from another person toileting, which includes using toliet, bedpan, or urinal?: A Little Help from another person bathing (including washing, rinsing, drying)?: A Little Help from another person to put on and taking off regular upper body clothing?: None Help from another person to put on and taking off regular lower body clothing?: A Little 6 Click Score: 20   End of Session Equipment Utilized During Treatment: Rolling walker;Other (comment)(3 in 1)  Activity Tolerance: Patient tolerated treatment well Patient left: in chair;with call bell/phone within reach;with chair alarm set  OT Visit Diagnosis: Other abnormalities of gait and mobility (R26.89);Pain                Time: 7416-3845 OT Time Calculation (min): 24 min Charges:  OT General Charges $OT Visit: 1 Visit OT Evaluation $OT Eval Low Complexity: 1 Low OT  Treatments $Therapeutic Activity: 8-22 mins G-Codes: OT G-codes **NOT FOR INPATIENT CLASS** Functional Assessment Tool Used: AM-PAC 6 Clicks Daily Activity     Britt Bottom 04/28/2017, 1:12 PM

## 2017-04-28 NOTE — Progress Notes (Signed)
Patient ID: [name], female   DOB: 17-Jun-1955, 61 y.o.   MRN: [id number] Subjective: 1 Day Post-Op Procedure(s) (LRB): Reimplantation of right total knee arthroplasty (Right)    Patient reports pain as mild to moderate.  No events over night  Objective:   VITALS:   Vitals:   04/28/17 0239 04/28/17 0547  BP: (!) 99/57 (!) 101/47  Pulse: 82 89  Resp: 16 16  Temp: 97.8 F (36.6 C) 98 F (36.7 C)  SpO2: 99% 100%    Neurovascular intact Incision: dressing C/D/I, Prevana suction dressing in place to [facility] unit for time being with plans to convert to portable unit at discharge  LABS Recent Labs    04/28/17 0545  HGB 9.1*  HCT 27.1*  WBC 6.4  PLT 162    Recent Labs    04/28/17 0545  NA 135  K 3.8  BUN 8  CREATININE 0.51  GLUCOSE 106*    No results for input(s): LABPT, INR in the last 72 hours.   Assessment/Plan: 1 Day Post-Op Procedure(s) (LRB): Reimplantation of right total knee arthroplasty (Right)  Peri-op VANCO dose today Advance diet Up with therapy   Will consult ID for their input on PO suppressive antibiotics for 3-6 months Hopeful discharge with everything arranged tomorrow versus Thursday

## 2017-04-28 NOTE — Consult Note (Addendum)
[place] for Infectious Disease  Total days of antibiotics 2        Day 2 vanco       Reason for Consult: prosthetic joint infection   Referring Physician: olin  Active Problems:   S/P revision of total knee, right    HPI: Laura LEPPLA is a 61 y.o. 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 She did not tolerate amoxicillin and broke out in a rash thus changed to oral linezolid x 4-6 weeks. Her right TKA was further c/b a hematoma in June. Cultures of drainage grew Peptostreptococcus and treated with several weeks of oral metronidazole. In July, she underwent resection arthroplasty and spacer placement. Operative cultures grew enterococcus again. She was treated with vancomycin and ceftriaxone for 6 weeks through 01/12/2017 plus oral linezolid x 3 weeks.   She only tolerated 3 wk due to pruritis but no rash, but also noticed to have blurry vision affecting both eyes that improved once she stopped taking meds.Marland Kitchenafter 3 days + washed out of her system  During this time period she also underwent skin grafting over her open right knee wound on 01/29/2017 which healed well. She is followed by Dr Wendie Simmer who last saw her in the beginning of October. Dr Alvan Dame removed her abtx spacer and placed a new prosthesis to right knee on 12/3.   Asked ID to weigh in on some chronic suppression given her complicated course.    Past Medical History:  Diagnosis Date  . 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 Allergic reaction 07/24/2016  . Anemia    needed transfusion spring of 2018  . Arthritis KNEES   right knee, hx. past left knee replacemnt.  . Blood clot in vein    around Portacath right chest-tx. Eliquis about a yr., prior Lovenox.  . Blood dyscrasia   . Chronic anticoagulation HX  PORT-A-CATH CLOT   2010 - HAS BEEN ON  LOVENOX SINCE THE CLOT  . Drug rash 07/24/2016  . Elevated blood pressure reading without diagnosis of hypertension    PT MONITORS HER B/P AT HOME  . Heart murmur   . 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 PONV (postoperative nausea and vomiting)    ponv likes scopolamine patch  . Port-A-Cath in place    RIGHT CHEST   . 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 Skin changes related to chemotherapy    per patient she has scabbed over skin circumventing port to r ight upper chest ; denie drainage and state it is due to chemptherapy   . Status post skin flap graft    right chest wall with metastatis right anterior chest -no drainage or open wound.    Allergies:  Allergies  Allergen Reactions  . Penicillins Hives, Itching and Rash    Has patient had a PCN reaction causing immediate rash, facial/tongue/throat swelling, SOB or lightheadedness with hypotension: no Has patient had a PCN reaction causing severe rash involving mucus  membranes or skin necrosis: No Has patient had a PCN reaction that required hospitalization No Has patient had a PCN reaction occurring within the last 10 years: yes If all of the above answers are "NO", then may proceed with Cephalosporin use.   Denies airway involvement   . Other Rash    STERI STRIPS - Blisters  . Tape Hives    Can tolerate paper tape    MEDICATIONS: . celecoxib  200 mg Oral Q12H  . docusate sodium  100 mg Oral BID  . enoxaparin (LOVENOX) injection  40 mg Subcutaneous Q24H   Followed by  . [START ON 04/30/2017] enoxaparin (LOVENOX) injection  70 mg Subcutaneous Q24H  . fentaNYL (SUBLIMAZE) injection  50-100 mcg Intravenous UD  . ferrous sulfate  325 mg Oral  TID PC  . gabapentin  300 mg Oral TID  . loratadine  10 mg Oral Daily  . polyethylene glycol  17 g Oral BID    Social History   Tobacco Use  . Smoking status: Never Smoker  . Smokeless tobacco: Never Used  Substance Use Topics  . Alcohol use: No  . Drug use: No    Family History  Problem Relation Age of Onset  . Heart disease Mother        due to mitral valve regurgiation,   . Cancer Mother        breast  . Allergic rhinitis Mother   . Parkinsonism Father   . Allergic rhinitis Father   . Migraines Father   . Alcohol abuse Paternal Grandfather   . Angioedema Neg Hx   . Asthma Neg Hx   . Eczema Neg Hx      Review of Systems  Constitutional: Negative for fever, chills, diaphoresis, activity change, appetite change, fatigue and unexpected weight change.  HENT: Negative for congestion, sore throat, rhinorrhea, sneezing, trouble swallowing and sinus pressure.  Eyes: Negative for photophobia and visual disturbance.  Respiratory: Negative for cough, chest tightness, shortness of breath, wheezing and stridor.  Cardiovascular: Negative for chest pain, palpitations and leg swelling.  Gastrointestinal: Negative for nausea, vomiting, abdominal pain, diarrhea, constipation, blood in stool, abdominal distention and anal bleeding.  Genitourinary: Negative for dysuria, hematuria, flank pain and difficulty urinating.  Musculoskeletal: Negative for myalgias, back pain, joint swelling, arthralgias and gait problem.  Skin: rash to right upper chest from metastatic breast ca Neurological: Negative for dizziness, tremors, weakness and light-headedness.  Hematological: Negative for adenopathy. Does not bruise/bleed easily.  Psychiatric/Behavioral: Negative for behavioral problems, confusion, sleep disturbance, dysphoric mood, decreased concentration and agitation.     OBJECTIVE: Temp:  [97.6 F (36.4 C)-99.5 F (37.5 C)] 98.7 F (37.1 C) (12/04 1425) Pulse Rate:  [82-106] 94 (12/04  1425) Resp:  [10-17] 16 (12/04 1425) BP: (97-133)/(45-94) 104/54 (12/04 1425) SpO2:  [94 %-100 %] 98 % (12/04 1425) Physical Exam  Constitutional:  oriented to person, place, and time. appears well-developed and well-nourished. No distress.  HENT: [place]/AT, PERRLA, no scleral icterus Mouth/Throat: Oropharynx is clear and moist. No oropharyngeal exudate.  Chest wall= erythema to base of neck and right shoulder/upper chest wall fungating lesions Cardiovascular: Normal rate, regular rhythm and normal heart sounds. Exam reveals no gallop and no friction rub.  No murmur heard.  Pulmonary/Chest: Effort normal and breath sounds normal. No respiratory distress.  has no wheezes.  Neck = supple, no nuchal rigidity Ext: right knee has wound vac in place. Neurological: alert and oriented to person, place, and time.  Skin: Skin is warm  and dry. No rash noted. No erythema.  Psychiatric: a normal mood and affect.  behavior is normal.    LABS: Results for orders placed or performed during the hospital encounter of 04/27/17 (from the past 48 hour(s))  Type and screen [place]     Status: None   Collection Time: 04/27/17  9:35 AM  Result Value Ref Range   ABO/RH(D) A POS    Antibody Screen POS    Sample Expiration 04/30/2017   Aerobic/Anaerobic Culture (surgical/deep wound)     Status: None (Preliminary result)   Collection Time: 04/27/17  1:11 PM  Result Value Ref Range   Specimen Description SYNOVIAL RIGHT KNEE    Special Requests NONE    Gram Stain      NO ORGANISMS SEEN NO WBC SEEN Gram Stain Report Called to,Read Back By and Verified With: M.ROBINS RN AT 4742 ON 04/27/17 BY S.VANHOORNE    Culture      NO GROWTH < 24 HOURS Performed at [facility], [address], [place], [place] [address]    Report Status PENDING   CBC     Status: Abnormal   Collection Time: 04/28/17  5:45 AM  Result Value Ref Range   WBC 6.4 4.0 - 10.5 K/uL   RBC 2.85 (L) 3.87 - 5.11 MIL/uL     Hemoglobin 9.1 (L) 12.0 - 15.0 g/dL   HCT 27.1 (L) 36.0 - 46.0 %   MCV 95.1 78.0 - 100.0 fL   MCH 31.9 26.0 - 34.0 pg   MCHC 33.6 30.0 - 36.0 g/dL   RDW 16.1 (H) 11.5 - 15.5 %   Platelets 162 150 - 400 K/uL  Basic metabolic panel     Status: Abnormal   Collection Time: 04/28/17  5:45 AM  Result Value Ref Range   Sodium 135 135 - 145 mmol/L   Potassium 3.8 3.5 - 5.1 mmol/L   Chloride 101 101 - 111 mmol/L   CO2 28 22 - 32 mmol/L   Glucose, Bld 106 (H) 65 - 99 mg/dL   BUN 8 6 - 20 mg/dL   Creatinine, Ser 0.51 0.44 - 1.00 mg/dL   Calcium 8.5 (L) 8.9 - 10.3 mg/dL   GFR calc non Af Amer >60 >60 mL/min   GFR calc Af Amer >60 >60 mL/min    Comment: (NOTE) The eGFR has been calculated using the CKD EPI equation. This calculation has not been validated in all clinical situations. eGFR's persistently <60 mL/min signify possible Chronic Kidney Disease.    Anion gap 6 5 - 15    MICRO: 12/3 cx ngtd IMAGING: No results found.  HISTORICAL MICRO/IMAGING  Assessment/Plan:  61yo F with hx of enterococcal (and remote peptostreptococcal) pji. Most recently treated with IV vancomycin/ceftriaxone through 8/20 plus oral linezolid x 3 wk I am concerned that she is not a good candidate for linezolid again due her having blurry vision the last time she was exposed to it. It has a SE of optic neuritis -sometimes not reversible.  -will research if tedizolid also has same SE profile as potential replacement - will check with micro in the morning to look at her archived isolate from July to see if it is sensitive to doxycycline or ciprofloxacin (both of which patient has tolerated in the past)  - for now will keep on vancomycin and start oral agent in the morning - if unable to get any further data from her prior isolates, would treat with doxycycline 176m  po bid (on full stomach) plus cipro 541m bid - as long as the isolates are sensitive to these drugs. If the isolate is resistant to either one,  would probably need to do IV vancomycin for 6 wk  Hx of metastatic breast ca= she has skin lesions near her portacath that may exclude it from u[place]for prolonged iv infusion  Spent 110 min with patient greater than 50% in coordination of care for the above

## 2017-04-28 NOTE — Addendum Note (Signed)
Addendum  created 04/28/17 1027 by Lollie Sails, CRNA   Charge Capture section accepted

## 2017-04-28 NOTE — Progress Notes (Signed)
Pharmacy Antibiotic Note  [name] is a 61 y.o. female admitted on 04/27/2017 with PJI.  Pharmacy has been consulted for vancomycin dosing.    Given 1g vancomycin pre-op and post-op 12/3.  Post-op dose given at 11:30pm last night (~20 hours ago so will plan to provide patient with loading dose).  Plan: Vancomycin 1500 mg IV x 1 then 750 mg IV q24h for goal AUC 400-500. F/u SCr, culture results, ID recs.  Height: 5\' 7"  (170.2 cm) Weight: 161 lb (73 kg) IBW/kg (Calculated) : 61.6  Temp (24hrs), Avg:98.5 F (36.9 C), Min:97.8 F (36.6 C), Max:99.5 F (37.5 C)  Recent Labs  Lab 04/24/17 1115 04/28/17 0545  WBC 6.5 6.4  CREATININE 0.54 0.51    Estimated Creatinine Clearance: 72.7 mL/min (by C-G formula based on SCr of 0.51 mg/dL).    Allergies  Allergen Reactions  . Penicillins Hives, Itching and Rash    Has patient had a PCN reaction causing immediate rash, facial/tongue/throat swelling, SOB or lightheadedness with hypotension: no Has patient had a PCN reaction causing severe rash involving mucus membranes or skin necrosis: No Has patient had a PCN reaction that required hospitalization No Has patient had a PCN reaction occurring within the last 10 years: yes If all of the above answers are "NO", then may proceed with Cephalosporin use.   Denies airway involvement   . Other Rash    STERI STRIPS - Blisters  . Tape Hives    Can tolerate paper tape    Antimicrobials this admission: 12/4 Vancomycin >>  Dose adjustments this admission:  Microbiology results: 12/3 synovial fluid from right knee: ngtd   Thank you for allowing pharmacy to be a part of this patient's care.  Hershal Coria 04/28/2017 6:05 PM

## 2017-04-29 DIAGNOSIS — A491 Streptococcal infection, unspecified site: Secondary | ICD-10-CM

## 2017-04-29 LAB — CBC
HEMATOCRIT: 27.9 % — AB (ref 36.0–46.0)
HEMOGLOBIN: 9.1 g/dL — AB (ref 12.0–15.0)
MCH: 31.5 pg (ref 26.0–34.0)
MCHC: 32.6 g/dL (ref 30.0–36.0)
MCV: 96.5 fL (ref 78.0–100.0)
Platelets: 161 10*3/uL (ref 150–400)
RBC: 2.89 MIL/uL — AB (ref 3.87–5.11)
RDW: 16.7 % — ABNORMAL HIGH (ref 11.5–15.5)
WBC: 7.7 10*3/uL (ref 4.0–10.5)

## 2017-04-29 LAB — BASIC METABOLIC PANEL
ANION GAP: 6 (ref 5–15)
BUN: 9 mg/dL (ref 6–20)
CALCIUM: 8.7 mg/dL — AB (ref 8.9–10.3)
CHLORIDE: 103 mmol/L (ref 101–111)
CO2: 28 mmol/L (ref 22–32)
Creatinine, Ser: 0.51 mg/dL (ref 0.44–1.00)
GFR calc non Af Amer: >60 mL/min (ref >60)
Glucose, Bld: 91 mg/dL (ref 65–99)
POTASSIUM: 3.7 mmol/L (ref 3.5–5.1)
Sodium: 137 mmol/L (ref 135–145)

## 2017-04-29 MED ORDER — VANCOMYCIN IV (FOR PTA / DISCHARGE USE ONLY): 750.0000 mg | Freq: Two times a day (BID) | INTRAVENOUS | 0 refills | Status: AC

## 2017-04-29 NOTE — Progress Notes (Addendum)
[place] for Infectious Disease    Date of Admission:  04/27/2017   Total days of antibiotics 2        Day 2 vanco           ID: [name] is a 61 y.o. female with hx of enterococcal pji, 2 staged revision with new total knee arthroplasty on 12/3 Active Problems:   S/P revision of total knee, right    S: afebrile, less knee pain  Medications:  . celecoxib  200 mg Oral Q12H  . docusate sodium  100 mg Oral BID  . [START ON 04/30/2017] enoxaparin (LOVENOX) injection  70 mg Subcutaneous Q24H  . fentaNYL (SUBLIMAZE) injection  50-100 mcg Intravenous UD  . ferrous sulfate  325 mg Oral TID PC  . gabapentin  300 mg Oral TID  . loratadine  10 mg Oral Daily  . polyethylene glycol  17 g Oral BID    Objective: Vital signs in last 24 hours: Temp:  [98.5 F (36.9 C)-98.7 F (37.1 C)] 98.5 F (36.9 C) (12/05 0444) Pulse Rate:  [88-96] 88 (12/05 0444) Resp:  [16-17] 16 (12/05 0444) BP: (104-118)/(54-59) 118/59 (12/05 0444) SpO2:  [93 %-98 %] 98 % (12/05 0444) Physical Exam  Constitutional:  oriented to person, place, and time. appears well-developed and well-nourished. No distress.  HENT: [place]/AT, PERRLA, no scleral icterus Mouth/Throat: Oropharynx is clear and moist. No oropharyngeal exudate.  Cardiovascular: Normal rate, regular rhythm and normal heart sounds. Exam reveals no gallop and no friction rub.  No murmur heard.  Pulmonary/Chest: Effort normal and breath sounds normal. No respiratory distress.  has no wheezes.  Chest wall = 3 small island of raised clustered lesions c/w metastatic skin lesions associated with breast ca. Surgical incisional scars well healed from bilat mastectomy Ext: right knee wrapped with wound vac in place Psychiatric: a normal mood and affect.  behavior is normal.   Lab Results Recent Labs    04/28/17 0545 04/29/17 0617  WBC 6.4 7.7  HGB 9.1* 9.1*  HCT 27.1* 27.9*  NA 135 137  K 3.8 3.7  CL 101 103  CO2 28 28  BUN 8 9  CREATININE  0.51 0.51    Microbiology: 12/3 no growth Studies/Results: No results found.   Assessment/Plan:  Hx of pji with enterococcus faecalis - I have reviewed data from prior e. faecalis isolates ( R tetracycline S CIPRO in Jan but no data from the July isolate ),  given her penicillin allergy and SE to linezolid, I do not think we have an oral abtx option at this time.  - would recommend to do 6 wk of IV vancomycin for prophylaxis, minimize risk of re-infection. This plan has also been agreed by Dr Megan Salon who has been following her and managing her throughout the year. - patient is agreeable, and have coordinated with [facility] for  orders and opat orders for once a day vanco infusion through a new picc line  Metastatic skin lesions precludes [place] from using her portacath safely- too high risk for infection given there is limitation for dressing  I have placed picc line orders, possibly left arm. No s/o lymphadema since her mastectomy over 78yrs ago.  Will arrange for follow up with dr Megan Salon in 4-6 wk ------------------ Diagnosis: Hx of enterococcus pji  Culture Result: negative  Allergies  Allergen Reactions  . Penicillins Hives, Itching and Rash    Has patient had a PCN reaction causing immediate rash, facial/tongue/throat swelling, SOB  or lightheadedness with hypotension: no Has patient had a PCN reaction causing severe rash involving mucus membranes or skin necrosis: No Has patient had a PCN reaction that required hospitalization No Has patient had a PCN reaction occurring within the last 10 years: yes If all of the above answers are "NO", then may proceed with Cephalosporin use.   Denies airway involvement   . Other Rash    STERI STRIPS - Blisters  . Tape Hives    Can tolerate paper tape    OPAT Orders Discharge antibiotics: Per pharmacy protocol Aim for Vancomycin trough 15-20 (unless otherwise indicated) Duration: 6 wk End Date: Jan 15th [place] Per  Protocol:  Labs weekly while on IV antibiotics: _x_ CBC with differential _x_ BMP twice a week _x_ Vancomycin trough   __x Please leave PIC in place until doctor has seen patient or been notified  Fax weekly labs to [phone]  Clinic Follow Up Appt: In 4-6 wk with dr Megan Salon  @    [facility], [facility] for Infectious Diseases Cell: [phone] Pager: [phone]  04/29/2017, 12:14 PM        Elzie Rings. [place] for Infectious Diseases [phone]

## 2017-04-29 NOTE — Progress Notes (Signed)
PHARMACY CONSULT NOTE FOR:  OUTPATIENT  PARENTERAL ANTIBIOTIC THERAPY (OPAT)  Indication: prosthetic joint infection Regimen: vancomycin 750 mg IV q12h End date: 06/09/17  IV antibiotic discharge orders are pended. To discharging provider:  please sign these orders via discharge navigator,  Select New Orders & click on the button choice - Manage This Unsigned Work.     Thank you for allowing pharmacy to be a part of this patient's care.  Dia Sitter P 04/29/2017, 1:21 PM

## 2017-04-29 NOTE — Progress Notes (Signed)
Discharge planning, spoke with patient and spouse at beside. Plan for IV abx. Chose [facility] for [facility] services, contacted [facility] for referral. Has DME. requesting PICC line d/t issues with portacath. Will f/u with attending. [phone]

## 2017-04-29 NOTE — Progress Notes (Signed)
Physical Therapy Treatment Patient Details Name: [name] MRN: [id number] DOB: 1955/07/09 Today's Date: 04/29/2017    History of Present Illness Pt is a 61 year old female s/p Reimplantation of right total knee arthroplasty 04/27/17 with hx of right knee I&D, antibiotic spacer    PT Comments    Pt ambulated in hallway good distance.  Pt did not feel need to practice steps as she has been doing these at home and able to verbalize correct technique.  Pt performed LE exercises and provided with HEP handout.  Pt had no further questions and hopeful to d/c home today pending Infectious Disease recommendations.   Follow Up Recommendations  Outpatient PT;DC plan and follow up therapy as arranged by surgeon     Equipment Recommendations  None recommended by PT    Recommendations for Other Services       Precautions / Restrictions Precautions Precautions: Fall;Knee Precaution Comments: R knee NPWT Restrictions Other Position/Activity Restrictions: WBAT    Mobility  Bed Mobility               General bed mobility comments: up in recliner on arrival  Transfers Overall transfer level: Needs assistance Equipment used: Rolling walker (2 wheeled) Transfers: Sit to/from Stand Sit to Stand: Supervision         General transfer comment: supervision for safety, difficulty with rise  however able to complete without assist  Ambulation/Gait Ambulation/Gait assistance: Supervision Ambulation Distance (Feet): 180 Feet Assistive device: Rolling walker (2 wheeled) Gait Pattern/deviations: Step-to pattern;Decreased stance time - right;Antalgic     General Gait Details: distance to tolerance, cues for heel strike and knee flexion as able   Stairs            Wheelchair Mobility    Modified Rankin (Stroke Patients Only)       Balance                                            Cognition Arousal/Alertness: Awake/alert Behavior During Therapy:  WFL for tasks assessed/performed Overall Cognitive Status: Within Functional Limits for tasks assessed                                        Exercises Total Joint Exercises Ankle Circles/Pumps: AROM;Both;10 reps Quad Sets: AROM;Both;10 reps Heel Slides: AAROM;Seated;Right;10 reps Hip ABduction/ADduction: AROM;Right;10 reps Straight Leg Raises: Right;10 reps;AROM Long Arc Quad: AAROM;Right;10 reps;Seated    General Comments        Pertinent Vitals/Pain Pain Assessment: 0-10 Pain Score: 4  Pain Location: R knee Pain Descriptors / Indicators: Sore;Aching Pain Intervention(s): Repositioned;Limited activity within patient's tolerance;Monitored during session;Ice applied    Home Living                      Prior Function            PT Goals (current goals can now be found in the care plan section) Progress towards PT goals: Progressing toward goals    Frequency    7X/week      PT Plan Current plan remains appropriate    Co-evaluation              AM-PAC PT "6 Clicks" Daily Activity  Outcome Measure  Difficulty turning over in bed (including adjusting bedclothes,  sheets and blankets)?: A Little Difficulty moving from lying on back to sitting on the side of the bed? : A Little Difficulty sitting down on and standing up from a chair with arms (e.g., wheelchair, bedside commode, etc,.)?: A Little Help needed moving to and from a bed to chair (including a wheelchair)?: A Little Help needed walking in hospital room?: A Little Help needed climbing 3-5 steps with a railing? : A Little 6 Click Score: 18    End of Session   Activity Tolerance: Patient tolerated treatment well Patient left: with call bell/phone within reach;in chair;with family/visitor present   PT Visit Diagnosis: Other abnormalities of gait and mobility (R26.89)     Time: 1552-0802 PT Time Calculation (min) (ACUTE ONLY): 26 min  Charges:  $Gait Training: 8-22  mins $Therapeutic Exercise: 8-22 mins                    G Codes:       [name], PT, DPT 04/29/2017 Pager: [phone]  [name] 04/29/2017, 11:32 AM

## 2017-04-29 NOTE — Progress Notes (Signed)
Patient ID: [name], female   DOB: 17-Jan-1956, 61 y.o.   MRN: [id number] Subjective: 2 Days Post-Op Procedure(s) (LRB): Reimplantation of right total knee arthroplasty (Right)    Patient reports pain as mild.  Doing well overall, no events Appreciate ID involvement and recs  Objective:   VITALS:   Vitals:   04/28/17 2223 04/29/17 0444  BP: (!) 107/58 (!) 118/59  Pulse: 96 88  Resp: 17 16  Temp: 98.6 F (37 C) 98.5 F (36.9 C)  SpO2: 93% 98%    Neurovascular intact Incision: dressing C/D/I - Prevana dressing in place  LABS Recent Labs    04/28/17 0545 04/29/17 0617  HGB 9.1* 9.1*  HCT 27.1* 27.9*  WBC 6.4 7.7  PLT 162 161    Recent Labs    04/28/17 0545 04/29/17 0617  NA 135 137  K 3.8 3.7  BUN 8 9  CREATININE 0.51 0.51  GLUCOSE 106* 91    No results for input(s): LABPT, INR in the last 72 hours.   Assessment/Plan: 2 Days Post-Op Procedure(s) (LRB): Reimplantation of right total knee arthroplasty (Right)   Advance diet Up with therapy  Per ID at this point will continue IV VANCO for prophylaxis for 6 weeks PIC line ordered, waiting placement hopefully today verus tomorrow D/C pending placement Otherwise up with therapy to work on regaining function When discharged prevana portable suction unit will be attached

## 2017-04-29 NOTE — Progress Notes (Addendum)
Pharmacy Antibiotic Note  [name] is a 61 y.o. female admitted on 04/27/2017 for reimplantation of TKA after completing antibiotic treatment for PJI.  Pharmacy was consulted for vancomycin dosing on 04/28/2017 - received 1500 mg IV x 1, then was placed on 750 mg IV q12h for goal AUC 400-500.  Please see note from Dr. Baxter Flattery 12/4 regarding previous antibiotic therapy and potential options for oral suppressive therapy.  Today, 04/29/2017: D#2 vancomycin 750 mg IV q12h SCr stable Remains afebrile WBC remains WNL OR culture from 12/3 (synovium R knee): ngtd (last read at <24 hrs after collection)    Plan: For now, continue vancomycin 750 mg IV q12h for goal AUC 400-500. F/u SCr, culture results Await further ID recommendations and orders.  Height: 5\' 7"  (170.2 cm) Weight: 161 lb (73 kg) IBW/kg (Calculated) : 61.6  Temp (24hrs), Avg:98.8 F (37.1 C), Min:98.5 F (36.9 C), Max:99.5 F (37.5 C)  Recent Labs  Lab 04/24/17 1115 04/28/17 0545 04/29/17 0617  WBC 6.5 6.4 7.7  CREATININE 0.54 0.51 0.51    Estimated Creatinine Clearance: 72.7 mL/min (by C-G formula based on SCr of 0.51 mg/dL).    Allergies  Allergen Reactions  . Penicillins Hives, Itching and Rash    Has patient had a PCN reaction causing immediate rash, facial/tongue/throat swelling, SOB or lightheadedness with hypotension: no Has patient had a PCN reaction causing severe rash involving mucus membranes or skin necrosis: No Has patient had a PCN reaction that required hospitalization No Has patient had a PCN reaction occurring within the last 10 years: yes If all of the above answers are "NO", then may proceed with Cephalosporin use.   Denies airway involvement   . Other Rash    STERI STRIPS - Blisters  . Tape Hives    Can tolerate paper tape    Antimicrobials this admission: 12/4 Vancomycin >>  Dose adjustments this admission:  Microbiology results: 12/3 synovial fluid from right knee: ngtd    Thank you for allowing pharmacy to be a part of this patient's care.  Clayburn Pert, PharmD, BCPS Pager: [phone] 04/29/2017  9:21 AM

## 2017-04-29 NOTE — Plan of Care (Signed)
  Progressing Education: Knowledge of General Education information will improve 04/29/2017 1502 - Progressing by Milderd Meager, RN Health Behavior/Discharge Planning: Ability to manage health-related needs will improve 04/29/2017 1502 - Progressing by Milderd Meager, RN Clinical Measurements: Ability to maintain clinical measurements within normal limits will improve 04/29/2017 1502 - Progressing by Milderd Meager, RN Will remain free from infection 04/29/2017 1502 - Progressing by Milderd Meager, RN Diagnostic test results will improve 04/29/2017 1502 - Progressing by Milderd Meager, RN Respiratory complications will improve 04/29/2017 1502 - Progressing by Milderd Meager, RN Cardiovascular complication will be avoided 04/29/2017 1502 - Progressing by Milderd Meager, RN Activity: Risk for activity intolerance will decrease 04/29/2017 1502 - Progressing by Milderd Meager, RN Elimination: Will not experience complications related to bowel motility 04/29/2017 1502 - Progressing by Milderd Meager, RN Pain Managment: General experience of comfort will improve 04/29/2017 1502 - Progressing by Milderd Meager, RN Safety: Ability to remain free from injury will improve 04/29/2017 1502 - Progressing by Milderd Meager, RN Skin Integrity: Risk for impaired skin integrity will decrease 04/29/2017 1502 - Progressing by Milderd Meager, RN Education: Knowledge of the prescribed therapeutic regimen will improve 04/29/2017 1502 - Progressing by Milderd Meager, RN Activity: Ability to avoid complications of mobility impairment will improve 04/29/2017 1502 - Progressing by Milderd Meager, RN Range of joint motion will improve 04/29/2017 1502 - Progressing by Milderd Meager, RN Clinical Measurements: Postoperative complications will be avoided or minimized 04/29/2017 1502 - Progressing by Milderd Meager, RN Pain Management: Pain level will  decrease with appropriate interventions 04/29/2017 1502 - Progressing by Milderd Meager, RN Skin Integrity: Signs of wound healing will improve 04/29/2017 1502 - Progressing by Milderd Meager, RN

## 2017-04-30 ENCOUNTER — Inpatient Hospital Stay (HOSPITAL_COMMUNITY): Payer: 59

## 2017-04-30 ENCOUNTER — Encounter (HOSPITAL_COMMUNITY): Payer: Self-pay | Admitting: Radiology

## 2017-04-30 HISTORY — PX: IR US GUIDE VASC ACCESS LEFT: IMG2389

## 2017-04-30 HISTORY — PX: IR FLUORO GUIDE CV LINE LEFT: IMG2282

## 2017-04-30 MED ORDER — ENOXAPARIN SODIUM 80 MG/0.8ML ~~LOC~~ SOLN: 40.0000 mg | SUBCUTANEOUS | 0 refills | Status: DC

## 2017-04-30 MED ORDER — LIDOCAINE HCL 1 % IJ SOLN
INTRAMUSCULAR | Status: AC
  Filled 2017-04-30: qty 20

## 2017-04-30 MED ORDER — FUROSEMIDE 20 MG PO TABS: 20.0000 mg | ORAL_TABLET | Freq: Every day | ORAL | 0 refills | Status: DC | PRN

## 2017-04-30 MED ORDER — CELECOXIB 200 MG PO CAPS: 200.0000 mg | ORAL_CAPSULE | Freq: Two times a day (BID) | ORAL | 1 refills | Status: DC

## 2017-04-30 MED ORDER — LIDOCAINE HCL 1 % IJ SOLN
INTRAMUSCULAR | Status: AC | PRN
  Administered 2017-04-30: 5 mL via INTRADERMAL

## 2017-04-30 MED ORDER — DOCUSATE SODIUM 100 MG PO CAPS: 100.0000 mg | ORAL_CAPSULE | Freq: Two times a day (BID) | ORAL | 0 refills | Status: DC

## 2017-04-30 MED ORDER — FUROSEMIDE 20 MG PO TABS: 20.0000 mg | ORAL_TABLET | Freq: Every day | ORAL | Status: DC | PRN

## 2017-04-30 MED FILL — FUROSEMIDE 20 MG TABS: 20 | 30 days supply | Qty: 30 | Fill #0

## 2017-04-30 MED FILL — CELECOXIB 200 MG CAP: 200 | 30 days supply | Qty: 60 | Fill #0

## 2017-04-30 MED FILL — ENOXAPARIN 40 MG/0.4 ML SYR: 40 | 14 days supply | Qty: 6 | Fill #0

## 2017-04-30 NOTE — Progress Notes (Signed)
Patient ID: [name], female   DOB: 10/12/1955, 61 y.o.   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 Pain mainly medially, worse after therapy.  Notes incisional based pain.  No significant events, ready to get home  Objective:   VITALS:   Vitals:   04/29/17 2027 04/30/17 0448  BP: (!) 107/56 (!) 124/53  Pulse: (!) 111 (!) 113  Resp: 16 16  Temp: 99.4 F (37.4 C) 98.9 F (37.2 C)  SpO2: 96% 94%    Neurovascular intact Incision: dressing C/D/I - Prevana dressing to suction Swelling of RLE with medial based ecchymosis    LABS Recent Labs    04/28/17 0545 04/29/17 0617  HGB 9.1* 9.1*  HCT 27.1* 27.9*  WBC 6.4 7.7  PLT 162 161    Recent Labs    04/28/17 0545 04/29/17 0617  NA 135 137  K 3.8 3.7  BUN 8 9  CREATININE 0.51 0.51  GLUCOSE 106* 91    No results for input(s): LABPT, INR in the last 72 hours.   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

## 2017-04-30 NOTE — Progress Notes (Signed)
MEDICATION-RELATED CONSULT NOTE   IR Procedure Consult - Anticoagulant/Antiplatelet PTA/Inpatient Med List Review by Pharmacist    Procedure: Ultrasound/fluoroscopic guided placement of left single-lumen basilic vein PICC    Completed: 12/6, 1700  Post-Procedural bleeding risk per IR MD assessment:  standard  Antithrombotic medications on inpatient or PTA profile prior to procedure:      enoxaparin 70mg  q24h   Recommended restart time per IR Post-Procedure Guidelines:    Day + 1 (next am) Other considerations:      Plan:     Continue enoxaparin 70mg  q24h scheduled for 0800 on 12/7  Dolly Rias RPh 04/30/2017, 5:14 PM Pager [phone]

## 2017-04-30 NOTE — Procedures (Addendum)
Ultrasound/fluoroscopic guided placement of left single-lumen basilic vein PICC.  Length 43 cm.  Tip SVC/right atrial junction.  No immediate complications.  Medication used-1 % lidocaine to skin and subcutaneous tissue.  Okay to use.  Risks and complications of procedure were discussed with patient by Dr. Laurence Ferrari (IR) and patient agreed to proceed with PICC placement.  She is on chronic Lovenox therapy which makes risk for upper extremity thromboembolic complications low with PICC placement.

## 2017-04-30 NOTE — Progress Notes (Addendum)
Patient has order for PICC placement , however she has had bilateral mastectomy and has PAC on right chest . Would recommend IR to place a left IJ tunneled catheter to avoid the veins in the arm. Primary RN aware and will notify MD.

## 2017-04-30 NOTE — Progress Notes (Signed)
Patient went home with Picc line in place, for home antibiotics.

## 2017-04-30 NOTE — Progress Notes (Signed)
Physical Therapy Treatment Patient Details Name: [name] MRN: [id number] DOB: 1955-07-29 Today's Date: 04/30/2017    History of Present Illness Pt is a 61 year old female s/p Reimplantation of right total knee arthroplasty 04/27/17 with hx of right knee I&D, antibiotic spacer    PT Comments    Pt awaiting PICC line placement today.  Pt agreeable to ambulate in hallway and performed exercises to tolerance.  Pt reports increased soreness today so reduced exercise repetition and provided ice end of session.   Follow Up Recommendations  Outpatient PT;DC plan and follow up therapy as arranged by surgeon     Equipment Recommendations  None recommended by PT    Recommendations for Other Services       Precautions / Restrictions Precautions Precautions: Fall;Knee Precaution Comments: R knee NPWT Restrictions Other Position/Activity Restrictions: WBAT    Mobility  Bed Mobility Overal bed mobility: Modified Independent                Transfers Overall transfer level: Needs assistance Equipment used: Rolling walker (2 wheeled) Transfers: Sit to/from Stand Sit to Stand: Supervision         General transfer comment: supervision for safety  Ambulation/Gait Ambulation/Gait assistance: Supervision Ambulation Distance (Feet): 120 Feet Assistive device: Rolling walker (2 wheeled) Gait Pattern/deviations: Step-to pattern;Decreased stance time - right;Antalgic     General Gait Details: distance to tolerance, cues for heel strike and knee flexion as able   Stairs            Wheelchair Mobility    Modified Rankin (Stroke Patients Only)       Balance                                            Cognition Arousal/Alertness: Awake/alert Behavior During Therapy: WFL for tasks assessed/performed Overall Cognitive Status: Within Functional Limits for tasks assessed                                        Exercises Total  Joint Exercises Ankle Circles/Pumps: AROM;Both;10 reps Quad Sets: AROM;Both;5 reps Short Arc Quad: Right;5 reps;AAROM Heel Slides: AAROM;Right;5 reps Hip ABduction/ADduction: AROM;Right;5 reps Straight Leg Raises: Right;AAROM;5 reps    General Comments        Pertinent Vitals/Pain Pain Assessment: 0-10 Pain Score: 3  Pain Location: R knee Pain Descriptors / Indicators: Sore;Aching Pain Intervention(s): Repositioned;Monitored during session;Limited activity within patient's tolerance;Ice applied    Home Living                      Prior Function            PT Goals (current goals can now be found in the care plan section) Progress towards PT goals: Progressing toward goals    Frequency    7X/week      PT Plan Current plan remains appropriate    Co-evaluation              AM-PAC PT "6 Clicks" Daily Activity  Outcome Measure  Difficulty turning over in bed (including adjusting bedclothes, sheets and blankets)?: None Difficulty moving from lying on back to sitting on the side of the bed? : None Difficulty sitting down on and standing up from a chair with arms ([name].g., wheelchair, bedside  commode, etc,.)?: A Little Help needed moving to and from a bed to chair (including a wheelchair)?: A Little Help needed walking in hospital room?: A Little Help needed climbing 3-5 steps with a railing? : A Little 6 Click Score: 20    End of Session   Activity Tolerance: Patient tolerated treatment well Patient left: with call bell/phone within reach;with family/visitor present;in bed   PT Visit Diagnosis: Other abnormalities of gait and mobility (R26.89)     Time: 1658-0063 PT Time Calculation (min) (ACUTE ONLY): 20 min  Charges:  $Gait Training: 8-22 mins                    G Codes:      [name], PT, DPT 04/30/2017 Pager: [phone]   [name] 04/30/2017, 2:48 PM

## 2017-04-30 NOTE — Progress Notes (Signed)
[place] has reviewed POC for home with pt, husband and daughter on 04-29-17.  [facility] is prepared for DC today once PICC is placed.  Pt and husband are independent to administer PM dose of IV ABX tonight after DC home.  If patient discharges after hours, please call [phone].   Larry Sierras 04/30/2017, 11:31 AM

## 2017-05-01 ENCOUNTER — Encounter: Payer: Self-pay | Admitting: *Deleted

## 2017-05-01 ENCOUNTER — Other Ambulatory Visit: Payer: Self-pay | Admitting: *Deleted

## 2017-05-01 DIAGNOSIS — T8453XA Infection and inflammatory reaction due to internal right knee prosthesis, initial encounter: Secondary | ICD-10-CM | POA: Diagnosis not present

## 2017-05-01 DIAGNOSIS — T8459XA Infection and inflammatory reaction due to other internal joint prosthesis, initial encounter: Secondary | ICD-10-CM | POA: Diagnosis not present

## 2017-05-01 DIAGNOSIS — B952 Enterococcus as the cause of diseases classified elsewhere: Secondary | ICD-10-CM | POA: Diagnosis not present

## 2017-05-01 NOTE — Patient Outreach (Signed)
[name]) Care Management  05/01/2017  [name] 05-23-1956 [id number]   Subjective: Received approval  for benefit exception / override for member to receive home health and outpatient therapy as needed.  Telephone call to patient's home number, spoke with patient, and HIPAA verified.  RNCM advised patient of issue research outcome and above benefit exception / override approval.   Advised patient that benefit exception / override is on file at [name], to proceed with obtaining needed home health, and outpatient physical therapy services.  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   Status post Revision/reimplantation of right total knee replacement utilizing components from [employer] knee system with a size 3 TC3 femur, 4 mm distal medial augment, 8 mm distal lateral augment, 4 mm posterior medial augment and +2 bolt, 5-degree adapter with a 13 x 60 mm cemented stem  The tibial tray with a size 3 MBT revision tray with 5 mm medial and lateral augments, a size 29 cemented sleeve and a 13 x 30 cemented stem. We used the size 15 posterior stabilized insert to match the size 3 femur and a 38 patellar button on 04/27/17. Patient hospitalized 12/12/16 - 12/19/16 for anemia.Patient hospitalized 12/02/16 -12/05/16 for Infected right total knee arthroplasty. Status post EXCISIONAL TOTAL KNEE ARTHROPLASTY WITH ANTIBIOTIC SPACERS on 12/02/16. Status post Resection of right total knee arthroplasty, placement of antibiotic articulating spacer on 12/03/16. Patient also hospitalized 03/20/16 -03/23/16 for End-stage osteoarthrosis, valgus deformity of the right knee.Status post Right total knee arthroplasty on 03/20/16. 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  [facility] Care Management preoperative follow up completed on 04/23/17.    Assessment: Received UMRPreoperative /Transition of care referral on11/26/18.Preoperative call completed, and Transition of care follow up completed, no care management needs,  issue follow up completed regarding outpatient therapy and home health.         Plan: Case will remain closed.     [name]. Annia Friendly, BSN, [place] Management [facility] Telephonic CM Phone: [phone] Fax: [phone]

## 2017-05-01 NOTE — Patient Outreach (Signed)
[facility]) Care Management  05/01/2017  NANCI LAKATOS June 04, 1955 [id number]   Subjective: Telephone call to patient's home number, spoke with patient, and HIPAA verified.  Discussed [facility] Care Management UMR Transition of care follow up, patient voiced understanding, and is in agreement to follow up.   Patient states she is doing well, very tired, home health services have started, doing intravenous antibiotics every 12 hours independently, has a follow up appointment with surgeon on 05/07/17, and able to do home exercise program without difficulty.   States home health nurse had mentioned that she will not be able to have home health services and outpatient services at the same time.   States MD is planning to order outpatient physical therapy to start in 2 weeks and will continue to receive intravenous antibiotics for approximately 6 weeks.  RNCM advised patient that home health nurse is correct regarding home health services and outpatient services can not be render at the same time due to different levels of care.  Patient voiced understanding and will also discuss issue with surgeon during follow up appointment next week to see if he has any other suggestions.   RNCM advised patient, RNCM will also research issue to see if there are any other options and call her back with an update next week.  Patient states she is able to manage self care and has assistance as needed.   Patient voices understanding of medical diagnosis, surgery, and treatment plan. [facility] benefits discussed on 04/23/17 preoperative call and patient states no additional questions at this time.  Patient states she does not have any education material, transition of care, care coordination, disease management, disease monitoring, transportation, community resource, or pharmacy needs at this time.  States she is very appreciative of the follow up and is in agreement to receive [facility] Management information.  Telephone  call to [place] Management Assistant Clinical Director, discussed above conversation with patient, and home health / outpatient therapy issue.  States she will send email request to [facility] of Employee Benefits ([place]) and Benefit Administrator [facility] [place]) for clarification.    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   Status post Revision/reimplantation of right total knee replacement utilizing components from [employer] knee system with a size 3 TC3 femur, 4 mm distal medial augment, 8 mm distal lateral augment, 4 mm posterior medial augment and +2 bolt, 5-degree adapter with a 13 x 60 mm cemented stem  The tibial tray with a size 3 MBT revision tray with 5 mm medial and lateral augments, a size 29 cemented sleeve and a 13 x 30 cemented stem.  We used the size 15 posterior stabilized insert to match the size 3 femur and a 38 patellar button on 04/27/17. Patient hospitalized 12/12/16 - 12/19/16 for anemia.Patient hospitalized 12/02/16 -12/05/16 for Infected right total knee arthroplasty. Status post EXCISIONAL TOTAL KNEE ARTHROPLASTY WITH ANTIBIOTIC SPACERS on 12/02/16. Status post Resection of right total knee arthroplasty, placement of antibiotic articulating spacer on 12/03/16. Patient also hospitalized 03/20/16 -03/23/16 for End-stage osteoarthrosis, valgus deformity of the right knee.Status post Right total knee arthroplasty on 03/20/16. 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  [facility] Care Management preoperative follow up completed on 04/23/17.    Assessment: Received UMR  Preoperative / Transition of care referral on 04/20/17. Preoperative call completed, and Transition of care follow up completed, no care  management needs, and will proceed with case closure.      Plan:RNCM will send patient successful outreach letter, [facility] pamphlet, and magnet. RNCM will send case closure due to follow up completed / no care management needs request to [facility] [place] Management. RNCM will call patient within 1 week to discussed outcome of research regarding outpatient therapy and home health.      Oaklyn Mans H. Annia Friendly, BSN, [place] Management [facility] Telephonic CM Phone: [phone] Fax: [phone]

## 2017-05-02 DIAGNOSIS — Z7901 Long term (current) use of anticoagulants: Secondary | ICD-10-CM | POA: Diagnosis not present

## 2017-05-02 DIAGNOSIS — T8453XA Infection and inflammatory reaction due to internal right knee prosthesis, initial encounter: Secondary | ICD-10-CM | POA: Diagnosis not present

## 2017-05-02 DIAGNOSIS — Z79891 Long term (current) use of opiate analgesic: Secondary | ICD-10-CM | POA: Diagnosis not present

## 2017-05-02 DIAGNOSIS — A409 Streptococcal sepsis, unspecified: Secondary | ICD-10-CM | POA: Diagnosis not present

## 2017-05-02 DIAGNOSIS — L03115 Cellulitis of right lower limb: Secondary | ICD-10-CM | POA: Diagnosis not present

## 2017-05-02 DIAGNOSIS — Z96651 Presence of right artificial knee joint: Secondary | ICD-10-CM | POA: Diagnosis not present

## 2017-05-02 DIAGNOSIS — Z792 Long term (current) use of antibiotics: Secondary | ICD-10-CM | POA: Diagnosis not present

## 2017-05-02 DIAGNOSIS — Z791 Long term (current) use of non-steroidal anti-inflammatories (NSAID): Secondary | ICD-10-CM | POA: Diagnosis not present

## 2017-05-02 DIAGNOSIS — Z5181 Encounter for therapeutic drug level monitoring: Secondary | ICD-10-CM | POA: Diagnosis not present

## 2017-05-02 DIAGNOSIS — Z452 Encounter for adjustment and management of vascular access device: Secondary | ICD-10-CM | POA: Diagnosis not present

## 2017-05-02 LAB — AEROBIC/ANAEROBIC CULTURE (SURGICAL/DEEP WOUND): CULTURE: NO GROWTH

## 2017-05-02 LAB — AEROBIC/ANAEROBIC CULTURE W GRAM STAIN (SURGICAL/DEEP WOUND)

## 2017-05-04 MED FILL — ENOXAPARIN 80 MG/0.8 ML SYR: 80 | 30 days supply | Qty: 24 | Fill #3

## 2017-05-04 MED FILL — DARIFENACIN ER 15 MG TABLET: 15 | 30 days supply | Qty: 30 | Fill #5

## 2017-05-05 DIAGNOSIS — C792 Secondary malignant neoplasm of skin: Secondary | ICD-10-CM | POA: Diagnosis not present

## 2017-05-05 DIAGNOSIS — Z79899 Other long term (current) drug therapy: Secondary | ICD-10-CM | POA: Diagnosis not present

## 2017-05-05 DIAGNOSIS — Z923 Personal history of irradiation: Secondary | ICD-10-CM | POA: Diagnosis not present

## 2017-05-05 DIAGNOSIS — Z794 Long term (current) use of insulin: Secondary | ICD-10-CM | POA: Diagnosis not present

## 2017-05-05 DIAGNOSIS — R17 Unspecified jaundice: Secondary | ICD-10-CM | POA: Diagnosis not present

## 2017-05-05 DIAGNOSIS — Z5112 Encounter for antineoplastic immunotherapy: Secondary | ICD-10-CM | POA: Diagnosis not present

## 2017-05-05 DIAGNOSIS — R768 Other specified abnormal immunological findings in serum: Secondary | ICD-10-CM | POA: Diagnosis not present

## 2017-05-05 DIAGNOSIS — Z9013 Acquired absence of bilateral breasts and nipples: Secondary | ICD-10-CM | POA: Diagnosis not present

## 2017-05-05 DIAGNOSIS — Z9221 Personal history of antineoplastic chemotherapy: Secondary | ICD-10-CM | POA: Diagnosis not present

## 2017-05-05 DIAGNOSIS — D6481 Anemia due to antineoplastic chemotherapy: Secondary | ICD-10-CM | POA: Diagnosis not present

## 2017-05-05 DIAGNOSIS — C50412 Malignant neoplasm of upper-outer quadrant of left female breast: Secondary | ICD-10-CM | POA: Diagnosis not present

## 2017-05-05 DIAGNOSIS — Z96651 Presence of right artificial knee joint: Secondary | ICD-10-CM | POA: Diagnosis not present

## 2017-05-05 DIAGNOSIS — C50912 Malignant neoplasm of unspecified site of left female breast: Secondary | ICD-10-CM | POA: Diagnosis not present

## 2017-05-06 ENCOUNTER — Encounter: Payer: Self-pay | Admitting: Family Medicine

## 2017-05-06 ENCOUNTER — Telehealth: Payer: Self-pay | Admitting: Family Medicine

## 2017-05-06 DIAGNOSIS — Z791 Long term (current) use of non-steroidal anti-inflammatories (NSAID): Secondary | ICD-10-CM | POA: Diagnosis not present

## 2017-05-06 DIAGNOSIS — Z792 Long term (current) use of antibiotics: Secondary | ICD-10-CM | POA: Diagnosis not present

## 2017-05-06 DIAGNOSIS — Z96651 Presence of right artificial knee joint: Secondary | ICD-10-CM | POA: Diagnosis not present

## 2017-05-06 DIAGNOSIS — Z7901 Long term (current) use of anticoagulants: Secondary | ICD-10-CM | POA: Diagnosis not present

## 2017-05-06 DIAGNOSIS — T8453XA Infection and inflammatory reaction due to internal right knee prosthesis, initial encounter: Secondary | ICD-10-CM | POA: Diagnosis not present

## 2017-05-06 DIAGNOSIS — Z79891 Long term (current) use of opiate analgesic: Secondary | ICD-10-CM | POA: Diagnosis not present

## 2017-05-06 DIAGNOSIS — Z452 Encounter for adjustment and management of vascular access device: Secondary | ICD-10-CM | POA: Diagnosis not present

## 2017-05-06 DIAGNOSIS — Z5181 Encounter for therapeutic drug level monitoring: Secondary | ICD-10-CM | POA: Diagnosis not present

## 2017-05-06 NOTE — Telephone Encounter (Signed)
I am not able to write prescription for transfusion.  That would either need to come from oncology or surgery.  I'm sorry I can't be of more help

## 2017-05-06 NOTE — Telephone Encounter (Signed)
PEC Had relayed this information to patient.

## 2017-05-06 NOTE — Telephone Encounter (Signed)
Spoke with Romelle Starcher at Dr Birdie Riddle, and relayed the patient's course of events, and request; Romelle Starcher states that this is something that her office could handle, and that the ordering physicians would need to make arrangements for the pt's to have a transfusion; contacted pt by phone [phone] and relayed information per Dr Virgil Benedict office; she verbalizes understanding and states that she will contact Dr Kallie Edward to if they will write the order.

## 2017-05-06 NOTE — Telephone Encounter (Signed)
Patient calling back again at 9:30 this morning. She was stating that the physicians at [facility] were saying she needed the blood transfusion, but she is wanting to come back to [place] and have this done since it was not available at [facility].    Advised that patient would need to ask the physician  Who is ordering the blood transfusion if she could have this done here. If so, that hospital/physician would need to send the orders over   Will forward over to Dr. Birdie Riddle to verify.

## 2017-05-06 NOTE — Telephone Encounter (Signed)
  Pt called stating that she seen by Dr Birdie Riddle,  And "yesterday I was at [facility] for chemotherapy and my Hb was 7.9. I received my knee replacement last Monday at [facility]. They tried to get me2 units blood transfusions at [facility] but I have antibodies so by 6:00p no transfusion were available. Could you write an order for blood here in [place] or should I contact my surgeon here.  Thanks, [name] [phone]" (see pt e mail dated 05/06/17 at [phone]); will route high priority to [place] pool for further direction.

## 2017-05-07 ENCOUNTER — Inpatient Hospital Stay (HOSPITAL_COMMUNITY)
Admission: EM | Admit: 2017-05-07 | Discharge: 2017-05-10 | DRG: 812 | Disposition: A | Discharge status: Home / Self Care | Payer: 59 | Attending: Internal Medicine | Admitting: Internal Medicine

## 2017-05-07 ENCOUNTER — Telehealth: Payer: Self-pay

## 2017-05-07 ENCOUNTER — Emergency Department (HOSPITAL_COMMUNITY): Payer: 59

## 2017-05-07 ENCOUNTER — Encounter (HOSPITAL_COMMUNITY): Payer: Self-pay | Admitting: Emergency Medicine

## 2017-05-07 ENCOUNTER — Encounter (HOSPITAL_COMMUNITY): Payer: Self-pay | Admitting: Radiology

## 2017-05-07 ENCOUNTER — Other Ambulatory Visit: Payer: Self-pay | Admitting: Pharmacist

## 2017-05-07 DIAGNOSIS — R945 Abnormal results of liver function studies: Secondary | ICD-10-CM | POA: Diagnosis present

## 2017-05-07 DIAGNOSIS — Z9689 Presence of other specified functional implants: Secondary | ICD-10-CM | POA: Diagnosis not present

## 2017-05-07 DIAGNOSIS — Z809 Family history of malignant neoplasm, unspecified: Secondary | ICD-10-CM

## 2017-05-07 DIAGNOSIS — E559 Vitamin D deficiency, unspecified: Secondary | ICD-10-CM | POA: Diagnosis present

## 2017-05-07 DIAGNOSIS — Z853 Personal history of malignant neoplasm of breast: Secondary | ICD-10-CM | POA: Insufficient documentation

## 2017-05-07 DIAGNOSIS — R600 Localized edema: Secondary | ICD-10-CM | POA: Insufficient documentation

## 2017-05-07 DIAGNOSIS — Z9013 Acquired absence of bilateral breasts and nipples: Secondary | ICD-10-CM

## 2017-05-07 DIAGNOSIS — A419 Sepsis, unspecified organism: Secondary | ICD-10-CM | POA: Diagnosis not present

## 2017-05-07 DIAGNOSIS — C50919 Malignant neoplasm of unspecified site of unspecified female breast: Secondary | ICD-10-CM | POA: Diagnosis not present

## 2017-05-07 DIAGNOSIS — R509 Fever, unspecified: Secondary | ICD-10-CM

## 2017-05-07 DIAGNOSIS — I2510 Atherosclerotic heart disease of native coronary artery without angina pectoris: Secondary | ICD-10-CM | POA: Diagnosis present

## 2017-05-07 DIAGNOSIS — Z9889 Other specified postprocedural states: Secondary | ICD-10-CM | POA: Diagnosis not present

## 2017-05-07 DIAGNOSIS — B952 Enterococcus as the cause of diseases classified elsewhere: Secondary | ICD-10-CM | POA: Diagnosis not present

## 2017-05-07 DIAGNOSIS — A491 Streptococcal infection, unspecified site: Secondary | ICD-10-CM | POA: Diagnosis not present

## 2017-05-07 DIAGNOSIS — R7989 Other specified abnormal findings of blood chemistry: Secondary | ICD-10-CM | POA: Diagnosis present

## 2017-05-07 DIAGNOSIS — Z8249 Family history of ischemic heart disease and other diseases of the circulatory system: Secondary | ICD-10-CM | POA: Diagnosis not present

## 2017-05-07 DIAGNOSIS — Z888 Allergy status to other drugs, medicaments and biological substances status: Secondary | ICD-10-CM

## 2017-05-07 DIAGNOSIS — Z923 Personal history of irradiation: Secondary | ICD-10-CM

## 2017-05-07 DIAGNOSIS — D6481 Anemia due to antineoplastic chemotherapy: Principal | ICD-10-CM | POA: Diagnosis present

## 2017-05-07 DIAGNOSIS — Z85828 Personal history of other malignant neoplasm of skin: Secondary | ICD-10-CM

## 2017-05-07 DIAGNOSIS — Z880 Allergy status to penicillin: Secondary | ICD-10-CM

## 2017-05-07 DIAGNOSIS — Z96651 Presence of right artificial knee joint: Secondary | ICD-10-CM | POA: Diagnosis not present

## 2017-05-07 DIAGNOSIS — D649 Anemia, unspecified: Secondary | ICD-10-CM | POA: Diagnosis not present

## 2017-05-07 DIAGNOSIS — R000 Tachycardia, unspecified: Secondary | ICD-10-CM | POA: Diagnosis not present

## 2017-05-07 DIAGNOSIS — T8459XA Infection and inflammatory reaction due to other internal joint prosthesis, initial encounter: Secondary | ICD-10-CM | POA: Diagnosis not present

## 2017-05-07 DIAGNOSIS — J479 Bronchiectasis, uncomplicated: Secondary | ICD-10-CM | POA: Diagnosis present

## 2017-05-07 DIAGNOSIS — Z91048 Other nonmedicinal substance allergy status: Secondary | ICD-10-CM | POA: Diagnosis not present

## 2017-05-07 DIAGNOSIS — M009 Pyogenic arthritis, unspecified: Secondary | ICD-10-CM | POA: Diagnosis present

## 2017-05-07 DIAGNOSIS — Z820 Family history of epilepsy and other diseases of the nervous system: Secondary | ICD-10-CM

## 2017-05-07 DIAGNOSIS — T451X5A Adverse effect of antineoplastic and immunosuppressive drugs, initial encounter: Secondary | ICD-10-CM | POA: Diagnosis present

## 2017-05-07 DIAGNOSIS — R0602 Shortness of breath: Secondary | ICD-10-CM | POA: Diagnosis present

## 2017-05-07 LAB — COMPREHENSIVE METABOLIC PANEL
ALBUMIN: 3.2 g/dL — AB (ref 3.5–5.0)
ALK PHOS: 118 U/L (ref 38–126)
ALT: 21 U/L (ref 14–54)
AST: 65 U/L — AB (ref 15–41)
Anion gap: 9 (ref 5–15)
BILIRUBIN TOTAL: 2.3 mg/dL — AB (ref 0.3–1.2)
BUN: 8 mg/dL (ref 6–20)
CALCIUM: 8.9 mg/dL (ref 8.9–10.3)
CO2: 24 mmol/L (ref 22–32)
CREATININE: 0.6 mg/dL (ref 0.44–1.00)
Chloride: 99 mmol/L — ABNORMAL LOW (ref 101–111)
GFR calc Af Amer: >60 mL/min (ref >60)
GFR calc non Af Amer: >60 mL/min (ref >60)
GLUCOSE: 103 mg/dL — AB (ref 65–99)
POTASSIUM: 3.9 mmol/L (ref 3.5–5.1)
Sodium: 132 mmol/L — ABNORMAL LOW (ref 135–145)
TOTAL PROTEIN: 7 g/dL (ref 6.5–8.1)

## 2017-05-07 LAB — CBC WITH DIFFERENTIAL/PLATELET
BASOS ABS: 0 10*3/uL (ref 0.0–0.1)
Basophils Relative: 0 %
EOS ABS: 0.2 10*3/uL (ref 0.0–0.7)
EOS PCT: 4 %
HCT: 23.6 % — ABNORMAL LOW (ref 36.0–46.0)
Hemoglobin: 7.6 g/dL — ABNORMAL LOW (ref 12.0–15.0)
LYMPHS PCT: 12 %
Lymphs Abs: 0.7 10*3/uL (ref 0.7–4.0)
MCH: 32.5 pg (ref 26.0–34.0)
MCHC: 32.2 g/dL (ref 30.0–36.0)
MCV: 100.9 fL — AB (ref 78.0–100.0)
MONO ABS: 0.3 10*3/uL (ref 0.1–1.0)
Monocytes Relative: 5 %
Neutro Abs: 4.4 10*3/uL (ref 1.7–7.7)
Neutrophils Relative %: 79 %
PLATELETS: 155 10*3/uL (ref 150–400)
RBC: 2.34 MIL/uL — ABNORMAL LOW (ref 3.87–5.11)
RDW: 20.2 % — AB (ref 11.5–15.5)
WBC: 5.6 10*3/uL (ref 4.0–10.5)

## 2017-05-07 LAB — URINALYSIS, ROUTINE W REFLEX MICROSCOPIC
Bacteria, UA: NONE SEEN
Bilirubin Urine: NEGATIVE
GLUCOSE, UA: NEGATIVE mg/dL
Hgb urine dipstick: NEGATIVE
Ketones, ur: NEGATIVE mg/dL
Nitrite: NEGATIVE
PROTEIN: NEGATIVE mg/dL
Specific Gravity, Urine: 1.016 (ref 1.005–1.030)
pH: 5 (ref 5.0–8.0)

## 2017-05-07 LAB — I-STAT CG4 LACTIC ACID, ED: Lactic Acid, Venous: 1.73 mmol/L (ref 0.5–1.9)

## 2017-05-07 MED ORDER — VANCOMYCIN HCL IN DEXTROSE 750-5 MG/150ML-% IV SOLN
750.0000 mg | Freq: Two times a day (BID) | INTRAVENOUS | Status: DC
  Administered 2017-05-08 – 2017-05-10 (×6): 750 mg via INTRAVENOUS
  Filled 2017-05-07 (×8): qty 150

## 2017-05-07 MED ORDER — SODIUM CHLORIDE 0.9 % IV SOLN
Freq: Once | INTRAVENOUS | Status: AC
  Administered 2017-05-07: 22:00:00 via INTRAVENOUS

## 2017-05-07 MED ORDER — SODIUM CHLORIDE 0.9% FLUSH
10.0000 mL | INTRAVENOUS | Status: DC | PRN
  Administered 2017-05-08 (×2): 10 mL
  Filled 2017-05-07 (×2): qty 40

## 2017-05-07 MED ORDER — OXYCODONE HCL 5 MG PO TABS
10.0000 mg | ORAL_TABLET | Freq: Once | ORAL | Status: AC
  Administered 2017-05-07: 10 mg via ORAL
  Filled 2017-05-07: qty 2

## 2017-05-07 MED ORDER — ADULT MULTIVITAMIN W/MINERALS CH
1.0000 | ORAL_TABLET | Freq: Every day | ORAL | Status: DC
  Administered 2017-05-08 – 2017-05-10 (×3): 1 via ORAL
  Filled 2017-05-07 (×3): qty 1

## 2017-05-07 MED ORDER — METHOCARBAMOL 500 MG PO TABS: 500.0000 mg | ORAL_TABLET | Freq: Four times a day (QID) | ORAL | Status: DC | PRN

## 2017-05-07 MED ORDER — ONDANSETRON HCL 4 MG PO TABS: 4.0000 mg | ORAL_TABLET | Freq: Four times a day (QID) | ORAL | Status: DC | PRN

## 2017-05-07 MED ORDER — SODIUM CHLORIDE 0.9 % IV BOLUS (SEPSIS)
1000.0000 mL | Freq: Once | INTRAVENOUS | Status: AC
  Administered 2017-05-07: 1000 mL via INTRAVENOUS

## 2017-05-07 MED ORDER — ZOLPIDEM TARTRATE 5 MG PO TABS: 5.0000 mg | ORAL_TABLET | Freq: Every evening | ORAL | Status: DC | PRN

## 2017-05-07 MED ORDER — SODIUM CHLORIDE 0.9% FLUSH: 10.0000 mL | Freq: Two times a day (BID) | INTRAVENOUS | Status: DC

## 2017-05-07 MED ORDER — OXYCODONE HCL 5 MG PO TABS
5.0000 mg | ORAL_TABLET | ORAL | Status: DC | PRN
  Administered 2017-05-08 – 2017-05-10 (×10): 10 mg via ORAL
  Filled 2017-05-07 (×10): qty 2

## 2017-05-07 MED ORDER — IOPAMIDOL (ISOVUE-370) INJECTION 76%
INTRAVENOUS | Status: AC
  Filled 2017-05-07: qty 100

## 2017-05-07 MED ORDER — DEXTROSE 5 % IV SOLN
2.0000 g | Freq: Once | INTRAVENOUS | Status: AC
  Administered 2017-05-08: 2 g via INTRAVENOUS
  Filled 2017-05-07: qty 2

## 2017-05-07 MED ORDER — DOCUSATE SODIUM 100 MG PO CAPS
100.0000 mg | ORAL_CAPSULE | Freq: Two times a day (BID) | ORAL | Status: DC
  Administered 2017-05-08 – 2017-05-10 (×6): 100 mg via ORAL
  Filled 2017-05-07 (×6): qty 1

## 2017-05-07 MED ORDER — DARIFENACIN HYDROBROMIDE ER 15 MG PO TB24
15.0000 mg | ORAL_TABLET | Freq: Every day | ORAL | Status: DC
  Administered 2017-05-08 – 2017-05-10 (×2): 15 mg via ORAL
  Filled 2017-05-07 (×3): qty 1

## 2017-05-07 MED ORDER — DEXTROSE 5 % IV SOLN
1.0000 g | Freq: Three times a day (TID) | INTRAVENOUS | Status: DC
  Administered 2017-05-08 – 2017-05-10 (×7): 1 g via INTRAVENOUS
  Filled 2017-05-07 (×9): qty 1

## 2017-05-07 MED ORDER — VITAMIN D 1000 UNITS PO TABS
2000.0000 [IU] | ORAL_TABLET | ORAL | Status: DC
  Administered 2017-05-09 – 2017-05-10 (×2): 2000 [IU] via ORAL
  Filled 2017-05-07 (×2): qty 2

## 2017-05-07 MED ORDER — POLYETHYLENE GLYCOL 3350 17 G PO PACK
17.0000 g | PACK | Freq: Two times a day (BID) | ORAL | Status: DC
  Administered 2017-05-08 – 2017-05-10 (×4): 17 g via ORAL
  Filled 2017-05-07 (×6): qty 1

## 2017-05-07 MED ORDER — LEVALBUTEROL HCL 1.25 MG/0.5ML IN NEBU: 1.2500 mg | INHALATION_SOLUTION | Freq: Four times a day (QID) | RESPIRATORY_TRACT | Status: DC

## 2017-05-07 MED ORDER — IOPAMIDOL (ISOVUE-370) INJECTION 76%
100.0000 mL | Freq: Once | INTRAVENOUS | Status: AC | PRN
  Administered 2017-05-07: 100 mL via INTRAVENOUS

## 2017-05-07 MED ORDER — LORATADINE 10 MG PO TABS
10.0000 mg | ORAL_TABLET | Freq: Every day | ORAL | Status: DC
  Administered 2017-05-08 – 2017-05-10 (×3): 10 mg via ORAL
  Filled 2017-05-07 (×3): qty 1

## 2017-05-07 MED ORDER — ENOXAPARIN SODIUM 80 MG/0.8ML ~~LOC~~ SOLN
70.0000 mg | SUBCUTANEOUS | Status: DC
  Administered 2017-05-08 – 2017-05-09 (×2): 70 mg via SUBCUTANEOUS
  Filled 2017-05-07 (×2): qty 0.8

## 2017-05-07 MED ORDER — VANCOMYCIN IV (FOR PTA / DISCHARGE USE ONLY): 750.0000 mg | Freq: Two times a day (BID) | INTRAVENOUS | Status: DC

## 2017-05-07 MED ORDER — SODIUM CHLORIDE 0.9 % IV BOLUS (SEPSIS): 500.0000 mL | Freq: Once | INTRAVENOUS | Status: DC

## 2017-05-07 MED ORDER — GABAPENTIN 300 MG PO CAPS
300.0000 mg | ORAL_CAPSULE | Freq: Three times a day (TID) | ORAL | Status: DC
  Administered 2017-05-08 – 2017-05-10 (×8): 300 mg via ORAL
  Filled 2017-05-07 (×8): qty 1

## 2017-05-07 MED ORDER — ACETAMINOPHEN 325 MG PO TABS
650.0000 mg | ORAL_TABLET | Freq: Once | ORAL | Status: AC
  Administered 2017-05-07: 650 mg via ORAL
  Filled 2017-05-07: qty 2

## 2017-05-07 MED ORDER — ONDANSETRON HCL 4 MG/2ML IJ SOLN: 4.0000 mg | Freq: Four times a day (QID) | INTRAMUSCULAR | Status: DC | PRN

## 2017-05-07 MED ORDER — IBUPROFEN 200 MG PO TABS
200.0000 mg | ORAL_TABLET | Freq: Four times a day (QID) | ORAL | Status: DC | PRN
  Administered 2017-05-08 – 2017-05-09 (×3): 200 mg via ORAL
  Filled 2017-05-07 (×3): qty 1

## 2017-05-07 NOTE — ED Notes (Signed)
Writer went to obtain pending labs but Scientist, research (medical) she would obtain labs from IV.

## 2017-05-07 NOTE — Progress Notes (Addendum)
Pharmacy Antibiotic Note  [name] is a 61 y.o. 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  Pharmacy has been consulted for vancomycin and cefepime dosing. She is on Vanc PTA for enterococcus PJI via [place] services. Now adding cefepime for unknown source of possible sepsis. Vanc 750 q12 started 12/4 with anticipated end date 06/09/2017 12/3: reimplantation of right TKA WBC WNL, Cr 0.6. LA WNL, temp 99.7  Plan: Cefepime 2 gm x 1 then cefepime 1 gm IV q8h Vancomycin 750 mg IV every 12 hours.  Goal trough 15-20 mcg/mL.  F/u renal function, temp, WBC Vancomycin troughs weekly  Height: 5\' 7"  (170.2 cm) Weight: 180 lb 12.4 oz (82 kg) IBW/kg (Calculated) : 61.6  Temp (24hrs), Avg:99.7 F (37.6 C), Min:99.7 F (37.6 C), Max:99.7 F (37.6 C)  Recent Labs  Lab 05/07/17 1756 05/07/17 1805  WBC 5.6  --   CREATININE 0.60  --   LATICACIDVEN  --  1.73    Estimated Creatinine Clearance: 82.4 mL/min (by C-G formula based on SCr of 0.6 mg/dL).    Allergies  Allergen Reactions  . Penicillins Hives, Itching and Rash    Has patient had a PCN reaction causing immediate rash, facial/tongue/throat swelling, SOB or lightheadedness with hypotension: yes Has patient had a PCN reaction causing severe rash involving mucus membranes or skin necrosis: No Has patient had a PCN reaction that required hospitalization No Has patient had a PCN reaction occurring within the last 10 years: yes If all of the above answers are "NO", then may proceed with Cephalosporin use.   Denies airway involvement   . Other Rash    STERI STRIPS - Blisters  . Tape Hives    Can tolerate paper tape   Thank you for allowing pharmacy to be a part of this patient's care.  Eudelia Bunch, Pharm.D. [phone] 05/07/2017 9:09 PM

## 2017-05-07 NOTE — H&P (Addendum)
History and Physical    [name] EHM:[id number] DOB: May 18, 1956 DOA: 05/07/2017  Referring MD/NP/PA:   PCP: Laura Minium, MD   Patient coming from:  The patient is coming from home.  At baseline, pt is independent for most of ADL.  Chief Complaint: SOB and fever  HPI: [name] is a 61 y.o. 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  Pt states that she has been have SOB recently. She states that her hemoglobin normally is around 11. She states that her oncologist planned to transfuse her with 2 units of blood because her hemoglobin dropped to near 7.0, but not done yet since patient has multiple RBC antibodies. Patient denies chest pain, cough. She states that her shortness of breath is aggravated by exertion. Not tenderness in calf areas. She states that she has fever of 100.2 and chills today.   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 She had hardware removal in July of this year and with prolonged antibiotics. She underwent re-do right TKA by Dr. Alvan Dame on 12/3 with initiation of peri-operative Vancomycin starting 11/30, and PIC line placement 12/6 with continued BID vancomycin, planned for 6 weeks. She states the the pain in her right knee has improved, but still has swelling. Patient does not have nausea, vomiting, diarrhea or abdominal pain. No symptoms of UTI. Patient was seen by orthopedic surgeon today. Due to SOB, she was sent to ED for further evaluation and treatment.  ED Course: pt was found to have wBC 5.6, lactic acid 1.73, urinalysis with trace amount of leukocyte, electrolytes renal function okay, temperature 99.7, tachycardia, tachypnea, negative chest x-ray. His patient is admitted to telemetry bed as inpatient. ED physician will contact orthopedic  surgeon.  CT angiogram for chest: 1. No evidence of central pulmonary embolism. 2. Mild dependent atelectasis in the lower lobes. No acute cardiopulmonary disease otherwise. Progressive scarring and bronchiectasis involving the left upper lobe, likely related to the prior radiation therapy at the time of breast cancer treatment. 3. Cardiomegaly with right atrial enlargement in particular. 4. Possible pulmonary arterial hypertension, as the main pulmonary trunk is mildly enlarged at 4.0 cm diameter. 5. Mild LAD coronary artery atherosclerosis. No visible aortic atherosclerosis. 6. Possible splenomegaly, though the spleen is incompletely imaged.  Review of Systems:   General: has fevers, chills, has poor appetite, has fatigue HEENT: no blurry vision, hearing changes or sore throat Respiratory: has dyspnea, no coughing, no wheezing CV: no chest pain, no palpitations GI: no nausea, vomiting, abdominal pain, diarrhea, constipation GU: no dysuria, burning on urination, increased urinary frequency, hematuria  Ext: has leg edema Neuro: no unilateral weakness, numbness, or tingling, no vision change or hearing loss Skin: no rash, no skin tear. MSK: has right knee swelling and pain. Heme: No easy bruising.  Travel history: No recent long distant travel.  Allergy:  Allergies  Allergen Reactions  . Penicillins Hives, Itching and Rash    Has patient had a PCN reaction causing immediate rash, facial/tongue/throat swelling, SOB or lightheadedness with hypotension: yes Has patient had a PCN reaction causing severe rash involving mucus membranes or skin necrosis: No Has patient had a PCN reaction that required hospitalization No Has patient had a PCN reaction occurring within the last 10 years: yes If all of the above answers are "NO", then may proceed with  Cephalosporin use.   Denies airway involvement   . Other Rash    STERI STRIPS - Blisters  . Tape Hives    Can tolerate paper tape     Past Medical History:  Diagnosis Date  . 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 Allergic reaction 07/24/2016  . Anemia    needed transfusion spring of 2018  . Arthritis KNEES   right knee, hx. past left knee replacemnt.  . Blood clot in vein    around Portacath right chest-tx. Eliquis about a yr., prior Lovenox.  . Blood dyscrasia   . Chronic anticoagulation HX  PORT-A-CATH CLOT   2010 - HAS BEEN ON LOVENOX SINCE THE CLOT  . Drug rash 07/24/2016  . Elevated blood pressure reading without diagnosis of hypertension    PT MONITORS HER B/P AT HOME  . Heart murmur   . 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 PONV (postoperative nausea and vomiting)    ponv likes scopolamine patch  . Port-A-Cath in place    RIGHT CHEST   . Skin cancer of anterior chest BREAST CANCER PRIMARY W/ METS TO CHEST WALL SKIN CANCER--  CHEMOTHERAPY EVERY 3 WEEKS AT [facility]   left 9 yrs ago, chemo every 3 weeks at [facility], last chemo july 3rd  . Skin changes related to chemotherapy    per patient she has scabbed over skin circumventing port to r ight upper chest ; denie drainage and state it is due to chemptherapy   . Status post skin flap graft    right chest wall with metastatis right anterior chest -no drainage or open wound.    Past Surgical History:  Procedure Laterality Date  . APPLICATION OF A-CELL OF BACK Right 07/31/2016   Procedure: CELLERATE COLLAGEN PLACEMENT;  Surgeon: Loel Lofty Dillingham, DO;  Location: [facility];  Service: Plastics;  Laterality: Right;  . CHEST WALL TUMOR EXCISION     right  . EXCISIONAL TOTAL KNEE ARTHROPLASTY WITH ANTIBIOTIC SPACERS Right 12/02/2016   Procedure: EXCISIONAL TOTAL KNEE ARTHROPLASTY WITH ANTIBIOTIC SPACERS;  Surgeon: Paralee Cancel, MD;  Location: [facility];  Service: Orthopedics;  Laterality: Right;  90 mins  . hemotoma     evacuation left  chest wall  . I&D KNEE WITH POLY EXCHANGE Right 05/28/2016   Procedure: IRRIGATION AND DEBRIDEMENT KNEE WOUND VAC PLACMENT;  Surgeon: Susa Day, MD;  Location: [facility];  Service: Orthopedics;  Laterality: Right;  . I&D KNEE WITH POLY EXCHANGE Right 05/30/2016   Procedure: RADICAL SYNOVECTOMY,IRRIGATION AND DEBRIDEMENT KNEE WITH POLY EXCHANGE WITH ANTIBIOTIC BEADS, APPLICATION OF WOUND VAC;  Surgeon: Susa Day, MD;  Location: [facility];  Service: Orthopedics;  Laterality: Right;  . INCISION AND DRAINAGE OF WOUND Right 07/31/2016   Procedure: IRRIGATION AND DEBRIDEMENT RIGHT KNEE  WOUND;  Surgeon: Loel Lofty Dillingham, DO;  Location: [facility];  Service: Plastics;  Laterality: Right;  . IR FLUORO GUIDE CV LINE LEFT  04/30/2017  . IR US GUIDE VASC ACCESS LEFT  04/30/2017  . IRRIGATION AND DEBRIDEMENT KNEE Right 04/12/2016   Procedure: IRRIGATION AND DEBRIDEMENT KNEE;  Surgeon: Nicholes Stairs, MD;  Location: [facility];  Service: Orthopedics;  Laterality: Right;  . IRRIGATION AND DEBRIDEMENT KNEE Right 12/16/2016   Procedure: Repeat irrigation and debridement right knee, wound closure  wound vac REPLACEMENT ANTIBIOTIC SPACERS;  Surgeon: Paralee Cancel,  MD;  Location: [facility];  Service: Orthopedics;  Laterality: Right;  . KNEE ARTHROSCOPY  02/13/2012   Procedure: ARTHROSCOPY KNEE;  Surgeon: Johnn Hai, MD;  Location: [facility];  Service: Orthopedics;  Laterality: Left;  WITH DEBRIDEMENt   . KNEE ARTHROSCOPY WITH LATERAL MENISECTOMY  02/13/2012   Procedure: KNEE ARTHROSCOPY WITH LATERAL MENISECTOMY;  Surgeon: Johnn Hai, MD;  Location: [facility];  Service: Orthopedics;;  partial  . LEFT MODIFIED RADICAL MASTECTOMY/ RIGHT TOTAL MASTECTOMY  11-13-2007   LEFT BREAST CANCER W/ AXILLARY LYMPH NODE METASTASIS AND POST NEOADJUVANT CHEMO  . MASTECTOMY     Bilateral  . PLACEMENT PORT-A-CATH  06/22/2007   right chest new port placed 2015, right chest  . REIMPLANTATION OF TOTAL  KNEE Right 04/27/2017   Procedure: Reimplantation of right total knee arthroplasty;  Surgeon: Paralee Cancel, MD;  Location: [facility];  Service: Orthopedics;  Laterality: Right;  Adductor Block  . SKIN GRAFT     chest-post tumor removal, second occurrence of cancer" 2015 surgery for Flap 2015"  . SKIN SPLIT GRAFT Right 01/29/2017   Procedure: SKIN GRAFT SPLIT THICKNESS TO RIGHT KNEE WOUND;  Surgeon: Wallace Going, DO;  Location: [facility];  Service: Plastics;  Laterality: Right;  . TOTAL KNEE ARTHROPLASTY Left 09/23/2012   Procedure: LEFT TOTAL KNEE ARTHROPLASTY;  Surgeon: Johnn Hai, MD;  Location[facility];  Service: Orthopedics;  Laterality: Left;  . TOTAL KNEE ARTHROPLASTY Right 03/20/2016   Procedure: RIGHT TOTAL KNEE ARTHROPLASTY;  Surgeon: Susa Day, MD;  Location[facility];  Service: Orthopedics;  Laterality: Right;  . TRANSTHORACIC ECHOCARDIOGRAM  11-18-2011  DR BENSIMHON (ECHO EVERY 3 MONTHS)   HX CHEMO INDUCED CARDIOTOXICITY/  LVSF NORMAL/ EF 55-50%/ MILD MITRIAL VALVE REGURG./ MILDLY INCREASED SYSTOLIC PRESSURE OF PULMONARY ARTERIES  . VASCULAR SURGERY Right    portacath chest    Social History:  reports that  has never smoked. she has never used smokeless tobacco. She reports that she does not drink alcohol or use drugs.  Family History:  Family History  Problem Relation Age of Onset  . Heart disease Mother        due to mitral valve regurgiation,   . Cancer Mother        breast  . Allergic rhinitis Mother   . Parkinsonism Father   . Allergic rhinitis Father   . Migraines Father   . Alcohol abuse Paternal Grandfather   . Angioedema Neg Hx   . Asthma Neg Hx   . Eczema Neg Hx      Prior to Admission medications   Medication Sig Start Date End Date Taking? Authorizing Provider  Ado-Trastuzumab Emtansine (KADCYLA IV) Inject into the vein every 21 ( twenty-one) days. At [facility] next treatment due 02-10-17   Yes [provider]  cetirizine (ZYRTEC) 10 MG tablet Take  1 tablet (10 mg total) daily by mouth. 04/03/17  Yes Laura Minium, MD  Cholecalciferol (VITAMIN D3) 2000 UNITS TABS Take 1 tablet by mouth 4 (four) times a week.    Yes [provider]  darifenacin (ENABLEX) 15 MG 24 hr tablet Take 15 mg by mouth daily. 05/04/17  Yes [provider]  docusate sodium (COLACE) 100 MG capsule Take 1 capsule (100 mg total) by mouth 2 (two) times daily. 04/27/17  Yes Babish, Rodman Key, PA-C  enoxaparin (LOVENOX) 80 MG/0.8ML injection Inject 0.4 mLs (40 mg total) into the skin daily. 04/30/17  Yes Paralee Cancel, MD  gabapentin (  NEURONTIN) 300 MG capsule TAKE 1 CAPSULE BY MOUTH 3 TIMES DAILY 02/02/17  Yes Laura Minium, MD  methocarbamol (ROBAXIN) 500 MG tablet Take 1 tablet (500 mg total) by mouth every 6 (six) hours as needed for muscle spasms. 04/27/17  Yes Babish, Rodman Key, PA-C  Multiple Vitamin (MULTIVITAMIN WITH MINERALS) TABS tablet Take 1 tablet by mouth daily.   Yes [provider]  ondansetron (ZOFRAN) 8 MG tablet Take 1 tablet (8 mg total) by mouth every 8 (eight) hours as needed for nausea. Patient taking differently: Take 8 mg by mouth every 8 (eight) hours as needed for nausea or vomiting. Uses after chemo treatments 05/23/16  Yes Laura Minium, MD  oxyCODONE (OXY IR/ROXICODONE) 5 MG immediate release tablet Take 1-3 tablets (5-15 mg total) by mouth every 4 (four) hours as needed for severe pain. 04/27/17  Yes Babish, Rodman Key, PA-C  polyethylene glycol (MIRALAX / GLYCOLAX) packet Take 17 g by mouth 2 (two) times daily. 04/27/17  Yes Babish, Rodman Key, PA-C  vancomycin IVPB Inject 750 mg into the vein every 12 (twelve) hours. Indication:  Prosthetic joint infection Last Day of Therapy:  06/09/17 Labs - Thursdays:  CBC/D and vancomycin trough Labs - Monday/Thursday:  BMP 04/29/17 06/09/17 Yes Paralee Cancel, MD  celecoxib (CELEBREX) 200 MG capsule Take 1 capsule (200 mg total) by mouth every 12 (twelve) hours. Patient not taking:  Reported on 05/01/2017 04/30/17   Paralee Cancel, MD  docusate sodium (COLACE) 100 MG capsule Take 1 capsule (100 mg total) by mouth 2 (two) times daily. Patient not taking: Reported on 05/07/2017 04/30/17   Paralee Cancel, MD  ferrous sulfate (FERROUSUL) 325 (65 FE) MG tablet Take 1 tablet (325 mg total) by mouth 3 (three) times daily with meals. Patient not taking: Reported on 05/07/2017 04/27/17   Danae Orleans, PA-C  furosemide (LASIX) 20 MG tablet Take 1 tablet (20 mg total) by mouth daily as needed for edema. Patient not taking: Reported on 05/01/2017 04/30/17   Paralee Cancel, MD  zolpidem (AMBIEN) 5 MG tablet TAKE 1 TABLET BY MOUTH ONCE DAILY AT BEDTIME AS NEEDED FOR SLEEP Patient not taking: Reported on 05/07/2017 10/02/16   Laura Minium, MD    Physical Exam: Vitals:   05/07/17 1737 05/07/17 1800 05/07/17 1830 05/07/17 1900  BP: (!) 110/58 118/61 100/60 109/62  Pulse: (!) 110 (!) 107 (!) 103 (!) 102  Resp: (!) 28 (!) 23 (!) 23 20  Temp:      TempSrc:      SpO2: 98% 96% 97% 98%  Weight:      Height:       General: Not in acute distress HEENT:       Eyes: PERRL, EOMI, no scleral icterus.       ENT: No discharge from the ears and nose, no pharynx injection, no tonsillar enlargement.        Neck: No JVD, no bruit, no mass felt. Heme: No neck lymph node enlargement. Cardiac: S1/S2, RRR, No murmurs, No gallops or rubs. Respiratory: No rales, wheezing, rhonchi or rubs. GI: Soft, nondistended, nontender, no rebound pain, no organomegaly, BS present. GU: No hematuria Ext: has pitting leg edema bilaterally (2+ in left leg and 3+ in right leg). 2+DP/PT pulse bilaterally. Musculoskeletal: s/p of right surgery, has ecchymosis and tenderness, slightly warm in the medial side of right knee. Skin: No rashes.  Neuro: Alert, oriented X3, cranial nerves II-XII grossly intact, moves all extremities normally. Psych: Patient is not psychotic, no suicidal or  hemocidal ideation.  Labs on  Admission: I have personally reviewed following labs and imaging studies  CBC: Recent Labs  Lab 05/07/17 1756  WBC 5.6  NEUTROABS 4.4  HGB 7.6*  HCT 23.6*  MCV 100.9*  PLT 735   Basic Metabolic Panel: Recent Labs  Lab 05/07/17 1756  NA 132*  K 3.9  CL 99*  CO2 24  GLUCOSE 103*  BUN 8  CREATININE 0.60  CALCIUM 8.9   GFR: Estimated Creatinine Clearance: 82.4 mL/min (by C-G formula based on SCr of 0.6 mg/dL). Liver Function Tests: Recent Labs  Lab 05/07/17 1756  AST 65*  ALT 21  ALKPHOS 118  BILITOT 2.3*  PROT 7.0  ALBUMIN 3.2*   No results for input(s): LIPASE, AMYLASE in the last 168 hours. No results for input(s): AMMONIA in the last 168 hours. Coagulation Profile: No results for input(s): INR, PROTIME in the last 168 hours. Cardiac Enzymes: No results for input(s): CKTOTAL, CKMB, CKMBINDEX, TROPONINI in the last 168 hours. BNP (last 3 results) No results for input(s): PROBNP in the last 8760 hours. HbA1C: No results for input(s): HGBA1C in the last 72 hours. CBG: No results for input(s): GLUCAP in the last 168 hours. Lipid Profile: No results for input(s): CHOL, HDL, LDLCALC, TRIG, CHOLHDL, LDLDIRECT in the last 72 hours. Thyroid Function Tests: No results for input(s): TSH, T4TOTAL, FREET4, T3FREE, THYROIDAB in the last 72 hours. Anemia Panel: No results for input(s): VITAMINB12, FOLATE, FERRITIN, TIBC, IRON, RETICCTPCT in the last 72 hours. Urine analysis:    Component Value Date/Time   COLORURINE YELLOW 05/07/2017 1721   APPEARANCEUR HAZY (A) 05/07/2017 1721   LABSPEC 1.016 05/07/2017 1721   LABSPEC 1.005 02/17/2008 1122   PHURINE 5.0 05/07/2017 1721   GLUCOSEU NEGATIVE 05/07/2017 1721   HGBUR NEGATIVE 05/07/2017 1721   HGBUR trace-intact 11/22/2010 0835   BILIRUBINUR NEGATIVE 05/07/2017 1721   BILIRUBINUR negative 02/25/2012 0859   BILIRUBINUR n 09/19/2011 1505   BILIRUBINUR Negative 02/17/2008 1122   KETONESUR NEGATIVE 05/07/2017 1721    PROTEINUR NEGATIVE 05/07/2017 1721   UROBILINOGEN 1.0 02/01/2015 1654   NITRITE NEGATIVE 05/07/2017 1721   LEUKOCYTESUR TRACE (A) 05/07/2017 1721   LEUKOCYTESUR Moderate 02/17/2008 1122   Sepsis Labs: _0 (procalcitonin:4,lacticidven:4) )No results found for this or any previous visit (from the past 240 hour(s)).   Radiological Exams on Admission: Dg Chest 2 View  Result Date: 05/07/2017 CLINICAL DATA:  Shortness of breath beginning today. Currently undergoing chemotherapy for breast carcinoma. EXAM: CHEST  2 VIEW COMPARISON:  12/12/2016 and 05/10/2016 FINDINGS: New left arm PICC line is seen with tip overlying the superior cavoatrial junction. Right-sided power port remains in appropriate position. Mild cardiomegaly is stable. No evidence of pulmonary infiltrate or pleural effusion. Elevation of left hilum is again seen with dilatation of proximal pulmonary arteries. These findings are stable and suspicious for pulmonary arterial hypertension. Patient has undergone previous bilateral mastectomies with axillary lymph node dissections. IMPRESSION: No acute findings. Stable mild cardiomegaly, and dilatation of proximal pulmonary arteries suspicious for pulmonary arterial hypertension. Electronically Signed   By: Earle Gell M.D.   On: 05/07/2017 18:10   Ct Angio Chest Pe W And/or Wo Contrast  Result Date: 05/07/2017 CLINICAL DATA:  Three-day history of shortness of breath on exertion, tachypnea and fever. Patient underwent right total knee arthroplasty on 04/27/2017. Personal history of left breast cancer in 2009. EXAM: CT ANGIOGRAPHY CHEST WITH CONTRAST TECHNIQUE: Multidetector CT imaging of the chest was performed using the standard protocol during bolus  administration of intravenous contrast. Multiplanar CT image reconstructions and MIPs were obtained to evaluate the vascular anatomy. CONTRAST:  122m ISOVUE-370 IOPAMIDOL INJECTION 76% IV. COMPARISON:  No prior CTA chest.  PET-CT 05/30/2008  is correlated. FINDINGS: Cardiovascular: Contrast opacification of the pulmonary arteries is very good. Respiratory motion blurred images of the mid lungs and the lung bases. Overall, the study is of moderate diagnostic quality. No filling defects within either main pulmonary artery or their proximal segmental branches in either lung to suggest pulmonary embolism. Main pulmonary trunk mildly enlarged at approximately 4.0 cm diameter. Heart moderately enlarged with right atrial enlargement in particular. Mild LAD coronary atherosclerosis. No significant pericardial effusion. No visible aortic atherosclerosis. Right subclavian Port-A-Cath and left arm PICC have their tips in the lower SVC at or near the cavoatrial junction. Mediastinum/Nodes: No pathologically enlarged mediastinal, hilar or axillary lymph nodes. Prior left axillary node dissection. No mediastinal masses. Normal-appearing esophagus. Thyroid gland normal in appearance. Lungs/Pleura: Scar and bronchiectasis involving the left upper lobe anteriorly with associated marked volume loss, progressive since the prior PET-CT in 2010. Mild dependent atelectasis posteriorly in the lower lobes. Lungs otherwise clear. No confluent airspace consolidation. No evidence of interstitial lung disease. Central airways patent without significant bronchial wall thickening. No pleural effusions. Upper Abdomen: Possible splenomegaly, though the spleen is completely imaged. Visualized upper abdomen otherwise unremarkable for the early arterial phase of enhancement. Musculoskeletal: Exaggeration of the usual thoracic kyphosis. Degenerative disc disease and spondylosis involving the mid and lower thoracic spine. No acute osseous abnormalities. Other: Prior left mastectomy. No evidence of recurrent tumor in the left chest wall. Review of the MIP images confirms the above findings. IMPRESSION: 1. No evidence of central pulmonary embolism. 2. Mild dependent atelectasis in the lower  lobes. No acute cardiopulmonary disease otherwise. Progressive scarring and bronchiectasis involving the left upper lobe, likely related to the prior radiation therapy at the time of breast cancer treatment. 3. Cardiomegaly with right atrial enlargement in particular. 4. Possible pulmonary arterial hypertension, as the main pulmonary trunk is mildly enlarged at 4.0 cm diameter. 5. Mild LAD coronary artery atherosclerosis. No visible aortic atherosclerosis. 6. Possible splenomegaly, though the spleen is incompletely imaged. Electronically Signed   By: TEvangeline DakinM.D.   On: 05/07/2017 20:05     EKG: Independently reviewed.  Sinus rhythm, tachycardia, QTC 411, low voltage, RAD, occasional PVC  Assessment/Plan Principal Problem:   Symptomatic anemia Active Problems:   Metastatic breast cancer (HCC)   SOB (shortness of breath)   Sepsis (HCC)   S/P total knee arthroplasty, right   Pyogenic arthritis of right knee joint (HCC)   Enterococcal infection   Anemia due to chemotherapy   Abnormal LFTs   Symptomatic anemia, anemia due to chemotherapy and SOB: Patient's shortness breath is likely due to symptomatic anemia. Her baseline hemoglobin is around 11 per patient, which dropped to 7.6 today. Potential differential diagnosis is dCHF. Patient had 2-D echo on 04/24/17 which showed EF of 65-70 percent with grade 1 diastolic dysfunction. Patient received 2 L normal saline bolus in ED due to suspicions of sepsis, but her shortness of breath has improved per patient, this is against CHF. CT angiogram of chest is negative for PE and pneumonia. Patient does not have oxygen desaturation.  -will admit to tele bed as inpt -will transfuse 2 Unit of blood -prn xopenex for SOB -prn Nasal cannula oxygen -f/u BNP  Addendum: BNP comes back elevated at 801-->indicating possible dCHF exacerbation. -will give lasix 40 mg  x 1 now  Metastatic breast cancer [facility]): s/p of Bilateral mastectomy. Patient has been  managed in [place]. She is receiving chemotherapy every 3 weeks -f/u with Oncologist at [place] meets criteria for sepsis with fever, tachycardia and tachypnea. Lactic acid is normal. Currently hemodynamically stable. Patient has known) knee post procedure infection with enterococci. Not sure if pt has new infection. Since pt is already on IV Vancomycin, will add another antibiotics to broaden the coverage. -Continue IV vancomycin -Start cefepime IV for pharmacy -Follow up blood culture and urine culture -will get Procalcitonin and trend lactic acid levels per sepsis protocol. -IVF: 2L of NS bolus in ED  S/P total knee arthroplasty and pyogenic arthritis of right knee join with enterococcal infection: patient states that her pain has improved. -continue IV vancomycin (planned from 04/29/17-06/09/17). -prn oxycodone for pain -EDP will contact ortho   Abnormal LFTs: AST 65, ALT 21, ALP 118, total bilirubin 2.3. Etiology is not clear. Patient has no GI symptoms.  -Avoid Tylenol -Check hepatitis panel  Bilateral leg edema: R>L. -will get LE venous doppler to r/o DVT   DVT ppx: SQ Lovenox Code Status: Full code Family Communication:  Yes, patient's duughter  at bed side Disposition Plan:  Anticipate discharge back to previous home environment Consults called: EDP will consult to ortho  Admission status:  Inpatient/tele     Date of Service 05/07/2017    Ivor Costa [facility] Pager [phone]  If 7PM-7AM, please contact night-coverage [email or web address] Password [facility] 05/07/2017, 9:47 PM

## 2017-05-07 NOTE — ED Provider Notes (Signed)
[facility] DEPT Provider Note   CSN: [id number] Arrival date & time: 05/07/17  1551     History   Chief Complaint Chief Complaint  Patient presents with  . Shortness of Breath  . Post-op Problem    HPI [name] is a 61 y.o. female. CC: SOB, Fever, Post op revised total knee 2/2 enterococus right TKA.  HPI 61 y/o female with h/o metastatic Breast CA for 10 yrs, still on q 3 week chemotherapy, with last 1 weeks ago.  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 Had hardware removal in July of this year and prolonged antibiotics.  Underwent re-do right TKA with Salli Quarry on 12/3 with initiation of peri-operative Vancomycin starting 11/30, and PIC line placement 12/6 with continued BID vancomycin planned for 6 weeks.   With last chemo, was anemic. At day of Ctx, [facility] could not give transfusion 2/2 end of day, and patient with multiple RBC antibodies.  She remains sob with exertion. No syncope. No chest pain.  Seen by orthopedic surgeon today. Would looks well. Was SOB and tachycardic on exam and referred to ER.  On arrival, Pt temp 102.4 and HR 125.     Past Medical History:  Diagnosis Date  . 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 Allergic reaction 07/24/2016  . Anemia    needed transfusion spring of 2018  . Arthritis KNEES   right knee, hx. past left knee replacemnt.  . Blood clot in vein    around Portacath right chest-tx. Eliquis about a yr., prior Lovenox.  . Blood dyscrasia   . Chronic anticoagulation HX  PORT-A-CATH CLOT   2010 - HAS BEEN ON LOVENOX SINCE THE CLOT  . Drug rash 07/24/2016  . Elevated blood pressure reading without diagnosis of hypertension    PT MONITORS HER B/P AT HOME  . Heart murmur   . 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 PONV (postoperative  nausea and vomiting)    ponv likes scopolamine patch  . Port-A-Cath in place    RIGHT CHEST   . 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 Skin changes related to chemotherapy    per patient she has scabbed over skin circumventing port to r ight upper chest ; denie drainage and state it is due to chemptherapy   . Status post skin flap graft    right chest wall with metastatis right anterior chest -no drainage or open wound.    Patient Active Problem List   Diagnosis Date Noted  . History of breast cancer   . Enterococcal infection   . S/P revision of total knee, right 04/27/2017  . Pyogenic arthritis of right knee joint ([place])   . Acute blood loss anemia 12/12/2016  . Right knee abx spacer 12/02/2016  . Physical exam 07/31/2016  . Allergic reaction 07/24/2016  . Drug rash 07/24/2016  . S/P total knee arthroplasty, right 05/31/2016  . 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 Nonhealing surgical wound 05/28/2016  . Sepsis ([place]) 05/10/2016  . UTI (urinary tract infection) 05/10/2016  . Cellulitis of right knee 05/10/2016  . Blood  clot in vein   . Wound dehiscence, surgical 04/12/2016  . 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 Vitamin D deficiency 01/25/2016  . Metastatic cancer to chest wall ([place]) 02/02/2015  . Primary osteoarthritis of right knee 02/01/2015  . Symptomatic anemia 02/01/2015  . Frequent UTI 01/12/2014  . Sinus tachycardia 11/03/2013  . DOE (dyspnea on exertion) 11/03/2013  . Postoperative anemia due to acute blood loss 12/24/2012  .    09/23/2012  . Sinusitis 07/28/2012  . 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 Metastatic breast cancer ([place]) 11/12/2010  . Chronic anticoagulation 11/12/2010  . Anxiety, generalized 03/26/2008    Past Surgical  History:  Procedure Laterality Date  . APPLICATION OF A-CELL OF BACK Right 07/31/2016   Procedure: CELLERATE COLLAGEN PLACEMENT;  Surgeon: Loel Lofty Dillingham, DO;  Location: [facility];  Service: Plastics;  Laterality: Right;  . CHEST WALL TUMOR EXCISION     right  . EXCISIONAL TOTAL KNEE ARTHROPLASTY WITH ANTIBIOTIC SPACERS Right 12/02/2016   Procedure: EXCISIONAL TOTAL KNEE ARTHROPLASTY WITH ANTIBIOTIC SPACERS;  Surgeon: Paralee Cancel, MD;  Location: [facility];  Service: Orthopedics;  Laterality: Right;  90 mins  . hemotoma     evacuation left chest wall  . I&D KNEE WITH POLY EXCHANGE Right 05/28/2016   Procedure: IRRIGATION AND DEBRIDEMENT KNEE WOUND VAC PLACMENT;  Surgeon: Susa Day, MD;  Location: [facility];  Service: Orthopedics;  Laterality: Right;  . I&D KNEE WITH POLY EXCHANGE Right 05/30/2016   Procedure: RADICAL SYNOVECTOMY,IRRIGATION AND DEBRIDEMENT KNEE WITH POLY EXCHANGE WITH ANTIBIOTIC BEADS, APPLICATION OF WOUND VAC;  Surgeon: Susa Day, MD;  Location: [facility];  Service: Orthopedics;  Laterality: Right;  . INCISION AND DRAINAGE OF WOUND Right 07/31/2016   Procedure: IRRIGATION AND DEBRIDEMENT RIGHT KNEE  WOUND;  Surgeon: Loel Lofty Dillingham, DO;  Location: [facility];  Service: Plastics;  Laterality: Right;  . IR FLUORO GUIDE CV LINE LEFT  04/30/2017  . IR US GUIDE VASC ACCESS LEFT  04/30/2017  . IRRIGATION AND DEBRIDEMENT KNEE Right 04/12/2016   Procedure: IRRIGATION AND DEBRIDEMENT KNEE;  Surgeon: Nicholes Stairs, MD;  Location: [facility];  Service: Orthopedics;  Laterality: Right;  . IRRIGATION AND DEBRIDEMENT KNEE Right 12/16/2016   Procedure: Repeat irrigation and debridement right knee, wound closure  wound vac REPLACEMENT ANTIBIOTIC SPACERS;  Surgeon: Paralee Cancel, MD;  Location: [facility];  Service: Orthopedics;  Laterality: Right;  . KNEE ARTHROSCOPY  02/13/2012   Procedure: ARTHROSCOPY KNEE;  Surgeon: Johnn Hai, MD;  Location: [facility];  Service: Orthopedics;   Laterality: Left;  WITH DEBRIDEMENt   . KNEE ARTHROSCOPY WITH LATERAL MENISECTOMY  02/13/2012   Procedure: KNEE ARTHROSCOPY WITH LATERAL MENISECTOMY;  Surgeon: Johnn Hai, MD;  Location: [facility];  Service: Orthopedics;;  partial  . LEFT MODIFIED RADICAL MASTECTOMY/ RIGHT TOTAL MASTECTOMY  11-13-2007   LEFT BREAST CANCER W/ AXILLARY LYMPH NODE METASTASIS AND POST NEOADJUVANT CHEMO  . MASTECTOMY     Bilateral  . PLACEMENT PORT-A-CATH  06/22/2007   right chest new port placed 2015, right chest  . REIMPLANTATION OF TOTAL KNEE Right 04/27/2017   Procedure: Reimplantation of right total knee arthroplasty;  Surgeon: Paralee Cancel, MD;  [facility];  Service: Orthopedics;  Laterality: Right;  Adductor Block  . SKIN GRAFT     chest-post tumor removal, second occurrence of cancer" 2015 surgery for Flap 2015"  . SKIN SPLIT GRAFT Right 01/29/2017   Procedure: SKIN  GRAFT SPLIT THICKNESS TO RIGHT KNEE WOUND;  Surgeon: Wallace Going, DO;  Location: [facility];  Service: Plastics;  Laterality: Right;  . TOTAL KNEE ARTHROPLASTY Left 09/23/2012   Procedure: LEFT TOTAL KNEE ARTHROPLASTY;  Surgeon: Johnn Hai, MD;  [facility];  Service: Orthopedics;  Laterality: Left;  . TOTAL KNEE ARTHROPLASTY Right 03/20/2016   Procedure: RIGHT TOTAL KNEE ARTHROPLASTY;  Surgeon: Susa Day, MD;  [facility];  Service: Orthopedics;  Laterality: Right;  . TRANSTHORACIC ECHOCARDIOGRAM  11-18-2011  DR BENSIMHON (ECHO EVERY 3 MONTHS)   HX CHEMO INDUCED CARDIOTOXICITY/  LVSF NORMAL/ EF 55-50%/ MILD MITRIAL VALVE REGURG./ MILDLY INCREASED SYSTOLIC PRESSURE OF PULMONARY ARTERIES  . VASCULAR SURGERY Right    portacath chest    OB History    No data available       Home Medications    Prior to Admission medications   Medication Sig Start Date End Date Taking? Authorizing Provider  Ado-Trastuzumab Emtansine (KADCYLA IV) Inject into the vein every 21 ( twenty-one) days. At [facility] next  treatment due 02-10-17   Yes [provider]  cetirizine (ZYRTEC) 10 MG tablet Take 1 tablet (10 mg total) daily by mouth. 04/03/17  Yes Midge Minium, MD  Cholecalciferol (VITAMIN D3) 2000 UNITS TABS Take 1 tablet by mouth 4 (four) times a week.    Yes [provider]  darifenacin (ENABLEX) 15 MG 24 hr tablet Take 15 mg by mouth daily. 05/04/17  Yes [provider]  docusate sodium (COLACE) 100 MG capsule Take 1 capsule (100 mg total) by mouth 2 (two) times daily. 04/27/17  Yes Babish, Rodman Key, PA-C  enoxaparin (LOVENOX) 80 MG/0.8ML injection Inject 0.4 mLs (40 mg total) into the skin daily. 04/30/17  Yes Paralee Cancel, MD  gabapentin (NEURONTIN) 300 MG capsule TAKE 1 CAPSULE BY MOUTH 3 TIMES DAILY 02/02/17  Yes Midge Minium, MD  methocarbamol (ROBAXIN) 500 MG tablet Take 1 tablet (500 mg total) by mouth every 6 (six) hours as needed for muscle spasms. 04/27/17  Yes Babish, Rodman Key, PA-C  Multiple Vitamin (MULTIVITAMIN WITH MINERALS) TABS tablet Take 1 tablet by mouth daily.   Yes [provider]  ondansetron (ZOFRAN) 8 MG tablet Take 1 tablet (8 mg total) by mouth every 8 (eight) hours as needed for nausea. Patient taking differently: Take 8 mg by mouth every 8 (eight) hours as needed for nausea or vomiting. Uses after chemo treatments 05/23/16  Yes Midge Minium, MD  oxyCODONE (OXY IR/ROXICODONE) 5 MG immediate release tablet Take 1-3 tablets (5-15 mg total) by mouth every 4 (four) hours as needed for severe pain. 04/27/17  Yes Babish, Rodman Key, PA-C  polyethylene glycol (MIRALAX / GLYCOLAX) packet Take 17 g by mouth 2 (two) times daily. 04/27/17  Yes Babish, Rodman Key, PA-C  vancomycin IVPB Inject 750 mg into the vein every 12 (twelve) hours. Indication:  Prosthetic joint infection Last Day of Therapy:  06/09/17 Labs - Thursdays:  CBC/D and vancomycin trough Labs - Monday/Thursday:  BMP 04/29/17 06/09/17 Yes Paralee Cancel, MD  celecoxib (CELEBREX) 200 MG  capsule Take 1 capsule (200 mg total) by mouth every 12 (twelve) hours. Patient not taking: Reported on 05/01/2017 04/30/17   Paralee Cancel, MD  docusate sodium (COLACE) 100 MG capsule Take 1 capsule (100 mg total) by mouth 2 (two) times daily. Patient not taking: Reported on 05/07/2017 04/30/17   Paralee Cancel, MD  ferrous sulfate (FERROUSUL) 325 (65 FE) MG tablet Take 1 tablet (325 mg total) by  mouth 3 (three) times daily with meals. Patient not taking: Reported on 05/07/2017 04/27/17   Danae Orleans, PA-C  furosemide (LASIX) 20 MG tablet Take 1 tablet (20 mg total) by mouth daily as needed for edema. Patient not taking: Reported on 05/01/2017 04/30/17   Paralee Cancel, MD  zolpidem (AMBIEN) 5 MG tablet TAKE 1 TABLET BY MOUTH ONCE DAILY AT BEDTIME AS NEEDED FOR SLEEP Patient not taking: Reported on 05/07/2017 10/02/16   Midge Minium, MD    Family History Family History  Problem Relation Age of Onset  . Heart disease Mother        due to mitral valve regurgiation,   . Cancer Mother        breast  . Allergic rhinitis Mother   . Parkinsonism Father   . Allergic rhinitis Father   . Migraines Father   . Alcohol abuse Paternal Grandfather   . Angioedema Neg Hx   . Asthma Neg Hx   . Eczema Neg Hx     Social History Social History   Tobacco Use  . Smoking status: Never Smoker  . Smokeless tobacco: Never Used  Substance Use Topics  . Alcohol use: No  . Drug use: No     Allergies   Penicillins; Other; and Tape   Review of Systems Review of Systems  Constitutional: Positive for chills, fatigue and fever. Negative for appetite change and diaphoresis.  HENT: Negative for mouth sores, sore throat and trouble swallowing.   Eyes: Negative for visual disturbance.  Respiratory: Positive for shortness of breath. Negative for cough, chest tightness and wheezing.   Cardiovascular: Positive for leg swelling. Negative for chest pain.  Gastrointestinal: Negative for abdominal  distention, abdominal pain, diarrhea, nausea and vomiting.  Endocrine: Negative for polydipsia, polyphagia and polyuria.  Genitourinary: Negative for dysuria, frequency and hematuria.  Musculoskeletal: Positive for arthralgias. Negative for gait problem.  Skin: Negative for color change, pallor and rash.  Neurological: Negative for dizziness, syncope, light-headedness and headaches.  Hematological: Does not bruise/bleed easily.  Psychiatric/Behavioral: Negative for behavioral problems and confusion.     Physical Exam Updated Vital Signs BP 109/62   Pulse (!) 102   Temp 99.7 F (37.6 C) (Oral)   Resp 20   Ht 5' 7"  (1.702 m)   Wt 82 kg (180 lb 12.4 oz)   SpO2 98%   BMI 28.31 kg/m   Physical Exam Awake and alert VS:  Oral temp by me 102.4 HEENT: normal pharynx, conjunctiva pale.  Neck No JVD Chest: Clear BBS. Granulation tissue around upper chest and right chest port--post radiation per patient. CV: tachycardic ABD: soft, non tender GU: no cva tenderness Extremities: LLE normal RLE wound dressing intact. Errythematous, STS, eccymosis, no cording Neuro: intact PIC catheter in RUE, site appears WNL  ED Treatments / Results  Labs (all labs ordered are listed, but only abnormal results are displayed) Labs Reviewed  COMPREHENSIVE METABOLIC PANEL - Abnormal; Notable for the following components:      Result Value   Sodium 132 (*)    Chloride 99 (*)    Glucose, Bld 103 (*)    Albumin 3.2 (*)    AST 65 (*)    Total Bilirubin 2.3 (*)    All other components within normal limits  CBC WITH DIFFERENTIAL/PLATELET - Abnormal; Notable for the following components:   RBC 2.34 (*)    Hemoglobin 7.6 (*)    HCT 23.6 (*)    MCV 100.9 (*)    RDW 20.2 (*)  All other components within normal limits  URINALYSIS, ROUTINE W REFLEX MICROSCOPIC - Abnormal; Notable for the following components:   APPearance HAZY (*)    Leukocytes, UA TRACE (*)    Squamous Epithelial / LPF 6-30 (*)     Non Squamous Epithelial 0-5 (*)    All other components within normal limits  CULTURE, BLOOD (ROUTINE X 2)  CULTURE, BLOOD (ROUTINE X 2)  URINE CULTURE  I-STAT CG4 LACTIC ACID, ED  I-STAT CG4 LACTIC ACID, ED  TYPE AND SCREEN  PREPARE RBC (CROSSMATCH)    EKG  EKG Interpretation  Date/Time:  Thursday May 07 2017 16:37:26 EST Ventricular Rate:  114 PR Interval:    QRS Duration: 84 QT Interval:  298 QTC Calculation: 411 R Axis:   -174 Text Interpretation:  Sinus tachycardia Sinus pause Consider right ventricular hypertrophy Confirmed by Tanna Furry [phone]) on 05/07/2017 4:45:14 PM       Radiology Dg Chest 2 View  Result Date: 05/07/2017 CLINICAL DATA:  Shortness of breath beginning today. Currently undergoing chemotherapy for breast carcinoma. EXAM: CHEST  2 VIEW COMPARISON:  12/12/2016 and 05/10/2016 FINDINGS: New left arm PICC line is seen with tip overlying the superior cavoatrial junction. Right-sided power port remains in appropriate position. Mild cardiomegaly is stable. No evidence of pulmonary infiltrate or pleural effusion. Elevation of left hilum is again seen with dilatation of proximal pulmonary arteries. These findings are stable and suspicious for pulmonary arterial hypertension. Patient has undergone previous bilateral mastectomies with axillary lymph node dissections. IMPRESSION: No acute findings. Stable mild cardiomegaly, and dilatation of proximal pulmonary arteries suspicious for pulmonary arterial hypertension. Electronically Signed   By: Earle Gell M.D.   On: 05/07/2017 18:10   Ct Angio Chest Pe W And/or Wo Contrast  Result Date: 05/07/2017 CLINICAL DATA:  Three-day history of shortness of breath on exertion, tachypnea and fever. Patient underwent right total knee arthroplasty on 04/27/2017. Personal history of left breast cancer in 2009. EXAM: CT ANGIOGRAPHY CHEST WITH CONTRAST TECHNIQUE: Multidetector CT imaging of the chest was performed using the  standard protocol during bolus administration of intravenous contrast. Multiplanar CT image reconstructions and MIPs were obtained to evaluate the vascular anatomy. CONTRAST:  170m ISOVUE-370 IOPAMIDOL INJECTION 76% IV. COMPARISON:  No prior CTA chest.  PET-CT 05/30/2008 is correlated. FINDINGS: Cardiovascular: Contrast opacification of the pulmonary arteries is very good. Respiratory motion blurred images of the mid lungs and the lung bases. Overall, the study is of moderate diagnostic quality. No filling defects within either main pulmonary artery or their proximal segmental branches in either lung to suggest pulmonary embolism. Main pulmonary trunk mildly enlarged at approximately 4.0 cm diameter. Heart moderately enlarged with right atrial enlargement in particular. Mild LAD coronary atherosclerosis. No significant pericardial effusion. No visible aortic atherosclerosis. Right subclavian Port-A-Cath and left arm PICC have their tips in the lower SVC at or near the cavoatrial junction. Mediastinum/Nodes: No pathologically enlarged mediastinal, hilar or axillary lymph nodes. Prior left axillary node dissection. No mediastinal masses. Normal-appearing esophagus. Thyroid gland normal in appearance. Lungs/Pleura: Scar and bronchiectasis involving the left upper lobe anteriorly with associated marked volume loss, progressive since the prior PET-CT in 2010. Mild dependent atelectasis posteriorly in the lower lobes. Lungs otherwise clear. No confluent airspace consolidation. No evidence of interstitial lung disease. Central airways patent without significant bronchial wall thickening. No pleural effusions. Upper Abdomen: Possible splenomegaly, though the spleen is completely imaged. Visualized upper abdomen otherwise unremarkable for the early arterial phase of enhancement. Musculoskeletal: Exaggeration of  the usual thoracic kyphosis. Degenerative disc disease and spondylosis involving the mid and lower thoracic spine.  No acute osseous abnormalities. Other: Prior left mastectomy. No evidence of recurrent tumor in the left chest wall. Review of the MIP images confirms the above findings. IMPRESSION: 1. No evidence of central pulmonary embolism. 2. Mild dependent atelectasis in the lower lobes. No acute cardiopulmonary disease otherwise. Progressive scarring and bronchiectasis involving the left upper lobe, likely related to the prior radiation therapy at the time of breast cancer treatment. 3. Cardiomegaly with right atrial enlargement in particular. 4. Possible pulmonary arterial hypertension, as the main pulmonary trunk is mildly enlarged at 4.0 cm diameter. 5. Mild LAD coronary artery atherosclerosis. No visible aortic atherosclerosis. 6. Possible splenomegaly, though the spleen is incompletely imaged. Electronically Signed   By: Evangeline Dakin M.D.   On: 05/07/2017 20:05    Procedures Procedures (including critical care time)  Medications Ordered in ED Medications  sodium chloride 0.9 % bolus 1,000 mL (0 mLs Intravenous Stopped 05/07/17 1844)    And  sodium chloride 0.9 % bolus 1,000 mL (0 mLs Intravenous Stopped 05/07/17 1844)    And  sodium chloride 0.9 % bolus 500 mL (not administered)  iopamidol (ISOVUE-370) 76 % injection (not administered)  0.9 %  sodium chloride infusion (not administered)  acetaminophen (TYLENOL) tablet 650 mg (650 mg Oral Given 05/07/17 1754)  oxyCODONE (Oxy IR/ROXICODONE) immediate release tablet 10 mg (10 mg Oral Given 05/07/17 1754)  iopamidol (ISOVUE-370) 76 % injection 100 mL (100 mLs Intravenous Contrast Given 05/07/17 1913)     Initial Impression / Assessment and Plan / ED Course  I have reviewed the triage vital signs and the nursing notes.  Pertinent labs & imaging results that were available during my care of the patient were reviewed by me and considered in my medical decision making (see chart for details).   Pt febrile, tachy, sob. Active malignancy and Post op  TKA. Differential: Sepsis, re-infection of TKA, PE, UTI, Pneumonia, PIC line infection.  Pt NOT neutropenic. BLC obtained. Normal Lactate Hb 7.7. Given sepsis fluids, and tylenol. HR 102. CXR with PA hypertension and scarring. CTA No clot, or infection. BLC obtained from 1) Peripheral and 2) PIC. Will give PM Vancomycin.   Will need transfusion. Await blood cultures for guide to source. Hopefully not recurrent PJI of TKA  Final Clinical Impressions(s) / ED Diagnoses   Final diagnoses:  Anemia, unspecified type  Fever, unspecified fever cause    Note:  I was aware that the patient saw Dr. Alvan Dame her surgeon today. I id not know if Dr. Alvan Dame was aware the patient was febrile upon her arrival here. I discussed the case with Dr. Theda Sers on call for [facility] orthopedics. He states they will follow the patient starting tomorrow morning. Obviously any decision about possibility of infected joint or joint aspiration will be left up to her orthopedic team.  ED Discharge Orders    None       Tanna Furry, MD 05/07/17 2205

## 2017-05-07 NOTE — Telephone Encounter (Signed)
Spoke with Magda Paganini, advised that we were not the ones that ordered these labs, that a doctor at [facility] is the one that ordered blood transfusion and these results needed to go to him. Magda Paganini stated that she was verifying that they were sent to him as well.

## 2017-05-07 NOTE — ED Notes (Signed)
Bed assigned @ 2121 rm 1440

## 2017-05-07 NOTE — ED Notes (Signed)
Pt also states that she made need a transfusion as her hemoglobin was 7 last time it was checked.

## 2017-05-07 NOTE — ED Triage Notes (Signed)
Pt comes from Dr. Christell Faith office after following up for a total hip replacement.  Pt sees doctors at [facility] for her cancer and last chemo treatment was within the past week.  Has PICC line placed due to receiving long term vancomycin.  Pt is febrile on exam.  Tachypnea noted without chest pain.  Able to speak in full sentences but has difficulty with exertion. A&O x4. Restricted on the left arm.

## 2017-05-07 NOTE — Discharge Summary (Signed)
Physician Discharge Summary  Patient ID: [name] MRN: [id number] DOB/AGE: 61/01/1956 61 y.o.  Admit date: 04/27/2017 Discharge date: 04/30/2017   Procedures:  Procedure(s) (LRB): Reimplantation of right total knee arthroplasty (Right)  Attending Physician:  Dr. Paralee Cancel   Admission Diagnoses:   S/P infection and resection of right total knee arthroplasty   Discharge Diagnoses:  Active Problems:   Prosthetic joint infection, sequela   S/P revision of total knee, right   Enterococcal infection  Past Medical History:  Diagnosis Date  . 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 Allergic reaction 07/24/2016  . Anemia    needed transfusion spring of 2018  . Arthritis KNEES   right knee, hx. past left knee replacemnt.  . Blood clot in vein    around Portacath right chest-tx. Eliquis about a yr., prior Lovenox.  . Blood dyscrasia   . Chronic anticoagulation HX  PORT-A-CATH CLOT   2010 - HAS BEEN ON LOVENOX SINCE THE CLOT  . Drug rash 07/24/2016  . Elevated blood pressure reading without diagnosis of hypertension    PT MONITORS HER B/P AT HOME  . Heart murmur   . 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 PONV (postoperative nausea and vomiting)    ponv likes scopolamine patch  . Port-A-Cath in place    RIGHT CHEST   . 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 Skin changes related to chemotherapy    per patient she has scabbed over skin circumventing port to r ight upper chest ; denie drainage and state it is due to chemptherapy   . Status post skin flap graft    right chest wall with metastatis right anterior chest -no drainage or open wound.    HPI:    Pt is a 61 y.o. female s/p  resection of right TKA due to infection.  She has been treated with IV antibiotics and followed by infectious disease.  Labs were obtained to review her sed rate and CRP, to evaluate for infection. A long discussion was had between the patient and Dr. Alvan Dame with regards to the planned surgery.  I any hint of infection we would clean out the knee and plan for another antibiotic spacer.  Hopefully no infection would be seen and surgery could proceed with a TKA reimplanation.  Various options are discussed with the patient. Risks, benefits and expectations were discussed with the patient. Patient understand the risks, benefits and expectations and wishes to proceed with surgery. Risks, benefits and expectations were discussed with the patient.  She has healed up after receiving a skin graft and the potential risks of damaging the area were discussed.  Risks also including but not limited to the risk of anesthesia, blood clots, nerve damage, blood vessel damage, failure of the prosthesis, infection and up to and including death.  Patient understand the risks, benefits and expectations and wishes to proceed with surgery.  PCP: Midge Minium, MD   Discharged Condition: good  Hospital Course:  Patient underwent the above stated procedure on 04/27/2017. Patient tolerated the procedure well and brought to the recovery room in good condition and subsequently to the  floor.  POD #1 BP: 101/47 ; Pulse: 89 ; Temp: 98 F (36.7 C) ; Resp: 16 Patient reports pain as mild to moderate.  No events over night. Neurovascular intact, incision: dressing C/D/I, Prevana suction dressing in place to [facility] unit for time being with plans to convert to portable unit at discharge  LABS  Basename    HGB     9.1  HCT     27.1   POD #2  BP: 118/59 ; Pulse: 88 ; Temp: 98.5 F (36.9 C) ; Resp: 16 Patient reports pain as mild.  Doing well overall, no events.  Appreciate ID involvement and recs. Neurovascular intact and incision:  dressing C/D/I - Prevana dressing in place.  LABS  Basename    HGB     9.1  HCT     27.9   POD #3  BP: 124/53 ; Pulse: 113 ; Temp: 98.9 F (37.2 C) ; Resp: 16 Patient reports pain as moderate. Pain mainly medially, worse after therapy.  Notes incisional based pain.  No significant events, ready to get home. Neurovascular intact and incision: dressing C/D/I - Prevana dressing to suction. Swelling of RLE with medial based ecchymosis.  LABS   No new labs   Discharge Exam: General appearance: alert, cooperative and no distress Extremities: Homans sign is negative, no sign of DVT, no edema, redness or tenderness in the calves or thighs and no ulcers, gangrene or trophic changes  Disposition: Home with follow up in 2 weeks   Follow-up Information    Paralee Cancel, MD. Schedule an appointment as soon as possible for a visit in 1 week(s).   Specialty:  Orthopedic Surgery Why:  for dressing change Contact information: [address] [place] [address] [phone]        [facility] Follow up.   Specialty:  [place] Why:  nurse to assist with IV antibiotics Contact information: [address] [place] [address] [phone]           Discharge Instructions    Call MD / Call 911   Complete by:  As directed    If you experience chest pain or shortness of breath, CALL 911 and be transported to the hospital emergency room.  If you develope a fever above 101 F, pus (white drainage) or increased drainage or redness at the wound, or calf pain, call your surgeon's office.   Constipation Prevention   Complete by:  As directed    Drink plenty of fluids.  Prune juice may be helpful.  You may use a stool softener, such as Colace (over the counter) 100 mg twice a day.  Use MiraLax (over the counter) for constipation as needed.   Diet - low sodium heart healthy   Complete by:  As directed    Home infusion instructions  [facility] May follow [place] Dosing Protocol; May administer Cathflo as needed to maintain patency of vascular access device.; Flushing of vascular access device: per [facility] Protocol: 0.9% NaCl pre/post medica...   Complete by:  As directed    Instructions:  May follow [place] Dosing Protocol   Instructions:  May administer Cathflo as needed to maintain patency of vascular access device.   Instructions:  Flushing of vascular access device: per [facility] Protocol: 0.9% NaCl pre/post medication administration and prn patency; Heparin 100 u/ml, 37m for implanted ports and Heparin 10u/ml, 558mfor all other central venous catheters.   Instructions:  May follow  [facility] Anaphylaxis Protocol for First Dose Administration in the home: 0.9% NaCl at 25-50 ml/hr to maintain IV access for protocol meds. Epinephrine 0.3 ml IV/IM PRN and Benadryl 25-50 IV/IM PRN s/s of anaphylaxis.   Instructions:  [facility] Infusion Coordinator (RN) to assist per patient IV care needs in the home PRN.   Increase activity slowly as tolerated   Complete by:  As directed       Allergies as of 04/30/2017      Reactions   Penicillins Hives, Itching, Rash   Has patient had a PCN reaction causing immediate rash, facial/tongue/throat swelling, SOB or lightheadedness with hypotension: no Has patient had a PCN reaction causing severe rash involving mucus membranes or skin necrosis: No Has patient had a PCN reaction that required hospitalization No Has patient had a PCN reaction occurring within the last 10 years: yes If all of the above answers are "NO", then may proceed with Cephalosporin use. Denies airway involvement   Other Rash   STERI STRIPS - Blisters   Tape Hives   Can tolerate paper tape      Medication List    STOP taking these medications   darifenacin 15 MG 24 hr tablet Commonly known as:  ENABLEX     TAKE these medications   celecoxib 200 MG capsule Commonly known as:  CELEBREX Take 1 capsule  (200 mg total) by mouth every 12 (twelve) hours.   cetirizine 10 MG tablet Commonly known as:  ZYRTEC Take 1 tablet (10 mg total) daily by mouth.   docusate sodium 100 MG capsule Commonly known as:  COLACE Take 1 capsule (100 mg total) by mouth 2 (two) times daily. What changed:    when to take this  reasons to take this   docusate sodium 100 MG capsule Commonly known as:  COLACE Take 1 capsule (100 mg total) by mouth 2 (two) times daily. What changed:  You were already taking a medication with the same name, and this prescription was added. Make sure you understand how and when to take each.   enoxaparin 80 MG/0.8ML injection Commonly known as:  LOVENOX Inject 0.4 mLs (40 mg total) into the skin daily. What changed:  how much to take   ferrous sulfate 325 (65 FE) MG tablet Commonly known as:  FERROUSUL Take 1 tablet (325 mg total) by mouth 3 (three) times daily with meals.   furosemide 20 MG tablet Commonly known as:  LASIX Take 1 tablet (20 mg total) by mouth daily as needed for edema.   gabapentin 300 MG capsule Commonly known as:  NEURONTIN TAKE 1 CAPSULE BY MOUTH 3 TIMES DAILY   KADCYLA IV Inject into the vein every 21 ( twenty-one) days. At [facility] next treatment due 02-10-17   lidocaine-prilocaine cream Commonly known as:  EMLA APPLY TOPICALLY AS NEEDED  PRECHEMO   APPLY PRETREATMENT TO SKIN OVER PORT; PLACE BARRIER ON TOP OF THE CREAM   methocarbamol 500 MG tablet Commonly known as:  ROBAXIN Take 1 tablet (500 mg total) by mouth every 6 (six) hours as needed for muscle spasms.   multivitamin with minerals Tabs tablet Take 1 tablet by mouth daily.   ondansetron 8 MG tablet Commonly known as:  ZOFRAN Take 1 tablet (8 mg total) by mouth every 8 (eight) hours as needed for nausea. What changed:    reasons to take this  additional instructions   oxyCODONE 5 MG immediate release tablet Commonly known as:  Oxy IR/ROXICODONE Take 1-3 tablets (  5-15 mg total)  by mouth every 4 (four) hours as needed for severe pain. What changed:  when to take this   polyethylene glycol packet Commonly known as:  MIRALAX / GLYCOLAX Take 17 g by mouth 2 (two) times daily.   vancomycin IVPB Inject 750 mg into the vein every 12 (twelve) hours. Indication:  Prosthetic joint infection Last Day of Therapy:  06/09/17 Labs - Thursdays:  CBC/D and vancomycin trough Labs - Monday/Thursday:  BMP   Vitamin D3 2000 units Tabs Take 1 tablet by mouth 4 (four) times a week.   zolpidem 5 MG tablet Commonly known as:  AMBIEN TAKE 1 TABLET BY MOUTH ONCE DAILY AT BEDTIME AS NEEDED FOR SLEEP            Home Infusion Instuctions  (From admission, onward)        Start     Ordered   04/29/17 0000  Home infusion instructions [facility] May follow [place] Dosing Protocol; May administer Cathflo as needed to maintain patency of vascular access device.; Flushing of vascular access device: per [facility] Protocol: 0.9% NaCl pre/post medica...    Question Answer Comment  Instructions May follow [place] Dosing Protocol   Instructions May administer Cathflo as needed to maintain patency of vascular access device.   Instructions Flushing of vascular access device: per [facility] Protocol: 0.9% NaCl pre/post medication administration and prn patency; Heparin 100 u/ml, 37m for implanted ports and Heparin 10u/ml, 580mfor all other central venous catheters.   Instructions May follow [facility] Anaphylaxis Protocol for First Dose Administration in the home: 0.9% NaCl at 25-50 ml/hr to maintain IV access for protocol meds. Epinephrine 0.3 ml IV/IM PRN and Benadryl 25-50 IV/IM PRN s/s of anaphylaxis.   Instructions [facility] Infusion Coordinator (RN) to assist per patient IV care needs in the home PRN.      04/29/17 2024       Signed: MaWest PughBabish   PA-C  05/07/2017, 5:36 PM

## 2017-05-07 NOTE — Telephone Encounter (Signed)
[name] with [facility] calling critical labs - RBC 2.48    Hgb   7.5 . Will fax to office as well.

## 2017-05-07 NOTE — Progress Notes (Signed)
[facility]  [name] is an active pt with [facility].  [facility] is providing HHRN and Home Infusion pharmacy services for home IV ABX.  [facility] team will follow [name] while here to support transition home when ordered.  If patient discharges after hours, please call [phone].   Larry Sierras 05/07/2017, 5:15 PM

## 2017-05-08 ENCOUNTER — Other Ambulatory Visit: Payer: Self-pay

## 2017-05-08 ENCOUNTER — Inpatient Hospital Stay (HOSPITAL_COMMUNITY): Payer: 59

## 2017-05-08 DIAGNOSIS — R945 Abnormal results of liver function studies: Secondary | ICD-10-CM

## 2017-05-08 DIAGNOSIS — D6481 Anemia due to antineoplastic chemotherapy: Principal | ICD-10-CM

## 2017-05-08 DIAGNOSIS — R509 Fever, unspecified: Secondary | ICD-10-CM

## 2017-05-08 DIAGNOSIS — T451X5A Adverse effect of antineoplastic and immunosuppressive drugs, initial encounter: Secondary | ICD-10-CM

## 2017-05-08 DIAGNOSIS — D649 Anemia, unspecified: Secondary | ICD-10-CM

## 2017-05-08 DIAGNOSIS — R600 Localized edema: Secondary | ICD-10-CM

## 2017-05-08 DIAGNOSIS — A419 Sepsis, unspecified organism: Secondary | ICD-10-CM

## 2017-05-08 LAB — CBC
HCT: 26.2 % — ABNORMAL LOW (ref 36.0–46.0)
Hemoglobin: 8.3 g/dL — ABNORMAL LOW (ref 12.0–15.0)
MCH: 31.1 pg (ref 26.0–34.0)
MCHC: 31.7 g/dL (ref 30.0–36.0)
MCV: 98.1 fL (ref 78.0–100.0)
PLATELETS: 149 10*3/uL — AB (ref 150–400)
RBC: 2.67 MIL/uL — AB (ref 3.87–5.11)
RDW: 20.8 % — ABNORMAL HIGH (ref 11.5–15.5)
WBC: 3.6 10*3/uL — AB (ref 4.0–10.5)

## 2017-05-08 LAB — BASIC METABOLIC PANEL
Anion gap: 7 (ref 5–15)
Anion gap: 9 (ref 5–15)
BUN: 7 mg/dL (ref 6–20)
BUN: 7 mg/dL (ref 6–20)
CALCIUM: 8.3 mg/dL — AB (ref 8.9–10.3)
CHLORIDE: 102 mmol/L (ref 101–111)
CHLORIDE: 102 mmol/L (ref 101–111)
CO2: 26 mmol/L (ref 22–32)
CO2: 25 mmol/L (ref 22–32)
CREATININE: 0.6 mg/dL (ref 0.44–1.00)
CREATININE: 0.68 mg/dL (ref 0.44–1.00)
Calcium: 8.3 mg/dL — ABNORMAL LOW (ref 8.9–10.3)
GFR calc non Af Amer: >60 mL/min (ref >60)
GFR calc non Af Amer: >60 mL/min (ref >60)
Glucose, Bld: 112 mg/dL — ABNORMAL HIGH (ref 65–99)
Glucose, Bld: 118 mg/dL — ABNORMAL HIGH (ref 65–99)
Potassium: 3.2 mmol/L — ABNORMAL LOW (ref 3.5–5.1)
Potassium: 2.9 mmol/L — ABNORMAL LOW (ref 3.5–5.1)
SODIUM: 135 mmol/L (ref 135–145)
Sodium: 136 mmol/L (ref 135–145)

## 2017-05-08 LAB — PREPARE RBC (CROSSMATCH)

## 2017-05-08 LAB — LACTIC ACID, PLASMA: LACTIC ACID, VENOUS: 1.2 mmol/L (ref 0.5–1.9)

## 2017-05-08 LAB — URINE CULTURE: CULTURE: NO GROWTH

## 2017-05-08 LAB — BRAIN NATRIURETIC PEPTIDE
B NATRIURETIC PEPTIDE 5: 765 pg/mL — AB (ref 0.0–100.0)
B Natriuretic Peptide: 801 pg/mL — ABNORMAL HIGH (ref 0.0–100.0)

## 2017-05-08 LAB — PROCALCITONIN: PROCALCITONIN: 0.2 ng/mL

## 2017-05-08 LAB — MAGNESIUM: Magnesium: 1.7 mg/dL (ref 1.7–2.4)

## 2017-05-08 MED ORDER — MAGNESIUM SULFATE 4 GM/100ML IV SOLN
4.0000 g | Freq: Once | INTRAVENOUS | Status: AC
  Administered 2017-05-09: 4 g via INTRAVENOUS
  Filled 2017-05-08: qty 100

## 2017-05-08 MED ORDER — POTASSIUM CHLORIDE CRYS ER 20 MEQ PO TBCR
40.0000 meq | EXTENDED_RELEASE_TABLET | Freq: Once | ORAL | Status: AC
  Administered 2017-05-08: 40 meq via ORAL
  Filled 2017-05-08: qty 2

## 2017-05-08 MED ORDER — FUROSEMIDE 10 MG/ML IJ SOLN
40.0000 mg | Freq: Once | INTRAMUSCULAR | Status: AC
  Administered 2017-05-08: 40 mg via INTRAVENOUS
  Filled 2017-05-08: qty 4

## 2017-05-08 MED ORDER — LEVALBUTEROL HCL 0.63 MG/3ML IN NEBU: 0.6300 mg | INHALATION_SOLUTION | Freq: Four times a day (QID) | RESPIRATORY_TRACT | Status: DC | PRN

## 2017-05-08 NOTE — Consult Note (Signed)
[facility]  Total days of antibiotics 11/ vanco day 11 & day 2 cefepime       Reason for Consult: fever  Referring Physician: Grandville Silos  Principal Problem:   Symptomatic anemia Active Problems:   Metastatic breast cancer (HCC)   SOB (shortness of breath)   Sepsis ([place])   S/P total knee arthroplasty, right   Pyogenic arthritis of right knee joint ([place])   Enterococcal infection   Anemia due to chemotherapy   Abnormal LFTs    HPI: [name] is a 61 y.o. female hx of metastatic breast ca and complicated hx of enterococcal pji s/p 2 staged revision with new TKA placed on 12/3 of right knee, cultures from that surgery remain no growth to date. She was discharged on 6 wk of Iv vancomcyin for prophylaxis, given her past complications with her previous implant. She reports started to having fevers and shortness of breath , thus referred to the hospital for evaluation. On admit, she was noted to have anemia with hgb 7.9 down from 9.1 when she was previously discharged, and down from 12.8 prior to surgery. BNP almost 800. No leukocytosis. She underwent chest CT-A to rule out PE which was negative.  Past Medical History:  Diagnosis Date  . 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 Allergic reaction 07/24/2016  . Anemia    needed transfusion spring of 2018  . Arthritis KNEES   right knee, hx. past left knee replacemnt.  . Blood clot in vein    around Portacath right chest-tx. Eliquis about a yr., prior Lovenox.  . Blood dyscrasia   . Chronic anticoagulation HX  PORT-A-CATH CLOT   2010 - HAS BEEN ON LOVENOX SINCE THE CLOT  . Drug rash 07/24/2016  . Elevated blood pressure reading without diagnosis of hypertension    PT MONITORS HER B/P AT HOME  . Heart murmur   . 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 PONV (postoperative nausea and  vomiting)    ponv likes scopolamine patch  . Port-A-Cath in place    RIGHT CHEST   . 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 Skin changes related to chemotherapy    per patient she has scabbed over skin circumventing port to r ight upper chest ; denie drainage and state it is due to chemptherapy   . Status post skin flap graft    right chest wall with metastatis right anterior chest -no drainage or open wound.    Allergies:  Allergies  Allergen Reactions  . Penicillins Hives, Itching and Rash    Has patient had a PCN reaction causing immediate rash, facial/tongue/throat swelling, SOB or lightheadedness with hypotension: yes Has patient had a PCN reaction causing severe rash involving mucus membranes or skin necrosis: No Has patient had a PCN reaction that required hospitalization No Has patient had a PCN reaction occurring within the last 10 years: yes If all of the above answers are "NO", then may proceed with Cephalosporin use.   Denies airway involvement   . Other Rash    STERI STRIPS - Blisters  . Tape Hives  Can tolerate paper tape    MEDICATIONS: . [START ON 05/09/2017] cholecalciferol  2,000 Units Oral Once per day on Sun Tue Thu Sat  . darifenacin  15 mg Oral Daily  . docusate sodium  100 mg Oral BID  . enoxaparin  70 mg Subcutaneous Q24H  . gabapentin  300 mg Oral TID  . loratadine  10 mg Oral Daily  . multivitamin with minerals  1 tablet Oral Daily  . polyethylene glycol  17 g Oral BID  . sodium chloride flush  10-40 mL Intracatheter Q12H    Social History   Tobacco Use  . Smoking status: Never Smoker  . Smokeless tobacco: Never Used  Substance Use Topics  . Alcohol use: No  . Drug use: No    Family History  Problem Relation Age of Onset  . Heart disease Mother        due to mitral valve  regurgiation,   . Cancer Mother        breast  . Allergic rhinitis Mother   . Parkinsonism Father   . Allergic rhinitis Father   . Migraines Father   . Alcohol abuse Paternal Grandfather   . Angioedema Neg Hx   . Asthma Neg Hx   . Eczema Neg Hx     OBJECTIVE: Temp:  [98.3 F (36.8 C)-100.8 F (38.2 C)] 98.8 F (37.1 C) (12/14 1505) Pulse Rate:  [88-115] 110 (12/14 1505) Resp:  [16-28] 18 (12/14 1505) BP: (100-137)/(57-73) 137/72 (12/14 1505) SpO2:  [90 %-98 %] 93 % (12/14 1505) Weight:  [180 lb 12.4 oz (82 kg)-184 lb 11.9 oz (83.8 kg)] 184 lb 11.9 oz (83.8 kg) (12/13 2255)   LABS: Results for orders placed or performed during the hospital encounter of 05/07/17 (from the past 48 hour(s))  Urinalysis, Routine w reflex microscopic     Status: Abnormal   Collection Time: 05/07/17  5:21 PM  Result Value Ref Range   Color, Urine YELLOW YELLOW   APPearance HAZY (A) CLEAR   Specific Gravity, Urine 1.016 1.005 - 1.030   pH 5.0 5.0 - 8.0   Glucose, UA NEGATIVE NEGATIVE mg/dL   Hgb urine dipstick NEGATIVE NEGATIVE   Bilirubin Urine NEGATIVE NEGATIVE   Ketones, ur NEGATIVE NEGATIVE mg/dL   Protein, ur NEGATIVE NEGATIVE mg/dL   Nitrite NEGATIVE NEGATIVE   Leukocytes, UA TRACE (A) NEGATIVE   RBC / HPF 0-5 0 - 5 RBC/hpf   WBC, UA 0-5 0 - 5 WBC/hpf   Bacteria, UA NONE SEEN NONE SEEN   Squamous Epithelial / LPF 6-30 (A) NONE SEEN   Mucus PRESENT    Hyaline Casts, UA PRESENT    Non Squamous Epithelial 0-5 (A) NONE SEEN  Urine Culture     Status: None   Collection Time: 05/07/17  5:21 PM  Result Value Ref Range   Specimen Description URINE, CLEAN CATCH    Special Requests NONE    Culture      NO GROWTH Performed at [facility], [address] [place], [place] [address]    Report Status 05/08/2017 FINAL   Blood Culture (routine x 2)     Status: None (Preliminary result)   Collection Time: 05/07/17  5:50 PM  Result Value Ref Range   Specimen Description BLOOD RIGHT  FOREARM    Special Requests      BOTTLES DRAWN AEROBIC AND ANAEROBIC Blood Culture adequate volume   Culture      NO GROWTH < 24 HOURS Performed at [facility]  [facility], [place] [address], [place], [place] [address]    Report Status PENDING   Blood Culture (routine x 2)     Status: None (Preliminary result)   Collection Time: 05/07/17  5:55 PM  Result Value Ref Range   Specimen Description BLOOD PICC LINE LEFT    Special Requests      BOTTLES DRAWN AEROBIC AND ANAEROBIC Blood Culture adequate volume   Culture      NO GROWTH < 24 HOURS Performed at [facility], [place] [address] [place], [place] [address]    Report Status PENDING   Comprehensive metabolic panel     Status: Abnormal   Collection Time: 05/07/17  5:56 PM  Result Value Ref Range   Sodium 132 (L) 135 - 145 mmol/L   Potassium 3.9 3.5 - 5.1 mmol/L   Chloride 99 (L) 101 - 111 mmol/L   CO2 24 22 - 32 mmol/L   Glucose, Bld 103 (H) 65 - 99 mg/dL   BUN 8 6 - 20 mg/dL   Creatinine, Ser 0.60 0.44 - 1.00 mg/dL   Calcium 8.9 8.9 - 10.3 mg/dL   Total Protein 7.0 6.5 - 8.1 g/dL   Albumin 3.2 (L) 3.5 - 5.0 g/dL   AST 65 (H) 15 - 41 U/L   ALT 21 14 - 54 U/L   Alkaline Phosphatase 118 38 - 126 U/L   Total Bilirubin 2.3 (H) 0.3 - 1.2 mg/dL   GFR calc non Af Amer >60 >60 mL/min   GFR calc Af Amer >60 >60 mL/min    Comment: (NOTE) The eGFR has been calculated using the CKD EPI equation. This calculation has not been validated in all clinical situations. eGFR's persistently <60 mL/min signify possible Chronic Kidney Disease.    Anion gap 9 5 - 15  CBC with Differential     Status: Abnormal   Collection Time: 05/07/17  5:56 PM  Result Value Ref Range   WBC 5.6 4.0 - 10.5 K/uL   RBC 2.34 (L) 3.87 - 5.11 MIL/uL   Hemoglobin 7.6 (L) 12.0 - 15.0 g/dL   HCT 23.6 (L) 36.0 - 46.0 %   MCV 100.9 (H) 78.0 - 100.0 fL   MCH 32.5 26.0 - 34.0 pg   MCHC 32.2 30.0 - 36.0 g/dL   RDW 20.2 (H) 11.5 - 15.5 %   Platelets 155 150 - 400 K/uL     Neutrophils Relative % 79 %   Neutro Abs 4.4 1.7 - 7.7 K/uL   Lymphocytes Relative 12 %   Lymphs Abs 0.7 0.7 - 4.0 K/uL   Monocytes Relative 5 %   Monocytes Absolute 0.3 0.1 - 1.0 K/uL   Eosinophils Relative 4 %   Eosinophils Absolute 0.2 0.0 - 0.7 K/uL   Basophils Relative 0 %   Basophils Absolute 0.0 0.0 - 0.1 K/uL  I-Stat CG4 Lactic Acid, ED     Status: None   Collection Time: 05/07/17  6:05 PM  Result Value Ref Range   Lactic Acid, Venous 1.73 0.5 - 1.9 mmol/L  Type and screen [place]     Status: None (Preliminary result)   Collection Time: 05/07/17  6:05 PM  Result Value Ref Range   ABO/RH(D) A POS    Antibody Screen POS    Sample Expiration 05/10/2017    Antibody Identification ANTI c ANTI E    Unit Number [id number]    Blood Component Type RED CELLS,LR    Unit division 00  Status of Unit ISSUED    Donor AG Type NEGATIVE FOR c ANTIGEN NEGATIVE FOR E ANTIGEN    Transfusion Status OK TO TRANSFUSE    Crossmatch Result COMPATIBLE    Unit Number [id number]    Blood Component Type RED CELLS,LR    Unit division 00    Status of Unit ISSUED    Donor AG Type NEGATIVE FOR c ANTIGEN NEGATIVE FOR E ANTIGEN    Transfusion Status OK TO TRANSFUSE    Crossmatch Result COMPATIBLE   Prepare RBC     Status: None   Collection Time: 05/07/17  6:05 PM  Result Value Ref Range   Order Confirmation ORDER PROCESSED BY BLOOD BANK   Lactic acid, plasma     Status: None   Collection Time: 05/07/17 11:45 PM  Result Value Ref Range   Lactic Acid, Venous 1.2 0.5 - 1.9 mmol/L  Procalcitonin     Status: None   Collection Time: 05/07/17 11:45 PM  Result Value Ref Range   Procalcitonin 0.20 ng/mL    Comment:        Interpretation: PCT (Procalcitonin) <= 0.5 ng/mL: Systemic infection (sepsis) is not likely. Local bacterial infection is possible. (NOTE)       Sepsis PCT Algorithm           Lower Respiratory Tract                                      Infection  PCT Algorithm    ----------------------------     ----------------------------         PCT < 0.25 ng/mL                PCT < 0.10 ng/mL         Strongly encourage             Strongly discourage   discontinuation of antibiotics    initiation of antibiotics    ----------------------------     -----------------------------       PCT 0.25 - 0.50 ng/mL            PCT 0.10 - 0.25 ng/mL               OR       >80% decrease in PCT            Discourage initiation of                                            antibiotics      Encourage discontinuation           of antibiotics    ----------------------------     -----------------------------         PCT >= 0.50 ng/mL              PCT 0.26 - 0.50 ng/mL               AND        <80% decrease in PCT             Encourage initiation of  antibiotics       Encourage continuation           of antibiotics    ----------------------------     -----------------------------        PCT >= 0.50 ng/mL                  PCT > 0.50 ng/mL               AND         increase in PCT                  Strongly encourage                                      initiation of antibiotics    Strongly encourage escalation           of antibiotics                                     -----------------------------                                           PCT <= 0.25 ng/mL                                                 OR                                        > 80% decrease in PCT                                     Discontinue / Do not initiate                                             antibiotics   Brain natriuretic peptide     Status: Abnormal   Collection Time: 05/07/17 11:45 PM  Result Value Ref Range   B Natriuretic Peptide 801.0 (H) 0.0 - 100.0 pg/mL  Basic metabolic panel     Status: Abnormal   Collection Time: 05/08/17  8:06 AM  Result Value Ref Range   Sodium 135 135 - 145 mmol/L   Potassium 3.2 (L) 3.5 - 5.1  mmol/L    Comment: DELTA CHECK NOTED REPEATED TO VERIFY    Chloride 102 101 - 111 mmol/L   CO2 26 22 - 32 mmol/L   Glucose, Bld 112 (H) 65 - 99 mg/dL   BUN 7 6 - 20 mg/dL   Creatinine, Ser 0.60 0.44 - 1.00 mg/dL   Calcium 8.3 (L) 8.9 - 10.3 mg/dL   GFR calc non Af Amer >60 >60 mL/min   GFR calc Af Amer >60 >60 mL/min    Comment: (NOTE) The eGFR has been calculated using the CKD EPI equation. This  calculation has not been validated in all clinical situations. eGFR's persistently <60 mL/min signify possible Chronic Kidney Disease.    Anion gap 7 5 - 15  CBC     Status: Abnormal   Collection Time: 05/08/17  8:06 AM  Result Value Ref Range   WBC 3.6 (L) 4.0 - 10.5 K/uL   RBC 2.67 (L) 3.87 - 5.11 MIL/uL   Hemoglobin 8.3 (L) 12.0 - 15.0 g/dL   HCT 26.2 (L) 36.0 - 46.0 %   MCV 98.1 78.0 - 100.0 fL   MCH 31.1 26.0 - 34.0 pg   MCHC 31.7 30.0 - 36.0 g/dL   RDW 20.8 (H) 11.5 - 15.5 %   Platelets 149 (L) 150 - 400 K/uL  Basic metabolic panel     Status: Abnormal   Collection Time: 05/08/17 10:29 AM  Result Value Ref Range   Sodium 136 135 - 145 mmol/L   Potassium 2.9 (L) 3.5 - 5.1 mmol/L   Chloride 102 101 - 111 mmol/L   CO2 25 22 - 32 mmol/L   Glucose, Bld 118 (H) 65 - 99 mg/dL   BUN 7 6 - 20 mg/dL   Creatinine, Ser 0.68 0.44 - 1.00 mg/dL   Calcium 8.3 (L) 8.9 - 10.3 mg/dL   GFR calc non Af Amer >60 >60 mL/min   GFR calc Af Amer >60 >60 mL/min    Comment: (NOTE) The eGFR has been calculated using the CKD EPI equation. This calculation has not been validated in all clinical situations. eGFR's persistently <60 mL/min signify possible Chronic Kidney Disease.    Anion gap 9 5 - 15  Magnesium     Status: None   Collection Time: 05/08/17 10:29 AM  Result Value Ref Range   Magnesium 1.7 1.7 - 2.4 mg/dL  Brain natriuretic peptide     Status: Abnormal   Collection Time: 05/08/17 10:29 AM  Result Value Ref Range   B Natriuretic Peptide 765.0 (H) 0.0 - 100.0 pg/mL     MICRO: 12/13 blood cx pending IMAGING: Dg Chest 2 View  Result Date: 05/07/2017 CLINICAL DATA:  Shortness of breath beginning today. Currently undergoing chemotherapy for breast carcinoma. EXAM: CHEST  2 VIEW COMPARISON:  12/12/2016 and 05/10/2016 FINDINGS: New left arm PICC line is seen with tip overlying the superior cavoatrial junction. Right-sided power port remains in appropriate position. Mild cardiomegaly is stable. No evidence of pulmonary infiltrate or pleural effusion. Elevation of left hilum is again seen with dilatation of proximal pulmonary arteries. These findings are stable and suspicious for pulmonary arterial hypertension. Patient has undergone previous bilateral mastectomies with axillary lymph node dissections. IMPRESSION: No acute findings. Stable mild cardiomegaly, and dilatation of proximal pulmonary arteries suspicious for pulmonary arterial hypertension. Electronically Signed   By: Earle Gell M.D.   On: 05/07/2017 18:10   Ct Angio Chest Pe W And/or Wo Contrast  Result Date: 05/07/2017 CLINICAL DATA:  Three-day history of shortness of breath on exertion, tachypnea and fever. Patient underwent right total knee arthroplasty on 04/27/2017. Personal history of left breast cancer in 2009. EXAM: CT ANGIOGRAPHY CHEST WITH CONTRAST TECHNIQUE: Multidetector CT imaging of the chest was performed using the standard protocol during bolus administration of intravenous contrast. Multiplanar CT image reconstructions and MIPs were obtained to evaluate the vascular anatomy. CONTRAST:  141m ISOVUE-370 IOPAMIDOL INJECTION 76% IV. COMPARISON:  No prior CTA chest.  PET-CT 05/30/2008 is correlated. FINDINGS: Cardiovascular: Contrast opacification of the pulmonary arteries is very good. Respiratory motion blurred images of the  mid lungs and the lung bases. Overall, the study is of moderate diagnostic quality. No filling defects within either main pulmonary artery or their proximal segmental  branches in either lung to suggest pulmonary embolism. Main pulmonary trunk mildly enlarged at approximately 4.0 cm diameter. Heart moderately enlarged with right atrial enlargement in particular. Mild LAD coronary atherosclerosis. No significant pericardial effusion. No visible aortic atherosclerosis. Right subclavian Port-A-Cath and left arm PICC have their tips in the lower SVC at or near the cavoatrial junction. Mediastinum/Nodes: No pathologically enlarged mediastinal, hilar or axillary lymph nodes. Prior left axillary node dissection. No mediastinal masses. Normal-appearing esophagus. Thyroid gland normal in appearance. Lungs/Pleura: Scar and bronchiectasis involving the left upper lobe anteriorly with associated marked volume loss, progressive since the prior PET-CT in 2010. Mild dependent atelectasis posteriorly in the lower lobes. Lungs otherwise clear. No confluent airspace consolidation. No evidence of interstitial lung disease. Central airways patent without significant bronchial wall thickening. No pleural effusions. Upper Abdomen: Possible splenomegaly, though the spleen is completely imaged. Visualized upper abdomen otherwise unremarkable for the early arterial phase of enhancement. Musculoskeletal: Exaggeration of the usual thoracic kyphosis. Degenerative disc disease and spondylosis involving the mid and lower thoracic spine. No acute osseous abnormalities. Other: Prior left mastectomy. No evidence of recurrent tumor in the left chest wall. Review of the MIP images confirms the above findings. IMPRESSION: 1. No evidence of central pulmonary embolism. 2. Mild dependent atelectasis in the lower lobes. No acute cardiopulmonary disease otherwise. Progressive scarring and bronchiectasis involving the left upper lobe, likely related to the prior radiation therapy at the time of breast cancer treatment. 3. Cardiomegaly with right atrial enlargement in particular. 4. Possible pulmonary arterial  hypertension, as the main pulmonary trunk is mildly enlarged at 4.0 cm diameter. 5. Mild LAD coronary artery atherosclerosis. No visible aortic atherosclerosis. 6. Possible splenomegaly, though the spleen is incompletely imaged. Electronically Signed   By: Evangeline Dakin M.D.   On: 05/07/2017 20:05   Assessment/Plan:  61yo F with past hx of enterococcal pji of right knee s/p 2 staged revision for which she had reimplantation on 12/3. She had a new left picc line on 12/6 placed to receive IV vancomycin x 6 wk for prophylaxis but now has fever and shortness of breath. - she is currently on vancomycin and cefepime empirically - imaging does not suggest pneumonia, nor uti - await blood cx. possibly could be line infection - it would be unusual to have acute infection new prosthesis. Will defer to dr .Aurea Graff team and assessment - continue with getting cbc to see if she meets blood tsf criteria - Dr Megan Salon to see tomorrow

## 2017-05-08 NOTE — Progress Notes (Signed)
PT Cancellation Note  Patient Details Name: NASTASSJA WITKOP MRN: [id number] DOB: [birth year]   Cancelled Treatment:    Reason Eval/Treat Not Completed: Medical issues which prohibited therapy(dopplers to r/o DVT pending. Will follow )   Philomena Doheny 05/08/2017, 11:11 AM [phone]

## 2017-05-08 NOTE — Progress Notes (Addendum)
*  Preliminary Results* Bilateral lower extremity venous duplex completed. Bilateral lower extremities are negative for deep vein thrombosis. There is evidence of superficial vein thrombosis involving the right greater saphenous vein at the mid calf. There is no evidence of Baker's cyst bilaterally.  05/08/2017 3:43 PM Maudry Mayhew, BS, RVT, RDCS, RDMS

## 2017-05-08 NOTE — Care Management Note (Signed)
Case Management Note  Patient Details  Name: [name] MRN: [id number] Date of Birth: 1955/10/02  Subjective/Objective: 61 y/o f admitted w/Symptomatic anemia. Readmit 12/3-R TKA. Active w/[facility]/PT-[facility] rep Santiago Glad aware, has rw.Patient already states she doesn't want SNF.                   Action/Plan:d/c plan home w/HHC.   Expected Discharge Date:  (unknown)               Expected Discharge Plan:  [place]  In-House Referral:     Discharge planning Services  CM Consult  Post Acute Care Choice:  Home Health(Active w/HHC-HHRN/PT) Choice offered to:     DME Arranged:    DME Agency:     HH Arranged:    [place] Agency:     Status of Service:  In process, will continue to follow  If discussed at Long Length of Stay Meetings, dates discussed:    Additional Comments:  Dessa Phi, RN 05/08/2017, 2:20 PM

## 2017-05-08 NOTE — Progress Notes (Signed)
PROGRESS NOTE    [name]  GQQ:[id number] DOB: 11-25-1955 DOA: 05/07/2017 PCP: Midge Minium, MD    Brief Narrative:  [name] is a 61 y.o. 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  Pt states that she has been have SOB recently. She states that her hemoglobin normally is around 11. She states that her oncologist planned to transfuse her with 2 units of blood because her hemoglobin dropped to near 7.0, but not done yet since patient has multiple RBC antibodies. Patient denies chest pain, cough. She states that her shortness of breath is aggravated by exertion. Not tenderness in calf areas. She states that she has fever of 100.2 and chills today.   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 She had hardware removal in July of this year and with prolonged antibiotics. She underwent re-do right TKA by Dr. Alvan Dame on 12/3 with initiation of peri-operative Vancomycin starting 11/30, and PIC line placement 12/6 with continued BID vancomycin, planned for 6 weeks. She states the the pain in her right knee has improved, but still has swelling. Patient does not have nausea, vomiting, diarrhea or abdominal pain. No symptoms of UTI. Patient was seen by orthopedic surgeon today. Due to SOB, she was sent to ED for further evaluation and treatment.  ED Course: pt was found to have wBC 5.6, lactic acid 1.73, urinalysis with trace amount of leukocyte, electrolytes renal function okay, temperature 99.7, tachycardia, tachypnea, negative chest x-ray. His patient is admitted to telemetry bed as inpatient. ED physician will contact orthopedic surgeon.      Assessment & Plan:   Principal Problem:   Symptomatic anemia Active Problems:   Metastatic breast cancer (HCC)   SOB (shortness of  breath)   Sepsis (HCC)   S/P total knee arthroplasty, right   Pyogenic arthritis of right knee joint (HCC)   Enterococcal infection   Anemia due to chemotherapy   Abnormal LFTs  #1 symptomatic anemia/anemia due to chemotherapy and shortness of breath Presented with shortness of breath felt likely to be a symptomatic anemia.  Baseline hemoglobin around 11.  Patient will drop down to 7.6 on day of admission.  Some concern also for acute CHF exacerbation.  BNP was elevated.  2D echo on 04/24/2017 with a EF of 65-70% with grade 1 diastolic dysfunction.  Patient status post transfusion of 1 unit packed red blood cells.  Second unit about to be transfused.  Patient states shortness of breath has improved post transfusion.  Respiratory exam negative for any crackles.  Lungs seem clear to auscultation.  CT angiogram chest negative for PE or pneumonia/infiltrate.  Continue Xopenex nebs as needed.  Follow closely.  2.  Fever/possible sepsis Questionable etiology.  Patient on admission was noted to have a fever, tachycardia, tachypnea.  Lactic acid level was normal although.  Patient status post recent knee procedure.  Patient on IV antibiotics prior to presentation secondary to a post procedure infection with enterococci.  Patient was on empiric IV vancomycin.  Patient started on IV cefepime.  Blood cultures pending.  Due to patient's complicated history, recent history of right knee joint enterococcal infection will have orthopedics, Dr. Alvan Dame reassessed the patient during this hospitalization.  We will also consult with ID for further evaluation and management.  Follow.  3.  Abnormal LFTs Acute hepatitis panel pending.  Follow.  4. Bilateral lower extremity edema right greater than left Likely secondary to recent right joint infection/recent right knee surgery.  Lower extremity Dopplers done negative for DVT.  Evidence of superficial vein thrombosis involving right greater saphenous vein at the mid calf.   Negative for Baker's cyst bilaterally.  Orthopedic consultation pending.     DVT prophylaxis: Lovenox Code Status: Full Family Communication: Updated patient and husband at bedside. Disposition Plan: To be Determined.   Consultants:   None  Procedures:   CT angiogram chest 05/07/2017  Chest x-ray 05/07/2017  Lower extremity Dopplers 05/08/2017  2 units packed red blood cells 05/08/2017  Antimicrobials:   IV vancomycin 05/07/2017  IV cefepime 05/07/2017   Subjective: States shortness of breath improved with transfusion of first packed red blood cells.  Patient denies any chest pain.  Objective: Vitals:   05/08/17 0243 05/08/17 0315 05/08/17 0529 05/08/17 0754  BP: 115/64 (!) 117/57 126/70   Pulse: 100 (!) 103 (!) 112   Resp: (!) 22 (!) 22 (!) 22   Temp: 99.4 F (37.4 C) 99.8 F (37.7 C) 100.1 F (37.8 C) 98.3 F (36.8 C)  TempSrc: Oral Oral Oral Oral  SpO2: 93% 91% 90%   Weight:      Height:        Intake/Output Summary (Last 24 hours) at 05/08/2017 1122 Last data filed at 05/08/2017 1021 Gross per 24 hour  Intake 585 ml  Output -  Net 585 ml   Filed Weights   05/07/17 1635 05/07/17 2255  Weight: 82 kg (180 lb 12.4 oz) 83.8 kg (184 lb 11.9 oz)    Examination:  General exam: Appears calm and comfortable  Respiratory system: Clear to auscultation.  No wheezes, no crackles, no rhonchi.  Respiratory effort normal. Cardiovascular system: S1 & S2 heard, RRR. No JVD, murmurs, rubs, gallops or clicks.  Bilateral lower extremity edema right greater than left. Gastrointestinal system: Abdomen is nondistended, soft and nontender. No organomegaly or masses felt. Normal bowel sounds heard. Central nervous system: Alert and oriented. No focal neurological deficits. Extremities: Right knee with swelling, slight erythema, some tenderness to palpation, 2+ edema.  Skin: No rashes, lesions or ulcers Psychiatry: Judgement and insight appear normal. Mood & affect  appropriate.     Data Reviewed: I have personally reviewed following labs and imaging studies  CBC: Recent Labs  Lab 05/07/17 1756 05/08/17 0806  WBC 5.6 3.6*  NEUTROABS 4.4  --   HGB 7.6* 8.3*  HCT 23.6* 26.2*  MCV 100.9* 98.1  PLT 155 865*   Basic Metabolic Panel: Recent Labs  Lab 05/07/17 1756 05/08/17 0806 05/08/17 1029  NA 132* 135 136  K 3.9 3.2* 2.9*  CL 99* 102 102  CO2 24 26 25   GLUCOSE 103* 112* 118*  BUN 8 7 7   CREATININE 0.60 0.60 0.68  CALCIUM 8.9 8.3* 8.3*  MG  --   --  1.7   GFR: Estimated Creatinine Clearance: 83.2 mL/min (by C-G formula based on SCr of 0.68 mg/dL). Liver Function Tests: Recent Labs  Lab 05/07/17 1756  AST 65*  ALT 21  ALKPHOS 118  BILITOT 2.3*  PROT 7.0  ALBUMIN 3.2*   No results for input(s): LIPASE, AMYLASE in the last 168 hours. No results for input(s): AMMONIA in the last 168 hours. Coagulation Profile: No results for input(s): INR, PROTIME in the last 168 hours. Cardiac Enzymes: No results for input(s): CKTOTAL, CKMB, CKMBINDEX, TROPONINI in the last 168 hours. BNP (last 3 results)  No results for input(s): PROBNP in the last 8760 hours. HbA1C: No results for input(s): HGBA1C in the last 72 hours. CBG: No results for input(s): GLUCAP in the last 168 hours. Lipid Profile: No results for input(s): CHOL, HDL, LDLCALC, TRIG, CHOLHDL, LDLDIRECT in the last 72 hours. Thyroid Function Tests: No results for input(s): TSH, T4TOTAL, FREET4, T3FREE, THYROIDAB in the last 72 hours. Anemia Panel: No results for input(s): VITAMINB12, FOLATE, FERRITIN, TIBC, IRON, RETICCTPCT in the last 72 hours. Sepsis Labs: Recent Labs  Lab 05/07/17 1805 05/07/17 2345  PROCALCITON  --  0.20  LATICACIDVEN 1.73 1.2    No results found for this or any previous visit (from the past 240 hour(s)).       Radiology Studies: Dg Chest 2 View  Result Date: 05/07/2017 CLINICAL DATA:  Shortness of breath beginning today. Currently  undergoing chemotherapy for breast carcinoma. EXAM: CHEST  2 VIEW COMPARISON:  12/12/2016 and 05/10/2016 FINDINGS: New left arm PICC line is seen with tip overlying the superior cavoatrial junction. Right-sided power port remains in appropriate position. Mild cardiomegaly is stable. No evidence of pulmonary infiltrate or pleural effusion. Elevation of left hilum is again seen with dilatation of proximal pulmonary arteries. These findings are stable and suspicious for pulmonary arterial hypertension. Patient has undergone previous bilateral mastectomies with axillary lymph node dissections. IMPRESSION: No acute findings. Stable mild cardiomegaly, and dilatation of proximal pulmonary arteries suspicious for pulmonary arterial hypertension. Electronically Signed   By: Earle Gell M.D.   On: 05/07/2017 18:10   Ct Angio Chest Pe W And/or Wo Contrast  Result Date: 05/07/2017 CLINICAL DATA:  Three-day history of shortness of breath on exertion, tachypnea and fever. Patient underwent right total knee arthroplasty on 04/27/2017. Personal history of left breast cancer in 2009. EXAM: CT ANGIOGRAPHY CHEST WITH CONTRAST TECHNIQUE: Multidetector CT imaging of the chest was performed using the standard protocol during bolus administration of intravenous contrast. Multiplanar CT image reconstructions and MIPs were obtained to evaluate the vascular anatomy. CONTRAST:  165mL ISOVUE-370 IOPAMIDOL INJECTION 76% IV. COMPARISON:  No prior CTA chest.  PET-CT 05/30/2008 is correlated. FINDINGS: Cardiovascular: Contrast opacification of the pulmonary arteries is very good. Respiratory motion blurred images of the mid lungs and the lung bases. Overall, the study is of moderate diagnostic quality. No filling defects within either main pulmonary artery or their proximal segmental branches in either lung to suggest pulmonary embolism. Main pulmonary trunk mildly enlarged at approximately 4.0 cm diameter. Heart moderately enlarged with  right atrial enlargement in particular. Mild LAD coronary atherosclerosis. No significant pericardial effusion. No visible aortic atherosclerosis. Right subclavian Port-A-Cath and left arm PICC have their tips in the lower SVC at or near the cavoatrial junction. Mediastinum/Nodes: No pathologically enlarged mediastinal, hilar or axillary lymph nodes. Prior left axillary node dissection. No mediastinal masses. Normal-appearing esophagus. Thyroid gland normal in appearance. Lungs/Pleura: Scar and bronchiectasis involving the left upper lobe anteriorly with associated marked volume loss, progressive since the prior PET-CT in 2010. Mild dependent atelectasis posteriorly in the lower lobes. Lungs otherwise clear. No confluent airspace consolidation. No evidence of interstitial lung disease. Central airways patent without significant bronchial wall thickening. No pleural effusions. Upper Abdomen: Possible splenomegaly, though the spleen is completely imaged. Visualized upper abdomen otherwise unremarkable for the early arterial phase of enhancement. Musculoskeletal: Exaggeration of the usual thoracic kyphosis. Degenerative disc disease and spondylosis involving the mid and lower thoracic spine. No acute osseous abnormalities. Other: Prior left mastectomy. No evidence of recurrent tumor in the  left chest wall. Review of the MIP images confirms the above findings. IMPRESSION: 1. No evidence of central pulmonary embolism. 2. Mild dependent atelectasis in the lower lobes. No acute cardiopulmonary disease otherwise. Progressive scarring and bronchiectasis involving the left upper lobe, likely related to the prior radiation therapy at the time of breast cancer treatment. 3. Cardiomegaly with right atrial enlargement in particular. 4. Possible pulmonary arterial hypertension, as the main pulmonary trunk is mildly enlarged at 4.0 cm diameter. 5. Mild LAD coronary artery atherosclerosis. No visible aortic atherosclerosis. 6.  Possible splenomegaly, though the spleen is incompletely imaged. Electronically Signed   By: Evangeline Dakin M.D.   On: 05/07/2017 20:05        Scheduled Meds: . [START ON 05/09/2017] cholecalciferol  2,000 Units Oral Once per day on Sun Tue Thu Sat  . darifenacin  15 mg Oral Daily  . docusate sodium  100 mg Oral BID  . enoxaparin  70 mg Subcutaneous Q24H  . gabapentin  300 mg Oral TID  . loratadine  10 mg Oral Daily  . multivitamin with minerals  1 tablet Oral Daily  . polyethylene glycol  17 g Oral BID  . sodium chloride flush  10-40 mL Intracatheter Q12H   Continuous Infusions: . ceFEPime (MAXIPIME) IV Stopped (05/08/17 0810)  . vancomycin 750 mg (05/08/17 1029)     LOS: 1 day    Time spent: 40 minutes    Irine Seal, MD [facility] Pager [phone]  If 7PM-7AM, please contact night-coverage [email or web address] Password [email or web address] 05/08/2017, 11:22 AM

## 2017-05-09 DIAGNOSIS — Z96651 Presence of right artificial knee joint: Secondary | ICD-10-CM

## 2017-05-09 DIAGNOSIS — R0602 Shortness of breath: Secondary | ICD-10-CM

## 2017-05-09 DIAGNOSIS — Z9889 Other specified postprocedural states: Secondary | ICD-10-CM

## 2017-05-09 DIAGNOSIS — Z91048 Other nonmedicinal substance allergy status: Secondary | ICD-10-CM

## 2017-05-09 DIAGNOSIS — A491 Streptococcal infection, unspecified site: Secondary | ICD-10-CM

## 2017-05-09 DIAGNOSIS — Z880 Allergy status to penicillin: Secondary | ICD-10-CM

## 2017-05-09 DIAGNOSIS — C50919 Malignant neoplasm of unspecified site of unspecified female breast: Secondary | ICD-10-CM

## 2017-05-09 DIAGNOSIS — Z9689 Presence of other specified functional implants: Secondary | ICD-10-CM

## 2017-05-09 DIAGNOSIS — M009 Pyogenic arthritis, unspecified: Secondary | ICD-10-CM

## 2017-05-09 LAB — CBC WITH DIFFERENTIAL/PLATELET
Basophils Absolute: 0 10*3/uL (ref 0.0–0.1)
Basophils Relative: 0 %
EOS ABS: 0.3 10*3/uL (ref 0.0–0.7)
EOS PCT: 7 %
HCT: 30.3 % — ABNORMAL LOW (ref 36.0–46.0)
Hemoglobin: 9.9 g/dL — ABNORMAL LOW (ref 12.0–15.0)
LYMPHS ABS: 0.5 10*3/uL — AB (ref 0.7–4.0)
LYMPHS PCT: 14 %
MCH: 31.2 pg (ref 26.0–34.0)
MCHC: 32.7 g/dL (ref 30.0–36.0)
MCV: 95.6 fL (ref 78.0–100.0)
MONO ABS: 0.3 10*3/uL (ref 0.1–1.0)
MONOS PCT: 9 %
Neutro Abs: 2.5 10*3/uL (ref 1.7–7.7)
Neutrophils Relative %: 70 %
PLATELETS: 153 10*3/uL (ref 150–400)
RBC: 3.17 MIL/uL — AB (ref 3.87–5.11)
RDW: 21 % — AB (ref 11.5–15.5)
WBC: 3.7 10*3/uL — AB (ref 4.0–10.5)

## 2017-05-09 LAB — BASIC METABOLIC PANEL
Anion gap: 9 (ref 5–15)
BUN: 6 mg/dL (ref 6–20)
CHLORIDE: 100 mmol/L — AB (ref 101–111)
CO2: 25 mmol/L (ref 22–32)
CREATININE: 0.56 mg/dL (ref 0.44–1.00)
Calcium: 8.1 mg/dL — ABNORMAL LOW (ref 8.9–10.3)
GFR calc Af Amer: >60 mL/min (ref >60)
GFR calc non Af Amer: >60 mL/min (ref >60)
GLUCOSE: 125 mg/dL — AB (ref 65–99)
POTASSIUM: 3.3 mmol/L — AB (ref 3.5–5.1)
SODIUM: 134 mmol/L — AB (ref 135–145)

## 2017-05-09 LAB — HEPATITIS PANEL, ACUTE
HCV AB: 0.1 {s_co_ratio} (ref 0.0–0.9)
HEP A IGM: NEGATIVE
HEP B C IGM: NEGATIVE
Hepatitis B Surface Ag: NEGATIVE

## 2017-05-09 LAB — MAGNESIUM: MAGNESIUM: 2.4 mg/dL (ref 1.7–2.4)

## 2017-05-09 MED ORDER — ENOXAPARIN SODIUM 80 MG/0.8ML ~~LOC~~ SOLN
40.0000 mg | SUBCUTANEOUS | Status: DC
  Administered 2017-05-10: 40 mg via SUBCUTANEOUS
  Filled 2017-05-09: qty 0.8

## 2017-05-09 MED ORDER — POTASSIUM CHLORIDE CRYS ER 20 MEQ PO TBCR
40.0000 meq | EXTENDED_RELEASE_TABLET | Freq: Once | ORAL | Status: AC
  Administered 2017-05-09: 40 meq via ORAL
  Filled 2017-05-09: qty 2

## 2017-05-09 NOTE — Evaluation (Signed)
Physical Therapy Evaluation Patient Details Name: [name] MRN: [id number] DOB: 16-May-1956 Today's Date: 05/09/2017   History of Present Illness  Pt is a 61 year old female s/p Reimplantation of right total knee arthroplasty 04/27/17 with hx of right knee I&D, antibiotic spacer, admitted 05/07/17 with weakness, fever, and SOB. Saphenous vein thrombus, negative DVT RLE.  Clinical Impression  The patient is feeling well. Ambulated x 120'. Not SOB. Pt admitted with above diagnosis. Pt currently with functional limitations due to the deficits listed below (see PT Problem List). Pt will benefit from skilled PT to increase their independence and safety with mobility to allow discharge to the venue listed below.       Follow Up Recommendations Home health PT- as received after last admit.    Equipment Recommendations  None recommended by PT    Recommendations for Other Services       Precautions / Restrictions Precautions Precautions: Fall;Knee      Mobility  Bed Mobility Overal bed mobility: Modified Independent                Transfers   Equipment used: Rolling walker (2 wheeled) Transfers: Sit to/from Stand Sit to Stand: Supervision            Ambulation/Gait Ambulation/Gait assistance: Supervision Ambulation Distance (Feet): 120 Feet Assistive device: Rolling walker (2 wheeled) Gait Pattern/deviations: Step-to pattern;Decreased stance time - right;Antalgic     General Gait Details: distance to tolerance,  Stairs            Wheelchair Mobility    Modified Rankin (Stroke Patients Only)       Balance                                             Pertinent Vitals/Pain Pain Assessment: 0-10 Pain Score: 3  Pain Location: R knee Pain Descriptors / Indicators: Sore;Aching Pain Intervention(s): Monitored during session;Premedicated before session;Ice applied    Home Living Family/patient expects to be discharged to:: Private  residence Living Arrangements: Spouse/significant other Available Help at Discharge: Family Type of Home: House Home Access: Stairs to enter Entrance Stairs-Rails: Right Entrance Stairs-Number of Steps: 5 Home Layout: Two level;Able to live on main level with bedroom/bathroom Home Equipment: Gilford Rile - 2 wheels;Walker - 4 wheels      Prior Function Level of Independence: Independent with assistive device(s)   Gait / Transfers Assistance Needed: assist on steps     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      Hand Dominance        Extremity/Trunk Assessment   Upper Extremity Assessment Upper Extremity Assessment: Overall WFL for tasks assessed    Lower Extremity Assessment Lower Extremity Assessment: RLE deficits/detail RLE Deficits / Details: able to perform SLR with effort, knee flexion 50    Cervical / Trunk Assessment Cervical / Trunk Assessment: Normal  Communication   Communication: No difficulties  Cognition Arousal/Alertness: Awake/alert Behavior During Therapy: WFL for tasks assessed/performed Overall Cognitive Status: Within Functional Limits for tasks assessed                                        General Comments      Exercises Total Joint Exercises Ankle Circles/Pumps: AROM;Both;10 reps Quad Sets: AROM;Both;10 reps  Towel Squeeze: AROM;Both;10 reps Short Arc Quad: AROM;Right;10 reps Heel Slides: AAROM;Right;10 reps Hip ABduction/ADduction: AROM;Right;10 reps Straight Leg Raises: AAROM;Right;10 reps   Assessment/Plan    PT Assessment Patient needs continued PT services  PT Problem List Decreased strength;Decreased range of motion;Decreased mobility;Pain       PT Treatment Interventions DME instruction;Gait training;Functional mobility training;Therapeutic activities;Therapeutic exercise;Patient/family education    PT Goals (Current goals can be found in the Care Plan section)  Acute Rehab PT Goals Patient Stated  Goal: go home PT Goal Formulation: With patient/family Time For Goal Achievement: 05/16/17 Potential to Achieve Goals: Good    Frequency Min 3X/week   Barriers to discharge        Co-evaluation               AM-PAC PT "6 Clicks" Daily Activity  Outcome Measure Difficulty turning over in bed (including adjusting bedclothes, sheets and blankets)?: None Difficulty moving from lying on back to sitting on the side of the bed? : None Difficulty sitting down on and standing up from a chair with arms (e.g., wheelchair, bedside commode, etc,.)?: A Little Help needed moving to and from a bed to chair (including a wheelchair)?: A Little Help needed walking in hospital room?: A Little Help needed climbing 3-5 steps with a railing? : A Little 6 Click Score: 20    End of Session   Activity Tolerance: Patient tolerated treatment well Patient left: in bed;with call bell/phone within reach;with family/visitor present Nurse Communication: Mobility status PT Visit Diagnosis: Other abnormalities of gait and mobility (R26.89)    Time: 2878-6767 PT Time Calculation (min) (ACUTE ONLY): 21 min   Charges:   PT Evaluation $PT Eval Low Complexity: 1 Low     PT G CodesTresa Endo PT [phone]   Claretha Cooper 05/09/2017, 10:03 AM

## 2017-05-09 NOTE — Progress Notes (Signed)
[facility]  Date of Admission:  05/07/2017           Day 13 vancomycin        Day 2 cefepime ASSESSMENT: [name] was admitted after developing chemotherapy-induced, symptomatic anemia with shortness of breath.  She is doing much better after red blood cell transfusion.  She was receiving IV vancomycin at home after her recent redo total knee arthroplasty.  When she was admitted this time she had low-grade fevers but she believes that is due to her chemotherapy that was administered on 05/05/2017.  She has told me on many occasions in the past and that she always has low-grade fevers for the first week after her chemotherapy.  Blood and urine cultures are negative and she has no evidence of pneumonia by exam or chest x-ray.  Cultures from her right knee were negative at the time of her arthroplasty on 04/27/2017.  I doubt that she has any new infection.  If her cultures remain negative overnight I will stop cefepime tomorrow and let her go home to complete her planned 6-week course of IV vancomycin.  PLAN: 1. Continue vancomycin 2. Stop cefepime tomorrow if blood cultures remain negative  Principal Problem:   Symptomatic anemia Active Problems:   S/P total knee arthroplasty, right   Metastatic breast cancer (HCC)   SOB (shortness of breath)   Sepsis (HCC)   Pyogenic arthritis of right knee joint (HCC)   Enterococcal infection   Anemia due to chemotherapy   Abnormal LFTs   Fever   Scheduled Meds: . cholecalciferol  2,000 Units Oral Once per day on Sun Tue Thu Sat  . darifenacin  15 mg Oral Daily  . docusate sodium  100 mg Oral BID  . enoxaparin  70 mg Subcutaneous Q24H  . gabapentin  300 mg Oral TID  . loratadine  10 mg Oral Daily  . multivitamin with minerals  1 tablet Oral Daily  . polyethylene glycol  17 g Oral BID  . sodium chloride flush  10-40 mL Intracatheter Q12H   Continuous Infusions: . ceFEPime (MAXIPIME) IV Stopped (05/09/17 0536)   . vancomycin 750 mg (05/09/17 1025)   PRN Meds:.ibuprofen, levalbuterol, methocarbamol, ondansetron **OR** ondansetron (ZOFRAN) IV, oxyCODONE, sodium chloride flush, zolpidem   SUBJECTIVE: Anthonella is feeling much improved after her transfusion.  Review of Systems: Review of Systems  Constitutional: Positive for fever and malaise/fatigue. Negative for chills and diaphoresis.  Respiratory: Negative for cough, sputum production and shortness of breath.   Cardiovascular: Negative for chest pain.  Gastrointestinal: Negative for diarrhea, nausea and vomiting.  Genitourinary: Negative for dysuria.  Musculoskeletal: Positive for joint pain.    Allergies  Allergen Reactions  . Penicillins Hives, Itching and Rash    Has patient had a PCN reaction causing immediate rash, facial/tongue/throat swelling, SOB or lightheadedness with hypotension: yes Has patient had a PCN reaction causing severe rash involving mucus membranes or skin necrosis: No Has patient had a PCN reaction that required hospitalization No Has patient had a PCN reaction occurring within the last 10 years: yes If all of the above answers are "NO", then may proceed with Cephalosporin use.   Denies airway involvement   . Other Rash    STERI STRIPS - Blisters  . Tape Hives    Can tolerate paper tape    OBJECTIVE: Vitals:   05/08/17 1505 05/08/17 1745 05/08/17 2245 05/09/17 0520  BP: 137/72 (!) 114/55 119/64 (!) 109/56  Pulse: (!) 110 (!) 115 (!) 105 78  Resp: 18 20 18 18   Temp: 98.8 F (37.1 C) 99.6 F (37.6 C) (!) 100.5 F (38.1 C) 97.8 F (36.6 C)  TempSrc: Oral Oral Oral Oral  SpO2: 93% 92% 94% 96%  Weight:    183 lb 9.6 oz (83.3 kg)  Height:       Body mass index is 28.76 kg/m.  Physical Exam  Constitutional: She is oriented to person, place, and time.  She is in good spirits.  Her husband is at the bedside.  Cardiovascular: Normal rate and regular rhythm.  No murmur heard. Pulmonary/Chest: Effort  normal and breath sounds normal.  Port and PICC site looks okay.  Abdominal: Soft. There is no tenderness.  Musculoskeletal:  Right knee has clean dry dressing over her recent surgical incision.  Neurological: She is alert and oriented to person, place, and time.  Skin: No rash noted.  Psychiatric: Mood and affect normal.    Lab Results Lab Results  Component Value Date   WBC 3.7 (L) 05/09/2017   HGB 9.9 (L) 05/09/2017   HCT 30.3 (L) 05/09/2017   MCV 95.6 05/09/2017   PLT 153 05/09/2017    Lab Results  Component Value Date   CREATININE 0.56 05/09/2017   BUN 6 05/09/2017   NA 134 (L) 05/09/2017   K 3.3 (L) 05/09/2017   CL 100 (L) 05/09/2017   CO2 25 05/09/2017    Lab Results  Component Value Date   ALT 21 05/07/2017   AST 65 (H) 05/07/2017   ALKPHOS 118 05/07/2017   BILITOT 2.3 (H) 05/07/2017     Microbiology: Recent Results (from the past 240 hour(s))  Urine Culture     Status: None   Collection Time: 05/07/17  5:21 PM  Result Value Ref Range Status   Specimen Description URINE, CLEAN CATCH  Final   Special Requests NONE  Final   Culture   Final    NO GROWTH Performed at [facility], [place] [address], [place], [place] [address]    Report Status 05/08/2017 FINAL  Final  Blood Culture (routine x 2)     Status: None (Preliminary result)   Collection Time: 05/07/17  5:50 PM  Result Value Ref Range Status   Specimen Description BLOOD RIGHT FOREARM  Final   Special Requests   Final    BOTTLES DRAWN AEROBIC AND ANAEROBIC Blood Culture adequate volume   Culture   Final    NO GROWTH < 24 HOURS Performed at [facility], [place] [address] [place], [place] [address]    Report Status PENDING  Incomplete  Blood Culture (routine x 2)     Status: None (Preliminary result)   Collection Time: 05/07/17  5:55 PM  Result Value Ref Range Status   Specimen Description BLOOD PICC LINE LEFT  Final   Special Requests   Final    BOTTLES DRAWN AEROBIC AND ANAEROBIC Blood  Culture adequate volume   Culture   Final    NO GROWTH < 24 HOURS Performed at [facility], [place] [address] [place], [place] [address]    Report Status PENDING  Incomplete    Michel Bickers, MD [facility] [phone] pager   [phone] cell 05/09/2017, 11:21 AM

## 2017-05-09 NOTE — Progress Notes (Signed)
PROGRESS NOTE    [name]  ENI:[id number] DOB: 1955/08/08 DOA: 05/07/2017 PCP: Midge Minium, MD    Brief Narrative:  [name] is a 61 y.o. 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  Pt states that she has been have SOB recently. She states that her hemoglobin normally is around 11. She states that her oncologist planned to transfuse her with 2 units of blood because her hemoglobin dropped to near 7.0, but not done yet since patient has multiple RBC antibodies. Patient denies chest pain, cough. She states that her shortness of breath is aggravated by exertion. Not tenderness in calf areas. She states that she has fever of 100.2 and chills today.   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 She had hardware removal in July of this year and with prolonged antibiotics. She underwent re-do right TKA by Dr. Alvan Dame on 12/3 with initiation of peri-operative Vancomycin starting 11/30, and PIC line placement 12/6 with continued BID vancomycin, planned for 6 weeks. She states the the pain in her right knee has improved, but still has swelling. Patient does not have nausea, vomiting, diarrhea or abdominal pain. No symptoms of UTI. Patient was seen by orthopedic surgeon today. Due to SOB, she was sent to ED for further evaluation and treatment.  ED Course: pt was found to have wBC 5.6, lactic acid 1.73, urinalysis with trace amount of leukocyte, electrolytes renal function okay, temperature 99.7, tachycardia, tachypnea, negative chest x-ray. His patient is admitted to telemetry bed as inpatient. ED physician will contact orthopedic surgeon.      Assessment & Plan:   Principal Problem:   Symptomatic anemia Active Problems:   Metastatic breast cancer (HCC)   SOB (shortness of  breath)   Sepsis (HCC)   S/P total knee arthroplasty, right   Pyogenic arthritis of right knee joint (HCC)   Enterococcal infection   Anemia due to chemotherapy   Abnormal LFTs   Fever  #1 symptomatic anemia/anemia due to chemotherapy and shortness of breath Presented with shortness of breath felt likely to be a symptomatic anemia.  Baseline hemoglobin around 11.  Patient will drop down to 7.6 on day of admission.  Some concern also for acute CHF exacerbation.  BNP was elevated.  2D echo on 04/24/2017 with a EF of 65-70% with grade 1 diastolic dysfunction.  Patient status post transfusion of 2 unit packed red blood cells.  Patient states shortness of breath has improved post transfusion.  Respiratory exam negative for any crackles.  Lungs seem clear to auscultation.  CT angiogram chest negative for PE or pneumonia/infiltrate.  Continue Xopenex nebs as needed.  Follow closely.  2.  Fever/possible sepsis Questionable etiology.  Patient on admission was noted to have a fever, tachycardia, tachypnea.  Lactic acid level was normal although.  Patient status post recent knee procedure.  Patient on IV antibiotics prior to presentation secondary to a post procedure infection with enterococci.  Patient was on empiric IV vancomycin.  Patient started on IV cefepime.  Blood cultures pending.  Due to patient's complicated history, recent history of right knee joint enterococcal infection ID and orthopedics were consulted.  ID recommended continuing current IV antibiotics while blood cultures pending.  Orthopedics assessed the patient and recommended outpatient follow-up.    3.  Abnormal LFTs Acute hepatitis panel negative. Follow.  4. Bilateral lower extremity edema right greater than left Likely secondary to recent right joint infection/recent right knee surgery.  Lower extremity Dopplers done negative for DVT.  Evidence of superficial vein thrombosis involving right greater saphenous vein at the mid calf.   Negative for Baker's cyst bilaterally.  Patient has been seen by orthopedics, Dr.Olin who recommends outpatient follow-up.       DVT prophylaxis: Lovenox Code Status: Full Family Communication: Updated patient.  No family at bedside.  Disposition Plan: To be Determined.   Consultants:   Infectious disease: Dr. Baxter Flattery 05/08/2017  Orthopedics: Dr. Alvan Dame 05/09/2017  Procedures:   CT angiogram chest 05/07/2017  Chest x-ray 05/07/2017  Lower extremity Dopplers 05/08/2017  2 units packed red blood cells 05/08/2017  Antimicrobials:   IV vancomycin 05/07/2017  IV cefepime 05/07/2017   Subjective: Patient sitting up in bed eating.  Shortness of breath improved and feels she is close to her baseline.  No chest pain.  Objective: Vitals:   05/08/17 1505 05/08/17 1745 05/08/17 2245 05/09/17 0520  BP: 137/72 (!) 114/55 119/64 (!) 109/56  Pulse: (!) 110 (!) 115 (!) 105 78  Resp: 18 20 18 18   Temp: 98.8 F (37.1 C) 99.6 F (37.6 C) (!) 100.5 F (38.1 C) 97.8 F (36.6 C)  TempSrc: Oral Oral Oral Oral  SpO2: 93% 92% 94% 96%  Weight:    83.3 kg (183 lb 9.6 oz)  Height:        Intake/Output Summary (Last 24 hours) at 05/09/2017 1309 Last data filed at 05/08/2017 1753 Gross per 24 hour  Intake 400 ml  Output -  Net 400 ml   Filed Weights   05/07/17 1635 05/07/17 2255 05/09/17 0520  Weight: 82 kg (180 lb 12.4 oz) 83.8 kg (184 lb 11.9 oz) 83.3 kg (183 lb 9.6 oz)    Examination:  General exam: NAD. Respiratory system: Clear to auscultation.  No wheezes, no crackles, no rhonchi.  Respiratory effort normal. Cardiovascular system: Regular rate and rhythm no murmurs rubs or gallops.  No JVD. Bilateral lower extremity edema right greater than left. Gastrointestinal system: Abdomen is nondistended, soft and nontender. No organomegaly or masses felt. Normal bowel sounds heard. Central nervous system: Alert and oriented. No focal neurological deficits. Extremities: Right knee  with swelling, slight erythema, some tenderness to palpation, 2+ edema.  Skin: No rashes, lesions or ulcers Psychiatry: Judgement and insight appear normal. Mood & affect appropriate.     Data Reviewed: I have personally reviewed following labs and imaging studies  CBC: Recent Labs  Lab 05/07/17 1756 05/08/17 0806 05/09/17 0920  WBC 5.6 3.6* 3.7*  NEUTROABS 4.4  --  2.5  HGB 7.6* 8.3* 9.9*  HCT 23.6* 26.2* 30.3*  MCV 100.9* 98.1 95.6  PLT 155 149* 299   Basic Metabolic Panel: Recent Labs  Lab 05/07/17 1756 05/08/17 0806 05/08/17 1029 05/09/17 0920  NA 132* 135 136 134*  K 3.9 3.2* 2.9* 3.3*  CL 99* 102 102 100*  CO2 24 26 25 25   GLUCOSE 103* 112* 118* 125*  BUN 8 7 7 6   CREATININE 0.60 0.60 0.68 0.56  CALCIUM 8.9 8.3* 8.3* 8.1*  MG  --   --  1.7 2.4   GFR: Estimated Creatinine Clearance: 83 mL/min (by C-G formula based on SCr of 0.56 mg/dL). Liver Function Tests: Recent Labs  Lab 05/07/17 1756  AST 65*  ALT 21  ALKPHOS 118  BILITOT 2.3*  PROT 7.0  ALBUMIN 3.2*   No results for  input(s): LIPASE, AMYLASE in the last 168 hours. No results for input(s): AMMONIA in the last 168 hours. Coagulation Profile: No results for input(s): INR, PROTIME in the last 168 hours. Cardiac Enzymes: No results for input(s): CKTOTAL, CKMB, CKMBINDEX, TROPONINI in the last 168 hours. BNP (last 3 results) No results for input(s): PROBNP in the last 8760 hours. HbA1C: No results for input(s): HGBA1C in the last 72 hours. CBG: No results for input(s): GLUCAP in the last 168 hours. Lipid Profile: No results for input(s): CHOL, HDL, LDLCALC, TRIG, CHOLHDL, LDLDIRECT in the last 72 hours. Thyroid Function Tests: No results for input(s): TSH, T4TOTAL, FREET4, T3FREE, THYROIDAB in the last 72 hours. Anemia Panel: No results for input(s): VITAMINB12, FOLATE, FERRITIN, TIBC, IRON, RETICCTPCT in the last 72 hours. Sepsis Labs: Recent Labs  Lab 05/07/17 1805 05/07/17 2345    PROCALCITON  --  0.20  LATICACIDVEN 1.73 1.2    Recent Results (from the past 240 hour(s))  Urine Culture     Status: None   Collection Time: 05/07/17  5:21 PM  Result Value Ref Range Status   Specimen Description URINE, CLEAN CATCH  Final   Special Requests NONE  Final   Culture   Final    NO GROWTH Performed at [facility], [place] [address], [place], [place] [address]    Report Status 05/08/2017 FINAL  Final  Blood Culture (routine x 2)     Status: None (Preliminary result)   Collection Time: 05/07/17  5:50 PM  Result Value Ref Range Status   Specimen Description BLOOD RIGHT FOREARM  Final   Special Requests   Final    BOTTLES DRAWN AEROBIC AND ANAEROBIC Blood Culture adequate volume   Culture   Final    NO GROWTH < 24 HOURS Performed at [facility], [place] [address], [place], [place] [address]    Report Status PENDING  Incomplete  Blood Culture (routine x 2)     Status: None (Preliminary result)   Collection Time: 05/07/17  5:55 PM  Result Value Ref Range Status   Specimen Description BLOOD PICC LINE LEFT  Final   Special Requests   Final    BOTTLES DRAWN AEROBIC AND ANAEROBIC Blood Culture adequate volume   Culture   Final    NO GROWTH < 24 HOURS Performed at [facility], [place] [address], [place], [place] [address]    Report Status PENDING  Incomplete         Radiology Studies: Dg Chest 2 View  Result Date: 05/07/2017 CLINICAL DATA:  Shortness of breath beginning today. Currently undergoing chemotherapy for breast carcinoma. EXAM: CHEST  2 VIEW COMPARISON:  12/12/2016 and 05/10/2016 FINDINGS: New left arm PICC line is seen with tip overlying the superior cavoatrial junction. Right-sided power port remains in appropriate position. Mild cardiomegaly is stable. No evidence of pulmonary infiltrate or pleural effusion. Elevation of left hilum is again seen with dilatation of proximal pulmonary arteries. These findings are stable and suspicious for  pulmonary arterial hypertension. Patient has undergone previous bilateral mastectomies with axillary lymph node dissections. IMPRESSION: No acute findings. Stable mild cardiomegaly, and dilatation of proximal pulmonary arteries suspicious for pulmonary arterial hypertension. Electronically Signed   By: Earle Gell M.D.   On: 05/07/2017 18:10   Ct Angio Chest Pe W And/or Wo Contrast  Result Date: 05/07/2017 CLINICAL DATA:  Three-day history of shortness of breath on exertion, tachypnea and fever. Patient underwent right total knee arthroplasty on 04/27/2017. Personal  history of left breast cancer in 2009. EXAM: CT ANGIOGRAPHY CHEST WITH CONTRAST TECHNIQUE: Multidetector CT imaging of the chest was performed using the standard protocol during bolus administration of intravenous contrast. Multiplanar CT image reconstructions and MIPs were obtained to evaluate the vascular anatomy. CONTRAST:  167mL [id number] IOPAMIDOL INJECTION 76% IV. COMPARISON:  No prior CTA chest.  PET-CT 05/30/2008 is correlated. FINDINGS: Cardiovascular: Contrast opacification of the pulmonary arteries is very good. Respiratory motion blurred images of the mid lungs and the lung bases. Overall, the study is of moderate diagnostic quality. No filling defects within either main pulmonary artery or their proximal segmental branches in either lung to suggest pulmonary embolism. Main pulmonary trunk mildly enlarged at approximately 4.0 cm diameter. Heart moderately enlarged with right atrial enlargement in particular. Mild LAD coronary atherosclerosis. No significant pericardial effusion. No visible aortic atherosclerosis. Right subclavian Port-A-Cath and left arm PICC have their tips in the lower SVC at or near the cavoatrial junction. Mediastinum/Nodes: No pathologically enlarged mediastinal, hilar or axillary lymph nodes. Prior left axillary node dissection. No mediastinal masses. Normal-appearing esophagus. Thyroid gland normal in appearance.  Lungs/Pleura: Scar and bronchiectasis involving the left upper lobe anteriorly with associated marked volume loss, progressive since the prior PET-CT in 2010. Mild dependent atelectasis posteriorly in the lower lobes. Lungs otherwise clear. No confluent airspace consolidation. No evidence of interstitial lung disease. Central airways patent without significant bronchial wall thickening. No pleural effusions. Upper Abdomen: Possible splenomegaly, though the spleen is completely imaged. Visualized upper abdomen otherwise unremarkable for the early arterial phase of enhancement. Musculoskeletal: Exaggeration of the usual thoracic kyphosis. Degenerative disc disease and spondylosis involving the mid and lower thoracic spine. No acute osseous abnormalities. Other: Prior left mastectomy. No evidence of recurrent tumor in the left chest wall. Review of the MIP images confirms the above findings. IMPRESSION: 1. No evidence of central pulmonary embolism. 2. Mild dependent atelectasis in the lower lobes. No acute cardiopulmonary disease otherwise. Progressive scarring and bronchiectasis involving the left upper lobe, likely related to the prior radiation therapy at the time of breast cancer treatment. 3. Cardiomegaly with right atrial enlargement in particular. 4. Possible pulmonary arterial hypertension, as the main pulmonary trunk is mildly enlarged at 4.0 cm diameter. 5. Mild LAD coronary artery atherosclerosis. No visible aortic atherosclerosis. 6. Possible splenomegaly, though the spleen is incompletely imaged. Electronically Signed   By: Evangeline Dakin M.D.   On: 05/07/2017 20:05        Scheduled Meds: . cholecalciferol  2,000 Units Oral Once per day on Sun Tue Thu Sat  . darifenacin  15 mg Oral Daily  . docusate sodium  100 mg Oral BID  . enoxaparin  70 mg Subcutaneous Q24H  . gabapentin  300 mg Oral TID  . loratadine  10 mg Oral Daily  . multivitamin with minerals  1 tablet Oral Daily  . polyethylene  glycol  17 g Oral BID  . potassium chloride  40 mEq Oral Once  . sodium chloride flush  10-40 mL Intracatheter Q12H   Continuous Infusions: . ceFEPime (MAXIPIME) IV Stopped (05/09/17 0536)  . vancomycin 750 mg (05/09/17 1025)     LOS: 2 days    Time spent: 40 minutes    Irine Seal, MD [facility] Pager [phone]  If 7PM-7AM, please contact night-coverage [email or web address] Password [email or web address] 05/09/2017, 1:09 PM

## 2017-05-10 LAB — TYPE AND SCREEN
ABO/RH(D): A POS
Antibody Screen: POSITIVE
DONOR AG TYPE: NEGATIVE
Donor AG Type: NEGATIVE
UNIT DIVISION: 0
UNIT DIVISION: 0

## 2017-05-10 LAB — CBC
HCT: 27.4 % — ABNORMAL LOW (ref 36.0–46.0)
Hemoglobin: 9 g/dL — ABNORMAL LOW (ref 12.0–15.0)
MCH: 31.6 pg (ref 26.0–34.0)
MCHC: 32.8 g/dL (ref 30.0–36.0)
MCV: 96.1 fL (ref 78.0–100.0)
PLATELETS: 155 10*3/uL (ref 150–400)
RBC: 2.85 MIL/uL — AB (ref 3.87–5.11)
RDW: 20.6 % — AB (ref 11.5–15.5)
WBC: 3.7 10*3/uL — AB (ref 4.0–10.5)

## 2017-05-10 LAB — BASIC METABOLIC PANEL
ANION GAP: 6 (ref 5–15)
BUN: 8 mg/dL (ref 6–20)
CALCIUM: 8.1 mg/dL — AB (ref 8.9–10.3)
CO2: 27 mmol/L (ref 22–32)
Chloride: 103 mmol/L (ref 101–111)
Creatinine, Ser: 0.48 mg/dL (ref 0.44–1.00)
GFR calc Af Amer: >60 mL/min (ref >60)
GLUCOSE: 92 mg/dL (ref 65–99)
POTASSIUM: 3.6 mmol/L (ref 3.5–5.1)
SODIUM: 136 mmol/L (ref 135–145)

## 2017-05-10 LAB — BPAM RBC
Blood Product Expiration Date: 201901092359
Blood Product Expiration Date: 201901142359
ISSUE DATE / TIME: 201812141437
ISSUE DATE / TIME: 201812140246
UNIT TYPE AND RH: 5100
Unit Type and Rh: 5100

## 2017-05-10 LAB — VANCOMYCIN, TROUGH: VANCOMYCIN TR: 14 ug/mL — AB (ref 15–20)

## 2017-05-10 MED ORDER — VANCOMYCIN IV (FOR PTA / DISCHARGE USE ONLY)
750.0000 mg | Freq: Two times a day (BID) | INTRAVENOUS | 0 refills | Status: AC
Start: 2017-05-10 — End: 2017-06-09

## 2017-05-10 MED ORDER — HEPARIN SOD (PORK) LOCK FLUSH 100 UNIT/ML IV SOLN
250.0000 [IU] | INTRAVENOUS | Status: AC | PRN
  Administered 2017-05-10: 250 [IU]

## 2017-05-10 MED ORDER — FERROUS SULFATE 325 (65 FE) MG PO TABS: 325.0000 mg | ORAL_TABLET | Freq: Three times a day (TID) | ORAL | 1 refills | Status: DC

## 2017-05-10 NOTE — Progress Notes (Signed)
NCM contacted pharmacy for OPAT. Order complete for IV abx. Contacted [facility] to make aware of resumption of [place] RN for IV abx. Pt states she has medication at home. 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 Jonnie Finner RN CCM Case Mgmt phone [phone]

## 2017-05-10 NOTE — Progress Notes (Signed)
PHARMACY CONSULT NOTE FOR:  OUTPATIENT  PARENTERAL ANTIBIOTIC THERAPY (OPAT)  Indication: prosthetic joint infection Regimen: vancomycin 750 mg IV q12h End date: 06/09/17   IV antibiotic discharge orders are pended. To discharging provider:  please sign these orders via discharge navigator,  Select New Orders & click on the button choice - Manage This Unsigned Work.     Thank you for allowing pharmacy to be a part of this patient's care.  Lynelle Doctor 05/10/2017, 2:47 PM

## 2017-05-10 NOTE — Discharge Summary (Signed)
Physician Discharge Summary  [name] DPO:[id number] DOB: [birth year] DOA: 05/07/2017  PCP: Midge Minium, MD  Admit date: 05/07/2017 Discharge date: 05/10/2017  Time spent: 60 minutes  Recommendations for Outpatient Follow-up:  1. Patient will be discharged home to continue IV antibiotics at home.  2 weeks postoperatively as planned with home health therapies. 2. Follow-up with Dr. Megan Salon, ID.  Office will call with appointment time. 3. Follow-up with Dr. Alvan Dame next week as scheduled.   Discharge Diagnoses:  Principal Problem:   Symptomatic anemia Active Problems:   Metastatic breast cancer (HCC)   SOB (shortness of breath)   Sepsis (HCC)   S/P total knee arthroplasty, right   Pyogenic arthritis of right knee joint (HCC)   Enterococcal infection   Anemia due to chemotherapy   Abnormal LFTs   Fever   Discharge Condition: Stable and improved  Diet recommendation: Heart healthy  Filed Weights   05/07/17 2255 05/09/17 0520 05/10/17 0538  Weight: 83.8 kg (184 lb 11.9 oz) 83.3 kg (183 lb 9.6 oz) 83.3 kg (183 lb 9.6 oz)    History of present illness:  Per Dr.Niu [name] is a 61 y.o. 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  Pt stated that she had been have SOB recently. She stated that her hemoglobin normally is around 11. She stated that her oncologist planned to transfuse her with 2 units of blood because her hemoglobin dropped to near 7.0, but not done yet since patient has multiple RBC antibodies. Patient denied chest pain, cough. She stated that her shortness of breath was aggravated by exertion. Not tenderness in calf areas. She stated that she had fever of 100.2 and chills on the day of admission.   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 She  had hardware removal in July of this year and with prolonged antibiotics. She underwent re-do right TKA by Dr. Alvan Dame on 12/3 with initiation of peri-operative Vancomycin starting 11/30, and PICC line placement 12/6 with continued BID vancomycin, planned for 6 weeks. She stated that the pain in her right knee had improved, but still had swelling. Patient did not have nausea, vomiting, diarrhea or abdominal pain. No symptoms of UTI. Patient was seen by orthopedic surgeon today. Due to SOB, she was sent to ED for further evaluation and treatment.  ED Course: pt was found to have wBC 5.6, lactic acid 1.73, urinalysis with trace amount of leukocyte, electrolytes renal function okay, temperature 99.7, tachycardia, tachypnea, negative chest x-ray. His patient is admitted to telemetry bed as inpatient. ED physician was to contact orthopedic surgeon.     Hospital Course:  #1 symptomatic anemia/anemia due to chemotherapy and shortness of breath Presented with shortness of breath felt likely to be a symptomatic anemia.  Baseline hemoglobin around 11.  Patient with drop down of hemoglobin to 7.6 on day of admission.  Some concern also for acute CHF exacerbation.  BNP was elevated.  2D echo on 04/24/2017 with a EF of 65-70% with grade 1 diastolic dysfunction.  Patient status post transfusion of 2 unit packed red blood cells.  Patient stated shortness of breath had improved post transfusion.  Respiratory exam negative for any crackles.  Lungs clear to auscultation.  CT angiogram chest negative for PE or pneumonia/infiltrate.  Patient was maintained on Xopenex nebs as needed.  Patient had  no further shortness of breath and ambulated in the hallway without shortness of breath.  Patient will be discharged home in stable and improved condition.  2.  Fever/possible sepsis Questionable etiology.  Patient on admission was noted to have a fever, tachycardia, tachypnea.  Lactic acid level was normal although.  Patient status  post recent knee procedure.  Patient on IV antibiotics prior to presentation secondary to a post procedure infection with enterococci.  Patient was on empiric IV vancomycin.  Patient started on IV cefepime.  Blood cultures pending with no growth to date.  Due to patient's complicated history, recent history of right knee joint enterococcal infection ID and orthopedics were consulted.  ID recommended continuing current IV antibiotics while blood cultures pending.  Orthopedics assessed the patient and recommended outpatient follow-up.  On day of discharge patient was noted to have a temperature of 101.7 the night prior to discharge however had no evidence of new or active infection by chest x-ray cultures and exam.  Patient desperately wanted to go home.  Patient was assessed by ID and he felt it was okay for patient to be discharged home with close follow-up in ID clinic.  Patient was discharged in stable condition.   3.  Abnormal LFTs Acute hepatitis panel negative.   Outpatient follow-up.    4. Bilateral lower extremity edema right greater than left Likely secondary to recent right joint infection/recent right knee surgery.  Lower extremity Dopplers done negative for DVT.  Evidence of superficial vein thrombosis involving right greater saphenous vein at the mid calf.  Negative for Baker's cyst bilaterally.  Patient was seen by orthopedics, Dr.Olin who recommended outpatient follow-up.       Procedures:  CT angiogram chest 05/07/2017  Chest x-ray 05/07/2017  Lower extremity Dopplers 05/08/2017  2 units packed red blood cells 05/08/2017      Consultations:  Infectious disease: Dr. Baxter Flattery 05/08/2017  Orthopedics: Dr. Alvan Dame 05/09/2017      Discharge Exam: Vitals:   05/09/17 2237 05/10/17 0538  BP:  108/66  Pulse:  83  Resp:  18  Temp: 99.6 F (37.6 C) 97.7 F (36.5 C)  SpO2:  94%    General: NAD Cardiovascular: RRR Respiratory: CTAB  Discharge  Instructions   Discharge Instructions    Diet - low sodium heart healthy   Complete by:  As directed    Home infusion instructions [facility] May follow [place] Dosing Protocol; May administer Cathflo as needed to maintain patency of vascular access device.; Flushing of vascular access device: per [facility] Protocol: 0.9% NaCl pre/post medica...   Complete by:  As directed    Instructions:  May follow [place] Dosing Protocol   Instructions:  May administer Cathflo as needed to maintain patency of vascular access device.   Instructions:  Flushing of vascular access device: per [facility] Protocol: 0.9% NaCl pre/post medication administration and prn patency; Heparin 100 u/ml, 5m for implanted ports and Heparin 10u/ml, 568mfor all other central venous catheters.   Instructions:  May follow [facility] Anaphylaxis Protocol for First Dose Administration in the home: 0.9% NaCl at 25-50 ml/hr to maintain IV access for protocol meds. Epinephrine 0.3 ml IV/IM PRN and Benadryl 25-50 IV/IM PRN s/s of anaphylaxis.   Instructions:  [facility] Coordinator (RN) to assist per patient IV care needs in the home PRN.   Increase activity slowly   Complete by:  As directed      Allergies as of 05/10/2017  Reactions   Penicillins Hives, Itching, Rash   Has patient had a PCN reaction causing immediate rash, facial/tongue/throat swelling, SOB or lightheadedness with hypotension: yes Has patient had a PCN reaction causing severe rash involving mucus membranes or skin necrosis: No Has patient had a PCN reaction that required hospitalization No Has patient had a PCN reaction occurring within the last 10 years: yes If all of the above answers are "NO", then may proceed with Cephalosporin use. Denies airway involvement   Other Rash   STERI STRIPS - Blisters   Tape Hives   Can tolerate paper tape      Medication List    TAKE these medications   celecoxib 200 MG capsule Commonly known as:   CELEBREX Take 1 capsule (200 mg total) by mouth every 12 (twelve) hours.   cetirizine 10 MG tablet Commonly known as:  ZYRTEC Take 1 tablet (10 mg total) daily by mouth.   darifenacin 15 MG 24 hr tablet Commonly known as:  ENABLEX Take 15 mg by mouth daily.   docusate sodium 100 MG capsule Commonly known as:  COLACE Take 1 capsule (100 mg total) by mouth 2 (two) times daily. What changed:  Another medication with the same name was removed. Continue taking this medication, and follow the directions you see here.   enoxaparin 80 MG/0.8ML injection Commonly known as:  LOVENOX Inject 0.4 mLs (40 mg total) into the skin daily.   ferrous sulfate 325 (65 FE) MG tablet Commonly known as:  FERROUSUL Take 1 tablet (325 mg total) by mouth 3 (three) times daily with meals.   furosemide 20 MG tablet Commonly known as:  LASIX Take 1 tablet (20 mg total) by mouth daily as needed for edema.   gabapentin 300 MG capsule Commonly known as:  NEURONTIN TAKE 1 CAPSULE BY MOUTH 3 TIMES DAILY   KADCYLA IV Inject into the vein every 21 ( twenty-one) days. At [facility] next treatment due 02-10-17   methocarbamol 500 MG tablet Commonly known as:  ROBAXIN Take 1 tablet (500 mg total) by mouth every 6 (six) hours as needed for muscle spasms.   multivitamin with minerals Tabs tablet Take 1 tablet by mouth daily.   ondansetron 8 MG tablet Commonly known as:  ZOFRAN Take 1 tablet (8 mg total) by mouth every 8 (eight) hours as needed for nausea. What changed:    reasons to take this  additional instructions   oxyCODONE 5 MG immediate release tablet Commonly known as:  Oxy IR/ROXICODONE Take 1-3 tablets (5-15 mg total) by mouth every 4 (four) hours as needed for severe pain.   polyethylene glycol packet Commonly known as:  MIRALAX / GLYCOLAX Take 17 g by mouth 2 (two) times daily.   vancomycin IVPB Inject 750 mg into the vein every 12 (twelve) hours. Indication:  Prosthetic joint infection Last  Day of Therapy:  06/09/17 Labs - Thursdays:  CBC/D and vancomycin trough Labs - Monday/Thursday:  BMP What changed:  Another medication with the same name was added. Make sure you understand how and when to take each.   vancomycin IVPB Inject 750 mg into the vein every 12 (twelve) hours. Indication:  Prosthetic joint infection Last Day of Therapy:  06/09/17 Labs - Monday:  CBC/D, BMP, and vancomycin trough. Labs - Every other week:  ESR and CRP What changed:  You were already taking a medication with the same name, and this prescription was added. Make sure you understand how and when to take each.  Vitamin D3 2000 units Tabs Take 1 tablet by mouth 4 (four) times a week.   zolpidem 5 MG tablet Commonly known as:  AMBIEN TAKE 1 TABLET BY MOUTH ONCE DAILY AT BEDTIME AS NEEDED FOR SLEEP            Home Infusion Instuctions  (From admission, onward)        Start     Ordered   05/10/17 0000  Home infusion instructions [facility] May follow [place] Dosing Protocol; May administer Cathflo as needed to maintain patency of vascular access device.; Flushing of vascular access device: per [facility] Protocol: 0.9% NaCl pre/post medica...    Question Answer Comment  Instructions May follow [place] Dosing Protocol   Instructions May administer Cathflo as needed to maintain patency of vascular access device.   Instructions Flushing of vascular access device: per [facility] Protocol: 0.9% NaCl pre/post medication administration and prn patency; Heparin 100 u/ml, 43m for implanted ports and Heparin 10u/ml, 556mfor all other central venous catheters.   Instructions May follow [facility] Anaphylaxis Protocol for First Dose Administration in the home: 0.9% NaCl at 25-50 ml/hr to maintain IV access for protocol meds. Epinephrine 0.3 ml IV/IM PRN and Benadryl 25-50 IV/IM PRN s/s of anaphylaxis.   Instructions [facility] Infusion Coordinator (RN) to assist per patient IV care needs in the home PRN.       05/10/17 1520     Allergies  Allergen Reactions  . Penicillins Hives, Itching and Rash    Has patient had a PCN reaction causing immediate rash, facial/tongue/throat swelling, SOB or lightheadedness with hypotension: yes Has patient had a PCN reaction causing severe rash involving mucus membranes or skin necrosis: No Has patient had a PCN reaction that required hospitalization No Has patient had a PCN reaction occurring within the last 10 years: yes If all of the above answers are "NO", then may proceed with Cephalosporin use.   Denies airway involvement   . Other Rash    STERI STRIPS - Blisters  . Tape Hives    Can tolerate paper tape   Follow-up Information    OlParalee CancelMD Follow up in 1 week(s).   Specialty:  Orthopedic Surgery Why:  for wound check and follow up for right knee reimplant Contact information: [phone]      CaMichel BickersMD Follow up.   Specialty:  Infectious Diseases Why:  office will call with appointment time Contact information: [address] [place] [phone]        Health, [facility]-Home Follow up.   Specialty:  [place]:  Home Health RN- agency will call to schedule initial  Contact information: [address] [place] [phone]          The results of significant diagnostics from this hospitalization (including imaging, microbiology, ancillary and laboratory) are listed below for reference.    Significant Diagnostic Studies: Dg Chest 2 View  Result Date: 05/07/2017 CLINICAL DATA:  Shortness of breath beginning today. Currently undergoing chemotherapy for breast carcinoma. EXAM: CHEST  2 VIEW COMPARISON:  12/12/2016 and 05/10/2016 FINDINGS: New left arm PICC line is seen with tip overlying the superior cavoatrial junction. Right-sided power port remains in appropriate position.  Mild cardiomegaly is stable. No evidence of pulmonary infiltrate or pleural effusion. Elevation of left hilum is again seen with dilatation of proximal pulmonary arteries.  These findings are stable and suspicious for pulmonary arterial hypertension. Patient has undergone previous bilateral mastectomies with axillary lymph node dissections. IMPRESSION: No acute findings. Stable mild cardiomegaly, and dilatation of proximal pulmonary arteries suspicious for pulmonary arterial hypertension. Electronically Signed   By: Earle Gell M.D.   On: 05/07/2017 18:10   Ct Angio Chest Pe W And/or Wo Contrast  Result Date: 05/07/2017 CLINICAL DATA:  Three-day history of shortness of breath on exertion, tachypnea and fever. Patient underwent right total knee arthroplasty on 04/27/2017. Personal history of left breast cancer in 2009. EXAM: CT ANGIOGRAPHY CHEST WITH CONTRAST TECHNIQUE: Multidetector CT imaging of the chest was performed using the standard protocol during bolus administration of intravenous contrast. Multiplanar CT image reconstructions and MIPs were obtained to evaluate the vascular anatomy. CONTRAST:  144m ISOVUE-370 IOPAMIDOL INJECTION 76% IV. COMPARISON:  No prior CTA chest.  PET-CT 05/30/2008 is correlated. FINDINGS: Cardiovascular: Contrast opacification of the pulmonary arteries is very good. Respiratory motion blurred images of the mid lungs and the lung bases. Overall, the study is of moderate diagnostic quality. No filling defects within either main pulmonary artery or their proximal segmental branches in either lung to suggest pulmonary embolism. Main pulmonary trunk mildly enlarged at approximately 4.0 cm diameter. Heart moderately enlarged with right atrial enlargement in particular. Mild LAD coronary atherosclerosis. No significant pericardial effusion. No visible aortic atherosclerosis. Right subclavian Port-A-Cath and left arm PICC have their tips in the lower SVC at or near the cavoatrial  junction. Mediastinum/Nodes: No pathologically enlarged mediastinal, hilar or axillary lymph nodes. Prior left axillary node dissection. No mediastinal masses. Normal-appearing esophagus. Thyroid gland normal in appearance. Lungs/Pleura: Scar and bronchiectasis involving the left upper lobe anteriorly with associated marked volume loss, progressive since the prior PET-CT in 2010. Mild dependent atelectasis posteriorly in the lower lobes. Lungs otherwise clear. No confluent airspace consolidation. No evidence of interstitial lung disease. Central airways patent without significant bronchial wall thickening. No pleural effusions. Upper Abdomen: Possible splenomegaly, though the spleen is completely imaged. Visualized upper abdomen otherwise unremarkable for the early arterial phase of enhancement. Musculoskeletal: Exaggeration of the usual thoracic kyphosis. Degenerative disc disease and spondylosis involving the mid and lower thoracic spine. No acute osseous abnormalities. Other: Prior left mastectomy. No evidence of recurrent tumor in the left chest wall. Review of the MIP images confirms the above findings. IMPRESSION: 1. No evidence of central pulmonary embolism. 2. Mild dependent atelectasis in the lower lobes. No acute cardiopulmonary disease otherwise. Progressive scarring and bronchiectasis involving the left upper lobe, likely related to the prior radiation therapy at the time of breast cancer treatment. 3. Cardiomegaly with right atrial enlargement in particular. 4. Possible pulmonary arterial hypertension, as the main pulmonary trunk is mildly enlarged at 4.0 cm diameter. 5. Mild LAD coronary artery atherosclerosis. No visible aortic atherosclerosis. 6. Possible splenomegaly, though the spleen is incompletely imaged. Electronically Signed   By: TEvangeline DakinM.D.   On: 05/07/2017 20:05   Ir Fluoro Guide Cv Line Left  Result Date: 04/30/2017 INDICATION: Patient with prior history of breast cancer and  bilateral mastectomies along with right chest Port-A-Cath which is currently not being utilized. Patient now has a right knee prosthetic joint infection and request received for central venous catheter placement for outpatient antibiotic therapy. Patient currently on chronic Lovenox therapy which makes risk for upper extremity thromboembolic complications lower with PICC placement. Possible risks and complications of procedure were discussed with patient by Dr. MLaurence Ferrari(IR) and patient agrees to proceed with  PICC placement. EXAM: LEFT UPPER EXTREMITY PICC LINE PLACEMENT WITH ULTRASOUND AND FLUOROSCOPIC GUIDANCE MEDICATIONS: 1% lidocaine to skin and subcutaneous tissue ANESTHESIA/SEDATION: None FLUOROSCOPY TIME:  Fluoroscopy Time:  42 seconds (2 mGy). COMPLICATIONS: None immediate. PROCEDURE: The patient was advised of the possible risks and complications and agreed to undergo the procedure. The patient was then brought to the angiographic suite for the procedure. The left arm was prepped with chlorhexidine, draped in the usual sterile fashion using maximum barrier technique (cap and mask, sterile gown, sterile gloves, large sterile sheet, hand hygiene and cutaneous antisepsis) and infiltrated locally with 1% Lidocaine. Ultrasound demonstrated patency of the left basilic vein, and this was documented with an image. Under real-time ultrasound guidance, this vein was accessed with a 21 gauge micropuncture needle and image documentation was performed. A 0.018 wire was introduced in to the vein. Over this, a 5 [place] single lumen power-injectable PICC was advanced to the lower SVC/right atrial junction. Fluoroscopy during the procedure and fluoro spot radiograph confirms appropriate catheter position. The catheter was flushed and covered with a sterile dressing. Catheter length:  43 cm IMPRESSION: Successful left arm Power PICC line placement with ultrasound and fluoroscopic guidance. The catheter is ready for use.  Read by: Rowe Robert, PA-C Electronically Signed   By: Jacqulynn Cadet M.D.   On: 04/30/2017 17:01   Ir US Guide Vasc Access Left  Result Date: 04/30/2017 INDICATION: Patient with prior history of breast cancer and bilateral mastectomies along with right chest Port-A-Cath which is currently not being utilized. Patient now has a right knee prosthetic joint infection and request received for central venous catheter placement for outpatient antibiotic therapy. Patient currently on chronic Lovenox therapy which makes risk for upper extremity thromboembolic complications lower with PICC placement. Possible risks and complications of procedure were discussed with patient by Dr. Laurence Ferrari (IR) and patient agrees to proceed with PICC placement. EXAM: LEFT UPPER EXTREMITY PICC LINE PLACEMENT WITH ULTRASOUND AND FLUOROSCOPIC GUIDANCE MEDICATIONS: 1% lidocaine to skin and subcutaneous tissue ANESTHESIA/SEDATION: None FLUOROSCOPY TIME:  Fluoroscopy Time:  42 seconds (2 mGy). COMPLICATIONS: None immediate. PROCEDURE: The patient was advised of the possible risks and complications and agreed to undergo the procedure. The patient was then brought to the angiographic suite for the procedure. The left arm was prepped with chlorhexidine, draped in the usual sterile fashion using maximum barrier technique (cap and mask, sterile gown, sterile gloves, large sterile sheet, hand hygiene and cutaneous antisepsis) and infiltrated locally with 1% Lidocaine. Ultrasound demonstrated patency of the left basilic vein, and this was documented with an image. Under real-time ultrasound guidance, this vein was accessed with a 21 gauge micropuncture needle and image documentation was performed. A 0.018 wire was introduced in to the vein. Over this, a 5 [place] single lumen power-injectable PICC was advanced to the lower SVC/right atrial junction. Fluoroscopy during the procedure and fluoro spot radiograph confirms appropriate catheter  position. The catheter was flushed and covered with a sterile dressing. Catheter length:  43 cm IMPRESSION: Successful left arm Power PICC line placement with ultrasound and fluoroscopic guidance. The catheter is ready for use. Read by: Rowe Robert, PA-C Electronically Signed   By: Jacqulynn Cadet M.D.   On: 04/30/2017 17:01    Microbiology: Recent Results (from the past 240 hour(s))  Urine Culture     Status: None   Collection Time: 05/07/17  5:21 PM  Result Value Ref Range Status   Specimen Description URINE, CLEAN CATCH  Final   Special Requests  NONE  Final   Culture   Final    NO GROWTH Performed at [facility], [place] [address], [place], [place] [address]    Report Status 05/08/2017 FINAL  Final  Blood Culture (routine x 2)     Status: None (Preliminary result)   Collection Time: 05/07/17  5:50 PM  Result Value Ref Range Status   Specimen Description BLOOD RIGHT FOREARM  Final   Special Requests   Final    BOTTLES DRAWN AEROBIC AND ANAEROBIC Blood Culture adequate volume   Culture   Final    NO GROWTH 3 DAYS Performed at [facility], [place] [address], [place], [place] [address]    Report Status PENDING  Incomplete  Blood Culture (routine x 2)     Status: None (Preliminary result)   Collection Time: 05/07/17  5:55 PM  Result Value Ref Range Status   Specimen Description BLOOD PICC LINE LEFT  Final   Special Requests   Final    BOTTLES DRAWN AEROBIC AND ANAEROBIC Blood Culture adequate volume   Culture   Final    NO GROWTH 3 DAYS Performed at [facility], [place] [address], [place], [place] [address]    Report Status PENDING  Incomplete     Labs: Basic Metabolic Panel: Recent Labs  Lab 05/07/17 1756 05/08/17 0806 05/08/17 1029 05/09/17 0920 05/10/17 0407  NA 132* 135 136 134* 136  K 3.9 3.2* 2.9* 3.3* 3.6  CL 99* 102 102 100* 103  CO2 24 26 25 25 27   GLUCOSE 103* 112* 118* 125* 92  BUN 8 7 7 6 8   CREATININE 0.60 0.60 0.68 0.56 0.48  CALCIUM  8.9 8.3* 8.3* 8.1* 8.1*  MG  --   --  1.7 2.4  --    Liver Function Tests: Recent Labs  Lab 05/07/17 1756  AST 65*  ALT 21  ALKPHOS 118  BILITOT 2.3*  PROT 7.0  ALBUMIN 3.2*   No results for input(s): LIPASE, AMYLASE in the last 168 hours. No results for input(s): AMMONIA in the last 168 hours. CBC: Recent Labs  Lab 05/07/17 1756 05/08/17 0806 05/09/17 0920 05/10/17 0407  WBC 5.6 3.6* 3.7* 3.7*  NEUTROABS 4.4  --  2.5  --   HGB 7.6* 8.3* 9.9* 9.0*  HCT 23.6* 26.2* 30.3* 27.4*  MCV 100.9* 98.1 95.6 96.1  PLT 155 149* 153 155   Cardiac Enzymes: No results for input(s): CKTOTAL, CKMB, CKMBINDEX, TROPONINI in the last 168 hours. BNP: BNP (last 3 results) Recent Labs    05/07/17 2345 05/08/17 1029  BNP 801.0* 765.0*    ProBNP (last 3 results) No results for input(s): PROBNP in the last 8760 hours.  CBG: No results for input(s): GLUCAP in the last 168 hours.     Signed:  Irine Seal MD.  [facility] 05/10/2017, 3:33 PM

## 2017-05-10 NOTE — Progress Notes (Signed)
Pharmacy Antibiotic Note  [name] is a 61 y.o. female s/p reimplantation of right TKA on 12/3 on vancomycin PTA for enterococcus PJI (started 12/4 with anticipated end date 06/09/2017), presented to the ED on 05/07/17 with c/o SOB and fever. Vancomycin resumed and cefepime added for broad coverage on admission.  Today, 05/10/2017: - Tmax 101.7, wbc low - scr low (crcl~83) - all cultures have been negative thus far - vancomycin trough today came back as 14  Plan: - Anticipate vancomycin level will be 15-20 with accumulation-- hence, continue with 750 mg IV q12h for now -  Continue cefepime 1gm IV q8h  __________________________________________  Height: 5\' 7"  (170.2 cm) Weight: 183 lb 9.6 oz (83.3 kg) IBW/kg (Calculated) : 61.6  Temp (24hrs), Avg:99.2 F (37.3 C), Min:97.7 F (36.5 C), Max:101.7 F (38.7 C)  Recent Labs  Lab 05/07/17 1756 05/07/17 1805 05/07/17 2345 05/08/17 0806 05/08/17 1029 05/09/17 0920 05/10/17 0407  WBC 5.6  --   --  3.6*  --  3.7* 3.7*  CREATININE 0.60  --   --  0.60 0.68 0.56 0.48  LATICACIDVEN  --  1.73 1.2  --   --   --   --     Estimated Creatinine Clearance: 83 mL/min (by C-G formula based on SCr of 0.48 mg/dL).    Allergies  Allergen Reactions  . Penicillins Hives, Itching and Rash    Has patient had a PCN reaction causing immediate rash, facial/tongue/throat swelling, SOB or lightheadedness with hypotension: yes Has patient had a PCN reaction causing severe rash involving mucus membranes or skin necrosis: No Has patient had a PCN reaction that required hospitalization No Has patient had a PCN reaction occurring within the last 10 years: yes If all of the above answers are "NO", then may proceed with Cephalosporin use.   Denies airway involvement   . Other Rash    STERI STRIPS - Blisters  . Tape Hives    Can tolerate paper tape    Antimicrobials this admission: Vanc 12/4 >> Cefepime 12/13 >>  Dose adjustments this  admission: 12/16 at 0930 VT= 14 (on 750 ng IV q12h) -- cont for now, anticipate it will be 15-20 with accumulation  Microbiology results: 2836 BCx x2:  12/13 UCx: NGF  11/30 MRSA PCR: neg  Thank you for allowing pharmacy to be a part of this patient's care.  Lynelle Doctor 05/10/2017 9:19 AM

## 2017-05-10 NOTE — Discharge Instructions (Signed)

## 2017-05-10 NOTE — Progress Notes (Addendum)
Patient ID: [name], female   DOB: [birth year], 61 y.o.   MRN: [id number]  Date of evaluation 05/09/17, 2pm  Seen and evaluated yesterday for her right knee She was feeling a lot better after her transfusions  I appreciate ID reviewing her condition and their recs  Right knee - stable exam findings from office visit prior to admission Incision remains healed without drainage and no evidence of skin or skin graft healing issues Some warmth and swelling expected in the right knee region  OK for discharge today if medically stable  RTC next week as scheduled Continue Vanc per ID recs at home HHPT already in place  Decision to keep for further transfusion per medicine and patient  eval today given challenges of antibodies in setting of her chemotherapy

## 2017-05-10 NOTE — Progress Notes (Signed)
OT Cancellation Note  Patient Details Name: [name] MRN: [id number] DOB: [birth year]   Cancelled Treatment:    Reason Eval/Treat Not Completed: PT screened, no needs identified, will sign off  Shyasia Funches 05/10/2017, 7:54 AM  Lesle Chris, OTR/L [phone] 05/10/2017

## 2017-05-10 NOTE — Progress Notes (Signed)
[facility]  Date of Admission:  05/07/2017           Day 13 vancomycin        Day 4 cefepime ASSESSMENT: I'm not sure why she had a fever of 101.7 last night. She does not have any evidence of new, active infection by exam, cultures or chest x-ray. She is desperately wanting to go home. Her husband and daughter are comfortable with that decision and so MI. I asked him to call me this week if she continues to have fevers of above 101 or greater.  PLAN: 1. Continue IV vancomycin at home for 6 weeks postoperatively as previously planned 2. DC cefepime 3. It is okay with me to discharge her home this afternoon 4. I will arrange follow-up in my clinic  Principal Problem:   Symptomatic anemia Active Problems:   S/P total knee arthroplasty, right   Metastatic breast cancer (HCC)   SOB (shortness of breath)   Sepsis (HCC)   Pyogenic arthritis of right knee joint (HCC)   Enterococcal infection   Anemia due to chemotherapy   Abnormal LFTs   Fever   Scheduled Meds: . cholecalciferol  2,000 Units Oral Once per day on Sun Tue Thu Sat  . darifenacin  15 mg Oral Daily  . docusate sodium  100 mg Oral BID  . enoxaparin  40 mg Subcutaneous Q24H  . gabapentin  300 mg Oral TID  . loratadine  10 mg Oral Daily  . multivitamin with minerals  1 tablet Oral Daily  . polyethylene glycol  17 g Oral BID  . sodium chloride flush  10-40 mL Intracatheter Q12H   Continuous Infusions: . ceFEPime (MAXIPIME) IV Stopped (05/10/17 0805)  . vancomycin 750 mg (05/10/17 1034)   PRN Meds:.ibuprofen, levalbuterol, methocarbamol, ondansetron **OR** ondansetron (ZOFRAN) IV, oxyCODONE, sodium chloride flush, zolpidem   SUBJECTIVE: She was aware of her fever yesterday but otherwise feels completely normal. She denies any new cough, shortness of breath, diarrhea or dysuria. Her right knee is unchanged.  Review of Systems: Review of Systems  Constitutional: Positive for  fever. Negative for chills and diaphoresis.  Respiratory: Negative for cough.   Cardiovascular: Negative for chest pain.  Gastrointestinal: Negative for diarrhea.  Genitourinary: Negative for dysuria.  Musculoskeletal: Positive for joint pain.    Allergies  Allergen Reactions  . Penicillins Hives, Itching and Rash    Has patient had a PCN reaction causing immediate rash, facial/tongue/throat swelling, SOB or lightheadedness with hypotension: yes Has patient had a PCN reaction causing severe rash involving mucus membranes or skin necrosis: No Has patient had a PCN reaction that required hospitalization No Has patient had a PCN reaction occurring within the last 10 years: yes If all of the above answers are "NO", then may proceed with Cephalosporin use.   Denies airway involvement   . Other Rash    STERI STRIPS - Blisters  . Tape Hives    Can tolerate paper tape    OBJECTIVE: Vitals:   05/09/17 1500 05/09/17 2119 05/09/17 2237 05/10/17 0538  BP: 112/62 (!) 110/55  108/66  Pulse: 80 67  83  Resp: 18 18  18   Temp: 97.8 F (36.6 C) (!) 101.7 F (38.7 C) 99.6 F (37.6 C) 97.7 F (36.5 C)  TempSrc: Oral Oral Oral Oral  SpO2: 96% 92%  94%  Weight:    183 lb 9.6 oz (83.3 kg)  Height:  Body mass index is 28.76 kg/m.  Physical Exam  Constitutional: She is oriented to person, place, and time.  She is in good spirits. Her husband and daughter are present.  HENT:  Mouth/Throat: No oropharyngeal exudate.  Cardiovascular: Normal rate and regular rhythm.  No murmur heard. Pulmonary/Chest: Effort normal and breath sounds normal. She has no wheezes. She has no rales.  Abdominal: Soft. She exhibits no distension. There is no tenderness.  Musculoskeletal:  No change in postoperative right knee swelling.  Neurological: She is alert and oriented to person, place, and time.  Skin: No rash noted.  Psychiatric: Mood and affect normal.    Lab Results Lab Results  Component  Value Date   WBC 3.7 (L) 05/10/2017   HGB 9.0 (L) 05/10/2017   HCT 27.4 (L) 05/10/2017   MCV 96.1 05/10/2017   PLT 155 05/10/2017    Lab Results  Component Value Date   CREATININE 0.48 05/10/2017   BUN 8 05/10/2017   NA 136 05/10/2017   K 3.6 05/10/2017   CL 103 05/10/2017   CO2 27 05/10/2017    Lab Results  Component Value Date   ALT 21 05/07/2017   AST 65 (H) 05/07/2017   ALKPHOS 118 05/07/2017   BILITOT 2.3 (H) 05/07/2017     Microbiology: Recent Results (from the past 240 hour(s))  Urine Culture     Status: None   Collection Time: 05/07/17  5:21 PM  Result Value Ref Range Status   Specimen Description URINE, CLEAN CATCH  Final   Special Requests NONE  Final   Culture   Final    NO GROWTH Performed at [facility], [place] [address], [place], [place] [address]    Report Status 05/08/2017 FINAL  Final  Blood Culture (routine x 2)     Status: None (Preliminary result)   Collection Time: 05/07/17  5:50 PM  Result Value Ref Range Status   Specimen Description BLOOD RIGHT FOREARM  Final   Special Requests   Final    BOTTLES DRAWN AEROBIC AND ANAEROBIC Blood Culture adequate volume   Culture   Final    NO GROWTH 2 DAYS Performed at [facility], [place] [address], [place], [place] [address]    Report Status PENDING  Incomplete  Blood Culture (routine x 2)     Status: None (Preliminary result)   Collection Time: 05/07/17  5:55 PM  Result Value Ref Range Status   Specimen Description BLOOD PICC LINE LEFT  Final   Special Requests   Final    BOTTLES DRAWN AEROBIC AND ANAEROBIC Blood Culture adequate volume   Culture   Final    NO GROWTH 2 DAYS Performed at [facility], [place] [address] [place], [place] [address]    Report Status PENDING  Incomplete    Michel Bickers, MD [facility] [phone] pager   [phone] cell 05/10/2017, 11:25 AM

## 2017-05-11 DIAGNOSIS — T8453XA Infection and inflammatory reaction due to internal right knee prosthesis, initial encounter: Secondary | ICD-10-CM | POA: Diagnosis not present

## 2017-05-12 DIAGNOSIS — T8453XA Infection and inflammatory reaction due to internal right knee prosthesis, initial encounter: Secondary | ICD-10-CM | POA: Diagnosis not present

## 2017-05-12 LAB — CULTURE, BLOOD (ROUTINE X 2)
Culture: NO GROWTH
Culture: NO GROWTH
SPECIAL REQUESTS: ADEQUATE
Special Requests: ADEQUATE

## 2017-05-13 DIAGNOSIS — Z792 Long term (current) use of antibiotics: Secondary | ICD-10-CM | POA: Diagnosis not present

## 2017-05-13 DIAGNOSIS — Z791 Long term (current) use of non-steroidal anti-inflammatories (NSAID): Secondary | ICD-10-CM | POA: Diagnosis not present

## 2017-05-13 DIAGNOSIS — T8453XA Infection and inflammatory reaction due to internal right knee prosthesis, initial encounter: Secondary | ICD-10-CM | POA: Diagnosis not present

## 2017-05-13 DIAGNOSIS — Z79891 Long term (current) use of opiate analgesic: Secondary | ICD-10-CM | POA: Diagnosis not present

## 2017-05-13 DIAGNOSIS — Z5181 Encounter for therapeutic drug level monitoring: Secondary | ICD-10-CM | POA: Diagnosis not present

## 2017-05-13 DIAGNOSIS — Z452 Encounter for adjustment and management of vascular access device: Secondary | ICD-10-CM | POA: Diagnosis not present

## 2017-05-13 DIAGNOSIS — Z7901 Long term (current) use of anticoagulants: Secondary | ICD-10-CM | POA: Diagnosis not present

## 2017-05-13 DIAGNOSIS — Z96651 Presence of right artificial knee joint: Secondary | ICD-10-CM | POA: Diagnosis not present

## 2017-05-14 DIAGNOSIS — Z7689 Persons encountering health services in other specified circumstances: Secondary | ICD-10-CM | POA: Diagnosis not present

## 2017-05-14 DIAGNOSIS — T8453XA Infection and inflammatory reaction due to internal right knee prosthesis, initial encounter: Secondary | ICD-10-CM | POA: Diagnosis not present

## 2017-05-16 DIAGNOSIS — T8459XA Infection and inflammatory reaction due to other internal joint prosthesis, initial encounter: Secondary | ICD-10-CM | POA: Diagnosis not present

## 2017-05-16 DIAGNOSIS — B952 Enterococcus as the cause of diseases classified elsewhere: Secondary | ICD-10-CM | POA: Diagnosis not present

## 2017-05-18 DIAGNOSIS — T8453XA Infection and inflammatory reaction due to internal right knee prosthesis, initial encounter: Secondary | ICD-10-CM | POA: Diagnosis not present

## 2017-05-21 DIAGNOSIS — T8453XA Infection and inflammatory reaction due to internal right knee prosthesis, initial encounter: Secondary | ICD-10-CM | POA: Diagnosis not present

## 2017-05-22 ENCOUNTER — Other Ambulatory Visit: Payer: Self-pay | Admitting: Pharmacist

## 2017-05-23 DIAGNOSIS — T8459XA Infection and inflammatory reaction due to other internal joint prosthesis, initial encounter: Secondary | ICD-10-CM | POA: Diagnosis not present

## 2017-05-23 DIAGNOSIS — B952 Enterococcus as the cause of diseases classified elsewhere: Secondary | ICD-10-CM | POA: Diagnosis not present

## 2017-05-25 DIAGNOSIS — Z452 Encounter for adjustment and management of vascular access device: Secondary | ICD-10-CM | POA: Diagnosis not present

## 2017-05-25 DIAGNOSIS — T8453XA Infection and inflammatory reaction due to internal right knee prosthesis, initial encounter: Secondary | ICD-10-CM | POA: Diagnosis not present

## 2017-05-25 DIAGNOSIS — Z459 Encounter for adjustment and management of unspecified implanted device: Secondary | ICD-10-CM | POA: Diagnosis not present

## 2017-05-25 DIAGNOSIS — T8459XA Infection and inflammatory reaction due to other internal joint prosthesis, initial encounter: Secondary | ICD-10-CM | POA: Diagnosis not present

## 2017-05-25 MED FILL — GABAPENTIN 300 MG CAPSULE: 300 | 30 days supply | Qty: 90 | Fill #3

## 2017-05-27 DIAGNOSIS — Z171 Estrogen receptor negative status [ER-]: Secondary | ICD-10-CM | POA: Diagnosis not present

## 2017-05-27 DIAGNOSIS — C50412 Malignant neoplasm of upper-outer quadrant of left female breast: Secondary | ICD-10-CM | POA: Diagnosis not present

## 2017-05-27 DIAGNOSIS — C792 Secondary malignant neoplasm of skin: Secondary | ICD-10-CM | POA: Diagnosis not present

## 2017-05-27 DIAGNOSIS — Z5112 Encounter for antineoplastic immunotherapy: Secondary | ICD-10-CM | POA: Diagnosis not present

## 2017-05-28 ENCOUNTER — Ambulatory Visit: Payer: 59 | Attending: Orthopedic Surgery | Admitting: Physical Therapy

## 2017-05-28 ENCOUNTER — Encounter: Payer: Self-pay | Admitting: Physical Therapy

## 2017-05-28 DIAGNOSIS — M6281 Muscle weakness (generalized): Secondary | ICD-10-CM | POA: Diagnosis not present

## 2017-05-28 DIAGNOSIS — T8453XA Infection and inflammatory reaction due to internal right knee prosthesis, initial encounter: Secondary | ICD-10-CM | POA: Diagnosis not present

## 2017-05-28 DIAGNOSIS — R600 Localized edema: Secondary | ICD-10-CM | POA: Insufficient documentation

## 2017-05-28 DIAGNOSIS — M25661 Stiffness of right knee, not elsewhere classified: Secondary | ICD-10-CM | POA: Insufficient documentation

## 2017-05-28 DIAGNOSIS — R2689 Other abnormalities of gait and mobility: Secondary | ICD-10-CM | POA: Insufficient documentation

## 2017-05-28 DIAGNOSIS — M25561 Pain in right knee: Secondary | ICD-10-CM | POA: Insufficient documentation

## 2017-05-28 NOTE — Therapy (Signed)
[facility] [address], [place], [place], [address] Phone: [phone]   Fax:  [phone]  Physical Therapy Evaluation  Patient Details  Name: [name] MRN: [id number] Date of Birth: 1955-09-14 Referring Provider: Paralee Cancel, MD    Encounter Date: 05/28/2017  PT End of Session - 05/28/17 1142    Visit Number  1    Date for PT Re-Evaluation  07/09/17    Authorization Type  [employer]    Authorization Time Period  05/28/17 to 07/23/17    PT Start Time  1015    PT Stop Time  1054    PT Time Calculation (min)  39 min    Activity Tolerance  Patient tolerated treatment well;No increased pain    Behavior During Therapy  WFL for tasks assessed/performed       Past Medical History:  Diagnosis Date  . 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 Allergic reaction 07/24/2016  . Anemia    needed transfusion spring of 2018  . Arthritis KNEES   right knee, hx. past left knee replacemnt.  . Blood clot in vein    around Portacath right chest-tx. Eliquis about a yr., prior Lovenox.  . Blood dyscrasia   . Chronic anticoagulation HX  PORT-A-CATH CLOT   2010 - HAS BEEN ON LOVENOX SINCE THE CLOT  . Drug rash 07/24/2016  . Elevated blood pressure reading without diagnosis of hypertension    PT MONITORS HER B/P AT HOME  . Heart murmur   . 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 PONV (postoperative nausea and vomiting)    ponv likes scopolamine patch  . Port-A-Cath in place    RIGHT CHEST   . Skin cancer of anterior chest BREAST CANCER PRIMARY W/ METS TO CHEST WALL SKIN CANCER--  CHEMOTHERAPY EVERY 3 WEEKS AT [facility]   left 9 yrs ago, chemo every 3 weeks at [facility], last chemo july 3rd  . Skin changes related to chemotherapy    per patient she has scabbed over skin circumventing port to r  ight upper chest ; denie drainage and state it is due to chemptherapy   . Status post skin flap graft    right chest wall with metastatis right anterior chest -no drainage or open wound.    Past Surgical History:  Procedure Laterality Date  . APPLICATION OF A-CELL OF BACK Right 07/31/2016   Procedure: CELLERATE COLLAGEN PLACEMENT;  Surgeon: Loel Lofty Dillingham, DO;  Location: [facility];  Service: Plastics;  Laterality: Right;  . CHEST WALL TUMOR EXCISION     right  . EXCISIONAL TOTAL KNEE ARTHROPLASTY WITH ANTIBIOTIC SPACERS Right 12/02/2016   Procedure: EXCISIONAL TOTAL KNEE ARTHROPLASTY WITH ANTIBIOTIC SPACERS;  Surgeon: Paralee Cancel, MD;  Location: [facility];  Service: Orthopedics;  Laterality: Right;  90 mins  . hemotoma     evacuation left chest wall  . I&D KNEE WITH POLY EXCHANGE Right 05/28/2016   Procedure: IRRIGATION AND DEBRIDEMENT KNEE WOUND VAC PLACMENT;  Surgeon: Susa Day, MD;  Location: [facility];  Service: Orthopedics;  Laterality: Right;  . I&D KNEE WITH POLY EXCHANGE Right 05/30/2016   Procedure: RADICAL SYNOVECTOMY,IRRIGATION AND DEBRIDEMENT KNEE WITH POLY EXCHANGE WITH ANTIBIOTIC BEADS, APPLICATION OF WOUND VAC;  Surgeon: Susa Day, MD;  Location: [facility];  Service: Orthopedics;  Laterality: Right;  .  INCISION AND DRAINAGE OF WOUND Right 07/31/2016   Procedure: IRRIGATION AND DEBRIDEMENT RIGHT KNEE  WOUND;  Surgeon: Loel Lofty Dillingham, DO;  Location: [facility];  Service: Plastics;  Laterality: Right;  . IR FLUORO GUIDE CV LINE LEFT  04/30/2017  . IR US GUIDE VASC ACCESS LEFT  04/30/2017  . IRRIGATION AND DEBRIDEMENT KNEE Right 04/12/2016   Procedure: IRRIGATION AND DEBRIDEMENT KNEE;  Surgeon: Nicholes Stairs, MD;  [facility]: [facility];  Service: Orthopedics;  Laterality: Right;  . IRRIGATION AND DEBRIDEMENT KNEE Right 12/16/2016   Procedure: Repeat irrigation and debridement right knee, wound closure  wound vac REPLACEMENT ANTIBIOTIC SPACERS;  Surgeon: Paralee Cancel, MD;  [facility];  Service: Orthopedics;  Laterality: Right;  . KNEE ARTHROSCOPY  02/13/2012   Procedure: ARTHROSCOPY KNEE;  Surgeon: Johnn Hai, MD;  Location: [facility];  Service: Orthopedics;  Laterality: Left;  WITH DEBRIDEMENt   . KNEE ARTHROSCOPY WITH LATERAL MENISECTOMY  02/13/2012   Procedure: KNEE ARTHROSCOPY WITH LATERAL MENISECTOMY;  Surgeon: Johnn Hai, MD;  Location: [facility];  Service: Orthopedics;;  partial  . LEFT MODIFIED RADICAL MASTECTOMY/ RIGHT TOTAL MASTECTOMY  11-13-2007   LEFT BREAST CANCER W/ AXILLARY LYMPH NODE METASTASIS AND POST NEOADJUVANT CHEMO  . MASTECTOMY     Bilateral  . PLACEMENT PORT-A-CATH  06/22/2007   right chest new port placed 2015, right chest  . REIMPLANTATION OF TOTAL KNEE Right 04/27/2017   Procedure: Reimplantation of right total knee arthroplasty;  Surgeon: Paralee Cancel, MD;  Location[facility];  Service: Orthopedics;  Laterality: Right;  Adductor Block  . SKIN GRAFT     chest-post tumor removal, second occurrence of cancer" 2015 surgery for Flap 2015"  . SKIN SPLIT GRAFT Right 01/29/2017   Procedure: SKIN GRAFT SPLIT THICKNESS TO RIGHT KNEE WOUND;  Surgeon: Wallace Going, DO;  Location: [facility];  Service: Plastics;  Laterality: Right;  . TOTAL KNEE ARTHROPLASTY Left 09/23/2012   Procedure: LEFT TOTAL KNEE ARTHROPLASTY;  Surgeon: Johnn Hai, MD;  [facility];  Service: Orthopedics;  Laterality: Left;  . TOTAL KNEE ARTHROPLASTY Right 03/20/2016   Procedure: RIGHT TOTAL KNEE ARTHROPLASTY;  Surgeon: Susa Day, MD;  [facility];  Service: Orthopedics;  Laterality: Right;  . TRANSTHORACIC ECHOCARDIOGRAM  11-18-2011  DR BENSIMHON (ECHO EVERY 3 MONTHS)   HX CHEMO INDUCED CARDIOTOXICITY/  LVSF NORMAL/ EF 55-50%/ MILD MITRIAL VALVE REGURG./ MILDLY INCREASED SYSTOLIC PRESSURE OF PULMONARY ARTERIES  . VASCULAR SURGERY Right    portacath chest    There were no vitals filed for this visit.   Subjective  Assessment - 05/28/17 1022    Subjective  Pt reports having Rt TKA back in 2017, but shortly after she started having fever and signs of infection. She underwent several I&D over the course of several months which didn't seem to help. She finally had full reconstruction on 04/27/17. Since then, things have been going well. She does have a couple of hematomas that are causing more of her pain than anything. She had HHPT for a couple of weeks and has been working on her exercises independently since then.     Pertinent History  Breast cancer; chemo every 3 weeks     Limitations  Walking;House hold activities    How long can you walk comfortably?  up on her feet for short periods of time only     Patient Stated Goals  improve mobility     Currently in Pain?  Yes    Pain Score  2     Pain Location  Knee    Pain Orientation  Right    Pain Descriptors / Indicators  Pressure    Pain Type  Surgical pain    Pain Radiating Towards  none     Pain Onset  More than a month ago    Pain Frequency  Constant    Aggravating Factors   alot of activity     Pain Relieving Factors  ice/elevation     Effect of Pain on Daily Activities  moderate     Multiple Pain Sites  No         [facility] PT Assessment - 05/28/17 0001      Assessment   Medical Diagnosis  Rt TKA    Referring Provider  Paralee Cancel, MD     Onset Date/Surgical Date  04/27/17    Next MD Visit  unsure     Prior Therapy  HHPT      Precautions   Precautions  None      Balance Screen   Has the patient fallen in the past 6 months  No    Has the patient had a decrease in activity level because of a fear of falling?   No    Is the patient reluctant to leave their home because of a fear of falling?   No      Prior Function   Level of Independence  Independent with basic ADLs      Cognition   Overall Cognitive Status  Within Functional Limits for tasks assessed      Observation/Other Assessments   Observations  pt ambulating without AD,  significant swelling Rt knee/calf/ankle      Sensation   Light Touch  Appears Intact      ROM / Strength   AROM / PROM / Strength  AROM;Strength      AROM   AROM Assessment Site  Knee    Right/Left Knee  Right;Left    Right Knee Extension  5    Right Knee Flexion  85      Strength   Overall Strength Comments  Unable to complete SLR on the Rt without extension lag     Strength Assessment Site  Hip;Knee;Ankle    Right/Left Hip  Right;Left    Right/Left Knee  Right;Left    Right Knee Flexion  4/5    Right Knee Extension  4/5    Left Knee Extension  5/5    Right/Left Ankle  Right;Left    Right Ankle Dorsiflexion  4/5    Left Ankle Dorsiflexion  4/5      Palpation   Palpation comment  scar adhesions noted along surgical incision, redness anterior knee      Transfers   Five time sit to stand comments   18 sec, UE support and weight shift Lt       Ambulation/Gait   Gait Pattern  Decreased stance time - right;Decreased step length - left;Decreased arm swing - left;Decreased arm swing - right;Decreased stride length;Decreased weight shift to right    Pre-Gait Activities  Pt ascends and descends clinic steps with 2 handrails and step to pattern              Objective measurements completed on examination: See above findings.              PT Education - 05/28/17 1202    Education provided  Yes  Education Details  eval findings/POC; importance of establishing better movement patterns with sit to stand and walking; knee flexion ROM; encouraged pt to brice ace wrap into next session for demo of safe wrapping techniques    Person(s) Educated  Patient    Methods  Explanation    Comprehension  Verbalized understanding       PT Short Term Goals - 05/28/17 1148      PT SHORT TERM GOAL #1   Title  Pt will be independent with her initial HEP to improve strength and mobility.    Time  3    Period  Weeks    Status  New    Target Date  06/18/17      PT SHORT TERM  GOAL #2   Title  Pt will be able to complete atleast 20 repetitions of SLR without extension lag, which will improve her safety with ambulation and other activity.     Time  3    Period  Weeks    Status  New      PT SHORT TERM GOAL #3   Title  Pt will demo Lt knee AROM flexion to atlast 100 deg which will help with technique during sit to stand.     Time  3    Period  Weeks    Status  New      PT SHORT TERM GOAL #4   Title  Pt will report atleast 25% reduction in pain for improved activity completion and standing tolerance at home.     Time  3    Period  Weeks    Status  New      PT SHORT TERM GOAL #5   Title  Pt will demo proper mechanics with sit to stand, without significant weight shift Lt and no more than 1 UE support for safety.     Time  3    Period  Weeks    Status  New        PT Long Term Goals - 05/28/17 1151      PT LONG TERM GOAL #1   Title  Pt will be independent with her advanced HEP for further improvements in strength and mobility for return to regular activity around the home.     Time  8    Period  Weeks    Status  New    Target Date  07/23/17      PT LONG TERM GOAL #2   Title  Pt will demo improved Rt knee flexion AROM to atleast 110 deg which will allow her to get in/out of a chair more efficiently.     Time  8    Period  Weeks    Status  New      PT LONG TERM GOAL #3   Title  Pt will ambulate without antalgic pattern, noting symmetrical step lengths, proper push off and arm swing, to improve her efficiency with daily activity.     Time  8    Period  Weeks    Status  New      PT LONG TERM GOAL #4   Title  Pt will report improved endurance and activity tolerance evident by her ability to prepare a meal at home without the need for a rest break.     Time  8    Period  Weeks    Status  New      PT LONG TERM GOAL #5   Title  Pt  will report atleast 60% improvement in her pain/stiffness/mobility from the start of therapy.     Time  8    Period   Weeks    Status  New      Additional Long Term Goals   Additional Long Term Goals  Yes      PT LONG TERM GOAL #6   Title  Pt will ascend and descend 6" steps with reciprocal pattern and no more than 1 handrail assistance x3 trials, to allow her to safely get to her bedroom upstairs.     Time  8    Period  Weeks    Status  New             Plan - 05/28/17 1143    Clinical Impression Statement  Pt is a pleasant 62 y.o F, with history of breast cancer and currently undergoing chemotherapy, who presents to OPPT today with several months of complications post TKA and final revision on 05/07/17. She has increased swelling, pain and stiffness post-op which is greatly limiting her mobility and independence. In addition, she demonstrates Rt knee flexion to 85 deg, RLE weakness, poor proprioception and impaired mechanics with ambulation and sit to stand. She would benefit from skilled PT to address her limitations in ROM, strength, mobility and flexibility to facilitate independence with daily activity and assist with her return to work in the future.     History and Personal Factors relevant to plan of care:  breast cancer, chemotherapy    Clinical Presentation  Evolving    Clinical Presentation due to:  infections throughout the past year, somewhat improved at this point     Clinical Decision Making  Low    Rehab Potential  Good    PT Frequency  Other (comment) 2-3x/week    PT Duration  8 weeks    PT Treatment/Interventions  ADLs/Self Care Home Management;Cryotherapy;Electrical Stimulation;Moist Heat;Balance training;Therapeutic exercise;Therapeutic activities;Functional mobility training;Stair training;Gait training;Neuromuscular re-education;Patient/family education;Scar mobilization;Manual techniques;Passive range of motion;Dry needling;Taping    PT Next Visit Plan  Provide HEP; focus on improving knee ROM, decrease swelling, gait training    PT Home Exercise Plan  currently completing HHPT  HEP    Consulted and Agree with Plan of Care  Patient       Patient will benefit from skilled therapeutic intervention in order to improve the following deficits and impairments:  Abnormal gait, Decreased activity tolerance, Decreased balance, Difficulty walking, Increased edema, Impaired flexibility, Hypomobility, Decreased strength, Decreased range of motion, Decreased endurance, Decreased mobility, Increased muscle spasms, Impaired tone, Pain, Improper body mechanics, Increased fascial restricitons, Decreased scar mobility  Visit Diagnosis: Acute pain of right knee  Stiffness of right knee, not elsewhere classified  Muscle weakness (generalized)     Problem List Patient Active Problem List   Diagnosis Date Noted  . Fever   . Anemia due to chemotherapy 05/07/2017  . Abnormal LFTs 05/07/2017  . History of breast cancer   . Bilateral leg edema   . Enterococcal infection   . S/P revision of total knee, right 04/27/2017  . Pyogenic arthritis of right knee joint ([place])   . Acute blood loss anemia 12/12/2016  . Right knee abx spacer 12/02/2016  . Physical exam 07/31/2016  . Allergic reaction 07/24/2016  . Drug rash 07/24/2016  . S/P total knee arthroplasty, right 05/31/2016  . 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 Nonhealing surgical wound 05/28/2016  . Sepsis ([place]) 05/10/2016  . UTI (urinary tract infection) 05/10/2016  . Cellulitis  of right knee 05/10/2016  . Blood clot in vein   . Wound dehiscence, surgical 04/12/2016  . 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 Vitamin D deficiency 01/25/2016  . Metastatic cancer to chest wall ([place]) 02/02/2015  . Primary osteoarthritis of right knee 02/01/2015  . Symptomatic anemia 02/01/2015  . Frequent UTI 01/12/2014  . Sinus tachycardia 11/03/2013  . SOB (shortness of breath) 11/03/2013  . Postoperative anemia due to acute blood loss 12/24/2012  .    09/23/2012  . Sinusitis 07/28/2012  . Neuropathy due to chemotherapeutic drug  ([place]) 07/25/2011  . 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 Metastatic breast cancer ([place]) 11/12/2010  . Chronic anticoagulation 11/12/2010  . Anxiety, generalized 03/26/2008    12:04 PM,05/28/17 Elly Modena PT, DPT [facility] [address] [place], [place], [address] Phone: [phone]   Fax:  [phone]  Name: [name] MRN: [id number] Date of Birth: 07/15/1955

## 2017-05-28 NOTE — Patient Instructions (Signed)
  KNEE FLEXION STRETCH - SELF ASSISTED  While seated in a chair, use your unaffected leg to bend your affected knee until a stretch is felt.  Hold 20-30 sec, and increase stretch periodically while sitting/watching TV.    [facility] [address] [place], [place] [address] Phone # [phone] Fax [phone]

## 2017-05-29 ENCOUNTER — Other Ambulatory Visit: Payer: Self-pay | Admitting: Pharmacist

## 2017-05-30 DIAGNOSIS — B952 Enterococcus as the cause of diseases classified elsewhere: Secondary | ICD-10-CM | POA: Diagnosis not present

## 2017-05-30 DIAGNOSIS — T8459XA Infection and inflammatory reaction due to other internal joint prosthesis, initial encounter: Secondary | ICD-10-CM | POA: Diagnosis not present

## 2017-06-01 ENCOUNTER — Ambulatory Visit: Payer: 59 | Admitting: Physical Therapy

## 2017-06-01 ENCOUNTER — Encounter: Payer: Self-pay | Admitting: Physical Therapy

## 2017-06-01 DIAGNOSIS — M25561 Pain in right knee: Secondary | ICD-10-CM | POA: Diagnosis not present

## 2017-06-01 DIAGNOSIS — R2689 Other abnormalities of gait and mobility: Secondary | ICD-10-CM | POA: Diagnosis not present

## 2017-06-01 DIAGNOSIS — M1711 Unilateral primary osteoarthritis, right knee: Secondary | ICD-10-CM | POA: Diagnosis not present

## 2017-06-01 DIAGNOSIS — M25661 Stiffness of right knee, not elsewhere classified: Secondary | ICD-10-CM

## 2017-06-01 DIAGNOSIS — Z471 Aftercare following joint replacement surgery: Secondary | ICD-10-CM | POA: Diagnosis not present

## 2017-06-01 DIAGNOSIS — Z96659 Presence of unspecified artificial knee joint: Secondary | ICD-10-CM | POA: Diagnosis not present

## 2017-06-01 DIAGNOSIS — M6281 Muscle weakness (generalized): Secondary | ICD-10-CM

## 2017-06-01 DIAGNOSIS — Z96651 Presence of right artificial knee joint: Secondary | ICD-10-CM | POA: Diagnosis not present

## 2017-06-01 DIAGNOSIS — R600 Localized edema: Secondary | ICD-10-CM | POA: Diagnosis not present

## 2017-06-01 MED FILL — DOXYCYCLINE MONO 100 MG CAP: 100 | 30 days supply | Qty: 60 | Fill #0

## 2017-06-01 NOTE — Therapy (Signed)
[facility] [address] [place], [place], [address] Phone: [phone]   Fax:  [phone]  Physical Therapy Treatment  Patient Details  Name: [name] MRN: [id number] Date of Birth: September 19, 1955 Referring Provider: Paralee Cancel, MD    Encounter Date: 06/01/2017  PT End of Session - 06/01/17 1455    Visit Number  2    Date for PT Re-Evaluation  07/09/17    Authorization Type  [facility]    Authorization Time Period  05/28/17 to 07/23/17    PT Start Time  1450    PT Stop Time  1530    PT Time Calculation (min)  40 min    Activity Tolerance  Patient tolerated treatment well;No increased pain    Behavior During Therapy  WFL for tasks assessed/performed       Past Medical History:  Diagnosis Date  . 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 Allergic reaction 07/24/2016  . Anemia    needed transfusion spring of 2018  . Arthritis KNEES   right knee, hx. past left knee replacemnt.  . Blood clot in vein    around Portacath right chest-tx. Eliquis about a yr., prior Lovenox.  . Blood dyscrasia   . Chronic anticoagulation HX  PORT-A-CATH CLOT   2010 - HAS BEEN ON LOVENOX SINCE THE CLOT  . Drug rash 07/24/2016  . Elevated blood pressure reading without diagnosis of hypertension    PT MONITORS HER B/P AT HOME  . Heart murmur   . 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 PONV (postoperative nausea and vomiting)    ponv likes scopolamine patch  . Port-A-Cath in place    RIGHT CHEST   . Skin cancer of anterior chest BREAST CANCER PRIMARY W/ METS TO CHEST WALL SKIN CANCER--  CHEMOTHERAPY EVERY 3 WEEKS AT [facility]   left 9 yrs ago, chemo every 3 weeks at [place], last chemo july 3rd  . Skin changes related to chemotherapy    per patient she has scabbed over skin circumventing port to r  ight upper chest ; denie drainage and state it is due to chemptherapy   . Status post skin flap graft    right chest wall with metastatis right anterior chest -no drainage or open wound.    Past Surgical History:  Procedure Laterality Date  . APPLICATION OF A-CELL OF BACK Right 07/31/2016   Procedure: CELLERATE COLLAGEN PLACEMENT;  Surgeon: Loel Lofty Dillingham, DO;  Location: [facility];  Service: Plastics;  Laterality: Right;  . CHEST WALL TUMOR EXCISION     right  . EXCISIONAL TOTAL KNEE ARTHROPLASTY WITH ANTIBIOTIC SPACERS Right 12/02/2016   Procedure: EXCISIONAL TOTAL KNEE ARTHROPLASTY WITH ANTIBIOTIC SPACERS;  Surgeon: Paralee Cancel, MD;  Location: [facility];  Service: Orthopedics;  Laterality: Right;  90 mins  . hemotoma     evacuation left chest wall  . I&D KNEE WITH POLY EXCHANGE Right 05/28/2016   Procedure: IRRIGATION AND DEBRIDEMENT KNEE WOUND VAC PLACMENT;  Surgeon: Susa Day, MD;  Location: [facility];  Service: Orthopedics;  Laterality: Right;  . I&D KNEE WITH POLY EXCHANGE Right 05/30/2016   Procedure: RADICAL SYNOVECTOMY,IRRIGATION AND DEBRIDEMENT KNEE WITH POLY EXCHANGE WITH ANTIBIOTIC BEADS, APPLICATION OF WOUND VAC;  Surgeon: Susa Day, MD;  Location: [facility];  Service: Orthopedics;  Laterality: Right;  .  INCISION AND DRAINAGE OF WOUND Right 07/31/2016   Procedure: IRRIGATION AND DEBRIDEMENT RIGHT KNEE  WOUND;  Surgeon: Loel Lofty Dillingham, DO;  Location: [facility];  Service: Plastics;  Laterality: Right;  . IR FLUORO GUIDE CV LINE LEFT  04/30/2017  . IR US GUIDE VASC ACCESS LEFT  04/30/2017  . IRRIGATION AND DEBRIDEMENT KNEE Right 04/12/2016   Procedure: IRRIGATION AND DEBRIDEMENT KNEE;  Surgeon: Nicholes Stairs, MD;  Location: [facility];  Service: Orthopedics;  Laterality: Right;  . IRRIGATION AND DEBRIDEMENT KNEE Right 12/16/2016   Procedure: Repeat irrigation and debridement right knee, wound closure  wound vac REPLACEMENT ANTIBIOTIC SPACERS;  Surgeon: Paralee Cancel, MD;  [facility];  Service: Orthopedics;  Laterality: Right;  . KNEE ARTHROSCOPY  02/13/2012   Procedure: ARTHROSCOPY KNEE;  Surgeon: Johnn Hai, MD;  Location: [facility];  Service: Orthopedics;  Laterality: Left;  WITH DEBRIDEMENt   . KNEE ARTHROSCOPY WITH LATERAL MENISECTOMY  02/13/2012   Procedure: KNEE ARTHROSCOPY WITH LATERAL MENISECTOMY;  Surgeon: Johnn Hai, MD;  Location: [facility];  Service: Orthopedics;;  partial  . LEFT MODIFIED RADICAL MASTECTOMY/ RIGHT TOTAL MASTECTOMY  11-13-2007   LEFT BREAST CANCER W/ AXILLARY LYMPH NODE METASTASIS AND POST NEOADJUVANT CHEMO  . MASTECTOMY     Bilateral  . PLACEMENT PORT-A-CATH  06/22/2007   right chest new port placed 2015, right chest  . REIMPLANTATION OF TOTAL KNEE Right 04/27/2017   Procedure: Reimplantation of right total knee arthroplasty;  Surgeon: Paralee Cancel, MD;  Location: [facility];  Service: Orthopedics;  Laterality: Right;  Adductor Block  . SKIN GRAFT     chest-post tumor removal, second occurrence of cancer" 2015 surgery for Flap 2015"  . SKIN SPLIT GRAFT Right 01/29/2017   Procedure: SKIN GRAFT SPLIT THICKNESS TO RIGHT KNEE WOUND;  Surgeon: Wallace Going, DO;  Location: [facility];  Service: Plastics;  Laterality: Right;  . TOTAL KNEE ARTHROPLASTY Left 09/23/2012   Procedure: LEFT TOTAL KNEE ARTHROPLASTY;  Surgeon: Johnn Hai, MD;  [facility];  Service: Orthopedics;  Laterality: Left;  . TOTAL KNEE ARTHROPLASTY Right 03/20/2016   Procedure: RIGHT TOTAL KNEE ARTHROPLASTY;  Surgeon: Susa Day, MD;  Location[facility];  Service: Orthopedics;  Laterality: Right;  . TRANSTHORACIC ECHOCARDIOGRAM  11-18-2011  DR BENSIMHON (ECHO EVERY 3 MONTHS)   HX CHEMO INDUCED CARDIOTOXICITY/  LVSF NORMAL/ EF 55-50%/ MILD MITRIAL VALVE REGURG./ MILDLY INCREASED SYSTOLIC PRESSURE OF PULMONARY ARTERIES  . VASCULAR SURGERY Right    portacath chest    There were no vitals filed for this visit.  Subjective  Assessment - 06/01/17 1457    Subjective  Saw PA this AM. Checked hematoma. She will see Dr Alvan Dame next week for him to look at it. Pt wrapped her knee as she repors there is a spot that continues to bleed. MD knows about this as well.     Pertinent History  Breast cancer; chemo every 3 weeks     Limitations  Walking;House hold activities    How long can you walk comfortably?  up on her feet for short periods of time only     Patient Stated Goals  improve mobility     Currently in Pain?  Yes    Pain Score  4     Pain Location  Knee    Pain Orientation  Right    Pain Descriptors / Indicators  Aching    Aggravating Factors   Overdoing  Pain Relieving Factors  ice    Multiple Pain Sites  No         [facility] PT Assessment - 06/01/17 0001      AROM   Right Knee Flexion  90                  [facility] Adult PT Treatment/Exercise - 06/01/17 0001      Knee/Hip Exercises: Aerobic   Nustep  L1 x 8 min PTA present for status update      Knee/Hip Exercises: Standing   Hip Abduction  AROM;Stengthening;Both;1 set;10 reps;Knee straight    Rebounder  weight shifting 1 min 3 ways      Knee/Hip Exercises: Seated   Sit to Sand  3 sets;5 reps;with UE support Some UE, VC to use RTLE >      Knee/Hip Exercises: Supine   Quad Sets  Strengthening;Right;2 sets;10 reps    Short Arc Quad Sets  Strengthening;Both;2 sets;5 reps    Straight Leg Raises  Strengthening;Right;3 sets;5 sets    Other Supine Knee/Hip Exercises  Green band clamshells 2x10                PT Short Term Goals - 06/01/17 1521      PT SHORT TERM GOAL #1   Title  Pt will be independent with her initial HEP to improve strength and mobility.    Baseline  given 09/25/16    Time  3    Period  Weeks    Status  Achieved      PT SHORT TERM GOAL #2   Title  Pt will be able to complete atleast 20 repetitions of SLR without extension lag, which will improve her safety with ambulation and other activity.     Time  3     Period  Weeks    Status  Achieved      PT SHORT TERM GOAL #4   Title  Pt will report atleast 25% reduction in pain for improved activity completion and standing tolerance at home.     Time  3    Period  Weeks    Status  On-going        PT Long Term Goals - 05/28/17 1151      PT LONG TERM GOAL #1   Title  Pt will be independent with her advanced HEP for further improvements in strength and mobility for return to regular activity around the home.     Time  8    Period  Weeks    Status  New    Target Date  07/23/17      PT LONG TERM GOAL #2   Title  Pt will demo improved Rt knee flexion AROM to atleast 110 deg which will allow her to get in/out of a chair more efficiently.     Time  8    Period  Weeks    Status  New      PT LONG TERM GOAL #3   Title  Pt will ambulate without antalgic pattern, noting symmetrical step lengths, proper push off and arm swing, to improve her efficiency with daily activity.     Time  8    Period  Weeks    Status  New      PT LONG TERM GOAL #4   Title  Pt will report improved endurance and activity tolerance evident by her ability to prepare a meal at home without the need for a rest break.  Time  8    Period  Weeks    Status  New      PT LONG TERM GOAL #5   Title  Pt will report atleast 60% improvement in her pain/stiffness/mobility from the start of therapy.     Time  8    Period  Weeks    Status  New      Additional Long Term Goals   Additional Long Term Goals  Yes      PT LONG TERM GOAL #6   Title  Pt will ascend and descend 6" steps with reciprocal pattern and no more than 1 handrail assistance x3 trials, to allow her to safely get to her bedroom upstairs.     Time  8    Period  Weeks    Status  New            Plan - 06/01/17 1456    Clinical Impression Statement  Pt presents today with mild pain, significant edema proximal knee to ankle/foot and wrapped in a thicker ace wrap for some bleeding that she reports she has  difficulty stopping. MD is aware of all she reports. She follows up with MD next week so he can look at her hematoma. Pt was able to bend knee to 90 degrees with ease today and performed knee & hip strengthening exercises without any increase of pain, good control and correct muscle usage. She was able to demonstrate equal weight distribution with sit to stand with some practice.     Rehab Potential  Good    PT Frequency  Other (comment)    PT Duration  8 weeks    PT Treatment/Interventions  ADLs/Self Care Home Management;Cryotherapy;Electrical Stimulation;Moist Heat;Balance training;Therapeutic exercise;Therapeutic activities;Functional mobility training;Stair training;Gait training;Neuromuscular re-education;Patient/family education;Scar mobilization;Manual techniques;Passive range of motion;Dry needling;Taping    PT Next Visit Plan  FOTO, see if PT can request vasopneumatic for edema, continue with gentle knee strenght/stability, begin step ups and LAQ next visit.     Consulted and Agree with Plan of Care  Patient       Patient will benefit from skilled therapeutic intervention in order to improve the following deficits and impairments:  Abnormal gait, Decreased activity tolerance, Decreased balance, Difficulty walking, Increased edema, Impaired flexibility, Hypomobility, Decreased strength, Decreased range of motion, Decreased endurance, Decreased mobility, Increased muscle spasms, Impaired tone, Pain, Improper body mechanics, Increased fascial restricitons, Decreased scar mobility  Visit Diagnosis: Acute pain of right knee  Stiffness of right knee, not elsewhere classified  Muscle weakness (generalized)     Problem List Patient Active Problem List   Diagnosis Date Noted  . Fever   . Anemia due to chemotherapy 05/07/2017  . Abnormal LFTs 05/07/2017  . History of breast cancer   . Bilateral leg edema   . Enterococcal infection   . S/P revision of total knee, right 04/27/2017  .  Pyogenic arthritis of right knee joint ([place])   . Acute blood loss anemia 12/12/2016  . Right knee abx spacer 12/02/2016  . Physical exam 07/31/2016  . Allergic reaction 07/24/2016  . Drug rash 07/24/2016  . S/P total knee arthroplasty, right 05/31/2016  . 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 Nonhealing surgical wound 05/28/2016  . Sepsis ([place]) 05/10/2016  . UTI (urinary tract infection) 05/10/2016  . Cellulitis of right knee 05/10/2016  . Blood clot in vein   . Wound dehiscence, surgical 04/12/2016  . 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 Vitamin D deficiency 01/25/2016  . Metastatic  cancer to chest wall ([place]) 02/02/2015  . Primary osteoarthritis of right knee 02/01/2015  . Symptomatic anemia 02/01/2015  . Frequent UTI 01/12/2014  . Sinus tachycardia 11/03/2013  . SOB (shortness of breath) 11/03/2013  . Postoperative anemia due to acute blood loss 12/24/2012  .    09/23/2012  . Sinusitis 07/28/2012  . Neuropathy due to chemotherapeutic drug ([place]) 07/25/2011  . 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 Metastatic breast cancer ([place]) 11/12/2010  . Chronic anticoagulation 11/12/2010  . Anxiety, generalized 03/26/2008    [name], PTA 06/01/2017, 3:42 PM  [facility] [address], [place], [place], [address] Phone: [phone]   Fax:  [phone]  Name: [name] MRN: [id number] Date of Birth: 01-23-1956

## 2017-06-02 DIAGNOSIS — T8453XA Infection and inflammatory reaction due to internal right knee prosthesis, initial encounter: Secondary | ICD-10-CM | POA: Diagnosis not present

## 2017-06-02 DIAGNOSIS — Z452 Encounter for adjustment and management of vascular access device: Secondary | ICD-10-CM | POA: Diagnosis not present

## 2017-06-03 ENCOUNTER — Telehealth: Payer: Self-pay | Admitting: *Deleted

## 2017-06-03 ENCOUNTER — Ambulatory Visit: Payer: 59

## 2017-06-03 DIAGNOSIS — R600 Localized edema: Secondary | ICD-10-CM

## 2017-06-03 DIAGNOSIS — R2689 Other abnormalities of gait and mobility: Secondary | ICD-10-CM | POA: Diagnosis not present

## 2017-06-03 DIAGNOSIS — M25561 Pain in right knee: Secondary | ICD-10-CM

## 2017-06-03 DIAGNOSIS — M6281 Muscle weakness (generalized): Secondary | ICD-10-CM | POA: Diagnosis not present

## 2017-06-03 DIAGNOSIS — M25661 Stiffness of right knee, not elsewhere classified: Secondary | ICD-10-CM | POA: Diagnosis not present

## 2017-06-03 NOTE — Therapy (Addendum)
[facility] [address], [place], [place], [address] Phone: [phone]   Fax:  [phone]  Physical Therapy Treatment  Patient Details  Name: [name] MRN: [id number] Date of Birth: 07/23/1955 Referring Provider: Paralee Cancel, MD    Encounter Date: 06/03/2017  PT End of Session - 06/03/17 1106    Visit Number  3    Number of Visits  12    Date for PT Re-Evaluation  07/09/17    Authorization Type  [employer].  Secondary insurance requires authorization -12 visits approved 05/28/17-11/25/17    Authorization - Visit Number  3    Authorization - Number of Visits  12    PT Start Time  1017    PT Stop Time  1113    PT Time Calculation (min)  56 min    Activity Tolerance  Patient tolerated treatment well;No increased pain    Behavior During Therapy  WFL for tasks assessed/performed       Past Medical History:  Diagnosis Date  . 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 Allergic reaction 07/24/2016  . Anemia    needed transfusion spring of 2018  . Arthritis KNEES   right knee, hx. past left knee replacemnt.  . Blood clot in vein    around Portacath right chest-tx. Eliquis about a yr., prior Lovenox.  . Blood dyscrasia   . Chronic anticoagulation HX  PORT-A-CATH CLOT   2010 - HAS BEEN ON LOVENOX SINCE THE CLOT  . Drug rash 07/24/2016  . Elevated blood pressure reading without diagnosis of hypertension    PT MONITORS HER B/P AT HOME  . Heart murmur   . 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 PONV (postoperative nausea and vomiting)    ponv likes scopolamine patch  . Port-A-Cath in place    RIGHT CHEST   . 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 Skin changes related to chemotherapy    per patient she has scabbed over skin circumventing port to r ight upper chest ; denie drainage and state it is due to chemptherapy   . Status post skin flap graft    right chest wall with metastatis right anterior chest -no drainage or open wound.    Past Surgical History:  Procedure Laterality Date  . APPLICATION OF A-CELL OF BACK Right 07/31/2016   Procedure: CELLERATE COLLAGEN PLACEMENT;  Surgeon: Loel Lofty Dillingham, DO;  Location: [facility];  Service: Plastics;  Laterality: Right;  . CHEST WALL TUMOR EXCISION     right  . EXCISIONAL TOTAL KNEE ARTHROPLASTY WITH ANTIBIOTIC SPACERS Right 12/02/2016   Procedure: EXCISIONAL TOTAL KNEE ARTHROPLASTY WITH ANTIBIOTIC SPACERS;  Surgeon: Paralee Cancel, MD;  Location: [facility];  Service: Orthopedics;  Laterality: Right;  90 mins  . hemotoma     evacuation left chest wall  . I&D KNEE WITH POLY EXCHANGE Right 05/28/2016   Procedure: IRRIGATION AND DEBRIDEMENT KNEE WOUND VAC PLACMENT;  Surgeon: Susa Day, MD;  Location: [facility];  Service: Orthopedics;  Laterality: Right;  . I&D KNEE WITH POLY EXCHANGE Right 05/30/2016   Procedure: RADICAL SYNOVECTOMY,IRRIGATION AND DEBRIDEMENT  KNEE WITH POLY EXCHANGE WITH ANTIBIOTIC BEADS, APPLICATION OF WOUND VAC;  Surgeon: Susa Day, MD;  Location: [facility];  Service: Orthopedics;  Laterality: Right;  . INCISION AND DRAINAGE OF WOUND Right 07/31/2016   Procedure: IRRIGATION AND DEBRIDEMENT RIGHT KNEE  WOUND;  Surgeon: Loel Lofty Dillingham, DO;  Location: [facility];  Service: Plastics;  Laterality: Right;  . IR FLUORO GUIDE CV LINE LEFT  04/30/2017  . IR US GUIDE VASC ACCESS LEFT  04/30/2017  . IRRIGATION AND DEBRIDEMENT KNEE Right 04/12/2016   Procedure: IRRIGATION AND DEBRIDEMENT KNEE;  Surgeon: Nicholes Stairs, MD;  Location: [facility];  Service: Orthopedics;  Laterality: Right;  . IRRIGATION AND DEBRIDEMENT KNEE Right 12/16/2016   Procedure: Repeat  irrigation and debridement right knee, wound closure  wound vac REPLACEMENT ANTIBIOTIC SPACERS;  Surgeon: Paralee Cancel, MD;  Location: [facility];  Service: Orthopedics;  Laterality: Right;  . KNEE ARTHROSCOPY  02/13/2012   Procedure: ARTHROSCOPY KNEE;  Surgeon: Johnn Hai, MD;  Location: [facility];  Service: Orthopedics;  Laterality: Left;  WITH DEBRIDEMENt   . KNEE ARTHROSCOPY WITH LATERAL MENISECTOMY  02/13/2012   Procedure: KNEE ARTHROSCOPY WITH LATERAL MENISECTOMY;  Surgeon: Johnn Hai, MD;  Location: [facility];  Service: Orthopedics;;  partial  . LEFT MODIFIED RADICAL MASTECTOMY/ RIGHT TOTAL MASTECTOMY  11-13-2007   LEFT BREAST CANCER W/ AXILLARY LYMPH NODE METASTASIS AND POST NEOADJUVANT CHEMO  . MASTECTOMY     Bilateral  . PLACEMENT PORT-A-CATH  06/22/2007   right chest new port placed 2015, right chest  . REIMPLANTATION OF TOTAL KNEE Right 04/27/2017   Procedure: Reimplantation of right total knee arthroplasty;  Surgeon: Paralee Cancel, MD;  Location: [facility];  Service: Orthopedics;  Laterality: Right;  Adductor Block  . SKIN GRAFT     chest-post tumor removal, second occurrence of cancer" 2015 surgery for Flap 2015"  . SKIN SPLIT GRAFT Right 01/29/2017   Procedure: SKIN GRAFT SPLIT THICKNESS TO RIGHT KNEE WOUND;  Surgeon: Wallace Going, DO;  Location: [facility];  Service: Plastics;  Laterality: Right;  . TOTAL KNEE ARTHROPLASTY Left 09/23/2012   Procedure: LEFT TOTAL KNEE ARTHROPLASTY;  Surgeon: Johnn Hai, MD;  Location: [facility];  Service: Orthopedics;  Laterality: Left;  . TOTAL KNEE ARTHROPLASTY Right 03/20/2016   Procedure: RIGHT TOTAL KNEE ARTHROPLASTY;  Surgeon: Susa Day, MD;  Location: [facility];  Service: Orthopedics;  Laterality: Right;  . TRANSTHORACIC ECHOCARDIOGRAM  11-18-2011  DR BENSIMHON (ECHO EVERY 3 MONTHS)   HX CHEMO INDUCED CARDIOTOXICITY/  LVSF NORMAL/ EF 55-50%/ MILD MITRIAL VALVE REGURG./ MILDLY INCREASED SYSTOLIC  PRESSURE OF PULMONARY ARTERIES  . VASCULAR SURGERY Right    portacath chest    There were no vitals filed for this visit.  Subjective Assessment - 06/03/17 1037    Subjective  I was sore afte last session.  Hematoma is still bothering me.      Currently in Pain?  Yes    Pain Score  4     Pain Location  Knee    Pain Orientation  Right    Pain Descriptors / Indicators  Aching    Pain Type  Surgical pain    Pain Onset  More than a month ago    Pain Frequency  Constant    Aggravating Factors   overdoing it, walking    Pain Relieving Factors  ice, compression, rest  [facility] Adult PT Treatment/Exercise - 06/03/17 0001      Knee/Hip Exercises: Stretches   Active Hamstring Stretch  Right;3 reps;20 seconds      Knee/Hip Exercises: Aerobic   Nustep  L1 x 8 min PTA present for status update      Knee/Hip Exercises: Supine   Quad Sets  Strengthening;Right;2 sets;10 reps    [employer]  Strengthening;Both;2 sets;5 reps    Heel Slides  AAROM;Right;10 reps gentle overpressue by PT    Straight Leg Raises  Strengthening;Right;3 sets;5 sets    Other Supine Knee/Hip Exercises  --      Modalities   Modalities  Cryotherapy      Cryotherapy   Number Minutes Cryotherapy  12 Minutes    Cryotherapy Location  Knee    Type of Cryotherapy  Ice pack               PT Short Term Goals - 06/03/17 1045      PT SHORT TERM GOAL #1   Title  Pt will be independent with her initial HEP to improve strength and mobility.    Status  Achieved      PT SHORT TERM GOAL #2   Title  Pt will be able to complete atleast 20 repetitions of SLR without extension lag, which will improve her safety with ambulation and other activity.     Status  Achieved      PT SHORT TERM GOAL #3   Title  Pt will demo Lt knee AROM flexion to atlast 100 deg which will help with technique during sit to stand.     Baseline  80    Time  3    Period  Weeks    Status  On-going         PT Long Term Goals - 05/28/17 1151      PT LONG TERM GOAL #1   Title  Pt will be independent with her advanced HEP for further improvements in strength and mobility for return to regular activity around the home.     Time  8    Period  Weeks    Status  New    Target Date  07/23/17      PT LONG TERM GOAL #2   Title  Pt will demo improved Rt knee flexion AROM to atleast 110 deg which will allow her to get in/out of a chair more efficiently.     Time  8    Period  Weeks    Status  New      PT LONG TERM GOAL #3   Title  Pt will ambulate without antalgic pattern, noting symmetrical step lengths, proper push off and arm swing, to improve her efficiency with daily activity.     Time  8    Period  Weeks    Status  New      PT LONG TERM GOAL #4   Title  Pt will report improved endurance and activity tolerance evident by her ability to prepare a meal at home without the need for a rest break.     Time  8    Period  Weeks    Status  New      PT LONG TERM GOAL #5   Title  Pt will report atleast 60% improvement in her pain/stiffness/mobility from the start of therapy.     Time  8    Period  Weeks    Status  New  Additional Long Term Goals   Additional Long Term Goals  Yes      PT LONG TERM GOAL #6   Title  Pt will ascend and descend 6" steps with reciprocal pattern and no more than 1 handrail assistance x3 trials, to allow her to safely get to her bedroom upstairs.     Time  8    Period  Weeks    Status  New            Plan - 06/03/17 1050    Clinical Impression Statement  Pt had pain after last session so no advancement in exercises today.  Pt with continued Rt knee hematoma and edema that limits mobility and ROM.  Pt able to perform SLR with quad lag today with fatigue after 5-8 reps.  Pt with improved Rt knee A/ROM flexion this week.  Pt will continue to benefit from skilled PT for Rt knee strength, edema management, ROM and gait.      Rehab Potential  Good    PT  Frequency  Other (comment)    PT Duration  8 weeks    PT Treatment/Interventions  ADLs/Self Care Home Management;Cryotherapy;Electrical Stimulation;Moist Heat;Balance training;Therapeutic exercise;Therapeutic activities;Functional mobility training;Stair training;Gait training;Neuromuscular re-education;Patient/family education;Scar mobilization;Manual techniques;Passive range of motion;Dry needling;Taping    PT Next Visit Plan  FOTO, request vaso for edema after pt sees MD for hematoma assessment, continue with gentle knee strenght/stability, begin step ups    Recommended Other Services  initial POC is signed.    Consulted and Agree with Plan of Care  Patient       Patient will benefit from skilled therapeutic intervention in order to improve the following deficits and impairments:  Abnormal gait, Decreased activity tolerance, Decreased balance, Difficulty walking, Increased edema, Impaired flexibility, Hypomobility, Decreased strength, Decreased range of motion, Decreased endurance, Decreased mobility, Increased muscle spasms, Impaired tone, Pain, Improper body mechanics, Increased fascial restricitons, Decreased scar mobility  Visit Diagnosis: Acute pain of right knee  Stiffness of right knee, not elsewhere classified  Muscle weakness (generalized)  Other abnormalities of gait and mobility  Localized edema     Problem List Patient Active Problem List   Diagnosis Date Noted  . Fever   . Anemia due to chemotherapy 05/07/2017  . Abnormal LFTs 05/07/2017  . History of breast cancer   . Bilateral leg edema   . Enterococcal infection   . S/P revision of total knee, right 04/27/2017  . Pyogenic arthritis of right knee joint ([place])   . Acute blood loss anemia 12/12/2016  . Right knee abx spacer 12/02/2016  . Physical exam 07/31/2016  . Allergic reaction 07/24/2016  . Drug rash 07/24/2016  . S/P total knee arthroplasty, right 05/31/2016  . Prosthetic joint infection, sequela  05/31/2016  . Nonhealing surgical wound 05/28/2016  . Sepsis ([place]) 05/10/2016  . UTI (urinary tract infection) 05/10/2016  . Cellulitis of right knee 05/10/2016  . Blood clot in vein   . Wound dehiscence, surgical 04/12/2016  . 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 Vitamin D deficiency 01/25/2016  . Metastatic cancer to chest wall ([place]) 02/02/2015  . Primary osteoarthritis of right knee 02/01/2015  . Symptomatic anemia 02/01/2015  . Frequent UTI 01/12/2014  . Sinus tachycardia 11/03/2013  . SOB (shortness of breath) 11/03/2013  . Postoperative anemia due to acute blood loss 12/24/2012  .    09/23/2012  . Sinusitis 07/28/2012  . Neuropathy due to chemotherapeutic drug ([place]) 07/25/2011  . 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 Metastatic  breast cancer ([place]) 11/12/2010  . Chronic anticoagulation 11/12/2010  . Anxiety, generalized 03/26/2008    Sigurd Sos, PT 06/03/17 1:17 PM  [facility] [address] [place], [place], [address] Phone: [phone]   Fax:  [phone]  Name: MITA VALLO MRN: [id number] Date of Birth: May 14, 1956

## 2017-06-03 NOTE — Telephone Encounter (Signed)
Low potassium level = 3.3 report received.  reported by RN at 1600.  Dr. Page Spiro wrote order for repeat potassium lab draw on lab result paperwork.  Order called to [facility].  Coretta, pharmacist, read back order.

## 2017-06-04 DIAGNOSIS — Z7689 Persons encountering health services in other specified circumstances: Secondary | ICD-10-CM | POA: Diagnosis not present

## 2017-06-04 DIAGNOSIS — T8453XA Infection and inflammatory reaction due to internal right knee prosthesis, initial encounter: Secondary | ICD-10-CM | POA: Diagnosis not present

## 2017-06-04 DIAGNOSIS — T8453XD Infection and inflammatory reaction due to internal right knee prosthesis, subsequent encounter: Secondary | ICD-10-CM | POA: Diagnosis not present

## 2017-06-05 ENCOUNTER — Ambulatory Visit: Payer: 59 | Admitting: Physical Therapy

## 2017-06-05 ENCOUNTER — Encounter: Payer: Self-pay | Admitting: Physical Therapy

## 2017-06-05 DIAGNOSIS — M25561 Pain in right knee: Secondary | ICD-10-CM

## 2017-06-05 DIAGNOSIS — R600 Localized edema: Secondary | ICD-10-CM

## 2017-06-05 DIAGNOSIS — M25661 Stiffness of right knee, not elsewhere classified: Secondary | ICD-10-CM

## 2017-06-05 DIAGNOSIS — R2689 Other abnormalities of gait and mobility: Secondary | ICD-10-CM

## 2017-06-05 DIAGNOSIS — S21109A Unspecified open wound of unspecified front wall of thorax without penetration into thoracic cavity, initial encounter: Secondary | ICD-10-CM | POA: Diagnosis not present

## 2017-06-05 DIAGNOSIS — D649 Anemia, unspecified: Secondary | ICD-10-CM | POA: Diagnosis not present

## 2017-06-05 DIAGNOSIS — M6281 Muscle weakness (generalized): Secondary | ICD-10-CM

## 2017-06-05 NOTE — Therapy (Signed)
[facility] [address], [place], [place], [address] Phone: [phone]   Fax:  [phone]  Physical Therapy Treatment  Patient Details  Name: [name] MRN: [id number] Date of Birth: 1955-06-12 Referring Provider: Paralee Cancel, MD    Encounter Date: 06/05/2017  PT End of Session - 06/05/17 1004    Visit Number  4    Number of Visits  12    Date for PT Re-Evaluation  07/09/17    Authorization Type  [employer].  Secondary insurance requires authorization -12 visits approved 05/28/17-11/25/17    Authorization Time Period  05/28/17 to 07/23/17    Authorization - Visit Number  4    PT Start Time  1004    PT Stop Time  1057    PT Time Calculation (min)  53 min    Activity Tolerance  Patient tolerated treatment well;No increased pain    Behavior During Therapy  WFL for tasks assessed/performed       Past Medical History:  Diagnosis Date  . 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 Allergic reaction 07/24/2016  . Anemia    needed transfusion spring of 2018  . Arthritis KNEES   right knee, hx. past left knee replacemnt.  . Blood clot in vein    around Portacath right chest-tx. Eliquis about a yr., prior Lovenox.  . Blood dyscrasia   . Chronic anticoagulation HX  PORT-A-CATH CLOT   2010 - HAS BEEN ON LOVENOX SINCE THE CLOT  . Drug rash 07/24/2016  . Elevated blood pressure reading without diagnosis of hypertension    PT MONITORS HER B/P AT HOME  . Heart murmur   . 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 PONV (postoperative nausea and vomiting)    ponv likes scopolamine patch  . Port-A-Cath in place    RIGHT CHEST   . Skin cancer of anterior chest BREAST CANCER PRIMARY W/ METS TO CHEST WALL SKIN CANCER--  CHEMOTHERAPY EVERY 3 WEEKS AT [facility]   left 9 yrs ago, chemo every 3  weeks at [place], last chemo july 3rd  . Skin changes related to chemotherapy    per patient she has scabbed over skin circumventing port to r ight upper chest ; denie drainage and state it is due to chemptherapy   . Status post skin flap graft    right chest wall with metastatis right anterior chest -no drainage or open wound.    Past Surgical History:  Procedure Laterality Date  . APPLICATION OF A-CELL OF BACK Right 07/31/2016   Procedure: CELLERATE COLLAGEN PLACEMENT;  Surgeon: Loel Lofty Dillingham, DO;  Location: [facility];  Service: Plastics;  Laterality: Right;  . CHEST WALL TUMOR EXCISION     right  . EXCISIONAL TOTAL KNEE ARTHROPLASTY WITH ANTIBIOTIC SPACERS Right 12/02/2016   Procedure: EXCISIONAL TOTAL KNEE ARTHROPLASTY WITH ANTIBIOTIC SPACERS;  Surgeon: Paralee Cancel, MD;  Location: [facility];  Service: Orthopedics;  Laterality: Right;  90 mins  . hemotoma     evacuation left chest wall  . I&D KNEE WITH POLY EXCHANGE Right 05/28/2016   Procedure: IRRIGATION AND DEBRIDEMENT KNEE WOUND VAC PLACMENT;  Surgeon: Susa Day, MD;  Location: [facility];  Service: Orthopedics;  Laterality: Right;  . I&D KNEE WITH POLY EXCHANGE Right 05/30/2016   Procedure: RADICAL SYNOVECTOMY,IRRIGATION AND DEBRIDEMENT  KNEE WITH POLY EXCHANGE WITH ANTIBIOTIC BEADS, APPLICATION OF WOUND VAC;  Surgeon: Susa Day, MD;  Location: [facility];  Service: Orthopedics;  Laterality: Right;  . INCISION AND DRAINAGE OF WOUND Right 07/31/2016   Procedure: IRRIGATION AND DEBRIDEMENT RIGHT KNEE  WOUND;  Surgeon: Loel Lofty Dillingham, DO;  Location: [facility];  Service: Plastics;  Laterality: Right;  . IR FLUORO GUIDE CV LINE LEFT  04/30/2017  . IR US GUIDE VASC ACCESS LEFT  04/30/2017  . IRRIGATION AND DEBRIDEMENT KNEE Right 04/12/2016   Procedure: IRRIGATION AND DEBRIDEMENT KNEE;  Surgeon: Nicholes Stairs, MD;  Location: [facility];  Service: Orthopedics;  Laterality: Right;  . IRRIGATION AND DEBRIDEMENT KNEE Right 12/16/2016   Procedure:  Repeat irrigation and debridement right knee, wound closure  wound vac REPLACEMENT ANTIBIOTIC SPACERS;  Surgeon: Paralee Cancel, MD;  Location: [facility];  Service: Orthopedics;  Laterality: Right;  . KNEE ARTHROSCOPY  02/13/2012   Procedure: ARTHROSCOPY KNEE;  Surgeon: Johnn Hai, MD;  Location[facility];  Service: Orthopedics;  Laterality: Left;  WITH DEBRIDEMENt   . KNEE ARTHROSCOPY WITH LATERAL MENISECTOMY  02/13/2012   Procedure: KNEE ARTHROSCOPY WITH LATERAL MENISECTOMY;  Surgeon: Johnn Hai, MD;  Location: [facility];  Service: Orthopedics;;  partial  . LEFT MODIFIED RADICAL MASTECTOMY/ RIGHT TOTAL MASTECTOMY  11-13-2007   LEFT BREAST CANCER W/ AXILLARY LYMPH NODE METASTASIS AND POST NEOADJUVANT CHEMO  . MASTECTOMY     Bilateral  . PLACEMENT PORT-A-CATH  06/22/2007   right chest new port placed 2015, right chest  . REIMPLANTATION OF TOTAL KNEE Right 04/27/2017   Procedure: Reimplantation of right total knee arthroplasty;  Surgeon: Paralee Cancel, MD;  Location: [facility];  Service: Orthopedics;  Laterality: Right;  Adductor Block  . SKIN GRAFT     chest-post tumor removal, second occurrence of cancer" 2015 surgery for Flap 2015"  . SKIN SPLIT GRAFT Right 01/29/2017   Procedure: SKIN GRAFT SPLIT THICKNESS TO RIGHT KNEE WOUND;  Surgeon: Wallace Going, DO;  Location: [facility];  Service: Plastics;  Laterality: Right;  . TOTAL KNEE ARTHROPLASTY Left 09/23/2012   Procedure: LEFT TOTAL KNEE ARTHROPLASTY;  Surgeon: Johnn Hai, MD;  Location: [facility];  Service: Orthopedics;  Laterality: Left;  . TOTAL KNEE ARTHROPLASTY Right 03/20/2016   Procedure: RIGHT TOTAL KNEE ARTHROPLASTY;  Surgeon: Susa Day, MD;  Location: [facility];  Service: Orthopedics;  Laterality: Right;  . TRANSTHORACIC ECHOCARDIOGRAM  11-18-2011  DR BENSIMHON (ECHO EVERY 3 MONTHS)   HX CHEMO INDUCED CARDIOTOXICITY/  LVSF NORMAL/ EF 55-50%/ MILD MITRIAL VALVE REGURG./ MILDLY INCREASED SYSTOLIC  PRESSURE OF PULMONARY ARTERIES  . VASCULAR SURGERY Right    portacath chest    There were no vitals filed for this visit.  Subjective Assessment - 06/05/17 1006    Subjective  I was not as sore after last session. My knee/leg feels a little looser.    Pertinent History  Breast cancer; chemo every 3 weeks     Limitations  Walking;House hold activities    How long can you walk comfortably?  up on her feet for short periods of time only     Currently in Pain?  Yes I think I am just cold from the weather so I tense myself. Knee feels achy    Pain Score  2     Pain Location  Knee    Pain Orientation  Right    Pain Descriptors / Indicators  Aching    Multiple  Pain Sites  No                      [facility] Adult PT Treatment/Exercise - 06/05/17 0001      Knee/Hip Exercises: Aerobic   Nustep  L1 x 10 min PTA present for status update      Knee/Hip Exercises: Standing   Hip Abduction  Stengthening;Both;2 sets;10 reps;Knee straight    Abduction Limitations  added 2#    Forward Step Up  Right;2 sets;10 reps;Hand Hold: 2;Step Height: 4"    Rebounder  weight shifting 1 min 3 ways      Knee/Hip Exercises: Seated   Long Arc Quad  Strengthening;Right;2 sets;10 reps;Weights 2# ADDED    Sit to Sand  2 sets;10 reps;without UE support Seated on black pad      Knee/Hip Exercises: Supine   Short Arc Quad Sets  Strengthening;Right;1 set;10 reps 2# added    Heel Slides  AROM;Right;20 reps    Straight Leg Raises  Strengthening;Right;3 sets;5 reps      Cryotherapy   Number Minutes Cryotherapy  12 Minutes    Cryotherapy Location  Knee    Type of Cryotherapy  Ice pack               PT Short Term Goals - 06/03/17 1045      PT SHORT TERM GOAL #1   Title  Pt will be independent with her initial HEP to improve strength and mobility.    Status  Achieved      PT SHORT TERM GOAL #2   Title  Pt will be able to complete atleast 20 repetitions of SLR without extension lag, which  will improve her safety with ambulation and other activity.     Status  Achieved      PT SHORT TERM GOAL #3   Title  Pt will demo Lt knee AROM flexion to atlast 100 deg which will help with technique during sit to stand.     Baseline  80    Time  3    Period  Weeks    Status  On-going        PT Long Term Goals - 05/28/17 1151      PT LONG TERM GOAL #1   Title  Pt will be independent with her advanced HEP for further improvements in strength and mobility for return to regular activity around the home.     Time  8    Period  Weeks    Status  New    Target Date  07/23/17      PT LONG TERM GOAL #2   Title  Pt will demo improved Rt knee flexion AROM to atleast 110 deg which will allow her to get in/out of a chair more efficiently.     Time  8    Period  Weeks    Status  New      PT LONG TERM GOAL #3   Title  Pt will ambulate without antalgic pattern, noting symmetrical step lengths, proper push off and arm swing, to improve her efficiency with daily activity.     Time  8    Period  Weeks    Status  New      PT LONG TERM GOAL #4   Title  Pt will report improved endurance and activity tolerance evident by her ability to prepare a meal at home without the need for a rest break.     Time  8    Period  Weeks    Status  New      PT LONG TERM GOAL #5   Title  Pt will report atleast 60% improvement in her pain/stiffness/mobility from the start of therapy.     Time  8    Period  Weeks    Status  New      Additional Long Term Goals   Additional Long Term Goals  Yes      PT LONG TERM GOAL #6   Title  Pt will ascend and descend 6" steps with reciprocal pattern and no more than 1 handrail assistance x3 trials, to allow her to safely get to her bedroom upstairs.     Time  8    Period  Weeks    Status  New            Plan - 06/05/17 1004    Clinical Impression Statement  Pt reports today that her balance already feels better and her knee is beginning to feel looser. Weight  was added to a few exercises to further progress knee and hip strength.  Pt was able to perform a very solid straight leg rasie as her quad strengthens in its contraction. Poor endurance as it is difficult to maintain into her second set.    Rehab Potential  Good    PT Frequency  Other (comment)    PT Duration  8 weeks    PT Treatment/Interventions  ADLs/Self Care Home Management;Cryotherapy;Electrical Stimulation;Moist Heat;Balance training;Therapeutic exercise;Therapeutic activities;Functional mobility training;Stair training;Gait training;Neuromuscular re-education;Patient/family education;Scar mobilization;Manual techniques;Passive range of motion;Dry needling;Taping    PT Next Visit Plan  Request vaso for edema after pt sees MD for hematoma assessment, continue with gentle knee strenght/stability. FOTO?    Consulted and Agree with Plan of Care  Patient       Patient will benefit from skilled therapeutic intervention in order to improve the following deficits and impairments:  Abnormal gait, Decreased activity tolerance, Decreased balance, Difficulty walking, Increased edema, Impaired flexibility, Hypomobility, Decreased strength, Decreased range of motion, Decreased endurance, Decreased mobility, Increased muscle spasms, Impaired tone, Pain, Improper body mechanics, Increased fascial restricitons, Decreased scar mobility  Visit Diagnosis: Acute pain of right knee  Stiffness of right knee, not elsewhere classified  Muscle weakness (generalized)  Other abnormalities of gait and mobility  Localized edema     Problem List Patient Active Problem List   Diagnosis Date Noted  . Fever   . Anemia due to chemotherapy 05/07/2017  . Abnormal LFTs 05/07/2017  . History of breast cancer   . Bilateral leg edema   . Enterococcal infection   . S/P revision of total knee, right 04/27/2017  . Pyogenic arthritis of right knee joint ([place])   . Acute blood loss anemia 12/12/2016  . Right knee abx  spacer 12/02/2016  . Physical exam 07/31/2016  . Allergic reaction 07/24/2016  . Drug rash 07/24/2016  . S/P total knee arthroplasty, right 05/31/2016  . 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 Nonhealing surgical wound 05/28/2016  . Sepsis ([place]) 05/10/2016  . UTI (urinary tract infection) 05/10/2016  . Cellulitis of right knee 05/10/2016  . Blood clot in vein   . Wound dehiscence, surgical 04/12/2016  . 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 Vitamin D deficiency 01/25/2016  . Metastatic cancer to chest wall ([place]) 02/02/2015  . Primary osteoarthritis of right knee 02/01/2015  . Symptomatic anemia 02/01/2015  . Frequent UTI 01/12/2014  . Sinus tachycardia 11/03/2013  .  SOB (shortness of breath) 11/03/2013  . Postoperative anemia due to acute blood loss 12/24/2012  .    09/23/2012  . Sinusitis 07/28/2012  . Neuropathy due to chemotherapeutic drug ([place]) 07/25/2011  . 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 Metastatic breast cancer ([place]) 11/12/2010  . Chronic anticoagulation 11/12/2010  . Anxiety, generalized 03/26/2008    Manjinder Breau, PTA 06/05/2017, 10:54 AM  [facility] [address] [place], [place], [address] Phone: [phone]   Fax:  [phone]  Name: [name] MRN: [id number] Date of Birth: April 21, 1956

## 2017-06-06 DIAGNOSIS — B952 Enterococcus as the cause of diseases classified elsewhere: Secondary | ICD-10-CM | POA: Diagnosis not present

## 2017-06-06 DIAGNOSIS — T8459XA Infection and inflammatory reaction due to other internal joint prosthesis, initial encounter: Secondary | ICD-10-CM | POA: Diagnosis not present

## 2017-06-08 DIAGNOSIS — R590 Localized enlarged lymph nodes: Secondary | ICD-10-CM | POA: Diagnosis not present

## 2017-06-08 DIAGNOSIS — C50912 Malignant neoplasm of unspecified site of left female breast: Secondary | ICD-10-CM | POA: Diagnosis not present

## 2017-06-08 DIAGNOSIS — C792 Secondary malignant neoplasm of skin: Secondary | ICD-10-CM | POA: Diagnosis not present

## 2017-06-09 DIAGNOSIS — Z471 Aftercare following joint replacement surgery: Secondary | ICD-10-CM | POA: Diagnosis not present

## 2017-06-09 DIAGNOSIS — C50912 Malignant neoplasm of unspecified site of left female breast: Secondary | ICD-10-CM | POA: Diagnosis not present

## 2017-06-09 DIAGNOSIS — Z7901 Long term (current) use of anticoagulants: Secondary | ICD-10-CM | POA: Diagnosis not present

## 2017-06-09 DIAGNOSIS — C792 Secondary malignant neoplasm of skin: Secondary | ICD-10-CM | POA: Diagnosis not present

## 2017-06-09 DIAGNOSIS — I340 Nonrheumatic mitral (valve) insufficiency: Secondary | ICD-10-CM | POA: Diagnosis not present

## 2017-06-09 DIAGNOSIS — Z96651 Presence of right artificial knee joint: Secondary | ICD-10-CM | POA: Diagnosis not present

## 2017-06-09 DIAGNOSIS — I5020 Unspecified systolic (congestive) heart failure: Secondary | ICD-10-CM | POA: Diagnosis not present

## 2017-06-09 DIAGNOSIS — R748 Abnormal levels of other serum enzymes: Secondary | ICD-10-CM | POA: Diagnosis not present

## 2017-06-09 DIAGNOSIS — T86821 Skin graft (allograft) (autograft) failure: Secondary | ICD-10-CM | POA: Diagnosis not present

## 2017-06-09 DIAGNOSIS — Z171 Estrogen receptor negative status [ER-]: Secondary | ICD-10-CM | POA: Diagnosis not present

## 2017-06-11 DIAGNOSIS — T8453XA Infection and inflammatory reaction due to internal right knee prosthesis, initial encounter: Secondary | ICD-10-CM | POA: Diagnosis not present

## 2017-06-12 ENCOUNTER — Encounter: Payer: Self-pay | Admitting: Physical Therapy

## 2017-06-12 ENCOUNTER — Ambulatory Visit: Payer: 59 | Admitting: Physical Therapy

## 2017-06-12 DIAGNOSIS — M6281 Muscle weakness (generalized): Secondary | ICD-10-CM | POA: Diagnosis not present

## 2017-06-12 DIAGNOSIS — R2689 Other abnormalities of gait and mobility: Secondary | ICD-10-CM

## 2017-06-12 DIAGNOSIS — M25661 Stiffness of right knee, not elsewhere classified: Secondary | ICD-10-CM

## 2017-06-12 DIAGNOSIS — M25561 Pain in right knee: Secondary | ICD-10-CM | POA: Diagnosis not present

## 2017-06-12 DIAGNOSIS — R600 Localized edema: Secondary | ICD-10-CM | POA: Diagnosis not present

## 2017-06-12 NOTE — Therapy (Signed)
[facility] [address], [address] [place], [place], [address] Phone: [phone]   Fax:  [phone]  Physical Therapy Treatment  Patient Details  Name: [name] MRN: [id number] Date of Birth: 03/08/1956 Referring Provider: Matthew Olin, MD    Encounter Date: 06/12/2017  PT End of Session - 06/12/17 1007    Visit Number  5    Number of Visits  12    Date for PT Re-Evaluation  07/09/17    Authorization Type  [employer].  Secondary insurance requires authorization -12 visits approved 05/28/17-11/25/17    Authorization Time Period  05/28/17 to 07/23/17    Authorization - Visit Number  5    Authorization - Number of Visits  12    PT Start Time  1006    PT Stop Time  1105    PT Time Calculation (min)  59 min    Activity Tolerance  Patient tolerated treatment well;No increased pain    Behavior During Therapy  WFL for tasks assessed/performed       Past Medical History:  Diagnosis Date  . 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 Allergic reaction 07/24/2016  . Anemia    needed transfusion spring of 2018  . Arthritis KNEES   right knee, hx. past left knee replacemnt.  . Blood clot in vein    around Portacath right chest-tx. Eliquis about a yr., prior Lovenox.  . Blood dyscrasia   . Chronic anticoagulation HX  PORT-A-CATH CLOT   2010 - HAS BEEN ON LOVENOX SINCE THE CLOT  . Drug rash 07/24/2016  . Elevated blood pressure reading without diagnosis of hypertension    PT MONITORS HER B/P AT HOME  . Heart murmur   . 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 PONV (postoperative nausea and vomiting)    ponv likes scopolamine patch  . Port-A-Cath in place    RIGHT CHEST   . 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 Skin changes related to chemotherapy    per patient she has scabbed over skin circumventing port to r ight upper chest ; denie drainage and state it is due to chemptherapy   . Status post skin flap graft    right chest wall with metastatis right anterior chest -no drainage or open wound.    Past Surgical History:  Procedure Laterality Date  . APPLICATION OF A-CELL OF BACK Right 07/31/2016   Procedure: CELLERATE COLLAGEN PLACEMENT;  Surgeon: Claire S Dillingham, DO;  Location: [facility];  Service: Plastics;  Laterality: Right;  . CHEST WALL TUMOR EXCISION     right  . EXCISIONAL TOTAL KNEE ARTHROPLASTY WITH ANTIBIOTIC SPACERS Right 12/02/2016   Procedure: EXCISIONAL TOTAL KNEE ARTHROPLASTY WITH ANTIBIOTIC SPACERS;  Surgeon: Olin, Matthew, MD;  Location: [facility];  Service: Orthopedics;  Laterality: Right;  90 mins  . hemotoma     evacuation left chest wall  . I&D KNEE WITH POLY EXCHANGE Right 05/28/2016   Procedure: IRRIGATION AND DEBRIDEMENT KNEE WOUND VAC PLACMENT;  Surgeon: Jeffrey Beane, MD;  Location: [facility];  Service: Orthopedics;  Laterality: Right;  . I&D KNEE WITH POLY   EXCHANGE Right 05/30/2016   Procedure: RADICAL SYNOVECTOMY,IRRIGATION AND DEBRIDEMENT KNEE WITH POLY EXCHANGE WITH ANTIBIOTIC BEADS, APPLICATION OF WOUND VAC;  Surgeon: Susa Day, MD;  Location: [facility];  Service: Orthopedics;  Laterality: Right;  . INCISION AND DRAINAGE OF WOUND Right 07/31/2016   Procedure: IRRIGATION AND DEBRIDEMENT RIGHT KNEE  WOUND;  Surgeon: Loel Lofty Dillingham, DO;  Location: [facility];  Service: Plastics;  Laterality: Right;  . IR FLUORO GUIDE CV LINE LEFT  04/30/2017  . IR US GUIDE VASC ACCESS LEFT  04/30/2017  . IRRIGATION AND DEBRIDEMENT KNEE Right 04/12/2016   Procedure: IRRIGATION AND DEBRIDEMENT KNEE;  Surgeon: Nicholes Stairs, MD;  Location: [facility];  Service: Orthopedics;  Laterality: Right;  . IRRIGATION AND DEBRIDEMENT  KNEE Right 12/16/2016   Procedure: Repeat irrigation and debridement right knee, wound closure  wound vac REPLACEMENT ANTIBIOTIC SPACERS;  Surgeon: Paralee Cancel, MD;  Location: [facility];  Service: Orthopedics;  Laterality: Right;  . KNEE ARTHROSCOPY  02/13/2012   Procedure: ARTHROSCOPY KNEE;  Surgeon: Johnn Hai, MD;  Location: [facility];  Service: Orthopedics;  Laterality: Left;  WITH DEBRIDEMENt   . KNEE ARTHROSCOPY WITH LATERAL MENISECTOMY  02/13/2012   Procedure: KNEE ARTHROSCOPY WITH LATERAL MENISECTOMY;  Surgeon: Johnn Hai, MD;  Location: [facility];  Service: Orthopedics;;  partial  . LEFT MODIFIED RADICAL MASTECTOMY/ RIGHT TOTAL MASTECTOMY  11-13-2007   LEFT BREAST CANCER W/ AXILLARY LYMPH NODE METASTASIS AND POST NEOADJUVANT CHEMO  . MASTECTOMY     Bilateral  . PLACEMENT PORT-A-CATH  06/22/2007   right chest new port placed 2015, right chest  . REIMPLANTATION OF TOTAL KNEE Right 04/27/2017   Procedure: Reimplantation of right total knee arthroplasty;  Surgeon: Paralee Cancel, MD;  Location: [facility];  Service: Orthopedics;  Laterality: Right;  Adductor Block  . SKIN GRAFT     chest-post tumor removal, second occurrence of cancer" 2015 surgery for Flap 2015"  . SKIN SPLIT GRAFT Right 01/29/2017   Procedure: SKIN GRAFT SPLIT THICKNESS TO RIGHT KNEE WOUND;  Surgeon: Wallace Going, DO;  Location: [facility];  Service: Plastics;  Laterality: Right;  . TOTAL KNEE ARTHROPLASTY Left 09/23/2012   Procedure: LEFT TOTAL KNEE ARTHROPLASTY;  Surgeon: Johnn Hai, MD;  Location: [facility];  Service: Orthopedics;  Laterality: Left;  . TOTAL KNEE ARTHROPLASTY Right 03/20/2016   Procedure: RIGHT TOTAL KNEE ARTHROPLASTY;  Surgeon: Susa Day, MD;  Location: [facility];  Service: Orthopedics;  Laterality: Right;  . TRANSTHORACIC ECHOCARDIOGRAM  11-18-2011  DR BENSIMHON (ECHO EVERY 3 MONTHS)   HX CHEMO INDUCED CARDIOTOXICITY/  LVSF NORMAL/ EF 55-50%/ MILD MITRIAL VALVE  REGURG./ MILDLY INCREASED SYSTOLIC PRESSURE OF PULMONARY ARTERIES  . VASCULAR SURGERY Right    portacath chest    There were no vitals filed for this visit.  Subjective Assessment - 06/12/17 1009    Subjective  Saw the PA and he drained the hematoma some, then she had some bedrest. Will see MD next week. No comment on the [employer].     Pertinent History  Breast cancer; chemo every 3 weeks     Limitations  Walking;House hold activities    How long can you walk comfortably?  up on her feet for short periods of time only     Currently in Pain?  Yes    Pain Score  2     Pain Location  Knee    Pain Orientation  Right    Pain Descriptors / Indicators  Dull    Aggravating Factors   Pain has not been too bad, maybe if she overdoes activity.    Pain Relieving Factors  ice, rest    Multiple Pain Sites  No         [facility] PT Assessment - 06/12/17 0001      AROM   Right Knee Flexion  97                  [facility] Adult PT Treatment/Exercise - 06/12/17 0001      Knee/Hip Exercises: Aerobic   Nustep  L1-2 increased at 6 min, total 10 min  PTA present for status      Knee/Hip Exercises: Standing   Knee Flexion  AROM;Strengthening;Both;2 sets;10 reps 2#    Hip Abduction  Stengthening;Both;2 sets;15 reps;Knee straight    Abduction Limitations  2#    Forward Step Up  Right;2 sets;10 reps;Hand Hold: 2;Step Height: 6" UE support was really light    Rebounder  weight shifting 1 min 3 ways    Walking with Sports Cord  20# 6x forward and back light CGA, VC for bigger back steps      Knee/Hip Exercises: Seated   Long Arc Quad  Strengthening;Right;3 sets;10 reps;Weights    Long Arc Quad Weight  3 lbs.    Sit to Sand  2 sets;10 reps;without UE support Seated on black pad      Cryotherapy   Number Minutes Cryotherapy  15 Minutes    Cryotherapy Location  Knee    Type of Cryotherapy  Ice pack               PT Short Term Goals - 06/12/17 1014      PT SHORT TERM GOAL #4    Title  Pt will report atleast 25% reduction in pain for improved activity completion and standing tolerance at home.     Time  3    Period  Weeks    Status  Achieved 25%-30% at least      PT SHORT TERM GOAL #5   Title  Pt will demo proper mechanics with sit to stand, without significant weight shift Lt and no more than 1 UE support for safety.     Time  3    Period  Weeks    Status  Achieved        PT Long Term Goals - 05/28/17 1151      PT LONG TERM GOAL #1   Title  Pt will be independent with her advanced HEP for further improvements in strength and mobility for return to regular activity around the home.     Time  8    Period  Weeks    Status  New    Target Date  07/23/17      PT LONG TERM GOAL #2   Title  Pt will demo improved Rt knee flexion AROM to atleast 110 deg which will allow her to get in/out of a chair more efficiently.     Time  8    Period  Weeks    Status  New      PT LONG TERM GOAL #3   Title  Pt will ambulate without antalgic pattern, noting symmetrical step lengths, proper push off and arm swing, to improve her efficiency with daily activity.     Time  8    Period  Weeks    Status  New      PT LONG TERM GOAL #4     Title  Pt will report improved endurance and activity tolerance evident by her ability to prepare a meal at home without the need for a rest break.     Time  8    Period  Weeks    Status  New      PT LONG TERM GOAL #5   Title  Pt will report atleast 60% improvement in her pain/stiffness/mobility from the start of therapy.     Time  8    Period  Weeks    Status  New      Additional Long Term Goals   Additional Long Term Goals  Yes      PT LONG TERM GOAL #6   Title  Pt will ascend and descend 6" steps with reciprocal pattern and no more than 1 handrail assistance x3 trials, to allow her to safely get to her bedroom upstairs.     Time  8    Period  Weeks    Status  New            Plan - 06/12/17 1008    Clinical Impression  Statement  Pt has been able to do a little vacuuming this week. We increased step up height today as well as as reps for functional activity tolerance and load on LAQ exercise. Since last AROM measurement pt increased her flexion to 97 degrees. Knee remains swollen, bruised and stiff. Pain was not a limiting factor during treatment.      Rehab Potential  Good    PT Frequency  Other (comment)    PT Duration  8 weeks    PT Treatment/Interventions  ADLs/Self Care Home Management;Cryotherapy;Electrical Stimulation;Moist Heat;Balance training;Therapeutic exercise;Therapeutic activities;Functional mobility training;Stair training;Gait training;Neuromuscular re-education;Patient/family education;Scar mobilization;Manual techniques;Passive range of motion;Dry needling;Taping    PT Next Visit Plan  Request vaso for edema after pt sees MD for hematoma assessment, knee strength, hip strength, does pt need to do FOTO ??    Consulted and Agree with Plan of Care  Patient       Patient will benefit from skilled therapeutic intervention in order to improve the following deficits and impairments:  Abnormal gait, Decreased activity tolerance, Decreased balance, Difficulty walking, Increased edema, Impaired flexibility, Hypomobility, Decreased strength, Decreased range of motion, Decreased endurance, Decreased mobility, Increased muscle spasms, Impaired tone, Pain, Improper body mechanics, Increased fascial restricitons, Decreased scar mobility  Visit Diagnosis: Acute pain of right knee  Stiffness of right knee, not elsewhere classified  Muscle weakness (generalized)  Other abnormalities of gait and mobility  Localized edema     Problem List Patient Active Problem List   Diagnosis Date Noted  . Fever   . Anemia due to chemotherapy 05/07/2017  . Abnormal LFTs 05/07/2017  . History of breast cancer   . Bilateral leg edema   . Enterococcal infection   . S/P revision of total knee, right 04/27/2017  .  Pyogenic arthritis of right knee joint ([place])   . Acute blood loss anemia 12/12/2016  . Right knee abx spacer 12/02/2016  . Physical exam 07/31/2016  . Allergic reaction 07/24/2016  . Drug rash 07/24/2016  . S/P total knee arthroplasty, right 05/31/2016  . 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 Nonhealing surgical wound 05/28/2016  . Sepsis ([place]) 05/10/2016  . UTI (urinary tract infection) 05/10/2016  . Cellulitis of right knee 05/10/2016  . Blood clot in vein   . Wound dehiscence, surgical 04/12/2016  . 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 Vitamin D deficiency 01/25/2016  .  Metastatic cancer to chest wall (HCC) 02/02/2015  . Primary osteoarthritis of right knee 02/01/2015  . Symptomatic anemia 02/01/2015  . Frequent UTI 01/12/2014  . Sinus tachycardia 11/03/2013  . SOB (shortness of breath) 11/03/2013  . Postoperative anemia due to acute blood loss 12/24/2012  .    09/23/2012  . Sinusitis 07/28/2012  . 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 Metastatic breast cancer (HCC) 11/12/2010  . Chronic anticoagulation 11/12/2010  . Anxiety, generalized 03/26/2008    COCHRAN,JENNIFER, PTA 06/12/2017, 10:50 AM  [facility] [address], [address] , [place], [address] Phone: [phone]   Fax:  [phone]  Name: [name] MRN: [id number] Date of Birth: 09/19/1955   

## 2017-06-15 ENCOUNTER — Ambulatory Visit: Payer: 59

## 2017-06-15 DIAGNOSIS — R600 Localized edema: Secondary | ICD-10-CM

## 2017-06-15 DIAGNOSIS — R2689 Other abnormalities of gait and mobility: Secondary | ICD-10-CM | POA: Diagnosis not present

## 2017-06-15 DIAGNOSIS — M25561 Pain in right knee: Secondary | ICD-10-CM

## 2017-06-15 DIAGNOSIS — M6281 Muscle weakness (generalized): Secondary | ICD-10-CM

## 2017-06-15 DIAGNOSIS — M25661 Stiffness of right knee, not elsewhere classified: Secondary | ICD-10-CM | POA: Diagnosis not present

## 2017-06-15 MED FILL — DARIFENACIN ER 15 MG TABLET: 15 | 30 days supply | Qty: 30 | Fill #6

## 2017-06-15 NOTE — Therapy (Signed)
[facility] [address], [place], [place], [address] Phone: [phone]   Fax:  [phone]  Physical Therapy Treatment  Patient Details  Name: [name] MRN: [id number] Date of Birth: 1955-07-26 Referring Provider: Paralee Cancel, MD    Encounter Date: 06/15/2017  PT End of Session - 06/15/17 1102    Visit Number  6    Number of Visits  12    Date for PT Re-Evaluation  07/09/17    Authorization Type   Employee/[employer].  Secondary insurance requires authorization -12 visits approved 05/28/17-11/25/17    PT Start Time  5643    PT Stop Time  1114    PT Time Calculation (min)  51 min    Activity Tolerance  Patient tolerated treatment well;No increased pain    Behavior During Therapy  WFL for tasks assessed/performed       Past Medical History:  Diagnosis Date  . 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 Allergic reaction 07/24/2016  . Anemia    needed transfusion spring of 2018  . Arthritis KNEES   right knee, hx. past left knee replacemnt.  . Blood clot in vein    around Portacath right chest-tx. Eliquis about a yr., prior Lovenox.  . Blood dyscrasia   . Chronic anticoagulation HX  PORT-A-CATH CLOT   2010 - HAS BEEN ON LOVENOX SINCE THE CLOT  . Drug rash 07/24/2016  . Elevated blood pressure reading without diagnosis of hypertension    PT MONITORS HER B/P AT HOME  . Heart murmur   . 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 PONV (postoperative nausea and vomiting)    ponv likes scopolamine patch  . Port-A-Cath in place    RIGHT CHEST   . Skin cancer of anterior chest BREAST CANCER PRIMARY W/ METS TO CHEST WALL SKIN CANCER--  CHEMOTHERAPY EVERY 3 WEEKS AT [facility]   left 9 yrs ago, chemo every 3 weeks at [place], last chemo july 3rd  . Skin changes related to chemotherapy    per  patient she has scabbed over skin circumventing port to r ight upper chest ; denie drainage and state it is due to chemptherapy   . Status post skin flap graft    right chest wall with metastatis right anterior chest -no drainage or open wound.    Past Surgical History:  Procedure Laterality Date  . APPLICATION OF A-CELL OF BACK Right 07/31/2016   Procedure: CELLERATE COLLAGEN PLACEMENT;  Surgeon: Loel Lofty Dillingham, DO;  Location: [facility];  Service: Plastics;  Laterality: Right;  . CHEST WALL TUMOR EXCISION     right  . EXCISIONAL TOTAL KNEE ARTHROPLASTY WITH ANTIBIOTIC SPACERS Right 12/02/2016   Procedure: EXCISIONAL TOTAL KNEE ARTHROPLASTY WITH ANTIBIOTIC SPACERS;  Surgeon: Paralee Cancel, MD;  Location: [facility];  Service: Orthopedics;  Laterality: Right;  90 mins  . hemotoma     evacuation left chest wall  . I&D KNEE WITH POLY EXCHANGE Right 05/28/2016   Procedure: IRRIGATION AND DEBRIDEMENT KNEE WOUND VAC PLACMENT;  Surgeon: Susa Day, MD;  Location: [facility];  Service: Orthopedics;  Laterality: Right;  . I&D KNEE WITH POLY EXCHANGE Right 05/30/2016   Procedure: RADICAL SYNOVECTOMY,IRRIGATION AND DEBRIDEMENT KNEE WITH POLY EXCHANGE WITH ANTIBIOTIC BEADS, APPLICATION OF WOUND VAC;  Surgeon: Susa Day, MD;  Location: [facility];  Service: Orthopedics;  Laterality: Right;  . INCISION AND DRAINAGE OF WOUND Right 07/31/2016   Procedure: IRRIGATION AND DEBRIDEMENT RIGHT KNEE  WOUND;  Surgeon: Loel Lofty Dillingham, DO;  Location: [facility];  Service: Plastics;  Laterality: Right;  . IR FLUORO GUIDE CV LINE LEFT  04/30/2017  . IR US GUIDE VASC ACCESS LEFT  04/30/2017  . IRRIGATION AND DEBRIDEMENT KNEE Right 04/12/2016   Procedure: IRRIGATION AND DEBRIDEMENT KNEE;  Surgeon: Nicholes Stairs, MD;  Location: [facility];  Service: Orthopedics;  Laterality: Right;  . IRRIGATION AND DEBRIDEMENT KNEE Right 12/16/2016   Procedure: Repeat irrigation and debridement right knee, wound closure  wound vac REPLACEMENT  ANTIBIOTIC SPACERS;  Surgeon: Paralee Cancel, MD;  Location: [facility];  Service: Orthopedics;  Laterality: Right;  . KNEE ARTHROSCOPY  02/13/2012   Procedure: ARTHROSCOPY KNEE;  Surgeon: Johnn Hai, MD;  Location: [facility];  Service: Orthopedics;  Laterality: Left;  WITH DEBRIDEMENt   . KNEE ARTHROSCOPY WITH LATERAL MENISECTOMY  02/13/2012   Procedure: KNEE ARTHROSCOPY WITH LATERAL MENISECTOMY;  Surgeon: Johnn Hai, MD;  Location: [place];  Service: Orthopedics;;  partial  . LEFT MODIFIED RADICAL MASTECTOMY/ RIGHT TOTAL MASTECTOMY  11-13-2007   LEFT BREAST CANCER W/ AXILLARY LYMPH NODE METASTASIS AND POST NEOADJUVANT CHEMO  . MASTECTOMY     Bilateral  . PLACEMENT PORT-A-CATH  06/22/2007   right chest new port placed 2015, right chest  . REIMPLANTATION OF TOTAL KNEE Right 04/27/2017   Procedure: Reimplantation of right total knee arthroplasty;  Surgeon: Paralee Cancel, MD;  Location: [facility];  Service: Orthopedics;  Laterality: Right;  Adductor Block  . SKIN GRAFT     chest-post tumor removal, second occurrence of cancer" 2015 surgery for Flap 2015"  . SKIN SPLIT GRAFT Right 01/29/2017   Procedure: SKIN GRAFT SPLIT THICKNESS TO RIGHT KNEE WOUND;  Surgeon: Wallace Going, DO;  Location: [facility];  Service: Plastics;  Laterality: Right;  . TOTAL KNEE ARTHROPLASTY Left 09/23/2012   Procedure: LEFT TOTAL KNEE ARTHROPLASTY;  Surgeon: Johnn Hai, MD;  Location[facility];  Service: Orthopedics;  Laterality: Left;  . TOTAL KNEE ARTHROPLASTY Right 03/20/2016   Procedure: RIGHT TOTAL KNEE ARTHROPLASTY;  Surgeon: Susa Day, MD;  Location: [facility];  Service: Orthopedics;  Laterality: Right;  . TRANSTHORACIC ECHOCARDIOGRAM  11-18-2011  DR BENSIMHON (ECHO EVERY 3 MONTHS)   HX CHEMO INDUCED CARDIOTOXICITY/  LVSF NORMAL/ EF 55-50%/ MILD MITRIAL VALVE REGURG./ MILDLY INCREASED SYSTOLIC PRESSURE OF PULMONARY ARTERIES  . VASCULAR SURGERY Right    portacath chest     There were no vitals filed for this visit.  Subjective Assessment - 06/15/17 1031    Subjective  I still have the hematoma.  I will see the MD on Thursday.      Currently in Pain?  Yes    Pain Score  2     Pain Location  Knee    Pain Orientation  Right    Pain Descriptors / Indicators  Tightness;Sore    Pain Type  Surgical pain    Pain Onset  More than a month ago    Pain Frequency  Constant    Aggravating Factors   morning hours, activity    Pain Relieving Factors  ice, rest                      [facility] Adult PT Treatment/Exercise - 06/15/17 0001      Knee/Hip Exercises:  Aerobic   Nustep  L1-2 increased at 6 min, total 7 min  PTA present for status      Knee/Hip Exercises: Standing   Hip Abduction  Stengthening;Both;2 sets;15 reps;Knee straight    Abduction Limitations  2#    Forward Step Up  Right;2 sets;10 reps;Hand Hold: 2;Step Height: 6" UE support was really light    Rebounder  weight shifting 1 min 3 ways      Knee/Hip Exercises: Seated   Sit to Sand  2 sets;10 reps;without UE support Seated on black pad      Knee/Hip Exercises: Supine   Short Arc Quad Sets  Strengthening;Right;1 set;10 reps 2# added    Heel Slides  AROM;Right;20 reps    Straight Leg Raises  Strengthening;Right;3 sets;5 reps      Cryotherapy   Number Minutes Cryotherapy  15 Minutes    Cryotherapy Location  Knee    Type of Cryotherapy  Ice pack               PT Short Term Goals - 06/15/17 1032      PT SHORT TERM GOAL #1   Title  Pt will be independent with her initial HEP to improve strength and mobility.    Status  Achieved      PT SHORT TERM GOAL #2   Title  Pt will be able to complete atleast 20 repetitions of SLR without extension lag, which will improve her safety with ambulation and other activity.     Baseline  quad lag with 10 reps    Time  3    Period  Weeks    Status  On-going      PT SHORT TERM GOAL #3   Title  Pt will demo Lt knee AROM flexion to  atlast 100 deg which will help with technique during sit to stand.     Time  3    Period  Weeks    Status  On-going        PT Long Term Goals - 05/28/17 1151      PT LONG TERM GOAL #1   Title  Pt will be independent with her advanced HEP for further improvements in strength and mobility for return to regular activity around the home.     Time  8    Period  Weeks    Status  New    Target Date  07/23/17      PT LONG TERM GOAL #2   Title  Pt will demo improved Rt knee flexion AROM to atleast 110 deg which will allow her to get in/out of a chair more efficiently.     Time  8    Period  Weeks    Status  New      PT LONG TERM GOAL #3   Title  Pt will ambulate without antalgic pattern, noting symmetrical step lengths, proper push off and arm swing, to improve her efficiency with daily activity.     Time  8    Period  Weeks    Status  New      PT LONG TERM GOAL #4   Title  Pt will report improved endurance and activity tolerance evident by her ability to prepare a meal at home without the need for a rest break.     Time  8    Period  Weeks    Status  New      PT LONG TERM GOAL #5   Title  Pt will report atleast 60% improvement in her pain/stiffness/mobility from the start of therapy.     Time  8    Period  Weeks    Status  New      Additional Long Term Goals   Additional Long Term Goals  Yes      PT LONG TERM GOAL #6   Title  Pt will ascend and descend 6" steps with reciprocal pattern and no more than 1 handrail assistance x3 trials, to allow her to safely get to her bedroom upstairs.     Time  8    Period  Weeks    Status  New            Plan - 06/15/17 1033    Clinical Impression Statement  Pt was able to ascend the steps to take a shower over the weekend.  Pt had her hematoma drained last week with little change in its size.  Pt with increased step height and quad control today with exericse.  Pt demonstrates reduced quad control with 2nd set of 10 with step-ups  and with SLR.  Rt knee flexion was 97 last week.  Pt will see MD this week to discuss hematoma.  PT will send note to MD next visit.  Pt will continue to benefit from skilled PT for Rt LE strength, gait, flexibility and proprioception.      Rehab Potential  Good    PT Duration  8 weeks    PT Treatment/Interventions  ADLs/Self Care Home Management;Cryotherapy;Electrical Stimulation;Moist Heat;Balance training;Therapeutic exercise;Therapeutic activities;Functional mobility training;Stair training;Gait training;Neuromuscular re-education;Patient/family education;Scar mobilization;Manual techniques;Passive range of motion;Dry needling;Taping    PT Next Visit Plan  Request vaso for edema after pt sees MD for hematoma assessment, knee strength, hip strength, MD note- do FOTO and measurements    Consulted and Agree with Plan of Care  Patient       Patient will benefit from skilled therapeutic intervention in order to improve the following deficits and impairments:  Abnormal gait, Decreased activity tolerance, Decreased balance, Difficulty walking, Increased edema, Impaired flexibility, Hypomobility, Decreased strength, Decreased range of motion, Decreased endurance, Decreased mobility, Increased muscle spasms, Impaired tone, Pain, Improper body mechanics, Increased fascial restricitons, Decreased scar mobility  Visit Diagnosis: Acute pain of right knee  Stiffness of right knee, not elsewhere classified  Muscle weakness (generalized)  Other abnormalities of gait and mobility  Localized edema     Problem List Patient Active Problem List   Diagnosis Date Noted  . Fever   . Anemia due to chemotherapy 05/07/2017  . Abnormal LFTs 05/07/2017  . History of breast cancer   . Bilateral leg edema   . Enterococcal infection   . S/P revision of total knee, right 04/27/2017  . Pyogenic arthritis of right knee joint ([place])   . Acute blood loss anemia 12/12/2016  . Right knee abx spacer 12/02/2016  .  Physical exam 07/31/2016  . Allergic reaction 07/24/2016  . Drug rash 07/24/2016  . S/P total knee arthroplasty, right 05/31/2016  . 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 Nonhealing surgical wound 05/28/2016  . Sepsis ([place]) 05/10/2016  . UTI (urinary tract infection) 05/10/2016  . Cellulitis of right knee 05/10/2016  . Blood clot in vein   . Wound dehiscence, surgical 04/12/2016  . 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 Vitamin D deficiency 01/25/2016  . Metastatic cancer to chest wall ([place]) 02/02/2015  . Primary osteoarthritis of right knee 02/01/2015  . Symptomatic anemia 02/01/2015  . Frequent  UTI 01/12/2014  . Sinus tachycardia 11/03/2013  . SOB (shortness of breath) 11/03/2013  . Postoperative anemia due to acute blood loss 12/24/2012  .    09/23/2012  . Sinusitis 07/28/2012  . Neuropathy due to chemotherapeutic drug ([place]) 07/25/2011  . 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 Metastatic breast cancer ([place]) 11/12/2010  . Chronic anticoagulation 11/12/2010  . Anxiety, generalized 03/26/2008    Sigurd Sos, PT 06/15/17 11:06 AM  [facility] [address] [place], [place], [address] Phone: [phone]   Fax:  [phone]  Name: [name] MRN: [id number] Date of Birth: [birth year]

## 2017-06-16 DIAGNOSIS — Z5112 Encounter for antineoplastic immunotherapy: Secondary | ICD-10-CM | POA: Diagnosis not present

## 2017-06-16 DIAGNOSIS — C50412 Malignant neoplasm of upper-outer quadrant of left female breast: Secondary | ICD-10-CM | POA: Diagnosis not present

## 2017-06-16 DIAGNOSIS — C792 Secondary malignant neoplasm of skin: Secondary | ICD-10-CM | POA: Diagnosis not present

## 2017-06-17 ENCOUNTER — Ambulatory Visit: Payer: 59

## 2017-06-17 DIAGNOSIS — R2689 Other abnormalities of gait and mobility: Secondary | ICD-10-CM | POA: Diagnosis not present

## 2017-06-17 DIAGNOSIS — R600 Localized edema: Secondary | ICD-10-CM

## 2017-06-17 DIAGNOSIS — M25661 Stiffness of right knee, not elsewhere classified: Secondary | ICD-10-CM | POA: Diagnosis not present

## 2017-06-17 DIAGNOSIS — M6281 Muscle weakness (generalized): Secondary | ICD-10-CM | POA: Diagnosis not present

## 2017-06-17 DIAGNOSIS — M25561 Pain in right knee: Secondary | ICD-10-CM

## 2017-06-17 NOTE — Therapy (Signed)
[facility] [address] [place], [place], [address] Phone: [phone]   Fax:  [phone]  Physical Therapy Treatment  Patient Details  Name: [name] MRN: [id number] Date of Birth: [birth year] Referring Provider: Paralee Cancel, MD    Encounter Date: 06/17/2017  PT End of Session - 06/17/17 1047    Visit Number  7    Number of Visits  12    Date for PT Re-Evaluation  07/09/17    Authorization Type  [employer].  Secondary insurance requires authorization -12 visits approved 05/28/17-11/25/17    Authorization Time Period  05/28/17 to 07/23/17    Authorization - Visit Number  7    Authorization - Number of Visits  12    PT Start Time  4142    PT Stop Time  1106    PT Time Calculation (min)  59 min    Activity Tolerance  Patient tolerated treatment well;No increased pain    Behavior During Therapy  WFL for tasks assessed/performed       Past Medical History:  Diagnosis Date  . 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 Allergic reaction 07/24/2016  . Anemia    needed transfusion spring of 2018  . Arthritis KNEES   right knee, hx. past left knee replacemnt.  . Blood clot in vein    around Portacath right chest-tx. Eliquis about a yr., prior Lovenox.  . Blood dyscrasia   . Chronic anticoagulation HX  PORT-A-CATH CLOT   2010 - HAS BEEN ON LOVENOX SINCE THE CLOT  . Drug rash 07/24/2016  . Elevated blood pressure reading without diagnosis of hypertension    PT MONITORS HER B/P AT HOME  . Heart murmur   . 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 PONV (postoperative nausea and vomiting)    ponv likes scopolamine patch  . Port-A-Cath in place    RIGHT CHEST   . Skin cancer of anterior chest BREAST CANCER PRIMARY W/ METS TO CHEST WALL SKIN CANCER--  CHEMOTHERAPY EVERY 3 WEEKS AT [facility]   left 9 yrs ago, chemo every 3 weeks at [facility], last chemo july 3rd  . Skin changes related to chemotherapy    per patient she has scabbed over skin circumventing port to r ight upper chest ; denie drainage and state it is due to chemptherapy   . Status post skin flap graft    right chest wall with metastatis right anterior chest -no drainage or open wound.    Past Surgical History:  Procedure Laterality Date  . APPLICATION OF A-CELL OF BACK Right 07/31/2016   Procedure: CELLERATE COLLAGEN PLACEMENT;  Surgeon: Loel Lofty Dillingham, DO;  Location: [facility];  Service: Plastics;  Laterality: Right;  . CHEST WALL TUMOR EXCISION     right  . EXCISIONAL TOTAL KNEE ARTHROPLASTY WITH ANTIBIOTIC SPACERS Right 12/02/2016   Procedure: EXCISIONAL TOTAL KNEE ARTHROPLASTY WITH ANTIBIOTIC SPACERS;  Surgeon: Paralee Cancel, MD;  Location: [facility];  Service: Orthopedics;  Laterality: Right;  90 mins  . hemotoma     evacuation left chest wall  . I&D KNEE WITH POLY EXCHANGE Right 05/28/2016   Procedure: IRRIGATION AND DEBRIDEMENT KNEE WOUND VAC PLACMENT;  Surgeon: Susa Day, MD;  Location: [facility];  Service: Orthopedics;  Laterality: Right;  . I&D KNEE WITH POLY  EXCHANGE Right 05/30/2016   Procedure: RADICAL SYNOVECTOMY,IRRIGATION AND DEBRIDEMENT KNEE WITH POLY EXCHANGE WITH ANTIBIOTIC BEADS, APPLICATION OF WOUND VAC;  Surgeon: Susa Day, MD;  Location: [facility];  Service: Orthopedics;  Laterality: Right;  . INCISION AND DRAINAGE OF WOUND Right 07/31/2016   Procedure: IRRIGATION AND DEBRIDEMENT RIGHT KNEE  WOUND;  Surgeon: Loel Lofty Dillingham, DO;  Location: [facility];  Service: Plastics;  Laterality: Right;  . IR FLUORO GUIDE CV LINE LEFT  04/30/2017  . IR US GUIDE VASC ACCESS LEFT  04/30/2017  . IRRIGATION AND DEBRIDEMENT KNEE Right 04/12/2016   Procedure: IRRIGATION AND DEBRIDEMENT KNEE;  Surgeon: Nicholes Stairs, MD;  Location: [facility];  Service: Orthopedics;  Laterality: Right;  . IRRIGATION AND DEBRIDEMENT  KNEE Right 12/16/2016   Procedure: Repeat irrigation and debridement right knee, wound closure  wound vac REPLACEMENT ANTIBIOTIC SPACERS;  Surgeon: Paralee Cancel, MD;  Location: [facility];  Service: Orthopedics;  Laterality: Right;  . KNEE ARTHROSCOPY  02/13/2012   Procedure: ARTHROSCOPY KNEE;  Surgeon: Johnn Hai, MD;  Location: [facility];  Service: Orthopedics;  Laterality: Left;  WITH DEBRIDEMENt   . KNEE ARTHROSCOPY WITH LATERAL MENISECTOMY  02/13/2012   Procedure: KNEE ARTHROSCOPY WITH LATERAL MENISECTOMY;  Surgeon: Johnn Hai, MD;  Location: [facility];  Service: Orthopedics;;  partial  . LEFT MODIFIED RADICAL MASTECTOMY/ RIGHT TOTAL MASTECTOMY  11-13-2007   LEFT BREAST CANCER W/ AXILLARY LYMPH NODE METASTASIS AND POST NEOADJUVANT CHEMO  . MASTECTOMY     Bilateral  . PLACEMENT PORT-A-CATH  06/22/2007   right chest new port placed 2015, right chest  . REIMPLANTATION OF TOTAL KNEE Right 04/27/2017   Procedure: Reimplantation of right total knee arthroplasty;  Surgeon: Paralee Cancel, MD;  Location: [facility];  Service: Orthopedics;  Laterality: Right;  Adductor Block  . SKIN GRAFT     chest-post tumor removal, second occurrence of cancer" 2015 surgery for Flap 2015"  . SKIN SPLIT GRAFT Right 01/29/2017   Procedure: SKIN GRAFT SPLIT THICKNESS TO RIGHT KNEE WOUND;  Surgeon: Wallace Going, DO;  Location: [facility];  Service: Plastics;  Laterality: Right;  . TOTAL KNEE ARTHROPLASTY Left 09/23/2012   Procedure: LEFT TOTAL KNEE ARTHROPLASTY;  Surgeon: Johnn Hai, MD;  Location: [facility];  Service: Orthopedics;  Laterality: Left;  . TOTAL KNEE ARTHROPLASTY Right 03/20/2016   Procedure: RIGHT TOTAL KNEE ARTHROPLASTY;  Surgeon: Susa Day, MD;  Location: [facility];  Service: Orthopedics;  Laterality: Right;  . TRANSTHORACIC ECHOCARDIOGRAM  11-18-2011  DR BENSIMHON (ECHO EVERY 3 MONTHS)   HX CHEMO INDUCED CARDIOTOXICITY/  LVSF NORMAL/ EF 55-50%/ MILD MITRIAL VALVE  REGURG./ MILDLY INCREASED SYSTOLIC PRESSURE OF PULMONARY ARTERIES  . VASCULAR SURGERY Right    portacath chest    There were no vitals filed for this visit.  Subjective Assessment - 06/17/17 1013    Subjective  I had more pain yesterday because I went up the steps to shower on Monday.  I see the MD tomorrow to discuss hemotoma.    Currently in Pain?  Yes    Pain Score  3  with pain meds    Pain Location  Knee    Pain Orientation  Right    Pain Descriptors / Indicators  Tightness;Sore    Pain Type  Surgical pain    Pain Onset  More than a month ago    Pain Frequency  Constant    Aggravating Factors   morning hours, activity  Pain Relieving Factors  ice, rest         [facility] PT Assessment - 06/17/17 0001      Assessment   Medical Diagnosis  Rt TKA      Prior Function   Level of Independence  Independent with basic ADLs      Cognition   Overall Cognitive Status  Within Functional Limits for tasks assessed      Observation/Other Assessments   Observations  pt ambulating without AD, significant swelling Rt knee/calf/ankle    Focus on Therapeutic Outcomes (FOTO)   49% limitation      AROM   Right Knee Extension  0    Right Knee Flexion  96      Strength   Right Knee Flexion  4+/5    Right Knee Extension  4/5                  [facility] Adult PT Treatment/Exercise - 06/17/17 0001      Knee/Hip Exercises: Aerobic   Nustep  L1-2 x 10 minutes PT present for status      Knee/Hip Exercises: Standing   Knee Flexion  AROM;Strengthening;Both;2 sets;10 reps 2#    Forward Step Up  Right;2 sets;10 reps;Hand Hold: 2;Step Height: 6" UE support was really light    Rocker Board  3 minutes    Rebounder  weight shifting 1 min 3 ways      Knee/Hip Exercises: Seated   Long Arc Quad  Strengthening;Right;3 sets;10 reps;Weights;Left 2    Sit to [employer]  2 sets;10 reps;without UE support Seated on black pad      Knee/Hip Exercises: Supine   Short Arc Quad Sets   Strengthening;Right;1 set;10 reps 2# added    Heel Slides  AROM;Right;20 reps    Straight Leg Raises  Strengthening;Right;3 sets;5 reps      Cryotherapy   Number Minutes Cryotherapy  15 Minutes    Cryotherapy Location  Knee    Type of Cryotherapy  Ice pack               PT Short Term Goals - 06/17/17 1014      PT SHORT TERM GOAL #2   Title  Pt will be able to complete atleast 20 repetitions of SLR without extension lag, which will improve her safety with ambulation and other activity.     Baseline  quad lag with 10 reps    Time  3    Period  Weeks    Status  On-going      PT SHORT TERM GOAL #4   Title  Pt will report atleast 25% reduction in pain for improved activity completion and standing tolerance at home.     Status  Achieved      PT SHORT TERM GOAL #5   Title  Pt will demo proper mechanics with sit to stand, without significant weight shift Lt and no more than 1 UE support for safety.     Status  Achieved        PT Long Term Goals - 06/17/17 1016      PT LONG TERM GOAL #3   Title  Pt will ambulate without antalgic pattern, noting symmetrical step lengths, proper push off and arm swing, to improve her efficiency with daily activity.     Baseline  antalgic gait, reduced heel strike and knee extension    Time  8    Period  Weeks    Status  On-going  PT LONG TERM GOAL #4   Title  Pt will report improved endurance and activity tolerance evident by her ability to prepare a meal at home without the need for a rest break.     Baseline  limited to 15 minutes    Time  8    Period  Weeks    Status  On-going      PT LONG TERM GOAL #6   Title  Pt will ascend and descend 6" steps with reciprocal pattern and no more than 1 handrail assistance x3 trials, to allow her to safely get to her bedroom upstairs.     Baseline  step to for safety    Time  8    Period  Weeks    Status  On-going            Plan - 06/17/17 1019    Clinical Impression Statement  Pt  with continued hematoma in Rt knee that is painful and limiting at this time. Pt is limited to standing 15-20 minutes for home tasks and limited to walking 20 minutes in the community.  Pt with quad control deficits and demonstrates quad lag with SLR and difficulty with quad control on steps.  Pt was able to increase to 6" step-ups this week and demonstrates fatigue after 10 repetitions.  Pt with limited Rt knee A/ROM due to edema.  Pt will continue to benefit from skilled PT for comprehensive strength, knee stability, A/ROM and edema/pain management      Rehab Potential  Good    PT Frequency  3x / week    PT Duration  8 weeks    PT Treatment/Interventions  ADLs/Self Care Home Management;Cryotherapy;Electrical Stimulation;Moist Heat;Balance training;Therapeutic exercise;Therapeutic activities;Functional mobility training;Stair training;Gait training;Neuromuscular re-education;Patient/family education;Scar mobilization;Manual techniques;Passive range of motion;Dry needling;Taping    PT Next Visit Plan  Request vaso for edema after pt sees MD for hematoma assessment, knee strength, hip strength, MD note- do FOTO and measurements       Patient will benefit from skilled therapeutic intervention in order to improve the following deficits and impairments:  Abnormal gait, Decreased activity tolerance, Decreased balance, Difficulty walking, Increased edema, Impaired flexibility, Hypomobility, Decreased strength, Decreased range of motion, Decreased endurance, Decreased mobility, Increased muscle spasms, Impaired tone, Pain, Improper body mechanics, Increased fascial restricitons, Decreased scar mobility  Visit Diagnosis: Acute pain of right knee  Stiffness of right knee, not elsewhere classified  Muscle weakness (generalized)  Other abnormalities of gait and mobility  Localized edema     Problem List Patient Active Problem List   Diagnosis Date Noted  . Fever   . Anemia due to chemotherapy  05/07/2017  . Abnormal LFTs 05/07/2017  . History of breast cancer   . Bilateral leg edema   . Enterococcal infection   . S/P revision of total knee, right 04/27/2017  . Pyogenic arthritis of right knee joint ([place])   . Acute blood loss anemia 12/12/2016  . Right knee abx spacer 12/02/2016  . Physical exam 07/31/2016  . Allergic reaction 07/24/2016  . Drug rash 07/24/2016  . S/P total knee arthroplasty, right 05/31/2016  . 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 Nonhealing surgical wound 05/28/2016  . Sepsis ([place]) 05/10/2016  . UTI (urinary tract infection) 05/10/2016  . Cellulitis of right knee 05/10/2016  . Blood clot in vein   . Wound dehiscence, surgical 04/12/2016  . 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 Vitamin D deficiency 01/25/2016  . Metastatic cancer to chest wall ([place]) 02/02/2015  .  Primary osteoarthritis of right knee 02/01/2015  . Symptomatic anemia 02/01/2015  . Frequent UTI 01/12/2014  . Sinus tachycardia 11/03/2013  . SOB (shortness of breath) 11/03/2013  . Postoperative anemia due to acute blood loss 12/24/2012  .    09/23/2012  . Sinusitis 07/28/2012  . Neuropathy due to chemotherapeutic drug ([place]) 07/25/2011  . 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 Metastatic breast cancer ([place]) 11/12/2010  . Chronic anticoagulation 11/12/2010  . Anxiety, generalized 03/26/2008     Sigurd Sos, PT 06/17/17 10:49 AM  [facility] [address] [place], [place], [address] Phone: [phone]   Fax:  [phone]  Name: [name] MRN: [id number] Date of Birth: 23-Jan-1956

## 2017-06-19 ENCOUNTER — Ambulatory Visit: Payer: 59 | Admitting: Physical Therapy

## 2017-06-19 ENCOUNTER — Encounter: Payer: Self-pay | Admitting: Physical Therapy

## 2017-06-19 DIAGNOSIS — M25661 Stiffness of right knee, not elsewhere classified: Secondary | ICD-10-CM

## 2017-06-19 DIAGNOSIS — R600 Localized edema: Secondary | ICD-10-CM | POA: Diagnosis not present

## 2017-06-19 DIAGNOSIS — M6281 Muscle weakness (generalized): Secondary | ICD-10-CM

## 2017-06-19 DIAGNOSIS — R2689 Other abnormalities of gait and mobility: Secondary | ICD-10-CM | POA: Diagnosis not present

## 2017-06-19 DIAGNOSIS — M25561 Pain in right knee: Secondary | ICD-10-CM | POA: Diagnosis not present

## 2017-06-19 MED FILL — GABAPENTIN 300 MG CAPSULE: 300 | 30 days supply | Qty: 150 | Fill #0

## 2017-06-19 NOTE — Therapy (Signed)
[facility] [address], [place], [place], [address] Phone: [phone]   Fax:  [phone]  Physical Therapy Treatment  Patient Details  Name: [name] MRN: [id number] Date of Birth: 1955-10-05 Referring Provider: Paralee Cancel, MD    Encounter Date: 06/19/2017  PT End of Session - 06/19/17 1011    Visit Number  8    Number of Visits  12    Date for PT Re-Evaluation  07/09/17    Authorization Type  [employer] Employee/[employer].  Secondary insurance requires authorization -12 visits approved 05/28/17-11/25/17    Authorization Time Period  05/28/17 to 07/23/17    Authorization - Visit Number  8    Authorization - Number of Visits  12    PT Start Time  1010    PT Stop Time  1100    PT Time Calculation (min)  50 min    Activity Tolerance  Patient tolerated treatment well;No increased pain    Behavior During Therapy  WFL for tasks assessed/performed       Past Medical History:  Diagnosis Date  . 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 Allergic reaction 07/24/2016  . Anemia    needed transfusion spring of 2018  . Arthritis KNEES   right knee, hx. past left knee replacemnt.  . Blood clot in vein    around Portacath right chest-tx. Eliquis about a yr., prior Lovenox.  . Blood dyscrasia   . Chronic anticoagulation HX  PORT-A-CATH CLOT   2010 - HAS BEEN ON LOVENOX SINCE THE CLOT  . Drug rash 07/24/2016  . Elevated blood pressure reading without diagnosis of hypertension    PT MONITORS HER B/P AT HOME  . Heart murmur   . 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 PONV (postoperative nausea and vomiting)    ponv likes scopolamine patch  . Port-A-Cath in place    RIGHT CHEST   . Skin cancer of anterior chest BREAST CANCER PRIMARY W/ METS TO CHEST WALL SKIN CANCER--  CHEMOTHERAPY EVERY 3 WEEKS AT [facility]   left 9 yrs ago, chemo every 3 weeks at [place], last chemo july 3rd  . Skin changes related to chemotherapy    per patient she has scabbed over skin circumventing port to r ight upper chest ; denie drainage and state it is due to chemptherapy   . Status post skin flap graft    right chest wall with metastatis right anterior chest -no drainage or open wound.    Past Surgical History:  Procedure Laterality Date  . APPLICATION OF A-CELL OF BACK Right 07/31/2016   Procedure: CELLERATE COLLAGEN PLACEMENT;  Surgeon: Loel Lofty Dillingham, DO;  Location: [place];  Service: Plastics;  Laterality: Right;  . CHEST WALL TUMOR EXCISION     right  . EXCISIONAL TOTAL KNEE ARTHROPLASTY WITH ANTIBIOTIC SPACERS Right 12/02/2016   Procedure: EXCISIONAL TOTAL KNEE ARTHROPLASTY WITH ANTIBIOTIC SPACERS;  Surgeon: Paralee Cancel, MD;  [facility];  Service: Orthopedics;  Laterality: Right;  90 mins  . hemotoma     evacuation left chest wall  . I&D KNEE WITH POLY EXCHANGE Right 05/28/2016   Procedure: IRRIGATION AND DEBRIDEMENT KNEE WOUND VAC PLACMENT;  Surgeon: Susa Day, MD;  [facility];  Service: Orthopedics;  Laterality: Right;  . I&D KNEE WITH POLY  EXCHANGE Right 05/30/2016   Procedure: RADICAL SYNOVECTOMY,IRRIGATION AND DEBRIDEMENT KNEE WITH POLY EXCHANGE WITH ANTIBIOTIC BEADS, APPLICATION OF WOUND VAC;  Surgeon: Susa Day, MD;  [facility];  Service: Orthopedics;  Laterality: Right;  . INCISION AND DRAINAGE OF WOUND Right 07/31/2016   Procedure: IRRIGATION AND DEBRIDEMENT RIGHT KNEE  WOUND;  Surgeon: Loel Lofty Dillingham, DO;  Location: [place];  Service: Plastics;  Laterality: Right;  . IR FLUORO GUIDE CV LINE LEFT  04/30/2017  . IR US GUIDE VASC ACCESS LEFT  04/30/2017  . IRRIGATION AND DEBRIDEMENT KNEE Right 04/12/2016   Procedure: IRRIGATION AND DEBRIDEMENT KNEE;  Surgeon: Nicholes Stairs, MD;  [facility];  Service: Orthopedics;  Laterality: Right;  . IRRIGATION AND DEBRIDEMENT  KNEE Right 12/16/2016   Procedure: Repeat irrigation and debridement right knee, wound closure  wound vac REPLACEMENT ANTIBIOTIC SPACERS;  Surgeon: Paralee Cancel, MD;  [facility];  Service: Orthopedics;  Laterality: Right;  . KNEE ARTHROSCOPY  02/13/2012   Procedure: ARTHROSCOPY KNEE;  Surgeon: Johnn Hai, MD;  Location: [facility];  Service: Orthopedics;  Laterality: Left;  WITH DEBRIDEMENt   . KNEE ARTHROSCOPY WITH LATERAL MENISECTOMY  02/13/2012   Procedure: KNEE ARTHROSCOPY WITH LATERAL MENISECTOMY;  Surgeon: Johnn Hai, MD;  Location: [place];  Service: Orthopedics;;  partial  . LEFT MODIFIED RADICAL MASTECTOMY/ RIGHT TOTAL MASTECTOMY  11-13-2007   LEFT BREAST CANCER W/ AXILLARY LYMPH NODE METASTASIS AND POST NEOADJUVANT CHEMO  . MASTECTOMY     Bilateral  . PLACEMENT PORT-A-CATH  06/22/2007   right chest new port placed 2015, right chest  . REIMPLANTATION OF TOTAL KNEE Right 04/27/2017   Procedure: Reimplantation of right total knee arthroplasty;  Surgeon: Paralee Cancel, MD;  [facility];  Service: Orthopedics;  Laterality: Right;  Adductor Block  . SKIN GRAFT     chest-post tumor removal, second occurrence of cancer" 2015 surgery for Flap 2015"  . SKIN SPLIT GRAFT Right 01/29/2017   Procedure: SKIN GRAFT SPLIT THICKNESS TO RIGHT KNEE WOUND;  Surgeon: Wallace Going, DO;  [facility];  Service: Plastics;  Laterality: Right;  . TOTAL KNEE ARTHROPLASTY Left 09/23/2012   Procedure: LEFT TOTAL KNEE ARTHROPLASTY;  Surgeon: Johnn Hai, MD;  [facility];  Service: Orthopedics;  Laterality: Left;  . TOTAL KNEE ARTHROPLASTY Right 03/20/2016   Procedure: RIGHT TOTAL KNEE ARTHROPLASTY;  Surgeon: Susa Day, MD;  [facility];  Service: Orthopedics;  Laterality: Right;  . TRANSTHORACIC ECHOCARDIOGRAM  11-18-2011  DR BENSIMHON (ECHO EVERY 3 MONTHS)   HX CHEMO INDUCED CARDIOTOXICITY/  LVSF NORMAL/ EF 55-50%/ MILD MITRIAL VALVE  REGURG./ MILDLY INCREASED SYSTOLIC PRESSURE OF PULMONARY ARTERIES  . VASCULAR SURGERY Right    portacath chest    There were no vitals filed for this visit.  Subjective Assessment - 06/19/17 1012    Subjective  MD saw knee, they are just going to watch it. No comment on Vasopneumatic.     Pertinent History  Breast cancer; chemo every 3 weeks     Limitations  Walking;House hold activities    Currently in Pain?  No/denies    Multiple Pain Sites  No                      [facility] Adult PT Treatment/Exercise - 06/19/17 0001      Knee/Hip Exercises: Aerobic   Nustep  L2 x 10 min PTA present to monitor  Knee/Hip Exercises: Standing   Knee Flexion  AROM;Strengthening;Both;2 sets;10 reps 2#    Forward Step Up  Right;2 sets;15 reps;Hand Hold: 2;Step Height: 6" UE support was really light    Rebounder  weight shifting 1 min 3 ways    Walking with Sports Cord  20# 10x forward & back close CGA      Knee/Hip Exercises: Seated   Long Arc Quad  Strengthening;Right;3 sets;10 reps;Weights    Long Arc Quad Weight  3 lbs.    Sit to Sand  2 sets;10 reps;without UE support Seated on black pad      Cryotherapy   Number Minutes Cryotherapy  15 Minutes    Cryotherapy Location  Knee    Type of Cryotherapy  Ice pack               PT Short Term Goals - 06/17/17 1014      PT SHORT TERM GOAL #2   Title  Pt will be able to complete atleast 20 repetitions of SLR without extension lag, which will improve her safety with ambulation and other activity.     Baseline  quad lag with 10 reps    Time  3    Period  Weeks    Status  On-going      PT SHORT TERM GOAL #4   Title  Pt will report atleast 25% reduction in pain for improved activity completion and standing tolerance at home.     Status  Achieved      PT SHORT TERM GOAL #5   Title  Pt will demo proper mechanics with sit to stand, without significant weight shift Lt and no more than 1 UE support for safety.     Status   Achieved        PT Long Term Goals - 06/17/17 1016      PT LONG TERM GOAL #3   Title  Pt will ambulate without antalgic pattern, noting symmetrical step lengths, proper push off and arm swing, to improve her efficiency with daily activity.     Baseline  antalgic gait, reduced heel strike and knee extension    Time  8    Period  Weeks    Status  On-going      PT LONG TERM GOAL #4   Title  Pt will report improved endurance and activity tolerance evident by her ability to prepare a meal at home without the need for a rest break.     Baseline  limited to 15 minutes    Time  8    Period  Weeks    Status  On-going      PT LONG TERM GOAL #6   Title  Pt will ascend and descend 6" steps with reciprocal pattern and no more than 1 handrail assistance x3 trials, to allow her to safely get to her bedroom upstairs.     Baseline  step to for safety    Time  8    Period  Weeks    Status  On-going            Plan - 06/19/17 1011    Clinical Impression Statement  MD is going watch the hematoma, no other comments about it. Increased some weights today to continue working on increasing her knee strentgh.    Rehab Potential  Good    PT Frequency  3x / week    PT Duration  8 weeks    PT Treatment/Interventions  ADLs/Self Care Home  Management;Cryotherapy;Electrical Stimulation;Moist Heat;Balance training;Therapeutic exercise;Therapeutic activities;Functional mobility training;Stair training;Gait training;Neuromuscular re-education;Patient/family education;Scar mobilization;Manual techniques;Passive range of motion;Dry needling;Taping    PT Next Visit Plan  Continue with knee strength    Consulted and Agree with Plan of Care  Patient       Patient will benefit from skilled therapeutic intervention in order to improve the following deficits and impairments:  Abnormal gait, Decreased activity tolerance, Decreased balance, Difficulty walking, Increased edema, Impaired flexibility, Hypomobility,  Decreased strength, Decreased range of motion, Decreased endurance, Decreased mobility, Increased muscle spasms, Impaired tone, Pain, Improper body mechanics, Increased fascial restricitons, Decreased scar mobility  Visit Diagnosis: Acute pain of right knee  Stiffness of right knee, not elsewhere classified  Muscle weakness (generalized)  Other abnormalities of gait and mobility  Localized edema     Problem List Patient Active Problem List   Diagnosis Date Noted  . Fever   . Anemia due to chemotherapy 05/07/2017  . Abnormal LFTs 05/07/2017  . History of breast cancer   . Bilateral leg edema   . Enterococcal infection   . S/P revision of total knee, right 04/27/2017  . Pyogenic arthritis of right knee joint ([place])   . Acute blood loss anemia 12/12/2016  . Right knee abx spacer 12/02/2016  . Physical exam 07/31/2016  . Allergic reaction 07/24/2016  . Drug rash 07/24/2016  . S/P total knee arthroplasty, right 05/31/2016  . 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 Nonhealing surgical wound 05/28/2016  . Sepsis ([place]) 05/10/2016  . UTI (urinary tract infection) 05/10/2016  . Cellulitis of right knee 05/10/2016  . Blood clot in vein   . Wound dehiscence, surgical 04/12/2016  . 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 Vitamin D deficiency 01/25/2016  . Metastatic cancer to chest wall ([place]) 02/02/2015  . Primary osteoarthritis of right knee 02/01/2015  . Symptomatic anemia 02/01/2015  . Frequent UTI 01/12/2014  . Sinus tachycardia 11/03/2013  . SOB (shortness of breath) 11/03/2013  . Postoperative anemia due to acute blood loss 12/24/2012  .    09/23/2012  . Sinusitis 07/28/2012  . Neuropathy due to chemotherapeutic drug ([place]) 07/25/2011  . 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 Metastatic breast cancer ([place]) 11/12/2010  . Chronic anticoagulation 11/12/2010  . Anxiety, generalized 03/26/2008    Harutyun Monteverde, PTA 06/19/2017, 10:38 AM  [facility] [address], [place], [place], [address] Phone: [phone]   Fax:  [phone]  Name: [name] MRN: [id number] Date of Birth: [birth year]

## 2017-06-22 ENCOUNTER — Ambulatory Visit: Payer: 59

## 2017-06-22 DIAGNOSIS — M6281 Muscle weakness (generalized): Secondary | ICD-10-CM

## 2017-06-22 DIAGNOSIS — R2689 Other abnormalities of gait and mobility: Secondary | ICD-10-CM

## 2017-06-22 DIAGNOSIS — M25661 Stiffness of right knee, not elsewhere classified: Secondary | ICD-10-CM

## 2017-06-22 DIAGNOSIS — Z471 Aftercare following joint replacement surgery: Secondary | ICD-10-CM | POA: Diagnosis not present

## 2017-06-22 DIAGNOSIS — R600 Localized edema: Secondary | ICD-10-CM | POA: Diagnosis not present

## 2017-06-22 DIAGNOSIS — Z96651 Presence of right artificial knee joint: Secondary | ICD-10-CM | POA: Diagnosis not present

## 2017-06-22 DIAGNOSIS — M25561 Pain in right knee: Secondary | ICD-10-CM

## 2017-06-22 NOTE — Therapy (Addendum)
[facility] [address], [place], [place], [address] Phone: [phone]   Fax:  [phone]  Physical Therapy Treatment  Patient Details  Name: [name] MRN: [id number] Date of Birth: 1956-01-07 Referring Provider: Paralee Cancel, MD    Encounter Date: 06/22/2017  PT End of Session - 06/22/17 1057    Visit Number  9    Number of Visits  12    Date for PT Re-Evaluation  07/09/17    Authorization Type  [employer] Employee/[employer].  Secondary insurance requires authorization -12 visits approved 05/28/17-11/25/17    Authorization Time Period  05/28/17 to 07/23/17    Authorization - Visit Number  9    Authorization - Number of Visits  12    PT Start Time  1010    PT Stop Time  1115    PT Time Calculation (min)  65 min    Activity Tolerance  Patient tolerated treatment well;No increased pain    Behavior During Therapy  WFL for tasks assessed/performed       Past Medical History:  Diagnosis Date  . 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 Allergic reaction 07/24/2016  . Anemia    needed transfusion spring of 2018  . Arthritis KNEES   right knee, hx. past left knee replacemnt.  . Blood clot in vein    around Portacath right chest-tx. Eliquis about a yr., prior Lovenox.  . Blood dyscrasia   . Chronic anticoagulation HX  PORT-A-CATH CLOT   2010 - HAS BEEN ON LOVENOX SINCE THE CLOT  . Drug rash 07/24/2016  . Elevated blood pressure reading without diagnosis of hypertension    PT MONITORS HER B/P AT HOME  . Heart murmur   . 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 PONV (postoperative nausea and vomiting)    ponv likes scopolamine patch  . Port-A-Cath in place    RIGHT CHEST   . Skin cancer of anterior chest BREAST CANCER PRIMARY W/ METS TO CHEST WALL SKIN CANCER--  CHEMOTHERAPY EVERY 3 WEEKS AT [facility]   left 9 yrs ago, chemo every 3 weeks at [place], last chemo july 3rd  . Skin changes related to chemotherapy    per patient she has scabbed over skin circumventing port to r ight upper chest ; denie drainage and state it is due to chemptherapy   . Status post skin flap graft    right chest wall with metastatis right anterior chest -no drainage or open wound.    Past Surgical History:  Procedure Laterality Date  . APPLICATION OF A-CELL OF BACK Right 07/31/2016   Procedure: CELLERATE COLLAGEN PLACEMENT;  Surgeon: Loel Lofty Dillingham, DO;  Location: [place];  Service: Plastics;  Laterality: Right;  . CHEST WALL TUMOR EXCISION     right  . EXCISIONAL TOTAL KNEE ARTHROPLASTY WITH ANTIBIOTIC SPACERS Right 12/02/2016   Procedure: EXCISIONAL TOTAL KNEE ARTHROPLASTY WITH ANTIBIOTIC SPACERS;  Surgeon: Paralee Cancel, MD;  [facility];  Service: Orthopedics;  Laterality: Right;  90 mins  . hemotoma     evacuation left chest wall  . I&D KNEE WITH POLY EXCHANGE Right 05/28/2016   Procedure: IRRIGATION AND DEBRIDEMENT KNEE WOUND VAC PLACMENT;  Surgeon: Susa Day, MD;  [facility];  Service: Orthopedics;  Laterality: Right;  . I&D KNEE WITH POLY  EXCHANGE Right 05/30/2016   Procedure: RADICAL SYNOVECTOMY,IRRIGATION AND DEBRIDEMENT KNEE WITH POLY EXCHANGE WITH ANTIBIOTIC BEADS, APPLICATION OF WOUND VAC;  Surgeon: Susa Day, MD;  [facility];  Service: Orthopedics;  Laterality: Right;  . INCISION AND DRAINAGE OF WOUND Right 07/31/2016   Procedure: IRRIGATION AND DEBRIDEMENT RIGHT KNEE  WOUND;  Surgeon: Loel Lofty Dillingham, DO;  Location: [place];  Service: Plastics;  Laterality: Right;  . IR FLUORO GUIDE CV LINE LEFT  04/30/2017  . IR US GUIDE VASC ACCESS LEFT  04/30/2017  . IRRIGATION AND DEBRIDEMENT KNEE Right 04/12/2016   Procedure: IRRIGATION AND DEBRIDEMENT KNEE;  Surgeon: Nicholes Stairs, MD;  [facility];  Service: Orthopedics;  Laterality: Right;  . IRRIGATION AND DEBRIDEMENT  KNEE Right 12/16/2016   Procedure: Repeat irrigation and debridement right knee, wound closure  wound vac REPLACEMENT ANTIBIOTIC SPACERS;  Surgeon: Paralee Cancel, MD;  [facility];  Service: Orthopedics;  Laterality: Right;  . KNEE ARTHROSCOPY  02/13/2012   Procedure: ARTHROSCOPY KNEE;  Surgeon: Johnn Hai, MD;  Location: [facility];  Service: Orthopedics;  Laterality: Left;  WITH DEBRIDEMENt   . KNEE ARTHROSCOPY WITH LATERAL MENISECTOMY  02/13/2012   Procedure: KNEE ARTHROSCOPY WITH LATERAL MENISECTOMY;  Surgeon: Johnn Hai, MD;  Location: [place];  Service: Orthopedics;;  partial  . LEFT MODIFIED RADICAL MASTECTOMY/ RIGHT TOTAL MASTECTOMY  11-13-2007   LEFT BREAST CANCER W/ AXILLARY LYMPH NODE METASTASIS AND POST NEOADJUVANT CHEMO  . MASTECTOMY     Bilateral  . PLACEMENT PORT-A-CATH  06/22/2007   right chest new port placed 2015, right chest  . REIMPLANTATION OF TOTAL KNEE Right 04/27/2017   Procedure: Reimplantation of right total knee arthroplasty;  Surgeon: Paralee Cancel, MD;  [facility];  Service: Orthopedics;  Laterality: Right;  Adductor Block  . SKIN GRAFT     chest-post tumor removal, second occurrence of cancer" 2015 surgery for Flap 2015"  . SKIN SPLIT GRAFT Right 01/29/2017   Procedure: SKIN GRAFT SPLIT THICKNESS TO RIGHT KNEE WOUND;  Surgeon: Wallace Going, DO;  [facility];  Service: Plastics;  Laterality: Right;  . TOTAL KNEE ARTHROPLASTY Left 09/23/2012   Procedure: LEFT TOTAL KNEE ARTHROPLASTY;  Surgeon: Johnn Hai, MD;  [facility];  Service: Orthopedics;  Laterality: Left;  . TOTAL KNEE ARTHROPLASTY Right 03/20/2016   Procedure: RIGHT TOTAL KNEE ARTHROPLASTY;  Surgeon: Susa Day, MD;  [facility];  Service: Orthopedics;  Laterality: Right;  . TRANSTHORACIC ECHOCARDIOGRAM  11-18-2011  DR BENSIMHON (ECHO EVERY 3 MONTHS)   HX CHEMO INDUCED CARDIOTOXICITY/  LVSF NORMAL/ EF 55-50%/ MILD MITRIAL VALVE  REGURG./ MILDLY INCREASED SYSTOLIC PRESSURE OF PULMONARY ARTERIES  . VASCULAR SURGERY Right    portacath chest    There were no vitals filed for this visit.  Subjective Assessment - 06/22/17 1021    Subjective  I had drainage from the hematoma in the shower this weekend.      Currently in Pain?  No/denies                      [facility] Adult PT Treatment/Exercise - 06/22/17 0001      Knee/Hip Exercises: Aerobic   Nustep  L2 x 15 min PT present to monitor for 1/2 of this time      Knee/Hip Exercises: Standing   Knee Flexion  AROM;Strengthening;Both;2 sets;10 reps 2#    Forward Step Up  Right;2 sets;15 reps;Hand Hold: 2;Step Height: 6"  UE support was really light    Rebounder  weight shifting 1 min 3 ways      Knee/Hip Exercises: Seated   Long Arc Quad  Strengthening;Right;3 sets;10 reps;Weights    Long Arc Quad Weight  3 lbs.    Sit to Sand  2 sets;10 reps;without UE support Seated on black pad      Knee/Hip Exercises: Supine   Short Arc Quad Sets  Strengthening;Right;1 set;10 reps 3# added    Straight Leg Raises  Strengthening;Right;3 sets;5 reps      Cryotherapy   Number Minutes Cryotherapy  15 Minutes    Cryotherapy Location  Knee    Type of Cryotherapy  Ice pack               PT Short Term Goals - 06/22/17 1024      PT SHORT TERM GOAL #2   Title  Pt will be able to complete atleast 20 repetitions of SLR without extension lag, which will improve her safety with ambulation and other activity.     Baseline  quad lag with 10 reps    Time  3    Period  Weeks    Status  On-going      PT SHORT TERM GOAL #3   Title  Pt will demo Lt knee AROM flexion to atlast 100 deg which will help with technique during sit to stand.     Time  3    Period  Weeks    Status  On-going        PT Long Term Goals - 06/17/17 1016      PT LONG TERM GOAL #3   Title  Pt will ambulate without antalgic pattern, noting symmetrical step lengths, proper push off and arm  swing, to improve her efficiency with daily activity.     Baseline  antalgic gait, reduced heel strike and knee extension    Time  8    Period  Weeks    Status  On-going      PT LONG TERM GOAL #4   Title  Pt will report improved endurance and activity tolerance evident by her ability to prepare a meal at home without the need for a rest break.     Baseline  limited to 15 minutes    Time  8    Period  Weeks    Status  On-going      PT LONG TERM GOAL #6   Title  Pt will ascend and descend 6" steps with reciprocal pattern and no more than 1 handrail assistance x3 trials, to allow her to safely get to her bedroom upstairs.     Baseline  step to for safety    Time  8    Period  Weeks    Status  On-going            Plan - 06/22/17 1024    Clinical Impression Statement  Pt continues to have slow progress due to large hemotoma at Rt knee.  MD wants to watch it and not drain it.  Pt focusing on increasing her knee A/ROM and strength for stability.  Pt will continue to benefit from skilled PT for Rt knee strength, flexibility, stability and edema management.      PT Frequency  3x / week    PT Duration  8 weeks    PT Treatment/Interventions  ADLs/Self Care Home Management;Cryotherapy;Electrical Stimulation;Moist Heat;Balance training;Therapeutic exercise;Therapeutic activities;Functional mobility training;Stair training;Gait training;Neuromuscular re-education;Patient/family education;Scar mobilization;Manual techniques;Passive range  of motion;Dry needling;Taping    PT Next Visit Plan  Rt knee strength, flexibility, edema management    Consulted and Agree with Plan of Care  Patient       Patient will benefit from skilled therapeutic intervention in order to improve the following deficits and impairments:  Abnormal gait, Decreased activity tolerance, Decreased balance, Difficulty walking, Increased edema, Impaired flexibility, Hypomobility, Decreased strength, Decreased range of motion,  Decreased endurance, Decreased mobility, Increased muscle spasms, Impaired tone, Pain, Improper body mechanics, Increased fascial restricitons, Decreased scar mobility  Visit Diagnosis: Acute pain of right knee  Stiffness of right knee, not elsewhere classified  Muscle weakness (generalized)  Other abnormalities of gait and mobility  Localized edema     Problem List Patient Active Problem List   Diagnosis Date Noted  . Fever   . Anemia due to chemotherapy 05/07/2017  . Abnormal LFTs 05/07/2017  . History of breast cancer   . Bilateral leg edema   . Enterococcal infection   . S/P revision of total knee, right 04/27/2017  . Pyogenic arthritis of right knee joint ([place])   . Acute blood loss anemia 12/12/2016  . Right knee abx spacer 12/02/2016  . Physical exam 07/31/2016  . Allergic reaction 07/24/2016  . Drug rash 07/24/2016  . S/P total knee arthroplasty, right 05/31/2016  . 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 Nonhealing surgical wound 05/28/2016  . Sepsis ([place]) 05/10/2016  . UTI (urinary tract infection) 05/10/2016  . Cellulitis of right knee 05/10/2016  . Blood clot in vein   . Wound dehiscence, surgical 04/12/2016  . 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 Vitamin D deficiency 01/25/2016  . Metastatic cancer to chest wall ([place]) 02/02/2015  . Primary osteoarthritis of right knee 02/01/2015  . Symptomatic anemia 02/01/2015  . Frequent UTI 01/12/2014  . Sinus tachycardia 11/03/2013  . SOB (shortness of breath) 11/03/2013  . Postoperative anemia due to acute blood loss 12/24/2012  .    09/23/2012  . Sinusitis 07/28/2012  . Neuropathy due to chemotherapeutic drug ([place]) 07/25/2011  . 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 Metastatic breast cancer ([place]) 11/12/2010  . Chronic anticoagulation 11/12/2010  . Anxiety, generalized 03/26/2008     Sigurd Sos, PT 06/22/17 11:01 AM PHYSICAL THERAPY DISCHARGE SUMMARY  Visits from Start of Care: 9  Current  functional level related to goals / functional outcomes: Pt called to cancel all remaining appointments due to complications with surgical incision.     Remaining deficits: See above for current status.     Education / Equipment: HEP  Plan: Patient agrees to discharge.  Patient goals were not met. Patient is being discharged due to a change in medical status.  ?????         Sigurd Sos, PT 07/09/17 9:37 AM  [facility] [address] [place], [place], [address] Phone: [phone]   Fax:  [phone]  Name: RETAJ HILBUN MRN: [id number] Date of Birth: 07-21-1955

## 2017-06-23 DIAGNOSIS — T8453XD Infection and inflammatory reaction due to internal right knee prosthesis, subsequent encounter: Secondary | ICD-10-CM | POA: Diagnosis not present

## 2017-06-23 DIAGNOSIS — C50911 Malignant neoplasm of unspecified site of right female breast: Secondary | ICD-10-CM | POA: Diagnosis not present

## 2017-06-23 DIAGNOSIS — C792 Secondary malignant neoplasm of skin: Secondary | ICD-10-CM | POA: Diagnosis not present

## 2017-06-23 DIAGNOSIS — T8131XD Disruption of external operation (surgical) wound, not elsewhere classified, subsequent encounter: Secondary | ICD-10-CM | POA: Diagnosis not present

## 2017-06-24 ENCOUNTER — Ambulatory Visit: Payer: Self-pay | Admitting: Plastic Surgery

## 2017-06-24 DIAGNOSIS — C792 Secondary malignant neoplasm of skin: Secondary | ICD-10-CM

## 2017-06-24 DIAGNOSIS — C50911 Malignant neoplasm of unspecified site of right female breast: Secondary | ICD-10-CM

## 2017-06-24 DIAGNOSIS — S91001A Unspecified open wound, right ankle, initial encounter: Principal | ICD-10-CM

## 2017-06-24 DIAGNOSIS — S81001A Unspecified open wound, right knee, initial encounter: Secondary | ICD-10-CM

## 2017-06-24 DIAGNOSIS — S81801A Unspecified open wound, right lower leg, initial encounter: Principal | ICD-10-CM

## 2017-06-25 NOTE — Patient Instructions (Signed)
[name]  06/25/2017   Your procedure is scheduled on: 06-29-17   Report to [facility] Main  Entrance    Report to admitting at 7:30AM   Call this number if you have problems the morning of surgery [phone]     Remember: Do not eat food or drink liquids :After Midnight.     Take these medicines the morning of surgery with A SIP OF WATER: NONE                                 You may not have any metal on your body including hair pins and              piercings  Do not wear jewelry, make-up, lotions, powders or perfumes, deodorant             Do not wear nail polish.  Do not shave  48 hours prior to surgery.     Do not bring valuables to the hospital. [place].  Contacts, dentures or bridgework may not be worn into surgery.     Patients discharged the day of surgery will not be allowed to drive home.  Name and phone number of your driver:  Special Instructions: N/A              Please read over the following fact sheets you were given: _____________________________________________________________________             [facility] - Preparing for Surgery Before surgery, you can play an important role.  Because skin is not sterile, your skin needs to be as free of germs as possible.  You can reduce the number of germs on your skin by washing with CHG (chlorahexidine gluconate) soap before surgery.  CHG is an antiseptic cleaner which kills germs and bonds with the skin to continue killing germs even after washing. Please DO NOT use if you have an allergy to CHG or antibacterial soaps.  If your skin becomes reddened/irritated stop using the CHG and inform your nurse when you arrive at Short Stay. Do not shave (including legs and underarms) for at least 48 hours prior to the first CHG shower.  You may shave your face/neck. Please follow these instructions carefully:  1.  Shower with CHG Soap the night  before surgery and the  morning of Surgery.  2.  If you choose to wash your hair, wash your hair first as usual with your  normal  shampoo.  3.  After you shampoo, rinse your hair and body thoroughly to remove the  shampoo.                           4.  Use CHG as you would any other liquid soap.  You can apply chg directly  to the skin and wash                       Gently with a scrungie or clean washcloth.  5.  Apply the CHG Soap to your body ONLY FROM THE NECK DOWN.   Do not use on face/ open  Wound or open sores. Avoid contact with eyes, ears mouth and genitals (private parts).                       Wash face,  Genitals (private parts) with your normal soap.             6.  Wash thoroughly, paying special attention to the area where your surgery  will be performed.  7.  Thoroughly rinse your body with warm water from the neck down.  8.  DO NOT shower/wash with your normal soap after using and rinsing off  the CHG Soap.                9.  Pat yourself dry with a clean towel.            10.  Wear clean pajamas.            11.  Place clean sheets on your bed the night of your first shower and do not  sleep with pets. Day of Surgery : Do not apply any lotions/deodorants the morning of surgery.  Please wear clean clothes to the hospital/surgery center.  FAILURE TO FOLLOW THESE INSTRUCTIONS MAY RESULT IN THE CANCELLATION OF YOUR SURGERY PATIENT SIGNATURE_________________________________  NURSE SIGNATURE__________________________________  ________________________________________________________________________

## 2017-06-25 NOTE — Progress Notes (Signed)
ECHO 04-24-18 Epic  CT CHEST 06-08-17 Epic CARE EVERYWHERE  EKG 05-07-17 Epic  LOV CARDIOLOGY 01-08-17 WITH Chalmers Cater PA-C/DR BENSIHMON  Epic   CMP. CBCDIFF 06-16-17 Epic CARE EVERYWHERE

## 2017-06-26 ENCOUNTER — Other Ambulatory Visit: Payer: Self-pay

## 2017-06-26 ENCOUNTER — Encounter (HOSPITAL_COMMUNITY): Payer: Self-pay

## 2017-06-26 ENCOUNTER — Encounter (HOSPITAL_COMMUNITY)
Admission: RE | Admit: 2017-06-26 | Discharge: 2017-06-26 | Disposition: A | Discharge status: Home / Self Care | Payer: 59 | Source: Ambulatory Visit | Attending: Plastic Surgery | Admitting: Plastic Surgery

## 2017-06-26 ENCOUNTER — Encounter: Payer: Self-pay | Admitting: Physical Therapy

## 2017-06-26 DIAGNOSIS — Z79899 Other long term (current) drug therapy: Secondary | ICD-10-CM | POA: Diagnosis not present

## 2017-06-26 DIAGNOSIS — Z820 Family history of epilepsy and other diseases of the nervous system: Secondary | ICD-10-CM | POA: Diagnosis not present

## 2017-06-26 DIAGNOSIS — Z8489 Family history of other specified conditions: Secondary | ICD-10-CM | POA: Diagnosis not present

## 2017-06-26 DIAGNOSIS — Z883 Allergy status to other anti-infective agents status: Secondary | ICD-10-CM | POA: Diagnosis not present

## 2017-06-26 DIAGNOSIS — D649 Anemia, unspecified: Secondary | ICD-10-CM | POA: Diagnosis not present

## 2017-06-26 DIAGNOSIS — M96840 Postprocedural hematoma of a musculoskeletal structure following a musculoskeletal system procedure: Secondary | ICD-10-CM | POA: Diagnosis not present

## 2017-06-26 DIAGNOSIS — Z86718 Personal history of other venous thrombosis and embolism: Secondary | ICD-10-CM | POA: Diagnosis not present

## 2017-06-26 DIAGNOSIS — Z8249 Family history of ischemic heart disease and other diseases of the circulatory system: Secondary | ICD-10-CM | POA: Diagnosis not present

## 2017-06-26 DIAGNOSIS — Z7901 Long term (current) use of anticoagulants: Secondary | ICD-10-CM | POA: Diagnosis not present

## 2017-06-26 DIAGNOSIS — S8001XA Contusion of right knee, initial encounter: Secondary | ICD-10-CM | POA: Diagnosis not present

## 2017-06-26 DIAGNOSIS — R011 Cardiac murmur, unspecified: Secondary | ICD-10-CM | POA: Diagnosis not present

## 2017-06-26 DIAGNOSIS — C792 Secondary malignant neoplasm of skin: Secondary | ICD-10-CM

## 2017-06-26 DIAGNOSIS — C50911 Malignant neoplasm of unspecified site of right female breast: Secondary | ICD-10-CM | POA: Diagnosis not present

## 2017-06-26 DIAGNOSIS — Z803 Family history of malignant neoplasm of breast: Secondary | ICD-10-CM | POA: Diagnosis not present

## 2017-06-26 DIAGNOSIS — S91001A Unspecified open wound, right ankle, initial encounter: Principal | ICD-10-CM

## 2017-06-26 DIAGNOSIS — Z888 Allergy status to other drugs, medicaments and biological substances status: Secondary | ICD-10-CM | POA: Diagnosis not present

## 2017-06-26 DIAGNOSIS — Z811 Family history of alcohol abuse and dependence: Secondary | ICD-10-CM | POA: Diagnosis not present

## 2017-06-26 DIAGNOSIS — S81001A Unspecified open wound, right knee, initial encounter: Secondary | ICD-10-CM

## 2017-06-26 DIAGNOSIS — Y839 Surgical procedure, unspecified as the cause of abnormal reaction of the patient, or of later complication, without mention of misadventure at the time of the procedure: Secondary | ICD-10-CM | POA: Diagnosis not present

## 2017-06-26 DIAGNOSIS — Z9221 Personal history of antineoplastic chemotherapy: Secondary | ICD-10-CM | POA: Diagnosis not present

## 2017-06-26 DIAGNOSIS — Z96653 Presence of artificial knee joint, bilateral: Secondary | ICD-10-CM | POA: Diagnosis not present

## 2017-06-26 DIAGNOSIS — S81801A Unspecified open wound, right lower leg, initial encounter: Principal | ICD-10-CM

## 2017-06-26 DIAGNOSIS — R030 Elevated blood-pressure reading, without diagnosis of hypertension: Secondary | ICD-10-CM | POA: Diagnosis not present

## 2017-06-26 DIAGNOSIS — Z880 Allergy status to penicillin: Secondary | ICD-10-CM | POA: Diagnosis not present

## 2017-06-26 LAB — BASIC METABOLIC PANEL
ANION GAP: 8 (ref 5–15)
BUN: 12 mg/dL (ref 6–20)
CHLORIDE: 101 mmol/L (ref 101–111)
CO2: 28 mmol/L (ref 22–32)
Calcium: 9.4 mg/dL (ref 8.9–10.3)
Creatinine, Ser: 0.57 mg/dL (ref 0.44–1.00)
GFR calc Af Amer: >60 mL/min (ref >60)
Glucose, Bld: 91 mg/dL (ref 65–99)
POTASSIUM: 4.1 mmol/L (ref 3.5–5.1)
SODIUM: 137 mmol/L (ref 135–145)

## 2017-06-26 LAB — CBC
HCT: 38.4 % (ref 36.0–46.0)
HEMOGLOBIN: 12.5 g/dL (ref 12.0–15.0)
MCH: 31.6 pg (ref 26.0–34.0)
MCHC: 32.6 g/dL (ref 30.0–36.0)
MCV: 97.2 fL (ref 78.0–100.0)
PLATELETS: 209 10*3/uL (ref 150–400)
RBC: 3.95 MIL/uL (ref 3.87–5.11)
RDW: 16.7 % — ABNORMAL HIGH (ref 11.5–15.5)
WBC: 5.5 10*3/uL (ref 4.0–10.5)

## 2017-06-26 NOTE — Progress Notes (Signed)
Spoke with anesthesia Dr Kalman Shan concerning EKG done on 05-07-17 noting "sinus pause". Reviewed pertinent medical history with Dr Kalman Shan including last ECHO results. Dr Kalman Shan okay with patient proceeding with surgery as scheduled.

## 2017-06-29 ENCOUNTER — Encounter (HOSPITAL_COMMUNITY): Payer: Self-pay | Admitting: Emergency Medicine

## 2017-06-29 ENCOUNTER — Ambulatory Visit (HOSPITAL_COMMUNITY): Payer: 59 | Admitting: Certified Registered Nurse Anesthetist

## 2017-06-29 ENCOUNTER — Other Ambulatory Visit: Payer: Self-pay

## 2017-06-29 ENCOUNTER — Ambulatory Visit (HOSPITAL_COMMUNITY)
Admission: RE | Admit: 2017-06-29 | Discharge: 2017-06-29 | Disposition: A | Discharge status: Home / Self Care | Payer: 59 | Source: Ambulatory Visit | Attending: Plastic Surgery | Admitting: Plastic Surgery

## 2017-06-29 ENCOUNTER — Encounter (HOSPITAL_COMMUNITY)
Admission: RE | Disposition: A | Discharge status: Home / Self Care | Payer: Self-pay | Source: Ambulatory Visit | Attending: Plastic Surgery

## 2017-06-29 DIAGNOSIS — Y839 Surgical procedure, unspecified as the cause of abnormal reaction of the patient, or of later complication, without mention of misadventure at the time of the procedure: Secondary | ICD-10-CM | POA: Insufficient documentation

## 2017-06-29 DIAGNOSIS — S8001XA Contusion of right knee, initial encounter: Secondary | ICD-10-CM | POA: Diagnosis not present

## 2017-06-29 DIAGNOSIS — S91001A Unspecified open wound, right ankle, initial encounter: Secondary | ICD-10-CM

## 2017-06-29 DIAGNOSIS — Z79899 Other long term (current) drug therapy: Secondary | ICD-10-CM | POA: Insufficient documentation

## 2017-06-29 DIAGNOSIS — Z883 Allergy status to other anti-infective agents status: Secondary | ICD-10-CM | POA: Insufficient documentation

## 2017-06-29 DIAGNOSIS — C792 Secondary malignant neoplasm of skin: Secondary | ICD-10-CM | POA: Diagnosis not present

## 2017-06-29 DIAGNOSIS — C50911 Malignant neoplasm of unspecified site of right female breast: Secondary | ICD-10-CM | POA: Diagnosis not present

## 2017-06-29 DIAGNOSIS — Z96653 Presence of artificial knee joint, bilateral: Secondary | ICD-10-CM | POA: Insufficient documentation

## 2017-06-29 DIAGNOSIS — R030 Elevated blood-pressure reading, without diagnosis of hypertension: Secondary | ICD-10-CM | POA: Diagnosis not present

## 2017-06-29 DIAGNOSIS — R011 Cardiac murmur, unspecified: Secondary | ICD-10-CM | POA: Insufficient documentation

## 2017-06-29 DIAGNOSIS — Z86718 Personal history of other venous thrombosis and embolism: Secondary | ICD-10-CM | POA: Insufficient documentation

## 2017-06-29 DIAGNOSIS — L7632 Postprocedural hematoma of skin and subcutaneous tissue following other procedure: Secondary | ICD-10-CM | POA: Diagnosis not present

## 2017-06-29 DIAGNOSIS — Z9221 Personal history of antineoplastic chemotherapy: Secondary | ICD-10-CM | POA: Insufficient documentation

## 2017-06-29 DIAGNOSIS — M009 Pyogenic arthritis, unspecified: Secondary | ICD-10-CM | POA: Diagnosis not present

## 2017-06-29 DIAGNOSIS — Z880 Allergy status to penicillin: Secondary | ICD-10-CM | POA: Insufficient documentation

## 2017-06-29 DIAGNOSIS — M96840 Postprocedural hematoma of a musculoskeletal structure following a musculoskeletal system procedure: Secondary | ICD-10-CM | POA: Diagnosis not present

## 2017-06-29 DIAGNOSIS — S81801A Unspecified open wound, right lower leg, initial encounter: Secondary | ICD-10-CM

## 2017-06-29 DIAGNOSIS — D649 Anemia, unspecified: Secondary | ICD-10-CM | POA: Insufficient documentation

## 2017-06-29 DIAGNOSIS — C7989 Secondary malignant neoplasm of other specified sites: Secondary | ICD-10-CM | POA: Diagnosis not present

## 2017-06-29 DIAGNOSIS — Z8249 Family history of ischemic heart disease and other diseases of the circulatory system: Secondary | ICD-10-CM | POA: Insufficient documentation

## 2017-06-29 DIAGNOSIS — Z820 Family history of epilepsy and other diseases of the nervous system: Secondary | ICD-10-CM | POA: Insufficient documentation

## 2017-06-29 DIAGNOSIS — Z803 Family history of malignant neoplasm of breast: Secondary | ICD-10-CM | POA: Insufficient documentation

## 2017-06-29 DIAGNOSIS — S81001A Unspecified open wound, right knee, initial encounter: Secondary | ICD-10-CM

## 2017-06-29 DIAGNOSIS — Z96651 Presence of right artificial knee joint: Secondary | ICD-10-CM | POA: Diagnosis not present

## 2017-06-29 DIAGNOSIS — Z7901 Long term (current) use of anticoagulants: Secondary | ICD-10-CM | POA: Diagnosis not present

## 2017-06-29 DIAGNOSIS — Z8489 Family history of other specified conditions: Secondary | ICD-10-CM | POA: Insufficient documentation

## 2017-06-29 DIAGNOSIS — Z811 Family history of alcohol abuse and dependence: Secondary | ICD-10-CM | POA: Insufficient documentation

## 2017-06-29 DIAGNOSIS — Z888 Allergy status to other drugs, medicaments and biological substances status: Secondary | ICD-10-CM | POA: Insufficient documentation

## 2017-06-29 HISTORY — PX: APPLICATION OF WOUND VAC: SHX5189

## 2017-06-29 HISTORY — PX: HEMATOMA EVACUATION: SHX5118

## 2017-06-29 HISTORY — PX: MASS EXCISION: SHX2000

## 2017-06-29 SURGERY — EXCISION MASS
Anesthesia: General | Laterality: Right

## 2017-06-29 MED ORDER — ROCURONIUM BROMIDE 10 MG/ML (PF) SYRINGE
PREFILLED_SYRINGE | INTRAVENOUS | Status: AC
  Filled 2017-06-29: qty 5

## 2017-06-29 MED ORDER — SUCCINYLCHOLINE CHLORIDE 200 MG/10ML IV SOSY
PREFILLED_SYRINGE | INTRAVENOUS | Status: AC
  Filled 2017-06-29: qty 10

## 2017-06-29 MED ORDER — FENTANYL CITRATE (PF) 100 MCG/2ML IJ SOLN
25.0000 ug | INTRAMUSCULAR | Status: DC | PRN
  Administered 2017-06-29: 50 ug via INTRAVENOUS

## 2017-06-29 MED ORDER — CIPROFLOXACIN IN D5W 400 MG/200ML IV SOLN
400.0000 mg | INTRAVENOUS | Status: AC
  Administered 2017-06-29: 400 mg via INTRAVENOUS
  Filled 2017-06-29: qty 200

## 2017-06-29 MED ORDER — BUPIVACAINE-EPINEPHRINE 0.25% -1:200000 IJ SOLN
INTRAMUSCULAR | Status: AC
  Filled 2017-06-29: qty 1

## 2017-06-29 MED ORDER — SODIUM CHLORIDE 0.9 % IV SOLN: 250.0000 mL | INTRAVENOUS | Status: DC | PRN

## 2017-06-29 MED ORDER — FENTANYL CITRATE (PF) 250 MCG/5ML IJ SOLN
INTRAMUSCULAR | Status: AC
  Filled 2017-06-29: qty 5

## 2017-06-29 MED ORDER — FENTANYL CITRATE (PF) 100 MCG/2ML IJ SOLN
INTRAMUSCULAR | Status: AC
  Filled 2017-06-29: qty 2

## 2017-06-29 MED ORDER — SCOPOLAMINE 1 MG/3DAYS TD PT72
MEDICATED_PATCH | TRANSDERMAL | Status: AC
  Filled 2017-06-29: qty 1

## 2017-06-29 MED ORDER — SUCCINYLCHOLINE CHLORIDE 200 MG/10ML IV SOSY
PREFILLED_SYRINGE | INTRAVENOUS | Status: DC | PRN
  Administered 2017-06-29: 100 mg via INTRAVENOUS

## 2017-06-29 MED ORDER — SODIUM CHLORIDE 0.9% FLUSH: 3.0000 mL | Freq: Two times a day (BID) | INTRAVENOUS | Status: DC

## 2017-06-29 MED ORDER — PROPOFOL 10 MG/ML IV BOLUS
INTRAVENOUS | Status: DC | PRN
  Administered 2017-06-29: 200 mg via INTRAVENOUS

## 2017-06-29 MED ORDER — BUPIVACAINE-EPINEPHRINE 0.5% -1:200000 IJ SOLN
INTRAMUSCULAR | Status: DC | PRN
  Administered 2017-06-29: 10 mL

## 2017-06-29 MED ORDER — VANCOMYCIN HCL IN DEXTROSE 1-5 GM/200ML-% IV SOLN
INTRAVENOUS | Status: AC
  Filled 2017-06-29: qty 200

## 2017-06-29 MED ORDER — SODIUM CHLORIDE 0.9% FLUSH: 3.0000 mL | INTRAVENOUS | Status: DC | PRN

## 2017-06-29 MED ORDER — LIDOCAINE 2% (20 MG/ML) 5 ML SYRINGE
INTRAMUSCULAR | Status: DC | PRN
  Administered 2017-06-29: 50 mg via INTRAVENOUS

## 2017-06-29 MED ORDER — LACTATED RINGERS IV SOLN
INTRAVENOUS | Status: DC
Start: 2017-06-29 — End: 2017-06-29
  Administered 2017-06-29: 09:00:00 via INTRAVENOUS

## 2017-06-29 MED ORDER — EPHEDRINE 5 MG/ML INJ
INTRAVENOUS | Status: AC
  Filled 2017-06-29: qty 10

## 2017-06-29 MED ORDER — DEXAMETHASONE SODIUM PHOSPHATE 10 MG/ML IJ SOLN
INTRAMUSCULAR | Status: AC
  Filled 2017-06-29: qty 1

## 2017-06-29 MED ORDER — VANCOMYCIN HCL 1000 MG IV SOLR
1000.0000 mg | Freq: Once | INTRAVENOUS | Status: AC
  Administered 2017-06-29: 1000 mg via INTRAVENOUS

## 2017-06-29 MED ORDER — SCOPOLAMINE 1 MG/3DAYS TD PT72
MEDICATED_PATCH | TRANSDERMAL | Status: DC | PRN
  Administered 2017-06-29: 1 via TRANSDERMAL

## 2017-06-29 MED ORDER — EPHEDRINE SULFATE 50 MG/ML IJ SOLN
INTRAMUSCULAR | Status: DC | PRN
  Administered 2017-06-29 (×2): 5 mg via INTRAVENOUS

## 2017-06-29 MED ORDER — FENTANYL CITRATE (PF) 100 MCG/2ML IJ SOLN
INTRAMUSCULAR | Status: DC | PRN
  Administered 2017-06-29 (×5): 50 ug via INTRAVENOUS

## 2017-06-29 MED ORDER — MIDAZOLAM HCL 5 MG/5ML IJ SOLN
INTRAMUSCULAR | Status: DC | PRN
  Administered 2017-06-29 (×2): 1 mg via INTRAVENOUS

## 2017-06-29 MED ORDER — DEXAMETHASONE SODIUM PHOSPHATE 10 MG/ML IJ SOLN
INTRAMUSCULAR | Status: DC | PRN
  Administered 2017-06-29: 10 mg via INTRAVENOUS

## 2017-06-29 MED ORDER — ONDANSETRON HCL 4 MG/2ML IJ SOLN
INTRAMUSCULAR | Status: AC
  Filled 2017-06-29: qty 2

## 2017-06-29 MED ORDER — LIDOCAINE 2% (20 MG/ML) 5 ML SYRINGE
INTRAMUSCULAR | Status: AC
  Filled 2017-06-29: qty 5

## 2017-06-29 MED ORDER — ONDANSETRON HCL 4 MG/2ML IJ SOLN
INTRAMUSCULAR | Status: DC | PRN
  Administered 2017-06-29: 4 mg via INTRAVENOUS

## 2017-06-29 MED ORDER — MIDAZOLAM HCL 2 MG/2ML IJ SOLN
INTRAMUSCULAR | Status: AC
Start: 2017-06-29 — End: ?
  Filled 2017-06-29: qty 2

## 2017-06-29 SURGICAL SUPPLY — 93 items
ADH SKN CLS APL DERMABOND .7 (GAUZE/BANDAGES/DRESSINGS)
AGENT HMST GEL NTOXPRSV FR 28 (Miscellaneous) ×1 IMPLANT
AGENT HMST PWDR BTL CLGN 5GM (Miscellaneous) ×1 IMPLANT
APL SKNCLS STERI-STRIP NONHPOA (GAUZE/BANDAGES/DRESSINGS)
APPLIER CLIP 11 MED OPEN (CLIP)
APR CLP MED 11 20 MLT OPN (CLIP)
BAG SPEC THK2 15X12 ZIP CLS (MISCELLANEOUS)
BAG ZIPLOCK 12X15 (MISCELLANEOUS) ×1 IMPLANT
BANDAGE ESMARK 6X9 LF (GAUZE/BANDAGES/DRESSINGS) ×1 IMPLANT
BENZOIN TINCTURE PRP APPL 2/3 (GAUZE/BANDAGES/DRESSINGS) IMPLANT
BINDER BREAST LRG (GAUZE/BANDAGES/DRESSINGS) IMPLANT
BINDER BREAST XLRG (GAUZE/BANDAGES/DRESSINGS) IMPLANT
BLADE HEX COATED 2.75 (ELECTRODE) ×2 IMPLANT
BLADE SAW SGTL 11.0X1.19X90.0M (BLADE) IMPLANT
BNDG CMPR 9X6 STRL LF SNTH (GAUZE/BANDAGES/DRESSINGS) ×1
BNDG COHESIVE 4X5 TAN STRL (GAUZE/BANDAGES/DRESSINGS) ×1 IMPLANT
BNDG ESMARK 6X9 LF (GAUZE/BANDAGES/DRESSINGS) ×2
BNDG GAUZE ELAST 4 BULKY (GAUZE/BANDAGES/DRESSINGS) ×1 IMPLANT
CHLORAPREP W/TINT 26ML (MISCELLANEOUS) ×1 IMPLANT
CLIP APPLIE 11 MED OPEN (CLIP) ×1 IMPLANT
COLLAGEN CELLERATERX 5 GRAM (Miscellaneous) ×1 IMPLANT
COVER MAYO STAND STRL (DRAPES) ×1 IMPLANT
COVER SURGICAL LIGHT HANDLE (MISCELLANEOUS) ×3 IMPLANT
CUFF TOURN SGL QUICK 18 (TOURNIQUET CUFF) ×1 IMPLANT
CUFF TOURN SGL QUICK 24 (TOURNIQUET CUFF)
CUFF TOURN SGL QUICK 34 (TOURNIQUET CUFF) ×2
CUFF TRNQT CYL 24X4X40X1 (TOURNIQUET CUFF) ×1 IMPLANT
CUFF TRNQT CYL 34X4X40X1 (TOURNIQUET CUFF) ×1 IMPLANT
DERMABOND ADVANCED (GAUZE/BANDAGES/DRESSINGS)
DERMABOND ADVANCED .7 DNX12 (GAUZE/BANDAGES/DRESSINGS) ×2 IMPLANT
DRAIN CHANNEL 19F RND (DRAIN) ×2 IMPLANT
DRAIN PENROSE 18X1/2 LTX STRL (DRAIN) ×1 IMPLANT
DRAPE LAPAROSCOPIC ABDOMINAL (DRAPES) IMPLANT
DRAPE ORTHO SPLIT 77X108 STRL (DRAPES)
DRAPE SHEET LG 3/4 BI-LAMINATE (DRAPES) IMPLANT
DRAPE SURG ORHT 6 SPLT 77X108 (DRAPES) IMPLANT
DRAPE UTILITY XL STRL (DRAPES) ×2 IMPLANT
DRSG EMULSION OIL 3X16 NADH (GAUZE/BANDAGES/DRESSINGS) ×1 IMPLANT
DRSG EMULSION OIL 3X3 NADH (GAUZE/BANDAGES/DRESSINGS) ×1 IMPLANT
DRSG MEPILEX BORDER 4X12 (GAUZE/BANDAGES/DRESSINGS) ×1 IMPLANT
DRSG MEPILEX BORDER 4X4 (GAUZE/BANDAGES/DRESSINGS) ×1 IMPLANT
DRSG PAD ABDOMINAL 8X10 ST (GAUZE/BANDAGES/DRESSINGS) ×1 IMPLANT
DURAPREP 26ML APPLICATOR (WOUND CARE) ×1 IMPLANT
ELECT REM PT RETURN 15FT ADLT (MISCELLANEOUS) ×3 IMPLANT
EVACUATOR SILICONE 100CC (DRAIN) ×2 IMPLANT
GAUZE SPONGE 4X4 12PLY STRL (GAUZE/BANDAGES/DRESSINGS) ×2 IMPLANT
GEL CELLERATE 28G (Miscellaneous) ×1 IMPLANT
GLOVE BIO SURGEON STRL SZ7 (GLOVE) ×1 IMPLANT
GLOVE BIOGEL M 7.0 STRL (GLOVE) IMPLANT
GLOVE BIOGEL PI IND STRL 7.5 (GLOVE) ×2 IMPLANT
GLOVE BIOGEL PI IND STRL 8.5 (GLOVE) ×1 IMPLANT
GLOVE BIOGEL PI INDICATOR 7.5 (GLOVE) ×1
GLOVE BIOGEL PI INDICATOR 8.5 (GLOVE)
GLOVE ECLIPSE 8.0 STRL XLNG CF (GLOVE) ×1 IMPLANT
GLOVE ORTHO TXT STRL SZ7.5 (GLOVE) ×3 IMPLANT
GLOVE SURG ORTHO 8.0 STRL STRW (GLOVE) ×2 IMPLANT
GOWN STRL REUS W/TWL LRG LVL3 (GOWN DISPOSABLE) ×5 IMPLANT
GOWN STRL REUS W/TWL XL LVL3 (GOWN DISPOSABLE) ×3 IMPLANT
HANDPIECE INTERPULSE COAX TIP (DISPOSABLE) ×2
KIT BASIN OR (CUSTOM PROCEDURE TRAY) ×4 IMPLANT
KIT PREVENA INCISION MGT20CM45 (CANNISTER) ×1 IMPLANT
MANIFOLD NEPTUNE II (INSTRUMENTS) ×2 IMPLANT
NDL HYPO 25X1 1.5 SAFETY (NEEDLE) IMPLANT
NEEDLE HYPO 25X1 1.5 SAFETY (NEEDLE) ×2 IMPLANT
PACK GENERAL/GYN (CUSTOM PROCEDURE TRAY) ×2 IMPLANT
PACK ORTHO EXTREMITY (CUSTOM PROCEDURE TRAY) ×2 IMPLANT
PAD CAST 4YDX4 CTTN HI CHSV (CAST SUPPLIES) ×1 IMPLANT
PADDING CAST COTTON 4X4 STRL (CAST SUPPLIES)
POSITIONER SURGICAL ARM (MISCELLANEOUS) ×1 IMPLANT
SET HNDPC FAN SPRY TIP SCT (DISPOSABLE) ×1 IMPLANT
SOL PREP PROV IODINE SCRUB 4OZ (MISCELLANEOUS) ×2 IMPLANT
SPONGE DRAIN TRACH 4X4 STRL 2S (GAUZE/BANDAGES/DRESSINGS) IMPLANT
SPONGE LAP 18X18 X RAY DECT (DISPOSABLE) IMPLANT
STAPLER VISISTAT 35W (STAPLE) ×2 IMPLANT
STOCKINETTE 6  STRL (DRAPES)
STOCKINETTE 6 STRL (DRAPES) IMPLANT
STOCKINETTE 8 INCH (MISCELLANEOUS) IMPLANT
STRIP CLOSURE SKIN 1/2X4 (GAUZE/BANDAGES/DRESSINGS) IMPLANT
SUT ETHILON 2 0 PS N (SUTURE) ×2 IMPLANT
SUT MON AB 5-0 PS2 18 (SUTURE) ×2 IMPLANT
SUT SILK 3 0 SH 30 (SUTURE) ×1 IMPLANT
SUT VIC AB 2-0 BRD 54 (SUTURE) IMPLANT
SUT VIC AB 2-0 CT1 27 (SUTURE) ×2
SUT VIC AB 2-0 CT1 TAPERPNT 27 (SUTURE) IMPLANT
SUT VIC AB 2-0 SH 27 (SUTURE) ×2
SUT VIC AB 2-0 SH 27X BRD (SUTURE) IMPLANT
SUT VIC AB 3-0 SH 8-18 (SUTURE) ×2 IMPLANT
SUT VIC AB 5-0 PS2 18 (SUTURE) ×1 IMPLANT
SYR CONTROL 10ML LL (SYRINGE) ×2 IMPLANT
TOWEL OR 17X26 10 PK STRL BLUE (TOWEL DISPOSABLE) ×5 IMPLANT
TOWEL OR NON WOVEN STRL DISP B (DISPOSABLE) ×3 IMPLANT
TRAY FOLEY W/METER SILVER 14FR (SET/KITS/TRAYS/PACK) IMPLANT
TRAY FOLEY W/METER SILVER 16FR (SET/KITS/TRAYS/PACK) IMPLANT

## 2017-06-29 NOTE — Anesthesia Postprocedure Evaluation (Signed)
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 SHOULDER, PLACEMENT OF CELLERATE (Right ) APPLICATION OF WOUND VAC (Right ) EVACUATION HEMATOMA OF RIGHT KNEE (Right ) EVACUATION RIGHT TOTAL KNEE HEMATOMA (Right )     Patient location during evaluation: PACU Anesthesia Type: General Level of consciousness: awake Pain management: pain level controlled Vital Signs Assessment: post-procedure vital signs reviewed and stable Respiratory status: spontaneous breathing Cardiovascular status: stable Anesthetic complications: no    Last Vitals:  Vitals:   06/29/17 1158 06/29/17 1251  BP: 128/71 (!) 148/75  Pulse: 100 (!) 106  Resp: 17 18  Temp: 36.6 C 36.7 C  SpO2: 97% 95%    Last Pain:  Vitals:   06/29/17 1251  TempSrc: Oral  PainSc: 2                  Krystiana Fornes

## 2017-06-29 NOTE — H&P (Signed)
[name] is an 62 y.o. female.   Chief Complaint: right knee hematoma and right chest / breast cancer to skin HPI: The patient is a 62 yrs old wf here for treatment of her right knee hematoma and right chest / breast cancer to skin.  She has been dealing with the skin breakdown for several weeks.  She is very tender in the area.  The wounds are breaking down without signs of healing.  The hematoma of the knee occurred after the joint surgery.  Dr. Alvan Dame aware and will see her this morning.  Past Medical History:  Diagnosis Date  . 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 Allergic reaction 07/24/2016  . Anemia    needed transfusion spring of 2018  . Arthritis KNEES   right knee, hx. past left knee replacemnt.  . Blood clot in vein    around Portacath right chest-tx. Eliquis about a yr., prior Lovenox.  . Blood dyscrasia   . Chronic anticoagulation HX  PORT-A-CATH CLOT   2010 - HAS BEEN ON LOVENOX SINCE THE CLOT  . Drug rash 07/24/2016  . Elevated blood pressure reading without diagnosis of hypertension    PT MONITORS HER B/P AT HOME  . Heart murmur   . Hematoma of leg, right, sequela   . 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 PONV (postoperative nausea and vomiting)    ponv likes scopolamine patch  . Port-A-Cath in place    RIGHT CHEST   . Skin cancer of anterior chest BREAST CANCER PRIMARY W/ METS TO CHEST WALL SKIN CANCER--  CHEMOTHERAPY EVERY 3 WEEKS AT [facility]   left 9 yrs ago, chemo every 3 weeks at [place], last chemo july 3rd  . Skin changes related to chemotherapy    per patient she has scabbed over skin circumventing port to r ight upper chest ; denie drainage and state it is due to chemptherapy   . Status post skin flap graft    right chest wall with metastatis right anterior chest -no drainage or open wound.    Past Surgical History:    Procedure Laterality Date  . APPLICATION OF A-CELL OF BACK Right 07/31/2016   Procedure: CELLERATE COLLAGEN PLACEMENT;  Surgeon: Loel Lofty Alesia Oshields, DO;  Location: [place];  Service: Plastics;  Laterality: Right;  . CHEST WALL TUMOR EXCISION     right  . EXCISIONAL TOTAL KNEE ARTHROPLASTY WITH ANTIBIOTIC SPACERS Right 12/02/2016   Procedure: EXCISIONAL TOTAL KNEE ARTHROPLASTY WITH ANTIBIOTIC SPACERS;  Surgeon: Paralee Cancel, MD;  Location: [facility];  Service: Orthopedics;  Laterality: Right;  90 mins  . hemotoma     evacuation left chest wall  . I&D KNEE WITH POLY EXCHANGE Right 05/28/2016   Procedure: IRRIGATION AND DEBRIDEMENT KNEE WOUND VAC PLACMENT;  Surgeon: Susa Day, MD;  Location: [facility];  Service: Orthopedics;  Laterality: Right;  . I&D KNEE WITH POLY EXCHANGE Right 05/30/2016   Procedure: RADICAL SYNOVECTOMY,IRRIGATION AND DEBRIDEMENT KNEE WITH POLY EXCHANGE WITH ANTIBIOTIC BEADS, APPLICATION OF WOUND VAC;  Surgeon: Susa Day, MD;  Location: [facility];  Service: Orthopedics;  Laterality: Right;  . INCISION AND DRAINAGE OF WOUND Right 07/31/2016   Procedure: IRRIGATION AND DEBRIDEMENT RIGHT KNEE  WOUND;  Surgeon: Loel Lofty Kaetlyn Noa, DO;  Location: [place];  Service: Plastics;  Laterality: Right;  . IR FLUORO GUIDE  CV LINE LEFT  04/30/2017  . IR US GUIDE VASC ACCESS LEFT  04/30/2017  . IRRIGATION AND DEBRIDEMENT KNEE Right 04/12/2016   Procedure: IRRIGATION AND DEBRIDEMENT KNEE;  Surgeon: Nicholes Stairs, MD;  Location: [facility];  Service: Orthopedics;  Laterality: Right;  . IRRIGATION AND DEBRIDEMENT KNEE Right 12/16/2016   Procedure: Repeat irrigation and debridement right knee, wound closure  wound vac REPLACEMENT ANTIBIOTIC SPACERS;  Surgeon: Paralee Cancel, MD;  Location: [facility];  Service: Orthopedics;  Laterality: Right;  . KNEE ARTHROSCOPY  02/13/2012   Procedure: ARTHROSCOPY KNEE;  Surgeon: Johnn Hai, MD;  Location: [facility];  Service: Orthopedics;  Laterality:  Left;  WITH DEBRIDEMENt   . KNEE ARTHROSCOPY WITH LATERAL MENISECTOMY  02/13/2012   Procedure: KNEE ARTHROSCOPY WITH LATERAL MENISECTOMY;  Surgeon: Johnn Hai, MD;  Location: [place];  Service: Orthopedics;;  partial  . LEFT MODIFIED RADICAL MASTECTOMY/ RIGHT TOTAL MASTECTOMY  11-13-2007   LEFT BREAST CANCER W/ AXILLARY LYMPH NODE METASTASIS AND POST NEOADJUVANT CHEMO  . MASTECTOMY     Bilateral  . PLACEMENT PORT-A-CATH  06/22/2007   right chest new port placed 2015, right chest  . REIMPLANTATION OF TOTAL KNEE Right 04/27/2017   Procedure: Reimplantation of right total knee arthroplasty;  Surgeon: Paralee Cancel, MD;  Location[facility];  Service: Orthopedics;  Laterality: Right;  Adductor Block  . SKIN GRAFT     chest-post tumor removal, second occurrence of cancer" 2015 surgery for Flap 2015"  . SKIN SPLIT GRAFT Right 01/29/2017   Procedure: SKIN GRAFT SPLIT THICKNESS TO RIGHT KNEE WOUND;  Surgeon: Wallace Going, DO;  Location[facility];  Service: Plastics;  Laterality: Right;  . TOTAL KNEE ARTHROPLASTY Left 09/23/2012   Procedure: LEFT TOTAL KNEE ARTHROPLASTY;  Surgeon: Johnn Hai, MD;  Location[facility];  Service: Orthopedics;  Laterality: Left;  . TOTAL KNEE ARTHROPLASTY Right 03/20/2016   Procedure: RIGHT TOTAL KNEE ARTHROPLASTY;  Surgeon: Susa Day, MD;  [facility];  Service: Orthopedics;  Laterality: Right;  . TRANSTHORACIC ECHOCARDIOGRAM  11-18-2011  DR BENSIMHON (ECHO EVERY 3 MONTHS)   HX CHEMO INDUCED CARDIOTOXICITY/  LVSF NORMAL/ EF 55-50%/ MILD MITRIAL VALVE REGURG./ MILDLY INCREASED SYSTOLIC PRESSURE OF PULMONARY ARTERIES  . VASCULAR SURGERY Right    portacath chest    Family History  Problem Relation Age of Onset  . Heart disease Mother        due to mitral valve regurgiation,   . Cancer Mother        breast  . Allergic rhinitis Mother   . Parkinsonism Father   . Allergic rhinitis Father   . Migraines Father   . Alcohol abuse  Paternal Grandfather   . Angioedema Neg Hx   . Asthma Neg Hx   . Eczema Neg Hx    Social History:  reports that  has never smoked. she has never used smokeless tobacco. She reports that she does not drink alcohol or use drugs.  Allergies:  Allergies  Allergen Reactions  . Penicillins Hives, Itching and Rash    Has patient had a PCN reaction causing immediate rash, facial/tongue/throat swelling, SOB or lightheadedness with hypotension: yes Has patient had a PCN reaction causing severe rash involving mucus membranes or skin necrosis: No Has patient had a PCN reaction that required hospitalization No Has patient had a PCN reaction occurring within the last 10 years: yes If all of the above answers are "NO", then may proceed with Cephalosporin use.  Denies airway involvement   . Other Rash    STERI STRIPS - Blisters  . Tape Hives    Can tolerate paper tape    Medications Prior to Admission  Medication Sig Dispense Refill  . acetaminophen (TYLENOL) 500 MG tablet Take 500 mg by mouth every 8 (eight) hours as needed for mild pain or headache.    . Ado-Trastuzumab Emtansine (KADCYLA IV) Inject into the vein every 21 ( twenty-one) days. At [place] 2019    . cetirizine (ZYRTEC) 10 MG tablet Take 1 tablet (10 mg total) daily by mouth. 30 tablet 11  . Cholecalciferol (VITAMIN D3) 2000 UNITS TABS Take 2,000 Units by mouth 4 (four) times a week.     . darifenacin (ENABLEX) 15 MG 24 hr tablet Take 15 mg by mouth daily.  10  . docusate sodium (COLACE) 100 MG capsule Take 1 capsule (100 mg total) by mouth 2 (two) times daily. 10 capsule 0  . doxycycline (VIBRA-TABS) 100 MG tablet Take 100 mg by mouth 2 (two) times daily. 6 month course started 05-2017    . ferrous sulfate (FERROUSUL) 325 (65 FE) MG tablet Take 1 tablet (325 mg total) by mouth 3 (three) times daily with meals. 90 tablet 1  . gabapentin (NEURONTIN) 300 MG capsule TAKE 1 CAPSULE BY MOUTH 3 TIMES DAILY (Patient  taking differently: TAKE 1 CAPSULE BY MOUTH 4 TIMES DAILY FOR 30 DAYS) 90 capsule 6  . Multiple Vitamin (MULTIVITAMIN WITH MINERALS) TABS tablet Take 1 tablet by mouth daily.    . ondansetron (ZOFRAN) 8 MG tablet Take 1 tablet (8 mg total) by mouth every 8 (eight) hours as needed for nausea. (Patient taking differently: Take 8 mg by mouth every 8 (eight) hours as needed for nausea or vomiting. Uses after chemo treatments) 20 tablet 6  . oxyCODONE (OXY IR/ROXICODONE) 5 MG immediate release tablet Take 1-3 tablets (5-15 mg total) by mouth every 4 (four) hours as needed for severe pain. (Patient taking differently: Take 5 mg by mouth every 6 (six) hours as needed for severe pain. ) 90 tablet 0  . polyethylene glycol (MIRALAX / GLYCOLAX) packet Take 17 g by mouth 2 (two) times daily. (Patient taking differently: Take 17 g by mouth daily as needed for mild constipation. ) 14 each 0  . zolpidem (AMBIEN) 5 MG tablet TAKE 1 TABLET BY MOUTH ONCE DAILY AT BEDTIME AS NEEDED FOR SLEEP 30 tablet 5  . celecoxib (CELEBREX) 200 MG capsule Take 1 capsule (200 mg total) by mouth every 12 (twelve) hours. (Patient not taking: Reported on 05/01/2017) 60 capsule 1  . enoxaparin (LOVENOX) 80 MG/0.8ML injection Inject 0.4 mLs (40 mg total) into the skin daily. (Patient taking differently: Inject 70 mg into the skin daily. ) 14 Syringe 0  . furosemide (LASIX) 20 MG tablet Take 1 tablet (20 mg total) by mouth daily as needed for edema. (Patient taking differently: Take 20 mg by mouth daily as needed for edema. ) 30 tablet 0  . methocarbamol (ROBAXIN) 500 MG tablet Take 1 tablet (500 mg total) by mouth every 6 (six) hours as needed for muscle spasms. (Patient not taking: Reported on 06/24/2017) 40 tablet 0    No results found for this or any previous visit (from the past 48 hour(s)). No results found.  Review of Systems  Constitutional: Negative.   HENT: Negative.   Eyes: Negative.   Respiratory: Negative.   Cardiovascular:  Negative.   Gastrointestinal: Negative.  Genitourinary: Negative.   Musculoskeletal: Positive for joint pain.  Skin: Negative.   Neurological: Negative.   Psychiatric/Behavioral: Negative.     Blood pressure 117/73, pulse (!) 103, temperature 98.8 F (37.1 C), temperature source Oral, resp. rate 16, height _0  (1.702 m), weight 73.9 kg (163 lb), SpO2 96 %. Physical Exam  Constitutional: She is oriented to person, place, and time. She appears well-developed and well-nourished.  HENT:  Head: Normocephalic and atraumatic.  Eyes: EOM are normal. Pupils are equal, round, and reactive to light.  Cardiovascular: Normal rate.  Respiratory: Effort normal.    Neurological: She is alert and oriented to person, place, and time.  Skin: Skin is warm. There is erythema.  Psychiatric: She has a normal mood and affect. Her behavior is normal. Judgment and thought content normal.     Assessment/Plan Plan for drainage of right knee hematoma and excision of some of the right chest / breast cancer with cellerate placement. We discussed risks and the need to go slow without a large major resection to see if we can get some healing.  [place], DO 06/29/2017, 9:06 AM

## 2017-06-29 NOTE — Anesthesia Procedure Notes (Addendum)
Procedure Name: Intubation Date/Time: 06/29/2017 10:10 AM Performed by: Maxwell Caul, CRNA Pre-anesthesia Checklist: Patient identified, Emergency Drugs available, Suction available and Patient being monitored Patient Re-evaluated:Patient Re-evaluated prior to induction Oxygen Delivery Method: Circle system utilized Preoxygenation: Pre-oxygenation with 100% oxygen Induction Type: IV induction Ventilation: Mask ventilation without difficulty Laryngoscope Size: Mac and 4 Grade View: Grade I Tube type: Oral Tube size: 7.5 mm Number of attempts: 1 Airway Equipment and Method: Stylet and Oral airway Placement Confirmation: ETT inserted through vocal cords under direct vision,  positive ETCO2 and breath sounds checked- equal and bilateral Secured at: 21 cm Tube secured with: Tape Dental Injury: Teeth and Oropharynx as per pre-operative assessment

## 2017-06-29 NOTE — Op Note (Signed)
DATE OF OPERATION: 06/29/2017  LOCATION: [facility] Main Operating Room  PREOPERATIVE DIAGNOSIS: Right chest / Breast cancer to skin  POSTOPERATIVE DIAGNOSIS: Same   PROCEDURE: Excision of right chest / Breast cancer to skin 6 x 6 cm with placement of cellerate powder and gel.  SURGEON: Jamina Macbeth Sanger Taci Sterling, DO  EBL: 5 cc  CONDITION: Stable  COMPLICATIONS: None  INDICATION: The patient, [name], is a 62 y.o. female born on [birth year], is here for treatment of breast cancer metastasis to the skin.   PROCEDURE DETAILS:  The patient was seen prior to surgery and marked.  The IV antibiotics were given. The patient was taken to the operating room and given a general anesthetic. A standard time out was performed and all information was confirmed by those in the room. SCD was placed on the left leg.   The right chest was prepped and draped in the usual sterile fashion.  Local was injected for intraoperative hemostasis and postoperative pain control.   The lateral right chest lesions was marked short stitch 12 o'clock and long stitch 3 o'clock.  The #10 blade was used to excise the 6 x 6 cm lesion.  There was irregular tissue noted at the base.  This was also excised at the center of the lesion.  Stitch on superficial side.  Hemostasis was achieved with the electrocautery.  All of the cellerate powder and gel was applied.  It was covered with adaptic and sutured in place. KY gel and sterile dressing was applied.  Dr. Alvan Dame performed the evacuation of the hematoma.  The patient was allowed to wake up and taken to recovery room in stable condition at the end of the case. The family was notified at the end of the case.

## 2017-06-29 NOTE — Progress Notes (Signed)
Consulted Dr Alvan Dame regarding restarting Lovenox. He is ok with her restarting full dose of 70mg  daily on wed 07/01/17.  Pt and her daughter verbalize understanding and will furthermore consult her hemotologist, as well, prior to dosing.

## 2017-06-29 NOTE — Brief Op Note (Signed)
06/29/2017  1:21 PM  PATIENT:  [name]  62 y.o. female  PRE-OPERATIVE DIAGNOSIS:  Right knee post op hematoma status post re-implantation  POST-OPERATIVE DIAGNOSIS:  Right knee post op hematoma status post re-implantation  PROCEDURE:  Procedure(s): APPLICATION OF WOUND VAC (Right) EVACUATION HEMATOMA OF RIGHT KNEE (Right) EVACUATION RIGHT TOTAL KNEE HEMATOMA (Right)  SURGEON:  Surgeon(s) and Role: Panel 1:    * D[place], Loel Lofty, DO - Primary Panel 2:    * Paralee Cancel, MD - Primary  PHYSICIAN ASSISTANT: Danae Orleans, PA-C  ANESTHESIA:   general  EBL:  25 mL   BLOOD ADMINISTERED:none  DRAINS: none   LOCAL MEDICATIONS USED:  NONE  SPECIMEN:  No Specimen  DISPOSITION OF SPECIMEN:  N/A  COUNTS:  YES  TOURNIQUET:   Total Tourniquet Time Documented: Thigh (Right) - 28 minutes Total: Thigh (Right) - 28 minutes   DICTATION: .Other Dictation: Dictation Number [id number]  PLAN OF CARE: Discharge to home after PACU  PATIENT DISPOSITION:  PACU - hemodynamically stable.   Delay start of Pharmacological VTE agent (>24hrs) due to surgical blood loss or risk of bleeding: no

## 2017-06-29 NOTE — Transfer of Care (Signed)
Immediate Anesthesia Transfer of Care Note  Patient: ROZELL KETTLEWELL  Procedure(s) Performed: EXCISION OF METASTATIC BREAST CANCER TO SKIN RIGHT SHOULDER, PLACEMENT OF CELLERATE (Right ) APPLICATION OF WOUND VAC (Right ) EVACUATION HEMATOMA OF RIGHT KNEE (Right ) EVACUATION RIGHT TOTAL KNEE HEMATOMA (Right )  Patient Location: PACU  Anesthesia Type:General  Level of Consciousness: awake, alert  and oriented  Airway & Oxygen Therapy: Patient Spontanous Breathing and Patient connected to face mask oxygen  Post-op Assessment: Report given to RN  Post vital signs: Reviewed and stable  Last Vitals:  Vitals:   06/29/17 0755 06/29/17 1112  BP: 117/73 122/76  Pulse: (!) 103 93  Resp: 16 15  Temp: 37.1 C 36.5 C  SpO2: 96% 97%    Last Pain:  Vitals:   06/29/17 0831  TempSrc:   PainSc: 0-No pain      Patients Stated Pain Goal: 4 (03/47/42 5956)  Complications: No apparent anesthesia complications

## 2017-06-29 NOTE — Anesthesia Preprocedure Evaluation (Signed)
Anesthesia Evaluation  Patient identified by MRN, date of birth, ID band Patient awake    Reviewed: Allergy & Precautions, NPO status , Patient's Chart, lab work & pertinent test results  History of Anesthesia Complications (+) PONV  Airway Mallampati: II  TM Distance: >3 FB     Dental   Pulmonary neg pulmonary ROS,    breath sounds clear to auscultation       Cardiovascular + Valvular Problems/Murmurs  Rhythm:Regular Rate:Normal     Neuro/Psych    GI/Hepatic negative GI ROS, Neg liver ROS,   Endo/Other  negative endocrine ROS  Renal/GU negative Renal ROS     Musculoskeletal  (+) Arthritis ,   Abdominal   Peds  Hematology  (+) anemia ,   Anesthesia Other Findings   Reproductive/Obstetrics                             Anesthesia Physical Anesthesia Plan  ASA: III  Anesthesia Plan: General   Post-op Pain Management:    Induction: Intravenous  PONV Risk Score and Plan: Treatment may vary due to age or medical condition, Ondansetron, Dexamethasone, Propofol infusion and Midazolam  Airway Management Planned: Oral ETT  Additional Equipment:   Intra-op Plan:   Post-operative Plan: Extubation in OR  Informed Consent: I have reviewed the patients History and Physical, chart, labs and discussed the procedure including the risks, benefits and alternatives for the proposed anesthesia with the patient or authorized representative who has indicated his/her understanding and acceptance.   Dental advisory given  Plan Discussed with: Anesthesiologist and CRNA  Anesthesia Plan Comments:         Anesthesia Quick Evaluation

## 2017-06-29 NOTE — Interval H&P Note (Signed)
History and Physical Interval Note:  06/29/2017 9:42 AM  [name]  has presented today for surgery, with the diagnosis of CARCINOMA OF RIGHT BREAST METASTATIC TO SKIN, INFECTION ASSOCIATED WITH INTERNAL RIGHT KNEE PROSTHESES  The various methods of treatment have been discussed with the patient and family. After consideration of risks, benefits and other options for treatment, the patient has consented to  Procedure(s): EXCISION OF METASTATIC BREAST CANCER TO SKIN RIGHT SHOULDER, PLACEMENT OF CELLERATE (Right) APPLICATION OF WOUND VAC (Right) EVACUATION HEMATOMA OF RIGHT KNEE (Right) EVACUATION RIGHT TOTAL KNEE HEMATOMA (Right) as a surgical intervention .  The patient's history has been reviewed, patient examined, no change in status, stable for surgery.  I have reviewed the patient's chart and labs.  Questions were answered to the patient's satisfaction.     Mauri Pole

## 2017-06-29 NOTE — Discharge Instructions (Signed)
[place] gel to the right chest daily.  Don't get wet. Follow up with Dr. Alvan Dame and Dillingham in one week. Prevena VAC to right knee.  Will remove in 1 week.  Do not change.  General Anesthesia, Adult, Care After These instructions provide you with information about caring for yourself after your procedure. Your health care provider may also give you more specific instructions. Your treatment has been planned according to current medical practices, but problems sometimes occur. Call your health care provider if you have any problems or questions after your procedure. What can I expect after the procedure? After the procedure, it is common to have:  Vomiting.  A sore throat.  Mental slowness.  It is common to feel:  Nauseous.  Cold or shivery.  Sleepy.  Tired.  Sore or achy, even in parts of your body where you did not have surgery.  Follow these instructions at home: For at least 24 hours after the procedure:  Do not: ? Participate in activities where you could fall or become injured. ? Drive. ? Use heavy machinery. ? Drink alcohol. ? Take sleeping pills or medicines that cause drowsiness. ? Make important decisions or sign legal documents. ? Take care of children on your own.  Rest. Eating and drinking  If you vomit, drink water, juice, or soup when you can drink without vomiting.  Drink enough fluid to keep your urine clear or pale yellow.  Make sure you have little or no nausea before eating solid foods.  Follow the diet recommended by your health care provider. General instructions  Have a responsible adult stay with you until you are awake and alert.  Return to your normal activities as told by your health care provider. Ask your health care provider what activities are safe for you.  Take over-the-counter and prescription medicines only as told by your health care provider.  If you smoke, do not smoke without supervision.  Keep all follow-up visits as told  by your health care provider. This is important. Contact a health care provider if:  You continue to have nausea or vomiting at home, and medicines are not helpful.  You cannot drink fluids or start eating again.  You cannot urinate after 8-12 hours.  You develop a skin rash.  You have fever.  You have increasing redness at the site of your procedure. Get help right away if:  You have difficulty breathing.  You have chest pain.  You have unexpected bleeding.  You feel that you are having a life-threatening or urgent problem. This information is not intended to replace advice given to you by your health care provider. Make sure you discuss any questions you have with your health care provider. Document Released: 08/18/2000 Document Revised: 10/15/2015 Document Reviewed: 04/26/2015 Elsevier Interactive Patient Education  [employer].

## 2017-06-30 NOTE — Op Note (Signed)
NAME:  [name], [name]                       ACCOUNT NO.:  MEDICAL RECORD NO.:  [id number]  LOCATION:                                 FACILITY:  PHYSICIAN:  Pietro Cassis. Alvan Dame, M.D.  DATE OF BIRTH:  09/22/1955  DATE OF PROCEDURE:  06/29/2017 DATE OF DISCHARGE:                              OPERATIVE REPORT   PREOPERATIVE DIAGNOSIS:  Status post right total knee reimplantation with postoperative hematoma, most likely related to her required anticoagulation.  POSTOPERATIVE DIAGNOSIS:  Status post right total knee reimplantation with postoperative hematoma, most likely related to her required anticoagulation.  PROCEDURE:  Open excision and nonexcisional evacuation of right lower leg hematoma.  FINDINGS:  See body of the dictated operative note.  SURGEON:  Pietro Cassis. Alvan Dame, M.D.  ASSISTANT:  Danae Orleans, PA-C.  ANESTHESIA:  General.  Please note that this case was combined with the procedure by Dr. Marla Roe.  INDICATIONS FOR PROCEDURE:  [name] is a very pleasant 62 year old female, who underwent a reimplantation of her right knee to a two-stage technique approximately 2 months ago.  She had been progressing well, but had recurrent swelling over the distal aspect of her incision. Attempted evacuation of this in the office had revealed seroma.  Given the recurrence, we discussed repeating the injection versus taken to the operating room and I and D.  Unfortunately, at the last visit, she had just seen Dr. Marla Roe and was having some consideration of having surgery on some cutaneous manifestations of her breast cancer.  We decided after conversing together to do this at the same time, risks of recurrent hematoma and need for potential future procedures were discussed.  The risk of infection reviewed, consent was obtained for management.  PROCEDURE IN DETAIL:  The patient was brought to the operative theater. Once adequate anesthesia was established adequately positioned for  both procedures, the right thigh tourniquet was placed.  The right lower extremity was prepped and draped in sterile fashion.  Time-out was performed for each of the procedures and identified the patient, planned procedure and extremities.  Leg was exsanguinated, elevated to 200 mmHg for a total of 28 minutes.  The distal aspect of incision had been marked and discussed preoperatively.  It was incised and excising probably about 2-3 inches of the skin.  Medially, identified a significant hematoma, which was evacuated manually.  Following sharp excisional debridement of the nonviable-appearing soft tissues in the subcutaneous layer, I did a nonexcisional debridement with 2 liters of normal saline solution in this area.  There was no evidence of this area penetrating her joint.  It was in the proximal aspect of the tibia. Suture line evident from prior surgery and intact.  Once I concluded there was definitely a vacuum space in this area, we reapproximated the subcutaneous layer with 2-0 Vicryl and then did 2-0 nylon on the skin leaving some looseness to the closure, so we could place a Prevena dressing to allow for wound suction.  Once the wound was closed, the dressing was applied and the leg wrapped with an Ace wrap.  Findings were reviewed with her husband.  We have asked that she  hold off on her Lovenox initiation back to Wednesday.  I will plan to see her back in the office in a week to evaluate the wound and to make certain this routine healing.     Pietro Cassis Alvan Dame, M.D.     MDO/MEDQ  D:  06/29/2017  T:  06/29/2017  Job:  [id number]

## 2017-07-01 MED FILL — oxyCODONE HCL 5 MG TABS: 5 | 8 days supply | Qty: 60 | Fill #0

## 2017-07-01 MED FILL — ENOXAPARIN 80 MG/0.8 ML SYR: 80 | 30 days supply | Qty: 24 | Fill #4

## 2017-07-06 ENCOUNTER — Emergency Department (HOSPITAL_COMMUNITY)
Admission: EM | Admit: 2017-07-06 | Discharge: 2017-07-06 | Disposition: A | Discharge status: Home / Self Care | Payer: 59 | Attending: Emergency Medicine | Admitting: Emergency Medicine

## 2017-07-06 DIAGNOSIS — Z959 Presence of cardiac and vascular implant and graft, unspecified: Secondary | ICD-10-CM | POA: Diagnosis not present

## 2017-07-06 DIAGNOSIS — Y929 Unspecified place or not applicable: Secondary | ICD-10-CM | POA: Insufficient documentation

## 2017-07-06 DIAGNOSIS — Y658 Other specified misadventures during surgical and medical care: Secondary | ICD-10-CM | POA: Insufficient documentation

## 2017-07-06 DIAGNOSIS — Z9013 Acquired absence of bilateral breasts and nipples: Secondary | ICD-10-CM | POA: Diagnosis not present

## 2017-07-06 DIAGNOSIS — T148XXA Other injury of unspecified body region, initial encounter: Secondary | ICD-10-CM | POA: Diagnosis not present

## 2017-07-06 DIAGNOSIS — Y939 Activity, unspecified: Secondary | ICD-10-CM | POA: Insufficient documentation

## 2017-07-06 DIAGNOSIS — Z880 Allergy status to penicillin: Secondary | ICD-10-CM | POA: Insufficient documentation

## 2017-07-06 DIAGNOSIS — Y998 Other external cause status: Secondary | ICD-10-CM | POA: Insufficient documentation

## 2017-07-06 DIAGNOSIS — D649 Anemia, unspecified: Secondary | ICD-10-CM | POA: Diagnosis not present

## 2017-07-06 DIAGNOSIS — Z923 Personal history of irradiation: Secondary | ICD-10-CM | POA: Insufficient documentation

## 2017-07-06 DIAGNOSIS — Z85828 Personal history of other malignant neoplasm of skin: Secondary | ICD-10-CM | POA: Diagnosis not present

## 2017-07-06 DIAGNOSIS — Z853 Personal history of malignant neoplasm of breast: Secondary | ICD-10-CM | POA: Diagnosis not present

## 2017-07-06 DIAGNOSIS — Z79899 Other long term (current) drug therapy: Secondary | ICD-10-CM | POA: Diagnosis not present

## 2017-07-06 DIAGNOSIS — L7622 Postprocedural hemorrhage and hematoma of skin and subcutaneous tissue following other procedure: Secondary | ICD-10-CM | POA: Diagnosis present

## 2017-07-06 DIAGNOSIS — S21109A Unspecified open wound of unspecified front wall of thorax without penetration into thoracic cavity, initial encounter: Secondary | ICD-10-CM | POA: Diagnosis not present

## 2017-07-06 LAB — BASIC METABOLIC PANEL
ANION GAP: 11 (ref 5–15)
BUN: 13 mg/dL (ref 6–20)
CHLORIDE: 101 mmol/L (ref 101–111)
CO2: 26 mmol/L (ref 22–32)
Calcium: 9.3 mg/dL (ref 8.9–10.3)
Creatinine, Ser: 0.5 mg/dL (ref 0.44–1.00)
GFR calc Af Amer: >60 mL/min (ref >60)
Glucose, Bld: 109 mg/dL — ABNORMAL HIGH (ref 65–99)
POTASSIUM: 3.9 mmol/L (ref 3.5–5.1)
SODIUM: 138 mmol/L (ref 135–145)

## 2017-07-06 LAB — PROTIME-INR
INR: 0.94
PROTHROMBIN TIME: 12.5 s (ref 11.4–15.2)

## 2017-07-06 LAB — CBC
HCT: 36.1 % (ref 36.0–46.0)
HEMOGLOBIN: 11.9 g/dL — AB (ref 12.0–15.0)
MCH: 32.7 pg (ref 26.0–34.0)
MCHC: 33 g/dL (ref 30.0–36.0)
MCV: 99.2 fL (ref 78.0–100.0)
PLATELETS: 188 10*3/uL (ref 150–400)
RBC: 3.64 MIL/uL — AB (ref 3.87–5.11)
RDW: 17 % — ABNORMAL HIGH (ref 11.5–15.5)
WBC: 5.3 10*3/uL (ref 4.0–10.5)

## 2017-07-06 MED ORDER — LIDOCAINE-EPINEPHRINE (PF) 2 %-1:200000 IJ SOLN: 20.0000 mL | Freq: Once | INTRAMUSCULAR | Status: DC

## 2017-07-06 MED ORDER — LIDOCAINE-EPINEPHRINE (PF) 2 %-1:200000 IJ SOLN
INTRAMUSCULAR | Status: AC
  Administered 2017-07-06: 19:00:00
  Filled 2017-07-06: qty 20

## 2017-07-06 MED FILL — DOXYCYCLINE MONO 100 MG CAP: 100 | 30 days supply | Qty: 60 | Fill #1

## 2017-07-06 NOTE — ED Provider Notes (Addendum)
[facility] DEPT Provider Note   CSN: [id number] Arrival date & time: 07/06/17  1704     History   Chief Complaint Chief Complaint  Patient presents with  . Post-op Problem    HPI [name] is a 62 y.o. female.  HPI   62 year old female with extensive past medical history as below including breast cancer as well as localized skin cancer and lesion secondary to radiation here with bleeding from postop site.  The patient recently had a large biopsy taken from multiple sites on her right upper chest with plastic surgery.  This was 1 week ago.  She had improvement and the dressing was changed in clinic today.  Since then, she is had intermittent, but progressively more severe bleeding.  Husband has applied quick clot dressings without significant relief.  She subsequently presents for evaluation.  Denies any associated chest pain, shortness of breath, cough, or other symptoms of anemia.  No lightheadedness or dizziness.  The wounds were hemostatic prior to the dressing change.  Her plastic surgeon's office was closed so she presents for further evaluation.  Denies any other complaints.  Denies any new trauma to the area.  She is on Lovenox.  Past Medical History:  Diagnosis Date  . 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 Allergic reaction 07/24/2016  . Anemia    needed transfusion spring of 2018  . Arthritis KNEES   right knee, hx. past left knee replacemnt.  . Blood clot in vein    around Portacath right chest-tx. Eliquis about a yr., prior Lovenox.  . Blood dyscrasia   . Chronic anticoagulation HX  PORT-A-CATH CLOT   2010 - HAS BEEN ON LOVENOX SINCE THE CLOT  . Drug rash 07/24/2016  . Elevated blood pressure reading without diagnosis of hypertension    PT MONITORS HER B/P AT HOME  . Heart murmur   . Hematoma of leg, right, sequela   . 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 PONV (postoperative nausea and vomiting)    ponv likes scopolamine patch  . Port-A-Cath in place    RIGHT CHEST   . Skin cancer of anterior chest BREAST CANCER PRIMARY W/ METS TO CHEST WALL SKIN CANCER--  CHEMOTHERAPY EVERY 3 WEEKS AT [facility]   left 9 yrs ago, chemo every 3 weeks at [place], last chemo july 3rd  . Skin changes related to chemotherapy    per patient she has scabbed over skin circumventing port to r ight upper chest ; denie drainage and state it is due to chemptherapy   . Status post skin flap graft    right chest wall with metastatis right anterior chest -no drainage or open wound.    Patient Active Problem List   Diagnosis Date Noted  . Fever   . Anemia due to chemotherapy 05/07/2017  . Abnormal LFTs 05/07/2017  . History of breast cancer   . Bilateral leg edema   . Enterococcal infection   . S/P revision of total knee, right 04/27/2017  . Pyogenic arthritis of right knee joint ([place])   . Acute blood loss anemia 12/12/2016  . Right knee abx spacer 12/02/2016  . Physical exam 07/31/2016  . Allergic reaction 07/24/2016  . Drug rash 07/24/2016  . S/P total knee arthroplasty, right 05/31/2016  . 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  Nonhealing surgical wound 05/28/2016  . Sepsis ([place]) 05/10/2016  . UTI (urinary tract infection) 05/10/2016  . Cellulitis of right knee 05/10/2016  . Blood clot in vein   . Wound dehiscence, surgical 04/12/2016  . 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 Vitamin D deficiency 01/25/2016  . Metastatic cancer to chest wall ([place]) 02/02/2015  . Primary osteoarthritis of right knee 02/01/2015  . Symptomatic anemia 02/01/2015  . Frequent UTI 01/12/2014  . Sinus tachycardia 11/03/2013  . SOB (shortness of breath) 11/03/2013  . Postoperative anemia due to acute blood loss 12/24/2012  .    09/23/2012  . Sinusitis 07/28/2012  . 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 Metastatic breast cancer ([place]) 11/12/2010  . Chronic anticoagulation 11/12/2010  . Anxiety, generalized 03/26/2008    Past Surgical History:  Procedure Laterality Date  . APPLICATION OF A-CELL OF BACK Right 07/31/2016   Procedure: CELLERATE COLLAGEN PLACEMENT;  Surgeon: Loel Lofty Dillingham, DO;  Location: [place];  Service: Plastics;  Laterality: Right;  . APPLICATION OF WOUND VAC Right 06/29/2017   Procedure: APPLICATION OF WOUND VAC;  Surgeon: Wallace Going, DO;  Location: [facility];  Service: Plastics;  Laterality: Right;  . CHEST WALL TUMOR EXCISION     right  . EXCISIONAL TOTAL KNEE ARTHROPLASTY WITH ANTIBIOTIC SPACERS Right 12/02/2016   Procedure: EXCISIONAL TOTAL KNEE ARTHROPLASTY WITH ANTIBIOTIC SPACERS;  Surgeon: Paralee Cancel, MD;  [facility];  Service: Orthopedics;  Laterality: Right;  90 mins  . HEMATOMA EVACUATION Right 06/29/2017   Procedure: EVACUATION HEMATOMA OF RIGHT KNEE;  Surgeon: Wallace Going, DO;  [facility];  Service: Plastics;  Laterality: Right;  . HEMATOMA EVACUATION Right 06/29/2017   Procedure: EVACUATION RIGHT TOTAL KNEE HEMATOMA;  Surgeon: Paralee Cancel, MD;  [facility];  Service: Orthopedics;  Laterality: Right;  . hemotoma     evacuation left chest wall  . I&D KNEE WITH POLY EXCHANGE Right 05/28/2016   Procedure: IRRIGATION AND DEBRIDEMENT KNEE WOUND VAC PLACMENT;  Surgeon: Susa Day, MD;  [facility];  Service: Orthopedics;  Laterality: Right;  . I&D KNEE WITH POLY EXCHANGE Right 05/30/2016   Procedure: RADICAL SYNOVECTOMY,IRRIGATION AND DEBRIDEMENT KNEE WITH POLY EXCHANGE WITH ANTIBIOTIC BEADS, APPLICATION OF WOUND VAC;  Surgeon: Susa Day, MD;  [facility];  Service: Orthopedics;  Laterality: Right;  . INCISION AND DRAINAGE OF WOUND Right 07/31/2016   Procedure: IRRIGATION AND DEBRIDEMENT RIGHT KNEE  WOUND;  Surgeon: Loel Lofty Dillingham, DO;  Location: [place];  Service: Plastics;   Laterality: Right;  . IR FLUORO GUIDE CV LINE LEFT  04/30/2017  . IR US GUIDE VASC ACCESS LEFT  04/30/2017  . IRRIGATION AND DEBRIDEMENT KNEE Right 04/12/2016   Procedure: IRRIGATION AND DEBRIDEMENT KNEE;  Surgeon: Nicholes Stairs, MD;  [facility];  Service: Orthopedics;  Laterality: Right;  . IRRIGATION AND DEBRIDEMENT KNEE Right 12/16/2016   Procedure: Repeat irrigation and debridement right knee, wound closure  wound vac REPLACEMENT ANTIBIOTIC SPACERS;  Surgeon: Paralee Cancel, MD;  [facility];  Service: Orthopedics;  Laterality: Right;  . KNEE ARTHROSCOPY  02/13/2012   Procedure: ARTHROSCOPY KNEE;  Surgeon: Johnn Hai, MD;  Location: [facility];  Service: Orthopedics;  Laterality: Left;  WITH DEBRIDEMENt   . KNEE ARTHROSCOPY WITH LATERAL MENISECTOMY  02/13/2012   Procedure: KNEE ARTHROSCOPY WITH LATERAL MENISECTOMY;  Surgeon: Johnn Hai, MD;  Location: [place];  Service: Orthopedics;;  partial  . LEFT MODIFIED RADICAL MASTECTOMY/ RIGHT TOTAL MASTECTOMY  11-13-2007   LEFT BREAST CANCER W/ AXILLARY LYMPH NODE METASTASIS AND POST NEOADJUVANT CHEMO  . MASS EXCISION Right 06/29/2017   Procedure: EXCISION OF METASTATIC BREAST CANCER TO SKIN RIGHT SHOULDER, PLACEMENT OF CELLERATE;  Surgeon: Wallace Going, DO;  [facility];  Service: Plastics;  Laterality: Right;  . MASTECTOMY     Bilateral  . PLACEMENT PORT-A-CATH  06/22/2007   right chest new port placed 2015, right chest  . REIMPLANTATION OF TOTAL KNEE Right 04/27/2017   Procedure: Reimplantation of right total knee arthroplasty;  Surgeon: Paralee Cancel, MD;  [facility];  Service: Orthopedics;  Laterality: Right;  Adductor Block  . SKIN GRAFT     chest-post tumor removal, second occurrence of cancer" 2015 surgery for Flap 2015"  . SKIN SPLIT GRAFT Right 01/29/2017   Procedure: SKIN GRAFT SPLIT THICKNESS TO RIGHT KNEE WOUND;  Surgeon: Wallace Going, DO;  [facility];  Service: Plastics;  Laterality: Right;  . TOTAL KNEE ARTHROPLASTY Left 09/23/2012   Procedure: LEFT TOTAL KNEE ARTHROPLASTY;  Surgeon: Johnn Hai, MD;  [facility];  Service: Orthopedics;  Laterality: Left;  . TOTAL KNEE ARTHROPLASTY Right 03/20/2016   Procedure: RIGHT TOTAL KNEE ARTHROPLASTY;  Surgeon: Susa Day, MD;  [facility];  Service: Orthopedics;  Laterality: Right;  . TRANSTHORACIC ECHOCARDIOGRAM  11-18-2011  DR BENSIMHON (ECHO EVERY 3 MONTHS)   HX CHEMO INDUCED CARDIOTOXICITY/  LVSF NORMAL/ EF 55-50%/ MILD MITRIAL VALVE REGURG./ MILDLY INCREASED SYSTOLIC PRESSURE OF PULMONARY ARTERIES  . VASCULAR SURGERY Right    portacath chest    OB History    No data available       Home Medications    Prior to Admission medications   Medication Sig Start Date End Date Taking? Authorizing Provider  acetaminophen (TYLENOL) 500 MG tablet Take 500 mg by mouth every 8 (eight) hours as needed for mild pain or headache.    [provider]  Ado-Trastuzumab Emtansine (KADCYLA IV) Inject into the vein every 21 ( twenty-one) days. At [place] 2019    [provider]  celecoxib (CELEBREX) 200 MG capsule Take 1 capsule (200 mg total) by mouth every 12 (twelve) hours. Patient not taking: Reported on 05/01/2017 04/30/17   Paralee Cancel, MD  cetirizine (ZYRTEC) 10 MG tablet Take 1 tablet (10 mg total) daily by mouth. 04/03/17   Midge Minium, MD  Cholecalciferol (VITAMIN D3) 2000 UNITS TABS Take 2,000 Units by mouth 4 (four) times a week.     [provider]  darifenacin (ENABLEX) 15 MG 24 hr tablet Take 15 mg by mouth daily. 05/04/17   [provider]  docusate sodium (COLACE) 100 MG capsule Take 1 capsule (100 mg total) by mouth 2 (two) times daily. 04/27/17   Danae Orleans, PA-C  doxycycline (VIBRA-TABS) 100 MG tablet Take 100 mg by mouth 2 (two) times daily. 6 month course started 05-2017    [provider]    enoxaparin (LOVENOX) 80 MG/0.8ML injection Inject 0.4 mLs (40 mg total) into the skin daily. Patient taking differently: Inject 70 mg into the skin daily.  04/30/17   Paralee Cancel, MD  ferrous sulfate (FERROUSUL) 325 (65 FE) MG tablet Take 1 tablet (325 mg total) by mouth 3 (three) times daily with meals. 05/10/17   Eugenie Filler, MD  furosemide (LASIX) 20 MG tablet Take 1 tablet (20 mg total) by mouth daily  as needed for edema. Patient taking differently: Take 20 mg by mouth daily as needed for edema.  04/30/17   Paralee Cancel, MD  gabapentin (NEURONTIN) 300 MG capsule TAKE 1 CAPSULE BY MOUTH 3 TIMES DAILY Patient taking differently: TAKE 1 CAPSULE BY MOUTH 4 TIMES DAILY FOR 30 DAYS 02/02/17   Midge Minium, MD  methocarbamol (ROBAXIN) 500 MG tablet Take 1 tablet (500 mg total) by mouth every 6 (six) hours as needed for muscle spasms. Patient not taking: Reported on 06/24/2017 04/27/17   Danae Orleans, PA-C  Multiple Vitamin (MULTIVITAMIN WITH MINERALS) TABS tablet Take 1 tablet by mouth daily.    [provider]  ondansetron (ZOFRAN) 8 MG tablet Take 1 tablet (8 mg total) by mouth every 8 (eight) hours as needed for nausea. Patient taking differently: Take 8 mg by mouth every 8 (eight) hours as needed for nausea or vomiting. Uses after chemo treatments 05/23/16   Midge Minium, MD  oxyCODONE (OXY IR/ROXICODONE) 5 MG immediate release tablet Take 1-3 tablets (5-15 mg total) by mouth every 4 (four) hours as needed for severe pain. Patient taking differently: Take 5 mg by mouth every 6 (six) hours as needed for severe pain.  04/27/17   Danae Orleans, PA-C  polyethylene glycol (MIRALAX / GLYCOLAX) packet Take 17 g by mouth 2 (two) times daily. Patient taking differently: Take 17 g by mouth daily as needed for mild constipation.  04/27/17   Danae Orleans, PA-C  zolpidem (AMBIEN) 5 MG tablet TAKE 1 TABLET BY MOUTH ONCE DAILY AT BEDTIME AS NEEDED FOR SLEEP 10/02/16   Midge Minium, MD    Family History Family History  Problem Relation Age of Onset  . Heart disease Mother        due to mitral valve regurgiation,   . Cancer Mother        breast  . Allergic rhinitis Mother   . Parkinsonism Father   . Allergic rhinitis Father   . Migraines Father   . Alcohol abuse Paternal Grandfather   . Angioedema Neg Hx   . Asthma Neg Hx   . Eczema Neg Hx     Social History Social History   Tobacco Use  . Smoking status: Never Smoker  . Smokeless tobacco: Never Used  Substance Use Topics  . Alcohol use: No  . Drug use: No     Allergies   Penicillins; Other; and Tape   Review of Systems Review of Systems  Constitutional: Negative for chills and fever.  HENT: Negative for congestion, rhinorrhea and sore throat.   Eyes: Negative for visual disturbance.  Respiratory: Negative for cough, shortness of breath and wheezing.   Cardiovascular: Negative for chest pain and leg swelling.  Gastrointestinal: Negative for abdominal pain, diarrhea, nausea and vomiting.  Genitourinary: Negative for dysuria, flank pain, vaginal bleeding and vaginal discharge.  Musculoskeletal: Negative for neck pain.  Skin: Positive for wound. Negative for rash.  Allergic/Immunologic: Negative for immunocompromised state.  Neurological: Negative for syncope and headaches.  Hematological: Does not bruise/bleed easily.  All other systems reviewed and are negative.    Physical Exam Updated Vital Signs BP 126/75 (BP Location: Left Arm)   Pulse (!) 109   Temp 98.4 F (36.9 C) (Oral)   Resp 20   SpO2 97%   Physical Exam  Constitutional: She is oriented to person, place, and time. She appears well-developed and well-nourished. No distress.  HENT:  Head: Normocephalic and atraumatic.  Eyes: Conjunctivae are normal.  Neck: Neck supple.  Cardiovascular: Normal rate, regular rhythm and normal heart sounds. Exam reveals no friction rub.  No murmur heard. Pulmonary/Chest:  Effort normal and breath sounds normal. No respiratory distress. She has no wheezes. She has no rales.  Chronic radiation changes to the thorax and back.    Abdominal: She exhibits no distension.  Musculoskeletal: She exhibits no edema.  Neurological: She is alert and oriented to person, place, and time. She exhibits normal muscle tone.  Skin: Skin is warm. Capillary refill takes less than 2 seconds.  Psychiatric: She has a normal mood and affect.  Nursing note and vitals reviewed.    ED Treatments / Results  Labs (all labs ordered are listed, but only abnormal results are displayed) Labs Reviewed  CBC - Abnormal; Notable for the following components:      Result Value   RBC 3.64 (*)    Hemoglobin 11.9 (*)    RDW 17.0 (*)    All other components within normal limits  BASIC METABOLIC PANEL - Abnormal; Notable for the following components:   Glucose, Bld 109 (*)    All other components within normal limits  PROTIME-INR    EKG  EKG Interpretation None       Radiology No results found.  Procedures .Marland KitchenLaceration Repair Date/Time: 07/06/2017 7:57 PM Performed by: Duffy Bruce, MD Authorized by: Duffy Bruce, MD   Consent:    Consent obtained:  Verbal   Consent given by:  Patient   Risks discussed:  Infection, need for additional repair, pain, tendon damage, retained foreign body, vascular damage, poor cosmetic result, poor wound healing and nerve damage   Alternatives discussed:  Referral and delayed treatment Anesthesia (see MAR for exact dosages):    Anesthesia method:  Local infiltration   Local anesthetic:  Lidocaine 1% WITH epi Laceration details:    Location: Right upper chest wall.   Length (cm):  6   Depth (mm):  2 Repair type:    Repair type:  Intermediate Pre-procedure details:    Preparation:  Patient was prepped and draped in usual sterile fashion and imaging obtained to evaluate for foreign bodies Exploration:    Hemostasis achieved with:  Tied  off vessels   Wound exploration: wound explored through full range of motion and entire depth of wound probed and visualized   Treatment:    Area cleansed with:  Shur-Clens   Amount of cleaning:  Standard   Irrigation solution:  Sterile saline   Irrigation method:  Pressure wash Mucous membrane repair:    Suture size:  4-0   Suture material:  Vicryl   Suture technique:  Simple interrupted Skin repair:    Repair method:  Sutures Approximation:    Approximation:  Loose Post-procedure details:    Dressing:  Antibiotic ointment and non-adherent dressing   Patient tolerance of procedure:  Tolerated well, no immediate complications    (including critical care time)  Medications Ordered in ED Medications  lidocaine-EPINEPHrine (XYLOCAINE W/EPI) 2 %-1:200000 (PF) injection 20 mL (20 mLs Other Not Given 07/06/17 1837)  lidocaine-EPINEPHrine (XYLOCAINE W/EPI) 2 %-1:200000 (PF) injection (  Given by Other 07/06/17 1837)     Initial Impression / Assessment and Plan / ED Course  I have reviewed the triage vital signs and the nursing notes.  Pertinent labs & imaging results that were available during my care of the patient were reviewed by me and considered in my medical decision making (see chart for details).     62 year old female with  extensive past medical history as above here with bleeding from recent biopsy site.  Patient is on Lovenox.  On arrival, patient is active, pulsatile bleeding from likely superficial dermal capillary.  Following anesthesia and cleaning with saline and chlorhexidine, I was ultimately able to obtain hemostasis with multiple figure-of-eight sutures.  The patient was watched for several hours after closure with excellent hemostasis.  Hemoglobin is stable.  She has no ongoing bleeding.  Will have her follow-up with her plastic surgeon and continue local wound care.  Final Clinical Impressions(s) / ED Diagnoses   Final diagnoses:  Bleeding from wound  Chronic  anemia    ED Discharge Orders    None       Duffy Bruce, MD 07/06/17 2103    Duffy Bruce, MD 07/20/17 1113

## 2017-07-06 NOTE — ED Triage Notes (Signed)
Last Monday patient had lesions removed from right breast (hx of breast CA). Patient takes prescribed Lovenox. Pt noticed around 1500 today extensive bleeding from site. Was seen today for follow up appointment but symptoms started after.

## 2017-07-06 NOTE — Discharge Instructions (Signed)
Continue using Xeroform once daily as instructed  In the event of a re-bleed, apply firm, direct pressure using 1-2 fingers on gauze, for 10 minutes.

## 2017-07-06 NOTE — ED Notes (Signed)
ED Provider at bedside. 

## 2017-07-07 DIAGNOSIS — Z5112 Encounter for antineoplastic immunotherapy: Secondary | ICD-10-CM | POA: Diagnosis not present

## 2017-07-07 DIAGNOSIS — C792 Secondary malignant neoplasm of skin: Secondary | ICD-10-CM | POA: Diagnosis not present

## 2017-07-07 DIAGNOSIS — C50412 Malignant neoplasm of upper-outer quadrant of left female breast: Secondary | ICD-10-CM | POA: Diagnosis not present

## 2017-07-08 DIAGNOSIS — Z96651 Presence of right artificial knee joint: Secondary | ICD-10-CM | POA: Diagnosis not present

## 2017-07-13 ENCOUNTER — Ambulatory Visit: Payer: 59

## 2017-07-13 ENCOUNTER — Encounter (HOSPITAL_COMMUNITY): Payer: Self-pay | Admitting: *Deleted

## 2017-07-13 ENCOUNTER — Other Ambulatory Visit: Payer: Self-pay

## 2017-07-14 ENCOUNTER — Encounter (HOSPITAL_COMMUNITY)
Admission: AD | Disposition: A | Discharge status: Home / Self Care | Payer: Self-pay | Source: Ambulatory Visit | Attending: Orthopedic Surgery

## 2017-07-14 ENCOUNTER — Ambulatory Visit (HOSPITAL_COMMUNITY): Payer: 59 | Admitting: Anesthesiology

## 2017-07-14 ENCOUNTER — Other Ambulatory Visit: Payer: Self-pay

## 2017-07-14 ENCOUNTER — Inpatient Hospital Stay (HOSPITAL_COMMUNITY)
Admission: AD | Admit: 2017-07-14 | Discharge: 2017-07-18 | DRG: 908 | Disposition: A | Discharge status: Home / Self Care | Payer: 59 | Source: Ambulatory Visit | Attending: Orthopedic Surgery | Admitting: Orthopedic Surgery

## 2017-07-14 ENCOUNTER — Encounter (HOSPITAL_COMMUNITY): Payer: Self-pay

## 2017-07-14 DIAGNOSIS — Z79899 Other long term (current) drug therapy: Secondary | ICD-10-CM | POA: Diagnosis not present

## 2017-07-14 DIAGNOSIS — M2500 Hemarthrosis, unspecified joint: Secondary | ICD-10-CM | POA: Diagnosis present

## 2017-07-14 DIAGNOSIS — C792 Secondary malignant neoplasm of skin: Secondary | ICD-10-CM | POA: Diagnosis not present

## 2017-07-14 DIAGNOSIS — Z880 Allergy status to penicillin: Secondary | ICD-10-CM | POA: Diagnosis not present

## 2017-07-14 DIAGNOSIS — Z90710 Acquired absence of both cervix and uterus: Secondary | ICD-10-CM

## 2017-07-14 DIAGNOSIS — M96840 Postprocedural hematoma of a musculoskeletal structure following a musculoskeletal system procedure: Secondary | ICD-10-CM | POA: Diagnosis not present

## 2017-07-14 DIAGNOSIS — Z96653 Presence of artificial knee joint, bilateral: Secondary | ICD-10-CM | POA: Diagnosis present

## 2017-07-14 DIAGNOSIS — Z9221 Personal history of antineoplastic chemotherapy: Secondary | ICD-10-CM | POA: Diagnosis not present

## 2017-07-14 DIAGNOSIS — T8189XA Other complications of procedures, not elsewhere classified, initial encounter: Secondary | ICD-10-CM | POA: Diagnosis not present

## 2017-07-14 DIAGNOSIS — C50911 Malignant neoplasm of unspecified site of right female breast: Secondary | ICD-10-CM | POA: Diagnosis not present

## 2017-07-14 DIAGNOSIS — R011 Cardiac murmur, unspecified: Secondary | ICD-10-CM | POA: Diagnosis present

## 2017-07-14 DIAGNOSIS — M1711 Unilateral primary osteoarthritis, right knee: Secondary | ICD-10-CM | POA: Diagnosis not present

## 2017-07-14 DIAGNOSIS — Z91048 Other nonmedicinal substance allergy status: Secondary | ICD-10-CM | POA: Diagnosis not present

## 2017-07-14 DIAGNOSIS — T148XXA Other injury of unspecified body region, initial encounter: Secondary | ICD-10-CM | POA: Diagnosis present

## 2017-07-14 DIAGNOSIS — D649 Anemia, unspecified: Secondary | ICD-10-CM | POA: Diagnosis present

## 2017-07-14 DIAGNOSIS — N390 Urinary tract infection, site not specified: Secondary | ICD-10-CM | POA: Diagnosis not present

## 2017-07-14 DIAGNOSIS — Z853 Personal history of malignant neoplasm of breast: Secondary | ICD-10-CM | POA: Diagnosis not present

## 2017-07-14 DIAGNOSIS — T8131XD Disruption of external operation (surgical) wound, not elsewhere classified, subsequent encounter: Secondary | ICD-10-CM | POA: Diagnosis not present

## 2017-07-14 DIAGNOSIS — Z96651 Presence of right artificial knee joint: Secondary | ICD-10-CM | POA: Diagnosis not present

## 2017-07-14 DIAGNOSIS — Z85828 Personal history of other malignant neoplasm of skin: Secondary | ICD-10-CM

## 2017-07-14 DIAGNOSIS — S8002XA Contusion of left knee, initial encounter: Secondary | ICD-10-CM | POA: Diagnosis not present

## 2017-07-14 DIAGNOSIS — S8011XA Contusion of right lower leg, initial encounter: Secondary | ICD-10-CM | POA: Diagnosis not present

## 2017-07-14 DIAGNOSIS — L03115 Cellulitis of right lower limb: Secondary | ICD-10-CM | POA: Diagnosis not present

## 2017-07-14 DIAGNOSIS — A419 Sepsis, unspecified organism: Secondary | ICD-10-CM | POA: Diagnosis not present

## 2017-07-14 DIAGNOSIS — M25461 Effusion, right knee: Secondary | ICD-10-CM | POA: Diagnosis not present

## 2017-07-14 HISTORY — PX: HEMATOMA EVACUATION: SHX5118

## 2017-07-14 LAB — PROTIME-INR
INR: 0.98
Prothrombin Time: 12.9 seconds (ref 11.4–15.2)

## 2017-07-14 SURGERY — EVACUATION HEMATOMA
Anesthesia: General | Site: Knee | Laterality: Right

## 2017-07-14 MED ORDER — MIDAZOLAM HCL 5 MG/5ML IJ SOLN
INTRAMUSCULAR | Status: DC | PRN
  Administered 2017-07-14: 2 mg via INTRAVENOUS

## 2017-07-14 MED ORDER — DEXAMETHASONE SODIUM PHOSPHATE 10 MG/ML IJ SOLN
10.0000 mg | Freq: Once | INTRAMUSCULAR | Status: AC
  Administered 2017-07-14: 10 mg via INTRAVENOUS

## 2017-07-14 MED ORDER — DOCUSATE SODIUM 100 MG PO CAPS
100.0000 mg | ORAL_CAPSULE | Freq: Two times a day (BID) | ORAL | Status: DC
  Administered 2017-07-14 – 2017-07-18 (×8): 100 mg via ORAL
  Filled 2017-07-14 (×9): qty 1

## 2017-07-14 MED ORDER — SODIUM CHLORIDE 0.9 % IV SOLN
INTRAVENOUS | Status: DC
  Administered 2017-07-14 – 2017-07-16 (×5): via INTRAVENOUS

## 2017-07-14 MED ORDER — METHOCARBAMOL 500 MG PO TABS
500.0000 mg | ORAL_TABLET | Freq: Four times a day (QID) | ORAL | Status: DC | PRN
  Administered 2017-07-17: 500 mg via ORAL
  Filled 2017-07-14: qty 1

## 2017-07-14 MED ORDER — MAGNESIUM CITRATE PO SOLN: 1.0000 | Freq: Once | ORAL | Status: DC | PRN

## 2017-07-14 MED ORDER — LIDOCAINE 2% (20 MG/ML) 5 ML SYRINGE
INTRAMUSCULAR | Status: AC
  Filled 2017-07-14: qty 5

## 2017-07-14 MED ORDER — METOCLOPRAMIDE HCL 5 MG/ML IJ SOLN: 5.0000 mg | Freq: Three times a day (TID) | INTRAMUSCULAR | Status: DC | PRN

## 2017-07-14 MED ORDER — ACETAMINOPHEN 325 MG PO TABS: 650.0000 mg | ORAL_TABLET | ORAL | Status: DC | PRN

## 2017-07-14 MED ORDER — VANCOMYCIN HCL IN DEXTROSE 1-5 GM/200ML-% IV SOLN
1000.0000 mg | Freq: Two times a day (BID) | INTRAVENOUS | Status: AC
  Administered 2017-07-15: 1000 mg via INTRAVENOUS
  Filled 2017-07-14: qty 200

## 2017-07-14 MED ORDER — ACETAMINOPHEN 650 MG RE SUPP: 650.0000 mg | RECTAL | Status: DC | PRN

## 2017-07-14 MED ORDER — OXYCODONE HCL 5 MG PO TABS: 5.0000 mg | ORAL_TABLET | Freq: Once | ORAL | Status: DC | PRN

## 2017-07-14 MED ORDER — OXYCODONE HCL 5 MG PO TABS
ORAL_TABLET | ORAL | Status: AC
  Filled 2017-07-14: qty 1

## 2017-07-14 MED ORDER — BISACODYL 10 MG RE SUPP: 10.0000 mg | Freq: Every day | RECTAL | Status: DC | PRN

## 2017-07-14 MED ORDER — SCOPOLAMINE 1 MG/3DAYS TD PT72
MEDICATED_PATCH | TRANSDERMAL | Status: DC | PRN
  Administered 2017-07-14: 1 via TRANSDERMAL

## 2017-07-14 MED ORDER — ONDANSETRON HCL 4 MG/2ML IJ SOLN
INTRAMUSCULAR | Status: AC
  Filled 2017-07-14: qty 2

## 2017-07-14 MED ORDER — ENOXAPARIN SODIUM 40 MG/0.4ML ~~LOC~~ SOLN: 40.0000 mg | Freq: Once | SUBCUTANEOUS | Status: DC

## 2017-07-14 MED ORDER — VANCOMYCIN HCL IN DEXTROSE 1-5 GM/200ML-% IV SOLN
INTRAVENOUS | Status: AC
  Filled 2017-07-14: qty 200

## 2017-07-14 MED ORDER — GABAPENTIN 300 MG PO CAPS
300.0000 mg | ORAL_CAPSULE | Freq: Every day | ORAL | Status: DC
  Administered 2017-07-14 – 2017-07-18 (×16): 300 mg via ORAL
  Filled 2017-07-14 (×17): qty 1

## 2017-07-14 MED ORDER — ONDANSETRON HCL 4 MG/2ML IJ SOLN
INTRAMUSCULAR | Status: DC | PRN
  Administered 2017-07-14: 4 mg via INTRAVENOUS

## 2017-07-14 MED ORDER — PHENOL 1.4 % MT LIQD
1.0000 | OROMUCOSAL | Status: DC | PRN
  Filled 2017-07-14: qty 177

## 2017-07-14 MED ORDER — POLYETHYLENE GLYCOL 3350 17 G PO PACK
17.0000 g | PACK | Freq: Two times a day (BID) | ORAL | Status: DC
  Administered 2017-07-15 – 2017-07-18 (×5): 17 g via ORAL
  Filled 2017-07-14 (×8): qty 1

## 2017-07-14 MED ORDER — DEXAMETHASONE SODIUM PHOSPHATE 10 MG/ML IJ SOLN
10.0000 mg | Freq: Once | INTRAMUSCULAR | Status: AC
  Administered 2017-07-15: 10 mg via INTRAVENOUS
  Filled 2017-07-14: qty 1

## 2017-07-14 MED ORDER — DOXYCYCLINE HYCLATE 100 MG PO TABS
100.0000 mg | ORAL_TABLET | Freq: Two times a day (BID) | ORAL | Status: DC
  Administered 2017-07-14 – 2017-07-18 (×8): 100 mg via ORAL
  Filled 2017-07-14 (×8): qty 1

## 2017-07-14 MED ORDER — METHOCARBAMOL 1000 MG/10ML IJ SOLN
500.0000 mg | Freq: Four times a day (QID) | INTRAMUSCULAR | Status: DC | PRN
  Administered 2017-07-14: 500 mg via INTRAVENOUS
  Filled 2017-07-14: qty 550

## 2017-07-14 MED ORDER — MENTHOL 3 MG MT LOZG: 1.0000 | LOZENGE | OROMUCOSAL | Status: DC | PRN

## 2017-07-14 MED ORDER — METOCLOPRAMIDE HCL 5 MG PO TABS: 5.0000 mg | ORAL_TABLET | Freq: Three times a day (TID) | ORAL | Status: DC | PRN

## 2017-07-14 MED ORDER — OXYCODONE HCL 5 MG PO TABS
10.0000 mg | ORAL_TABLET | ORAL | Status: DC | PRN
  Administered 2017-07-14: 10 mg via ORAL
  Filled 2017-07-14 (×2): qty 2

## 2017-07-14 MED ORDER — LACTATED RINGERS IV SOLN
INTRAVENOUS | Status: DC
  Administered 2017-07-14: 1000 mL via INTRAVENOUS

## 2017-07-14 MED ORDER — ACETAMINOPHEN 160 MG/5ML PO SOLN: 325.0000 mg | ORAL | Status: DC | PRN

## 2017-07-14 MED ORDER — SCOPOLAMINE 1 MG/3DAYS TD PT72
MEDICATED_PATCH | TRANSDERMAL | Status: AC
  Filled 2017-07-14: qty 1

## 2017-07-14 MED ORDER — SODIUM CHLORIDE 0.9 % IR SOLN
Status: DC | PRN
  Administered 2017-07-14: 1000 mL

## 2017-07-14 MED ORDER — FENTANYL CITRATE (PF) 250 MCG/5ML IJ SOLN
INTRAMUSCULAR | Status: AC
  Filled 2017-07-14: qty 5

## 2017-07-14 MED ORDER — OXYCODONE HCL 5 MG/5ML PO SOLN
5.0000 mg | Freq: Once | ORAL | Status: DC | PRN
  Filled 2017-07-14: qty 5

## 2017-07-14 MED ORDER — HYDROMORPHONE HCL 1 MG/ML IJ SOLN
0.5000 mg | INTRAMUSCULAR | Status: DC | PRN
  Administered 2017-07-18: 2 mg via INTRAVENOUS
  Filled 2017-07-14: qty 2

## 2017-07-14 MED ORDER — GABAPENTIN 300 MG PO CAPS
ORAL_CAPSULE | ORAL | Status: AC
  Filled 2017-07-14: qty 1

## 2017-07-14 MED ORDER — VANCOMYCIN HCL IN DEXTROSE 1-5 GM/200ML-% IV SOLN
1000.0000 mg | INTRAVENOUS | Status: AC
  Administered 2017-07-14: 1000 mg via INTRAVENOUS

## 2017-07-14 MED ORDER — OXYCODONE HCL 5 MG PO TABS
5.0000 mg | ORAL_TABLET | ORAL | Status: DC | PRN
  Administered 2017-07-14 – 2017-07-18 (×6): 5 mg via ORAL
  Filled 2017-07-14 (×4): qty 1

## 2017-07-14 MED ORDER — PROPOFOL 10 MG/ML IV BOLUS
INTRAVENOUS | Status: DC | PRN
  Administered 2017-07-14: 150 mg via INTRAVENOUS

## 2017-07-14 MED ORDER — ONDANSETRON HCL 4 MG PO TABS: 4.0000 mg | ORAL_TABLET | Freq: Three times a day (TID) | ORAL | Status: DC | PRN

## 2017-07-14 MED ORDER — DEXAMETHASONE SODIUM PHOSPHATE 10 MG/ML IJ SOLN
INTRAMUSCULAR | Status: AC
  Filled 2017-07-14: qty 1

## 2017-07-14 MED ORDER — ZOLPIDEM TARTRATE 5 MG PO TABS: 5.0000 mg | ORAL_TABLET | Freq: Every evening | ORAL | Status: DC | PRN

## 2017-07-14 MED ORDER — FENTANYL CITRATE (PF) 100 MCG/2ML IJ SOLN
INTRAMUSCULAR | Status: AC
  Filled 2017-07-14: qty 2

## 2017-07-14 MED ORDER — LIDOCAINE HCL (CARDIAC) 20 MG/ML IV SOLN
INTRAVENOUS | Status: DC | PRN
  Administered 2017-07-14: 60 mg via INTRAVENOUS

## 2017-07-14 MED ORDER — CHLORHEXIDINE GLUCONATE 4 % EX LIQD: 60.0000 mL | Freq: Once | CUTANEOUS | Status: DC

## 2017-07-14 MED ORDER — FERROUS SULFATE 325 (65 FE) MG PO TABS
325.0000 mg | ORAL_TABLET | Freq: Three times a day (TID) | ORAL | Status: DC
  Administered 2017-07-15 – 2017-07-18 (×6): 325 mg via ORAL
  Filled 2017-07-14 (×9): qty 1

## 2017-07-14 MED ORDER — CELECOXIB 200 MG PO CAPS
200.0000 mg | ORAL_CAPSULE | Freq: Two times a day (BID) | ORAL | Status: DC
  Administered 2017-07-14 – 2017-07-18 (×8): 200 mg via ORAL
  Filled 2017-07-14 (×8): qty 1

## 2017-07-14 MED ORDER — DIPHENHYDRAMINE HCL 12.5 MG/5ML PO ELIX: 12.5000 mg | ORAL_SOLUTION | ORAL | Status: DC | PRN

## 2017-07-14 MED ORDER — 0.9 % SODIUM CHLORIDE (POUR BTL) OPTIME
TOPICAL | Status: DC | PRN
  Administered 2017-07-14: 1000 mL

## 2017-07-14 MED ORDER — DARIFENACIN HYDROBROMIDE ER 15 MG PO TB24
15.0000 mg | ORAL_TABLET | Freq: Every day | ORAL | Status: DC
  Administered 2017-07-15 – 2017-07-18 (×4): 15 mg via ORAL
  Filled 2017-07-14 (×4): qty 1

## 2017-07-14 MED ORDER — ACETAMINOPHEN 325 MG PO TABS: 325.0000 mg | ORAL_TABLET | ORAL | Status: DC | PRN

## 2017-07-14 MED ORDER — FENTANYL CITRATE (PF) 100 MCG/2ML IJ SOLN
INTRAMUSCULAR | Status: DC | PRN
  Administered 2017-07-14: 75 ug via INTRAVENOUS
  Administered 2017-07-14: 25 ug via INTRAVENOUS
  Administered 2017-07-14: 50 ug via INTRAVENOUS

## 2017-07-14 MED ORDER — ONDANSETRON HCL 4 MG/2ML IJ SOLN: 4.0000 mg | Freq: Three times a day (TID) | INTRAMUSCULAR | Status: DC | PRN

## 2017-07-14 MED ORDER — LORATADINE 10 MG PO TABS
10.0000 mg | ORAL_TABLET | Freq: Every day | ORAL | Status: DC
  Administered 2017-07-15 – 2017-07-18 (×4): 10 mg via ORAL
  Filled 2017-07-14 (×4): qty 1

## 2017-07-14 MED ORDER — ALUM & MAG HYDROXIDE-SIMETH 200-200-20 MG/5ML PO SUSP: 15.0000 mL | ORAL | Status: DC | PRN

## 2017-07-14 MED ORDER — FENTANYL CITRATE (PF) 100 MCG/2ML IJ SOLN
25.0000 ug | INTRAMUSCULAR | Status: DC | PRN
  Administered 2017-07-14: 25 ug via INTRAVENOUS

## 2017-07-14 MED ORDER — MIDAZOLAM HCL 2 MG/2ML IJ SOLN
INTRAMUSCULAR | Status: AC
  Filled 2017-07-14: qty 2

## 2017-07-14 MED ORDER — PROPOFOL 10 MG/ML IV BOLUS
INTRAVENOUS | Status: AC
  Filled 2017-07-14: qty 40

## 2017-07-14 SURGICAL SUPPLY — 36 items
BAG SPEC THK2 15X12 ZIP CLS (MISCELLANEOUS) ×1
BAG ZIPLOCK 12X15 (MISCELLANEOUS) ×2 IMPLANT
BANDAGE ACE 6X5 VEL STRL LF (GAUZE/BANDAGES/DRESSINGS) ×1 IMPLANT
BANDAGE ESMARK 6X9 LF (GAUZE/BANDAGES/DRESSINGS) ×1 IMPLANT
BNDG CMPR 9X6 STRL LF SNTH (GAUZE/BANDAGES/DRESSINGS) ×1
BNDG ESMARK 6X9 LF (GAUZE/BANDAGES/DRESSINGS) ×2
COVER SURGICAL LIGHT HANDLE (MISCELLANEOUS) ×2 IMPLANT
CUFF TOURN SGL QUICK 34 (TOURNIQUET CUFF) ×2
CUFF TRNQT CYL 34X4X40X1 (TOURNIQUET CUFF) ×1 IMPLANT
DRAIN PENROSE 18X1/2 LTX STRL (DRAIN) ×2 IMPLANT
DRSG PAD ABDOMINAL 8X10 ST (GAUZE/BANDAGES/DRESSINGS) ×2 IMPLANT
DRSG VAC ATS SM SENSATRAC (GAUZE/BANDAGES/DRESSINGS) ×2 IMPLANT
DURAPREP 26ML APPLICATOR (WOUND CARE) ×3 IMPLANT
ELECT REM PT RETURN 15FT ADLT (MISCELLANEOUS) ×2 IMPLANT
GLOVE BIOGEL M STRL SZ7.5 (GLOVE) ×2 IMPLANT
GLOVE BIOGEL PI IND STRL 7.5 (GLOVE) ×1 IMPLANT
GLOVE BIOGEL PI IND STRL 8 (GLOVE) IMPLANT
GLOVE BIOGEL PI IND STRL 8.5 (GLOVE) ×1 IMPLANT
GLOVE BIOGEL PI INDICATOR 7.5 (GLOVE) ×1
GLOVE BIOGEL PI INDICATOR 8 (GLOVE) ×1
GLOVE BIOGEL PI INDICATOR 8.5 (GLOVE) ×1
GLOVE ECLIPSE 8.0 STRL XLNG CF (GLOVE) ×1 IMPLANT
GLOVE ORTHO TXT STRL SZ7.5 (GLOVE) ×4 IMPLANT
GLOVE SURG SS PI 8.0 STRL IVOR (GLOVE) ×1 IMPLANT
GOWN STRL REUS W/TWL 2XL LVL3 (GOWN DISPOSABLE) ×2 IMPLANT
GOWN STRL REUS W/TWL LRG LVL3 (GOWN DISPOSABLE) ×2 IMPLANT
GOWN STRL REUS W/TWL XL LVL3 (GOWN DISPOSABLE) ×4 IMPLANT
HANDPIECE INTERPULSE COAX TIP (DISPOSABLE) ×2
KIT BASIN OR (CUSTOM PROCEDURE TRAY) ×2 IMPLANT
MANIFOLD NEPTUNE II (INSTRUMENTS) ×2 IMPLANT
PACK ORTHO EXTREMITY (CUSTOM PROCEDURE TRAY) ×2 IMPLANT
PAD ABD 8X10 STRL (GAUZE/BANDAGES/DRESSINGS) ×1 IMPLANT
POSITIONER SURGICAL ARM (MISCELLANEOUS) ×2 IMPLANT
SET HNDPC FAN SPRY TIP SCT (DISPOSABLE) ×1 IMPLANT
SOL PREP PROV IODINE SCRUB 4OZ (MISCELLANEOUS) ×2 IMPLANT
TOWEL OR 17X26 10 PK STRL BLUE (TOWEL DISPOSABLE) ×4 IMPLANT

## 2017-07-14 NOTE — Interval H&P Note (Signed)
History and Physical Interval Note:  07/14/2017 12:39 PM  [name]  has presented today for surgery, with the diagnosis of Recurrent Right leg hematoma  The various methods of treatment have been discussed with the patient and family. After consideration of risks, benefits and other options for treatment, the patient has consented to  Procedure(s): EVACUATION RIGHT LEG HEMATOMA POSSIBLE APPLICATION OF WOUND VAC (Right) as a surgical intervention .  The patient's history has been reviewed, patient examined, no change in status, stable for surgery.  I have reviewed the patient's chart and labs.  Questions were answered to the patient's satisfaction.     Mauri Pole

## 2017-07-14 NOTE — Brief Op Note (Signed)
07/14/2017  1:55 PM  PATIENT:  [name]  62 y.o. female  PRE-OPERATIVE DIAGNOSIS:  Recurrent Right leg hematoma  POST-OPERATIVE DIAGNOSIS:  Recurrent Right leg hematoma  PROCEDURE:  Procedure(s): EVACUATION RIGHT LEG HEMATOMA POSSIBLE APPLICATION OF WOUND VAC (Right)  SURGEON:  Surgeon(s) and Role:    Paralee Cancel, MD - Primary  PHYSICIAN ASSISTANT: Nehemiah Massed, PA-C  ANESTHESIA:   general  EBL:  Minimal other than evacuated hematoma   BLOOD ADMINISTERED:none  DRAINS: none  Wound vac placed in wound  LOCAL MEDICATIONS USED:  NONE  SPECIMEN:  No Specimen  DISPOSITION OF SPECIMEN:  N/A  COUNTS:  YES  TOURNIQUET:  * No tourniquets in log *  DICTATION: .Other Dictation: Dictation Number [id number]  PLAN OF CARE: Admit to inpatient   PATIENT DISPOSITION:  PACU - hemodynamically stable.   Delay start of Pharmacological VTE agent (>24hrs) due to surgical blood loss or risk of bleeding: no

## 2017-07-14 NOTE — Anesthesia Procedure Notes (Signed)
Procedure Name: LMA Insertion Date/Time: 07/14/2017 1:44 PM Performed by: Glory Buff, CRNA Pre-anesthesia Checklist: Patient identified, Emergency Drugs available, Suction available and Patient being monitored Patient Re-evaluated:Patient Re-evaluated prior to induction Oxygen Delivery Method: Circle system utilized Preoxygenation: Pre-oxygenation with 100% oxygen Induction Type: IV induction LMA: LMA inserted LMA Size: 4.0 Number of attempts: 1 Placement Confirmation: positive ETCO2 Tube secured with: Tape Dental Injury: Teeth and Oropharynx as per pre-operative assessment

## 2017-07-14 NOTE — Anesthesia Preprocedure Evaluation (Addendum)
Anesthesia Evaluation  Patient identified by MRN, date of birth, ID band Patient awake    Reviewed: Allergy & Precautions, NPO status , Patient's Chart, lab work & pertinent test results  History of Anesthesia Complications (+) PONV and history of anesthetic complications  Airway Mallampati: II  TM Distance: >3 FB Neck ROM: Full    Dental  (+) Teeth Intact   Pulmonary neg pulmonary ROS,    breath sounds clear to auscultation       Cardiovascular + Valvular Problems/Murmurs  Rhythm:Regular Rate:Normal     Neuro/Psych PSYCHIATRIC DISORDERS Anxiety  Neuromuscular disease    GI/Hepatic negative GI ROS, Neg liver ROS,   Endo/Other  negative endocrine ROS  Renal/GU negative Renal ROS     Musculoskeletal  (+) Arthritis ,   Abdominal   Peds  Hematology  (+) anemia ,   Anesthesia Other Findings   Reproductive/Obstetrics                             Anesthesia Physical Anesthesia Plan  ASA: II  Anesthesia Plan: General   Post-op Pain Management:    Induction: Intravenous  PONV Risk Score and Plan: 4 or greater and Ondansetron, Dexamethasone and Scopolamine patch - Pre-op  Airway Management Planned: LMA  Additional Equipment: None  Intra-op Plan:   Post-operative Plan: Extubation in OR  Informed Consent: I have reviewed the patients History and Physical, chart, labs and discussed the procedure including the risks, benefits and alternatives for the proposed anesthesia with the patient or authorized representative who has indicated his/her understanding and acceptance.   Dental advisory given  Plan Discussed with: CRNA and Surgeon  Anesthesia Plan Comments:         Anesthesia Quick Evaluation

## 2017-07-14 NOTE — Transfer of Care (Signed)
Immediate Anesthesia Transfer of Care Note  Patient: [name]  Procedure(s) Performed: EVACUATION RIGHT LEG HEMATOMA WITH APPLICATION OF WOUND VAC (Right Knee)  Patient Location: PACU  Anesthesia Type:General  Level of Consciousness: awake, alert  and oriented  Airway & Oxygen Therapy: Patient Spontanous Breathing and Patient connected to face mask oxygen  Post-op Assessment: Report given to RN and Post -op Vital signs reviewed and stable  Post vital signs: Reviewed and stable  Last Vitals:  Vitals:   07/14/17 1146 07/14/17 1430  BP: 116/66   Pulse: (!) 101   Resp: 16   Temp: 36.9 C (P) 36.9 C  SpO2: 96%     Last Pain:  Vitals:   07/14/17 1205  TempSrc:   PainSc: 0-No pain      Patients Stated Pain Goal: 4 (64/68/03 2122)  Complications: No apparent anesthesia complications

## 2017-07-14 NOTE — H&P (Signed)
[name] is an 62 y.o. female.    Chief Complaint:   Recurrent right leg hematoma  Procedure:  Evacuation of recurrent right leg hematoma and possible placement of wound vac  HPI: Pt is a very pleasant 62 y.o. female who has previously underwent a reimplantation of the right knee and a two-stage technique about 3 months ago.  In the course of recovery she did have a area of recurrent swelling of the distal aspect of her incision, for which she has been to the OR for attempted closure.  Patient has been placed on a wound VAC dressing the patient again had a recurrent area of swelling in the distal aspect of her incision.  I have discussed various options and patient again wishes to return to the OR to see the attempt and closure of the incision.  Dr. Alvan Dame has discussed with the patient his plan of using a wound VAC and the incision see if this will help reduce bleeding.  Dr. Alvan Dame then plans on bringing her back to the OR for closure versus repeated wound VAC changes. Risks, benefits and expectations were discussed with the patient. Patient understand the risks, benefits and expectations and wishes to proceed with surgery.   Risks, benefits and expectations were discussed with the patient.  Risks including but not limited to the risk of anesthesia, blood clots, nerve damage, blood vessel damage, failure of the prosthesis, infection and up to and including death.  Patient understand the risks, benefits and expectations and wishes to proceed with surgery.   PCP: Midge Minium, MD  D/C Plans:       Home   Post-op Meds:       No Rx given  Decadron:      Is to be given  FYI:      ASA  Norco  DME:   Pt already has equipment  PT:   No PT   PMH: Past Medical History:  Diagnosis Date  . 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 Allergic reaction 07/24/2016  . Anemia    needed transfusion spring of 2018  . Arthritis KNEES   right knee, hx. past left knee replacemnt.  . Blood clot  in vein    around Portacath right chest-tx. Eliquis about a yr., prior Lovenox.  . Blood dyscrasia   . Chronic anticoagulation HX  PORT-A-CATH CLOT   2010 - HAS BEEN ON LOVENOX SINCE THE CLOT  . Drug rash 07/24/2016  . Elevated blood pressure reading without diagnosis of hypertension    PT MONITORS HER B/P AT HOME  . Heart murmur   . Hematoma of leg, right, sequela   . 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 PONV (postoperative nausea and vomiting)    ponv likes scopolamine patch  . Port-A-Cath in place    RIGHT CHEST   . 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 Skin changes related to chemotherapy    per patient she has scabbed over skin circumventing port to r ight upper chest ; denie drainage and state it is due to chemptherapy   . Status post skin flap graft  right chest wall with metastatis right anterior chest -no drainage or open wound.    PSH: Past Surgical History:  Procedure Laterality Date  . APPLICATION OF A-CELL OF BACK Right 07/31/2016   Procedure: CELLERATE COLLAGEN PLACEMENT;  Surgeon: Loel Lofty Dillingham, DO;  Location: [place];  Service: Plastics;  Laterality: Right;  . APPLICATION OF WOUND VAC Right 06/29/2017   Procedure: APPLICATION OF WOUND VAC;  Surgeon: Wallace Going, DO;  Location: [facility];  Service: Plastics;  Laterality: Right;  . CHEST WALL TUMOR EXCISION     right  . EXCISIONAL TOTAL KNEE ARTHROPLASTY WITH ANTIBIOTIC SPACERS Right 12/02/2016   Procedure: EXCISIONAL TOTAL KNEE ARTHROPLASTY WITH ANTIBIOTIC SPACERS;  Surgeon: Paralee Cancel, MD;  Location: [facility];  Service: Orthopedics;  Laterality: Right;  90 mins  . HEMATOMA EVACUATION Right 06/29/2017   Procedure: EVACUATION  HEMATOMA OF RIGHT KNEE;  Surgeon: Wallace Going, DO;  Location: [facility];  Service: Plastics;  Laterality: Right;  . HEMATOMA EVACUATION Right 06/29/2017   Procedure: EVACUATION RIGHT TOTAL KNEE HEMATOMA;  Surgeon: Paralee Cancel, MD;  Location: [facility];  Service: Orthopedics;  Laterality: Right;  . hemotoma     evacuation left chest wall  . I&D KNEE WITH POLY EXCHANGE Right 05/28/2016   Procedure: IRRIGATION AND DEBRIDEMENT KNEE WOUND VAC PLACMENT;  Surgeon: Susa Day, MD;  Location: [facility];  Service: Orthopedics;  Laterality: Right;  . I&D KNEE WITH POLY EXCHANGE Right 05/30/2016   Procedure: RADICAL SYNOVECTOMY,IRRIGATION AND DEBRIDEMENT KNEE WITH POLY EXCHANGE WITH ANTIBIOTIC BEADS, APPLICATION OF WOUND VAC;  Surgeon: Susa Day, MD;  Location: [facility];  Service: Orthopedics;  Laterality: Right;  . INCISION AND DRAINAGE OF WOUND Right 07/31/2016   Procedure: IRRIGATION AND DEBRIDEMENT RIGHT KNEE  WOUND;  Surgeon: Loel Lofty Dillingham, DO;  Location: [place];  Service: Plastics;  Laterality: Right;  . IR FLUORO GUIDE CV LINE LEFT  04/30/2017  . IR US GUIDE VASC ACCESS LEFT  04/30/2017  . IRRIGATION AND DEBRIDEMENT KNEE Right 04/12/2016   Procedure: IRRIGATION AND DEBRIDEMENT KNEE;  Surgeon: Nicholes Stairs, MD;  Location[facility];  Service: Orthopedics;  Laterality: Right;  . IRRIGATION AND DEBRIDEMENT KNEE Right 12/16/2016   Procedure: Repeat irrigation and debridement right knee, wound closure  wound vac REPLACEMENT ANTIBIOTIC SPACERS;  Surgeon: Paralee Cancel, MD;  Location[facility];  Service: Orthopedics;  Laterality: Right;  . KNEE ARTHROSCOPY  02/13/2012   Procedure: ARTHROSCOPY KNEE;  Surgeon: Johnn Hai, MD;  Location: [facility];  Service: Orthopedics;  Laterality: Left;  WITH DEBRIDEMENt   . KNEE ARTHROSCOPY WITH LATERAL MENISECTOMY  02/13/2012   Procedure: KNEE ARTHROSCOPY WITH LATERAL MENISECTOMY;  Surgeon: Johnn Hai, MD;  Location: [place];  Service: Orthopedics;;  partial  . LEFT MODIFIED RADICAL MASTECTOMY/ RIGHT TOTAL MASTECTOMY  11-13-2007   LEFT BREAST CANCER W/ AXILLARY LYMPH NODE METASTASIS AND POST NEOADJUVANT CHEMO  . MASS EXCISION Right 06/29/2017   Procedure: EXCISION OF METASTATIC BREAST CANCER TO SKIN RIGHT SHOULDER, PLACEMENT OF CELLERATE;  Surgeon: Wallace Going, DO;  Location[facility];  Service: Plastics;  Laterality: Right;  . MASTECTOMY     Bilateral  . PLACEMENT PORT-A-CATH  06/22/2007   right chest new port placed 2015, right chest  . REIMPLANTATION OF TOTAL KNEE Right 04/27/2017   Procedure: Reimplantation of right total knee arthroplasty;  Surgeon: Paralee Cancel, MD;  Location[facility];  Service: Orthopedics;  Laterality: Right;  Adductor Block  .  SKIN GRAFT     chest-post tumor removal, second occurrence of cancer" 2015 surgery for Flap 2015"  . SKIN SPLIT GRAFT Right 01/29/2017   Procedure: SKIN GRAFT SPLIT THICKNESS TO RIGHT KNEE WOUND;  Surgeon: Wallace Going, DO;  Location[facility];  Service: Plastics;  Laterality: Right;  . TOTAL KNEE ARTHROPLASTY Left 09/23/2012   Procedure: LEFT TOTAL KNEE ARTHROPLASTY;  Surgeon: Johnn Hai, MD;  Location[facility];  Service: Orthopedics;  Laterality: Left;  . TOTAL KNEE ARTHROPLASTY Right 03/20/2016   Procedure: RIGHT TOTAL KNEE ARTHROPLASTY;  Surgeon: Susa Day, MD;  Location[facility];  Service: Orthopedics;  Laterality: Right;  . TRANSTHORACIC ECHOCARDIOGRAM  11-18-2011  DR BENSIMHON (ECHO EVERY 3 MONTHS)   HX CHEMO INDUCED CARDIOTOXICITY/  LVSF NORMAL/ EF 55-50%/ MILD MITRIAL VALVE REGURG./ MILDLY INCREASED SYSTOLIC PRESSURE OF PULMONARY ARTERIES  . VASCULAR SURGERY Right    portacath chest    Social History:  reports that  has never smoked. she has never used smokeless tobacco. She reports that she does not drink alcohol or use drugs.  Allergies:  Allergies  Allergen Reactions  . Penicillins Hives, Itching and Rash    Has patient had a  PCN reaction causing immediate rash, facial/tongue/throat swelling, SOB or lightheadedness with hypotension: yes Has patient had a PCN reaction causing severe rash involving mucus membranes or skin necrosis: No Has patient had a PCN reaction that required hospitalization No Has patient had a PCN reaction occurring within the last 10 years: yes If all of the above answers are "NO", then may proceed with Cephalosporin use.   Denies airway involvement   . Other Rash    STERI STRIPS - Blisters  . Tape Hives    Can tolerate paper tape    Medications: No current facility-administered medications for this encounter.    Current Outpatient Medications  Medication Sig Dispense Refill  . acetaminophen (TYLENOL) 500 MG tablet Take 500 mg by mouth every 8 (eight) hours as needed for mild pain or headache.    . Ado-Trastuzumab Emtansine (KADCYLA IV) Inject into the vein every 21 ( twenty-one) days. At [facility] [facility] 07/07/2017    . cetirizine (ZYRTEC) 10 MG tablet Take 1 tablet (10 mg total) daily by mouth. 30 tablet 11  . Cholecalciferol (VITAMIN D3) 2000 UNITS TABS Take 2,000 Units by mouth 4 (four) times a week.     . darifenacin (ENABLEX) 15 MG 24 hr tablet Take 15 mg by mouth daily.  10  . docusate sodium (COLACE) 100 MG capsule Take 1 capsule (100 mg total) by mouth 2 (two) times daily. 10 capsule 0  . doxycycline (VIBRA-TABS) 100 MG tablet Take 100 mg by mouth 2 (two) times daily. 6 month course started 05-2017    . ferrous sulfate (FERROUSUL) 325 (65 FE) MG tablet Take 1 tablet (325 mg total) by mouth 3 (three) times daily with meals. 90 tablet 1  . gabapentin (NEURONTIN) 300 MG capsule TAKE 1 CAPSULE BY MOUTH 3 TIMES DAILY (Patient taking differently: TAKE 1 CAPSULE BY MOUTH 5 TIMES DAILY FOR 30 DAYS) 90 capsule 6  . methocarbamol (ROBAXIN) 500 MG tablet Take 1 tablet (500 mg total) by mouth every 6 (six) hours as needed for muscle spasms. 40 tablet 0  . Multiple Vitamin (MULTIVITAMIN  WITH MINERALS) TABS tablet Take 1 tablet by mouth daily.    . ondansetron (ZOFRAN) 8 MG tablet Take 1 tablet (8 mg total) by mouth every 8 (eight) hours as needed for  nausea. (Patient taking differently: Take 8 mg by mouth every 8 (eight) hours as needed for nausea or vomiting. Uses after chemo treatments) 20 tablet 6  . oxyCODONE (OXY IR/ROXICODONE) 5 MG immediate release tablet Take 1-3 tablets (5-15 mg total) by mouth every 4 (four) hours as needed for severe pain. (Patient taking differently: Take 5-10 mg by mouth every 6 (six) hours as needed for severe pain (1-2 tablets depends on pain level). ) 90 tablet 0  . pertuzumab in sodium chloride 0.9 % 250 mL Inject into the vein every 21 ( twenty-one) days. Infused at [facility] every 21 days next treatment 07/28/17    . polyethylene glycol (MIRALAX / GLYCOLAX) packet Take 17 g by mouth 2 (two) times daily. (Patient taking differently: Take 17 g by mouth daily as needed for mild constipation. ) 14 each 0  . celecoxib (CELEBREX) 200 MG capsule Take 1 capsule (200 mg total) by mouth every 12 (twelve) hours. (Patient not taking: Reported on 05/01/2017) 60 capsule 1  . enoxaparin (LOVENOX) 80 MG/0.8ML injection Inject 0.4 mLs (40 mg total) into the skin daily. (Patient not taking: Reported on 07/13/2017) 14 Syringe 0  . furosemide (LASIX) 20 MG tablet Take 1 tablet (20 mg total) by mouth daily as needed for edema. (Patient not taking: Reported on 07/13/2017) 30 tablet 0  . zolpidem (AMBIEN) 5 MG tablet TAKE 1 TABLET BY MOUTH ONCE DAILY AT BEDTIME AS NEEDED FOR SLEEP 30 tablet 5      Review of Systems  Constitutional: Negative.   HENT: Negative.   Eyes: Negative.   Respiratory: Negative.   Cardiovascular: Negative.   Gastrointestinal: Negative.   Genitourinary: Negative.   Musculoskeletal: Positive for joint pain.  Skin: Negative.   Neurological: Negative.   Endo/Heme/Allergies: Positive for environmental allergies.  Psychiatric/Behavioral: The patient has  insomnia.        Physical Exam  Constitutional: She is oriented to person, place, and time. She appears well-developed.  HENT:  Head: Normocephalic.  Eyes: Pupils are equal, round, and reactive to light.  Neck: Neck supple. No JVD present. No tracheal deviation present. No thyromegaly present.  Cardiovascular: Normal rate, regular rhythm and intact distal pulses.  Murmur heard. Respiratory: Effort normal and breath sounds normal. No respiratory distress. She has no wheezes.  GI: Soft. There is no tenderness. There is no guarding.  Musculoskeletal:       Right knee: She exhibits decreased range of motion, swelling and laceration. Tenderness found.  Lymphadenopathy:    She has no cervical adenopathy.  Neurological: She is alert and oriented to person, place, and time.  Skin: Skin is warm and dry.  Psychiatric: She has a normal mood and affect.       Assessment/Plan Assessment:   Recurrent right leg hematoma  Plan: Patient will undergo a evacuation of recurrent right leg hematoma and possible placement of wound vac on 07/14/2017 per Dr. Alvan Dame at [facility]. Risks benefits and expectations were discussed with the patient. Patient understand risks, benefits and expectations and wishes to proceed.   West Pugh Chau Savell   PA-C  07/14/2017, 9:24 AM

## 2017-07-15 ENCOUNTER — Encounter (HOSPITAL_COMMUNITY): Payer: Self-pay | Admitting: Orthopedic Surgery

## 2017-07-15 LAB — CBC
HEMATOCRIT: 28.2 % — AB (ref 36.0–46.0)
HEMOGLOBIN: 9.2 g/dL — AB (ref 12.0–15.0)
MCH: 32.4 pg (ref 26.0–34.0)
MCHC: 32.6 g/dL (ref 30.0–36.0)
MCV: 99.3 fL (ref 78.0–100.0)
Platelets: 157 10*3/uL (ref 150–400)
RBC: 2.84 MIL/uL — ABNORMAL LOW (ref 3.87–5.11)
RDW: 16.8 % — ABNORMAL HIGH (ref 11.5–15.5)
WBC: 2.6 10*3/uL — ABNORMAL LOW (ref 4.0–10.5)

## 2017-07-15 LAB — BASIC METABOLIC PANEL
Anion gap: 10 (ref 5–15)
BUN: 15 mg/dL (ref 6–20)
CHLORIDE: 105 mmol/L (ref 101–111)
CO2: 23 mmol/L (ref 22–32)
CREATININE: 0.56 mg/dL (ref 0.44–1.00)
Calcium: 8.7 mg/dL — ABNORMAL LOW (ref 8.9–10.3)
GFR calc Af Amer: >60 mL/min (ref >60)
GFR calc non Af Amer: >60 mL/min (ref >60)
Glucose, Bld: 114 mg/dL — ABNORMAL HIGH (ref 65–99)
Potassium: 3.8 mmol/L (ref 3.5–5.1)
Sodium: 138 mmol/L (ref 135–145)

## 2017-07-15 MED ORDER — ENOXAPARIN SODIUM 30 MG/0.3ML ~~LOC~~ SOLN
30.0000 mg | Freq: Once | SUBCUTANEOUS | Status: AC
  Administered 2017-07-16: 30 mg via SUBCUTANEOUS
  Filled 2017-07-15: qty 0.3

## 2017-07-15 NOTE — Progress Notes (Signed)
PT Cancellation Note / Screen  Patient Details Name: [name] MRN: [id number] DOB: Oct 19, 1955   Cancelled Treatment:    Reason Eval/Treat Not Completed: PT screened, no needs identified, will sign off Pt seen up ambulating in hallway with spouse.  Pt reports doing well this admission and no needs at this time.  PT to sign off.   Steph Cheadle,KATHrine E 07/15/2017, 11:03 AM [name], PT, DPT 07/15/2017 Pager: [phone]

## 2017-07-15 NOTE — Progress Notes (Addendum)
     Subjective: 1 Day Post-Op Procedure(s) (LRB): EVACUATION RIGHT LEG HEMATOMA WITH APPLICATION OF WOUND VAC (Right)   Patient reports pain as mild, pain controlled. No events throughout the night.  No complications with wound vac.  We have discussed the need for repeat surgery do to the complications with previous wound closures / wound vacs.  Patient has had previous failures of wound Vac and it needs to be monitored closely to allow wound to heal. Will also need to closely manage her anticoagulation with her significant dosing of Lovenox and the balance to allow the wound to heal.  Discussed dosing her Lovenox tomorrow and stopping prior to surgery.  Objective:   VITALS:   Vitals:   07/15/17 0200 07/15/17 0605  BP: (!) 103/48 (!) 91/47  Pulse: 79 87  Resp: 16 16  Temp: (!) 97.5 F (36.4 C) 98.2 F (36.8 C)  SpO2: 94% 94%    Dorsiflexion/Plantar flexion intact Incision: dressing C/D/I No cellulitis present Compartment soft  LABS Recent Labs    07/15/17 0555  HGB 9.2*  HCT 28.2*  WBC 2.6*  PLT 157    Recent Labs    07/15/17 0555  NA 138  K 3.8  BUN 15  CREATININE 0.56  GLUCOSE 114*     Assessment/Plan: 1 Day Post-Op Procedure(s) (LRB): EVACUATION RIGHT LEG HEMATOMA WITH APPLICATION OF WOUND VAC (Right) Wound vac placed on 125 mm of pressure Advance diet Up with therapy D/C IV fluids Plan on one dose of Lovenox tomorrow Plan on surgery Friday, repeat I&D of left leg with repeat wound vac versus closure. Order placed to obtain consent and NPO after midnight tomorrow    West Pugh. Mikolaj Woolstenhulme   PAC  07/15/2017, 9:14 AM

## 2017-07-15 NOTE — Op Note (Signed)
NAME:  [name], [name]                       ACCOUNT NO.:  MEDICAL RECORD NO.:  [id number]  LOCATION:                                 FACILITY:  PHYSICIAN:  Pietro Cassis. Alvan Dame, M.D.  DATE OF BIRTH:  06/29/1955  DATE OF PROCEDURE:  07/14/2017 DATE OF DISCHARGE:                              OPERATIVE REPORT   PREOPERATIVE DIAGNOSIS:  Recurrent hematoma, right leg with wound compromise.  POSTOPERATIVE DIAGNOSIS:  Recurrent hematoma, right leg with wound compromise.  PROCEDURE:  Evacuation of right leg hematoma, application of small wound VAC.  SURGEON:  Pietro Cassis. Alvan Dame, M.D.  ASSISTANT:  Nehemiah Massed, PA-C.  ANESTHESIA:  General.  SPECIMENS:  None.  COMPLICATION:  None apparent.  BLOOD LOSS:  Minimal other than evacuated hematoma.  INDICATIONS FOR PROCEDURE:  [name] is a 62 year old female with longstanding complicating issues involving her right leg following total knee arthroplasty.  This has been complicated by infection and wound issues.  She has had some recurrent hematoma in the lower portion of her leg following her last reimplantation that has resulted in some skin breakdown.  Given the recurrent nature of this and attempt to try to manage conservatively, we elected at this point to place a wound VAC to see if we can get this to settle down a bit.  Consent was obtained after reviewing risks, benefits and the plan.  PROCEDURE IN DETAIL:  The patient was brought to the operative theater. Once adequate anesthesia, preoperative antibiotics, vancomycin administered, she was positioned supine.  A right thigh tourniquet was placed, but not utilized.  The right lower extremity was then prepped and draped in sterile fashion.  A time-out was performed identifying the patient, planned procedure and extremity.  Given the most recent procedure less than 2 weeks ago, I removed her sutures.  The upper central portion of this past incision, which still was in the lower portion  of her knee replacement incision over the leg, had widened about a cm in diameter. Once I removed the sutures, I opened up the wound and evacuated a large hematoma in this area.  I then used 1 L normal saline solution, then pulse lavaged this area to debride the hematoma.  I did remove some nonviable subcutaneous tissue as well as some skin edges.  She did have punctate bleeding of this area like similar.  For that reason, this was my plan to put a wound VAC and see if we can decrease the volume of blood in this area.  The wound VAC was then applied routinely without complication and set at 75 units of pressure.  I plan to have her remain in the hospital until Friday while I do a wound VAC in the OR to evaluate the wound as well as to replace the wound VAC.  I then work on disposition from that standpoint, most likely the home wound VAC and changes through our office with supplies provided from facility.     Pietro Cassis Alvan Dame, M.D.     MDO/MEDQ  D:  07/14/2017  T:  07/14/2017  Job:  [id number]

## 2017-07-15 NOTE — Anesthesia Postprocedure Evaluation (Signed)
Anesthesia Post Note  Patient: [name]  Procedure(s) Performed: EVACUATION RIGHT LEG HEMATOMA WITH APPLICATION OF WOUND VAC (Right Knee)     Patient location during evaluation: PACU Anesthesia Type: General Level of consciousness: awake and alert Pain management: pain level controlled Vital Signs Assessment: post-procedure vital signs reviewed and stable Respiratory status: spontaneous breathing, nonlabored ventilation, respiratory function stable and patient connected to nasal cannula oxygen Cardiovascular status: blood pressure returned to baseline and stable Postop Assessment: no apparent nausea or vomiting Anesthetic complications: no    Last Vitals:  Vitals:   07/15/17 0932 07/15/17 1319  BP: (!) 90/45 (!) 90/44  Pulse: 90 92  Resp: 15 14  Temp: 36.8 C 37.1 C  SpO2: 97% 95%    Last Pain:  Vitals:   07/15/17 2000  TempSrc:   PainSc: 2     LLE Motor Response: Purposeful movement;Responds to commands (07/15/17 2002) LLE Sensation: Full sensation(Chronic numbness/tingling) (07/15/17 2002) RLE Motor Response: Purposeful movement;Responds to commands (07/15/17 2002) RLE Sensation: Full sensation(Chronic numbness/tingling) (07/15/17 2002)      Sakshi Sermons

## 2017-07-15 NOTE — Progress Notes (Signed)
OT Cancellation Note  Patient Details Name: [name] MRN: [id number] DOB: 09-18-1955   Cancelled Treatment:    Reason Eval/Treat Not Completed: OT screened, no needs identified, will sign off.  Noted PT signed off on pt. OT has screened her for past surgeries. Will sign off.  [name] 07/15/2017, 1:01 PM  Lesle Chris, OTR/L [phone] 07/15/2017

## 2017-07-16 LAB — BASIC METABOLIC PANEL
Anion gap: 8 (ref 5–15)
BUN: 14 mg/dL (ref 6–20)
CHLORIDE: 110 mmol/L (ref 101–111)
CO2: 22 mmol/L (ref 22–32)
CREATININE: 0.59 mg/dL (ref 0.44–1.00)
Calcium: 8.6 mg/dL — ABNORMAL LOW (ref 8.9–10.3)
GFR calc Af Amer: >60 mL/min (ref >60)
GFR calc non Af Amer: >60 mL/min (ref >60)
GLUCOSE: 93 mg/dL (ref 65–99)
Potassium: 3.7 mmol/L (ref 3.5–5.1)
Sodium: 140 mmol/L (ref 135–145)

## 2017-07-16 LAB — CBC
HEMATOCRIT: 27.8 % — AB (ref 36.0–46.0)
Hemoglobin: 9 g/dL — ABNORMAL LOW (ref 12.0–15.0)
MCH: 32.8 pg (ref 26.0–34.0)
MCHC: 32.4 g/dL (ref 30.0–36.0)
MCV: 101.5 fL — ABNORMAL HIGH (ref 78.0–100.0)
Platelets: 172 10*3/uL (ref 150–400)
RBC: 2.74 MIL/uL — ABNORMAL LOW (ref 3.87–5.11)
RDW: 17.4 % — AB (ref 11.5–15.5)
WBC: 4.3 10*3/uL (ref 4.0–10.5)

## 2017-07-16 NOTE — Progress Notes (Signed)
     Subjective: 2 Days Post-Op Procedure(s) (LRB): EVACUATION RIGHT LEG HEMATOMA WITH APPLICATION OF WOUND VAC (Right)   Patient reports pain as mild, pain controlled. Feels that she is doing well.  No complications with wound vac.  We again have discussed the need for repeat surgery do to the complications with previous wound closures / wound vacs.  Patient has had previous failures of wound Vac and it needs to be monitored closely to allow wound to heal. Will also need to closely manage her anticoagulation with her significant dosing of Lovenox and the balance to allow the wound to heal.  Discussed dosing her Lovenox today and how we will hold tomorrow for surgery.  She mentioned her doctor stated that she can restart on 30 of Lovenox until her risk of bleeding is decrease and then get back on her regular dose of 80.   Objective:   VITALS:   Vitals:   07/16/17 0447 07/16/17 0800  BP: 111/66 (!) 103/54  Pulse: 72 85  Resp: 16   Temp: 98.4 F (36.9 C) 98.4 F (36.9 C)  SpO2:  95%    Dorsiflexion/Plantar flexion intact Incision: minimal drainage in wound vac No cellulitis present Compartment soft  LABS Recent Labs    07/15/17 0555 07/16/17 0539  HGB 9.2* 9.0*  HCT 28.2* 27.8*  WBC 2.6* 4.3  PLT 157 172    Recent Labs    07/15/17 0555 07/16/17 0539  NA 138 140  K 3.8 3.7  BUN 15 14  CREATININE 0.56 0.59  GLUCOSE 114* 93     Assessment/Plan: 2 Days Post-Op Procedure(s) (LRB): EVACUATION RIGHT LEG HEMATOMA WITH APPLICATION OF WOUND VAC (Right)  Activity ad lib NPO after MN, surgery tomorrow Consent should have been with repeat I&D and wound closure vs reapplication of wound vac     West Pugh. Tyrick Dunagan   PAC  07/16/2017, 12:57 PM

## 2017-07-17 ENCOUNTER — Inpatient Hospital Stay (HOSPITAL_COMMUNITY): Payer: 59 | Admitting: Certified Registered Nurse Anesthetist

## 2017-07-17 ENCOUNTER — Encounter (HOSPITAL_COMMUNITY)
Admission: AD | Disposition: A | Discharge status: Home / Self Care | Payer: Self-pay | Source: Ambulatory Visit | Attending: Orthopedic Surgery

## 2017-07-17 HISTORY — PX: I & D EXTREMITY: SHX5045

## 2017-07-17 LAB — CBC
HEMATOCRIT: 28 % — AB (ref 36.0–46.0)
HEMATOCRIT: 31.5 % — AB (ref 36.0–46.0)
HEMOGLOBIN: 10.4 g/dL — AB (ref 12.0–15.0)
Hemoglobin: 9 g/dL — ABNORMAL LOW (ref 12.0–15.0)
MCH: 32.8 pg (ref 26.0–34.0)
MCH: 33.5 pg (ref 26.0–34.0)
MCHC: 32.1 g/dL (ref 30.0–36.0)
MCHC: 33 g/dL (ref 30.0–36.0)
MCV: 102.2 fL — AB (ref 78.0–100.0)
MCV: 101.6 fL — ABNORMAL HIGH (ref 78.0–100.0)
Platelets: 180 10*3/uL (ref 150–400)
Platelets: 204 10*3/uL (ref 150–400)
RBC: 2.74 MIL/uL — ABNORMAL LOW (ref 3.87–5.11)
RBC: 3.1 MIL/uL — ABNORMAL LOW (ref 3.87–5.11)
RDW: 17.6 % — AB (ref 11.5–15.5)
RDW: 17 % — ABNORMAL HIGH (ref 11.5–15.5)
WBC: 4.2 10*3/uL (ref 4.0–10.5)
WBC: 3.8 10*3/uL — AB (ref 4.0–10.5)

## 2017-07-17 LAB — BASIC METABOLIC PANEL
ANION GAP: 8 (ref 5–15)
BUN: 11 mg/dL (ref 6–20)
CALCIUM: 8.5 mg/dL — AB (ref 8.9–10.3)
CO2: 21 mmol/L — AB (ref 22–32)
Chloride: 112 mmol/L — ABNORMAL HIGH (ref 101–111)
Creatinine, Ser: 0.59 mg/dL (ref 0.44–1.00)
GFR calc Af Amer: >60 mL/min (ref >60)
GFR calc non Af Amer: >60 mL/min (ref >60)
GLUCOSE: 84 mg/dL (ref 65–99)
Potassium: 3.5 mmol/L (ref 3.5–5.1)
Sodium: 141 mmol/L (ref 135–145)

## 2017-07-17 LAB — CREATININE, SERUM
CREATININE: 0.67 mg/dL (ref 0.44–1.00)
GFR calc Af Amer: >60 mL/min (ref >60)

## 2017-07-17 LAB — BPAM RBC
Blood Product Expiration Date: 201903192359
Unit Type and Rh: 5100

## 2017-07-17 LAB — TYPE AND SCREEN
ABO/RH(D): A POS
Antibody Screen: POSITIVE
DONOR AG TYPE: NEGATIVE
Unit division: 0

## 2017-07-17 SURGERY — IRRIGATION AND DEBRIDEMENT EXTREMITY
Anesthesia: General | Site: Knee | Laterality: Right

## 2017-07-17 MED ORDER — DEXAMETHASONE SODIUM PHOSPHATE 10 MG/ML IJ SOLN
INTRAMUSCULAR | Status: DC | PRN
  Administered 2017-07-17: 8 mg via INTRAVENOUS

## 2017-07-17 MED ORDER — LIDOCAINE 2% (20 MG/ML) 5 ML SYRINGE
INTRAMUSCULAR | Status: AC
  Filled 2017-07-17: qty 5

## 2017-07-17 MED ORDER — SCOPOLAMINE 1 MG/3DAYS TD PT72
1.0000 | MEDICATED_PATCH | TRANSDERMAL | Status: DC
  Administered 2017-07-17: 1.5 mg via TRANSDERMAL

## 2017-07-17 MED ORDER — VANCOMYCIN HCL IN DEXTROSE 1-5 GM/200ML-% IV SOLN
1000.0000 mg | Freq: Two times a day (BID) | INTRAVENOUS | Status: AC
  Administered 2017-07-18: 1000 mg via INTRAVENOUS
  Filled 2017-07-17: qty 200

## 2017-07-17 MED ORDER — SODIUM CHLORIDE 0.9 % IV SOLN
INTRAVENOUS | Status: DC
  Administered 2017-07-17: 21:00:00 via INTRAVENOUS

## 2017-07-17 MED ORDER — SODIUM CHLORIDE 0.9 % IR SOLN
Status: DC | PRN
  Administered 2017-07-17: 3000 mL

## 2017-07-17 MED ORDER — DEXAMETHASONE SODIUM PHOSPHATE 10 MG/ML IJ SOLN: 10.0000 mg | Freq: Once | INTRAMUSCULAR | Status: DC

## 2017-07-17 MED ORDER — OXYCODONE HCL 5 MG PO TABS: 5.0000 mg | ORAL_TABLET | ORAL | 0 refills | Status: DC | PRN

## 2017-07-17 MED ORDER — FENTANYL CITRATE (PF) 100 MCG/2ML IJ SOLN
INTRAMUSCULAR | Status: AC
  Filled 2017-07-17: qty 2

## 2017-07-17 MED ORDER — SCOPOLAMINE 1 MG/3DAYS TD PT72
MEDICATED_PATCH | TRANSDERMAL | Status: AC
  Filled 2017-07-17: qty 1

## 2017-07-17 MED ORDER — LACTATED RINGERS IV SOLN: INTRAVENOUS | Status: DC

## 2017-07-17 MED ORDER — LACTATED RINGERS IV SOLN
INTRAVENOUS | Status: DC | PRN
  Administered 2017-07-17: 16:00:00 via INTRAVENOUS

## 2017-07-17 MED ORDER — LACTATED RINGERS IV SOLN
INTRAVENOUS | Status: DC
  Administered 2017-07-17: 15:00:00 via INTRAVENOUS

## 2017-07-17 MED ORDER — HYDROMORPHONE HCL 1 MG/ML IJ SOLN: 0.2500 mg | INTRAMUSCULAR | Status: DC | PRN

## 2017-07-17 MED ORDER — MEPERIDINE HCL 50 MG/ML IJ SOLN: 6.2500 mg | INTRAMUSCULAR | Status: DC | PRN

## 2017-07-17 MED ORDER — ONDANSETRON HCL 4 MG/2ML IJ SOLN
INTRAMUSCULAR | Status: AC
  Filled 2017-07-17: qty 2

## 2017-07-17 MED ORDER — PROMETHAZINE HCL 25 MG/ML IJ SOLN: 6.2500 mg | INTRAMUSCULAR | Status: DC | PRN

## 2017-07-17 MED ORDER — FENTANYL CITRATE (PF) 100 MCG/2ML IJ SOLN
INTRAMUSCULAR | Status: DC | PRN
  Administered 2017-07-17 (×2): 25 ug via INTRAVENOUS
  Administered 2017-07-17: 50 ug via INTRAVENOUS

## 2017-07-17 MED ORDER — ENOXAPARIN SODIUM 30 MG/0.3ML ~~LOC~~ SOLN: 1.0000 mg/kg | SUBCUTANEOUS | 1 refills | Status: DC

## 2017-07-17 MED ORDER — LIDOCAINE 2% (20 MG/ML) 5 ML SYRINGE
INTRAMUSCULAR | Status: DC | PRN
  Administered 2017-07-17: 50 mg via INTRAVENOUS

## 2017-07-17 MED ORDER — ONDANSETRON HCL 4 MG/2ML IJ SOLN
INTRAMUSCULAR | Status: DC | PRN
  Administered 2017-07-17: 4 mg via INTRAVENOUS

## 2017-07-17 MED ORDER — VANCOMYCIN HCL IN DEXTROSE 1-5 GM/200ML-% IV SOLN
INTRAVENOUS | Status: AC
  Filled 2017-07-17: qty 200

## 2017-07-17 MED ORDER — PROPOFOL 10 MG/ML IV BOLUS
INTRAVENOUS | Status: DC | PRN
  Administered 2017-07-17: 200 mg via INTRAVENOUS

## 2017-07-17 MED ORDER — PROPOFOL 10 MG/ML IV BOLUS
INTRAVENOUS | Status: AC
  Filled 2017-07-17: qty 20

## 2017-07-17 MED ORDER — DEXAMETHASONE SODIUM PHOSPHATE 10 MG/ML IJ SOLN
INTRAMUSCULAR | Status: AC
  Filled 2017-07-17: qty 1

## 2017-07-17 MED ORDER — METHOCARBAMOL 500 MG PO TABS: 500.0000 mg | ORAL_TABLET | Freq: Four times a day (QID) | ORAL | 0 refills | Status: DC | PRN

## 2017-07-17 MED ORDER — HYDROCODONE-ACETAMINOPHEN 7.5-325 MG PO TABS
1.0000 | ORAL_TABLET | ORAL | Status: DC | PRN
  Administered 2017-07-17: 1 via ORAL
  Filled 2017-07-17: qty 1

## 2017-07-17 MED ORDER — ENOXAPARIN SODIUM 30 MG/0.3ML ~~LOC~~ SOLN
30.0000 mg | SUBCUTANEOUS | Status: DC
  Administered 2017-07-18: 30 mg via SUBCUTANEOUS
  Filled 2017-07-17: qty 0.3

## 2017-07-17 MED ORDER — SODIUM CHLORIDE 0.9 % IV SOLN
INTRAVENOUS | Status: DC | PRN
  Administered 2017-07-17: 1000 mg via INTRAVENOUS

## 2017-07-17 SURGICAL SUPPLY — 46 items
BAG SPEC THK2 15X12 ZIP CLS (MISCELLANEOUS)
BAG ZIPLOCK 12X15 (MISCELLANEOUS) IMPLANT
BANDAGE ELASTIC 6 VELCRO ST LF (GAUZE/BANDAGES/DRESSINGS) ×1 IMPLANT
BANDAGE ESMARK 6X9 LF (GAUZE/BANDAGES/DRESSINGS) ×1 IMPLANT
BLADE SAW SGTL 11.0X1.19X90.0M (BLADE) IMPLANT
BNDG CMPR 9X6 STRL LF SNTH (GAUZE/BANDAGES/DRESSINGS)
BNDG COHESIVE 4X5 TAN STRL (GAUZE/BANDAGES/DRESSINGS) ×1 IMPLANT
BNDG ESMARK 6X9 LF (GAUZE/BANDAGES/DRESSINGS)
BNDG GAUZE ELAST 4 BULKY (GAUZE/BANDAGES/DRESSINGS) ×1 IMPLANT
COVER SURGICAL LIGHT HANDLE (MISCELLANEOUS) ×2 IMPLANT
CUFF TOURN SGL QUICK 18 (TOURNIQUET CUFF) ×1 IMPLANT
CUFF TOURN SGL QUICK 24 (TOURNIQUET CUFF)
CUFF TOURN SGL QUICK 34 (TOURNIQUET CUFF)
CUFF TRNQT CYL 24X4X40X1 (TOURNIQUET CUFF) ×1 IMPLANT
CUFF TRNQT CYL 34X4X40X1 (TOURNIQUET CUFF) IMPLANT
DRAIN PENROSE 18X1/2 LTX STRL (DRAIN) ×1 IMPLANT
DRAPE U-SHAPE 47X51 STRL (DRAPES) ×1 IMPLANT
DRSG PAD ABDOMINAL 8X10 ST (GAUZE/BANDAGES/DRESSINGS) ×1 IMPLANT
DRSG VAC ATS SM SENSATRAC (GAUZE/BANDAGES/DRESSINGS) ×2 IMPLANT
DURAPREP 26ML APPLICATOR (WOUND CARE) ×2 IMPLANT
ELECT REM PT RETURN 15FT ADLT (MISCELLANEOUS) ×2 IMPLANT
GAUZE SPONGE 4X4 12PLY STRL (GAUZE/BANDAGES/DRESSINGS) ×2 IMPLANT
GLOVE BIOGEL M 7.0 STRL (GLOVE) IMPLANT
GLOVE BIOGEL PI IND STRL 7.5 (GLOVE) ×1 IMPLANT
GLOVE BIOGEL PI IND STRL 8.5 (GLOVE) ×1 IMPLANT
GLOVE BIOGEL PI INDICATOR 7.5 (GLOVE)
GLOVE BIOGEL PI INDICATOR 8.5 (GLOVE) ×1
GLOVE ECLIPSE 8.0 STRL XLNG CF (GLOVE) IMPLANT
GLOVE ORTHO TXT STRL SZ7.5 (GLOVE) ×4 IMPLANT
GLOVE SURG ORTHO 8.0 STRL STRW (GLOVE) ×1 IMPLANT
GOWN STRL REUS W/TWL LRG LVL3 (GOWN DISPOSABLE) ×2 IMPLANT
GOWN STRL REUS W/TWL XL LVL3 (GOWN DISPOSABLE) ×4 IMPLANT
HANDPIECE INTERPULSE COAX TIP (DISPOSABLE) ×2
KIT BASIN OR (CUSTOM PROCEDURE TRAY) ×2 IMPLANT
MANIFOLD NEPTUNE II (INSTRUMENTS) ×2 IMPLANT
PACK ORTHO EXTREMITY (CUSTOM PROCEDURE TRAY) ×2 IMPLANT
PAD ABD 7.5X8 STRL (GAUZE/BANDAGES/DRESSINGS) ×1 IMPLANT
PAD CAST 4YDX4 CTTN HI CHSV (CAST SUPPLIES) ×1 IMPLANT
PADDING CAST COTTON 4X4 STRL (CAST SUPPLIES)
POSITIONER SURGICAL ARM (MISCELLANEOUS) ×2 IMPLANT
SET HNDPC FAN SPRY TIP SCT (DISPOSABLE) ×1 IMPLANT
SOL PREP PROV IODINE SCRUB 4OZ (MISCELLANEOUS) ×1 IMPLANT
STOCKINETTE 8 INCH (MISCELLANEOUS) ×1 IMPLANT
SUT ETHILON 2 0 PS N (SUTURE) ×1 IMPLANT
SYR CONTROL 10ML LL (SYRINGE) ×1 IMPLANT
TOWEL OR 17X26 10 PK STRL BLUE (TOWEL DISPOSABLE) ×3 IMPLANT

## 2017-07-17 NOTE — Interval H&P Note (Signed)
History and Physical Interval Note:  07/17/2017 4:11 PM  [name]  has presented today for surgery, with the diagnosis of hematoma right leg  The various methods of treatment have been discussed with the patient and family. After consideration of risks, benefits and other options for treatment, the patient has consented to  Procedure(s): EVACUATION RIGHT LEG HEMATOMA WITH WOUND VAC DRESSING CHANGE (Right) as a surgical intervention .  The patient's history has been reviewed, patient examined, no change in status, stable for surgery.  I have reviewed the patient's chart and labs.  Questions were answered to the patient's satisfaction.     Mauri Pole

## 2017-07-17 NOTE — Transfer of Care (Signed)
Immediate Anesthesia Transfer of Care Note  Patient: [name]  Procedure(s) Performed: Procedure(s): EVACUATION RIGHT LEG HEMATOMA WITH WOUND VAC DRESSING CHANGE (Right)  Patient Location: PACU  Anesthesia Type:General  Level of Consciousness:  sedated, patient cooperative and responds to stimulation  Airway & Oxygen Therapy:Patient Spontanous Breathing and Patient connected to face mask oxgen  Post-op Assessment:  Report given to PACU RN and Post -op Vital signs reviewed and stable  Post vital signs:  Reviewed and stable  Last Vitals:  Vitals:   07/17/17 1521 07/17/17 1718  BP: 132/76 129/72  Pulse: 83 86  Resp: 18 17  Temp: 36.4 C (!) 36.2 C  SpO2: 76% 151%    Complications: No apparent anesthesia complications

## 2017-07-17 NOTE — Anesthesia Procedure Notes (Signed)
Procedure Name: LMA Insertion Date/Time: 07/17/2017 4:33 PM Performed by: Anne Fu, CRNA Pre-anesthesia Checklist: Patient identified, Emergency Drugs available, Suction available, Patient being monitored and Timeout performed Patient Re-evaluated:Patient Re-evaluated prior to induction Oxygen Delivery Method: Circle system utilized Preoxygenation: Pre-oxygenation with 100% oxygen Induction Type: IV induction Ventilation: Mask ventilation without difficulty LMA: LMA inserted LMA Size: 4.0 Number of attempts: 1 Placement Confirmation: positive ETCO2 and breath sounds checked- equal and bilateral Tube secured with: Tape

## 2017-07-17 NOTE — H&P (View-Only) (Signed)
     Subjective: 3 Days Post-Op Procedure(s) (LRB): EVACUATION RIGHT LEG HEMATOMA WITH APPLICATION OF WOUND VAC (Right)   Patient reports pain as mild, pain controlled.  Understands that she will go back tot he OR today for repeat I&D and either wound closure vs application of wound vac.  We have discussed that if there is a wound vac, we would have her come to the office twice a week for changes until we can get the area healed or further input from Dr. Marla Roe.  Objective:   VITALS:   Vitals:   07/16/17 1958 07/17/17 0600  BP: 113/60 114/70  Pulse: 83 77  Resp: 17 16  Temp: 98.5 F (36.9 C) 98.3 F (36.8 C)  SpO2: 96% 97%    Dorsiflexion/Plantar flexion intact Incision: scant drainage : wound vac No cellulitis present Compartment soft  LABS Recent Labs    07/15/17 0555 07/16/17 0539 07/17/17 0530  HGB 9.2* 9.0* 9.0*  HCT 28.2* 27.8* 28.0*  WBC 2.6* 4.3 4.2  PLT 157 172 180    Recent Labs    07/15/17 0555 07/16/17 0539 07/17/17 0530  NA 138 140 141  K 3.8 3.7 3.5  BUN 15 14 11   CREATININE 0.56 0.59 0.59  GLUCOSE 114* 93 84     Assessment/Plan: 3 Days Post-Op Procedure(s) (LRB): EVACUATION RIGHT LEG HEMATOMA WITH APPLICATION OF WOUND VAC (Right)   May take morning med with a sip of water.  Clear fluids until 9 AM  NPO until surgery  Surgery later today, scheduled for around 1600 at this time, patient is informed.    West Pugh Jonathen Rathman   PAC  07/17/2017, 8:33 AM

## 2017-07-17 NOTE — Anesthesia Preprocedure Evaluation (Addendum)
Anesthesia Evaluation  Patient identified by MRN, date of birth, ID band Patient awake    Reviewed: Allergy & Precautions, NPO status , Patient's Chart, lab work & pertinent test results  History of Anesthesia Complications (+) PONV and history of anesthetic complications  Airway Mallampati: I  TM Distance: >3 FB Neck ROM: Full    Dental  (+) Teeth Intact   Pulmonary neg pulmonary ROS,    breath sounds clear to auscultation       Cardiovascular  Rhythm:Regular Rate:Normal     Neuro/Psych PSYCHIATRIC DISORDERS Anxiety  Neuromuscular disease    GI/Hepatic negative GI ROS, Neg liver ROS,   Endo/Other  negative endocrine ROS  Renal/GU negative Renal ROS     Musculoskeletal  (+) Arthritis ,   Abdominal   Peds  Hematology  (+) anemia ,   Anesthesia Other Findings   Reproductive/Obstetrics                            Lab Results  Component Value Date   WBC 4.2 07/17/2017   HGB 9.0 (L) 07/17/2017   HCT 28.0 (L) 07/17/2017   MCV 102.2 (H) 07/17/2017   PLT 180 07/17/2017   Lab Results  Component Value Date   CREATININE 0.59 07/17/2017   BUN 11 07/17/2017   NA 141 07/17/2017   K 3.5 07/17/2017   CL 112 (H) 07/17/2017   CO2 21 (L) 07/17/2017   Lab Results  Component Value Date   INR 0.98 07/14/2017   INR 0.94 07/06/2017   INR 1.08 12/12/2016   Echo: - Left ventricle: The cavity size was normal. Systolic function was vigorous. The estimated ejection fraction was in the range of 65% to 70%. Wall motion was normal; there were no regional wall motion abnormalities. Doppler parameters are consistent with abnormal left ventricular relaxation (grade 1 diastolic dysfunction). GLS:-22.6% - Ventricular septum: Septal motion showed &quot;bounce&quot;. The contour   showed diastolic flattening. - Mitral valve: Mild prolapse, involving the anterior leaflet.   There was mild regurgitation. - Left  atrium: The atrium was normal in size. - Tricuspid valve: There was mild regurgitation. - Pulmonary arteries: Systolic pressure was mildly increased. PA   peak pressure: 41 mm Hg (S).  Anesthesia Physical  Anesthesia Plan  ASA: II  Anesthesia Plan: General   Post-op Pain Management:    Induction: Intravenous  PONV Risk Score and Plan: 4 or greater and Ondansetron, Dexamethasone, Scopolamine patch - Pre-op and Midazolam  Airway Management Planned: LMA  Additional Equipment: None  Intra-op Plan:   Post-operative Plan: Extubation in OR  Informed Consent: I have reviewed the patients History and Physical, chart, labs and discussed the procedure including the risks, benefits and alternatives for the proposed anesthesia with the patient or authorized representative who has indicated his/her understanding and acceptance.   Dental advisory given  Plan Discussed with: CRNA and Surgeon  Anesthesia Plan Comments:         Anesthesia Quick Evaluation

## 2017-07-17 NOTE — Progress Notes (Signed)
     Subjective: 3 Days Post-Op Procedure(s) (LRB): EVACUATION RIGHT LEG HEMATOMA WITH APPLICATION OF WOUND VAC (Right)   Patient reports pain as mild, pain controlled.  Understands that she will go back tot he OR today for repeat I&D and either wound closure vs application of wound vac.  We have discussed that if there is a wound vac, we would have her come to the office twice a week for changes until we can get the area healed or further input from Dr. Marla Roe.  Objective:   VITALS:   Vitals:   07/16/17 1958 07/17/17 0600  BP: 113/60 114/70  Pulse: 83 77  Resp: 17 16  Temp: 98.5 F (36.9 C) 98.3 F (36.8 C)  SpO2: 96% 97%    Dorsiflexion/Plantar flexion intact Incision: scant drainage : wound vac No cellulitis present Compartment soft  LABS Recent Labs    07/15/17 0555 07/16/17 0539 07/17/17 0530  HGB 9.2* 9.0* 9.0*  HCT 28.2* 27.8* 28.0*  WBC 2.6* 4.3 4.2  PLT 157 172 180    Recent Labs    07/15/17 0555 07/16/17 0539 07/17/17 0530  NA 138 140 141  K 3.8 3.7 3.5  BUN 15 14 11   CREATININE 0.56 0.59 0.59  GLUCOSE 114* 93 84     Assessment/Plan: 3 Days Post-Op Procedure(s) (LRB): EVACUATION RIGHT LEG HEMATOMA WITH APPLICATION OF WOUND VAC (Right)   May take morning med with a sip of water.  Clear fluids until 9 AM  NPO until surgery  Surgery later today, scheduled for around 1600 at this time, patient is informed.    West Pugh Davianna Deutschman   PAC  07/17/2017, 8:33 AM

## 2017-07-17 NOTE — Anesthesia Postprocedure Evaluation (Signed)
Anesthesia Post Note  Patient: [name]  Procedure(s) Performed: EVACUATION RIGHT LEG HEMATOMA WITH WOUND VAC DRESSING CHANGE (Right Knee)     Patient location during evaluation: PACU Anesthesia Type: General Level of consciousness: awake and alert Pain management: pain level controlled Vital Signs Assessment: post-procedure vital signs reviewed and stable Respiratory status: spontaneous breathing, nonlabored ventilation, respiratory function stable and patient connected to nasal cannula oxygen Cardiovascular status: blood pressure returned to baseline and stable Postop Assessment: no apparent nausea or vomiting Anesthetic complications: no    Last Vitals:  Vitals:   07/17/17 1744 07/17/17 1811  BP: 125/75 125/77  Pulse: 79 81  Resp: 15   Temp: (!) 36.4 C (!) 36.4 C  SpO2: 98% 99%    Last Pain:  Vitals:   07/17/17 1819  TempSrc:   PainSc: [place]

## 2017-07-17 NOTE — Brief Op Note (Signed)
07/14/2017 - 07/17/2017  4:15 PM  PATIENT:  [name]  62 y.o. female  PRE-OPERATIVE DIAGNOSIS:  hematoma right leg  POST-OPERATIVE DIAGNOSIS:   hematoma right leg compromising wound healing  PROCEDURE:  Procedure(s): EVACUATION RIGHT LEG HEMATOMA WITH WOUND VAC DRESSING CHANGE (Right)  SURGEON:  Surgeon(s) and Role:    Paralee Cancel, MD - Primary  PHYSICIAN ASSISTANT: None  ANESTHESIA:   general  EBL: Minimal  BLOOD ADMINISTERED:none  DRAINS: none   LOCAL MEDICATIONS USED:  NONE  SPECIMEN:  No Specimen  DISPOSITION OF SPECIMEN:  N/A  COUNTS:  YES  TOURNIQUET:  None utilized   DICTATION: .Other Dictation: Dictation Number [id number]  PLAN OF CARE: Admit to inpatient   PATIENT DISPOSITION:  PACU - hemodynamically stable.   Delay start of Pharmacological VTE agent (>24hrs) due to surgical blood loss or risk of bleeding: no

## 2017-07-17 NOTE — Progress Notes (Signed)
Agree with preceding notes  I have reviewed with the Goins the concerns I have and the surgical and non surgical plans I have touched base with Dr. Marla Roe about evaluating her wound at some point to make sure all options are being considered.

## 2017-07-17 NOTE — Progress Notes (Signed)
Spoke with surgeon, he states plan for repeat I&D today with reapplication of wound vac. He plans to bring her to the office for changes twice per week. He requested delivery of equipment to his office. Contacted [employer] Rep, Luberta Mutter at [phone], faxed clinicals and signed script to him at [phone]. Awaiting insurance auth then will arrange delivery plans. [phone]

## 2017-07-18 ENCOUNTER — Encounter (HOSPITAL_COMMUNITY): Payer: Self-pay | Admitting: Orthopedic Surgery

## 2017-07-18 LAB — CBC
HCT: 30.9 % — ABNORMAL LOW (ref 36.0–46.0)
HEMOGLOBIN: 9.9 g/dL — AB (ref 12.0–15.0)
MCH: 32.4 pg (ref 26.0–34.0)
MCHC: 32 g/dL (ref 30.0–36.0)
MCV: 101 fL — ABNORMAL HIGH (ref 78.0–100.0)
PLATELETS: 224 10*3/uL (ref 150–400)
RBC: 3.06 MIL/uL — AB (ref 3.87–5.11)
RDW: 16.9 % — ABNORMAL HIGH (ref 11.5–15.5)
WBC: 3.4 10*3/uL — AB (ref 4.0–10.5)

## 2017-07-18 LAB — BASIC METABOLIC PANEL
Anion gap: 9 (ref 5–15)
BUN: 12 mg/dL (ref 6–20)
CHLORIDE: 104 mmol/L (ref 101–111)
CO2: 24 mmol/L (ref 22–32)
Calcium: 8.6 mg/dL — ABNORMAL LOW (ref 8.9–10.3)
Creatinine, Ser: 0.59 mg/dL (ref 0.44–1.00)
Glucose, Bld: 136 mg/dL — ABNORMAL HIGH (ref 65–99)
POTASSIUM: 3.8 mmol/L (ref 3.5–5.1)
SODIUM: 137 mmol/L (ref 135–145)

## 2017-07-18 NOTE — Progress Notes (Signed)
Subjective: 1 Day Post-Op Procedure(s) (LRB): EVACUATION RIGHT LEG HEMATOMA WITH WOUND VAC DRESSING CHANGE (Right) Patient reports pain as mild.   Seen by myself and Dr. Gladstone Lighter in rounds. No c/o this AM.  Objective: Vital signs in last 24 hours: Temp:  [96.8 F (36 C)-98.2 F (36.8 C)] 97.9 F (36.6 C) (02/23 0600) Pulse Rate:  [75-93] 75 (02/23 0600) Resp:  [14-18] 16 (02/23 0600) BP: (108-138)/(59-77) 110/65 (02/23 0600) SpO2:  [93 %-100 %] 96 % (02/23 0600)  Intake/Output from previous day: 02/22 0701 - 02/23 0700 In: 1960 [P.O.:60; I.V.:1900] Out: 10 [Blood:10] Intake/Output this shift: No intake/output data recorded.  Recent Labs    07/16/17 0539 07/17/17 0530 07/17/17 1854 07/18/17 0619  HGB 9.0* 9.0* 10.4* 9.9*   Recent Labs    07/17/17 1854 07/18/17 0619  WBC 3.8* 3.4*  RBC 3.10* 3.06*  HCT 31.5* 30.9*  PLT 204 224   Recent Labs    07/17/17 0530 07/17/17 1854 07/18/17 0619  NA 141  --  137  K 3.5  --  3.8  CL 112*  --  104  CO2 21*  --  24  BUN 11  --  12  CREATININE 0.59 0.67 0.59  GLUCOSE 84  --  136*  CALCIUM 8.5*  --  8.6*   No results for input(s): LABPT, INR in the last 72 hours.  Neurologically intact ABD soft Neurovascular intact Sensation intact distally Intact pulses distally Dorsiflexion/Plantar flexion intact Incision: dressing C/D/I and no drainage No cellulitis present Compartment soft no calf pain   Assessment/Plan: 1 Day Post-Op Procedure(s) (LRB): EVACUATION RIGHT LEG HEMATOMA WITH WOUND VAC DRESSING CHANGE (Right) Advance diet Up with therapy D/C IV fluids  D/C home today with portable prevena vac- Needs portable unit prior to D/C Follow up Monday with Dr. Alvan Dame in office  [place], Ellison Hughs M. 07/18/2017, 8:48 AM

## 2017-07-18 NOTE — Progress Notes (Signed)
Pt is discharged to home. Dc instructions given with husband and dtr at bedside. No concerns voiced. Prescriptions for pt not available at time of discharge. Hinton Dyer, rn contacted Kennyth Lose (Provider on call), who supposed to arrange for prescriptions to be sent to pharmacy and pt made aware to pick up med from pharmacy. Pt left unit in wheelchair pushed by nurse tech accompanied by husband and dtr. Left in stable condition. Hale Bogus.

## 2017-07-18 NOTE — Progress Notes (Signed)
Discharge planning: Pt has RW and 3n1 at home. [employer] delivered wound vac. Scheduled dc home today. Spoke to pt and has family at home to assist with care. She will be going to surgeon's office for care and dressing changes of wound vac. Jonnie Finner RN CCM Case Mgmt phone [phone]

## 2017-07-18 NOTE — Discharge Summary (Signed)
Patient ID: [name] MRN: [id number] DOB/AGE: [birth year] 62 y.o.  Admit date: 07/14/2017 Discharge date: 07/18/2017  Admission Diagnoses:  Active Problems:   Wound, open with complication   Discharge Diagnoses:  Same  Past Medical History:  Diagnosis Date  . 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 Allergic reaction 07/24/2016  . Anemia    needed transfusion spring of 2018  . Arthritis KNEES   right knee, hx. past left knee replacemnt.  . Blood clot in vein    around Portacath right chest-tx. Eliquis about a yr., prior Lovenox.  . Blood dyscrasia   . Chronic anticoagulation HX  PORT-A-CATH CLOT   2010 - HAS BEEN ON LOVENOX SINCE THE CLOT  . Drug rash 07/24/2016  . Elevated blood pressure reading without diagnosis of hypertension    PT MONITORS HER B/P AT HOME  . Heart murmur   . Hematoma of leg, right, sequela   . 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 PONV (postoperative nausea and vomiting)    ponv likes scopolamine patch  . Port-A-Cath in place    RIGHT CHEST   . 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 Skin changes related to chemotherapy    per patient she has scabbed over skin circumventing port to r ight upper chest ; denie drainage and state it is due to chemptherapy   . Status post skin flap graft    right chest wall with metastatis right anterior chest -no drainage or open wound.    Surgeries: Procedure(s): EVACUATION RIGHT LEG HEMATOMA WITH WOUND VAC DRESSING CHANGE on 07/17/2017   Consultants:   Discharged Condition: Improved  Hospital Course: [name] is an 62 y.o. female who was admitted 07/14/2017 for operative treatment of<principal problem not specified>.  Patient has severe unremitting pain that affects sleep, daily activities, and work/hobbies. After pre-op clearance the patient was taken to the operating room on 07/17/2017 and underwent  Procedure(s): EVACUATION RIGHT LEG HEMATOMA WITH WOUND VAC DRESSING CHANGE.    Patient was given perioperative antibiotics:  Anti-infectives (From admission, onward)   Start     Dose/Rate Route Frequency Ordered Stop   07/18/17 0500  vancomycin (VANCOCIN) IVPB 1000 mg/200 mL premix     1,000 mg 200 mL/hr over 60 Minutes Intravenous Every 12 hours 07/17/17 1813 07/18/17 0614   07/17/17 1648  vancomycin (VANCOCIN) 1-5 GM/200ML-% IVPB    Comments:  Maurice Small   : cabinet override      07/17/17 1648 07/18/17 0459   07/15/17 0145  vancomycin (VANCOCIN) IVPB 1000 mg/200 mL premix     1,000 mg 200 mL/hr over 60 Minutes Intravenous Every 12 hours 07/14/17 1557 07/15/17 0500   07/14/17 2200  doxycycline (VIBRA-TABS) tablet 100 mg     100 mg Oral 2 times daily 07/14/17 1557     07/14/17 1140  vancomycin (VANCOCIN) 1-5 GM/200ML-% IVPB    Comments:  Waldron Session   : cabinet override      07/14/17 1140 07/14/17 1345   07/14/17 1133  vancomycin (VANCOCIN) IVPB 1000 mg/200 mL premix     1,000 mg 200 mL/hr over 60 Minutes Intravenous On  call to O.R. 07/14/17 1133 07/14/17 1445       Patient was given sequential compression devices, early ambulation, and chemoprophylaxis to prevent DVT.  Patient benefited maximally from hospital stay and there were no complications.    Recent vital signs:  Patient Vitals for the past 24 hrs:  BP Temp Temp src Pulse Resp SpO2  07/18/17 0600 110/65 97.9 F (36.6 C) Oral 75 16 96 %  07/18/17 0205 112/65 (!) 96.8 F (36 C) Axillary 83 14 94 %  07/17/17 2250 114/61 98.1 F (36.7 C) Oral 93 16 96 %  07/17/17 2150 115/61 98.2 F (36.8 C) Oral 87 16 95 %  07/17/17 1950 (!) 108/59 98.2 F (36.8 C) Oral 92 16 93 %  07/17/17 1811 125/77 (!) 97.5 F (36.4 C) - 81 - 99 %   07/17/17 1744 125/75 (!) 97.5 F (36.4 C) - 79 15 98 %  07/17/17 1733 - - - - - 100 %  07/17/17 1730 129/71 - - 77 15 100 %  07/17/17 1718 129/72 (!) 97.1 F (36.2 C) - 86 17 100 %  07/17/17 1521 132/76 97.6 F (36.4 C) Oral 83 18 94 %  07/17/17 1433 138/69 98.2 F (36.8 C) Oral 88 17 95 %     Recent laboratory studies:  Recent Labs    07/17/17 0530 07/17/17 1854 07/18/17 0619  WBC 4.2 3.8* 3.4*  HGB 9.0* 10.4* 9.9*  HCT 28.0* 31.5* 30.9*  PLT 180 204 224  NA 141  --  137  K 3.5  --  3.8  CL 112*  --  104  CO2 21*  --  24  BUN 11  --  12  CREATININE 0.59 0.67 0.59  GLUCOSE 84  --  136*  CALCIUM 8.5*  --  8.6*     Discharge Medications:   Allergies as of 07/18/2017      Reactions   Penicillins Hives, Itching, Rash   Has patient had a PCN reaction causing immediate rash, facial/tongue/throat swelling, SOB or lightheadedness with hypotension: yes Has patient had a PCN reaction causing severe rash involving mucus membranes or skin necrosis: No Has patient had a PCN reaction that required hospitalization No Has patient had a PCN reaction occurring within the last 10 years: yes If all of the above answers are "NO", then may proceed with Cephalosporin use. Denies airway involvement   Other Rash   STERI STRIPS - Blisters   Tape Hives   Can tolerate paper tape      Medication List    STOP taking these medications   celecoxib 200 MG capsule Commonly known as:  CELEBREX   furosemide 20 MG tablet Commonly known as:  LASIX     TAKE these medications   acetaminophen 500 MG tablet Commonly known as:  TYLENOL Take 500 mg by mouth every 8 (eight) hours as needed for mild pain or headache.   cetirizine 10 MG tablet Commonly known as:  ZYRTEC Take 1 tablet (10 mg total) daily by mouth.   darifenacin 15 MG 24 hr tablet Commonly known as:  ENABLEX Take 15 mg by mouth daily.   docusate sodium 100 MG capsule Commonly known as:  COLACE Take 1 capsule (100 mg total)  by mouth 2 (two) times daily.   doxycycline 100 MG tablet Commonly known as:  VIBRA-TABS Take 100 mg by mouth 2 (two) times daily. 6 month course started 05-2017   enoxaparin 30 MG/0.3ML injection Commonly known as:  LOVENOX Inject 0.74  mLs (74 mg total) into the skin daily for 14 days. What changed:    medication strength  how much to take   ferrous sulfate 325 (65 FE) MG tablet Commonly known as:  FERROUSUL Take 1 tablet (325 mg total) by mouth 3 (three) times daily with meals.   gabapentin 300 MG capsule Commonly known as:  NEURONTIN TAKE 1 CAPSULE BY MOUTH 3 TIMES DAILY What changed:    how much to take  how to take this  when to take this   KADCYLA IV Inject into the vein every 21 ( twenty-one) days. At [facility] LAST TREATMENT WAS 07/07/2017   methocarbamol 500 MG tablet Commonly known as:  ROBAXIN Take 1 tablet (500 mg total) by mouth every 6 (six) hours as needed for muscle spasms.   multivitamin with minerals Tabs tablet Take 1 tablet by mouth daily.   ondansetron 8 MG tablet Commonly known as:  ZOFRAN Take 1 tablet (8 mg total) by mouth every 8 (eight) hours as needed for nausea. What changed:    reasons to take this  additional instructions   oxyCODONE 5 MG immediate release tablet Commonly known as:  Oxy IR/ROXICODONE Take 1-2 tablets (5-10 mg total) by mouth every 4 (four) hours as needed for moderate pain ((score 4 to 6)). What changed:    how much to take  reasons to take this   pertuzumab in sodium chloride 0.9 % 250 mL Inject into the vein every 21 ( twenty-one) days. Infused at [facility] every 21 days next treatment 07/28/17   polyethylene glycol packet Commonly known as:  MIRALAX / GLYCOLAX Take 17 g by mouth 2 (two) times daily. What changed:    when to take this  reasons to take this   Vitamin D3 2000 units Tabs Take 2,000 Units by mouth 4 (four) times a week.   zolpidem 5 MG tablet Commonly known as:  AMBIEN TAKE 1 TABLET BY MOUTH  ONCE DAILY AT BEDTIME AS NEEDED FOR SLEEP       Diagnostic Studies: No results found.  Disposition: 01-Home or Self Care  Discharge Instructions    Call MD / Call 911   Complete by:  As directed    If you experience chest pain or shortness of breath, CALL 911 and be transported to the hospital emergency room.  If you develope a fever above 101 F, pus (white drainage) or increased drainage or redness at the wound, or calf pain, call your surgeon's office.   Constipation Prevention   Complete by:  As directed    Drink plenty of fluids.  Prune juice may be helpful.  You may use a stool softener, such as Colace (over the counter) 100 mg twice a day.  Use MiraLax (over the counter) for constipation as needed.   Diet - low sodium heart healthy   Complete by:  As directed    Increase activity slowly as tolerated   Complete by:  As directed       Follow-up Information    Paralee Cancel, MD On 07/20/2017.   Specialty:  Orthopedic Surgery Why:  Appointment arranged for 3:30. Contact information: [address] [place] [address] [phone]            Signed: Cecilie Kicks 07/18/2017, 8:50 AM  Patient ID: [name] MRN: [id number] DOB/AGE: Jun 11, 1955 62 y.o.  Admit date: 07/14/2017 Discharge date: 07/18/2017  Admission Diagnoses:  Active Problems:   Wound, open with complication   Discharge Diagnoses:  Same  Past Medical History:  Diagnosis Date  . 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 Allergic reaction 07/24/2016  . Anemia    needed transfusion spring of 2018  . Arthritis KNEES   right knee, hx. past left knee replacemnt.  . Blood clot in vein    around Portacath right chest-tx. Eliquis about a yr., prior Lovenox.  . Blood dyscrasia   . Chronic anticoagulation HX  PORT-A-CATH CLOT   2010 - HAS BEEN ON LOVENOX SINCE THE CLOT  . Drug rash 07/24/2016  . Elevated blood pressure reading without diagnosis of hypertension    PT MONITORS  HER B/P AT HOME  . Heart murmur   . Hematoma of leg, right, sequela   . 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 PONV (postoperative nausea and vomiting)    ponv likes scopolamine patch  . Port-A-Cath in place    RIGHT CHEST   . 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, last chemo july 3rd  . Skin changes related to chemotherapy    per patient she has scabbed over skin circumventing port to r ight upper chest ; denie drainage and state it is due to chemptherapy   . Status post skin flap graft    right chest wall with metastatis right anterior chest -no drainage or open wound.    Surgeries: Procedure(s): EVACUATION RIGHT LEG HEMATOMA WITH WOUND VAC DRESSING CHANGE on 07/17/2017   Consultants:   Discharged Condition: Improved  Hospital Course: [name] is an 62 y.o. female who was admitted 07/14/2017 for operative treatment of<principal problem not specified>. Patient has severe unremitting pain that affects sleep, daily activities, and work/hobbies. After pre-op clearance the patient was taken to the operating room on 07/17/2017 and underwent  Procedure(s): EVACUATION RIGHT LEG HEMATOMA WITH WOUND VAC DRESSING CHANGE.    Patient was given perioperative antibiotics:  Anti-infectives (From admission, onward)   Start     Dose/Rate Route Frequency Ordered Stop   07/18/17 0500  vancomycin (VANCOCIN) IVPB 1000 mg/200 mL premix     1,000 mg 200 mL/hr over 60 Minutes Intravenous Every 12 hours 07/17/17 1813 07/18/17 0614   07/17/17 1648  vancomycin (VANCOCIN) 1-5 GM/200ML-% IVPB    Comments:  Maurice Small   : cabinet override      07/17/17 1648 07/18/17 0459   07/15/17 0145  vancomycin (VANCOCIN) IVPB 1000 mg/200 mL premix      1,000 mg 200 mL/hr over 60 Minutes Intravenous Every 12 hours 07/14/17 1557 07/15/17 0500   07/14/17 2200  doxycycline (VIBRA-TABS) tablet 100 mg     100 mg Oral 2 times daily 07/14/17 1557     07/14/17 1140  vancomycin (VANCOCIN) 1-5 GM/200ML-% IVPB    Comments:  Whitlow, Cheryl   : cabinet override      07/14/17 1140 07/14/17 1345   07/14/17 1133  vancomycin (VANCOCIN) IVPB 1000 mg/200 mL premix     1,000 mg 200 mL/hr over 60 Minutes Intravenous On call to O.R. 07/14/17 1133 07/14/17 1445       Patient was given sequential compression devices, early ambulation, and chemoprophylaxis to prevent DVT.  Patient benefited maximally from hospital stay and  there were no complications.    Recent vital signs:  Patient Vitals for the past 24 hrs:  BP Temp Temp src Pulse Resp SpO2  07/18/17 0600 110/65 97.9 F (36.6 C) Oral 75 16 96 %  07/18/17 0205 112/65 (!) 96.8 F (36 C) Axillary 83 14 94 %  07/17/17 2250 114/61 98.1 F (36.7 C) Oral 93 16 96 %  07/17/17 2150 115/61 98.2 F (36.8 C) Oral 87 16 95 %  07/17/17 1950 (!) 108/59 98.2 F (36.8 C) Oral 92 16 93 %  07/17/17 1811 125/77 (!) 97.5 F (36.4 C) - 81 - 99 %  07/17/17 1744 125/75 (!) 97.5 F (36.4 C) - 79 15 98 %  07/17/17 1733 - - - - - 100 %  07/17/17 1730 129/71 - - 77 15 100 %  07/17/17 1718 129/72 (!) 97.1 F (36.2 C) - 86 17 100 %  07/17/17 1521 132/76 97.6 F (36.4 C) Oral 83 18 94 %  07/17/17 1433 138/69 98.2 F (36.8 C) Oral 88 17 95 %     Recent laboratory studies:  Recent Labs    07/17/17 0530 07/17/17 1854 07/18/17 0619  WBC 4.2 3.8* 3.4*  HGB 9.0* 10.4* 9.9*  HCT 28.0* 31.5* 30.9*  PLT 180 204 224  NA 141  --  137  K 3.5  --  3.8  CL 112*  --  104  CO2 21*  --  24  BUN 11  --  12  CREATININE 0.59 0.67 0.59  GLUCOSE 84  --  136*  CALCIUM 8.5*  --  8.6*     Discharge Medications:   Allergies as of 07/18/2017      Reactions   Penicillins Hives, Itching, Rash   Has patient had a PCN reaction  causing immediate rash, facial/tongue/throat swelling, SOB or lightheadedness with hypotension: yes Has patient had a PCN reaction causing severe rash involving mucus membranes or skin necrosis: No Has patient had a PCN reaction that required hospitalization No Has patient had a PCN reaction occurring within the last 10 years: yes If all of the above answers are "NO", then may proceed with Cephalosporin use. Denies airway involvement   Other Rash   STERI STRIPS - Blisters   Tape Hives   Can tolerate paper tape      Medication List    STOP taking these medications   celecoxib 200 MG capsule Commonly known as:  CELEBREX   furosemide 20 MG tablet Commonly known as:  LASIX     TAKE these medications   acetaminophen 500 MG tablet Commonly known as:  TYLENOL Take 500 mg by mouth every 8 (eight) hours as needed for mild pain or headache.   cetirizine 10 MG tablet Commonly known as:  ZYRTEC Take 1 tablet (10 mg total) daily by mouth.   darifenacin 15 MG 24 hr tablet Commonly known as:  ENABLEX Take 15 mg by mouth daily.   docusate sodium 100 MG capsule Commonly known as:  COLACE Take 1 capsule (100 mg total) by mouth 2 (two) times daily.   doxycycline 100 MG tablet Commonly known as:  VIBRA-TABS Take 100 mg by mouth 2 (two) times daily. 6 month course started 05-2017   enoxaparin 30 MG/0.3ML injection Commonly known as:  LOVENOX Inject 0.74 mLs (74 mg total) into the skin daily for 14 days. What changed:    medication strength  how much to take   ferrous sulfate 325 (65 FE) MG tablet Commonly known as:  FERROUSUL Take 1 tablet (325 mg total) by mouth 3 (three) times daily with meals.   gabapentin 300 MG capsule Commonly known as:  NEURONTIN TAKE 1 CAPSULE BY MOUTH 3 TIMES DAILY What changed:    how much to take  how to take this  when to take this   KADCYLA IV Inject into the vein every 21 ( twenty-one) days. At [facility] LAST TREATMENT WAS 07/07/2017    methocarbamol 500 MG tablet Commonly known as:  ROBAXIN Take 1 tablet (500 mg total) by mouth every 6 (six) hours as needed for muscle spasms.   multivitamin with minerals Tabs tablet Take 1 tablet by mouth daily.   ondansetron 8 MG tablet Commonly known as:  ZOFRAN Take 1 tablet (8 mg total) by mouth every 8 (eight) hours as needed for nausea. What changed:    reasons to take this  additional instructions   oxyCODONE 5 MG immediate release tablet Commonly known as:  Oxy IR/ROXICODONE Take 1-2 tablets (5-10 mg total) by mouth every 4 (four) hours as needed for moderate pain ((score 4 to 6)). What changed:    how much to take  reasons to take this   pertuzumab in sodium chloride 0.9 % 250 mL Inject into the vein every 21 ( twenty-one) days. Infused at [facility] every 21 days next treatment 07/28/17   polyethylene glycol packet Commonly known as:  MIRALAX / GLYCOLAX Take 17 g by mouth 2 (two) times daily. What changed:    when to take this  reasons to take this   Vitamin D3 2000 units Tabs Take 2,000 Units by mouth 4 (four) times a week.   zolpidem 5 MG tablet Commonly known as:  AMBIEN TAKE 1 TABLET BY MOUTH ONCE DAILY AT BEDTIME AS NEEDED FOR SLEEP       Diagnostic Studies: No results found.  Disposition: 01-Home or Self Care  Discharge Instructions    Call MD / Call 911   Complete by:  As directed    If you experience chest pain or shortness of breath, CALL 911 and be transported to the hospital emergency room.  If you develope a fever above 101 F, pus (white drainage) or increased drainage or redness at the wound, or calf pain, call your surgeon's office.   Constipation Prevention   Complete by:  As directed    Drink plenty of fluids.  Prune juice may be helpful.  You may use a stool softener, such as Colace (over the counter) 100 mg twice a day.  Use MiraLax (over the counter) for constipation as needed.   Diet - low sodium heart healthy   Complete by:  As  directed    Increase activity slowly as tolerated   Complete by:  As directed       Follow-up Information    Paralee Cancel, MD On 07/20/2017.   Specialty:  Orthopedic Surgery Why:  Appointment arranged for 3:30. Contact information: [address] [place] [address] [phone]            Signed: Cecilie Kicks. 07/18/2017, 8:50 AM

## 2017-07-20 NOTE — Op Note (Signed)
NAME:  [name], [name]                       ACCOUNT NO.:  MEDICAL RECORD NO.:  [id number]  LOCATION:                                 FACILITY:  PHYSICIAN:  Pietro Cassis. Alvan Dame, M.D.  DATE OF BIRTH:  15-Feb-1956  DATE OF PROCEDURE: DATE OF DISCHARGE:                              OPERATIVE REPORT   PREOPERATIVE DIAGNOSIS:  Right leg hematoma compromising wound healing following reimplantation of right knee.  POSTOPERATIVE DIAGNOSES: 1. Right hematoma, right leg compromising wound healing. 2. Right knee joint effusion.  PROCEDURES: 1. Removal of right leg wound VAC with nonexcisional debridement of     right leg wound with 1500 cc of normal saline solution. 2. Placement of small wound VAC in the wound. 3. Primary partial closure of wound. 4. Aspiration of 60 cc of hemarthrosis from right knee joint.  SURGEON:  Pietro Cassis. Alvan Dame, M.D.  ASSISTANT:  Surgical team.  ANESTHESIA:  General.  SPECIMENS:  None.  COMPLICATION:  None.  INDICATION FOR PROCEDURE:  Ms. Zupko is a 62 year old female, status post reimplantation of right total knee.  She had significant skin issues in the preoperative time.  Her postoperative healing went well until the development of subcutaneous hematoma related to the utilization of Lovenox for DVT prophylaxis in the setting of comorbid cancer.  We tried to aspirate this in the office, however, it recurred.  She was subsequently taken to the operating room for evacuation of hematoma; however, her skin continued to be significantly compromised and weakened.  I made a decision this week to admit her to the hospital and do I and D and placement of wound VAC and see if we can get this to heal secondarily through a more clean or sterile technique.  Risks and benefits have been outlined.  She is here today for a repeat evaluation and see if I can get any of the wound closed as well as VAC change. Consent obtained.  PROCEDURE IN DETAIL:  The patient was brought to  the operative theater. Once adequate anesthesia, perioperative antibiotics, vancomycin, given that she was already on doxycycline, her right leg was prepped and draped in sterile fashion.  The time-out was performed identifying the patient, planned procedure and extremity.  Her old wound VAC was removed.  The wound bed had a nice granulating bed.  There were no signs of infection.  I irrigated the wound out with 1500 cc of normal saline solution with pulse lavage.  During this, I palpated her knee joint, found there to be an effusion. Given the concern for infection in the past, I did aspirate the knee and found that she had a hemarthrosis inside the joint that I removed.  This decompressed the knee joint well.  At this point, I was able to place three 2-0 nylon sutures in a vertical mattress fashion in the distal portion of her incision closing this down a little bit.  I then trimmed down a small wound VAC sponge and placed it into the upper portion.  After cleaning her leg off, I re-dressed the wound VAC and applied it to 75 mmHg pressure.  Subsequently, we wrapped the right  knee in an Ace wrap.  She was then brought to the recovery room in stable condition, tolerated the procedure well, findings were reviewed with her husband.  Plan on her being discharge tomorrow with wound VAC change in the office to try to doing this cleanly as possible as outlined and the plan with her.     Pietro Cassis Alvan Dame, M.D.     MDO/MEDQ  D:  07/17/2017  T:  07/18/2017  Job:  [id number]

## 2017-07-22 ENCOUNTER — Encounter: Payer: Self-pay | Admitting: *Deleted

## 2017-07-22 ENCOUNTER — Other Ambulatory Visit: Payer: Self-pay | Admitting: *Deleted

## 2017-07-22 NOTE — Patient Outreach (Addendum)
[facility]) Care Management  07/22/2017  [name] [birth year] [id number]   Subjective: Telephone call to patient's home number, spoke with patient, and HIPAA verified.  Discussed [facility] Care Management UMR Transition of care follow up, patient voiced understanding, and is in agreement to follow up.   Patient states she remember speaking with this RNCM in the past, has completed physical therapy and antibiotics discussed during our last conversation.  Patient states she is doing well, recovering without difficulty from latest surgery, and has a follow up appointment with surgeon's office every Monday and Thursday to change out wound vac.  Patient states she is able to manage self care and has assistance as needed with activities of daily living / home management.   Patient voices understanding of medical diagnosis, surgery,  and treatment plan.  States she is accessing the following [facility] benefits: outpatient pharmacy, hospital indemnity (not a chosen benefit), and has long term disability in place.  Patient states she does not have any education material, transition of care, care coordination, disease management, disease monitoring, transportation, community resource, or pharmacy needs at this time.  States she is very appreciative of the follow up and is in agreement to receive [facility] information.      Objective:Per KPN (Knowledge Performance Now, point of care tool) and chart review,patient hospitalized 07/14/17 - 07/16/23 for wound complication, Right leg hematoma compromising wound healing following reimplantation of right knee.  Status post Removal of right leg wound VAC with nonexcisional debridement of right leg wound with 1500 cc of normal saline solution,  Placement of small wound VAC in the wound, Primary partial closure of wound, and Aspiration of 60 cc of hemarthrosis from right knee joint.     Patient hospitalized 04/27/17 -04/30/17 forStatus post infection and  resecton of right total knee arthroplasty.Status post Revision/reimplantation of right total knee replacement utilizing components from [employer] knee system with a size 3 TC3 femur, 4 mm distal medial augment, 8 mm distal lateral augment, 4 mm posterior medial augment and +2 bolt, 5-degree adapter with a 13 x 60 mm cemented stemThe tibial tray with a size 3 MBT revision tray with 5 mm medial and lateral augments, a size 29 cemented sleeve and a 13 x 30 cemented stem. We used the size 15 posterior stabilized insert to match the size 3 femur and a 38 patellar buttonon 04/27/17. Patient hospitalized 12/12/16 - 12/19/16 for anemia.Patient hospitalized 12/02/16 -12/05/16 for Infected right total knee arthroplasty. Status post EXCISIONAL TOTAL KNEE ARTHROPLASTY WITH ANTIBIOTIC SPACERS on 12/02/16. Status post Resection of right total knee arthroplasty, placement of antibiotic articulating spacer on 12/03/16. Patient also hospitalized 03/20/16 -03/23/16 for End-stage osteoarthrosis, valgus deformity of the right knee.Status post Right total knee arthroplasty on 03/20/16. Patient has a history of BREAST CANCER PRIMARY W/ METS TO CHEST WALL SKIN CANCER-- CHEMOTHERAPY EVERY 3 WEEKS AT [facility].  [facility] Care Management transition of care and benefit exceptionfollow up completed on 05/01/17.     Assessment: Received UMRTransition of care referral on 07/15/17.   Transition of care follow up completed, no care management needs,  and will proceed with case closure.         Plan: RNCM will send patient successful outreach letter, [facility] pamphlet, and magnet. RNCM will send case closure due to follow up completed / no care management needs request to [facility] [place] [facility].     Jaque Dacy H. Burt Knack Therapist, sports, BSN, CCM [facility] Care Management [facility] Telephonic CM  Phone: [phone] Fax: [phone]

## 2017-07-23 MED FILL — DARIFENACIN ER 15 MG TABLET: 15 | 30 days supply | Qty: 30 | Fill #7

## 2017-07-27 DIAGNOSIS — T8131XD Disruption of external operation (surgical) wound, not elsewhere classified, subsequent encounter: Secondary | ICD-10-CM | POA: Diagnosis not present

## 2017-07-27 DIAGNOSIS — C792 Secondary malignant neoplasm of skin: Secondary | ICD-10-CM | POA: Diagnosis not present

## 2017-07-27 DIAGNOSIS — C50911 Malignant neoplasm of unspecified site of right female breast: Secondary | ICD-10-CM | POA: Diagnosis not present

## 2017-07-27 DIAGNOSIS — S21109A Unspecified open wound of unspecified front wall of thorax without penetration into thoracic cavity, initial encounter: Secondary | ICD-10-CM | POA: Diagnosis not present

## 2017-07-28 DIAGNOSIS — Z9889 Other specified postprocedural states: Secondary | ICD-10-CM | POA: Diagnosis not present

## 2017-07-28 DIAGNOSIS — C792 Secondary malignant neoplasm of skin: Secondary | ICD-10-CM | POA: Diagnosis not present

## 2017-07-28 DIAGNOSIS — Z7901 Long term (current) use of anticoagulants: Secondary | ICD-10-CM | POA: Diagnosis not present

## 2017-07-28 DIAGNOSIS — I5020 Unspecified systolic (congestive) heart failure: Secondary | ICD-10-CM | POA: Diagnosis not present

## 2017-07-28 DIAGNOSIS — D638 Anemia in other chronic diseases classified elsewhere: Secondary | ICD-10-CM | POA: Diagnosis not present

## 2017-07-28 DIAGNOSIS — Z79899 Other long term (current) drug therapy: Secondary | ICD-10-CM | POA: Diagnosis not present

## 2017-07-28 DIAGNOSIS — Z5112 Encounter for antineoplastic immunotherapy: Secondary | ICD-10-CM | POA: Diagnosis not present

## 2017-07-28 DIAGNOSIS — C50412 Malignant neoplasm of upper-outer quadrant of left female breast: Secondary | ICD-10-CM | POA: Diagnosis not present

## 2017-07-28 MED FILL — ENOXAPARIN 30 MG/0.3 ML SYR: 30 | 20 days supply | Qty: 6 | Fill #0

## 2017-07-29 MED FILL — GABAPENTIN 300 MG CAPSULE: 300 | 30 days supply | Qty: 150 | Fill #0

## 2017-08-03 DIAGNOSIS — C792 Secondary malignant neoplasm of skin: Secondary | ICD-10-CM | POA: Diagnosis not present

## 2017-08-03 DIAGNOSIS — C50911 Malignant neoplasm of unspecified site of right female breast: Secondary | ICD-10-CM | POA: Diagnosis not present

## 2017-08-03 DIAGNOSIS — T8189XD Other complications of procedures, not elsewhere classified, subsequent encounter: Secondary | ICD-10-CM | POA: Diagnosis not present

## 2017-08-04 DIAGNOSIS — C761 Malignant neoplasm of thorax: Secondary | ICD-10-CM | POA: Diagnosis not present

## 2017-08-12 MED FILL — DOXYCYCLINE MONO 100 MG CAP: 100 | 30 days supply | Qty: 60 | Fill #2

## 2017-08-17 DIAGNOSIS — T8189XD Other complications of procedures, not elsewhere classified, subsequent encounter: Secondary | ICD-10-CM | POA: Diagnosis not present

## 2017-08-17 DIAGNOSIS — C50911 Malignant neoplasm of unspecified site of right female breast: Secondary | ICD-10-CM | POA: Diagnosis not present

## 2017-08-17 DIAGNOSIS — T8189XA Other complications of procedures, not elsewhere classified, initial encounter: Secondary | ICD-10-CM | POA: Diagnosis not present

## 2017-08-17 DIAGNOSIS — C792 Secondary malignant neoplasm of skin: Secondary | ICD-10-CM | POA: Diagnosis not present

## 2017-08-18 DIAGNOSIS — C792 Secondary malignant neoplasm of skin: Secondary | ICD-10-CM | POA: Diagnosis not present

## 2017-08-18 DIAGNOSIS — R918 Other nonspecific abnormal finding of lung field: Secondary | ICD-10-CM | POA: Diagnosis not present

## 2017-08-18 DIAGNOSIS — Z171 Estrogen receptor negative status [ER-]: Secondary | ICD-10-CM | POA: Diagnosis not present

## 2017-08-18 DIAGNOSIS — R531 Weakness: Secondary | ICD-10-CM | POA: Diagnosis not present

## 2017-08-18 DIAGNOSIS — Z86718 Personal history of other venous thrombosis and embolism: Secondary | ICD-10-CM | POA: Diagnosis not present

## 2017-08-18 DIAGNOSIS — C50912 Malignant neoplasm of unspecified site of left female breast: Secondary | ICD-10-CM | POA: Diagnosis not present

## 2017-08-18 DIAGNOSIS — T86821 Skin graft (allograft) (autograft) failure: Secondary | ICD-10-CM | POA: Diagnosis not present

## 2017-08-18 DIAGNOSIS — D638 Anemia in other chronic diseases classified elsewhere: Secondary | ICD-10-CM | POA: Diagnosis not present

## 2017-08-18 DIAGNOSIS — Z5112 Encounter for antineoplastic immunotherapy: Secondary | ICD-10-CM | POA: Diagnosis not present

## 2017-08-18 DIAGNOSIS — Z7901 Long term (current) use of anticoagulants: Secondary | ICD-10-CM | POA: Diagnosis not present

## 2017-08-18 DIAGNOSIS — C50412 Malignant neoplasm of upper-outer quadrant of left female breast: Secondary | ICD-10-CM | POA: Diagnosis not present

## 2017-08-18 DIAGNOSIS — Z5111 Encounter for antineoplastic chemotherapy: Secondary | ICD-10-CM | POA: Diagnosis not present

## 2017-08-18 DIAGNOSIS — R509 Fever, unspecified: Secondary | ICD-10-CM | POA: Diagnosis not present

## 2017-08-18 DIAGNOSIS — R5383 Other fatigue: Secondary | ICD-10-CM | POA: Diagnosis not present

## 2017-08-18 MED FILL — NARCAN 4 MG NASAL SPRAY: 4 | 30 days supply | Qty: 2 | Fill #0

## 2017-08-18 MED FILL — oxyCODONE HCL 5 MG TABS: 5 | 5 days supply | Qty: 60 | Fill #0

## 2017-08-18 MED FILL — ENOXAPARIN 30 MG/0.3 ML SYR: 30 | 30 days supply | Qty: 9 | Fill #0

## 2017-08-20 MED FILL — LIDOCAINE-PRILOCAINE CREAM: 2.5-2.5 | 10 days supply | Qty: 30 | Fill #1

## 2017-08-23 DIAGNOSIS — T8189XA Other complications of procedures, not elsewhere classified, initial encounter: Secondary | ICD-10-CM | POA: Diagnosis not present

## 2017-08-24 DIAGNOSIS — C50912 Malignant neoplasm of unspecified site of left female breast: Secondary | ICD-10-CM | POA: Diagnosis not present

## 2017-08-24 DIAGNOSIS — C792 Secondary malignant neoplasm of skin: Secondary | ICD-10-CM | POA: Diagnosis not present

## 2017-08-24 DIAGNOSIS — T8189XA Other complications of procedures, not elsewhere classified, initial encounter: Secondary | ICD-10-CM | POA: Diagnosis not present

## 2017-08-24 MED FILL — DARIFENACIN ER 15 MG TABLET: 15 | 30 days supply | Qty: 30 | Fill #8

## 2017-08-25 DIAGNOSIS — C50911 Malignant neoplasm of unspecified site of right female breast: Secondary | ICD-10-CM | POA: Diagnosis not present

## 2017-08-25 DIAGNOSIS — C792 Secondary malignant neoplasm of skin: Secondary | ICD-10-CM | POA: Diagnosis not present

## 2017-08-25 DIAGNOSIS — T8189XD Other complications of procedures, not elsewhere classified, subsequent encounter: Secondary | ICD-10-CM | POA: Diagnosis not present

## 2017-08-26 ENCOUNTER — Telehealth (HOSPITAL_COMMUNITY): Payer: Self-pay

## 2017-08-26 NOTE — Telephone Encounter (Signed)
Patient requesting most recent ehco on file to be faxed to oncologist at # [phone]. Report faxed.  Renee Pain, RN

## 2017-09-01 DIAGNOSIS — Z5111 Encounter for antineoplastic chemotherapy: Secondary | ICD-10-CM | POA: Diagnosis not present

## 2017-09-01 DIAGNOSIS — C50412 Malignant neoplasm of upper-outer quadrant of left female breast: Secondary | ICD-10-CM | POA: Diagnosis not present

## 2017-09-01 DIAGNOSIS — C50912 Malignant neoplasm of unspecified site of left female breast: Secondary | ICD-10-CM | POA: Diagnosis not present

## 2017-09-01 DIAGNOSIS — C792 Secondary malignant neoplasm of skin: Secondary | ICD-10-CM | POA: Diagnosis not present

## 2017-09-01 MED FILL — DULoxetine HCL 30 MG CPEP: 30 | 30 days supply | Qty: 30 | Fill #0

## 2017-09-02 ENCOUNTER — Encounter: Payer: Self-pay | Admitting: Physical Therapy

## 2017-09-02 ENCOUNTER — Ambulatory Visit: Payer: 59 | Attending: Orthopedic Surgery | Admitting: Physical Therapy

## 2017-09-02 DIAGNOSIS — R2689 Other abnormalities of gait and mobility: Secondary | ICD-10-CM | POA: Diagnosis not present

## 2017-09-02 DIAGNOSIS — M25661 Stiffness of right knee, not elsewhere classified: Secondary | ICD-10-CM | POA: Diagnosis not present

## 2017-09-02 DIAGNOSIS — M25561 Pain in right knee: Secondary | ICD-10-CM | POA: Insufficient documentation

## 2017-09-02 DIAGNOSIS — C50911 Malignant neoplasm of unspecified site of right female breast: Secondary | ICD-10-CM | POA: Diagnosis not present

## 2017-09-02 DIAGNOSIS — T8189XD Other complications of procedures, not elsewhere classified, subsequent encounter: Secondary | ICD-10-CM | POA: Diagnosis not present

## 2017-09-02 DIAGNOSIS — M6281 Muscle weakness (generalized): Secondary | ICD-10-CM | POA: Insufficient documentation

## 2017-09-02 DIAGNOSIS — C792 Secondary malignant neoplasm of skin: Secondary | ICD-10-CM | POA: Diagnosis not present

## 2017-09-02 NOTE — Therapy (Signed)
[facility] [address] [place], [place], [address] Phone: [phone]   Fax:  [phone]  Physical Therapy Evaluation  Patient Details  Name: [name] MRN: [id number] Date of Birth: 1956/05/06 Referring Provider: Dr. Paralee Cancel   Encounter Date: 09/02/2017  PT End of Session - 09/02/17 1018    Visit Number  1    Number of Visits  12    Date for PT Re-Evaluation  10/14/17    Authorization Type  [facility] UMR/UHC (prior auth required for secondary insurance); 24 visits total approved (9 used prior to 09/02/17 visit); will need to request additional visits with review from insurance if needed past 24 visits    Authorization - Visit Number  10    Authorization - Number of Visits  24    PT Start Time  [phone]    PT Stop Time  1001    PT Time Calculation (min)  36 min    Activity Tolerance  Patient tolerated treatment well    Behavior During Therapy  [facility] for tasks assessed/performed       Past Medical History:  Diagnosis Date  . 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 Allergic reaction 07/24/2016  . Anemia    needed transfusion spring of 2018  . Arthritis KNEES   right knee, hx. past left knee replacemnt.  . Blood clot in vein    around Portacath right chest-tx. Eliquis about a yr., prior Lovenox.  . Blood dyscrasia   . Chronic anticoagulation HX  PORT-A-CATH CLOT   2010 - HAS BEEN ON LOVENOX SINCE THE CLOT  . Drug rash 07/24/2016  . Elevated blood pressure reading without diagnosis of hypertension    PT MONITORS HER B/P AT HOME  . Heart murmur   . Hematoma of leg, right, sequela   . 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 PONV (postoperative nausea and vomiting)    ponv likes scopolamine patch  . Port-A-Cath in place    RIGHT CHEST   . Skin cancer of anterior chest BREAST CANCER  PRIMARY W/ METS TO CHEST WALL SKIN CANCER--  CHEMOTHERAPY EVERY 3 WEEKS AT [facility]   left 9 yrs ago, chemo every 3 weeks at [place], last chemo july 3rd  . Skin changes related to chemotherapy    per patient she has scabbed over skin circumventing port to r ight upper chest ; denie drainage and state it is due to chemptherapy   . Status post skin flap graft    right chest wall with metastatis right anterior chest -no drainage or open wound.    Past Surgical History:  Procedure Laterality Date  . APPLICATION OF A-CELL OF BACK Right 07/31/2016   Procedure: CELLERATE COLLAGEN PLACEMENT;  Surgeon: Loel Lofty Dillingham, DO;  Location: [place];  Service: Plastics;  Laterality: Right;  . APPLICATION OF WOUND VAC Right 06/29/2017   Procedure: APPLICATION OF WOUND VAC;  Surgeon: Wallace Going, DO;  Location: [facility];  Service: Plastics;  Laterality: Right;  . CHEST WALL TUMOR EXCISION     right  . EXCISIONAL TOTAL KNEE ARTHROPLASTY WITH ANTIBIOTIC SPACERS Right 12/02/2016   Procedure: EXCISIONAL TOTAL KNEE ARTHROPLASTY WITH ANTIBIOTIC SPACERS;  Surgeon: Paralee Cancel, MD;  Location: [facility];  Service: Orthopedics;  Laterality: Right;  90 mins  . HEMATOMA EVACUATION  Right 06/29/2017   Procedure: EVACUATION HEMATOMA OF RIGHT KNEE;  Surgeon: Wallace Going, DO;  Location[facility];  Service: Plastics;  Laterality: Right;  . HEMATOMA EVACUATION Right 06/29/2017   Procedure: EVACUATION RIGHT TOTAL KNEE HEMATOMA;  Surgeon: Paralee Cancel, MD;  [facility];  Service: Orthopedics;  Laterality: Right;  . HEMATOMA EVACUATION Right 07/14/2017   Procedure: EVACUATION RIGHT LEG HEMATOMA WITH APPLICATION OF WOUND VAC;  Surgeon: Paralee Cancel, MD;  [facility];  Service: Orthopedics;  Laterality: Right;  . hemotoma     evacuation left chest wall  . I&D EXTREMITY Right 07/17/2017   Procedure: EVACUATION RIGHT LEG HEMATOMA WITH WOUND VAC DRESSING CHANGE;  Surgeon: Paralee Cancel, MD;  [facility];   Service: Orthopedics;  Laterality: Right;  . I&D KNEE WITH POLY EXCHANGE Right 05/28/2016   Procedure: IRRIGATION AND DEBRIDEMENT KNEE WOUND VAC PLACMENT;  Surgeon: Susa Day, MD;  [facility]: [facility];  Service: Orthopedics;  Laterality: Right;  . I&D KNEE WITH POLY EXCHANGE Right 05/30/2016   Procedure: RADICAL SYNOVECTOMY,IRRIGATION AND DEBRIDEMENT KNEE WITH POLY EXCHANGE WITH ANTIBIOTIC BEADS, APPLICATION OF WOUND VAC;  Surgeon: Susa Day, MD;  [facility];  Service: Orthopedics;  Laterality: Right;  . INCISION AND DRAINAGE OF WOUND Right 07/31/2016   Procedure: IRRIGATION AND DEBRIDEMENT RIGHT KNEE  WOUND;  Surgeon: Loel Lofty Dillingham, DO;  Location: [place];  Service: Plastics;  Laterality: Right;  . IR FLUORO GUIDE CV LINE LEFT  04/30/2017  . IR US GUIDE VASC ACCESS LEFT  04/30/2017  . IRRIGATION AND DEBRIDEMENT KNEE Right 04/12/2016   Procedure: IRRIGATION AND DEBRIDEMENT KNEE;  Surgeon: Nicholes Stairs, MD;  [facility];  Service: Orthopedics;  Laterality: Right;  . IRRIGATION AND DEBRIDEMENT KNEE Right 12/16/2016   Procedure: Repeat irrigation and debridement right knee, wound closure  wound vac REPLACEMENT ANTIBIOTIC SPACERS;  Surgeon: Paralee Cancel, MD;  [facility];  Service: Orthopedics;  Laterality: Right;  . KNEE ARTHROSCOPY  02/13/2012   Procedure: ARTHROSCOPY KNEE;  Surgeon: Johnn Hai, MD;  [facility];  Service: Orthopedics;  Laterality: Left;  WITH DEBRIDEMENt   . KNEE ARTHROSCOPY WITH LATERAL MENISECTOMY  02/13/2012   Procedure: KNEE ARTHROSCOPY WITH LATERAL MENISECTOMY;  Surgeon: Johnn Hai, MD;  Location: [place];  Service: Orthopedics;;  partial  . LEFT MODIFIED RADICAL MASTECTOMY/ RIGHT TOTAL MASTECTOMY  11-13-2007   LEFT BREAST CANCER W/ AXILLARY LYMPH NODE METASTASIS AND POST NEOADJUVANT CHEMO  . MASS EXCISION Right 06/29/2017   Procedure: EXCISION OF METASTATIC BREAST CANCER TO SKIN RIGHT SHOULDER,  PLACEMENT OF CELLERATE;  Surgeon: Wallace Going, DO;  [facility];  Service: Plastics;  Laterality: Right;  . MASTECTOMY     Bilateral  . PLACEMENT PORT-A-CATH  06/22/2007   right chest new port placed 2015, right chest  . REIMPLANTATION OF TOTAL KNEE Right 04/27/2017   Procedure: Reimplantation of right total knee arthroplasty;  Surgeon: Paralee Cancel, MD;  [facility];  Service: Orthopedics;  Laterality: Right;  Adductor Block  . SKIN GRAFT     chest-post tumor removal, second occurrence of cancer" 2015 surgery for Flap 2015"  . SKIN SPLIT GRAFT Right 01/29/2017   Procedure: SKIN GRAFT SPLIT THICKNESS TO RIGHT KNEE WOUND;  Surgeon: Wallace Going, DO;  [facility];  Service: Plastics;  Laterality: Right;  . TOTAL KNEE ARTHROPLASTY Left 09/23/2012   Procedure: LEFT TOTAL KNEE ARTHROPLASTY;  Surgeon: Johnn Hai, MD;  Location:  [facility];  Service: Orthopedics;  Laterality: Left;  . TOTAL KNEE ARTHROPLASTY Right 03/20/2016   Procedure: RIGHT TOTAL KNEE ARTHROPLASTY;  Surgeon: Susa Day, MD;  [facility];  Service: Orthopedics;  Laterality: Right;  . TRANSTHORACIC ECHOCARDIOGRAM  11-18-2011  DR BENSIMHON (ECHO EVERY 3 MONTHS)   HX CHEMO INDUCED CARDIOTOXICITY/  LVSF NORMAL/ EF 55-50%/ MILD MITRIAL VALVE REGURG./ MILDLY INCREASED SYSTOLIC PRESSURE OF PULMONARY ARTERIES  . VASCULAR SURGERY Right    portacath chest    There were no vitals filed for this visit.   Subjective Assessment - 09/02/17 0926    Subjective  Pt is a 62 y/o female who returns to [facility] s/p Rt TKA reconstruction on 69/6/29, which was complicated by poor incision healing and developing hematoma.  Pt then s/p I&D with wound vac x 2 in Feb 2019.  Pt presents today with continued delayed healing, but wound vac removed 08/25/17.      Limitations  Standing;Walking    Patient Stated Goals  improve standing and endurance    Currently in Pain?  Yes    Pain Score  0-No pain up to 6/10    Pain Location   Knee    Pain Orientation  Right    Pain Descriptors / Indicators  Pressure    Pain Type  Surgical pain;Chronic pain    Pain Onset  More than a month ago    Pain Frequency  Intermittent    Aggravating Factors   walking, standing    Pain Relieving Factors  medication, ice, elevation         [facility] PT Assessment - 09/02/17 0930      Assessment   Medical Diagnosis  Rt TKA reconstruction s/p I&D with wound vac placement x 2    Referring Provider  Dr. Paralee Cancel    Onset Date/Surgical Date  04/27/17    Next MD Visit  1 month    Prior Therapy  at this clinic (had to stop due to wound)      Precautions   Precautions  None    Precaution Comments  pt still with open healing wound distal incision of Rt TKA; wrapped for PT so did not assess      Restrictions   Weight Bearing Restrictions  No      Balance Screen   Has the patient fallen in the past 6 months  No    Has the patient had a decrease in activity level because of a fear of falling?   No    Is the patient reluctant to leave their home because of a fear of falling?   No      Home Environment   Living Environment  Private residence    Living Arrangements  Spouse/significant other;Children adult daughter    Type of [place] to enter    Entrance Stairs-Number of Steps  4    Entrance Stairs-Rails  Left    Home Layout  Two level    Alternate Level Stairs-Number of Steps  14    Alternate Level Stairs-Rails  Left    Additional Comments  bed in dining room; goes upstairs every 3 days to shower; otherwise washing up at sink      Prior Function   Level of Independence  Independent    Vocation  On disability;Full time employment    Loss adjuster, chartered at cath lab; currently on disability    Leisure  reading; no regular  exercise      Observation/Other Assessments   Skin Integrity  pt with open wound at distal incision; did not assess as wrapped per MD orders for PT    Focus on Therapeutic Outcomes  (FOTO)   50 (50% limited; predicted 40% limited)      ROM / Strength   AROM / PROM / Strength  AROM;Strength      AROM   AROM Assessment Site  Knee    Right/Left Knee  Right    Right Knee Extension  0    Right Knee Flexion  105      Strength   Overall Strength Comments  all tested in sitting; suspect hip extension weakness due to inablility to rise from chair without UE support    Strength Assessment Site  Hip;Knee;Ankle    Right/Left Hip  Right;Left    Right Hip Flexion  3/5    Right Hip ABduction  3/5    Right Hip ADduction  3/5    Left Hip Flexion  4/5    Left Hip ABduction  3/5    Left Hip ADduction  3/5    Right/Left Knee  Right;Left    Right Knee Flexion  3/5    Right Knee Extension  3+/5    Left Knee Flexion  3+/5    Left Knee Extension  4/5    Right/Left Ankle  Right;Left    Right Ankle Dorsiflexion  4/5    Left Ankle Dorsiflexion  4/5      Transfers   Five time sit to stand comments   15.47 sec with UE support      Ambulation/Gait   Ambulation/Gait  Yes    Ambulation/Gait Assistance  5: Supervision    Gait Pattern  Decreased stance time - right;Decreased step length - left;Trendelenburg;Lateral hip instability    Ambulation Surface  Level;Indoor      6 minute walk test results    Endurance additional comments  3 min walk test: 547                Objective measurements completed on examination: See above findings.      [place] Adult PT Treatment/Exercise - 09/02/17 0930      Self-Care   Self-Care  Other Self-Care Comments    Other Self-Care Comments   educated to resume mat level and seated exercises which pt has at home; and will plan to progress to standing exercises for balance and strengthening             PT Education - 09/02/17 1018    Education provided  Yes    Education Details  see self care    Person(s) Educated  Patient    Methods  Explanation    Comprehension  Verbalized understanding          PT Long Term Goals -  09/02/17 1024      PT LONG TERM GOAL #1   Title  independent with HEP    Status  New    Target Date  10/14/17      PT LONG TERM GOAL #2   Title  demonstrate improved functional strength by performing 5x STS without UE support in < 16 sec    Status  New    Target Date  10/14/17      PT LONG TERM GOAL #3   Title  improve functional endurance by performing 3MWT with at least 700'     Status  New    Target Date  10/14/17      PT LONG TERM GOAL #4   Title  FOTO score improved to </= 40% limitation for improved function    Status  New    Target Date  10/14/17      PT LONG TERM GOAL #5   Title  report pain < 4/10 with activity for improved mobility and function    Status  New    Target Date  10/14/17             Plan - 09/02/17 1018    Clinical Impression Statement  Pt is a 61 y/o female who returns to [place] following reconstruction of Rt TKA on 04/27/18.  Pt's post-op recovery complicated by recurring hematoma requiring I&D x 2 with placement of wound vac.  Pt had wound vac removed last week, but still with open wound at distal incision.  Pt with good ROM but demonstrates deficits in strength, gait and endurance.  Pt will benefit from PT to address deficits listed.    History and Personal Factors relevant to plan of care:  OA, bil TKA with subsequent complications of Rt TKA, Breast cancer with metastasis    Clinical Presentation  Evolving    Clinical Presentation due to:  open wound needing I&D x 2 with wound vac placement    Clinical Decision Making  Moderate    Rehab Potential  Good    PT Frequency  2x / week    PT Duration  6 weeks    PT Treatment/Interventions  ADLs/Self Care Home Management;Cryotherapy;Moist Heat;Therapeutic exercise;Therapeutic activities;Functional mobility training;Stair training;Gait training;Balance training;Neuromuscular re-education;Patient/family education;Manual techniques;Vasopneumatic Device;Taping;Passive range of motion    PT Next Visit Plan   establish standing HEP for balance/strengthening, work on endurance (walking program, use of machines)    Consulted and Agree with Plan of Care  Patient       Patient will benefit from skilled therapeutic intervention in order to improve the following deficits and impairments:  Abnormal gait, Pain, Decreased strength, Decreased activity tolerance, Decreased balance, Decreased mobility, Decreased range of motion  Visit Diagnosis: Acute pain of right knee - Plan: PT plan of care cert/re-cert  Stiffness of right knee, not elsewhere classified - Plan: PT plan of care cert/re-cert  Muscle weakness (generalized) - Plan: PT plan of care cert/re-cert  Other abnormalities of gait and mobility - Plan: PT plan of care cert/re-cert     Problem List Patient Active Problem List   Diagnosis Date Noted  . Wound, open with complication 27/07/5007  . Fever   . Anemia due to chemotherapy 05/07/2017  . Abnormal LFTs 05/07/2017  . History of breast cancer   . Bilateral leg edema   . Enterococcal infection   . S/P revision of total knee, right 04/27/2017  . Pyogenic arthritis of right knee joint ([place])   . Acute blood loss anemia 12/12/2016  . Right knee abx spacer 12/02/2016  . Physical exam 07/31/2016  . Allergic reaction 07/24/2016  . Drug rash 07/24/2016  . S/P total knee arthroplasty, right 05/31/2016  . 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 Nonhealing surgical wound 05/28/2016  . Sepsis ([place]) 05/10/2016  . UTI (urinary tract infection) 05/10/2016  . Cellulitis of right knee 05/10/2016  . Blood clot in vein   . Wound dehiscence, surgical 04/12/2016  . 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 Vitamin D deficiency 01/25/2016  . Metastatic cancer to chest wall ([place]) 02/02/2015  . Primary osteoarthritis of right knee 02/01/2015  . Symptomatic anemia 02/01/2015  .  Frequent UTI 01/12/2014  . Sinus tachycardia 11/03/2013  . SOB (shortness of breath) 11/03/2013  . Postoperative anemia due  to acute blood loss 12/24/2012  .    09/23/2012  . Sinusitis 07/28/2012  . Neuropathy due to chemotherapeutic drug ([place]) 07/25/2011  . 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 Metastatic breast cancer ([place]) 11/12/2010  . Chronic anticoagulation 11/12/2010  . Anxiety, generalized 03/26/2008      Laureen Abrahams, PT, DPT 09/02/17 10:31 AM     [facility] [address], [place], [place], [address] Phone: [phone]   Fax:  [phone]  Name: COTINA FREEDMAN MRN: [id number] Date of Birth: February 13, 1956

## 2017-09-03 ENCOUNTER — Ambulatory Visit: Payer: 59 | Admitting: Physical Therapy

## 2017-09-03 ENCOUNTER — Encounter: Payer: Self-pay | Admitting: Physical Therapy

## 2017-09-03 DIAGNOSIS — M25561 Pain in right knee: Secondary | ICD-10-CM

## 2017-09-03 DIAGNOSIS — M25661 Stiffness of right knee, not elsewhere classified: Secondary | ICD-10-CM

## 2017-09-03 DIAGNOSIS — M6281 Muscle weakness (generalized): Secondary | ICD-10-CM

## 2017-09-03 DIAGNOSIS — R2689 Other abnormalities of gait and mobility: Secondary | ICD-10-CM | POA: Diagnosis not present

## 2017-09-03 MED FILL — oxyCODONE HCL 5 MG TABS: 5 | 7 days supply | Qty: 60 | Fill #0

## 2017-09-03 MED FILL — TYKERB 250 MG TABLET: 250 | 30 days supply | Qty: 150 | Fill #0

## 2017-09-03 NOTE — Therapy (Signed)
[facility] [address] [place], [place], [address] Phone: [phone]   Fax:  [phone]  Physical Therapy Treatment  Patient Details  Name: [name] MRN: [id number] Date of Birth: 1955/11/26 Referring Provider: Dr. Paralee Cancel   Encounter Date: 09/03/2017  PT End of Session - 09/03/17 0914    Visit Number  2    Number of Visits  12    Date for PT Re-Evaluation  10/14/17    Authorization Type  [facility] U[employer] (prior auth required for secondary insurance); 24 visits total approved (9 used prior to 09/02/17 visit); will need to request additional visits with review from insurance if needed past 24 visits    Authorization - Visit Number  11    Authorization - Number of Visits  24    PT Start Time  [id number]    PT Stop Time  0917    PT Time Calculation (min)  39 min    Activity Tolerance  Patient tolerated treatment well    Behavior During Therapy  [facility] for tasks assessed/performed       Past Medical History:  Diagnosis Date  . 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 Allergic reaction 07/24/2016  . Anemia    needed transfusion spring of 2018  . Arthritis KNEES   right knee, hx. past left knee replacemnt.  . Blood clot in vein    around Portacath right chest-tx. Eliquis about a yr., prior Lovenox.  . Blood dyscrasia   . Chronic anticoagulation HX  PORT-A-CATH CLOT   2010 - HAS BEEN ON LOVENOX SINCE THE CLOT  . Drug rash 07/24/2016  . Elevated blood pressure reading without diagnosis of hypertension    PT MONITORS HER B/P AT HOME  . Heart murmur   . Hematoma of leg, right, sequela   . 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 PONV (postoperative nausea and vomiting)    ponv likes scopolamine patch  . Port-A-Cath in place    RIGHT CHEST   . Skin cancer of anterior chest BREAST CANCER PRIMARY  W/ METS TO CHEST WALL SKIN CANCER--  CHEMOTHERAPY EVERY 3 WEEKS AT [facility]   left 9 yrs ago, chemo every 3 weeks at [place], last chemo july 3rd  . Skin changes related to chemotherapy    per patient she has scabbed over skin circumventing port to r ight upper chest ; denie drainage and state it is due to chemptherapy   . Status post skin flap graft    right chest wall with metastatis right anterior chest -no drainage or open wound.    Past Surgical History:  Procedure Laterality Date  . APPLICATION OF A-CELL OF BACK Right 07/31/2016   Procedure: CELLERATE COLLAGEN PLACEMENT;  Surgeon: Loel Lofty Dillingham, DO;  Location: [facility];  Service: Plastics;  Laterality: Right;  . APPLICATION OF WOUND VAC Right 06/29/2017   Procedure: APPLICATION OF WOUND VAC;  Surgeon: Wallace Going, DO;  Location: [facility];  Service: Plastics;  Laterality: Right;  . CHEST WALL TUMOR EXCISION     right  . EXCISIONAL TOTAL KNEE ARTHROPLASTY WITH ANTIBIOTIC SPACERS Right 12/02/2016   Procedure: EXCISIONAL TOTAL KNEE ARTHROPLASTY WITH ANTIBIOTIC SPACERS;  Surgeon: Paralee Cancel, MD;  Location: [facility];  Service: Orthopedics;  Laterality: Right;  90 mins  . HEMATOMA EVACUATION  Right 06/29/2017   Procedure: EVACUATION HEMATOMA OF RIGHT KNEE;  Surgeon: Wallace Going, DO;  Location: [facility];  Service: Plastics;  Laterality: Right;  . HEMATOMA EVACUATION Right 06/29/2017   Procedure: EVACUATION RIGHT TOTAL KNEE HEMATOMA;  Surgeon: Paralee Cancel, MD;  Location[facility];  Service: Orthopedics;  Laterality: Right;  . HEMATOMA EVACUATION Right 07/14/2017   Procedure: EVACUATION RIGHT LEG HEMATOMA WITH APPLICATION OF WOUND VAC;  Surgeon: Paralee Cancel, MD;  Location: [facility];  Service: Orthopedics;  Laterality: Right;  . hemotoma     evacuation left chest wall  . I&D EXTREMITY Right 07/17/2017   Procedure: EVACUATION RIGHT LEG HEMATOMA WITH WOUND VAC DRESSING CHANGE;  Surgeon: Paralee Cancel, MD;  [facility];  Service:  Orthopedics;  Laterality: Right;  . I&D KNEE WITH POLY EXCHANGE Right 05/28/2016   Procedure: IRRIGATION AND DEBRIDEMENT KNEE WOUND VAC PLACMENT;  Surgeon: Susa Day, MD;  Location: [facility];  Service: Orthopedics;  Laterality: Right;  . I&D KNEE WITH POLY EXCHANGE Right 05/30/2016   Procedure: RADICAL SYNOVECTOMY,IRRIGATION AND DEBRIDEMENT KNEE WITH POLY EXCHANGE WITH ANTIBIOTIC BEADS, APPLICATION OF WOUND VAC;  Surgeon: Susa Day, MD;  Location[facility];  Service: Orthopedics;  Laterality: Right;  . INCISION AND DRAINAGE OF WOUND Right 07/31/2016   Procedure: IRRIGATION AND DEBRIDEMENT RIGHT KNEE  WOUND;  Surgeon: Loel Lofty Dillingham, DO;  Location: [facility];  Service: Plastics;  Laterality: Right;  . IR FLUORO GUIDE CV LINE LEFT  04/30/2017  . IR US GUIDE VASC ACCESS LEFT  04/30/2017  . IRRIGATION AND DEBRIDEMENT KNEE Right 04/12/2016   Procedure: IRRIGATION AND DEBRIDEMENT KNEE;  Surgeon: Nicholes Stairs, MD;  [facility];  Service: Orthopedics;  Laterality: Right;  . IRRIGATION AND DEBRIDEMENT KNEE Right 12/16/2016   Procedure: Repeat irrigation and debridement right knee, wound closure  wound vac REPLACEMENT ANTIBIOTIC SPACERS;  Surgeon: Paralee Cancel, MD;  Location[facility];  Service: Orthopedics;  Laterality: Right;  . KNEE ARTHROSCOPY  02/13/2012   Procedure: ARTHROSCOPY KNEE;  Surgeon: Johnn Hai, MD;  Location: [facility];  Service: Orthopedics;  Laterality: Left;  WITH DEBRIDEMENt   . KNEE ARTHROSCOPY WITH LATERAL MENISECTOMY  02/13/2012   Procedure: KNEE ARTHROSCOPY WITH LATERAL MENISECTOMY;  Surgeon: Johnn Hai, MD;  Location: [facility] [facility];  Service: Orthopedics;;  partial  . LEFT MODIFIED RADICAL MASTECTOMY/ RIGHT TOTAL MASTECTOMY  11-13-2007   LEFT BREAST CANCER W/ AXILLARY LYMPH NODE METASTASIS AND POST NEOADJUVANT CHEMO  . MASS EXCISION Right 06/29/2017   Procedure: EXCISION OF METASTATIC BREAST CANCER TO SKIN RIGHT SHOULDER, PLACEMENT OF  CELLERATE;  Surgeon: Wallace Going, DO;  Location[facility];  Service: Plastics;  Laterality: Right;  . MASTECTOMY     Bilateral  . PLACEMENT PORT-A-CATH  06/22/2007   right chest new port placed 2015, right chest  . REIMPLANTATION OF TOTAL KNEE Right 04/27/2017   Procedure: Reimplantation of right total knee arthroplasty;  Surgeon: Paralee Cancel, MD;  Location[facility];  Service: Orthopedics;  Laterality: Right;  Adductor Block  . SKIN GRAFT     chest-post tumor removal, second occurrence of cancer" 2015 surgery for Flap 2015"  . SKIN SPLIT GRAFT Right 01/29/2017   Procedure: SKIN GRAFT SPLIT THICKNESS TO RIGHT KNEE WOUND;  Surgeon: Wallace Going, DO;  Location[facility];  Service: Plastics;  Laterality: Right;  . TOTAL KNEE ARTHROPLASTY Left 09/23/2012   Procedure: LEFT TOTAL KNEE ARTHROPLASTY;  Surgeon: Johnn Hai, MD;  Location:  [facility];  Service: Orthopedics;  Laterality: Left;  . TOTAL KNEE ARTHROPLASTY Right 03/20/2016   Procedure: RIGHT TOTAL KNEE ARTHROPLASTY;  Surgeon: Susa Day, MD;  [facility];  Service: Orthopedics;  Laterality: Right;  . TRANSTHORACIC ECHOCARDIOGRAM  11-18-2011  DR BENSIMHON (ECHO EVERY 3 MONTHS)   HX CHEMO INDUCED CARDIOTOXICITY/  LVSF NORMAL/ EF 55-50%/ MILD MITRIAL VALVE REGURG./ MILDLY INCREASED SYSTOLIC PRESSURE OF PULMONARY ARTERIES  . VASCULAR SURGERY Right    portacath chest    There were no vitals filed for this visit.  Subjective Assessment - 09/03/17 0837    Subjective  pretty sore after yesterday's eval, but overall doing well.    Patient Stated Goals  improve standing and endurance    Pain Score  0-No pain         [facility] PT Assessment - 09/02/17 0930      Assessment   Medical Diagnosis  Rt TKA reconstruction s/p I&D with wound vac placement x 2    Referring Provider  Dr. Paralee Cancel    Onset Date/Surgical Date  04/27/17    Next MD Visit  1 month    Prior Therapy  at this clinic (had to stop due to wound)       Precautions   Precautions  None    Precaution Comments  pt still with open healing wound distal incision of Rt TKA; wrapped for PT so did not assess      Restrictions   Weight Bearing Restrictions  No      Balance Screen   Has the patient fallen in the past 6 months  No    Has the patient had a decrease in activity level because of a fear of falling?   No    Is the patient reluctant to leave their home because of a fear of falling?   No      Home Environment   Living Environment  Private residence    Living Arrangements  Spouse/significant other;Children adult daughter    Type of [place] to enter    Entrance Stairs-Number of Steps  4    Entrance Stairs-Rails  Left    Home Layout  Two level    Alternate Level Stairs-Number of Steps  14    Alternate Level Stairs-Rails  Left    Additional Comments  bed in dining room; goes upstairs every 3 days to shower; otherwise washing up at sink      Prior Function   Level of Independence  Independent    Vocation  On disability;Full time employment    Loss adjuster, chartered at cath lab; currently on disability    Leisure  reading; no regular exercise      Observation/Other Assessments   Skin Integrity  pt with open wound at distal incision; did not assess as wrapped per MD orders for PT    Focus on Therapeutic Outcomes (FOTO)   50 (50% limited; predicted 40% limited)      ROM / Strength   AROM / PROM / Strength  AROM;Strength      AROM   AROM Assessment Site  Knee    Right/Left Knee  Right    Right Knee Extension  0    Right Knee Flexion  105      Strength   Overall Strength Comments  all tested in sitting; suspect hip extension weakness due to inablility to rise from chair without UE support  Strength Assessment Site  Hip;Knee;Ankle    Right/Left Hip  Right;Left    Right Hip Flexion  3/5    Right Hip ABduction  3/5    Right Hip ADduction  3/5    Left Hip Flexion  4/5    Left Hip ABduction  3/5     Left Hip ADduction  3/5    Right/Left Knee  Right;Left    Right Knee Flexion  3/5    Right Knee Extension  3+/5    Left Knee Flexion  3+/5    Left Knee Extension  4/5    Right/Left Ankle  Right;Left    Right Ankle Dorsiflexion  4/5    Left Ankle Dorsiflexion  4/5      Transfers   Five time sit to stand comments   15.47 sec with UE support      Ambulation/Gait   Ambulation/Gait  Yes    Ambulation/Gait Assistance  5: Supervision    Gait Pattern  Decreased stance time - right;Decreased step length - left;Trendelenburg;Lateral hip instability    Ambulation Surface  Level;Indoor      6 minute walk test results    Endurance additional comments  3 min walk test: 547                   [facility] Adult PT Treatment/Exercise - 09/03/17 0840      Exercises   Exercises  Knee/Hip      Knee/Hip Exercises: Aerobic   Nustep  L3 x 8 min      Knee/Hip Exercises: Standing   Heel Raises  20 reps;Both    Heel Raises Limitations  toe raises x 20 reps    Knee Flexion  Both;20 reps    Hip Flexion  Both;20 reps;Knee bent    Hip Abduction  Both;20 reps;Knee straight    Hip Extension  Both;20 reps;Knee straight      Knee/Hip Exercises: Seated   Long Arc Quad  Both;20 reps;Weights    Long Arc Quad Weight  2 lbs.    Ball Squeeze  20 reps    Marching  Both;20 reps;Weights    Marching Weights  2 lbs.             PT Education - 09/03/17 0913    Education provided  Yes    Education Details  HEP    Person(s) Educated  Patient    Methods  Explanation;Demonstration;Handout    Comprehension  Verbalized understanding;Returned demonstration;Need further instruction          PT Long Term Goals - 09/02/17 1024      PT LONG TERM GOAL #1   Title  independent with HEP    Status  New    Target Date  10/14/17      PT LONG TERM GOAL #2   Title  demonstrate improved functional strength by performing 5x STS without UE support in < 16 sec    Status  New    Target Date  10/14/17       PT LONG TERM GOAL #3   Title  improve functional endurance by performing 3MWT with at least 700'     Status  New    Target Date  10/14/17      PT LONG TERM GOAL #4   Title  FOTO score improved to </= 40% limitation for improved function    Status  New    Target Date  10/14/17      PT LONG TERM GOAL #5  Title  report pain < 4/10 with activity for improved mobility and function    Status  New    Target Date  10/14/17            Plan - 09/03/17 1540    Clinical Impression Statement  Initiated standing HEP today to help with balance and strengthening.  Pt needing min cues for proper form, and rest breaks needed due to fatigue.  Progressing well with PT.    PT Treatment/Interventions  ADLs/Self Care Home Management;Cryotherapy;Moist Heat;Therapeutic exercise;Therapeutic activities;Functional mobility training;Stair training;Gait training;Balance training;Neuromuscular re-education;Patient/family education;Manual techniques;Vasopneumatic Device;Taping;Passive range of motion    PT Next Visit Plan  review standing HEP for balance/strengthening, work on endurance (walking program, use of machines)    PT Home Exercise Plan  Access Code: [email or web address]     Consulted and Agree with Plan of Care  Patient       Patient will benefit from skilled therapeutic intervention in order to improve the following deficits and impairments:  Abnormal gait, Pain, Decreased strength, Decreased activity tolerance, Decreased balance, Decreased mobility, Decreased range of motion  Visit Diagnosis: Acute pain of right knee  Stiffness of right knee, not elsewhere classified  Muscle weakness (generalized)  Other abnormalities of gait and mobility     Problem List Patient Active Problem List   Diagnosis Date Noted  . Wound, open with complication 08/67/6195  . Fever   . Anemia due to chemotherapy 05/07/2017  . Abnormal LFTs 05/07/2017  . History of breast cancer   . Bilateral leg edema   .  Enterococcal infection   . S/P revision of total knee, right 04/27/2017  . Pyogenic arthritis of right knee joint ([place])   . Acute blood loss anemia 12/12/2016  . Right knee abx spacer 12/02/2016  . Physical exam 07/31/2016  . Allergic reaction 07/24/2016  . Drug rash 07/24/2016  . S/P total knee arthroplasty, right 05/31/2016  . 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 Nonhealing surgical wound 05/28/2016  . Sepsis ([place]) 05/10/2016  . UTI (urinary tract infection) 05/10/2016  . Cellulitis of right knee 05/10/2016  . Blood clot in vein   . Wound dehiscence, surgical 04/12/2016  . 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 Vitamin D deficiency 01/25/2016  . Metastatic cancer to chest wall ([place]) 02/02/2015  . Primary osteoarthritis of right knee 02/01/2015  . Symptomatic anemia 02/01/2015  . Frequent UTI 01/12/2014  . Sinus tachycardia 11/03/2013  . SOB (shortness of breath) 11/03/2013  . Postoperative anemia due to acute blood loss 12/24/2012  .    09/23/2012  . Sinusitis 07/28/2012  . Neuropathy due to chemotherapeutic drug ([place]) 07/25/2011  . 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 Metastatic breast cancer ([place]) 11/12/2010  . Chronic anticoagulation 11/12/2010  . Anxiety, generalized 03/26/2008      Laureen Abrahams, PT, DPT 09/03/17 9:18 AM    [facility] [address], [place], [place], [address] Phone: [phone]   Fax:  [phone]  Name: ARRIANA LOHMANN MRN: [id number] Date of Birth: 1956/02/20

## 2017-09-03 NOTE — Patient Instructions (Signed)
Access Code: [email or web address]  URL: [email or web address]  Date: 09/03/2017  Prepared by: Faustino Congress   Exercises  Heel Raises with Counter Support - 20 reps - 1 sets - 1x daily - 7x weekly  Standing Toe Raises at Chair - 20 reps - 1 sets - 1x daily - 7x weekly  Standing Hip Abduction with Counter Support - 20 reps - 1 sets - 1x daily - 7x weekly  Standing Hip Extension with Counter Support - 20 reps - 1 sets - 1x daily - 7x weekly  Standing March with Counter Support - 20 reps - 1 sets - 1x daily - 7x weekly  Standing Knee Flexion with Counter Support - 20 reps - 1 sets - 1x daily - 7x weekly

## 2017-09-04 ENCOUNTER — Other Ambulatory Visit: Payer: Self-pay

## 2017-09-04 ENCOUNTER — Encounter (HOSPITAL_BASED_OUTPATIENT_CLINIC_OR_DEPARTMENT_OTHER): Payer: Self-pay | Admitting: *Deleted

## 2017-09-04 ENCOUNTER — Ambulatory Visit: Payer: Self-pay | Admitting: Physician Assistant

## 2017-09-04 MED FILL — CAPECITABINE 500 MG TABLET: 500 | 21 days supply | Qty: 112 | Fill #0

## 2017-09-04 NOTE — Progress Notes (Signed)
SPOKE W/ PT VIA PHONE FOR PRE-OP INTERVIEW.  NPO AFTER MN.  ARRIVE AT 0700.  NEEDS ISTAT 8 (PT GETS CHEMO EVERY 3 WEEKS).  LAST LAB WORK DATED 08-21-2017 IN CARE EVERYWHERE DONE AT [facility] (CBC,CMET).  CURRENT EKG IN CHART AND Epic.  CURRENT CT CHEST/ABD. DATED 08-18-2017 IN CARE EVERYWHERE IN Epic, AND LAST CXR W/ CHART AND Epic (DATED 05-10-2016).  PT WILL TAKE GABAPENTIN, ZYRTEC, COLACE, AND ENABLEX AM DOS W/ SIPS OF WATER AND IF NEEDED TAKE TYLENOL/ OXYCODONE.

## 2017-09-05 ENCOUNTER — Ambulatory Visit: Payer: Self-pay | Admitting: Plastic Surgery

## 2017-09-07 ENCOUNTER — Encounter (HOSPITAL_BASED_OUTPATIENT_CLINIC_OR_DEPARTMENT_OTHER)
Admission: RE | Disposition: A | Discharge status: Home / Self Care | Payer: Self-pay | Source: Ambulatory Visit | Attending: Plastic Surgery

## 2017-09-07 ENCOUNTER — Ambulatory Visit (HOSPITAL_BASED_OUTPATIENT_CLINIC_OR_DEPARTMENT_OTHER): Payer: 59 | Admitting: Anesthesiology

## 2017-09-07 ENCOUNTER — Ambulatory Visit (HOSPITAL_BASED_OUTPATIENT_CLINIC_OR_DEPARTMENT_OTHER)
Admission: RE | Admit: 2017-09-07 | Discharge: 2017-09-07 | Disposition: A | Discharge status: Home / Self Care | Payer: 59 | Source: Ambulatory Visit | Attending: Plastic Surgery | Admitting: Plastic Surgery

## 2017-09-07 ENCOUNTER — Ambulatory Visit: Payer: 59 | Admitting: Physical Therapy

## 2017-09-07 ENCOUNTER — Encounter (HOSPITAL_BASED_OUTPATIENT_CLINIC_OR_DEPARTMENT_OTHER): Payer: Self-pay | Admitting: Plastic Surgery

## 2017-09-07 ENCOUNTER — Other Ambulatory Visit: Payer: Self-pay

## 2017-09-07 DIAGNOSIS — Z9221 Personal history of antineoplastic chemotherapy: Secondary | ICD-10-CM | POA: Diagnosis not present

## 2017-09-07 DIAGNOSIS — S81001A Unspecified open wound, right knee, initial encounter: Secondary | ICD-10-CM | POA: Diagnosis not present

## 2017-09-07 DIAGNOSIS — Y838 Other surgical procedures as the cause of abnormal reaction of the patient, or of later complication, without mention of misadventure at the time of the procedure: Secondary | ICD-10-CM | POA: Diagnosis not present

## 2017-09-07 DIAGNOSIS — Z7901 Long term (current) use of anticoagulants: Secondary | ICD-10-CM | POA: Diagnosis not present

## 2017-09-07 DIAGNOSIS — Z79899 Other long term (current) drug therapy: Secondary | ICD-10-CM | POA: Insufficient documentation

## 2017-09-07 DIAGNOSIS — Z85828 Personal history of other malignant neoplasm of skin: Secondary | ICD-10-CM | POA: Diagnosis not present

## 2017-09-07 DIAGNOSIS — A419 Sepsis, unspecified organism: Secondary | ICD-10-CM | POA: Diagnosis not present

## 2017-09-07 DIAGNOSIS — Z9013 Acquired absence of bilateral breasts and nipples: Secondary | ICD-10-CM | POA: Diagnosis not present

## 2017-09-07 DIAGNOSIS — T8189XA Other complications of procedures, not elsewhere classified, initial encounter: Secondary | ICD-10-CM | POA: Insufficient documentation

## 2017-09-07 DIAGNOSIS — E559 Vitamin D deficiency, unspecified: Secondary | ICD-10-CM | POA: Diagnosis not present

## 2017-09-07 DIAGNOSIS — T8189XD Other complications of procedures, not elsewhere classified, subsequent encounter: Secondary | ICD-10-CM | POA: Diagnosis not present

## 2017-09-07 DIAGNOSIS — N390 Urinary tract infection, site not specified: Secondary | ICD-10-CM | POA: Diagnosis not present

## 2017-09-07 DIAGNOSIS — Z853 Personal history of malignant neoplasm of breast: Secondary | ICD-10-CM | POA: Insufficient documentation

## 2017-09-07 DIAGNOSIS — Z96651 Presence of right artificial knee joint: Secondary | ICD-10-CM | POA: Diagnosis not present

## 2017-09-07 DIAGNOSIS — G620 Drug-induced polyneuropathy: Secondary | ICD-10-CM | POA: Diagnosis not present

## 2017-09-07 DIAGNOSIS — T451X5A Adverse effect of antineoplastic and immunosuppressive drugs, initial encounter: Secondary | ICD-10-CM | POA: Diagnosis not present

## 2017-09-07 HISTORY — DX: Adverse effect of antineoplastic and immunosuppressive drugs, initial encounter: G62.0

## 2017-09-07 HISTORY — DX: Embolism and thrombosis of unspecified artery: I74.9

## 2017-09-07 HISTORY — DX: Adverse effect of antineoplastic and immunosuppressive drugs, initial encounter: T45.1X5A

## 2017-09-07 HISTORY — PX: SKIN SPLIT GRAFT: SHX444

## 2017-09-07 HISTORY — DX: Other specified abnormal immunological findings in serum: R76.8

## 2017-09-07 HISTORY — DX: Malignant neoplasm of unspecified site of right female breast: C50.911

## 2017-09-07 HISTORY — DX: Secondary malignant neoplasm of skin: C79.2

## 2017-09-07 HISTORY — DX: Other complications of procedures, not elsewhere classified, initial encounter: T81.89XA

## 2017-09-07 HISTORY — DX: Nonrheumatic mitral (valve) prolapse: I34.1

## 2017-09-07 LAB — POCT I-STAT, CHEM 8
BUN: 13 mg/dL (ref 6–20)
CALCIUM ION: 1.19 mmol/L (ref 1.15–1.40)
CHLORIDE: 97 mmol/L — AB (ref 101–111)
Creatinine, Ser: 0.7 mg/dL (ref 0.44–1.00)
Glucose, Bld: 88 mg/dL (ref 65–99)
HCT: 40 % (ref 36.0–46.0)
HEMOGLOBIN: 13.6 g/dL (ref 12.0–15.0)
Potassium: 3.8 mmol/L (ref 3.5–5.1)
SODIUM: 139 mmol/L (ref 135–145)
TCO2: 30 mmol/L (ref 22–32)

## 2017-09-07 SURGERY — APPLICATION, GRAFT, SKIN, SPLIT-THICKNESS
Anesthesia: General | Site: Knee | Laterality: Right

## 2017-09-07 MED ORDER — ONDANSETRON HCL 4 MG/2ML IJ SOLN
INTRAMUSCULAR | Status: DC | PRN
  Administered 2017-09-07: 4 mg via INTRAVENOUS

## 2017-09-07 MED ORDER — SODIUM CHLORIDE 0.9% FLUSH
3.0000 mL | Freq: Two times a day (BID) | INTRAVENOUS | Status: DC
  Filled 2017-09-07: qty 3

## 2017-09-07 MED ORDER — LACTATED RINGERS IV SOLN
INTRAVENOUS | Status: DC
  Administered 2017-09-07: 08:00:00 via INTRAVENOUS
  Filled 2017-09-07: qty 1000

## 2017-09-07 MED ORDER — FENTANYL CITRATE (PF) 100 MCG/2ML IJ SOLN
25.0000 ug | INTRAMUSCULAR | Status: DC | PRN
  Filled 2017-09-07: qty 1

## 2017-09-07 MED ORDER — SCOPOLAMINE 1 MG/3DAYS TD PT72
MEDICATED_PATCH | TRANSDERMAL | Status: AC
  Filled 2017-09-07: qty 1

## 2017-09-07 MED ORDER — PROPOFOL 10 MG/ML IV BOLUS
INTRAVENOUS | Status: AC
  Filled 2017-09-07: qty 20

## 2017-09-07 MED ORDER — LIDOCAINE 2% (20 MG/ML) 5 ML SYRINGE
INTRAMUSCULAR | Status: AC
  Filled 2017-09-07: qty 5

## 2017-09-07 MED ORDER — BACITRACIN ZINC 500 UNIT/GM EX OINT
TOPICAL_OINTMENT | CUTANEOUS | Status: AC
  Filled 2017-09-07: qty 28.35

## 2017-09-07 MED ORDER — MIDAZOLAM HCL 2 MG/2ML IJ SOLN
INTRAMUSCULAR | Status: AC
Start: 2017-09-07 — End: 2017-09-07
  Filled 2017-09-07: qty 2

## 2017-09-07 MED ORDER — SODIUM CHLORIDE 0.9% FLUSH
3.0000 mL | INTRAVENOUS | Status: DC | PRN
  Filled 2017-09-07: qty 3

## 2017-09-07 MED ORDER — BUPIVACAINE-EPINEPHRINE (PF) 0.25% -1:200000 IJ SOLN
INTRAMUSCULAR | Status: AC
  Filled 2017-09-07: qty 30

## 2017-09-07 MED ORDER — SODIUM CHLORIDE 0.9 % IJ SOLN
INTRAMUSCULAR | Status: AC
  Filled 2017-09-07: qty 50

## 2017-09-07 MED ORDER — FENTANYL CITRATE (PF) 100 MCG/2ML IJ SOLN
INTRAMUSCULAR | Status: DC | PRN
  Administered 2017-09-07 (×2): 50 ug via INTRAVENOUS

## 2017-09-07 MED ORDER — ACETAMINOPHEN 650 MG RE SUPP
650.0000 mg | RECTAL | Status: DC | PRN
  Filled 2017-09-07: qty 1

## 2017-09-07 MED ORDER — CIPROFLOXACIN IN D5W 400 MG/200ML IV SOLN
INTRAVENOUS | Status: AC
  Filled 2017-09-07: qty 200

## 2017-09-07 MED ORDER — DEXAMETHASONE SODIUM PHOSPHATE 4 MG/ML IJ SOLN
INTRAMUSCULAR | Status: DC | PRN
  Administered 2017-09-07: 4 mg via INTRAVENOUS

## 2017-09-07 MED ORDER — ACETAMINOPHEN 500 MG PO TABS
ORAL_TABLET | ORAL | Status: AC
  Filled 2017-09-07: qty 2

## 2017-09-07 MED ORDER — CIPROFLOXACIN IN D5W 400 MG/200ML IV SOLN
400.0000 mg | INTRAVENOUS | Status: AC
  Administered 2017-09-07: 400 mg via INTRAVENOUS
  Filled 2017-09-07: qty 200

## 2017-09-07 MED ORDER — PROPOFOL 10 MG/ML IV BOLUS
INTRAVENOUS | Status: DC | PRN
  Administered 2017-09-07 (×2): 200 mg via INTRAVENOUS

## 2017-09-07 MED ORDER — ONDANSETRON HCL 4 MG/2ML IJ SOLN
INTRAMUSCULAR | Status: AC
  Filled 2017-09-07: qty 2

## 2017-09-07 MED ORDER — LIDOCAINE 2% (20 MG/ML) 5 ML SYRINGE
INTRAMUSCULAR | Status: DC | PRN
  Administered 2017-09-07: 60 mg via INTRAVENOUS

## 2017-09-07 MED ORDER — SODIUM CHLORIDE 0.9 % IV SOLN
INTRAVENOUS | Status: DC | PRN
  Administered 2017-09-07: 500 mL

## 2017-09-07 MED ORDER — PROMETHAZINE HCL 25 MG/ML IJ SOLN
6.2500 mg | INTRAMUSCULAR | Status: DC | PRN
Start: 2017-09-07 — End: 2017-09-07
  Filled 2017-09-07: qty 1

## 2017-09-07 MED ORDER — LIDOCAINE-EPINEPHRINE 1 %-1:100000 IJ SOLN
INTRAMUSCULAR | Status: AC
  Filled 2017-09-07: qty 1

## 2017-09-07 MED ORDER — ACETAMINOPHEN 500 MG PO TABS
1000.0000 mg | ORAL_TABLET | ORAL | Status: AC
  Administered 2017-09-07: 1000 mg via ORAL
  Filled 2017-09-07: qty 2

## 2017-09-07 MED ORDER — FENTANYL CITRATE (PF) 100 MCG/2ML IJ SOLN
INTRAMUSCULAR | Status: AC
  Filled 2017-09-07: qty 2

## 2017-09-07 MED ORDER — ACETAMINOPHEN 325 MG PO TABS
325.0000 mg | ORAL_TABLET | ORAL | Status: DC | PRN
  Filled 2017-09-07: qty 1

## 2017-09-07 MED ORDER — SODIUM CHLORIDE 0.9 % IV SOLN
250.0000 mL | INTRAVENOUS | Status: DC | PRN
  Filled 2017-09-07: qty 250

## 2017-09-07 MED ORDER — DEXAMETHASONE SODIUM PHOSPHATE 10 MG/ML IJ SOLN
INTRAMUSCULAR | Status: AC
  Filled 2017-09-07: qty 1

## 2017-09-07 MED ORDER — MINERAL OIL LIGHT 100 % EX OIL
TOPICAL_OIL | CUTANEOUS | Status: AC
  Filled 2017-09-07: qty 25

## 2017-09-07 MED ORDER — BUPIVACAINE-EPINEPHRINE 0.25% -1:200000 IJ SOLN
INTRAMUSCULAR | Status: DC | PRN
  Administered 2017-09-07: 20 mL

## 2017-09-07 MED ORDER — MIDAZOLAM HCL 5 MG/5ML IJ SOLN
INTRAMUSCULAR | Status: DC | PRN
  Administered 2017-09-07: 1 mg via INTRAVENOUS

## 2017-09-07 SURGICAL SUPPLY — 84 items
ADH SKN CLS APL DERMABOND .7 (GAUZE/BANDAGES/DRESSINGS)
APL SKNCLS STERI-STRIP NONHPOA (GAUZE/BANDAGES/DRESSINGS)
BAG DECANTER FOR FLEXI CONT (MISCELLANEOUS) ×1 IMPLANT
BALL CTTN LRG ABS STRL LF (GAUZE/BANDAGES/DRESSINGS) ×2
BANDAGE ACE 4X5 VEL STRL LF (GAUZE/BANDAGES/DRESSINGS) IMPLANT
BANDAGE ACE 6X5 VEL STRL LF (GAUZE/BANDAGES/DRESSINGS) IMPLANT
BANDAGE ELASTIC 3 VELCRO ST LF (GAUZE/BANDAGES/DRESSINGS) IMPLANT
BANDAGE ELASTIC 4 VELCRO ST LF (GAUZE/BANDAGES/DRESSINGS) ×1 IMPLANT
BANDAGE ELASTIC 6 VELCRO ST LF (GAUZE/BANDAGES/DRESSINGS) ×1 IMPLANT
BENZOIN TINCTURE PRP APPL 2/3 (GAUZE/BANDAGES/DRESSINGS) IMPLANT
BLADE CLIPPER SURG (BLADE) IMPLANT
BLADE DERMATOME SS (BLADE) ×1 IMPLANT
BLADE HEX COATED 2.75 (ELECTRODE) IMPLANT
BLADE SURG 10 STRL SS (BLADE) IMPLANT
BLADE SURG 15 STRL LF DISP TIS (BLADE) ×1 IMPLANT
BLADE SURG 15 STRL SS (BLADE) ×2
BNDG GAUZE ELAST 4 BULKY (GAUZE/BANDAGES/DRESSINGS) ×2 IMPLANT
CLOTH BEACON ORANGE TIMEOUT ST (SAFETY) ×2 IMPLANT
COTTONBALL LRG STERILE PKG (GAUZE/BANDAGES/DRESSINGS) ×4 IMPLANT
COVER BACK TABLE 60X90IN (DRAPES) ×2 IMPLANT
COVER MAYO STAND STRL (DRAPES) ×2 IMPLANT
DECANTER SPIKE VIAL GLASS SM (MISCELLANEOUS) IMPLANT
DERMABOND ADVANCED (GAUZE/BANDAGES/DRESSINGS)
DERMABOND ADVANCED .7 DNX12 (GAUZE/BANDAGES/DRESSINGS) IMPLANT
DERMACARRIERS GRAFT 1 TO 1.5 (DISPOSABLE)
DRAPE EXTREMITY T 121X128X90 (DRAPE) IMPLANT
DRAPE INCISE IOBAN 66X45 STRL (DRAPES) IMPLANT
DRAPE LAPAROTOMY 100X72 PEDS (DRAPES) IMPLANT
DRAPE ORTHO SPLIT 77X108 STRL (DRAPES)
DRAPE SURG 17X23 STRL (DRAPES) ×8 IMPLANT
DRAPE SURG ORHT 6 SPLT 77X108 (DRAPES) IMPLANT
DRSG ADAPTIC 3X8 NADH LF (GAUZE/BANDAGES/DRESSINGS) IMPLANT
DRSG EMULSION OIL 3X3 NADH (GAUZE/BANDAGES/DRESSINGS) ×2 IMPLANT
DRSG OPSITE 6X11 MED (GAUZE/BANDAGES/DRESSINGS) IMPLANT
ELECT REM PT RETURN 9FT ADLT (ELECTROSURGICAL) ×2
ELECTRODE REM PT RTRN 9FT ADLT (ELECTROSURGICAL) IMPLANT
GAUZE SPONGE 4X4 12PLY STRL (GAUZE/BANDAGES/DRESSINGS) ×2 IMPLANT
GAUZE SPONGE 4X4 16PLY XRAY LF (GAUZE/BANDAGES/DRESSINGS) IMPLANT
GAUZE XEROFORM 1X8 LF (GAUZE/BANDAGES/DRESSINGS) ×2 IMPLANT
GLOVE BIO SURGEON STRL SZ 6.5 (GLOVE) ×6 IMPLANT
GOWN STRL REUS W/ TWL XL LVL3 (GOWN DISPOSABLE) ×1 IMPLANT
GOWN STRL REUS W/TWL XL LVL3 (GOWN DISPOSABLE) ×2
GRAFT DERMACARRIERS 1 TO 1.5 (DISPOSABLE) IMPLANT
IMMOBILIZER KNEE 22 UNIV (SOFTGOODS) ×1 IMPLANT
KIT TURNOVER CYSTO (KITS) ×2 IMPLANT
MANIFOLD NEPTUNE II (INSTRUMENTS) IMPLANT
NEEDLE 27GAX1X1/2 (NEEDLE) IMPLANT
NEEDLE HYPO 22GX1.5 SAFETY (NEEDLE) ×2 IMPLANT
NS IRRIG 500ML POUR BTL (IV SOLUTION) ×2 IMPLANT
PACK BASIN DAY SURGERY FS (CUSTOM PROCEDURE TRAY) ×2 IMPLANT
PAD ABD 8X10 STRL (GAUZE/BANDAGES/DRESSINGS) IMPLANT
PAD CAST 3X4 CTTN HI CHSV (CAST SUPPLIES) IMPLANT
PAD CAST 4YDX4 CTTN HI CHSV (CAST SUPPLIES) IMPLANT
PADDING CAST ABS 4INX4YD NS (CAST SUPPLIES)
PADDING CAST ABS COTTON 4X4 ST (CAST SUPPLIES) IMPLANT
PADDING CAST COTTON 3X4 STRL (CAST SUPPLIES)
PADDING CAST COTTON 4X4 STRL (CAST SUPPLIES)
PENCIL BUTTON HOLSTER BLD 10FT (ELECTRODE) IMPLANT
SHEET MEDIUM DRAPE 40X70 STRL (DRAPES) IMPLANT
SPONGE LAP 18X18 X RAY DECT (DISPOSABLE) ×2 IMPLANT
STAPLER VISISTAT 35W (STAPLE) IMPLANT
STOCKINETTE 4X48 STRL (DRAPES) IMPLANT
STOCKINETTE 6  STRL (DRAPES)
STOCKINETTE 6 STRL (DRAPES) IMPLANT
STOCKINETTE IMPERVIOUS LG (DRAPES) IMPLANT
STRIP CLOSURE SKIN 1/2X4 (GAUZE/BANDAGES/DRESSINGS) IMPLANT
SURGILUBE 2OZ TUBE FLIPTOP (MISCELLANEOUS) IMPLANT
SUT MON AB 5-0 PS2 18 (SUTURE) IMPLANT
SUT SILK 3 0 SH CR/8 (SUTURE) IMPLANT
SUT SILK 4 0 SH CR/8 (SUTURE) IMPLANT
SUT VIC AB 3-0 FS2 27 (SUTURE) IMPLANT
SUT VIC AB 5-0 P-3 18X BRD (SUTURE) ×2 IMPLANT
SUT VIC AB 5-0 P3 18 (SUTURE) ×6
SUT VICRYL 4-0 PS2 18IN ABS (SUTURE) IMPLANT
SYR BULB 3OZ (MISCELLANEOUS) ×2 IMPLANT
SYR CONTROL 10ML LL (SYRINGE) ×2 IMPLANT
TAPE CLOTH SURG 4X10 WHT LF (GAUZE/BANDAGES/DRESSINGS) ×1 IMPLANT
TIP FLEX 45CM EVICEL (HEMOSTASIS) IMPLANT
TIP RIGID 35CM EVICEL (HEMOSTASIS) IMPLANT
TOWEL OR 17X24 6PK STRL BLUE (TOWEL DISPOSABLE) ×2 IMPLANT
TRAY DSU PREP LF (CUSTOM PROCEDURE TRAY) ×2 IMPLANT
TUBE CONNECTING 12X1/4 (SUCTIONS) IMPLANT
UNDERPAD 30X30 (UNDERPADS AND DIAPERS) ×2 IMPLANT
WATER STERILE IRR 1000ML POUR (IV SOLUTION) ×2 IMPLANT

## 2017-09-07 NOTE — Anesthesia Postprocedure Evaluation (Signed)
Anesthesia Post Note  Patient: [name]  Procedure(s) Performed: SKIN GRAFT SPLIT THICKNESS TO RIGHT KNEE WOUND WITH PLACEMENT OF VAC DRESSING TO GRAFT SITE (Right Knee)     Patient location during evaluation: PACU Anesthesia Type: General Level of consciousness: awake and alert Pain management: pain level controlled Vital Signs Assessment: post-procedure vital signs reviewed and stable Respiratory status: spontaneous breathing, nonlabored ventilation, respiratory function stable and patient connected to nasal cannula oxygen Cardiovascular status: blood pressure returned to baseline and stable Postop Assessment: no apparent nausea or vomiting Anesthetic complications: no    Last Vitals:  Vitals:   09/07/17 1045 09/07/17 1100  BP: (!) 164/76 (!) 167/80  Pulse: 89 88  Resp: 18 20  Temp:    SpO2: 99% 99%    Last Pain:  Vitals:   09/07/17 1100  TempSrc:   PainSc: 0-No pain                 Wally Behan S

## 2017-09-07 NOTE — Op Note (Signed)
First Assist Op Note: [name] assisted the Surgeon(s) _____Dr. Lyndee Leo Dillingham___ on the procedure(s): Split thickness skin graft of the right knee wound__on Date __4/15/19_______  [name] provided my assistance on this case as follows:  [name] was present and acted as first Environmental consultant during this operation. [name] was present during the patient transport into the operative suite and assisted the OR staff with transferring and positioning of the patient. All extremities were checked and properly cushioned and safety straps in place. [name] was involved in the prepping and placement of sterile drapes. A time out was performed and all information confirmed to be correct.  [name] first assisted during the case including retraction for exposure, assisting with closure of surgical wounds and application of sterile dressings. [name] provided assistance with application of post operative garments/splinting and assisted with patient transfer back to the stretcher as needed.   Soriah Leeman,PA-C Plastic Surgery [phone]

## 2017-09-07 NOTE — Anesthesia Procedure Notes (Signed)
Procedure Name: LMA Insertion Date/Time: 09/07/2017 9:37 AM Performed by: Myrtie Soman, MD Pre-anesthesia Checklist: Patient identified, Emergency Drugs available, Suction available and Patient being monitored Patient Re-evaluated:Patient Re-evaluated prior to induction Oxygen Delivery Method: Circle system utilized Preoxygenation: Pre-oxygenation with 100% oxygen Induction Type: IV induction Ventilation: Mask ventilation without difficulty LMA: LMA inserted LMA Size: 4.0 Number of attempts: 1 Airway Equipment and Method: Bite block Placement Confirmation: positive ETCO2 Tube secured with: Tape Dental Injury: Teeth and Oropharynx as per pre-operative assessment

## 2017-09-07 NOTE — H&P (Signed)
[name] is an 62 y.o. female.   Chief Complaint: right knee wound HPI: The patient is a 62 yrs old wf here for treatment of a wound on her right knee after joint replacement.  She had a large wound that has been doing very well with granulating.  The decision was made to bring her to the OR for definitive closure with a skin graft.  Past Medical History:  Diagnosis Date  . Arthritis   . 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 Heart murmur   . 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 MVP (mitral valve prolapse)    mild mvp w/ mild regurg. per last echo 04-24-2017  . Neuropathy due to chemotherapeutic drug ([place])   . Non-healing surgical wound    right knee post re-implantation total knee arthroplasty 04-2017 unable to completely close surgical incision  . PONV (postoperative nausea and vomiting)    ponv likes scopolamine patch  . Port-A-Cath in place    RIGHT CHEST   . Red blood cell antibody positive   . Skin changes related to chemotherapy    per patient she has scabbed over skin circumventing port to right upper chest ;  state it is due to chemptherapy   . 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 APPLICATION OF A-CELL OF BACK Right 07/31/2016   Procedure: CELLERATE COLLAGEN PLACEMENT;  Surgeon: Loel Lofty Denene Alamillo, DO;  Location: [place];  Service: Plastics;  Laterality: Right;  . APPLICATION OF WOUND VAC Right 06/29/2017   Procedure: APPLICATION OF WOUND VAC;  Surgeon: Wallace Going, DO;  Location: [facility];  Service: Plastics;  Laterality: Right;  . DEBRIDEMENT CHEST WALL RIGHT/ VAC PLACEMENT  02-09-2015    [facility]  . EXCISIONAL TOTAL KNEE ARTHROPLASTY WITH ANTIBIOTIC SPACERS Right 12/02/2016   Procedure: EXCISIONAL TOTAL KNEE ARTHROPLASTY WITH ANTIBIOTIC SPACERS;  Surgeon: Paralee Cancel, MD;  [facility];  Service: Orthopedics;  Laterality: Right;  90 mins  . HEMATOMA EVACUATION Right 06/29/2017   Procedure: EVACUATION HEMATOMA OF RIGHT KNEE;  Surgeon: Wallace Going, DO;  [facility];  Service: Plastics;  Laterality: Right;  . HEMATOMA EVACUATION Right 06/29/2017   Procedure: EVACUATION RIGHT TOTAL KNEE HEMATOMA;  Surgeon: Paralee Cancel, MD;  [facility];  Service: Orthopedics;  Laterality: Right;  . HEMATOMA EVACUATION Right 07/14/2017   Procedure: EVACUATION RIGHT LEG HEMATOMA WITH APPLICATION OF WOUND VAC;  Surgeon: Paralee Cancel, MD;  [facility];  Service: Orthopedics;  Laterality: Right;  . hemotoma     evacuation  left chest wall  . I&D EXTREMITY Right 07/17/2017   Procedure: EVACUATION RIGHT LEG HEMATOMA WITH WOUND VAC DRESSING CHANGE;  Surgeon: Paralee Cancel, MD;  [facility];  Service: Orthopedics;  Laterality: Right;  . I&D KNEE WITH POLY EXCHANGE Right 05/28/2016   Procedure: IRRIGATION AND DEBRIDEMENT KNEE WOUND VAC PLACMENT;  Surgeon: Susa Day, MD;  [facility];  Service: Orthopedics;  Laterality: Right;  . I&D KNEE WITH POLY EXCHANGE Right 05/30/2016   Procedure: RADICAL SYNOVECTOMY,IRRIGATION AND DEBRIDEMENT KNEE WITH  POLY EXCHANGE WITH ANTIBIOTIC BEADS, APPLICATION OF WOUND VAC;  Surgeon: Susa Day, MD;  [facility];  Service: Orthopedics;  Laterality: Right;  . INCISION AND DRAINAGE OF WOUND Right 07/31/2016   Procedure: IRRIGATION AND DEBRIDEMENT RIGHT KNEE  WOUND;  Surgeon: Loel Lofty Diamante Rubin, DO;  Location: [place];  Service: Plastics;  Laterality: Right;  . IR FLUORO GUIDE CV LINE LEFT  04/30/2017  . IR US GUIDE VASC ACCESS LEFT  04/30/2017  . IRRIGATION AND DEBRIDEMENT KNEE Right 04/12/2016   Procedure: IRRIGATION AND DEBRIDEMENT KNEE;  Surgeon: Nicholes Stairs, MD;  [facility];  Service: Orthopedics;  Laterality: Right;  . IRRIGATION AND DEBRIDEMENT KNEE Right 12/16/2016   Procedure: Repeat irrigation and debridement right knee, wound closure  wound vac REPLACEMENT ANTIBIOTIC SPACERS;  Surgeon: Paralee Cancel, MD;  [facility];  Service: Orthopedics;  Laterality: Right;  . KNEE ARTHROSCOPY  02/13/2012   Procedure: ARTHROSCOPY KNEE;  Surgeon: Johnn Hai, MD;  Location[facility];  Service: Orthopedics;  Laterality: Left;  WITH DEBRIDEMENt   . KNEE ARTHROSCOPY WITH LATERAL MENISECTOMY  02/13/2012   Procedure: KNEE ARTHROSCOPY WITH LATERAL MENISECTOMY;  Surgeon: Johnn Hai, MD;  Location: [facility];  Service: Orthopedics;;  partial  . LATISSIMUS FLAP RIGHT CHEST  02-23-2015   [facility]  . LEFT MODIFIED RADICAL MASTECTOMY/ RIGHT TOTAL MASTECTOMY  11-13-2007   LEFT BREAST CANCER W/ AXILLARY LYMPH NODE METASTASIS AND POST NEOADJUVANT CHEMO  . MASS EXCISION Right 06/29/2017   Procedure: EXCISION OF METASTATIC BREAST CANCER TO SKIN RIGHT SHOULDER, PLACEMENT OF CELLERATE;  Surgeon: Wallace Going, DO;  [facility];  Service: Plastics;  Laterality: Right;  . PLACEMENT PORT-A-CATH  06/22/2007    new port placed 2015, right chest  . REIMPLANTATION OF TOTAL KNEE Right 04/27/2017   Procedure: Reimplantation of right total knee arthroplasty;  Surgeon:  Paralee Cancel, MD;  [facility];  Service: Orthopedics;  Laterality: Right;  Adductor Block  . RESECTION OF ISOLATED CHEST WALL DISEASE/ ABDOMINAL SKIN FLAP  02/ 2015     [facility]   RIGHT CHEST WALL METASTATIC SKIN CARCINOMA  . SKIN SPLIT GRAFT Right 01/29/2017   Procedure: SKIN GRAFT SPLIT THICKNESS TO RIGHT KNEE WOUND;  Surgeon: Wallace Going, DO;  [facility];  Service: Plastics;  Laterality: Right;  . TOTAL KNEE ARTHROPLASTY Left 09/23/2012   Procedure: LEFT TOTAL KNEE ARTHROPLASTY;  Surgeon: Johnn Hai, MD;  [facility];  Service: Orthopedics;  Laterality: Left;  . TOTAL KNEE ARTHROPLASTY Right 03/20/2016   Procedure: RIGHT TOTAL KNEE ARTHROPLASTY;  Surgeon: Susa Day, MD;  [facility];  Service: Orthopedics;  Laterality: Right;  . TRANSTHORACIC ECHOCARDIOGRAM  04/24/2017   ef 98-33%, grade 1 diastolic dysfuction/  mild MR with mild prolapse anterior leaflet/ mild TR/ PASP 61mmHg    Family History  Problem Relation Age of Onset  . Heart disease Mother        due  to mitral valve regurgiation,   . Cancer Mother        breast  . Allergic rhinitis Mother   . Parkinsonism Father   . Allergic rhinitis Father   . Migraines Father   . Alcohol abuse Paternal Grandfather   . Angioedema Neg Hx   . Asthma Neg Hx   . Eczema Neg Hx    Social History:  reports that she has never smoked. She has never used smokeless tobacco. She reports that she does not drink alcohol or use drugs.  Allergies:  Allergies  Allergen Reactions  . Penicillins Hives, Itching and Rash    Has patient had a PCN reaction causing immediate rash, facial/tongue/throat swelling, SOB or lightheadedness with hypotension: yes Has patient had a PCN reaction causing severe rash involving mucus membranes or skin necrosis: No Has patient had a PCN reaction that required hospitalization No Has patient had a PCN reaction occurring within the last 10 years: yes If all of the above answers are "NO",  then may proceed with Cephalosporin use.   Denies airway involvement   . Other Rash    STERI STRIPS - Blisters  . Tape Hives    Can tolerate paper tape    Medications Prior to Admission  Medication Sig Dispense Refill  . acetaminophen (TYLENOL) 500 MG tablet Take 500 mg by mouth every 8 (eight) hours as needed for mild pain or headache.    . cetirizine (ZYRTEC) 10 MG tablet Take 1 tablet (10 mg total) daily by mouth. (Patient taking differently: Take 10 mg by mouth every morning. ) 30 tablet 11  . darifenacin (ENABLEX) 15 MG 24 hr tablet Take 15 mg by mouth every morning.   10  . docusate sodium (COLACE) 100 MG capsule Take 1 capsule (100 mg total) by mouth 2 (two) times daily. 10 capsule 0  . doxycycline (VIBRA-TABS) 100 MG tablet Take 100 mg by mouth 2 (two) times daily. 6 month course started 05-2017    . DULoxetine (CYMBALTA) 30 MG capsule Take 30 mg by mouth daily.  11  . enoxaparin (LOVENOX) 30 MG/0.3ML injection Inject 0.74 mLs (74 mg total) into the skin daily for 14 days. (Patient taking differently: Inject 1 mg/kg into the skin See admin instructions. Daily) 14 Syringe 1  . ferrous sulfate (FERROUSUL) 325 (65 FE) MG tablet Take 1 tablet (325 mg total) by mouth 3 (three) times daily with meals. (Patient taking differently: Take 325 mg by mouth 2 (two) times daily with a meal. ) 90 tablet 1  . gabapentin (NEURONTIN) 300 MG capsule TAKE 1 CAPSULE BY MOUTH 3 TIMES DAILY 90 capsule 6  . Multiple Vitamin (MULTIVITAMIN WITH MINERALS) TABS tablet Take 1 tablet by mouth daily.    . ondansetron (ZOFRAN) 8 MG tablet Take 1 tablet (8 mg total) by mouth every 8 (eight) hours as needed for nausea. (Patient taking differently: Take 8 mg by mouth every 8 (eight) hours as needed for nausea or vomiting. Uses after chemo treatments) 20 tablet 6  . oxyCODONE (OXY IR/ROXICODONE) 5 MG immediate release tablet Take 1-2 tablets (5-10 mg total) by mouth every 4 (four) hours as needed for moderate pain  ((score 4 to 6)). 60 tablet 0  . polyethylene glycol (MIRALAX / GLYCOLAX) packet Take 17 g by mouth 2 (two) times daily. (Patient taking differently: Take 17 g by mouth daily as needed for mild constipation. ) 14 each 0  . zolpidem (AMBIEN) 5 MG tablet TAKE 1 TABLET BY MOUTH ONCE  DAILY AT BEDTIME AS NEEDED FOR SLEEP 30 tablet 5  . Cholecalciferol (VITAMIN D3) 2000 UNITS TABS Take 2,000 Units by mouth 4 (four) times a week.       No results found for this or any previous visit (from the past 48 hour(s)). No results found.  Review of Systems  Constitutional: Negative.   HENT: Negative.   Eyes: Negative.   Respiratory: Negative.   Cardiovascular: Negative.   Gastrointestinal: Negative.   Genitourinary: Negative.   Musculoskeletal: Negative.   Skin:       Breast cancer mets to skin  Psychiatric/Behavioral: Negative.     Blood pressure (!) 141/70, pulse 99, temperature 98.6 F (37 C), temperature source Oral, resp. rate 18, height 5\' 7"  (1.702 m), weight 75.9 kg (167 lb 6.4 oz), SpO2 97 %. Physical Exam  Constitutional: She appears well-developed and well-nourished.  HENT:  Head: Normocephalic and atraumatic.  Eyes: Pupils are equal, round, and reactive to light. EOM are normal.  Cardiovascular: Normal rate.  Respiratory: Effort normal. No respiratory distress.    GI: Soft. She exhibits no distension. There is no tenderness.  Musculoskeletal: She exhibits edema and tenderness.       Legs: Neurological: She is alert.  Skin: Skin is warm. There is erythema.  Psychiatric: She has a normal mood and affect.     Assessment/Plan Plan for skin graft to the right knee wound.  [place], DO 09/07/2017, 7:03 AM

## 2017-09-07 NOTE — Anesthesia Preprocedure Evaluation (Signed)
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, right [facility]) followed by dr force -- oncolgoist w/ [place] primary left breast cancer dx 01/ 2009 ; 11/ 2009 recurrence left chest wall and neck, tx clinical trial drug and chemotherapy;  2015 recurrence cutaneous metastatized to right skin/ chest wall, 02/ 2015 resection chest wall disease and 02-23-2015 s/p skin flap surgery,  chemo every 3 weeks   Reviewed: Allergy & Precautions, NPO status , Patient's Chart, lab work & pertinent test results  History of Anesthesia Complications (+) PONV  Airway Mallampati: II  TM Distance: >3 FB Neck ROM: Full    Dental no notable dental hx.    Pulmonary neg pulmonary ROS,    Pulmonary exam normal breath sounds clear to auscultation       Cardiovascular negative cardio ROS Normal cardiovascular exam Rhythm:Regular Rate:Normal     Neuro/Psych negative neurological ROS  negative psych ROS   GI/Hepatic negative GI ROS, Neg liver ROS,   Endo/Other  negative endocrine ROS  Renal/GU negative Renal ROS  negative genitourinary   Musculoskeletal negative musculoskeletal ROS (+)   Abdominal   Peds negative pediatric ROS (+)  Hematology negative hematology ROS (+)   Anesthesia Other Findings   Reproductive/Obstetrics negative OB ROS                             Anesthesia Physical Anesthesia Plan  ASA: III  Anesthesia Plan: General   Post-op Pain Management:    Induction: Intravenous  PONV Risk Score and Plan: 4 or greater and Ondansetron, Dexamethasone and Treatment may vary due to age or medical condition  Airway Management Planned: LMA  Additional Equipment:   Intra-op Plan:   Post-operative Plan: Extubation in OR  Informed Consent: I have reviewed the patients History and Physical,  chart, labs and discussed the procedure including the risks, benefits and alternatives for the proposed anesthesia with the patient or authorized representative who has indicated his/her understanding and acceptance.   Dental advisory given  Plan Discussed with: CRNA and Surgeon  Anesthesia Plan Comments:         Anesthesia Quick Evaluation

## 2017-09-07 NOTE — Transfer of Care (Signed)
  Last Vitals:  Vitals Value Taken Time  BP    Temp    Pulse 91 09/07/2017 10:38 AM  Resp 16 09/07/2017 10:38 AM  SpO2 100 % 09/07/2017 10:38 AM  Vitals shown include unvalidated device data.  Last Pain:  Vitals:   09/07/17 0726  TempSrc:   PainSc: 0-No pain         Immediate Anesthesia Transfer of Care Note  Patient: [name]  Procedure(s) Performed: Procedure(s) (LRB): SKIN GRAFT SPLIT THICKNESS TO RIGHT KNEE WOUND WITH PLACEMENT OF VAC DRESSING TO GRAFT SITE (Right)  Patient Location: PACU  Anesthesia Type: General  Level of Consciousness: awake, alert  and oriented  Airway & Oxygen Therapy: Patient Spontanous Breathing and Patient connected to face mask oxygen  Post-op Assessment: Report given to PACU RN and Post -op Vital signs reviewed and stable  Post vital signs: Reviewed and stable  Complications: No apparent anesthesia complications

## 2017-09-07 NOTE — Op Note (Addendum)
DATE OF OPERATION: 09/07/2017  LOCATION: [facility]  PREOPERATIVE DIAGNOSIS: right knee wound  POSTOPERATIVE DIAGNOSIS: Same  PROCEDURE: Split thickness skin graft to right knee wound 2 x 4 cm with VAC placement  SURGEON: Claire Sanger Dillingham, DO  ASSISTANT: Shawn Rayburn, PA  EBL: 2 cc  CONDITION: Stable  COMPLICATIONS: None  INDICATION: The patient, [name], is a 62 y.o. female born on 09-07-1955, is here for treatment of a chronic right knee wound with implant underneath from knee replacement.   PROCEDURE DETAILS:  The patient was seen prior to surgery and marked.  The IV antibiotics were given. The patient was taken to the operating room and given a general anesthetic. A standard time out was performed and all information was confirmed by those in the room. SCD not used due to blood pressure cuff.   The right leg and thigh were prepped and draped.  The local was injected into the right thigh 2 x 4 cm area for the donor site.  The dermatome was set at 04/999 inch.  The skin was placed from the thigh to the right knee. The graft was secured to the skin with the 5-0 Vicryl.  Several slits were placed for drainage and prevention of hematoma.  The adaptic was placed over the graft and the VAC placed at 100 mmHg continuous pressure.   The xeroform was placed on the right thigh with a sterile dressing.  The left was wrapped with kerlex and ace and placed in a knee immobilizer.  The patient was allowed to wake up and taken to recovery room in stable condition at the end of the case. The family was notified at the end of the case.  The advanced practice practitioner assisted throughout the case.  The APP was essential in retraction and counter traction when needed to make the case progress smoothly.  The assistance was needed for blood control, tissue re-approximation and assisted with closure.

## 2017-09-07 NOTE — Discharge Instructions (Signed)
Keep VAC in place Keep knee immobilized Change thigh dressing on the outer gauze daily.  Leave xeroform in place May start lovenox back tomorrow  Call your surgeon if you experience:   1.  Fever over 101.0. 2.  Nausea and/or vomiting. 3  Continued bleeding from the incision. 4  Increased pain, redness or drainage from the incision. 5  Any problems and/or concerns  Post Anesthesia Home Care Instructions  Activity: Get plenty of rest for the remainder of the day. A responsible individual must stay with you for 24 hours following the procedure.  For the next 24 hours, DO NOT: -Drive a car -Paediatric nurse -Drink alcoholic beverages -Take any medication unless instructed by your physician -Make any legal decisions or sign important papers.  Meals: Start with liquid foods such as gelatin or soup. Progress to regular foods as tolerated. Avoid greasy, spicy, heavy foods. If nausea and/or vomiting occur, drink only clear liquids until the nausea and/or vomiting subsides. Call your physician if vomiting continues.  Special Instructions/Symptoms: Your throat may feel dry or sore from the anesthesia or the breathing tube placed in your throat during surgery. If this causes discomfort, gargle with warm salt water. The discomfort should disappear within 24 hours.  If you had a scopolamine patch placed behind your ear for the management of post- operative nausea and/or vomiting:  1. The medication in the patch is effective for 72 hours, after which it should be removed.  Wrap patch in a tissue and discard in the trash. Wash hands thoroughly with soap and water. 2. You may remove the patch earlier than 72 hours if you experience unpleasant side effects which may include dry mouth, dizziness or visual disturbances. 3. Avoid touching the patch. Wash your hands with soap and water after contact with the patch.

## 2017-09-07 NOTE — Interval H&P Note (Signed)
History and Physical Interval Note:  09/07/2017 9:21 AM  [name]  has presented today for surgery, with the diagnosis of Non-healing surgical wound, subsequent encounter  The various methods of treatment have been discussed with the patient and family. After consideration of risks, benefits and other options for treatment, the patient has consented to  Procedure(s): SKIN GRAFT SPLIT THICKNESS TO RIGHT KNEE WOUND WITH PLACEMENT OF VAC DRESSING TO GRAFT SITE (Right) as a surgical intervention .  The patient's history has been reviewed, patient examined, no change in status, stable for surgery.  I have reviewed the patient's chart and labs.  Questions were answered to the patient's satisfaction.     Loel Lofty Melis Trochez

## 2017-09-08 ENCOUNTER — Encounter (HOSPITAL_BASED_OUTPATIENT_CLINIC_OR_DEPARTMENT_OTHER): Payer: Self-pay | Admitting: Plastic Surgery

## 2017-09-08 DIAGNOSIS — C50412 Malignant neoplasm of upper-outer quadrant of left female breast: Secondary | ICD-10-CM | POA: Diagnosis not present

## 2017-09-08 DIAGNOSIS — Z5112 Encounter for antineoplastic immunotherapy: Secondary | ICD-10-CM | POA: Diagnosis not present

## 2017-09-08 DIAGNOSIS — C792 Secondary malignant neoplasm of skin: Secondary | ICD-10-CM | POA: Diagnosis not present

## 2017-09-11 DIAGNOSIS — D649 Anemia, unspecified: Secondary | ICD-10-CM | POA: Diagnosis not present

## 2017-09-11 DIAGNOSIS — Z7901 Long term (current) use of anticoagulants: Secondary | ICD-10-CM | POA: Diagnosis not present

## 2017-09-11 DIAGNOSIS — I749 Embolism and thrombosis of unspecified artery: Secondary | ICD-10-CM | POA: Diagnosis not present

## 2017-09-11 MED FILL — ENOXAPARIN 30 MG/0.3 ML SYR: 30 | 20 days supply | Qty: 6 | Fill #0

## 2017-09-14 ENCOUNTER — Encounter: Payer: Self-pay | Admitting: Physical Therapy

## 2017-09-14 ENCOUNTER — Other Ambulatory Visit: Payer: Self-pay | Admitting: Family Medicine

## 2017-09-14 MED FILL — DOXYCYCLINE MONO 100 MG CAP: 100 | 30 days supply | Qty: 60 | Fill #3

## 2017-09-14 MED FILL — GABAPENTIN 300 MG CAPSULE: 300 | 30 days supply | Qty: 150 | Fill #0

## 2017-09-15 ENCOUNTER — Encounter: Payer: Self-pay | Admitting: *Deleted

## 2017-09-15 DIAGNOSIS — T8189XA Other complications of procedures, not elsewhere classified, initial encounter: Secondary | ICD-10-CM | POA: Diagnosis not present

## 2017-09-15 NOTE — Telephone Encounter (Signed)
Last Fill: 10/02/16 #30, 5 Last OV: 04/03/17 (Acute), 07/31/16 (CPE) Note added to this RX that appointment is due for future refills.

## 2017-09-15 NOTE — Telephone Encounter (Signed)
Message has been sent to patient to schedule appointment for further refills

## 2017-09-16 ENCOUNTER — Ambulatory Visit: Payer: 59 | Admitting: Physical Therapy

## 2017-09-16 ENCOUNTER — Other Ambulatory Visit: Payer: Self-pay | Admitting: Family Medicine

## 2017-09-16 NOTE — Telephone Encounter (Signed)
OV: 07/24/2016 Fill Date: 09/2016

## 2017-09-16 NOTE — Telephone Encounter (Signed)
Last OV 04/03/17, No future OV  Last filled 10/02/16, # 30 with 5 refills

## 2017-09-17 ENCOUNTER — Other Ambulatory Visit: Payer: Self-pay | Admitting: General Practice

## 2017-09-17 MED ORDER — ZOLPIDEM TARTRATE 5 MG PO TABS: ORAL_TABLET | ORAL | 0 refills | Status: DC

## 2017-09-17 MED FILL — ZOLPIDEM TARTRATE 5 MG TAB: 5 | 10 days supply | Qty: 10 | Fill #0

## 2017-09-21 ENCOUNTER — Encounter: Payer: Self-pay | Admitting: Physical Therapy

## 2017-09-22 DIAGNOSIS — T8453XD Infection and inflammatory reaction due to internal right knee prosthesis, subsequent encounter: Secondary | ICD-10-CM | POA: Diagnosis not present

## 2017-09-22 DIAGNOSIS — C50912 Malignant neoplasm of unspecified site of left female breast: Secondary | ICD-10-CM | POA: Diagnosis not present

## 2017-09-22 DIAGNOSIS — C50412 Malignant neoplasm of upper-outer quadrant of left female breast: Secondary | ICD-10-CM | POA: Diagnosis not present

## 2017-09-22 DIAGNOSIS — I749 Embolism and thrombosis of unspecified artery: Secondary | ICD-10-CM | POA: Diagnosis not present

## 2017-09-22 DIAGNOSIS — I5020 Unspecified systolic (congestive) heart failure: Secondary | ICD-10-CM | POA: Diagnosis not present

## 2017-09-22 DIAGNOSIS — C792 Secondary malignant neoplasm of skin: Secondary | ICD-10-CM | POA: Diagnosis not present

## 2017-09-22 DIAGNOSIS — G629 Polyneuropathy, unspecified: Secondary | ICD-10-CM | POA: Diagnosis not present

## 2017-09-22 DIAGNOSIS — R7989 Other specified abnormal findings of blood chemistry: Secondary | ICD-10-CM | POA: Diagnosis not present

## 2017-09-22 DIAGNOSIS — T86821 Skin graft (allograft) (autograft) failure: Secondary | ICD-10-CM | POA: Diagnosis not present

## 2017-09-24 ENCOUNTER — Ambulatory Visit: Payer: 59 | Attending: Orthopedic Surgery

## 2017-09-24 DIAGNOSIS — R600 Localized edema: Secondary | ICD-10-CM

## 2017-09-24 DIAGNOSIS — M25661 Stiffness of right knee, not elsewhere classified: Secondary | ICD-10-CM

## 2017-09-24 DIAGNOSIS — M25561 Pain in right knee: Secondary | ICD-10-CM | POA: Diagnosis not present

## 2017-09-24 DIAGNOSIS — M6281 Muscle weakness (generalized): Secondary | ICD-10-CM | POA: Diagnosis not present

## 2017-09-24 DIAGNOSIS — R2689 Other abnormalities of gait and mobility: Secondary | ICD-10-CM | POA: Diagnosis not present

## 2017-09-24 NOTE — Therapy (Signed)
[facility] [address] [place], [place], [address] Phone: [phone]   Fax:  [phone]  Physical Therapy Treatment  Patient Details  Name: [name] MRN: [id number] Date of Birth: May 23, 1956 Referring Provider: Dr. Paralee Cancel   Encounter Date: 09/24/2017  PT End of Session - 09/24/17 1016    Visit Number  3    Number of Visits  12    Date for PT Re-Evaluation  10/14/17    Authorization Type  [facility] UMR/[employer] (prior auth required for secondary insurance); 24 visits total approved (9 used prior to 09/02/17 visit); will need to request additional visits with review from insurance if needed past 24 visits    Authorization - Visit Number  12    Authorization - Number of Visits  24    PT Start Time  0931    PT Stop Time  1014    PT Time Calculation (min)  43 min    Activity Tolerance  Patient tolerated treatment well    Behavior During Therapy  [facility] for tasks assessed/performed       Past Medical History:  Diagnosis Date  . Arthritis   . 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 Heart murmur   . 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 MVP (mitral valve prolapse)    mild mvp w/ mild  regurg. per last echo 04-24-2017  . Neuropathy due to chemotherapeutic drug ([place])   . Non-healing surgical wound    right knee post re-implantation total knee arthroplasty 04-2017 unable to completely close surgical incision  . PONV (postoperative nausea and vomiting)    ponv likes scopolamine patch  . Port-A-Cath in place    RIGHT CHEST   . Red blood cell antibody positive   . Skin changes related to chemotherapy    per patient she has scabbed over skin circumventing port to right upper chest ;  state it is due to chemptherapy   . 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 APPLICATION OF A-CELL OF BACK Right 07/31/2016   Procedure: CELLERATE COLLAGEN PLACEMENT;  Surgeon: Loel Lofty Dillingham, DO;  Location: [facility];  Service: Plastics;  Laterality: Right;  . APPLICATION OF WOUND VAC Right 06/29/2017   Procedure: APPLICATION OF WOUND VAC;  Surgeon: Wallace Going, DO;  Location: [facility];  Service: Plastics;  Laterality: Right;  . DEBRIDEMENT CHEST WALL RIGHT/ VAC PLACEMENT  02-09-2015    [facility]  . EXCISIONAL TOTAL KNEE ARTHROPLASTY WITH ANTIBIOTIC SPACERS Right 12/02/2016   Procedure: EXCISIONAL TOTAL  KNEE ARTHROPLASTY WITH ANTIBIOTIC SPACERS;  Surgeon: Paralee Cancel, MD;  [facility];  Service: Orthopedics;  Laterality: Right;  90 mins  . HEMATOMA EVACUATION Right 06/29/2017   Procedure: EVACUATION HEMATOMA OF RIGHT KNEE;  Surgeon: Wallace Going, DO;  [facility];  Service: Plastics;  Laterality: Right;  . HEMATOMA EVACUATION Right 06/29/2017   Procedure: EVACUATION RIGHT TOTAL KNEE HEMATOMA;  Surgeon: Paralee Cancel, MD;  [facility];  Service: Orthopedics;  Laterality: Right;  . HEMATOMA EVACUATION Right 07/14/2017   Procedure: EVACUATION RIGHT LEG HEMATOMA WITH APPLICATION OF WOUND VAC;  Surgeon: Paralee Cancel, MD;  [facility];  Service: Orthopedics;  Laterality: Right;  . hemotoma     evacuation left chest wall  . I&D EXTREMITY Right 07/17/2017   Procedure: EVACUATION RIGHT LEG HEMATOMA WITH WOUND VAC DRESSING CHANGE;  Surgeon: Paralee Cancel, MD;  [facility];  Service: Orthopedics;  Laterality: Right;  . I&D KNEE WITH POLY EXCHANGE Right 05/28/2016   Procedure: IRRIGATION AND DEBRIDEMENT KNEE WOUND VAC PLACMENT;  Surgeon: Susa Day, MD;  [facility];  Service: Orthopedics;  Laterality: Right;  . I&D KNEE WITH POLY EXCHANGE Right 05/30/2016   Procedure: RADICAL SYNOVECTOMY,IRRIGATION AND DEBRIDEMENT KNEE WITH POLY EXCHANGE WITH ANTIBIOTIC BEADS, APPLICATION OF WOUND VAC;  Surgeon: Susa Day, MD;  [facility];  Service: Orthopedics;  Laterality: Right;  . INCISION AND DRAINAGE OF WOUND Right 07/31/2016   Procedure: IRRIGATION AND DEBRIDEMENT RIGHT KNEE  WOUND;  Surgeon: Loel Lofty Dillingham, DO;  Location: [place];  Service: Plastics;  Laterality: Right;  . IR FLUORO GUIDE CV LINE LEFT  04/30/2017  . IR US GUIDE VASC ACCESS LEFT  04/30/2017  . IRRIGATION AND DEBRIDEMENT KNEE Right 04/12/2016   Procedure: IRRIGATION AND DEBRIDEMENT KNEE;  Surgeon: Nicholes Stairs, MD;  [facility];  Service: Orthopedics;  Laterality: Right;  . IRRIGATION AND DEBRIDEMENT KNEE Right 12/16/2016   Procedure: Repeat irrigation and debridement right knee, wound closure  wound vac REPLACEMENT ANTIBIOTIC SPACERS;  Surgeon: Paralee Cancel, MD;  [facility];  Service: Orthopedics;  Laterality: Right;  . KNEE ARTHROSCOPY  02/13/2012   Procedure: ARTHROSCOPY KNEE;  Surgeon: Johnn Hai, MD;  Location[facility];  Service: Orthopedics;  Laterality: Left;  WITH DEBRIDEMENt   . KNEE ARTHROSCOPY WITH LATERAL MENISECTOMY  02/13/2012   Procedure: KNEE ARTHROSCOPY WITH LATERAL MENISECTOMY;  Surgeon: Johnn Hai, MD;  Location: [facility];  Service: Orthopedics;;   partial  . LATISSIMUS FLAP RIGHT CHEST  02-23-2015   [facility]  . LEFT MODIFIED RADICAL MASTECTOMY/ RIGHT TOTAL MASTECTOMY  11-13-2007   LEFT BREAST CANCER W/ AXILLARY LYMPH NODE METASTASIS AND POST NEOADJUVANT CHEMO  . MASS EXCISION Right 06/29/2017   Procedure: EXCISION OF METASTATIC BREAST CANCER TO SKIN RIGHT SHOULDER, PLACEMENT OF CELLERATE;  Surgeon: Wallace Going, DO;  [facility];  Service: Plastics;  Laterality: Right;  . PLACEMENT PORT-A-CATH  06/22/2007    new port placed 2015, right chest  . REIMPLANTATION OF TOTAL KNEE Right 04/27/2017   Procedure: Reimplantation of right total knee arthroplasty;  Surgeon: Paralee Cancel, MD;  [facility];  Service: Orthopedics;  Laterality: Right;  Adductor Block  . RESECTION OF ISOLATED CHEST WALL DISEASE/ ABDOMINAL SKIN FLAP  02/ 2015     [facility]   RIGHT CHEST WALL METASTATIC SKIN CARCINOMA  . SKIN SPLIT GRAFT Right 01/29/2017   Procedure: SKIN GRAFT SPLIT THICKNESS TO RIGHT KNEE WOUND;  Surgeon:  Dillingham, Loel Lofty, DO;  Location[facility];  Service: Plastics;  Laterality: Right;  . SKIN SPLIT GRAFT Right 09/07/2017   Procedure: SKIN GRAFT SPLIT THICKNESS TO RIGHT KNEE WOUND WITH PLACEMENT OF VAC DRESSING TO GRAFT SITE;  Surgeon: Wallace Going, DO;  Location: [facility];  Service: Plastics;  Laterality: Right;  . TOTAL KNEE ARTHROPLASTY Left 09/23/2012   Procedure: LEFT TOTAL KNEE ARTHROPLASTY;  Surgeon: Johnn Hai, MD;  [facility];  Service: Orthopedics;  Laterality: Left;  . TOTAL KNEE ARTHROPLASTY Right 03/20/2016   Procedure: RIGHT TOTAL KNEE ARTHROPLASTY;  Surgeon: Susa Day, MD;  [facility];  Service: Orthopedics;  Laterality: Right;  . TRANSTHORACIC ECHOCARDIOGRAM  04/24/2017   ef 83-38%, grade 1 diastolic dysfuction/  mild MR with mild prolapse anterior leaflet/ mild TR/ PASP 71mmHg    There were no vitals filed for this visit.  Subjective Assessment - 09/24/17 0919    Subjective  Lapse  in treatment.  Pt had split thickness graft on 09/07/17.  Pt called MD yesterday and they told her no restrictions.      Currently in Pain?  Yes    Pain Score  1     Pain Location  Knee    Pain Orientation  Right    Pain Descriptors / Indicators  Pressure    Pain Onset  More than a month ago    Pain Frequency  Intermittent    Aggravating Factors   walking, standing, bending the knee    Pain Relieving Factors  medication, ice, elevation                       [facility] Adult PT Treatment/Exercise - 09/24/17 0001      Knee/Hip Exercises: Aerobic   Nustep  L3 x 10 min      Knee/Hip Exercises: Seated   Long Arc Quad  Both;20 reps    [employer]  20 reps    Marching  Both;20 reps;Weights    Marching Weights  2 lbs.    Hamstring Curl  Strengthening      Knee/Hip Exercises: Supine   Quad Sets  Strengthening;Right;2 sets;5 sets    Straight Leg Raises  Strengthening;Right;2 sets;5 reps      Knee/Hip Exercises: Prone   Straight Leg Raises  Strengthening;Right;2 sets;10 reps             PT Education - 09/24/17 1008    Education provided  Yes    Education Details  [email or web address] access code    Person(s) Educated  Patient    Methods  Explanation;Demonstration;Handout    Comprehension  Verbalized understanding;Returned demonstration          PT Long Term Goals - 09/02/17 1024      PT LONG TERM GOAL #1   Title  independent with HEP    Status  New    Target Date  10/14/17      PT LONG TERM GOAL #2   Title  demonstrate improved functional strength by performing 5x STS without UE support in < 16 sec    Status  New    Target Date  10/14/17      PT LONG TERM GOAL #3   Title  improve functional endurance by performing 3MWT with at least 700'     Status  New    Target Date  10/14/17      PT LONG TERM GOAL #4   Title  FOTO score improved  to </= 40% limitation for improved function    Status  New    Target Date  10/14/17      PT LONG TERM GOAL #5   Title  report  pain < 4/10 with activity for improved mobility and function    Status  New    Target Date  10/14/17            Plan - 09/24/17 0941    Clinical Impression Statement  Pt had skin graft 09/07/17 on Rt knee and has been healing from this.  Pt now is able to resume activity.  Pt will see Dr Alvan Dame 09/30/17.  Pt demosntrates wide base of support and limited Rt LE A/ROM with gait.  Pt remains chronically weak due to multiple medical complications associated with metastatic cancer and intestinal distress from recent Chemotherapy.  Pt will contine to benefit from skilled PT for LE strength and endurance progression.      Rehab Potential  Good    PT Frequency  2x / week    PT Duration  6 weeks    PT Treatment/Interventions  ADLs/Self Care Home Management;Cryotherapy;Moist Heat;Therapeutic exercise;Therapeutic activities;Functional mobility training;Stair training;Gait training;Balance training;Neuromuscular re-education;Patient/family education;Manual techniques;Vasopneumatic Device;Taping;Passive range of motion    PT Next Visit Plan  review standing HEP for balance/strengthening, work on endurance (walking program, use of machines)    PT Home Exercise Plan  Access Code: [email or web address]     Consulted and Agree with Plan of Care  Patient       Patient will benefit from skilled therapeutic intervention in order to improve the following deficits and impairments:  Abnormal gait, Pain, Decreased strength, Decreased activity tolerance, Decreased balance, Decreased mobility, Decreased range of motion  Visit Diagnosis: Acute pain of right knee  Stiffness of right knee, not elsewhere classified  Muscle weakness (generalized)  Other abnormalities of gait and mobility  Localized edema     Problem List Patient Active Problem List   Diagnosis Date Noted  . Wound, open with complication 91/47/8295  . Fever   . Anemia due to chemotherapy 05/07/2017  . Abnormal LFTs 05/07/2017  . History of breast cancer    . Bilateral leg edema   . Enterococcal infection   . S/P revision of total knee, right 04/27/2017  . Pyogenic arthritis of right knee joint ([place])   . Acute blood loss anemia 12/12/2016  . Right knee abx spacer 12/02/2016  . Physical exam 07/31/2016  . Allergic reaction 07/24/2016  . Drug rash 07/24/2016  . S/P total knee arthroplasty, right 05/31/2016  . 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 Nonhealing surgical wound 05/28/2016  . Sepsis ([place]) 05/10/2016  . UTI (urinary tract infection) 05/10/2016  . Cellulitis of right knee 05/10/2016  . Blood clot in vein   . Wound dehiscence, surgical 04/12/2016  . 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 Vitamin D deficiency 01/25/2016  . 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 Primary osteoarthritis of right knee 02/01/2015  . Symptomatic anemia 02/01/2015  . Frequent UTI 01/12/2014  . Sinus tachycardia 11/03/2013  . SOB (shortness of breath) 11/03/2013  . Postoperative anemia due to acute blood loss 12/24/2012  .    09/23/2012  . Sinusitis 07/28/2012  . Neuropathy due to chemotherapeutic drug ([place]) 07/25/2011  . 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 Metastatic breast cancer ([place]) 11/12/2010  . Chronic anticoagulation 11/12/2010  . Anxiety, generalized 03/26/2008    Sigurd Sos, PT 09/24/17 10:17 AM   [facility] [address], [place], [place], [address] Phone: [phone]   Fax:  [phone]  Name: [name] MRN: [id number] Date of Birth: [birth year]

## 2017-09-24 NOTE — Patient Instructions (Addendum)
Access Code: [email or web address]  URL: [email or web address]  Date: 09/24/2017  Prepared by: Sigurd Sos   Exercises  Supine Quad Set - 10 reps - 2 sets - 5 hold - 2x daily - 7x weekly  Seated Hip Adduction Isometrics with Ball - 10 reps - 2 sets - 5 hold - 3x daily - 7x weekly  Seated Hip Abduction with Resistance - 10 reps - 3 sets - 3x daily - 7x weekly  Seated March - 10 reps - 2 sets - 3x daily - 7x weekly  Seated Long Arc Quad - 10 reps - 2 sets - 5 hold - 3x daily - 7x weekly   [facility] [address] [place], [place] [address] Phone # [phone] Fax [phone]

## 2017-09-27 ENCOUNTER — Ambulatory Visit (INDEPENDENT_AMBULATORY_CARE_PROVIDER_SITE_OTHER): Payer: Self-pay | Admitting: Family Medicine

## 2017-09-27 DIAGNOSIS — R350 Frequency of micturition: Secondary | ICD-10-CM

## 2017-09-27 DIAGNOSIS — N3000 Acute cystitis without hematuria: Secondary | ICD-10-CM

## 2017-09-27 LAB — POCT URINALYSIS DIPSTICK
BILIRUBIN UA: NEGATIVE
Blood, UA: NEGATIVE
Glucose, UA: NEGATIVE
Ketones, UA: NEGATIVE
NITRITE UA: NEGATIVE
PH UA: 5 (ref 5.0–8.0)
SPEC GRAV UA: 1.015 (ref 1.010–1.025)
UROBILINOGEN UA: NEGATIVE U/dL — AB

## 2017-09-27 MED ORDER — NITROFURANTOIN MONOHYD MACRO 100 MG PO CAPS: 100.0000 mg | ORAL_CAPSULE | Freq: Two times a day (BID) | ORAL | 0 refills | Status: AC

## 2017-09-27 NOTE — Progress Notes (Signed)
[name] is a 62 y.o. female who presents today with concerns of urinary frequency and urgency.  Patient reports recent new chemotherapy drug with known side effect of diarrhea.  Patient experienced 5 days of profuse diarrhea that she describes as watery.  Despite efforts of consistent wiping and cleaning with baby wipes patient reports in the last 24 hours a fever treated with Tylenol T-max 100.4.  Patient reports urgency and frequency but denies hematuria.  Review of Systems  Constitutional: Negative for chills, fever and malaise/fatigue.  HENT: Negative for congestion, ear discharge, ear pain, sinus pain and sore throat.   Eyes: Negative.   Respiratory: Negative for cough, sputum production and shortness of breath.   Cardiovascular: Negative.  Negative for chest pain.  Gastrointestinal: Negative for abdominal pain, diarrhea, nausea and vomiting.  Genitourinary: Positive for dysuria, frequency and urgency. Negative for flank pain and hematuria.  Musculoskeletal: Negative for myalgias.  Skin: Negative.   Neurological: Negative for headaches.  Endo/Heme/Allergies: Negative.   Psychiatric/Behavioral: Negative.     O: Vitals:   09/27/17 1141  BP: 122/80  Pulse: (!) 101  Temp: 98.5 F (36.9 C)  SpO2: 95%     Physical Exam  Constitutional: She is oriented to person, place, and time. Vital signs are normal. She appears well-developed and well-nourished. She is active.  Non-toxic appearance. She does not have a sickly appearance.  HENT:  Head: Normocephalic.  Right Ear: Hearing, tympanic membrane, external ear and ear canal normal.  Left Ear: Hearing, tympanic membrane, external ear and ear canal normal.  Nose: Nose normal.  Mouth/Throat: Uvula is midline and oropharynx is clear and moist.  Neck: Normal range of motion. Neck supple.  Cardiovascular: Regular rhythm, normal heart sounds and normal pulses. Tachycardia present.  Pt reports tachycardic since Chemo initiation   Pulmonary/Chest: Effort normal and breath sounds normal.  Abdominal: Soft. Bowel sounds are normal.  Denies flank or pelvic pain when palpated  Musculoskeletal: Normal range of motion.  Lymphadenopathy:       Head (right side): No submental and no submandibular adenopathy present.       Head (left side): No submental and no submandibular adenopathy present.    She has no cervical adenopathy.  Neurological: She is alert and oriented to person, place, and time.  Psychiatric: She has a normal mood and affect.  Vitals reviewed.    A: 1. Acute cystitis without hematuria   2. Urinary frequency      P: Discussed with patient if symptoms are not improved due to inability to obtain culture and risk factor of current cancer diagnosis and treatment future treatment may consist of fluoroquinolone to ensure microbe eradication. Will call in 48 hours to assess symptoms and patient provided direct line for contact if needed.  Exam findings, diagnosis etiology and medication use and indications reviewed with patient. Follow- Up and discharge instructions provided. No emergent/urgent issues found on exam.  Patient verbalized understanding of information provided and agrees with plan of care (POC), all questions answered.  1. Acute cystitis without hematuria - nitrofurantoin, macrocrystal-monohydrate, (MACROBID) 100 MG capsule; Take 1 capsule (100 mg total) by mouth 2 (two) times daily for 7 days.  2. Urinary frequency - POCT Urinalysis Dipstick Results for orders placed or performed in visit on 09/27/17 (from the past 24 hour(s))  POCT Urinalysis Dipstick     Status: Abnormal   Collection Time: 09/27/17 11:51 AM  Result Value Ref Range   Color, UA     Clarity, UA  Glucose, UA neg    Bilirubin, UA neg    Ketones, UA neg    Spec Grav, UA 1.015 1.010 - 1.025   Blood, UA neg    pH, UA 5.0 5.0 - 8.0   Protein, UA trace    Urobilinogen, UA negative (A) 0.2 or 1.0 E.U./dL   Nitrite, UA neg     Leukocytes, UA Large (3+) (A) Negative   Appearance     Odor       Other orders - diphenoxylate-atropine (LOMOTIL) 2.5-0.025 MG tablet; diphenoxylate-atropine 2.5 mg-0.025 mg tablet  TAKE 1 TABLET BY MOUTH 4 (FOUR) TIMES DAILY AS NEEDED FOR DIARRHEA - prochlorperazine (COMPAZINE) 10 MG tablet; TAKE 1 TABLET (10 MG TOTAL) BY MOUTH EVERY 6 (SIX) HOURS AS NEEDED FOR NAUSEA

## 2017-09-27 NOTE — Patient Instructions (Addendum)

## 2017-09-28 ENCOUNTER — Ambulatory Visit: Payer: 59

## 2017-09-28 ENCOUNTER — Encounter: Payer: Self-pay | Admitting: Family Medicine

## 2017-09-28 DIAGNOSIS — M25661 Stiffness of right knee, not elsewhere classified: Secondary | ICD-10-CM | POA: Diagnosis not present

## 2017-09-28 DIAGNOSIS — M6281 Muscle weakness (generalized): Secondary | ICD-10-CM | POA: Diagnosis not present

## 2017-09-28 DIAGNOSIS — R600 Localized edema: Secondary | ICD-10-CM | POA: Diagnosis not present

## 2017-09-28 DIAGNOSIS — M25561 Pain in right knee: Secondary | ICD-10-CM

## 2017-09-28 DIAGNOSIS — R2689 Other abnormalities of gait and mobility: Secondary | ICD-10-CM

## 2017-09-28 NOTE — Therapy (Signed)
[facility] [address], [place], [place], [address] Phone: [phone]   Fax:  [phone]  Physical Therapy Treatment  Patient Details  Name: [name] MRN: [id number] Date of Birth: 31-May-1955 Referring Provider: Dr. Paralee Cancel   Encounter Date: 09/28/2017  PT End of Session - 09/28/17 1011    Visit Number  4    Date for PT Re-Evaluation  10/14/17    Authorization Type  [facility] [employer] (prior auth required for secondary insurance); 24 visits total approved (9 used prior to 09/02/17 visit); will need to request additional visits with review from insurance if needed past 24 visits    Authorization - Visit Number  13    Authorization - Number of Visits  24    PT Start Time  0929    PT Stop Time  1010    PT Time Calculation (min)  41 min    Behavior During Therapy  [facility] for tasks assessed/performed       Past Medical History:  Diagnosis Date  . Arthritis   . 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 Heart murmur   . 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 MVP (mitral valve prolapse)    mild mvp w/ mild regurg. per last echo 04-24-2017  . Neuropathy due to chemotherapeutic drug ([place])    . Non-healing surgical wound    right knee post re-implantation total knee arthroplasty 04-2017 unable to completely close surgical incision  . PONV (postoperative nausea and vomiting)    ponv likes scopolamine patch  . Port-A-Cath in place    RIGHT CHEST   . Red blood cell antibody positive   . Skin changes related to chemotherapy    per patient she has scabbed over skin circumventing port to right upper chest ;  state it is due to chemptherapy   . 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 APPLICATION OF A-CELL OF BACK Right 07/31/2016   Procedure: CELLERATE COLLAGEN PLACEMENT;  Surgeon: Loel Lofty Dillingham, DO;  Location: [facility];  Service: Plastics;  Laterality: Right;  . APPLICATION OF WOUND VAC Right 06/29/2017   Procedure: APPLICATION OF WOUND VAC;  Surgeon: Wallace Going, DO;  Location: [facility];  Service: Plastics;  Laterality: Right;  . DEBRIDEMENT CHEST WALL RIGHT/ VAC PLACEMENT  02-09-2015    [facility]  . EXCISIONAL TOTAL KNEE ARTHROPLASTY WITH ANTIBIOTIC SPACERS Right 12/02/2016   Procedure: EXCISIONAL TOTAL KNEE ARTHROPLASTY WITH ANTIBIOTIC SPACERS;  Surgeon: Paralee Cancel, MD;  [facility]: [facility];  Service: Orthopedics;  Laterality: Right;  90 mins  . HEMATOMA EVACUATION Right 06/29/2017   Procedure: EVACUATION HEMATOMA OF RIGHT KNEE;  Surgeon: Wallace Going, DO;  [facility]: [facility];  Service: Plastics;  Laterality: Right;  . HEMATOMA EVACUATION Right 06/29/2017   Procedure: EVACUATION RIGHT TOTAL KNEE HEMATOMA;  Surgeon: Paralee Cancel, MD;  [facility];  Service: Orthopedics;  Laterality: Right;  . HEMATOMA EVACUATION Right 07/14/2017   Procedure: EVACUATION RIGHT LEG HEMATOMA WITH APPLICATION OF WOUND VAC;  Surgeon: Paralee Cancel, MD;  [facility];  Service: Orthopedics;  Laterality: Right;  . hemotoma      evacuation left chest wall  . I&D EXTREMITY Right 07/17/2017   Procedure: EVACUATION RIGHT LEG HEMATOMA WITH WOUND VAC DRESSING CHANGE;  Surgeon: Paralee Cancel, MD;  [facility];  Service: Orthopedics;  Laterality: Right;  . I&D KNEE WITH POLY EXCHANGE Right 05/28/2016   Procedure: IRRIGATION AND DEBRIDEMENT KNEE WOUND VAC PLACMENT;  Surgeon: Susa Day, MD;  [facility];  Service: Orthopedics;  Laterality: Right;  . I&D KNEE WITH POLY EXCHANGE Right 05/30/2016   Procedure: RADICAL SYNOVECTOMY,IRRIGATION AND DEBRIDEMENT KNEE WITH POLY EXCHANGE WITH ANTIBIOTIC BEADS, APPLICATION OF WOUND VAC;  Surgeon: Susa Day, MD;  [facility];  Service: Orthopedics;  Laterality: Right;  . INCISION AND DRAINAGE OF WOUND Right 07/31/2016   Procedure: IRRIGATION AND DEBRIDEMENT RIGHT KNEE  WOUND;  Surgeon: Loel Lofty Dillingham, DO;  Location: [place];  Service: Plastics;  Laterality: Right;  . IR FLUORO GUIDE CV LINE LEFT  04/30/2017  . IR US GUIDE VASC ACCESS LEFT  04/30/2017  . IRRIGATION AND DEBRIDEMENT KNEE Right 04/12/2016   Procedure: IRRIGATION AND DEBRIDEMENT KNEE;  Surgeon: Nicholes Stairs, MD;  [facility];  Service: Orthopedics;  Laterality: Right;  . IRRIGATION AND DEBRIDEMENT KNEE Right 12/16/2016   Procedure: Repeat irrigation and debridement right knee, wound closure  wound vac REPLACEMENT ANTIBIOTIC SPACERS;  Surgeon: Paralee Cancel, MD;  [facility];  Service: Orthopedics;  Laterality: Right;  . KNEE ARTHROSCOPY  02/13/2012   Procedure: ARTHROSCOPY KNEE;  Surgeon: Johnn Hai, MD;  [facility];  Service: Orthopedics;  Laterality: Left;  WITH DEBRIDEMENt   . KNEE ARTHROSCOPY WITH LATERAL MENISECTOMY  02/13/2012   Procedure: KNEE ARTHROSCOPY WITH LATERAL MENISECTOMY;  Surgeon: Johnn Hai, MD;  Location: [facility];  Service: Orthopedics;;  partial  . LATISSIMUS FLAP RIGHT CHEST  02-23-2015   [facility]  . LEFT MODIFIED RADICAL  MASTECTOMY/ RIGHT TOTAL MASTECTOMY  11-13-2007   LEFT BREAST CANCER W/ AXILLARY LYMPH NODE METASTASIS AND POST NEOADJUVANT CHEMO  . MASS EXCISION Right 06/29/2017   Procedure: EXCISION OF METASTATIC BREAST CANCER TO SKIN RIGHT SHOULDER, PLACEMENT OF CELLERATE;  Surgeon: Wallace Going, DO;  [facility];  Service: Plastics;  Laterality: Right;  . PLACEMENT PORT-A-CATH  06/22/2007    new port placed 2015, right chest  . REIMPLANTATION OF TOTAL KNEE Right 04/27/2017   Procedure: Reimplantation of right total knee arthroplasty;  Surgeon: Paralee Cancel, MD;  [facility];  Service: Orthopedics;  Laterality: Right;  Adductor Block  . RESECTION OF ISOLATED CHEST WALL DISEASE/ ABDOMINAL SKIN FLAP  02/ 2015     [facility]   RIGHT CHEST WALL METASTATIC SKIN CARCINOMA  . SKIN SPLIT GRAFT Right 01/29/2017   Procedure: SKIN GRAFT SPLIT THICKNESS TO RIGHT KNEE WOUND;  Surgeon: Wallace Going, DO;  [facility];  Service: Plastics;  Laterality: Right;  . SKIN SPLIT  GRAFT Right 09/07/2017   Procedure: SKIN GRAFT SPLIT THICKNESS TO RIGHT KNEE WOUND WITH PLACEMENT OF VAC DRESSING TO GRAFT SITE;  Surgeon: Wallace Going, DO;  Location: [facility];  Service: Plastics;  Laterality: Right;  . TOTAL KNEE ARTHROPLASTY Left 09/23/2012   Procedure: LEFT TOTAL KNEE ARTHROPLASTY;  Surgeon: Johnn Hai, MD;  [facility];  Service: Orthopedics;  Laterality: Left;  . TOTAL KNEE ARTHROPLASTY Right 03/20/2016   Procedure: RIGHT TOTAL KNEE ARTHROPLASTY;  Surgeon: Susa Day, MD;  [facility];  Service: Orthopedics;  Laterality: Right;  . TRANSTHORACIC ECHOCARDIOGRAM  04/24/2017   ef 01-02%, grade 1 diastolic dysfuction/  mild MR with mild prolapse anterior leaflet/ mild TR/ PASP 96mmHg    There were no vitals filed for this visit.  Subjective Assessment - 09/28/17 0924    Subjective  I drove myself today.  I only get pain on my graft site.      Currently in Pain?  No/denies                        [facility] Adult PT Treatment/Exercise - 09/28/17 0001      Knee/Hip Exercises: Aerobic   Recumbent Bike  Level 2x 10 minutes      Knee/Hip Exercises: Standing   Rebounder  weight shifting 3 ways- 1 minute each      Knee/Hip Exercises: Seated   Long Arc Quad  Both;20 reps    Long Arc Quad Weight  2 lbs.    Ball Squeeze  20 reps    Marching  Both;20 reps;Weights    Marching Weights  2 lbs.    Hamstring Curl  Strengthening      Knee/Hip Exercises: Supine   Quad Sets  Strengthening;Right;2 sets;5 sets    Straight Leg Raises  Strengthening;Right;2 sets;5 reps    Knee Flexion  AROM;Both;2 sets;10 reps red ball under legs      Knee/Hip Exercises: Sidelying   Clams  Rt only 2x10      Knee/Hip Exercises: Prone   Straight Leg Raises  Strengthening;Right;2 sets;10 reps                  PT Long Term Goals - 09/02/17 1024      PT LONG TERM GOAL #1   Title  independent with HEP    Status  New    Target Date  10/14/17      PT LONG TERM GOAL #2   Title  demonstrate improved functional strength by performing 5x STS without UE support in < 16 sec    Status  New    Target Date  10/14/17      PT LONG TERM GOAL #3   Title  improve functional endurance by performing 3MWT with at least 700'     Status  New    Target Date  10/14/17      PT LONG TERM GOAL #4   Title  FOTO score improved to </= 40% limitation for improved function    Status  New    Target Date  10/14/17      PT LONG TERM GOAL #5   Title  report pain < 4/10 with activity for improved mobility and function    Status  New    Target Date  10/14/17            Plan - 09/28/17 0940    Clinical Impression Statement  Pt continues to heal after skin  graft on Rt knee.  Pt has had intestinal side effects from oral chemo and has had low energy due to this.  Pt demonstrates wide base of support and Limited Rt LE A/ROM with gait.  Pt with chronic weakness due to multiple medical  complications.  Pt tolerated activity in the clinic today and demonstrated fatigue by the end of session.      Rehab Potential  Good    PT Frequency  2x / week    PT Duration  6 weeks    PT Treatment/Interventions  ADLs/Self Care Home Management;Cryotherapy;Moist Heat;Therapeutic exercise;Therapeutic activities;Functional mobility training;Stair training;Gait training;Balance training;Neuromuscular re-education;Patient/family education;Manual techniques;Vasopneumatic Device;Taping;Passive range of motion    PT Next Visit Plan  advance exercise as able, Rt knee strength, endurance and flexibility.  Note for MD visit on Wednesday    PT Home Exercise Plan  Access Code: [email or web address]     Recommended Other Services  initial certification is signed    Consulted and Agree with Plan of Care  Patient       Patient will benefit from skilled therapeutic intervention in order to improve the following deficits and impairments:  Abnormal gait, Pain, Decreased strength, Decreased activity tolerance, Decreased balance, Decreased mobility, Decreased range of motion  Visit Diagnosis: Acute pain of right knee  Stiffness of right knee, not elsewhere classified  Muscle weakness (generalized)  Other abnormalities of gait and mobility  Localized edema     Problem List Patient Active Problem List   Diagnosis Date Noted  . Wound, open with complication 18/29/9371  . Fever   . Anemia due to chemotherapy 05/07/2017  . Abnormal LFTs 05/07/2017  . History of breast cancer   . Bilateral leg edema   . Enterococcal infection   . S/P revision of total knee, right 04/27/2017  . Pyogenic arthritis of right knee joint ([place])   . Acute blood loss anemia 12/12/2016  . Right knee abx spacer 12/02/2016  . Physical exam 07/31/2016  . Allergic reaction 07/24/2016  . Drug rash 07/24/2016  . S/P total knee arthroplasty, right 05/31/2016  . 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 Nonhealing surgical wound  05/28/2016  . Sepsis ([place]) 05/10/2016  . UTI (urinary tract infection) 05/10/2016  . Cellulitis of right knee 05/10/2016  . Blood clot in vein   . Wound dehiscence, surgical 04/12/2016  . 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 Vitamin D deficiency 01/25/2016  . 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 Primary osteoarthritis of right knee 02/01/2015  . Symptomatic anemia 02/01/2015  . Frequent UTI 01/12/2014  . Sinus tachycardia 11/03/2013  . SOB (shortness of breath) 11/03/2013  . Postoperative anemia due to acute blood loss 12/24/2012  .    09/23/2012  . Sinusitis 07/28/2012  . Neuropathy due to chemotherapeutic drug ([place]) 07/25/2011  . 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 Metastatic breast cancer ([place]) 11/12/2010  . Chronic anticoagulation 11/12/2010  . Anxiety, generalized 03/26/2008    Sigurd Sos, PT 09/28/17 10:13 AM  [facility] [address] [place], [place], [address] Phone: [phone]   Fax:  [phone]  Name: [name] MRN: [id number] Date of Birth: Oct 02, 1955

## 2017-09-29 DIAGNOSIS — Z5111 Encounter for antineoplastic chemotherapy: Secondary | ICD-10-CM | POA: Diagnosis not present

## 2017-09-29 DIAGNOSIS — C50912 Malignant neoplasm of unspecified site of left female breast: Secondary | ICD-10-CM | POA: Diagnosis not present

## 2017-09-29 DIAGNOSIS — G629 Polyneuropathy, unspecified: Secondary | ICD-10-CM | POA: Diagnosis not present

## 2017-09-29 DIAGNOSIS — R531 Weakness: Secondary | ICD-10-CM | POA: Diagnosis not present

## 2017-09-29 DIAGNOSIS — R5383 Other fatigue: Secondary | ICD-10-CM | POA: Diagnosis not present

## 2017-09-29 DIAGNOSIS — R748 Abnormal levels of other serum enzymes: Secondary | ICD-10-CM | POA: Diagnosis not present

## 2017-09-29 DIAGNOSIS — Z5112 Encounter for antineoplastic immunotherapy: Secondary | ICD-10-CM | POA: Diagnosis not present

## 2017-09-29 DIAGNOSIS — C792 Secondary malignant neoplasm of skin: Secondary | ICD-10-CM | POA: Diagnosis not present

## 2017-09-29 DIAGNOSIS — R0602 Shortness of breath: Secondary | ICD-10-CM | POA: Diagnosis not present

## 2017-09-29 DIAGNOSIS — Z171 Estrogen receptor negative status [ER-]: Secondary | ICD-10-CM | POA: Diagnosis not present

## 2017-09-29 DIAGNOSIS — C50412 Malignant neoplasm of upper-outer quadrant of left female breast: Secondary | ICD-10-CM | POA: Diagnosis not present

## 2017-09-29 MED FILL — TYKERB 250 MG TABLET: 250 | 28 days supply | Qty: 42 | Fill #0

## 2017-09-29 MED FILL — PROCHLORPERAZINE 10 MG TAB: 10 | 8 days supply | Qty: 30 | Fill #0

## 2017-09-30 ENCOUNTER — Ambulatory Visit: Payer: 59

## 2017-09-30 DIAGNOSIS — M25561 Pain in right knee: Secondary | ICD-10-CM

## 2017-09-30 DIAGNOSIS — M25661 Stiffness of right knee, not elsewhere classified: Secondary | ICD-10-CM | POA: Diagnosis not present

## 2017-09-30 DIAGNOSIS — M6281 Muscle weakness (generalized): Secondary | ICD-10-CM | POA: Diagnosis not present

## 2017-09-30 DIAGNOSIS — R600 Localized edema: Secondary | ICD-10-CM

## 2017-09-30 DIAGNOSIS — R2689 Other abnormalities of gait and mobility: Secondary | ICD-10-CM | POA: Diagnosis not present

## 2017-09-30 NOTE — Therapy (Signed)
[facility] [address], [place], [place], [address] Phone: [phone]   Fax:  [phone]  Physical Therapy Treatment  Patient Details  Name: [name] MRN: [id number] Date of Birth: 1955/07/29 Referring Provider: Dr. Paralee Cancel   Encounter Date: 09/30/2017  PT End of Session - 09/30/17 1010    Visit Number  5    Number of Visits  12    Date for PT Re-Evaluation  10/14/17    Authorization Type  [facility] [employer] (prior auth required for secondary insurance); 24 visits total approved (9 used prior to 09/02/17 visit); will need to request additional visits with review from insurance if needed past 24 visits    Authorization - Visit Number  14    Authorization - Number of Visits  24    PT Start Time  0920    PT Stop Time  1010    PT Time Calculation (min)  50 min    Activity Tolerance  Patient tolerated treatment well    Behavior During Therapy  [facility] for tasks assessed/performed       Past Medical History:  Diagnosis Date  . Arthritis   . 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 Heart murmur   . 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 MVP (mitral valve prolapse)    mild mvp w/ mild  regurg. per last echo 04-24-2017  . Neuropathy due to chemotherapeutic drug ([place])   . Non-healing surgical wound    right knee post re-implantation total knee arthroplasty 04-2017 unable to completely close surgical incision  . PONV (postoperative nausea and vomiting)    ponv likes scopolamine patch  . Port-A-Cath in place    RIGHT CHEST   . Red blood cell antibody positive   . Skin changes related to chemotherapy    per patient she has scabbed over skin circumventing port to right upper chest ;  state it is due to chemptherapy   . 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 APPLICATION OF A-CELL OF BACK Right 07/31/2016   Procedure: CELLERATE COLLAGEN PLACEMENT;  Surgeon: Loel Lofty Dillingham, DO;  Location: [place];  Service: Plastics;  Laterality: Right;  . APPLICATION OF WOUND VAC Right 06/29/2017   Procedure: APPLICATION OF WOUND VAC;  Surgeon: Wallace Going, DO;  Location: [facility];  Service: Plastics;  Laterality: Right;  . DEBRIDEMENT CHEST WALL RIGHT/ VAC PLACEMENT  02-09-2015    [facility]  . EXCISIONAL TOTAL KNEE ARTHROPLASTY WITH ANTIBIOTIC SPACERS Right 12/02/2016   Procedure: EXCISIONAL TOTAL  KNEE ARTHROPLASTY WITH ANTIBIOTIC SPACERS;  Surgeon: Paralee Cancel, MD;  Location: [facility];  Service: Orthopedics;  Laterality: Right;  90 mins  . HEMATOMA EVACUATION Right 06/29/2017   Procedure: EVACUATION HEMATOMA OF RIGHT KNEE;  Surgeon: Wallace Going, DO;  Location: [facility];  Service: Plastics;  Laterality: Right;  . HEMATOMA EVACUATION Right 06/29/2017   Procedure: EVACUATION RIGHT TOTAL KNEE HEMATOMA;  Surgeon: Paralee Cancel, MD;  [facility];  Service: Orthopedics;  Laterality: Right;  . HEMATOMA EVACUATION Right 07/14/2017   Procedure: EVACUATION RIGHT LEG HEMATOMA WITH APPLICATION OF WOUND VAC;  Surgeon: Paralee Cancel, MD;  [facility];  Service: Orthopedics;  Laterality: Right;  . hemotoma     evacuation left chest wall  . I&D EXTREMITY Right 07/17/2017   Procedure: EVACUATION RIGHT LEG HEMATOMA WITH WOUND VAC DRESSING CHANGE;  Surgeon: Paralee Cancel, MD;  [facility];  Service: Orthopedics;  Laterality: Right;  . I&D KNEE WITH POLY EXCHANGE Right 05/28/2016   Procedure: IRRIGATION AND DEBRIDEMENT KNEE WOUND VAC PLACMENT;  Surgeon: Susa Day, MD;  [facility]: [facility];  Service: Orthopedics;  Laterality: Right;  . I&D KNEE WITH POLY EXCHANGE Right 05/30/2016   Procedure: RADICAL SYNOVECTOMY,IRRIGATION AND DEBRIDEMENT KNEE WITH POLY EXCHANGE WITH ANTIBIOTIC BEADS, APPLICATION OF WOUND VAC;  Surgeon: Susa Day, MD;  [facility];  Service: Orthopedics;  Laterality: Right;  . INCISION AND DRAINAGE OF WOUND Right 07/31/2016   Procedure: IRRIGATION AND DEBRIDEMENT RIGHT KNEE  WOUND;  Surgeon: Loel Lofty Dillingham, DO;  Location: [place];  Service: Plastics;  Laterality: Right;  . IR FLUORO GUIDE CV LINE LEFT  04/30/2017  . IR US GUIDE VASC ACCESS LEFT  04/30/2017  . IRRIGATION AND DEBRIDEMENT KNEE Right 04/12/2016   Procedure: IRRIGATION AND DEBRIDEMENT KNEE;  Surgeon: Nicholes Stairs, MD;  Location[facility];  Service: Orthopedics;  Laterality: Right;  . IRRIGATION AND DEBRIDEMENT KNEE Right 12/16/2016   Procedure: Repeat irrigation and debridement right knee, wound closure  wound vac REPLACEMENT ANTIBIOTIC SPACERS;  Surgeon: Paralee Cancel, MD;  [facility]: [facility];  Service: Orthopedics;  Laterality: Right;  . KNEE ARTHROSCOPY  02/13/2012   Procedure: ARTHROSCOPY KNEE;  Surgeon: Johnn Hai, MD;  Location[facility];  Service: Orthopedics;  Laterality: Left;  WITH DEBRIDEMENt   . KNEE ARTHROSCOPY WITH LATERAL MENISECTOMY  02/13/2012   Procedure: KNEE ARTHROSCOPY WITH LATERAL MENISECTOMY;  Surgeon: Johnn Hai, MD;  Location: [facility];  Service: Orthopedics;;   partial  . LATISSIMUS FLAP RIGHT CHEST  02-23-2015   [facility]  . LEFT MODIFIED RADICAL MASTECTOMY/ RIGHT TOTAL MASTECTOMY  11-13-2007   LEFT BREAST CANCER W/ AXILLARY LYMPH NODE METASTASIS AND POST NEOADJUVANT CHEMO  . MASS EXCISION Right 06/29/2017   Procedure: EXCISION OF METASTATIC BREAST CANCER TO SKIN RIGHT SHOULDER, PLACEMENT OF CELLERATE;  Surgeon: Wallace Going, DO;  Location[facility];  Service: Plastics;  Laterality: Right;  . PLACEMENT PORT-A-CATH  06/22/2007    new port placed 2015, right chest  . REIMPLANTATION OF TOTAL KNEE Right 04/27/2017   Procedure: Reimplantation of right total knee arthroplasty;  Surgeon: Paralee Cancel, MD;  Location[facility];  Service: Orthopedics;  Laterality: Right;  Adductor Block  . RESECTION OF ISOLATED CHEST WALL DISEASE/ ABDOMINAL SKIN FLAP  02/ 2015     [facility]   RIGHT CHEST WALL METASTATIC SKIN CARCINOMA  . SKIN SPLIT GRAFT Right 01/29/2017   Procedure: SKIN GRAFT SPLIT THICKNESS TO RIGHT KNEE WOUND;  Surgeon:  Dillingham, Loel Lofty, DO;  Location: [facility];  Service: Plastics;  Laterality: Right;  . SKIN SPLIT GRAFT Right 09/07/2017   Procedure: SKIN GRAFT SPLIT THICKNESS TO RIGHT KNEE WOUND WITH PLACEMENT OF VAC DRESSING TO GRAFT SITE;  Surgeon: Wallace Going, DO;  Location: [place];  Service: Plastics;  Laterality: Right;  . TOTAL KNEE ARTHROPLASTY Left 09/23/2012   Procedure: LEFT TOTAL KNEE ARTHROPLASTY;  Surgeon: Johnn Hai, MD;  Location: [facility];  Service: Orthopedics;  Laterality: Left;  . TOTAL KNEE ARTHROPLASTY Right 03/20/2016   Procedure: RIGHT TOTAL KNEE ARTHROPLASTY;  Surgeon: Susa Day, MD;  Location: [facility];  Service: Orthopedics;  Laterality: Right;  . TRANSTHORACIC ECHOCARDIOGRAM  04/24/2017   ef 69-48%, grade 1 diastolic dysfuction/  mild MR with mild prolapse anterior leaflet/ mild TR/ PASP 36mmHg    There were no vitals filed for this visit.  Subjective Assessment - 09/30/17 0929    Subjective  Doing  well.  I am walking better.      Currently in Pain?  No/denies         [facility] PT Assessment - 09/30/17 0001      Assessment   Medical Diagnosis  Rt TKA reconstruction s/p I&D with wound vac placement x 2    Onset Date/Surgical Date  04/27/17      AROM   Right Knee Extension  0    Right Knee Flexion  105      Strength   Right/Left Hip  Right;Left    Right Hip Flexion  4-/5    Right Knee Flexion  4+/5    Right Knee Extension  4-/5 with quad lag                   [facility] Adult PT Treatment/Exercise - 09/30/17 0001      Knee/Hip Exercises: Aerobic   Nustep  L3 x 10 min      Knee/Hip Exercises: Standing   Rebounder  weight shifting 3 ways- 1 minute each      Knee/Hip Exercises: Seated   Long Arc Quad  Both;20 reps    Long Arc Quad Weight  2 lbs.    Ball Squeeze  20 reps    Marching  Both;20 reps;Weights    Marching Weights  2 lbs.    Hamstring Curl  Strengthening      Knee/Hip Exercises: Supine   Quad Sets  Strengthening;Right;2 sets;5 sets    Straight Leg Raises  Strengthening;Right;2 sets;5 reps    Knee Flexion  AROM;Both;2 sets;10 reps red ball under legs      Knee/Hip Exercises: Sidelying   Clams  Rt only 2x10                  PT Long Term Goals - 09/02/17 1024      PT LONG TERM GOAL #1   Title  independent with HEP    Status  New    Target Date  10/14/17      PT LONG TERM GOAL #2   Title  demonstrate improved functional strength by performing 5x STS without UE support in < 16 sec    Status  New    Target Date  10/14/17      PT LONG TERM GOAL #3   Title  improve functional endurance by performing 3MWT with at least 700'     Status  New    Target Date  10/14/17      PT LONG TERM GOAL #  4   Title  FOTO score improved to </= 40% limitation for improved function    Status  New    Target Date  10/14/17      PT LONG TERM GOAL #5   Title  report pain < 4/10 with activity for improved mobility and function    Status  New    Target  Date  10/14/17            Plan - 09/30/17 0944    Clinical Impression Statement  Pt continues to heal after skin graft on Rt knee.  Pt had intestinal side effects from oral chemo and has had low energy due to this.  Pt continues to be challenged by low level exercise due to chronic weakness related to multiple medical complications and is working daily to increase her activity level.  Pt ambulates with wide base of support and limited Rt LE A/ROM with gait.  Pt will continue to benefit from skilled PT for advancement of Rt LE strength and flexibility.      Rehab Potential  Good    PT Frequency  2x / week    PT Duration  6 weeks    PT Treatment/Interventions  ADLs/Self Care Home Management;Cryotherapy;Moist Heat;Therapeutic exercise;Therapeutic activities;Functional mobility training;Stair training;Gait training;Balance training;Neuromuscular re-education;Patient/family education;Manual techniques;Vasopneumatic Device;Taping;Passive range of motion    PT Next Visit Plan  advance exercise as able, Rt knee strength, endurance and flexibility.  Note for MD visit on Wednesday    PT Home Exercise Plan  Access Code: [email or web address]     Consulted and Agree with Plan of Care  Patient       Patient will benefit from skilled therapeutic intervention in order to improve the following deficits and impairments:  Abnormal gait, Pain, Decreased strength, Decreased activity tolerance, Decreased balance, Decreased mobility, Decreased range of motion  Visit Diagnosis: Acute pain of right knee  Stiffness of right knee, not elsewhere classified  Muscle weakness (generalized)  Other abnormalities of gait and mobility  Localized edema     Problem List Patient Active Problem List   Diagnosis Date Noted  . Wound, open with complication 76/19/5093  . Fever   . Anemia due to chemotherapy 05/07/2017  . Abnormal LFTs 05/07/2017  . History of breast cancer   . Bilateral leg edema   . Enterococcal infection    . S/P revision of total knee, right 04/27/2017  . Pyogenic arthritis of right knee joint ([place])   . Acute blood loss anemia 12/12/2016  . Right knee abx spacer 12/02/2016  . Physical exam 07/31/2016  . Allergic reaction 07/24/2016  . Drug rash 07/24/2016  . S/P total knee arthroplasty, right 05/31/2016  . 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 Nonhealing surgical wound 05/28/2016  . Sepsis ([place]) 05/10/2016  . UTI (urinary tract infection) 05/10/2016  . Cellulitis of right knee 05/10/2016  . Blood clot in vein   . Wound dehiscence, surgical 04/12/2016  . 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 Vitamin D deficiency 01/25/2016  . 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 Primary osteoarthritis of right knee 02/01/2015  . Symptomatic anemia 02/01/2015  . Frequent UTI 01/12/2014  . Sinus tachycardia 11/03/2013  . SOB (shortness of breath) 11/03/2013  . Postoperative anemia due to acute blood loss 12/24/2012  .    09/23/2012  . Sinusitis 07/28/2012  . Neuropathy due to chemotherapeutic drug ([place]) 07/25/2011  . 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 Metastatic breast cancer ([place]) 11/12/2010  . Chronic anticoagulation 11/12/2010  .  Anxiety, generalized 03/26/2008    Sigurd Sos, PT 09/30/17 10:13 AM  [facility] [address] [place], [place], [address] Phone: [phone]   Fax:  [phone]  Name: [name] MRN: [id number] Date of Birth: 1956/03/14

## 2017-10-01 ENCOUNTER — Encounter: Payer: Self-pay | Admitting: Physical Therapy

## 2017-10-01 MED FILL — ELIQUIS 5 MG TABLET: 5 | 30 days supply | Qty: 60 | Fill #0

## 2017-10-05 ENCOUNTER — Ambulatory Visit: Payer: 59

## 2017-10-05 DIAGNOSIS — M6281 Muscle weakness (generalized): Secondary | ICD-10-CM

## 2017-10-05 DIAGNOSIS — R600 Localized edema: Secondary | ICD-10-CM | POA: Diagnosis not present

## 2017-10-05 DIAGNOSIS — R2689 Other abnormalities of gait and mobility: Secondary | ICD-10-CM | POA: Diagnosis not present

## 2017-10-05 DIAGNOSIS — M25661 Stiffness of right knee, not elsewhere classified: Secondary | ICD-10-CM | POA: Diagnosis not present

## 2017-10-05 DIAGNOSIS — M25561 Pain in right knee: Secondary | ICD-10-CM | POA: Diagnosis not present

## 2017-10-05 MED FILL — DARIFENACIN ER 15 MG TABLET: 15 | 30 days supply | Qty: 30 | Fill #9

## 2017-10-05 NOTE — Therapy (Signed)
[facility] [address], [place], [place], [address] Phone: [phone]   Fax:  [phone]  Physical Therapy Treatment  Patient Details  Name: [name] MRN: [id number] Date of Birth: [birth year] Referring Provider: Dr. Paralee Cancel   Encounter Date: 10/05/2017  PT End of Session - 10/05/17 1257    Visit Number  6    Date for PT Re-Evaluation  10/14/17    Authorization Type  [facility] [employer] (prior auth required for secondary insurance); 24 visits total approved (9 used prior to 09/02/17 visit); will need to request additional visits with review from insurance if needed past 24 visits    Authorization - Visit Number  15    Authorization - Number of Visits  24    PT Start Time  1220    PT Stop Time  1259    PT Time Calculation (min)  39 min    Activity Tolerance  Patient tolerated treatment well    Behavior During Therapy  [facility] for tasks assessed/performed       Past Medical History:  Diagnosis Date  . Arthritis   . 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 Heart murmur   . 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 MVP (mitral valve prolapse)    mild mvp w/ mild regurg. per last echo  04-24-2017  . Neuropathy due to chemotherapeutic drug ([place])   . Non-healing surgical wound    right knee post re-implantation total knee arthroplasty 04-2017 unable to completely close surgical incision  . PONV (postoperative nausea and vomiting)    ponv likes scopolamine patch  . Port-A-Cath in place    RIGHT CHEST   . Red blood cell antibody positive   . Skin changes related to chemotherapy    per patient she has scabbed over skin circumventing port to right upper chest ;  state it is due to chemptherapy   . 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 APPLICATION OF A-CELL OF BACK Right 07/31/2016   Procedure: CELLERATE COLLAGEN PLACEMENT;  Surgeon: Loel Lofty Dillingham, DO;  Location: [place];  Service: Plastics;  Laterality: Right;  . APPLICATION OF WOUND VAC Right 06/29/2017   Procedure: APPLICATION OF WOUND VAC;  Surgeon: Wallace Going, DO;  [facility];  Service: Plastics;  Laterality: Right;  . DEBRIDEMENT CHEST WALL RIGHT/ VAC PLACEMENT  02-09-2015    [facility]  . EXCISIONAL TOTAL KNEE ARTHROPLASTY WITH ANTIBIOTIC SPACERS Right 12/02/2016   Procedure: EXCISIONAL TOTAL KNEE ARTHROPLASTY WITH ANTIBIOTIC SPACERS;  Surgeon: Alvan Dame,  Rodman Key, MD;  [facility];  Service: Orthopedics;  Laterality: Right;  90 mins  . HEMATOMA EVACUATION Right 06/29/2017   Procedure: EVACUATION HEMATOMA OF RIGHT KNEE;  Surgeon: Wallace Going, DO;  [facility];  Service: Plastics;  Laterality: Right;  . HEMATOMA EVACUATION Right 06/29/2017   Procedure: EVACUATION RIGHT TOTAL KNEE HEMATOMA;  Surgeon: Paralee Cancel, MD;  [facility];  Service: Orthopedics;  Laterality: Right;  . HEMATOMA EVACUATION Right 07/14/2017   Procedure: EVACUATION RIGHT LEG HEMATOMA WITH APPLICATION OF WOUND VAC;  Surgeon: Paralee Cancel, MD;  [facility];  Service: Orthopedics;  Laterality: Right;  . hemotoma     evacuation left chest wall  . I&D EXTREMITY Right 07/17/2017   Procedure: EVACUATION RIGHT LEG HEMATOMA WITH WOUND VAC DRESSING CHANGE;  Surgeon: Paralee Cancel, MD;  [facility];  Service: Orthopedics;  Laterality: Right;  . I&D KNEE WITH POLY EXCHANGE Right 05/28/2016   Procedure: IRRIGATION AND DEBRIDEMENT KNEE WOUND VAC PLACMENT;  Surgeon: Susa Day, MD;  [facility];  Service: Orthopedics;  Laterality: Right;  . I&D KNEE WITH POLY EXCHANGE Right 05/30/2016   Procedure: RADICAL SYNOVECTOMY,IRRIGATION AND DEBRIDEMENT KNEE WITH POLY EXCHANGE WITH ANTIBIOTIC BEADS, APPLICATION OF WOUND VAC;  Surgeon: Susa Day, MD;  [facility];  Service: Orthopedics;  Laterality: Right;  . INCISION AND DRAINAGE OF WOUND Right 07/31/2016   Procedure: IRRIGATION AND DEBRIDEMENT RIGHT KNEE  WOUND;  Surgeon: Loel Lofty Dillingham, DO;  Location: [place];  Service: Plastics;  Laterality: Right;  . IR FLUORO GUIDE CV LINE LEFT  04/30/2017  . IR US GUIDE VASC ACCESS LEFT  04/30/2017  . IRRIGATION AND DEBRIDEMENT KNEE Right 04/12/2016   Procedure: IRRIGATION AND DEBRIDEMENT KNEE;  Surgeon: Nicholes Stairs, MD;  [facility];  Service: Orthopedics;  Laterality: Right;  . IRRIGATION AND DEBRIDEMENT KNEE Right 12/16/2016   Procedure: Repeat irrigation and debridement right knee, wound closure  wound vac REPLACEMENT ANTIBIOTIC SPACERS;  Surgeon: Paralee Cancel, MD;  [facility];  Service: Orthopedics;  Laterality: Right;  . KNEE ARTHROSCOPY  02/13/2012   Procedure: ARTHROSCOPY KNEE;  Surgeon: Johnn Hai, MD;  Location: [facility];  Service: Orthopedics;  Laterality: Left;  WITH DEBRIDEMENt   . KNEE ARTHROSCOPY WITH LATERAL MENISECTOMY  02/13/2012   Procedure: KNEE ARTHROSCOPY WITH LATERAL MENISECTOMY;  Surgeon: Johnn Hai, MD;  Location: [facility];  Service: Orthopedics;;  partial  . LATISSIMUS  FLAP RIGHT CHEST  02-23-2015   [facility]  . LEFT MODIFIED RADICAL MASTECTOMY/ RIGHT TOTAL MASTECTOMY  11-13-2007   LEFT BREAST CANCER W/ AXILLARY LYMPH NODE METASTASIS AND POST NEOADJUVANT CHEMO  . MASS EXCISION Right 06/29/2017   Procedure: EXCISION OF METASTATIC BREAST CANCER TO SKIN RIGHT SHOULDER, PLACEMENT OF CELLERATE;  Surgeon: Wallace Going, DO;  [facility];  Service: Plastics;  Laterality: Right;  . PLACEMENT PORT-A-CATH  06/22/2007    new port placed 2015, right chest  . REIMPLANTATION OF TOTAL KNEE Right 04/27/2017   Procedure: Reimplantation of right total knee arthroplasty;  Surgeon: Paralee Cancel, MD;  [facility];  Service: Orthopedics;  Laterality: Right;  Adductor Block  . RESECTION OF ISOLATED CHEST WALL DISEASE/ ABDOMINAL SKIN FLAP  02/ 2015     [facility]   RIGHT CHEST WALL METASTATIC SKIN CARCINOMA  . SKIN SPLIT GRAFT Right 01/29/2017   Procedure: SKIN GRAFT SPLIT THICKNESS TO RIGHT KNEE WOUND;  Surgeon: Wallace Going, DO;  [facility];  Service: Clinical cytogeneticist;  Laterality: Right;  . SKIN SPLIT GRAFT Right 09/07/2017   Procedure: SKIN GRAFT SPLIT THICKNESS TO RIGHT KNEE WOUND WITH PLACEMENT OF VAC DRESSING TO GRAFT SITE;  Surgeon: Wallace Going, DO;  Location: [place];  Service: Plastics;  Laterality: Right;  . TOTAL KNEE ARTHROPLASTY Left 09/23/2012   Procedure: LEFT TOTAL KNEE ARTHROPLASTY;  Surgeon: Johnn Hai, MD;  [facility];  Service: Orthopedics;  Laterality: Left;  . TOTAL KNEE ARTHROPLASTY Right 03/20/2016   Procedure: RIGHT TOTAL KNEE ARTHROPLASTY;  Surgeon: Susa Day, MD;  [facility];  Service: Orthopedics;  Laterality: Right;  . TRANSTHORACIC ECHOCARDIOGRAM  04/24/2017   ef 10-27%, grade 1 diastolic dysfuction/  mild MR with mild prolapse anterior leaflet/ mild TR/ PASP 79mmHg    There were no vitals filed for this visit.  Subjective Assessment - 10/05/17 1225    Subjective  Working on [employer].   I start back on my oral chemo today.  Pt saw Dr Alvan Dame and he told her to come back in 3 months, continue with PT and follow-up with MD for graft.      Patient Stated Goals  improve standing and endurance    Currently in Pain?  No/denies         [facility] PT Assessment - 10/05/17 0001      Assessment   Medical Diagnosis  Rt TKA reconstruction s/p I&D with wound vac placement x 2      AROM   Right Knee Extension  0    Right Knee Flexion  105                   [facility] Adult PT Treatment/Exercise - 10/05/17 0001      Knee/Hip Exercises: Aerobic   Nustep  L3 x 10 min      Knee/Hip Exercises: Standing   Heel Raises  20 reps;Both    Hip Abduction  Both;20 reps;Knee straight    Hip Extension  Both;20 reps;Knee straight    Rebounder  weight shifting 3 ways- 1 minute each      Knee/Hip Exercises: Seated   Long Arc Quad  Both;20 reps    Long Arc Quad Weight  2 lbs.    Ball Squeeze  20 reps    Marching  Both;20 reps;Weights    Marching Weights  2 lbs.    Hamstring Curl  Strengthening;20 reps red theraband      Knee/Hip Exercises: Supine   Quad Sets  --                  PT Long Term Goals - 09/02/17 1024      PT LONG TERM GOAL #1   Title  independent with HEP    Status  New    Target Date  10/14/17      PT LONG TERM GOAL #2   Title  demonstrate improved functional strength by performing 5x STS without UE support in < 16 sec    Status  New    Target Date  10/14/17      PT LONG TERM GOAL #3   Title  improve functional endurance by performing 3MWT with at least 700'     Status  New    Target Date  10/14/17      PT LONG TERM GOAL #4   Title  FOTO score improved to </= 40% limitation for improved function    Status  New    Target Date  10/14/17      PT LONG TERM GOAL #5   Title  report pain < 4/10 with activity for improved mobility and function    Status  New    Target Date  10/14/17            Plan - 10/05/17 1230    Clinical Impression  Statement  Pt is continuing to heal after skin graft to Rt knee. Pt is building endurance and is able to do standing exercise today with rest breaks taken.  Pt with low endurance due to multiple medical conditions and set-back with illness associated with chemo.  Pt ambulates with wide base of support without device.  Pt will continue to benefit from skilled PT for advancement of Rt LE srength and flexibility.      Rehab Potential  Good    PT Frequency  2x / week    PT Duration  6 weeks    PT Treatment/Interventions  ADLs/Self Care Home Management;Cryotherapy;Moist Heat;Therapeutic exercise;Therapeutic activities;Functional mobility training;Stair training;Gait training;Balance training;Neuromuscular re-education;Patient/family education;Manual techniques;Vasopneumatic Device;Taping;Passive range of motion    PT Next Visit Plan  advance exercise as able, Rt knee strength, endurance and flexibility.        PT Home Exercise Plan  Access Code: [email or web address]     Consulted and Agree with Plan of Care  Patient       Patient will benefit from skilled therapeutic intervention in order to improve the following deficits and impairments:  Abnormal gait, Pain, Decreased strength, Decreased activity tolerance, Decreased balance, Decreased mobility, Decreased range of motion  Visit Diagnosis: Acute pain of right knee  Stiffness of right knee, not elsewhere classified  Muscle weakness (generalized)  Other abnormalities of gait and mobility     Problem List Patient Active Problem List   Diagnosis Date Noted  . Wound, open with complication 55/73/2202  . Fever   . Anemia due to chemotherapy 05/07/2017  . Abnormal LFTs 05/07/2017  . History of breast cancer   . Bilateral leg edema   . Enterococcal infection   . S/P revision of total knee, right 04/27/2017  . Pyogenic arthritis of right knee joint ([place])   . Acute blood loss anemia 12/12/2016  . Right knee abx spacer 12/02/2016  . Physical exam  07/31/2016  . Allergic reaction 07/24/2016  . Drug rash 07/24/2016  . S/P total knee arthroplasty, right 05/31/2016  . 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 Nonhealing surgical wound 05/28/2016  . Sepsis ([place]) 05/10/2016  . UTI (urinary tract infection) 05/10/2016  . Cellulitis of right knee 05/10/2016  . Blood clot in vein   . Wound dehiscence, surgical 04/12/2016  . 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 Vitamin D deficiency 01/25/2016  . 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 Primary osteoarthritis of right knee 02/01/2015  . Symptomatic anemia 02/01/2015  . Frequent UTI 01/12/2014  . Sinus tachycardia 11/03/2013  . SOB (shortness of breath) 11/03/2013  . Postoperative anemia due to acute blood loss 12/24/2012  .    09/23/2012  . Sinusitis 07/28/2012  . Neuropathy due to chemotherapeutic drug ([place]) 07/25/2011  . 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 Metastatic breast cancer ([place]) 11/12/2010  . Chronic anticoagulation 11/12/2010  . Anxiety, generalized 03/26/2008    Sigurd Sos, PT 10/05/17 1:03 PM  [facility] [address], [place], [place], [address] Phone: [phone]   Fax:  [phone]  Name: [name] MRN: [id number] Date of Birth: [birth year]

## 2017-10-07 ENCOUNTER — Ambulatory Visit: Payer: 59

## 2017-10-07 DIAGNOSIS — M25561 Pain in right knee: Secondary | ICD-10-CM | POA: Diagnosis not present

## 2017-10-07 DIAGNOSIS — R600 Localized edema: Secondary | ICD-10-CM | POA: Diagnosis not present

## 2017-10-07 DIAGNOSIS — M6281 Muscle weakness (generalized): Secondary | ICD-10-CM

## 2017-10-07 DIAGNOSIS — R2689 Other abnormalities of gait and mobility: Secondary | ICD-10-CM

## 2017-10-07 DIAGNOSIS — M25661 Stiffness of right knee, not elsewhere classified: Secondary | ICD-10-CM

## 2017-10-07 NOTE — Therapy (Signed)
[facility] [address] [place], [place], [address] Phone: [phone]   Fax:  [phone]  Physical Therapy Treatment  Patient Details  Name: [name] MRN: [id number] Date of Birth: 09-12-1955 Referring Provider: Dr. Paralee Cancel   Encounter Date: 10/07/2017  PT End of Session - 10/07/17 1003    Visit Number  7    Date for PT Re-Evaluation  10/14/17    Authorization Type  [facility] UMR/U[employer] (prior auth required for secondary insurance); 24 visits total approved (9 used prior to 09/02/17 visit); will need to request additional visits with review from insurance if needed past 24 visits    Authorization - Visit Number  16    Authorization - Number of Visits  24    PT Start Time  0920    PT Stop Time  1003    PT Time Calculation (min)  43 min    Activity Tolerance  Patient tolerated treatment well    Behavior During Therapy  [facility] for tasks assessed/performed       Past Medical History:  Diagnosis Date  . Arthritis   . 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 Heart murmur   . 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 MVP (mitral valve prolapse)    mild mvp w/ mild regurg. per last echo  04-24-2017  . Neuropathy due to chemotherapeutic drug ([place])   . Non-healing surgical wound    right knee post re-implantation total knee arthroplasty 04-2017 unable to completely close surgical incision  . PONV (postoperative nausea and vomiting)    ponv likes scopolamine patch  . Port-A-Cath in place    RIGHT CHEST   . Red blood cell antibody positive   . Skin changes related to chemotherapy    per patient she has scabbed over skin circumventing port to right upper chest ;  state it is due to chemptherapy   . 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 APPLICATION OF A-CELL OF BACK Right 07/31/2016   Procedure: CELLERATE COLLAGEN PLACEMENT;  Surgeon: Loel Lofty Dillingham, DO;  Location: [facility];  Service: Plastics;  Laterality: Right;  . APPLICATION OF WOUND VAC Right 06/29/2017   Procedure: APPLICATION OF WOUND VAC;  Surgeon: Wallace Going, DO;  [facility];  Service: Plastics;  Laterality: Right;  . DEBRIDEMENT CHEST WALL RIGHT/ VAC PLACEMENT  02-09-2015    [facility]  . EXCISIONAL TOTAL KNEE ARTHROPLASTY WITH ANTIBIOTIC SPACERS Right 12/02/2016   Procedure: EXCISIONAL TOTAL KNEE ARTHROPLASTY WITH ANTIBIOTIC SPACERS;  Surgeon: Alvan Dame,  Rodman Key, MD;  [facility];  Service: Orthopedics;  Laterality: Right;  90 mins  . HEMATOMA EVACUATION Right 06/29/2017   Procedure: EVACUATION HEMATOMA OF RIGHT KNEE;  Surgeon: Wallace Going, DO;  [facility];  Service: Plastics;  Laterality: Right;  . HEMATOMA EVACUATION Right 06/29/2017   Procedure: EVACUATION RIGHT TOTAL KNEE HEMATOMA;  Surgeon: Paralee Cancel, MD;  [facility];  Service: Orthopedics;  Laterality: Right;  . HEMATOMA EVACUATION Right 07/14/2017   Procedure: EVACUATION RIGHT LEG HEMATOMA WITH APPLICATION OF WOUND VAC;  Surgeon: Paralee Cancel, MD;  [facility];  Service: Orthopedics;  Laterality: Right;  . hemotoma     evacuation left chest wall  . I&D EXTREMITY Right 07/17/2017   Procedure: EVACUATION RIGHT LEG HEMATOMA WITH WOUND VAC DRESSING CHANGE;  Surgeon: Paralee Cancel, MD;  [facility];  Service: Orthopedics;  Laterality: Right;  . I&D KNEE WITH POLY EXCHANGE Right 05/28/2016   Procedure: IRRIGATION AND DEBRIDEMENT KNEE WOUND VAC PLACMENT;  Surgeon: Susa Day, MD;  [facility];  Service: Orthopedics;  Laterality: Right;  . I&D KNEE WITH POLY EXCHANGE Right 05/30/2016   Procedure: RADICAL SYNOVECTOMY,IRRIGATION AND DEBRIDEMENT KNEE WITH POLY EXCHANGE WITH ANTIBIOTIC BEADS, APPLICATION OF WOUND VAC;  Surgeon: Susa Day, MD;  [facility];  Service: Orthopedics;  Laterality: Right;  . INCISION AND DRAINAGE OF WOUND Right 07/31/2016   Procedure: IRRIGATION AND DEBRIDEMENT RIGHT KNEE  WOUND;  Surgeon: Loel Lofty Dillingham, DO;  Location: [place];  Service: Plastics;  Laterality: Right;  . IR FLUORO GUIDE CV LINE LEFT  04/30/2017  . IR US GUIDE VASC ACCESS LEFT  04/30/2017  . IRRIGATION AND DEBRIDEMENT KNEE Right 04/12/2016   Procedure: IRRIGATION AND DEBRIDEMENT KNEE;  Surgeon: Nicholes Stairs, MD;  [facility];  Service: Orthopedics;  Laterality: Right;  . IRRIGATION AND DEBRIDEMENT KNEE Right 12/16/2016   Procedure: Repeat irrigation and debridement right knee, wound closure  wound vac REPLACEMENT ANTIBIOTIC SPACERS;  Surgeon: Paralee Cancel, MD;  [facility];  Service: Orthopedics;  Laterality: Right;  . KNEE ARTHROSCOPY  02/13/2012   Procedure: ARTHROSCOPY KNEE;  Surgeon: Johnn Hai, MD;  Location: [facility];  Service: Orthopedics;  Laterality: Left;  WITH DEBRIDEMENt   . KNEE ARTHROSCOPY WITH LATERAL MENISECTOMY  02/13/2012   Procedure: KNEE ARTHROSCOPY WITH LATERAL MENISECTOMY;  Surgeon: Johnn Hai, MD;  Location: [facility];  Service: Orthopedics;;  partial  . LATISSIMUS  FLAP RIGHT CHEST  02-23-2015   [facility]  . LEFT MODIFIED RADICAL MASTECTOMY/ RIGHT TOTAL MASTECTOMY  11-13-2007   LEFT BREAST CANCER W/ AXILLARY LYMPH NODE METASTASIS AND POST NEOADJUVANT CHEMO  . MASS EXCISION Right 06/29/2017   Procedure: EXCISION OF METASTATIC BREAST CANCER TO SKIN RIGHT SHOULDER, PLACEMENT OF CELLERATE;  Surgeon: Wallace Going, DO;  [facility];  Service: Plastics;  Laterality: Right;  . PLACEMENT PORT-A-CATH  06/22/2007    new port placed 2015, right chest  . REIMPLANTATION OF TOTAL KNEE Right 04/27/2017   Procedure: Reimplantation of right total knee arthroplasty;  Surgeon: Paralee Cancel, MD;  [facility];  Service: Orthopedics;  Laterality: Right;  Adductor Block  . RESECTION OF ISOLATED CHEST WALL DISEASE/ ABDOMINAL SKIN FLAP  02/ 2015     [facility]   RIGHT CHEST WALL METASTATIC SKIN CARCINOMA  . SKIN SPLIT GRAFT Right 01/29/2017   Procedure: SKIN GRAFT SPLIT THICKNESS TO RIGHT KNEE WOUND;  Surgeon: Wallace Going, DO;  [facility];  Service: Clinical cytogeneticist;  Laterality: Right;  . SKIN SPLIT GRAFT Right 09/07/2017   Procedure: SKIN GRAFT SPLIT THICKNESS TO RIGHT KNEE WOUND WITH PLACEMENT OF VAC DRESSING TO GRAFT SITE;  Surgeon: Wallace Going, DO;  Location: [facility];  Service: Plastics;  Laterality: Right;  . TOTAL KNEE ARTHROPLASTY Left 09/23/2012   Procedure: LEFT TOTAL KNEE ARTHROPLASTY;  Surgeon: Johnn Hai, MD;  [facility];  Service: Orthopedics;  Laterality: Left;  . TOTAL KNEE ARTHROPLASTY Right 03/20/2016   Procedure: RIGHT TOTAL KNEE ARTHROPLASTY;  Surgeon: Susa Day, MD;  [facility];  Service: Orthopedics;  Laterality: Right;  . TRANSTHORACIC ECHOCARDIOGRAM  04/24/2017   ef 38-75%, grade 1 diastolic dysfuction/  mild MR with mild prolapse anterior leaflet/ mild TR/ PASP 73mmHg    There were no vitals filed for this visit.  Subjective Assessment - 10/07/17 0937    Subjective  I was sore in my hips from  exercise.  It was a good sore.  I started taking the oral chemo on Monday.  So far, no side effects.     Currently in Pain?  No/denies         [facility] PT Assessment - 10/07/17 0001      Transfers   Five time sit to stand comments   19.2 seconds without UE support                   [facility] Adult PT Treatment/Exercise - 10/07/17 0001      Knee/Hip Exercises: Aerobic   Nustep  L3 x 10 min      Knee/Hip Exercises: Standing   Heel Raises  20 reps;Both    Hip Flexion  Stengthening;Both;20 reps;Knee bent    Hip Abduction  Both;20 reps;Knee straight    Hip Extension  Both;20 reps;Knee straight    Rocker Board  3 minutes    Rebounder  weight shifting 3 ways- 1 minute each      Knee/Hip Exercises: Seated   Long Arc Quad  Both;20 reps    Long Arc Quad Weight  2 lbs.    Ball Squeeze  20 reps    Hamstring Curl  Strengthening;20 reps red theraband                  PT Long Term Goals - 10/07/17 6433      PT LONG TERM GOAL #1   Title  independent with HEP    Time  8    Period  Weeks    Status  On-going      PT LONG TERM GOAL #2   Title  demonstrate improved functional strength by performing 5x STS without UE support in < 16 sec    Baseline  19.2 seconds- uncontrolled descent    Time  8    Period  Weeks    Status  On-going      PT LONG TERM GOAL #5   Title  report pain < 4/10 with activity for improved mobility and function    Baseline  no knee pain reported    Status  Achieved            Plan - 10/07/17 0941    Clinical Impression Statement  Pt is continuing to heal after skin graft of Rt knee.  Pt has been able to advance to standing exercise/strength today.  Pt able to perform 5x sit to stand without use of hands and demonstrated uncontrolled descent after rep #3.  time for this  test was 19.2.  Pt ambulates with wide base of support without device. Pt requires stand by assistance due to instability with standing exercises.  Pt will continue to benefit  from skilled PT for LE strength, endurance and gait due to chronic weakness and copmlicated recovery from TKA.      Rehab Potential  Good    PT Frequency  2x / week    PT Duration  6 weeks    PT Treatment/Interventions  ADLs/Self Care Home Management;Cryotherapy;Moist Heat;Therapeutic exercise;Therapeutic activities;Functional mobility training;Stair training;Gait training;Balance training;Neuromuscular re-education;Patient/family education;Manual techniques;Vasopneumatic Device;Taping;Passive range of motion    PT Next Visit Plan  advance exercise as able, Rt knee strength, endurance and flexibility.    3 minute walk test.    PT Home Exercise Plan  Access Code: [email or web address]     Consulted and Agree with Plan of Care  Patient       Patient will benefit from skilled therapeutic intervention in order to improve the following deficits and impairments:  Abnormal gait, Pain, Decreased strength, Decreased activity tolerance, Decreased balance, Decreased mobility, Decreased range of motion  Visit Diagnosis: Acute pain of right knee  Stiffness of right knee, not elsewhere classified  Muscle weakness (generalized)  Other abnormalities of gait and mobility  Localized edema     Problem List Patient Active Problem List   Diagnosis Date Noted  . Wound, open with complication 90/24/0973  . Fever   . Anemia due to chemotherapy 05/07/2017  . Abnormal LFTs 05/07/2017  . History of breast cancer   . Bilateral leg edema   . Enterococcal infection   . S/P revision of total knee, right 04/27/2017  . Pyogenic arthritis of right knee joint ([place])   . Acute blood loss anemia 12/12/2016  . Right knee abx spacer 12/02/2016  . Physical exam 07/31/2016  . Allergic reaction 07/24/2016  . Drug rash 07/24/2016  . S/P total knee arthroplasty, right 05/31/2016  . 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 Nonhealing surgical wound 05/28/2016  . Sepsis ([place]) 05/10/2016  . UTI (urinary tract infection)  05/10/2016  . Cellulitis of right knee 05/10/2016  . Blood clot in vein   . Wound dehiscence, surgical 04/12/2016  . 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 Vitamin D deficiency 01/25/2016  . Metastatic cancer to chest wall ([place]) 02/02/2015  . Primary osteoarthritis of right knee 02/01/2015  . Symptomatic anemia 02/01/2015  . Frequent UTI 01/12/2014  . Sinus tachycardia 11/03/2013  . SOB (shortness of breath) 11/03/2013  . Postoperative anemia due to acute blood loss 12/24/2012  .    09/23/2012  . Sinusitis 07/28/2012  . Neuropathy due to chemotherapeutic drug ([place]) 07/25/2011  . 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 Metastatic breast cancer ([place]) 11/12/2010  . Chronic anticoagulation 11/12/2010  . Anxiety, generalized 03/26/2008    [name] 10/07/2017, 10:05 AM  [facility] [address], [place], [place], [address] Phone: [phone]   Fax:  [phone]  Name: [name] MRN: [id number] Date of Birth: September 09, 1955

## 2017-10-12 ENCOUNTER — Ambulatory Visit (HOSPITAL_BASED_OUTPATIENT_CLINIC_OR_DEPARTMENT_OTHER)
Admission: RE | Admit: 2017-10-12 | Discharge: 2017-10-12 | Disposition: A | Discharge status: Home / Self Care | Payer: 59 | Source: Ambulatory Visit | Attending: Internal Medicine | Admitting: Internal Medicine

## 2017-10-12 ENCOUNTER — Ambulatory Visit (HOSPITAL_COMMUNITY)
Admission: RE | Admit: 2017-10-12 | Discharge: 2017-10-12 | Disposition: A | Discharge status: Home / Self Care | Payer: 59 | Source: Ambulatory Visit | Attending: Internal Medicine | Admitting: Internal Medicine

## 2017-10-12 ENCOUNTER — Other Ambulatory Visit (HOSPITAL_COMMUNITY): Payer: Self-pay | Admitting: *Deleted

## 2017-10-12 ENCOUNTER — Other Ambulatory Visit: Payer: Self-pay

## 2017-10-12 VITALS — BP 142/88 | HR 112 | Wt 163.9 lb

## 2017-10-12 DIAGNOSIS — Z9221 Personal history of antineoplastic chemotherapy: Secondary | ICD-10-CM | POA: Diagnosis not present

## 2017-10-12 DIAGNOSIS — Z880 Allergy status to penicillin: Secondary | ICD-10-CM | POA: Insufficient documentation

## 2017-10-12 DIAGNOSIS — K766 Portal hypertension: Secondary | ICD-10-CM | POA: Diagnosis not present

## 2017-10-12 DIAGNOSIS — Z9013 Acquired absence of bilateral breasts and nipples: Secondary | ICD-10-CM | POA: Diagnosis not present

## 2017-10-12 DIAGNOSIS — R0600 Dyspnea, unspecified: Secondary | ICD-10-CM

## 2017-10-12 DIAGNOSIS — C7989 Secondary malignant neoplasm of other specified sites: Secondary | ICD-10-CM

## 2017-10-12 DIAGNOSIS — C792 Secondary malignant neoplasm of skin: Secondary | ICD-10-CM | POA: Diagnosis not present

## 2017-10-12 DIAGNOSIS — Z7901 Long term (current) use of anticoagulants: Secondary | ICD-10-CM | POA: Diagnosis not present

## 2017-10-12 DIAGNOSIS — Z853 Personal history of malignant neoplasm of breast: Secondary | ICD-10-CM | POA: Diagnosis not present

## 2017-10-12 DIAGNOSIS — I081 Rheumatic disorders of both mitral and tricuspid valves: Secondary | ICD-10-CM | POA: Insufficient documentation

## 2017-10-12 DIAGNOSIS — C50912 Malignant neoplasm of unspecified site of left female breast: Secondary | ICD-10-CM

## 2017-10-12 DIAGNOSIS — Z820 Family history of epilepsy and other diseases of the nervous system: Secondary | ICD-10-CM | POA: Diagnosis not present

## 2017-10-12 DIAGNOSIS — Z811 Family history of alcohol abuse and dependence: Secondary | ICD-10-CM | POA: Insufficient documentation

## 2017-10-12 DIAGNOSIS — Z79899 Other long term (current) drug therapy: Secondary | ICD-10-CM | POA: Insufficient documentation

## 2017-10-12 DIAGNOSIS — Z923 Personal history of irradiation: Secondary | ICD-10-CM | POA: Insufficient documentation

## 2017-10-12 DIAGNOSIS — Z86718 Personal history of other venous thrombosis and embolism: Secondary | ICD-10-CM | POA: Insufficient documentation

## 2017-10-12 DIAGNOSIS — M1990 Unspecified osteoarthritis, unspecified site: Secondary | ICD-10-CM | POA: Diagnosis not present

## 2017-10-12 DIAGNOSIS — I2721 Secondary pulmonary arterial hypertension: Secondary | ICD-10-CM | POA: Insufficient documentation

## 2017-10-12 DIAGNOSIS — Z8249 Family history of ischemic heart disease and other diseases of the circulatory system: Secondary | ICD-10-CM | POA: Insufficient documentation

## 2017-10-12 NOTE — Progress Notes (Signed)
Patient ID: [name], female   DOB: March 23, 1956, 62 y.o.   MRN: [id number]     Cardio-Oncology Clinic Note  PCP: Dr. 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   She is s/p bilateral mastectomies, XRT, chemo. She is currently on Kadcyla (trastuzamab-emantsine) every 3 weeks. Underwent skin flap for skim metastasis on her abdomen in 2/15  Had skin resection with chest skin flap on 02/21/15 at [facility]. Requiring wound vac. Path ok fortunately with no recurrent malignancy.  Had clots around port-a-cath so on Elqiuis.   She returns for follow up. Has had a very difficult year. Had R TKA which got infected and then had to have hardware removed and got a spacer. Hardware eventually replaced. Then developed a hematoma and wound. Chemo had to be stopped for 2 months and required a skin graft. Unfortunately when chemo stopped developed recurrent skin mets which she is now struggling with. Remains on lapatanib (taking every other day due to diarrhea) and herceptin. Had CT chest in 12/18 due to SOB. No PE but showed evidence of possible PAH. Remains weak. SOB with exertion. No CP. Had severe LE swelling but has now resolved.      Echo 10/12/17 EF 60-65% GLS -17.5% RV mildly dilated with normal function RVSP 65 IVC ok Personally reviewed   ECHO 09/27/2013 EF 60-65% Lateral S'10.7 Grade 2 DD ECHO 02/27/2014 EF 60-65% Lateral S' 10.4  ECHO 07/14/2014  EF 60-65% Lateral S' 10.4 GLS -23.1% mild MR ECHO 04/18/2015  EF 60-65% Lateral S' 13.6 GLS -19.2% mild TR Trivial MR. RV ok ECHO 03/03/2016 EF 60-65% lateral S 11.8 GLS -19.2 poor window   Past Medical History:  Diagnosis Date  . Arthritis   . 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 Heart murmur   . 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 MVP (mitral valve prolapse)    mild mvp w/ mild regurg. per last echo 04-24-2017  . Neuropathy due to chemotherapeutic drug ([place])   . Non-healing surgical wound    right knee post re-implantation total knee arthroplasty 04-2017 unable to completely close surgical incision  . PONV (postoperative nausea and vomiting)    ponv likes scopolamine patch  . Port-A-Cath in place    RIGHT CHEST   . Red blood cell antibody positive   . Skin changes related to chemotherapy    per patient she has scabbed over skin circumventing port to right upper chest ;  state it is due to chemptherapy   . 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 acetaminophen (TYLENOL) 500 MG tablet Take 500 mg by mouth every 8 (eight) hours as needed for mild pain or headache.    [name] apixaban (ELIQUIS) 5 MG TABS tablet Take 5 mg by mouth 2 (two) times daily.    . cetirizine (ZYRTEC) 10 MG tablet Take 1 tablet (10 mg total) daily by mouth. (Patient taking differently: Take 10 mg by mouth every morning. ) 30 tablet 11  . Cholecalciferol (VITAMIN D3) 2000 UNITS TABS Take 2,000 Units by mouth 4 (four) times a week.     . darifenacin (ENABLEX) 15 MG 24 hr  tablet Take 15 mg by mouth every morning.   10  . diphenoxylate-atropine (LOMOTIL) 2.5-0.025 MG tablet diphenoxylate-atropine 2.5 mg-0.025 mg tablet  TAKE 1 TABLET BY MOUTH 4 (FOUR) TIMES DAILY AS NEEDED FOR DIARRHEA    . docusate sodium (COLACE) 100 MG capsule Take 1 capsule (100 mg total) by mouth 2 (two) times daily. 10 capsule 0  . doxycycline (VIBRA-TABS) 100 MG tablet Take 100 mg by mouth 2 (two) times daily. 6 month course started 05-2017    . gabapentin (NEURONTIN) 300 MG capsule TAKE 1 CAPSULE BY MOUTH 3 TIMES DAILY 90 capsule 6  . lapatinib (TYKERB) 250 MG tablet Take 750 mg by mouth every other day. Take on an empty stomach, at least 1 hour before or 1 hour after meals.    . Multiple Vitamin (MULTIVITAMIN WITH MINERALS) TABS tablet Take 1 tablet by mouth daily.    . ondansetron (ZOFRAN) 8 MG tablet Take 1 tablet (8 mg total) by mouth every 8 (eight) hours as needed for nausea. (Patient taking differently: Take 8 mg by mouth every 8 (eight) hours as needed for nausea or vomiting. Uses after chemo treatments) 20 tablet 6  . polyethylene glycol (MIRALAX / GLYCOLAX) packet Take 17 g by mouth 2 (two) times daily. 14 each 0  . prochlorperazine (COMPAZINE) 10 MG tablet TAKE 1 TABLET (10 MG TOTAL) BY MOUTH EVERY 6 (SIX) HOURS AS NEEDED FOR NAUSEA  3  . Trastuzumab (HERCEPTIN IV) Inject into the vein.    [name] zolpidem (AMBIEN) 5 MG tablet TAKE 1 TABLET BY MOUTH ONCE DAILY AT BEDTIME AS NEEDED FOR SLEEP 10 tablet 0   No current facility-administered medications for this encounter.     Allergies  Allergen Reactions  . Penicillins Hives, Itching and Rash    Has patient had a PCN reaction causing immediate rash, facial/tongue/throat swelling, SOB or lightheadedness with hypotension: yes Has patient had a PCN reaction causing severe rash involving mucus membranes or skin necrosis: No Has patient had a PCN reaction that required hospitalization No Has patient had a PCN reaction occurring within the  last 10 years: yes If all of the above answers are "NO", then may proceed with Cephalosporin use.   Denies airway involvement   . Other Rash    STERI STRIPS - Blisters  . Tape Hives    Can tolerate paper tape    Social History   Socioeconomic History  . Marital status: Married    Spouse name: Not on file  . Number of children: 3  . Years of education: Not on file  . Highest education level: Not on file  Occupational History  . 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 Financial resource strain: Not on file  . Food insecurity:    Worry: Not on file  Inability: Not on file  . Transportation needs:    Medical: Not on file    Non-medical: Not on file  Tobacco Use  . Smoking status: Never Smoker  . Smokeless tobacco: Never Used  Substance and Sexual Activity  . Alcohol use: No  . Drug use: No  . Sexual activity: Yes  Lifestyle  . Physical activity:    Days per week: Not on file    Minutes per session: Not on file  . Stress: Not on file  Relationships  . Social connections:    Talks on phone: Not on file    Gets together: Not on file    Attends religious service: Not on file    Active member of club or organization: Not on file    Attends meetings of clubs or organizations: Not on file    Relationship status: Not on file  . Intimate partner violence:    Fear of current or ex partner: Not on file    Emotionally abused: Not on file    Physically abused: Not on file    Forced sexual activity: Not on file  Other Topics Concern  . Not on file  Social History Narrative   Caffeine use: none   Regular exercise:  No   Married- lives with 1 year old daughter and her husband   Works as an Therapist, sports at the [employer] at [employer].          Family History  Problem Relation Age of Onset  . Heart disease Mother        due to mitral valve regurgiation,   . Cancer Mother        breast  . Allergic rhinitis Mother   . Parkinsonism Father   . Allergic rhinitis Father     . Migraines Father   . Alcohol abuse Paternal Grandfather   . Angioedema Neg Hx   . Asthma Neg Hx   . Eczema Neg Hx     PHYSICAL EXAM: Vitals:   10/12/17 1151  BP: (!) 142/88  Pulse: (!) 112  SpO2: 94%  Weight: 163 lb 14.4 oz (74.3 kg)   General:  Weak appearing. No respiratory difficulty HEENT: normal Neck: supple. no JVD. Carotids 2+ bilat; no bruits. No lymphadenopathy or thryomegaly appreciated. Cor: PMI nondisplaced. Regular rate & rhythm. 2/6 TR . + right shoulder skin mets  Lungs: clear no wheeze Abdomen: soft, nontender, nondistended. No hepatosplenomegaly. No bruits or masses. Good bowel sounds. Extremities: no cyanosis, clubbing, rash, RLE healed wound with skin graft. Trace edemea Neuro: alert & orientedx3, cranial nerves grossly intact. moves all 4 extremities w/o difficulty. Affect pleasant    ECG: Sinus tach with RAD  No ST-T wave abnormalities. Personally reviewed   ASSESSMENT & PLAN: 1. Breast CA, stage IV with skin mets - HER 2-neu+ , ER/PR -, BRCA - 2. S/p RTKA with prosthetic infection - resolved 3. PAH  Echo reviewed personally. No evidence of chemo-induced cardiotoxicity with lapatanib or herceptin. However she has developed evidence of PAH of unclear cause. CT scan from 12/18 reviewed personally and shows evidence of PAH (dialted PA) but no PE. Will do VQ scan looking for chronic clot. Check hall walk to exclude exertional hypoxemia. Snores only mildly. May need to consider overnight oximetry.  OK to continue lapatanib and Herceptin.  Daniel Bensimhon,MD 12:21 PM

## 2017-10-12 NOTE — Progress Notes (Signed)
  Echocardiogram 2D Echocardiogram has been performed.  Johny Chess 10/12/2017, 12:02 PM

## 2017-10-12 NOTE — Progress Notes (Signed)
Ambulated pt around clinic O2 sats dropped from 96% to 91% on RA but did not go below 91%.

## 2017-10-12 NOTE — Patient Instructions (Addendum)
Chest X-ray and VQ Scan on Friday 10/16/17 at 11 am, please arrive at [facility] Radiology Department at [facility] recommends that you schedule a follow-up appointment in: 3 months

## 2017-10-13 ENCOUNTER — Ambulatory Visit: Payer: 59

## 2017-10-13 DIAGNOSIS — C792 Secondary malignant neoplasm of skin: Secondary | ICD-10-CM | POA: Diagnosis not present

## 2017-10-13 DIAGNOSIS — M25561 Pain in right knee: Secondary | ICD-10-CM | POA: Diagnosis not present

## 2017-10-13 DIAGNOSIS — M25661 Stiffness of right knee, not elsewhere classified: Secondary | ICD-10-CM | POA: Diagnosis not present

## 2017-10-13 DIAGNOSIS — R600 Localized edema: Secondary | ICD-10-CM

## 2017-10-13 DIAGNOSIS — J3801 Paralysis of vocal cords and larynx, unilateral: Secondary | ICD-10-CM | POA: Diagnosis not present

## 2017-10-13 DIAGNOSIS — C50911 Malignant neoplasm of unspecified site of right female breast: Secondary | ICD-10-CM | POA: Diagnosis not present

## 2017-10-13 DIAGNOSIS — R2689 Other abnormalities of gait and mobility: Secondary | ICD-10-CM | POA: Diagnosis not present

## 2017-10-13 DIAGNOSIS — M6281 Muscle weakness (generalized): Secondary | ICD-10-CM | POA: Diagnosis not present

## 2017-10-13 NOTE — Therapy (Signed)
[facility] [address], [place], [place], [address] Phone: [phone]   Fax:  [phone]  Physical Therapy Treatment  Patient Details  Name: [name] MRN: [id number] Date of Birth: Mar 12, 1956 Referring Provider: Dr. Paralee Cancel   Encounter Date: 10/13/2017  PT End of Session - 10/13/17 1057    Visit Number  8    Number of Visits  12    Date for PT Re-Evaluation  10/14/17    Authorization Type  [facility] [employer] (prior auth required for secondary insurance); 24 visits total approved (9 used prior to 09/02/17 visit); will need to request additional visits with review from insurance if needed past 24 visits    Authorization - Visit Number  43    Authorization - Number of Visits  24    PT Start Time  1010    PT Stop Time  1052    PT Time Calculation (min)  42 min    Activity Tolerance  Patient tolerated treatment well    Behavior During Therapy  [facility] for tasks assessed/performed       Past Medical History:  Diagnosis Date  . Arthritis   . 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 Heart murmur   . 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 MVP (mitral valve prolapse)    mild mvp w/ mild  regurg. per last echo 04-24-2017  . Neuropathy due to chemotherapeutic drug ([place])   . Non-healing surgical wound    right knee post re-implantation total knee arthroplasty 04-2017 unable to completely close surgical incision  . PONV (postoperative nausea and vomiting)    ponv likes scopolamine patch  . Port-A-Cath in place    RIGHT CHEST   . Red blood cell antibody positive   . Skin changes related to chemotherapy    per patient she has scabbed over skin circumventing port to right upper chest ;  state it is due to chemptherapy   . 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 APPLICATION OF A-CELL OF BACK Right 07/31/2016   Procedure: CELLERATE COLLAGEN PLACEMENT;  Surgeon: Loel Lofty Dillingham, DO;  Location: [facility];  Service: Plastics;  Laterality: Right;  . APPLICATION OF WOUND VAC Right 06/29/2017   Procedure: APPLICATION OF WOUND VAC;  Surgeon: Wallace Going, DO;  Location: [facility];  Service: Plastics;  Laterality: Right;  . DEBRIDEMENT CHEST WALL RIGHT/ VAC PLACEMENT  02-09-2015    [facility]  . EXCISIONAL TOTAL KNEE ARTHROPLASTY WITH ANTIBIOTIC SPACERS Right 12/02/2016   Procedure: EXCISIONAL TOTAL  KNEE ARTHROPLASTY WITH ANTIBIOTIC SPACERS;  Surgeon: Paralee Cancel, MD;  Location: [facility];  Service: Orthopedics;  Laterality: Right;  90 mins  . HEMATOMA EVACUATION Right 06/29/2017   Procedure: EVACUATION HEMATOMA OF RIGHT KNEE;  Surgeon: Wallace Going, DO;  Location: [facility];  Service: Plastics;  Laterality: Right;  . HEMATOMA EVACUATION Right 06/29/2017   Procedure: EVACUATION RIGHT TOTAL KNEE HEMATOMA;  Surgeon: Paralee Cancel, MD;  [facility]: [facility];  Service: Orthopedics;  Laterality: Right;  . HEMATOMA EVACUATION Right 07/14/2017   Procedure: EVACUATION RIGHT LEG HEMATOMA WITH APPLICATION OF WOUND VAC;  Surgeon: Paralee Cancel, MD;  [facility]: [facility];  Service: Orthopedics;  Laterality: Right;  . hemotoma     evacuation left chest wall  . I&D EXTREMITY Right 07/17/2017   Procedure: EVACUATION RIGHT LEG HEMATOMA WITH WOUND VAC DRESSING CHANGE;  Surgeon: Paralee Cancel, MD;  [facility];  Service: Orthopedics;  Laterality: Right;  . I&D KNEE WITH POLY EXCHANGE Right 05/28/2016   Procedure: IRRIGATION AND DEBRIDEMENT KNEE WOUND VAC PLACMENT;  Surgeon: Susa Day, MD;  [facility]: [facility];  Service: Orthopedics;  Laterality: Right;  . I&D KNEE WITH POLY EXCHANGE Right 05/30/2016   Procedure: RADICAL SYNOVECTOMY,IRRIGATION AND DEBRIDEMENT KNEE WITH POLY EXCHANGE WITH ANTIBIOTIC BEADS, APPLICATION OF WOUND VAC;  Surgeon: Susa Day, MD;  [facility];  Service: Orthopedics;  Laterality: Right;  . INCISION AND DRAINAGE OF WOUND Right 07/31/2016   Procedure: IRRIGATION AND DEBRIDEMENT RIGHT KNEE  WOUND;  Surgeon: Loel Lofty Dillingham, DO;  Location: [facility];  Service: Plastics;  Laterality: Right;  . IR FLUORO GUIDE CV LINE LEFT  04/30/2017  . IR US GUIDE VASC ACCESS LEFT  04/30/2017  . IRRIGATION AND DEBRIDEMENT KNEE Right 04/12/2016   Procedure: IRRIGATION AND DEBRIDEMENT KNEE;  Surgeon: Nicholes Stairs, MD;  [facility];  Service: Orthopedics;  Laterality: Right;  . IRRIGATION AND DEBRIDEMENT KNEE Right 12/16/2016   Procedure: Repeat irrigation and debridement right knee, wound closure  wound vac REPLACEMENT ANTIBIOTIC SPACERS;  Surgeon: Paralee Cancel, MD;  [facility];  Service: Orthopedics;  Laterality: Right;  . KNEE ARTHROSCOPY  02/13/2012   Procedure: ARTHROSCOPY KNEE;  Surgeon: Johnn Hai, MD;  Location[facility];  Service: Orthopedics;  Laterality: Left;  WITH DEBRIDEMENt   . KNEE ARTHROSCOPY WITH LATERAL MENISECTOMY  02/13/2012   Procedure: KNEE ARTHROSCOPY WITH LATERAL MENISECTOMY;  Surgeon: Johnn Hai, MD;  Location: [facility];  Service: Orthopedics;;   partial  . LATISSIMUS FLAP RIGHT CHEST  02-23-2015   [facility]  . LEFT MODIFIED RADICAL MASTECTOMY/ RIGHT TOTAL MASTECTOMY  11-13-2007   LEFT BREAST CANCER W/ AXILLARY LYMPH NODE METASTASIS AND POST NEOADJUVANT CHEMO  . MASS EXCISION Right 06/29/2017   Procedure: EXCISION OF METASTATIC BREAST CANCER TO SKIN RIGHT SHOULDER, PLACEMENT OF CELLERATE;  Surgeon: Wallace Going, DO;  [facility];  Service: Plastics;  Laterality: Right;  . PLACEMENT PORT-A-CATH  06/22/2007    new port placed 2015, right chest  . REIMPLANTATION OF TOTAL KNEE Right 04/27/2017   Procedure: Reimplantation of right total knee arthroplasty;  Surgeon: Paralee Cancel, MD;  [facility];  Service: Orthopedics;  Laterality: Right;  Adductor Block  . RESECTION OF ISOLATED CHEST WALL DISEASE/ ABDOMINAL SKIN FLAP  02/ 2015     [facility]   RIGHT CHEST WALL METASTATIC SKIN CARCINOMA  . SKIN SPLIT GRAFT Right 01/29/2017   Procedure: SKIN GRAFT SPLIT THICKNESS TO RIGHT KNEE WOUND;  Surgeon:  Dillingham, Loel Lofty, DO;  Location: [facility];  Service: Plastics;  Laterality: Right;  . SKIN SPLIT GRAFT Right 09/07/2017   Procedure: SKIN GRAFT SPLIT THICKNESS TO RIGHT KNEE WOUND WITH PLACEMENT OF VAC DRESSING TO GRAFT SITE;  Surgeon: Wallace Going, DO;  Location: [facility];  Service: Plastics;  Laterality: Right;  . TOTAL KNEE ARTHROPLASTY Left 09/23/2012   Procedure: LEFT TOTAL KNEE ARTHROPLASTY;  Surgeon: Johnn Hai, MD;  [facility]: [facility];  Service: Orthopedics;  Laterality: Left;  . TOTAL KNEE ARTHROPLASTY Right 03/20/2016   Procedure: RIGHT TOTAL KNEE ARTHROPLASTY;  Surgeon: Susa Day, MD;  Location[facility];  Service: Orthopedics;  Laterality: Right;  . TRANSTHORACIC ECHOCARDIOGRAM  04/24/2017   ef 93-23%, grade 1 diastolic dysfuction/  mild MR with mild prolapse anterior leaflet/ mild TR/ PASP 38mmHg    There were no vitals filed for this visit.  Subjective Assessment - 10/13/17 1021    Subjective  I had  an echo yesterday and it was abnormal.  I will get a lung scan this week.  I feel short of breath at times.      Currently in Pain?  No/denies it feels stiff    Pain Location  Knee    Pain Orientation  Right                       [facility] Adult PT Treatment/Exercise - 10/13/17 0001      Knee/Hip Exercises: Aerobic   Nustep  L3 x 10 min      Knee/Hip Exercises: Standing   Hip Extension  Both;20 reps;Knee straight    Rocker Board  3 minutes    Rebounder  weight shifting 3 ways- 1 minute each      Knee/Hip Exercises: Seated   Long Arc Quad  Both;20 reps    Long Arc Quad Weight  3 lbs.    Ball Squeeze  20 reps    Marching  Both;20 reps;Weights    Marching Weights  3 lbs.    Hamstring Curl  Strengthening;20 reps red theraband                  PT Long Term Goals - 10/07/17 0937      PT LONG TERM GOAL #1   Title  independent with HEP    Time  8    Period  Weeks    Status  On-going      PT LONG TERM GOAL #2   Title  demonstrate improved functional strength by performing 5x STS without UE support in < 16 sec    Baseline  19.2 seconds- uncontrolled descent    Time  8    Period  Weeks    Status  On-going      PT LONG TERM GOAL #5   Title  report pain < 4/10 with activity for improved mobility and function    Baseline  no knee pain reported    Status  Achieved            Plan - 10/13/17 1027    Clinical Impression Statement  Pt reports shortness of breath at times.  She had echocardiogram yesterday and they will do a lung scan to determine shortness of breath.  Pt tolerated increased leg weights today.  Pt ambulates with a wide base of support without device.  Pt requires stand by assisntance due to instability with standing exercises.  Pt will will conitnue to benefit from skilled PT  for LE strength, endurance and gait due to chronic weakness and complicated recovery s/p total knee replacement.      Rehab Potential  Good    PT Frequency  2x / week     PT Duration  6 weeks    PT Treatment/Interventions  ADLs/Self Care Home Management;Cryotherapy;Moist Heat;Therapeutic exercise;Therapeutic activities;Functional mobility training;Stair training;Gait training;Balance training;Neuromuscular re-education;Patient/family education;Manual techniques;Vasopneumatic Device;Taping;Passive range of motion    PT Next Visit Plan  advance exercise as able, Rt knee strength, endurance and flexibility.    3 minute walk test next.    PT Home Exercise Plan  Access Code: [email or web address]     Consulted and Agree with Plan of Care  Patient       Patient will benefit from skilled therapeutic intervention in order to improve the following deficits and impairments:  Abnormal gait, Pain, Decreased strength, Decreased activity tolerance, Decreased balance, Decreased mobility, Decreased range of motion  Visit Diagnosis: Acute pain of right knee  Stiffness of right knee, not elsewhere classified  Muscle weakness (generalized)  Other abnormalities of gait and mobility  Localized edema     Problem List Patient Active Problem List   Diagnosis Date Noted  . Wound, open with complication 80/99/8338  . Fever   . Anemia due to chemotherapy 05/07/2017  . Abnormal LFTs 05/07/2017  . History of breast cancer   . Bilateral leg edema   . Enterococcal infection   . S/P revision of total knee, right 04/27/2017  . Pyogenic arthritis of right knee joint ([place])   . Acute blood loss anemia 12/12/2016  . Right knee abx spacer 12/02/2016  . Physical exam 07/31/2016  . Allergic reaction 07/24/2016  . Drug rash 07/24/2016  . S/P total knee arthroplasty, right 05/31/2016  . 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 Nonhealing surgical wound 05/28/2016  . Sepsis ([place]) 05/10/2016  . UTI (urinary tract infection) 05/10/2016  . Cellulitis of right knee 05/10/2016  . Blood clot in vein   . Wound dehiscence, surgical 04/12/2016  . 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 Vitamin D  deficiency 01/25/2016  . 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 Primary osteoarthritis of right knee 02/01/2015  . Symptomatic anemia 02/01/2015  . Frequent UTI 01/12/2014  . Sinus tachycardia 11/03/2013  . SOB (shortness of breath) 11/03/2013  . Postoperative anemia due to acute blood loss 12/24/2012  .    09/23/2012  . Sinusitis 07/28/2012  . Neuropathy due to chemotherapeutic drug ([place]) 07/25/2011  . 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 Metastatic breast cancer ([place]) 11/12/2010  . Chronic anticoagulation 11/12/2010  . Anxiety, generalized 03/26/2008     Sigurd Sos, PT 10/13/17 11:00 AM  [facility] [address] [place], [place], [address] Phone: [phone]   Fax:  [phone]  Name: [name] MRN: [id number] Date of Birth: [birth year]

## 2017-10-15 ENCOUNTER — Ambulatory Visit: Payer: 59

## 2017-10-15 DIAGNOSIS — M25561 Pain in right knee: Secondary | ICD-10-CM

## 2017-10-15 DIAGNOSIS — M25661 Stiffness of right knee, not elsewhere classified: Secondary | ICD-10-CM

## 2017-10-15 DIAGNOSIS — M6281 Muscle weakness (generalized): Secondary | ICD-10-CM

## 2017-10-15 DIAGNOSIS — R2689 Other abnormalities of gait and mobility: Secondary | ICD-10-CM | POA: Diagnosis not present

## 2017-10-15 DIAGNOSIS — R600 Localized edema: Secondary | ICD-10-CM

## 2017-10-15 NOTE — Therapy (Signed)
[facility] [address] [place], [place], [address] Phone: [phone]   Fax:  [phone]  Physical Therapy Treatment  Patient Details  Name: [name] MRN: [id number] Date of Birth: 1956/05/15 Referring Provider: Dr. Paralee Cancel   Encounter Date: 10/15/2017  PT End of Session - 10/15/17 1047    Visit Number  9    Date for PT Re-Evaluation  10/14/17    Authorization Type  [facility] [employer] (prior auth required for secondary insurance); 24 visits total approved (9 used prior to 09/02/17 visit); will need to request additional visits with review from insurance if needed past 24 visits    Authorization - Visit Number  18    Authorization - Number of Visits  24    PT Start Time  1011    PT Stop Time  1049    PT Time Calculation (min)  38 min    Activity Tolerance  Patient tolerated treatment well    Behavior During Therapy  [facility] for tasks assessed/performed       Past Medical History:  Diagnosis Date  . Arthritis   . 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 Heart murmur   . 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 MVP (mitral valve prolapse)    mild mvp w/ mild regurg. per last echo  04-24-2017  . Neuropathy due to chemotherapeutic drug ([place])   . Non-healing surgical wound    right knee post re-implantation total knee arthroplasty 04-2017 unable to completely close surgical incision  . PONV (postoperative nausea and vomiting)    ponv likes scopolamine patch  . Port-A-Cath in place    RIGHT CHEST   . Red blood cell antibody positive   . Skin changes related to chemotherapy    per patient she has scabbed over skin circumventing port to right upper chest ;  state it is due to chemptherapy   . 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 APPLICATION OF A-CELL OF BACK Right 07/31/2016   Procedure: CELLERATE COLLAGEN PLACEMENT;  Surgeon: Loel Lofty Dillingham, DO;  Location: [facility];  Service: Plastics;  Laterality: Right;  . APPLICATION OF WOUND VAC Right 06/29/2017   Procedure: APPLICATION OF WOUND VAC;  Surgeon: Wallace Going, DO;  [facility];  Service: Plastics;  Laterality: Right;  . DEBRIDEMENT CHEST WALL RIGHT/ VAC PLACEMENT  02-09-2015    [facility]  . EXCISIONAL TOTAL KNEE ARTHROPLASTY WITH ANTIBIOTIC SPACERS Right 12/02/2016   Procedure: EXCISIONAL TOTAL KNEE ARTHROPLASTY WITH ANTIBIOTIC SPACERS;  Surgeon: Alvan Dame,  Rodman Key, MD;  [facility];  Service: Orthopedics;  Laterality: Right;  90 mins  . HEMATOMA EVACUATION Right 06/29/2017   Procedure: EVACUATION HEMATOMA OF RIGHT KNEE;  Surgeon: Wallace Going, DO;  [facility];  Service: Plastics;  Laterality: Right;  . HEMATOMA EVACUATION Right 06/29/2017   Procedure: EVACUATION RIGHT TOTAL KNEE HEMATOMA;  Surgeon: Paralee Cancel, MD;  [facility];  Service: Orthopedics;  Laterality: Right;  . HEMATOMA EVACUATION Right 07/14/2017   Procedure: EVACUATION RIGHT LEG HEMATOMA WITH APPLICATION OF WOUND VAC;  Surgeon: Paralee Cancel, MD;  [facility];  Service: Orthopedics;  Laterality: Right;  . hemotoma     evacuation left chest wall  . I&D EXTREMITY Right 07/17/2017   Procedure: EVACUATION RIGHT LEG HEMATOMA WITH WOUND VAC DRESSING CHANGE;  Surgeon: Paralee Cancel, MD;  [facility];  Service: Orthopedics;  Laterality: Right;  . I&D KNEE WITH POLY EXCHANGE Right 05/28/2016   Procedure: IRRIGATION AND DEBRIDEMENT KNEE WOUND VAC PLACMENT;  Surgeon: Susa Day, MD;  [facility];  Service: Orthopedics;  Laterality: Right;  . I&D KNEE WITH POLY EXCHANGE Right 05/30/2016   Procedure: RADICAL SYNOVECTOMY,IRRIGATION AND DEBRIDEMENT KNEE WITH POLY EXCHANGE WITH ANTIBIOTIC BEADS, APPLICATION OF WOUND VAC;  Surgeon: Susa Day, MD;  [facility];  Service: Orthopedics;  Laterality: Right;  . INCISION AND DRAINAGE OF WOUND Right 07/31/2016   Procedure: IRRIGATION AND DEBRIDEMENT RIGHT KNEE  WOUND;  Surgeon: Loel Lofty Dillingham, DO;  Location: [place];  Service: Plastics;  Laterality: Right;  . IR FLUORO GUIDE CV LINE LEFT  04/30/2017  . IR US GUIDE VASC ACCESS LEFT  04/30/2017  . IRRIGATION AND DEBRIDEMENT KNEE Right 04/12/2016   Procedure: IRRIGATION AND DEBRIDEMENT KNEE;  Surgeon: Nicholes Stairs, MD;  [facility];  Service: Orthopedics;  Laterality: Right;  . IRRIGATION AND DEBRIDEMENT KNEE Right 12/16/2016   Procedure: Repeat irrigation and debridement right knee, wound closure  wound vac REPLACEMENT ANTIBIOTIC SPACERS;  Surgeon: Paralee Cancel, MD;  [facility];  Service: Orthopedics;  Laterality: Right;  . KNEE ARTHROSCOPY  02/13/2012   Procedure: ARTHROSCOPY KNEE;  Surgeon: Johnn Hai, MD;  Location: [facility];  Service: Orthopedics;  Laterality: Left;  WITH DEBRIDEMENt   . KNEE ARTHROSCOPY WITH LATERAL MENISECTOMY  02/13/2012   Procedure: KNEE ARTHROSCOPY WITH LATERAL MENISECTOMY;  Surgeon: Johnn Hai, MD;  Location: [facility];  Service: Orthopedics;;  partial  . LATISSIMUS  FLAP RIGHT CHEST  02-23-2015   [facility]  . LEFT MODIFIED RADICAL MASTECTOMY/ RIGHT TOTAL MASTECTOMY  11-13-2007   LEFT BREAST CANCER W/ AXILLARY LYMPH NODE METASTASIS AND POST NEOADJUVANT CHEMO  . MASS EXCISION Right 06/29/2017   Procedure: EXCISION OF METASTATIC BREAST CANCER TO SKIN RIGHT SHOULDER, PLACEMENT OF CELLERATE;  Surgeon: Wallace Going, DO;  [facility];  Service: Plastics;  Laterality: Right;  . PLACEMENT PORT-A-CATH  06/22/2007    new port placed 2015, right chest  . REIMPLANTATION OF TOTAL KNEE Right 04/27/2017   Procedure: Reimplantation of right total knee arthroplasty;  Surgeon: Paralee Cancel, MD;  [facility];  Service: Orthopedics;  Laterality: Right;  Adductor Block  . RESECTION OF ISOLATED CHEST WALL DISEASE/ ABDOMINAL SKIN FLAP  02/ 2015     [facility]   RIGHT CHEST WALL METASTATIC SKIN CARCINOMA  . SKIN SPLIT GRAFT Right 01/29/2017   Procedure: SKIN GRAFT SPLIT THICKNESS TO RIGHT KNEE WOUND;  Surgeon: Wallace Going, DO;  [facility];  Service: Clinical cytogeneticist;  Laterality: Right;  . SKIN SPLIT GRAFT Right 09/07/2017   Procedure: SKIN GRAFT SPLIT THICKNESS TO RIGHT KNEE WOUND WITH PLACEMENT OF VAC DRESSING TO GRAFT SITE;  Surgeon: Wallace Going, DO;  Location: [facility];  Service: Plastics;  Laterality: Right;  . TOTAL KNEE ARTHROPLASTY Left 09/23/2012   Procedure: LEFT TOTAL KNEE ARTHROPLASTY;  Surgeon: Johnn Hai, MD;  [facility];  Service: Orthopedics;  Laterality: Left;  . TOTAL KNEE ARTHROPLASTY Right 03/20/2016   Procedure: RIGHT TOTAL KNEE ARTHROPLASTY;  Surgeon: Susa Day, MD;  [facility];  Service: Orthopedics;  Laterality: Right;  . TRANSTHORACIC ECHOCARDIOGRAM  04/24/2017   ef 53-66%, grade 1 diastolic dysfuction/  mild MR with mild prolapse anterior leaflet/ mild TR/ PASP 60mmHg    There were no vitals filed for this visit.  Subjective Assessment - 10/15/17 1021    Subjective  I have a lung scan tomorrow.       Patient Stated Goals  improve standing and endurance    Currently in Pain?  No/denies                       [facility] Adult PT Treatment/Exercise - 10/15/17 0001      Knee/Hip Exercises: Aerobic   Nustep  L3 x 10 min      Knee/Hip Exercises: Standing   Hip Abduction  Both;20 reps;Knee straight    Abduction Limitations  3# added    Hip Extension  Both;20 reps;Knee straight    Extension Limitations  3# added    Rocker Board  3 minutes    Rebounder  weight shifting 3 ways- 1 minute each      Knee/Hip Exercises: Seated   Long Arc Quad  Both;20 reps    Long Arc Quad Weight  3 lbs.    Ball Squeeze  20 reps    Marching  Both;20 reps;Weights    Marching Weights  3 lbs.    Hamstring Curl  Strengthening;20 reps red theraband                  PT Long Term Goals - 10/07/17 0937      PT LONG TERM GOAL #1   Title  independent with HEP    Time  8    Period  Weeks    Status  On-going      PT LONG TERM GOAL #2   Title  demonstrate improved functional strength by performing 5x STS without UE support in < 16 sec    Baseline  19.2 seconds- uncontrolled descent    Time  8    Period  Weeks    Status  On-going      PT LONG TERM GOAL #5   Title  report pain < 4/10 with activity for improved mobility and function    Baseline  no knee pain reported    Status  Achieved            Plan - 10/15/17 1024    Clinical Impression Statement  Pt will have lung scan to determing if this is contributing to shortness of breath. PT didn't perform 3 minute walk test due to shortness of breath.  Pt demonstrates instability of both knees and hips due to chronic strength and endurance deficits.  Pt requires stand by assistance for exercise for safety.  Pt continues to be challenged by current exercises.  Pt will continue to benfit from skilled PT for LE strength,  endurance and gait.      Rehab Potential  Good    PT Frequency  2x / week    PT Duration  6 weeks    PT  Treatment/Interventions  ADLs/Self Care Home Management;Cryotherapy;Moist Heat;Therapeutic exercise;Therapeutic activities;Functional mobility training;Stair training;Gait training;Balance training;Neuromuscular re-education;Patient/family education;Manual techniques;Vasopneumatic Device;Taping;Passive range of motion    PT Next Visit Plan  advance exercise as able, Rt knee strength, endurance and flexibility.    3 minute walk test next.    PT Home Exercise Plan  Access Code: [email or web address]     Consulted and Agree with Plan of Care  Patient       Patient will benefit from skilled therapeutic intervention in order to improve the following deficits and impairments:  Abnormal gait, Pain, Decreased strength, Decreased activity tolerance, Decreased balance, Decreased mobility, Decreased range of motion  Visit Diagnosis: Acute pain of right knee  Stiffness of right knee, not elsewhere classified  Muscle weakness (generalized)  Other abnormalities of gait and mobility  Localized edema     Problem List Patient Active Problem List   Diagnosis Date Noted  . Wound, open with complication 44/05/270  . Fever   . Anemia due to chemotherapy 05/07/2017  . Abnormal LFTs 05/07/2017  . History of breast cancer   . Bilateral leg edema   . Enterococcal infection   . S/P revision of total knee, right 04/27/2017  . Pyogenic arthritis of right knee joint ([place])   . Acute blood loss anemia 12/12/2016  . Right knee abx spacer 12/02/2016  . Physical exam 07/31/2016  . Allergic reaction 07/24/2016  . Drug rash 07/24/2016  . S/P total knee arthroplasty, right 05/31/2016  . 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 Nonhealing surgical wound 05/28/2016  . Sepsis ([place]) 05/10/2016  . UTI (urinary tract infection) 05/10/2016  . Cellulitis of right knee 05/10/2016  . Blood clot in vein   . Wound dehiscence, surgical 04/12/2016  . 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 Vitamin D deficiency 01/25/2016  .  Metastatic cancer to chest wall ([place]) 02/02/2015  . Primary osteoarthritis of right knee 02/01/2015  . Symptomatic anemia 02/01/2015  . Frequent UTI 01/12/2014  . Sinus tachycardia 11/03/2013  . SOB (shortness of breath) 11/03/2013  . Postoperative anemia due to acute blood loss 12/24/2012  .    09/23/2012  . Sinusitis 07/28/2012  . Neuropathy due to chemotherapeutic drug ([place]) 07/25/2011  . 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 Metastatic breast cancer ([place]) 11/12/2010  . Chronic anticoagulation 11/12/2010  . Anxiety, generalized 03/26/2008    Sigurd Sos, PT 10/15/17 10:54 AM  [facility] [address] [place], [place], [address] Phone: [phone]   Fax:  [phone]  Name: [name] MRN: [id number] Date of Birth: 1956-01-08

## 2017-10-16 ENCOUNTER — Ambulatory Visit (HOSPITAL_COMMUNITY)
Admission: RE | Admit: 2017-10-16 | Discharge: 2017-10-16 | Disposition: A | Discharge status: Home / Self Care | Payer: 59 | Source: Ambulatory Visit | Attending: Internal Medicine | Admitting: Internal Medicine

## 2017-10-16 ENCOUNTER — Telehealth (HOSPITAL_COMMUNITY): Payer: Self-pay | Admitting: *Deleted

## 2017-10-16 ENCOUNTER — Encounter (HOSPITAL_COMMUNITY)
Admission: RE | Admit: 2017-10-16 | Discharge: 2017-10-16 | Disposition: A | Discharge status: Home / Self Care | Payer: 59 | Source: Ambulatory Visit | Attending: Internal Medicine | Admitting: Internal Medicine

## 2017-10-16 DIAGNOSIS — R0600 Dyspnea, unspecified: Secondary | ICD-10-CM | POA: Insufficient documentation

## 2017-10-16 DIAGNOSIS — I517 Cardiomegaly: Secondary | ICD-10-CM | POA: Diagnosis not present

## 2017-10-16 DIAGNOSIS — R0602 Shortness of breath: Secondary | ICD-10-CM | POA: Diagnosis not present

## 2017-10-16 MED ORDER — TECHNETIUM TC 99M DIETHYLENETRIAME-PENTAACETIC ACID
32.0000 | Freq: Once | INTRAVENOUS | Status: AC | PRN
  Administered 2017-10-16: 32 via RESPIRATORY_TRACT

## 2017-10-16 MED ORDER — TECHNETIUM TO 99M ALBUMIN AGGREGATED
4.0000 | Freq: Once | INTRAVENOUS | Status: AC | PRN
  Administered 2017-10-16: 4 via INTRAVENOUS

## 2017-10-16 MED FILL — DOXYCYCLINE MONO 100 MG CAP: 100 | 30 days supply | Qty: 60 | Fill #4

## 2017-10-16 NOTE — Telephone Encounter (Signed)
Pt left VM requesting we fax last office note and echo report to [place] at [place]. I faxed both to [phone] today at 10:18am.

## 2017-10-20 DIAGNOSIS — Z5112 Encounter for antineoplastic immunotherapy: Secondary | ICD-10-CM | POA: Diagnosis not present

## 2017-10-20 DIAGNOSIS — C771 Secondary and unspecified malignant neoplasm of intrathoracic lymph nodes: Secondary | ICD-10-CM | POA: Diagnosis not present

## 2017-10-20 DIAGNOSIS — J3800 Paralysis of vocal cords and larynx, unspecified: Secondary | ICD-10-CM | POA: Diagnosis not present

## 2017-10-20 DIAGNOSIS — C792 Secondary malignant neoplasm of skin: Secondary | ICD-10-CM | POA: Diagnosis not present

## 2017-10-20 DIAGNOSIS — G629 Polyneuropathy, unspecified: Secondary | ICD-10-CM | POA: Diagnosis not present

## 2017-10-20 DIAGNOSIS — C50912 Malignant neoplasm of unspecified site of left female breast: Secondary | ICD-10-CM | POA: Diagnosis not present

## 2017-10-20 DIAGNOSIS — Z5111 Encounter for antineoplastic chemotherapy: Secondary | ICD-10-CM | POA: Diagnosis not present

## 2017-10-20 DIAGNOSIS — C50412 Malignant neoplasm of upper-outer quadrant of left female breast: Secondary | ICD-10-CM | POA: Diagnosis not present

## 2017-10-20 DIAGNOSIS — T86821 Skin graft (allograft) (autograft) failure: Secondary | ICD-10-CM | POA: Diagnosis not present

## 2017-10-20 DIAGNOSIS — Z171 Estrogen receptor negative status [ER-]: Secondary | ICD-10-CM | POA: Diagnosis not present

## 2017-10-20 DIAGNOSIS — I5020 Unspecified systolic (congestive) heart failure: Secondary | ICD-10-CM | POA: Diagnosis not present

## 2017-10-22 ENCOUNTER — Ambulatory Visit: Payer: 59 | Admitting: Physical Therapy

## 2017-10-22 ENCOUNTER — Encounter: Payer: Self-pay | Admitting: Physical Therapy

## 2017-10-22 DIAGNOSIS — R2689 Other abnormalities of gait and mobility: Secondary | ICD-10-CM

## 2017-10-22 DIAGNOSIS — M25661 Stiffness of right knee, not elsewhere classified: Secondary | ICD-10-CM | POA: Diagnosis not present

## 2017-10-22 DIAGNOSIS — M25561 Pain in right knee: Secondary | ICD-10-CM

## 2017-10-22 DIAGNOSIS — R600 Localized edema: Secondary | ICD-10-CM | POA: Diagnosis not present

## 2017-10-22 DIAGNOSIS — M6281 Muscle weakness (generalized): Secondary | ICD-10-CM | POA: Diagnosis not present

## 2017-10-22 NOTE — Therapy (Signed)
[facility] [address] [place], [place], [address] Phone: [phone]   Fax:  [phone]  Physical Therapy Treatment/Recertification   Patient Details  Name: [name] MRN: [id number] Date of Birth: 27-Apr-1956 Referring Provider: Dr. Paralee Cancel   Encounter Date: 10/22/2017  PT End of Session - 10/22/17 0915    Visit Number  10    Number of Visits  12    Date for PT Re-Evaluation  12/03/17    Authorization Type  [facility] [employer] (prior auth required for secondary insurance); 24 visits total approved (9 used prior to 09/02/17 visit); will need to request additional visits with review from insurance if needed past 24 visits    Authorization - Visit Number  61    Authorization - Number of Visits  24    PT Start Time  0850    PT Stop Time  0935    PT Time Calculation (min)  45 min    Activity Tolerance  Patient tolerated treatment well       Progress Note Reporting Period 09/02/17 to 10/14/17  See note below for Objective Data and Assessment of Progress/Goals.       Past Medical History:  Diagnosis Date  . Arthritis   . 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 Heart murmur   . 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 MVP (mitral valve prolapse)    mild mvp w/ mild regurg. per last echo 04-24-2017  . Neuropathy due to chemotherapeutic drug ([place])   . Non-healing surgical wound    right knee post re-implantation total knee arthroplasty 04-2017 unable to completely close surgical incision  . PONV (postoperative nausea and vomiting)    ponv likes scopolamine patch  . Port-A-Cath in place    RIGHT CHEST   . Red blood cell antibody positive   . Skin changes related to chemotherapy    per patient she has scabbed over skin circumventing port to right upper chest ;  state it is due to chemptherapy   . 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 APPLICATION OF A-CELL OF BACK Right 07/31/2016   Procedure: CELLERATE COLLAGEN PLACEMENT;  Surgeon: Loel Lofty Dillingham, DO;  Location: [facility];  Service: Plastics;  Laterality: Right;  . APPLICATION OF WOUND VAC Right 06/29/2017   Procedure: APPLICATION OF WOUND VAC;  Surgeon: Wallace Going, DO;  Location: [facility];  Service: Plastics;  Laterality: Right;  . DEBRIDEMENT CHEST WALL RIGHT/ VAC PLACEMENT  02-09-2015    [facility]  .  EXCISIONAL TOTAL KNEE ARTHROPLASTY WITH ANTIBIOTIC SPACERS Right 12/02/2016   Procedure: EXCISIONAL TOTAL KNEE ARTHROPLASTY WITH ANTIBIOTIC SPACERS;  Surgeon: Paralee Cancel, MD;  Location: [facility];  Service: Orthopedics;  Laterality: Right;  90 mins  . HEMATOMA EVACUATION Right 06/29/2017   Procedure: EVACUATION HEMATOMA OF RIGHT KNEE;  Surgeon: Wallace Going, DO;  Location: [facility];  Service: Plastics;  Laterality: Right;  . HEMATOMA EVACUATION Right 06/29/2017   Procedure: EVACUATION RIGHT TOTAL KNEE HEMATOMA;  Surgeon: Paralee Cancel, MD;  [facility];  Service: Orthopedics;  Laterality: Right;  . HEMATOMA EVACUATION Right 07/14/2017    Procedure: EVACUATION RIGHT LEG HEMATOMA WITH APPLICATION OF WOUND VAC;  Surgeon: Paralee Cancel, MD;  [facility];  Service: Orthopedics;  Laterality: Right;  . hemotoma     evacuation left chest wall  . I&D EXTREMITY Right 07/17/2017   Procedure: EVACUATION RIGHT LEG HEMATOMA WITH WOUND VAC DRESSING CHANGE;  Surgeon: Paralee Cancel, MD;  [facility];  Service: Orthopedics;  Laterality: Right;  . I&D KNEE WITH POLY EXCHANGE Right 05/28/2016   Procedure: IRRIGATION AND DEBRIDEMENT KNEE WOUND VAC PLACMENT;  Surgeon: Susa Day, MD;  [facility];  Service: Orthopedics;  Laterality: Right;  . I&D KNEE WITH POLY EXCHANGE Right 05/30/2016   Procedure: RADICAL SYNOVECTOMY,IRRIGATION AND DEBRIDEMENT KNEE WITH POLY EXCHANGE WITH ANTIBIOTIC BEADS, APPLICATION OF WOUND VAC;  Surgeon: Susa Day, MD;  [facility];  Service: Orthopedics;  Laterality: Right;  . INCISION AND DRAINAGE OF WOUND Right 07/31/2016   Procedure: IRRIGATION AND DEBRIDEMENT RIGHT KNEE  WOUND;  Surgeon: Loel Lofty Dillingham, DO;  Location: [facility];  Service: Plastics;  Laterality: Right;  . IR FLUORO GUIDE CV LINE LEFT  04/30/2017  . IR US GUIDE VASC ACCESS LEFT  04/30/2017  . IRRIGATION AND DEBRIDEMENT KNEE Right 04/12/2016   Procedure: IRRIGATION AND DEBRIDEMENT KNEE;  Surgeon: Nicholes Stairs, MD;  [facility];  Service: Orthopedics;  Laterality: Right;  . IRRIGATION AND DEBRIDEMENT KNEE Right 12/16/2016   Procedure: Repeat irrigation and debridement right knee, wound closure  wound vac REPLACEMENT ANTIBIOTIC SPACERS;  Surgeon: Paralee Cancel, MD;  Location[facility];  Service: Orthopedics;  Laterality: Right;  . KNEE ARTHROSCOPY  02/13/2012   Procedure: ARTHROSCOPY KNEE;  Surgeon: Johnn Hai, MD;  Location: [facility];  Service: Orthopedics;  Laterality: Left;  WITH DEBRIDEMENt   . KNEE ARTHROSCOPY WITH LATERAL MENISECTOMY  02/13/2012   Procedure: KNEE ARTHROSCOPY WITH LATERAL MENISECTOMY;   Surgeon: Johnn Hai, MD;  Location: [facility];  Service: Orthopedics;;  partial  . LATISSIMUS FLAP RIGHT CHEST  02-23-2015   [facility]  . LEFT MODIFIED RADICAL MASTECTOMY/ RIGHT TOTAL MASTECTOMY  11-13-2007   LEFT BREAST CANCER W/ AXILLARY LYMPH NODE METASTASIS AND POST NEOADJUVANT CHEMO  . MASS EXCISION Right 06/29/2017   Procedure: EXCISION OF METASTATIC BREAST CANCER TO SKIN RIGHT SHOULDER, PLACEMENT OF CELLERATE;  Surgeon: Wallace Going, DO;  Location[facility];  Service: Plastics;  Laterality: Right;  . PLACEMENT PORT-A-CATH  06/22/2007    new port placed 2015, right chest  . REIMPLANTATION OF TOTAL KNEE Right 04/27/2017   Procedure: Reimplantation of right total knee arthroplasty;  Surgeon: Paralee Cancel, MD;  [facility];  Service: Orthopedics;  Laterality: Right;  Adductor Block  . RESECTION OF ISOLATED CHEST WALL DISEASE/ ABDOMINAL SKIN FLAP  02/ 2015     [facility]   RIGHT CHEST WALL METASTATIC SKIN CARCINOMA  . SKIN SPLIT GRAFT Right  01/29/2017   Procedure: SKIN GRAFT SPLIT THICKNESS TO RIGHT KNEE WOUND;  Surgeon: Wallace Going, DO;  [facility];  Service: Plastics;  Laterality: Right;  . SKIN SPLIT GRAFT Right 09/07/2017   Procedure: SKIN GRAFT SPLIT THICKNESS TO RIGHT KNEE WOUND WITH PLACEMENT OF VAC DRESSING TO GRAFT SITE;  Surgeon: Wallace Going, DO;  Location: [facility];  Service: Plastics;  Laterality: Right;  . TOTAL KNEE ARTHROPLASTY Left 09/23/2012   Procedure: LEFT TOTAL KNEE ARTHROPLASTY;  Surgeon: Johnn Hai, MD;  [facility];  Service: Orthopedics;  Laterality: Left;  . TOTAL KNEE ARTHROPLASTY Right 03/20/2016   Procedure: RIGHT TOTAL KNEE ARTHROPLASTY;  Surgeon: Susa Day, MD;  [facility];  Service: Orthopedics;  Laterality: Right;  . TRANSTHORACIC ECHOCARDIOGRAM  04/24/2017   ef 41-74%, grade 1 diastolic dysfuction/  mild MR with mild prolapse anterior leaflet/ mild TR/ PASP 81mmHg    There were no  vitals filed for this visit.  Subjective Assessment - 10/22/17 0853    Subjective  Patient got appt times confused and arrives at the wrong time.   Reports bilateral knee pain.  Left feels tight.      Patient Stated Goals  improve standing and endurance    Currently in Pain?  Yes    Pain Score  5     Pain Location  Knee    Pain Orientation  Right;Left    Pain Type  Surgical pain;Chronic pain    Pain Onset  More than a month ago    Pain Frequency  Intermittent    Aggravating Factors   constant on left;  right with sitting too long         [facility] PT Assessment - 10/22/17 0001      Observation/Other Assessments   Focus on Therapeutic Outcomes (FOTO)   42% limitation      AROM   Right Knee Extension  0    Right Knee Flexion  105      Strength   Right Hip Flexion  4-/5    Right Hip ABduction  4-/5    Right Hip ADduction  4-/5    Left Hip Flexion  4/5    Left Hip ABduction  4-/5    Left Hip ADduction  4-/5    Right Knee Flexion  4+/5    Right Knee Extension  4-/5 with quad lag    Left Knee Flexion  4-/5    Left Knee Extension  4/5      Transfers   Five time sit to stand comments   17.87 seconds without UE support      6 minute walk test results    Endurance additional comments  3 min walk test: 610                   [facility] Adult PT Treatment/Exercise - 10/22/17 0001      Knee/Hip Exercises: Aerobic   Nustep  L3 x 10 min      Knee/Hip Exercises: Standing   Hip Abduction  Both;20 reps;Knee straight    Abduction Limitations  3# added    Hip Extension  Both;20 reps;Knee straight    Extension Limitations  3# added    Rocker Board  3 minutes    Rebounder  weight shifting 3 ways- 1 minute each      Knee/Hip Exercises: Seated   Long Arc Quad  Both;20 reps    Long Arc Quad Weight  3 lbs.  Ball Squeeze  20 reps    Marching  Both;20 reps;Weights    Marching Weights  3 lbs.    Hamstring Curl  Strengthening;20 reps red theraband                   PT Long Term Goals - 10/22/17 0913      PT LONG TERM GOAL #1   Title  independent with HEP    Time  8    Period  Weeks    Status  On-going      PT LONG TERM GOAL #2   Title  demonstrate improved functional strength by performing 5x STS without UE support in < 16 sec    Time  8    Period  Weeks    Status  On-going      PT LONG TERM GOAL #3   Title  improve functional endurance by performing 3MWT with at least 700'     Time  8    Period  Weeks    Status  On-going      PT LONG TERM GOAL #4   Title  FOTO score improved to </= 40% limitation for improved function    Time  8    Period  Weeks    Status  On-going      PT LONG TERM GOAL #5   Title  report pain < 4/10 with activity for improved mobility and function    Time  8    Period  Weeks    Status  On-going      Additional Long Term Goals   Additional Long Term Goals  Yes      PT LONG TERM GOAL #6   Title  The patient will have the stamina  to walk in the grocery store 10-15 minutes.    Time  6    Period  Weeks    Status  New            Plan - 10/22/17 7824    Clinical Impression Statement  The patient has improved with 3 min walk test distance by  > 50 feet.  Her 5x sit to stand test has improved as well and she is now able to rise without UE use.  Her FOTO score has improved from 50% limitation to 42%.  She has mild shortness of breath with standing exercises but she denies the need for longer rest breaks.  Her goal is to improve her stamina for walking in the grocery store > 10-15 minutes.  She would benefit from continued PT for further strengthening,  conditioning and functional reactivation.  She continues to make progress but it has been slowed by her numerous health issues.      Rehab Potential  Good    PT Frequency  2x / week    PT Duration  6 weeks    PT Treatment/Interventions  ADLs/Self Care Home Management;Cryotherapy;Moist Heat;Therapeutic exercise;Therapeutic  activities;Functional mobility training;Stair training;Gait training;Balance training;Neuromuscular re-education;Patient/family education;Manual techniques;Vasopneumatic Device;Taping;Passive range of motion    PT Next Visit Plan  advance exercise as able, Rt knee strength, endurance and flexibility       Patient will benefit from skilled therapeutic intervention in order to improve the following deficits and impairments:  Abnormal gait, Pain, Decreased strength, Decreased activity tolerance, Decreased balance, Decreased mobility, Decreased range of motion  Visit Diagnosis: Acute pain of right knee - Plan: PT plan of care cert/re-cert  Stiffness of right knee, not elsewhere classified - Plan: PT  plan of care cert/re-cert  Muscle weakness (generalized) - Plan: PT plan of care cert/re-cert  Other abnormalities of gait and mobility - Plan: PT plan of care cert/re-cert     Problem List Patient Active Problem List   Diagnosis Date Noted  . Wound, open with complication 02/15/3006  . Fever   . Anemia due to chemotherapy 05/07/2017  . Abnormal LFTs 05/07/2017  . History of breast cancer   . Bilateral leg edema   . Enterococcal infection   . S/P revision of total knee, right 04/27/2017  . Pyogenic arthritis of right knee joint ([place])   . Acute blood loss anemia 12/12/2016  . Right knee abx spacer 12/02/2016  . Physical exam 07/31/2016  . Allergic reaction 07/24/2016  . Drug rash 07/24/2016  . S/P total knee arthroplasty, right 05/31/2016  . 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 Nonhealing surgical wound 05/28/2016  . Sepsis ([place]) 05/10/2016  . UTI (urinary tract infection) 05/10/2016  . Cellulitis of right knee 05/10/2016  . Blood clot in vein   . Wound dehiscence, surgical 04/12/2016  . 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 Vitamin D deficiency 01/25/2016  . 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 Primary osteoarthritis of right knee 02/01/2015  . Symptomatic  anemia 02/01/2015  . Frequent UTI 01/12/2014  . Sinus tachycardia 11/03/2013  . SOB (shortness of breath) 11/03/2013  . Postoperative anemia due to acute blood loss 12/24/2012  .    09/23/2012  . Sinusitis 07/28/2012  . Neuropathy due to chemotherapeutic drug ([place]) 07/25/2011  . 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 Metastatic breast cancer ([place]) 11/12/2010  . Chronic anticoagulation 11/12/2010  . Anxiety, generalized 03/26/2008   Ruben Im, PT 10/22/17 9:53 AM Phone: [phone] Fax: [phone]  Alvera Singh 10/22/2017, 9:52 AM  [facility] [address] [place], [place], [address] Phone: [phone]   Fax:  [phone]  Name: KERSTEN SALMONS MRN: [id number] Date of Birth: January 16, 1956

## 2017-10-26 ENCOUNTER — Encounter: Payer: Self-pay | Admitting: Physical Therapy

## 2017-10-27 MED FILL — ELIQUIS 5 MG TABLET: 5 | 30 days supply | Qty: 60 | Fill #1

## 2017-10-27 MED FILL — TYKERB 250 MG TABLET: 250 | 28 days supply | Qty: 42 | Fill #1

## 2017-10-28 ENCOUNTER — Ambulatory Visit: Payer: 59 | Attending: Orthopedic Surgery

## 2017-10-28 DIAGNOSIS — M25661 Stiffness of right knee, not elsewhere classified: Secondary | ICD-10-CM | POA: Insufficient documentation

## 2017-10-28 DIAGNOSIS — M25561 Pain in right knee: Secondary | ICD-10-CM | POA: Diagnosis not present

## 2017-10-28 DIAGNOSIS — R600 Localized edema: Secondary | ICD-10-CM | POA: Diagnosis not present

## 2017-10-28 DIAGNOSIS — R2689 Other abnormalities of gait and mobility: Secondary | ICD-10-CM | POA: Insufficient documentation

## 2017-10-28 DIAGNOSIS — M6281 Muscle weakness (generalized): Secondary | ICD-10-CM | POA: Diagnosis not present

## 2017-10-28 NOTE — Therapy (Signed)
[facility] [address], [place], [place], [address] Phone: [phone]   Fax:  [phone]  Physical Therapy Treatment  Patient Details  Name: [name] MRN: [id number] Date of Birth: 31-Jan-1956 Referring Provider: Dr. Paralee Cancel   Encounter Date: 10/28/2017  PT End of Session - 10/28/17 0958    Visit Number  11    Date for PT Re-Evaluation  12/03/17    Authorization Type  [facility] [employer] (prior auth required for secondary insurance); 24 visits total approved (9 used prior to 09/02/17 visit); will need to request additional visits with review from insurance if needed past 24 visits    Authorization - Visit Number  20    Authorization - Number of Visits  24    PT Start Time  0921    PT Stop Time  1000    PT Time Calculation (min)  39 min    Activity Tolerance  Patient tolerated treatment well    Behavior During Therapy  [facility] for tasks assessed/performed       Past Medical History:  Diagnosis Date  . Arthritis   . 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   primary left breast cancer dx 01/ 2009 ; 11/ 2009 recurrence left chest wall and neck, tx clinical trial drug and chemotherapy;  2015 recurrence cutaneous metastatized to right skin/ chest wall, 02/ 2015 resection chest wall disease and 02-23-2015 s/p skin flap surgery,  chemo every 3 weeks  . 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 Heart murmur   . 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 MVP (mitral valve prolapse)    mild mvp w/ mild regurg. per last echo  04-24-2017  . Neuropathy due to chemotherapeutic drug ([place])   . Non-healing surgical wound    right knee post re-implantation total knee arthroplasty 04-2017 unable to completely close surgical incision  . PONV (postoperative nausea and vomiting)    ponv likes scopolamine patch  . Port-A-Cath in place    RIGHT CHEST   . Red blood cell antibody positive   . Skin changes related to chemotherapy    per patient she has scabbed over skin circumventing port to right upper chest ;  state it is due to chemptherapy   . 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 APPLICATION OF A-CELL OF BACK Right 07/31/2016   Procedure: CELLERATE COLLAGEN PLACEMENT;  Surgeon: Loel Lofty Dillingham, DO;  Location: [place];  Service: Plastics;  Laterality: Right;  . APPLICATION OF WOUND VAC Right 06/29/2017   Procedure: APPLICATION OF WOUND VAC;  Surgeon: Wallace Going, DO;  Location: [facility];  Service: Plastics;  Laterality: Right;  . DEBRIDEMENT CHEST WALL RIGHT/ VAC PLACEMENT  02-09-2015    [facility]  . EXCISIONAL TOTAL KNEE ARTHROPLASTY WITH ANTIBIOTIC SPACERS Right 12/02/2016   Procedure: EXCISIONAL TOTAL KNEE ARTHROPLASTY WITH ANTIBIOTIC SPACERS;  Surgeon: Alvan Dame,  Rodman Key, MD;  Location: [facility];  Service: Orthopedics;  Laterality: Right;  90 mins  . HEMATOMA EVACUATION Right 06/29/2017   Procedure: EVACUATION HEMATOMA OF RIGHT KNEE;  Surgeon: Wallace Going, DO;  [facility];  Service: Plastics;  Laterality: Right;  . HEMATOMA EVACUATION Right 06/29/2017   Procedure: EVACUATION RIGHT TOTAL KNEE HEMATOMA;  Surgeon: Paralee Cancel, MD;  [facility];  Service: Orthopedics;  Laterality: Right;  . HEMATOMA EVACUATION Right 07/14/2017   Procedure: EVACUATION RIGHT LEG HEMATOMA WITH APPLICATION OF WOUND VAC;  Surgeon: Paralee Cancel, MD;  [facility];  Service: Orthopedics;  Laterality: Right;  . hemotoma     evacuation left chest wall  . I&D EXTREMITY Right 07/17/2017   Procedure: EVACUATION RIGHT LEG HEMATOMA WITH WOUND VAC DRESSING CHANGE;  Surgeon: Paralee Cancel, MD;  [facility];  Service: Orthopedics;  Laterality: Right;  . I&D KNEE WITH POLY EXCHANGE Right 05/28/2016   Procedure: IRRIGATION AND DEBRIDEMENT KNEE WOUND VAC PLACMENT;  Surgeon: Susa Day, MD;  [facility];  Service: Orthopedics;  Laterality: Right;  . I&D KNEE WITH POLY EXCHANGE Right 05/30/2016   Procedure: RADICAL SYNOVECTOMY,IRRIGATION AND DEBRIDEMENT KNEE WITH POLY EXCHANGE WITH ANTIBIOTIC BEADS, APPLICATION OF WOUND VAC;  Surgeon: Susa Day, MD;  [facility];  Service: Orthopedics;  Laterality: Right;  . INCISION AND DRAINAGE OF WOUND Right 07/31/2016   Procedure: IRRIGATION AND DEBRIDEMENT RIGHT KNEE  WOUND;  Surgeon: Loel Lofty Dillingham, DO;  Location: [place];  Service: Plastics;  Laterality: Right;  . IR FLUORO GUIDE CV LINE LEFT  04/30/2017  . IR US GUIDE VASC ACCESS LEFT  04/30/2017  . IRRIGATION AND DEBRIDEMENT KNEE Right 04/12/2016   Procedure: IRRIGATION AND DEBRIDEMENT KNEE;  Surgeon: Nicholes Stairs, MD;  [facility];  Service: Orthopedics;  Laterality: Right;  . IRRIGATION AND DEBRIDEMENT KNEE Right 12/16/2016   Procedure: Repeat irrigation and debridement right knee, wound closure  wound vac REPLACEMENT ANTIBIOTIC SPACERS;  Surgeon: Paralee Cancel, MD;  [facility];  Service: Orthopedics;  Laterality: Right;  . KNEE ARTHROSCOPY  02/13/2012   Procedure: ARTHROSCOPY KNEE;  Surgeon: Johnn Hai, MD;  Location: [facility];  Service: Orthopedics;  Laterality: Left;  WITH DEBRIDEMENt   . KNEE ARTHROSCOPY WITH LATERAL MENISECTOMY  02/13/2012   Procedure: KNEE ARTHROSCOPY WITH LATERAL MENISECTOMY;  Surgeon: Johnn Hai, MD;  Location: [facility];  Service: Orthopedics;;  partial  . LATISSIMUS  FLAP RIGHT CHEST  02-23-2015   [facility]  . LEFT MODIFIED RADICAL MASTECTOMY/ RIGHT TOTAL MASTECTOMY  11-13-2007   LEFT BREAST CANCER W/ AXILLARY LYMPH NODE METASTASIS AND POST NEOADJUVANT CHEMO  . MASS EXCISION Right 06/29/2017   Procedure: EXCISION OF METASTATIC BREAST CANCER TO SKIN RIGHT SHOULDER, PLACEMENT OF CELLERATE;  Surgeon: Wallace Going, DO;  [facility];  Service: Plastics;  Laterality: Right;  . PLACEMENT PORT-A-CATH  06/22/2007    new port placed 2015, right chest  . REIMPLANTATION OF TOTAL KNEE Right 04/27/2017   Procedure: Reimplantation of right total knee arthroplasty;  Surgeon: Paralee Cancel, MD;  [facility];  Service: Orthopedics;  Laterality: Right;  Adductor Block  . RESECTION OF ISOLATED CHEST WALL DISEASE/ ABDOMINAL SKIN FLAP  02/ 2015     [facility]   RIGHT CHEST WALL METASTATIC SKIN CARCINOMA  . SKIN SPLIT GRAFT Right 01/29/2017   Procedure: SKIN GRAFT SPLIT THICKNESS TO RIGHT KNEE WOUND;  Surgeon: Wallace Going, DO;  [facility];  Service: Clinical cytogeneticist;  Laterality: Right;  . SKIN SPLIT GRAFT Right 09/07/2017   Procedure: SKIN GRAFT SPLIT THICKNESS TO RIGHT KNEE WOUND WITH PLACEMENT OF VAC DRESSING TO GRAFT SITE;  Surgeon: Wallace Going, DO;  Location: [place];  Service: Plastics;  Laterality: Right;  . TOTAL KNEE ARTHROPLASTY Left 09/23/2012   Procedure: LEFT TOTAL KNEE ARTHROPLASTY;  Surgeon: Johnn Hai, MD;  [facility];  Service: Orthopedics;  Laterality: Left;  . TOTAL KNEE ARTHROPLASTY Right 03/20/2016   Procedure: RIGHT TOTAL KNEE ARTHROPLASTY;  Surgeon: Susa Day, MD;  [facility];  Service: Orthopedics;  Laterality: Right;  . TRANSTHORACIC ECHOCARDIOGRAM  04/24/2017   ef 09-60%, grade 1 diastolic dysfuction/  mild MR with mild prolapse anterior leaflet/ mild TR/ PASP 51mmHg    There were no vitals filed for this visit.  Subjective Assessment - 10/28/17 0939    Subjective  I was sick earlier this week so  I couldn't come to my appointment.      Patient Stated Goals  improve standing and endurance    Currently in Pain?  Yes    Pain Score  3     Pain Location  Knee    Pain Orientation  Right;Left    Pain Descriptors / Indicators  Tightness    Pain Onset  More than a month ago    Pain Frequency  Intermittent    Aggravating Factors   constant on left, Rt with sitting too long    Pain Relieving Factors  medication, ice, elevation                        [facility] Adult PT Treatment/Exercise - 10/28/17 0001      Knee/Hip Exercises: Aerobic   Nustep  L3 x 10 min      Knee/Hip Exercises: Standing   Hip Abduction  Both;20 reps;Knee straight    Abduction Limitations  3# added    Hip Extension  Both;20 reps;Knee straight    Extension Limitations  3# added    Rocker Board  3 minutes    Rebounder  weight shifting 3 ways- 1 minute each      Knee/Hip Exercises: Seated   Long Arc Quad  Both;20 reps    Long Arc Quad Weight  3 lbs.    Ball Squeeze  20 reps    Marching  Both;20 reps;Weights    Marching Weights  3 lbs.    Hamstring Curl  Strengthening;20 reps red theraband                  PT Long Term Goals - 10/22/17 0913      PT LONG TERM GOAL #1   Title  independent with HEP    Time  8    Period  Weeks    Status  On-going      PT LONG TERM GOAL #2   Title  demonstrate improved functional strength by performing 5x STS without UE support in < 16 sec    Time  8    Period  Weeks    Status  On-going      PT LONG TERM GOAL #3   Title  improve functional endurance by performing 3MWT with at least 700'     Time  8    Period  Weeks    Status  On-going      PT LONG TERM GOAL #4   Title  FOTO score improved to </= 40%  limitation for improved function    Time  8    Period  Weeks    Status  On-going      PT LONG TERM GOAL #5   Title  report pain < 4/10 with activity for improved mobility and function    Time  8    Period  Weeks    Status  On-going       Additional Long Term Goals   Additional Long Term Goals  Yes      PT LONG TERM GOAL #6   Title  The patient will have the stamina  to walk in the grocery store 10-15 minutes.    Time  6    Period  Weeks    Status  New            Plan - 10/28/17 0950    Clinical Impression Statement  Pt was sick earlier this week.  3 minute walk test and 5x sit to stand was improved last week.  Pt with very mild shortness of breath with standing exercises and no need for significant breaks.  Pt had lung scan last week and no clots found.  MD will monitor.  Pt with slow progression due to other health issues.  Pt will continue to benefit from skilled PT for LE strength, functional mobility and balance to improve safety.      Rehab Potential  Good    PT Frequency  2x / week    PT Duration  6 weeks    PT Treatment/Interventions  ADLs/Self Care Home Management;Cryotherapy;Moist Heat;Therapeutic exercise;Therapeutic activities;Functional mobility training;Stair training;Gait training;Balance training;Neuromuscular re-education;Patient/family education;Manual techniques;Vasopneumatic Device;Taping;Passive range of motion    PT Next Visit Plan  advance exercise as able, Rt knee strength, endurance and flexibility    PT Home Exercise Plan  Access Code: [email or web address]     Recommended Other Services  initial cert and recert are signed    Consulted and Agree with Plan of Care  Patient       Patient will benefit from skilled therapeutic intervention in order to improve the following deficits and impairments:  Abnormal gait, Pain, Decreased strength, Decreased activity tolerance, Decreased balance, Decreased mobility, Decreased range of motion  Visit Diagnosis: Acute pain of right knee  Stiffness of right knee, not elsewhere classified  Muscle weakness (generalized)  Other abnormalities of gait and mobility  Localized edema     Problem List Patient Active Problem List   Diagnosis Date Noted  . Wound, open  with complication 32/35/5732  . Fever   . Anemia due to chemotherapy 05/07/2017  . Abnormal LFTs 05/07/2017  . History of breast cancer   . Bilateral leg edema   . Enterococcal infection   . S/P revision of total knee, right 04/27/2017  . Pyogenic arthritis of right knee joint ([place])   . Acute blood loss anemia 12/12/2016  . Right knee abx spacer 12/02/2016  . Physical exam 07/31/2016  . Allergic reaction 07/24/2016  . Drug rash 07/24/2016  . S/P total knee arthroplasty, right 05/31/2016  . 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 Nonhealing surgical wound 05/28/2016  . Sepsis ([place]) 05/10/2016  . UTI (urinary tract infection) 05/10/2016  . Cellulitis of right knee 05/10/2016  . Blood clot in vein   . Wound dehiscence, surgical 04/12/2016  . 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 Vitamin D deficiency 01/25/2016  . 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 Primary osteoarthritis of right knee 02/01/2015  . Symptomatic anemia 02/01/2015  .  Frequent UTI 01/12/2014  . Sinus tachycardia 11/03/2013  . SOB (shortness of breath) 11/03/2013  . Postoperative anemia due to acute blood loss 12/24/2012  .    09/23/2012  . Sinusitis 07/28/2012  . Neuropathy due to chemotherapeutic drug ([place]) 07/25/2011  . 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 Metastatic breast cancer ([place]) 11/12/2010  . Chronic anticoagulation 11/12/2010  . Anxiety, generalized 03/26/2008     Sigurd Sos, PT 10/28/17 10:06 AM  [facility] [address], [place], [place], [address] Phone: [phone]   Fax:  [phone]  Name: [name] MRN: [id number] Date of Birth: [birth year]

## 2017-10-30 MED FILL — GABAPENTIN 300 MG CAPSULE: 300 | 30 days supply | Qty: 90 | Fill #0

## 2017-10-30 MED FILL — DARIFENACIN ER 15 MG TABLET: 15 | 30 days supply | Qty: 30 | Fill #10

## 2017-11-02 ENCOUNTER — Encounter: Payer: Self-pay | Admitting: Physical Therapy

## 2017-11-02 ENCOUNTER — Ambulatory Visit: Payer: 59 | Admitting: Physical Therapy

## 2017-11-02 DIAGNOSIS — R2689 Other abnormalities of gait and mobility: Secondary | ICD-10-CM | POA: Diagnosis not present

## 2017-11-02 DIAGNOSIS — R600 Localized edema: Secondary | ICD-10-CM

## 2017-11-02 DIAGNOSIS — M25661 Stiffness of right knee, not elsewhere classified: Secondary | ICD-10-CM | POA: Diagnosis not present

## 2017-11-02 DIAGNOSIS — M6281 Muscle weakness (generalized): Secondary | ICD-10-CM

## 2017-11-02 DIAGNOSIS — M25561 Pain in right knee: Secondary | ICD-10-CM | POA: Diagnosis not present

## 2017-11-02 NOTE — Therapy (Signed)
[facility] [address] [place], [place], [address] Phone: [phone]   Fax:  [phone]  Physical Therapy Treatment  Patient Details  Name: [name] MRN: [id number] Date of Birth: [birth year] Referring Provider: Dr. Paralee Cancel   Encounter Date: 11/02/2017  PT End of Session - 11/02/17 1138    Visit Number  12    Number of Visits  24    Date for PT Re-Evaluation  12/03/17    Authorization Type  [facility] [employer] (prior auth required for secondary insurance); 24 visits total approved (9 used prior to 09/02/17 visit); will need to request additional visits with review from insurance if needed past 24 visits    Authorization - Visit Number  21    Authorization - Number of Visits  24    PT Start Time  1130    PT Stop Time  1210    PT Time Calculation (min)  40 min    Activity Tolerance  Patient tolerated treatment well    Behavior During Therapy  [facility] for tasks assessed/performed       Past Medical History:  Diagnosis Date  . Arthritis   . 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 Heart murmur   . 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 MVP (mitral valve prolapse)    mild mvp w/  mild regurg. per last echo 04-24-2017  . Neuropathy due to chemotherapeutic drug ([place])   . Non-healing surgical wound    right knee post re-implantation total knee arthroplasty 04-2017 unable to completely close surgical incision  . PONV (postoperative nausea and vomiting)    ponv likes scopolamine patch  . Port-A-Cath in place    RIGHT CHEST   . Red blood cell antibody positive   . Skin changes related to chemotherapy    per patient she has scabbed over skin circumventing port to right upper chest ;  state it is due to chemptherapy   . 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 APPLICATION OF A-CELL OF BACK Right 07/31/2016   Procedure: CELLERATE COLLAGEN PLACEMENT;  Surgeon: Loel Lofty Dillingham, DO;  Location: [place];  Service: Plastics;  Laterality: Right;  . APPLICATION OF WOUND VAC Right 06/29/2017   Procedure: APPLICATION OF WOUND VAC;  Surgeon: Wallace Going, DO;  Location: [facility];  Service: Plastics;  Laterality: Right;  . DEBRIDEMENT CHEST WALL RIGHT/ VAC PLACEMENT  02-09-2015    [facility]  . EXCISIONAL TOTAL KNEE ARTHROPLASTY WITH ANTIBIOTIC SPACERS Right 12/02/2016   Procedure: EXCISIONAL TOTAL  KNEE ARTHROPLASTY WITH ANTIBIOTIC SPACERS;  Surgeon: Paralee Cancel, MD;  Location: [facility];  Service: Orthopedics;  Laterality: Right;  90 mins  . HEMATOMA EVACUATION Right 06/29/2017   Procedure: EVACUATION HEMATOMA OF RIGHT KNEE;  Surgeon: Wallace Going, DO;  Location: [facility];  Service: Plastics;  Laterality: Right;  . HEMATOMA EVACUATION Right 06/29/2017   Procedure: EVACUATION RIGHT TOTAL KNEE HEMATOMA;  Surgeon: Paralee Cancel, MD;  Location: [facility];  Service: Orthopedics;  Laterality: Right;  . HEMATOMA EVACUATION Right 07/14/2017   Procedure: EVACUATION RIGHT LEG HEMATOMA WITH APPLICATION OF WOUND VAC;  Surgeon: Paralee Cancel, MD;  [facility]: [facility];  Service: Orthopedics;  Laterality: Right;  . hemotoma     evacuation left chest wall  . I&D EXTREMITY Right 07/17/2017   Procedure: EVACUATION RIGHT LEG HEMATOMA WITH WOUND VAC DRESSING CHANGE;  Surgeon: Paralee Cancel, MD;  [facility]: [facility];  Service: Orthopedics;  Laterality: Right;  . I&D KNEE WITH POLY EXCHANGE Right 05/28/2016   Procedure: IRRIGATION AND DEBRIDEMENT KNEE WOUND VAC PLACMENT;  Surgeon: Susa Day, MD;  Location: [facility];  Service: Orthopedics;  Laterality: Right;  . I&D KNEE WITH POLY EXCHANGE Right 05/30/2016   Procedure: RADICAL SYNOVECTOMY,IRRIGATION AND DEBRIDEMENT KNEE WITH POLY EXCHANGE WITH ANTIBIOTIC BEADS, APPLICATION OF WOUND VAC;  Surgeon: Susa Day, MD;  [facility];  Service: Orthopedics;  Laterality: Right;  . INCISION AND DRAINAGE OF WOUND Right 07/31/2016   Procedure: IRRIGATION AND DEBRIDEMENT RIGHT KNEE  WOUND;  Surgeon: Loel Lofty Dillingham, DO;  Location: [place];  Service: Plastics;  Laterality: Right;  . IR FLUORO GUIDE CV LINE LEFT  04/30/2017  . IR US GUIDE VASC ACCESS LEFT  04/30/2017  . IRRIGATION AND DEBRIDEMENT KNEE Right 04/12/2016   Procedure: IRRIGATION AND DEBRIDEMENT KNEE;  Surgeon: Nicholes Stairs, MD;  [facility];  Service: Orthopedics;  Laterality: Right;  . IRRIGATION AND DEBRIDEMENT KNEE Right 12/16/2016   Procedure: Repeat irrigation and debridement right knee, wound closure  wound vac REPLACEMENT ANTIBIOTIC SPACERS;  Surgeon: Paralee Cancel, MD;  Location[facility];  Service: Orthopedics;  Laterality: Right;  . KNEE ARTHROSCOPY  02/13/2012   Procedure: ARTHROSCOPY KNEE;  Surgeon: Johnn Hai, MD;  Location[facility];  Service: Orthopedics;  Laterality: Left;  WITH DEBRIDEMENt   . KNEE ARTHROSCOPY WITH LATERAL MENISECTOMY  02/13/2012   Procedure: KNEE ARTHROSCOPY WITH LATERAL MENISECTOMY;  Surgeon: Johnn Hai, MD;  Location: [facility];  Service: Orthopedics;;   partial  . LATISSIMUS FLAP RIGHT CHEST  02-23-2015   [facility]  . LEFT MODIFIED RADICAL MASTECTOMY/ RIGHT TOTAL MASTECTOMY  11-13-2007   LEFT BREAST CANCER W/ AXILLARY LYMPH NODE METASTASIS AND POST NEOADJUVANT CHEMO  . MASS EXCISION Right 06/29/2017   Procedure: EXCISION OF METASTATIC BREAST CANCER TO SKIN RIGHT SHOULDER, PLACEMENT OF CELLERATE;  Surgeon: Wallace Going, DO;  Location: [facility];  Service: Plastics;  Laterality: Right;  . PLACEMENT PORT-A-CATH  06/22/2007    new port placed 2015, right chest  . REIMPLANTATION OF TOTAL KNEE Right 04/27/2017   Procedure: Reimplantation of right total knee arthroplasty;  Surgeon: Paralee Cancel, MD;  Location: [facility];  Service: Orthopedics;  Laterality: Right;  Adductor Block  . RESECTION OF ISOLATED CHEST WALL DISEASE/ ABDOMINAL SKIN FLAP  02/ 2015     [facility]   RIGHT CHEST WALL METASTATIC SKIN CARCINOMA  . SKIN SPLIT GRAFT Right 01/29/2017   Procedure: SKIN GRAFT SPLIT THICKNESS TO RIGHT KNEE WOUND;  Surgeon:  Dillingham, Loel Lofty, DO;  Location: [facility];  Service: Plastics;  Laterality: Right;  . SKIN SPLIT GRAFT Right 09/07/2017   Procedure: SKIN GRAFT SPLIT THICKNESS TO RIGHT KNEE WOUND WITH PLACEMENT OF VAC DRESSING TO GRAFT SITE;  Surgeon: Wallace Going, DO;  Location: [place];  Service: Plastics;  Laterality: Right;  . TOTAL KNEE ARTHROPLASTY Left 09/23/2012   Procedure: LEFT TOTAL KNEE ARTHROPLASTY;  Surgeon: Johnn Hai, MD;  Location: [facility];  Service: Orthopedics;  Laterality: Left;  . TOTAL KNEE ARTHROPLASTY Right 03/20/2016   Procedure: RIGHT TOTAL KNEE ARTHROPLASTY;  Surgeon: Susa Day, MD;  Location[facility];  Service: Orthopedics;  Laterality: Right;  . TRANSTHORACIC ECHOCARDIOGRAM  04/24/2017   ef 35-36%, grade 1 diastolic dysfuction/  mild MR with mild prolapse anterior leaflet/ mild TR/ PASP 24mmHg    There were no vitals filed for this visit.  Subjective Assessment - 11/02/17 1140    Subjective  Feels  like a tight band across my knee.    Limitations  Standing;Walking    Currently in Pain?  Yes    Pain Score  3     Pain Location  Knee    Pain Orientation  Right    Pain Descriptors / Indicators  Tightness;Sore    Aggravating Factors   Gets stiff with a lot of sitting    Pain Relieving Factors  ice    Multiple Pain Sites  No                       [facility] Adult PT Treatment/Exercise - 11/02/17 0001      Knee/Hip Exercises: Aerobic   Nustep  L3 x 15 min PTA present for status update and monitoring      Knee/Hip Exercises: Standing   Hip Abduction  Both;20 reps;Knee straight    Abduction Limitations  3# added    Hip Extension  Both;20 reps;Knee straight    Extension Limitations  3# added      Knee/Hip Exercises: Seated   Long Arc Quad  Both;20 reps    Long Arc Quad Weight  4 lbs.    Ball Squeeze  25x     Marching  Both;20 reps;Weights    Marching Weights  -- 4# on RT, 3# on LT    Hamstring Curl  Strengthening;Both;2 sets;10 reps;Weights                  PT Long Term Goals - 10/22/17 0913      PT LONG TERM GOAL #1   Title  independent with HEP    Time  8    Period  Weeks    Status  On-going      PT LONG TERM GOAL #2   Title  demonstrate improved functional strength by performing 5x STS without UE support in < 16 sec    Time  8    Period  Weeks    Status  On-going      PT LONG TERM GOAL #3   Title  improve functional endurance by performing 3MWT with at least 700'     Time  8    Period  Weeks    Status  On-going      PT LONG TERM GOAL #4   Title  FOTO score improved to </= 40% limitation for improved function    Time  8    Period  Weeks    Status  On-going  PT LONG TERM GOAL #5   Title  report pain < 4/10 with activity for improved mobility and function    Time  8    Period  Weeks    Status  On-going      Additional Long Term Goals   Additional Long Term Goals  Yes      PT LONG TERM GOAL #6   Title  The patient will have the  stamina  to walk in the grocery store 10-15 minutes.    Time  6    Period  Weeks    Status  New            Plan - 11/02/17 1139    Clinical Impression Statement  Pt reports current resistance still challening to her legs for standing exercises. She was able to increase her LAQ resistance today.  Pt came a little early so she was able to work on her endurance  by going longer on the Nustep today.      Rehab Potential  Good    PT Frequency  2x / week    PT Duration  6 weeks    PT Treatment/Interventions  ADLs/Self Care Home Management;Cryotherapy;Moist Heat;Therapeutic exercise;Therapeutic activities;Functional mobility training;Stair training;Gait training;Balance training;Neuromuscular re-education;Patient/family education;Manual techniques;Vasopneumatic Device;Taping;Passive range of motion    PT Next Visit Plan  advance exercise as able, Rt knee strength, endurance and flexibility    PT Home Exercise Plan  Access Code: [email or web address]     Consulted and Agree with Plan of Care  Patient       Patient will benefit from skilled therapeutic intervention in order to improve the following deficits and impairments:  Abnormal gait, Pain, Decreased strength, Decreased activity tolerance, Decreased balance, Decreased mobility, Decreased range of motion  Visit Diagnosis: Acute pain of right knee  Stiffness of right knee, not elsewhere classified  Muscle weakness (generalized)  Other abnormalities of gait and mobility  Localized edema     Problem List Patient Active Problem List   Diagnosis Date Noted  . Wound, open with complication 03/22/2535  . Fever   . Anemia due to chemotherapy 05/07/2017  . Abnormal LFTs 05/07/2017  . History of breast cancer   . Bilateral leg edema   . Enterococcal infection   . S/P revision of total knee, right 04/27/2017  . Pyogenic arthritis of right knee joint ([place])   . Acute blood loss anemia 12/12/2016  . Right knee abx spacer 12/02/2016  . Physical  exam 07/31/2016  . Allergic reaction 07/24/2016  . Drug rash 07/24/2016  . S/P total knee arthroplasty, right 05/31/2016  . 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 Nonhealing surgical wound 05/28/2016  . Sepsis ([place]) 05/10/2016  . UTI (urinary tract infection) 05/10/2016  . Cellulitis of right knee 05/10/2016  . Blood clot in vein   . Wound dehiscence, surgical 04/12/2016  . 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 Vitamin D deficiency 01/25/2016  . 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 Primary osteoarthritis of right knee 02/01/2015  . Symptomatic anemia 02/01/2015  . Frequent UTI 01/12/2014  . Sinus tachycardia 11/03/2013  . SOB (shortness of breath) 11/03/2013  . Postoperative anemia due to acute blood loss 12/24/2012  .    09/23/2012  . Sinusitis 07/28/2012  . Neuropathy due to chemotherapeutic drug ([place]) 07/25/2011  . 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 Metastatic breast cancer ([place]) 11/12/2010  . Chronic anticoagulation 11/12/2010  . Anxiety, generalized 03/26/2008    Jalaiya Oyster, PTA 11/02/2017, 12:11 PM  [facility] [address] [place], [place], [address] Phone: [phone]   Fax:  [phone]  Name: ALYSSON GEIST MRN: [id number] Date of Birth: 11/16/1955

## 2017-11-04 ENCOUNTER — Ambulatory Visit: Payer: 59 | Admitting: Physical Therapy

## 2017-11-04 DIAGNOSIS — R600 Localized edema: Secondary | ICD-10-CM

## 2017-11-04 DIAGNOSIS — R2689 Other abnormalities of gait and mobility: Secondary | ICD-10-CM

## 2017-11-04 DIAGNOSIS — M6281 Muscle weakness (generalized): Secondary | ICD-10-CM

## 2017-11-04 DIAGNOSIS — M25561 Pain in right knee: Secondary | ICD-10-CM

## 2017-11-04 DIAGNOSIS — M25661 Stiffness of right knee, not elsewhere classified: Secondary | ICD-10-CM

## 2017-11-04 NOTE — Therapy (Signed)
[facility] [address], [place], [place], [address] Phone: [phone]   Fax:  [phone]  Physical Therapy Treatment  Patient Details  Name: [name] MRN: [id number] Date of Birth: 11-Dec-1955 Referring Provider: Dr. Paralee Cancel   Encounter Date: 11/04/2017  PT End of Session - 11/04/17 0849    Visit Number  13    Number of Visits  24    Date for PT Re-Evaluation  12/03/17    Authorization Type  [facility] [employer] (prior auth required for secondary insurance); 24 visits total approved (9 used prior to 09/02/17 visit); will need to request additional visits with review from insurance if needed past 24 visits    Authorization - Visit Number  93    Authorization - Number of Visits  24    PT Start Time  0835    PT Stop Time  0920    PT Time Calculation (min)  45 min    Activity Tolerance  Patient tolerated treatment well    Behavior During Therapy  [facility] for tasks assessed/performed       Past Medical History:  Diagnosis Date  . Arthritis   . 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 Heart murmur   . 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 MVP (mitral valve prolapse)    mild mvp w/  mild regurg. per last echo 04-24-2017  . Neuropathy due to chemotherapeutic drug ([place])   . Non-healing surgical wound    right knee post re-implantation total knee arthroplasty 04-2017 unable to completely close surgical incision  . PONV (postoperative nausea and vomiting)    ponv likes scopolamine patch  . Port-A-Cath in place    RIGHT CHEST   . Red blood cell antibody positive   . Skin changes related to chemotherapy    per patient she has scabbed over skin circumventing port to right upper chest ;  state it is due to chemptherapy   . 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 APPLICATION OF A-CELL OF BACK Right 07/31/2016   Procedure: CELLERATE COLLAGEN PLACEMENT;  Surgeon: Loel Lofty Dillingham, DO;  Location: [place];  Service: Plastics;  Laterality: Right;  . APPLICATION OF WOUND VAC Right 06/29/2017   Procedure: APPLICATION OF WOUND VAC;  Surgeon: Wallace Going, DO;  Location: [facility];  Service: Plastics;  Laterality: Right;  . DEBRIDEMENT CHEST WALL RIGHT/ VAC PLACEMENT  02-09-2015    [facility]  . EXCISIONAL TOTAL KNEE ARTHROPLASTY WITH ANTIBIOTIC SPACERS Right 12/02/2016   Procedure: EXCISIONAL TOTAL  KNEE ARTHROPLASTY WITH ANTIBIOTIC SPACERS;  Surgeon: Paralee Cancel, MD;  Location: [facility];  Service: Orthopedics;  Laterality: Right;  90 mins  . HEMATOMA EVACUATION Right 06/29/2017   Procedure: EVACUATION HEMATOMA OF RIGHT KNEE;  Surgeon: Wallace Going, DO;  Location: [facility];  Service: Plastics;  Laterality: Right;  . HEMATOMA EVACUATION Right 06/29/2017   Procedure: EVACUATION RIGHT TOTAL KNEE HEMATOMA;  Surgeon: Paralee Cancel, MD;  [facility];  Service: Orthopedics;  Laterality: Right;  . HEMATOMA EVACUATION Right 07/14/2017   Procedure: EVACUATION RIGHT LEG HEMATOMA WITH APPLICATION OF WOUND VAC;  Surgeon: Paralee Cancel, MD;  [facility];  Service: Orthopedics;  Laterality: Right;  . hemotoma     evacuation left chest wall  . I&D EXTREMITY Right 07/17/2017   Procedure: EVACUATION RIGHT LEG HEMATOMA WITH WOUND VAC DRESSING CHANGE;  Surgeon: Paralee Cancel, MD;  [facility];  Service: Orthopedics;  Laterality: Right;  . I&D KNEE WITH POLY EXCHANGE Right 05/28/2016   Procedure: IRRIGATION AND DEBRIDEMENT KNEE WOUND VAC PLACMENT;  Surgeon: Susa Day, MD;  [facility];  Service: Orthopedics;  Laterality: Right;  . I&D KNEE WITH POLY EXCHANGE Right 05/30/2016   Procedure: RADICAL SYNOVECTOMY,IRRIGATION AND DEBRIDEMENT KNEE WITH POLY EXCHANGE WITH ANTIBIOTIC BEADS, APPLICATION OF WOUND VAC;  Surgeon: Susa Day, MD;  [facility];  Service: Orthopedics;  Laterality: Right;  . INCISION AND DRAINAGE OF WOUND Right 07/31/2016   Procedure: IRRIGATION AND DEBRIDEMENT RIGHT KNEE  WOUND;  Surgeon: Loel Lofty Dillingham, DO;  Location: [place];  Service: Plastics;  Laterality: Right;  . IR FLUORO GUIDE CV LINE LEFT  04/30/2017  . IR US GUIDE VASC ACCESS LEFT  04/30/2017  . IRRIGATION AND DEBRIDEMENT KNEE Right 04/12/2016   Procedure: IRRIGATION AND DEBRIDEMENT KNEE;  Surgeon: Nicholes Stairs, MD;  [facility];  Service: Orthopedics;  Laterality: Right;  . IRRIGATION AND DEBRIDEMENT KNEE Right 12/16/2016   Procedure: Repeat irrigation and debridement right knee, wound closure  wound vac REPLACEMENT ANTIBIOTIC SPACERS;  Surgeon: Paralee Cancel, MD;  [facility];  Service: Orthopedics;  Laterality: Right;  . KNEE ARTHROSCOPY  02/13/2012   Procedure: ARTHROSCOPY KNEE;  Surgeon: Johnn Hai, MD;  Location[facility];  Service: Orthopedics;  Laterality: Left;  WITH DEBRIDEMENt   . KNEE ARTHROSCOPY WITH LATERAL MENISECTOMY  02/13/2012   Procedure: KNEE ARTHROSCOPY WITH LATERAL MENISECTOMY;  Surgeon: Johnn Hai, MD;  Location: [facility];  Service: Orthopedics;;   partial  . LATISSIMUS FLAP RIGHT CHEST  02-23-2015   [facility]  . LEFT MODIFIED RADICAL MASTECTOMY/ RIGHT TOTAL MASTECTOMY  11-13-2007   LEFT BREAST CANCER W/ AXILLARY LYMPH NODE METASTASIS AND POST NEOADJUVANT CHEMO  . MASS EXCISION Right 06/29/2017   Procedure: EXCISION OF METASTATIC BREAST CANCER TO SKIN RIGHT SHOULDER, PLACEMENT OF CELLERATE;  Surgeon: Wallace Going, DO;  [facility];  Service: Plastics;  Laterality: Right;  . PLACEMENT PORT-A-CATH  06/22/2007    new port placed 2015, right chest  . REIMPLANTATION OF TOTAL KNEE Right 04/27/2017   Procedure: Reimplantation of right total knee arthroplasty;  Surgeon: Paralee Cancel, MD;  [facility];  Service: Orthopedics;  Laterality: Right;  Adductor Block  . RESECTION OF ISOLATED CHEST WALL DISEASE/ ABDOMINAL SKIN FLAP  02/ 2015     [facility]   RIGHT CHEST WALL METASTATIC SKIN CARCINOMA  . SKIN SPLIT GRAFT Right 01/29/2017   Procedure: SKIN GRAFT SPLIT THICKNESS TO RIGHT KNEE WOUND;  Surgeon:  Dillingham, Loel Lofty, DO;  Location: [facility];  Service: Plastics;  Laterality: Right;  . SKIN SPLIT GRAFT Right 09/07/2017   Procedure: SKIN GRAFT SPLIT THICKNESS TO RIGHT KNEE WOUND WITH PLACEMENT OF VAC DRESSING TO GRAFT SITE;  Surgeon: Wallace Going, DO;  Location: [place];  Service: Plastics;  Laterality: Right;  . TOTAL KNEE ARTHROPLASTY Left 09/23/2012   Procedure: LEFT TOTAL KNEE ARTHROPLASTY;  Surgeon: Johnn Hai, MD;  [facility];  Service: Orthopedics;  Laterality: Left;  . TOTAL KNEE ARTHROPLASTY Right 03/20/2016   Procedure: RIGHT TOTAL KNEE ARTHROPLASTY;  Surgeon: Susa Day, MD;  [facility];  Service: Orthopedics;  Laterality: Right;  . TRANSTHORACIC ECHOCARDIOGRAM  04/24/2017   ef 58-09%, grade 1 diastolic dysfuction/  mild MR with mild prolapse anterior leaflet/ mild TR/ PASP 95mHg    There were no vitals filed for this visit.      [facility] - 11/04/17 0001      AROM    Right Knee Flexion  113      Strength   Right Knee Extension  4/5                   [facility] Adult PT Treatment/Exercise - 11/04/17 0001      Knee/Hip Exercises: Standing   Hip Abduction  Both;20 reps;Knee straight    Abduction Limitations  3# added    Hip Extension  Both;20 reps;Knee straight    Extension Limitations  3# added    Rebounder  weight shifting 3 ways- 1 minute each      Knee/Hip Exercises: Seated   Long Arc Quad  Both;20 reps    Long Arc Quad Weight  4 lbs.    Ball Squeeze  25x     Marching  Both;20 reps;Weights    Marching Weights  -- 4# on RT, 3# on LT    Hamstring Curl  Strengthening;Both;2 sets;10 reps;Weights 3#    Sit to Sand  -- 2x 10 sitting on 2 balck pads               PT Short Term Goals - 11/04/17 0909      PT SHORT TERM GOAL #2   Title  Pt will be able to complete atleast 20 repetitions of SLR without extension lag, which will improve her safety with ambulation and other activity.     Time  3    Period  Weeks    Status  Achieved Can do 2x10      PT SHORT TERM GOAL #3   Title  Pt will demo Lt knee AROM flexion to atlast 100 deg which will help with technique during sit to stand.     Time  3    Period  Weeks    Status  Achieved 113 degrees      PT SHORT TERM GOAL #5   Title  Pt will demo proper mechanics with sit to stand, without significant weight shift Lt and no more than 1 UE support for safety.     Time  3    Period  Weeks    Status  Achieved        PT Long Term Goals - 11/04/17 09833     PT LONG TERM GOAL #5   Title  report pain < 4/10 with activity for improved mobility and function    Time  8    Period  Weeks    Status  Achieved No >  3/10      PT LONG TERM GOAL #6   Title  The patient will have the stamina  to walk in the grocery store 10-15 minutes.    Time  6    Period  Weeks    Status  Achieved Can do and is her focus to do more of this as part of her HEP            Plan - 11/04/17 0850     Clinical Impression Statement  Pt is bending her knee > 100 degrees, meeting short term goal. She demonstrated ease and speed with her sit to stand today sitting on 2 black pads to accomodate for her height. She continues to tolerate the ankle weights for her LE strength and reported today she is thinking about going to the gym to continue exercising after her time in PT.  She has met all short term goals and is working towards the remaining goals.  Her average knee pain during the day is 4/10, meeting this long term goal.     Rehab Potential  Good    PT Frequency  2x / week    PT Duration  6 weeks    PT Treatment/Interventions  ADLs/Self Care Home Management;Cryotherapy;Moist Heat;Therapeutic exercise;Therapeutic activities;Functional mobility training;Stair training;Gait training;Balance training;Neuromuscular re-education;Patient/family education;Manual techniques;Vasopneumatic Device;Taping;Passive range of motion    PT Next Visit Plan  Discuss DC vs renewal next session.  3 min walk test next for goals.     PT Home Exercise Plan  Access Code: [email or web address]     Consulted and Agree with Plan of Care  Patient       Patient will benefit from skilled therapeutic intervention in order to improve the following deficits and impairments:  Abnormal gait, Pain, Decreased strength, Decreased activity tolerance, Decreased balance, Decreased mobility, Decreased range of motion  Visit Diagnosis: Acute pain of right knee  Stiffness of right knee, not elsewhere classified  Muscle weakness (generalized)  Other abnormalities of gait and mobility  Localized edema     Problem List Patient Active Problem List   Diagnosis Date Noted  . Wound, open with complication 86/57/8469  . Fever   . Anemia due to chemotherapy 05/07/2017  . Abnormal LFTs 05/07/2017  . History of breast cancer   . Bilateral leg edema   . Enterococcal infection   . S/P revision of total knee, right 04/27/2017  . Pyogenic arthritis  of right knee joint ([place])   . Acute blood loss anemia 12/12/2016  . Right knee abx spacer 12/02/2016  . Physical exam 07/31/2016  . Allergic reaction 07/24/2016  . Drug rash 07/24/2016  . S/P total knee arthroplasty, right 05/31/2016  . 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 Nonhealing surgical wound 05/28/2016  . Sepsis ([place]) 05/10/2016  . UTI (urinary tract infection) 05/10/2016  . Cellulitis of right knee 05/10/2016  . Blood clot in vein   . Wound dehiscence, surgical 04/12/2016  . 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 Vitamin D deficiency 01/25/2016  . 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 Primary osteoarthritis of right knee 02/01/2015  . Symptomatic anemia 02/01/2015  . Frequent UTI 01/12/2014  . Sinus tachycardia 11/03/2013  . SOB (shortness of breath) 11/03/2013  . Postoperative anemia due to acute blood loss 12/24/2012  .    09/23/2012  . Sinusitis 07/28/2012  . 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 Metastatic breast cancer ([place]) 11/12/2010  . Chronic anticoagulation  11/12/2010  . Anxiety, generalized 03/26/2008    Emilia Kayes, PTA 11/04/2017, 9:26 AM  [facility] [address], [place], [place], [address] Phone: [phone]   Fax:  [phone]  Name: TYLIAH SCHLERETH MRN: [id number] Date of Birth: 1956/04/09

## 2017-11-06 ENCOUNTER — Other Ambulatory Visit: Payer: Self-pay | Admitting: Family Medicine

## 2017-11-06 MED FILL — ONDANSETRON HCL 8 MG TABLET: 8 | 7 days supply | Qty: 20 | Fill #0

## 2017-11-09 ENCOUNTER — Encounter: Payer: Self-pay | Admitting: Physical Therapy

## 2017-11-09 ENCOUNTER — Ambulatory Visit: Payer: 59 | Admitting: Physical Therapy

## 2017-11-09 DIAGNOSIS — R2689 Other abnormalities of gait and mobility: Secondary | ICD-10-CM

## 2017-11-09 DIAGNOSIS — M25661 Stiffness of right knee, not elsewhere classified: Secondary | ICD-10-CM | POA: Diagnosis not present

## 2017-11-09 DIAGNOSIS — R600 Localized edema: Secondary | ICD-10-CM | POA: Diagnosis not present

## 2017-11-09 DIAGNOSIS — M6281 Muscle weakness (generalized): Secondary | ICD-10-CM

## 2017-11-09 DIAGNOSIS — M25561 Pain in right knee: Secondary | ICD-10-CM | POA: Diagnosis not present

## 2017-11-09 NOTE — Therapy (Signed)
[facility] [address] [place], [place], [address] Phone: [phone]   Fax:  [phone]  Physical Therapy Treatment  Patient Details  Name: [name] MRN: [id number] Date of Birth: 1955-09-05 Referring Provider: Dr. Paralee Cancel   Encounter Date: 11/09/2017  PT End of Session - 11/09/17 0932    Visit Number  14    Number of Visits  24    Date for PT Re-Evaluation  12/03/17    Authorization Type  [facility] [employer] (prior auth required for secondary insurance); 24 visits total approved (9 used prior to 09/02/17 visit); will need to request additional visits with review from insurance if needed past 24 visits    Authorization - Visit Number  23    Authorization - Number of Visits  24    PT Start Time  0920    PT Stop Time  1010    PT Time Calculation (min)  50 min    Activity Tolerance  Patient tolerated treatment well    Behavior During Therapy  [facility] for tasks assessed/performed       Past Medical History:  Diagnosis Date  . Arthritis   . 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 Heart murmur   . 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 MVP (mitral valve prolapse)    mild mvp w/  mild regurg. per last echo 04-24-2017  . Neuropathy due to chemotherapeutic drug ([place])   . Non-healing surgical wound    right knee post re-implantation total knee arthroplasty 04-2017 unable to completely close surgical incision  . PONV (postoperative nausea and vomiting)    ponv likes scopolamine patch  . Port-A-Cath in place    RIGHT CHEST   . Red blood cell antibody positive   . Skin changes related to chemotherapy    per patient she has scabbed over skin circumventing port to right upper chest ;  state it is due to chemptherapy   . 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 APPLICATION OF A-CELL OF BACK Right 07/31/2016   Procedure: CELLERATE COLLAGEN PLACEMENT;  Surgeon: Loel Lofty Dillingham, DO;  Location: [place];  Service: Plastics;  Laterality: Right;  . APPLICATION OF WOUND VAC Right 06/29/2017   Procedure: APPLICATION OF WOUND VAC;  Surgeon: Wallace Going, DO;  Location: [facility];  Service: Plastics;  Laterality: Right;  . DEBRIDEMENT CHEST WALL RIGHT/ VAC PLACEMENT  02-09-2015    [facility]  . EXCISIONAL TOTAL KNEE ARTHROPLASTY WITH ANTIBIOTIC SPACERS Right 12/02/2016   Procedure: EXCISIONAL TOTAL  KNEE ARTHROPLASTY WITH ANTIBIOTIC SPACERS;  Surgeon: Paralee Cancel, MD;  Location: [facility];  Service: Orthopedics;  Laterality: Right;  90 mins  . HEMATOMA EVACUATION Right 06/29/2017   Procedure: EVACUATION HEMATOMA OF RIGHT KNEE;  Surgeon: Wallace Going, DO;  Location: [facility];  Service: Plastics;  Laterality: Right;  . HEMATOMA EVACUATION Right 06/29/2017   Procedure: EVACUATION RIGHT TOTAL KNEE HEMATOMA;  Surgeon: Paralee Cancel, MD;  [facility];  Service: Orthopedics;  Laterality: Right;  . HEMATOMA EVACUATION Right 07/14/2017   Procedure: EVACUATION RIGHT LEG HEMATOMA WITH APPLICATION OF WOUND VAC;  Surgeon: Paralee Cancel, MD;  Location: [facility];  Service: Orthopedics;  Laterality: Right;  . hemotoma     evacuation left chest wall  . I&D EXTREMITY Right 07/17/2017   Procedure: EVACUATION RIGHT LEG HEMATOMA WITH WOUND VAC DRESSING CHANGE;  Surgeon: Paralee Cancel, MD;  [facility]: [facility];  Service: Orthopedics;  Laterality: Right;  . I&D KNEE WITH POLY EXCHANGE Right 05/28/2016   Procedure: IRRIGATION AND DEBRIDEMENT KNEE WOUND VAC PLACMENT;  Surgeon: Susa Day, MD;  Location: [facility];  Service: Orthopedics;  Laterality: Right;  . I&D KNEE WITH POLY EXCHANGE Right 05/30/2016   Procedure: RADICAL SYNOVECTOMY,IRRIGATION AND DEBRIDEMENT KNEE WITH POLY EXCHANGE WITH ANTIBIOTIC BEADS, APPLICATION OF WOUND VAC;  Surgeon: Susa Day, MD;  [facility];  Service: Orthopedics;  Laterality: Right;  . INCISION AND DRAINAGE OF WOUND Right 07/31/2016   Procedure: IRRIGATION AND DEBRIDEMENT RIGHT KNEE  WOUND;  Surgeon: Loel Lofty Dillingham, DO;  Location: [place];  Service: Plastics;  Laterality: Right;  . IR FLUORO GUIDE CV LINE LEFT  04/30/2017  . IR US GUIDE VASC ACCESS LEFT  04/30/2017  . IRRIGATION AND DEBRIDEMENT KNEE Right 04/12/2016   Procedure: IRRIGATION AND DEBRIDEMENT KNEE;  Surgeon: Nicholes Stairs, MD;  Location: [facility];  Service: Orthopedics;  Laterality: Right;  . IRRIGATION AND DEBRIDEMENT KNEE Right 12/16/2016   Procedure: Repeat irrigation and debridement right knee, wound closure  wound vac REPLACEMENT ANTIBIOTIC SPACERS;  Surgeon: Paralee Cancel, MD;  Location[facility];  Service: Orthopedics;  Laterality: Right;  . KNEE ARTHROSCOPY  02/13/2012   Procedure: ARTHROSCOPY KNEE;  Surgeon: Johnn Hai, MD;  Location[facility];  Service: Orthopedics;  Laterality: Left;  WITH DEBRIDEMENt   . KNEE ARTHROSCOPY WITH LATERAL MENISECTOMY  02/13/2012   Procedure: KNEE ARTHROSCOPY WITH LATERAL MENISECTOMY;  Surgeon: Johnn Hai, MD;  Location: [facility];  Service: Orthopedics;;   partial  . LATISSIMUS FLAP RIGHT CHEST  02-23-2015   [facility]  . LEFT MODIFIED RADICAL MASTECTOMY/ RIGHT TOTAL MASTECTOMY  11-13-2007   LEFT BREAST CANCER W/ AXILLARY LYMPH NODE METASTASIS AND POST NEOADJUVANT CHEMO  . MASS EXCISION Right 06/29/2017   Procedure: EXCISION OF METASTATIC BREAST CANCER TO SKIN RIGHT SHOULDER, PLACEMENT OF CELLERATE;  Surgeon: Wallace Going, DO;  Location: [facility];  Service: Plastics;  Laterality: Right;  . PLACEMENT PORT-A-CATH  06/22/2007    new port placed 2015, right chest  . REIMPLANTATION OF TOTAL KNEE Right 04/27/2017   Procedure: Reimplantation of right total knee arthroplasty;  Surgeon: Paralee Cancel, MD;  Location: [facility];  Service: Orthopedics;  Laterality: Right;  Adductor Block  . RESECTION OF ISOLATED CHEST WALL DISEASE/ ABDOMINAL SKIN FLAP  02/ 2015     [facility]   RIGHT CHEST WALL METASTATIC SKIN CARCINOMA  . SKIN SPLIT GRAFT Right 01/29/2017   Procedure: SKIN GRAFT SPLIT THICKNESS TO RIGHT KNEE WOUND;  Surgeon:  Dillingham, Loel Lofty, DO;  Location: [facility];  Service: Plastics;  Laterality: Right;  . SKIN SPLIT GRAFT Right 09/07/2017   Procedure: SKIN GRAFT SPLIT THICKNESS TO RIGHT KNEE WOUND WITH PLACEMENT OF VAC DRESSING TO GRAFT SITE;  Surgeon: Wallace Going, DO;  Location: [place];  Service: Plastics;  Laterality: Right;  . TOTAL KNEE ARTHROPLASTY Left 09/23/2012   Procedure: LEFT TOTAL KNEE ARTHROPLASTY;  Surgeon: Johnn Hai, MD;  Location: [facility];  Service: Orthopedics;  Laterality: Left;  . TOTAL KNEE ARTHROPLASTY Right 03/20/2016   Procedure: RIGHT TOTAL KNEE ARTHROPLASTY;  Surgeon: Susa Day, MD;  Location: [facility];  Service: Orthopedics;  Laterality: Right;  . TRANSTHORACIC ECHOCARDIOGRAM  04/24/2017   ef 86-76%, grade 1 diastolic dysfuction/  mild MR with mild prolapse anterior leaflet/ mild TR/ PASP 1mHg    There were no vitals filed for this visit.  Subjective Assessment - 11/09/17 0934    Subjective  Still  feels like a tight band across my knee, but not bad this AM    Currently in Pain?  Yes    Pain Score  3     Pain Location  Knee    Pain Orientation  Right    Pain Descriptors / Indicators  Tightness    Multiple Pain Sites  No                       [facility] Adult PT Treatment/Exercise - 11/09/17 0001      Knee/Hip Exercises: Standing   Forward Step Up  Both;2 sets;5 reps;Hand Hold: 2;Step Height: 6"    Rebounder  weight shifting 3 ways- 1 minute each      Knee/Hip Exercises: Seated   Long Arc Quad  Strengthening;Both;2 sets;15 reps;Weights    Long Arc Quad Weight  4 lbs.    Ball Squeeze  30x    Marching  Strengthening;Both;2 sets;15 reps;Weights 4#    Hamstring Curl  Strengthening;Both;2 sets;15 reps;Weights     Hamstring Weights  4 lbs.    Sit to Sand  -- 5x in 14 sec, descent was uncontrolled               PT Short Term Goals - 11/04/17 0909      PT SHORT TERM GOAL #2   Title  Pt will be able to complete atleast 20 repetitions of SLR without extension lag, which will improve her safety with ambulation and other activity.     Time  3    Period  Weeks    Status  Achieved Can do 2x10      PT SHORT TERM GOAL #3   Title  Pt will demo Lt knee AROM flexion to atlast 100 deg which will help with technique during sit to stand.     Time  3    Period  Weeks    Status  Achieved 113 degrees      PT SHORT TERM GOAL #5   Title  Pt will demo proper mechanics with sit to stand, without significant weight shift Lt and no more than 1 UE support for safety.     Time  3    Period  Weeks    Status  Achieved        PT Long Term Goals - 11/09/17 0954      PT LONG TERM GOAL #2   Title  demonstrate improved functional strength by performing 5x STS without UE support in < 16 sec  Time  8    Period  Weeks    Status  Partially Met Pt perfromed in 14 sec but the descent was uncontrolled.             Plan - 11/09/17 0946    Clinical Impression Statement  Pt  feeling pretty good this AM, main complaint is the tight band around her knee. This band, she reports, feels less tight. She has walked more purposefully to help the tightness. This does make the knee sore but after icing it it typically feels better.  Worked primarily on muscular endurance by performing more reps with the same resistance.     Rehab Potential  Good    PT Frequency  2x / week    PT Duration  6 weeks    PT Treatment/Interventions  ADLs/Self Care Home Management;Cryotherapy;Moist Heat;Therapeutic exercise;Therapeutic activities;Functional mobility training;Stair training;Gait training;Balance training;Neuromuscular re-education;Patient/family education;Manual techniques;Vasopneumatic Device;Taping;Passive range of motion    PT Next Visit Plan  Pt appears to have 1 more visit left, she will cancel this Wednesday and keep her last visit for next week with PT to discuss need for further visits.     PT Home Exercise Plan  Access Code: [email or web address]     Consulted and Agree with Plan of Care  Patient       Patient will benefit from skilled therapeutic intervention in order to improve the following deficits and impairments:  Abnormal gait, Pain, Decreased strength, Decreased activity tolerance, Decreased balance, Decreased mobility, Decreased range of motion  Visit Diagnosis: Acute pain of right knee  Stiffness of right knee, not elsewhere classified  Muscle weakness (generalized)  Other abnormalities of gait and mobility  Localized edema     Problem List Patient Active Problem List   Diagnosis Date Noted  . Wound, open with complication 79/06/4095  . Fever   . Anemia due to chemotherapy 05/07/2017  . Abnormal LFTs 05/07/2017  . History of breast cancer   . Bilateral leg edema   . Enterococcal infection   . S/P revision of total knee, right 04/27/2017  . Pyogenic arthritis of right knee joint ([place])   . Acute blood loss anemia 12/12/2016  . Right knee abx spacer 12/02/2016   . Physical exam 07/31/2016  . Allergic reaction 07/24/2016  . Drug rash 07/24/2016  . S/P total knee arthroplasty, right 05/31/2016  . 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 Nonhealing surgical wound 05/28/2016  . Sepsis ([place]) 05/10/2016  . UTI (urinary tract infection) 05/10/2016  . Cellulitis of right knee 05/10/2016  . Blood clot in vein   . Wound dehiscence, surgical 04/12/2016  . 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 Vitamin D deficiency 01/25/2016  . 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 Primary osteoarthritis of right knee 02/01/2015  . Symptomatic anemia 02/01/2015  . Frequent UTI 01/12/2014  . Sinus tachycardia 11/03/2013  . SOB (shortness of breath) 11/03/2013  . Postoperative anemia due to acute blood loss 12/24/2012  .    09/23/2012  . Sinusitis 07/28/2012  . Neuropathy due to chemotherapeutic drug ([place]) 07/25/2011  . 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 Metastatic breast cancer ([place]) 11/12/2010  . Chronic anticoagulation 11/12/2010  . Anxiety, generalized 03/26/2008    Darlisha Kelm, PTA 11/09/2017, 10:18 AM  [facility] [address], [place], [place], [address] Phone: [phone]   Fax:  [phone]  Name: DIMITRA WOODSTOCK MRN: [id number] Date of Birth: Feb 01, 1956

## 2017-11-10 DIAGNOSIS — Z171 Estrogen receptor negative status [ER-]: Secondary | ICD-10-CM | POA: Diagnosis not present

## 2017-11-10 DIAGNOSIS — Z7901 Long term (current) use of anticoagulants: Secondary | ICD-10-CM | POA: Diagnosis not present

## 2017-11-10 DIAGNOSIS — I749 Embolism and thrombosis of unspecified artery: Secondary | ICD-10-CM | POA: Diagnosis not present

## 2017-11-10 DIAGNOSIS — I272 Pulmonary hypertension, unspecified: Secondary | ICD-10-CM | POA: Diagnosis not present

## 2017-11-10 DIAGNOSIS — C779 Secondary and unspecified malignant neoplasm of lymph node, unspecified: Secondary | ICD-10-CM | POA: Diagnosis not present

## 2017-11-10 DIAGNOSIS — C50412 Malignant neoplasm of upper-outer quadrant of left female breast: Secondary | ICD-10-CM | POA: Diagnosis not present

## 2017-11-10 DIAGNOSIS — R197 Diarrhea, unspecified: Secondary | ICD-10-CM | POA: Diagnosis not present

## 2017-11-10 DIAGNOSIS — Z5112 Encounter for antineoplastic immunotherapy: Secondary | ICD-10-CM | POA: Diagnosis not present

## 2017-11-10 DIAGNOSIS — C50912 Malignant neoplasm of unspecified site of left female breast: Secondary | ICD-10-CM | POA: Diagnosis not present

## 2017-11-10 DIAGNOSIS — R400 Somnolence: Secondary | ICD-10-CM | POA: Diagnosis not present

## 2017-11-10 DIAGNOSIS — G629 Polyneuropathy, unspecified: Secondary | ICD-10-CM | POA: Diagnosis not present

## 2017-11-10 DIAGNOSIS — R748 Abnormal levels of other serum enzymes: Secondary | ICD-10-CM | POA: Diagnosis not present

## 2017-11-10 DIAGNOSIS — C792 Secondary malignant neoplasm of skin: Secondary | ICD-10-CM | POA: Diagnosis not present

## 2017-11-11 ENCOUNTER — Encounter: Payer: Self-pay | Admitting: Physical Therapy

## 2017-11-16 ENCOUNTER — Ambulatory Visit: Payer: 59

## 2017-11-16 DIAGNOSIS — R2689 Other abnormalities of gait and mobility: Secondary | ICD-10-CM

## 2017-11-16 DIAGNOSIS — M6281 Muscle weakness (generalized): Secondary | ICD-10-CM | POA: Diagnosis not present

## 2017-11-16 DIAGNOSIS — M25661 Stiffness of right knee, not elsewhere classified: Secondary | ICD-10-CM

## 2017-11-16 DIAGNOSIS — R600 Localized edema: Secondary | ICD-10-CM

## 2017-11-16 DIAGNOSIS — M25561 Pain in right knee: Secondary | ICD-10-CM | POA: Diagnosis not present

## 2017-11-16 NOTE — Therapy (Signed)
[facility] [address] [place], [place], [address] Phone: [phone]   Fax:  [phone]  Physical Therapy Treatment  Patient Details  Name: [name] MRN: [id number] Date of Birth: 1956-01-14 Referring Provider: Dr. Paralee Cancel   Encounter Date: 11/16/2017  PT End of Session - 11/16/17 1054    Visit Number  15    Date for PT Re-Evaluation  12/28/17    Authorization Type  [facility] [employer] (prior auth required for secondary insurance); 24 visits total approved (9 used prior to 09/02/17 visit); will need to request additional visits with review from insurance if needed past 24 visits    Authorization - Visit Number  24    Authorization - Number of Visits  24    PT Start Time  1014    PT Stop Time  1054    PT Time Calculation (min)  40 min    Activity Tolerance  Patient tolerated treatment well    Behavior During Therapy  [facility] for tasks assessed/performed       Past Medical History:  Diagnosis Date  . Arthritis   . 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 Heart murmur   . 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 MVP (mitral valve prolapse)    mild mvp w/ mild regurg. per last echo  04-24-2017  . Neuropathy due to chemotherapeutic drug ([place])   . Non-healing surgical wound    right knee post re-implantation total knee arthroplasty 04-2017 unable to completely close surgical incision  . PONV (postoperative nausea and vomiting)    ponv likes scopolamine patch  . Port-A-Cath in place    RIGHT CHEST   . Red blood cell antibody positive   . Skin changes related to chemotherapy    per patient she has scabbed over skin circumventing port to right upper chest ;  state it is due to chemptherapy   . 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 APPLICATION OF A-CELL OF BACK Right 07/31/2016   Procedure: CELLERATE COLLAGEN PLACEMENT;  Surgeon: Loel Lofty Dillingham, DO;  Location: [place];  Service: Plastics;  Laterality: Right;  . APPLICATION OF WOUND VAC Right 06/29/2017   Procedure: APPLICATION OF WOUND VAC;  Surgeon: Wallace Going, DO;  Location: [facility];  Service: Plastics;  Laterality: Right;  . DEBRIDEMENT CHEST WALL RIGHT/ VAC PLACEMENT  02-09-2015    [facility]  . EXCISIONAL TOTAL KNEE ARTHROPLASTY WITH ANTIBIOTIC SPACERS Right 12/02/2016   Procedure: EXCISIONAL TOTAL KNEE ARTHROPLASTY WITH ANTIBIOTIC SPACERS;  Surgeon: Alvan Dame,  Rodman Key, MD;  Location: [facility];  Service: Orthopedics;  Laterality: Right;  90 mins  . HEMATOMA EVACUATION Right 06/29/2017   Procedure: EVACUATION HEMATOMA OF RIGHT KNEE;  Surgeon: Wallace Going, DO;  [facility];  Service: Plastics;  Laterality: Right;  . HEMATOMA EVACUATION Right 06/29/2017   Procedure: EVACUATION RIGHT TOTAL KNEE HEMATOMA;  Surgeon: Paralee Cancel, MD;  [facility];  Service: Orthopedics;  Laterality: Right;  . HEMATOMA EVACUATION Right 07/14/2017   Procedure: EVACUATION RIGHT LEG HEMATOMA WITH APPLICATION OF WOUND VAC;  Surgeon: Paralee Cancel, MD;  [facility];  Service: Orthopedics;  Laterality: Right;  . hemotoma     evacuation left chest wall  . I&D EXTREMITY Right 07/17/2017   Procedure: EVACUATION RIGHT LEG HEMATOMA WITH WOUND VAC DRESSING CHANGE;  Surgeon: Paralee Cancel, MD;  [facility];  Service: Orthopedics;  Laterality: Right;  . I&D KNEE WITH POLY EXCHANGE Right 05/28/2016   Procedure: IRRIGATION AND DEBRIDEMENT KNEE WOUND VAC PLACMENT;  Surgeon: Susa Day, MD;  [facility];  Service: Orthopedics;  Laterality: Right;  . I&D KNEE WITH POLY EXCHANGE Right 05/30/2016   Procedure: RADICAL SYNOVECTOMY,IRRIGATION AND DEBRIDEMENT KNEE WITH POLY EXCHANGE WITH ANTIBIOTIC BEADS, APPLICATION OF WOUND VAC;  Surgeon: Susa Day, MD;  [facility];  Service: Orthopedics;  Laterality: Right;  . INCISION AND DRAINAGE OF WOUND Right 07/31/2016   Procedure: IRRIGATION AND DEBRIDEMENT RIGHT KNEE  WOUND;  Surgeon: Loel Lofty Dillingham, DO;  Location: [place];  Service: Plastics;  Laterality: Right;  . IR FLUORO GUIDE CV LINE LEFT  04/30/2017  . IR US GUIDE VASC ACCESS LEFT  04/30/2017  . IRRIGATION AND DEBRIDEMENT KNEE Right 04/12/2016   Procedure: IRRIGATION AND DEBRIDEMENT KNEE;  Surgeon: Nicholes Stairs, MD;  [facility];  Service: Orthopedics;  Laterality: Right;  . IRRIGATION AND DEBRIDEMENT KNEE Right 12/16/2016   Procedure: Repeat irrigation and debridement right knee, wound closure  wound vac REPLACEMENT ANTIBIOTIC SPACERS;  Surgeon: Paralee Cancel, MD;  [facility];  Service: Orthopedics;  Laterality: Right;  . KNEE ARTHROSCOPY  02/13/2012   Procedure: ARTHROSCOPY KNEE;  Surgeon: Johnn Hai, MD;  Location: [facility];  Service: Orthopedics;  Laterality: Left;  WITH DEBRIDEMENt   . KNEE ARTHROSCOPY WITH LATERAL MENISECTOMY  02/13/2012   Procedure: KNEE ARTHROSCOPY WITH LATERAL MENISECTOMY;  Surgeon: Johnn Hai, MD;  Location[facility];  Service: Orthopedics;;  partial  . LATISSIMUS  FLAP RIGHT CHEST  02-23-2015   [facility]  . LEFT MODIFIED RADICAL MASTECTOMY/ RIGHT TOTAL MASTECTOMY  11-13-2007   LEFT BREAST CANCER W/ AXILLARY LYMPH NODE METASTASIS AND POST NEOADJUVANT CHEMO  . MASS EXCISION Right 06/29/2017   Procedure: EXCISION OF METASTATIC BREAST CANCER TO SKIN RIGHT SHOULDER, PLACEMENT OF CELLERATE;  Surgeon: Wallace Going, DO;  [facility];  Service: Plastics;  Laterality: Right;  . PLACEMENT PORT-A-CATH  06/22/2007    new port placed 2015, right chest  . REIMPLANTATION OF TOTAL KNEE Right 04/27/2017   Procedure: Reimplantation of right total knee arthroplasty;  Surgeon: Paralee Cancel, MD;  [facility];  Service: Orthopedics;  Laterality: Right;  Adductor Block  . RESECTION OF ISOLATED CHEST WALL DISEASE/ ABDOMINAL SKIN FLAP  02/ 2015     [facility]   RIGHT CHEST WALL METASTATIC SKIN CARCINOMA  . SKIN SPLIT GRAFT Right 01/29/2017   Procedure: SKIN GRAFT SPLIT THICKNESS TO RIGHT KNEE WOUND;  Surgeon: Wallace Going, DO;  [facility];  Service: Clinical cytogeneticist;  Laterality: Right;  . SKIN SPLIT GRAFT Right 09/07/2017   Procedure: SKIN GRAFT SPLIT THICKNESS TO RIGHT KNEE WOUND WITH PLACEMENT OF VAC DRESSING TO GRAFT SITE;  Surgeon: Wallace Going, DO;  Location: [place];  Service: Plastics;  Laterality: Right;  . TOTAL KNEE ARTHROPLASTY Left 09/23/2012   Procedure: LEFT TOTAL KNEE ARTHROPLASTY;  Surgeon: Johnn Hai, MD;  [facility];  Service: Orthopedics;  Laterality: Left;  . TOTAL KNEE ARTHROPLASTY Right 03/20/2016   Procedure: RIGHT TOTAL KNEE ARTHROPLASTY;  Surgeon: Susa Day, MD;  [facility];  Service: Orthopedics;  Laterality: Right;  . TRANSTHORACIC ECHOCARDIOGRAM  04/24/2017   ef 74-12%, grade 1 diastolic dysfuction/  mild MR with mild prolapse anterior leaflet/ mild TR/ PASP 17mmHg    There were no vitals filed for this visit.  Subjective Assessment - 11/16/17 1016    Subjective  I am doing better with pain. I  am using a topical rub to help the discomfort.      Currently in Pain?  Yes    Pain Score  2     Pain Orientation  Right    Pain Descriptors / Indicators  Tightness    Pain Type  Chronic pain;Surgical pain    Pain Onset  More than a month ago    Aggravating Factors   a lot of sitting, too much activity    Pain Relieving Factors  topical rub, ice         [facility] PT Assessment - 11/16/17 0001      Assessment   Medical Diagnosis  Rt TKA reconstruction s/p I&D with wound vac placement x 2    Onset Date/Surgical Date  04/27/17      Prior Function   Level of Independence  Independent    Vocation  On disability      Cognition   Overall Cognitive Status  Within Functional Limits for tasks assessed      Observation/Other Assessments   Focus on Therapeutic Outcomes (FOTO)   42% limitation      Strength   Right Hip Flexion  4/5    Right Knee Flexion  4+/5    Right Knee Extension  4/5      Transfers   Five time sit to stand comments   16.4 seconds      Ambulation/Gait   Ambulation/Gait  Yes    Ambulation/Gait Assistance  5: Supervision    Gait Pattern  Decreased stance time - right;Decreased step length - left;Trendelenburg;Lateral hip instability;Wide base of support      6 minute walk test results    Endurance additional comments  3 minute walk test: 580                   [facility] Adult PT Treatment/Exercise - 11/16/17 0001      Knee/Hip Exercises: Standing   Hip Abduction  Both;20 reps;Knee straight    Abduction Limitations  3# added    Hip Extension  Both;20 reps;Knee straight    Extension Limitations  3# added    Rebounder  weight shifting 3 ways- 1 minute each      Knee/Hip Exercises: Seated   Long Arc Quad  Strengthening;Both;2 sets;15 reps;Weights    Long Arc Quad Weight  4 lbs.    Ball Squeeze  30x    Marching  Strengthening;Both;2 sets;15 reps;Weights 4#    Hamstring Curl  Strengthening;Both;2 sets;15 reps;Weights     Hamstring Weights  4 lbs.  PT Short Term Goals - 11/04/17 0909      PT SHORT TERM GOAL #2   Title  Pt will be able to complete atleast 20 repetitions of SLR without extension lag, which will improve her safety with ambulation and other activity.     Time  3    Period  Weeks    Status  Achieved Can do 2x10      PT SHORT TERM GOAL #3   Title  Pt will demo Lt knee AROM flexion to atlast 100 deg which will help with technique during sit to stand.     Time  3    Period  Weeks    Status  Achieved 113 degrees      PT SHORT TERM GOAL #5   Title  Pt will demo proper mechanics with sit to stand, without significant weight shift Lt and no more than 1 UE support for safety.     Time  3    Period  Weeks    Status  Achieved        PT Long Term Goals - 11/16/17 1018      PT LONG TERM GOAL #1   Title  independent with HEP    Time  6    Period  Weeks    Status  On-going    Target Date  12/28/17      PT LONG TERM GOAL #2   Title  demonstrate improved functional strength by performing 5x STS without UE support in < 15 sec    Baseline  16.4 seconds with moderate control    Time  6    Period  Weeks    Status  Revised    Target Date  12/28/17      PT LONG TERM GOAL #3   Title  improve functional endurance by performing 3MWT with at least 700'     Baseline  580 feet    Time  8    Period  Weeks    Status  On-going    Target Date  12/28/17      PT LONG TERM GOAL #4   Title  FOTO score improved to </= 40% limitation for improved function    Time  6    Period  Weeks    Status  On-going    Target Date  12/28/17      PT LONG TERM GOAL #5   Title  report pain < 4/10 with activity for improved mobility and function    Status  Achieved      PT LONG TERM GOAL #6   Title  The patient will have the stamina  to walk in the grocery store 10-15 minutes.    Baseline  use of cart- able to walk 15 minutes    Status  Achieved            Plan - 11/16/17 1037    Clinical Impression Statement   Pt is making steady gains s/p Rt TKA replacement with many complications related to infection and hematoma.  Pt also has complicated medical history with metastatic cancer.  Pt with improved 5x sit to stand time to 16.21 seconds and 3 minute walk test is improved to 580 feet (547 at evaluation).  Gait pattern is improved with more normal base of support and improved knee flexion with swing through phase of gait.  Pt is able to walk in the grocery store and continues to require use of cart for safety.  Pt  with improved Rt knee and hip strength although still functionally weak.  Pt will continue to benefit from skilled PT for LE strength, gait, balance and endurance to improve safety and independence in the community.      Rehab Potential  Good    PT Frequency  2x / week    PT Duration  6 weeks    PT Treatment/Interventions  ADLs/Self Care Home Management;Cryotherapy;Moist Heat;Therapeutic exercise;Therapeutic activities;Functional mobility training;Stair training;Gait training;Balance training;Neuromuscular re-education;Patient/family education;Manual techniques;Vasopneumatic Device;Taping;Passive range of motion    PT Next Visit Plan  LE strength, gait, balance and endurance    PT Home Exercise Plan  Access Code: [email or web address]     Recommended Other Services  recert sent 6/96/29    Consulted and Agree with Plan of Care  Patient       Patient will benefit from skilled therapeutic intervention in order to improve the following deficits and impairments:  Abnormal gait, Pain, Decreased strength, Decreased activity tolerance, Decreased balance, Decreased mobility, Decreased range of motion  Visit Diagnosis: Acute pain of right knee - Plan: PT plan of care cert/re-cert  Stiffness of right knee, not elsewhere classified - Plan: PT plan of care cert/re-cert  Muscle weakness (generalized) - Plan: PT plan of care cert/re-cert  Other abnormalities of gait and mobility - Plan: PT plan of care  cert/re-cert  Localized edema - Plan: PT plan of care cert/re-cert     Problem List Patient Active Problem List   Diagnosis Date Noted  . Wound, open with complication 52/84/1324  . Fever   . Anemia due to chemotherapy 05/07/2017  . Abnormal LFTs 05/07/2017  . History of breast cancer   . Bilateral leg edema   . Enterococcal infection   . S/P revision of total knee, right 04/27/2017  . Pyogenic arthritis of right knee joint ([place])   . Acute blood loss anemia 12/12/2016  . Right knee abx spacer 12/02/2016  . Physical exam 07/31/2016  . Allergic reaction 07/24/2016  . Drug rash 07/24/2016  . S/P total knee arthroplasty, right 05/31/2016  . 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 Nonhealing surgical wound 05/28/2016  . Sepsis ([place]) 05/10/2016  . UTI (urinary tract infection) 05/10/2016  . Cellulitis of right knee 05/10/2016  . Blood clot in vein   . Wound dehiscence, surgical 04/12/2016  . 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 Vitamin D deficiency 01/25/2016  . Metastatic cancer to chest wall ([place]) 02/02/2015  . Primary osteoarthritis of right knee 02/01/2015  . Symptomatic anemia 02/01/2015  . Frequent UTI 01/12/2014  . Sinus tachycardia 11/03/2013  . SOB (shortness of breath) 11/03/2013  . Postoperative anemia due to acute blood loss 12/24/2012  .    09/23/2012  . Sinusitis 07/28/2012  . Neuropathy due to chemotherapeutic drug ([place]) 07/25/2011  . 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 Metastatic breast cancer ([place]) 11/12/2010  . Chronic anticoagulation 11/12/2010  . Anxiety, generalized 03/26/2008    Sigurd Sos, PT 11/16/17 10:56 AM  [facility] [address] [place], [place], [address] Phone: [phone]   Fax:  [phone]  Name: [name] MRN: [id number] Date of Birth: 1956-02-10

## 2017-11-18 ENCOUNTER — Encounter: Payer: Self-pay | Admitting: Physical Therapy

## 2017-11-19 ENCOUNTER — Ambulatory Visit: Payer: 59

## 2017-11-23 ENCOUNTER — Encounter: Payer: Self-pay | Admitting: Physical Therapy

## 2017-11-23 ENCOUNTER — Ambulatory Visit: Payer: 59 | Attending: Orthopedic Surgery | Admitting: Physical Therapy

## 2017-11-23 DIAGNOSIS — M6281 Muscle weakness (generalized): Secondary | ICD-10-CM | POA: Diagnosis not present

## 2017-11-23 DIAGNOSIS — M25612 Stiffness of left shoulder, not elsewhere classified: Secondary | ICD-10-CM | POA: Diagnosis not present

## 2017-11-23 DIAGNOSIS — M25561 Pain in right knee: Secondary | ICD-10-CM | POA: Diagnosis not present

## 2017-11-23 DIAGNOSIS — R2689 Other abnormalities of gait and mobility: Secondary | ICD-10-CM | POA: Insufficient documentation

## 2017-11-23 DIAGNOSIS — M25512 Pain in left shoulder: Secondary | ICD-10-CM | POA: Diagnosis not present

## 2017-11-23 DIAGNOSIS — R600 Localized edema: Secondary | ICD-10-CM | POA: Diagnosis not present

## 2017-11-23 DIAGNOSIS — M25661 Stiffness of right knee, not elsewhere classified: Secondary | ICD-10-CM | POA: Diagnosis not present

## 2017-11-23 NOTE — Therapy (Signed)
[facility] [address], [place], [place], [address] Phone: [phone]   Fax:  [phone]  Physical Therapy Treatment  Patient Details  Name: SKAI LICKTEIG MRN: [id number] Date of Birth: Oct 24, 1955 Referring Provider: Dr. Paralee Cancel   Encounter Date: 11/23/2017  PT End of Session - 11/23/17 1024    Visit Number  16    Number of Visits  36    Date for PT Re-Evaluation  12/28/17    Authorization Type  [facility] U[employer] (prior auth required for secondary insurance); 24 visits total approved (9 used prior to 09/02/17 visit); will need to request additional visits with review from insurance if needed past 24 visits    Authorization - Visit Number  16    Authorization - Number of Visits  36    PT Start Time  1010    PT Stop Time  1052    PT Time Calculation (min)  42 min    Activity Tolerance  Patient tolerated treatment well    Behavior During Therapy  [facility] for tasks assessed/performed       Past Medical History:  Diagnosis Date  . Arthritis   . 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 Heart murmur   . 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 MVP (mitral valve prolapse)    mild mvp w/ mild  regurg. per last echo 04-24-2017  . Neuropathy due to chemotherapeutic drug ([place])   . Non-healing surgical wound    right knee post re-implantation total knee arthroplasty 04-2017 unable to completely close surgical incision  . PONV (postoperative nausea and vomiting)    ponv likes scopolamine patch  . Port-A-Cath in place    RIGHT CHEST   . Red blood cell antibody positive   . Skin changes related to chemotherapy    per patient she has scabbed over skin circumventing port to right upper chest ;  state it is due to chemptherapy   . 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 APPLICATION OF A-CELL OF BACK Right 07/31/2016   Procedure: CELLERATE COLLAGEN PLACEMENT;  Surgeon: Loel Lofty Dillingham, DO;  Location: [place];  Service: Plastics;  Laterality: Right;  . APPLICATION OF WOUND VAC Right 06/29/2017   Procedure: APPLICATION OF WOUND VAC;  Surgeon: Wallace Going, DO;  Location: [facility];  Service: Plastics;  Laterality: Right;  . DEBRIDEMENT CHEST WALL RIGHT/ VAC PLACEMENT  02-09-2015    [facility]  . EXCISIONAL TOTAL KNEE ARTHROPLASTY WITH ANTIBIOTIC SPACERS Right 12/02/2016   Procedure: EXCISIONAL TOTAL  KNEE ARTHROPLASTY WITH ANTIBIOTIC SPACERS;  Surgeon: Paralee Cancel, MD;  Location: [facility];  Service: Orthopedics;  Laterality: Right;  90 mins  . HEMATOMA EVACUATION Right 06/29/2017   Procedure: EVACUATION HEMATOMA OF RIGHT KNEE;  Surgeon: Wallace Going, DO;  Location: [facility];  Service: Plastics;  Laterality: Right;  . HEMATOMA EVACUATION Right 06/29/2017   Procedure: EVACUATION RIGHT TOTAL KNEE HEMATOMA;  Surgeon: Paralee Cancel, MD;  [facility];  Service: Orthopedics;  Laterality: Right;  . HEMATOMA EVACUATION Right 07/14/2017   Procedure: EVACUATION RIGHT LEG HEMATOMA WITH APPLICATION OF WOUND VAC;  Surgeon: Paralee Cancel, MD;  [facility];  Service: Orthopedics;  Laterality: Right;  . hemotoma     evacuation left chest wall  . I&D EXTREMITY Right 07/17/2017   Procedure: EVACUATION RIGHT LEG HEMATOMA WITH WOUND VAC DRESSING CHANGE;  Surgeon: Paralee Cancel, MD;  [facility];  Service: Orthopedics;  Laterality: Right;  . I&D KNEE WITH POLY EXCHANGE Right 05/28/2016   Procedure: IRRIGATION AND DEBRIDEMENT KNEE WOUND VAC PLACMENT;  Surgeon: Susa Day, MD;  [facility];  Service: Orthopedics;  Laterality: Right;  . I&D KNEE WITH POLY EXCHANGE Right 05/30/2016   Procedure: RADICAL SYNOVECTOMY,IRRIGATION AND DEBRIDEMENT KNEE WITH POLY EXCHANGE WITH ANTIBIOTIC BEADS, APPLICATION OF WOUND VAC;  Surgeon: Susa Day, MD;  [facility];  Service: Orthopedics;  Laterality: Right;  . INCISION AND DRAINAGE OF WOUND Right 07/31/2016   Procedure: IRRIGATION AND DEBRIDEMENT RIGHT KNEE  WOUND;  Surgeon: Loel Lofty Dillingham, DO;  Location: [place];  Service: Plastics;  Laterality: Right;  . IR FLUORO GUIDE CV LINE LEFT  04/30/2017  . IR US GUIDE VASC ACCESS LEFT  04/30/2017  . IRRIGATION AND DEBRIDEMENT KNEE Right 04/12/2016   Procedure: IRRIGATION AND DEBRIDEMENT KNEE;  Surgeon: Nicholes Stairs, MD;  [facility];  Service: Orthopedics;  Laterality: Right;  . IRRIGATION AND DEBRIDEMENT KNEE Right 12/16/2016   Procedure: Repeat irrigation and debridement right knee, wound closure  wound vac REPLACEMENT ANTIBIOTIC SPACERS;  Surgeon: Paralee Cancel, MD;  [facility];  Service: Orthopedics;  Laterality: Right;  . KNEE ARTHROSCOPY  02/13/2012   Procedure: ARTHROSCOPY KNEE;  Surgeon: Johnn Hai, MD;  Location: [facility];  Service: Orthopedics;  Laterality: Left;  WITH DEBRIDEMENt   . KNEE ARTHROSCOPY WITH LATERAL MENISECTOMY  02/13/2012   Procedure: KNEE ARTHROSCOPY WITH LATERAL MENISECTOMY;  Surgeon: Johnn Hai, MD;  Location: [facility];  Service: Orthopedics;;   partial  . LATISSIMUS FLAP RIGHT CHEST  02-23-2015   [facility]  . LEFT MODIFIED RADICAL MASTECTOMY/ RIGHT TOTAL MASTECTOMY  11-13-2007   LEFT BREAST CANCER W/ AXILLARY LYMPH NODE METASTASIS AND POST NEOADJUVANT CHEMO  . MASS EXCISION Right 06/29/2017   Procedure: EXCISION OF METASTATIC BREAST CANCER TO SKIN RIGHT SHOULDER, PLACEMENT OF CELLERATE;  Surgeon: Wallace Going, DO;  [facility];  Service: Plastics;  Laterality: Right;  . PLACEMENT PORT-A-CATH  06/22/2007    new port placed 2015, right chest  . REIMPLANTATION OF TOTAL KNEE Right 04/27/2017   Procedure: Reimplantation of right total knee arthroplasty;  Surgeon: Paralee Cancel, MD;  [facility];  Service: Orthopedics;  Laterality: Right;  Adductor Block  . RESECTION OF ISOLATED CHEST WALL DISEASE/ ABDOMINAL SKIN FLAP  02/ 2015     [facility]   RIGHT CHEST WALL METASTATIC SKIN CARCINOMA  . SKIN SPLIT GRAFT Right 01/29/2017   Procedure: SKIN GRAFT SPLIT THICKNESS TO RIGHT KNEE WOUND;  Surgeon:  Dillingham, Loel Lofty, DO;  Location: [facility];  Service: Plastics;  Laterality: Right;  . SKIN SPLIT GRAFT Right 09/07/2017   Procedure: SKIN GRAFT SPLIT THICKNESS TO RIGHT KNEE WOUND WITH PLACEMENT OF VAC DRESSING TO GRAFT SITE;  Surgeon: Wallace Going, DO;  Location: [place];  Service: Plastics;  Laterality: Right;  . TOTAL KNEE ARTHROPLASTY Left 09/23/2012   Procedure: LEFT TOTAL KNEE ARTHROPLASTY;  Surgeon: Johnn Hai, MD;  Location[facility];  Service: Orthopedics;  Laterality: Left;  . TOTAL KNEE ARTHROPLASTY Right 03/20/2016   Procedure: RIGHT TOTAL KNEE ARTHROPLASTY;  Surgeon: Susa Day, MD;  Location[facility];  Service: Orthopedics;  Laterality: Right;  . TRANSTHORACIC ECHOCARDIOGRAM  04/24/2017   ef 15-05%, grade 1 diastolic dysfuction/  mild MR with mild prolapse anterior leaflet/ mild TR/ PASP 49mmHg    There were no vitals filed for this visit.  Subjective Assessment - 11/23/17 1026    Subjective  Feel  pretty good this AM.     Currently in Pain?  Yes LT knee 3/10, RT knee 2/10    Pain Descriptors / Indicators  Dull;Aching    Aggravating Factors   sitting too long or walking too long    Pain Relieving Factors  ice, topicals    Multiple Pain Sites  No                       [facility] Adult PT Treatment/Exercise - 11/23/17 0001      Knee/Hip Exercises: Aerobic   Nustep  L3 x 15 min PTA present for status update and monitoring      Knee/Hip Exercises: Standing   Hip Abduction  Both;20 reps;Knee straight    Abduction Limitations  4#    Hip Extension  Both;2 sets;10 reps;Knee straight    Extension Limitations  4#    Rebounder  weight shifting 3 ways- 1 minute each    Walking with Sports Cord  15# 8x foward and backward      Knee/Hip Exercises: Seated   Long Arc Quad  Strengthening;Both;3 sets;Weights    Long Arc Quad Weight  4 lbs.    Ball Squeeze  30x    Marching  Strengthening;Both;2 sets;15 reps;Weights 4#    Hamstring Curl  Strengthening;Both;2 sets;15 reps;Weights     Hamstring Weights  4 lbs.    Sit to Sand  2 sets;10 reps;without UE support               PT Short Term Goals - 11/04/17 0909      PT SHORT TERM GOAL #2   Title  Pt will be able to complete atleast 20 repetitions of SLR without extension lag, which will improve her safety with ambulation and other activity.     Time  3    Period  Weeks    Status  Achieved Can do 2x10      PT SHORT TERM GOAL #3   Title  Pt will demo Lt knee AROM flexion to atlast 100 deg which will help with technique during sit to stand.     Time  3    Period  Weeks    Status  Achieved 113 degrees      PT SHORT TERM GOAL #5   Title  Pt will demo proper mechanics with sit to stand, without significant weight shift Lt and no more than 1 UE support for safety.     Time  3    Period  Weeks    Status  Achieved        PT Long Term Goals - 11/16/17 1018      PT LONG TERM GOAL #1   Title  independent with HEP     Time  6    Period  Weeks    Status  On-going    Target Date  12/28/17      PT LONG TERM GOAL #2   Title  demonstrate improved functional strength by performing 5x STS without UE support in < 15 sec    Baseline  16.4 seconds with moderate control    Time  6    Period  Weeks    Status  Revised    Target Date  12/28/17      PT LONG TERM GOAL #3   Title  improve functional endurance by performing 3MWT with at least 700'     Baseline  580 feet    Time  8    Period  Weeks    Status  On-going    Target Date  12/28/17      PT LONG TERM GOAL #4   Title  FOTO score improved to </= 40% limitation for improved function    Time  6    Period  Weeks    Status  On-going    Target Date  12/28/17      PT LONG TERM GOAL #5   Title  report pain < 4/10 with activity for improved mobility and function    Status  Achieved      PT LONG TERM GOAL #6   Title  The patient will have the stamina  to walk in the grocery store 10-15 minutes.    Baseline  use of cart- able to walk 15 minutes    Status  Achieved            Plan - 11/23/17 1025    Clinical Impression Statement  Pt presents today with very little pain. Continue with current resistance levels as they remain challenging. Added the resistive walking to the program today so pt could work on functional strength with balance component.     Rehab Potential  Good    PT Frequency  2x / week    PT Duration  6 weeks    PT Treatment/Interventions  ADLs/Self Care Home Management;Cryotherapy;Moist Heat;Therapeutic exercise;Therapeutic activities;Functional mobility training;Stair training;Gait training;Balance training;Neuromuscular re-education;Patient/family education;Manual techniques;Vasopneumatic Device;Taping;Passive range of motion    PT Next Visit Plan  LE strength, gait, balance and endurance    PT Home Exercise Plan  Access Code: [email or web address]     Consulted and Agree with Plan of Care  Patient       Patient will benefit from skilled  therapeutic intervention in order to improve the following deficits and impairments:  Abnormal gait, Pain, Decreased strength, Decreased activity tolerance, Decreased balance, Decreased mobility, Decreased range of motion  Visit Diagnosis: Acute pain of right knee  Stiffness of right knee, not elsewhere classified  Muscle weakness (generalized)  Other abnormalities of gait and mobility  Localized edema  Stiffness of left shoulder, not elsewhere classified  Left shoulder pain, unspecified chronicity     Problem List Patient Active Problem List   Diagnosis Date Noted  . Wound, open with complication 64/40/3474  . Fever   . Anemia due to chemotherapy 05/07/2017  . Abnormal LFTs 05/07/2017  . History of breast cancer   . Bilateral leg edema   . Enterococcal infection   . S/P  revision of total knee, right 04/27/2017  . Pyogenic arthritis of right knee joint ([place])   . Acute blood loss anemia 12/12/2016  . Right knee abx spacer 12/02/2016  . Physical exam 07/31/2016  . Allergic reaction 07/24/2016  . Drug rash 07/24/2016  . S/P total knee arthroplasty, right 05/31/2016  . 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 Nonhealing surgical wound 05/28/2016  . Sepsis ([place]) 05/10/2016  . UTI (urinary tract infection) 05/10/2016  . Cellulitis of right knee 05/10/2016  . Blood clot in vein   . Wound dehiscence, surgical 04/12/2016  . 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 Vitamin D deficiency 01/25/2016  . 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 Primary osteoarthritis of right knee 02/01/2015  . Symptomatic anemia 02/01/2015  . Frequent UTI 01/12/2014  . Sinus tachycardia 11/03/2013  . SOB (shortness of breath) 11/03/2013  . Postoperative anemia due to acute blood loss 12/24/2012  .    09/23/2012  . Sinusitis 07/28/2012  . Neuropathy due to chemotherapeutic drug ([place]) 07/25/2011  . 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 Metastatic breast cancer ([place]) 11/12/2010  .  Chronic anticoagulation 11/12/2010  . Anxiety, generalized 03/26/2008    Michalla Ringer, PTA 11/23/2017, 12:21 PM  [facility] [address], [place], [place], [address] Phone: [phone]   Fax:  [phone]  Name: DAILEY ALBERSON MRN: [id number] Date of Birth: [birth year]

## 2017-11-30 ENCOUNTER — Ambulatory Visit: Payer: 59

## 2017-11-30 ENCOUNTER — Encounter: Payer: Self-pay | Admitting: Family Medicine

## 2017-11-30 DIAGNOSIS — M25561 Pain in right knee: Secondary | ICD-10-CM

## 2017-11-30 DIAGNOSIS — R2689 Other abnormalities of gait and mobility: Secondary | ICD-10-CM | POA: Diagnosis not present

## 2017-11-30 DIAGNOSIS — M6281 Muscle weakness (generalized): Secondary | ICD-10-CM | POA: Diagnosis not present

## 2017-11-30 DIAGNOSIS — M25661 Stiffness of right knee, not elsewhere classified: Secondary | ICD-10-CM | POA: Diagnosis not present

## 2017-11-30 DIAGNOSIS — R600 Localized edema: Secondary | ICD-10-CM | POA: Diagnosis not present

## 2017-11-30 DIAGNOSIS — M25612 Stiffness of left shoulder, not elsewhere classified: Secondary | ICD-10-CM | POA: Diagnosis not present

## 2017-11-30 DIAGNOSIS — M25512 Pain in left shoulder: Secondary | ICD-10-CM | POA: Diagnosis not present

## 2017-11-30 MED FILL — DOXYCYCLINE MONO 100 MG CAP: 100 | 30 days supply | Qty: 60 | Fill #5

## 2017-11-30 MED FILL — ELIQUIS 5 MG TABLET: 5 | 30 days supply | Qty: 60 | Fill #2

## 2017-11-30 MED FILL — TYKERB 250 MG TABLET: 250 | 28 days supply | Qty: 42 | Fill #2

## 2017-11-30 NOTE — Therapy (Signed)
[facility] [address] [place], [place], [address] Phone: [phone]   Fax:  [phone]  Physical Therapy Treatment  Patient Details  Name: [name] MRN: [id number] Date of Birth: 1955/11/18 Referring Provider: Dr. Paralee Cancel   Encounter Date: 11/30/2017  PT End of Session - 11/30/17 1220    Visit Number  17    Date for PT Re-Evaluation  12/28/17    Authorization Type  [facility] UMR/[employer] (prior auth required for secondary insurance); 24 visits total approved (9 used prior to 09/02/17).  12 additional approved    Authorization - Visit Number  26    Authorization - Number of Visits  36    PT Start Time  1140    PT Stop Time  1220    PT Time Calculation (min)  40 min    Activity Tolerance  Patient tolerated treatment well    Behavior During Therapy  WFL for tasks assessed/performed       Past Medical History:  Diagnosis Date  . Arthritis   . 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 Heart murmur   . 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 MVP (mitral valve prolapse)    mild mvp w/ mild regurg. per last echo 04-24-2017  . Neuropathy due to chemotherapeutic drug ([place])   .  Non-healing surgical wound    right knee post re-implantation total knee arthroplasty 04-2017 unable to completely close surgical incision  . PONV (postoperative nausea and vomiting)    ponv likes scopolamine patch  . Port-A-Cath in place    RIGHT CHEST   . Red blood cell antibody positive   . Skin changes related to chemotherapy    per patient she has scabbed over skin circumventing port to right upper chest ;  state it is due to chemptherapy   . 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 APPLICATION OF A-CELL OF BACK Right 07/31/2016   Procedure: CELLERATE COLLAGEN PLACEMENT;  Surgeon: Loel Lofty Dillingham, DO;  Location: [place];  Service: Plastics;  Laterality: Right;  . APPLICATION OF WOUND VAC Right 06/29/2017   Procedure: APPLICATION OF WOUND VAC;  Surgeon: Wallace Going, DO;  Location: [facility];  Service: Plastics;  Laterality: Right;  . DEBRIDEMENT CHEST WALL RIGHT/ VAC PLACEMENT  02-09-2015    [facility]  . EXCISIONAL TOTAL KNEE ARTHROPLASTY WITH ANTIBIOTIC SPACERS Right 12/02/2016   Procedure: EXCISIONAL TOTAL KNEE ARTHROPLASTY WITH ANTIBIOTIC SPACERS;  Surgeon: Paralee Cancel, MD;  Location: [facility];  Service: Orthopedics;  Laterality: Right;  90 mins  . HEMATOMA EVACUATION Right 06/29/2017   Procedure: EVACUATION HEMATOMA OF RIGHT KNEE;  Surgeon: Dillingham, Claire S, DO;  Location: [facility];  Service: Plastics;  Laterality: Right;  . HEMATOMA EVACUATION Right 06/29/2017   Procedure: EVACUATION RIGHT TOTAL KNEE HEMATOMA;  Surgeon: Olin, Matthew, MD;  [facility]: [facility];  Service: Orthopedics;  Laterality: Right;  . HEMATOMA EVACUATION Right 07/14/2017   Procedure: EVACUATION RIGHT LEG HEMATOMA WITH APPLICATION OF WOUND VAC;  Surgeon: Olin, Matthew, MD;  [facility];  Service: Orthopedics;  Laterality: Right;  . hemotoma      evacuation left chest wall  . I&D EXTREMITY Right 07/17/2017   Procedure: EVACUATION RIGHT LEG HEMATOMA WITH WOUND VAC DRESSING CHANGE;  Surgeon: Olin, Matthew, MD;  [facility]: [facility];  Service: Orthopedics;  Laterality: Right;  . I&D KNEE WITH POLY EXCHANGE Right 05/28/2016   Procedure: IRRIGATION AND DEBRIDEMENT KNEE WOUND VAC PLACMENT;  Surgeon: Jeffrey Beane, MD;  [facility];  Service: Orthopedics;  Laterality: Right;  . I&D KNEE WITH POLY EXCHANGE Right 05/30/2016   Procedure: RADICAL SYNOVECTOMY,IRRIGATION AND DEBRIDEMENT KNEE WITH POLY EXCHANGE WITH ANTIBIOTIC BEADS, APPLICATION OF WOUND VAC;  Surgeon: Jeffrey Beane, MD;  [facility];  Service: Orthopedics;  Laterality: Right;  . INCISION AND DRAINAGE OF WOUND Right 07/31/2016   Procedure: IRRIGATION AND DEBRIDEMENT RIGHT KNEE  WOUND;  Surgeon: Claire S Dillingham, DO;  [facility];  Service: Plastics;  Laterality: Right;  . IR FLUORO GUIDE CV LINE LEFT  04/30/2017  . IR US GUIDE VASC ACCESS LEFT  04/30/2017  . IRRIGATION AND DEBRIDEMENT KNEE Right 04/12/2016   Procedure: IRRIGATION AND DEBRIDEMENT KNEE;  Surgeon: Jason Patrick Rogers, MD;  [facility];  Service: Orthopedics;  Laterality: Right;  . IRRIGATION AND DEBRIDEMENT KNEE Right 12/16/2016   Procedure: Repeat irrigation and debridement right knee, wound closure  wound vac REPLACEMENT ANTIBIOTIC SPACERS;  Surgeon: Olin, Matthew, MD;  [facility];  Service: Orthopedics;  Laterality: Right;  . KNEE ARTHROSCOPY  02/13/2012   Procedure: ARTHROSCOPY KNEE;  Surgeon: Jeffrey C Beane, MD;  Location: [facility];  Service: Orthopedics;  Laterality: Left;  WITH DEBRIDEMENt   . KNEE ARTHROSCOPY WITH LATERAL MENISECTOMY  02/13/2012   Procedure: KNEE ARTHROSCOPY WITH LATERAL MENISECTOMY;  Surgeon: Jeffrey C Beane, MD;  Location: [facility];  Service: Orthopedics;;  partial  . LATISSIMUS FLAP RIGHT CHEST  02-23-2015   [facility]  . LEFT MODIFIED RADICAL  MASTECTOMY/ RIGHT TOTAL MASTECTOMY  11-13-2007   LEFT BREAST CANCER W/ AXILLARY LYMPH NODE METASTASIS AND POST NEOADJUVANT CHEMO  . MASS EXCISION Right 06/29/2017   Procedure: EXCISION OF METASTATIC BREAST CANCER TO SKIN RIGHT SHOULDER, PLACEMENT OF CELLERATE;  Surgeon: Dillingham, Claire S, DO;  [facility];  Service: Plastics;  Laterality: Right;  . PLACEMENT PORT-A-CATH  06/22/2007    new port placed 2015, right chest  . REIMPLANTATION OF TOTAL KNEE Right 04/27/2017   Procedure: Reimplantation of right total knee arthroplasty;  Surgeon: Olin, Matthew, MD;  [facility];  Service: Orthopedics;  Laterality: Right;  Adductor Block  . RESECTION OF ISOLATED CHEST WALL DISEASE/ ABDOMINAL SKIN FLAP  02/ 2015     [facility]   RIGHT CHEST WALL METASTATIC SKIN CARCINOMA  . SKIN SPLIT GRAFT Right 01/29/2017   Procedure: SKIN GRAFT SPLIT THICKNESS TO RIGHT KNEE WOUND;  Surgeon: Dillingham, Claire S, DO;  Location[facility];  Service: Plastics;  Laterality: Right;  . SKIN SPLIT GRAFT Right 09/07/2017     Procedure: SKIN GRAFT SPLIT THICKNESS TO RIGHT KNEE WOUND WITH PLACEMENT OF VAC DRESSING TO GRAFT SITE;  Surgeon: Wallace Going, DO;  Location: [place];  Service: Plastics;  Laterality: Right;  . TOTAL KNEE ARTHROPLASTY Left 09/23/2012   Procedure: LEFT TOTAL KNEE ARTHROPLASTY;  Surgeon: Johnn Hai, MD;  [facility];  Service: Orthopedics;  Laterality: Left;  . TOTAL KNEE ARTHROPLASTY Right 03/20/2016   Procedure: RIGHT TOTAL KNEE ARTHROPLASTY;  Surgeon: Susa Day, MD;  [facility];  Service: Orthopedics;  Laterality: Right;  . TRANSTHORACIC ECHOCARDIOGRAM  04/24/2017   ef 38-10%, grade 1 diastolic dysfuction/  mild MR with mild prolapse anterior leaflet/ mild TR/ PASP 24mmHg    There were no vitals filed for this visit.  Subjective Assessment - 11/30/17 1149    Subjective  I am doing well.  Staying in a lot due to the heat.  I have been working on my gait pattern.       Currently in Pain?  Yes    Pain Score  2     Pain Location  Knee    Pain Orientation  Right    Pain Descriptors / Indicators  Dull;Aching    Pain Onset  More than a month ago    Pain Frequency  Intermittent    Aggravating Factors   sitting too long, night time    Pain Relieving Factors  ice at night.                        [place] Adult PT Treatment/Exercise - 11/30/17 0001      Knee/Hip Exercises: Aerobic   Nustep  L3 x 10 min PT present for status update and monitoring      Knee/Hip Exercises: Standing   Hip Abduction  Both;20 reps;Knee straight    Abduction Limitations  4#    Hip Extension  Both;2 sets;10 reps;Knee straight    Extension Limitations  4#    Rebounder  weight shifting 3 ways- 1 minute each    Walking with Sports Cord  --    Other Standing Knee Exercises  standing on black pad: static balance and then arm motions alternating 2x10      Knee/Hip Exercises: Seated   Long Arc Quad  Strengthening;Both;3 sets;Weights    Long Arc Quad Weight  4 lbs.    Ball Squeeze  30x    Marching  Strengthening;Both;2 sets;15 reps;Weights 4#    Hamstring Curl  Strengthening;Both;2 sets;15 reps;Weights     Hamstring Weights  4 lbs.    Sit to Sand  2 sets;10 reps;without UE support               PT Short Term Goals - 11/04/17 0909      PT SHORT TERM GOAL #2   Title  Pt will be able to complete atleast 20 repetitions of SLR without extension lag, which will improve her safety with ambulation and other activity.     Time  3    Period  Weeks    Status  Achieved Can do 2x10      PT SHORT TERM GOAL #3   Title  Pt will demo Lt knee AROM flexion to atlast 100 deg which will help with technique during sit to stand.     Time  3    Period  Weeks    Status  Achieved 113 degrees      PT SHORT TERM GOAL #5  Title  Pt will demo proper mechanics with sit to stand, without significant weight shift Lt and no more than 1 UE support for safety.     Time  3     Period  Weeks    Status  Achieved        PT Long Term Goals - 11/16/17 1018      PT LONG TERM GOAL #1   Title  independent with HEP    Time  6    Period  Weeks    Status  On-going    Target Date  12/28/17      PT LONG TERM GOAL #2   Title  demonstrate improved functional strength by performing 5x STS without UE support in < 15 sec    Baseline  16.4 seconds with moderate control    Time  6    Period  Weeks    Status  Revised    Target Date  12/28/17      PT LONG TERM GOAL #3   Title  improve functional endurance by performing 3MWT with at least 700'     Baseline  580 feet    Time  8    Period  Weeks    Status  On-going    Target Date  12/28/17      PT LONG TERM GOAL #4   Title  FOTO score improved to </= 40% limitation for improved function    Time  6    Period  Weeks    Status  On-going    Target Date  12/28/17      PT LONG TERM GOAL #5   Title  report pain < 4/10 with activity for improved mobility and function    Status  Achieved      PT LONG TERM GOAL #6   Title  The patient will have the stamina  to walk in the grocery store 10-15 minutes.    Baseline  use of cart- able to walk 15 minutes    Status  Achieved            Plan - 11/30/17 1158    Clinical Impression Statement  Pt continues to make steady progress regarding strength and endurance.  Pt with improved gait pattern and makes corrections to reduce excessive trunk motion.  Pt is tolerating advancement of ankle weights.  Pt will continue to benefit from skilled PT for LE strength, endurance, gait and balance to address chronic deficits.      Rehab Potential  Good    PT Frequency  2x / week    PT Duration  6 weeks    PT Treatment/Interventions  ADLs/Self Care Home Management;Cryotherapy;Moist Heat;Therapeutic exercise;Therapeutic activities;Functional mobility training;Stair training;Gait training;Balance training;Neuromuscular re-education;Patient/family education;Manual techniques;Vasopneumatic  Device;Taping;Passive range of motion    PT Next Visit Plan  LE strength, gait, balance and endurance    PT Home Exercise Plan  Access Code: [email or web address]     Consulted and Agree with Plan of Care  Patient       Patient will benefit from skilled therapeutic intervention in order to improve the following deficits and impairments:  Abnormal gait, Pain, Decreased strength, Decreased activity tolerance, Decreased balance, Decreased mobility, Decreased range of motion  Visit Diagnosis: Acute pain of right knee  Stiffness of right knee, not elsewhere classified  Muscle weakness (generalized)  Other abnormalities of gait and mobility     Problem List Patient Active Problem List   Diagnosis Date Noted  . Wound, open with  complication 07/06/1550  . Fever   . Anemia due to chemotherapy 05/07/2017  . Abnormal LFTs 05/07/2017  . History of breast cancer   . Bilateral leg edema   . Enterococcal infection   . S/P revision of total knee, right 04/27/2017  . Pyogenic arthritis of right knee joint ([place])   . Acute blood loss anemia 12/12/2016  . Right knee abx spacer 12/02/2016  . Physical exam 07/31/2016  . Allergic reaction 07/24/2016  . Drug rash 07/24/2016  . S/P total knee arthroplasty, right 05/31/2016  . 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 Nonhealing surgical wound 05/28/2016  . Sepsis ([place]) 05/10/2016  . UTI (urinary tract infection) 05/10/2016  . Cellulitis of right knee 05/10/2016  . Blood clot in vein   . Wound dehiscence, surgical 04/12/2016  . 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 Vitamin D deficiency 01/25/2016  . 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 Primary osteoarthritis of right knee 02/01/2015  . Symptomatic anemia 02/01/2015  . Frequent UTI 01/12/2014  . Sinus tachycardia 11/03/2013  . SOB (shortness of breath) 11/03/2013  . Postoperative anemia due to acute blood loss 12/24/2012  .    09/23/2012  . Sinusitis 07/28/2012  . Neuropathy due to  chemotherapeutic drug ([place]) 07/25/2011  . 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 Metastatic breast cancer ([place]) 11/12/2010  . Chronic anticoagulation 11/12/2010  . Anxiety, generalized 03/26/2008     Sigurd Sos, PT 11/30/17 12:25 PM  [facility] [address] [place], [place], [address] Phone: [phone]   Fax:  [phone]  Name: QUITA MCGRORY MRN: [id number] Date of Birth: 1956-04-23

## 2017-12-01 ENCOUNTER — Other Ambulatory Visit: Payer: Self-pay

## 2017-12-01 DIAGNOSIS — C50412 Malignant neoplasm of upper-outer quadrant of left female breast: Secondary | ICD-10-CM | POA: Diagnosis not present

## 2017-12-01 DIAGNOSIS — C50912 Malignant neoplasm of unspecified site of left female breast: Secondary | ICD-10-CM | POA: Diagnosis not present

## 2017-12-01 DIAGNOSIS — Z5111 Encounter for antineoplastic chemotherapy: Secondary | ICD-10-CM | POA: Diagnosis not present

## 2017-12-01 DIAGNOSIS — I340 Nonrheumatic mitral (valve) insufficiency: Secondary | ICD-10-CM | POA: Diagnosis not present

## 2017-12-01 DIAGNOSIS — Z5112 Encounter for antineoplastic immunotherapy: Secondary | ICD-10-CM | POA: Diagnosis not present

## 2017-12-01 DIAGNOSIS — I272 Pulmonary hypertension, unspecified: Secondary | ICD-10-CM | POA: Diagnosis not present

## 2017-12-01 DIAGNOSIS — Z171 Estrogen receptor negative status [ER-]: Secondary | ICD-10-CM | POA: Diagnosis not present

## 2017-12-01 DIAGNOSIS — R748 Abnormal levels of other serum enzymes: Secondary | ICD-10-CM | POA: Diagnosis not present

## 2017-12-01 DIAGNOSIS — J040 Acute laryngitis: Secondary | ICD-10-CM | POA: Diagnosis not present

## 2017-12-01 DIAGNOSIS — C792 Secondary malignant neoplasm of skin: Secondary | ICD-10-CM | POA: Diagnosis not present

## 2017-12-01 DIAGNOSIS — M7989 Other specified soft tissue disorders: Secondary | ICD-10-CM | POA: Diagnosis not present

## 2017-12-01 MED ORDER — DARIFENACIN HYDROBROMIDE ER 15 MG PO TB24: 15.0000 mg | ORAL_TABLET | Freq: Every morning | ORAL | 1 refills | Status: DC

## 2017-12-01 MED FILL — DARIFENACIN ER 15 MG TABLET: 15 | 90 days supply | Qty: 90 | Fill #0

## 2017-12-03 ENCOUNTER — Ambulatory Visit: Payer: 59

## 2017-12-03 DIAGNOSIS — M25512 Pain in left shoulder: Secondary | ICD-10-CM | POA: Diagnosis not present

## 2017-12-03 DIAGNOSIS — R600 Localized edema: Secondary | ICD-10-CM | POA: Diagnosis not present

## 2017-12-03 DIAGNOSIS — R2689 Other abnormalities of gait and mobility: Secondary | ICD-10-CM

## 2017-12-03 DIAGNOSIS — M25561 Pain in right knee: Secondary | ICD-10-CM | POA: Diagnosis not present

## 2017-12-03 DIAGNOSIS — M6281 Muscle weakness (generalized): Secondary | ICD-10-CM | POA: Diagnosis not present

## 2017-12-03 DIAGNOSIS — M25612 Stiffness of left shoulder, not elsewhere classified: Secondary | ICD-10-CM | POA: Diagnosis not present

## 2017-12-03 DIAGNOSIS — M25661 Stiffness of right knee, not elsewhere classified: Secondary | ICD-10-CM

## 2017-12-03 NOTE — Therapy (Signed)
[facility] [address] [place], [place], [address] Phone: [phone]   Fax:  [phone]  Physical Therapy Treatment  Patient Details  Name: [name] MRN: [id number] Date of Birth: 24-Nov-1955 Referring Provider: Dr. Paralee Cancel   Encounter Date: 12/03/2017  PT End of Session - 12/03/17 1003    Visit Number  18    Number of Visits  36    Date for PT Re-Evaluation  12/28/17    Authorization Type  [facility] [employer] (prior auth required for secondary insurance); 24 visits total approved (9 used prior to 09/02/17).  12 additional approved    Authorization - Visit Number  27    Authorization - Number of Visits  36    PT Start Time  [phone]    PT Stop Time  1003    PT Time Calculation (min)  39 min    Activity Tolerance  Patient tolerated treatment well    Behavior During Therapy  WFL for tasks assessed/performed       Past Medical History:  Diagnosis Date  . Arthritis   . 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 Heart murmur   . 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 MVP (mitral valve prolapse)    mild mvp w/ mild regurg. per last echo 04-24-2017  . Neuropathy due to chemotherapeutic  drug ([place])   . Non-healing surgical wound    right knee post re-implantation total knee arthroplasty 04-2017 unable to completely close surgical incision  . PONV (postoperative nausea and vomiting)    ponv likes scopolamine patch  . Port-A-Cath in place    RIGHT CHEST   . Red blood cell antibody positive   . Skin changes related to chemotherapy    per patient she has scabbed over skin circumventing port to right upper chest ;  state it is due to chemptherapy   . 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 APPLICATION OF A-CELL OF BACK Right 07/31/2016   Procedure: CELLERATE COLLAGEN PLACEMENT;  Surgeon: Loel Lofty Dillingham, DO;  Location: [place];  Service: Plastics;  Laterality: Right;  . APPLICATION OF WOUND VAC Right 06/29/2017   Procedure: APPLICATION OF WOUND VAC;  Surgeon: Wallace Going, DO;  Location: [facility];  Service: Plastics;  Laterality: Right;  . DEBRIDEMENT CHEST WALL RIGHT/ VAC PLACEMENT  02-09-2015    [facility]  . EXCISIONAL TOTAL KNEE ARTHROPLASTY WITH ANTIBIOTIC SPACERS Right 12/02/2016   Procedure: EXCISIONAL TOTAL KNEE ARTHROPLASTY WITH ANTIBIOTIC SPACERS;  Surgeon: Paralee Cancel, MD;  Location:  [facility];  Service: Orthopedics;  Laterality: Right;  90 mins  . HEMATOMA EVACUATION Right 06/29/2017   Procedure: EVACUATION HEMATOMA OF RIGHT KNEE;  Surgeon: Wallace Going, DO;  Location: [facility];  Service: Plastics;  Laterality: Right;  . HEMATOMA EVACUATION Right 06/29/2017   Procedure: EVACUATION RIGHT TOTAL KNEE HEMATOMA;  Surgeon: Paralee Cancel, MD;  Location: [facility];  Service: Orthopedics;  Laterality: Right;  . HEMATOMA EVACUATION Right 07/14/2017   Procedure: EVACUATION RIGHT LEG HEMATOMA WITH APPLICATION OF WOUND VAC;  Surgeon: Paralee Cancel, MD;  Location: [facility];  Service: Orthopedics;  Laterality: Right;  .  hemotoma     evacuation left chest wall  . I&D EXTREMITY Right 07/17/2017   Procedure: EVACUATION RIGHT LEG HEMATOMA WITH WOUND VAC DRESSING CHANGE;  Surgeon: Paralee Cancel, MD;  Location: [facility];  Service: Orthopedics;  Laterality: Right;  . I&D KNEE WITH POLY EXCHANGE Right 05/28/2016   Procedure: IRRIGATION AND DEBRIDEMENT KNEE WOUND VAC PLACMENT;  Surgeon: Susa Day, MD;  Location[facility];  Service: Orthopedics;  Laterality: Right;  . I&D KNEE WITH POLY EXCHANGE Right 05/30/2016   Procedure: RADICAL SYNOVECTOMY,IRRIGATION AND DEBRIDEMENT KNEE WITH POLY EXCHANGE WITH ANTIBIOTIC BEADS, APPLICATION OF WOUND VAC;  Surgeon: Susa Day, MD;  [facility];  Service: Orthopedics;  Laterality: Right;  . INCISION AND DRAINAGE OF WOUND Right 07/31/2016   Procedure: IRRIGATION AND DEBRIDEMENT RIGHT KNEE  WOUND;  Surgeon: Loel Lofty Dillingham, DO;  Location: [place];  Service: Plastics;  Laterality: Right;  . IR FLUORO GUIDE CV LINE LEFT  04/30/2017  . IR US GUIDE VASC ACCESS LEFT  04/30/2017  . IRRIGATION AND DEBRIDEMENT KNEE Right 04/12/2016   Procedure: IRRIGATION AND DEBRIDEMENT KNEE;  Surgeon: Nicholes Stairs, MD;  Location: [facility];  Service: Orthopedics;  Laterality: Right;  . IRRIGATION AND DEBRIDEMENT KNEE Right 12/16/2016   Procedure: Repeat irrigation and debridement right knee, wound closure  wound vac REPLACEMENT ANTIBIOTIC SPACERS;  Surgeon: Paralee Cancel, MD;  Location: [facility];  Service: Orthopedics;  Laterality: Right;  . KNEE ARTHROSCOPY  02/13/2012   Procedure: ARTHROSCOPY KNEE;  Surgeon: Johnn Hai, MD;  Location: [facility];  Service: Orthopedics;  Laterality: Left;  WITH DEBRIDEMENt   . KNEE ARTHROSCOPY WITH LATERAL MENISECTOMY  02/13/2012   Procedure: KNEE ARTHROSCOPY WITH LATERAL MENISECTOMY;  Surgeon: Johnn Hai, MD;  Location: [facility];  Service: Orthopedics;;  partial  . LATISSIMUS FLAP RIGHT CHEST  02-23-2015   [facility]  . LEFT MODIFIED  RADICAL MASTECTOMY/ RIGHT TOTAL MASTECTOMY  11-13-2007   LEFT BREAST CANCER W/ AXILLARY LYMPH NODE METASTASIS AND POST NEOADJUVANT CHEMO  . MASS EXCISION Right 06/29/2017   Procedure: EXCISION OF METASTATIC BREAST CANCER TO SKIN RIGHT SHOULDER, PLACEMENT OF CELLERATE;  Surgeon: Wallace Going, DO;  Location: [facility];  Service: Plastics;  Laterality: Right;  . PLACEMENT PORT-A-CATH  06/22/2007    new port placed 2015, right chest  . REIMPLANTATION OF TOTAL KNEE Right 04/27/2017   Procedure: Reimplantation of right total knee arthroplasty;  Surgeon: Paralee Cancel, MD;  Location: [facility];  Service: Orthopedics;  Laterality: Right;  Adductor Block  . RESECTION OF ISOLATED CHEST WALL DISEASE/ ABDOMINAL SKIN FLAP  02/ 2015     [facility]   RIGHT CHEST WALL METASTATIC SKIN CARCINOMA  . SKIN SPLIT GRAFT Right 01/29/2017   Procedure: SKIN GRAFT SPLIT THICKNESS TO RIGHT KNEE WOUND;  Surgeon: Wallace Going, DO;  Location: [facility];  Service: Plastics;  Laterality: Right;  . SKIN SPLIT GRAFT Right 09/07/2017   Procedure: SKIN GRAFT SPLIT THICKNESS TO RIGHT KNEE WOUND WITH PLACEMENT OF VAC DRESSING TO GRAFT SITE;  Surgeon: Wallace Going, DO;  Location: [place];  Service: Plastics;  Laterality: Right;  . TOTAL KNEE ARTHROPLASTY Left 09/23/2012   Procedure: LEFT TOTAL KNEE ARTHROPLASTY;  Surgeon: Johnn Hai, MD;  Location: [facility];  Service: Orthopedics;  Laterality: Left;  . TOTAL KNEE ARTHROPLASTY Right 03/20/2016   Procedure: RIGHT TOTAL KNEE ARTHROPLASTY;  Surgeon: Susa Day, MD;  Location[facility];  Service: Orthopedics;  Laterality: Right;  . TRANSTHORACIC ECHOCARDIOGRAM  04/24/2017   ef 09-60%, grade 1 diastolic dysfuction/  mild MR with mild prolapse anterior leaflet/ mild TR/ PASP 23mmHg    There were no vitals filed for this visit.  Subjective Assessment - 12/03/17 0927    Subjective  I am working on my walking and focusing on heel to toe pattern.      Patient Stated  Goals  improve standing and endurance    Currently in Pain?  Yes    Pain Score  2     Pain Location  Knee    Pain Orientation  Right    Pain Descriptors / Indicators  Dull;Aching    Pain Onset  More than a month ago    Pain Frequency  Intermittent                       [facility] Adult PT Treatment/Exercise - 12/03/17 0001      Knee/Hip Exercises: Aerobic   Nustep  L3 x 10 min PT present for status update and monitoring      Knee/Hip Exercises: Standing   Hip Abduction  Both;20 reps;Knee straight    Abduction Limitations  4#    Hip Extension  Both;2 sets;10 reps;Knee straight    Extension Limitations  4#    Rebounder  weight shifting 3 ways- 1 minute each    Walking with Sports Cord  15# 8x foward and backward    Other Standing Knee Exercises  standing on black pad: static balance and then arm motions alternating 2x10      Knee/Hip Exercises: Seated   Long Arc Quad  Strengthening;Both;3 sets;Weights    Long Arc Quad Weight  4 lbs.    Ball Squeeze  30x    Marching  Strengthening;Both;2 sets;15 reps;Weights 4#    Hamstring Curl  Strengthening;Both;2 sets;15 reps;Weights     Hamstring Weights  4 lbs.    Sit to Sand  2 sets;10 reps;without UE support               PT Short Term Goals - 11/04/17 0909      PT SHORT TERM GOAL #2   Title  Pt will be able to complete atleast 20 repetitions of SLR without extension lag, which will improve her safety with ambulation and other activity.     Time  3    Period  Weeks    Status  Achieved Can do 2x10      PT SHORT TERM GOAL #3   Title  Pt will demo Lt knee AROM flexion to atlast 100 deg which will help with technique during sit to stand.     Time  3    Period  Weeks    Status  Achieved 113 degrees      PT SHORT TERM GOAL #5   Title  Pt will demo proper  mechanics with sit to stand, without significant weight shift Lt and no more than 1 UE support for safety.     Time  3    Period  Weeks    Status  Achieved         PT Long Term Goals - 11/16/17 1018      PT LONG TERM GOAL #1   Title  independent with HEP    Time  6    Period  Weeks    Status  On-going    Target Date  12/28/17      PT LONG TERM GOAL #2   Title  demonstrate improved functional strength by performing 5x STS without UE support in < 15 sec    Baseline  16.4 seconds with moderate control    Time  6    Period  Weeks    Status  Revised    Target Date  12/28/17      PT LONG TERM GOAL #3   Title  improve functional endurance by performing 3MWT with at least 700'     Baseline  580 feet    Time  8    Period  Weeks    Status  On-going    Target Date  12/28/17      PT LONG TERM GOAL #4   Title  FOTO score improved to </= 40% limitation for improved function    Time  6    Period  Weeks    Status  On-going    Target Date  12/28/17      PT LONG TERM GOAL #5   Title  report pain < 4/10 with activity for improved mobility and function    Status  Achieved      PT LONG TERM GOAL #6   Title  The patient will have the stamina  to walk in the grocery store 10-15 minutes.    Baseline  use of cart- able to walk 15 minutes    Status  Achieved            Plan - 12/03/17 0929    Clinical Impression Statement  Pt with improved gait pattern with heel to toe progression today.  Pt continues to demonstrate wide base of support for stability.  Pt advanced to standing balance exercises including resisted walking and standing on foam pad with diagonals with ball to work on stability and balance.  Pt requires stand by assistance and min assistance for higher level exercise for safety.  Pt will continue to benefit from skilled PT for LE strength, endurance, gait and balance to address chronic deficits.      PT Frequency  2x / week    PT Duration  6 weeks    PT Treatment/Interventions  ADLs/Self Care Home Management;Cryotherapy;Moist Heat;Therapeutic exercise;Therapeutic activities;Functional mobility training;Stair training;Gait  training;Balance training;Neuromuscular re-education;Patient/family education;Manual techniques;Vasopneumatic Device;Taping;Passive range of motion    PT Next Visit Plan  LE strength, gait, balance and endurance    PT Home Exercise Plan  Access Code: [email or web address]     Consulted and Agree with Plan of Care  Patient       Patient will benefit from skilled therapeutic intervention in order to improve the following deficits and impairments:  Abnormal gait, Pain, Decreased strength, Decreased activity tolerance, Decreased balance, Decreased mobility, Decreased range of motion  Visit Diagnosis: Acute pain of right knee  Stiffness of right knee, not elsewhere classified  Muscle weakness (generalized)  Other abnormalities of gait and mobility  Problem List Patient Active Problem List   Diagnosis Date Noted  . Wound, open with complication 85/27/7824  . Fever   . Anemia due to chemotherapy 05/07/2017  . Abnormal LFTs 05/07/2017  . History of breast cancer   . Bilateral leg edema   . Enterococcal infection   . S/P revision of total knee, right 04/27/2017  . Pyogenic arthritis of right knee joint ([place])   . Acute blood loss anemia 12/12/2016  . Right knee abx spacer 12/02/2016  . Physical exam 07/31/2016  . Allergic reaction 07/24/2016  . Drug rash 07/24/2016  . S/P total knee arthroplasty, right 05/31/2016  . 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 Nonhealing surgical wound 05/28/2016  . Sepsis ([place]) 05/10/2016  . UTI (urinary tract infection) 05/10/2016  . Cellulitis of right knee 05/10/2016  . Blood clot in vein   . Wound dehiscence, surgical 04/12/2016  . 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 Vitamin D deficiency 01/25/2016  . 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 Primary osteoarthritis of right knee 02/01/2015  . Symptomatic anemia 02/01/2015  . Frequent UTI 01/12/2014  . Sinus tachycardia 11/03/2013  . SOB (shortness of breath) 11/03/2013  . Postoperative  anemia due to acute blood loss 12/24/2012  .    09/23/2012  . Sinusitis 07/28/2012  . Neuropathy due to chemotherapeutic drug ([place]) 07/25/2011  . 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 Metastatic breast cancer ([place]) 11/12/2010  . Chronic anticoagulation 11/12/2010  . Anxiety, generalized 03/26/2008     Sigurd Sos, PT 12/03/17 10:05 AM  [facility] [address], [place], [place], [address] Phone: [phone]   Fax:  [phone]  Name: CLARITZA JULY MRN: [id number] Date of Birth: Jul 26, 1955

## 2017-12-08 ENCOUNTER — Ambulatory Visit: Payer: 59

## 2017-12-08 DIAGNOSIS — M25661 Stiffness of right knee, not elsewhere classified: Secondary | ICD-10-CM | POA: Diagnosis not present

## 2017-12-08 DIAGNOSIS — M6281 Muscle weakness (generalized): Secondary | ICD-10-CM | POA: Diagnosis not present

## 2017-12-08 DIAGNOSIS — M25561 Pain in right knee: Secondary | ICD-10-CM | POA: Diagnosis not present

## 2017-12-08 DIAGNOSIS — R600 Localized edema: Secondary | ICD-10-CM | POA: Diagnosis not present

## 2017-12-08 DIAGNOSIS — M25612 Stiffness of left shoulder, not elsewhere classified: Secondary | ICD-10-CM | POA: Diagnosis not present

## 2017-12-08 DIAGNOSIS — R2689 Other abnormalities of gait and mobility: Secondary | ICD-10-CM

## 2017-12-08 DIAGNOSIS — M25512 Pain in left shoulder: Secondary | ICD-10-CM | POA: Diagnosis not present

## 2017-12-08 NOTE — Therapy (Signed)
[facility] [address] [place], [place], [address] Phone: [phone]   Fax:  [phone]  Physical Therapy Treatment  Patient Details  Name: [name] MRN: [id number] Date of Birth: [birth year] Referring Provider: Dr. Paralee Cancel   Encounter Date: 12/08/2017  PT End of Session - 12/08/17 1002    Visit Number  19    Number of Visits  36    Date for PT Re-Evaluation  12/28/17    Authorization Type  [facility] U[employer] (prior auth required for secondary insurance); 24 visits total approved (9 used prior to 09/02/17).  12 additional approved    Authorization - Visit Number  28    Authorization - Number of Visits  36    PT Start Time  [phone]    PT Stop Time  1005    PT Time Calculation (min)  42 min    Activity Tolerance  Patient tolerated treatment well    Behavior During Therapy  WFL for tasks assessed/performed       Past Medical History:  Diagnosis Date  . Arthritis   . 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 Heart murmur   . 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 MVP (mitral valve prolapse)    mild mvp w/ mild regurg. per last echo 04-24-2017  . Neuropathy due to chemotherapeutic  drug ([place])   . Non-healing surgical wound    right knee post re-implantation total knee arthroplasty 04-2017 unable to completely close surgical incision  . PONV (postoperative nausea and vomiting)    ponv likes scopolamine patch  . Port-A-Cath in place    RIGHT CHEST   . Red blood cell antibody positive   . Skin changes related to chemotherapy    per patient she has scabbed over skin circumventing port to right upper chest ;  state it is due to chemptherapy   . 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 APPLICATION OF A-CELL OF BACK Right 07/31/2016   Procedure: CELLERATE COLLAGEN PLACEMENT;  Surgeon: Loel Lofty Dillingham, DO;  Location: [facility];  Service: Plastics;  Laterality: Right;  . APPLICATION OF WOUND VAC Right 06/29/2017   Procedure: APPLICATION OF WOUND VAC;  Surgeon: Wallace Going, DO;  Location: [facility];  Service: Plastics;  Laterality: Right;  . DEBRIDEMENT CHEST WALL RIGHT/ VAC PLACEMENT  02-09-2015    [facility]  . EXCISIONAL TOTAL KNEE ARTHROPLASTY WITH ANTIBIOTIC SPACERS Right 12/02/2016   Procedure: EXCISIONAL TOTAL KNEE ARTHROPLASTY WITH ANTIBIOTIC SPACERS;  Surgeon: Paralee Cancel, MD;  Location:  [facility];  Service: Orthopedics;  Laterality: Right;  90 mins  . HEMATOMA EVACUATION Right 06/29/2017   Procedure: EVACUATION HEMATOMA OF RIGHT KNEE;  Surgeon: Wallace Going, DO;  [facility]: [facility];  Service: Plastics;  Laterality: Right;  . HEMATOMA EVACUATION Right 06/29/2017   Procedure: EVACUATION RIGHT TOTAL KNEE HEMATOMA;  Surgeon: Paralee Cancel, MD;  [facility];  Service: Orthopedics;  Laterality: Right;  . HEMATOMA EVACUATION Right 07/14/2017   Procedure: EVACUATION RIGHT LEG HEMATOMA WITH APPLICATION OF WOUND VAC;  Surgeon: Paralee Cancel, MD;  [facility];  Service: Orthopedics;  Laterality: Right;  .  hemotoma     evacuation left chest wall  . I&D EXTREMITY Right 07/17/2017   Procedure: EVACUATION RIGHT LEG HEMATOMA WITH WOUND VAC DRESSING CHANGE;  Surgeon: Paralee Cancel, MD;  [facility];  Service: Orthopedics;  Laterality: Right;  . I&D KNEE WITH POLY EXCHANGE Right 05/28/2016   Procedure: IRRIGATION AND DEBRIDEMENT KNEE WOUND VAC PLACMENT;  Surgeon: Susa Day, MD;  [facility];  Service: Orthopedics;  Laterality: Right;  . I&D KNEE WITH POLY EXCHANGE Right 05/30/2016   Procedure: RADICAL SYNOVECTOMY,IRRIGATION AND DEBRIDEMENT KNEE WITH POLY EXCHANGE WITH ANTIBIOTIC BEADS, APPLICATION OF WOUND VAC;  Surgeon: Susa Day, MD;  [facility];  Service: Orthopedics;  Laterality: Right;  . INCISION AND DRAINAGE OF WOUND Right 07/31/2016   Procedure: IRRIGATION AND DEBRIDEMENT RIGHT KNEE  WOUND;  Surgeon: Loel Lofty Dillingham, DO;  Location: [facility];  Service: Plastics;  Laterality: Right;  . IR FLUORO GUIDE CV LINE LEFT  04/30/2017  . IR US GUIDE VASC ACCESS LEFT  04/30/2017  . IRRIGATION AND DEBRIDEMENT KNEE Right 04/12/2016   Procedure: IRRIGATION AND DEBRIDEMENT KNEE;  Surgeon: Nicholes Stairs, MD;  [facility];  Service: Orthopedics;  Laterality: Right;  . IRRIGATION AND DEBRIDEMENT KNEE Right 12/16/2016   Procedure: Repeat irrigation and debridement right knee, wound closure  wound vac REPLACEMENT ANTIBIOTIC SPACERS;  Surgeon: Paralee Cancel, MD;  [facility]: [facility];  Service: Orthopedics;  Laterality: Right;  . KNEE ARTHROSCOPY  02/13/2012   Procedure: ARTHROSCOPY KNEE;  Surgeon: Johnn Hai, MD;  Location[facility];  Service: Orthopedics;  Laterality: Left;  WITH DEBRIDEMENt   . KNEE ARTHROSCOPY WITH LATERAL MENISECTOMY  02/13/2012   Procedure: KNEE ARTHROSCOPY WITH LATERAL MENISECTOMY;  Surgeon: Johnn Hai, MD;  Location: [facility];  Service: Orthopedics;;  partial  . LATISSIMUS FLAP RIGHT CHEST  02-23-2015   [facility]  . LEFT MODIFIED  RADICAL MASTECTOMY/ RIGHT TOTAL MASTECTOMY  11-13-2007   LEFT BREAST CANCER W/ AXILLARY LYMPH NODE METASTASIS AND POST NEOADJUVANT CHEMO  . MASS EXCISION Right 06/29/2017   Procedure: EXCISION OF METASTATIC BREAST CANCER TO SKIN RIGHT SHOULDER, PLACEMENT OF CELLERATE;  Surgeon: Wallace Going, DO;  [facility];  Service: Plastics;  Laterality: Right;  . PLACEMENT PORT-A-CATH  06/22/2007    new port placed 2015, right chest  . REIMPLANTATION OF TOTAL KNEE Right 04/27/2017   Procedure: Reimplantation of right total knee arthroplasty;  Surgeon: Paralee Cancel, MD;  [facility];  Service: Orthopedics;  Laterality: Right;  Adductor Block  . RESECTION OF ISOLATED CHEST WALL DISEASE/ ABDOMINAL SKIN FLAP  02/ 2015     [facility]   RIGHT CHEST WALL METASTATIC SKIN CARCINOMA  . SKIN SPLIT GRAFT Right 01/29/2017   Procedure: SKIN GRAFT SPLIT THICKNESS TO RIGHT KNEE WOUND;  Surgeon: Wallace Going, DO;  [facility];  Service: Plastics;  Laterality: Right;  . SKIN SPLIT GRAFT Right 09/07/2017   Procedure: SKIN GRAFT SPLIT THICKNESS TO RIGHT KNEE WOUND WITH PLACEMENT OF VAC DRESSING TO GRAFT SITE;  Surgeon: Wallace Going, DO;  Location: [facility];  Service: Plastics;  Laterality: Right;  . TOTAL KNEE ARTHROPLASTY Left 09/23/2012   Procedure: LEFT TOTAL KNEE ARTHROPLASTY;  Surgeon: Johnn Hai, MD;  [facility];  Service: Orthopedics;  Laterality: Left;  . TOTAL KNEE ARTHROPLASTY Right 03/20/2016   Procedure: RIGHT TOTAL KNEE ARTHROPLASTY;  Surgeon: Susa Day, MD;  [facility];  Service: Orthopedics;  Laterality: Right;  . TRANSTHORACIC ECHOCARDIOGRAM  04/24/2017   ef 37-62%, grade 1 diastolic dysfuction/  mild MR with mild prolapse anterior leaflet/ mild TR/ PASP 44mmHg    There were no vitals filed for this visit.  Subjective Assessment - 12/08/17 0937    Subjective  I got a little dizzy yesterday when I got out of our truck.  I think that I got  dehydrated.      Patient Stated Goals  improve standing and endurance    Currently in Pain?  Yes    Pain Score  2     Pain Location  Neck    Pain Orientation  Right    Pain Descriptors / Indicators  Dull;Aching    Pain Type  Chronic pain;Surgical pain    Pain Onset  More than a month ago    Pain Frequency  Intermittent    Aggravating Factors   sitting too long, night time    Pain Relieving Factors  ice                       [facility] Adult PT Treatment/Exercise - 12/08/17 0001      Knee/Hip Exercises: Aerobic   Nustep  L3 x 10 min PT present for status update and monitoring      Knee/Hip Exercises: Standing   Hip Abduction  Both;20 reps;Knee straight    Abduction Limitations  4#    Hip Extension  Both;2 sets;10 reps;Knee straight    Extension Limitations  4#    Rebounder  weight shifting 3 ways- 1 minute each    Walking with Sports Cord  --    Other Standing Knee Exercises  standing on black pad: static balance and then arm motions alternating 2x10, mini squats 2x10      Knee/Hip Exercises: Seated   Long Arc Quad  Strengthening;Both;3 sets;Weights    Long Arc Quad Weight  4 lbs.    Ball Squeeze  30x    Marching  Strengthening;Both;2 sets;15 reps;Weights 4#    Hamstring Curl  Strengthening;Both;2 sets;15 reps;Weights     Hamstring Weights  4 lbs.    Sit to Sand  2 sets;10 reps;without UE support               PT Short Term Goals - 11/04/17 0909      PT SHORT TERM GOAL #2   Title  Pt will be able to complete atleast 20 repetitions of SLR without extension lag, which will improve her safety with ambulation and other activity.     Time  3    Period  Weeks    Status  Achieved Can do 2x10      PT SHORT TERM GOAL #3   Title  Pt will demo Lt knee AROM flexion to atlast 100 deg which will help with technique during sit to stand.  Time  3    Period  Weeks    Status  Achieved 113 degrees      PT SHORT TERM GOAL #5   Title  Pt will demo proper  mechanics with sit to stand, without significant weight shift Lt and no more than 1 UE support for safety.     Time  3    Period  Weeks    Status  Achieved        PT Long Term Goals - 11/16/17 1018      PT LONG TERM GOAL #1   Title  independent with HEP    Time  6    Period  Weeks    Status  On-going    Target Date  12/28/17      PT LONG TERM GOAL #2   Title  demonstrate improved functional strength by performing 5x STS without UE support in < 15 sec    Baseline  16.4 seconds with moderate control    Time  6    Period  Weeks    Status  Revised    Target Date  12/28/17      PT LONG TERM GOAL #3   Title  improve functional endurance by performing 3MWT with at least 700'     Baseline  580 feet    Time  8    Period  Weeks    Status  On-going    Target Date  12/28/17      PT LONG TERM GOAL #4   Title  FOTO score improved to </= 40% limitation for improved function    Time  6    Period  Weeks    Status  On-going    Target Date  12/28/17      PT LONG TERM GOAL #5   Title  report pain < 4/10 with activity for improved mobility and function    Status  Achieved      PT LONG TERM GOAL #6   Title  The patient will have the stamina  to walk in the grocery store 10-15 minutes.    Baseline  use of cart- able to walk 15 minutes    Status  Achieved            Plan - 12/08/17 0949    Clinical Impression Statement  Pt with continued improvements in gait pattern with heel to toe progression today.  Pt continues to demonstrate wide base of support for stability.  PT is working to emphasize standing balance exercises and stability.  Pt requires stand by assistance for safety.  Pt will continue to benefit from skilled PT for LE strength, stability and endurance tasks.      Rehab Potential  Good    PT Frequency  2x / week    PT Duration  6 weeks    PT Treatment/Interventions  ADLs/Self Care Home Management;Cryotherapy;Moist Heat;Therapeutic exercise;Therapeutic  activities;Functional mobility training;Stair training;Gait training;Balance training;Neuromuscular re-education;Patient/family education;Manual techniques;Vasopneumatic Device;Taping;Passive range of motion    PT Next Visit Plan  LE strength, gait, balance and endurance    PT Home Exercise Plan  Access Code: [email or web address]     Recommended Other Services  recert signed    Consulted and Agree with Plan of Care  Patient       Patient will benefit from skilled therapeutic intervention in order to improve the following deficits and impairments:  Abnormal gait, Pain, Decreased strength, Decreased activity tolerance, Decreased balance, Decreased mobility, Decreased range of motion  Visit  Diagnosis: Acute pain of right knee  Stiffness of right knee, not elsewhere classified  Muscle weakness (generalized)  Other abnormalities of gait and mobility     Problem List Patient Active Problem List   Diagnosis Date Noted  . Wound, open with complication 62/95/2841  . Fever   . Anemia due to chemotherapy 05/07/2017  . Abnormal LFTs 05/07/2017  . History of breast cancer   . Bilateral leg edema   . Enterococcal infection   . S/P revision of total knee, right 04/27/2017  . Pyogenic arthritis of right knee joint ([place])   . Acute blood loss anemia 12/12/2016  . Right knee abx spacer 12/02/2016  . Physical exam 07/31/2016  . Allergic reaction 07/24/2016  . Drug rash 07/24/2016  . S/P total knee arthroplasty, right 05/31/2016  . 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 Nonhealing surgical wound 05/28/2016  . Sepsis ([place]) 05/10/2016  . UTI (urinary tract infection) 05/10/2016  . Cellulitis of right knee 05/10/2016  . Blood clot in vein   . Wound dehiscence, surgical 04/12/2016  . 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 Vitamin D deficiency 01/25/2016  . 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 Primary osteoarthritis of right knee 02/01/2015  . Symptomatic anemia 02/01/2015  . Frequent  UTI 01/12/2014  . Sinus tachycardia 11/03/2013  . SOB (shortness of breath) 11/03/2013  . Postoperative anemia due to acute blood loss 12/24/2012  .    09/23/2012  . Sinusitis 07/28/2012  . Neuropathy due to chemotherapeutic drug ([place]) 07/25/2011  . 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 Metastatic breast cancer ([place]) 11/12/2010  . Chronic anticoagulation 11/12/2010  . Anxiety, generalized 03/26/2008     Sigurd Sos, PT 12/08/17 10:03 AM  [facility] [address], [place], [place], [address] Phone: [phone]   Fax:  [phone]  Name: [name] MRN: [id number] Date of Birth: 1956-01-26

## 2017-12-09 ENCOUNTER — Ambulatory Visit: Payer: 59 | Admitting: Psychology

## 2017-12-10 ENCOUNTER — Ambulatory Visit: Payer: 59

## 2017-12-10 DIAGNOSIS — R2689 Other abnormalities of gait and mobility: Secondary | ICD-10-CM

## 2017-12-10 DIAGNOSIS — M25661 Stiffness of right knee, not elsewhere classified: Secondary | ICD-10-CM | POA: Diagnosis not present

## 2017-12-10 DIAGNOSIS — M6281 Muscle weakness (generalized): Secondary | ICD-10-CM | POA: Diagnosis not present

## 2017-12-10 DIAGNOSIS — R600 Localized edema: Secondary | ICD-10-CM

## 2017-12-10 DIAGNOSIS — M25612 Stiffness of left shoulder, not elsewhere classified: Secondary | ICD-10-CM | POA: Diagnosis not present

## 2017-12-10 DIAGNOSIS — M25561 Pain in right knee: Secondary | ICD-10-CM | POA: Diagnosis not present

## 2017-12-10 DIAGNOSIS — M25512 Pain in left shoulder: Secondary | ICD-10-CM | POA: Diagnosis not present

## 2017-12-10 MED FILL — GABAPENTIN 300 MG CAPSULE: 300 | 30 days supply | Qty: 90 | Fill #1

## 2017-12-10 NOTE — Therapy (Signed)
[facility] [address] [place], [place], [address] Phone: [phone]   Fax:  [phone]  Physical Therapy Treatment  Patient Details  Name: [name] MRN: [id number] Date of Birth: 08/09/1955 Referring Provider: Dr. Paralee Cancel   Encounter Date: 12/10/2017 Progress Note Reporting Period 10/28/17 to 12/10/17  See note below for Objective Data and Assessment of Progress/Goals.      PT End of Session - 12/10/17 1003    Visit Number  20    Number of Visits  36    Date for PT Re-Evaluation  12/28/17    Authorization Type  [facility] [employer] (prior auth required for secondary insurance); 24 visits total approved (9 used prior to 09/02/17).  12 additional approved    Authorization - Visit Number  29    Authorization - Number of Visits  36    PT Start Time  [phone]    PT Stop Time  1005    PT Time Calculation (min)  40 min    Activity Tolerance  Patient tolerated treatment well    Behavior During Therapy  WFL for tasks assessed/performed       Past Medical History:  Diagnosis Date  . Arthritis   . 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) followed by dr force -- oncolgoist w/ [place]   primary left breast cancer dx 01/ 2009 ; 11/ 2009 recurrence left chest wall and neck, tx clinical trial drug and chemotherapy;  2015 recurrence cutaneous metastatized to right skin/ chest wall, 02/ 2015 resection chest wall disease and 02-23-2015 s/p skin flap surgery,  chemo every 3 weeks  . 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 Heart murmur   . 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 MVP (mitral valve prolapse)    mild mvp w/ mild regurg. per last echo 04-24-2017  . Neuropathy due to chemotherapeutic drug ([place])   . Non-healing surgical wound    right knee post re-implantation total knee arthroplasty 04-2017 unable to completely close surgical incision  . PONV (postoperative nausea and vomiting)    ponv likes scopolamine patch  . Port-A-Cath in place    RIGHT CHEST   . Red blood cell antibody positive   . Skin changes related to chemotherapy    per patient she has scabbed over skin circumventing port to right upper chest ;  state it is due to chemptherapy   . 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 APPLICATION OF A-CELL OF BACK Right 07/31/2016   Procedure: CELLERATE COLLAGEN PLACEMENT;  Surgeon: Loel Lofty Dillingham, DO;  Location: [place];  Service: Plastics;  Laterality: Right;  . APPLICATION OF WOUND VAC Right 06/29/2017   Procedure: APPLICATION OF WOUND VAC;  Surgeon: Wallace Going, DO;  Location: [facility];  Service: Plastics;  Laterality: Right;  . DEBRIDEMENT CHEST WALL RIGHT/ VAC PLACEMENT  02-09-2015    [facility]  . EXCISIONAL TOTAL KNEE ARTHROPLASTY  WITH ANTIBIOTIC SPACERS Right 12/02/2016   Procedure: EXCISIONAL TOTAL KNEE ARTHROPLASTY WITH ANTIBIOTIC SPACERS;  Surgeon: Paralee Cancel, MD;  Location: [facility];  Service: Orthopedics;  Laterality: Right;  90 mins  . HEMATOMA EVACUATION Right 06/29/2017   Procedure: EVACUATION HEMATOMA OF RIGHT KNEE;  Surgeon: Wallace Going, DO;  Location: [facility];  Service: Plastics;  Laterality: Right;  . HEMATOMA EVACUATION Right 06/29/2017   Procedure: EVACUATION RIGHT TOTAL KNEE HEMATOMA;  Surgeon: Paralee Cancel, MD;  [facility];  Service: Orthopedics;  Laterality: Right;  . HEMATOMA EVACUATION Right 07/14/2017   Procedure: EVACUATION RIGHT LEG  HEMATOMA WITH APPLICATION OF WOUND VAC;  Surgeon: Paralee Cancel, MD;  [facility];  Service: Orthopedics;  Laterality: Right;  . hemotoma     evacuation left chest wall  . I&D EXTREMITY Right 07/17/2017   Procedure: EVACUATION RIGHT LEG HEMATOMA WITH WOUND VAC DRESSING CHANGE;  Surgeon: Paralee Cancel, MD;  [facility];  Service: Orthopedics;  Laterality: Right;  . I&D KNEE WITH POLY EXCHANGE Right 05/28/2016   Procedure: IRRIGATION AND DEBRIDEMENT KNEE WOUND VAC PLACMENT;  Surgeon: Susa Day, MD;  [facility];  Service: Orthopedics;  Laterality: Right;  . I&D KNEE WITH POLY EXCHANGE Right 05/30/2016   Procedure: RADICAL SYNOVECTOMY,IRRIGATION AND DEBRIDEMENT KNEE WITH POLY EXCHANGE WITH ANTIBIOTIC BEADS, APPLICATION OF WOUND VAC;  Surgeon: Susa Day, MD;  [facility];  Service: Orthopedics;  Laterality: Right;  . INCISION AND DRAINAGE OF WOUND Right 07/31/2016   Procedure: IRRIGATION AND DEBRIDEMENT RIGHT KNEE  WOUND;  Surgeon: Loel Lofty Dillingham, DO;  Location: [facility];  Service: Plastics;  Laterality: Right;  . IR FLUORO GUIDE CV LINE LEFT  04/30/2017  . IR US GUIDE VASC ACCESS LEFT  04/30/2017  . IRRIGATION AND DEBRIDEMENT KNEE Right 04/12/2016   Procedure: IRRIGATION AND DEBRIDEMENT KNEE;  Surgeon: Nicholes Stairs, MD;  [facility];  Service: Orthopedics;  Laterality: Right;  . IRRIGATION AND DEBRIDEMENT KNEE Right 12/16/2016   Procedure: Repeat irrigation and debridement right knee, wound closure  wound vac REPLACEMENT ANTIBIOTIC SPACERS;  Surgeon: Paralee Cancel, MD;  Location[facility];  Service: Orthopedics;  Laterality: Right;  . KNEE ARTHROSCOPY  02/13/2012   Procedure: ARTHROSCOPY KNEE;  Surgeon: Johnn Hai, MD;  Location: [facility];  Service: Orthopedics;  Laterality: Left;  WITH DEBRIDEMENt   . KNEE ARTHROSCOPY WITH LATERAL MENISECTOMY  02/13/2012   Procedure: KNEE ARTHROSCOPY WITH LATERAL MENISECTOMY;  Surgeon: Johnn Hai, MD;   Location: [facility];  Service: Orthopedics;;  partial  . LATISSIMUS FLAP RIGHT CHEST  02-23-2015   [facility]  . LEFT MODIFIED RADICAL MASTECTOMY/ RIGHT TOTAL MASTECTOMY  11-13-2007   LEFT BREAST CANCER W/ AXILLARY LYMPH NODE METASTASIS AND POST NEOADJUVANT CHEMO  . MASS EXCISION Right 06/29/2017   Procedure: EXCISION OF METASTATIC BREAST CANCER TO SKIN RIGHT SHOULDER, PLACEMENT OF CELLERATE;  Surgeon: Wallace Going, DO;  Location[facility];  Service: Plastics;  Laterality: Right;  . PLACEMENT PORT-A-CATH  06/22/2007    new port placed 2015, right chest  . REIMPLANTATION OF TOTAL KNEE Right 04/27/2017   Procedure: Reimplantation of right total knee arthroplasty;  Surgeon: Paralee Cancel, MD;  Location[facility];  Service: Orthopedics;  Laterality: Right;  Adductor Block  . RESECTION OF ISOLATED CHEST WALL DISEASE/ ABDOMINAL SKIN FLAP  02/ 2015     [facility]   RIGHT CHEST WALL METASTATIC SKIN CARCINOMA  . SKIN SPLIT GRAFT Right 01/29/2017   Procedure:  SKIN GRAFT SPLIT THICKNESS TO RIGHT KNEE WOUND;  Surgeon: Wallace Going, DO;  Location[facility];  Service: Plastics;  Laterality: Right;  . SKIN SPLIT GRAFT Right 09/07/2017   Procedure: SKIN GRAFT SPLIT THICKNESS TO RIGHT KNEE WOUND WITH PLACEMENT OF VAC DRESSING TO GRAFT SITE;  Surgeon: Wallace Going, DO;  Location: [facility];  Service: Plastics;  Laterality: Right;  . TOTAL KNEE ARTHROPLASTY Left 09/23/2012   Procedure: LEFT TOTAL KNEE ARTHROPLASTY;  Surgeon: Johnn Hai, MD;  Location[facility];  Service: Orthopedics;  Laterality: Left;  . TOTAL KNEE ARTHROPLASTY Right 03/20/2016   Procedure: RIGHT TOTAL KNEE ARTHROPLASTY;  Surgeon: Susa Day, MD;  Location[facility];  Service: Orthopedics;  Laterality: Right;  . TRANSTHORACIC ECHOCARDIOGRAM  04/24/2017   ef 16-10%, grade 1 diastolic dysfuction/  mild MR with mild prolapse anterior leaflet/ mild TR/ PASP 19mmHg    There were no vitals filed for this  visit.  Subjective Assessment - 12/10/17 0930    Subjective  No longer dizzy.      Patient Stated Goals  improve standing and endurance    Currently in Pain?  Yes    Pain Location  Knee    Pain Orientation  Right    Pain Descriptors / Indicators  Aching;Dull    Pain Onset  More than a month ago    Aggravating Factors   sitting too long, night time    Pain Relieving Factors  ice         [facility] PT Assessment - 12/10/17 0001      Assessment   Medical Diagnosis  Rt TKA reconstruction s/p I&D with wound vac placement x 2    Onset Date/Surgical Date  04/27/17      Observation/Other Assessments   Focus on Therapeutic Outcomes (FOTO)   40% limitaiton      Transfers   Five time sit to stand comments   16.24                   [facility] Adult PT Treatment/Exercise - 12/10/17 0001      Knee/Hip Exercises: Aerobic   Nustep  L3 x 10 min PT present for status update and monitoring      Knee/Hip Exercises: Standing   Knee Flexion  Both;20 reps;Limitations    Knee Flexion Limitations  3#    Hip Abduction  Both;20 reps;Knee straight    Abduction Limitations  4#    Hip Extension  Both;2 sets;10 reps;Knee straight    Extension Limitations  4#    Rebounder  weight shifting 3 ways- 1 minute each    Other Standing Knee Exercises  standing on black pad: static balance and then arm motions alternating 2x10, mini squats 2x10      Knee/Hip Exercises: Seated   Long Arc Quad  Strengthening;Both;3 sets;Weights    Long Arc Quad Weight  4 lbs.    Ball Squeeze  30x    Marching  Strengthening;Both;2 sets;15 reps;Weights 4#    Sit to [employer]  2 sets;10 reps;without UE support               PT Short Term Goals - 11/04/17 0909      PT SHORT TERM GOAL #2   Title  Pt will be able to complete atleast 20 repetitions of SLR without extension lag, which will improve her safety with ambulation and other activity.     Time  3    Period  Weeks  Status  Achieved Can do 2x10      PT SHORT  TERM GOAL #3   Title  Pt will demo Lt knee AROM flexion to atlast 100 deg which will help with technique during sit to stand.     Time  3    Period  Weeks    Status  Achieved 113 degrees      PT SHORT TERM GOAL #5   Title  Pt will demo proper mechanics with sit to stand, without significant weight shift Lt and no more than 1 UE support for safety.     Time  3    Period  Weeks    Status  Achieved        PT Long Term Goals - 12/10/17 0936      PT LONG TERM GOAL #2   Title  demonstrate improved functional strength by performing 5x STS without UE support in < 15 sec    Baseline  16.24 seconds with moderate control    Time  6    Period  Weeks    Status  On-going      PT LONG TERM GOAL #4   Title  FOTO score improved to </= 40% limitation for improved function    Baseline  40% limitation    Status  Achieved      PT LONG TERM GOAL #5   Title  report pain < 4/10 with activity for improved mobility and function    Status  Achieved      PT LONG TERM GOAL #6   Title  The patient will have the stamina  to walk in the grocery store 10-15 minutes.    Baseline  use of cart- able to walk 15 minutes    Status  Achieved            Plan - 12/10/17 0948    Clinical Impression Statement  Pt is making steady progress regarding strength and mobility.  Pt has complex medical condition inclulding metastatic breast cancer with treatment and signficiant complications after total knee replacement including revision, infection and hematoma.  Pt with improved FOTO score to 40% limitation, 5x sit to stand is 16.24 seconds with moderate control and reduced use of UEs.  Pt demonstrates a more narrow base of support and reduced trunk compensation although base of support is still wide.  Pt is able to grocery shop for 20 minutes without limitaiton now.  Pt will continue to benefit from skilled PT for LE strength, gait, balance, proprioception and endurance.      Rehab Potential  Good    PT Frequency  2x  / week    PT Duration  6 weeks    PT Treatment/Interventions  ADLs/Self Care Home Management;Cryotherapy;Moist Heat;Therapeutic exercise;Therapeutic activities;Functional mobility training;Stair training;Gait training;Balance training;Neuromuscular re-education;Patient/family education;Manual techniques;Vasopneumatic Device;Taping;Passive range of motion    PT Next Visit Plan  LE strength, gait, balance and endurance    PT Home Exercise Plan  Access Code: [email or web address]     Consulted and Agree with Plan of Care  Patient       Patient will benefit from skilled therapeutic intervention in order to improve the following deficits and impairments:  Abnormal gait, Pain, Decreased strength, Decreased activity tolerance, Decreased balance, Decreased mobility, Decreased range of motion  Visit Diagnosis: Acute pain of right knee  Stiffness of right knee, not elsewhere classified  Muscle weakness (generalized)  Other abnormalities of gait and mobility  Localized edema     Problem List Patient  Active Problem List   Diagnosis Date Noted  . Wound, open with complication 68/37/2902  . Fever   . Anemia due to chemotherapy 05/07/2017  . Abnormal LFTs 05/07/2017  . History of breast cancer   . Bilateral leg edema   . Enterococcal infection   . S/P revision of total knee, right 04/27/2017  . Pyogenic arthritis of right knee joint ([place])   . Acute blood loss anemia 12/12/2016  . Right knee abx spacer 12/02/2016  . Physical exam 07/31/2016  . Allergic reaction 07/24/2016  . Drug rash 07/24/2016  . S/P total knee arthroplasty, right 05/31/2016  . 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 Nonhealing surgical wound 05/28/2016  . Sepsis ([place]) 05/10/2016  . UTI (urinary tract infection) 05/10/2016  . Cellulitis of right knee 05/10/2016  . Blood clot in vein   . Wound dehiscence, surgical 04/12/2016  . 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 Vitamin D deficiency 01/25/2016  . Metastatic cancer to  chest wall ([place]) 02/02/2015  . Primary osteoarthritis of right knee 02/01/2015  . Symptomatic anemia 02/01/2015  . Frequent UTI 01/12/2014  . Sinus tachycardia 11/03/2013  . SOB (shortness of breath) 11/03/2013  . Postoperative anemia due to acute blood loss 12/24/2012  .    09/23/2012  . Sinusitis 07/28/2012  . Neuropathy due to chemotherapeutic drug ([place]) 07/25/2011  . 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 Metastatic breast cancer ([place]) 11/12/2010  . Chronic anticoagulation 11/12/2010  . Anxiety, generalized 03/26/2008     Sigurd Sos, PT 12/10/17 10:07 AM  [facility] [address], [place], [place], [address] Phone: [phone]   Fax:  [phone]  Name: GLENICE CICCONE MRN: [id number] Date of Birth: November 08, 1955

## 2017-12-14 ENCOUNTER — Ambulatory Visit: Payer: 59 | Admitting: Physical Therapy

## 2017-12-14 ENCOUNTER — Encounter: Payer: Self-pay | Admitting: Physical Therapy

## 2017-12-14 DIAGNOSIS — M25612 Stiffness of left shoulder, not elsewhere classified: Secondary | ICD-10-CM | POA: Diagnosis not present

## 2017-12-14 DIAGNOSIS — R2689 Other abnormalities of gait and mobility: Secondary | ICD-10-CM | POA: Diagnosis not present

## 2017-12-14 DIAGNOSIS — C50412 Malignant neoplasm of upper-outer quadrant of left female breast: Secondary | ICD-10-CM | POA: Diagnosis not present

## 2017-12-14 DIAGNOSIS — R590 Localized enlarged lymph nodes: Secondary | ICD-10-CM | POA: Diagnosis not present

## 2017-12-14 DIAGNOSIS — R600 Localized edema: Secondary | ICD-10-CM | POA: Diagnosis not present

## 2017-12-14 DIAGNOSIS — M25561 Pain in right knee: Secondary | ICD-10-CM | POA: Diagnosis not present

## 2017-12-14 DIAGNOSIS — M25512 Pain in left shoulder: Secondary | ICD-10-CM | POA: Diagnosis not present

## 2017-12-14 DIAGNOSIS — M25661 Stiffness of right knee, not elsewhere classified: Secondary | ICD-10-CM

## 2017-12-14 DIAGNOSIS — M6281 Muscle weakness (generalized): Secondary | ICD-10-CM | POA: Diagnosis not present

## 2017-12-14 DIAGNOSIS — J3801 Paralysis of vocal cords and larynx, unilateral: Secondary | ICD-10-CM | POA: Diagnosis not present

## 2017-12-14 DIAGNOSIS — Z96651 Presence of right artificial knee joint: Secondary | ICD-10-CM | POA: Diagnosis not present

## 2017-12-14 NOTE — Therapy (Signed)
[facility] [address] [place], [place], [address] Phone: [phone]   Fax:  [phone]  Physical Therapy Treatment  Patient Details  Name: [name] MRN: [id number] Date of Birth: [birth year] Referring Provider: Dr. Paralee Cancel   Encounter Date: 12/14/2017  PT End of Session - 12/14/17 0929    Visit Number  21    Number of Visits  36    Date for PT Re-Evaluation  12/28/17    Authorization Type  [facility] UMR/UHC (prior auth required for secondary insurance); 24 visits total approved (9 used prior to 09/02/17).  12 additional approved    Authorization - Number of Visits  36    PT Start Time  0920    PT Stop Time  1000    PT Time Calculation (min)  40 min    Activity Tolerance  Patient tolerated treatment well    Behavior During Therapy  WFL for tasks assessed/performed       Past Medical History:  Diagnosis Date  . Arthritis   . 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 Heart murmur   . 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 MVP (mitral valve prolapse)    mild mvp w/ mild regurg. per last echo 04-24-2017  . Neuropathy due to chemotherapeutic drug ([place])   . Non-healing surgical  wound    right knee post re-implantation total knee arthroplasty 04-2017 unable to completely close surgical incision  . PONV (postoperative nausea and vomiting)    ponv likes scopolamine patch  . Port-A-Cath in place    RIGHT CHEST   . Red blood cell antibody positive   . Skin changes related to chemotherapy    per patient she has scabbed over skin circumventing port to right upper chest ;  state it is due to chemptherapy   . 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 APPLICATION OF A-CELL OF BACK Right 07/31/2016   Procedure: CELLERATE COLLAGEN PLACEMENT;  Surgeon: Loel Lofty Dillingham, DO;  Location: [place];  Service: Plastics;  Laterality: Right;  . APPLICATION OF WOUND VAC Right 06/29/2017   Procedure: APPLICATION OF WOUND VAC;  Surgeon: Wallace Going, DO;  Location: [facility];  Service: Plastics;  Laterality: Right;  . DEBRIDEMENT CHEST WALL RIGHT/ VAC PLACEMENT  02-09-2015    [facility]  . EXCISIONAL TOTAL KNEE ARTHROPLASTY WITH ANTIBIOTIC SPACERS Right 12/02/2016   Procedure: EXCISIONAL TOTAL KNEE ARTHROPLASTY WITH ANTIBIOTIC SPACERS;  Surgeon: Paralee Cancel, MD;  [facility];  Service: Orthopedics;  Laterality: Right;  90 mins  . HEMATOMA EVACUATION Right 06/29/2017   Procedure: EVACUATION HEMATOMA OF RIGHT KNEE;  Surgeon: Wallace Going, DO;  [facility];  Service: Plastics;  Laterality: Right;  . HEMATOMA EVACUATION Right 06/29/2017   Procedure: EVACUATION RIGHT TOTAL KNEE HEMATOMA;  Surgeon: Paralee Cancel, MD;  [facility];  Service: Orthopedics;  Laterality: Right;  . HEMATOMA EVACUATION Right 07/14/2017   Procedure: EVACUATION RIGHT LEG HEMATOMA WITH APPLICATION OF WOUND VAC;  Surgeon: Paralee Cancel, MD;  [facility];  Service: Orthopedics;  Laterality: Right;  . hemotoma     evacuation left chest  wall  . I&D EXTREMITY Right 07/17/2017   Procedure: EVACUATION RIGHT LEG HEMATOMA WITH WOUND VAC DRESSING CHANGE;  Surgeon: Paralee Cancel, MD;  [facility];  Service: Orthopedics;  Laterality: Right;  . I&D KNEE WITH POLY EXCHANGE Right 05/28/2016   Procedure: IRRIGATION AND DEBRIDEMENT KNEE WOUND VAC PLACMENT;  Surgeon: Susa Day, MD;  [facility];  Service: Orthopedics;  Laterality: Right;  . I&D KNEE WITH POLY EXCHANGE Right 05/30/2016   Procedure: RADICAL SYNOVECTOMY,IRRIGATION AND DEBRIDEMENT KNEE WITH POLY EXCHANGE WITH ANTIBIOTIC BEADS, APPLICATION OF WOUND VAC;  Surgeon: Susa Day, MD;  [facility];  Service: Orthopedics;  Laterality: Right;  . INCISION AND DRAINAGE OF WOUND Right 07/31/2016   Procedure: IRRIGATION AND DEBRIDEMENT RIGHT KNEE  WOUND;  Surgeon: Loel Lofty Dillingham, DO;  Location: [place];  Service: Plastics;  Laterality: Right;  . IR FLUORO GUIDE CV LINE LEFT  04/30/2017  . IR US GUIDE VASC ACCESS LEFT  04/30/2017  . IRRIGATION AND DEBRIDEMENT KNEE Right 04/12/2016   Procedure: IRRIGATION AND DEBRIDEMENT KNEE;  Surgeon: Nicholes Stairs, MD;  [facility];  Service: Orthopedics;  Laterality: Right;  . IRRIGATION AND DEBRIDEMENT KNEE Right 12/16/2016   Procedure: Repeat irrigation and debridement right knee, wound closure  wound vac REPLACEMENT ANTIBIOTIC SPACERS;  Surgeon: Paralee Cancel, MD;  [facility];  Service: Orthopedics;  Laterality: Right;  . KNEE ARTHROSCOPY  02/13/2012   Procedure: ARTHROSCOPY KNEE;  Surgeon: Johnn Hai, MD;  Location: [facility];  Service: Orthopedics;  Laterality: Left;  WITH DEBRIDEMENt   . KNEE ARTHROSCOPY WITH LATERAL MENISECTOMY  02/13/2012   Procedure: KNEE ARTHROSCOPY WITH LATERAL MENISECTOMY;  Surgeon: Johnn Hai, MD;  Location: [facility];  Service: Orthopedics;;  partial  . LATISSIMUS FLAP RIGHT CHEST  02-23-2015   [facility]  . LEFT MODIFIED RADICAL MASTECTOMY/ RIGHT TOTAL  MASTECTOMY  11-13-2007   LEFT BREAST CANCER W/ AXILLARY LYMPH NODE METASTASIS AND POST NEOADJUVANT CHEMO  . MASS EXCISION Right 06/29/2017   Procedure: EXCISION OF METASTATIC BREAST CANCER TO SKIN RIGHT SHOULDER, PLACEMENT OF CELLERATE;  Surgeon: Wallace Going, DO;  [facility];  Service: Plastics;  Laterality: Right;  . PLACEMENT PORT-A-CATH  06/22/2007    new port placed 2015, right chest  . REIMPLANTATION OF TOTAL KNEE Right 04/27/2017   Procedure: Reimplantation of right total knee arthroplasty;  Surgeon: Paralee Cancel, MD;  [facility];  Service: Orthopedics;  Laterality: Right;  Adductor Block  . RESECTION OF ISOLATED CHEST WALL DISEASE/ ABDOMINAL SKIN FLAP  02/ 2015     [facility]   RIGHT CHEST WALL METASTATIC SKIN CARCINOMA  . SKIN SPLIT GRAFT Right 01/29/2017   Procedure: SKIN GRAFT SPLIT THICKNESS TO RIGHT KNEE WOUND;  Surgeon: Wallace Going, DO;  [facility];  Service: Plastics;  Laterality: Right;  . SKIN SPLIT GRAFT Right 09/07/2017  Procedure: SKIN GRAFT SPLIT THICKNESS TO RIGHT KNEE WOUND WITH PLACEMENT OF VAC DRESSING TO GRAFT SITE;  Surgeon: Wallace Going, DO;  Location: [place];  Service: Plastics;  Laterality: Right;  . TOTAL KNEE ARTHROPLASTY Left 09/23/2012   Procedure: LEFT TOTAL KNEE ARTHROPLASTY;  Surgeon: Johnn Hai, MD;  [facility];  Service: Orthopedics;  Laterality: Left;  . TOTAL KNEE ARTHROPLASTY Right 03/20/2016   Procedure: RIGHT TOTAL KNEE ARTHROPLASTY;  Surgeon: Susa Day, MD;  [facility];  Service: Orthopedics;  Laterality: Right;  . TRANSTHORACIC ECHOCARDIOGRAM  04/24/2017   ef 43-32%, grade 1 diastolic dysfuction/  mild MR with mild prolapse anterior leaflet/ mild TR/ PASP 70mmHg    There were no vitals filed for this visit.  Subjective Assessment - 12/14/17 0930    Subjective  No pain this AM just knee stiffness.    Limitations  Standing;Walking    Currently in Pain?  No/denies    Multiple  Pain Sites  No                       [facility] Adult PT Treatment/Exercise - 12/14/17 0001      Knee/Hip Exercises: Aerobic   Nustep  L3 x 15 min      Knee/Hip Exercises: Standing   Hip Abduction  Both;20 reps;Knee straight    Abduction Limitations  4#    Hip Extension  Both;2 sets;10 reps;Knee straight    Extension Limitations  4#    Other Standing Knee Exercises  standing on black pad, 20x marching, 2x 15 squats VC to sit hips abck towards chair more      Knee/Hip Exercises: Seated   Long Arc Quad  Strengthening;Both;3 sets;Weights    Long Arc Quad Weight  4 lbs.    Ball Squeeze  15 x fast, 15 slow    Hamstring Curl  Strengthening;Both;2 sets;10 reps;Weights standing on black pad     Hamstring Weights  4 lbs.    Sit to Sand  2 sets;10 reps;without UE support Ascent fast, descend slow VC               PT Short Term Goals - 11/04/17 0909      PT SHORT TERM GOAL #2   Title  Pt will be able to complete atleast 20 repetitions of SLR without extension lag, which will improve her safety with ambulation and other activity.     Time  3    Period  Weeks    Status  Achieved Can do 2x10      PT SHORT TERM GOAL #3   Title  Pt will demo Lt knee AROM flexion to atlast 100 deg which will help with technique during sit to stand.     Time  3    Period  Weeks    Status  Achieved 113 degrees      PT SHORT TERM GOAL #5   Title  Pt will demo proper mechanics with sit to stand, without significant weight shift Lt and no more than 1 UE support for safety.     Time  3    Period  Weeks    Status  Achieved        PT Long Term Goals - 12/10/17 0936      PT LONG TERM GOAL #2   Title  demonstrate improved functional strength by performing 5x STS without UE support in < 15 sec    Baseline  16.24 seconds  with moderate control    Time  6    Period  Weeks    Status  On-going      PT LONG TERM GOAL #4   Title  FOTO score improved to </= 40% limitation for improved  function    Baseline  40% limitation    Status  Achieved      PT LONG TERM GOAL #5   Title  report pain < 4/10 with activity for improved mobility and function    Status  Achieved      PT LONG TERM GOAL #6   Title  The patient will have the stamina  to walk in the grocery store 10-15 minutes.    Baseline  use of cart- able to walk 15 minutes    Status  Achieved            Plan - 12/14/17 0929    Clinical Impression Statement  Pt still able to get out and do errands, but the humidity lately has been so oppressive that it wears her out. Pt demonstrates improved control of her LE during her strengthening exercises today. She was challenged with speed changes today  during a few exercises.     Rehab Potential  Good    PT Frequency  2x / week    PT Duration  6 weeks    PT Treatment/Interventions  ADLs/Self Care Home Management;Cryotherapy;Moist Heat;Therapeutic exercise;Therapeutic activities;Functional mobility training;Stair training;Gait training;Balance training;Neuromuscular re-education;Patient/family education;Manual techniques;Vasopneumatic Device;Taping;Passive range of motion    PT Next Visit Plan  LE strength, gait, balance and endurance    PT Home Exercise Plan  Access Code: [email or web address]     Consulted and Agree with Plan of Care  Patient       Patient will benefit from skilled therapeutic intervention in order to improve the following deficits and impairments:  Abnormal gait, Pain, Decreased strength, Decreased activity tolerance, Decreased balance, Decreased mobility, Decreased range of motion  Visit Diagnosis: Acute pain of right knee  Stiffness of right knee, not elsewhere classified  Muscle weakness (generalized)  Other abnormalities of gait and mobility  Localized edema     Problem List Patient Active Problem List   Diagnosis Date Noted  . Wound, open with complication 16/02/9603  . Fever   . Anemia due to chemotherapy 05/07/2017  . Abnormal LFTs  05/07/2017  . History of breast cancer   . Bilateral leg edema   . Enterococcal infection   . S/P revision of total knee, right 04/27/2017  . Pyogenic arthritis of right knee joint ([place])   . Acute blood loss anemia 12/12/2016  . Right knee abx spacer 12/02/2016  . Physical exam 07/31/2016  . Allergic reaction 07/24/2016  . Drug rash 07/24/2016  . S/P total knee arthroplasty, right 05/31/2016  . 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 Nonhealing surgical wound 05/28/2016  . Sepsis ([place]) 05/10/2016  . UTI (urinary tract infection) 05/10/2016  . Cellulitis of right knee 05/10/2016  . Blood clot in vein   . Wound dehiscence, surgical 04/12/2016  . 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 Vitamin D deficiency 01/25/2016  . 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 Primary osteoarthritis of right knee 02/01/2015  . Symptomatic anemia 02/01/2015  . Frequent UTI 01/12/2014  . Sinus tachycardia 11/03/2013  . SOB (shortness of breath) 11/03/2013  . Postoperative anemia due to acute blood loss 12/24/2012  .    09/23/2012  . Sinusitis 07/28/2012  . Neuropathy due to chemotherapeutic drug ([place])  07/25/2011  . 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 Metastatic breast cancer ([place]) 11/12/2010  . Chronic anticoagulation 11/12/2010  . Anxiety, generalized 03/26/2008    Yahmir Sokolov, PTA 12/14/2017, 9:58 AM  [facility] [address] [place], [place], [address] Phone: [phone]   Fax:  [phone]  Name: [name] MRN: [id number] Date of Birth: 05/12/1956

## 2017-12-15 ENCOUNTER — Ambulatory Visit (INDEPENDENT_AMBULATORY_CARE_PROVIDER_SITE_OTHER): Payer: 59 | Admitting: Sports Medicine

## 2017-12-15 ENCOUNTER — Encounter: Payer: Self-pay | Admitting: Sports Medicine

## 2017-12-15 ENCOUNTER — Ambulatory Visit: Payer: Self-pay

## 2017-12-15 ENCOUNTER — Ambulatory Visit (INDEPENDENT_AMBULATORY_CARE_PROVIDER_SITE_OTHER): Payer: 59

## 2017-12-15 VITALS — BP 102/70 | HR 99 | Ht 67.0 in | Wt 170.4 lb

## 2017-12-15 DIAGNOSIS — M25512 Pain in left shoulder: Secondary | ICD-10-CM

## 2017-12-15 DIAGNOSIS — M7502 Adhesive capsulitis of left shoulder: Secondary | ICD-10-CM | POA: Diagnosis not present

## 2017-12-15 DIAGNOSIS — G8929 Other chronic pain: Secondary | ICD-10-CM

## 2017-12-15 NOTE — Procedures (Signed)
PROCEDURE NOTE:  Ultrasound Guided: Injection: Left shoulder Images were obtained and interpreted by myself, Teresa Coombs, DO  Images have been saved and stored to PACS system. Images obtained on: GE S7 Ultrasound machine    ULTRASOUND FINDINGS:  Minimal degenerative spurring, mild thickening of the biceps tendon sheath with normal-appearing biceps tendon and mild thickening of the supraspinatus  DESCRIPTION OF PROCEDURE:  The patient's clinical condition is marked by substantial pain and/or significant functional disability. Other conservative therapy has not provided relief, is contraindicated, or not appropriate. There is a reasonable likelihood that injection will significantly improve the patient's pain and/or functional impairment.   After discussing the risks, benefits and expected outcomes of the injection and all questions were reviewed and answered, the patient wished to undergo the above named procedure.  Verbal consent was obtained.  The ultrasound was used to identify the target structure and adjacent neurovascular structures. The skin was then prepped in sterile fashion and the target structure was injected under direct visualization using sterile technique as below:  Single injection performed as below: PREP: Alcohol and Ethel Chloride APPROACH:posterior, single injection, 21g 2 in. INJECTATE: 2 cc 0.5% Marcaine and 2 cc 40mg /mL DepoMedrol ASPIRATE: None DRESSING: Band-Aid  Post procedural instructions including recommending icing and warning signs for infection were reviewed.    This procedure was well tolerated and there were no complications.   IMPRESSION: Succesful Ultrasound Guided: Injection

## 2017-12-15 NOTE — Progress Notes (Signed)
I have sent a note to [name], nurse practitioner with [facility] at her oncology clinic to let them know of these findings.  I have a low suspicion for this but she does have a CT scan already scheduled of her chest coming up and this should further evaluate the shoulder complex.  These changes can be seen for multiple reasons and given her stable lab work recently that I reviewed per care everywhere, I have a low suspicion for significant metastatic disease.

## 2017-12-15 NOTE — Patient Instructions (Addendum)
Please perform the exercise program that we have prepared for you and gone over in detail on a daily basis.  In addition to the handout you were provided you can access your program through: [email or web address]   Your unique program code is: [email or web address]   You had an injection today.  Things to be aware of after injection are listed below: . You may experience no significant improvement or even a slight worsening in your symptoms during the first 24 to 48 hours.  After that we expect your symptoms to improve gradually over the next 2 weeks for the medicine to have its maximal effect.  You should continue to have improvement out to 6 weeks after your injection. . Dr. Paulla Fore recommends icing the site of the injection for 20 minutes  1-2 times the day of your injection . You may shower but no swimming, tub bath or Jacuzzi for 24 hours. . If your bandage falls off this does not need to be replaced.  It is appropriate to remove the bandage after 4 hours. . You may resume light activities as tolerated unless otherwise directed per Dr. Paulla Fore during your visit  POSSIBLE STEROID SIDE EFFECTS:  Side effects from injectable steroids tend to be less than when taken orally however you may experience some of the symptoms listed below.  If experienced these should only last for a short period of time. Change in menstrual flow  Edema (swelling)  Increased appetite Skin flushing (redness)  Skin rash/acne  Thrush (oral) Yeast vaginitis    Increased sweating  Depression Increased blood glucose levels Cramping and leg/calf  Euphoria (feeling happy)  POSSIBLE PROCEDURE SIDE EFFECTS: The side effects of the injection are usually fairly minimal however if you may experience some of the following side effects that are usually self-limited and will is off on their own.  If you are concerned please feel free to call the office with questions:  Increased numbness or tingling  Nausea or vomiting  Swelling or bruising at  the injection site   Please call our office if if you experience any of the following symptoms over the next week as these can be signs of infection:   Fever greater than 100.35F  Significant swelling at the injection site  Significant redness or drainage from the injection site  If after 2 weeks you are continuing to have worsening symptoms please call our office to discuss what the next appropriate actions should be including the potential for a return office visit or other diagnostic testing.

## 2017-12-15 NOTE — Progress Notes (Signed)
[name]. [name], [place] [facility] [place] - 62 y.o. female MRN [id number]  Date of birth: 19-Sep-1955  Visit Date: 12/15/2017  PCP: Midge Minium, MD   Referred by: Midge Minium, MD  Scribe(s) for today's visit: Wendy Poet, LAT, ATC  SUBJECTIVE:  [name] is here for New Patient (Initial Visit) (shoulder pain) .    Her L shoulder pain symptoms INITIALLY: Began about 3 months ago w/ no known MOI.  She reports that her L shoulder will get "stuck" at times with horiz aDd but also reports being able to "unstick" her L shoulder by moving into L shoulder extension and horiz aBd.  She also notes having spasms in her L upper arm. Described as mild brief, sharp pain, nonradiating Worsened with horiz aDd Improved with getting her arm out of the irritating position Additional associated symptoms include: residual N/T in L UE after L mastectomy in 2009; no mechanical symptoms noted    At this time symptoms are worsening compared to onset  She has not been doing anything in particular for her L shoulder.  Her PT that she is seeing for her R knee recommended that she get her L shoulder looked at.   REVIEW OF SYSTEMS: Denies night time disturbances. Denies fevers, chills, or night sweats. Denies unexplained weight loss. Reports personal history of cancer.  B breast cancer and B mastectomies. Denies changes in bowel or bladder habits. Denies recent unreported falls. Denies new or worsening dyspnea or wheezing. Denies headaches or dizziness.  Reports numbness, tingling or weakness  In the extremities - in the L UE residual from CA treatment Denies dizziness or presyncopal episodes Denies lower extremity edema    HISTORY & PERTINENT PRIOR DATA:  Prior History reviewed and updated per electronic medical record.  Significant/pertinent history, findings, studies include:  reports that she has never smoked.  She has never used smokeless tobacco. No results for input(s): HGBA1C, LABURIC, CREATINE in the last 8760 hours. No specialty comments available. No problems updated.  OBJECTIVE:  VS:  HT:5\' 7"  (170.2 cm)   WT:170 lb 6.4 oz (77.3 kg)  BMI:26.68    BP:102/70  HR:99bpm  TEMP: ( )  RESP:96 %   PHYSICAL EXAM: Constitutional: WDWN, Non-toxic appearing. Psychiatric: Alert & appropriately interactive.  Not depressed or anxious appearing. Respiratory: No increased work of breathing.  Trachea Midline Eyes: Pupils are equal.  EOM intact without nystagmus.  No scleral icterus  Vascular Exam: warm to touch no edema  upper extremity neuro exam: unremarkable normal strength normal sensation  MSK Exam: Left shoulder is held in slight protraction.  Limited overhead range of motion as well as limb arc held at 45 degrees of abduction to only 80 to 90 degrees.  She has pain with axial load and circumduction.  Internal rotation strength is normal.  External rotation strength is 4 out of 5.  He can testing is painful and 4 out of 5 strength with this.  Minimal pain with speeds testing and O'Brien's testing.   ASSESSMENT & PLAN:   1. Chronic left shoulder pain   2. Adhesive capsulitis of left shoulder     PLAN: Ultrasound-guided intra-articular injection performed today.  She will follow-up in 4 weeks to consider advancing her therapeutic exercises.  Could consider repeat injection for potential adhesive capsulitis but will check on her progress at that time.  Discussed the foundation of treatment for this condition  is physical therapy and/or daily (5-6 days/week) therapeutic exercises, focusing on core strengthening, coordination, neuromuscular control/reeducation.  Therapeutic exercises prescribed per procedure note.  PROCEDURE NOTE: THERAPEUTIC EXERCISES (97110) 15 minutes spent for Therapeutic exercises as below and as referenced in the AVS.  This included exercises focusing on stretching,  strengthening, with significant focus on eccentric aspects.   Proper technique shown and discussed handout in great detail with ATC.  All questions were discussed and answered.   Long term goals include an improvement in range of motion, strength, endurance as well as avoiding reinjury. Frequency of visits is one time as determined during today's  office visit. Frequency of exercises to be performed is as per handout.  EXERCISES REVIEWED:  [place] Exercises  Follow-up: Return in about 1 month (around 01/12/2018) for advancing Therapeutic exercises.      Please see additional documentation for Objective, Assessment and Plan sections. Pertinent additional documentation may be included in corresponding procedure notes, imaging studies, problem based documentation and patient instructions. Please see these sections of the encounter for additional information regarding this visit.  CMA/ATC served as Education administrator during this visit. History, Physical, and Plan performed by medical provider. Documentation and orders reviewed and attested to.      Gerda Diss, [facility] Physician

## 2017-12-16 ENCOUNTER — Ambulatory Visit (INDEPENDENT_AMBULATORY_CARE_PROVIDER_SITE_OTHER): Payer: 59 | Admitting: Psychology

## 2017-12-16 DIAGNOSIS — F4323 Adjustment disorder with mixed anxiety and depressed mood: Secondary | ICD-10-CM | POA: Diagnosis not present

## 2017-12-17 ENCOUNTER — Ambulatory Visit: Payer: 59

## 2017-12-17 DIAGNOSIS — M25661 Stiffness of right knee, not elsewhere classified: Secondary | ICD-10-CM | POA: Diagnosis not present

## 2017-12-17 DIAGNOSIS — M25561 Pain in right knee: Secondary | ICD-10-CM

## 2017-12-17 DIAGNOSIS — M25612 Stiffness of left shoulder, not elsewhere classified: Secondary | ICD-10-CM | POA: Diagnosis not present

## 2017-12-17 DIAGNOSIS — R2689 Other abnormalities of gait and mobility: Secondary | ICD-10-CM

## 2017-12-17 DIAGNOSIS — R600 Localized edema: Secondary | ICD-10-CM | POA: Diagnosis not present

## 2017-12-17 DIAGNOSIS — M25512 Pain in left shoulder: Secondary | ICD-10-CM | POA: Diagnosis not present

## 2017-12-17 DIAGNOSIS — M6281 Muscle weakness (generalized): Secondary | ICD-10-CM | POA: Diagnosis not present

## 2017-12-17 NOTE — Therapy (Signed)
[facility] [address] [place], [place], [address] Phone: [phone]   Fax:  [phone]  Physical Therapy Treatment  Patient Details  Name: [name] MRN: [id number] Date of Birth: 1956/05/26 Referring Provider: Dr. Paralee Cancel   Encounter Date: 12/17/2017  PT End of Session - 12/17/17 1014    Visit Number  22    Number of Visits  36    Date for PT Re-Evaluation  12/28/17    Authorization Type  [facility] UMR/UHC (prior auth required for secondary insurance); 24 visits total approved (9 used prior to 09/02/17).  12 additional approved    Authorization - Visit Number  30    Authorization - Number of Visits  36    PT Start Time  [phone]    PT Stop Time  1008    PT Time Calculation (min)  46 min    Activity Tolerance  Patient tolerated treatment well    Behavior During Therapy  WFL for tasks assessed/performed       Past Medical History:  Diagnosis Date  . Arthritis   . 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 Heart murmur   . 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 MVP (mitral valve prolapse)    mild mvp w/ mild regurg. per last echo 04-24-2017  . Neuropathy due to chemotherapeutic  drug ([place])   . Non-healing surgical wound    right knee post re-implantation total knee arthroplasty 04-2017 unable to completely close surgical incision  . PONV (postoperative nausea and vomiting)    ponv likes scopolamine patch  . Port-A-Cath in place    RIGHT CHEST   . Red blood cell antibody positive   . Skin changes related to chemotherapy    per patient she has scabbed over skin circumventing port to right upper chest ;  state it is due to chemptherapy   . 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 APPLICATION OF A-CELL OF BACK Right 07/31/2016   Procedure: CELLERATE COLLAGEN PLACEMENT;  Surgeon: Loel Lofty Dillingham, DO;  Location: [place];  Service: Plastics;  Laterality: Right;  . APPLICATION OF WOUND VAC Right 06/29/2017   Procedure: APPLICATION OF WOUND VAC;  Surgeon: Wallace Going, DO;  Location: [facility];  Service: Plastics;  Laterality: Right;  . DEBRIDEMENT CHEST WALL RIGHT/ VAC PLACEMENT  02-09-2015    [facility]  . EXCISIONAL TOTAL KNEE ARTHROPLASTY WITH ANTIBIOTIC SPACERS Right 12/02/2016   Procedure: EXCISIONAL TOTAL KNEE ARTHROPLASTY WITH ANTIBIOTIC SPACERS;  Surgeon: Paralee Cancel, MD;  Location:  [facility];  Service: Orthopedics;  Laterality: Right;  90 mins  . HEMATOMA EVACUATION Right 06/29/2017   Procedure: EVACUATION HEMATOMA OF RIGHT KNEE;  Surgeon: Wallace Going, DO;  Location: [facility];  Service: Plastics;  Laterality: Right;  . HEMATOMA EVACUATION Right 06/29/2017   Procedure: EVACUATION RIGHT TOTAL KNEE HEMATOMA;  Surgeon: Paralee Cancel, MD;  [facility];  Service: Orthopedics;  Laterality: Right;  . HEMATOMA EVACUATION Right 07/14/2017   Procedure: EVACUATION RIGHT LEG HEMATOMA WITH APPLICATION OF WOUND VAC;  Surgeon: Paralee Cancel, MD;  [facility];  Service: Orthopedics;  Laterality: Right;  .  hemotoma     evacuation left chest wall  . I&D EXTREMITY Right 07/17/2017   Procedure: EVACUATION RIGHT LEG HEMATOMA WITH WOUND VAC DRESSING CHANGE;  Surgeon: Paralee Cancel, MD;  [facility];  Service: Orthopedics;  Laterality: Right;  . I&D KNEE WITH POLY EXCHANGE Right 05/28/2016   Procedure: IRRIGATION AND DEBRIDEMENT KNEE WOUND VAC PLACMENT;  Surgeon: Susa Day, MD;  [facility];  Service: Orthopedics;  Laterality: Right;  . I&D KNEE WITH POLY EXCHANGE Right 05/30/2016   Procedure: RADICAL SYNOVECTOMY,IRRIGATION AND DEBRIDEMENT KNEE WITH POLY EXCHANGE WITH ANTIBIOTIC BEADS, APPLICATION OF WOUND VAC;  Surgeon: Susa Day, MD;  [facility];  Service: Orthopedics;  Laterality: Right;  . INCISION AND DRAINAGE OF WOUND Right 07/31/2016   Procedure: IRRIGATION AND DEBRIDEMENT RIGHT KNEE  WOUND;  Surgeon: Loel Lofty Dillingham, DO;  Location: [place];  Service: Plastics;  Laterality: Right;  . IR FLUORO GUIDE CV LINE LEFT  04/30/2017  . IR US GUIDE VASC ACCESS LEFT  04/30/2017  . IRRIGATION AND DEBRIDEMENT KNEE Right 04/12/2016   Procedure: IRRIGATION AND DEBRIDEMENT KNEE;  Surgeon: Nicholes Stairs, MD;  [facility];  Service: Orthopedics;  Laterality: Right;  . IRRIGATION AND DEBRIDEMENT KNEE Right 12/16/2016   Procedure: Repeat irrigation and debridement right knee, wound closure  wound vac REPLACEMENT ANTIBIOTIC SPACERS;  Surgeon: Paralee Cancel, MD;  [facility];  Service: Orthopedics;  Laterality: Right;  . KNEE ARTHROSCOPY  02/13/2012   Procedure: ARTHROSCOPY KNEE;  Surgeon: Johnn Hai, MD;  Location[facility];  Service: Orthopedics;  Laterality: Left;  WITH DEBRIDEMENt   . KNEE ARTHROSCOPY WITH LATERAL MENISECTOMY  02/13/2012   Procedure: KNEE ARTHROSCOPY WITH LATERAL MENISECTOMY;  Surgeon: Johnn Hai, MD;  Location: [facility];  Service: Orthopedics;;  partial  . LATISSIMUS FLAP RIGHT CHEST  02-23-2015   [facility]  . LEFT MODIFIED  RADICAL MASTECTOMY/ RIGHT TOTAL MASTECTOMY  11-13-2007   LEFT BREAST CANCER W/ AXILLARY LYMPH NODE METASTASIS AND POST NEOADJUVANT CHEMO  . MASS EXCISION Right 06/29/2017   Procedure: EXCISION OF METASTATIC BREAST CANCER TO SKIN RIGHT SHOULDER, PLACEMENT OF CELLERATE;  Surgeon: Wallace Going, DO;  [facility];  Service: Plastics;  Laterality: Right;  . PLACEMENT PORT-A-CATH  06/22/2007    new port placed 2015, right chest  . REIMPLANTATION OF TOTAL KNEE Right 04/27/2017   Procedure: Reimplantation of right total knee arthroplasty;  Surgeon: Paralee Cancel, MD;  [facility];  Service: Orthopedics;  Laterality: Right;  Adductor Block  . RESECTION OF ISOLATED CHEST WALL DISEASE/ ABDOMINAL SKIN FLAP  02/ 2015     [facility]   RIGHT CHEST WALL METASTATIC SKIN CARCINOMA  . SKIN SPLIT GRAFT Right 01/29/2017   Procedure: SKIN GRAFT SPLIT THICKNESS TO RIGHT KNEE WOUND;  Surgeon: Wallace Going, DO;  [facility];  Service: Plastics;  Laterality: Right;  . SKIN SPLIT GRAFT Right 09/07/2017   Procedure: SKIN GRAFT SPLIT THICKNESS TO RIGHT KNEE WOUND WITH PLACEMENT OF VAC DRESSING TO GRAFT SITE;  Surgeon: Wallace Going, DO;  Location: [place];  Service: Plastics;  Laterality: Right;  . TOTAL KNEE ARTHROPLASTY Left 09/23/2012   Procedure: LEFT TOTAL KNEE ARTHROPLASTY;  Surgeon: Johnn Hai, MD;  [facility];  Service: Orthopedics;  Laterality: Left;  . TOTAL KNEE ARTHROPLASTY Right 03/20/2016   Procedure: RIGHT TOTAL KNEE ARTHROPLASTY;  Surgeon: Susa Day, MD;  [facility];  Service: Orthopedics;  Laterality: Right;  . TRANSTHORACIC ECHOCARDIOGRAM  04/24/2017   ef 30-16%, grade 1 diastolic dysfuction/  mild MR with mild prolapse anterior leaflet/ mild TR/ PASP 31mmHg    There were no vitals filed for this visit.  Subjective Assessment - 12/17/17 0934    Subjective  I saw Dr Paulla Fore for my shoulder- got an injection and he gave me some exercies.  My  endurance is improving    Currently in Pain?  No/denies                       [facility] Adult PT Treatment/Exercise - 12/17/17 0001      High Level Balance   High Level Balance Activities  Tandem walking;Side stepping    High Level Balance Comments  at counter top       Knee/Hip Exercises: Aerobic   Nustep  L3 x 10 min staying above 100 spm      Knee/Hip Exercises: Standing   Knee Flexion  Both;20 reps;Limitations    Knee Flexion Limitations  3#    Hip Abduction  Both;20 reps;Knee straight    Abduction Limitations  4#    Hip Extension  Both;2 sets;10 reps;Knee straight    Extension Limitations  4#      Knee/Hip Exercises: Seated   Long Arc Quad  Strengthening;Both;3 sets;Weights    Long Arc Quad Weight  4 lbs.    Hamstring Curl  Strengthening;Both;2 sets;10 reps;Weights standing on black pad     Hamstring Weights  4 lbs.               PT Short Term Goals - 11/04/17 0909      PT SHORT TERM GOAL #2   Title  Pt will be able to complete atleast 20 repetitions of SLR without extension lag, which will improve her safety with ambulation and other activity.     Time  3    Period  Weeks    Status  Achieved Can do 2x10      PT SHORT TERM GOAL #3   Title  Pt will demo Lt knee AROM flexion to atlast 100 deg which will help with technique during sit to stand.     Time  3    Period  Weeks    Status  Achieved 113 degrees      PT SHORT TERM GOAL #5   Title  Pt will demo proper mechanics with sit to stand, without significant weight shift Lt and no more than 1 UE support for safety.     Time  3    Period  Weeks    Status  Achieved        PT Long Term Goals - 12/10/17 0936      PT LONG TERM GOAL #2   Title  demonstrate improved functional strength by performing 5x STS without UE support in < 15  sec    Baseline  16.24 seconds with moderate control    Time  6    Period  Weeks    Status  On-going      PT LONG TERM GOAL #4   Title  FOTO score improved to  </= 40% limitation for improved function    Baseline  40% limitation    Status  Achieved      PT LONG TERM GOAL #5   Title  report pain < 4/10 with activity for improved mobility and function    Status  Achieved      PT LONG TERM GOAL #6   Title  The patient will have the stamina  to walk in the grocery store 10-15 minutes.    Baseline  use of cart- able to walk 15 minutes    Status  Achieved            Plan - 12/17/17 0940    Clinical Impression Statement  Pt is building endurance for functional tasks and community mobility.  Pt demonstrates improved stability with balance exercises and demonstrates improved gait pattern. Pt required stand by assistance with balance exercises for safety today.    Pt with slow progress due to chronic nature of condition and comorbidities.      PT Frequency  2x / week    PT Duration  6 weeks    PT Treatment/Interventions  ADLs/Self Care Home Management;Cryotherapy;Moist Heat;Therapeutic exercise;Therapeutic activities;Functional mobility training;Stair training;Gait training;Balance training;Neuromuscular re-education;Patient/family education;Manual techniques;Vasopneumatic Device;Taping;Passive range of motion    PT Next Visit Plan  LE strength, gait, balance and endurance    PT Home Exercise Plan  Access Code: [email or web address]     Consulted and Agree with Plan of Care  Patient       Patient will benefit from skilled therapeutic intervention in order to improve the following deficits and impairments:  Abnormal gait, Pain, Decreased strength, Decreased activity tolerance, Decreased balance, Decreased mobility, Decreased range of motion  Visit Diagnosis: Acute pain of right knee  Stiffness of right knee, not elsewhere classified  Muscle weakness (generalized)  Other abnormalities of gait and mobility     Problem List Patient Active Problem List   Diagnosis Date Noted  . Wound, open with complication 94/17/4081  . Fever   . Anemia due to  chemotherapy 05/07/2017  . Abnormal LFTs 05/07/2017  . History of breast cancer   . Bilateral leg edema   . Enterococcal infection   . S/P revision of total knee, right 04/27/2017  . Pyogenic arthritis of right knee joint ([place])   . Acute blood loss anemia 12/12/2016  . Right knee abx spacer 12/02/2016  . Physical exam 07/31/2016  . Allergic reaction 07/24/2016  . Drug rash 07/24/2016  . S/P total knee arthroplasty, right 05/31/2016  . 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 Nonhealing surgical wound 05/28/2016  . Sepsis ([place]) 05/10/2016  . UTI (urinary tract infection) 05/10/2016  . Cellulitis of right knee 05/10/2016  . Blood clot in vein   . Wound dehiscence, surgical 04/12/2016  . 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 Vitamin D deficiency 01/25/2016  . 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 Primary osteoarthritis of right knee 02/01/2015  . Symptomatic anemia 02/01/2015  . Frequent UTI 01/12/2014  . Sinus tachycardia 11/03/2013  . SOB (shortness of breath) 11/03/2013  . Postoperative anemia due to acute blood loss 12/24/2012  .    09/23/2012  . Sinusitis 07/28/2012  . Neuropathy due to chemotherapeutic  drug ([place]) 07/25/2011  . 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 Metastatic breast cancer ([place]) 11/12/2010  . Chronic anticoagulation 11/12/2010  . Anxiety, generalized 03/26/2008    Sigurd Sos, PT 12/17/17 10:16 AM  [facility] [address] [place], [place], [address] Phone: [phone]   Fax:  [phone]  Name: MARIAELENA CADE MRN: [id number] Date of Birth: 05/01/1956

## 2017-12-18 ENCOUNTER — Telehealth: Payer: Self-pay | Admitting: Family Medicine

## 2017-12-18 NOTE — Telephone Encounter (Signed)
See note

## 2017-12-18 NOTE — Telephone Encounter (Signed)
Copied from [place] [phone]. Topic: Quick Communication - See Telephone Encounter >> Dec 18, 2017 11:00 AM Mylinda Latina, NT wrote: CRM for notification. See Telephone encounter for: 12/18/17. Patient called and is inquiring about her X ray results. Patient states you can send results via Mychart  Please call CB# [phone]

## 2017-12-18 NOTE — Telephone Encounter (Signed)
Returned pt's call and she has no further questions regarding her L shoulder XR results.

## 2017-12-20 NOTE — Progress Notes (Signed)
Patient ID: [name], female   DOB: 1955/07/02, 62 y.o.   MRN: [id number]     Cardio-Oncology Clinic Note  PCP: Dr. 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   She is s/p bilateral mastectomies, XRT, chemo. She is currently on Kadcyla (trastuzamab-emantsine) every 3 weeks. Underwent skin flap for skim metastasis on her abdomen in 2/15  Had skin resection with chest skin flap on 02/21/15 [facility] Requiring wound vac. Path ok fortunately with no recurrent malignancy.  Had clots around port-a-cath so on Elqiuis.   I saw her last in May. Has had a very difficult year. Had R TKA which got infected and then had to have hardware removed and got a spacer. Hardware eventually replaced. Then developed a hematoma and wound. Chemo had to be stopped for 2 months and required a skin graft. Unfortunately when chemo stopped developed recurrent skin mets which she is now struggling with. Remains on lapatanib (taking every other day due to diarrhea) and herceptin. Had CT chest in 12/18 due to SOB. No PE but showed evidence of possible PAH. VQ ordered 5/19. No PE.  Was having left shoulder pain but bone scan negative. Was seen in Sports Medicine and got injected  Feels pretty good. Feels like she is getting stronger. Mild ankle edema. Back on lapatanib. Breathing ok. Can go to the store without problem. Gets dizzy if not drinking fluid. No CP.   Echo 10/12/17 EF 60-65% GLS -17.5% RV mildly dilated with normal function RVSP 65 IVC ok Personally reviewed   ECHO 09/27/2013 EF 60-65% Lateral S'10.7 Grade 2 DD ECHO 02/27/2014 EF 60-65% Lateral S' 10.4  ECHO 07/14/2014  EF 60-65% Lateral S' 10.4 GLS -23.1% mild MR ECHO 04/18/2015  EF 60-65% Lateral S' 13.6 GLS -19.2% mild TR Trivial MR. RV ok ECHO 03/03/2016 EF 60-65% lateral S 11.8 GLS -19.2 poor window   Past Medical History:  Diagnosis Date  . Arthritis   . Breast cancer  metastasized to skin, right [facility]) followed by dr force -- oncolgoist w/ [place]   primary left breast cancer dx 01/ 2009 ; 11/ 2009 recurrence left chest wall and neck, tx clinical trial drug and chemotherapy;  2015 recurrence cutaneous metastatized to right skin/ chest wall, 02/ 2015 resection chest wall disease and 02-23-2015 s/p skin flap surgery,  chemo every 3 weeks  . 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 Heart murmur   . 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 MVP (mitral valve prolapse)    mild mvp w/ mild regurg. per last echo 04-24-2017  . Neuropathy due to chemotherapeutic drug ([place])   . Non-healing surgical wound    right knee post re-implantation total knee arthroplasty 04-2017 unable to completely close surgical incision  . PONV (postoperative nausea and vomiting)    ponv likes scopolamine patch  . Port-A-Cath in place    RIGHT CHEST   . Red blood cell antibody positive   . Skin changes related to chemotherapy    per patient she has scabbed over skin circumventing  port to right upper chest ;  state it is due to chemptherapy   . 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 acetaminophen (TYLENOL) 500 MG tablet Take 500 mg by mouth every 8 (eight) hours as needed for mild pain or headache.    [name] apixaban (ELIQUIS) 5 MG TABS tablet Take 5 mg by mouth 2 (two) times daily.    . celecoxib (CELEBREX) 200 MG capsule Take 200 mg by mouth 2 (two) times  daily as needed.    . cetirizine (ZYRTEC) 10 MG tablet Take 1 tablet (10 mg total) daily by mouth. (Patient taking differently: Take 10 mg by mouth as needed. ) 30 tablet 11  . Cholecalciferol (VITAMIN D3) 2000 UNITS TABS Take 2,000 Units by mouth 4 (four) times a week.     . darifenacin (ENABLEX) 15 MG 24 hr tablet Take 1 tablet (15 mg total) by mouth every morning. 90 tablet 1  . diphenoxylate-atropine (LOMOTIL) 2.5-0.025 MG tablet diphenoxylate-atropine 2.5 mg-0.025 mg tablet  TAKE 1 TABLET BY MOUTH 4 (FOUR) TIMES DAILY AS NEEDED FOR DIARRHEA    . docusate sodium (COLACE) 100 MG capsule Take 1 capsule (100 mg total) by mouth 2 (two) times daily. (Patient taking differently: Take 100 mg by mouth as needed. ) 10 capsule 0  . doxycycline (VIBRA-TABS) 100 MG tablet Take 100 mg by mouth 2 (two) times daily. 6 month course started 05-2017    . gabapentin (NEURONTIN) 300 MG capsule TAKE 1 CAPSULE BY MOUTH 3 TIMES DAILY 90 capsule 6  . lapatinib (TYKERB) 250 MG tablet Take 750 mg by mouth every other day. Take on an empty stomach, at least 1 hour before or 1 hour after meals.    . Multiple Vitamin (MULTIVITAMIN WITH MINERALS) TABS tablet Take 1 tablet by mouth daily.    . ondansetron (ZOFRAN) 8 MG tablet TAKE 1 TABLET BY MOUTH EVERY 8 HOURS AS NEEDED FOR NAUSEA. 20 tablet 6  . polyethylene glycol (MIRALAX / GLYCOLAX) packet Take 17 g by mouth 2 (two) times daily. (Patient taking differently: Take 17 g by mouth as needed. ) 14 each 0  . prochlorperazine (COMPAZINE) 10 MG tablet TAKE 1 TABLET (10 MG TOTAL) BY MOUTH EVERY 6 (SIX) HOURS AS NEEDED FOR NAUSEA  3  . Trastuzumab (HERCEPTIN IV) Inject into the vein.    [name] zolpidem (AMBIEN) 5 MG tablet TAKE 1 TABLET BY MOUTH ONCE DAILY AT BEDTIME AS NEEDED FOR SLEEP 10 tablet 0   No current facility-administered medications for this encounter.     Allergies  Allergen Reactions  . Penicillins Hives, Itching and Rash    Has patient had a PCN reaction causing  immediate rash, facial/tongue/throat swelling, SOB or lightheadedness with hypotension: yes Has patient had a PCN reaction causing severe rash involving mucus membranes or skin necrosis: No Has patient had a PCN reaction that required hospitalization No Has patient had a PCN reaction occurring within the last 10 years: yes If all of the above answers are "NO", then may proceed with Cephalosporin use.   Denies airway involvement   . Other Rash    STERI STRIPS - Blisters  . Tape Hives    Can tolerate paper tape    Social History   Socioeconomic History  . Marital status: Married  Spouse name: Not on file  . Number of children: 3  . Years of education: Not on file  . Highest education level: Not on file  Occupational History  . 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 Financial resource strain: Not on file  . Food insecurity:    Worry: Not on file    Inability: Not on file  . Transportation needs:    Medical: Not on file    Non-medical: Not on file  Tobacco Use  . Smoking status: Never Smoker  . Smokeless tobacco: Never Used  Substance and Sexual Activity  . Alcohol use: No  . Drug use: No  . Sexual activity: Yes  Lifestyle  . Physical activity:    Days per week: Not on file    Minutes per session: Not on file  . Stress: Not on file  Relationships  . Social connections:    Talks on phone: Not on file    Gets together: Not on file    Attends religious service: Not on file    Active member of club or organization: Not on file    Attends meetings of clubs or organizations: Not on file    Relationship status: Not on file  . Intimate partner violence:    Fear of current or ex partner: Not on file    Emotionally abused: Not on file    Physically abused: Not on file    Forced sexual activity: Not on file  Other Topics Concern  . Not on file  Social History Narrative   Caffeine use: none   Regular exercise:  No   Married- lives with 86 year old daughter  and her husband   Works as an Therapist, sports at the [employer] at [employer].          Family History  Problem Relation Age of Onset  . Heart disease Mother        due to mitral valve regurgiation,   . Cancer Mother        breast  . Allergic rhinitis Mother   . Parkinsonism Father   . Allergic rhinitis Father   . Migraines Father   . Alcohol abuse Paternal Grandfather   . Angioedema Neg Hx   . Asthma Neg Hx   . Eczema Neg Hx     PHYSICAL EXAM: Vitals:   12/21/17 1026  BP: (!) 103/56  Pulse: 99  SpO2: 99%  Weight: 169 lb (76.7 kg)  Height: 5' 7"  (1.702 m)   General:  Well appearing. No resp difficulty HEENT: normal Neck: supple. no JVD. Carotids 2+ bilat; no bruits. No lymphadenopathy or thryomegaly appreciated. Cor: PMI nondisplaced. Regular rate & rhythm. 2/6 TR. No RV lift Lungs: clear Abdomen: soft, nontender, nondistended. No hepatosplenomegaly. No bruits or masses. Good bowel sounds. Extremities: no cyanosis, clubbing, rash, edema Neuro: alert & orientedx3, cranial nerves grossly intact. moves all 4 extremities w/o difficulty. Affect pleasant   ASSESSMENT & PLAN: 1. Breast CA, stage IV with skin mets - HER 2-neu+ , ER/PR -, BRCA - 2. S/p RTKA with prosthetic infection - resolved 3. PAH - CT chest No PE - VQ 5/19 No PE  Echo and VQ reviewed personally. She has PAH of unclear etiology. No evidence of CTEPH. 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 Will repeat echo in 1 month. If RVSP the same or increasing wil consider RHC.  Henson Fraticelli,MD 10:52 AM

## 2017-12-21 ENCOUNTER — Ambulatory Visit (HOSPITAL_COMMUNITY)
Admission: RE | Admit: 2017-12-21 | Discharge: 2017-12-21 | Disposition: A | Discharge status: Home / Self Care | Payer: 59 | Source: Ambulatory Visit | Attending: Internal Medicine | Admitting: Internal Medicine

## 2017-12-21 ENCOUNTER — Ambulatory Visit: Payer: 59 | Admitting: Physical Therapy

## 2017-12-21 ENCOUNTER — Other Ambulatory Visit: Payer: Self-pay

## 2017-12-21 ENCOUNTER — Encounter: Payer: Self-pay | Admitting: Physical Therapy

## 2017-12-21 VITALS — BP 103/56 | HR 99 | Ht 67.0 in | Wt 169.0 lb

## 2017-12-21 DIAGNOSIS — Z96651 Presence of right artificial knee joint: Secondary | ICD-10-CM | POA: Diagnosis not present

## 2017-12-21 DIAGNOSIS — M25612 Stiffness of left shoulder, not elsewhere classified: Secondary | ICD-10-CM | POA: Diagnosis not present

## 2017-12-21 DIAGNOSIS — C50919 Malignant neoplasm of unspecified site of unspecified female breast: Secondary | ICD-10-CM | POA: Diagnosis not present

## 2017-12-21 DIAGNOSIS — M25561 Pain in right knee: Secondary | ICD-10-CM | POA: Diagnosis not present

## 2017-12-21 DIAGNOSIS — Z79899 Other long term (current) drug therapy: Secondary | ICD-10-CM | POA: Insufficient documentation

## 2017-12-21 DIAGNOSIS — Z888 Allergy status to other drugs, medicaments and biological substances status: Secondary | ICD-10-CM | POA: Diagnosis not present

## 2017-12-21 DIAGNOSIS — R2689 Other abnormalities of gait and mobility: Secondary | ICD-10-CM

## 2017-12-21 DIAGNOSIS — R600 Localized edema: Secondary | ICD-10-CM | POA: Diagnosis not present

## 2017-12-21 DIAGNOSIS — M25661 Stiffness of right knee, not elsewhere classified: Secondary | ICD-10-CM

## 2017-12-21 DIAGNOSIS — Z7901 Long term (current) use of anticoagulants: Secondary | ICD-10-CM | POA: Diagnosis not present

## 2017-12-21 DIAGNOSIS — Z9013 Acquired absence of bilateral breasts and nipples: Secondary | ICD-10-CM | POA: Diagnosis not present

## 2017-12-21 DIAGNOSIS — I2721 Secondary pulmonary arterial hypertension: Secondary | ICD-10-CM | POA: Diagnosis not present

## 2017-12-21 DIAGNOSIS — C792 Secondary malignant neoplasm of skin: Secondary | ICD-10-CM | POA: Insufficient documentation

## 2017-12-21 DIAGNOSIS — K766 Portal hypertension: Secondary | ICD-10-CM | POA: Diagnosis not present

## 2017-12-21 DIAGNOSIS — Z880 Allergy status to penicillin: Secondary | ICD-10-CM | POA: Insufficient documentation

## 2017-12-21 DIAGNOSIS — M6281 Muscle weakness (generalized): Secondary | ICD-10-CM

## 2017-12-21 DIAGNOSIS — Z9221 Personal history of antineoplastic chemotherapy: Secondary | ICD-10-CM | POA: Insufficient documentation

## 2017-12-21 DIAGNOSIS — M25512 Pain in left shoulder: Secondary | ICD-10-CM | POA: Diagnosis not present

## 2017-12-21 NOTE — Therapy (Signed)
[facility] [address], [place], [place], [address] Phone: [phone]   Fax:  [phone]  Physical Therapy Treatment  Patient Details  Name: [name] MRN: [id number] Date of Birth: 01-17-1956 Referring Provider: Dr. Paralee Cancel   Encounter Date: 12/21/2017  PT End of Session - 12/21/17 0759    Visit Number  23    Number of Visits  36    Date for PT Re-Evaluation  12/28/17    Authorization Type  [facility] [employer] (prior auth required for secondary insurance); 24 visits total approved (9 used prior to 09/02/17).  12 additional approved    Authorization - Number of Visits  36    PT Start Time  0757    PT Stop Time  0836    PT Time Calculation (min)  39 min    Activity Tolerance  Patient tolerated treatment well    Behavior During Therapy  WFL for tasks assessed/performed       Past Medical History:  Diagnosis Date  . Arthritis   . 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 Heart murmur   . 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 MVP (mitral valve prolapse)    mild mvp w/ mild regurg. per last echo 04-24-2017  . Neuropathy due to chemotherapeutic drug ([place])   . Non-healing surgical  wound    right knee post re-implantation total knee arthroplasty 04-2017 unable to completely close surgical incision  . PONV (postoperative nausea and vomiting)    ponv likes scopolamine patch  . Port-A-Cath in place    RIGHT CHEST   . Red blood cell antibody positive   . Skin changes related to chemotherapy    per patient she has scabbed over skin circumventing port to right upper chest ;  state it is due to chemptherapy   . 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 APPLICATION OF A-CELL OF BACK Right 07/31/2016   Procedure: CELLERATE COLLAGEN PLACEMENT;  Surgeon: Loel Lofty Dillingham, DO;  Location: [place];  Service: Plastics;  Laterality: Right;  . APPLICATION OF WOUND VAC Right 06/29/2017   Procedure: APPLICATION OF WOUND VAC;  Surgeon: Wallace Going, DO;  Location: [facility];  Service: Plastics;  Laterality: Right;  . DEBRIDEMENT CHEST WALL RIGHT/ VAC PLACEMENT  02-09-2015    [facility]  . EXCISIONAL TOTAL KNEE ARTHROPLASTY WITH ANTIBIOTIC SPACERS Right 12/02/2016   Procedure: EXCISIONAL TOTAL KNEE ARTHROPLASTY WITH ANTIBIOTIC SPACERS;  Surgeon: Paralee Cancel, MD;  Location: [facility];  Service: Orthopedics;  Laterality: Right;  90 mins  . HEMATOMA EVACUATION Right 06/29/2017   Procedure: EVACUATION HEMATOMA OF RIGHT KNEE;  Surgeon: Wallace Going, DO;  Location: [facility];  Service: Plastics;  Laterality: Right;  . HEMATOMA EVACUATION Right 06/29/2017   Procedure: EVACUATION RIGHT TOTAL KNEE HEMATOMA;  Surgeon: Paralee Cancel, MD;  [facility];  Service: Orthopedics;  Laterality: Right;  . HEMATOMA EVACUATION Right 07/14/2017   Procedure: EVACUATION RIGHT LEG HEMATOMA WITH APPLICATION OF WOUND VAC;  Surgeon: Paralee Cancel, MD;  [facility];  Service: Orthopedics;  Laterality: Right;  . hemotoma     evacuation left chest  wall  . I&D EXTREMITY Right 07/17/2017   Procedure: EVACUATION RIGHT LEG HEMATOMA WITH WOUND VAC DRESSING CHANGE;  Surgeon: Paralee Cancel, MD;  [facility];  Service: Orthopedics;  Laterality: Right;  . I&D KNEE WITH POLY EXCHANGE Right 05/28/2016   Procedure: IRRIGATION AND DEBRIDEMENT KNEE WOUND VAC PLACMENT;  Surgeon: Susa Day, MD;  [facility];  Service: Orthopedics;  Laterality: Right;  . I&D KNEE WITH POLY EXCHANGE Right 05/30/2016   Procedure: RADICAL SYNOVECTOMY,IRRIGATION AND DEBRIDEMENT KNEE WITH POLY EXCHANGE WITH ANTIBIOTIC BEADS, APPLICATION OF WOUND VAC;  Surgeon: Susa Day, MD;  Location[facility];  Service: Orthopedics;  Laterality: Right;  . INCISION AND DRAINAGE OF WOUND Right 07/31/2016   Procedure: IRRIGATION AND DEBRIDEMENT RIGHT KNEE  WOUND;  Surgeon: Loel Lofty Dillingham, DO;  Location: [place];  Service: Plastics;  Laterality: Right;  . IR FLUORO GUIDE CV LINE LEFT  04/30/2017  . IR US GUIDE VASC ACCESS LEFT  04/30/2017  . IRRIGATION AND DEBRIDEMENT KNEE Right 04/12/2016   Procedure: IRRIGATION AND DEBRIDEMENT KNEE;  Surgeon: Nicholes Stairs, MD;  Location[facility];  Service: Orthopedics;  Laterality: Right;  . IRRIGATION AND DEBRIDEMENT KNEE Right 12/16/2016   Procedure: Repeat irrigation and debridement right knee, wound closure  wound vac REPLACEMENT ANTIBIOTIC SPACERS;  Surgeon: Paralee Cancel, MD;  Location[facility];  Service: Orthopedics;  Laterality: Right;  . KNEE ARTHROSCOPY  02/13/2012   Procedure: ARTHROSCOPY KNEE;  Surgeon: Johnn Hai, MD;  Location: [facility];  Service: Orthopedics;  Laterality: Left;  WITH DEBRIDEMENt   . KNEE ARTHROSCOPY WITH LATERAL MENISECTOMY  02/13/2012   Procedure: KNEE ARTHROSCOPY WITH LATERAL MENISECTOMY;  Surgeon: Johnn Hai, MD;  Location: [facility];  Service: Orthopedics;;  partial  . LATISSIMUS FLAP RIGHT CHEST  02-23-2015   [facility]  . LEFT MODIFIED RADICAL MASTECTOMY/ RIGHT TOTAL  MASTECTOMY  11-13-2007   LEFT BREAST CANCER W/ AXILLARY LYMPH NODE METASTASIS AND POST NEOADJUVANT CHEMO  . MASS EXCISION Right 06/29/2017   Procedure: EXCISION OF METASTATIC BREAST CANCER TO SKIN RIGHT SHOULDER, PLACEMENT OF CELLERATE;  Surgeon: Wallace Going, DO;  Location[facility];  Service: Plastics;  Laterality: Right;  . PLACEMENT PORT-A-CATH  06/22/2007    new port placed 2015, right chest  . REIMPLANTATION OF TOTAL KNEE Right 04/27/2017   Procedure: Reimplantation of right total knee arthroplasty;  Surgeon: Paralee Cancel, MD;  Location[facility];  Service: Orthopedics;  Laterality: Right;  Adductor Block  . RESECTION OF ISOLATED CHEST WALL DISEASE/ ABDOMINAL SKIN FLAP  02/ 2015     [facility]   RIGHT CHEST WALL METASTATIC SKIN CARCINOMA  . SKIN SPLIT GRAFT Right 01/29/2017   Procedure: SKIN GRAFT SPLIT THICKNESS TO RIGHT KNEE WOUND;  Surgeon: Wallace Going, DO;  Location[facility];  Service: Plastics;  Laterality: Right;  . SKIN SPLIT GRAFT Right 09/07/2017  Procedure: SKIN GRAFT SPLIT THICKNESS TO RIGHT KNEE WOUND WITH PLACEMENT OF VAC DRESSING TO GRAFT SITE;  Surgeon: Wallace Going, DO;  Location: [place];  Service: Plastics;  Laterality: Right;  . TOTAL KNEE ARTHROPLASTY Left 09/23/2012   Procedure: LEFT TOTAL KNEE ARTHROPLASTY;  Surgeon: Johnn Hai, MD;  Location[facility];  Service: Orthopedics;  Laterality: Left;  . TOTAL KNEE ARTHROPLASTY Right 03/20/2016   Procedure: RIGHT TOTAL KNEE ARTHROPLASTY;  Surgeon: Susa Day, MD;  [facility];  Service: Orthopedics;  Laterality: Right;  . TRANSTHORACIC ECHOCARDIOGRAM  04/24/2017   ef 87-86%, grade 1 diastolic dysfuction/  mild MR with mild prolapse anterior leaflet/ mild TR/ PASP 46mmHg    There were no vitals filed for this visit.  Subjective Assessment - 12/21/17 0801    Subjective  I am feeling pretty good this AM, it's just early.    Currently in Pain?  No/denies    Multiple Pain Sites  No                        [facility] Adult PT Treatment/Exercise - 12/21/17 0001      Knee/Hip Exercises: Aerobic   Nustep  L3 x 10 min staying above 100 spm      Knee/Hip Exercises: Standing   Heel Raises  Both;20 reps On black pad for neuro-reed    Knee Flexion  Both;20 reps;Limitations VC/TC to not hike hip LT    Knee Flexion Limitations  4#    Hip Abduction  Both;2 sets;15 reps;Knee straight    Abduction Limitations  4#    Hip Extension  Both;2 sets;15 reps;Knee straight    Extension Limitations  4#    Other Standing Knee Exercises  standing on black pad, 20x marching, 2x 15 squats VC to sit hips abck towards chair more      Knee/Hip Exercises: Seated   Long Arc Quad  Strengthening;Both;3 sets;Weights    Long Arc Quad Weight  4 lbs.    Ball Squeeze  15 x fast, 15 slow    Clamshell with TheraBand  Green 25x seated, standing 10 feet 3x    Hamstring Curl  Strengthening;Both;2 sets;10 reps;Weights standing on black pad     Hamstring Weights  4 lbs.    Sit to Sand  2 sets;10 reps;without UE support Ascent fast, descend slow VC               PT Short Term Goals - 11/04/17 0909      PT SHORT TERM GOAL #2   Title  Pt will be able to complete atleast 20 repetitions of SLR without extension lag, which will improve her safety with ambulation and other activity.     Time  3    Period  Weeks    Status  Achieved Can do 2x10      PT SHORT TERM GOAL #3   Title  Pt will demo Lt knee AROM flexion to atlast 100 deg which will help with technique during sit to stand.     Time  3    Period  Weeks    Status  Achieved 113 degrees      PT SHORT TERM GOAL #5   Title  Pt will demo proper mechanics with sit to stand, without significant weight shift Lt and no more than 1 UE support for safety.     Time  3    Period  Weeks    Status  Achieved        PT Long Term Goals - 12/10/17 0936      PT LONG TERM GOAL #2   Title  demonstrate improved functional strength by performing 5x  STS without UE support in < 15 sec    Baseline  16.24 seconds with moderate control    Time  6    Period  Weeks    Status  On-going      PT LONG TERM GOAL #4   Title  FOTO score improved to </= 40% limitation for improved function    Baseline  40% limitation    Status  Achieved      PT LONG TERM GOAL #5   Title  report pain < 4/10 with activity for improved mobility and function    Status  Achieved      PT LONG TERM GOAL #6   Title  The patient will have the stamina  to walk in the grocery store 10-15 minutes.    Baseline  use of cart- able to walk 15 minutes    Status  Achieved            Plan - 12/21/17 0800    Clinical Impression Statement  Pt tolerated all strength, endurance, and balance exercises well. Some fatigue noted but nothing that pt felt was inappropriately too much. Good shaking of her hips and quads during strength demonstrated..    Rehab Potential  Good    PT Frequency  2x / week    PT Duration  6 weeks    PT Treatment/Interventions  ADLs/Self Care Home Management;Cryotherapy;Moist Heat;Therapeutic exercise;Therapeutic activities;Functional mobility training;Stair training;Gait training;Balance training;Neuromuscular re-education;Patient/family education;Manual techniques;Vasopneumatic Device;Taping;Passive range of motion    PT Next Visit Plan  LE strength, gait, balance and endurance    PT Home Exercise Plan  Access Code: [email or web address]     Consulted and Agree with Plan of Care  Patient       Patient will benefit from skilled therapeutic intervention in order to improve the following deficits and impairments:  Abnormal gait, Pain, Decreased strength, Decreased activity tolerance, Decreased balance, Decreased mobility, Decreased range of motion  Visit Diagnosis: Acute pain of right knee  Stiffness of right knee, not elsewhere classified  Muscle weakness (generalized)  Other abnormalities of gait and mobility  Localized edema     Problem List Patient  Active Problem List   Diagnosis Date Noted  . Wound, open with complication 84/13/2440  . Fever   . Anemia due to chemotherapy 05/07/2017  . Abnormal LFTs 05/07/2017  . History of breast cancer   . Bilateral leg edema   . Enterococcal infection   . S/P revision of total knee, right 04/27/2017  . Pyogenic arthritis of right knee joint ([place])   . Acute blood loss anemia 12/12/2016  . Right knee abx spacer 12/02/2016  . Physical exam 07/31/2016  . Allergic reaction 07/24/2016  . Drug rash 07/24/2016  . S/P total knee arthroplasty, right 05/31/2016  . 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 Nonhealing surgical wound 05/28/2016  . Sepsis ([place]) 05/10/2016  . UTI (urinary tract infection) 05/10/2016  . Cellulitis of right knee 05/10/2016  . Blood clot in vein   . Wound dehiscence, surgical 04/12/2016  . 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 Vitamin D deficiency 01/25/2016  . 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 Primary osteoarthritis of right knee 02/01/2015  . Symptomatic anemia 02/01/2015  . Frequent UTI 01/12/2014  . Sinus tachycardia 11/03/2013  .  SOB (shortness of breath) 11/03/2013  . Postoperative anemia due to acute blood loss 12/24/2012  .    09/23/2012  . Sinusitis 07/28/2012  . Neuropathy due to chemotherapeutic drug ([place]) 07/25/2011  . 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 Metastatic breast cancer ([place]) 11/12/2010  . Chronic anticoagulation 11/12/2010  . Anxiety, generalized 03/26/2008    Tewana Bohlen, PTA 12/21/2017, 8:35 AM  [facility] [address], [place], [place], [address] Phone: [phone]   Fax:  [phone]  Name: [name] MRN: [id number] Date of Birth: 1956/03/18

## 2017-12-21 NOTE — Patient Instructions (Signed)
Echocardiogram and Follow up in 1 month

## 2017-12-22 DIAGNOSIS — C792 Secondary malignant neoplasm of skin: Secondary | ICD-10-CM | POA: Diagnosis not present

## 2017-12-22 DIAGNOSIS — C50412 Malignant neoplasm of upper-outer quadrant of left female breast: Secondary | ICD-10-CM | POA: Diagnosis not present

## 2017-12-22 DIAGNOSIS — C771 Secondary and unspecified malignant neoplasm of intrathoracic lymph nodes: Secondary | ICD-10-CM | POA: Diagnosis not present

## 2017-12-22 DIAGNOSIS — Z5112 Encounter for antineoplastic immunotherapy: Secondary | ICD-10-CM | POA: Diagnosis not present

## 2017-12-22 DIAGNOSIS — G629 Polyneuropathy, unspecified: Secondary | ICD-10-CM | POA: Diagnosis not present

## 2017-12-22 DIAGNOSIS — K5900 Constipation, unspecified: Secondary | ICD-10-CM | POA: Diagnosis not present

## 2017-12-22 DIAGNOSIS — J040 Acute laryngitis: Secondary | ICD-10-CM | POA: Diagnosis not present

## 2017-12-22 DIAGNOSIS — Z171 Estrogen receptor negative status [ER-]: Secondary | ICD-10-CM | POA: Diagnosis not present

## 2017-12-22 DIAGNOSIS — M7989 Other specified soft tissue disorders: Secondary | ICD-10-CM | POA: Diagnosis not present

## 2017-12-22 DIAGNOSIS — C50912 Malignant neoplasm of unspecified site of left female breast: Secondary | ICD-10-CM | POA: Diagnosis not present

## 2017-12-22 MED FILL — TYKERB 250 MG TABLET: 250 | 28 days supply | Qty: 84 | Fill #0

## 2017-12-24 ENCOUNTER — Ambulatory Visit: Payer: 59 | Attending: Orthopedic Surgery

## 2017-12-24 ENCOUNTER — Ambulatory Visit: Payer: 59 | Admitting: Psychology

## 2017-12-24 DIAGNOSIS — R2689 Other abnormalities of gait and mobility: Secondary | ICD-10-CM | POA: Insufficient documentation

## 2017-12-24 DIAGNOSIS — R600 Localized edema: Secondary | ICD-10-CM | POA: Insufficient documentation

## 2017-12-24 DIAGNOSIS — M6281 Muscle weakness (generalized): Secondary | ICD-10-CM | POA: Diagnosis not present

## 2017-12-24 DIAGNOSIS — M25661 Stiffness of right knee, not elsewhere classified: Secondary | ICD-10-CM | POA: Diagnosis not present

## 2017-12-24 DIAGNOSIS — M25561 Pain in right knee: Secondary | ICD-10-CM | POA: Diagnosis not present

## 2017-12-24 DIAGNOSIS — M1711 Unilateral primary osteoarthritis, right knee: Secondary | ICD-10-CM | POA: Diagnosis not present

## 2017-12-24 NOTE — Therapy (Signed)
[facility] [address], [place], [place], [address] Phone: [phone]   Fax:  [phone]  Physical Therapy Treatment  Patient Details  Name: [name] MRN: [id number] Date of Birth: 1955-07-08 Referring Provider: Dr. Paralee Cancel   Encounter Date: 12/24/2017  PT End of Session - 12/24/17 1006    Visit Number  24    Date for PT Re-Evaluation  01/21/18    Authorization Type  [facility] U[employer] (prior auth required for secondary insurance); 24 visits total approved (9 used prior to 09/02/17).  12 additional approved    Authorization - Visit Number  31    Authorization - Number of Visits  36    PT Start Time  0920    PT Stop Time  1002    PT Time Calculation (min)  42 min    Activity Tolerance  Patient tolerated treatment well    Behavior During Therapy  WFL for tasks assessed/performed       Past Medical History:  Diagnosis Date  . Arthritis   . 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 Heart murmur   . 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 MVP (mitral valve prolapse)    mild mvp w/ mild regurg. per last echo 04-24-2017  . Neuropathy due to chemotherapeutic drug ([place])   .  Non-healing surgical wound    right knee post re-implantation total knee arthroplasty 04-2017 unable to completely close surgical incision  . PONV (postoperative nausea and vomiting)    ponv likes scopolamine patch  . Port-A-Cath in place    RIGHT CHEST   . Red blood cell antibody positive   . Skin changes related to chemotherapy    per patient she has scabbed over skin circumventing port to right upper chest ;  state it is due to chemptherapy   . 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 APPLICATION OF A-CELL OF BACK Right 07/31/2016   Procedure: CELLERATE COLLAGEN PLACEMENT;  Surgeon: Loel Lofty Dillingham, DO;  Location: [place];  Service: Plastics;  Laterality: Right;  . APPLICATION OF WOUND VAC Right 06/29/2017   Procedure: APPLICATION OF WOUND VAC;  Surgeon: Wallace Going, DO;  Location: [facility];  Service: Plastics;  Laterality: Right;  . DEBRIDEMENT CHEST WALL RIGHT/ VAC PLACEMENT  02-09-2015    [facility]  . EXCISIONAL TOTAL KNEE ARTHROPLASTY WITH ANTIBIOTIC SPACERS Right 12/02/2016   Procedure: EXCISIONAL TOTAL KNEE ARTHROPLASTY WITH ANTIBIOTIC SPACERS;  Surgeon: Paralee Cancel, MD;  Location[facility];  Service: Orthopedics;  Laterality: Right;  90 mins  . HEMATOMA EVACUATION Right 06/29/2017   Procedure: EVACUATION HEMATOMA OF RIGHT KNEE;  Surgeon: Dillingham, Claire S, DO;  Location: [facility];  Service: Plastics;  Laterality: Right;  . HEMATOMA EVACUATION Right 06/29/2017   Procedure: EVACUATION RIGHT TOTAL KNEE HEMATOMA;  Surgeon: Olin, Matthew, MD;  [facility];  Service: Orthopedics;  Laterality: Right;  . HEMATOMA EVACUATION Right 07/14/2017   Procedure: EVACUATION RIGHT LEG HEMATOMA WITH APPLICATION OF WOUND VAC;  Surgeon: Olin, Matthew, MD;  [facility];  Service: Orthopedics;  Laterality: Right;  . hemotoma      evacuation left chest wall  . I&D EXTREMITY Right 07/17/2017   Procedure: EVACUATION RIGHT LEG HEMATOMA WITH WOUND VAC DRESSING CHANGE;  Surgeon: Olin, Matthew, MD;  [facility];  Service: Orthopedics;  Laterality: Right;  . I&D KNEE WITH POLY EXCHANGE Right 05/28/2016   Procedure: IRRIGATION AND DEBRIDEMENT KNEE WOUND VAC PLACMENT;  Surgeon: Jeffrey Beane, MD;  [facility];  Service: Orthopedics;  Laterality: Right;  . I&D KNEE WITH POLY EXCHANGE Right 05/30/2016   Procedure: RADICAL SYNOVECTOMY,IRRIGATION AND DEBRIDEMENT KNEE WITH POLY EXCHANGE WITH ANTIBIOTIC BEADS, APPLICATION OF WOUND VAC;  Surgeon: Jeffrey Beane, MD;  [facility];  Service: Orthopedics;  Laterality: Right;  . INCISION AND DRAINAGE OF WOUND Right 07/31/2016   Procedure: IRRIGATION AND DEBRIDEMENT RIGHT KNEE  WOUND;  Surgeon: Claire S Dillingham, DO;  [facility];  Service: Plastics;  Laterality: Right;  . IR FLUORO GUIDE CV LINE LEFT  04/30/2017  . IR US GUIDE VASC ACCESS LEFT  04/30/2017  . IRRIGATION AND DEBRIDEMENT KNEE Right 04/12/2016   Procedure: IRRIGATION AND DEBRIDEMENT KNEE;  Surgeon: Jason Patrick Rogers, MD;  [facility];  Service: Orthopedics;  Laterality: Right;  . IRRIGATION AND DEBRIDEMENT KNEE Right 12/16/2016   Procedure: Repeat irrigation and debridement right knee, wound closure  wound vac REPLACEMENT ANTIBIOTIC SPACERS;  Surgeon: Olin, Matthew, MD;  [facility];  Service: Orthopedics;  Laterality: Right;  . KNEE ARTHROSCOPY  02/13/2012   Procedure: ARTHROSCOPY KNEE;  Surgeon: Jeffrey C Beane, MD;  Location: [facility];  Service: Orthopedics;  Laterality: Left;  WITH DEBRIDEMENt   . KNEE ARTHROSCOPY WITH LATERAL MENISECTOMY  02/13/2012   Procedure: KNEE ARTHROSCOPY WITH LATERAL MENISECTOMY;  Surgeon: Jeffrey C Beane, MD;  Location: [facility];  Service: Orthopedics;;  partial  . LATISSIMUS FLAP RIGHT CHEST  02-23-2015   [facility]  . LEFT MODIFIED RADICAL  MASTECTOMY/ RIGHT TOTAL MASTECTOMY  11-13-2007   LEFT BREAST CANCER W/ AXILLARY LYMPH NODE METASTASIS AND POST NEOADJUVANT CHEMO  . MASS EXCISION Right 06/29/2017   Procedure: EXCISION OF METASTATIC BREAST CANCER TO SKIN RIGHT SHOULDER, PLACEMENT OF CELLERATE;  Surgeon: Dillingham, Claire S, DO;  [facility];  Service: Plastics;  Laterality: Right;  . PLACEMENT PORT-A-CATH  06/22/2007    new port placed 2015, right chest  . REIMPLANTATION OF TOTAL KNEE Right 04/27/2017   Procedure: Reimplantation of right total knee arthroplasty;  Surgeon: Olin, Matthew, MD;  [facility];  Service: Orthopedics;  Laterality: Right;  Adductor Block  . RESECTION OF ISOLATED CHEST WALL DISEASE/ ABDOMINAL SKIN FLAP  02/ 2015     [facility]   RIGHT CHEST WALL METASTATIC SKIN CARCINOMA  . SKIN SPLIT GRAFT Right 01/29/2017   Procedure: SKIN GRAFT SPLIT THICKNESS TO RIGHT KNEE WOUND;  Surgeon: Dillingham, Claire S, DO;  [facility];  Service: Plastics;  Laterality: Right;  . SKIN SPLIT GRAFT Right 09/07/2017     Procedure: SKIN GRAFT SPLIT THICKNESS TO RIGHT KNEE WOUND WITH PLACEMENT OF VAC DRESSING TO GRAFT SITE;  Surgeon: Wallace Going, DO;  Location: [place];  Service: Plastics;  Laterality: Right;  . TOTAL KNEE ARTHROPLASTY Left 09/23/2012   Procedure: LEFT TOTAL KNEE ARTHROPLASTY;  Surgeon: Johnn Hai, MD;  [facility];  Service: Orthopedics;  Laterality: Left;  . TOTAL KNEE ARTHROPLASTY Right 03/20/2016   Procedure: RIGHT TOTAL KNEE ARTHROPLASTY;  Surgeon: Susa Day, MD;  [facility];  Service: Orthopedics;  Laterality: Right;  . TRANSTHORACIC ECHOCARDIOGRAM  04/24/2017   ef 40-98%, grade 1 diastolic dysfuction/  mild MR with mild prolapse anterior leaflet/ mild TR/ PASP 25mmHg    There were no vitals filed for this visit.  Subjective Assessment - 12/24/17 0932    Subjective  I am going to the MD today.      Patient Stated Goals  improve standing and endurance     Currently in Pain?  No/denies         [facility] PT Assessment - 12/24/17 0001      Assessment   Medical Diagnosis  Rt TKA reconstruction s/p I&D with wound vac placement x 2    Onset Date/Surgical Date  04/27/17      Observation/Other Assessments   Focus on Therapeutic Outcomes (FOTO)   40% limitaiton      Strength   Overall Strength Comments  tested in sitting    Right Hip Flexion  4+/5    Right Hip ABduction  4/5    Left Hip Flexion  4+/5    Left Hip ABduction  4/5    Right Knee Flexion  4+/5    Right Knee Extension  4/5    Left Knee Flexion  4+/5    Left Knee Extension  4/5      Palpation   Palpation comment  well healing hematoma/wound area.  Discoloration of the Rt knee       Transfers   Transfers  Stand to Sit;Sit to Stand    Sit to Stand  With upper extremity assist    Stand to Sit  With upper extremity assist      Ambulation/Gait   Ambulation/Gait  Yes    Ambulation/Gait Assistance  6: Modified independent (Device/Increase time)    Gait Pattern  Step-through pattern;Wide base of support    Pre-Gait Activities  3 minute walk test: 640 feet    Gait Comments  improved heel strike with ambulation.  Wide base of support.  This is becoming more normalized.        High Level Balance   High Level Balance Activities  Tandem walking;Side stepping    High Level Balance Comments  at counter top                    [facility] Adult PT Treatment/Exercise - 12/24/17 0001      Knee/Hip Exercises: Aerobic   Nustep  L3 x 10 min staying above 100 spm      Knee/Hip Exercises: Standing   Heel Raises  --    Knee Flexion  Both;20 reps;Limitations VC/TC to not hike hip LT    Knee Flexion Limitations  4#    Hip Abduction  Both;2 sets;15 reps;Knee straight    Abduction Limitations  4#    Hip Extension  Both;2 sets;15 reps;Knee straight    Extension Limitations  4#    Other Standing Knee Exercises  --      Knee/Hip  Exercises: Seated   Long Arc Quad  Strengthening;Both;3  sets;Weights    Long Arc Quad Weight  4 lbs.    Ball Squeeze  15 x fast, 15 slow    Clamshell with TheraBand  Green 25x seated, standing 10 feet 3x    Hamstring Curl  Strengthening;Both;2 sets;10 reps;Weights standing on black pad     Hamstring Weights  4 lbs.    Sit to Sand  2 sets;10 reps;without UE support Ascent fast, descend slow VC               PT Short Term Goals - 11/04/17 0909      PT SHORT TERM GOAL #2   Title  Pt will be able to complete atleast 20 repetitions of SLR without extension lag, which will improve her safety with ambulation and other activity.     Time  3    Period  Weeks    Status  Achieved Can do 2x10      PT SHORT TERM GOAL #3   Title  Pt will demo Lt knee AROM flexion to atlast 100 deg which will help with technique during sit to stand.     Time  3    Period  Weeks    Status  Achieved 113 degrees      PT SHORT TERM GOAL #5   Title  Pt will demo proper mechanics with sit to stand, without significant weight shift Lt and no more than 1 UE support for safety.     Time  3    Period  Weeks    Status  Achieved        PT Long Term Goals - 12/24/17 2956      PT LONG TERM GOAL #1   Title  independent with HEP    Time  4    Period  Weeks    Status  On-going    Target Date  01/21/18      PT LONG TERM GOAL #2   Title  demonstrate improved functional strength by performing 5x STS without UE support in < 15 sec    Baseline  15.19- no hands    Time  4    Period  Weeks    Status  On-going    Target Date  01/21/18      PT LONG TERM GOAL #3   Title  improve functional endurance by performing 3MWT with at least 700'     Baseline  640 feet    Time  4    Period  Weeks    Status  On-going    Target Date  01/21/18      PT LONG TERM GOAL #4   Title  FOTO score improved to </= 40% limitation for improved function    Baseline  40% limitation    Status  Achieved      PT LONG TERM GOAL #5   Title  report pain < 4/10 with activity for improved  mobility and function    Baseline  no knee pain reported    Status  Achieved      PT LONG TERM GOAL #6   Title  The patient will have the stamina  to walk in the grocery store 10-15 minutes.    Baseline  15-20 minutes     Status  Achieved            Plan - 12/24/17 0951    Clinical Impression Statement  Pt is making steady endurance  and strength gains s/p significant complications with Rt TKA.  Pt improved distance for 3 minute walk test to 640 feet.  Pt with fatigue and mild shortness of breath after walking.  Pt with continued wide base of support but this has improved significantly.  Pt demonstrates heel strike and reduced medial/lateral trunk motion. Pt is able to walk for 20 minutes in the grocery store.  Pt with improved overall bil knee and hip strength.  Pt will continue 5 addtional visits to improve strength and endurance.      Rehab Potential  Good    PT Frequency  2x / week    PT Duration  4 weeks    PT Treatment/Interventions  ADLs/Self Care Home Management;Cryotherapy;Moist Heat;Therapeutic exercise;Therapeutic activities;Functional mobility training;Stair training;Gait training;Balance training;Neuromuscular re-education;Patient/family education;Manual techniques;Vasopneumatic Device;Taping;Passive range of motion    PT Next Visit Plan  LE strength, gait, balance and endurance- 5 more visits    PT Home Exercise Plan  Access Code: [email or web address]     Consulted and Agree with Plan of Care  Patient       Patient will benefit from skilled therapeutic intervention in order to improve the following deficits and impairments:  Abnormal gait, Pain, Decreased strength, Decreased activity tolerance, Decreased balance, Decreased mobility, Decreased range of motion  Visit Diagnosis: Acute pain of right knee - Plan: PT plan of care cert/re-cert  Stiffness of right knee, not elsewhere classified - Plan: PT plan of care cert/re-cert  Muscle weakness (generalized) - Plan: PT plan of care  cert/re-cert  Other abnormalities of gait and mobility - Plan: PT plan of care cert/re-cert     Problem List Patient Active Problem List   Diagnosis Date Noted  . Wound, open with complication 31/51/7616  . Fever   . Anemia due to chemotherapy 05/07/2017  . Abnormal LFTs 05/07/2017  . History of breast cancer   . Bilateral leg edema   . Enterococcal infection   . S/P revision of total knee, right 04/27/2017  . Pyogenic arthritis of right knee joint ([place])   . Acute blood loss anemia 12/12/2016  . Right knee abx spacer 12/02/2016  . Physical exam 07/31/2016  . Allergic reaction 07/24/2016  . Drug rash 07/24/2016  . S/P total knee arthroplasty, right 05/31/2016  . 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 Nonhealing surgical wound 05/28/2016  . Sepsis ([place]) 05/10/2016  . UTI (urinary tract infection) 05/10/2016  . Cellulitis of right knee 05/10/2016  . Blood clot in vein   . Wound dehiscence, surgical 04/12/2016  . 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 Vitamin D deficiency 01/25/2016  . 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 Primary osteoarthritis of right knee 02/01/2015  . Symptomatic anemia 02/01/2015  . Frequent UTI 01/12/2014  . Sinus tachycardia 11/03/2013  . SOB (shortness of breath) 11/03/2013  . Postoperative anemia due to acute blood loss 12/24/2012  .    09/23/2012  . Sinusitis 07/28/2012  . Neuropathy due to chemotherapeutic drug ([place]) 07/25/2011  . 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 Metastatic breast cancer ([place]) 11/12/2010  . Chronic anticoagulation 11/12/2010  . Anxiety, generalized 03/26/2008    Sigurd Sos, PT 12/24/17 10:11 AM  [facility] [address], [place], [place], [address] Phone: [phone]   Fax:  [phone]  Name: [name] MRN: [id number] Date of Birth: [birth year]

## 2017-12-28 MED FILL — DOXYCYCLINE MONO 100 MG CAP: 100 | 30 days supply | Qty: 60 | Fill #6

## 2017-12-28 MED FILL — ELIQUIS 5 MG TABLET: 5 | 30 days supply | Qty: 60 | Fill #3

## 2017-12-30 ENCOUNTER — Ambulatory Visit: Payer: 59 | Admitting: Physical Therapy

## 2017-12-30 ENCOUNTER — Encounter: Payer: Self-pay | Admitting: Physical Therapy

## 2017-12-30 DIAGNOSIS — M6281 Muscle weakness (generalized): Secondary | ICD-10-CM | POA: Diagnosis not present

## 2017-12-30 DIAGNOSIS — R2689 Other abnormalities of gait and mobility: Secondary | ICD-10-CM

## 2017-12-30 DIAGNOSIS — R600 Localized edema: Secondary | ICD-10-CM

## 2017-12-30 DIAGNOSIS — M25661 Stiffness of right knee, not elsewhere classified: Secondary | ICD-10-CM

## 2017-12-30 DIAGNOSIS — M25561 Pain in right knee: Secondary | ICD-10-CM

## 2017-12-30 NOTE — Therapy (Signed)
[facility] [address] [place], [place], [address] Phone: [phone]   Fax:  [phone]  Physical Therapy Treatment  Patient Details  Name: [name] MRN: [id number] Date of Birth: 01/17/1956 Referring Provider: Dr. Paralee Cancel   Encounter Date: 12/30/2017  PT End of Session - 12/30/17 1102    Visit Number  25    Number of Visits  36    Date for PT Re-Evaluation  01/21/18    Authorization Type  [facility] U[employer] (prior auth required for secondary insurance); 24 visits total approved (9 used prior to 09/02/17).  12 additional approved    PT Start Time  1045    PT Stop Time  1125    PT Time Calculation (min)  40 min    Activity Tolerance  Patient tolerated treatment well    Behavior During Therapy  WFL for tasks assessed/performed       Past Medical History:  Diagnosis Date  . Arthritis   . 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 Heart murmur   . 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 MVP (mitral valve prolapse)    mild mvp w/ mild regurg. per last echo 04-24-2017  . Neuropathy due to chemotherapeutic drug ([place])   . Non-healing surgical wound    right knee post re-implantation  total knee arthroplasty 04-2017 unable to completely close surgical incision  . PONV (postoperative nausea and vomiting)    ponv likes scopolamine patch  . Port-A-Cath in place    RIGHT CHEST   . Red blood cell antibody positive   . Skin changes related to chemotherapy    per patient she has scabbed over skin circumventing port to right upper chest ;  state it is due to chemptherapy   . 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 APPLICATION OF A-CELL OF BACK Right 07/31/2016   Procedure: CELLERATE COLLAGEN PLACEMENT;  Surgeon: Loel Lofty Dillingham, DO;  Location: [place];  Service: Plastics;  Laterality: Right;  . APPLICATION OF WOUND VAC Right 06/29/2017   Procedure: APPLICATION OF WOUND VAC;  Surgeon: Wallace Going, DO;  Location: [facility];  Service: Plastics;  Laterality: Right;  . DEBRIDEMENT CHEST WALL RIGHT/ VAC PLACEMENT  02-09-2015    [facility]  . EXCISIONAL TOTAL KNEE ARTHROPLASTY WITH ANTIBIOTIC SPACERS Right 12/02/2016   Procedure: EXCISIONAL TOTAL KNEE ARTHROPLASTY WITH ANTIBIOTIC SPACERS;  Surgeon: Paralee Cancel, MD;  Location[facility];  Service: Orthopedics;  Laterality: Right;  90 mins  . HEMATOMA EVACUATION Right 06/29/2017  Procedure: EVACUATION HEMATOMA OF RIGHT KNEE;  Surgeon: Wallace Going, DO;  Location: [facility];  Service: Plastics;  Laterality: Right;  . HEMATOMA EVACUATION Right 06/29/2017   Procedure: EVACUATION RIGHT TOTAL KNEE HEMATOMA;  Surgeon: Paralee Cancel, MD;  [facility];  Service: Orthopedics;  Laterality: Right;  . HEMATOMA EVACUATION Right 07/14/2017   Procedure: EVACUATION RIGHT LEG HEMATOMA WITH APPLICATION OF WOUND VAC;  Surgeon: Paralee Cancel, MD;  [facility];  Service: Orthopedics;  Laterality: Right;  . hemotoma     evacuation left chest wall  . I&D EXTREMITY Right 07/17/2017    Procedure: EVACUATION RIGHT LEG HEMATOMA WITH WOUND VAC DRESSING CHANGE;  Surgeon: Paralee Cancel, MD;  [facility];  Service: Orthopedics;  Laterality: Right;  . I&D KNEE WITH POLY EXCHANGE Right 05/28/2016   Procedure: IRRIGATION AND DEBRIDEMENT KNEE WOUND VAC PLACMENT;  Surgeon: Susa Day, MD;  [facility];  Service: Orthopedics;  Laterality: Right;  . I&D KNEE WITH POLY EXCHANGE Right 05/30/2016   Procedure: RADICAL SYNOVECTOMY,IRRIGATION AND DEBRIDEMENT KNEE WITH POLY EXCHANGE WITH ANTIBIOTIC BEADS, APPLICATION OF WOUND VAC;  Surgeon: Susa Day, MD;  [facility];  Service: Orthopedics;  Laterality: Right;  . INCISION AND DRAINAGE OF WOUND Right 07/31/2016   Procedure: IRRIGATION AND DEBRIDEMENT RIGHT KNEE  WOUND;  Surgeon: Loel Lofty Dillingham, DO;  Location: [place];  Service: Plastics;  Laterality: Right;  . IR FLUORO GUIDE CV LINE LEFT  04/30/2017  . IR US GUIDE VASC ACCESS LEFT  04/30/2017  . IRRIGATION AND DEBRIDEMENT KNEE Right 04/12/2016   Procedure: IRRIGATION AND DEBRIDEMENT KNEE;  Surgeon: Nicholes Stairs, MD;  [facility];  Service: Orthopedics;  Laterality: Right;  . IRRIGATION AND DEBRIDEMENT KNEE Right 12/16/2016   Procedure: Repeat irrigation and debridement right knee, wound closure  wound vac REPLACEMENT ANTIBIOTIC SPACERS;  Surgeon: Paralee Cancel, MD;  Location[facility];  Service: Orthopedics;  Laterality: Right;  . KNEE ARTHROSCOPY  02/13/2012   Procedure: ARTHROSCOPY KNEE;  Surgeon: Johnn Hai, MD;  Location: [facility];  Service: Orthopedics;  Laterality: Left;  WITH DEBRIDEMENt   . KNEE ARTHROSCOPY WITH LATERAL MENISECTOMY  02/13/2012   Procedure: KNEE ARTHROSCOPY WITH LATERAL MENISECTOMY;  Surgeon: Johnn Hai, MD;  Location: [facility];  Service: Orthopedics;;  partial  . LATISSIMUS FLAP RIGHT CHEST  02-23-2015   [facility]  . LEFT MODIFIED RADICAL MASTECTOMY/ RIGHT TOTAL MASTECTOMY  11-13-2007   LEFT BREAST CANCER  W/ AXILLARY LYMPH NODE METASTASIS AND POST NEOADJUVANT CHEMO  . MASS EXCISION Right 06/29/2017   Procedure: EXCISION OF METASTATIC BREAST CANCER TO SKIN RIGHT SHOULDER, PLACEMENT OF CELLERATE;  Surgeon: Wallace Going, DO;  [facility];  Service: Plastics;  Laterality: Right;  . PLACEMENT PORT-A-CATH  06/22/2007    new port placed 2015, right chest  . REIMPLANTATION OF TOTAL KNEE Right 04/27/2017   Procedure: Reimplantation of right total knee arthroplasty;  Surgeon: Paralee Cancel, MD;  [facility];  Service: Orthopedics;  Laterality: Right;  Adductor Block  . RESECTION OF ISOLATED CHEST WALL DISEASE/ ABDOMINAL SKIN FLAP  02/ 2015     [facility]   RIGHT CHEST WALL METASTATIC SKIN CARCINOMA  . SKIN SPLIT GRAFT Right 01/29/2017   Procedure: SKIN GRAFT SPLIT THICKNESS TO RIGHT KNEE WOUND;  Surgeon: Wallace Going, DO;  Location[facility];  Service: Plastics;  Laterality: Right;  . SKIN SPLIT GRAFT Right 09/07/2017   Procedure: SKIN GRAFT SPLIT THICKNESS TO RIGHT KNEE  WOUND WITH PLACEMENT OF VAC DRESSING TO GRAFT SITE;  Surgeon: Wallace Going, DO;  Location: [place];  Service: Plastics;  Laterality: Right;  . TOTAL KNEE ARTHROPLASTY Left 09/23/2012   Procedure: LEFT TOTAL KNEE ARTHROPLASTY;  Surgeon: Johnn Hai, MD;  Location[facility];  Service: Orthopedics;  Laterality: Left;  . TOTAL KNEE ARTHROPLASTY Right 03/20/2016   Procedure: RIGHT TOTAL KNEE ARTHROPLASTY;  Surgeon: Susa Day, MD;  [facility];  Service: Orthopedics;  Laterality: Right;  . TRANSTHORACIC ECHOCARDIOGRAM  04/24/2017   ef 71-69%, grade 1 diastolic dysfuction/  mild MR with mild prolapse anterior leaflet/ mild TR/ PASP 22mmHg    There were no vitals filed for this visit.  Subjective Assessment - 12/30/17 1103    Subjective  I got my weights for home. I wa sable to use them this weekend. All went well.     Currently in Pain?  No/denies    Multiple Pain Sites  No                        [facility] Adult PT Treatment/Exercise - 12/30/17 0001      Knee/Hip Exercises: Aerobic   Nustep  L3 x 15 min VC to keep SPM up > 100      Knee/Hip Exercises: Standing   Knee Flexion  AAROM;Both;2 sets;15 reps    Knee Flexion Limitations  4#    Hip Abduction  Stengthening;Both;2 sets;15 reps;Knee straight    Abduction Limitations  4#    Hip Extension  Stengthening;Both;2 sets;15 reps;Knee straight    Extension Limitations  4#    Other Standing Knee Exercises  side stepping with green band 20 ft 2x      Knee/Hip Exercises: Seated   Long Arc Quad  Strengthening;Both;3 sets;Weights    Long Arc Quad Weight  4 lbs.    Clamshell with TheraBand  Blue 25x    Marching  Strengthening;Both;2 sets;15 reps;Weights    Marching Limitations  4    Sit to Sand  2 sets;10 reps;without UE support Ascent fast, descend slow VC             PT Education - 12/30/17 1122    Education provided  Yes    Education Details  standing side stepping with green band: issued band  for home    Person(s) Educated  Patient    Methods  Explanation;Demonstration;Tactile cues;Verbal cues    Comprehension  Returned demonstration       PT Short Term Goals - 11/04/17 0909      PT SHORT TERM GOAL #2   Title  Pt will be able to complete atleast 20 repetitions of SLR without extension lag, which will improve her safety with ambulation and other activity.     Time  3    Period  Weeks    Status  Achieved Can do 2x10      PT SHORT TERM GOAL #3   Title  Pt will demo Lt knee AROM flexion to atlast 100 deg which will help with technique during sit to stand.     Time  3    Period  Weeks    Status  Achieved 113 degrees      PT SHORT TERM GOAL #5   Title  Pt will demo proper mechanics with sit to stand, without significant weight shift Lt and no more than 1 UE support for safety.     Time  3  Period  Weeks    Status  Achieved        PT Long Term Goals - 12/24/17 0939      PT  LONG TERM GOAL #1   Title  independent with HEP    Time  4    Period  Weeks    Status  On-going    Target Date  01/21/18      PT LONG TERM GOAL #2   Title  demonstrate improved functional strength by performing 5x STS without UE support in < 15 sec    Baseline  15.19- no hands    Time  4    Period  Weeks    Status  On-going    Target Date  01/21/18      PT LONG TERM GOAL #3   Title  improve functional endurance by performing 3MWT with at least 700'     Baseline  640 feet    Time  4    Period  Weeks    Status  On-going    Target Date  01/21/18      PT LONG TERM GOAL #4   Title  FOTO score improved to </= 40% limitation for improved function    Baseline  40% limitation    Status  Achieved      PT LONG TERM GOAL #5   Title  report pain < 4/10 with activity for improved mobility and function    Baseline  no knee pain reported    Status  Achieved      PT LONG TERM GOAL #6   Title  The patient will have the stamina  to walk in the grocery store 10-15 minutes.    Baseline  15-20 minutes     Status  Achieved            Plan - 12/30/17 1102    Clinical Impression Statement  Pt purchased her weights for home and has begun using them with success. We added green band side stepping to her HEP for her side body strength as wel as balance. She continues to do well with her ther-ex. Sit to stand apears faster with improved control on the eccentrics.     Rehab Potential  Good    PT Frequency  2x / week    PT Duration  4 weeks    PT Treatment/Interventions  ADLs/Self Care Home Management;Cryotherapy;Moist Heat;Therapeutic exercise;Therapeutic activities;Functional mobility training;Stair training;Gait training;Balance training;Neuromuscular re-education;Patient/family education;Manual techniques;Vasopneumatic Device;Taping;Passive range of motion    PT Next Visit Plan  LE strength, gait, balance and endurance- 5 more visits    PT Home Exercise Plan  Access Code: [email or web address]      Consulted and Agree with Plan of Care  Patient       Patient will benefit from skilled therapeutic intervention in order to improve the following deficits and impairments:  Abnormal gait, Pain, Decreased strength, Decreased activity tolerance, Decreased balance, Decreased mobility, Decreased range of motion  Visit Diagnosis: Acute pain of right knee  Stiffness of right knee, not elsewhere classified  Muscle weakness (generalized)  Other abnormalities of gait and mobility  Localized edema     Problem List Patient Active Problem List   Diagnosis Date Noted  . Wound, open with complication 92/42/6834  . Fever   . Anemia due to chemotherapy 05/07/2017  . Abnormal LFTs 05/07/2017  . History of breast cancer   . Bilateral leg edema   . Enterococcal infection   . S/P revision of total  knee, right 04/27/2017  . Pyogenic arthritis of right knee joint ([place])   . Acute blood loss anemia 12/12/2016  . Right knee abx spacer 12/02/2016  . Physical exam 07/31/2016  . Allergic reaction 07/24/2016  . Drug rash 07/24/2016  . S/P total knee arthroplasty, right 05/31/2016  . 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 Nonhealing surgical wound 05/28/2016  . Sepsis ([place]) 05/10/2016  . UTI (urinary tract infection) 05/10/2016  . Cellulitis of right knee 05/10/2016  . Blood clot in vein   . Wound dehiscence, surgical 04/12/2016  . 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 Vitamin D deficiency 01/25/2016  . 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 Primary osteoarthritis of right knee 02/01/2015  . Symptomatic anemia 02/01/2015  . Frequent UTI 01/12/2014  . Sinus tachycardia 11/03/2013  . SOB (shortness of breath) 11/03/2013  . Postoperative anemia due to acute blood loss 12/24/2012  .    09/23/2012  . Sinusitis 07/28/2012  . Neuropathy due to chemotherapeutic drug ([place]) 07/25/2011  . 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 Metastatic breast cancer ([place]) 11/12/2010  . Chronic  anticoagulation 11/12/2010  . Anxiety, generalized 03/26/2008    Tihanna Goodson, PTA 12/30/2017, 11:28 AM  [facility] [address] [place], [place], [address] Phone: [phone]   Fax:  [phone]  Name: KRYSTAL TEACHEY MRN: [id number] Date of Birth: Sep 11, 1955

## 2017-12-31 ENCOUNTER — Encounter: Payer: Self-pay | Admitting: Family Medicine

## 2017-12-31 DIAGNOSIS — C792 Secondary malignant neoplasm of skin: Secondary | ICD-10-CM | POA: Diagnosis not present

## 2017-12-31 DIAGNOSIS — C50911 Malignant neoplasm of unspecified site of right female breast: Secondary | ICD-10-CM | POA: Diagnosis not present

## 2018-01-01 ENCOUNTER — Ambulatory Visit: Payer: 59 | Admitting: Physical Therapy

## 2018-01-01 ENCOUNTER — Encounter: Payer: Self-pay | Admitting: Physical Therapy

## 2018-01-01 DIAGNOSIS — M6281 Muscle weakness (generalized): Secondary | ICD-10-CM | POA: Diagnosis not present

## 2018-01-01 DIAGNOSIS — R2689 Other abnormalities of gait and mobility: Secondary | ICD-10-CM | POA: Diagnosis not present

## 2018-01-01 DIAGNOSIS — M25661 Stiffness of right knee, not elsewhere classified: Secondary | ICD-10-CM | POA: Diagnosis not present

## 2018-01-01 DIAGNOSIS — M25561 Pain in right knee: Secondary | ICD-10-CM

## 2018-01-01 DIAGNOSIS — R600 Localized edema: Secondary | ICD-10-CM | POA: Diagnosis not present

## 2018-01-01 NOTE — Therapy (Signed)
[facility] [address], [place], [place], [address] Phone: [phone]   Fax:  [phone]  Physical Therapy Treatment  Patient Details  Name: [name] MRN: [id number] Date of Birth: 1956-04-10 Referring Provider: Dr. Paralee Cancel   Encounter Date: 01/01/2018  PT End of Session - 01/01/18 0925    Visit Number  26    Number of Visits  36    Date for PT Re-Evaluation  01/21/18    Authorization Type  [facility] [employer] (prior auth required for secondary insurance); 24 visits total approved (9 used prior to 09/02/17).  12 additional approved    Authorization - Number of Visits  36    PT Start Time  0920    PT Stop Time  1001    PT Time Calculation (min)  41 min    Activity Tolerance  Patient tolerated treatment well    Behavior During Therapy  WFL for tasks assessed/performed       Past Medical History:  Diagnosis Date  . Arthritis   . 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 Heart murmur   . 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 MVP (mitral valve prolapse)    mild mvp w/ mild regurg. per last echo 04-24-2017  . Neuropathy due to chemotherapeutic drug ([place])   . Non-healing surgical  wound    right knee post re-implantation total knee arthroplasty 04-2017 unable to completely close surgical incision  . PONV (postoperative nausea and vomiting)    ponv likes scopolamine patch  . Port-A-Cath in place    RIGHT CHEST   . Red blood cell antibody positive   . Skin changes related to chemotherapy    per patient she has scabbed over skin circumventing port to right upper chest ;  state it is due to chemptherapy   . 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 APPLICATION OF A-CELL OF BACK Right 07/31/2016   Procedure: CELLERATE COLLAGEN PLACEMENT;  Surgeon: Loel Lofty Dillingham, DO;  Location: [place];  Service: Plastics;  Laterality: Right;  . APPLICATION OF WOUND VAC Right 06/29/2017   Procedure: APPLICATION OF WOUND VAC;  Surgeon: Wallace Going, DO;  Location: [facility];  Service: Plastics;  Laterality: Right;  . DEBRIDEMENT CHEST WALL RIGHT/ VAC PLACEMENT  02-09-2015    [facility]  . EXCISIONAL TOTAL KNEE ARTHROPLASTY WITH ANTIBIOTIC SPACERS Right 12/02/2016   Procedure: EXCISIONAL TOTAL KNEE ARTHROPLASTY WITH ANTIBIOTIC SPACERS;  Surgeon: Paralee Cancel, MD;  Location[facility];  Service: Orthopedics;  Laterality: Right;  90 mins  . HEMATOMA EVACUATION Right 06/29/2017   Procedure: EVACUATION HEMATOMA OF RIGHT KNEE;  Surgeon: Wallace Going, DO;  [facility];  Service: Plastics;  Laterality: Right;  . HEMATOMA EVACUATION Right 06/29/2017   Procedure: EVACUATION RIGHT TOTAL KNEE HEMATOMA;  Surgeon: Paralee Cancel, MD;  [facility];  Service: Orthopedics;  Laterality: Right;  . HEMATOMA EVACUATION Right 07/14/2017   Procedure: EVACUATION RIGHT LEG HEMATOMA WITH APPLICATION OF WOUND VAC;  Surgeon: Paralee Cancel, MD;  [facility];  Service: Orthopedics;  Laterality: Right;  . hemotoma     evacuation left chest  wall  . I&D EXTREMITY Right 07/17/2017   Procedure: EVACUATION RIGHT LEG HEMATOMA WITH WOUND VAC DRESSING CHANGE;  Surgeon: Paralee Cancel, MD;  [facility];  Service: Orthopedics;  Laterality: Right;  . I&D KNEE WITH POLY EXCHANGE Right 05/28/2016   Procedure: IRRIGATION AND DEBRIDEMENT KNEE WOUND VAC PLACMENT;  Surgeon: Susa Day, MD;  [facility];  Service: Orthopedics;  Laterality: Right;  . I&D KNEE WITH POLY EXCHANGE Right 05/30/2016   Procedure: RADICAL SYNOVECTOMY,IRRIGATION AND DEBRIDEMENT KNEE WITH POLY EXCHANGE WITH ANTIBIOTIC BEADS, APPLICATION OF WOUND VAC;  Surgeon: Susa Day, MD;  [facility];  Service: Orthopedics;  Laterality: Right;  . INCISION AND DRAINAGE OF WOUND Right 07/31/2016   Procedure: IRRIGATION AND DEBRIDEMENT RIGHT KNEE  WOUND;  Surgeon: Loel Lofty Dillingham, DO;  Location: [place];  Service: Plastics;  Laterality: Right;  . IR FLUORO GUIDE CV LINE LEFT  04/30/2017  . IR US GUIDE VASC ACCESS LEFT  04/30/2017  . IRRIGATION AND DEBRIDEMENT KNEE Right 04/12/2016   Procedure: IRRIGATION AND DEBRIDEMENT KNEE;  Surgeon: Nicholes Stairs, MD;  [facility];  Service: Orthopedics;  Laterality: Right;  . IRRIGATION AND DEBRIDEMENT KNEE Right 12/16/2016   Procedure: Repeat irrigation and debridement right knee, wound closure  wound vac REPLACEMENT ANTIBIOTIC SPACERS;  Surgeon: Paralee Cancel, MD;  [facility];  Service: Orthopedics;  Laterality: Right;  . KNEE ARTHROSCOPY  02/13/2012   Procedure: ARTHROSCOPY KNEE;  Surgeon: Johnn Hai, MD;  Location: [facility];  Service: Orthopedics;  Laterality: Left;  WITH DEBRIDEMENt   . KNEE ARTHROSCOPY WITH LATERAL MENISECTOMY  02/13/2012   Procedure: KNEE ARTHROSCOPY WITH LATERAL MENISECTOMY;  Surgeon: Johnn Hai, MD;  Location: [facility];  Service: Orthopedics;;  partial  . LATISSIMUS FLAP RIGHT CHEST  02-23-2015   [facility]  . LEFT MODIFIED RADICAL MASTECTOMY/ RIGHT TOTAL  MASTECTOMY  11-13-2007   LEFT BREAST CANCER W/ AXILLARY LYMPH NODE METASTASIS AND POST NEOADJUVANT CHEMO  . MASS EXCISION Right 06/29/2017   Procedure: EXCISION OF METASTATIC BREAST CANCER TO SKIN RIGHT SHOULDER, PLACEMENT OF CELLERATE;  Surgeon: Wallace Going, DO;  [facility];  Service: Plastics;  Laterality: Right;  . PLACEMENT PORT-A-CATH  06/22/2007    new port placed 2015, right chest  . REIMPLANTATION OF TOTAL KNEE Right 04/27/2017   Procedure: Reimplantation of right total knee arthroplasty;  Surgeon: Paralee Cancel, MD;  [facility];  Service: Orthopedics;  Laterality: Right;  Adductor Block  . RESECTION OF ISOLATED CHEST WALL DISEASE/ ABDOMINAL SKIN FLAP  02/ 2015     [facility]   RIGHT CHEST WALL METASTATIC SKIN CARCINOMA  . SKIN SPLIT GRAFT Right 01/29/2017   Procedure: SKIN GRAFT SPLIT THICKNESS TO RIGHT KNEE WOUND;  Surgeon: Wallace Going, DO;  [facility];  Service: Plastics;  Laterality: Right;  . SKIN SPLIT GRAFT Right 09/07/2017  Procedure: SKIN GRAFT SPLIT THICKNESS TO RIGHT KNEE WOUND WITH PLACEMENT OF VAC DRESSING TO GRAFT SITE;  Surgeon: Wallace Going, DO;  Location: [place];  Service: Plastics;  Laterality: Right;  . TOTAL KNEE ARTHROPLASTY Left 09/23/2012   Procedure: LEFT TOTAL KNEE ARTHROPLASTY;  Surgeon: Johnn Hai, MD;  [facility];  Service: Orthopedics;  Laterality: Left;  . TOTAL KNEE ARTHROPLASTY Right 03/20/2016   Procedure: RIGHT TOTAL KNEE ARTHROPLASTY;  Surgeon: Susa Day, MD;  [facility];  Service: Orthopedics;  Laterality: Right;  . TRANSTHORACIC ECHOCARDIOGRAM  04/24/2017   ef 84-69%, grade 1 diastolic dysfuction/  mild MR with mild prolapse anterior leaflet/ mild TR/ PASP 37mmHg    There were no vitals filed for this visit.  Subjective Assessment - 01/01/18 0926    Subjective  I was a little sore after last sesison but not bad.     Limitations  Standing;Walking    Currently in Pain?  Yes     Pain Score  2     Pain Location  Knee    Pain Orientation  Right    Pain Descriptors / Indicators  Sore    Aggravating Factors   Overdoing it    Pain Relieving Factors  ice    Multiple Pain Sites  No                       [facility] Adult PT Treatment/Exercise - 01/01/18 0001      Knee/Hip Exercises: Aerobic   Nustep  L3 x 15 min   VC to keep SPM up > 100     Knee/Hip Exercises: Standing   Knee Flexion  AAROM;Both;2 sets;15 reps    Knee Flexion Limitations  4#    Hip Abduction  Stengthening;Both;2 sets;15 reps;Knee straight    Abduction Limitations  4#    Hip Extension  Stengthening;Both;2 sets;15 reps;Knee straight    Extension Limitations  4#    Other Standing Knee Exercises  side stepping with blue band 20 ft 2x      Knee/Hip Exercises: Seated   Long Arc Quad  Strengthening;Both;3 sets;Weights    Long Arc Quad Weight  4 lbs.    Clamshell with TheraBand  Blue   25x   Marching  Strengthening;Both;2 sets;15 reps;Weights    Marching Limitations  4    Sit to Sand  2 sets;10 reps;without UE support   Ascent fast, descend slow VC              PT Short Term Goals - 11/04/17 0909      PT SHORT TERM GOAL #2   Title  Pt will be able to complete atleast 20 repetitions of SLR without extension lag, which will improve her safety with ambulation and other activity.     Time  3    Period  Weeks    Status  Achieved   Can do 2x10     PT SHORT TERM GOAL #3   Title  Pt will demo Lt knee AROM flexion to atlast 100 deg which will help with technique during sit to stand.     Time  3    Period  Weeks    Status  Achieved   113 degrees     PT SHORT TERM GOAL #5   Title  Pt will demo proper mechanics with sit to stand, without significant weight shift Lt and no more than 1 UE support for safety.  Time  3    Period  Weeks    Status  Achieved        PT Long Term Goals - 12/24/17 2119      PT LONG TERM GOAL #1   Title  independent with HEP    Time  4     Period  Weeks    Status  On-going    Target Date  01/21/18      PT LONG TERM GOAL #2   Title  demonstrate improved functional strength by performing 5x STS without UE support in < 15 sec    Baseline  15.19- no hands    Time  4    Period  Weeks    Status  On-going    Target Date  01/21/18      PT LONG TERM GOAL #3   Title  improve functional endurance by performing 3MWT with at least 700'     Baseline  640 feet    Time  4    Period  Weeks    Status  On-going    Target Date  01/21/18      PT LONG TERM GOAL #4   Title  FOTO score improved to </= 40% limitation for improved function    Baseline  40% limitation    Status  Achieved      PT LONG TERM GOAL #5   Title  report pain < 4/10 with activity for improved mobility and function    Baseline  no knee pain reported    Status  Achieved      PT LONG TERM GOAL #6   Title  The patient will have the stamina  to walk in the grocery store 10-15 minutes.    Baseline  15-20 minutes     Status  Achieved            Plan - 01/01/18 0925    Clinical Impression Statement  Pt reports getting a little sore with her exercises but it is completely tolerable. Increased band resistance today with standing side stepping. Pt reports she has developed a good home routine wiith her exercises and will be well prepared when she finishes her PT.      Rehab Potential  Good    PT Frequency  2x / week    PT Duration  4 weeks    PT Treatment/Interventions  ADLs/Self Care Home Management;Cryotherapy;Moist Heat;Therapeutic exercise;Therapeutic activities;Functional mobility training;Stair training;Gait training;Balance training;Neuromuscular re-education;Patient/family education;Manual techniques;Vasopneumatic Device;Taping;Passive range of motion    PT Next Visit Plan  LE strength, gait, balance and endurance- 3 more visits    PT Home Exercise Plan  Access Code: [email or web address]     Consulted and Agree with Plan of Care  Patient       Patient will benefit  from skilled therapeutic intervention in order to improve the following deficits and impairments:  Abnormal gait, Pain, Decreased strength, Decreased activity tolerance, Decreased balance, Decreased mobility, Decreased range of motion  Visit Diagnosis: Acute pain of right knee  Stiffness of right knee, not elsewhere classified  Muscle weakness (generalized)  Other abnormalities of gait and mobility  Localized edema     Problem List Patient Active Problem List   Diagnosis Date Noted  . Wound, open with complication 41/74/0814  . Fever   . Anemia due to chemotherapy 05/07/2017  . Abnormal LFTs 05/07/2017  . History of breast cancer   . Bilateral leg edema   . Enterococcal infection   . S/P revision of  total knee, right 04/27/2017  . Pyogenic arthritis of right knee joint ([place])   . Acute blood loss anemia 12/12/2016  . Right knee abx spacer 12/02/2016  . Physical exam 07/31/2016  . Allergic reaction 07/24/2016  . Drug rash 07/24/2016  . S/P total knee arthroplasty, right 05/31/2016  . 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 Nonhealing surgical wound 05/28/2016  . Sepsis ([place]) 05/10/2016  . UTI (urinary tract infection) 05/10/2016  . Cellulitis of right knee 05/10/2016  . Blood clot in vein   . Wound dehiscence, surgical 04/12/2016  . 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 Vitamin D deficiency 01/25/2016  . 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 Primary osteoarthritis of right knee 02/01/2015  . Symptomatic anemia 02/01/2015  . Frequent UTI 01/12/2014  . Sinus tachycardia 11/03/2013  . SOB (shortness of breath) 11/03/2013  . Postoperative anemia due to acute blood loss 12/24/2012  .    09/23/2012  . Sinusitis 07/28/2012  . Neuropathy due to chemotherapeutic drug ([place]) 07/25/2011  . 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 Metastatic breast cancer ([place]) 11/12/2010  . Chronic anticoagulation 11/12/2010  . Anxiety, generalized 03/26/2008     French Kendra,PTA 01/01/2018, 10:00 AM   [facility] [address] [place], [place], [address] Phone: [phone]   Fax:  [phone]  Name: [name] MRN: [id number] Date of Birth: [birth year]

## 2018-01-05 ENCOUNTER — Ambulatory Visit: Payer: 59

## 2018-01-05 DIAGNOSIS — R600 Localized edema: Secondary | ICD-10-CM | POA: Diagnosis not present

## 2018-01-05 DIAGNOSIS — M25661 Stiffness of right knee, not elsewhere classified: Secondary | ICD-10-CM | POA: Diagnosis not present

## 2018-01-05 DIAGNOSIS — R2689 Other abnormalities of gait and mobility: Secondary | ICD-10-CM | POA: Diagnosis not present

## 2018-01-05 DIAGNOSIS — M6281 Muscle weakness (generalized): Secondary | ICD-10-CM

## 2018-01-05 DIAGNOSIS — M25561 Pain in right knee: Secondary | ICD-10-CM | POA: Diagnosis not present

## 2018-01-05 NOTE — Therapy (Signed)
[facility] [address], [place], [place], [address] Phone: [phone]   Fax:  [phone]  Physical Therapy Treatment  Patient Details  Name: [name] MRN: [id number] Date of Birth: 07/13/1955 Referring Provider: Dr. Paralee Cancel   Encounter Date: 01/05/2018  PT End of Session - 01/05/18 1232    Visit Number  27    Date for PT Re-Evaluation  01/21/18    Authorization Type  [facility] UMR/[employer] (prior auth required for secondary insurance); 24 visits total approved (9 used prior to 09/02/17).  12 additional approved    Authorization - Visit Number  34    Authorization - Number of Visits  36    PT Start Time  6160    PT Stop Time  1231    PT Time Calculation (min)  47 min    Activity Tolerance  Patient tolerated treatment well    Behavior During Therapy  WFL for tasks assessed/performed       Past Medical History:  Diagnosis Date  . Arthritis   . 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) followed by dr force -- oncolgoist w/ [place]   primary left breast cancer dx 01/ 2009 ; 11/ 2009 recurrence left chest wall and neck, tx clinical trial drug and chemotherapy;  2015 recurrence cutaneous metastatized to right skin/ chest wall, 02/ 2015 resection chest wall disease and 02-23-2015 s/p skin flap surgery,  chemo every 3 weeks  . 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 Heart murmur   . 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 MVP (mitral valve prolapse)    mild mvp w/ mild regurg. per last echo 04-24-2017  . Neuropathy due to chemotherapeutic drug ([place])   .  Non-healing surgical wound    right knee post re-implantation total knee arthroplasty 04-2017 unable to completely close surgical incision  . PONV (postoperative nausea and vomiting)    ponv likes scopolamine patch  . Port-A-Cath in place    RIGHT CHEST   . Red blood cell antibody positive   . Skin changes related to chemotherapy    per patient she has scabbed over skin circumventing port to right upper chest ;  state it is due to chemptherapy   . 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 APPLICATION OF A-CELL OF BACK Right 07/31/2016   Procedure: CELLERATE COLLAGEN PLACEMENT;  Surgeon: Loel Lofty Dillingham, DO;  Location: [place];  Service: Plastics;  Laterality: Right;  . APPLICATION OF WOUND VAC Right 06/29/2017   Procedure: APPLICATION OF WOUND VAC;  Surgeon: Wallace Going, DO;  Location: [facility];  Service: Plastics;  Laterality: Right;  . DEBRIDEMENT CHEST WALL RIGHT/ VAC PLACEMENT  02-09-2015    [facility]  . EXCISIONAL TOTAL KNEE ARTHROPLASTY WITH ANTIBIOTIC SPACERS Right 12/02/2016   Procedure: EXCISIONAL TOTAL KNEE ARTHROPLASTY WITH ANTIBIOTIC SPACERS;  Surgeon: Paralee Cancel, MD;  [facility];  Service: Orthopedics;  Laterality: Right;  90 mins  . HEMATOMA EVACUATION Right 06/29/2017   Procedure: EVACUATION HEMATOMA OF RIGHT KNEE;  Surgeon: Dillingham, Claire S, DO;  [facility]: [facility];  Service: Plastics;  Laterality: Right;  . HEMATOMA EVACUATION Right 06/29/2017   Procedure: EVACUATION RIGHT TOTAL KNEE HEMATOMA;  Surgeon: Olin, Matthew, MD;  [facility];  Service: Orthopedics;  Laterality: Right;  . HEMATOMA EVACUATION Right 07/14/2017   Procedure: EVACUATION RIGHT LEG HEMATOMA WITH APPLICATION OF WOUND VAC;  Surgeon: Olin, Matthew, MD;  [facility];  Service: Orthopedics;  Laterality: Right;  . hemotoma      evacuation left chest wall  . I&D EXTREMITY Right 07/17/2017   Procedure: EVACUATION RIGHT LEG HEMATOMA WITH WOUND VAC DRESSING CHANGE;  Surgeon: Olin, Matthew, MD;  [facility];  Service: Orthopedics;  Laterality: Right;  . I&D KNEE WITH POLY EXCHANGE Right 05/28/2016   Procedure: IRRIGATION AND DEBRIDEMENT KNEE WOUND VAC PLACMENT;  Surgeon: Jeffrey Beane, MD;  [facility];  Service: Orthopedics;  Laterality: Right;  . I&D KNEE WITH POLY EXCHANGE Right 05/30/2016   Procedure: RADICAL SYNOVECTOMY,IRRIGATION AND DEBRIDEMENT KNEE WITH POLY EXCHANGE WITH ANTIBIOTIC BEADS, APPLICATION OF WOUND VAC;  Surgeon: Jeffrey Beane, MD;  [facility];  Service: Orthopedics;  Laterality: Right;  . INCISION AND DRAINAGE OF WOUND Right 07/31/2016   Procedure: IRRIGATION AND DEBRIDEMENT RIGHT KNEE  WOUND;  Surgeon: Claire S Dillingham, DO;  [facility];  Service: Plastics;  Laterality: Right;  . IR FLUORO GUIDE CV LINE LEFT  04/30/2017  . IR US GUIDE VASC ACCESS LEFT  04/30/2017  . IRRIGATION AND DEBRIDEMENT KNEE Right 04/12/2016   Procedure: IRRIGATION AND DEBRIDEMENT KNEE;  Surgeon: Jason Patrick Rogers, MD;  [facility];  Service: Orthopedics;  Laterality: Right;  . IRRIGATION AND DEBRIDEMENT KNEE Right 12/16/2016   Procedure: Repeat irrigation and debridement right knee, wound closure  wound vac REPLACEMENT ANTIBIOTIC SPACERS;  Surgeon: Olin, Matthew, MD;  [facility];  Service: Orthopedics;  Laterality: Right;  . KNEE ARTHROSCOPY  02/13/2012   Procedure: ARTHROSCOPY KNEE;  Surgeon: Jeffrey C Beane, MD;  Location: [facility];  Service: Orthopedics;  Laterality: Left;  WITH DEBRIDEMENt   . KNEE ARTHROSCOPY WITH LATERAL MENISECTOMY  02/13/2012   Procedure: KNEE ARTHROSCOPY WITH LATERAL MENISECTOMY;  Surgeon: Jeffrey C Beane, MD;  Location: [facility];  Service: Orthopedics;;  partial  . LATISSIMUS FLAP RIGHT CHEST  02-23-2015   [facility]  . LEFT MODIFIED RADICAL  MASTECTOMY/ RIGHT TOTAL MASTECTOMY  11-13-2007   LEFT BREAST CANCER W/ AXILLARY LYMPH NODE METASTASIS AND POST NEOADJUVANT CHEMO  . MASS EXCISION Right 06/29/2017   Procedure: EXCISION OF METASTATIC BREAST CANCER TO SKIN RIGHT SHOULDER, PLACEMENT OF CELLERATE;  Surgeon: Dillingham, Claire S, DO;  [facility];  Service: Plastics;  Laterality: Right;  . PLACEMENT PORT-A-CATH  06/22/2007    new port placed 2015, right chest  . REIMPLANTATION OF TOTAL KNEE Right 04/27/2017   Procedure: Reimplantation of right total knee arthroplasty;  Surgeon: Olin, Matthew, MD;  [facility];  Service: Orthopedics;  Laterality: Right;  Adductor Block  . RESECTION OF ISOLATED CHEST WALL DISEASE/ ABDOMINAL SKIN FLAP  02/ 2015     [facility]   RIGHT CHEST WALL METASTATIC SKIN CARCINOMA  . SKIN SPLIT GRAFT Right 01/29/2017   Procedure: SKIN GRAFT SPLIT THICKNESS TO RIGHT KNEE WOUND;  Surgeon: Dillingham, Claire S, DO;  [facility];  Service: Plastics;  Laterality: Right;  . SKIN SPLIT GRAFT Right 09/07/2017     Procedure: SKIN GRAFT SPLIT THICKNESS TO RIGHT KNEE WOUND WITH PLACEMENT OF VAC DRESSING TO GRAFT SITE;  Surgeon: Wallace Going, DO;  Location: [place];  Service: Plastics;  Laterality: Right;  . TOTAL KNEE ARTHROPLASTY Left 09/23/2012   Procedure: LEFT TOTAL KNEE ARTHROPLASTY;  Surgeon: Johnn Hai, MD;  [facility];  Service: Orthopedics;  Laterality: Left;  . TOTAL KNEE ARTHROPLASTY Right 03/20/2016   Procedure: RIGHT TOTAL KNEE ARTHROPLASTY;  Surgeon: Susa Day, MD;  [facility];  Service: Orthopedics;  Laterality: Right;  . TRANSTHORACIC ECHOCARDIOGRAM  04/24/2017   ef 38-75%, grade 1 diastolic dysfuction/  mild MR with mild prolapse anterior leaflet/ mild TR/ PASP 73mmHg    There were no vitals filed for this visit.  Subjective Assessment - 01/05/18 1155    Subjective  I want to go to the gym after i am done here.      Currently in Pain?  No/denies   Just  stiffness                      [facility] Adult PT Treatment/Exercise - 01/05/18 0001      Exercises   Exercises  Knee/Hip      Knee/Hip Exercises: Aerobic   Nustep  L3 x 10 min   VC to keep SPM up > 100     Knee/Hip Exercises: Machines for Strengthening   Total Gym Leg Press  seat 7: 65# bil 2x15, Rt only 30# 2x15, Lt only 30# 2x15      Knee/Hip Exercises: Standing   Knee Flexion  AAROM;Both;2 sets;15 reps    Knee Flexion Limitations  4#    Hip Abduction  Stengthening;Both;2 sets;15 reps;Knee straight    Abduction Limitations  4#    Hip Extension  Stengthening;Both;2 sets;15 reps;Knee straight    Extension Limitations  4#      Knee/Hip Exercises: Seated   Long Arc Quad  Strengthening;Both;3 sets;Weights    Long Arc Quad Weight  4 lbs.    Clamshell with [employer]  --    Marching  Strengthening;Both;2 sets;15 reps;Weights    Marching Limitations  4    Sit to Sand  2 sets;10 reps;without UE support   Ascent fast, descend slow VC              PT Short Term Goals - 11/04/17 0909      PT SHORT TERM GOAL #2   Title  Pt will be able to complete atleast 20 repetitions of SLR without extension lag, which will improve her safety with ambulation and other activity.     Time  3    Period  Weeks    Status  Achieved   Can do 2x10     PT SHORT TERM GOAL #3   Title  Pt will demo Lt knee AROM flexion to atlast 100 deg which will help with technique during sit to stand.     Time  3    Period  Weeks    Status  Achieved   113 degrees     PT SHORT TERM GOAL #5   Title  Pt will demo proper mechanics with sit to stand, without significant weight shift Lt and no more than 1 UE support for safety.     Time  3    Period  Weeks    Status  Achieved        PT Long Term Goals - 12/24/17 6433  PT LONG TERM GOAL #1   Title  independent with HEP    Time  4    Period  Weeks    Status  On-going    Target Date  01/21/18      PT LONG TERM GOAL #2   Title   demonstrate improved functional strength by performing 5x STS without UE support in < 15 sec    Baseline  15.19- no hands    Time  4    Period  Weeks    Status  On-going    Target Date  01/21/18      PT LONG TERM GOAL #3   Title  improve functional endurance by performing 3MWT with at least 700'     Baseline  640 feet    Time  4    Period  Weeks    Status  On-going    Target Date  01/21/18      PT LONG TERM GOAL #4   Title  FOTO score improved to </= 40% limitation for improved function    Baseline  40% limitation    Status  Achieved      PT LONG TERM GOAL #5   Title  report pain < 4/10 with activity for improved mobility and function    Baseline  no knee pain reported    Status  Achieved      PT LONG TERM GOAL #6   Title  The patient will have the stamina  to walk in the grocery store 10-15 minutes.    Baseline  15-20 minutes     Status  Achieved            Plan - 01/05/18 1202    Clinical Impression Statement  Pt is making steady endurance and strength gains s/p significant complications with Rt TKA.   Pt tolerated addition of the leg press today and this will allow her to perform leg strength when she transitions to the gym.  Pt with continued wide base of support but this has improved significantly.  Pt demonstrates heel strike and reduced medial/lateral trunk motion. Pt is able to walk for 20 minutes in the grocery store.  Pt with improved overall bil knee and hip strength.  Pt will continue 2 addtional visits to improve strength and endurance.       Rehab Potential  Good    PT Frequency  2x / week    PT Duration  4 weeks    PT Treatment/Interventions  ADLs/Self Care Home Management;Cryotherapy;Moist Heat;Therapeutic exercise;Therapeutic activities;Functional mobility training;Stair training;Gait training;Balance training;Neuromuscular re-education;Patient/family education;Manual techniques;Vasopneumatic Device;Taping;Passive range of motion    PT Next Visit Plan  LE  strength, gait, balance and endurance- 2 more visits    PT Home Exercise Plan  Access Code: [email or web address]     Consulted and Agree with Plan of Care  Patient       Patient will benefit from skilled therapeutic intervention in order to improve the following deficits and impairments:  Abnormal gait, Pain, Decreased strength, Decreased activity tolerance, Decreased balance, Decreased mobility, Decreased range of motion  Visit Diagnosis: Acute pain of right knee  Stiffness of right knee, not elsewhere classified  Muscle weakness (generalized)  Other abnormalities of gait and mobility     Problem List Patient Active Problem List   Diagnosis Date Noted  . Wound, open with complication 68/04/7516  . Fever   . Anemia due to chemotherapy 05/07/2017  . Abnormal LFTs 05/07/2017  . History of breast cancer   .  Bilateral leg edema   . Enterococcal infection   . S/P revision of total knee, right 04/27/2017  . Pyogenic arthritis of right knee joint ([place])   . Acute blood loss anemia 12/12/2016  . Right knee abx spacer 12/02/2016  . Physical exam 07/31/2016  . Allergic reaction 07/24/2016  . Drug rash 07/24/2016  . S/P total knee arthroplasty, right 05/31/2016  . 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 Nonhealing surgical wound 05/28/2016  . Sepsis ([place]) 05/10/2016  . UTI (urinary tract infection) 05/10/2016  . Cellulitis of right knee 05/10/2016  . Blood clot in vein   . Wound dehiscence, surgical 04/12/2016  . 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 Vitamin D deficiency 01/25/2016  . 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 Primary osteoarthritis of right knee 02/01/2015  . Symptomatic anemia 02/01/2015  . Frequent UTI 01/12/2014  . Sinus tachycardia 11/03/2013  . SOB (shortness of breath) 11/03/2013  . Postoperative anemia due to acute blood loss 12/24/2012  .    09/23/2012  . Sinusitis 07/28/2012  . Neuropathy due to chemotherapeutic drug ([place]) 07/25/2011  .  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 Metastatic breast cancer ([place]) 11/12/2010  . Chronic anticoagulation 11/12/2010  . Anxiety, generalized 03/26/2008     Sigurd Sos, PT 01/05/18 12:35 PM  [facility] [address], [place], [place], [address] Phone: [phone]   Fax:  [phone]  Name: [name] MRN: [id number] Date of Birth: [birth year]

## 2018-01-08 ENCOUNTER — Encounter: Payer: Self-pay | Admitting: Physical Therapy

## 2018-01-08 ENCOUNTER — Ambulatory Visit: Payer: 59 | Admitting: Physical Therapy

## 2018-01-08 DIAGNOSIS — M6281 Muscle weakness (generalized): Secondary | ICD-10-CM

## 2018-01-08 DIAGNOSIS — M25661 Stiffness of right knee, not elsewhere classified: Secondary | ICD-10-CM | POA: Diagnosis not present

## 2018-01-08 DIAGNOSIS — M25561 Pain in right knee: Secondary | ICD-10-CM

## 2018-01-08 DIAGNOSIS — R600 Localized edema: Secondary | ICD-10-CM

## 2018-01-08 DIAGNOSIS — R2689 Other abnormalities of gait and mobility: Secondary | ICD-10-CM

## 2018-01-08 NOTE — Therapy (Signed)
[facility] [address], [address] [place], [place], [address] Phone: [phone]   Fax:  [phone]  Physical Therapy Treatment  Patient Details  Name: [name] MRN: [id number] Date of Birth: 11/29/1955 Referring Provider: Dr. Paralee Cancel   Encounter Date: 01/08/2018  PT End of Session - 01/08/18 0927    Visit Number  28    Number of Visits  36    Date for PT Re-Evaluation  01/21/18    Authorization Type  [facility] [employer] (prior auth required for secondary insurance); 24 visits total approved (9 used prior to 09/02/17).  12 additional approved    Authorization - Visit Number  35    Authorization - Number of Visits  36    PT Start Time  [id number]    PT Stop Time  1015    PT Time Calculation (min)  47 min    Activity Tolerance  Patient tolerated treatment well    Behavior During Therapy  WFL for tasks assessed/performed       Past Medical History:  Diagnosis Date  . Arthritis   . 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 Heart murmur   . 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 MVP (mitral valve prolapse)    mild mvp w/ mild regurg. per last echo 04-24-2017  . Neuropathy due to chemotherapeutic  drug ([place])   . Non-healing surgical wound    right knee post re-implantation total knee arthroplasty 04-2017 unable to completely close surgical incision  . PONV (postoperative nausea and vomiting)    ponv likes scopolamine patch  . Port-A-Cath in place    RIGHT CHEST   . Red blood cell antibody positive   . Skin changes related to chemotherapy    per patient she has scabbed over skin circumventing port to right upper chest ;  state it is due to chemptherapy   . 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 APPLICATION OF A-CELL OF BACK Right 07/31/2016   Procedure: CELLERATE COLLAGEN PLACEMENT;  Surgeon: Loel Lofty Dillingham, DO;  Location: [facility];  Service: Plastics;  Laterality: Right;  . APPLICATION OF WOUND VAC Right 06/29/2017   Procedure: APPLICATION OF WOUND VAC;  Surgeon: Wallace Going, DO;  Location: [facility];  Service: Plastics;  Laterality: Right;  . DEBRIDEMENT CHEST WALL RIGHT/ VAC PLACEMENT  02-09-2015    [facility]  . EXCISIONAL TOTAL KNEE ARTHROPLASTY WITH ANTIBIOTIC SPACERS Right 12/02/2016   Procedure: EXCISIONAL TOTAL KNEE ARTHROPLASTY WITH ANTIBIOTIC SPACERS;  Surgeon: Paralee Cancel, MD;  Location:  [facility];  Service: Orthopedics;  Laterality: Right;  90 mins  . HEMATOMA EVACUATION Right 06/29/2017   Procedure: EVACUATION HEMATOMA OF RIGHT KNEE;  Surgeon: Wallace Going, DO;  Location: [facility];  Service: Plastics;  Laterality: Right;  . HEMATOMA EVACUATION Right 06/29/2017   Procedure: EVACUATION RIGHT TOTAL KNEE HEMATOMA;  Surgeon: Paralee Cancel, MD;  [facility];  Service: Orthopedics;  Laterality: Right;  . HEMATOMA EVACUATION Right 07/14/2017   Procedure: EVACUATION RIGHT LEG HEMATOMA WITH APPLICATION OF WOUND VAC;  Surgeon: Paralee Cancel, MD;  [facility];  Service: Orthopedics;  Laterality: Right;  .  hemotoma     evacuation left chest wall  . I&D EXTREMITY Right 07/17/2017   Procedure: EVACUATION RIGHT LEG HEMATOMA WITH WOUND VAC DRESSING CHANGE;  Surgeon: Paralee Cancel, MD;  [facility];  Service: Orthopedics;  Laterality: Right;  . I&D KNEE WITH POLY EXCHANGE Right 05/28/2016   Procedure: IRRIGATION AND DEBRIDEMENT KNEE WOUND VAC PLACMENT;  Surgeon: Susa Day, MD;  [facility];  Service: Orthopedics;  Laterality: Right;  . I&D KNEE WITH POLY EXCHANGE Right 05/30/2016   Procedure: RADICAL SYNOVECTOMY,IRRIGATION AND DEBRIDEMENT KNEE WITH POLY EXCHANGE WITH ANTIBIOTIC BEADS, APPLICATION OF WOUND VAC;  Surgeon: Susa Day, MD;  [facility];  Service: Orthopedics;  Laterality: Right;  . INCISION AND DRAINAGE OF WOUND Right 07/31/2016   Procedure: IRRIGATION AND DEBRIDEMENT RIGHT KNEE  WOUND;  Surgeon: Loel Lofty Dillingham, DO;  Location: [facility];  Service: Plastics;  Laterality: Right;  . IR FLUORO GUIDE CV LINE LEFT  04/30/2017  . IR US GUIDE VASC ACCESS LEFT  04/30/2017  . IRRIGATION AND DEBRIDEMENT KNEE Right 04/12/2016   Procedure: IRRIGATION AND DEBRIDEMENT KNEE;  Surgeon: Nicholes Stairs, MD;  [facility];  Service: Orthopedics;  Laterality: Right;  . IRRIGATION AND DEBRIDEMENT KNEE Right 12/16/2016   Procedure: Repeat irrigation and debridement right knee, wound closure  wound vac REPLACEMENT ANTIBIOTIC SPACERS;  Surgeon: Paralee Cancel, MD;  [facility];  Service: Orthopedics;  Laterality: Right;  . KNEE ARTHROSCOPY  02/13/2012   Procedure: ARTHROSCOPY KNEE;  Surgeon: Johnn Hai, MD;  Location: [facility];  Service: Orthopedics;  Laterality: Left;  WITH DEBRIDEMENt   . KNEE ARTHROSCOPY WITH LATERAL MENISECTOMY  02/13/2012   Procedure: KNEE ARTHROSCOPY WITH LATERAL MENISECTOMY;  Surgeon: Johnn Hai, MD;  Location: [facility];  Service: Orthopedics;;  partial  . LATISSIMUS FLAP RIGHT CHEST  02-23-2015   [facility]  . LEFT MODIFIED  RADICAL MASTECTOMY/ RIGHT TOTAL MASTECTOMY  11-13-2007   LEFT BREAST CANCER W/ AXILLARY LYMPH NODE METASTASIS AND POST NEOADJUVANT CHEMO  . MASS EXCISION Right 06/29/2017   Procedure: EXCISION OF METASTATIC BREAST CANCER TO SKIN RIGHT SHOULDER, PLACEMENT OF CELLERATE;  Surgeon: Wallace Going, DO;  [facility];  Service: Plastics;  Laterality: Right;  . PLACEMENT PORT-A-CATH  06/22/2007    new port placed 2015, right chest  . REIMPLANTATION OF TOTAL KNEE Right 04/27/2017   Procedure: Reimplantation of right total knee arthroplasty;  Surgeon: Paralee Cancel, MD;  [facility];  Service: Orthopedics;  Laterality: Right;  Adductor Block  . RESECTION OF ISOLATED CHEST WALL DISEASE/ ABDOMINAL SKIN FLAP  02/ 2015     [facility]   RIGHT CHEST WALL METASTATIC SKIN CARCINOMA  . SKIN SPLIT GRAFT Right 01/29/2017   Procedure: SKIN GRAFT SPLIT THICKNESS TO RIGHT KNEE WOUND;  Surgeon: Wallace Going, DO;  Location[facility];  Service: Plastics;  Laterality: Right;  . SKIN SPLIT GRAFT Right 09/07/2017   Procedure: SKIN GRAFT SPLIT THICKNESS TO RIGHT KNEE WOUND WITH PLACEMENT OF VAC DRESSING TO GRAFT SITE;  Surgeon: Wallace Going, DO;  Location: [facility];  Service: Plastics;  Laterality: Right;  . TOTAL KNEE ARTHROPLASTY Left 09/23/2012   Procedure: LEFT TOTAL KNEE ARTHROPLASTY;  Surgeon: Johnn Hai, MD;  Location: [facility];  Service: Orthopedics;  Laterality: Left;  . TOTAL KNEE ARTHROPLASTY Right 03/20/2016   Procedure: RIGHT TOTAL KNEE ARTHROPLASTY;  Surgeon: Susa Day, MD;  [facility];  Service: Orthopedics;  Laterality: Right;  . TRANSTHORACIC ECHOCARDIOGRAM  04/24/2017   ef 59-56%, grade 1 diastolic dysfuction/  mild MR with mild prolapse anterior leaflet/ mild TR/ PASP 65mmHg    There were no vitals filed for this visit.  Subjective Assessment - 01/08/18 0929    Subjective  No new complaints today.    Currently in Pain?  No/denies   Just stiffness    Multiple Pain Sites  No                       [facility] Adult PT Treatment/Exercise - 01/08/18 0001      Knee/Hip Exercises: Aerobic   Nustep  L3 x 15 min   VC to keep SPM up > 100     Knee/Hip Exercises: Machines for Strengthening   Total Gym Leg Press  seat 7: Bil 65# 10x, 70# 10x, Unilaterall 30# 2x10 both LE      Knee/Hip Exercises: Standing   Knee Flexion  AAROM;Both;2 sets;15 reps    Knee Flexion Limitations  4#    Hip Abduction  Stengthening;Both;2 sets;15 reps;Knee straight    Abduction Limitations  4#    Hip Extension  Stengthening;Both;2 sets;15 reps;Knee straight    Extension Limitations  4#    Other Standing Knee Exercises  side stepping with blue band 20 ft 2x      Knee/Hip Exercises: Seated   Long Arc Quad  Strengthening;Both;3 sets;Weights    Long Arc Quad Weight  4 lbs.    Clamshell with TheraBand  Blue   25x   Marching  Strengthening;Both;2 sets;15 reps;Weights    Marching Limitations  4    Sit to Sand  2 sets;10 reps;without UE support   Ascent fast, descend slow VC              PT Short Term Goals - 11/04/17 0909      PT SHORT TERM GOAL #2   Title  Pt will be able to complete atleast 20 repetitions of SLR without extension lag, which will improve her safety with ambulation and other activity.     Time  3    Period  Weeks    Status  Achieved   Can do 2x10     PT SHORT TERM GOAL #3   Title  Pt will demo Lt knee AROM flexion to atlast 100 deg which will help with technique during sit to stand.     Time  3    Period  Weeks    Status  Achieved   113 degrees     PT SHORT TERM GOAL #5   Title  Pt will demo proper mechanics with sit to stand, without significant weight shift Lt and no more than 1 UE support for safety.     Time  3    Period  Weeks    Status  Achieved  PT Long Term Goals - 12/24/17 0939      PT LONG TERM GOAL #1   Title  independent with HEP    Time  4    Period  Weeks    Status  On-going    Target  Date  01/21/18      PT LONG TERM GOAL #2   Title  demonstrate improved functional strength by performing 5x STS without UE support in < 15 sec    Baseline  15.19- no hands    Time  4    Period  Weeks    Status  On-going    Target Date  01/21/18      PT LONG TERM GOAL #3   Title  improve functional endurance by performing 3MWT with at least 700'     Baseline  640 feet    Time  4    Period  Weeks    Status  On-going    Target Date  01/21/18      PT LONG TERM GOAL #4   Title  FOTO score improved to </= 40% limitation for improved function    Baseline  40% limitation    Status  Achieved      PT LONG TERM GOAL #5   Title  report pain < 4/10 with activity for improved mobility and function    Baseline  no knee pain reported    Status  Achieved      PT LONG TERM GOAL #6   Title  The patient will have the stamina  to walk in the grocery store 10-15 minutes.    Baseline  15-20 minutes     Status  Achieved            Plan - 01/08/18 0928    Clinical Impression Statement  Had pt perform unilateral work on the Nustep to specifically drive with her RT quads independently, giving her a new/different challenge. All other exercises pt is independent in. Pt will be ready for Dc next session.     Rehab Potential  Good    PT Frequency  2x / week    PT Duration  4 weeks    PT Treatment/Interventions  ADLs/Self Care Home Management;Cryotherapy;Moist Heat;Therapeutic exercise;Therapeutic activities;Functional mobility training;Stair training;Gait training;Balance training;Neuromuscular re-education;Patient/family education;Manual techniques;Vasopneumatic Device;Taping;Passive range of motion    PT Next Visit Plan  LE strength, gait, balance and endurance- discharge next visit    PT Home Exercise Plan  Access Code: [email or web address]     Consulted and Agree with Plan of Care  Patient       Patient will benefit from skilled therapeutic intervention in order to improve the following deficits and  impairments:  Abnormal gait, Pain, Decreased strength, Decreased activity tolerance, Decreased balance, Decreased mobility, Decreased range of motion  Visit Diagnosis: Acute pain of right knee  Stiffness of right knee, not elsewhere classified  Muscle weakness (generalized)  Other abnormalities of gait and mobility  Localized edema     Problem List Patient Active Problem List   Diagnosis Date Noted  . Wound, open with complication 09/32/3557  . Fever   . Anemia due to chemotherapy 05/07/2017  . Abnormal LFTs 05/07/2017  . History of breast cancer   . Bilateral leg edema   . Enterococcal infection   . S/P revision of total knee, right 04/27/2017  . Pyogenic arthritis of right knee joint ([place])   . Acute blood loss anemia 12/12/2016  . Right knee abx spacer 12/02/2016  . Physical  exam 07/31/2016  . Allergic reaction 07/24/2016  . Drug rash 07/24/2016  . S/P total knee arthroplasty, right 05/31/2016  . 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 Nonhealing surgical wound 05/28/2016  . Sepsis ([place]) 05/10/2016  . UTI (urinary tract infection) 05/10/2016  . Cellulitis of right knee 05/10/2016  . Blood clot in vein   . Wound dehiscence, surgical 04/12/2016  . 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 Vitamin D deficiency 01/25/2016  . 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 Primary osteoarthritis of right knee 02/01/2015  . Symptomatic anemia 02/01/2015  . Frequent UTI 01/12/2014  . Sinus tachycardia 11/03/2013  . SOB (shortness of breath) 11/03/2013  . Postoperative anemia due to acute blood loss 12/24/2012  .    09/23/2012  . Sinusitis 07/28/2012  . Neuropathy due to chemotherapeutic drug ([place]) 07/25/2011  . 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 Metastatic breast cancer ([place]) 11/12/2010  . Chronic anticoagulation 11/12/2010  . Anxiety, generalized 03/26/2008    Saim Almanza, PTA 01/08/2018, 10:07 AM  [facility] [address] [place], [place], [address] Phone: [phone]   Fax:  [phone]  Name: [name] MRN: [id number] Date of Birth: Aug 24, 1955

## 2018-01-11 ENCOUNTER — Encounter: Payer: Self-pay | Admitting: Family Medicine

## 2018-01-11 ENCOUNTER — Ambulatory Visit: Payer: 59

## 2018-01-11 DIAGNOSIS — R2689 Other abnormalities of gait and mobility: Secondary | ICD-10-CM | POA: Diagnosis not present

## 2018-01-11 DIAGNOSIS — M6281 Muscle weakness (generalized): Secondary | ICD-10-CM

## 2018-01-11 DIAGNOSIS — R600 Localized edema: Secondary | ICD-10-CM | POA: Diagnosis not present

## 2018-01-11 DIAGNOSIS — M25561 Pain in right knee: Secondary | ICD-10-CM

## 2018-01-11 DIAGNOSIS — M25661 Stiffness of right knee, not elsewhere classified: Secondary | ICD-10-CM

## 2018-01-11 NOTE — Therapy (Signed)
[facility] [address] [place], [place], [address] Phone: [phone]   Fax:  [phone]  Physical Therapy Treatment  Patient Details  Name: [name] MRN: [id number] Date of Birth: [birth year] Referring Provider: Dr. Paralee Cancel   Encounter Date: 01/11/2018  PT End of Session - 01/11/18 1017    Visit Number  29    Date for PT Re-Evaluation  01/21/18    Authorization Type  [facility] UMR/UHC (prior auth required for secondary insurance); 24 visits total approved (9 used prior to 09/02/17).  12 additional approved    Authorization - Visit Number  37    Authorization - Number of Visits  36    PT Start Time  [id number]    PT Stop Time  1016    PT Time Calculation (min)  51 min    Activity Tolerance  Patient tolerated treatment well    Behavior During Therapy  WFL for tasks assessed/performed       Past Medical History:  Diagnosis Date  . Arthritis   . 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 Heart murmur   . 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 MVP (mitral valve prolapse)    mild mvp w/ mild regurg. per last echo 04-24-2017  . Neuropathy due to chemotherapeutic drug ([place])   .  Non-healing surgical wound    right knee post re-implantation total knee arthroplasty 04-2017 unable to completely close surgical incision  . PONV (postoperative nausea and vomiting)    ponv likes scopolamine patch  . Port-A-Cath in place    RIGHT CHEST   . Red blood cell antibody positive   . Skin changes related to chemotherapy    per patient she has scabbed over skin circumventing port to right upper chest ;  state it is due to chemptherapy   . 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 APPLICATION OF A-CELL OF BACK Right 07/31/2016   Procedure: CELLERATE COLLAGEN PLACEMENT;  Surgeon: Loel Lofty Dillingham, DO;  Location: [place];  Service: Plastics;  Laterality: Right;  . APPLICATION OF WOUND VAC Right 06/29/2017   Procedure: APPLICATION OF WOUND VAC;  Surgeon: Wallace Going, DO;  [facility];  Service: Plastics;  Laterality: Right;  . DEBRIDEMENT CHEST WALL RIGHT/ VAC PLACEMENT  02-09-2015    [facility]  . EXCISIONAL TOTAL KNEE ARTHROPLASTY WITH ANTIBIOTIC SPACERS Right 12/02/2016   Procedure: EXCISIONAL TOTAL KNEE ARTHROPLASTY WITH ANTIBIOTIC SPACERS;  Surgeon: Paralee Cancel, MD;  [facility];  Service: Orthopedics;  Laterality: Right;  90 mins  . HEMATOMA EVACUATION Right 06/29/2017   Procedure: EVACUATION HEMATOMA OF RIGHT KNEE;  Surgeon: Dillingham, Claire S, DO;  [facility];  Service: Plastics;  Laterality: Right;  . HEMATOMA EVACUATION Right 06/29/2017   Procedure: EVACUATION RIGHT TOTAL KNEE HEMATOMA;  Surgeon: Olin, Matthew, MD;  [facility];  Service: Orthopedics;  Laterality: Right;  . HEMATOMA EVACUATION Right 07/14/2017   Procedure: EVACUATION RIGHT LEG HEMATOMA WITH APPLICATION OF WOUND VAC;  Surgeon: Olin, Matthew, MD;  [facility];  Service: Orthopedics;  Laterality: Right;  . hemotoma      evacuation left chest wall  . I&D EXTREMITY Right 07/17/2017   Procedure: EVACUATION RIGHT LEG HEMATOMA WITH WOUND VAC DRESSING CHANGE;  Surgeon: Olin, Matthew, MD;  [facility];  Service: Orthopedics;  Laterality: Right;  . I&D KNEE WITH POLY EXCHANGE Right 05/28/2016   Procedure: IRRIGATION AND DEBRIDEMENT KNEE WOUND VAC PLACMENT;  Surgeon: Jeffrey Beane, MD;  [facility];  Service: Orthopedics;  Laterality: Right;  . I&D KNEE WITH POLY EXCHANGE Right 05/30/2016   Procedure: RADICAL SYNOVECTOMY,IRRIGATION AND DEBRIDEMENT KNEE WITH POLY EXCHANGE WITH ANTIBIOTIC BEADS, APPLICATION OF WOUND VAC;  Surgeon: Jeffrey Beane, MD;  [facility];  Service: Orthopedics;  Laterality: Right;  . INCISION AND DRAINAGE OF WOUND Right 07/31/2016   Procedure: IRRIGATION AND DEBRIDEMENT RIGHT KNEE  WOUND;  Surgeon: Claire S Dillingham, DO;  [facility];  Service: Plastics;  Laterality: Right;  . IR FLUORO GUIDE CV LINE LEFT  04/30/2017  . IR US GUIDE VASC ACCESS LEFT  04/30/2017  . IRRIGATION AND DEBRIDEMENT KNEE Right 04/12/2016   Procedure: IRRIGATION AND DEBRIDEMENT KNEE;  Surgeon: Jason Patrick Rogers, MD;  [facility];  Service: Orthopedics;  Laterality: Right;  . IRRIGATION AND DEBRIDEMENT KNEE Right 12/16/2016   Procedure: Repeat irrigation and debridement right knee, wound closure  wound vac REPLACEMENT ANTIBIOTIC SPACERS;  Surgeon: Olin, Matthew, MD;  [facility];  Service: Orthopedics;  Laterality: Right;  . KNEE ARTHROSCOPY  02/13/2012   Procedure: ARTHROSCOPY KNEE;  Surgeon: Jeffrey C Beane, MD;  Location: [facility];  Service: Orthopedics;  Laterality: Left;  WITH DEBRIDEMENt   . KNEE ARTHROSCOPY WITH LATERAL MENISECTOMY  02/13/2012   Procedure: KNEE ARTHROSCOPY WITH LATERAL MENISECTOMY;  Surgeon: Jeffrey C Beane, MD;  Location: [facility];  Service: Orthopedics;;  partial  . LATISSIMUS FLAP RIGHT CHEST  02-23-2015   [facility]  . LEFT MODIFIED RADICAL  MASTECTOMY/ RIGHT TOTAL MASTECTOMY  11-13-2007   LEFT BREAST CANCER W/ AXILLARY LYMPH NODE METASTASIS AND POST NEOADJUVANT CHEMO  . MASS EXCISION Right 06/29/2017   Procedure: EXCISION OF METASTATIC BREAST CANCER TO SKIN RIGHT SHOULDER, PLACEMENT OF CELLERATE;  Surgeon: Dillingham, Claire S, DO;  [facility];  Service: Plastics;  Laterality: Right;  . PLACEMENT PORT-A-CATH  06/22/2007    new port placed 2015, right chest  . REIMPLANTATION OF TOTAL KNEE Right 04/27/2017   Procedure: Reimplantation of right total knee arthroplasty;  Surgeon: Olin, Matthew, MD;  [facility];  Service: Orthopedics;  Laterality: Right;  Adductor Block  . RESECTION OF ISOLATED CHEST WALL DISEASE/ ABDOMINAL SKIN FLAP  02/ 2015     [facility]   RIGHT CHEST WALL METASTATIC SKIN CARCINOMA  . SKIN SPLIT GRAFT Right 01/29/2017   Procedure: SKIN GRAFT SPLIT THICKNESS TO RIGHT KNEE WOUND;  Surgeon: Dillingham, Claire S, DO;  [facility];  Service: Plastics;  Laterality: Right;  . SKIN SPLIT GRAFT Right 09/07/2017     Procedure: SKIN GRAFT SPLIT THICKNESS TO RIGHT KNEE WOUND WITH PLACEMENT OF VAC DRESSING TO GRAFT SITE;  Surgeon: Wallace Going, DO;  Location: [place];  Service: Plastics;  Laterality: Right;  . TOTAL KNEE ARTHROPLASTY Left 09/23/2012   Procedure: LEFT TOTAL KNEE ARTHROPLASTY;  Surgeon: Johnn Hai, MD;  [facility];  Service: Orthopedics;  Laterality: Left;  . TOTAL KNEE ARTHROPLASTY Right 03/20/2016   Procedure: RIGHT TOTAL KNEE ARTHROPLASTY;  Surgeon: Susa Day, MD;  [facility];  Service: Orthopedics;  Laterality: Right;  . TRANSTHORACIC ECHOCARDIOGRAM  04/24/2017   ef 58-52%, grade 1 diastolic dysfuction/  mild MR with mild prolapse anterior leaflet/ mild TR/ PASP 44mHg    There were no vitals filed for this visit.  Subjective Assessment - 01/11/18 0929    Subjective  I'm ready to D/C to HEP    Currently in Pain?  No/denies         OPride MedicalPT Assessment -  01/11/18 0001      Assessment   Medical Diagnosis  Rt TKA reconstruction s/p I&D with wound vac placement x 2    Onset Date/Surgical Date  04/27/17      Observation/Other Assessments   Focus on Therapeutic Outcomes (FOTO)   28% limitation      Transfers   Transfers  Stand to Sit;Sit to Stand    Sit to Stand  7: Independent;Without upper extremity assist    Five time sit to stand comments   12.29    Stand to Sit  Without upper extremity assist      Ambulation/Gait   Pre-Gait Activities  3 minute walk test 640 feet    Gait Comments  normal base of support with symmetry with ambulation.                   [place]Adult PT Treatment/Exercise - 01/11/18 0001      Knee/Hip Exercises: Aerobic   Nustep  L3 x 10 min   VC to keep SPM up > 100     Knee/Hip Exercises: Standing   Knee Flexion  AAROM;Both;2 sets;15 reps    Knee Flexion Limitations  4#    Hip Abduction  Stengthening;Both;2 sets;15 reps;Knee straight    Abduction Limitations  4#    Hip Extension  Stengthening;Both;2 sets;15 reps;Knee straight    Extension Limitations  4#      Knee/Hip Exercises: Seated   Long Arc Quad  Strengthening;Both;3 sets;Weights    Long Arc Quad Weight  4 lbs.    Clamshell with TheraBand  Blue   25x   Marching  Strengthening;Both;2 sets;15 reps;Weights    Marching Limitations  4    Sit to Sand  2 sets;10 reps;without UE support   Ascent fast, descend slow VC              PT Short Term Goals - 11/04/17 0909      PT SHORT TERM GOAL #2   Title  Pt will be able to complete atleast 20 repetitions of SLR without extension lag, which will improve her safety with ambulation and other activity.     Time  3    Period  Weeks    Status  Achieved   Can do 2x10     PT SHORT TERM GOAL #3   Title  Pt will demo Lt knee AROM flexion to atlast 100 deg which will help with technique during sit to stand.     Time  3  Period  Weeks    Status  Achieved   113 degrees     PT SHORT TERM  GOAL #5   Title  Pt will demo proper mechanics with sit to stand, without significant weight shift Lt and no more than 1 UE support for safety.     Time  3    Period  Weeks    Status  Achieved        PT Long Term Goals - 01/11/18 0939      PT LONG TERM GOAL #1   Title  independent with HEP    Status  Achieved      PT LONG TERM GOAL #2   Title  demonstrate improved functional strength by performing 5x STS without UE support in < 15 sec    Baseline  12.98- no hands      PT LONG TERM GOAL #3   Title  improve functional endurance by performing 3MWT with at least 700'     Baseline  640 feet    Status  Partially Met      PT LONG TERM GOAL #4   Title  FOTO score improved to </= 40% limitation for improved function    Baseline  28% limitation    Status  Achieved      PT LONG TERM GOAL #5   Title  report pain < 4/10 with activity for improved mobility and function    Status  Achieved      PT LONG TERM GOAL #6   Title  The patient will have the stamina  to walk in the grocery store 10-15 minutes.    Status  Achieved            Plan - 01/11/18 1018    Clinical Impression Statement  Pt has made excellent progress with PT. Pt with improved endurance for standing and walking with ability to walk longer distances in the community.  Pt with minimal gait antalgia and has normalized base of support.  5x sit to stand is performed in 12.98 seconds without upper extremity support and this indicates improved balance.  FOTO is improved to 28% limitation.  Pt will D/C to HEP and gym exercises.      PT Next Visit Plan  D/C PT to HEP    PT Home Exercise Plan  Access Code: [email or web address]     Consulted and Agree with Plan of Care  Patient       Patient will benefit from skilled therapeutic intervention in order to improve the following deficits and impairments:     Visit Diagnosis: Acute pain of right knee  Stiffness of right knee, not elsewhere classified  Muscle weakness  (generalized)  Other abnormalities of gait and mobility     Problem List Patient Active Problem List   Diagnosis Date Noted  . Wound, open with complication 07/14/2017  . Fever   . Anemia due to chemotherapy 05/07/2017  . Abnormal LFTs 05/07/2017  . History of breast cancer   . Bilateral leg edema   . Enterococcal infection   . S/P revision of total knee, right 04/27/2017  . Pyogenic arthritis of right knee joint (HCC)   . Acute blood loss anemia 12/12/2016  . Right knee abx spacer 12/02/2016  . Physical exam 07/31/2016  . Allergic reaction 07/24/2016  . Drug rash 07/24/2016  . S/P total knee arthroplasty, right 05/31/2016  . 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 Nonhealing surgical wound 05/28/2016  . Sepsis (HCC) 05/10/2016  .   UTI (urinary tract infection) 05/10/2016  . Cellulitis of right knee 05/10/2016  . Blood clot in vein   . Wound dehiscence, surgical 04/12/2016  . 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 Vitamin D deficiency 01/25/2016  . 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 Primary osteoarthritis of right knee 02/01/2015  . Symptomatic anemia 02/01/2015  . Frequent UTI 01/12/2014  . Sinus tachycardia 11/03/2013  . SOB (shortness of breath) 11/03/2013  . Postoperative anemia due to acute blood loss 12/24/2012  .    09/23/2012  . Sinusitis 07/28/2012  . Neuropathy due to chemotherapeutic drug ([place]) 07/25/2011  . 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 Metastatic breast cancer ([place]) 11/12/2010  . Chronic anticoagulation 11/12/2010  . Anxiety, generalized 03/26/2008   PHYSICAL THERAPY DISCHARGE SUMMARY  Visits from Start of Care: 29  Current functional level related to goals / functional outcomes: See above for current status.     Remaining deficits: Endurance deficits of a chronic nature. Pt has HEP in place and plans to joint [employer].   Education / Equipment: HEP, gym exercises Plan: Patient agrees to discharge.  Patient goals  were met. Patient is being discharged due to meeting the stated rehab goals.  ?????         Sigurd Sos, PT 01/11/18 10:20 AM  [facility] [address], [place], [place], [address] Phone: [phone]   Fax:  [phone]  Name: [name] MRN: [id number] Date of Birth: [birth year]

## 2018-01-12 DIAGNOSIS — C50912 Malignant neoplasm of unspecified site of left female breast: Secondary | ICD-10-CM | POA: Diagnosis not present

## 2018-01-12 DIAGNOSIS — I340 Nonrheumatic mitral (valve) insufficiency: Secondary | ICD-10-CM | POA: Diagnosis not present

## 2018-01-12 DIAGNOSIS — C792 Secondary malignant neoplasm of skin: Secondary | ICD-10-CM | POA: Diagnosis not present

## 2018-01-12 DIAGNOSIS — L989 Disorder of the skin and subcutaneous tissue, unspecified: Secondary | ICD-10-CM | POA: Diagnosis not present

## 2018-01-12 DIAGNOSIS — G629 Polyneuropathy, unspecified: Secondary | ICD-10-CM | POA: Diagnosis not present

## 2018-01-12 DIAGNOSIS — I272 Pulmonary hypertension, unspecified: Secondary | ICD-10-CM | POA: Diagnosis not present

## 2018-01-12 DIAGNOSIS — Z5112 Encounter for antineoplastic immunotherapy: Secondary | ICD-10-CM | POA: Diagnosis not present

## 2018-01-12 DIAGNOSIS — C50412 Malignant neoplasm of upper-outer quadrant of left female breast: Secondary | ICD-10-CM | POA: Diagnosis not present

## 2018-01-12 DIAGNOSIS — R490 Dysphonia: Secondary | ICD-10-CM | POA: Diagnosis not present

## 2018-01-12 DIAGNOSIS — R748 Abnormal levels of other serum enzymes: Secondary | ICD-10-CM | POA: Diagnosis not present

## 2018-01-12 MED ORDER — GABAPENTIN 300 MG PO CAPS: 300.0000 mg | ORAL_CAPSULE | Freq: Three times a day (TID) | ORAL | 6 refills | Status: DC

## 2018-01-12 MED FILL — GABAPENTIN 300 MG CAPSULE: 300 | 30 days supply | Qty: 90 | Fill #0

## 2018-01-15 ENCOUNTER — Ambulatory Visit (INDEPENDENT_AMBULATORY_CARE_PROVIDER_SITE_OTHER): Payer: 59 | Admitting: Sports Medicine

## 2018-01-15 ENCOUNTER — Encounter: Payer: Self-pay | Admitting: Sports Medicine

## 2018-01-15 VITALS — BP 112/80 | HR 91 | Ht 67.0 in | Wt 169.2 lb

## 2018-01-15 DIAGNOSIS — G8929 Other chronic pain: Secondary | ICD-10-CM | POA: Insufficient documentation

## 2018-01-15 DIAGNOSIS — M25512 Pain in left shoulder: Secondary | ICD-10-CM | POA: Diagnosis not present

## 2018-01-15 DIAGNOSIS — C50919 Malignant neoplasm of unspecified site of unspecified female breast: Secondary | ICD-10-CM

## 2018-01-15 DIAGNOSIS — M9908 Segmental and somatic dysfunction of rib cage: Secondary | ICD-10-CM | POA: Diagnosis not present

## 2018-01-15 DIAGNOSIS — M7502 Adhesive capsulitis of left shoulder: Secondary | ICD-10-CM | POA: Diagnosis not present

## 2018-01-15 NOTE — Assessment & Plan Note (Signed)
Overall good improvement with her range of motion.  We will plan to have her follow-up as needed for repeat injections and/or further advanced imaging as needed.  Home therapeutic exercises today.

## 2018-01-15 NOTE — Patient Instructions (Addendum)
Please perform the exercise program that we have prepared for you and gone over in detail on a daily basis.  In addition to the handout you were provided you can access your program through: [email or web address]   Your unique program code is:  [email or web address]

## 2018-01-15 NOTE — Progress Notes (Signed)
PROCEDURE NOTE : OSTEOPATHIC MANIPULATION The decision today to treat with Osteopathic Manipulative Therapy (OMT) was based on physical exam findings. Verbal consent was obtained following a discussion with the patient regarding the of risks, benefits and potential side effects, including an acute pain flare,post manipulation soreness and need for repeat treatments.     Contraindications to OMT: NONE  Manipulation was performed as below: Regions treated: Ribs OMT Techniques Used: HVLA  The patient tolerated the treatment well and reported Improved symptoms following treatment today. Patient was given medications, exercises, stretches and lifestyle modifications per AVS and verbally.   OSTEOPATHIC/STRUCTURAL EXAM:   Rib 4 Left  Posterior

## 2018-01-15 NOTE — Progress Notes (Signed)
PROCEDURE NOTE: THERAPEUTIC EXERCISES (97110) 15 minutes spent for Therapeutic exercises as below and as referenced in the AVS.  This included exercises focusing on stretching, strengthening, with significant focus on eccentric aspects.   Proper technique shown and discussed handout in great detail with ATC.  All questions were discussed and answered.   Long term goals include an improvement in range of motion, strength, endurance as well as avoiding reinjury. Frequency of visits is one time as determined during today's  office visit. Frequency of exercises to be performed is as per handout.  EXERCISES REVIEWED:  Scapular Stabilization  Advanced Codman exercises

## 2018-01-15 NOTE — Progress Notes (Signed)
[name]. [place], [name] - 62 y.o. female MRN [id number]  Date of birth: 03/11/1956  Visit Date: 01/15/2018  PCP: Midge Minium, MD   Referred by: Midge Minium, MD  Scribe(s) for today's visit: Josepha Pigg, CMA  SUBJECTIVE:  [name] is here for Follow-up (L shoulder pain)   12/15/2017: Her L shoulder pain symptoms INITIALLY: Began about 3 months ago w/ no known MOI.  She reports that her L shoulder will get "stuck" at times with horiz aDd but also reports being able to "unstick" her L shoulder by moving into L shoulder extension and horiz aBd.  She also notes having spasms in her L upper arm. Described as mild brief, sharp pain, nonradiating Worsened with horiz aDd Improved with getting her arm out of the irritating position Additional associated symptoms include: residual N/T in L UE after L mastectomy in 2009; no mechanical symptoms noted   At this time symptoms are worsening compared to onset  She has not been doing anything in particular for her L shoulder.  Her PT that she is seeing for her R knee recommended that she get her L shoulder looked at.  01/15/2018: Compared to the last office visit, her previously described symptoms are improving. She does report occasional stiffness and spasm. She report increased ROM. She does feel like shoulder gets stuck at times but she is able to work it out.  Current symptoms are mild (2/10) & are radiating to the L arm but not beyond the elbow.  She has been taking Tylenol with some relief. She has been doing HEP with no trouble.    REVIEW OF SYSTEMS: Denies night time disturbances. Denies fevers, chills, or night sweats. Denies unexplained weight loss. Reports personal history of cancer. Denies changes in bowel or bladder habits. Denies recent unreported falls. Denies new or worsening dyspnea or wheezing. Denies headaches or  dizziness.  Reports numbness, tingling or weakness  In the extremities.  Denies dizziness or presyncopal episodes Denies lower extremity edema     HISTORY & PERTINENT PRIOR DATA:  Significant/pertinent history, findings, studies include:  reports that she has never smoked. She has never used smokeless tobacco. No results for input(s): HGBA1C, LABURIC, CREATINE in the last 8760 hours. No specialty comments available. No problems updated.  Otherwise prior history reviewed and updated per electronic medical record.    OBJECTIVE:  VS:  HT:5\' 7"  (170.2 cm)   WT:169 lb 3.2 oz (76.7 kg)  BMI:26.49    BP:112/80  HR:91bpm  TEMP: ( )  RESP:95 %   PHYSICAL EXAM: CONSTITUTIONAL: Well-developed, Well-nourished and In no acute distress Alert & appropriately interactive. and Not depressed or anxious appearing. RESPIRATORY: No increased work of breathing and Trachea Midline EYES: Pupils are equal., EOM intact without nystagmus. and No scleral icterus.  Upper extremities: Warm and well perfused NEURO: unremarkable Normal associated myotomal distribution strength to manual muscle testing Normal sensation to light touch  MSK Exam: Left Shoulder Exam: Well aligned, no significant deformity. No overlying skin changes. Neck: normal range of motion and supple Non tender to palpation over: Bony Landmarks TTP over: trapezius muscle Axial loading produces: No pain and Moderate crepitation Drop arm test: negative  Internal Rotation:  ROM: L2 with mild pain.   Strength: Normal External Rotation:  ROM: Normal with mild pain.   Strength: 4/5  positive, mild pain positive, moderate pain  PROCEDURES & DATA REVIEWED:  . Osteopathic manipulation was performed today based on physical exam findings.  Please see procedure note for further information including Osteopathic Exam findings . Discussed the foundation of treatment for this condition is physical therapy and/or daily (5-6  days/week) therapeutic exercises, focusing on core strengthening, coordination, neuromuscular control/reeducation.  Therapeutic exercises prescribed per procedure note.  ASSESSMENT   1. Adhesive capsulitis of left shoulder     PLAN:       . She is progressively been able to regain range of motion and strength.  She is still reporting numbness which is mild and radiating to the elbow. . Please see procedure section and notes. . If any lack of improvement consider further diagnostic evaluation with Repeat MSK ultrasound and/or MRI of the shoulder. No problem-specific Assessment & Plan notes found for this encounter.   Follow-up: Return if symptoms worsen or fail to improve.      Please see additional documentation for Objective, Assessment and Plan sections. Pertinent additional documentation may be included in corresponding procedure notes, imaging studies, problem based documentation and patient instructions. Please see these sections of the encounter for additional information regarding this visit.  CMA/ATC served as Education administrator during this visit. History, Physical, and Plan performed by medical provider. Documentation and orders reviewed and attested to.      Gerda Diss, [place] Sports Medicine Physician

## 2018-01-21 ENCOUNTER — Ambulatory Visit (HOSPITAL_COMMUNITY)
Admission: RE | Admit: 2018-01-21 | Discharge: 2018-01-21 | Disposition: A | Discharge status: Home / Self Care | Payer: 59 | Source: Ambulatory Visit | Attending: Internal Medicine | Admitting: Internal Medicine

## 2018-01-21 ENCOUNTER — Encounter (HOSPITAL_COMMUNITY): Payer: Self-pay | Admitting: *Deleted

## 2018-01-21 ENCOUNTER — Other Ambulatory Visit (HOSPITAL_COMMUNITY): Payer: Self-pay | Admitting: *Deleted

## 2018-01-21 ENCOUNTER — Ambulatory Visit (HOSPITAL_BASED_OUTPATIENT_CLINIC_OR_DEPARTMENT_OTHER)
Admission: RE | Admit: 2018-01-21 | Discharge: 2018-01-21 | Disposition: A | Discharge status: Home / Self Care | Payer: 59 | Source: Ambulatory Visit | Attending: Internal Medicine | Admitting: Internal Medicine

## 2018-01-21 ENCOUNTER — Telehealth: Payer: Self-pay | Admitting: Sports Medicine

## 2018-01-21 ENCOUNTER — Telehealth (HOSPITAL_COMMUNITY): Payer: Self-pay | Admitting: *Deleted

## 2018-01-21 VITALS — BP 140/82 | HR 92 | Wt 167.4 lb

## 2018-01-21 DIAGNOSIS — Z7901 Long term (current) use of anticoagulants: Secondary | ICD-10-CM | POA: Diagnosis not present

## 2018-01-21 DIAGNOSIS — Z9013 Acquired absence of bilateral breasts and nipples: Secondary | ICD-10-CM | POA: Insufficient documentation

## 2018-01-21 DIAGNOSIS — Z8249 Family history of ischemic heart disease and other diseases of the circulatory system: Secondary | ICD-10-CM | POA: Insufficient documentation

## 2018-01-21 DIAGNOSIS — Z79899 Other long term (current) drug therapy: Secondary | ICD-10-CM | POA: Diagnosis not present

## 2018-01-21 DIAGNOSIS — Z923 Personal history of irradiation: Secondary | ICD-10-CM | POA: Insufficient documentation

## 2018-01-21 DIAGNOSIS — Z803 Family history of malignant neoplasm of breast: Secondary | ICD-10-CM | POA: Diagnosis not present

## 2018-01-21 DIAGNOSIS — I272 Pulmonary hypertension, unspecified: Secondary | ICD-10-CM | POA: Diagnosis not present

## 2018-01-21 DIAGNOSIS — C792 Secondary malignant neoplasm of skin: Secondary | ICD-10-CM | POA: Insufficient documentation

## 2018-01-21 DIAGNOSIS — Z9221 Personal history of antineoplastic chemotherapy: Secondary | ICD-10-CM | POA: Insufficient documentation

## 2018-01-21 DIAGNOSIS — C50919 Malignant neoplasm of unspecified site of unspecified female breast: Secondary | ICD-10-CM

## 2018-01-21 DIAGNOSIS — Z853 Personal history of malignant neoplasm of breast: Secondary | ICD-10-CM | POA: Insufficient documentation

## 2018-01-21 DIAGNOSIS — Z820 Family history of epilepsy and other diseases of the nervous system: Secondary | ICD-10-CM | POA: Diagnosis not present

## 2018-01-21 DIAGNOSIS — Z86718 Personal history of other venous thrombosis and embolism: Secondary | ICD-10-CM | POA: Diagnosis not present

## 2018-01-21 DIAGNOSIS — Z880 Allergy status to penicillin: Secondary | ICD-10-CM | POA: Insufficient documentation

## 2018-01-21 LAB — BASIC METABOLIC PANEL
Anion gap: 9 (ref 5–15)
BUN: 21 mg/dL (ref 8–23)
CO2: 24 mmol/L (ref 22–32)
CREATININE: 0.77 mg/dL (ref 0.44–1.00)
Calcium: 9.3 mg/dL (ref 8.9–10.3)
Chloride: 105 mmol/L (ref 98–111)
GFR calc Af Amer: >60 mL/min (ref >60)
GFR calc non Af Amer: >60 mL/min (ref >60)
GLUCOSE: 97 mg/dL (ref 70–99)
Potassium: 4.3 mmol/L (ref 3.5–5.1)
Sodium: 138 mmol/L (ref 135–145)

## 2018-01-21 LAB — CBC
HCT: 44.2 % (ref 36.0–46.0)
Hemoglobin: 14.2 g/dL (ref 12.0–15.0)
MCH: 31.8 pg (ref 26.0–34.0)
MCHC: 32.1 g/dL (ref 30.0–36.0)
MCV: 99.1 fL (ref 78.0–100.0)
PLATELETS: 150 10*3/uL (ref 150–400)
RBC: 4.46 MIL/uL (ref 3.87–5.11)
RDW: 13.7 % (ref 11.5–15.5)
WBC: 5.3 10*3/uL (ref 4.0–10.5)

## 2018-01-21 LAB — PROTIME-INR
INR: 1.79
Prothrombin Time: 20.6 seconds — ABNORMAL HIGH (ref 11.4–15.2)

## 2018-01-21 NOTE — Progress Notes (Signed)
Patient ID: [name], female   DOB: Jun 29, 1955, 62 y.o.   MRN: [id number]     Cardio-Oncology Clinic Note  PCP: Dr. 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   She is s/p bilateral mastectomies, XRT, chemo. She is currently on Kadcyla (trastuzamab-emantsine) every 3 weeks. Underwent skin flap for skim metastasis on her abdomen in 2/15  Had skin resection with chest skin flap on 02/21/15 at [facility]. Requiring wound vac. Path ok fortunately with no recurrent malignancy.  Had clots around port-a-cath so on Elqiuis.   I saw her last in May. Has had a very difficult year. Had R TKA which got infected and then had to have hardware removed and got a spacer. Hardware eventually replaced. Then developed a hematoma and wound. Chemo had to be stopped for 2 months and required a skin graft. Unfortunately when chemo stopped developed recurrent skin mets which she is now struggling with. Remains on lapatanib (taking every other day due to diarrhea) and herceptin. Had CT chest in 12/18 due to SOB. No PE but showed evidence of possible PAH. VQ ordered 5/19. No PE.  Was having left shoulder pain but bone scan negative. Was seen in Sports Medicine and got injected  Overall getting better. Knee pain getting better. Riding her stationary bike 77mns/day. No CP. No SOB or dizziness.   Echo today EF 60-65% Grade I DD RV mildly dilated with normal function Mod TR. Mild MR RVSP 73 IVC ok Personally reviewed   Echo 10/12/17 EF 60-65% GLS -17.5% RV mildly dilated with normal function RVSP 65 IVC ok Personally reviewed   ECHO 09/27/2013 EF 60-65% Lateral S'10.7 Grade 2 DD ECHO 02/27/2014 EF 60-65% Lateral S' 10.4  ECHO 07/14/2014  EF 60-65% Lateral S' 10.4 GLS -23.1% mild MR ECHO 04/18/2015  EF 60-65% Lateral S' 13.6 GLS -19.2% mild TR Trivial MR. RV ok ECHO 03/03/2016 EF 60-65% lateral S 11.8 GLS -19.2 poor window   Past Medical  History:  Diagnosis Date  . Arthritis   . Breast cancer metastasized to skin, right ([facility] followed by dr force -- oncolgoist w/ DGriff[facility]  primary left breast cancer dx 01/ 2009 ; 11/ 2009 recurrence left chest wall and neck, tx clinical trial drug and chemotherapy;  2015 recurrence cutaneous metastatized to right skin/ chest wall, 02/ 2015 resection chest wall disease and 02-23-2015 s/p skin flap surgery,  chemo every 3 weeks  . 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 Heart murmur   . 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 MVP (mitral valve prolapse)    mild mvp w/ mild regurg. per last echo 04-24-2017  . Neuropathy due to chemotherapeutic drug ([place] [place]   . Non-healing surgical wound    right knee post re-implantation total knee arthroplasty 04-2017 unable to completely close surgical incision  . PONV (postoperative nausea and vomiting)    ponv likes scopolamine patch  . Port-A-Cath in place    RIGHT CHEST   . Red blood cell antibody positive   . Skin changes related to chemotherapy  per patient she has scabbed over skin circumventing port to right upper chest ;  state it is due to chemptherapy   . 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 acetaminophen (TYLENOL) 500 MG tablet Take 500 mg by mouth every 8 (eight) hours as needed for mild pain or headache.    [name] apixaban (ELIQUIS) 5 MG TABS tablet Take 5 mg by mouth 2 (two) times daily.    . celecoxib  (CELEBREX) 200 MG capsule Take 200 mg by mouth 2 (two) times daily as needed.    . Cholecalciferol (VITAMIN D3) 2000 UNITS TABS Take 2,000 Units by mouth 4 (four) times a week.     . darifenacin (ENABLEX) 15 MG 24 hr tablet Take 1 tablet (15 mg total) by mouth every morning. 90 tablet 1  . doxycycline (VIBRA-TABS) 100 MG tablet Take 100 mg by mouth 2 (two) times daily. 6 month course started 05-2017    . gabapentin (NEURONTIN) 300 MG capsule Take 1 capsule (300 mg total) by mouth 3 (three) times daily. 90 capsule 6  . lapatinib (TYKERB) 250 MG tablet Take 750 mg by mouth every other day. Take on an empty stomach, at least 1 hour before or 1 hour after meals.    . Multiple Vitamin (MULTIVITAMIN WITH MINERALS) TABS tablet Take 1 tablet by mouth daily.    . ondansetron (ZOFRAN) 8 MG tablet TAKE 1 TABLET BY MOUTH EVERY 8 HOURS AS NEEDED FOR NAUSEA. 20 tablet 6  . Trastuzumab (HERCEPTIN IV) Inject into the vein.    [name] zolpidem (AMBIEN) 5 MG tablet TAKE 1 TABLET BY MOUTH ONCE DAILY AT BEDTIME AS NEEDED FOR SLEEP 10 tablet 0  . cetirizine (ZYRTEC) 10 MG tablet Take 1 tablet (10 mg total) daily by mouth. (Patient not taking: Reported on 01/21/2018) 30 tablet 11  . diphenoxylate-atropine (LOMOTIL) 2.5-0.025 MG tablet diphenoxylate-atropine 2.5 mg-0.025 mg tablet  TAKE 1 TABLET BY MOUTH 4 (FOUR) TIMES DAILY AS NEEDED FOR DIARRHEA    . docusate sodium (COLACE) 100 MG capsule Take 1 capsule (100 mg total) by mouth 2 (two) times daily. (Patient not taking: Reported on 01/21/2018) 10 capsule 0  . polyethylene glycol (MIRALAX / GLYCOLAX) packet Take 17 g by mouth 2 (two) times daily. (Patient not taking: Reported on 01/21/2018) 14 each 0  . prochlorperazine (COMPAZINE) 10 MG tablet TAKE 1 TABLET (10 MG TOTAL) BY MOUTH EVERY 6 (SIX) HOURS AS NEEDED FOR NAUSEA  3   No current facility-administered medications for this encounter.     Allergies  Allergen Reactions  . Penicillins Hives, Itching and Rash    Has patient  had a PCN reaction causing immediate rash, facial/tongue/throat swelling, SOB or lightheadedness with hypotension: yes Has patient had a PCN reaction causing severe rash involving mucus membranes or skin necrosis: No Has patient had a PCN reaction that required hospitalization No Has patient had a PCN reaction occurring within the last 10 years: yes If all of the above answers are "NO", then may proceed with Cephalosporin use.   Denies airway involvement   . Other Rash    STERI STRIPS - Blisters  . Tape Hives    Can tolerate paper tape    Social History   Socioeconomic History  . Marital status: Married    Spouse name: Not  on file  . Number of children: 3  . Years of education: Not on file  . Highest education level: Not on file  Occupational History  . 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 Financial resource strain: Not on file  . Food insecurity:    Worry: Not on file    Inability: Not on file  . Transportation needs:    Medical: Not on file    Non-medical: Not on file  Tobacco Use  . Smoking status: Never Smoker  . Smokeless tobacco: Never Used  Substance and Sexual Activity  . Alcohol use: No  . Drug use: No  . Sexual activity: Yes  Lifestyle  . Physical activity:    Days per week: Not on file    Minutes per session: Not on file  . Stress: Not on file  Relationships  . Social connections:    Talks on phone: Not on file    Gets together: Not on file    Attends religious service: Not on file    Active member of club or organization: Not on file    Attends meetings of clubs or organizations: Not on file    Relationship status: Not on file  . Intimate partner violence:    Fear of current or ex partner: Not on file    Emotionally abused: Not on file    Physically abused: Not on file    Forced sexual activity: Not on file  Other Topics Concern  . 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          Family History  Problem Relation Age of Onset  . Heart disease Mother        due to mitral valve regurgiation,   . Cancer Mother        breast  . Allergic rhinitis Mother   . Parkinsonism Father   . Allergic rhinitis Father   . Migraines Father   . Alcohol abuse Paternal Grandfather   . Angioedema Neg Hx   . Asthma Neg Hx   . Eczema Neg Hx     PHYSICAL EXAM: Vitals:   01/21/18 0903  BP: 140/82  Pulse: 92  SpO2: 93%  Weight: 75.9 kg (167 lb 6.4 oz)   General:  Well appearing. No resp difficulty HEENT: normal Neck: supple. no JVD. Carotids 2+ bilat; no bruits. No lymphadenopathy or thryomegaly appreciated. Cor: PMI nondisplaced. Regular rate & rhythm. No rubs, gallops or murmurs. S/p left mastectomy Lungs: clear Abdomen: soft, nontender, nondistended. No hepatosplenomegaly. No bruits or masses. Good bowel sounds. Extremities: no cyanosis, clubbing, rash, traceedema Neuro: alert & orientedx3, cranial nerves grossly intact. moves all 4 extremities w/o difficulty. Affect pleasant  ASSESSMENT & PLAN: 1. Breast CA, stage IV with skin mets - HER 2-neu+ , ER/PR -, BRCA - 2. S/p RTKA with prosthetic infection - resolved 3. PAH - CT chest No PE - VQ 5/19 No PE - [facility] CT chest 12/14/17 No comment on pulmonary artery opacification    She continues with evidence of PAH on echo. This is new over past few years. She is relatively asymptomatic but does have some RV dilation. VQ and CT negative for PE. She has had q3 month CT of the chest at [facility] and I reviewed these  and none of them comment on PA opacification (perhaps contrast timing not suitable?). Will plan RHC to further evalaute PA pressures.   Yavier Snider,MD 9:29 AM

## 2018-01-21 NOTE — Patient Instructions (Signed)
Right Heart Catheterization on Wed 01/27/18, see instruction sheet  Your physician recommends that you schedule a follow-up appointment in: 4 months with echocardiogram

## 2018-01-21 NOTE — Telephone Encounter (Signed)
See note.  Copied from [place] [id number]. Topic: Quick Communication - See Telephone Encounter >> Jan 21, 2018 12:47 PM Antonieta Iba C wrote: CRM for notification. See Telephone encounter for: 01/21/18.  Pt says that she was seen by Dr. Paulla Fore and was provided a copy of exercises, pt says that she has misplaced the paper and would like to know if provider is able to send them to her via my-chart.

## 2018-01-21 NOTE — Telephone Encounter (Signed)
Pt is sch for RHC 9/4, per Dr Haroldine Laws pt needs to hold Elqius all day Tue and Wed AM.   Attempted to call pt with instructions and Left message to call back

## 2018-01-21 NOTE — H&P (View-Only) (Signed)
Patient ID: [name], female   DOB: Jun 29, 1955, 62 y.o.   MRN: [id number]     Cardio-Oncology Clinic Note  PCP: Dr. 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   She is s/p bilateral mastectomies, XRT, chemo. She is currently on Kadcyla (trastuzamab-emantsine) every 3 weeks. Underwent skin flap for skim metastasis on her abdomen in 2/15  Had skin resection with chest skin flap on 02/21/15 at [facility]. Requiring wound vac. Path ok fortunately with no recurrent malignancy.  Had clots around port-a-cath so on Elqiuis.   I saw her last in May. Has had a very difficult year. Had R TKA which got infected and then had to have hardware removed and got a spacer. Hardware eventually replaced. Then developed a hematoma and wound. Chemo had to be stopped for 2 months and required a skin graft. Unfortunately when chemo stopped developed recurrent skin mets which she is now struggling with. Remains on lapatanib (taking every other day due to diarrhea) and herceptin. Had CT chest in 12/18 due to SOB. No PE but showed evidence of possible PAH. VQ ordered 5/19. No PE.  Was having left shoulder pain but bone scan negative. Was seen in Sports Medicine and got injected  Overall getting better. Knee pain getting better. Riding her stationary bike 77mns/day. No CP. No SOB or dizziness.   Echo today EF 60-65% Grade I DD RV mildly dilated with normal function Mod TR. Mild MR RVSP 73 IVC ok Personally reviewed   Echo 10/12/17 EF 60-65% GLS -17.5% RV mildly dilated with normal function RVSP 65 IVC ok Personally reviewed   ECHO 09/27/2013 EF 60-65% Lateral S'10.7 Grade 2 DD ECHO 02/27/2014 EF 60-65% Lateral S' 10.4  ECHO 07/14/2014  EF 60-65% Lateral S' 10.4 GLS -23.1% mild MR ECHO 04/18/2015  EF 60-65% Lateral S' 13.6 GLS -19.2% mild TR Trivial MR. RV ok ECHO 03/03/2016 EF 60-65% lateral S 11.8 GLS -19.2 poor window   Past Medical  History:  Diagnosis Date  . Arthritis   . Breast cancer metastasized to skin, right ([facility] followed by dr force -- oncolgoist w/ [facility]  primary left breast cancer dx 01/ 2009 ; 11/ 2009 recurrence left chest wall and neck, tx clinical trial drug and chemotherapy;  2015 recurrence cutaneous metastatized to right skin/ chest wall, 02/ 2015 resection chest wall disease and 02-23-2015 s/p skin flap surgery,  chemo every 3 weeks  . 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 Heart murmur   . 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 MVP (mitral valve prolapse)    mild mvp w/ mild regurg. per last echo 04-24-2017  . Neuropathy due to chemotherapeutic drug ([place] [place]   . Non-healing surgical wound    right knee post re-implantation total knee arthroplasty 04-2017 unable to completely close surgical incision  . PONV (postoperative nausea and vomiting)    ponv likes scopolamine patch  . Port-A-Cath in place    RIGHT CHEST   . Red blood cell antibody positive   . Skin changes related to chemotherapy  per patient she has scabbed over skin circumventing port to right upper chest ;  state it is due to chemptherapy   . 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 acetaminophen (TYLENOL) 500 MG tablet Take 500 mg by mouth every 8 (eight) hours as needed for mild pain or headache.    [name] apixaban (ELIQUIS) 5 MG TABS tablet Take 5 mg by mouth 2 (two) times daily.    . celecoxib  (CELEBREX) 200 MG capsule Take 200 mg by mouth 2 (two) times daily as needed.    . Cholecalciferol (VITAMIN D3) 2000 UNITS TABS Take 2,000 Units by mouth 4 (four) times a week.     . darifenacin (ENABLEX) 15 MG 24 hr tablet Take 1 tablet (15 mg total) by mouth every morning. 90 tablet 1  . doxycycline (VIBRA-TABS) 100 MG tablet Take 100 mg by mouth 2 (two) times daily. 6 month course started 05-2017    . gabapentin (NEURONTIN) 300 MG capsule Take 1 capsule (300 mg total) by mouth 3 (three) times daily. 90 capsule 6  . lapatinib (TYKERB) 250 MG tablet Take 750 mg by mouth every other day. Take on an empty stomach, at least 1 hour before or 1 hour after meals.    . Multiple Vitamin (MULTIVITAMIN WITH MINERALS) TABS tablet Take 1 tablet by mouth daily.    . ondansetron (ZOFRAN) 8 MG tablet TAKE 1 TABLET BY MOUTH EVERY 8 HOURS AS NEEDED FOR NAUSEA. 20 tablet 6  . Trastuzumab (HERCEPTIN IV) Inject into the vein.    [name] zolpidem (AMBIEN) 5 MG tablet TAKE 1 TABLET BY MOUTH ONCE DAILY AT BEDTIME AS NEEDED FOR SLEEP 10 tablet 0  . cetirizine (ZYRTEC) 10 MG tablet Take 1 tablet (10 mg total) daily by mouth. (Patient not taking: Reported on 01/21/2018) 30 tablet 11  . diphenoxylate-atropine (LOMOTIL) 2.5-0.025 MG tablet diphenoxylate-atropine 2.5 mg-0.025 mg tablet  TAKE 1 TABLET BY MOUTH 4 (FOUR) TIMES DAILY AS NEEDED FOR DIARRHEA    . docusate sodium (COLACE) 100 MG capsule Take 1 capsule (100 mg total) by mouth 2 (two) times daily. (Patient not taking: Reported on 01/21/2018) 10 capsule 0  . polyethylene glycol (MIRALAX / GLYCOLAX) packet Take 17 g by mouth 2 (two) times daily. (Patient not taking: Reported on 01/21/2018) 14 each 0  . prochlorperazine (COMPAZINE) 10 MG tablet TAKE 1 TABLET (10 MG TOTAL) BY MOUTH EVERY 6 (SIX) HOURS AS NEEDED FOR NAUSEA  3   No current facility-administered medications for this encounter.     Allergies  Allergen Reactions  . Penicillins Hives, Itching and Rash    Has patient  had a PCN reaction causing immediate rash, facial/tongue/throat swelling, SOB or lightheadedness with hypotension: yes Has patient had a PCN reaction causing severe rash involving mucus membranes or skin necrosis: No Has patient had a PCN reaction that required hospitalization No Has patient had a PCN reaction occurring within the last 10 years: yes If all of the above answers are "NO", then may proceed with Cephalosporin use.   Denies airway involvement   . Other Rash    STERI STRIPS - Blisters  . Tape Hives    Can tolerate paper tape    Social History   Socioeconomic History  . Marital status: Married    Spouse name: Not  on file  . Number of children: 3  . Years of education: Not on file  . Highest education level: Not on file  Occupational History  . 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 Financial resource strain: Not on file  . Food insecurity:    Worry: Not on file    Inability: Not on file  . Transportation needs:    Medical: Not on file    Non-medical: Not on file  Tobacco Use  . Smoking status: Never Smoker  . Smokeless tobacco: Never Used  Substance and Sexual Activity  . Alcohol use: No  . Drug use: No  . Sexual activity: Yes  Lifestyle  . Physical activity:    Days per week: Not on file    Minutes per session: Not on file  . Stress: Not on file  Relationships  . Social connections:    Talks on phone: Not on file    Gets together: Not on file    Attends religious service: Not on file    Active member of club or organization: Not on file    Attends meetings of clubs or organizations: Not on file    Relationship status: Not on file  . Intimate partner violence:    Fear of current or ex partner: Not on file    Emotionally abused: Not on file    Physically abused: Not on file    Forced sexual activity: Not on file  Other Topics Concern  . 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          Family History  Problem Relation Age of Onset  . Heart disease Mother        due to mitral valve regurgiation,   . Cancer Mother        breast  . Allergic rhinitis Mother   . Parkinsonism Father   . Allergic rhinitis Father   . Migraines Father   . Alcohol abuse Paternal Grandfather   . Angioedema Neg Hx   . Asthma Neg Hx   . Eczema Neg Hx     PHYSICAL EXAM: Vitals:   01/21/18 0903  BP: 140/82  Pulse: 92  SpO2: 93%  Weight: 75.9 kg (167 lb 6.4 oz)   General:  Well appearing. No resp difficulty HEENT: normal Neck: supple. no JVD. Carotids 2+ bilat; no bruits. No lymphadenopathy or thryomegaly appreciated. Cor: PMI nondisplaced. Regular rate & rhythm. No rubs, gallops or murmurs. S/p left mastectomy Lungs: clear Abdomen: soft, nontender, nondistended. No hepatosplenomegaly. No bruits or masses. Good bowel sounds. Extremities: no cyanosis, clubbing, rash, traceedema Neuro: alert & orientedx3, cranial nerves grossly intact. moves all 4 extremities w/o difficulty. Affect pleasant  ASSESSMENT & PLAN: 1. Breast CA, stage IV with skin mets - HER 2-neu+ , ER/PR -, BRCA - 2. S/p RTKA with prosthetic infection - resolved 3. PAH - CT chest No PE - VQ 5/19 No PE - [facility] CT chest 12/14/17 No comment on pulmonary artery opacification    She continues with evidence of PAH on echo. This is new over past few years. She is relatively asymptomatic but does have some RV dilation. VQ and CT negative for PE. She has had q3 month CT of the chest at [facility] and I reviewed these  and none of them comment on PA opacification (perhaps contrast timing not suitable?). Will plan RHC to further evalaute PA pressures.   Dailey Buccheri,MD 9:29 AM

## 2018-01-21 NOTE — Progress Notes (Signed)
  Echocardiogram 2D Echocardiogram has been performed.  Merrie Roof F 01/21/2018, 11:34 AM

## 2018-01-21 NOTE — Telephone Encounter (Signed)
Message sent to pt via MyChart with exercise link and code.

## 2018-01-27 ENCOUNTER — Ambulatory Visit (HOSPITAL_COMMUNITY)
Admission: RE | Admit: 2018-01-27 | Discharge: 2018-01-27 | Disposition: A | Discharge status: Home / Self Care | Payer: 59 | Source: Ambulatory Visit | Attending: Internal Medicine | Admitting: Internal Medicine

## 2018-01-27 ENCOUNTER — Encounter (HOSPITAL_COMMUNITY): Payer: Self-pay | Admitting: Internal Medicine

## 2018-01-27 ENCOUNTER — Encounter (HOSPITAL_COMMUNITY)
Admission: RE | Disposition: A | Discharge status: Home / Self Care | Payer: Self-pay | Source: Ambulatory Visit | Attending: Internal Medicine

## 2018-01-27 DIAGNOSIS — C50911 Malignant neoplasm of unspecified site of right female breast: Secondary | ICD-10-CM | POA: Diagnosis not present

## 2018-01-27 DIAGNOSIS — I2721 Secondary pulmonary arterial hypertension: Secondary | ICD-10-CM | POA: Insufficient documentation

## 2018-01-27 DIAGNOSIS — Z7901 Long term (current) use of anticoagulants: Secondary | ICD-10-CM | POA: Diagnosis not present

## 2018-01-27 DIAGNOSIS — Z79899 Other long term (current) drug therapy: Secondary | ICD-10-CM | POA: Diagnosis not present

## 2018-01-27 DIAGNOSIS — M1990 Unspecified osteoarthritis, unspecified site: Secondary | ICD-10-CM | POA: Diagnosis not present

## 2018-01-27 DIAGNOSIS — Z803 Family history of malignant neoplasm of breast: Secondary | ICD-10-CM | POA: Insufficient documentation

## 2018-01-27 DIAGNOSIS — I341 Nonrheumatic mitral (valve) prolapse: Secondary | ICD-10-CM | POA: Insufficient documentation

## 2018-01-27 DIAGNOSIS — Z880 Allergy status to penicillin: Secondary | ICD-10-CM | POA: Insufficient documentation

## 2018-01-27 DIAGNOSIS — Z96651 Presence of right artificial knee joint: Secondary | ICD-10-CM | POA: Diagnosis not present

## 2018-01-27 DIAGNOSIS — Z888 Allergy status to other drugs, medicaments and biological substances status: Secondary | ICD-10-CM | POA: Diagnosis not present

## 2018-01-27 DIAGNOSIS — I272 Pulmonary hypertension, unspecified: Secondary | ICD-10-CM

## 2018-01-27 DIAGNOSIS — Z9889 Other specified postprocedural states: Secondary | ICD-10-CM | POA: Diagnosis not present

## 2018-01-27 DIAGNOSIS — C792 Secondary malignant neoplasm of skin: Secondary | ICD-10-CM | POA: Diagnosis not present

## 2018-01-27 DIAGNOSIS — R011 Cardiac murmur, unspecified: Secondary | ICD-10-CM | POA: Diagnosis not present

## 2018-01-27 DIAGNOSIS — Z171 Estrogen receptor negative status [ER-]: Secondary | ICD-10-CM | POA: Insufficient documentation

## 2018-01-27 DIAGNOSIS — Z8249 Family history of ischemic heart disease and other diseases of the circulatory system: Secondary | ICD-10-CM | POA: Insufficient documentation

## 2018-01-27 DIAGNOSIS — Z9013 Acquired absence of bilateral breasts and nipples: Secondary | ICD-10-CM | POA: Diagnosis not present

## 2018-01-27 HISTORY — PX: RIGHT HEART CATH: CATH118263

## 2018-01-27 LAB — POCT I-STAT 3, VENOUS BLOOD GAS (G3P V)
Acid-Base Excess: 1 mmol/L (ref 0.0–2.0)
Acid-Base Excess: 1 mmol/L (ref 0.0–2.0)
BICARBONATE: 24.7 mmol/L (ref 20.0–28.0)
BICARBONATE: 26.7 mmol/L (ref 20.0–28.0)
Bicarbonate: 26.9 mmol/L (ref 20.0–28.0)
O2 SAT: 79 %
O2 Saturation: 79 %
O2 Saturation: 79 %
PCO2 VEN: 41.2 mmHg — AB (ref 44.0–60.0)
PCO2 VEN: 45.1 mmHg (ref 44.0–60.0)
PH VEN: 7.386 (ref 7.250–7.430)
PO2 VEN: 44 mmHg (ref 32.0–45.0)
PO2 VEN: 44 mmHg (ref 32.0–45.0)
TCO2: 26 mmol/L (ref 22–32)
TCO2: 28 mmol/L (ref 22–32)
TCO2: 28 mmol/L (ref 22–32)
pCO2, Ven: 45.8 mmHg (ref 44.0–60.0)
pH, Ven: 7.376 (ref 7.250–7.430)
pH, Ven: 7.38 (ref 7.250–7.430)
pO2, Ven: 45 mmHg (ref 32.0–45.0)

## 2018-01-27 SURGERY — RIGHT HEART CATH
Anesthesia: LOCAL

## 2018-01-27 MED ORDER — ASPIRIN 81 MG PO CHEW
81.0000 mg | CHEWABLE_TABLET | ORAL | Status: AC
  Administered 2018-01-27: 81 mg via ORAL
  Filled 2018-01-27: qty 1

## 2018-01-27 MED ORDER — SODIUM CHLORIDE 0.9% FLUSH: 3.0000 mL | Freq: Two times a day (BID) | INTRAVENOUS | Status: DC

## 2018-01-27 MED ORDER — LIDOCAINE HCL (PF) 1 % IJ SOLN
INTRAMUSCULAR | Status: DC | PRN
  Administered 2018-01-27: 2 mL via INTRADERMAL

## 2018-01-27 MED ORDER — HEPARIN (PORCINE) IN NACL 1000-0.9 UT/500ML-% IV SOLN
INTRAVENOUS | Status: AC
  Filled 2018-01-27: qty 500

## 2018-01-27 MED ORDER — SODIUM CHLORIDE 0.9% FLUSH: 3.0000 mL | INTRAVENOUS | Status: DC | PRN

## 2018-01-27 MED ORDER — SODIUM CHLORIDE 0.9 % IV SOLN: 250.0000 mL | INTRAVENOUS | Status: DC | PRN

## 2018-01-27 MED ORDER — HEPARIN (PORCINE) IN NACL 1000-0.9 UT/500ML-% IV SOLN
INTRAVENOUS | Status: DC | PRN
  Administered 2018-01-27: 500 mL

## 2018-01-27 MED ORDER — ONDANSETRON HCL 4 MG/2ML IJ SOLN: 4.0000 mg | Freq: Four times a day (QID) | INTRAMUSCULAR | Status: DC | PRN

## 2018-01-27 MED ORDER — ACETAMINOPHEN 325 MG PO TABS: 650.0000 mg | ORAL_TABLET | ORAL | Status: DC | PRN

## 2018-01-27 MED ORDER — SODIUM CHLORIDE 0.9 % IV SOLN
INTRAVENOUS | Status: DC
  Administered 2018-01-27: 08:00:00 via INTRAVENOUS

## 2018-01-27 MED ORDER — LIDOCAINE HCL (PF) 1 % IJ SOLN
INTRAMUSCULAR | Status: AC
  Filled 2018-01-27: qty 30

## 2018-01-27 SURGICAL SUPPLY — 5 items
CATH SWAN GANZ 7F STRAIGHT (CATHETERS) ×1 IMPLANT
PACK CARDIAC CATHETERIZATION (CUSTOM PROCEDURE TRAY) ×2 IMPLANT
SHEATH PINNACLE 7F 10CM (SHEATH) ×1 IMPLANT
SHEATH PROBE COVER 6X72 (BAG) ×1 IMPLANT
TRANSDUCER W/STOPCOCK (MISCELLANEOUS) ×2 IMPLANT

## 2018-01-27 NOTE — Progress Notes (Signed)
Site area:  Rt IJ venous sheath Site Prior to Removal:  Level 0 Pressure Applied For:  10 minutes Manual:   yes Patient Status During Pull:  Stable  Post Pull Site:  Level 0 Post Pull Instructions Given:  yes Post Pull Pulses Present: NA Dressing Applied:  Gauze and tegaderm Bedrest begins @ 1005 Comments:

## 2018-01-27 NOTE — Interval H&P Note (Signed)
History and Physical Interval Note:  01/27/2018 8:55 AM  [name]  has presented today for surgery, with the diagnosis of Pulmonary HTN  The various methods of treatment have been discussed with the patient and family. After consideration of risks, benefits and other options for treatment, the patient has consented to  Procedure(s): RIGHT HEART CATH (N/A) as a surgical intervention .  The patient's history has been reviewed, patient examined, no change in status, stable for surgery.  I have reviewed the patient's chart and labs.  Questions were answered to the patient's satisfaction.     Vasiliy Mccarry

## 2018-01-27 NOTE — Discharge Instructions (Signed)
IJ Site Care Refer to this sheet in the next few weeks. These instructions provide you with information about caring for yourself after your procedure. Your health care provider may also give you more specific instructions. Your treatment has been planned according to current medical practices, but problems sometimes occur. Call your health care provider if you have any problems or questions after your procedure. What can I expect after the procedure? After your procedure, it is typical to have the following:  Bruising at the site that usually fades within 1-2 weeks.  Follow these instructions at home:  Take medicines only as directed by your health care provider.  You may shower 24-48 hours after the procedure or as directed by your health care provider. Remove the bandage (dressing) and gently wash the site with plain soap and water. Pat the area dry with a clean towel. Do not rub the site, because this may cause bleeding.  Do not take baths, swim, or use a hot tub until your health care provider approves.  Check your insertion site every day for redness, swelling, or drainage.  Do not apply powder or lotion to the site.  Do not push or pull heavy objects with the affected arm for 24 hours or as directed by your health care provider.  Do not lift over 10 lb (4.5 kg) for 5 days after your procedure or as directed by your health care provider.  Ask your health care provider when it is okay to: ? Return to work or school. ? Resume usual physical activities or sports. ? Resume sexual activity.  Do not drive home if you are discharged the same day as the procedure. Have someone else drive you.  You may drive 24 hours after the procedure unless otherwise instructed by your health care provider.  Do not operate machinery or power tools for 24 hours after the procedure.  If your procedure was done as an outpatient procedure, which means that you went home the same day as your procedure, a  responsible adult should be with you for the first 24 hours after you arrive home.  Keep all follow-up visits as directed by your health care provider. This is important. Contact a health care provider if:  You have a fever.  You have chills.  You have increased bleeding from the radial site. Hold pressure on the site. Get help right away if:  You have unusual pain at the site.  You have drainage (other than a small amount of blood on the dressing) from the site.  The site is bleeding, and the bleeding does not stop after 30 minutes of holding steady pressure on the site. This information is not intended to replace advice given to you by your health care provider. Make sure you discuss any questions you have with your health care provider. Document Released: 06/14/2010 Document Revised: 10/18/2015 Document Reviewed: 11/28/2013 Elsevier Interactive Patient Education  2018 [employer].

## 2018-01-28 ENCOUNTER — Telehealth (HOSPITAL_COMMUNITY): Payer: Self-pay | Admitting: *Deleted

## 2018-01-28 MED FILL — DOXYCYCLINE MONO 100 MG CAP: 100 | 30 days supply | Qty: 60 | Fill #0

## 2018-01-28 MED FILL — TYKERB 250 MG TABLET: 250 | 28 days supply | Qty: 84 | Fill #1

## 2018-01-28 MED FILL — CELECOXIB 200 MG CAP: 200 | 30 days supply | Qty: 60 | Fill #1

## 2018-01-28 MED FILL — ELIQUIS 5 MG TABLET: 5 | 30 days supply | Qty: 60 | Fill #4

## 2018-01-28 NOTE — Telephone Encounter (Signed)
Patient called and provided her oncologist/radiolgist contact information for Dr. Maudry Mayhew name is Dr. Concha Pyo and her phone number is [phone].

## 2018-01-29 ENCOUNTER — Telehealth (HOSPITAL_COMMUNITY): Payer: Self-pay | Admitting: *Deleted

## 2018-01-29 MED ORDER — SILDENAFIL CITRATE 20 MG PO TABS: 20.0000 mg | ORAL_TABLET | Freq: Three times a day (TID) | ORAL | 3 refills | Status: DC

## 2018-01-29 NOTE — Telephone Encounter (Signed)
Per Dr Haroldine Laws pt needs to start Sildenafil 20 mg TID per [facility] results.  Pt is aware and rx sent in

## 2018-02-02 DIAGNOSIS — Z5111 Encounter for antineoplastic chemotherapy: Secondary | ICD-10-CM | POA: Diagnosis not present

## 2018-02-02 DIAGNOSIS — E109 Type 1 diabetes mellitus without complications: Secondary | ICD-10-CM | POA: Diagnosis not present

## 2018-02-02 DIAGNOSIS — R197 Diarrhea, unspecified: Secondary | ICD-10-CM | POA: Diagnosis not present

## 2018-02-02 DIAGNOSIS — R7881 Bacteremia: Secondary | ICD-10-CM | POA: Diagnosis not present

## 2018-02-02 DIAGNOSIS — C50412 Malignant neoplasm of upper-outer quadrant of left female breast: Secondary | ICD-10-CM | POA: Diagnosis not present

## 2018-02-02 DIAGNOSIS — Z5112 Encounter for antineoplastic immunotherapy: Secondary | ICD-10-CM | POA: Diagnosis not present

## 2018-02-02 DIAGNOSIS — I272 Pulmonary hypertension, unspecified: Secondary | ICD-10-CM | POA: Diagnosis not present

## 2018-02-02 DIAGNOSIS — Z171 Estrogen receptor negative status [ER-]: Secondary | ICD-10-CM | POA: Diagnosis not present

## 2018-02-02 DIAGNOSIS — C50912 Malignant neoplasm of unspecified site of left female breast: Secondary | ICD-10-CM | POA: Diagnosis not present

## 2018-02-02 DIAGNOSIS — I340 Nonrheumatic mitral (valve) insufficiency: Secondary | ICD-10-CM | POA: Diagnosis not present

## 2018-02-02 DIAGNOSIS — R748 Abnormal levels of other serum enzymes: Secondary | ICD-10-CM | POA: Diagnosis not present

## 2018-02-04 ENCOUNTER — Telehealth (HOSPITAL_COMMUNITY): Payer: Self-pay | Admitting: Pharmacist

## 2018-02-04 NOTE — Telephone Encounter (Signed)
Sildenafil 20 mg TID PA approved by [employer] through 01/30/19.   Ruta Hinds. Velva Harman, PharmD, BCPS, CPP Clinical Pharmacist Phone: [phone] 02/04/2018 11:10 AM

## 2018-02-05 ENCOUNTER — Encounter: Payer: Self-pay | Admitting: Family Medicine

## 2018-02-05 ENCOUNTER — Other Ambulatory Visit: Payer: Self-pay | Admitting: Family Medicine

## 2018-02-05 DIAGNOSIS — R350 Frequency of micturition: Secondary | ICD-10-CM

## 2018-02-05 DIAGNOSIS — R3915 Urgency of urination: Secondary | ICD-10-CM

## 2018-02-08 ENCOUNTER — Other Ambulatory Visit: Payer: Self-pay

## 2018-02-08 ENCOUNTER — Ambulatory Visit (INDEPENDENT_AMBULATORY_CARE_PROVIDER_SITE_OTHER): Payer: 59 | Admitting: Family Medicine

## 2018-02-08 ENCOUNTER — Encounter: Payer: Self-pay | Admitting: Family Medicine

## 2018-02-08 VITALS — BP 110/80 | HR 87 | Temp 98.1°F | Resp 16 | Ht 67.0 in | Wt 174.5 lb

## 2018-02-08 DIAGNOSIS — R82998 Other abnormal findings in urine: Secondary | ICD-10-CM

## 2018-02-08 DIAGNOSIS — N3941 Urge incontinence: Secondary | ICD-10-CM

## 2018-02-08 DIAGNOSIS — R350 Frequency of micturition: Secondary | ICD-10-CM

## 2018-02-08 LAB — POCT URINALYSIS DIPSTICK
Bilirubin, UA: NEGATIVE
Blood, UA: NEGATIVE
Glucose, UA: NEGATIVE
KETONES UA: NEGATIVE
Nitrite, UA: NEGATIVE
PH UA: 5 (ref 5.0–8.0)
Protein, UA: POSITIVE — AB
SPEC GRAV UA: 1.015 (ref 1.010–1.025)
UROBILINOGEN UA: 0.2 U/dL

## 2018-02-08 MED ORDER — MIRABEGRON ER 25 MG PO TB24: 25.0000 mg | ORAL_TABLET | Freq: Every day | ORAL | 3 refills | Status: AC

## 2018-02-08 NOTE — Patient Instructions (Signed)
Follow up as needed or as scheduled We'll call you with your urology appt and notify you of your culture results Increase your water intake STOP the Enablex START the Myrbetriq once daily Call with any questions or concerns Hang in there!

## 2018-02-08 NOTE — Addendum Note (Signed)
Addended bySigurd Sos on: 02/08/2018 11:14 AM   Modules accepted: Orders

## 2018-02-08 NOTE — Progress Notes (Signed)
   Subjective:    Patient ID: UBAH RADKE, female    DOB: November 26, 1955, 62 y.o.   MRN: [id number]  HPI Urinary urgency- inability to hold bladder until she makes it to the bathroom).  sxs have been present for 'months' but have been worsening.  Denies frequency.  On Enablex which 'works really well until 3:00 and then it wears off'.  Denies burning w/ urination.  + incomplete emptying.   Review of Systems For ROS see HPI     Objective:   Physical Exam  Constitutional: She is oriented to person, place, and time. She appears well-developed and well-nourished. No distress.  HENT:  Head: Normocephalic and atraumatic.  Abdominal: She exhibits no distension. There is no tenderness (no suprapubic or CVA TTP).  Neurological: She is alert and oriented to person, place, and time.  Skin: Skin is warm and dry.  Psychiatric: She has a normal mood and affect. Her behavior is normal. Thought content normal.  Vitals reviewed.         Assessment & Plan:  Urge incontinence- ongoing.  sxs are worsening despite being on Enablex.  Referral to urology was placed last week but pt is struggling w/ her sxs.  Switch to Myrbetriq while awaiting urology appt.  Will follow.

## 2018-02-10 LAB — URINE CULTURE
MICRO NUMBER:: 91109223
SPECIMEN QUALITY:: ADEQUATE

## 2018-02-10 MED FILL — GABAPENTIN 300 MG CAPSULE: 300 | 30 days supply | Qty: 90 | Fill #1

## 2018-02-18 MED FILL — TYKERB 250 MG TABLET: 250 | 28 days supply | Qty: 112 | Fill #0

## 2018-02-23 DIAGNOSIS — Z5112 Encounter for antineoplastic immunotherapy: Secondary | ICD-10-CM | POA: Diagnosis not present

## 2018-02-23 DIAGNOSIS — C792 Secondary malignant neoplasm of skin: Secondary | ICD-10-CM | POA: Diagnosis not present

## 2018-02-23 DIAGNOSIS — C50412 Malignant neoplasm of upper-outer quadrant of left female breast: Secondary | ICD-10-CM | POA: Diagnosis not present

## 2018-02-25 ENCOUNTER — Encounter: Payer: Self-pay | Admitting: Family Medicine

## 2018-02-25 ENCOUNTER — Ambulatory Visit (INDEPENDENT_AMBULATORY_CARE_PROVIDER_SITE_OTHER): Payer: 59 | Admitting: Family Medicine

## 2018-02-25 ENCOUNTER — Other Ambulatory Visit: Payer: Self-pay

## 2018-02-25 VITALS — BP 110/74 | HR 77 | Temp 98.2°F | Resp 18 | Ht 67.0 in | Wt 177.2 lb

## 2018-02-25 DIAGNOSIS — E559 Vitamin D deficiency, unspecified: Secondary | ICD-10-CM

## 2018-02-25 DIAGNOSIS — Z0000 Encounter for general adult medical examination without abnormal findings: Secondary | ICD-10-CM | POA: Diagnosis not present

## 2018-02-25 DIAGNOSIS — T451X5A Adverse effect of antineoplastic and immunosuppressive drugs, initial encounter: Secondary | ICD-10-CM

## 2018-02-25 DIAGNOSIS — E785 Hyperlipidemia, unspecified: Secondary | ICD-10-CM | POA: Diagnosis not present

## 2018-02-25 DIAGNOSIS — G620 Drug-induced polyneuropathy: Secondary | ICD-10-CM | POA: Diagnosis not present

## 2018-02-25 LAB — CBC WITH DIFFERENTIAL/PLATELET
BASOS ABS: 0 10*3/uL (ref 0.0–0.1)
BASOS PCT: 0.8 % (ref 0.0–3.0)
EOS ABS: 0.2 10*3/uL (ref 0.0–0.7)
Eosinophils Relative: 5 % (ref 0.0–5.0)
HEMATOCRIT: 37.5 % (ref 36.0–46.0)
Hemoglobin: 12.4 g/dL (ref 12.0–15.0)
LYMPHS ABS: 0.8 10*3/uL (ref 0.7–4.0)
LYMPHS PCT: 18.6 % (ref 12.0–46.0)
MCHC: 33 g/dL (ref 30.0–36.0)
MCV: 96.8 fl (ref 78.0–100.0)
MONO ABS: 0.3 10*3/uL (ref 0.1–1.0)
Monocytes Relative: 8.3 % (ref 3.0–12.0)
NEUTROS ABS: 2.7 10*3/uL (ref 1.4–7.7)
NEUTROS PCT: 67.3 % (ref 43.0–77.0)
PLATELETS: 154 10*3/uL (ref 150.0–400.0)
RBC: 3.88 Mil/uL (ref 3.87–5.11)
RDW: 15.4 % (ref 11.5–15.5)
WBC: 4.1 10*3/uL (ref 4.0–10.5)

## 2018-02-25 LAB — HEPATIC FUNCTION PANEL
ALT: 42 U/L — AB (ref 0–35)
AST: 56 U/L — AB (ref 0–37)
Albumin: 3.3 g/dL — ABNORMAL LOW (ref 3.5–5.2)
Alkaline Phosphatase: 185 U/L — ABNORMAL HIGH (ref 39–117)
BILIRUBIN DIRECT: 0.3 mg/dL (ref 0.0–0.3)
BILIRUBIN TOTAL: 2 mg/dL — AB (ref 0.2–1.2)
Total Protein: 6.1 g/dL (ref 6.0–8.3)

## 2018-02-25 LAB — BASIC METABOLIC PANEL
BUN: 20 mg/dL (ref 6–23)
CALCIUM: 8.5 mg/dL (ref 8.4–10.5)
CO2: 32 meq/L (ref 19–32)
Chloride: 104 mEq/L (ref 96–112)
Creatinine, Ser: 0.62 mg/dL (ref 0.40–1.20)
GFR: 103.74 mL/min (ref >60.00)
Glucose, Bld: 85 mg/dL (ref 70–99)
Potassium: 3.9 mEq/L (ref 3.5–5.1)
Sodium: 139 mEq/L (ref 135–145)

## 2018-02-25 LAB — LIPID PANEL
CHOLESTEROL: 183 mg/dL (ref 0–200)
HDL: 92.2 mg/dL (ref >39.00)
LDL Cholesterol: 76 mg/dL (ref 0–99)
NonHDL: 90.75
TRIGLYCERIDES: 73 mg/dL (ref 0.0–149.0)
Total CHOL/HDL Ratio: 2
VLDL: 14.6 mg/dL (ref 0.0–40.0)

## 2018-02-25 LAB — VITAMIN D 25 HYDROXY (VIT D DEFICIENCY, FRACTURES): VITD: 40.69 ng/mL (ref 30.00–100.00)

## 2018-02-25 LAB — TSH: TSH: 1.5 u[IU]/mL (ref 0.35–4.50)

## 2018-02-25 MED FILL — MYRBETRIQ ER 25 MG TABLET: 25 | 30 days supply | Qty: 30 | Fill #0

## 2018-02-25 NOTE — Assessment & Plan Note (Addendum)
Unchanged from previous, 'it's not too bad'.  On gabapentin

## 2018-02-25 NOTE — Assessment & Plan Note (Signed)
Pt's PE unchanged from previous.  UTD on cologuard.  UTD on immunizations.  Check labs.  Anticipatory guidance provided.

## 2018-02-25 NOTE — Patient Instructions (Signed)
Follow up in 1 year or as needed We'll notify you of your lab results and make any changes if needed Keep up the good work on healthy diet and regular exercise- you look great! Call with any questions or concerns Happy Fall!!!

## 2018-02-25 NOTE — Assessment & Plan Note (Signed)
Pt has hx of Vit D deficiency.  Check labs and replete prn. 

## 2018-02-25 NOTE — Progress Notes (Signed)
   Subjective:    Patient ID: [name], female    DOB: 1955-12-07, 62 y.o.   MRN: [id number]  HPI CPE- UTD on cologuard.  Currently undergoing breast cancer tx.  UTD on Tdap and flu.   Review of Systems Patient reports no vision/ hearing changes, adenopathy,fever, weight change,  persistant/recurrent hoarseness , swallowing issues, chest pain, palpitations, edema, persistant/recurrent cough, hemoptysis, dyspnea (rest/exertional/paroxysmal nocturnal), gastrointestinal bleeding (melena, rectal bleeding), abdominal pain, significant heartburn, bowel changes, GU symptoms (dysuria, hematuria, incontinence), Gyn symptoms (abnormal  bleeding, pain),  syncope, focal weakness, memory loss, skin/hair/nail changes, abnormal bruising or bleeding, anxiety, or depression.   + numbness due to chemo    Objective:   Physical Exam General Appearance:    Alert, cooperative, no distress, appears stated age  Head:    Normocephalic, without obvious abnormality, atraumatic  Eyes:    PERRL, conjunctiva/corneas clear, EOM's intact, fundi    benign, both eyes  Ears:    Normal TM's and external ear canals, both ears  Nose:   Nares normal, septum midline, mucosa normal, no drainage    or sinus tenderness  Throat:   Lips, mucosa, and tongue normal; teeth and gums normal  Neck:   Supple, symmetrical, trachea midline, no adenopathy;    Thyroid: no enlargement/tenderness/nodules  Back:     Symmetric, no curvature, ROM normal, no CVA tenderness  Lungs:     Clear to auscultation bilaterally, respirations unlabored  Chest Wall:    No tenderness or deformity   Heart:    Regular rate and rhythm, S1 and S2 normal, no murmur, rub   or gallop  Breast Exam:    Deferred to GYN  Abdomen:     Soft, non-tender, bowel sounds active all four quadrants,    no masses, no organomegaly  Genitalia:    Deferred to GYN  Rectal:    Extremities:   Extremities normal, atraumatic, no cyanosis or edema  Pulses:   2+ and symmetric all  extremities  Skin:   Skin color, texture, turgor normal  Lymph nodes:   Cervical, supraclavicular, and axillary nodes normal  Neurologic:   CNII-XII intact, normal strength, and reflexes    throughout          Assessment & Plan:

## 2018-02-26 ENCOUNTER — Other Ambulatory Visit (INDEPENDENT_AMBULATORY_CARE_PROVIDER_SITE_OTHER): Payer: 59

## 2018-02-26 DIAGNOSIS — R748 Abnormal levels of other serum enzymes: Secondary | ICD-10-CM | POA: Diagnosis not present

## 2018-02-26 LAB — GAMMA GT: GGT: 54 U/L — ABNORMAL HIGH (ref 7–51)

## 2018-03-01 MED FILL — DOXYCYCLINE MONO 100 MG CAP: 100 | 30 days supply | Qty: 60 | Fill #1

## 2018-03-01 MED FILL — ELIQUIS 5 MG TABLET: 5 | 30 days supply | Qty: 60 | Fill #5

## 2018-03-04 DIAGNOSIS — J3800 Paralysis of vocal cords and larynx, unspecified: Secondary | ICD-10-CM | POA: Diagnosis not present

## 2018-03-11 NOTE — H&P (Signed)
HPI:   [name] is a 62 y.o. female who presents as a new Patient.   Referring Provider: Self, A Referral  Chief complaint: Hoarseness.  HPI: 82-month history of chronic hoarseness. She denies sore throat, trouble swallowing, any other reflux type symptoms. She has no dietary risk factors for reflux. 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 That was around the time that the hoarseness started.  PMH/Meds/All/SocHx/FamHx/ROS:   Past Medical History:  Diagnosis Date  . Allergy  . Arthritis  . Cancer ([place])  . H/O mastectomy  . Wisdom teeth extracted   Past Surgical History:  Procedure Laterality Date  . REPLACEMENT TOTAL KNEE BILATERAL  . WISDOM TOOTH EXTRACTION   No family history of bleeding disorders, wound healing problems or difficulty with anesthesia.   Social History   Social History  . Marital status: Married  Spouse name: N/A  . Number of children: N/A  . Years of education: N/A   Occupational History  . Not on file.   Social History Main Topics  . Smoking status: Never Smoker  . Smokeless tobacco: Never Used  . Alcohol use No  . Drug use: No  . Sexual activity: Not on file   Other Topics Concern  . Not on file   Social History Narrative  . No narrative on file   Current Outpatient Prescriptions:  . acetaminophen (TYLENOL) 500 MG tablet, 500 mg., Disp: , Rfl:  . apixaban (ELIQUIS) 5 mg tablet *ANTICOAGULANT*, Take 5 mg by mouth., Disp: , Rfl:  . celecoxib (CELEBREX) 200 MG capsule, Take by mouth., Disp: , Rfl:  . cetirizine (ZYRTEC) 10 MG tablet, Take by mouth., Disp: , Rfl:  . cholecalciferol, vitamin D3, 2,000 unit Cap, Take 2,000 Units by mouth., Disp: , Rfl:  . darifenacin (ENABLEX) 15 mg 24 hr tablet, Take by mouth., Disp: , Rfl:  . docusate sodium (COLACE) 100 MG capsule, 100 mg., Disp: , Rfl:  . doxycycline (VIBRAMYCIN) 100 MG capsule, Take 100  mg by mouth 2 times daily., Disp: , Rfl:  . gabapentin (NEURONTIN) 300 MG capsule, TAKE 1 CAPSULE BY MOUTH 3 TIMES DAILY., Disp: , Rfl:  . lapatinib (TYKERB) 250 mg tablet, Take 750 mg by mouth., Disp: , Rfl:  . lidocaine-prilocaine (EMLA) 2.5-2.5 % cream, APPLY TOPICALLY AS NEEDED PRECHEMO APPLY PRETREATMENT TO SKIN OVER PORT; PLACE BARRIER ON TOP OF THE CREAM, Disp: , Rfl: 6 . multivitamin (TAB-A-VITE) tablet, Take by mouth., Disp: , Rfl:  . ondansetron (ZOFRAN) 8 MG tablet, once daily as needed., Disp: , Rfl:  . oxyCODONE (ROXICODONE) 5 MG immediate release tablet, Take 5-10 mg by mouth., Disp: , Rfl:  . sodium chloride 0.9 % SolP with pertuzumab 420 mg/14 mL (30 mg/mL) Solution, Infuse into the vein once., Disp: , Rfl:  . trastuzumab (HERCEPTIN) 440 mg injection, Infuse into the vein., Disp: , Rfl:  . zolpidem (AMBIEN) 5 MG tablet, Take by mouth., Disp: , Rfl:   A complete ROS was performed with pertinent positives/negatives noted in the HPI. The remainder of the ROS are negative.   Physical Exam:   BP 119/74 (Site: Right arm, Position: Sitting)  Pulse 111  Resp 16  Ht 1.702 m (5\' 7" )  Wt 74.8 kg (165 lb)  BMI 25.84 kg/m   General: Healthy and alert, in no distress, breathing easily. Normal affect. In a pleasant mood. Voice is raspy. Head: Normocephalic, atraumatic. No masses, or scars. Eyes: Pupils  are equal, and reactive to light. Vision is grossly intact. No spontaneous or gaze nystagmus. Ears: Ear canals are clear. Tympanic membranes are intact, with normal landmarks and the middle ears are clear and healthy. Hearing: Grossly normal. Nose: Nasal cavities are clear with healthy mucosa, no polyps or exudate. Airways are patent. Face: No masses or scars, facial nerve function is symmetric. Oral Cavity: No mucosal abnormalities are noted. Tongue with normal mobility. Dentition appears healthy. Oropharynx: Tonsils are symmetric. There are no mucosal masses identified. Tongue base  appears normal and healthy. Larynx/Hypopharynx: Inadequate to visualize the cords Chest: Deferred Neck: No palpable masses, no cervical adenopathy, no thyroid nodules or enlargement. Neuro: Cranial nerves II-XII with normal function. Balance: Normal gate. Other findings: none.  Independent Review of Additional Tests or Records:  none  Procedures:  Procedure note: Flexible fiberoptic laryngoscopy  Details of the procedure were explained to the patient and all questions were answered.   Procedure:   After anesthetizing the nasal cavity with topical lidocaine and oxymetazoline, the flexible endoscope was introduced and passed through the nasal cavity into the nasopharynx. The scope was then advanced to the level of the oropharynx, then the hypopharynx and larynx. Scope number 9 was used.  Findings:   The posterior soft palate, uvula, tongue base and vallecula were visualized and appeared healthy without mucosal masses or lesions. The epiglottis, aryepiglottic folds, hypopharynx, supraglottis, glottis were visualized and appeared healthy without mucosal masses or lesions. Vocal fold mobility was intact on the right with a paralysis in the paramedian position on the left.   Additional findings: Very dry nasal mucosal membranes.  The scope was withdrawn from the nose. She tolerated the procedure well.   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 We discussed that the internal branches of the laryngeal nerve can be traumatized by pressure from the intubation. She has regular imaging every several months and her last chest CT was read as negative. I will try to review these. Certainly neoplasm anywhere from the skull base to the upper chest can cause cord paralysis. Fortunately she is not having any aspiration symptoms and her voice is pretty good. No need for surgical intervention unless things get worse.

## 2018-03-15 DIAGNOSIS — R590 Localized enlarged lymph nodes: Secondary | ICD-10-CM | POA: Diagnosis not present

## 2018-03-15 DIAGNOSIS — Z5111 Encounter for antineoplastic chemotherapy: Secondary | ICD-10-CM | POA: Diagnosis not present

## 2018-03-15 DIAGNOSIS — C50412 Malignant neoplasm of upper-outer quadrant of left female breast: Secondary | ICD-10-CM | POA: Diagnosis not present

## 2018-03-15 DIAGNOSIS — Z9012 Acquired absence of left breast and nipple: Secondary | ICD-10-CM | POA: Diagnosis not present

## 2018-03-15 DIAGNOSIS — C792 Secondary malignant neoplasm of skin: Secondary | ICD-10-CM | POA: Diagnosis not present

## 2018-03-15 MED FILL — GABAPENTIN 300 MG CAPSULE: 300 | 30 days supply | Qty: 90 | Fill #2

## 2018-03-16 DIAGNOSIS — K819 Cholecystitis, unspecified: Secondary | ICD-10-CM | POA: Diagnosis not present

## 2018-03-16 DIAGNOSIS — I340 Nonrheumatic mitral (valve) insufficiency: Secondary | ICD-10-CM | POA: Diagnosis not present

## 2018-03-16 DIAGNOSIS — R197 Diarrhea, unspecified: Secondary | ICD-10-CM | POA: Diagnosis not present

## 2018-03-16 DIAGNOSIS — C771 Secondary and unspecified malignant neoplasm of intrathoracic lymph nodes: Secondary | ICD-10-CM | POA: Diagnosis not present

## 2018-03-16 DIAGNOSIS — G629 Polyneuropathy, unspecified: Secondary | ICD-10-CM | POA: Diagnosis not present

## 2018-03-16 DIAGNOSIS — Z5111 Encounter for antineoplastic chemotherapy: Secondary | ICD-10-CM | POA: Diagnosis not present

## 2018-03-16 DIAGNOSIS — C792 Secondary malignant neoplasm of skin: Secondary | ICD-10-CM | POA: Diagnosis not present

## 2018-03-16 DIAGNOSIS — L989 Disorder of the skin and subcutaneous tissue, unspecified: Secondary | ICD-10-CM | POA: Diagnosis not present

## 2018-03-16 DIAGNOSIS — Z5112 Encounter for antineoplastic immunotherapy: Secondary | ICD-10-CM | POA: Diagnosis not present

## 2018-03-16 DIAGNOSIS — C50412 Malignant neoplasm of upper-outer quadrant of left female breast: Secondary | ICD-10-CM | POA: Diagnosis not present

## 2018-03-17 MED FILL — DIPHENOXYLATE-ATROP 2.5-0.0: 2.5-0.025 | 30 days supply | Qty: 120 | Fill #0

## 2018-03-17 MED FILL — LOPERAMIDE 2 MG CAPSULE: 2 | 20 days supply | Qty: 120 | Fill #0

## 2018-03-24 NOTE — Pre-Procedure Instructions (Signed)
SOLEI WUBBEN  03/24/2018      [place], [place] - [address] [facility]. [address] [place] [address] Phone: [phone] Fax: [phone]    Your procedure is scheduled on March 31, 2018.  Report to [facility] Admitting at 9:40 AM.  Call this number if you have problems the morning of surgery:  [phone]   Remember:  Do not eat or drink after midnight.    Take these medicines the morning of surgery with A SIP OF WATER  Doxycycline (monodox) Gabapentin (neurontin) mirabegron ER (myrbetriq) Tylenol-if needed Lomotil-if needed Ondansetron (zofran) compazine -if needed for nausea  Follow your surgeon's instructions on when to hold/resume Eliquis.  If no instructions were given call the office to determine how they would like to you take Eliquis  7 days prior to surgery STOP taking any celebrex (celecoxib), Aspirin (unless otherwise instructed by your surgeon), Aleve, Naproxen, Ibuprofen, Motrin, Advil, Goody's, BC's, all herbal medications, fish oil, and all vitamins    Do not wear jewelry, make-up or nail polish.  Do not wear lotions, powders, or perfumes, or deodorant.  Do not shave 48 hours prior to surgery.    Do not bring valuables to the hospital.  [facility] is not responsible for any belongings or valuables.  Contacts, dentures or bridgework may not be worn into surgery.  Leave your suitcase in the car.  After surgery it may be brought to your room.  For patients admitted to the hospital, discharge time will be determined by your treatment team.  Patients discharged the day of surgery will not be allowed to drive home.    [place]- Preparing For Surgery  Before surgery, you can play an important role. Because skin is not sterile, your skin needs to be as free of germs as possible. You can reduce the number of germs on your skin by washing with CHG (chlorahexidine gluconate) Soap before surgery.   CHG is an antiseptic cleaner which kills germs and bonds with the skin to continue killing germs even after washing.    Oral Hygiene is also important to reduce your risk of infection.  Remember - BRUSH YOUR TEETH THE MORNING OF SURGERY WITH YOUR REGULAR TOOTHPASTE  Please do not use if you have an allergy to CHG or antibacterial soaps. If your skin becomes reddened/irritated stop using the CHG.  Do not shave (including legs and underarms) for at least 48 hours prior to first CHG shower. It is OK to shave your face.  Please follow these instructions carefully.   1. Shower the NIGHT BEFORE SURGERY and the MORNING OF SURGERY with CHG.   2. If you chose to wash your hair, wash your hair first as usual with your normal shampoo.  3. After you shampoo, rinse your hair and body thoroughly to remove the shampoo.  4. Use CHG as you would any other liquid soap. You can apply CHG directly to the skin and wash gently with a scrungie or a clean washcloth.   5. Apply the CHG Soap to your body ONLY FROM THE NECK DOWN.  Do not use on open wounds or open sores. Avoid contact with your eyes, ears, mouth and genitals (private parts). Wash Face and genitals (private parts)  with your normal soap.  6. Wash thoroughly, paying special attention to the area where your surgery will be performed.  7. Thoroughly rinse your body with warm water from the neck down.  8. DO NOT shower/wash with your normal soap after using and rinsing off the CHG Soap.  9. Pat yourself dry with a CLEAN TOWEL.  10. Wear CLEAN PAJAMAS to bed the night before surgery, wear comfortable clothes the morning of surgery  11. Place CLEAN SHEETS on your bed the night of your first shower and DO NOT SLEEP WITH PETS.  Day of Surgery:  Do not apply any deodorants/lotions.  Please wear clean clothes to the hospital/surgery center.   Remember to brush your teeth WITH YOUR REGULAR TOOTHPASTE.   Please read over the fact sheets that you were  given.

## 2018-03-24 NOTE — Progress Notes (Addendum)
PCP: Annye Asa, MD  Cardiologist: Glori Bickers, MD  EKG: 01/27/18 in EPIC  Stress test: denies  ECHO: 01/21/18 in EPIC  Cardiac Cath: 01/27/18 in EPIC  Chest x-ray: 10/16/17 in Epic  Pt on Eliquis-advised to hold beginning 03/28/18

## 2018-03-25 ENCOUNTER — Encounter (HOSPITAL_COMMUNITY)
Admission: RE | Admit: 2018-03-25 | Discharge: 2018-03-25 | Disposition: A | Discharge status: Home / Self Care | Payer: 59 | Source: Ambulatory Visit | Attending: Otolaryngology | Admitting: Otolaryngology

## 2018-03-25 ENCOUNTER — Other Ambulatory Visit: Payer: Self-pay

## 2018-03-25 ENCOUNTER — Encounter (HOSPITAL_COMMUNITY): Payer: Self-pay

## 2018-03-25 DIAGNOSIS — Z01812 Encounter for preprocedural laboratory examination: Secondary | ICD-10-CM | POA: Diagnosis not present

## 2018-03-25 LAB — CBC
HCT: 41.8 % (ref 36.0–46.0)
Hemoglobin: 13.3 g/dL (ref 12.0–15.0)
MCH: 31.3 pg (ref 26.0–34.0)
MCHC: 31.8 g/dL (ref 30.0–36.0)
MCV: 98.4 fL (ref 80.0–100.0)
NRBC: 0 % (ref 0.0–0.2)
PLATELETS: 158 10*3/uL (ref 150–400)
RBC: 4.25 MIL/uL (ref 3.87–5.11)
RDW: 14.8 % (ref 11.5–15.5)
WBC: 4.7 10*3/uL (ref 4.0–10.5)

## 2018-03-25 LAB — BASIC METABOLIC PANEL
ANION GAP: 7 (ref 5–15)
BUN: 18 mg/dL (ref 8–23)
CALCIUM: 8.9 mg/dL (ref 8.9–10.3)
CO2: 25 mmol/L (ref 22–32)
Chloride: 106 mmol/L (ref 98–111)
Creatinine, Ser: 0.69 mg/dL (ref 0.44–1.00)
Glucose, Bld: 82 mg/dL (ref 70–99)
Potassium: 4.1 mmol/L (ref 3.5–5.1)
Sodium: 138 mmol/L (ref 135–145)

## 2018-03-25 LAB — PROTIME-INR
INR: 1.54
Prothrombin Time: 18.3 seconds — ABNORMAL HIGH (ref 11.4–15.2)

## 2018-03-25 NOTE — Progress Notes (Addendum)
Anesthesia Chart Review:  Case:  [id number] Date/Time:  03/31/18 1125   Procedure:  LEFT VOCAL CORD MEDIALIZATION LARYNGOPLASTY (Left )   Anesthesia type:  General   Pre-op diagnosis:  Vocal Cord Paralyis   Location:  [place] OR ROOM 09 / [place] [facility]   Surgeon:  Izora Gala, MD    * She mentioned that Dr. Constance Holster discussed potential for doing case without general anesthesia. We discussed that case was posted for general anesthesia, but I would leave a message for Dr. Constance Holster for clarification. She is comfortable proceeding in which ever way is felt the best option for her. She was encouraged to speak with her assigned anesthesiologist on the day of surgery.    DISCUSSION: Patient is a 61 year old female scheduled for the above procedure.   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 She underwent right TKA 03/20/16 that became infected and required multiple procedures including skin graft split thickness to right knee wound on 01/29/17 and reimplantation of right TKA on 04/27/17. Chemotherapy had to be stopped for two months and she developed recurrent skin metastases. She was diagnosed with moderate pulmonary hypertension by [facility] following a CTA that showed enlarged pulmonary trunk without PE. She was started on sidenafil in 01/2018. According to 01/21/18 cardiology note, she has been "relatively asymptomatic" from her PAT. "Riding her  stationary bike 71mins/day. No CP. No SOB or dizziness."  Last seen Herceptin infusion date 03/16/18 (every 21 days) . Dr. Janeice Robinson office reaching out to [facility] (Dr. Derrill Center) regarding holding Eliquis beginning 03/28/18.   Discussed with anesthesiologist Renold Don, MD. Given PAH history, also recommend notifying Dr. Haroldine Laws of surgery plans which I did. He wrote to me, "The PAH is moderate. She is ok to proceed." I spoke with patient. No new cardiopulmonary symptoms since she last saw Dr. Haroldine Laws. She continued to ride on her stationary bike for about 25 minutes per day. She was told to take sildenafil on the day of surgery. If no acute changes then it is anticipated that she can proceed as planned.    VS: BP 117/60   Pulse 92   Temp 36.6 C   Resp 20   Ht 5\' 7"  (1.702 m)   Wt 79.5 kg   SpO2 97%   BMI 27.44 kg/m    PROVIDERS: Midge Minium, MD is PCP Glori Bickers, MD is Cardio-Oncology cardiologist [place], Ysidro Evert, DO is Oncologist ([place]). He is aware of ENT surgery plans and also encouraged her to see general surgery for potential cholecystectomy.   LABS: Labs reviewed: Repeat PT/INR of the day of surgery.  (all labs ordered are listed, but only abnormal results are displayed)  Labs Reviewed  PROTIME-INR - Abnormal; Notable for the following components:      Result Value   Prothrombin Time 18.3 (*)    All other components within normal limits  BASIC METABOLIC PANEL  CBC    IMAGES: Bone Scan 03/16/18 ([place]) Impression: 1. No definite scintigraphic evidence of skeletal metastatic disease. 2.  Likely post-surgical and degenerative or post-traumatic changes, as described above.  CT chest/abd/pelvis 03/15/18 ([place]): Impression: 1. Indistinctness of the gallbladder wall as well as pericholecystic fluid raises the possibility for cholecystitis the proper clinical setting. Correlate with physical exam and  symptomatology. 2. Redemonstrated multiple prominent lymph nodes in the abdomen and pelvis, unchanged from prior. 3. Postsurgical findings of prior left mastectomy and lymph node dissection without evidence of new metastatic disease in the chest, abdomen or pelvis.   CXR 10/16/17: IMPRESSION: 1. Interim removal of left PICC line. PowerPort catheter with tip in stable position. 2. Stable cardiomegaly. Stable retraction of the left hilum consistent with scarring. No acute cardiopulmonary disease.  CTA chest 05/07/17: IMPRESSION: 1. No evidence of central pulmonary embolism. 2. Mild dependent atelectasis in the lower lobes. No acute cardiopulmonary disease otherwise. Progressive scarring and bronchiectasis involving the left upper lobe, likely related to the prior radiation therapy at the time of breast cancer treatment. 3. Cardiomegaly with right atrial enlargement in particular. 4. Possible pulmonary arterial hypertension, as the main pulmonary trunk is mildly enlarged at 4.0 cm diameter. 5. Mild LAD coronary artery atherosclerosis. No visible aortic atherosclerosis. 6. Possible splenomegaly, though the spleen is incompletely imaged.   EKG: 01/27/18: NSR, possible left atrial enlargement.   CV: RHC 01/27/18: Findings: RA = 5 RV = 68/8 PA = 67/28 (45) PCW = 6 Fick cardiac output/index = 6.8/3.6 Thermo CO/CI = 7.4/4.0 PVR = 5.7 WU Ao sat = 98% PA sat = 79%, 79% High SVC sat = 79% Assessment: 1. Moderate PAH due to a combination of high output and pulmonary arteriopathy Plan/Discussion: Consider trial of selective vasodilators.    Echo 01/21/18: Study Conclusions - Left ventricle: The cavity size was normal. Wall thickness was   normal. Systolic function was normal. The estimated ejection   fraction was in the range of 60% to 65%. Doppler parameters are   consistent with abnormal left ventricular relaxation (grade 1   diastolic dysfunction). - Mitral valve: There was  mild regurgitation. - Right ventricle: The cavity size was mildly dilated. Wall   thickness was normal. - Tricuspid valve: There was moderate regurgitation. (PA peak pressure 65 mmHg 10/12/17 echo.)   Past Medical History:  Diagnosis Date  . Arthritis   . Breast cancer metastasized to skin, right [facility]) followed by dr force -- oncolgoist w/ [place]   primary left breast cancer dx 01/ 2009 ; 11/ 2009 recurrence left chest wall and neck, tx clinical trial drug and chemotherapy;  2015 recurrence cutaneous metastatized to right skin/ chest wall, 02/ 2015 resection chest wall disease and 02-23-2015 s/p skin flap surgery,  chemo every 3 weeks  . Chronic anticoagulation due to thromboembolic disorder   55/ 7322 - PAC clot--- ;  currently lovenox or eliquis  . Heart murmur   . 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 MVP (mitral valve prolapse)    mild mvp w/ mild regurg. per last echo 04-24-2017  . Neuropathy due to chemotherapeutic drug ([place])   . Non-healing surgical wound    right knee post re-implantation total knee arthroplasty 04-2017 unable to completely close surgical incision  . PONV (postoperative nausea and vomiting)    ponv likes  scopolamine patch  . Port-A-Cath in place    RIGHT CHEST   . Red blood cell antibody positive   . Skin changes related to chemotherapy    per patient she has scabbed over skin circumventing port to right upper chest ;  state it is due to chemptherapy   . 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 APPLICATION OF  A-CELL OF BACK Right 07/31/2016   Procedure: CELLERATE COLLAGEN PLACEMENT;  Surgeon: Loel Lofty Dillingham, DO;  Location: [place];  Service: Plastics;  Laterality: Right;  . APPLICATION OF WOUND VAC Right 06/29/2017   Procedure: APPLICATION OF WOUND VAC;  Surgeon: Wallace Going, DO;  Location: [facility];  Service: Plastics;  Laterality: Right;  . DEBRIDEMENT CHEST WALL RIGHT/ VAC PLACEMENT  02-09-2015    [facility]  . EXCISIONAL TOTAL KNEE ARTHROPLASTY WITH ANTIBIOTIC SPACERS Right 12/02/2016   Procedure: EXCISIONAL TOTAL KNEE ARTHROPLASTY WITH ANTIBIOTIC SPACERS;  Surgeon: Paralee Cancel, MD;  Location: [facility];  Service: Orthopedics;  Laterality: Right;  90 mins  . HEMATOMA EVACUATION Right 06/29/2017   Procedure: EVACUATION HEMATOMA OF RIGHT KNEE;  Surgeon: Wallace Going, DO;  Location: [facility];  Service: Plastics;  Laterality: Right;  . HEMATOMA EVACUATION Right 06/29/2017   Procedure: EVACUATION RIGHT TOTAL KNEE HEMATOMA;  Surgeon: Paralee Cancel, MD;  [facility];  Service: Orthopedics;  Laterality: Right;  . HEMATOMA EVACUATION Right 07/14/2017   Procedure: EVACUATION RIGHT LEG HEMATOMA WITH APPLICATION OF WOUND VAC;  Surgeon: Paralee Cancel, MD;  [facility];  Service: Orthopedics;  Laterality: Right;  . hemotoma     evacuation left chest wall  . I&D EXTREMITY Right 07/17/2017   Procedure: EVACUATION RIGHT LEG HEMATOMA WITH WOUND VAC DRESSING CHANGE;  Surgeon: Paralee Cancel, MD;  [facility];  Service: Orthopedics;  Laterality: Right;  . I&D KNEE WITH POLY EXCHANGE Right 05/28/2016   Procedure: IRRIGATION AND DEBRIDEMENT KNEE WOUND VAC PLACMENT;  Surgeon: Susa Day, MD;  [facility];  Service: Orthopedics;  Laterality: Right;  . I&D KNEE WITH POLY EXCHANGE Right 05/30/2016   Procedure: RADICAL SYNOVECTOMY,IRRIGATION AND DEBRIDEMENT KNEE WITH POLY EXCHANGE WITH ANTIBIOTIC BEADS, APPLICATION OF WOUND VAC;  Surgeon: Susa Day, MD;  [facility];  Service: Orthopedics;   Laterality: Right;  . INCISION AND DRAINAGE OF WOUND Right 07/31/2016   Procedure: IRRIGATION AND DEBRIDEMENT RIGHT KNEE  WOUND;  Surgeon: Loel Lofty Dillingham, DO;  Location: [place];  Service: Plastics;  Laterality: Right;  . IR FLUORO GUIDE CV LINE LEFT  04/30/2017  . IR US GUIDE VASC ACCESS LEFT  04/30/2017  . IRRIGATION AND DEBRIDEMENT KNEE Right 04/12/2016   Procedure: IRRIGATION AND DEBRIDEMENT KNEE;  Surgeon: Nicholes Stairs, MD;  [facility];  Service: Orthopedics;  Laterality: Right;  . IRRIGATION AND DEBRIDEMENT KNEE Right 12/16/2016   Procedure: Repeat irrigation and debridement right knee, wound closure  wound vac REPLACEMENT ANTIBIOTIC SPACERS;  Surgeon: Paralee Cancel, MD;  [facility];  Service: Orthopedics;  Laterality: Right;  . KNEE ARTHROSCOPY  02/13/2012   Procedure: ARTHROSCOPY KNEE;  Surgeon: Johnn Hai, MD;  Location[facility];  Service: Orthopedics;  Laterality: Left;  WITH DEBRIDEMENt   . KNEE ARTHROSCOPY WITH LATERAL MENISECTOMY  02/13/2012   Procedure: KNEE ARTHROSCOPY WITH LATERAL MENISECTOMY;  Surgeon:  Johnn Hai, MD;  Location: [facility];  Service: Orthopedics;;  partial  . LATISSIMUS FLAP RIGHT CHEST  02-23-2015   [facility]  . LEFT MODIFIED RADICAL MASTECTOMY/ RIGHT TOTAL MASTECTOMY  11-13-2007   LEFT BREAST CANCER W/ AXILLARY LYMPH NODE METASTASIS AND POST NEOADJUVANT CHEMO  . MASS EXCISION Right 06/29/2017   Procedure: EXCISION OF METASTATIC BREAST CANCER TO SKIN RIGHT SHOULDER, PLACEMENT OF CELLERATE;  Surgeon: Wallace Going, DO;  [facility];  Service: Plastics;  Laterality: Right;  . PLACEMENT PORT-A-CATH  06/22/2007    new port placed 2015, right chest  . REIMPLANTATION OF TOTAL KNEE Right 04/27/2017   Procedure: Reimplantation of right total knee arthroplasty;  Surgeon: Paralee Cancel, MD;  [facility];  Service: Orthopedics;  Laterality: Right;  Adductor Block  . RESECTION OF ISOLATED CHEST WALL  DISEASE/ ABDOMINAL SKIN FLAP  02/ 2015     [facility]   RIGHT CHEST WALL METASTATIC SKIN CARCINOMA  . RIGHT HEART CATH N/A 01/27/2018   Procedure: RIGHT HEART CATH;  Surgeon: Jolaine Artist, MD;  Location: [facility];  Service: Cardiovascular;  Laterality: N/A;  . SKIN SPLIT GRAFT Right 01/29/2017   Procedure: SKIN GRAFT SPLIT THICKNESS TO RIGHT KNEE WOUND;  Surgeon: Wallace Going, DO;  [facility];  Service: Plastics;  Laterality: Right;  . SKIN SPLIT GRAFT Right 09/07/2017   Procedure: SKIN GRAFT SPLIT THICKNESS TO RIGHT KNEE WOUND WITH PLACEMENT OF VAC DRESSING TO GRAFT SITE;  Surgeon: Wallace Going, DO;  Location: [place];  Service: Plastics;  Laterality: Right;  . TOTAL KNEE ARTHROPLASTY Left 09/23/2012   Procedure: LEFT TOTAL KNEE ARTHROPLASTY;  Surgeon: Johnn Hai, MD;  [facility];  Service: Orthopedics;  Laterality: Left;  . TOTAL KNEE ARTHROPLASTY Right 03/20/2016   Procedure: RIGHT TOTAL KNEE ARTHROPLASTY;  Surgeon: Susa Day, MD;  [facility];  Service: Orthopedics;  Laterality: Right;  . TRANSTHORACIC ECHOCARDIOGRAM  04/24/2017   ef 16-07%, grade 1 diastolic dysfuction/  mild MR with mild prolapse anterior leaflet/ mild TR/ PASP 75mmHg    MEDICATIONS: . acetaminophen (TYLENOL) 500 MG tablet  . apixaban (ELIQUIS) 5 MG TABS tablet  . celecoxib (CELEBREX) 200 MG capsule  . cetirizine (ZYRTEC) 10 MG tablet  . Cholecalciferol (VITAMIN D3) 2000 UNITS TABS  . diphenoxylate-atropine (LOMOTIL) 2.5-0.025 MG tablet  . doxycycline (MONODOX) 100 MG capsule  . gabapentin (NEURONTIN) 300 MG capsule  . lapatinib (TYKERB) 250 MG tablet  . mirabegron ER (MYRBETRIQ) 25 MG TB24 tablet  . ondansetron (ZOFRAN) 8 MG tablet  . prochlorperazine (COMPAZINE) 10 MG tablet  . sildenafil (REVATIO) 20 MG tablet  . Trastuzumab (HERCEPTIN IV)  . zolpidem (AMBIEN) 5 MG tablet   No current facility-administered medications for this encounter.      George Hugh [facility] Short Stay Center/Anesthesiology Phone [phone] 03/26/2018 3:14 PM

## 2018-03-26 MED FILL — TYKERB 250 MG TABLET: 250 | 28 days supply | Qty: 112 | Fill #1

## 2018-03-26 NOTE — Anesthesia Preprocedure Evaluation (Addendum)
Anesthesia Evaluation  Patient identified by MRN, date of birth, ID band Patient awake    Reviewed: Allergy & Precautions, NPO status , Patient's Chart, lab work & pertinent test results  History of Anesthesia Complications (+) PONV and history of anesthetic complications  Airway Mallampati: II  TM Distance: >3 FB     Dental no notable dental hx. (+) Teeth Intact, Caps   Pulmonary  Left vocal cord paralysis after multiple surgeries within the last year for right knee arthroplasty and complications   Pulmonary exam normal breath sounds clear to auscultation       Cardiovascular + Peripheral Vascular Disease and + DVT  Normal cardiovascular exam+ Valvular Problems/Murmurs  Rhythm:Regular Rate:Normal  Chronic anticoagulation for thromboembolic disorder   Neuro/Psych PSYCHIATRIC DISORDERS Anxiety negative neurological ROS     GI/Hepatic negative GI ROS, Neg liver ROS,   Endo/Other  Hx/o metastatic breast Ca  Renal/GU negative Renal ROS  negative genitourinary   Musculoskeletal  (+) Arthritis , Osteoarthritis,    Abdominal   Peds  Hematology  (+) anemia ,   Anesthesia Other Findings   Reproductive/Obstetrics                           Anesthesia Physical Anesthesia Plan  ASA: II  Anesthesia Plan: MAC   Post-op Pain Management:    Induction: Intravenous  PONV Risk Score and Plan: 4 or greater and Scopolamine patch - Pre-op, Midazolam, Dexamethasone and Ondansetron  Airway Management Planned: Natural Airway and Nasal Cannula  Additional Equipment:   Intra-op Plan:   Post-operative Plan:   Informed Consent: I have reviewed the patients History and Physical, chart, labs and discussed the procedure including the risks, benefits and alternatives for the proposed anesthesia with the patient or authorized representative who has indicated his/her understanding and acceptance.   Dental  advisory given  Plan Discussed with:   Anesthesia Plan Comments: (PAT note written 03/26/2018 by Myra Gianotti, PA-C. See comment regarding anesthesia options. She has moderate PAH.   )      Anesthesia Quick Evaluation

## 2018-03-29 DIAGNOSIS — N3281 Overactive bladder: Secondary | ICD-10-CM | POA: Diagnosis not present

## 2018-03-29 MED FILL — MYRBETRIQ ER 50 MG TABLET: 50 | 30 days supply | Qty: 30 | Fill #0

## 2018-03-31 ENCOUNTER — Observation Stay (HOSPITAL_COMMUNITY)
Admission: RE | Admit: 2018-03-31 | Discharge: 2018-04-01 | Disposition: A | Discharge status: Home / Self Care | Payer: 59 | Source: Ambulatory Visit | Attending: Otolaryngology | Admitting: Otolaryngology

## 2018-03-31 ENCOUNTER — Encounter (HOSPITAL_COMMUNITY)
Admission: RE | Disposition: A | Discharge status: Home / Self Care | Payer: Self-pay | Source: Ambulatory Visit | Attending: Otolaryngology

## 2018-03-31 ENCOUNTER — Encounter (HOSPITAL_COMMUNITY): Payer: Self-pay

## 2018-03-31 ENCOUNTER — Ambulatory Visit (HOSPITAL_COMMUNITY): Payer: 59 | Admitting: Vascular Surgery

## 2018-03-31 ENCOUNTER — Other Ambulatory Visit: Payer: Self-pay

## 2018-03-31 ENCOUNTER — Ambulatory Visit (HOSPITAL_COMMUNITY): Payer: 59 | Admitting: Certified Registered"

## 2018-03-31 DIAGNOSIS — Z791 Long term (current) use of non-steroidal anti-inflammatories (NSAID): Secondary | ICD-10-CM | POA: Insufficient documentation

## 2018-03-31 DIAGNOSIS — Z96653 Presence of artificial knee joint, bilateral: Secondary | ICD-10-CM | POA: Insufficient documentation

## 2018-03-31 DIAGNOSIS — Z79899 Other long term (current) drug therapy: Secondary | ICD-10-CM | POA: Insufficient documentation

## 2018-03-31 DIAGNOSIS — Z7901 Long term (current) use of anticoagulants: Secondary | ICD-10-CM | POA: Diagnosis not present

## 2018-03-31 DIAGNOSIS — R490 Dysphonia: Principal | ICD-10-CM | POA: Insufficient documentation

## 2018-03-31 DIAGNOSIS — Z9010 Acquired absence of unspecified breast and nipple: Secondary | ICD-10-CM | POA: Insufficient documentation

## 2018-03-31 DIAGNOSIS — J3800 Paralysis of vocal cords and larynx, unspecified: Secondary | ICD-10-CM | POA: Diagnosis not present

## 2018-03-31 DIAGNOSIS — Z86718 Personal history of other venous thrombosis and embolism: Secondary | ICD-10-CM | POA: Diagnosis not present

## 2018-03-31 DIAGNOSIS — I739 Peripheral vascular disease, unspecified: Secondary | ICD-10-CM | POA: Insufficient documentation

## 2018-03-31 DIAGNOSIS — Z853 Personal history of malignant neoplasm of breast: Secondary | ICD-10-CM | POA: Insufficient documentation

## 2018-03-31 DIAGNOSIS — Z79891 Long term (current) use of opiate analgesic: Secondary | ICD-10-CM | POA: Diagnosis not present

## 2018-03-31 LAB — PROTIME-INR
INR: 1.07
PROTHROMBIN TIME: 13.8 s (ref 11.4–15.2)

## 2018-03-31 SURGERY — MEDIALIZATION THYROPLASTY
Anesthesia: Monitor Anesthesia Care | Site: Mouth | Laterality: Left

## 2018-03-31 MED ORDER — DEXTROSE-NACL 5-0.9 % IV SOLN
INTRAVENOUS | Status: DC
  Administered 2018-03-31: 17:00:00 via INTRAVENOUS

## 2018-03-31 MED ORDER — MIDAZOLAM HCL 5 MG/5ML IJ SOLN
INTRAMUSCULAR | Status: DC | PRN
  Administered 2018-03-31: 1 mg via INTRAVENOUS

## 2018-03-31 MED ORDER — APIXABAN 5 MG PO TABS: 5.0000 mg | ORAL_TABLET | Freq: Two times a day (BID) | ORAL | Status: DC

## 2018-03-31 MED ORDER — LIDOCAINE 2% (20 MG/ML) 5 ML SYRINGE
INTRAMUSCULAR | Status: DC | PRN
  Administered 2018-03-31 (×2): 50 mg via INTRAVENOUS

## 2018-03-31 MED ORDER — APIXABAN 2.5 MG PO TABS
2.5000 mg | ORAL_TABLET | Freq: Two times a day (BID) | ORAL | Status: DC
  Filled 2018-03-31: qty 1

## 2018-03-31 MED ORDER — ONDANSETRON HCL 4 MG PO TABS: 8.0000 mg | ORAL_TABLET | Freq: Three times a day (TID) | ORAL | Status: DC | PRN

## 2018-03-31 MED ORDER — 0.9 % SODIUM CHLORIDE (POUR BTL) OPTIME
TOPICAL | Status: DC | PRN
  Administered 2018-03-31: 1000 mL

## 2018-03-31 MED ORDER — PROCHLORPERAZINE MALEATE 10 MG PO TABS
10.0000 mg | ORAL_TABLET | Freq: Four times a day (QID) | ORAL | Status: DC | PRN
  Filled 2018-03-31: qty 1

## 2018-03-31 MED ORDER — IBUPROFEN 100 MG/5ML PO SUSP
400.0000 mg | Freq: Four times a day (QID) | ORAL | Status: DC | PRN
  Administered 2018-03-31: 400 mg via ORAL
  Filled 2018-03-31 (×3): qty 20

## 2018-03-31 MED ORDER — FENTANYL CITRATE (PF) 250 MCG/5ML IJ SOLN
INTRAMUSCULAR | Status: AC
  Filled 2018-03-31: qty 5

## 2018-03-31 MED ORDER — MIDAZOLAM HCL 2 MG/2ML IJ SOLN
INTRAMUSCULAR | Status: AC
  Filled 2018-03-31: qty 2

## 2018-03-31 MED ORDER — DOXYCYCLINE HYCLATE 100 MG PO TABS
100.0000 mg | ORAL_TABLET | Freq: Two times a day (BID) | ORAL | Status: DC
  Administered 2018-03-31: 100 mg via ORAL
  Filled 2018-03-31 (×2): qty 1

## 2018-03-31 MED ORDER — LIDOCAINE-EPINEPHRINE (PF) 1 %-1:200000 IJ SOLN
INTRAMUSCULAR | Status: AC
  Filled 2018-03-31: qty 30

## 2018-03-31 MED ORDER — LAPATINIB DITOSYLATE 250 MG PO TABS: 1000.0000 mg | ORAL_TABLET | Freq: Every day | ORAL | Status: DC

## 2018-03-31 MED ORDER — CELECOXIB 200 MG PO CAPS: 200.0000 mg | ORAL_CAPSULE | Freq: Two times a day (BID) | ORAL | Status: DC | PRN

## 2018-03-31 MED ORDER — LACTATED RINGERS IV SOLN
INTRAVENOUS | Status: DC
  Administered 2018-03-31: 12:00:00 via INTRAVENOUS

## 2018-03-31 MED ORDER — VITAMIN D 25 MCG (1000 UNIT) PO TABS: 2000.0000 [IU] | ORAL_TABLET | ORAL | Status: DC

## 2018-03-31 MED ORDER — PROPOFOL 10 MG/ML IV BOLUS
INTRAVENOUS | Status: DC | PRN
  Administered 2018-03-31 (×2): 20 mg via INTRAVENOUS
  Administered 2018-03-31 (×2): 10 mg via INTRAVENOUS

## 2018-03-31 MED ORDER — OXYMETAZOLINE HCL 0.05 % NA SOLN
2.0000 | NASAL | Status: DC
  Filled 2018-03-31: qty 15

## 2018-03-31 MED ORDER — SILDENAFIL CITRATE 20 MG PO TABS
20.0000 mg | ORAL_TABLET | Freq: Two times a day (BID) | ORAL | Status: DC
  Administered 2018-03-31: 20 mg via ORAL
  Filled 2018-03-31 (×2): qty 1

## 2018-03-31 MED ORDER — MIRABEGRON ER 25 MG PO TB24
25.0000 mg | ORAL_TABLET | Freq: Every day | ORAL | Status: DC
  Filled 2018-03-31: qty 1

## 2018-03-31 MED ORDER — ACETAMINOPHEN 500 MG PO TABS: 500.0000 mg | ORAL_TABLET | Freq: Three times a day (TID) | ORAL | Status: DC | PRN

## 2018-03-31 MED ORDER — LIDOCAINE-EPINEPHRINE (PF) 1 %-1:200000 IJ SOLN
INTRAMUSCULAR | Status: DC | PRN
  Administered 2018-03-31: 6 mL

## 2018-03-31 MED ORDER — DIPHENOXYLATE-ATROPINE 2.5-0.025 MG PO TABS: 1.0000 | ORAL_TABLET | Freq: Four times a day (QID) | ORAL | Status: DC | PRN

## 2018-03-31 MED ORDER — BACITRACIN ZINC 500 UNIT/GM EX OINT
TOPICAL_OINTMENT | CUTANEOUS | Status: AC
  Filled 2018-03-31: qty 28.35

## 2018-03-31 MED ORDER — ZOLPIDEM TARTRATE 5 MG PO TABS: 5.0000 mg | ORAL_TABLET | Freq: Every evening | ORAL | Status: DC | PRN

## 2018-03-31 MED ORDER — ONDANSETRON HCL 4 MG/2ML IJ SOLN
INTRAMUSCULAR | Status: DC | PRN
  Administered 2018-03-31: 4 mg via INTRAVENOUS

## 2018-03-31 MED ORDER — HYDROCODONE-ACETAMINOPHEN 5-325 MG PO TABS
1.0000 | ORAL_TABLET | ORAL | Status: DC | PRN
  Administered 2018-03-31: 1 via ORAL
  Filled 2018-03-31: qty 1

## 2018-03-31 MED ORDER — GABAPENTIN 300 MG PO CAPS
300.0000 mg | ORAL_CAPSULE | Freq: Three times a day (TID) | ORAL | Status: DC
  Administered 2018-03-31 (×2): 300 mg via ORAL
  Filled 2018-03-31 (×3): qty 1

## 2018-03-31 MED ORDER — OXYMETAZOLINE HCL 0.05 % NA SOLN
NASAL | Status: AC
  Filled 2018-03-31: qty 15

## 2018-03-31 MED ORDER — PROPOFOL 500 MG/50ML IV EMUL
INTRAVENOUS | Status: DC | PRN
  Administered 2018-03-31: 50 ug/kg/min via INTRAVENOUS

## 2018-03-31 MED ORDER — LIDOCAINE-EPINEPHRINE 1 %-1:100000 IJ SOLN: INTRAMUSCULAR | Status: DC | PRN

## 2018-03-31 SURGICAL SUPPLY — 40 items
ADH SKN CLS APL DERMABOND .7 (GAUZE/BANDAGES/DRESSINGS) ×1
BLADE SURG 10 STRL SS (BLADE) ×2 IMPLANT
BLADE SURG 15 STRL LF DISP TIS (BLADE) IMPLANT
BLADE SURG 15 STRL SS (BLADE)
BLOCK SILICONE 1.91X1.91 (Miscellaneous) ×1 IMPLANT
BUR CROSS CUT FISSURE 1.6 (BURR) ×2 IMPLANT
CANISTER SUCT 3000ML PPV (MISCELLANEOUS) ×2 IMPLANT
CLEANER TIP ELECTROSURG 2X2 (MISCELLANEOUS) ×2 IMPLANT
CORDS BIPOLAR (ELECTRODE) ×2 IMPLANT
COVER MAYO STAND STRL (DRAPES) ×1 IMPLANT
COVER SURGICAL LIGHT HANDLE (MISCELLANEOUS) ×2 IMPLANT
COVER WAND RF STERILE (DRAPES) ×2 IMPLANT
DERMABOND ADVANCED (GAUZE/BANDAGES/DRESSINGS) ×1
DERMABOND ADVANCED .7 DNX12 (GAUZE/BANDAGES/DRESSINGS) ×1 IMPLANT
DRAPE HALF SHEET 40X57 (DRAPES) ×1 IMPLANT
ELECT COATED BLADE 2.86 ST (ELECTRODE) ×2 IMPLANT
ELECT REM PT RETURN 9FT ADLT (ELECTROSURGICAL) ×2
ELECTRODE REM PT RTRN 9FT ADLT (ELECTROSURGICAL) ×1 IMPLANT
FORCEPS BIPOLAR SPETZLER 8 1.0 (NEUROSURGERY SUPPLIES) ×2 IMPLANT
GAUZE SPONGE 2X2 8PLY STRL LF (GAUZE/BANDAGES/DRESSINGS) IMPLANT
GAUZE SPONGE 4X4 12PLY STRL (GAUZE/BANDAGES/DRESSINGS) IMPLANT
GLOVE ECLIPSE 7.5 STRL STRAW (GLOVE) ×2 IMPLANT
GOWN STRL REUS W/ TWL LRG LVL3 (GOWN DISPOSABLE) ×2 IMPLANT
GOWN STRL REUS W/TWL LRG LVL3 (GOWN DISPOSABLE) ×4
KIT BASIN OR (CUSTOM PROCEDURE TRAY) ×2 IMPLANT
KIT TURNOVER KIT B (KITS) ×2 IMPLANT
NEEDLE PRECISIONGLIDE 27X1.5 (NEEDLE) ×2 IMPLANT
NS IRRIG 1000ML POUR BTL (IV SOLUTION) ×2 IMPLANT
PAD ARMBOARD 7.5X6 YLW CONV (MISCELLANEOUS) ×4 IMPLANT
PATTIES SURGICAL .5 X1 (DISPOSABLE) IMPLANT
PENCIL FOOT CONTROL (ELECTRODE) ×2 IMPLANT
RUBBERBAND STERILE (MISCELLANEOUS) ×2 IMPLANT
SPONGE GAUZE 2X2 STER 10/PKG (GAUZE/BANDAGES/DRESSINGS) ×1
SUT CHROMIC 3 0 PS 2 (SUTURE) ×2 IMPLANT
SUT CHROMIC 3 0 SH 27 (SUTURE) IMPLANT
SUT SILK 4 0 TIE 10X30 (SUTURE) ×1 IMPLANT
SUT VIC AB 3-0 SH 27 (SUTURE)
SUT VIC AB 3-0 SH 27XBRD (SUTURE) ×1 IMPLANT
TOWEL OR 17X24 6PK STRL BLUE (TOWEL DISPOSABLE) ×2 IMPLANT
TRAY ENT MC OR (CUSTOM PROCEDURE TRAY) ×2 IMPLANT

## 2018-03-31 NOTE — Progress Notes (Signed)
ENT Post Operative Note  Subjective: No nausea, vomiting No difficulty voiding Pain partially controlled  Vitals:   03/31/18 1511 03/31/18 1526  BP: 135/79 136/82  Pulse: 97 96  Resp: 13 18  Temp:  (!) 97.5 F (36.4 C)  SpO2: 94% 95%     OBJECTIVE  Gen: alert, cooperative, appropriate Head/ENT: EOMI, neck supple, mucus membranes moist and pink, conjunctiva clear Incisions c/d/i Face moves symmetrically Respiratory: Voice without dysphonia. non-labored breathing, no accessory muscle use, normal HR, good O2 saturations    ASSESS/ PLAN  [name] is a 61 y.o. female who is POD 0 from vocal cord medialization.  -pain control -MIVF- will saline lock when tolerating PO intake -wound care, routine -Stays tonight  Helayne Seminole, MD

## 2018-03-31 NOTE — Progress Notes (Signed)
Pt has open ulcers on right upper chest near shoulder, covered by dressing.  Pt states its from her breast cancer.

## 2018-03-31 NOTE — Anesthesia Postprocedure Evaluation (Signed)
Anesthesia Post Note  Patient: [name]  Procedure(s) Performed: LEFT VOCAL CORD MEDIALIZATION LARYNGOPLASTY (Left Mouth)     Patient location during evaluation: PACU Anesthesia Type: MAC Level of consciousness: awake and alert and oriented Pain management: pain level controlled Vital Signs Assessment: post-procedure vital signs reviewed and stable Respiratory status: spontaneous breathing, nonlabored ventilation and respiratory function stable Cardiovascular status: stable and blood pressure returned to baseline Postop Assessment: no apparent nausea or vomiting Anesthetic complications: no    Last Vitals:  Vitals:   03/31/18 1116 03/31/18 1426  BP: 125/65 137/75  Pulse: 95 98  Resp: 18 15  Temp: 37.1 C (!) 36.2 C  SpO2: 99% 97%    Last Pain:  Vitals:   03/31/18 1426  TempSrc:   PainSc: 0-No pain                 Audreyana Huntsberry A.

## 2018-03-31 NOTE — Op Note (Signed)
OPERATIVE REPORT  DATE OF SURGERY: 03/31/2018  PATIENT:  [name],  62 y.o. female  PRE-OPERATIVE DIAGNOSIS:  Vocal Cord Paralyis  POST-OPERATIVE DIAGNOSIS:  Vocal Cord Paralyis  PROCEDURE:  Procedure(s): LEFT VOCAL CORD MEDIALIZATION LARYNGOPLASTY  SURGEON:  Beckie Salts, MD  ASSISTANTS: None  ANESTHESIA:   General   EBL: 30 ml  DRAINS: Sterile rubber band  LOCAL MEDICATIONS USED: 1% Xylocaine with epinephrine  SPECIMEN:  none  COUNTS:  Correct  PROCEDURE DETAILS: The patient was taken to the operating room and placed on the operating table in the supine position. Following induction of intravenous sedation anesthesia, the flexible fiberoptic scope was passed into the left nasal cavity and used to view the larynx.  This was secured to the [place] stand above the patient's head.  The neck was prepped and draped in the standard fashion.  A transverse incision was outlined with a marking pen to the left of midline at the level of the mid thyroid cartilage.  Local anesthetic was infiltrated.  #15 scalpel was used to incise the skin and subcutaneous tissue.  Cautery was used for hemostasis.  The strap muscles were reflected laterally off the anterior aspect of the thyroid cartilage the periosteum was dissected off posteriorly.  A window was outlined with cautery about 3 mm above the inferior margin, about 4 mm lateral to the midline, and about 5 mm high, 10 to 12 mm in length.  The cartilage was not ossified so a scalpel was used to create the cartilaginous window.  The inner periosteum was dissected off the undersurface.  Using direct visualization under the fiberoptic scope, the position of the cord was identified and the Silastic block was cut to size and shape.  This was then inserted into the cartilage window and secured in place.  She has much stronger voice and good stability of the implant.  The wound was irrigated with saline.  Wound was closed in layers using interrupted 3-0  chromic suture on the deep layer and running subcuticular on the skin.  A rubber band drain was left in the wound exited through the left side of the incision.  Dermabond was used on the skin.  Dressing was applied.  The fiberoptic scope was removed.  Patient was then transferred to recovery in stable condition.[name]    PATIENT DISPOSITION:  To PACU, stable

## 2018-03-31 NOTE — Transfer of Care (Signed)
Immediate Anesthesia Transfer of Care Note  Patient: [name]  Procedure(s) Performed: LEFT VOCAL CORD MEDIALIZATION LARYNGOPLASTY (Left Mouth)  Patient Location: PACU  Anesthesia Type:MAC  Level of Consciousness: awake, alert  and oriented  Airway & Oxygen Therapy: Patient Spontanous Breathing  Post-op Assessment: Report given to RN and Post -op Vital signs reviewed and stable  Post vital signs: Reviewed and stable  Last Vitals:  Vitals Value Taken Time  BP 137/75 03/31/2018  2:26 PM  Temp    Pulse 97 03/31/2018  2:28 PM  Resp 23 03/31/2018  2:28 PM  SpO2 97 % 03/31/2018  2:28 PM  Vitals shown include unvalidated device data.  Last Pain:  Vitals:   03/31/18 1150  TempSrc:   PainSc: 0-No pain         Complications: No apparent anesthesia complications

## 2018-03-31 NOTE — Progress Notes (Signed)
Received patient from PACU. Patient alert oriented x4. Dressing clean, dry and intact. Complaining of a little pain at this time but does not want pain medication. Ambulated to the bathroom. Will continue to monitor.  Patient states she does not have her chemo drug with her at the hospital and plans to take it tomorrow morning when she returns home. Pharmacy notified.

## 2018-03-31 NOTE — Interval H&P Note (Signed)
History and Physical Interval Note:  03/31/2018 12:39 PM  [name]  has presented today for surgery, with the diagnosis of Vocal Cord Paralyis  The various methods of treatment have been discussed with the patient and family. After consideration of risks, benefits and other options for treatment, the patient has consented to  Procedure(s): LEFT VOCAL CORD MEDIALIZATION LARYNGOPLASTY (Left) as a surgical intervention .  The patient's history has been reviewed, patient examined, no change in status, stable for surgery.  I have reviewed the patient's chart and labs.  Questions were answered to the patient's satisfaction.     Izora Gala

## 2018-03-31 NOTE — Progress Notes (Signed)
Rubber band used as a drain for patient in neck Left side

## 2018-04-01 DIAGNOSIS — I739 Peripheral vascular disease, unspecified: Secondary | ICD-10-CM | POA: Diagnosis not present

## 2018-04-01 DIAGNOSIS — Z9010 Acquired absence of unspecified breast and nipple: Secondary | ICD-10-CM | POA: Diagnosis not present

## 2018-04-01 DIAGNOSIS — J3800 Paralysis of vocal cords and larynx, unspecified: Secondary | ICD-10-CM | POA: Diagnosis not present

## 2018-04-01 DIAGNOSIS — Z86718 Personal history of other venous thrombosis and embolism: Secondary | ICD-10-CM | POA: Diagnosis not present

## 2018-04-01 DIAGNOSIS — R490 Dysphonia: Secondary | ICD-10-CM | POA: Diagnosis not present

## 2018-04-01 DIAGNOSIS — Z7901 Long term (current) use of anticoagulants: Secondary | ICD-10-CM | POA: Diagnosis not present

## 2018-04-01 DIAGNOSIS — Z96653 Presence of artificial knee joint, bilateral: Secondary | ICD-10-CM | POA: Diagnosis not present

## 2018-04-01 DIAGNOSIS — Z79899 Other long term (current) drug therapy: Secondary | ICD-10-CM | POA: Diagnosis not present

## 2018-04-01 DIAGNOSIS — Z79891 Long term (current) use of opiate analgesic: Secondary | ICD-10-CM | POA: Diagnosis not present

## 2018-04-01 NOTE — Discharge Summary (Signed)
Physician Discharge Summary  Patient ID: TAILYN HANTZ MRN: [id number] DOB/AGE: Jun 08, 1955 62 y.o.  Admit date: 03/31/2018 Discharge date: 04/01/2018  Admission Diagnoses: Vocal cord paralysis  Discharge Diagnoses:  Active Problems:   Vocal cord paralysis   Discharged Condition: good  Hospital Course: No complications  Consults: none  Significant Diagnostic Studies: none  Treatments: surgery: Vocal cord medialization laryngoplasty  Discharge Exam: Blood pressure 116/69, pulse 87, temperature 98.3 F (36.8 C), temperature source Oral, resp. rate 17, height 5\' 7"  (1.702 m), weight 79.5 kg, SpO2 97 %. PHYSICAL EXAM: Awake and alert, neck looks excellent.  Drain removed.  Voice is excellent.  No swelling.  Disposition: Discharge disposition: 01-Home or Self Care       Discharge Instructions    Diet - low sodium heart healthy   Complete by:  As directed    Increase activity slowly   Complete by:  As directed      Allergies as of 04/01/2018      Reactions   Penicillins Hives, Itching, Rash, Other (See Comments)   PATIENT HAS HAD A PCN REACTION WITH IMMEDIATE RASH, FACIAL/TONGUE/THROAT SWELLING, SOB, OR LIGHTHEADEDNESS WITH HYPOTENSION:  #  #  YES  #  #  Has patient had a PCN reaction causing severe rash involving mucus membranes or skin necrosis: No Has patient had a PCN reaction that required hospitalization No Has patient had a PCN reaction occurring within the last 10 years: yes Denies airway involvement   Tape Hives, Other (See Comments)   Can tolerate paper and adhesive,  NO MEDIPORE TAPE   Other Rash, Other (See Comments)   STERI STRIPS - Blisters      Medication List    TAKE these medications   acetaminophen 500 MG tablet Commonly known as:  TYLENOL Take 500 mg by mouth every 8 (eight) hours as needed for mild pain or headache.   celecoxib 200 MG capsule Commonly known as:  CELEBREX Take 200 mg by mouth 2 (two) times daily as needed for moderate  pain.   cetirizine 10 MG tablet Commonly known as:  ZYRTEC Take 1 tablet (10 mg total) daily by mouth.   diphenoxylate-atropine 2.5-0.025 MG tablet Commonly known as:  LOMOTIL Take 1 tablet by mouth 4 (four) times daily as needed for diarrhea or loose stools.   doxycycline 100 MG capsule Commonly known as:  MONODOX Take 100 mg by mouth 2 (two) times daily.   ELIQUIS 5 MG Tabs tablet Generic drug:  apixaban Take 5 mg by mouth 2 (two) times daily.   gabapentin 300 MG capsule Commonly known as:  NEURONTIN Take 1 capsule (300 mg total) by mouth 3 (three) times daily.   HERCEPTIN IV Inject 1 Syringe into the vein every 21 ( twenty-one) days.   lapatinib 250 MG tablet Commonly known as:  TYKERB Take 1,000 mg by mouth daily. Take on an empty stomach, at least 1 hour before or 1 hour after meals.   mirabegron ER 25 MG Tb24 tablet Commonly known as:  MYRBETRIQ Take 1 tablet (25 mg total) by mouth daily.   ondansetron 8 MG tablet Commonly known as:  ZOFRAN TAKE 1 TABLET BY MOUTH EVERY 8 HOURS AS NEEDED FOR NAUSEA. What changed:    reasons to take this  additional instructions   prochlorperazine 10 MG tablet Commonly known as:  COMPAZINE Take 10 mg by mouth every 6 (six) hours as needed for nausea or vomiting.   sildenafil 20 MG tablet Commonly known as:  REVATIO  Take 1 tablet (20 mg total) by mouth 3 (three) times daily. What changed:  when to take this   Vitamin D3 50 MCG (2000 UT) Tabs Take 2,000 Units by mouth 4 (four) times a week.   zolpidem 5 MG tablet Commonly known as:  AMBIEN TAKE 1 TABLET BY MOUTH ONCE DAILY AT BEDTIME AS NEEDED FOR SLEEP What changed:    how much to take  how to take this  when to take this  reasons to take this  additional instructions      Follow-up Information    Izora Gala, MD. Schedule an appointment as soon as possible for a visit in 1 week.   Specialty:  Otolaryngology Contact information: [address] [place] [address] [phone]           Signed: Izora Gala 04/01/2018, 7:56 AM

## 2018-04-01 NOTE — Progress Notes (Signed)
[name] to be D/C'd  per MD order. Discussed with the patient and all questions fully answered.  VSS, Skin clean, dry and intact without evidence of skin break down, no evidence of skin tears noted.  IV catheter discontinued intact. Site without signs and symptoms of complications. Dressing and pressure applied.  An After Visit Summary was printed and given to the patient. Patient received prescription.  D/c education completed with patient/family including follow up instructions, medication list, d/c activities limitations if indicated, with other d/c instructions as indicated by MD - patient able to verbalize understanding, all questions fully answered.   Patient instructed to return to ED, call 911, or call MD for any changes in condition.   Patient to be escorted via [place], and D/C home via private auto.  [place] Lanae Boast, RN

## 2018-04-01 NOTE — Plan of Care (Signed)
  Problem: Education: Goal: Knowledge of General Education information will improve Description Including pain rating scale, medication(s)/side effects and non-pharmacologic comfort measures Outcome: Progressing Note:  POC and pain management reviewed with pt.   

## 2018-04-01 NOTE — Discharge Instructions (Signed)
You may shower and use soap and water. Do not use any creams, oils or ointment. ° °

## 2018-04-02 ENCOUNTER — Encounter: Payer: Self-pay | Admitting: Family Medicine

## 2018-04-02 MED ORDER — APIXABAN 5 MG PO TABS: 5.0000 mg | ORAL_TABLET | Freq: Two times a day (BID) | ORAL | 6 refills | Status: DC

## 2018-04-02 MED FILL — ELIQUIS 5 MG TABLET: 5 | 30 days supply | Qty: 60 | Fill #0

## 2018-04-02 MED FILL — DOXYCYCLINE MONO 100 MG CAP: 100 | 30 days supply | Qty: 60 | Fill #2

## 2018-04-02 NOTE — Telephone Encounter (Signed)
Please advise this is listed as a historical med.

## 2018-04-05 DIAGNOSIS — N3941 Urge incontinence: Secondary | ICD-10-CM | POA: Diagnosis not present

## 2018-04-05 DIAGNOSIS — M6281 Muscle weakness (generalized): Secondary | ICD-10-CM | POA: Diagnosis not present

## 2018-04-05 DIAGNOSIS — M6289 Other specified disorders of muscle: Secondary | ICD-10-CM | POA: Diagnosis not present

## 2018-04-06 DIAGNOSIS — C50412 Malignant neoplasm of upper-outer quadrant of left female breast: Secondary | ICD-10-CM | POA: Diagnosis not present

## 2018-04-06 DIAGNOSIS — Z5112 Encounter for antineoplastic immunotherapy: Secondary | ICD-10-CM | POA: Diagnosis not present

## 2018-04-06 DIAGNOSIS — C792 Secondary malignant neoplasm of skin: Secondary | ICD-10-CM | POA: Diagnosis not present

## 2018-04-07 DIAGNOSIS — M6281 Muscle weakness (generalized): Secondary | ICD-10-CM | POA: Diagnosis not present

## 2018-04-07 DIAGNOSIS — M6289 Other specified disorders of muscle: Secondary | ICD-10-CM | POA: Diagnosis not present

## 2018-04-07 DIAGNOSIS — N3281 Overactive bladder: Secondary | ICD-10-CM | POA: Diagnosis not present

## 2018-04-07 DIAGNOSIS — N3941 Urge incontinence: Secondary | ICD-10-CM | POA: Diagnosis not present

## 2018-04-12 MED FILL — GABAPENTIN 300 MG CAPSULE: 300 | 30 days supply | Qty: 90 | Fill #3

## 2018-04-15 ENCOUNTER — Encounter: Payer: Self-pay | Admitting: Family Medicine

## 2018-04-15 DIAGNOSIS — M6289 Other specified disorders of muscle: Secondary | ICD-10-CM | POA: Diagnosis not present

## 2018-04-15 DIAGNOSIS — K829 Disease of gallbladder, unspecified: Secondary | ICD-10-CM

## 2018-04-15 DIAGNOSIS — M6281 Muscle weakness (generalized): Secondary | ICD-10-CM | POA: Diagnosis not present

## 2018-04-15 DIAGNOSIS — N3941 Urge incontinence: Secondary | ICD-10-CM | POA: Diagnosis not present

## 2018-04-26 MED FILL — TYKERB 250 MG TABLET: 250 | 28 days supply | Qty: 112 | Fill #2

## 2018-04-27 DIAGNOSIS — M2550 Pain in unspecified joint: Secondary | ICD-10-CM | POA: Diagnosis not present

## 2018-04-27 DIAGNOSIS — C50412 Malignant neoplasm of upper-outer quadrant of left female breast: Secondary | ICD-10-CM | POA: Diagnosis not present

## 2018-04-27 DIAGNOSIS — Z5112 Encounter for antineoplastic immunotherapy: Secondary | ICD-10-CM | POA: Diagnosis not present

## 2018-04-27 DIAGNOSIS — Z171 Estrogen receptor negative status [ER-]: Secondary | ICD-10-CM | POA: Diagnosis not present

## 2018-04-27 DIAGNOSIS — Z5111 Encounter for antineoplastic chemotherapy: Secondary | ICD-10-CM | POA: Diagnosis not present

## 2018-04-27 DIAGNOSIS — C7982 Secondary malignant neoplasm of genital organs: Secondary | ICD-10-CM | POA: Diagnosis not present

## 2018-04-27 DIAGNOSIS — M7989 Other specified soft tissue disorders: Secondary | ICD-10-CM | POA: Diagnosis not present

## 2018-04-27 DIAGNOSIS — C771 Secondary and unspecified malignant neoplasm of intrathoracic lymph nodes: Secondary | ICD-10-CM | POA: Diagnosis not present

## 2018-04-27 DIAGNOSIS — G629 Polyneuropathy, unspecified: Secondary | ICD-10-CM | POA: Diagnosis not present

## 2018-04-27 DIAGNOSIS — L989 Disorder of the skin and subcutaneous tissue, unspecified: Secondary | ICD-10-CM | POA: Diagnosis not present

## 2018-04-27 DIAGNOSIS — R748 Abnormal levels of other serum enzymes: Secondary | ICD-10-CM | POA: Diagnosis not present

## 2018-04-28 DIAGNOSIS — Z96651 Presence of right artificial knee joint: Secondary | ICD-10-CM | POA: Diagnosis not present

## 2018-04-28 DIAGNOSIS — Z471 Aftercare following joint replacement surgery: Secondary | ICD-10-CM | POA: Diagnosis not present

## 2018-05-03 MED FILL — MYRBETRIQ ER 50 MG TABLET: 50 | 30 days supply | Qty: 30 | Fill #1

## 2018-05-03 MED FILL — ELIQUIS 5 MG TABLET: 5 | 30 days supply | Qty: 60 | Fill #1

## 2018-05-03 MED FILL — DOXYCYCLINE MONO 100 MG CAP: 100 | 30 days supply | Qty: 60 | Fill #3

## 2018-05-04 DIAGNOSIS — M6281 Muscle weakness (generalized): Secondary | ICD-10-CM | POA: Diagnosis not present

## 2018-05-04 DIAGNOSIS — M6289 Other specified disorders of muscle: Secondary | ICD-10-CM | POA: Diagnosis not present

## 2018-05-04 DIAGNOSIS — N3941 Urge incontinence: Secondary | ICD-10-CM | POA: Diagnosis not present

## 2018-05-04 DIAGNOSIS — M62838 Other muscle spasm: Secondary | ICD-10-CM | POA: Diagnosis not present

## 2018-05-10 MED FILL — GABAPENTIN 300 MG CAPSULE: 300 | 30 days supply | Qty: 90 | Fill #4

## 2018-05-11 DIAGNOSIS — M6281 Muscle weakness (generalized): Secondary | ICD-10-CM | POA: Diagnosis not present

## 2018-05-11 DIAGNOSIS — M62838 Other muscle spasm: Secondary | ICD-10-CM | POA: Diagnosis not present

## 2018-05-11 DIAGNOSIS — M6289 Other specified disorders of muscle: Secondary | ICD-10-CM | POA: Diagnosis not present

## 2018-05-11 DIAGNOSIS — R3915 Urgency of urination: Secondary | ICD-10-CM | POA: Diagnosis not present

## 2018-05-11 DIAGNOSIS — N3941 Urge incontinence: Secondary | ICD-10-CM | POA: Diagnosis not present

## 2018-05-14 ENCOUNTER — Ambulatory Visit (HOSPITAL_BASED_OUTPATIENT_CLINIC_OR_DEPARTMENT_OTHER)
Admission: RE | Admit: 2018-05-14 | Discharge: 2018-05-14 | Disposition: A | Discharge status: Home / Self Care | Payer: 59 | Source: Ambulatory Visit | Attending: Internal Medicine | Admitting: Internal Medicine

## 2018-05-14 ENCOUNTER — Ambulatory Visit (HOSPITAL_COMMUNITY)
Admission: RE | Admit: 2018-05-14 | Discharge: 2018-05-14 | Disposition: A | Discharge status: Home / Self Care | Payer: 59 | Source: Ambulatory Visit | Attending: Internal Medicine | Admitting: Internal Medicine

## 2018-05-14 ENCOUNTER — Encounter (HOSPITAL_COMMUNITY): Payer: Self-pay | Admitting: Internal Medicine

## 2018-05-14 VITALS — BP 136/88 | HR 99 | Wt 181.0 lb

## 2018-05-14 DIAGNOSIS — I2721 Secondary pulmonary arterial hypertension: Secondary | ICD-10-CM

## 2018-05-14 DIAGNOSIS — I272 Pulmonary hypertension, unspecified: Secondary | ICD-10-CM

## 2018-05-14 DIAGNOSIS — Z803 Family history of malignant neoplasm of breast: Secondary | ICD-10-CM | POA: Insufficient documentation

## 2018-05-14 DIAGNOSIS — Z96651 Presence of right artificial knee joint: Secondary | ICD-10-CM | POA: Diagnosis not present

## 2018-05-14 DIAGNOSIS — M1990 Unspecified osteoarthritis, unspecified site: Secondary | ICD-10-CM | POA: Diagnosis not present

## 2018-05-14 DIAGNOSIS — Z880 Allergy status to penicillin: Secondary | ICD-10-CM | POA: Diagnosis not present

## 2018-05-14 DIAGNOSIS — Z9013 Acquired absence of bilateral breasts and nipples: Secondary | ICD-10-CM | POA: Insufficient documentation

## 2018-05-14 DIAGNOSIS — K766 Portal hypertension: Secondary | ICD-10-CM

## 2018-05-14 DIAGNOSIS — C792 Secondary malignant neoplasm of skin: Secondary | ICD-10-CM | POA: Insufficient documentation

## 2018-05-14 DIAGNOSIS — Z888 Allergy status to other drugs, medicaments and biological substances status: Secondary | ICD-10-CM | POA: Diagnosis not present

## 2018-05-14 DIAGNOSIS — C50919 Malignant neoplasm of unspecified site of unspecified female breast: Secondary | ICD-10-CM

## 2018-05-14 DIAGNOSIS — Z79899 Other long term (current) drug therapy: Secondary | ICD-10-CM | POA: Diagnosis not present

## 2018-05-14 MED ORDER — SILDENAFIL CITRATE 20 MG PO TABS: 40.0000 mg | ORAL_TABLET | Freq: Three times a day (TID) | ORAL | 3 refills | Status: DC

## 2018-05-14 NOTE — Progress Notes (Signed)
Patient ID: [name], female   DOB: 1955-12-14, 62 y.o.   MRN: [id number]    Cardio-Oncology Clinic Note  PCP: Dr. 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   She is s/p bilateral mastectomies, XRT, chemo. She is currently on Kadcyla (trastuzamab-emantsine) every 3 weeks. Underwent skin flap for skim metastasis on her abdomen in 2/15  Had skin resection with chest skin flap on 02/21/15 at [facility]. Requiring wound vac. Path ok fortunately with no recurrent malignancy.  Had clots around port-a-cath so on Elqiuis.   Had R TKA in 2018 which got infected and then had to have hardware removed and got a spacer. Hardware eventually replaced in 2019. Then developed a hematoma and wound. Chemo had to be stopped for 2 months and required a skin graft. Unfortunately when chemo stopped developed recurrent skin mets which she is now struggling with. Remains on lapatanib (taking every other day due to diarrhea) and herceptin. Had CT chest in 12/18 due to SOB. No PE but showed evidence of possible PAH. VQ ordered 5/19. No PE.  Was having left shoulder pain but bone scan negative. 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 Started on sildenafil.   Presents today for regular follow up. She is feeling better with exercise on Sildenafil. Riding a stationary bike 3 x weeks for about 25 minutes and does PT exercise for her knee about every other day. Denies SOB with ADLs or mild/mod exertion. Only DOE with steep inclines. Stairs are fine. Denies CP, dizziness. or lightheadedness. BP ok with sildenafil.   Echo today shows EF 65-70%  RV ok. Mold to moderate TR. GLS -19.1% RVSP 52  Personally reviewed   RHC 01/27/18 RA = 5 RV = 68/8 PA = 67/28 (45) PCW = 6 Fick cardiac output/index = 6.8/3.6 Thermo CO/CI =  7.4/4.0 PVR = 5.7 WU Ao sat = 98% PA sat = 79%, 79% High SVC sat = 79%  Assessment: 1. 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 Mild MR RVSP 73 IVC ok  ECHO 09/27/2013 EF 60-65% Lateral S'10.7 Grade 2 DD ECHO 02/27/2014 EF 60-65% Lateral S' 10.4  ECHO 07/14/2014  EF 60-65% Lateral S' 10.4 GLS -23.1% mild MR ECHO 04/18/2015  EF 60-65% Lateral S' 13.6 GLS -19.2% mild TR Trivial MR. RV ok ECHO 03/03/2016 EF 60-65% lateral S 11.8 GLS -19.2 poor window   Past Medical History:  Diagnosis Date  . Arthritis   . Breast cancer metastasized to skin, right [facility]) followed by dr force -- oncolgoist w/ [place]   primary left breast cancer dx 01/ 2009 ; 11/ 2009 recurrence left chest wall and neck, tx clinical trial drug and chemotherapy;  2015 recurrence cutaneous metastatized to right skin/ chest wall, 02/ 2015 resection chest wall disease and 02-23-2015 s/p skin flap surgery,  chemo every 3 weeks  . 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 Heart murmur   . 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 MVP (mitral valve prolapse)    mild mvp w/ mild regurg. per last echo 04-24-2017  . Neuropathy due to chemotherapeutic drug ([place])   . Non-healing surgical wound    right knee post re-implantation total knee arthroplasty 04-2017 unable to completely close surgical incision  . PONV (postoperative nausea and vomiting)    ponv likes  scopolamine patch  . Port-A-Cath in place    RIGHT CHEST   . Red blood cell antibody positive   . Skin changes related to chemotherapy    per patient she has scabbed over skin circumventing port to right upper chest ;  state it is due to chemptherapy   . 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 site;   treatment chronic anticoagulation    Current Outpatient Medications  Medication Sig Dispense Refill  . acetaminophen (TYLENOL) 500 MG tablet Take 500 mg by mouth every 8 (eight) hours as needed for mild pain or headache.    [name] apixaban (ELIQUIS) 5 MG TABS tablet Take 1 tablet (5 mg total) by mouth 2 (two) times daily. 60 tablet 6  . celecoxib (CELEBREX) 200 MG capsule Take 200 mg by mouth 2 (two) times daily as needed for moderate pain.     . Cholecalciferol (VITAMIN D3) 2000 UNITS TABS Take 2,000 Units by mouth 4 (four) times a week.     . diphenoxylate-atropine (LOMOTIL) 2.5-0.025 MG tablet Take 1 tablet by mouth 4 (four) times daily as needed for diarrhea or loose stools.     [name] doxycycline (MONODOX) 100 MG capsule Take 100 mg by mouth 2 (two) times daily.  6  . gabapentin (NEURONTIN) 300 MG capsule Take 1 capsule (300 mg total) by mouth 3 (three) times daily. 90 capsule 6  . lapatinib (TYKERB) 250 MG tablet Take 1,000 mg by mouth daily. Take on an empty stomach, at least 1 hour before or 1 hour after meals.     . mirabegron ER (MYRBETRIQ) 25 MG TB24 tablet Take 1 tablet (25 mg total) by mouth daily. 30 tablet 3  . ondansetron (ZOFRAN) 8 MG tablet TAKE 1 TABLET BY MOUTH EVERY 8 HOURS AS NEEDED FOR NAUSEA. (Patient taking differently: Take 8 mg by mouth every 8 (eight) hours as needed for nausea or vomiting. ) 20 tablet 6  . prochlorperazine (COMPAZINE) 10 MG tablet Take 10 mg by mouth every 6 (six) hours as needed for nausea or vomiting.   3  . sildenafil (REVATIO) 20 MG tablet Take 1 tablet (20 mg total) by mouth 3 (three)  times daily. 90 tablet 3  . Trastuzumab (HERCEPTIN IV) Inject 1 Syringe into the vein every 21 ( twenty-one) days.     [name] zolpidem (AMBIEN) 5 MG tablet TAKE 1 TABLET BY MOUTH ONCE DAILY AT BEDTIME AS NEEDED FOR SLEEP (Patient taking differently: Take 5 mg by mouth at bedtime as needed for sleep. ) 10 tablet 0  . cetirizine (ZYRTEC) 10 MG tablet Take 1 tablet (10 mg total) daily by mouth. (Patient not taking: Reported on 01/21/2018) 30 tablet 11   No current facility-administered medications for this encounter.    Allergies  Allergen Reactions  . Penicillins Hives,  Itching, Rash and Other (See Comments)    PATIENT HAS HAD A PCN REACTION WITH IMMEDIATE RASH, FACIAL/TONGUE/THROAT SWELLING, SOB, OR LIGHTHEADEDNESS WITH HYPOTENSION:  #  #  YES  #  #  Has patient had a PCN reaction causing severe rash involving mucus membranes or skin necrosis: No Has patient had a PCN reaction that required hospitalization No Has patient had a PCN reaction occurring within the last 10 years: yes Denies airway involvement   . Tape Hives and Other (See Comments)    Can tolerate paper and adhesive,  NO MEDIPORE TAPE  . Other Rash and Other (See Comments)    STERI STRIPS - Blisters    Social History   Socioeconomic History  . Marital status: Married    Spouse name: Not on file  . Number of children: 3  . Years of education: Not on file  . Highest education level: Not on file  Occupational History  . 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 Financial resource strain: Not on file  . Food insecurity:    Worry: Not on file    Inability: Not on file  . Transportation needs:    Medical: Not on file    Non-medical: Not on file  Tobacco Use  . Smoking status: Never Smoker  . Smokeless tobacco: Never Used  Substance and Sexual Activity  . Alcohol use: No  . Drug use: No  . Sexual activity: Yes  Lifestyle  . Physical activity:    Days per week: Not on file    Minutes per session: Not on file    . Stress: Not on file  Relationships  . Social connections:    Talks on phone: Not on file    Gets together: Not on file    Attends religious service: Not on file    Active member of club or organization: Not on file    Attends meetings of clubs or organizations: Not on file    Relationship status: Not on file  . Intimate partner violence:    Fear of current or ex partner: Not on file    Emotionally abused: Not on file    Physically abused: Not on file    Forced sexual activity: Not on file  Other Topics Concern  . Not on file  Social History Narrative   Caffeine use: none   Regular exercise:  No   Married- lives with 86 year old daughter and her husband   Works as an Therapist, sports at the [employer] at [employer].          Family History  Problem Relation Age of Onset  . Heart disease Mother        due to mitral valve regurgiation,   . Cancer Mother        breast  . Allergic rhinitis Mother   . Parkinsonism Father   . Allergic rhinitis Father   . Migraines Father   . Alcohol abuse Paternal Grandfather   . Angioedema Neg Hx   . Asthma Neg Hx   . Eczema Neg Hx     PHYSICAL EXAM: Vitals:   05/14/18 0953  BP: 136/88  Pulse: 99  SpO2: 97%  Weight: 82.1 kg (181 lb)   General: Well appearing. No resp difficulty. HEENT: Normal Neck: Supple. JVP 5-6. Carotids 2+ bilat; no bruits. No thyromegaly or nodule noted. Cor: PMI nondisplaced. RRR, 2/6 TR Lungs: CTAB, normal effort. Abdomen: Soft, non-tender, non-distended, no HSM. No bruits or masses. +  BS  Extremities: No cyanosis, clubbing, or rash. Trace ankle edema.  Neuro: Alert & orientedx3, cranial nerves grossly intact. moves all 4 extremities w/o difficulty. Affect pleasant   ASSESSMENT & PLAN: 1. Breast CA, stage IV with skin mets - HER 2-neu+ , ER/PR -, BRCA - - Continue Herceptin via [facility] as well lapatanib  2. S/p RTKA with prosthetic infection - resolved 3. PAH - Echo today EF 65-70%  RV ok. Mold to moderate TR. GLS -19.1%  RVSP 52  Personally reviewed - CT chest No PE - VQ 5/19 No PE - [facility] CT chest 12/14/17 No comment on pulmonary artery opacification   - Mod PAH with high output and pulmonary arteriopathy PA 67/28 (45)  - She has had a seemingly good response to sildenafil. Will increase to 40 TID.  - Will follow closely can add 2nd agent as needed. Suspect she may have pulmonary arteriopathy due to chemo/XRT regimen. - Repeat echo in 6 months  Glori Bickers, MD  11:02 AM

## 2018-05-14 NOTE — Patient Instructions (Signed)
INCREASE Sildenfil 40mg  (2 tabs) three times daily  Your physician has requested that you have an echocardiogram. Echocardiography is a painless test that uses sound waves to create images of your heart. It provides your doctor with information about the size and shape of your heart and how well your heart's chambers and valves are working. This procedure takes approximately one hour. There are no restrictions for this procedure.  Follow up with Dr. Haroldine Laws in 6 months  Happy Holidays!!

## 2018-05-14 NOTE — Progress Notes (Signed)
  Echocardiogram 2D Echocardiogram has been performed.  [name] 05/14/2018, 9:52 AM

## 2018-05-14 NOTE — Addendum Note (Signed)
Encounter addended by: Marlise Eves, RN on: 05/14/2018 11:11 AM  Actions taken: Diagnosis association updated, Order list changed, Clinical Note Signed

## 2018-05-17 DIAGNOSIS — C50412 Malignant neoplasm of upper-outer quadrant of left female breast: Secondary | ICD-10-CM | POA: Diagnosis not present

## 2018-05-17 DIAGNOSIS — C792 Secondary malignant neoplasm of skin: Secondary | ICD-10-CM | POA: Diagnosis not present

## 2018-05-17 DIAGNOSIS — Z5112 Encounter for antineoplastic immunotherapy: Secondary | ICD-10-CM | POA: Diagnosis not present

## 2018-05-20 MED FILL — TYKERB 250 MG TABLET: 250 | 28 days supply | Qty: 112 | Fill #3

## 2018-05-20 MED FILL — ONDANSETRON HCL 8 MG TABLET: 8 | 7 days supply | Qty: 20 | Fill #1

## 2018-05-20 MED FILL — DIPHENOXYLATE-ATROP 2.5-0.0: 2.5-0.025 | 30 days supply | Qty: 120 | Fill #1

## 2018-05-25 DIAGNOSIS — R3915 Urgency of urination: Secondary | ICD-10-CM | POA: Diagnosis not present

## 2018-05-25 DIAGNOSIS — M6289 Other specified disorders of muscle: Secondary | ICD-10-CM | POA: Diagnosis not present

## 2018-05-25 DIAGNOSIS — N3941 Urge incontinence: Secondary | ICD-10-CM | POA: Diagnosis not present

## 2018-05-25 DIAGNOSIS — M6281 Muscle weakness (generalized): Secondary | ICD-10-CM | POA: Diagnosis not present

## 2018-05-25 DIAGNOSIS — M62838 Other muscle spasm: Secondary | ICD-10-CM | POA: Diagnosis not present

## 2018-05-27 ENCOUNTER — Encounter: Payer: Self-pay | Admitting: Family Medicine

## 2018-05-27 ENCOUNTER — Other Ambulatory Visit: Payer: Self-pay

## 2018-05-27 ENCOUNTER — Ambulatory Visit (INDEPENDENT_AMBULATORY_CARE_PROVIDER_SITE_OTHER): Payer: 59 | Admitting: Family Medicine

## 2018-05-27 ENCOUNTER — Other Ambulatory Visit (HOSPITAL_COMMUNITY): Payer: Self-pay

## 2018-05-27 VITALS — BP 112/68 | HR 94 | Temp 98.9°F | Resp 15 | Ht 67.0 in | Wt 177.8 lb

## 2018-05-27 DIAGNOSIS — J329 Chronic sinusitis, unspecified: Secondary | ICD-10-CM

## 2018-05-27 DIAGNOSIS — B9689 Other specified bacterial agents as the cause of diseases classified elsewhere: Secondary | ICD-10-CM

## 2018-05-27 MED ORDER — SULFAMETHOXAZOLE-TRIMETHOPRIM 800-160 MG PO TABS: 1.0000 | ORAL_TABLET | Freq: Two times a day (BID) | ORAL | 0 refills | Status: DC

## 2018-05-27 MED ORDER — SILDENAFIL CITRATE 20 MG PO TABS: 40.0000 mg | ORAL_TABLET | Freq: Three times a day (TID) | ORAL | 6 refills | Status: DC

## 2018-05-27 MED ORDER — GUAIFENESIN-CODEINE 100-10 MG/5ML PO SYRP: 10.0000 mL | ORAL_SOLUTION | Freq: Three times a day (TID) | ORAL | 0 refills | Status: DC | PRN

## 2018-05-27 MED FILL — GUAIATUSSIN AC LIQUID: 100-10 | 4 days supply | Qty: 120 | Fill #0

## 2018-05-27 MED FILL — SILDENAFIL CITRATE 20 MG TA: 20 | 30 days supply | Qty: 180 | Fill #0

## 2018-05-27 MED FILL — SULFAMETHOXAZOLE-TMP DS TAB: 800-160 | 10 days supply | Qty: 20 | Fill #0

## 2018-05-27 NOTE — Progress Notes (Signed)
   Subjective:    Patient ID: [name], female    DOB: Mar 05, 1956, 63 y.o.   MRN: [id number]  HPI URI- sxs started 12/26 w/ sore throat and HA.  + sick contacts.  Now w/ 'thick' cough, HA, + sinus pain.  No tooth pain.  Bilateral ear fullness.  + nausea w/ cough.  No vomiting.  + low grade temps.   Review of Systems For ROS see HPI     Objective:   Physical Exam Vitals signs reviewed.  Constitutional:      General: She is not in acute distress.    Appearance: She is well-developed.  HENT:     Head: Normocephalic and atraumatic.     Right Ear: Tympanic membrane normal.     Left Ear: Tympanic membrane normal.     Nose: Mucosal edema and rhinorrhea present.     Right Sinus: Maxillary sinus tenderness and frontal sinus tenderness present.     Left Sinus: Maxillary sinus tenderness and frontal sinus tenderness present.     Mouth/Throat:     Pharynx: Uvula midline. Posterior oropharyngeal erythema present. No oropharyngeal exudate.  Eyes:     Conjunctiva/sclera: Conjunctivae normal.     Pupils: Pupils are equal, round, and reactive to light.  Neck:     Musculoskeletal: Normal range of motion and neck supple.  Cardiovascular:     Rate and Rhythm: Normal rate and regular rhythm.     Heart sounds: Normal heart sounds.  Pulmonary:     Effort: Pulmonary effort is normal. No respiratory distress.     Breath sounds: Normal breath sounds. No wheezing.  Lymphadenopathy:     Cervical: No cervical adenopathy.           Assessment & Plan:  Bacterial sinusitis- new.  Pt is immunosuppressed due to chemo and at high risk for bacterial infxn.  Start abx.  Reviewed supportive care and red flags that should prompt return.  Pt expressed understanding and is in agreement w/ plan.

## 2018-05-27 NOTE — Patient Instructions (Signed)
Follow up as needed or as scheduled START the Bactrim twice daily- take w/ food Drink plenty of fluids REST! Use the codeine cough syrup as needed- may cause drowsiness Call with any questions or concerns Hang in there! Happy New Year!

## 2018-05-28 DIAGNOSIS — L570 Actinic keratosis: Secondary | ICD-10-CM | POA: Diagnosis not present

## 2018-05-28 DIAGNOSIS — D485 Neoplasm of uncertain behavior of skin: Secondary | ICD-10-CM | POA: Diagnosis not present

## 2018-05-28 DIAGNOSIS — L929 Granulomatous disorder of the skin and subcutaneous tissue, unspecified: Secondary | ICD-10-CM | POA: Diagnosis not present

## 2018-05-30 ENCOUNTER — Other Ambulatory Visit (HOSPITAL_COMMUNITY): Payer: Self-pay | Admitting: Internal Medicine

## 2018-05-31 DIAGNOSIS — C50919 Malignant neoplasm of unspecified site of unspecified female breast: Secondary | ICD-10-CM | POA: Diagnosis not present

## 2018-05-31 DIAGNOSIS — I272 Pulmonary hypertension, unspecified: Secondary | ICD-10-CM | POA: Diagnosis not present

## 2018-05-31 DIAGNOSIS — R740 Nonspecific elevation of levels of transaminase and lactic acid dehydrogenase [LDH]: Secondary | ICD-10-CM | POA: Diagnosis not present

## 2018-06-01 ENCOUNTER — Encounter: Payer: Self-pay | Admitting: Family Medicine

## 2018-06-03 ENCOUNTER — Telehealth (HOSPITAL_COMMUNITY): Payer: Self-pay

## 2018-06-03 NOTE — Telephone Encounter (Signed)
PA initiated via [facility] for Sildenafil 20mg  2 tabs TID

## 2018-06-04 MED FILL — MYRBETRIQ ER 50 MG TABLET: 50 | 30 days supply | Qty: 30 | Fill #2

## 2018-06-04 MED FILL — ELIQUIS 5 MG TABLET: 5 | 30 days supply | Qty: 60 | Fill #2

## 2018-06-06 IMAGING — CR DG CHEST 2V
2 series · 2 of 2 positions shown · non-contrast
Comparison: Chest x-ray 05/07/2017, 05/10/2016.  CT 05/07/2017..

CLINICAL DATA: Shortness of breath.

EXAM:
CHEST - 2 VIEW

[chest pa]
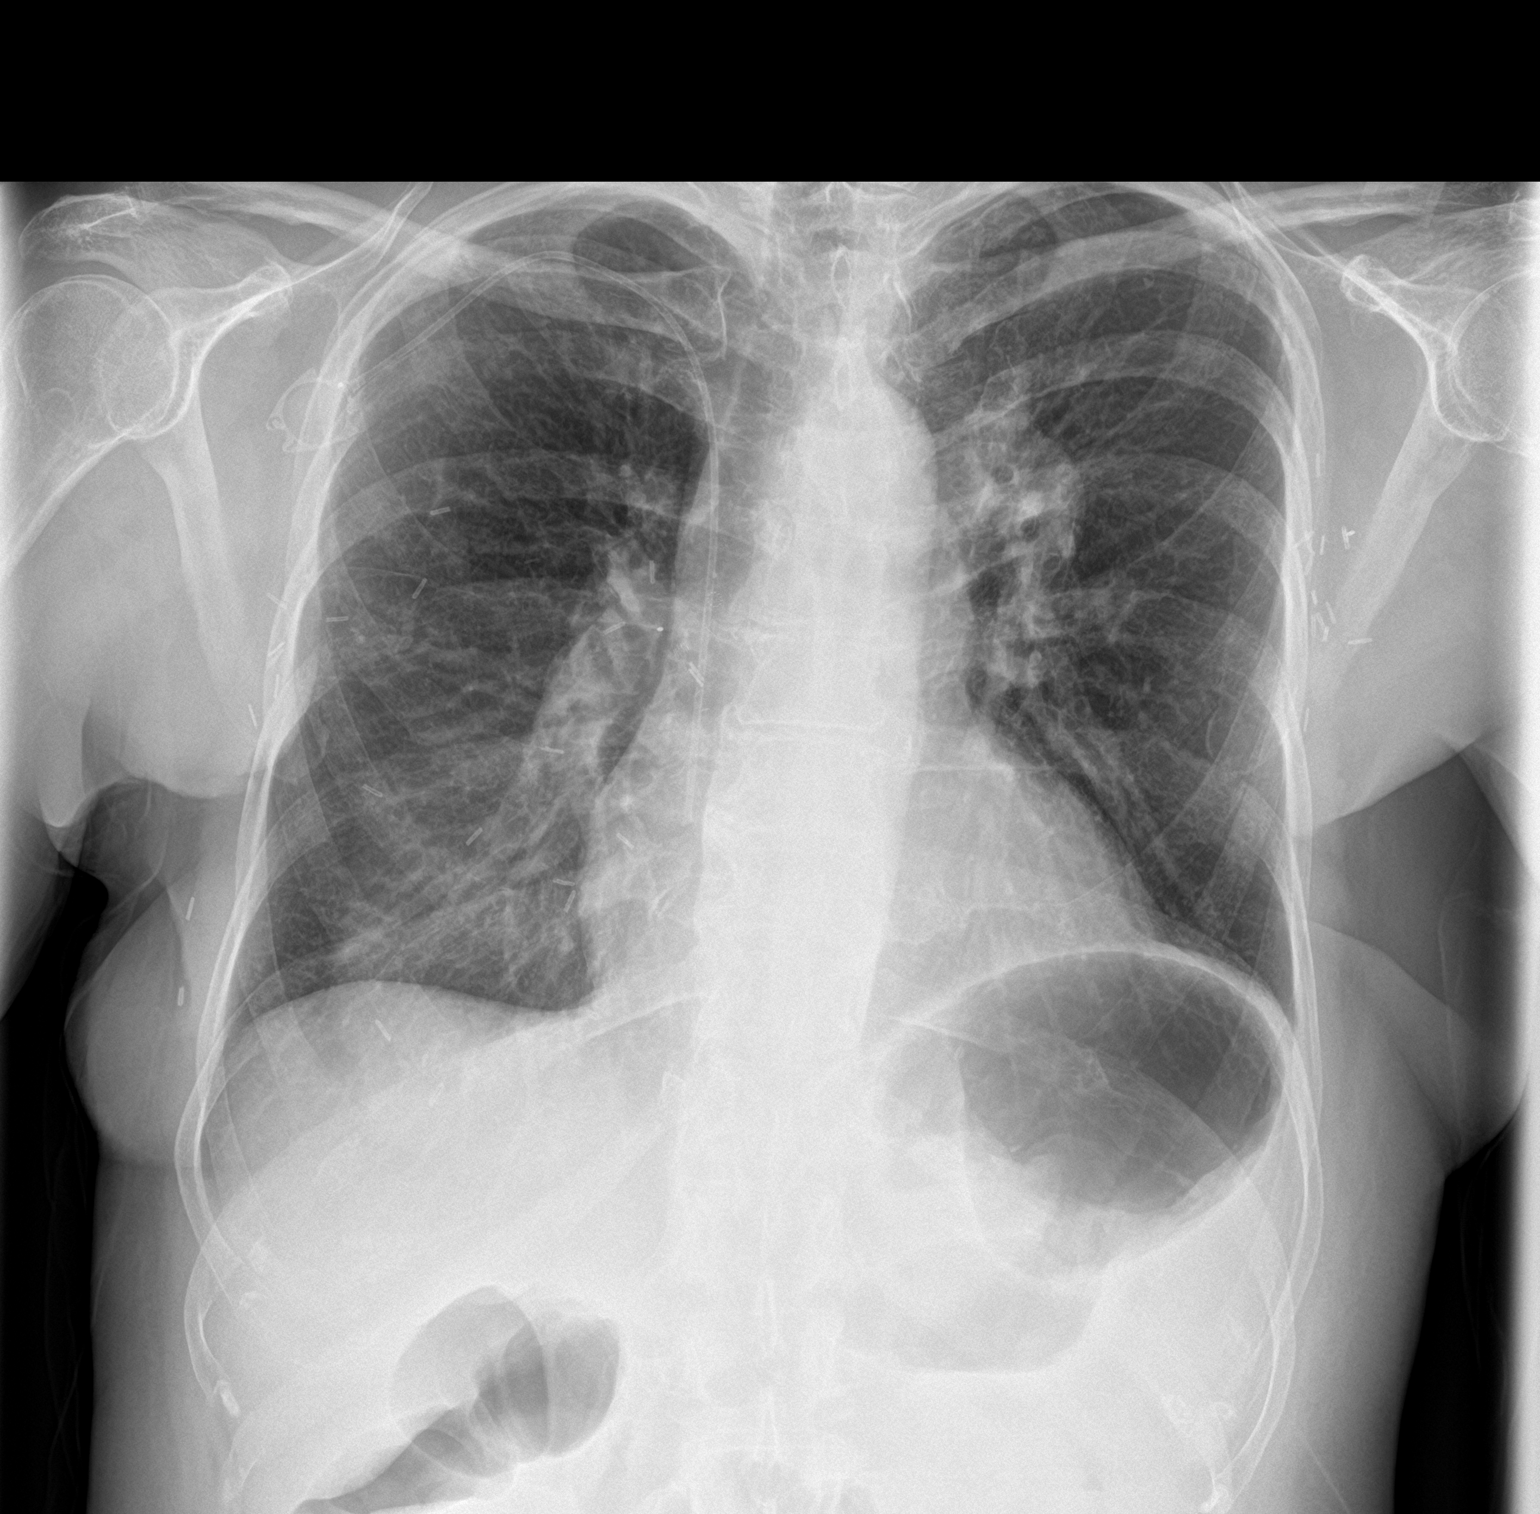

[chest lat]
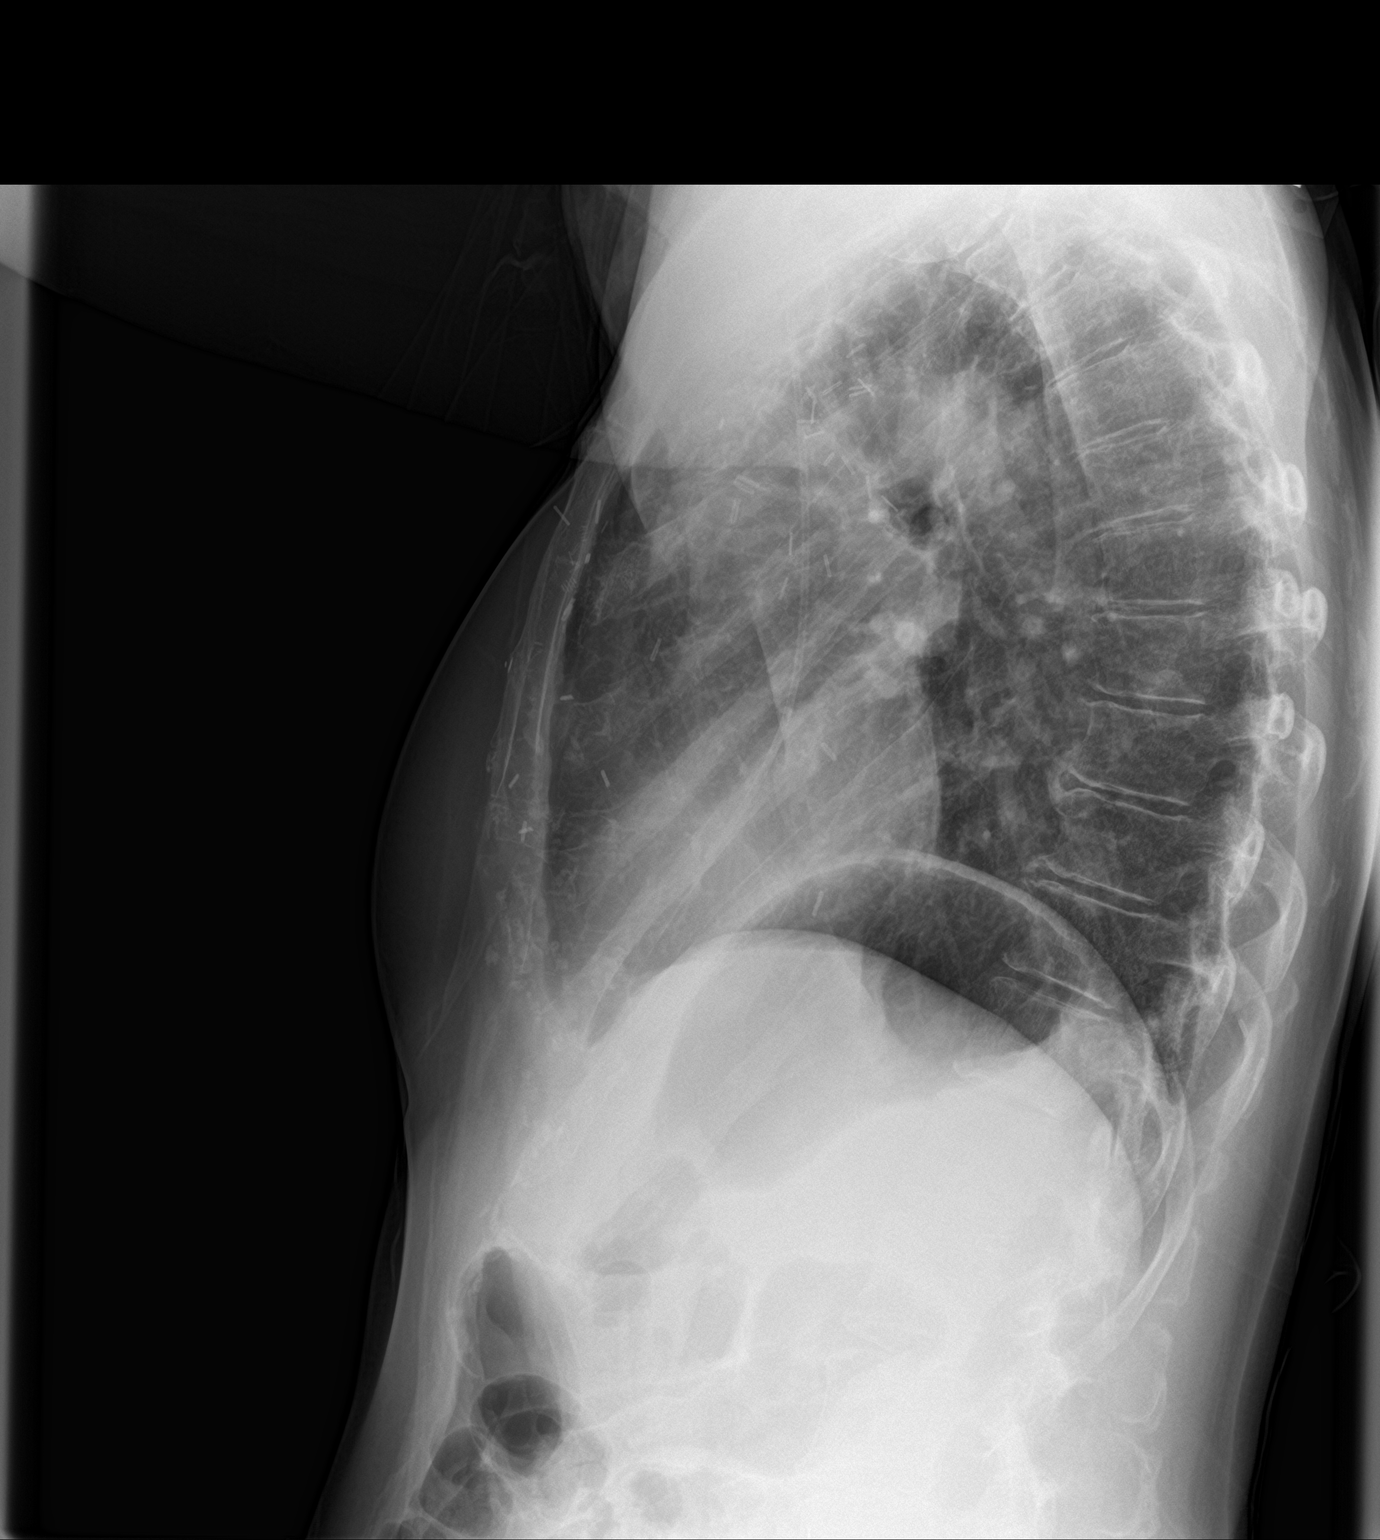

[2 of 2 positions shown; findings below may reference images not displayed]

FINDINGS: Interim removal of left PICC line. PowerPort catheter with tip in
stable position. Cardiomegaly. Stable retraction of the left hilum
possibly related to scarring. No acute infiltrate. No pleural
effusion or pneumothorax.
IMPRESSION: 1. Interim removal of left PICC line. PowerPort catheter with tip in
stable position.

2. Stable cardiomegaly. Stable retraction of the left hilum
consistent with scarring. No acute cardiopulmonary disease.

## 2018-06-07 DIAGNOSIS — I7789 Other specified disorders of arteries and arterioles: Secondary | ICD-10-CM | POA: Diagnosis not present

## 2018-06-07 DIAGNOSIS — I289 Disease of pulmonary vessels, unspecified: Secondary | ICD-10-CM | POA: Diagnosis not present

## 2018-06-07 DIAGNOSIS — Z9012 Acquired absence of left breast and nipple: Secondary | ICD-10-CM | POA: Diagnosis not present

## 2018-06-07 DIAGNOSIS — C50412 Malignant neoplasm of upper-outer quadrant of left female breast: Secondary | ICD-10-CM | POA: Diagnosis not present

## 2018-06-08 DIAGNOSIS — M2550 Pain in unspecified joint: Secondary | ICD-10-CM | POA: Diagnosis not present

## 2018-06-08 DIAGNOSIS — Z5111 Encounter for antineoplastic chemotherapy: Secondary | ICD-10-CM | POA: Diagnosis not present

## 2018-06-08 DIAGNOSIS — M7989 Other specified soft tissue disorders: Secondary | ICD-10-CM | POA: Diagnosis not present

## 2018-06-08 DIAGNOSIS — Z5112 Encounter for antineoplastic immunotherapy: Secondary | ICD-10-CM | POA: Diagnosis not present

## 2018-06-08 DIAGNOSIS — Z171 Estrogen receptor negative status [ER-]: Secondary | ICD-10-CM | POA: Diagnosis not present

## 2018-06-08 DIAGNOSIS — C792 Secondary malignant neoplasm of skin: Secondary | ICD-10-CM | POA: Diagnosis not present

## 2018-06-08 DIAGNOSIS — C50412 Malignant neoplasm of upper-outer quadrant of left female breast: Secondary | ICD-10-CM | POA: Diagnosis not present

## 2018-06-08 DIAGNOSIS — I340 Nonrheumatic mitral (valve) insufficiency: Secondary | ICD-10-CM | POA: Diagnosis not present

## 2018-06-08 DIAGNOSIS — I272 Pulmonary hypertension, unspecified: Secondary | ICD-10-CM | POA: Diagnosis not present

## 2018-06-08 DIAGNOSIS — M899 Disorder of bone, unspecified: Secondary | ICD-10-CM | POA: Diagnosis not present

## 2018-06-08 DIAGNOSIS — C50912 Malignant neoplasm of unspecified site of left female breast: Secondary | ICD-10-CM | POA: Diagnosis not present

## 2018-06-09 ENCOUNTER — Telehealth (HOSPITAL_COMMUNITY): Payer: Self-pay

## 2018-06-09 DIAGNOSIS — N3941 Urge incontinence: Secondary | ICD-10-CM | POA: Diagnosis not present

## 2018-06-09 DIAGNOSIS — R3915 Urgency of urination: Secondary | ICD-10-CM | POA: Diagnosis not present

## 2018-06-09 DIAGNOSIS — M6281 Muscle weakness (generalized): Secondary | ICD-10-CM | POA: Diagnosis not present

## 2018-06-09 DIAGNOSIS — M6289 Other specified disorders of muscle: Secondary | ICD-10-CM | POA: Diagnosis not present

## 2018-06-09 DIAGNOSIS — M62838 Other muscle spasm: Secondary | ICD-10-CM | POA: Diagnosis not present

## 2018-06-09 NOTE — Telephone Encounter (Signed)
PA denial for Sildenafil received for med quantity. Insurance only covers 20mg  TID (3 tabs a day) and not the 6 requested.   Verbal appeal phoned in to [facility] at [phone].  Per rep, decision will take 48-72 hours.  Phone and fax number provided for office.

## 2018-06-11 ENCOUNTER — Encounter: Payer: Self-pay | Admitting: Family Medicine

## 2018-06-11 MED ORDER — CELECOXIB 200 MG PO CAPS: 200.0000 mg | ORAL_CAPSULE | Freq: Two times a day (BID) | ORAL | 1 refills | Status: DC | PRN

## 2018-06-11 MED FILL — CAPECITABINE 500 MG TABLET: 500 | 28 days supply | Qty: 28 | Fill #0

## 2018-06-11 MED FILL — CELECOXIB 200 MG CAP: 200 | 90 days supply | Qty: 180 | Fill #0

## 2018-06-11 MED FILL — GABAPENTIN 300 MG CAPSULE: 300 | 30 days supply | Qty: 90 | Fill #5

## 2018-06-11 MED FILL — DOXYCYCLINE MONO 100 MG CAP: 100 | 30 days supply | Qty: 60 | Fill #4

## 2018-06-18 ENCOUNTER — Telehealth (HOSPITAL_COMMUNITY): Payer: Self-pay

## 2018-06-18 NOTE — Telephone Encounter (Signed)
[employer] continues to deny request for increase tablets for Sildenafil after appeal.  Approved for 3 tablets (1 tab TID) until 01/2019.  Pt requires 6 tablets (2 tabs TID) per MD last visit.  Per Dr. Haroldine Laws, pt can switch to Adcirca 20mg  daily.  LM on patient VM to call office to discuss.

## 2018-06-21 MED FILL — TYKERB 250 MG TABLET: 250 | 28 days supply | Qty: 112 | Fill #0

## 2018-06-24 MED FILL — SILDENAFIL CITRATE 20 MG TA: 20 | 30 days supply | Qty: 180 | Fill #1

## 2018-06-25 ENCOUNTER — Telehealth (HOSPITAL_COMMUNITY): Payer: Self-pay

## 2018-06-25 DIAGNOSIS — L928 Other granulomatous disorders of the skin and subcutaneous tissue: Secondary | ICD-10-CM | POA: Diagnosis not present

## 2018-06-25 DIAGNOSIS — L570 Actinic keratosis: Secondary | ICD-10-CM | POA: Diagnosis not present

## 2018-06-25 NOTE — Telephone Encounter (Signed)
Prior authorization through [place] was APPROVED for Sildenafil and will expire on 06/25/2019.  Pt aware of same

## 2018-06-28 DIAGNOSIS — N3281 Overactive bladder: Secondary | ICD-10-CM | POA: Diagnosis not present

## 2018-06-29 DIAGNOSIS — C50912 Malignant neoplasm of unspecified site of left female breast: Secondary | ICD-10-CM | POA: Diagnosis not present

## 2018-06-29 DIAGNOSIS — Z171 Estrogen receptor negative status [ER-]: Secondary | ICD-10-CM | POA: Diagnosis not present

## 2018-06-29 DIAGNOSIS — L299 Pruritus, unspecified: Secondary | ICD-10-CM | POA: Diagnosis not present

## 2018-06-29 DIAGNOSIS — K819 Cholecystitis, unspecified: Secondary | ICD-10-CM | POA: Diagnosis not present

## 2018-06-29 DIAGNOSIS — C50412 Malignant neoplasm of upper-outer quadrant of left female breast: Secondary | ICD-10-CM | POA: Diagnosis not present

## 2018-06-29 DIAGNOSIS — Z5112 Encounter for antineoplastic immunotherapy: Secondary | ICD-10-CM | POA: Diagnosis not present

## 2018-06-29 DIAGNOSIS — C792 Secondary malignant neoplasm of skin: Secondary | ICD-10-CM | POA: Diagnosis not present

## 2018-06-29 DIAGNOSIS — M899 Disorder of bone, unspecified: Secondary | ICD-10-CM | POA: Diagnosis not present

## 2018-07-01 DIAGNOSIS — M62838 Other muscle spasm: Secondary | ICD-10-CM | POA: Diagnosis not present

## 2018-07-01 DIAGNOSIS — R3915 Urgency of urination: Secondary | ICD-10-CM | POA: Diagnosis not present

## 2018-07-01 DIAGNOSIS — N3941 Urge incontinence: Secondary | ICD-10-CM | POA: Diagnosis not present

## 2018-07-01 DIAGNOSIS — M6289 Other specified disorders of muscle: Secondary | ICD-10-CM | POA: Diagnosis not present

## 2018-07-01 DIAGNOSIS — M6281 Muscle weakness (generalized): Secondary | ICD-10-CM | POA: Diagnosis not present

## 2018-07-05 MED FILL — ELIQUIS 5 MG TABLET: 5 | 30 days supply | Qty: 60 | Fill #3

## 2018-07-06 MED FILL — CAPECITABINE 500 MG TABLET: 500 | 28 days supply | Qty: 28 | Fill #0

## 2018-07-12 MED FILL — DOXYCYCLINE MONO 100 MG CAP: 100 | 30 days supply | Qty: 60 | Fill #5

## 2018-07-12 MED FILL — GABAPENTIN 300 MG CAPSULE: 300 | 30 days supply | Qty: 90 | Fill #6

## 2018-07-12 MED FILL — MYRBETRIQ ER 50 MG TABLET: 50 | 30 days supply | Qty: 30 | Fill #3

## 2018-07-15 DIAGNOSIS — Z760 Encounter for issue of repeat prescription: Secondary | ICD-10-CM | POA: Diagnosis not present

## 2018-07-20 DIAGNOSIS — C50912 Malignant neoplasm of unspecified site of left female breast: Secondary | ICD-10-CM | POA: Diagnosis not present

## 2018-07-20 DIAGNOSIS — C50412 Malignant neoplasm of upper-outer quadrant of left female breast: Secondary | ICD-10-CM | POA: Diagnosis not present

## 2018-07-20 DIAGNOSIS — Z5111 Encounter for antineoplastic chemotherapy: Secondary | ICD-10-CM | POA: Diagnosis not present

## 2018-07-20 DIAGNOSIS — C792 Secondary malignant neoplasm of skin: Secondary | ICD-10-CM | POA: Diagnosis not present

## 2018-07-22 DIAGNOSIS — R3915 Urgency of urination: Secondary | ICD-10-CM | POA: Diagnosis not present

## 2018-07-22 DIAGNOSIS — M62838 Other muscle spasm: Secondary | ICD-10-CM | POA: Diagnosis not present

## 2018-07-22 DIAGNOSIS — N3941 Urge incontinence: Secondary | ICD-10-CM | POA: Diagnosis not present

## 2018-07-22 DIAGNOSIS — M6289 Other specified disorders of muscle: Secondary | ICD-10-CM | POA: Diagnosis not present

## 2018-07-22 DIAGNOSIS — M6281 Muscle weakness (generalized): Secondary | ICD-10-CM | POA: Diagnosis not present

## 2018-07-29 DIAGNOSIS — I959 Hypotension, unspecified: Secondary | ICD-10-CM | POA: Diagnosis not present

## 2018-07-29 DIAGNOSIS — Z171 Estrogen receptor negative status [ER-]: Secondary | ICD-10-CM | POA: Diagnosis not present

## 2018-07-29 DIAGNOSIS — M899 Disorder of bone, unspecified: Secondary | ICD-10-CM | POA: Diagnosis not present

## 2018-07-29 DIAGNOSIS — K5900 Constipation, unspecified: Secondary | ICD-10-CM | POA: Diagnosis not present

## 2018-07-29 DIAGNOSIS — C771 Secondary and unspecified malignant neoplasm of intrathoracic lymph nodes: Secondary | ICD-10-CM | POA: Diagnosis not present

## 2018-07-29 DIAGNOSIS — C50412 Malignant neoplasm of upper-outer quadrant of left female breast: Secondary | ICD-10-CM | POA: Diagnosis not present

## 2018-07-29 DIAGNOSIS — K819 Cholecystitis, unspecified: Secondary | ICD-10-CM | POA: Diagnosis not present

## 2018-07-29 DIAGNOSIS — Z006 Encounter for examination for normal comparison and control in clinical research program: Secondary | ICD-10-CM | POA: Diagnosis not present

## 2018-07-29 DIAGNOSIS — C792 Secondary malignant neoplasm of skin: Secondary | ICD-10-CM | POA: Diagnosis not present

## 2018-07-29 DIAGNOSIS — C50912 Malignant neoplasm of unspecified site of left female breast: Secondary | ICD-10-CM | POA: Diagnosis not present

## 2018-08-02 ENCOUNTER — Other Ambulatory Visit: Payer: Self-pay

## 2018-08-02 ENCOUNTER — Ambulatory Visit (INDEPENDENT_AMBULATORY_CARE_PROVIDER_SITE_OTHER): Payer: 59 | Admitting: Family Medicine

## 2018-08-02 ENCOUNTER — Emergency Department (HOSPITAL_COMMUNITY): Payer: 59

## 2018-08-02 ENCOUNTER — Encounter (HOSPITAL_COMMUNITY): Payer: Self-pay | Admitting: Emergency Medicine

## 2018-08-02 ENCOUNTER — Encounter: Payer: Self-pay | Admitting: Family Medicine

## 2018-08-02 ENCOUNTER — Inpatient Hospital Stay (HOSPITAL_COMMUNITY)
Admission: EM | Admit: 2018-08-02 | Discharge: 2018-08-06 | DRG: 871 | Disposition: A | Discharge status: Home / Self Care | Payer: 59 | Attending: Internal Medicine | Admitting: Internal Medicine

## 2018-08-02 VITALS — BP 118/62 | HR 98 | Temp 99.5°F | Ht 67.0 in | Wt 190.0 lb

## 2018-08-02 DIAGNOSIS — R0602 Shortness of breath: Secondary | ICD-10-CM | POA: Diagnosis not present

## 2018-08-02 DIAGNOSIS — Z6829 Body mass index (BMI) 29.0-29.9, adult: Secondary | ICD-10-CM | POA: Diagnosis not present

## 2018-08-02 DIAGNOSIS — A419 Sepsis, unspecified organism: Secondary | ICD-10-CM | POA: Diagnosis not present

## 2018-08-02 DIAGNOSIS — R748 Abnormal levels of other serum enzymes: Secondary | ICD-10-CM | POA: Diagnosis not present

## 2018-08-02 DIAGNOSIS — Z880 Allergy status to penicillin: Secondary | ICD-10-CM

## 2018-08-02 DIAGNOSIS — R509 Fever, unspecified: Secondary | ICD-10-CM | POA: Diagnosis not present

## 2018-08-02 DIAGNOSIS — R000 Tachycardia, unspecified: Secondary | ICD-10-CM | POA: Diagnosis not present

## 2018-08-02 DIAGNOSIS — Z96651 Presence of right artificial knee joint: Secondary | ICD-10-CM | POA: Diagnosis present

## 2018-08-02 DIAGNOSIS — R0989 Other specified symptoms and signs involving the circulatory and respiratory systems: Secondary | ICD-10-CM

## 2018-08-02 DIAGNOSIS — Z803 Family history of malignant neoplasm of breast: Secondary | ICD-10-CM

## 2018-08-02 DIAGNOSIS — C50919 Malignant neoplasm of unspecified site of unspecified female breast: Secondary | ICD-10-CM | POA: Diagnosis present

## 2018-08-02 DIAGNOSIS — J441 Chronic obstructive pulmonary disease with (acute) exacerbation: Secondary | ICD-10-CM | POA: Diagnosis present

## 2018-08-02 DIAGNOSIS — Z171 Estrogen receptor negative status [ER-]: Secondary | ICD-10-CM | POA: Diagnosis not present

## 2018-08-02 DIAGNOSIS — G620 Drug-induced polyneuropathy: Secondary | ICD-10-CM | POA: Diagnosis present

## 2018-08-02 DIAGNOSIS — C792 Secondary malignant neoplasm of skin: Secondary | ICD-10-CM | POA: Diagnosis present

## 2018-08-02 DIAGNOSIS — R011 Cardiac murmur, unspecified: Secondary | ICD-10-CM | POA: Diagnosis present

## 2018-08-02 DIAGNOSIS — D6481 Anemia due to antineoplastic chemotherapy: Secondary | ICD-10-CM | POA: Diagnosis not present

## 2018-08-02 DIAGNOSIS — Z86718 Personal history of other venous thrombosis and embolism: Secondary | ICD-10-CM

## 2018-08-02 DIAGNOSIS — C7989 Secondary malignant neoplasm of other specified sites: Secondary | ICD-10-CM | POA: Diagnosis present

## 2018-08-02 DIAGNOSIS — D696 Thrombocytopenia, unspecified: Secondary | ICD-10-CM | POA: Diagnosis not present

## 2018-08-02 DIAGNOSIS — C50911 Malignant neoplasm of unspecified site of right female breast: Secondary | ICD-10-CM | POA: Diagnosis present

## 2018-08-02 DIAGNOSIS — Z9109 Other allergy status, other than to drugs and biological substances: Secondary | ICD-10-CM

## 2018-08-02 DIAGNOSIS — R0902 Hypoxemia: Secondary | ICD-10-CM | POA: Diagnosis not present

## 2018-08-02 DIAGNOSIS — H9201 Otalgia, right ear: Secondary | ICD-10-CM | POA: Diagnosis present

## 2018-08-02 DIAGNOSIS — M1990 Unspecified osteoarthritis, unspecified site: Secondary | ICD-10-CM | POA: Diagnosis present

## 2018-08-02 DIAGNOSIS — I272 Pulmonary hypertension, unspecified: Secondary | ICD-10-CM | POA: Diagnosis present

## 2018-08-02 DIAGNOSIS — T451X5A Adverse effect of antineoplastic and immunosuppressive drugs, initial encounter: Secondary | ICD-10-CM

## 2018-08-02 DIAGNOSIS — J159 Unspecified bacterial pneumonia: Secondary | ICD-10-CM | POA: Diagnosis not present

## 2018-08-02 DIAGNOSIS — J168 Pneumonia due to other specified infectious organisms: Secondary | ICD-10-CM | POA: Diagnosis not present

## 2018-08-02 DIAGNOSIS — D6959 Other secondary thrombocytopenia: Secondary | ICD-10-CM | POA: Diagnosis present

## 2018-08-02 DIAGNOSIS — Z7901 Long term (current) use of anticoagulants: Secondary | ICD-10-CM

## 2018-08-02 DIAGNOSIS — J9601 Acute respiratory failure with hypoxia: Secondary | ICD-10-CM | POA: Diagnosis not present

## 2018-08-02 DIAGNOSIS — E441 Mild protein-calorie malnutrition: Secondary | ICD-10-CM | POA: Diagnosis present

## 2018-08-02 DIAGNOSIS — I341 Nonrheumatic mitral (valve) prolapse: Secondary | ICD-10-CM | POA: Diagnosis present

## 2018-08-02 DIAGNOSIS — Z79899 Other long term (current) drug therapy: Secondary | ICD-10-CM

## 2018-08-02 DIAGNOSIS — Z8249 Family history of ischemic heart disease and other diseases of the circulatory system: Secondary | ICD-10-CM

## 2018-08-02 DIAGNOSIS — J440 Chronic obstructive pulmonary disease with acute lower respiratory infection: Secondary | ICD-10-CM | POA: Diagnosis present

## 2018-08-02 DIAGNOSIS — Y95 Nosocomial condition: Secondary | ICD-10-CM | POA: Diagnosis present

## 2018-08-02 DIAGNOSIS — R824 Acetonuria: Secondary | ICD-10-CM | POA: Diagnosis present

## 2018-08-02 DIAGNOSIS — Z791 Long term (current) use of non-steroidal anti-inflammatories (NSAID): Secondary | ICD-10-CM

## 2018-08-02 DIAGNOSIS — J189 Pneumonia, unspecified organism: Secondary | ICD-10-CM | POA: Diagnosis present

## 2018-08-02 DIAGNOSIS — R05 Cough: Secondary | ICD-10-CM | POA: Diagnosis not present

## 2018-08-02 LAB — COMPREHENSIVE METABOLIC PANEL
ALBUMIN: 3 g/dL — AB (ref 3.5–5.0)
ALT: 25 U/L (ref 0–44)
AST: 38 U/L (ref 15–41)
Alkaline Phosphatase: 152 U/L — ABNORMAL HIGH (ref 38–126)
Anion gap: 10 (ref 5–15)
BILIRUBIN TOTAL: 1 mg/dL (ref 0.3–1.2)
BUN: 14 mg/dL (ref 8–23)
CO2: 20 mmol/L — ABNORMAL LOW (ref 22–32)
Calcium: 8.4 mg/dL — ABNORMAL LOW (ref 8.9–10.3)
Chloride: 105 mmol/L (ref 98–111)
Creatinine, Ser: 0.72 mg/dL (ref 0.44–1.00)
GFR calc Af Amer: >60 mL/min (ref >60)
GFR calc non Af Amer: >60 mL/min (ref >60)
Glucose, Bld: 98 mg/dL (ref 70–99)
POTASSIUM: 3.9 mmol/L (ref 3.5–5.1)
Sodium: 135 mmol/L (ref 135–145)
Total Protein: 6 g/dL — ABNORMAL LOW (ref 6.5–8.1)

## 2018-08-02 LAB — CBC WITH DIFFERENTIAL/PLATELET
Abs Immature Granulocytes: 0.03 10*3/uL (ref 0.00–0.07)
BASOS PCT: 1 %
Basophils Absolute: 0 10*3/uL (ref 0.0–0.1)
Eosinophils Absolute: 0.1 10*3/uL (ref 0.0–0.5)
Eosinophils Relative: 2 %
HCT: 36.9 % (ref 36.0–46.0)
Hemoglobin: 11.3 g/dL — ABNORMAL LOW (ref 12.0–15.0)
Immature Granulocytes: 1 %
Lymphocytes Relative: 13 %
Lymphs Abs: 0.6 10*3/uL — ABNORMAL LOW (ref 0.7–4.0)
MCH: 30.5 pg (ref 26.0–34.0)
MCHC: 30.6 g/dL (ref 30.0–36.0)
MCV: 99.7 fL (ref 80.0–100.0)
MONOS PCT: 9 %
Monocytes Absolute: 0.4 10*3/uL (ref 0.1–1.0)
Neutro Abs: 3.4 10*3/uL (ref 1.7–7.7)
Neutrophils Relative %: 74 %
Platelets: 136 10*3/uL — ABNORMAL LOW (ref 150–400)
RBC: 3.7 MIL/uL — ABNORMAL LOW (ref 3.87–5.11)
RDW: 18.5 % — ABNORMAL HIGH (ref 11.5–15.5)
WBC: 4.4 10*3/uL (ref 4.0–10.5)
nRBC: 0 % (ref 0.0–0.2)

## 2018-08-02 LAB — URINALYSIS, ROUTINE W REFLEX MICROSCOPIC
Bilirubin Urine: NEGATIVE
Glucose, UA: NEGATIVE mg/dL
Hgb urine dipstick: NEGATIVE
Ketones, ur: 5 mg/dL — AB
Leukocytes,Ua: NEGATIVE
Nitrite: NEGATIVE
Protein, ur: NEGATIVE mg/dL
Specific Gravity, Urine: 1.016 (ref 1.005–1.030)
pH: 5 (ref 5.0–8.0)

## 2018-08-02 LAB — PHOSPHORUS: Phosphorus: 3.5 mg/dL (ref 2.5–4.6)

## 2018-08-02 LAB — LACTIC ACID, PLASMA
LACTIC ACID, VENOUS: 1.2 mmol/L (ref 0.5–1.9)
Lactic Acid, Venous: 9.8 mmol/L (ref 0.5–1.9)

## 2018-08-02 LAB — MAGNESIUM: Magnesium: 1.6 mg/dL — ABNORMAL LOW (ref 1.7–2.4)

## 2018-08-02 LAB — INFLUENZA PANEL BY PCR (TYPE A & B)
Influenza A By PCR: NEGATIVE
Influenza B By PCR: NEGATIVE

## 2018-08-02 MED ORDER — SODIUM CHLORIDE 0.9 % IV SOLN
2.0000 g | Freq: Three times a day (TID) | INTRAVENOUS | Status: DC
  Administered 2018-08-03 – 2018-08-06 (×11): 2 g via INTRAVENOUS
  Filled 2018-08-02 (×13): qty 2

## 2018-08-02 MED ORDER — VANCOMYCIN HCL 10 G IV SOLR
2000.0000 mg | Freq: Once | INTRAVENOUS | Status: AC
  Administered 2018-08-02: 2000 mg via INTRAVENOUS
  Filled 2018-08-02: qty 2000

## 2018-08-02 MED ORDER — LEVALBUTEROL HCL 1.25 MG/0.5ML IN NEBU
1.2500 mg | INHALATION_SOLUTION | Freq: Four times a day (QID) | RESPIRATORY_TRACT | Status: DC
  Administered 2018-08-03 (×2): 1.25 mg via RESPIRATORY_TRACT
  Filled 2018-08-02 (×3): qty 0.5

## 2018-08-02 MED ORDER — SODIUM CHLORIDE 0.9 % IV SOLN
500.0000 mg | INTRAVENOUS | Status: DC
  Administered 2018-08-02 – 2018-08-05 (×4): 500 mg via INTRAVENOUS
  Filled 2018-08-02 (×5): qty 500

## 2018-08-02 MED ORDER — SILDENAFIL CITRATE 20 MG PO TABS
40.0000 mg | ORAL_TABLET | Freq: Three times a day (TID) | ORAL | Status: DC
  Administered 2018-08-02 – 2018-08-06 (×11): 40 mg via ORAL
  Filled 2018-08-02 (×13): qty 2

## 2018-08-02 MED ORDER — VANCOMYCIN HCL IN DEXTROSE 1-5 GM/200ML-% IV SOLN: 1000.0000 mg | Freq: Once | INTRAVENOUS | Status: DC

## 2018-08-02 MED ORDER — LACTATED RINGERS IV BOLUS
1000.0000 mL | Freq: Once | INTRAVENOUS | Status: AC
  Administered 2018-08-02: 1000 mL via INTRAVENOUS

## 2018-08-02 MED ORDER — ACETAMINOPHEN 325 MG PO TABS
650.0000 mg | ORAL_TABLET | Freq: Four times a day (QID) | ORAL | Status: DC | PRN
  Administered 2018-08-02 – 2018-08-03 (×2): 650 mg via ORAL
  Filled 2018-08-02 (×2): qty 2

## 2018-08-02 MED ORDER — POTASSIUM CHLORIDE IN NACL 20-0.9 MEQ/L-% IV SOLN
INTRAVENOUS | Status: AC
  Administered 2018-08-02: via INTRAVENOUS
  Filled 2018-08-02 (×2): qty 1000

## 2018-08-02 MED ORDER — PANTOPRAZOLE SODIUM 40 MG PO TBEC
40.0000 mg | DELAYED_RELEASE_TABLET | Freq: Every day | ORAL | Status: DC
  Administered 2018-08-02 – 2018-08-06 (×5): 40 mg via ORAL
  Filled 2018-08-02 (×5): qty 1

## 2018-08-02 MED ORDER — POTASSIUM CHLORIDE IN NACL 20-0.9 MEQ/L-% IV SOLN
INTRAVENOUS | Status: DC
  Filled 2018-08-02: qty 1000

## 2018-08-02 MED ORDER — GABAPENTIN 300 MG PO CAPS
300.0000 mg | ORAL_CAPSULE | Freq: Three times a day (TID) | ORAL | Status: DC
  Administered 2018-08-02 – 2018-08-04 (×5): 300 mg via ORAL
  Filled 2018-08-02 (×5): qty 1

## 2018-08-02 MED ORDER — PROCHLORPERAZINE MALEATE 5 MG PO TABS: 10.0000 mg | ORAL_TABLET | Freq: Four times a day (QID) | ORAL | Status: DC | PRN

## 2018-08-02 MED ORDER — LEVOFLOXACIN IN D5W 750 MG/150ML IV SOLN
750.0000 mg | Freq: Once | INTRAVENOUS | Status: DC
  Filled 2018-08-02: qty 150

## 2018-08-02 MED ORDER — IPRATROPIUM BROMIDE 0.02 % IN SOLN
0.5000 mg | Freq: Four times a day (QID) | RESPIRATORY_TRACT | Status: DC
  Administered 2018-08-03 (×2): 0.5 mg via RESPIRATORY_TRACT
  Filled 2018-08-02 (×3): qty 2.5

## 2018-08-02 MED ORDER — SODIUM CHLORIDE 0.9 % IV SOLN
2.0000 g | Freq: Once | INTRAVENOUS | Status: AC
  Administered 2018-08-02: 2 g via INTRAVENOUS
  Filled 2018-08-02: qty 2

## 2018-08-02 MED ORDER — DIPHENOXYLATE-ATROPINE 2.5-0.025 MG PO TABS: 1.0000 | ORAL_TABLET | Freq: Four times a day (QID) | ORAL | Status: DC | PRN

## 2018-08-02 MED ORDER — VANCOMYCIN HCL IN DEXTROSE 750-5 MG/150ML-% IV SOLN
750.0000 mg | Freq: Three times a day (TID) | INTRAVENOUS | Status: DC
  Administered 2018-08-03 – 2018-08-05 (×8): 750 mg via INTRAVENOUS
  Filled 2018-08-02 (×9): qty 150

## 2018-08-02 MED ORDER — MIRABEGRON ER 25 MG PO TB24
25.0000 mg | ORAL_TABLET | Freq: Every day | ORAL | Status: DC
  Administered 2018-08-03 – 2018-08-06 (×4): 25 mg via ORAL
  Filled 2018-08-02 (×4): qty 1

## 2018-08-02 MED ORDER — MAGNESIUM SULFATE 2 GM/50ML IV SOLN
2.0000 g | Freq: Once | INTRAVENOUS | Status: AC
  Administered 2018-08-02: 2 g via INTRAVENOUS
  Filled 2018-08-02: qty 50

## 2018-08-02 MED ORDER — IPRATROPIUM-ALBUTEROL 0.5-2.5 (3) MG/3ML IN SOLN
3.0000 mL | Freq: Once | RESPIRATORY_TRACT | Status: AC
  Administered 2018-08-02: 3 mL via RESPIRATORY_TRACT
  Filled 2018-08-02: qty 3

## 2018-08-02 MED ORDER — ZOLPIDEM TARTRATE 5 MG PO TABS: 5.0000 mg | ORAL_TABLET | Freq: Every evening | ORAL | Status: DC | PRN

## 2018-08-02 MED ORDER — APIXABAN 5 MG PO TABS
5.0000 mg | ORAL_TABLET | Freq: Two times a day (BID) | ORAL | Status: DC
  Administered 2018-08-02 – 2018-08-06 (×8): 5 mg via ORAL
  Filled 2018-08-02 (×8): qty 1

## 2018-08-02 MED ORDER — ONDANSETRON HCL 4 MG PO TABS: 4.0000 mg | ORAL_TABLET | Freq: Four times a day (QID) | ORAL | Status: DC | PRN

## 2018-08-02 MED ORDER — LORATADINE 10 MG PO TABS: 10.0000 mg | ORAL_TABLET | Freq: Every day | ORAL | Status: DC | PRN

## 2018-08-02 MED ORDER — ONDANSETRON HCL 4 MG/2ML IJ SOLN: 4.0000 mg | Freq: Four times a day (QID) | INTRAMUSCULAR | Status: DC | PRN

## 2018-08-02 MED FILL — ELIQUIS 5 MG TABLET: 5 | 30 days supply | Qty: 60 | Fill #4 | Status: TO

## 2018-08-02 MED FILL — SILDENAFIL CITRATE 20 MG TA: 20 | 30 days supply | Qty: 180 | Fill #2 | Status: TO

## 2018-08-02 NOTE — H&P (Signed)
History and Physical    AHAVA KISSOON AJO:[id number] DOB: 1956/03/27 DOA: 08/02/2018  PCP: Midge Minium, MD   Patient coming from: Home.  I have personally briefly reviewed patient's old medical records in [place]  Chief Complaint: Shortness of breath and fever.  HPI: [name] is a 63 y.o. 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  There is no travel history or known sick contacts.  She says she has some mild right earache.  She denies rhinorrhea, sore throat or hemoptysis.  No chest pain, palpitations, dizziness, diaphoresis, PND, orthopnea, but she gets frequent lower extremity edema, particularly in the evenings.  She denies abdominal pain, emesis, constipation, melena or hematochezia, but states she occasionally gets nausea and loose stools.  No dysuria, frequency or hematuria.  Denies polyuria, polydipsia, polyphagia or blurred vision.  She gets postinfusion erythema urticaria extending from her side of her Port-A-Cath.  ED Course: Initial temperature was 100.6 F, pulse 110, respirations 18, blood pressure 139/77 mmHg and O2 sat 92% on room air.  She received supplemental oxygen, cefepime, azithromycin and vancomycin in the emergency department.  I added her LR 1000 mL bolus, magnesium sulfate 2 g IVPB and a DuoNeb.  Her urinalysis showed  ketonuria 5 mg/dL, but was otherwise normal.  Blood cultures x2 were taken.  Influenza a and B by PCR were negative.  White count was 4.4 with 74% neutrophils, 13% lymphocytes and 9% monocytes.  Hemoglobin 11.3 g/dL and platelets 136.  CMP shows a CO2 of 20 mmol/L.  The rest of the electrolytes are within normal limits.  Renal function is normal.  Total protein was 6.0 and albumin 3.0 g/dL.  Alkaline phosphatase was 152 U/L.  The rest of the hepatic function is within normal limits.  Lactic acid was 1.2.   Imaging: Her chest radiograph showed superior retraction of the prominent left hilum.  Right Port-A-Cath is present.  There are no pulmonary nodules or masses.  No focal infiltrates or overt edema.  Please see images and full radiology report for further detail.  Review of Systems: As per HPI otherwise 10 point review of systems negative.   Past Medical History:  Diagnosis Date  . Arthritis   . Breast cancer metastasized to skin, right [facility]) followed by dr force -- oncolgoist w/ [place]   primary left breast cancer dx 01/ 2009 ; 11/ 2009 recurrence left chest wall and neck, tx clinical trial drug and chemotherapy;  2015 recurrence cutaneous metastatized to right skin/ chest wall, 02/ 2015 resection chest wall disease and 02-23-2015 s/p skin flap surgery,  chemo every 3 weeks  . 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 Heart murmur   . 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 MVP (mitral valve prolapse)    mild mvp w/ mild regurg. per last echo 04-24-2017  . Neuropathy due to chemotherapeutic drug ([place])   . Non-healing surgical wound    right  knee post re-implantation total knee arthroplasty 04-2017 unable to completely close surgical incision  . PONV (postoperative nausea and vomiting)    ponv likes scopolamine patch  . Port-A-Cath in place    RIGHT CHEST   . Red blood cell antibody positive   . Skin changes related to chemotherapy    per patient she has scabbed over skin circumventing port to right upper chest ;  state it is due to chemptherapy   . 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 APPLICATION OF A-CELL OF BACK Right 07/31/2016   Procedure: CELLERATE COLLAGEN PLACEMENT;  Surgeon: Loel Lofty Dillingham, DO;  Location: [place];  Service: Plastics;  Laterality: Right;  . APPLICATION OF WOUND VAC Right 06/29/2017   Procedure: APPLICATION OF WOUND VAC;  Surgeon: Wallace Going, DO;  [facility];  Service: Plastics;  Laterality: Right;  . DEBRIDEMENT CHEST WALL RIGHT/ VAC PLACEMENT  02-09-2015    [facility]  . EXCISIONAL TOTAL KNEE ARTHROPLASTY WITH ANTIBIOTIC SPACERS Right 12/02/2016   Procedure: EXCISIONAL TOTAL KNEE ARTHROPLASTY WITH ANTIBIOTIC SPACERS;  Surgeon: Paralee Cancel, MD;  [facility];  Service: Orthopedics;  Laterality: Right;  90 mins  . HEMATOMA EVACUATION Right 06/29/2017   Procedure: EVACUATION HEMATOMA OF RIGHT KNEE;  Surgeon: Wallace Going, DO;  [facility];  Service: Plastics;  Laterality: Right;  . HEMATOMA EVACUATION Right 06/29/2017   Procedure: EVACUATION RIGHT TOTAL KNEE HEMATOMA;  Surgeon: Paralee Cancel, MD;  [facility];  Service: Orthopedics;  Laterality: Right;  . HEMATOMA EVACUATION Right 07/14/2017   Procedure: EVACUATION RIGHT LEG HEMATOMA WITH APPLICATION OF WOUND VAC;  Surgeon: Paralee Cancel, MD;  [facility];  Service: Orthopedics;  Laterality: Right;  . hemotoma     evacuation left chest wall  . I&D  EXTREMITY Right 07/17/2017   Procedure: EVACUATION RIGHT LEG HEMATOMA WITH WOUND VAC DRESSING CHANGE;  Surgeon: Paralee Cancel, MD;  [facility];  Service: Orthopedics;  Laterality: Right;  . I&D KNEE WITH POLY EXCHANGE Right 05/28/2016   Procedure: IRRIGATION AND DEBRIDEMENT KNEE WOUND VAC PLACMENT;  Surgeon: Susa Day, MD;  [facility];  Service: Orthopedics;  Laterality: Right;  . I&D KNEE WITH POLY EXCHANGE Right 05/30/2016   Procedure: RADICAL SYNOVECTOMY,IRRIGATION AND DEBRIDEMENT KNEE WITH POLY EXCHANGE WITH ANTIBIOTIC BEADS, APPLICATION OF WOUND VAC;  Surgeon: Susa Day, MD;  [facility];  Service: Orthopedics;  Laterality: Right;  . INCISION AND DRAINAGE OF WOUND Right 07/31/2016   Procedure: IRRIGATION AND DEBRIDEMENT RIGHT KNEE  WOUND;  Surgeon: Loel Lofty Dillingham, DO;  Location: [place];  Service: Plastics;  Laterality: Right;  . IR FLUORO GUIDE CV LINE LEFT  04/30/2017  . IR US GUIDE VASC ACCESS LEFT  04/30/2017  . IRRIGATION AND DEBRIDEMENT KNEE Right 04/12/2016   Procedure: IRRIGATION AND DEBRIDEMENT KNEE;  Surgeon: Nicholes Stairs, MD;  [facility];  Service: Orthopedics;  Laterality: Right;  .  IRRIGATION AND DEBRIDEMENT KNEE Right 12/16/2016   Procedure: Repeat irrigation and debridement right knee, wound closure  wound vac REPLACEMENT ANTIBIOTIC SPACERS;  Surgeon: Paralee Cancel, MD;  [facility];  Service: Orthopedics;  Laterality: Right;  . KNEE ARTHROSCOPY  02/13/2012   Procedure: ARTHROSCOPY KNEE;  Surgeon: Johnn Hai, MD;  Location: [facility];  Service: Orthopedics;  Laterality: Left;  WITH DEBRIDEMENt   . KNEE ARTHROSCOPY WITH LATERAL MENISECTOMY  02/13/2012   Procedure: KNEE ARTHROSCOPY WITH LATERAL MENISECTOMY;  Surgeon: Johnn Hai, MD;  Location: [facility];  Service: Orthopedics;;  partial  . LATISSIMUS FLAP RIGHT CHEST  02-23-2015   [facility]  . LEFT MODIFIED RADICAL MASTECTOMY/ RIGHT TOTAL MASTECTOMY   11-13-2007   LEFT BREAST CANCER W/ AXILLARY LYMPH NODE METASTASIS AND POST NEOADJUVANT CHEMO  . MASS EXCISION Right 06/29/2017   Procedure: EXCISION OF METASTATIC BREAST CANCER TO SKIN RIGHT SHOULDER, PLACEMENT OF CELLERATE;  Surgeon: Wallace Going, DO;  [facility];  Service: Plastics;  Laterality: Right;  . PLACEMENT PORT-A-CATH  06/22/2007    new port placed 2015, right chest  . REIMPLANTATION OF TOTAL KNEE Right 04/27/2017   Procedure: Reimplantation of right total knee arthroplasty;  Surgeon: Paralee Cancel, MD;  [facility];  Service: Orthopedics;  Laterality: Right;  Adductor Block  . RESECTION OF ISOLATED CHEST WALL DISEASE/ ABDOMINAL SKIN FLAP  02/ 2015     [facility]   RIGHT CHEST WALL METASTATIC SKIN CARCINOMA  . RIGHT HEART CATH N/A 01/27/2018   Procedure: RIGHT HEART CATH;  Surgeon: Jolaine Artist, MD;  Location: [place] [facility];  Service: Cardiovascular;  Laterality: N/A;  . SKIN SPLIT GRAFT Right 01/29/2017   Procedure: SKIN GRAFT SPLIT THICKNESS TO RIGHT KNEE WOUND;  Surgeon: Wallace Going, DO;  [facility];  Service: Plastics;  Laterality: Right;  . SKIN SPLIT GRAFT Right 09/07/2017   Procedure: SKIN GRAFT SPLIT THICKNESS TO RIGHT KNEE WOUND WITH PLACEMENT OF VAC DRESSING TO GRAFT SITE;  Surgeon: Wallace Going, DO;  Location: [place];  Service: Plastics;  Laterality: Right;  . TOTAL KNEE ARTHROPLASTY Left 09/23/2012   Procedure: LEFT TOTAL KNEE ARTHROPLASTY;  Surgeon: Johnn Hai, MD;  [facility];  Service: Orthopedics;  Laterality: Left;  . TOTAL KNEE ARTHROPLASTY Right 03/20/2016   Procedure: RIGHT TOTAL KNEE ARTHROPLASTY;  Surgeon: Susa Day, MD;  [facility];  Service: Orthopedics;  Laterality: Right;  . TRANSTHORACIC ECHOCARDIOGRAM  04/24/2017   ef 48-54%, grade 1 diastolic dysfuction/  mild MR with mild prolapse anterior leaflet/ mild TR/ PASP 62mmHg     reports that she has never smoked. She has never  used smokeless tobacco. She reports that she does not drink alcohol or use drugs.  Allergies  Allergen Reactions  . Penicillins Hives, Itching, Rash and Other (See Comments)    Patient has previously tolerated cefazolin, ceftriaxone, and cefepime  PATIENT HAS HAD A PCN REACTION WITH IMMEDIATE RASH, FACIAL/TONGUE/THROAT SWELLING, SOB, OR LIGHTHEADEDNESS WITH HYPOTENSION:  #  #  YES  #  #  Has patient had a PCN reaction causing severe rash involving mucus membranes or skin necrosis: No Has patient had a PCN reaction that required hospitalization No Has patient had a PCN reaction occurring within the last 10 years: yes Denies airway involvement   . Tape Hives and Other (See Comments)    Can tolerate paper and adhesive,  NO MEDIPORE TAPE  . Other Rash and  Other (See Comments)    STERI STRIPS - Blisters    Family History  Problem Relation Age of Onset  . Heart disease Mother        due to mitral valve regurgiation,   . Cancer Mother        breast  . Allergic rhinitis Mother   . Parkinsonism Father   . Allergic rhinitis Father   . Migraines Father   . Alcohol abuse Paternal Grandfather   . Angioedema Neg Hx   . Asthma Neg Hx   . Eczema Neg Hx    Prior to Admission medications   Medication Sig Start Date End Date Taking? Authorizing Provider  acetaminophen (TYLENOL) 500 MG tablet Take 500 mg by mouth every 8 (eight) hours as needed for mild pain or headache.   Yes [provider]  apixaban (ELIQUIS) 5 MG TABS tablet Take 1 tablet (5 mg total) by mouth 2 (two) times daily. 04/02/18  Yes Midge Minium, MD  celecoxib (CELEBREX) 200 MG capsule Take 1 capsule (200 mg total) by mouth 2 (two) times daily as needed for moderate pain. Patient taking differently: Take 200 mg by mouth daily.  06/11/18  Yes Paz, Alda Berthold, MD  cetirizine (ZYRTEC) 10 MG tablet Take 1 tablet (10 mg total) daily by mouth. 04/03/17  Yes Midge Minium, MD  Cholecalciferol (VITAMIN D3) 2000 UNITS TABS  Take 2,000 Units by mouth 4 (four) times a week.    Yes [provider]  diphenoxylate-atropine (LOMOTIL) 2.5-0.025 MG tablet Take 1 tablet by mouth 4 (four) times daily as needed for diarrhea or loose stools.  09/12/17  Yes [provider]  doxycycline (MONODOX) 100 MG capsule Take 100 mg by mouth 2 (two) times daily. 01/28/18  Yes [provider]  gabapentin (NEURONTIN) 300 MG capsule Take 1 capsule (300 mg total) by mouth 3 (three) times daily. 01/12/18  Yes Midge Minium, MD  mirabegron ER (MYRBETRIQ) 25 MG TB24 tablet Take 1 tablet (25 mg total) by mouth daily. 02/08/18  Yes Midge Minium, MD  ondansetron (ZOFRAN) 8 MG tablet TAKE 1 TABLET BY MOUTH EVERY 8 HOURS AS NEEDED FOR NAUSEA. Patient taking differently: Take 8 mg by mouth every 8 (eight) hours as needed for nausea or vomiting.  11/06/17  Yes Midge Minium, MD  prochlorperazine (COMPAZINE) 10 MG tablet Take 10 mg by mouth every 6 (six) hours as needed for nausea or vomiting.  09/12/17  Yes [provider]  sildenafil (REVATIO) 20 MG tablet Take 2 tablets (40 mg total) by mouth 3 (three) times daily. 05/27/18  Yes Bensimhon, Shaune Pascal, MD  zolpidem (AMBIEN) 5 MG tablet TAKE 1 TABLET BY MOUTH ONCE DAILY AT BEDTIME AS NEEDED FOR SLEEP Patient taking differently: Take 5 mg by mouth at bedtime as needed for sleep.  09/17/17  Yes Brunetta Jeans, PA-C    Physical Exam: Vitals:   08/02/18 1725 08/02/18 1930 08/02/18 1931 08/02/18 2039  BP: (!) 143/74 130/73 130/73 139/68  Pulse: (!) 115 (!) 107 (!) 105 97  Resp: (!) 27  16 16   Temp:      TempSrc:      SpO2: 98% 98% 98%     Constitutional: Looks acutely ill, mildly febrile. Eyes: PERRL.  Sclerae, lids and conjunctivae look injected. ENMT: Nasal cannula in place.  Mucous membranes are moist. Posterior pharynx clear of any exudate or lesions. Neck: normal, supple, no masses, no thyromegaly Respiratory: Decreased breath sounds with bilateral  rhonchi and wheezing. Normal respiratory effort. No accessory muscle use.  Cardiovascular: Tachycardic at 115 with a regular rhythm, +2/4 systolic murmur, rubs / gallops. No extremity edema. 2+ pedal pulses. No carotid bruits.  Abdomen: Soft, no tenderness, no masses palpated. No hepatosplenomegaly. Bowel sounds positive.  Musculoskeletal: no clubbing / cyanosis. Good ROM, no contractures. Normal muscle tone.  Skin: Extensive erythema area on her right upper chest, supraclavicular, cervical, shoulder and upper back area.  (Per patient, this has been an expected reaction after chemotherapy). Neurologic: CN 2-12 grossly intact. Sensation intact, DTR normal. Strength 5/5 in all 4.  Psychiatric: Normal judgment and insight. Alert and oriented x 4.   Labs on Admission: I have personally reviewed following labs and imaging studies  CBC: Recent Labs  Lab 08/02/18 1740  WBC 4.4  NEUTROABS 3.4  HGB 11.3*  HCT 36.9  MCV 99.7  PLT 268*   Basic Metabolic Panel: Recent Labs  Lab 08/02/18 1740 08/02/18 2100  NA 135  --   K 3.9  --   CL 105  --   CO2 20*  --   GLUCOSE 98  --   BUN 14  --   CREATININE 0.72  --   CALCIUM 8.4*  --   MG  --  1.6*  PHOS  --  3.5   GFR: Estimated Creatinine Clearance: 82.2 mL/min (by C-G formula based on SCr of 0.72 mg/dL). Liver Function Tests: Recent Labs  Lab 08/02/18 1740  AST 38  ALT 25  ALKPHOS 152*  BILITOT 1.0  PROT 6.0*  ALBUMIN 3.0*   No results for input(s): LIPASE, AMYLASE in the last 168 hours. No results for input(s): AMMONIA in the last 168 hours. Coagulation Profile: No results for input(s): INR, PROTIME in the last 168 hours. Cardiac Enzymes: No results for input(s): CKTOTAL, CKMB, CKMBINDEX, TROPONINI in the last 168 hours. BNP (last 3 results) No results for input(s): PROBNP in the last 8760 hours. HbA1C: No results for input(s): HGBA1C in the last 72 hours. CBG: No results for input(s): GLUCAP in the last 168 hours. Lipid  Profile: No results for input(s): CHOL, HDL, LDLCALC, TRIG, CHOLHDL, LDLDIRECT in the last 72 hours. Thyroid Function Tests: No results for input(s): TSH, T4TOTAL, FREET4, T3FREE, THYROIDAB in the last 72 hours. Anemia Panel: No results for input(s): VITAMINB12, FOLATE, FERRITIN, TIBC, IRON, RETICCTPCT in the last 72 hours. Urine analysis:    Component Value Date/Time   COLORURINE YELLOW 08/02/2018 2042   APPEARANCEUR CLEAR 08/02/2018 2042   LABSPEC 1.016 08/02/2018 2042   LABSPEC 1.005 02/17/2008 1122   PHURINE 5.0 08/02/2018 2042   GLUCOSEU NEGATIVE 08/02/2018 2042   HGBUR NEGATIVE 08/02/2018 2042   HGBUR trace-intact 11/22/2010 0835   BILIRUBINUR NEGATIVE 08/02/2018 2042   BILIRUBINUR negative 02/08/2018 1021   BILIRUBINUR Negative 02/17/2008 1122   KETONESUR 5 (A) 08/02/2018 2042   PROTEINUR NEGATIVE 08/02/2018 2042   UROBILINOGEN 0.2 02/08/2018 1021   UROBILINOGEN 1.0 02/01/2015 1654   NITRITE NEGATIVE 08/02/2018 2042   LEUKOCYTESUR NEGATIVE 08/02/2018 2042   LEUKOCYTESUR Moderate 02/17/2008 1122    Radiological Exams on Admission: Dg Chest 2 View  Result Date: 08/02/2018 CLINICAL DATA:  Shortness of breath.  Productive cough and fever. EXAM: CHEST - 2 VIEW COMPARISON:  Chest x-ray Oct 16, 2017 and CT scan May 07, 2017 FINDINGS: Superior retraction of the prominent left hilum is stable. The heart, hila, mediastinum, and pleura are unchanged. The right Port-A-Cath is stable terminating in the central SVC. No pulmonary  nodules or masses. No focal infiltrates. No overt edema. IMPRESSION: No interval change. Electronically Signed   By: Dorise Bullion III M.D   On: 08/02/2018 18:30   05/13/2018 echocardiogram ------------------------------------------------------------------- LV EF: 60% -   65%  ------------------------------------------------------------------- Indications:      V58.11 Chemotherapy Evaluation.  Neoplasm - breast  174.9.  ------------------------------------------------------------------- History:   PMH:   Murmur.  ------------------------------------------------------------------- Study Conclusions  - Left ventricle: The cavity size was normal. Systolic function was   normal. The estimated ejection fraction was in the range of 60%   to 65%. Wall motion was normal; there were no regional wall   motion abnormalities. Doppler parameters are consistent with   abnormal left ventricular relaxation (grade 1 diastolic   dysfunction). - Aortic valve: There was no regurgitation. - Mitral valve: There was trivial regurgitation. - Left atrium: The atrium was normal in size. - Right ventricle: The cavity size was mildly dilated. Wall   thickness was normal. Systolic function was normal. RV systolic   pressure (S, est): 52 mm Hg. - Right atrium: The atrium was mildly dilated. - Tricuspid valve: There was mild-moderate regurgitation. - Pulmonary arteries: PA peak pressure: 52 mm Hg (S). - Pericardium, extracardiac: There was no pericardial effusion.  EKG: Independently reviewed Vent. rate 109 BPM PR interval * ms QRS duration 91 ms QT/QTc 342/461 ms P-R-T axes 42 256 30 Sinus tachycardia Markedly posterior QRS axis  Assessment/Plan Principal Problem:   Sepsis due to undetermined organism ([place]) Suspect developing HCAP. Admit to telemetry/inpatient. Continue supplemental oxygen. Continue IV fluids. Continue cefepime per pharmacy. Continue vancomycin per pharmacy. Continue azithromycin 500 mg IVPB every 24 hours. Follow-up blood cultures and sensitivity. Check strep pneumonia urinary antigen.  Active Problems:   Metastatic breast cancer ([place]) Received chemotherapy about 10 days ago. She was recently at [facility] on 07/29/2018 for clinical trial enrollment. Protective precautions given immunosuppressed state.    Pulmonary hypertension ([facility]) Continue Garba to 40 mg p.o. 3 times  daily.    Anemia due to chemotherapy Monitor hematocrit and hemoglobin.    Hypomagnesemia Replaced. Follow-up magnesium level as needed.    Mild protein malnutrition ([place]) Secondary to malignancy and chemotherapy. Consult nutritional services.    Elevated alkaline phosphatase level Likely due to chest wall metastasis. Follow-up level as needed.    Thrombocytopenia ([place]) Likely due to chemotherapy. Monitor platelet level.    DVT prophylaxis: On apixaban. Code Status: Full code. Family Communication: Her husband was present in the ED room. Disposition Plan: Admit for further work-up and IV antibiotic for 2 to 3 days. Consults called: Admission status: Inpatient/medical telemetry.   Reubin Milan MD [facility]  08/02/2018, 9:52 PM   This document was prepared using Dragon voice recognition software and may contain some unintended transcription errors.

## 2018-08-02 NOTE — Plan of Care (Signed)
  Problem: Clinical Measurements: Goal: Will remain free from infection Outcome: Not Met (add Reason) Goal: Respiratory complications will improve Outcome: Not Met (add Reason)

## 2018-08-02 NOTE — Patient Instructions (Signed)
Please go to [facility] ER I called and gave them your name but PLEASE tell them that you are on Chemo and need to be separated as much as possible Keep your mask on and don't touch anything (if possible!) My concern is the crackles on the R side, the low oxygen, and the increased heart rate- I'm concerned for pneumonia Call with any questions or concerns Hang in there!!!

## 2018-08-02 NOTE — ED Notes (Signed)
ED TO INPATIENT HANDOFF REPORT  ED Nurse Name and Phone #: Herbie Baltimore [phone] S Name/Age/Gender [name] 63 y.o. female Room/Bed: 038C/038C  Code Status   Code Status: Full Code  Home/SNF/Other Home Patient oriented to: self/place/time/situation Is this baseline? yes  Triage Complete: Triage complete  Chief Complaint Pneumonia/has metatstatic breast cancer  Triage Note Pt sent by PCP for increased shortness of breath. Shortness of breath worse with exertion. Currently receiving chemo treatment for metastatic breast cancer.    Allergies Allergies  Allergen Reactions  . Penicillins Hives, Itching, Rash and Other (See Comments)    Patient has previously tolerated cefazolin, ceftriaxone, and cefepime  PATIENT HAS HAD A PCN REACTION WITH IMMEDIATE RASH, FACIAL/TONGUE/THROAT SWELLING, SOB, OR LIGHTHEADEDNESS WITH HYPOTENSION:  #  #  YES  #  #  Has patient had a PCN reaction causing severe rash involving mucus membranes or skin necrosis: No Has patient had a PCN reaction that required hospitalization No Has patient had a PCN reaction occurring within the last 10 years: yes Denies airway involvement   . Tape Hives and Other (See Comments)    Can tolerate paper and adhesive,  NO MEDIPORE TAPE  . Other Rash and Other (See Comments)    STERI STRIPS - Blisters    Level of Care/Admitting Diagnosis ED Disposition    ED Disposition Condition [facility] Area: [place] [[id number]]  Level of Care: Medical Telemetry [104]  Diagnosis: Sepsis due to undetermined organism [facility]) [[id number]]  Admitting Physician: Reubin Milan [[id number]]  Attending Physician: Reubin Milan [[id number]]  Estimated length of stay: past midnight tomorrow  Certification:: I certify this patient will need inpatient services for at least 2 midnights  PT Class (Do Not Modify): Inpatient [101]  PT Acc Code (Do Not Modify): Private [1]       B Medical/Surgery  History Past Medical History:  Diagnosis Date  . Arthritis   . 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 Heart murmur   . 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 MVP (mitral valve prolapse)    mild mvp w/ mild regurg. per last echo 04-24-2017  . Neuropathy due to chemotherapeutic drug ([place])   . Non-healing surgical wound    right knee post re-implantation total knee arthroplasty 04-2017 unable to completely close surgical incision  . PONV (postoperative nausea and vomiting)    ponv likes scopolamine patch  . Port-A-Cath in place    RIGHT CHEST   . Red blood cell antibody positive   . Skin changes related to chemotherapy    per patient she has scabbed over skin circumventing port to right upper chest ;  state it is due to chemptherapy   .  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 APPLICATION OF A-CELL OF BACK Right 07/31/2016   Procedure: CELLERATE COLLAGEN PLACEMENT;  Surgeon: Loel Lofty Dillingham, DO;  Location: [facility];  Service: Plastics;  Laterality: Right;  . APPLICATION OF WOUND VAC  Right 06/29/2017   Procedure: APPLICATION OF WOUND VAC;  Surgeon: Wallace Going, DO;  Location: [facility];  Service: Plastics;  Laterality: Right;  . DEBRIDEMENT CHEST WALL RIGHT/ VAC PLACEMENT  02-09-2015    [facility]  . EXCISIONAL TOTAL KNEE ARTHROPLASTY WITH ANTIBIOTIC SPACERS Right 12/02/2016   Procedure: EXCISIONAL TOTAL KNEE ARTHROPLASTY WITH ANTIBIOTIC SPACERS;  Surgeon: Paralee Cancel, MD;  [facility];  Service: Orthopedics;  Laterality: Right;  90 mins  . HEMATOMA EVACUATION Right 06/29/2017   Procedure: EVACUATION HEMATOMA OF RIGHT KNEE;  Surgeon: Wallace Going, DO;  [facility];  Service: Plastics;  Laterality: Right;  . HEMATOMA EVACUATION Right 06/29/2017   Procedure: EVACUATION RIGHT TOTAL KNEE HEMATOMA;  Surgeon: Paralee Cancel, MD;  [facility];  Service: Orthopedics;  Laterality: Right;  . HEMATOMA EVACUATION Right 07/14/2017   Procedure: EVACUATION RIGHT LEG HEMATOMA WITH APPLICATION OF WOUND VAC;  Surgeon: Paralee Cancel, MD;  [facility];  Service: Orthopedics;  Laterality: Right;  . hemotoma     evacuation left chest wall  . I&D EXTREMITY Right 07/17/2017   Procedure: EVACUATION RIGHT LEG HEMATOMA WITH WOUND VAC DRESSING CHANGE;  Surgeon: Paralee Cancel, MD;  [facility];  Service: Orthopedics;  Laterality: Right;  . I&D KNEE WITH POLY EXCHANGE Right 05/28/2016   Procedure: IRRIGATION AND DEBRIDEMENT KNEE WOUND VAC PLACMENT;  Surgeon: Susa Day, MD;  [facility];  Service: Orthopedics;  Laterality: Right;  . I&D KNEE WITH POLY EXCHANGE Right 05/30/2016   Procedure: RADICAL SYNOVECTOMY,IRRIGATION AND DEBRIDEMENT KNEE WITH POLY EXCHANGE WITH ANTIBIOTIC BEADS, APPLICATION OF WOUND VAC;  Surgeon: Susa Day, MD;  [facility];  Service: Orthopedics;  Laterality: Right;  . INCISION AND DRAINAGE OF WOUND Right 07/31/2016   Procedure: IRRIGATION AND DEBRIDEMENT RIGHT KNEE  WOUND;  Surgeon: Loel Lofty Dillingham, DO;  Location: [place];  Service: Plastics;   Laterality: Right;  . IR FLUORO GUIDE CV LINE LEFT  04/30/2017  . IR US GUIDE VASC ACCESS LEFT  04/30/2017  . IRRIGATION AND DEBRIDEMENT KNEE Right 04/12/2016   Procedure: IRRIGATION AND DEBRIDEMENT KNEE;  Surgeon: Nicholes Stairs, MD;  [facility];  Service: Orthopedics;  Laterality: Right;  . IRRIGATION AND DEBRIDEMENT KNEE Right 12/16/2016   Procedure: Repeat irrigation and debridement right knee, wound closure  wound vac REPLACEMENT ANTIBIOTIC SPACERS;  Surgeon: Paralee Cancel, MD;  [facility];  Service: Orthopedics;  Laterality: Right;  . KNEE ARTHROSCOPY  02/13/2012   Procedure: ARTHROSCOPY KNEE;  Surgeon: Johnn Hai, MD;  Location[facility];  Service: Orthopedics;  Laterality: Left;  WITH DEBRIDEMENt   . KNEE ARTHROSCOPY WITH LATERAL MENISECTOMY  02/13/2012   Procedure: KNEE ARTHROSCOPY WITH LATERAL MENISECTOMY;  Surgeon: Johnn Hai, MD;  Location: [facility];  Service: Orthopedics;;  partial  . LATISSIMUS FLAP RIGHT CHEST  02-23-2015   [facility]  . LEFT MODIFIED RADICAL MASTECTOMY/ RIGHT TOTAL MASTECTOMY  11-13-2007   LEFT BREAST CANCER W/ AXILLARY LYMPH NODE METASTASIS AND POST NEOADJUVANT CHEMO  . MASS EXCISION Right  06/29/2017   Procedure: EXCISION OF METASTATIC BREAST CANCER TO SKIN RIGHT SHOULDER, PLACEMENT OF CELLERATE;  Surgeon: Wallace Going, DO;  [facility];  Service: Plastics;  Laterality: Right;  . PLACEMENT PORT-A-CATH  06/22/2007    new port placed 2015, right chest  . REIMPLANTATION OF TOTAL KNEE Right 04/27/2017   Procedure: Reimplantation of right total knee arthroplasty;  Surgeon: Paralee Cancel, MD;  [facility];  Service: Orthopedics;  Laterality: Right;  Adductor Block  . RESECTION OF ISOLATED CHEST WALL DISEASE/ ABDOMINAL SKIN FLAP  02/ 2015     [facility]   RIGHT CHEST WALL METASTATIC SKIN CARCINOMA  . RIGHT HEART CATH N/A 01/27/2018   Procedure: RIGHT HEART CATH;  Surgeon: Jolaine Artist, MD;  Location:  [facility];  Service: Cardiovascular;  Laterality: N/A;  . SKIN SPLIT GRAFT Right 01/29/2017   Procedure: SKIN GRAFT SPLIT THICKNESS TO RIGHT KNEE WOUND;  Surgeon: Wallace Going, DO;  [facility];  Service: Plastics;  Laterality: Right;  . SKIN SPLIT GRAFT Right 09/07/2017   Procedure: SKIN GRAFT SPLIT THICKNESS TO RIGHT KNEE WOUND WITH PLACEMENT OF VAC DRESSING TO GRAFT SITE;  Surgeon: Wallace Going, DO;  Location: [facility];  Service: Plastics;  Laterality: Right;  . TOTAL KNEE ARTHROPLASTY Left 09/23/2012   Procedure: LEFT TOTAL KNEE ARTHROPLASTY;  Surgeon: Johnn Hai, MD;  [facility];  Service: Orthopedics;  Laterality: Left;  . TOTAL KNEE ARTHROPLASTY Right 03/20/2016   Procedure: RIGHT TOTAL KNEE ARTHROPLASTY;  Surgeon: Susa Day, MD;  [facility];  Service: Orthopedics;  Laterality: Right;  . TRANSTHORACIC ECHOCARDIOGRAM  04/24/2017   ef 31-49%, grade 1 diastolic dysfuction/  mild MR with mild prolapse anterior leaflet/ mild TR/ PASP 57mmHg     A IV Location/Drains/Wounds Patient Lines/Drains/Airways Status   Active Line/Drains/Airways    Name:   Placement date:   Placement time:   Site:   Days:   Implanted Port  Right Chest   -    -    Chest      Peripheral IV 08/02/18 Right Other (Comment)   08/02/18    1756    Other (Comment)   less than 1   Peripheral IV 08/02/18 Right;Anterior Forearm   08/02/18    1946    Forearm   less than 1   PICC Single Lumen 70/26/37 PICC Left Basilic 43 cm   85/88/50    2774    Basilic   128   Negative Pressure Wound Therapy Knee Right   05/30/16    1700    -   794   Negative Pressure Wound Therapy Knee Right   12/16/16    1320    -   594   Negative Pressure Wound Therapy Knee Right   01/29/17    0751    -   550   Negative Pressure Wound Therapy Knee Right   04/27/17    1455    -   462   Negative Pressure Wound Therapy Knee Right   07/14/17    1410    -   384   Incision (Closed) 04/12/16 Leg  Other (Comment)   04/12/16    1518     842   Incision (Closed) 05/28/16 Knee   05/28/16    1437     796   Incision (Closed) 05/30/16 Knee Right   05/30/16    1635     794  Incision (Closed) 07/31/16 Knee Right   07/31/16    1602     732   Incision (Closed) 12/02/16 Knee Right   12/02/16    1431     608   Incision (Closed) 12/16/16 Knee Right   12/16/16    1332     594   Incision (Closed) 01/29/17 Leg Right   01/29/17    0809     550   Incision (Closed) 01/29/17 Hip Right   01/29/17    0809     550   Incision (Closed) 04/27/17 Knee Right   04/27/17    1432     462   Incision (Closed) 06/29/17 Chest Right   06/29/17    1115     399   Incision (Closed) 07/17/17 Leg Right   07/17/17    1656     381   Incision (Closed) 09/07/17 Leg Right   09/07/17    1032     329   Incision (Closed) 09/07/17 Knee Right   09/07/17    1032     329   Incision (Closed) 03/31/18 Neck Left   03/31/18    1350     124   Wound / Incision (Open or Dehisced) 05/11/16 Other (Comment) Knee Right full thickness distal old incision line    05/11/16    2200    Knee   813   Wound / Incision (Open or Dehisced) 03/31/18 Other (Comment) Chest Lateral;Right;Upper Dressing in place. Pt did not want to remove   03/31/18    1630    Chest   124          Intake/Output Last 24 hours No intake or output data in the 24 hours ending 08/02/18 2114  Labs/Imaging Results for orders placed or performed during the hospital encounter of 08/02/18 (from the past 48 hour(s))  Comprehensive metabolic panel     Status: Abnormal   Collection Time: 08/02/18  5:40 PM  Result Value Ref Range   Sodium 135 135 - 145 mmol/L   Potassium 3.9 3.5 - 5.1 mmol/L   Chloride 105 98 - 111 mmol/L   CO2 20 (L) 22 - 32 mmol/L   Glucose, Bld 98 70 - 99 mg/dL   BUN 14 8 - 23 mg/dL   Creatinine, Ser 0.72 0.44 - 1.00 mg/dL   Calcium 8.4 (L) 8.9 - 10.3 mg/dL   Total Protein 6.0 (L) 6.5 - 8.1 g/dL   Albumin 3.0 (L) 3.5 - 5.0 g/dL   AST 38 15 - 41 U/L   ALT 25 0  - 44 U/L   Alkaline Phosphatase 152 (H) 38 - 126 U/L   Total Bilirubin 1.0 0.3 - 1.2 mg/dL   GFR calc non Af Amer >60 >60 mL/min   GFR calc Af Amer >60 >60 mL/min   Anion gap 10 5 - 15    Comment: Performed at [facility], [address] [place], [place] [address]  CBC WITH DIFFERENTIAL     Status: Abnormal   Collection Time: 08/02/18  5:40 PM  Result Value Ref Range   WBC 4.4 4.0 - 10.5 K/uL   RBC 3.70 (L) 3.87 - 5.11 MIL/uL   Hemoglobin 11.3 (L) 12.0 - 15.0 g/dL   HCT 36.9 36.0 - 46.0 %   MCV 99.7 80.0 - 100.0 fL   MCH 30.5 26.0 - 34.0 pg   MCHC 30.6 30.0 - 36.0 g/dL   RDW 18.5 (H) 11.5 - 15.5 %  Platelets 136 (L) 150 - 400 K/uL   nRBC 0.0 0.0 - 0.2 %   Neutrophils Relative % 74 %   Neutro Abs 3.4 1.7 - 7.7 K/uL   Lymphocytes Relative 13 %   Lymphs Abs 0.6 (L) 0.7 - 4.0 K/uL   Monocytes Relative 9 %   Monocytes Absolute 0.4 0.1 - 1.0 K/uL   Eosinophils Relative 2 %   Eosinophils Absolute 0.1 0.0 - 0.5 K/uL   Basophils Relative 1 %   Basophils Absolute 0.0 0.0 - 0.1 K/uL   Immature Granulocytes 1 %   Abs Immature Granulocytes 0.03 0.00 - 0.07 K/uL    Comment: Performed at [facility] [address], [place], [place] [address]  Influenza panel by PCR (type A & B)     Status: None   Collection Time: 08/02/18  5:45 PM  Result Value Ref Range   Influenza A By PCR NEGATIVE NEGATIVE   Influenza B By PCR NEGATIVE NEGATIVE    Comment: (NOTE) The Xpert Xpress Flu assay is intended as an aid in the diagnosis of  influenza and should not be used as a sole basis for treatment.  This  assay is FDA approved for nasopharyngeal swab specimens only. Nasal  washings and aspirates are unacceptable for Xpert Xpress Flu testing. Performed at [facility], [place] [address], [place], [place] [address]   Lactic acid, plasma     Status: None   Collection Time: 08/02/18  6:05 PM  Result Value Ref Range   Lactic Acid, Venous 1.2 0.5 - 1.9 mmol/L    Comment: Performed at  [facility] [address] [place], [place] [address]   Dg Chest 2 View  Result Date: 08/02/2018 CLINICAL DATA:  Shortness of breath.  Productive cough and fever. EXAM: CHEST - 2 VIEW COMPARISON:  Chest x-ray Oct 16, 2017 and CT scan May 07, 2017 FINDINGS: Superior retraction of the prominent left hilum is stable. The heart, hila, mediastinum, and pleura are unchanged. The right Port-A-Cath is stable terminating in the central SVC. No pulmonary nodules or masses. No focal infiltrates. No overt edema. IMPRESSION: No interval change. Electronically Signed   By: Dorise Bullion III M.D   On: 08/02/2018 18:30    Pending Labs Unresulted Labs (From admission, onward)    Start     Ordered   08/03/18 0500  CBC with Differential  Daily,   R     08/02/18 2037   08/03/18 0500  Comprehensive metabolic panel  Tomorrow morning,   R     08/02/18 2037   08/02/18 2044  Magnesium  Add-on,   R     08/02/18 2043   08/02/18 2044  Phosphorus  Add-on,   R     08/02/18 2043   08/02/18 2044  Respiratory Panel by PCR  (Respiratory virus panel with precautions)  Once,   R     08/02/18 2044   08/02/18 2013  Culture, sputum-assessment  Once,   R    Question:  Patient immune status  Answer:  Immunocompromised   08/02/18 2037   08/02/18 2013  Gram stain  Once,   R    Question:  Patient immune status  Answer:  Immunocompromised   08/02/18 2037   08/02/18 2013  Strep pneumoniae urinary antigen  Once,   R     08/02/18 2037   08/02/18 1746  MRSA PCR Screening  ONCE - STAT,   R  08/02/18 1745   08/02/18 1720  Lactic acid, plasma  Now then every 2 hours,   STAT     08/02/18 1721   08/02/18 1720  Blood Culture (routine x 2)  BLOOD CULTURE X 2,   STAT     08/02/18 1721   08/02/18 1720  Urinalysis, Routine w reflex microscopic  ONCE - STAT,   STAT     08/02/18 1721          Vitals/Pain Today's Vitals   08/02/18 1930 08/02/18 1931 08/02/18 1931 08/02/18 2039  BP: 130/73 130/73  139/68  Pulse: (!)  107 (!) 105  97  Resp:  16  16  Temp:      TempSrc:      SpO2: 98% 98%    PainSc:  0-No pain 0-No pain 0-No pain    Isolation Precautions Droplet precaution  Medications Medications  azithromycin (ZITHROMAX) 500 mg in sodium chloride 0.9 % 250 mL IVPB (0 mg Intravenous Stopped 08/02/18 1902)  vancomycin (VANCOCIN) 2,000 mg in sodium chloride 0.9 % 500 mL IVPB (2,000 mg Intravenous New Bag/Given 08/02/18 1923)  levalbuterol (XOPENEX) nebulizer solution 1.25 mg (has no administration in time range)  ipratropium (ATROVENT) nebulizer solution 0.5 mg (has no administration in time range)  acetaminophen (TYLENOL) tablet 650 mg (650 mg Oral Given 08/02/18 2111)  ondansetron (ZOFRAN) tablet 4 mg (has no administration in time range)    Or  ondansetron (ZOFRAN) injection 4 mg (has no administration in time range)  0.9 % NaCl with KCl 20 mEq/ L  infusion (has no administration in time range)  ceFEPIme (MAXIPIME) 2 g in sodium chloride 0.9 % 100 mL IVPB (0 g Intravenous Stopped 08/02/18 2103)  ipratropium-albuterol (DUONEB) 0.5-2.5 (3) MG/3ML nebulizer solution 3 mL (3 mLs Nebulization Given 08/02/18 1930)    Mobility walks Low fall risk   Focused Assessments FAMILY AT BEDSIDE , O2 SAT= 100% 2 LPM/[place]   R Recommendations: See Admitting Provider Note  Report given to:   Additional Notes: RESPIRATIONS UNLABORED AT THIS TIME/ DENIES PAIN , VANCOMYCIN IV INFUSING

## 2018-08-02 NOTE — ED Provider Notes (Signed)
[facility] EMERGENCY DEPARTMENT Provider Note   CSN: [id number] Arrival date & time: 08/02/18  1649    History   Chief Complaint Chief Complaint  Patient presents with  . Shortness of Breath    HPI [name] is a 63 y.o. female.     HPI Patient presents from primary care's office with possible pneumonia.  Presents with cough for the last 4 days.  Has had fevers for the last 3 days.  Up to 103.  She has metastatic breast cancer is on chemotherapy and is post infusion by 10 days.  Saw primary care doctor today and had some hypoxia.  Reported sats down to 85 with ambulation.  Has been at [facility] for outpatient testing but has not been inpatient.  No known definite flu contacts.  Has had some clear sputum production. Past Medical History:  Diagnosis Date  . Arthritis   . Breast cancer metastasized to skin, right [facility] followed by dr force -- oncolgoist w/ [place]   primary left breast cancer dx 01/ 2009 ; 11/ 2009 recurrence left chest wall and neck, tx clinical trial drug and chemotherapy;  2015 recurrence cutaneous metastatized to right skin/ chest wall, 02/ 2015 resection chest wall disease and 02-23-2015 s/p skin flap surgery,  chemo every 3 weeks  . 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 Heart murmur   . 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 MVP (mitral valve prolapse)    mild mvp w/ mild regurg. per last echo 04-24-2017  . Neuropathy due to chemotherapeutic drug ([place])   . Non-healing surgical wound    right knee post re-implantation total knee arthroplasty 04-2017 unable to completely close surgical  incision  . PONV (postoperative nausea and vomiting)    ponv likes scopolamine patch  . Port-A-Cath in place    RIGHT CHEST   . Red blood cell antibody positive   . Skin changes related to chemotherapy    per patient she has scabbed over skin circumventing port to right upper chest ;  state it is due to chemptherapy   . 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 Sepsis due to undetermined organism ([place]) 08/02/2018  . Vocal cord paralysis 03/31/2018  . Chronic left shoulder pain 01/15/2018  . Wound, open with complication 45/80/9983  . Anemia due to chemotherapy 05/07/2017  . Abnormal LFTs 05/07/2017  . History of breast cancer   . Bilateral leg edema   . S/P revision of total knee, right 04/27/2017  . Physical exam 07/31/2016  . S/P total knee arthroplasty, right 05/31/2016  . 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 Wound dehiscence, surgical 04/12/2016  . 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 Vitamin D deficiency 01/25/2016  . Metastatic cancer to chest wall ([place]) 02/02/2015  . Primary osteoarthritis of right knee 02/01/2015  . Symptomatic anemia 02/01/2015  . Frequent UTI 01/12/2014  . Sinus tachycardia 11/03/2013  . SOB (shortness  of breath) 11/03/2013  . Neuropathy due to chemotherapeutic drug ([place]) 07/25/2011  . 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 Metastatic breast cancer ([place]) 11/12/2010  . Chronic anticoagulation 11/12/2010  . Anxiety, generalized 03/26/2008    Past Surgical History:  Procedure Laterality Date  . APPLICATION OF A-CELL OF BACK Right 07/31/2016   Procedure: CELLERATE COLLAGEN PLACEMENT;  Surgeon: Loel Lofty Dillingham, DO;  Location: [facility];  Service: Plastics;  Laterality: Right;  . APPLICATION OF WOUND VAC Right 06/29/2017   Procedure: APPLICATION OF WOUND VAC;  Surgeon: Wallace Going, DO;  Location[facility];  Service: Plastics;  Laterality: Right;  . DEBRIDEMENT CHEST WALL RIGHT/ VAC PLACEMENT  02-09-2015    [facility]  . EXCISIONAL TOTAL KNEE ARTHROPLASTY WITH ANTIBIOTIC SPACERS Right 12/02/2016   Procedure: EXCISIONAL TOTAL KNEE ARTHROPLASTY WITH ANTIBIOTIC SPACERS;  Surgeon: Paralee Cancel, MD;  [facility];  Service: Orthopedics;  Laterality: Right;  90 mins  . HEMATOMA EVACUATION Right 06/29/2017   Procedure: EVACUATION HEMATOMA OF RIGHT KNEE;  Surgeon: Wallace Going, DO;  [facility];  Service: Plastics;  Laterality: Right;  . HEMATOMA EVACUATION Right 06/29/2017   Procedure: EVACUATION RIGHT TOTAL KNEE HEMATOMA;  Surgeon: Paralee Cancel, MD;  [facility];  Service: Orthopedics;  Laterality: Right;  . HEMATOMA EVACUATION Right 07/14/2017   Procedure: EVACUATION RIGHT LEG HEMATOMA WITH APPLICATION OF WOUND VAC;  Surgeon: Paralee Cancel, MD;  [facility];  Service: Orthopedics;  Laterality: Right;  . hemotoma     evacuation left chest wall  . I&D EXTREMITY Right 07/17/2017   Procedure: EVACUATION RIGHT LEG HEMATOMA WITH WOUND VAC DRESSING CHANGE;  Surgeon: Paralee Cancel, MD;  [facility];  Service: Orthopedics;  Laterality: Right;  . I&D KNEE WITH POLY EXCHANGE Right 05/28/2016   Procedure: IRRIGATION AND DEBRIDEMENT KNEE WOUND VAC PLACMENT;  Surgeon: Susa Day, MD;  [facility];  Service: Orthopedics;  Laterality: Right;  . I&D KNEE WITH POLY EXCHANGE Right 05/30/2016   Procedure: RADICAL SYNOVECTOMY,IRRIGATION AND DEBRIDEMENT KNEE WITH POLY EXCHANGE WITH ANTIBIOTIC BEADS, APPLICATION OF WOUND VAC;  Surgeon: Susa Day, MD;  [facility];  Service: Orthopedics;  Laterality: Right;  . INCISION AND DRAINAGE OF WOUND Right 07/31/2016   Procedure: IRRIGATION AND DEBRIDEMENT RIGHT KNEE  WOUND;  Surgeon: Loel Lofty Dillingham, DO;  Location: [facility];  Service: Plastics;  Laterality: Right;  . IR FLUORO GUIDE CV LINE LEFT  04/30/2017  . IR US GUIDE  VASC ACCESS LEFT  04/30/2017  . IRRIGATION AND DEBRIDEMENT KNEE Right 04/12/2016   Procedure: IRRIGATION AND DEBRIDEMENT KNEE;  Surgeon: Nicholes Stairs, MD;  [facility];  Service: Orthopedics;  Laterality: Right;  . IRRIGATION AND DEBRIDEMENT KNEE Right 12/16/2016   Procedure: Repeat irrigation and debridement right knee, wound closure  wound vac REPLACEMENT ANTIBIOTIC SPACERS;  Surgeon: Paralee Cancel, MD;  [facility];  Service: Orthopedics;  Laterality: Right;  . KNEE ARTHROSCOPY  02/13/2012   Procedure: ARTHROSCOPY KNEE;  Surgeon: Johnn Hai, MD;  Location: [facility];  Service: Orthopedics;  Laterality: Left;  WITH DEBRIDEMENt   . KNEE ARTHROSCOPY WITH LATERAL MENISECTOMY  02/13/2012   Procedure: KNEE ARTHROSCOPY WITH LATERAL MENISECTOMY;  Surgeon: Johnn Hai, MD;  Location: [facility];  Service: Orthopedics;;  partial  . LATISSIMUS FLAP RIGHT CHEST  02-23-2015   [facility]  . LEFT MODIFIED RADICAL MASTECTOMY/ RIGHT TOTAL MASTECTOMY  11-13-2007   LEFT BREAST CANCER W/ AXILLARY LYMPH NODE METASTASIS AND POST  NEOADJUVANT CHEMO  . MASS EXCISION Right 06/29/2017   Procedure: EXCISION OF METASTATIC BREAST CANCER TO SKIN RIGHT SHOULDER, PLACEMENT OF CELLERATE;  Surgeon: Wallace Going, DO;  [facility];  Service: Plastics;  Laterality: Right;  . PLACEMENT PORT-A-CATH  06/22/2007    new port placed 2015, right chest  . REIMPLANTATION OF TOTAL KNEE Right 04/27/2017   Procedure: Reimplantation of right total knee arthroplasty;  Surgeon: Paralee Cancel, MD;  [facility];  Service: Orthopedics;  Laterality: Right;  Adductor Block  . RESECTION OF ISOLATED CHEST WALL DISEASE/ ABDOMINAL SKIN FLAP  02/ 2015     [facility]   RIGHT CHEST WALL METASTATIC SKIN CARCINOMA  . RIGHT HEART CATH N/A 01/27/2018   Procedure: RIGHT HEART CATH;  Surgeon: Jolaine Artist, MD;  Location: [facility];  Service: Cardiovascular;  Laterality: N/A;  . SKIN SPLIT  GRAFT Right 01/29/2017   Procedure: SKIN GRAFT SPLIT THICKNESS TO RIGHT KNEE WOUND;  Surgeon: Wallace Going, DO;  [facility];  Service: Plastics;  Laterality: Right;  . SKIN SPLIT GRAFT Right 09/07/2017   Procedure: SKIN GRAFT SPLIT THICKNESS TO RIGHT KNEE WOUND WITH PLACEMENT OF VAC DRESSING TO GRAFT SITE;  Surgeon: Wallace Going, DO;  Location: [facility];  Service: Plastics;  Laterality: Right;  . TOTAL KNEE ARTHROPLASTY Left 09/23/2012   Procedure: LEFT TOTAL KNEE ARTHROPLASTY;  Surgeon: Johnn Hai, MD;  [facility];  Service: Orthopedics;  Laterality: Left;  . TOTAL KNEE ARTHROPLASTY Right 03/20/2016   Procedure: RIGHT TOTAL KNEE ARTHROPLASTY;  Surgeon: Susa Day, MD;  [facility];  Service: Orthopedics;  Laterality: Right;  . TRANSTHORACIC ECHOCARDIOGRAM  04/24/2017   ef 61-60%, grade 1 diastolic dysfuction/  mild MR with mild prolapse anterior leaflet/ mild TR/ PASP 31mmHg     OB History   No obstetric history on file.      Home Medications    Prior to Admission medications   Medication Sig Start Date End Date Taking? Authorizing Provider  acetaminophen (TYLENOL) 500 MG tablet Take 500 mg by mouth every 8 (eight) hours as needed for mild pain or headache.    [provider]  apixaban (ELIQUIS) 5 MG TABS tablet Take 1 tablet (5 mg total) by mouth 2 (two) times daily. 04/02/18   Midge Minium, MD  celecoxib (CELEBREX) 200 MG capsule Take 1 capsule (200 mg total) by mouth 2 (two) times daily as needed for moderate pain. 06/11/18   Colon Branch, MD  cetirizine (ZYRTEC) 10 MG tablet Take 1 tablet (10 mg total) daily by mouth. 04/03/17   Midge Minium, MD  Cholecalciferol (VITAMIN D3) 2000 UNITS TABS Take 2,000 Units by mouth 4 (four) times a week.     [provider]  diphenoxylate-atropine (LOMOTIL) 2.5-0.025 MG tablet Take 1 tablet by mouth 4 (four) times daily as needed for diarrhea or loose stools.  09/12/17    [provider]  doxycycline (MONODOX) 100 MG capsule Take 100 mg by mouth 2 (two) times daily. 01/28/18   [provider]  gabapentin (NEURONTIN) 300 MG capsule Take 1 capsule (300 mg total) by mouth 3 (three) times daily. 01/12/18   Midge Minium, MD  mirabegron ER (MYRBETRIQ) 25 MG TB24 tablet Take 1 tablet (25 mg total) by mouth daily. 02/08/18   Midge Minium, MD  ondansetron (ZOFRAN) 8 MG tablet TAKE 1 TABLET BY MOUTH EVERY 8 HOURS AS NEEDED FOR NAUSEA. Patient taking differently:  Take 8 mg by mouth every 8 (eight) hours as needed for nausea or vomiting.  11/06/17   Midge Minium, MD  prochlorperazine (COMPAZINE) 10 MG tablet Take 10 mg by mouth every 6 (six) hours as needed for nausea or vomiting.  09/12/17   [provider]  sildenafil (REVATIO) 20 MG tablet Take 2 tablets (40 mg total) by mouth 3 (three) times daily. 05/27/18   Bensimhon, Shaune Pascal, MD  Trastuzumab (HERCEPTIN IV) Inject 1 Syringe into the vein every 21 ( twenty-one) days.     [provider]  zolpidem (AMBIEN) 5 MG tablet TAKE 1 TABLET BY MOUTH ONCE DAILY AT BEDTIME AS NEEDED FOR SLEEP Patient taking differently: Take 5 mg by mouth at bedtime as needed for sleep.  09/17/17   Brunetta Jeans, PA-C    Family History Family History  Problem Relation Age of Onset  . Heart disease Mother        due to mitral valve regurgiation,   . Cancer Mother        breast  . Allergic rhinitis Mother   . Parkinsonism Father   . Allergic rhinitis Father   . Migraines Father   . Alcohol abuse Paternal Grandfather   . Angioedema Neg Hx   . Asthma Neg Hx   . Eczema Neg Hx     Social History Social History   Tobacco Use  . Smoking status: Never Smoker  . Smokeless tobacco: Never Used  Substance Use Topics  . Alcohol use: No  . Drug use: No     Allergies   Penicillins; Tape; and Other   Review of Systems Review of Systems  Constitutional: Positive for appetite change,  fatigue and fever.  HENT: Negative for congestion.   Respiratory: Positive for cough, shortness of breath and wheezing.   Cardiovascular: Negative for chest pain.  Gastrointestinal: Negative for abdominal pain.  Endocrine: Negative for polyuria.  Genitourinary: Negative for dysuria.  Musculoskeletal: Negative for back pain.  Skin: Positive for wound.  Neurological: Positive for weakness.  Psychiatric/Behavioral: Negative for confusion.     Physical Exam Updated Vital Signs BP 130/73   Pulse (!) 105   Temp (!) 100.6 F (38.1 C) (Oral)   Resp 16   SpO2 98%   Physical Exam Vitals signs and nursing note reviewed.  HENT:     Head: Normocephalic.     Mouth/Throat:     Pharynx: No oropharyngeal exudate.  Neck:     Musculoskeletal: Neck supple.  Cardiovascular:     Rate and Rhythm: Tachycardia present.  Pulmonary:     Comments: Diffuse harsh breath sounds with some wheezes.  Worse in right base. Chest:     Comments: Skin changes to right upper chest wall from cancer. Abdominal:     Tenderness: There is no abdominal tenderness.  Musculoskeletal:     Right lower leg: No edema.     Left lower leg: No edema.  Skin:    General: Skin is warm.     Capillary Refill: Capillary refill takes less than 2 seconds.  Neurological:     General: No focal deficit present.     Mental Status: She is alert.      ED Treatments / Results  Labs (all labs ordered are listed, but only abnormal results are displayed) Labs Reviewed  COMPREHENSIVE METABOLIC PANEL - Abnormal; Notable for the following components:      Result Value   CO2 20 (*)    Calcium 8.4 (*)  Total Protein 6.0 (*)    Albumin 3.0 (*)    Alkaline Phosphatase 152 (*)    All other components within normal limits  CBC WITH DIFFERENTIAL/PLATELET - Abnormal; Notable for the following components:   RBC 3.70 (*)    Hemoglobin 11.3 (*)    RDW 18.5 (*)    Platelets 136 (*)    Lymphs Abs 0.6 (*)    All other components  within normal limits  CULTURE, BLOOD (ROUTINE X 2)  CULTURE, BLOOD (ROUTINE X 2)  MRSA PCR SCREENING  LACTIC ACID, PLASMA  INFLUENZA PANEL BY PCR (TYPE A & B)  LACTIC ACID, PLASMA  URINALYSIS, ROUTINE W REFLEX MICROSCOPIC    EKG EKG Interpretation  Date/Time:  Monday August 02 2018 17:34:11 EDT Ventricular Rate:  109 PR Interval:    QRS Duration: 91 QT Interval:  342 QTC Calculation: 461 R Axis:   -104 Text Interpretation:  Sinus tachycardia Markedly posterior QRS axis Confirmed by Davonna Belling [phone]) on 08/02/2018 8:09:26 PM   Radiology Dg Chest 2 View  Result Date: 08/02/2018 CLINICAL DATA:  Shortness of breath.  Productive cough and fever. EXAM: CHEST - 2 VIEW COMPARISON:  Chest x-ray Oct 16, 2017 and CT scan May 07, 2017 FINDINGS: Superior retraction of the prominent left hilum is stable. The heart, hila, mediastinum, and pleura are unchanged. The right Port-A-Cath is stable terminating in the central SVC. No pulmonary nodules or masses. No focal infiltrates. No overt edema. IMPRESSION: No interval change. Electronically Signed   By: Dorise Bullion III M.D   On: 08/02/2018 18:30    Procedures Procedures (including critical care time)  Medications Ordered in ED Medications  azithromycin (ZITHROMAX) 500 mg in sodium chloride 0.9 % 250 mL IVPB (0 mg Intravenous Stopped 08/02/18 1902)  vancomycin (VANCOCIN) 2,000 mg in sodium chloride 0.9 % 500 mL IVPB (2,000 mg Intravenous New Bag/Given 08/02/18 1923)  ceFEPIme (MAXIPIME) 2 g in sodium chloride 0.9 % 100 mL IVPB (2 g Intravenous New Bag/Given 08/02/18 1926)  ipratropium-albuterol (DUONEB) 0.5-2.5 (3) MG/3ML nebulizer solution 3 mL (3 mLs Nebulization Given 08/02/18 1930)     Initial Impression / Assessment and Plan / ED Course  I have reviewed the triage vital signs and the nursing notes.  Pertinent labs & imaging results that were available during my care of the patient were reviewed by me and considered in my medical  decision making (see chart for details).        Patient presents with respiratory symptoms fever coughing shortness of breath.  Sats will reportedly go down to 85 with ambulation.  Does fine on nasal cannula oxygen but does not normally have an oxygen requirement.  X-ray reassuring.  She is already on anticoagulation for previous pulmonary embolism and I think this more likely infectious.  Flu still pending.  However empirically given antibiotics to cover potentially higher risk organisms with her chemotherapy and recent medical treatments.  Will admit to hospitalist.  Final Clinical Impressions(s) / ED Diagnoses   Final diagnoses:  Pneumonia due to infectious organism, unspecified laterality, unspecified part of lung    ED Discharge Orders    None       Davonna Belling, MD 08/02/18 2019

## 2018-08-02 NOTE — ED Notes (Signed)
Pt ambulatory to bathroom with no reported issues. 

## 2018-08-02 NOTE — ED Triage Notes (Signed)
Pt sent by PCP for increased shortness of breath. Shortness of breath worse with exertion. Currently receiving chemo treatment for metastatic breast cancer.

## 2018-08-02 NOTE — Progress Notes (Signed)
   Subjective:    Patient ID: [name], female    DOB: 1956/03/16, 62 y.o.   MRN: [id number]  HPI URI- went to [facility] on Thursday to enroll in research study.  Developed sore throat by end of day.  Then developed nasal congestion, drainage.  + cough- 'cloudy stuff'.  R ear pain.  + SOB.  Tm 103.  Currently on chemo- last tx 10 days ago.  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  Hx of pulmonary HTN   Review of Systems For ROS see HPI     Objective:   Physical Exam Vitals signs reviewed.  Constitutional:      Appearance: She is ill-appearing. She is not toxic-appearing.  HENT:     Head: Normocephalic and atraumatic.     Right Ear: Tympanic membrane normal.     Left Ear: Tympanic membrane normal.     Nose: Congestion present.     Mouth/Throat:     Mouth: Mucous membranes are moist.     Pharynx: No oropharyngeal exudate or posterior oropharyngeal erythema.  Neck:     Musculoskeletal: Neck supple.  Cardiovascular:     Rate and Rhythm: Regular rhythm. Tachycardia present.  Pulmonary:     Effort: No respiratory distress.     Breath sounds: No stridor. No wheezing or rhonchi.     Comments: Crackles over RML and RLL w/ wet, productive cough Neurological:     General: No focal deficit present.     Mental Status: She is alert and oriented to person, place, and time.  Psychiatric:        Mood and Affect: Mood normal.        Behavior: Behavior normal.           Assessment & Plan:  Hypoxia/Tachycardia/R lung crackles/Immunosuppressed- pt is at high risk for CAP/HAP (she spent the day at [facility] on Thursday) given her current chemo regimen and immunosuppressed state.  Crackles on R would support this.  Given her hypoxia w/ exertion and tachycardia this needs to be assessed and possibly treated as inpt.  Pt and daughter are in agreement and will go to [facility] ER.  Called and spoke w/ Charge Nurse and Intake Nurse asking them to separate pt from general waiting room  given her immunosuppression.  Pt expressed understanding and is in agreement w/ plan.

## 2018-08-02 NOTE — Progress Notes (Signed)
Pharmacy Antibiotic Note  [name] is a 63 y.o. female admitted on 08/02/2018 with pneumonia.  Pharmacy has been consulted for vancomycin, cefepime, and azithromycin dosing. Patient currently on chemotherapy PTA. On admission Tmax 100.6, RR 27, HR 115.   Plan: Azithromycin 500 mg IV q24h Cefepime 2g IV q8h Vancomycin 2000 mg IV x 1, then vancomycin 750 mg IV q8h (expected AUC 519, Cmax ss 28, Cmin ss 17 using Scr of 0.72) Obtain vancomycin peak and trough at steady state Monitor Scr, CBC, and clinical progression F/u C&S, de-escalation plans, and LOT    Temp (24hrs), Avg:100.1 F (37.8 C), Min:99.5 F (37.5 C), Max:100.6 F (38.1 C)  No results for input(s): WBC, CREATININE, LATICACIDVEN, VANCOTROUGH, VANCOPEAK, VANCORANDOM, GENTTROUGH, GENTPEAK, GENTRANDOM, TOBRATROUGH, TOBRAPEAK, TOBRARND, AMIKACINPEAK, AMIKACINTROU, AMIKACIN in the last 168 hours.  CrCl cannot be calculated (Patient's most recent lab result is older than the maximum 21 days allowed.).    Allergies  Allergen Reactions  . Penicillins Hives, Itching, Rash and Other (See Comments)    PATIENT HAS HAD A PCN REACTION WITH IMMEDIATE RASH, FACIAL/TONGUE/THROAT SWELLING, SOB, OR LIGHTHEADEDNESS WITH HYPOTENSION:  #  #  YES  #  #  Has patient had a PCN reaction causing severe rash involving mucus membranes or skin necrosis: No Has patient had a PCN reaction that required hospitalization No Has patient had a PCN reaction occurring within the last 10 years: yes Denies airway involvement   . Tape Hives and Other (See Comments)    Can tolerate paper and adhesive,  NO MEDIPORE TAPE  . Other Rash and Other (See Comments)    STERI STRIPS - Blisters    Antimicrobials this admission: Vancomycin 3/9 >>  Cefepime 3/9 >>  Azithromycin 3/9 >>   Dose adjustments this admission: N/A  Microbiology results: 3/9 BCx:  3/9 MRSA PCR:   Thank you for allowing pharmacy to be a part of this patient's care.  Leron Croak,  PharmD PGY1 Pharmacy Resident  Please check AMION for all [facility] Pharmacy phone numbers 08/02/2018 5:29 PM

## 2018-08-03 DIAGNOSIS — T451X5A Adverse effect of antineoplastic and immunosuppressive drugs, initial encounter: Secondary | ICD-10-CM

## 2018-08-03 DIAGNOSIS — D696 Thrombocytopenia, unspecified: Secondary | ICD-10-CM

## 2018-08-03 DIAGNOSIS — R748 Abnormal levels of other serum enzymes: Secondary | ICD-10-CM

## 2018-08-03 DIAGNOSIS — C50919 Malignant neoplasm of unspecified site of unspecified female breast: Secondary | ICD-10-CM

## 2018-08-03 DIAGNOSIS — D6481 Anemia due to antineoplastic chemotherapy: Secondary | ICD-10-CM

## 2018-08-03 LAB — RESPIRATORY PANEL BY PCR
Adenovirus: NOT DETECTED
Bordetella pertussis: NOT DETECTED
CORONAVIRUS NL63-RVPPCR: NOT DETECTED
Chlamydophila pneumoniae: NOT DETECTED
Coronavirus 229E: NOT DETECTED
Coronavirus HKU1: NOT DETECTED
Coronavirus OC43: NOT DETECTED
Influenza A: NOT DETECTED
Influenza B: NOT DETECTED
Metapneumovirus: DETECTED — AB
Mycoplasma pneumoniae: NOT DETECTED
PARAINFLUENZA VIRUS 1-RVPPCR: NOT DETECTED
PARAINFLUENZA VIRUS 2-RVPPCR: NOT DETECTED
PARAINFLUENZA VIRUS 3-RVPPCR: NOT DETECTED
Parainfluenza Virus 4: NOT DETECTED
Respiratory Syncytial Virus: NOT DETECTED
Rhinovirus / Enterovirus: NOT DETECTED

## 2018-08-03 LAB — CBC WITH DIFFERENTIAL/PLATELET
Abs Immature Granulocytes: 0.02 10*3/uL (ref 0.00–0.07)
Basophils Absolute: 0 10*3/uL (ref 0.0–0.1)
Basophils Relative: 0 %
EOS ABS: 0 10*3/uL (ref 0.0–0.5)
Eosinophils Relative: 1 %
HCT: 30.1 % — ABNORMAL LOW (ref 36.0–46.0)
Hemoglobin: 9.7 g/dL — ABNORMAL LOW (ref 12.0–15.0)
Immature Granulocytes: 1 %
Lymphocytes Relative: 11 %
Lymphs Abs: 0.5 10*3/uL — ABNORMAL LOW (ref 0.7–4.0)
MCH: 31.1 pg (ref 26.0–34.0)
MCHC: 32.2 g/dL (ref 30.0–36.0)
MCV: 96.5 fL (ref 80.0–100.0)
Monocytes Absolute: 0.4 10*3/uL (ref 0.1–1.0)
Monocytes Relative: 9 %
Neutro Abs: 3.2 10*3/uL (ref 1.7–7.7)
Neutrophils Relative %: 78 %
Platelets: 123 10*3/uL — ABNORMAL LOW (ref 150–400)
RBC: 3.12 MIL/uL — ABNORMAL LOW (ref 3.87–5.11)
RDW: 18.2 % — ABNORMAL HIGH (ref 11.5–15.5)
WBC: 4.2 10*3/uL (ref 4.0–10.5)
nRBC: 0 % (ref 0.0–0.2)

## 2018-08-03 LAB — COMPREHENSIVE METABOLIC PANEL
ALT: 21 U/L (ref 0–44)
AST: 30 U/L (ref 15–41)
Albumin: 2.6 g/dL — ABNORMAL LOW (ref 3.5–5.0)
Alkaline Phosphatase: 138 U/L — ABNORMAL HIGH (ref 38–126)
Anion gap: 6 (ref 5–15)
BUN: 10 mg/dL (ref 8–23)
CALCIUM: 7.9 mg/dL — AB (ref 8.9–10.3)
CO2: 24 mmol/L (ref 22–32)
Chloride: 109 mmol/L (ref 98–111)
Creatinine, Ser: 0.67 mg/dL (ref 0.44–1.00)
GFR calc Af Amer: >60 mL/min (ref >60)
GFR calc non Af Amer: >60 mL/min (ref >60)
Glucose, Bld: 105 mg/dL — ABNORMAL HIGH (ref 70–99)
Potassium: 3.5 mmol/L (ref 3.5–5.1)
Sodium: 139 mmol/L (ref 135–145)
Total Bilirubin: 0.9 mg/dL (ref 0.3–1.2)
Total Protein: 5.4 g/dL — ABNORMAL LOW (ref 6.5–8.1)

## 2018-08-03 LAB — LACTIC ACID, PLASMA: Lactic Acid, Venous: 0.8 mmol/L (ref 0.5–1.9)

## 2018-08-03 LAB — STREP PNEUMONIAE URINARY ANTIGEN: Strep Pneumo Urinary Antigen: NEGATIVE

## 2018-08-03 LAB — MRSA PCR SCREENING: MRSA by PCR: NEGATIVE

## 2018-08-03 MED ORDER — LEVALBUTEROL HCL 1.25 MG/0.5ML IN NEBU
1.2500 mg | INHALATION_SOLUTION | Freq: Four times a day (QID) | RESPIRATORY_TRACT | Status: DC | PRN
  Administered 2018-08-03: 1.25 mg via RESPIRATORY_TRACT
  Filled 2018-08-03: qty 0.5

## 2018-08-03 MED ORDER — IPRATROPIUM BROMIDE 0.02 % IN SOLN
0.5000 mg | Freq: Four times a day (QID) | RESPIRATORY_TRACT | Status: DC | PRN
  Administered 2018-08-03: 0.5 mg via RESPIRATORY_TRACT
  Filled 2018-08-03 (×2): qty 2.5

## 2018-08-03 MED ORDER — DIPHENHYDRAMINE HCL 50 MG/ML IJ SOLN
25.0000 mg | Freq: Once | INTRAMUSCULAR | Status: AC
  Administered 2018-08-03: 25 mg via INTRAVENOUS
  Filled 2018-08-03: qty 1

## 2018-08-03 NOTE — Progress Notes (Signed)
Pt took off cardiac monitor due to hives presenting at electrode sites. MD notified, ordered benadryl and transferred patient to [facility]

## 2018-08-03 NOTE — Discharge Instructions (Signed)
Information on my medicine - ELIQUIS (apixaban)  This medication education was reviewed with me or my healthcare representative as part of my discharge preparation.   Why was Eliquis prescribed for you? Eliquis was prescribed to treat blood clots that may have been found in the veins (deep vein thrombosis) in the past, and to reduce the risk of them occurring again.  What do You need to know about Eliquis ?  Your current dose is ONE 5 mg tablet taken TWICE daily.  Eliquis may be taken with or without food.   Try to take the dose about the same time in the morning and in the evening. If you have difficulty swallowing the tablet whole please discuss with your pharmacist how to take the medication safely.  Take Eliquis exactly as prescribed and DO NOT stop taking Eliquis without talking to the doctor who prescribed the medication.  Stopping may increase your risk of developing a new blood clot.  Refill your prescription before you run out.  After discharge, you should have regular check-up appointments with your healthcare provider that is prescribing your Eliquis.    What do you do if you miss a dose? If a dose of ELIQUIS is not taken at the scheduled time, take it as soon as possible on the same day and twice-daily administration should be resumed. The dose should not be doubled to make up for a missed dose.  Important Safety Information A possible side effect of Eliquis is bleeding. You should call your healthcare provider right away if you experience any of the following: ? Bleeding from an injury or your nose that does not stop. ? Unusual colored urine (red or dark brown) or unusual colored stools (red or black). ? Unusual bruising for unknown reasons. ? A serious fall or if you hit your head (even if there is no bleeding).  Some medicines may interact with Eliquis and might increase your risk of bleeding or clotting while on Eliquis. To help avoid this, consult your  healthcare provider or pharmacist prior to using any new prescription or non-prescription medications, including herbals, vitamins, non-steroidal anti-inflammatory drugs (NSAIDs) and supplements.  This website has more information on Eliquis (apixaban): http://www.eliquis.com/eliquis/home

## 2018-08-03 NOTE — Progress Notes (Signed)
PROGRESS NOTE    [name]  MPN:[id number] DOB: 05-15-1956 DOA: 08/02/2018 PCP: Midge Minium, MD   Brief Narrative: [name] is a 63 y.o. 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  There is no travel history or known sick contacts.  She says she has some mild right earache.  She denies rhinorrhea, sore throat or hemoptysis.  She gets postinfusion erythema urticaria extending from her side of her Port-A-Cath.  ED Course: Initial temperature was 100.6 F, pulse 110, respirations 18, blood pressure 139/77 mmHg and O2 sat 92% on room air.  She received supplemental oxygen, cefepime, azithromycin and vancomycin . Assessment & Plan:   Principal Problem:   Sepsis due to undetermined organism [facility]) Active Problems:   Metastatic breast cancer ([place])   Anemia due to chemotherapy   Hypomagnesemia   Mild protein malnutrition (HCC)   Elevated alkaline phosphatase level   Thrombocytopenia (HCC)   Pulmonary hypertension ([place])   ##Sepsis due to pneumonia present on admission -Admitted with a fever of 101, lactic acid of 9.5, tachycardia -Suspect developing HCAP considering patient's cough, shortness of breath, hypoxia. -Follow-up with blood, sputum cultures -Continue with vancomycin, cefepime, azithromycin -De-escalate the antibiotics once  culture data is available  ##Pneumonia healthcare associated -Continue the current antibiotic regimen  ##COPD exacerbation -Continue with the duo nebs, Solu-Medrol -Patient states has improvement with cough and shortness of breath from admission - ##  Metastatic breast cancer ([place]) Received chemotherapy about 10 days ago. She was recently at [facility] on 07/29/2018 for clinical trial enrollment. Protective precautions given immunosuppressed state.   ## Pulmonary hypertension ([place]) -Continue Garba to 40 mg p.o. 3 times daily.  ##  Anemia due to chemotherapy -Monitor hematocrit and hemoglobin.  ##  Hypomagnesemia -Replace by IV as oral magnesium is causing diarrhea  ##  Mild protein malnutrition ([place]) Secondary to malignancy and chemotherapy. Consult nutritional services.    ##Elevated alkaline phosphatase level Likely due to chest wall metastasis. Follow-up level as needed.  ##  Thrombocytopenia ([place]) Likely due to chemotherapy. Monitor platelet level.    DVT prophylaxis: Lovenox  code Status: Full code Family Communication: Family members were present.  Discussed with patient disposition Plan: Home in 2 to 3 days   Antimicrobials:  Vancomycin 08/01/2008 -1/7 Cefepime 08/01/2008-1/7 Azithromycin 08/01/2008-1/7  Subjective: Mild improvement with cough and shortness of breath from admission.  Objective: Vitals:   08/03/18 0301 08/03/18 0341 08/03/18 0754 08/03/18 0853  BP:  121/68  (!) 151/81  Pulse: 82   (!) 103  Resp: 14     Temp:  98.6 F (37 C)  99 F (37.2 C)  TempSrc:  Oral  Oral  SpO2: 97% 96% 97% 92%  Weight:      Height:        Intake/Output Summary (Last 24 hours) at 08/03/2018 1314 Last data filed at 08/03/2018 0600 Gross per 24 hour  Intake 1795.83  ml  Output 1600 ml  Net 195.83 ml   Filed Weights   08/02/18 2147  Weight: 86.7 kg    Examination:  General exam: Appears calm and comfortable. Respiratory system: Mildly choirs  breath sounds, occasional wheezing Cardiovascular system: S1 & S2 heard, RRR. No JVD, murmurs, rubs, gallops or clicks. No pedal edema. Gastrointestinal system: Abdomen is nondistended, soft and nontender. No organomegaly or masses felt. Normal bowel sounds heard. Central nervous system: Alert and oriented. No focal neurological deficits. Extremities: Symmetric 5 x 5 power. Skin: Erythema on the chest right more than the left.  Patient states she has baseline erythema no worsening in the last few days  psychiatry: Judgement and insight appear normal. Mood & affect appropriate.     Data Reviewed: I have personally reviewed following labs and imaging studies  CBC: Recent Labs  Lab 08/02/18 1740 08/03/18 0001  WBC 4.4 4.2  NEUTROABS 3.4 3.2  HGB 11.3* 9.7*  HCT 36.9 30.1*  MCV 99.7 96.5  PLT 136* 024*   Basic Metabolic Panel: Recent Labs  Lab 08/02/18 1740 08/02/18 2100 08/03/18 0001  NA 135  --  139  K 3.9  --  3.5  CL 105  --  109  CO2 20*  --  24  GLUCOSE 98  --  105*  BUN 14  --  10  CREATININE 0.72  --  0.67  CALCIUM 8.4*  --  7.9*  MG  --  1.6*  --   PHOS  --  3.5  --    GFR: Estimated Creatinine Clearance: 82.4 mL/min (by C-G formula based on SCr of 0.67 mg/dL). Liver Function Tests: Recent Labs  Lab 08/02/18 1740 08/03/18 0001  AST 38 30  ALT 25 21  ALKPHOS 152* 138*  BILITOT 1.0 0.9  PROT 6.0* 5.4*  ALBUMIN 3.0* 2.6*   No results for input(s): LIPASE, AMYLASE in the last 168 hours. No results for input(s): AMMONIA in the last 168 hours. Coagulation Profile: No results for input(s): INR, PROTIME in the last 168 hours. Cardiac Enzymes: No results for input(s): CKTOTAL, CKMB, CKMBINDEX, TROPONINI in the last 168 hours. BNP (last 3 results) No results for input(s): PROBNP in the last 8760 hours. HbA1C: No results for input(s): HGBA1C in the last 72 hours. CBG: No results for input(s): GLUCAP in the last 168 hours. Lipid Profile: No results for  input(s): CHOL, HDL, LDLCALC, TRIG, CHOLHDL, LDLDIRECT in the last 72 hours. Thyroid Function Tests: No results for input(s): TSH, T4TOTAL, FREET4, T3FREE, THYROIDAB in the last 72 hours. Anemia Panel: No results for input(s): VITAMINB12, FOLATE, FERRITIN, TIBC, IRON, RETICCTPCT in the last 72 hours. Sepsis Labs: Recent Labs  Lab 08/02/18 1805 08/02/18 2233 08/03/18 0001  LATICACIDVEN 1.2 9.8* 0.8    Recent Results (from the past 240 hour(s))  Blood Culture (routine x 2)     Status: None (Preliminary result)   Collection Time: 08/02/18  5:41 PM  Result Value Ref Range Status   Specimen Description BLOOD RIGHT WRIST  Final   Special Requests   Final    BOTTLES DRAWN AEROBIC AND ANAEROBIC Blood Culture results may not be optimal due to an inadequate volume of blood received in culture bottles   Culture   Final    NO GROWTH < 12 HOURS Performed at [facility], [place] [address] [place], [place] [address]    Report Status PENDING  Incomplete  Blood Culture (routine x 2)     Status: None (Preliminary  result)   Collection Time: 08/02/18  5:57 PM  Result Value Ref Range Status   Specimen Description BLOOD RIGHT THUMB  Final   Special Requests   Final    BOTTLES DRAWN AEROBIC ONLY Blood Culture results may not be optimal due to an inadequate volume of blood received in culture bottles   Culture   Final    NO GROWTH < 12 HOURS Performed at [place] [address] [place], [place] [address]    Report Status PENDING  Incomplete  Respiratory Panel by PCR     Status: Abnormal   Collection Time: 08/02/18  8:44 PM  Result Value Ref Range Status   Adenovirus NOT DETECTED NOT DETECTED Final   Coronavirus 229E NOT DETECTED NOT DETECTED Final    Comment: (NOTE) The Coronavirus on the Respiratory Panel, DOES NOT test for the novel  Coronavirus (2019 nCoV)    Coronavirus HKU1 NOT DETECTED NOT DETECTED Final   Coronavirus NL63 NOT DETECTED NOT DETECTED Final   Coronavirus  OC43 NOT DETECTED NOT DETECTED Final   Metapneumovirus DETECTED (A) NOT DETECTED Final   Rhinovirus / Enterovirus NOT DETECTED NOT DETECTED Final   Influenza A NOT DETECTED NOT DETECTED Final   Influenza B NOT DETECTED NOT DETECTED Final   Parainfluenza Virus 1 NOT DETECTED NOT DETECTED Final   Parainfluenza Virus 2 NOT DETECTED NOT DETECTED Final   Parainfluenza Virus 3 NOT DETECTED NOT DETECTED Final   Parainfluenza Virus 4 NOT DETECTED NOT DETECTED Final   Respiratory Syncytial Virus NOT DETECTED NOT DETECTED Final   Bordetella pertussis NOT DETECTED NOT DETECTED Final   Chlamydophila pneumoniae NOT DETECTED NOT DETECTED Final   Mycoplasma pneumoniae NOT DETECTED NOT DETECTED Final    Comment: Performed at [facility], [address] [place], [place] [address]  MRSA PCR Screening     Status: None   Collection Time: 08/02/18  9:51 PM  Result Value Ref Range Status   MRSA by PCR NEGATIVE NEGATIVE Final    Comment:        The GeneXpert MRSA Assay (FDA approved for NASAL specimens only), is one component of a comprehensive MRSA colonization surveillance program. It is not intended to diagnose MRSA infection nor to guide or monitor treatment for MRSA infections. Performed at [facility], [place] [address] [place], [place] [address]          Radiology Studies: Dg Chest 2 View  Result Date: 08/02/2018 CLINICAL DATA:  Shortness of breath.  Productive cough and fever. EXAM: CHEST - 2 VIEW COMPARISON:  Chest x-ray Oct 16, 2017 and CT scan May 07, 2017 FINDINGS: Superior retraction of the prominent left hilum is stable. The heart, hila, mediastinum, and pleura are unchanged. The right Port-A-Cath is stable terminating in the central SVC. No pulmonary nodules or masses. No focal infiltrates. No overt edema. IMPRESSION: No interval change. Electronically Signed   By: Dorise Bullion III M.D   On: 08/02/2018 18:30        Scheduled Meds: . apixaban  5 mg Oral BID    . gabapentin  300 mg Oral TID  . mirabegron ER  25 mg Oral Daily  . pantoprazole  40 mg Oral Daily  . sildenafil  40 mg Oral TID   Continuous Infusions: . 0.9 % NaCl with KCl 20 mEq / L 125 mL/hr at 08/02/18 2338  . azithromycin Stopped (08/02/18 1902)  . ceFEPime (MAXIPIME) IV 2 g (08/03/18 0948)  . vancomycin  750 mg (08/03/18 1056)     LOS: 1 day    Time spent: 76 minutes   Deshanna Kama, MD [facility] Pager [phone]  If 7PM-7AM, please contact night-coverage [email or web address] Password [email or web address] 08/03/2018, 1:14 PM

## 2018-08-03 NOTE — Progress Notes (Addendum)
Initial Nutrition Assessment  DOCUMENTATION CODES:   Not applicable  INTERVENTION:    Liberalize diet to Regular  NUTRITION DIAGNOSIS:   Increased nutrient needs related to cancer and cancer related treatments as evidenced by estimated needs  GOAL:   Patient will meet greater than or equal to 90% of their needs   MONITOR:   PO intake, Labs, Skin, Weight trends, I & O's  REASON FOR ASSESSMENT:   Consult Assessment of nutrition requirement/status  ASSESSMENT:   63 y.o. female with metastatic breast cancer on chemotherapy; admitted with sepsis and HCAP.   RD briefly spoke with pt; she is minimally interactive. Pt reports a poor appetite; ate one egg this AM at breakfast. She shares she hasn't had any unintentional weight loss.  Pt declined nutrition supplements during acute hospitalization. Medications include lomotil, protonix and IV Mg. Labs reviewed. Ca 7.9 (L).  Verbal with Read Back order received per Dr. Lunette Stands to change diet to Regular.  NUTRITION - FOCUSED PHYSICAL EXAM:    Most Recent Value  Orbital Region  No depletion  Upper Arm Region  No depletion  Thoracic and Lumbar Region  No depletion  Buccal Region  No depletion  Temple Region  No depletion  Clavicle Bone Region  No depletion  Clavicle and Acromion Bone Region  No depletion  Scapular Bone Region  No depletion  Dorsal Hand  No depletion  Patellar Region  No depletion  Anterior Thigh Region  No depletion  Posterior Calf Region  No depletion  Edema (RD Assessment)  None     Diet Order:   Diet Order            Diet Heart Room service appropriate? Yes; Fluid consistency: Thin  Diet effective now             EDUCATION NEEDS:   No education needs have been identified at this time  Skin:  Skin Assessment: Reviewed RN Assessment  Last BM:  N/A  Height:   Ht Readings from Last 1 Encounters:  08/02/18 5\' 7"  (1.702 m)   Weight:   Wt Readings from Last 1 Encounters:  08/02/18  86.7 kg   BMI:  Body mass index is 29.93 kg/m.  Estimated Nutritional Needs:   Kcal:  2000-2200  Protein:  100-115 gm  Fluid:  2.0-2.3 L  Arthur Holms, RD, LDN Pager #: [phone] After-Hours Pager #: [phone]

## 2018-08-03 NOTE — Progress Notes (Signed)
CRITICAL VALUE ALERT  Critical Value:  Lactic acid 9.8  Date & Time Notied:  08/02/2018, 2320  Provider Notified: Yes  Orders Received/Actions taken: Yes

## 2018-08-04 DIAGNOSIS — E441 Mild protein-calorie malnutrition: Secondary | ICD-10-CM

## 2018-08-04 DIAGNOSIS — J441 Chronic obstructive pulmonary disease with (acute) exacerbation: Secondary | ICD-10-CM

## 2018-08-04 DIAGNOSIS — J189 Pneumonia, unspecified organism: Secondary | ICD-10-CM

## 2018-08-04 LAB — CBC WITH DIFFERENTIAL/PLATELET
ABS IMMATURE GRANULOCYTES: 0.02 10*3/uL (ref 0.00–0.07)
Basophils Absolute: 0 10*3/uL (ref 0.0–0.1)
Basophils Relative: 0 %
Eosinophils Absolute: 0.1 10*3/uL (ref 0.0–0.5)
Eosinophils Relative: 3 %
HCT: 30.5 % — ABNORMAL LOW (ref 36.0–46.0)
Hemoglobin: 9.4 g/dL — ABNORMAL LOW (ref 12.0–15.0)
Immature Granulocytes: 1 %
Lymphocytes Relative: 15 %
Lymphs Abs: 0.5 10*3/uL — ABNORMAL LOW (ref 0.7–4.0)
MCH: 29.7 pg (ref 26.0–34.0)
MCHC: 30.8 g/dL (ref 30.0–36.0)
MCV: 96.5 fL (ref 80.0–100.0)
Monocytes Absolute: 0.3 10*3/uL (ref 0.1–1.0)
Monocytes Relative: 10 %
Neutro Abs: 2.5 10*3/uL (ref 1.7–7.7)
Neutrophils Relative %: 71 %
Platelets: 112 10*3/uL — ABNORMAL LOW (ref 150–400)
RBC: 3.16 MIL/uL — ABNORMAL LOW (ref 3.87–5.11)
RDW: 18.1 % — ABNORMAL HIGH (ref 11.5–15.5)
WBC: 3.4 10*3/uL — ABNORMAL LOW (ref 4.0–10.5)
nRBC: 0 % (ref 0.0–0.2)

## 2018-08-04 MED ORDER — GABAPENTIN 300 MG PO CAPS
300.0000 mg | ORAL_CAPSULE | Freq: Every day | ORAL | Status: DC
  Administered 2018-08-05: 300 mg via ORAL
  Filled 2018-08-04: qty 1

## 2018-08-04 MED ORDER — METHYLPREDNISOLONE SODIUM SUCC 40 MG IJ SOLR
40.0000 mg | Freq: Three times a day (TID) | INTRAMUSCULAR | Status: DC
  Administered 2018-08-04 – 2018-08-06 (×7): 40 mg via INTRAVENOUS
  Filled 2018-08-04 (×7): qty 1

## 2018-08-04 MED ORDER — LEVALBUTEROL HCL 1.25 MG/0.5ML IN NEBU
1.2500 mg | INHALATION_SOLUTION | Freq: Four times a day (QID) | RESPIRATORY_TRACT | Status: DC
  Administered 2018-08-04 (×2): 1.25 mg via RESPIRATORY_TRACT
  Filled 2018-08-04 (×3): qty 0.5

## 2018-08-04 NOTE — Progress Notes (Signed)
PROGRESS NOTE    [name]  OEV:[id number] DOB: 1955/07/23 DOA: 08/02/2018 PCP: Midge Minium, MD   Brief Narrative: [name] is a 63 y.o. 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  There is no travel history or known sick contacts.  She says she has some mild right earache.  She denies rhinorrhea, sore throat or hemoptysis.  She gets postinfusion erythema urticaria extending from her side of her Port-A-Cath.  ED Course: Initial temperature was 100.6 F, pulse 110, respirations 18, blood pressure 139/77 mmHg and O2 sat 92% on room air.  She received supplemental oxygen, cefepime, azithromycin and vancomycin . Assessment & Plan:   Principal Problem:   Sepsis due to undetermined organism [facility]) Active Problems:   Metastatic breast cancer ([place])   Anemia due to chemotherapy   Hypomagnesemia   Mild protein malnutrition (HCC)   Elevated alkaline phosphatase level   Thrombocytopenia (HCC)   Pulmonary hypertension ([place])   ##Sepsis due to pneumonia present on admission -Admitted with a fever of 101, lactic acid of 9.5, tachycardia -Suspect developing HCAP considering patient's cough, shortness of breath, hypoxia. -Follow-up with blood, sputum cultures-negative to date -Continue with vancomycin, cefepime, azithromycin -De-escalate the  antibiotics once culture data is available  ##Pneumonia healthcare associated -Continue the current antibiotic regimen -Continues to have improvement  ##COPD exacerbation -Continue with the duo nebs, Solu-Medrol -Patient states has improvement with cough and shortness of breath from admission  ##  Metastatic breast cancer ([place]) -Received chemotherapy about 10 days ago. -She was recently at [facility] on 07/29/2018 for clinical trial enrollment. -Protective precautions given immunosuppressed state.   ## Pulmonary hypertension ([place]) -Continue Garba to 40 mg p.o. 3 times daily.  ##  Anemia due to chemotherapy -Monitor hematocrit and hemoglobin. -Hemoglobin stable  ##  Hypomagnesemia -Replace by IV as oral magnesium is causing diarrhea  ##  Mild protein malnutrition ([place]) -Secondary to malignancy and chemotherapy. -Consult nutritional services.    ##Elevated alkaline phosphatase level Likely due to chest wall metastasis. Follow-up level as needed.  ##  Thrombocytopenia ([place]) -Likely due to chemotherapy. -Monitor platelet level.    DVT prophylaxis: Lovenox  code Status: Full code Family Communication husband  disposition Plan: Home in 2 to 3 days   Antimicrobials:  Vancomycin 08/01/2008 -1/7 Cefepime 08/01/2008-1/7 Azithromycin 08/01/2008-1/7  Subjective: Patient states improvement from admission.  Still has cough, tightness in the chest.  Objective: Vitals:   08/03/18 1608 08/04/18 0015 08/04/18 0718 08/04/18 1148  BP: 134/72 115/65 132/71   Pulse: (!) 107 99 95 87  Resp:    16  Temp: 99.4 F (37.4 C) 98.8 F (37.1 C) 99.4 F (37.4 C)   TempSrc: Oral Oral Oral   SpO2: 97% 95% 94% 95%  Weight:      Height:        Intake/Output Summary (Last 24 hours) at 08/04/2018 1158 Last data filed at 08/04/2018 [phone]  Gross per 24 hour  Intake 932.25 ml  Output 500 ml  Net 432.25 ml   Filed Weights   08/02/18 2147  Weight: 86.7 kg    Examination:   General exam: Appears calm and comfortable. Respiratory system: Mild coarse breath sounds, occasional wheezing Cardiovascular system: S1 & S2 heard, RRR. No JVD, murmurs, rubs, gallops or clicks. No pedal edema. Gastrointestinal system: Abdomen is nondistended, soft and nontender. No organomegaly or masses felt. Normal bowel sounds heard. Central nervous system: Alert and oriented. No focal neurological deficits. Extremities: Symmetric 5 x 5 power. Skin: Erythema on the chest right more than the left.  Patient states she has baseline erythema no worsening in the last few days  psychiatry: Judgement and insight appear normal. Mood & affect appropriate.     Data Reviewed: I have personally reviewed following labs and imaging studies  CBC: Recent Labs  Lab 08/02/18 1740 08/03/18 0001 08/04/18 0248  WBC 4.4 4.2 3.4*  NEUTROABS 3.4 3.2 2.5  HGB 11.3* 9.7* 9.4*  HCT 36.9 30.1* 30.5*  MCV 99.7 96.5 96.5  PLT 136* 123* 027*   Basic Metabolic Panel: Recent Labs  Lab 08/02/18 1740 08/02/18 2100 08/03/18 0001  NA 135  --  139  K 3.9  --  3.5  CL 105  --  109  CO2 20*  --  24  GLUCOSE 98  --  105*  BUN 14  --  10  CREATININE 0.72  --  0.67  CALCIUM 8.4*  --  7.9*  MG  --  1.6*  --   PHOS  --  3.5  --    GFR: Estimated Creatinine Clearance: 82.4 mL/min (by C-G formula based on SCr of 0.67 mg/dL). Liver Function Tests: Recent Labs  Lab 08/02/18 1740 08/03/18 0001  AST 38 30  ALT 25 21  ALKPHOS 152* 138*  BILITOT 1.0 0.9  PROT 6.0* 5.4*  ALBUMIN 3.0* 2.6*   No results for input(s): LIPASE, AMYLASE in the last 168 hours. No results for input(s): AMMONIA in the last 168 hours. Coagulation Profile: No results for input(s): INR, PROTIME in the last 168 hours. Cardiac Enzymes: No results for input(s): CKTOTAL, CKMB, CKMBINDEX, TROPONINI in the last 168 hours. BNP (last 3 results) No results for input(s): PROBNP in the last 8760 hours. HbA1C: No results for input(s):  HGBA1C in the last 72 hours. CBG: No results for input(s): GLUCAP in the last 168 hours. Lipid Profile: No results for input(s): CHOL, HDL, LDLCALC, TRIG, CHOLHDL, LDLDIRECT in the last 72 hours. Thyroid Function Tests: No results for input(s): TSH, T4TOTAL, FREET4, T3FREE, THYROIDAB in the last 72 hours. Anemia Panel: No results for input(s): VITAMINB12, FOLATE, FERRITIN, TIBC, IRON, RETICCTPCT in the last 72 hours. Sepsis Labs: Recent Labs  Lab 08/02/18 1805 08/02/18 2233 08/03/18 0001  LATICACIDVEN 1.2 9.8* 0.8    Recent Results (from the past 240 hour(s))  Blood Culture (routine x 2)     Status: None (Preliminary result)   Collection Time: 08/02/18  5:41 PM  Result Value Ref Range Status   Specimen Description BLOOD RIGHT WRIST  Final   Special Requests   Final    BOTTLES DRAWN AEROBIC AND ANAEROBIC Blood Culture results may not be optimal due to an inadequate volume of blood received in culture bottles   Culture   Final    NO GROWTH 2 DAYS Performed at [facility], [place] [address], [place], [place] [address]    Report Status PENDING  Incomplete  Blood Culture (routine x 2)     Status: None (Preliminary result)   Collection Time: 08/02/18  5:57 PM  Result Value Ref Range Status   Specimen Description BLOOD RIGHT THUMB  Final   Special Requests   Final    BOTTLES DRAWN AEROBIC ONLY Blood Culture results may not be optimal due to an inadequate volume of blood received in culture bottles   Culture   Final    NO GROWTH 2 DAYS Performed at [facility], [place] [address], [place], [place] [address]    Report Status PENDING  Incomplete  Respiratory Panel by PCR     Status: Abnormal   Collection Time: 08/02/18  8:44 PM  Result Value Ref Range Status   Adenovirus NOT DETECTED NOT DETECTED Final   Coronavirus 229E NOT DETECTED NOT DETECTED Final    Comment: (NOTE) The Coronavirus on the Respiratory Panel, DOES NOT test for the novel  Coronavirus (2019 nCoV)     Coronavirus HKU1 NOT DETECTED NOT DETECTED Final   Coronavirus NL63 NOT DETECTED NOT DETECTED Final   Coronavirus OC43 NOT DETECTED NOT DETECTED Final   Metapneumovirus DETECTED (A) NOT DETECTED Final   Rhinovirus / Enterovirus NOT DETECTED NOT DETECTED Final   Influenza A NOT DETECTED NOT DETECTED Final   Influenza B NOT DETECTED NOT DETECTED Final   Parainfluenza Virus 1 NOT DETECTED NOT DETECTED Final   Parainfluenza Virus 2 NOT DETECTED NOT DETECTED Final   Parainfluenza Virus 3 NOT DETECTED NOT DETECTED Final   Parainfluenza Virus 4 NOT DETECTED NOT DETECTED Final   Respiratory Syncytial Virus NOT DETECTED NOT DETECTED Final   Bordetella pertussis NOT DETECTED NOT DETECTED Final   Chlamydophila pneumoniae NOT DETECTED NOT DETECTED Final   Mycoplasma pneumoniae NOT DETECTED NOT DETECTED Final    Comment: Performed at [facility], [address] [place], [place] [address]  MRSA PCR Screening     Status: None   Collection Time: 08/02/18  9:51 PM  Result Value Ref Range Status   MRSA by PCR NEGATIVE NEGATIVE Final    Comment:        The GeneXpert MRSA Assay (FDA approved for NASAL specimens only), is one component of a comprehensive MRSA colonization surveillance program. It is not intended to diagnose MRSA infection nor to guide or monitor treatment for MRSA infections. Performed at [facility], [place] [address] [place],  [address]          Radiology Studies: Dg Chest 2 View  Result Date: 08/02/2018 CLINICAL DATA:  Shortness of breath.  Productive cough and fever. EXAM: CHEST - 2 VIEW COMPARISON:  Chest x-ray Oct 16, 2017 and CT scan May 07, 2017 FINDINGS: Superior retraction of the prominent left hilum is stable. The heart, hila, mediastinum, and pleura are unchanged. The right Port-A-Cath is stable terminating in the central SVC. No pulmonary nodules or masses. No focal infiltrates. No overt edema. IMPRESSION: No interval change. Electronically  Signed   By: Dorise Bullion III M.D   On: 08/02/2018 18:30        Scheduled Meds: . apixaban  5 mg Oral BID  . [START ON 08/05/2018] gabapentin  300 mg Oral QHS  . levalbuterol  1.25 mg Nebulization Q6H  . methylPREDNISolone (SOLU-MEDROL) injection  40 mg Intravenous Q8H  . mirabegron ER  25 mg Oral Daily  . pantoprazole  40 mg Oral Daily  . sildenafil  40 mg Oral TID   Continuous Infusions: .  azithromycin 500 mg (08/03/18 1807)  . ceFEPime (MAXIPIME) IV 2 g (08/04/18 0137)  . vancomycin 750 mg (08/04/18 0424)     LOS: 2 days    Time spent: 70 minutes   William Schake, MD [facility] Pager [phone]  If 7PM-7AM, please contact night-coverage [email or web address] Password [facility] 08/04/2018, 11:58 AM

## 2018-08-05 LAB — CBC WITH DIFFERENTIAL/PLATELET
Band Neutrophils: 0 %
Basophils Absolute: 0 10*3/uL (ref 0.0–0.1)
Basophils Relative: 0 %
Blasts: 0 %
EOS PCT: 0 %
Eosinophils Absolute: 0 10*3/uL (ref 0.0–0.5)
HCT: 30.7 % — ABNORMAL LOW (ref 36.0–46.0)
Hemoglobin: 9.7 g/dL — ABNORMAL LOW (ref 12.0–15.0)
LYMPHS ABS: 0.3 10*3/uL — AB (ref 0.7–4.0)
Lymphocytes Relative: 13 %
MCH: 30.2 pg (ref 26.0–34.0)
MCHC: 31.6 g/dL (ref 30.0–36.0)
MCV: 95.6 fL (ref 80.0–100.0)
MONO ABS: 0.1 10*3/uL (ref 0.1–1.0)
Metamyelocytes Relative: 0 %
Monocytes Relative: 4 %
Myelocytes: 0 %
Neutro Abs: 1.8 10*3/uL (ref 1.7–7.7)
Neutrophils Relative %: 83 %
Other: 0 %
PLATELETS: 116 10*3/uL — AB (ref 150–400)
Promyelocytes Relative: 0 %
RBC: 3.21 MIL/uL — ABNORMAL LOW (ref 3.87–5.11)
RDW: 17.5 % — ABNORMAL HIGH (ref 11.5–15.5)
WBC: 2.2 10*3/uL — ABNORMAL LOW (ref 4.0–10.5)
nRBC: 0 % (ref 0.0–0.2)
nRBC: 0 /100 WBC

## 2018-08-05 LAB — BASIC METABOLIC PANEL
Anion gap: 6 (ref 5–15)
BUN: 11 mg/dL (ref 8–23)
CO2: 25 mmol/L (ref 22–32)
Calcium: 8.4 mg/dL — ABNORMAL LOW (ref 8.9–10.3)
Chloride: 108 mmol/L (ref 98–111)
Creatinine, Ser: 0.68 mg/dL (ref 0.44–1.00)
Glucose, Bld: 149 mg/dL — ABNORMAL HIGH (ref 70–99)
Potassium: 4.3 mmol/L (ref 3.5–5.1)
SODIUM: 139 mmol/L (ref 135–145)

## 2018-08-05 IMAGING — DX DG SHOULDER 2+V*L*
3 series · 3 of 3 positions shown · non-contrast
Comparison: Limited views of the left shoulder from a chest x-ray
December 12, 2016

CLINICAL DATA: Three month history of left shoulder pain with no
known injury.

EXAM:
LEFT SHOULDER - 2+ VIEW

[shoulder grashey ap]
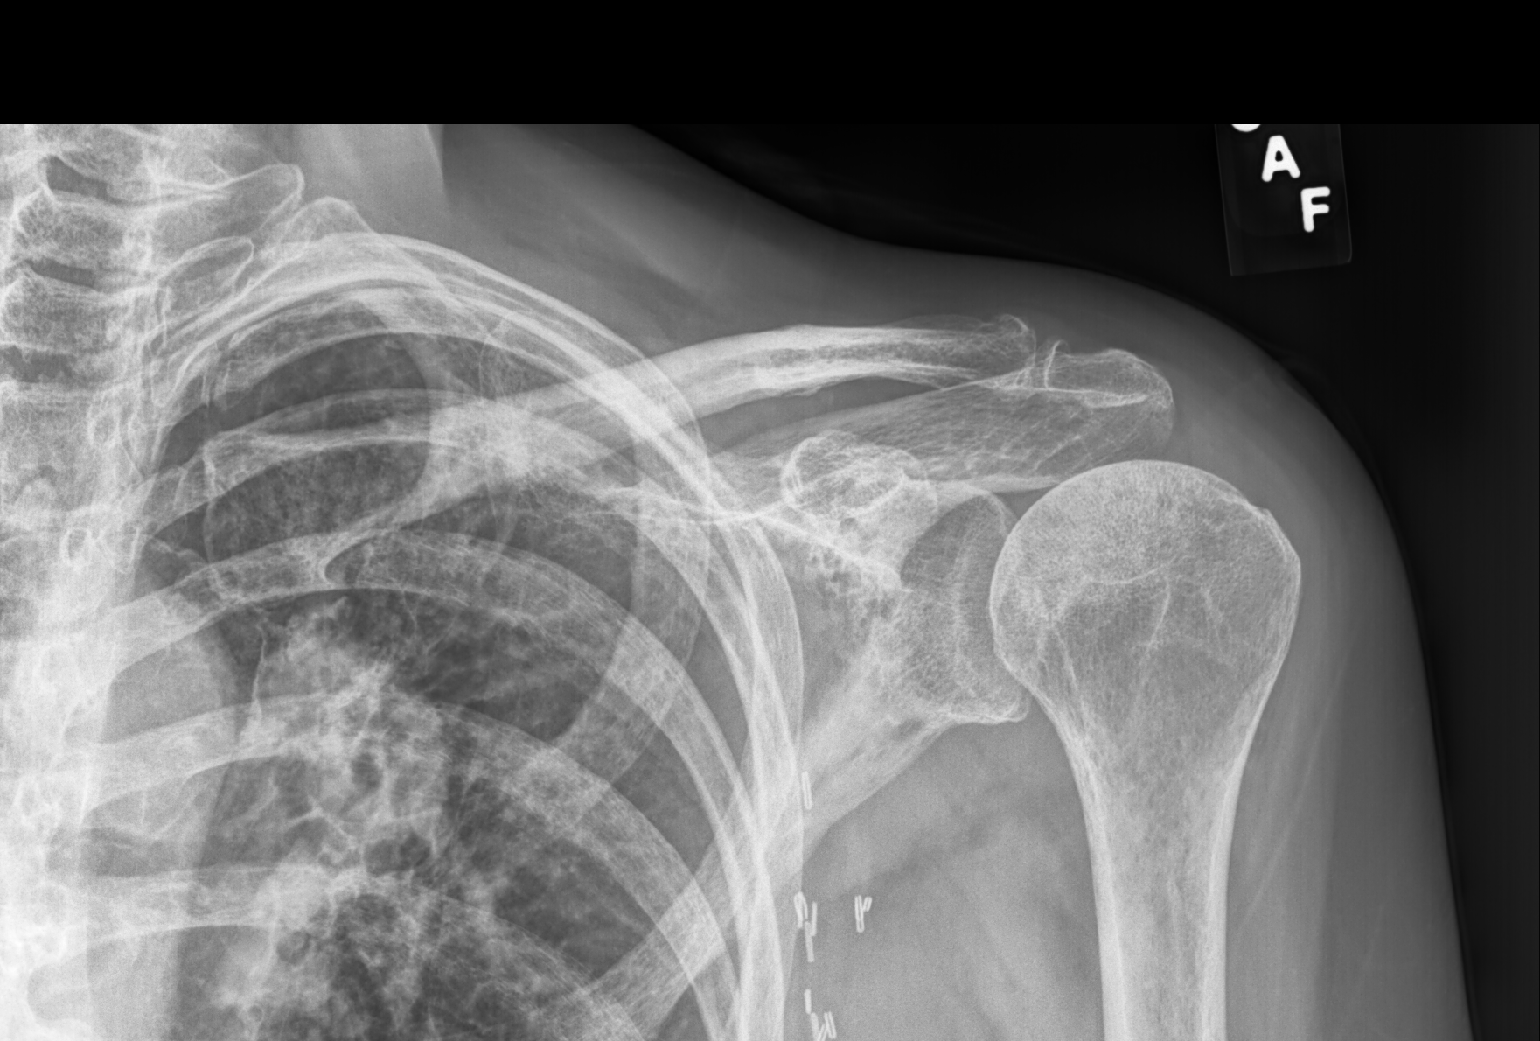

[shoulder y view]
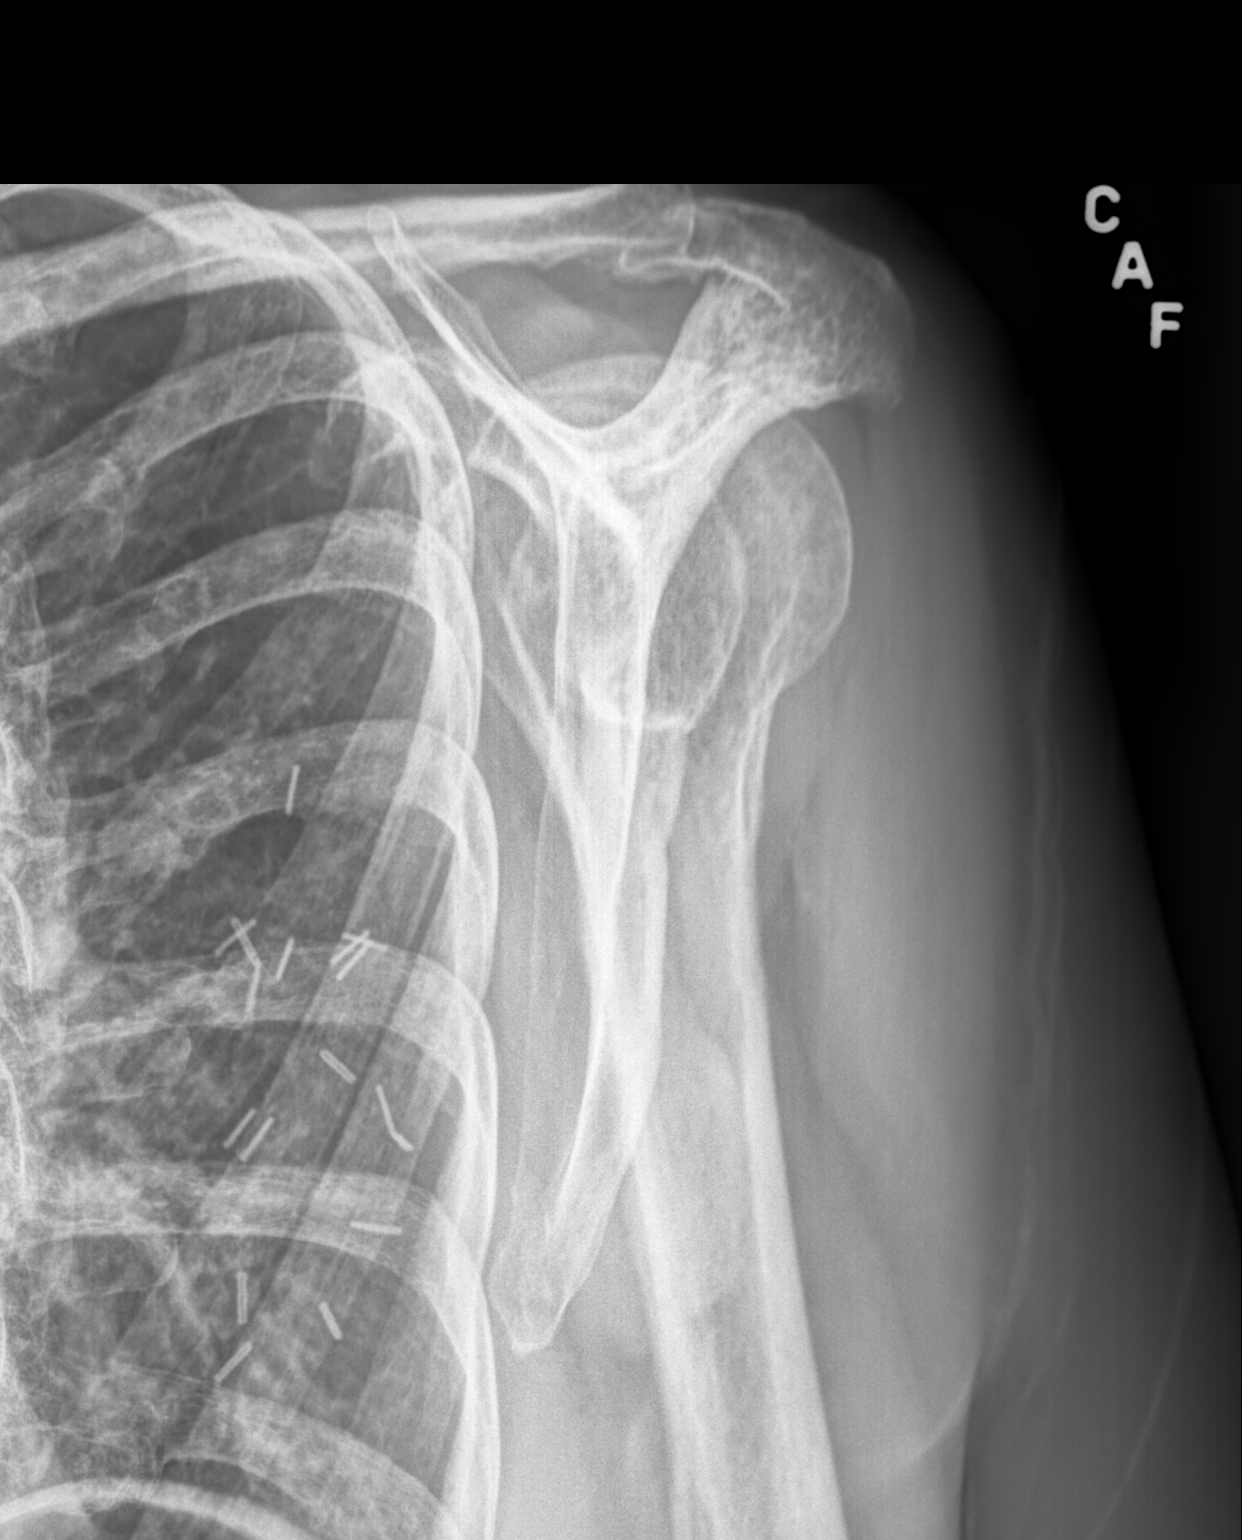

[shoulder axial]
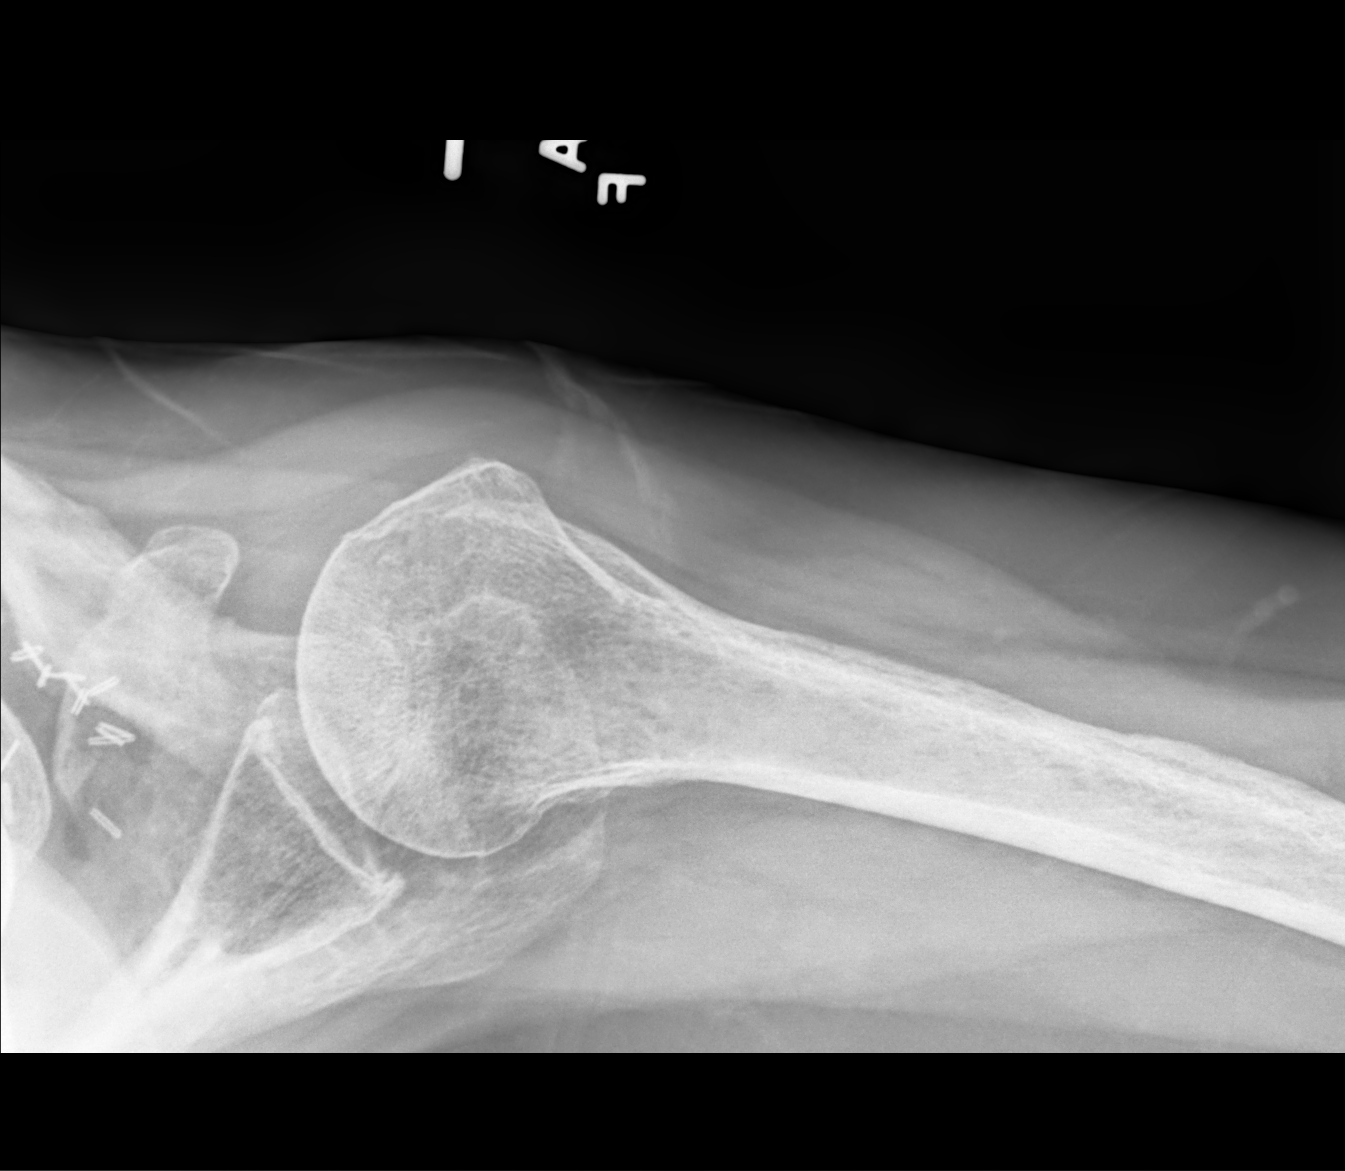

[3 of 3 positions shown; findings below may reference images not displayed]

FINDINGS: The bones are subjectively mildly osteopenic. A permeative pattern
of the visualized osseous structures is present and appears new.
There is no acute or healing fracture. The glenohumeral and AC joint
spaces are well maintained. The subacromial subdeltoid space is
normal.
IMPRESSION: No acute fracture or dislocation of the left shoulder is observed.
There is a diffuse permeative pattern of the osseous structures
which is much more conspicuous than on previous studies which may
reflect a marrow replacing process such as multiple myeloma or
diffuse metastases. Correlation with the patient's clinical
examination and laboratory values is needed.

## 2018-08-05 MED ORDER — LEVALBUTEROL HCL 1.25 MG/0.5ML IN NEBU
1.2500 mg | INHALATION_SOLUTION | Freq: Two times a day (BID) | RESPIRATORY_TRACT | Status: DC
  Administered 2018-08-05 – 2018-08-06 (×4): 1.25 mg via RESPIRATORY_TRACT
  Filled 2018-08-05 (×3): qty 0.5

## 2018-08-05 NOTE — Plan of Care (Signed)
  Problem: Coping: Goal: Level of anxiety will decrease Outcome: Adequate for Discharge   

## 2018-08-05 NOTE — Progress Notes (Signed)
Pharmacy Antibiotic Note  [name] is a 63 y.o. female admitted on 08/02/2018 with pneumonia.  Pharmacy was consulted for vancomycin, cefepime, and azithromycin dosing. Day # 4 abx for PNA/sepsis coverage.  Afebrile . WBC 3.4>2.2. on Chemo PTA, last at [facility] about 10 days PTA .  Blood cultures no growth to date. MRSA PCR negative. Vancomycin discontinued today.   Plan: Vancomycin discontinued today 08/05/18 Azithromycin 500 mg IV q24h continued Cefepime 2g IV q8h continued Monitor Scr, CBC, and clinical progression F/u C&S, de-escalation plans, and LOT  Height: 5\' 7"  (170.2 cm) Weight: 191 lb 1.6 oz (86.7 kg) IBW/kg (Calculated) : 61.6  Temp (24hrs), Avg:98.5 F (36.9 C), Min:98.4 F (36.9 C), Max:98.6 F (37 C)  Recent Labs  Lab 08/02/18 1740 08/02/18 1805 08/02/18 2233 08/03/18 0001 08/04/18 0248 08/05/18 0223  WBC 4.4  --   --  4.2 3.4* 2.2*  CREATININE 0.72  --   --  0.67  --  0.68  LATICACIDVEN  --  1.2 9.8* 0.8  --   --     Estimated Creatinine Clearance: 82.4 mL/min (by C-G formula based on SCr of 0.68 mg/dL).    Allergies  Allergen Reactions  . Penicillins Hives, Itching, Rash and Other (See Comments)    Patient has previously tolerated cefazolin, ceftriaxone, and cefepime  PATIENT HAS HAD A PCN REACTION WITH IMMEDIATE RASH, FACIAL/TONGUE/THROAT SWELLING, SOB, OR LIGHTHEADEDNESS WITH HYPOTENSION:  #  #  YES  #  #  Has patient had a PCN reaction causing severe rash involving mucus membranes or skin necrosis: No Has patient had a PCN reaction that required hospitalization No Has patient had a PCN reaction occurring within the last 10 years: yes Denies airway involvement   . Tape Hives and Other (See Comments)    Can tolerate paper and adhesive,  NO MEDIPORE TAPE  . Other Rash and Other (See Comments)    STERI STRIPS - Blisters    Antimicrobials this admission: Vancomycin 3/9 >> 3/12 Cefepime 3/9 >>  Azithromycin 3/9 >>   Dose adjustments this  admission: N/A  Microbiology results: 3/9 BCx: ngtd 3/9 MRSA PCR: negative  Thank you for allowing pharmacy to be a part of this patient's care.  Leron Croak, PharmD PGY1 Pharmacy Resident  Please check AMION for all [facility] phone numbers 08/05/2018 4:37 PM

## 2018-08-05 NOTE — Progress Notes (Signed)
PROGRESS NOTE    [name]  YNW:[id number] DOB: 1956/05/11 DOA: 08/02/2018 PCP: Midge Minium, MD   Brief Narrative: [name] is a 63 y.o. 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  There is no travel history or known sick contacts.  She says she has some mild right earache.  She denies rhinorrhea, sore throat or hemoptysis.  She gets postinfusion erythema urticaria extending from her side of her Port-A-Cath.  ED Course: Initial temperature was 100.6 F, pulse 110, respirations 18, blood pressure 139/77 mmHg and O2 sat 92% on room air.  She received supplemental oxygen, cefepime, azithromycin and vancomycin . Assessment & Plan:   Principal Problem:   Sepsis due to undetermined organism [facility]) Active Problems:   Metastatic breast cancer ([place])   Anemia due to chemotherapy   Hypomagnesemia   Mild protein malnutrition (HCC)   Elevated alkaline phosphatase level   Thrombocytopenia (HCC)   Pulmonary hypertension ([place])   ##Sepsis due to pneumonia present on admission -Admitted with a fever of 101, lactic acid of 9.5, tachycardia -Suspect developing HCAP considering patient's cough, shortness of breath, hypoxia. -Follow-up with blood, sputum cultures-negative to date -Discontinued  vancomycin, continue with cefepime, azithromycin    ##Pneumonia healthcare associated -Continue the current antibiotic regimen -Continues to have improvement  ##COPD exacerbation -Continue with the duo nebs, Solu-Medrol -Patient states has improvement with cough and shortness of breath from admission -Improved from yesterday  ##  Metastatic breast cancer ([place]) -Received chemotherapy about 10 days ago. -She was recently at [facility] on 07/29/2018 for clinical trial enrollment. -Protective precautions given immunosuppressed state.   ## Pulmonary hypertension ([place]) -Continue Garba to 40 mg p.o. 3 times daily.  ##  Anemia due to chemotherapy -Monitor hematocrit and hemoglobin. -Hemoglobin stable  ##  Hypomagnesemia -Replace by IV as oral magnesium is causing diarrhea  ##  Mild protein malnutrition ([place]) -Secondary to malignancy and chemotherapy. -Consult nutritional services.    ##Elevated alkaline phosphatase level Likely due to chest wall metastasis. Follow-up level as needed.  ##  Thrombocytopenia ([place]) -Likely due to chemotherapy. -Monitor platelet level.    DVT prophylaxis: Lovenox  code Status: Full code Family Communication husband  disposition Plan: Home in 2 to 3 days   Antimicrobials:  Vancomycin 08/01/2008 -1/7 Cefepime 08/01/2008-1/7 Azithromycin 08/01/2008-1/7  Subjective: Improvement with wheezing from yesterday.  Still feeling short of breath with walking  Objective: Vitals:   08/04/18 1945 08/05/18 0330 08/05/18 0748 08/05/18 0805  BP:    (!) 119/56  Pulse:   94 99  Resp:   18   Temp:    98.4 F (36.9 C)  TempSrc:    Oral  SpO2: 95% 94% 93% 93%  Weight:      Height:        Intake/Output Summary (Last 24 hours) at 08/05/2018 1352 Last data filed at 08/05/2018 0500 Gross per 24 hour  Intake 1284.57 ml  Output -  Net 1284.57 ml   Filed Weights   08/02/18 2147  Weight: 86.7 kg    Examination:  General exam: Appears calm and comfortable. Respiratory system: Still has  significant coarse breath sounds bilaterally improved wheezing from yesterday Cardiovascular system: S1 & S2 heard, RRR. No JVD, murmurs, rubs, gallops or clicks. No pedal edema. Gastrointestinal system: Abdomen is nondistended, soft and nontender. No organomegaly or masses felt. Normal bowel sounds heard. Central nervous system: Alert and oriented. No focal neurological deficits. Extremities: Symmetric 5 x 5 power. Skin: Erythema on the chest right more than the left.  Patient states she has baseline erythema no worsening in the last few days  psychiatry: Judgement and insight appear normal. Mood & affect appropriate.     Data Reviewed: I have personally reviewed following labs and imaging studies  CBC: Recent Labs  Lab 08/02/18 1740 08/03/18 0001 08/04/18 0248 08/05/18 0223  WBC 4.4 4.2 3.4* 2.2*  NEUTROABS 3.4 3.2 2.5 1.8  HGB 11.3* 9.7* 9.4* 9.7*  HCT 36.9 30.1* 30.5* 30.7*  MCV 99.7 96.5 96.5 95.6  PLT 136* 123* 112* 921*   Basic Metabolic Panel: Recent Labs  Lab 08/02/18 1740 08/02/18 2100 08/03/18 0001 08/05/18 0223  NA 135  --  139 139  K 3.9  --  3.5 4.3  CL 105  --  109 108  CO2 20*  --  24 25  GLUCOSE 98  --  105* 149*  BUN 14  --  10 11  CREATININE 0.72  --  0.67 0.68  CALCIUM 8.4*  --  7.9* 8.4*  MG  --  1.6*  --   --   PHOS  --  3.5  --   --    GFR: Estimated Creatinine Clearance: 82.4 mL/min (by C-G formula based on SCr of 0.68 mg/dL). Liver Function Tests: Recent Labs  Lab 08/02/18 1740 08/03/18 0001  AST 38 30  ALT 25 21  ALKPHOS 152* 138*  BILITOT 1.0 0.9  PROT 6.0* 5.4*  ALBUMIN 3.0* 2.6*   No results for input(s): LIPASE, AMYLASE in the last 168 hours. No results for input(s): AMMONIA in the last 168 hours. Coagulation Profile: No results for input(s): INR, PROTIME in the last 168 hours. Cardiac Enzymes: No results for input(s): CKTOTAL, CKMB, CKMBINDEX, TROPONINI in the last 168 hours. BNP (last 3 results) No results for input(s):  PROBNP in the last 8760 hours. HbA1C: No results for input(s): HGBA1C in the last 72 hours. CBG: No results for input(s): GLUCAP in the last 168 hours. Lipid Profile: No results for input(s): CHOL, HDL, LDLCALC, TRIG, CHOLHDL, LDLDIRECT in the last 72 hours. Thyroid Function Tests: No results for input(s): TSH, T4TOTAL, FREET4, T3FREE, THYROIDAB in the last 72 hours. Anemia Panel: No results for input(s): VITAMINB12, FOLATE, FERRITIN, TIBC, IRON, RETICCTPCT in the last 72 hours. Sepsis Labs: Recent Labs  Lab 08/02/18 1805 08/02/18 2233 08/03/18 0001  LATICACIDVEN 1.2 9.8* 0.8    Recent Results (from the past 240 hour(s))  Blood Culture (routine x 2)     Status: None (Preliminary result)   Collection Time: 08/02/18  5:41 PM  Result Value Ref Range Status   Specimen Description BLOOD RIGHT WRIST  Final   Special Requests   Final    BOTTLES DRAWN AEROBIC AND ANAEROBIC Blood Culture results may not be optimal due to an inadequate volume of blood received in culture bottles   Culture   Final    NO GROWTH 3 DAYS Performed  at [facility], [place] [address] [place], [place] [address]    Report Status PENDING  Incomplete  Blood Culture (routine x 2)     Status: None (Preliminary result)   Collection Time: 08/02/18  5:57 PM  Result Value Ref Range Status   Specimen Description BLOOD RIGHT THUMB  Final   Special Requests   Final    BOTTLES DRAWN AEROBIC ONLY Blood Culture results may not be optimal due to an inadequate volume of blood received in culture bottles   Culture   Final    NO GROWTH 3 DAYS Performed at [facility], [place] [address], [place], [place] [address]    Report Status PENDING  Incomplete  Respiratory Panel by PCR     Status: Abnormal   Collection Time: 08/02/18  8:44 PM  Result Value Ref Range Status   Adenovirus NOT DETECTED NOT DETECTED Final   Coronavirus 229E NOT DETECTED NOT DETECTED Final    Comment: (NOTE) The Coronavirus on the Respiratory  Panel, DOES NOT test for the novel  Coronavirus (2019 nCoV)    Coronavirus HKU1 NOT DETECTED NOT DETECTED Final   Coronavirus NL63 NOT DETECTED NOT DETECTED Final   Coronavirus OC43 NOT DETECTED NOT DETECTED Final   Metapneumovirus DETECTED (A) NOT DETECTED Final   Rhinovirus / Enterovirus NOT DETECTED NOT DETECTED Final   Influenza A NOT DETECTED NOT DETECTED Final   Influenza B NOT DETECTED NOT DETECTED Final   Parainfluenza Virus 1 NOT DETECTED NOT DETECTED Final   Parainfluenza Virus 2 NOT DETECTED NOT DETECTED Final   Parainfluenza Virus 3 NOT DETECTED NOT DETECTED Final   Parainfluenza Virus 4 NOT DETECTED NOT DETECTED Final   Respiratory Syncytial Virus NOT DETECTED NOT DETECTED Final   Bordetella pertussis NOT DETECTED NOT DETECTED Final   Chlamydophila pneumoniae NOT DETECTED NOT DETECTED Final   Mycoplasma pneumoniae NOT DETECTED NOT DETECTED Final    Comment: Performed at [facility], [address] [place], [place] [address]  MRSA PCR Screening     Status: None   Collection Time: 08/02/18  9:51 PM  Result Value Ref Range Status   MRSA by PCR NEGATIVE NEGATIVE Final    Comment:        The GeneXpert MRSA Assay (FDA approved for NASAL specimens only), is one component of a comprehensive MRSA colonization surveillance program. It is not intended to diagnose MRSA infection nor to guide or monitor treatment for MRSA infections. Performed at [facility], [place] [address] [place], [place] [address]          Radiology Studies: No results found.      Scheduled Meds: . apixaban  5 mg Oral BID  . gabapentin  300 mg Oral QHS  . levalbuterol  1.25 mg Nebulization BID  . methylPREDNISolone (SOLU-MEDROL) injection  40 mg Intravenous Q8H  . mirabegron ER  25 mg Oral Daily  . pantoprazole  40 mg Oral Daily  . sildenafil  40 mg Oral TID   Continuous Infusions: . azithromycin 500 mg (08/04/18 1817)  . ceFEPime (MAXIPIME) IV 2 g (08/05/18 1020)      LOS: 3 days    Time spent: 18 minutes   Ferdinand Revoir, MD [facility] Pager [phone]  If 7PM-7AM, please contact night-coverage [email or web address] Password [facility] 08/05/2018, 1:52 PM

## 2018-08-06 ENCOUNTER — Encounter: Payer: Self-pay | Admitting: Family Medicine

## 2018-08-06 DIAGNOSIS — J159 Unspecified bacterial pneumonia: Secondary | ICD-10-CM

## 2018-08-06 LAB — CBC WITH DIFFERENTIAL/PLATELET
Abs Immature Granulocytes: 0.03 10*3/uL (ref 0.00–0.07)
Basophils Absolute: 0 10*3/uL (ref 0.0–0.1)
Basophils Relative: 0 %
Eosinophils Absolute: 0 10*3/uL (ref 0.0–0.5)
Eosinophils Relative: 0 %
HCT: 29.3 % — ABNORMAL LOW (ref 36.0–46.0)
HEMOGLOBIN: 9.7 g/dL — AB (ref 12.0–15.0)
Immature Granulocytes: 1 %
Lymphocytes Relative: 8 %
Lymphs Abs: 0.4 10*3/uL — ABNORMAL LOW (ref 0.7–4.0)
MCH: 31.5 pg (ref 26.0–34.0)
MCHC: 33.1 g/dL (ref 30.0–36.0)
MCV: 95.1 fL (ref 80.0–100.0)
MONOS PCT: 4 %
Monocytes Absolute: 0.2 10*3/uL (ref 0.1–1.0)
Neutro Abs: 4.3 10*3/uL (ref 1.7–7.7)
Neutrophils Relative %: 87 %
Platelets: 171 10*3/uL (ref 150–400)
RBC: 3.08 MIL/uL — ABNORMAL LOW (ref 3.87–5.11)
RDW: 17.5 % — ABNORMAL HIGH (ref 11.5–15.5)
WBC: 4.9 10*3/uL (ref 4.0–10.5)
nRBC: 0 % (ref 0.0–0.2)

## 2018-08-06 MED ORDER — GABAPENTIN 300 MG PO CAPS: 300.0000 mg | ORAL_CAPSULE | Freq: Every day | ORAL | Status: DC

## 2018-08-06 MED ORDER — LEVOFLOXACIN 750 MG PO TABS: 750.0000 mg | ORAL_TABLET | Freq: Every day | ORAL | 0 refills | Status: AC

## 2018-08-06 MED ORDER — PREDNISONE 20 MG PO TABS: 20.0000 mg | ORAL_TABLET | Freq: Two times a day (BID) | ORAL | 0 refills | Status: DC

## 2018-08-06 MED FILL — levoFLOXacin 750 MG TABS: 750 | 4 days supply | Qty: 4 | Fill #0

## 2018-08-06 MED FILL — predniSONE 20 MG TABS: 20 | 5 days supply | Qty: 10 | Fill #0

## 2018-08-06 NOTE — Evaluation (Signed)
SATURATION QUALIFICATIONS: (This note is used to comply with regulatory documentation for home oxygen)  Patient Saturations on Room Air at Rest = 94%  Patient Saturations on Room Air while Ambulating = 82%  Patient Saturations on 4 Liters of oxygen while Ambulating = 92%  Please briefly explain why patient needs home oxygen: Patient desaturates oxygen level to 82% ambulating 40 feet on room air, patient able to recover over two minutes with oxygen at four liters

## 2018-08-06 NOTE — TOC Transition Note (Signed)
Transition of Care [facility]) - CM/SW Discharge Note   Patient Details  Name: [name] MRN: [id number] Date of Birth: [birth year]  Transition of Care [facility]) CM/SW Contact:  Pollie Friar, RN Phone Number: 08/06/2018, 3:11 PM   Clinical Narrative:    Spouse to provide transport home. Pt concerned about oxygen lasting while she is at her cancer treatments. CM updated Jermaine with [employer] that is providing her oxygen and they will have her a portable concentrator. Pt aware.   Final next level of care: Home/Self Care Barriers to Discharge: No Barriers Identified   Patient Goals and CMS Choice   CMS Medicare.gov Compare Post Acute Care list provided to:: Patient Choice offered to / list presented to : Patient  Discharge Placement                       Discharge Plan and Services Discharge Planning Services: CM Consult Post Acute Care Choice: Durable Medical Equipment          DME Arranged: Oxygen DME Agency: [employer](spoke to Jermaine and oxygen will be delivered to the room)       Social Determinants of Health (SDOH) Interventions     Readmission Risk Interventions No flowsheet data found.

## 2018-08-06 NOTE — TOC Initial Note (Signed)
Transition of Care [facility]) - Initial/Assessment Note    Patient Details  Name: [name] MRN: [id number] Date of Birth: Apr 26, 1956  Transition of Care [facility]) CM/SW Contact:    Pollie Friar, RN Phone Number: 08/06/2018, 3:11 PM  Clinical Narrative:                   Expected Discharge Plan: Home/Self Care Barriers to Discharge: No Barriers Identified   Patient Goals and CMS Choice   CMS Medicare.gov Compare Post Acute Care list provided to:: Patient Choice offered to / list presented to : Patient  Expected Discharge Plan and Services Expected Discharge Plan: Home/Self Care Discharge Planning Services: CM Consult Post Acute Care Choice: Durable Medical Equipment Living arrangements for the past 2 months: Single Family Home Expected Discharge Date: 08/06/18               DME Arranged: Oxygen DME Agency: [employer](spoke to Jermaine and oxygen will be delivered to the room)      Prior Living Arrangements/Services Living arrangements for the past 2 months: Single Family Home Lives with:: Spouse   Do you feel safe going back to the place where you live?: Yes               Activities of Daily Living Home Assistive Devices/Equipment: Eyeglasses ADL Screening (condition at time of admission) Patient's cognitive ability adequate to safely complete daily activities?: Yes Is the patient deaf or have difficulty hearing?: No Does the patient have difficulty seeing, even when wearing glasses/contacts?: No Does the patient have difficulty concentrating, remembering, or making decisions?: No Patient able to express need for assistance with ADLs?: Yes Does the patient have difficulty dressing or bathing?: No Independently performs ADLs?: Yes (appropriate for developmental age) Does the patient have difficulty walking or climbing stairs?: No Weakness of Legs: None Weakness of Arms/Hands: None  Permission Sought/Granted                  Emotional  Assessment Appearance:: Appears older than stated age Attitude/Demeanor/Rapport: Engaged Affect (typically observed): Accepting, Appropriate Orientation: : Oriented to Self, Oriented to Place, Oriented to  Time Alcohol / Substance Use: Not Applicable Psych Involvement: No (comment)  Admission diagnosis:  Pneumonia due to infectious organism, unspecified laterality, unspecified part of lung [J18.9] Patient Active Problem List   Diagnosis Date Noted  . Sepsis due to undetermined organism ([place]) 08/02/2018  . Hypomagnesemia 08/02/2018  . Mild protein malnutrition ([place]) 08/02/2018  . Elevated alkaline phosphatase level 08/02/2018  . Thrombocytopenia ([place]) 08/02/2018  . Pulmonary hypertension ([place]) 08/02/2018  . Vocal cord paralysis 03/31/2018  . Chronic left shoulder pain 01/15/2018  . Wound, open with complication 57/84/6962  . Anemia due to chemotherapy 05/07/2017  . Abnormal LFTs 05/07/2017  . History of breast cancer   . Bilateral leg edema   . S/P revision of total knee, right 04/27/2017  . Physical exam 07/31/2016  . S/P total knee arthroplasty, right 05/31/2016  . 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 Wound dehiscence, surgical 04/12/2016  . 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 Vitamin D deficiency 01/25/2016  . Metastatic cancer to chest wall ([place]) 02/02/2015  . Primary osteoarthritis of right knee 02/01/2015  . Symptomatic anemia 02/01/2015  . Frequent UTI 01/12/2014  . Sinus tachycardia 11/03/2013  . SOB (shortness of breath) 11/03/2013  . Neuropathy due to chemotherapeutic drug ([place]) 07/25/2011  . 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 Metastatic breast cancer ([place]) 11/12/2010  . Chronic anticoagulation 11/12/2010  .  Anxiety, generalized 03/26/2008   PCP:  Midge Minium, MD Pharmacy:   [place], [place] - [address] [facility] [facility] [address] [facility] [address] [place] [address] Phone: [phone] Fax:  [phone]  [id number] - [place], [place] - [address] [place] [address] Phone: [phone] Fax: [phone]     Social Determinants of Health (SDOH) Interventions    Readmission Risk Interventions 30 Day Unplanned Readmission Risk Score     ED to Hosp-Admission (Current) from 08/02/2018 in [place] [facility] [place] [facility]  30 Day Unplanned Readmission Risk Score (%)  17 Filed at 08/06/2018 1200     This score is the patient's risk of an unplanned readmission within 30 days of being discharged (0 -100%). The score is based on dignosis, age, lab data, medications, orders, and past utilization.   Low:  0-14.9   Medium: 15-21.9   High: 22-29.9   Extreme: 30 and above       No flowsheet data found.

## 2018-08-06 NOTE — Discharge Summary (Addendum)
Physician Discharge Summary  TALITA RECHT FWY:[id number] DOB: [birth year] DOA: 08/02/2018  PCP: Midge Minium, MD  Admit date: 08/02/2018 Discharge date: 08/06/2018  Time spent: 40 minutes  Recommendations for Outpatient Follow-up:  1. Follow-up with primary care physician in 1 week   Discharge Diagnoses:  Principal Problem:   Sepsis due to undetermined organism [facility]) Active Problems:   Metastatic breast cancer ([place])   Anemia due to chemotherapy   Hypomagnesemia   Mild protein malnutrition (HCC)   Elevated alkaline phosphatase level   Thrombocytopenia ([place])   Pulmonary hypertension ([place])   Discharge Condition: Stable  Diet recommendation: Cardiac  Filed Weights   08/02/18 2147 08/06/18 0608  Weight: 86.7 kg 86.7 kg    History of present illness:  [name] a 63 y.o.femalewith medical history significant ofarthritis, 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, currently on chemotherapy, mitral valve prolapse, chemotherapy induced peripheral neuropathy, 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 There is no travel history or known sick contacts. She says she has some mild right earache. She denies rhinorrhea, sore throat or hemoptysis. She gets postinfusion erythema urticaria extending from her side of her Port-A-Cath.  ED Course:Initial temperature was 100.6 F, pulse 110, respirations 18, blood pressure 139/77 mmHg and O2 sat 92% on room air. She received supplemental oxygen, cefepime, azithromycin and  vancomycin .  Hospital Course:  ##Sepsis due to pneumonia present on admission -Admitted with a fever of 101, lactic acid of 9.5, tachycardia -Suspect developing HCAP considering patient's cough, shortness of breath, hypoxia. -Follow-up with blood, sputum cultures-negative to date -Discontinued  vancomycin, continue with cefepime, azithromycin -Patient has allergy to penicillins, discharged with levofloxacin 750 mg for 4 days   ##Pneumonia healthcare associated -Was treated with vancomycin, cefepime, azithromycin.  MRSA screen came back to be negative.  Patient had significant improvement with cough, shortness of breath.  Patient is discharged with the levofloxacin.  Patient is recommended to follow-up with primary care physician in 1 week  ##Acute hypoxic respiratory failure -Patient at baseline does not use oxygen -Patient's oxygen saturations trended down to 82% with ambulation, required 4 L to bring up oxygen saturations to 92%  ##COPD exacerbation Treated with duo nebs, Solu-Medrol, significant improvement.  Patient is discharged with prednisone for additional 5 days.  ##Metastatic breast cancer ([place]) -Received chemotherapy about 10 days ago. -She was recently at [facility] on 07/29/2018 for clinical trial enrollment. -Protective precautions given immunosuppressed state.  Patient will follow-up with oncology as an outpatient  ##Pulmonary hypertension ([place]) -Continue sildenafil 40 mg p.o. 3 times daily.  ##Anemia due to chemotherapy -Monitor hematocrit and hemoglobin. -Hemoglobin stable  ##Hypomagnesemia -Replace by IV as oral magnesium is causing diarrhea  ##Mild protein malnutrition ([place]) -Secondary to malignancy and chemotherapy. -Consult nutritional services.  ##Elevated alkaline phosphatase level Likely due to chest wall metastasis. Follow-up level as needed.  ##Thrombocytopenia ([place]) -Likely due to chemotherapy. -Monitor platelet  level.    Discharge Exam: Vitals:   08/06/18 0809 08/06/18 0837  BP:  117/63  Pulse: (!) 103 (!) 105  Resp: 16 17  Temp:  98.2 F (36.8 C)  SpO2: 93% 95%    General exam: Appears calm and comfortable. Respiratory system:  Bilateral clear to auscultation  cardiovascular system: S1 & S2 heard, RRR. No JVD, murmurs, rubs, gallops or clicks. No pedal edema. Gastrointestinal system: Abdomen is nondistended, soft and nontender. No organomegaly or masses felt. Normal bowel sounds heard. Central nervous system: Alert and oriented. No focal neurological deficits. Extremities: Symmetric 5 x 5 power. Skin: Erythema on the chest right more than the left.  Patient states she has baseline erythema no worsening in the last few days  psychiatry: Judgement and insight appear normal. Mood & affect appropriate.   Discharge Instructions   Discharge Instructions    Diet - low sodium heart healthy   Complete by:  As directed    Increase activity slowly   Complete by:  As directed      Allergies as of 08/06/2018      Reactions   Penicillins Hives, Itching, Rash, Other (See Comments)   Patient has previously tolerated cefazolin, ceftriaxone, and cefepime PATIENT HAS HAD A PCN REACTION WITH IMMEDIATE RASH, FACIAL/TONGUE/THROAT SWELLING, SOB, OR LIGHTHEADEDNESS WITH HYPOTENSION:  #  #  YES  #  #  Has patient had a PCN reaction causing severe rash involving mucus membranes or skin necrosis: No Has patient had a PCN reaction that required hospitalization No Has patient had a PCN reaction occurring within the last 10 years: yes Denies airway involvement   Tape Hives, Other (See Comments)   Can tolerate paper and adhesive,  NO MEDIPORE TAPE   Other Rash, Other (See Comments)   STERI STRIPS - Blisters      Medication List    STOP taking these medications   doxycycline 100 MG capsule Commonly known as:  MONODOX     TAKE these medications   acetaminophen 500 MG tablet Commonly known as:   TYLENOL Take 500 mg by mouth every 8 (eight) hours as needed for mild pain or headache.   apixaban 5 MG Tabs tablet Commonly known as:  Eliquis Take 1 tablet (5 mg total) by mouth 2 (two) times daily.   celecoxib 200 MG capsule Commonly known as:  CELEBREX Take 1 capsule (200 mg total) by mouth 2 (two) times daily as needed for moderate pain. What changed:  when to take this   cetirizine 10 MG tablet Commonly known as:  ZYRTEC Take 1 tablet (10 mg total) daily by mouth.   diphenoxylate-atropine 2.5-0.025 MG tablet Commonly known as:  LOMOTIL Take 1 tablet by mouth 4 (four) times daily as needed for diarrhea or loose stools.   gabapentin 300 MG capsule Commonly known as:  NEURONTIN Take 1 capsule (300 mg total) by mouth at bedtime. What changed:  when to take this   levofloxacin 750 MG tablet Commonly known as:  Levaquin Take 1 tablet (750 mg total) by mouth daily for 4 doses.   mirabegron ER 25 MG Tb24 tablet Commonly known as:  Myrbetriq Take 1 tablet (25 mg total) by mouth daily.   ondansetron 8 MG tablet Commonly known as:  ZOFRAN TAKE 1 TABLET BY MOUTH EVERY 8 HOURS AS NEEDED FOR NAUSEA. What changed:    reasons to take this  additional instructions   predniSONE 20 MG tablet Commonly known as:  DELTASONE Take 1 tablet (20 mg total) by mouth 2 (two) times daily with a meal.   prochlorperazine 10 MG tablet Commonly known as:  COMPAZINE Take 10  mg by mouth every 6 (six) hours as needed for nausea or vomiting.   sildenafil 20 MG tablet Commonly known as:  REVATIO Take 2 tablets (40 mg total) by mouth 3 (three) times daily.   Vitamin D3 50 MCG (2000 UT) Tabs Take 2,000 Units by mouth 4 (four) times a week.   zolpidem 5 MG tablet Commonly known as:  AMBIEN TAKE 1 TABLET BY MOUTH ONCE DAILY AT BEDTIME AS NEEDED FOR SLEEP What changed:    how much to take  how to take this  when to take this  reasons to take this  additional instructions       Allergies  Allergen Reactions  . Penicillins Hives, Itching, Rash and Other (See Comments)    Patient has previously tolerated cefazolin, ceftriaxone, and cefepime  PATIENT HAS HAD A PCN REACTION WITH IMMEDIATE RASH, FACIAL/TONGUE/THROAT SWELLING, SOB, OR LIGHTHEADEDNESS WITH HYPOTENSION:  #  #  YES  #  #  Has patient had a PCN reaction causing severe rash involving mucus membranes or skin necrosis: No Has patient had a PCN reaction that required hospitalization No Has patient had a PCN reaction occurring within the last 10 years: yes Denies airway involvement   . Tape Hives and Other (See Comments)    Can tolerate paper and adhesive,  NO MEDIPORE TAPE  . Other Rash and Other (See Comments)    STERI STRIPS - Blisters   Follow-up Information    Midge Minium, MD. Schedule an appointment as soon as possible for a visit in 1 week(s).   Specialty:  Family Medicine Contact information: [address] [place] [address] [phone]            The results of significant diagnostics from this hospitalization (including imaging, microbiology, ancillary and laboratory) are listed below for reference.    Significant Diagnostic Studies: Dg Chest 2 View  Result Date: 08/02/2018 CLINICAL DATA:  Shortness of breath.  Productive cough and fever. EXAM: CHEST - 2 VIEW COMPARISON:  Chest x-ray Oct 16, 2017 and CT scan May 07, 2017 FINDINGS: Superior retraction of the prominent left hilum is stable. The heart, hila, mediastinum, and pleura are unchanged. The right Port-A-Cath is stable terminating in the central SVC. No pulmonary nodules or masses. No focal infiltrates. No overt edema. IMPRESSION: No interval change. Electronically Signed   By: Dorise Bullion III M.D   On: 08/02/2018 18:30    Microbiology: Recent Results (from the past 240 hour(s))  Blood Culture (routine x 2)     Status: None (Preliminary result)   Collection Time: 08/02/18  5:41 PM  Result Value Ref  Range Status   Specimen Description BLOOD RIGHT WRIST  Final   Special Requests   Final    BOTTLES DRAWN AEROBIC AND ANAEROBIC Blood Culture results may not be optimal due to an inadequate volume of blood received in culture bottles   Culture   Final    NO GROWTH 3 DAYS Performed at [facility], [place] [address], [place], [place] [address]    Report Status PENDING  Incomplete  Blood Culture (routine x 2)     Status: None (Preliminary result)   Collection Time: 08/02/18  5:57 PM  Result Value Ref Range Status   Specimen Description BLOOD RIGHT THUMB  Final   Special Requests   Final    BOTTLES DRAWN AEROBIC ONLY Blood Culture results may not be optimal due to an inadequate volume of blood received in culture  bottles   Culture   Final    NO GROWTH 3 DAYS Performed at [facility], [place] [address], [place], [place] [address]    Report Status PENDING  Incomplete  Respiratory Panel by PCR     Status: Abnormal   Collection Time: 08/02/18  8:44 PM  Result Value Ref Range Status   Adenovirus NOT DETECTED NOT DETECTED Final   Coronavirus 229E NOT DETECTED NOT DETECTED Final    Comment: (NOTE) The Coronavirus on the Respiratory Panel, DOES NOT test for the novel  Coronavirus (2019 nCoV)    Coronavirus HKU1 NOT DETECTED NOT DETECTED Final   Coronavirus NL63 NOT DETECTED NOT DETECTED Final   Coronavirus OC43 NOT DETECTED NOT DETECTED Final   Metapneumovirus DETECTED (A) NOT DETECTED Final   Rhinovirus / Enterovirus NOT DETECTED NOT DETECTED Final   Influenza A NOT DETECTED NOT DETECTED Final   Influenza B NOT DETECTED NOT DETECTED Final   Parainfluenza Virus 1 NOT DETECTED NOT DETECTED Final   Parainfluenza Virus 2 NOT DETECTED NOT DETECTED Final   Parainfluenza Virus 3 NOT DETECTED NOT DETECTED Final   Parainfluenza Virus 4 NOT DETECTED NOT DETECTED Final   Respiratory Syncytial Virus NOT DETECTED NOT DETECTED Final   Bordetella pertussis NOT DETECTED NOT DETECTED Final    Chlamydophila pneumoniae NOT DETECTED NOT DETECTED Final   Mycoplasma pneumoniae NOT DETECTED NOT DETECTED Final    Comment: Performed at [facility], [address] [place], [place] [address]  MRSA PCR Screening     Status: None   Collection Time: 08/02/18  9:51 PM  Result Value Ref Range Status   MRSA by PCR NEGATIVE NEGATIVE Final    Comment:        The GeneXpert MRSA Assay (FDA approved for NASAL specimens only), is one component of a comprehensive MRSA colonization surveillance program. It is not intended to diagnose MRSA infection nor to guide or monitor treatment for MRSA infections. Performed at [facility], [place] [address], [place],  [address]      Labs: Basic Metabolic Panel: Recent Labs  Lab 08/02/18 1740 08/02/18 2100 08/03/18 0001 08/05/18 0223  NA 135  --  139 139  K 3.9  --  3.5 4.3  CL 105  --  109 108  CO2 20*  --  24 25  GLUCOSE 98  --  105* 149*  BUN 14  --  10 11  CREATININE 0.72  --  0.67 0.68  CALCIUM 8.4*  --  7.9* 8.4*  MG  --  1.6*  --   --   PHOS  --  3.5  --   --    Liver Function Tests: Recent Labs  Lab 08/02/18 1740 08/03/18 0001  AST 38 30  ALT 25 21  ALKPHOS 152* 138*  BILITOT 1.0 0.9  PROT 6.0* 5.4*  ALBUMIN 3.0* 2.6*   No results for input(s): LIPASE, AMYLASE in the last 168 hours. No results for input(s): AMMONIA in the last 168 hours. CBC: Recent Labs  Lab 08/02/18 1740 08/03/18 0001 08/04/18 0248 08/05/18 0223 08/06/18 0304  WBC 4.4 4.2 3.4* 2.2* 4.9  NEUTROABS 3.4 3.2 2.5 1.8 4.3  HGB 11.3* 9.7* 9.4* 9.7* 9.7*  HCT 36.9 30.1* 30.5* 30.7* 29.3*  MCV 99.7 96.5 96.5 95.6 95.1  PLT 136* 123* 112* 116* 171   Cardiac Enzymes: No results for input(s): CKTOTAL, CKMB, CKMBINDEX, TROPONINI in the last 168 hours. BNP: BNP (last 3 results) No results  for input(s): BNP in the last 8760 hours.  ProBNP (last 3 results) No results for input(s): PROBNP in the last 8760 hours.  CBG: No results for  input(s): GLUCAP in the last 168 hours.     SignedMonica Becton MD.  [facility] 08/06/2018, 1:19 PM

## 2018-08-07 LAB — CULTURE, BLOOD (ROUTINE X 2)
Culture: NO GROWTH
Culture: NO GROWTH

## 2018-08-09 ENCOUNTER — Other Ambulatory Visit: Payer: Self-pay | Admitting: *Deleted

## 2018-08-09 ENCOUNTER — Telehealth: Payer: Self-pay

## 2018-08-09 NOTE — Patient Outreach (Addendum)
[facility]) Care Management  08/09/2018  [name] March 31, 1956 [id number]    Transition of care call   Referral received: 08/09/2018 Initial outreach:08/09/2018 Insurance: [place]    Subjective: Initial successful telephone call to patient's preferred number in order to complete transition of care assessment; 2 HIPAA identifiers verified. Explained purpose of call and completed transition of care assessment.  States she is doing well, denies post op problems, states surgical pain well managed with prescribed medications (did not wish to review her discharge medication list) however verified she has all her prescribed medications and taking accordingly to how they were prescribed. Pt is tolerating  diet , denies bowel or bladder problems.  Assistance available if pt continues to need assistance.      Objective:  [name] was hospitalized at [facility] from 08/02/2018-08/06/2018 for Comorbidities include: pulmonary hypertension, SOB, sinus tachycardia and thrombocytopenia. She was discharged to home on 08/06/2018 without the need for home health services or DME.   Assessment:  Patient voices good understanding of all discharge instructions.  See transition of care flowsheet for assessment details.   Plan:  No ongoing care management needs identified so will close case to [place] [facility] care management services and route successful outreach letter with [place] Management pamphlet and 24 Hour Nurse Line Magnet to [place] Management clinical pool to be mailed to patient's home address.    [name], RN Care Management Coordinator [place] Office [phone]

## 2018-08-09 NOTE — Telephone Encounter (Signed)
Transition Care Management Follow-up Telephone Call  Admit date: 08/02/2018 Discharge date: 08/06/2018 Principal Problem:  Sepsis due to undetermined organism    How have you been since you were released from the hospital? "Pretty good"   Do you understand why you were in the hospital? yes   Do you understand the discharge instructions? yes   Where were you discharged to? Home. Resides with husband.    Items Reviewed:  Medications reviewed: yes. Discharged on 2 Liters of Oxygen.   Allergies reviewed: yes  Dietary changes reviewed: yes  Referrals reviewed: yes   Functional Questionnaire:   Activities of Daily Living (ADLs):   She states they are independent in the following: ambulation, bathing and hygiene, feeding, continence, grooming, toileting and dressing States they require assistance with the following: None.    Any transportation issues/concerns?: no   Any patient concerns? no   Confirmed importance and date/time of follow-up visits scheduled yes  Provider Appointment booked with PCP on 08/12/2018  Confirmed with patient if condition begins to worsen call PCP or go to the ER.  Patient was given the office number and encouraged to call back with question or concerns.  : yes

## 2018-08-10 ENCOUNTER — Telehealth: Payer: Self-pay | Admitting: Family Medicine

## 2018-08-10 MED FILL — GABAPENTIN 300 MG CAPSULE: 300 | 30 days supply | Qty: 90 | Fill #0 | Status: TO

## 2018-08-10 NOTE — Telephone Encounter (Signed)
Please advise? I am not sure how we can get her bigger o2 tanks without seeing her first.

## 2018-08-10 NOTE — Telephone Encounter (Signed)
Copied from [place] [phone]. Topic: Quick Communication - Home Health Verbal Orders >> Aug 10, 2018 12:00 PM [place] wrote: [facility] making a change that is upsetting pt Please call pt back at cell [phone] [name] was hospitalized this past week end and has a condenser of Oxygen at home that is very portable. She has been called this am and told they are replacing what the hospital sent her home with this past week with smaller condensers that will support only 2 hours at a time. She has to make frequent trips to [facility]  and is agitated that this may cause additional problems.  She wants to talk to Tabori's nurse about other options that maybe Dr Birdie Riddle can suggest besides this one.

## 2018-08-11 MED FILL — MYRBETRIQ ER 50 MG TABLET: 50 | 30 days supply | Qty: 30 | Fill #4 | Status: TO

## 2018-08-11 MED FILL — DOXYCYCLINE MONO 100 MG CAP: 100 | 30 days supply | Qty: 60 | Fill #6 | Status: TO

## 2018-08-11 NOTE — Telephone Encounter (Signed)
Wrote Darlina Guys with [facility] to find out what we need to do to get bigger O2 tank for pt.

## 2018-08-11 NOTE — Telephone Encounter (Signed)
Pt definitely needs the bigger tank. We have an appt upcoming that will count as a face-to-face but please call [facility] and try and get her the biggest tank available

## 2018-08-11 NOTE — Telephone Encounter (Signed)
Spoke with Melissa with [facility] she said she was going to reach out tot pt. Called pt to advise and she stated that she had already ben contacted and they are coming today to switch out her O2 tanks.

## 2018-08-12 ENCOUNTER — Ambulatory Visit (INDEPENDENT_AMBULATORY_CARE_PROVIDER_SITE_OTHER): Payer: 59 | Admitting: Family Medicine

## 2018-08-12 ENCOUNTER — Other Ambulatory Visit: Payer: Self-pay

## 2018-08-12 ENCOUNTER — Encounter: Payer: Self-pay | Admitting: Family Medicine

## 2018-08-12 VITALS — BP 121/74 | HR 99 | Temp 97.7°F | Resp 17 | Ht 67.0 in | Wt 190.5 lb

## 2018-08-12 DIAGNOSIS — R0902 Hypoxemia: Secondary | ICD-10-CM | POA: Diagnosis not present

## 2018-08-12 DIAGNOSIS — Z8619 Personal history of other infectious and parasitic diseases: Secondary | ICD-10-CM | POA: Diagnosis not present

## 2018-08-12 DIAGNOSIS — A419 Sepsis, unspecified organism: Secondary | ICD-10-CM

## 2018-08-12 NOTE — Assessment & Plan Note (Signed)
Resolved.  Pt has completed abx and course of prednisone.  Reports feeling well.  No cough at this time.

## 2018-08-12 NOTE — Progress Notes (Signed)
   Subjective:    Patient ID: [name], female    DOB: 1955/06/26, 63 y.o.   MRN: [id number]  [facility] f/u- pt was admitted 3/9-13 w/ sepsis from unknown source.  Lactice acid was 9.5 and fever was 101.  Her IV abx were stopped and she was d/c'd on Levaquin and Prednisone.  She has new O2 requirement.  She was negative for flu, respiratory panel was + for metapneumovirus.  No COVID testing was done.  Xray was w/o infiltrate or edema despite clinical PNA.    Pt reports today, 'I feel fine'.  No longer coughing or wheezing.  Pt will remove O2 for ~1 hr to shower and get dressed.  O2 remains at 93%.  She is not sure whether to continue oxygen or wean.  Currently at 2L.  Finished Prednisone yesterday and abx on Monday.  No fevers.  Using incentive spirometer.  Reviewed d/c summary, imaging, labs, and notes from hospitalization.  Reviewed past medical, surgical, family and social histories.    Review of Systems For ROS see HPI     Objective:   Physical Exam Vitals signs reviewed.  Constitutional:      General: She is not in acute distress.    Appearance: She is well-developed.  HENT:     Head: Normocephalic and atraumatic.  Eyes:     Conjunctiva/sclera: Conjunctivae normal.     Pupils: Pupils are equal, round, and reactive to light.  Neck:     Musculoskeletal: Normal range of motion and neck supple.     Thyroid: No thyromegaly.  Cardiovascular:     Rate and Rhythm: Normal rate and regular rhythm.     Heart sounds: Murmur (II/VI SEM) present.  Pulmonary:     Effort: Pulmonary effort is normal. No respiratory distress.     Breath sounds: Normal breath sounds.  Abdominal:     General: There is no distension.     Palpations: Abdomen is soft.     Tenderness: There is no abdominal tenderness.  Lymphadenopathy:     Cervical: No cervical adenopathy.  Skin:    General: Skin is warm and dry.  Neurological:     Mental Status: She is alert and oriented to person, place, and time.   Psychiatric:        Behavior: Behavior normal.           Assessment & Plan:  Hypoxia- pt was d/c'd from hospital w/ new O2 requirement.  She is doing well on 2L and would like to try and wean.  Pt was given instructions and parameters on how to do so.  Pt expressed understanding and is in agreement w/ plan.

## 2018-08-12 NOTE — Patient Instructions (Signed)
Follow up as needed or as scheduled Decrease the O2 to 1L and monitor your O2 sats If things are looking good on 1L, stop the O2 and monitor sats As long as things remain above 88% with activity we're in good shape Call with any questions or concerns STAY SAFE!!

## 2018-08-17 DIAGNOSIS — C792 Secondary malignant neoplasm of skin: Secondary | ICD-10-CM | POA: Diagnosis not present

## 2018-08-17 DIAGNOSIS — Z5112 Encounter for antineoplastic immunotherapy: Secondary | ICD-10-CM | POA: Diagnosis not present

## 2018-08-17 DIAGNOSIS — R748 Abnormal levels of other serum enzymes: Secondary | ICD-10-CM | POA: Diagnosis not present

## 2018-08-17 DIAGNOSIS — Z171 Estrogen receptor negative status [ER-]: Secondary | ICD-10-CM | POA: Diagnosis not present

## 2018-08-17 DIAGNOSIS — R0609 Other forms of dyspnea: Secondary | ICD-10-CM | POA: Diagnosis not present

## 2018-08-17 DIAGNOSIS — Z006 Encounter for examination for normal comparison and control in clinical research program: Secondary | ICD-10-CM | POA: Diagnosis not present

## 2018-08-17 DIAGNOSIS — C50912 Malignant neoplasm of unspecified site of left female breast: Secondary | ICD-10-CM | POA: Diagnosis not present

## 2018-08-17 DIAGNOSIS — G629 Polyneuropathy, unspecified: Secondary | ICD-10-CM | POA: Diagnosis not present

## 2018-08-17 DIAGNOSIS — Z5111 Encounter for antineoplastic chemotherapy: Secondary | ICD-10-CM | POA: Diagnosis not present

## 2018-08-17 DIAGNOSIS — C50412 Malignant neoplasm of upper-outer quadrant of left female breast: Secondary | ICD-10-CM | POA: Diagnosis not present

## 2018-08-20 ENCOUNTER — Ambulatory Visit (INDEPENDENT_AMBULATORY_CARE_PROVIDER_SITE_OTHER): Payer: 59 | Admitting: Family Medicine

## 2018-08-20 ENCOUNTER — Encounter (HOSPITAL_COMMUNITY): Payer: Self-pay

## 2018-08-20 ENCOUNTER — Encounter: Payer: Self-pay | Admitting: Family Medicine

## 2018-08-20 ENCOUNTER — Other Ambulatory Visit: Payer: Self-pay

## 2018-08-20 ENCOUNTER — Emergency Department (HOSPITAL_COMMUNITY)
Admission: EM | Admit: 2018-08-20 | Discharge: 2018-08-20 | Disposition: A | Discharge status: Home / Self Care | Payer: 59 | Attending: Emergency Medicine | Admitting: Emergency Medicine

## 2018-08-20 ENCOUNTER — Emergency Department (HOSPITAL_COMMUNITY): Payer: 59

## 2018-08-20 VITALS — Temp 99.4°F | Wt 189.0 lb

## 2018-08-20 DIAGNOSIS — Z7901 Long term (current) use of anticoagulants: Secondary | ICD-10-CM | POA: Diagnosis not present

## 2018-08-20 DIAGNOSIS — Z79899 Other long term (current) drug therapy: Secondary | ICD-10-CM | POA: Insufficient documentation

## 2018-08-20 DIAGNOSIS — R509 Fever, unspecified: Secondary | ICD-10-CM

## 2018-08-20 DIAGNOSIS — D709 Neutropenia, unspecified: Secondary | ICD-10-CM | POA: Insufficient documentation

## 2018-08-20 DIAGNOSIS — R0902 Hypoxemia: Secondary | ICD-10-CM

## 2018-08-20 DIAGNOSIS — J188 Other pneumonia, unspecified organism: Secondary | ICD-10-CM | POA: Diagnosis not present

## 2018-08-20 DIAGNOSIS — Z853 Personal history of malignant neoplasm of breast: Secondary | ICD-10-CM | POA: Diagnosis not present

## 2018-08-20 DIAGNOSIS — R5081 Fever presenting with conditions classified elsewhere: Secondary | ICD-10-CM | POA: Diagnosis not present

## 2018-08-20 LAB — URINALYSIS, ROUTINE W REFLEX MICROSCOPIC
Bilirubin Urine: NEGATIVE
Glucose, UA: NEGATIVE mg/dL
Hgb urine dipstick: NEGATIVE
Ketones, ur: NEGATIVE mg/dL
Leukocytes,Ua: NEGATIVE
Nitrite: NEGATIVE
PROTEIN: NEGATIVE mg/dL
Specific Gravity, Urine: 1.023 (ref 1.005–1.030)
pH: 5 (ref 5.0–8.0)

## 2018-08-20 LAB — CBC WITH DIFFERENTIAL/PLATELET
Abs Immature Granulocytes: 0.01 10*3/uL (ref 0.00–0.07)
Basophils Absolute: 0 10*3/uL (ref 0.0–0.1)
Basophils Relative: 1 %
Eosinophils Absolute: 0.1 10*3/uL (ref 0.0–0.5)
Eosinophils Relative: 7 %
HCT: 32.6 % — ABNORMAL LOW (ref 36.0–46.0)
Hemoglobin: 10.1 g/dL — ABNORMAL LOW (ref 12.0–15.0)
IMMATURE GRANULOCYTES: 1 %
Lymphocytes Relative: 11 %
Lymphs Abs: 0.2 10*3/uL — ABNORMAL LOW (ref 0.7–4.0)
MCH: 29.7 pg (ref 26.0–34.0)
MCHC: 31 g/dL (ref 30.0–36.0)
MCV: 95.9 fL (ref 80.0–100.0)
MONOS PCT: 7 %
Monocytes Absolute: 0.1 10*3/uL (ref 0.1–1.0)
NEUTROS PCT: 73 %
Neutro Abs: 1.1 10*3/uL — ABNORMAL LOW (ref 1.7–7.7)
Platelets: 58 10*3/uL — ABNORMAL LOW (ref 150–400)
RBC: 3.4 MIL/uL — ABNORMAL LOW (ref 3.87–5.11)
RDW: 17.2 % — ABNORMAL HIGH (ref 11.5–15.5)
WBC: 1.4 10*3/uL — CL (ref 4.0–10.5)
nRBC: 0 % (ref 0.0–0.2)

## 2018-08-20 LAB — COMPREHENSIVE METABOLIC PANEL
ALT: 32 U/L (ref 0–44)
AST: 39 U/L (ref 15–41)
Albumin: 3 g/dL — ABNORMAL LOW (ref 3.5–5.0)
Alkaline Phosphatase: 122 U/L (ref 38–126)
Anion gap: 9 (ref 5–15)
BUN: 18 mg/dL (ref 8–23)
CO2: 25 mmol/L (ref 22–32)
Calcium: 8.5 mg/dL — ABNORMAL LOW (ref 8.9–10.3)
Chloride: 103 mmol/L (ref 98–111)
Creatinine, Ser: 0.78 mg/dL (ref 0.44–1.00)
GFR calc Af Amer: >60 mL/min (ref >60)
Glucose, Bld: 113 mg/dL — ABNORMAL HIGH (ref 70–99)
Potassium: 4 mmol/L (ref 3.5–5.1)
Sodium: 137 mmol/L (ref 135–145)
TOTAL PROTEIN: 6.1 g/dL — AB (ref 6.5–8.1)
Total Bilirubin: 0.8 mg/dL (ref 0.3–1.2)

## 2018-08-20 LAB — LACTIC ACID, PLASMA: Lactic Acid, Venous: 1.6 mmol/L (ref 0.5–1.9)

## 2018-08-20 LAB — INFLUENZA PANEL BY PCR (TYPE A & B)
Influenza A By PCR: NEGATIVE
Influenza B By PCR: NEGATIVE

## 2018-08-20 NOTE — Progress Notes (Signed)
Spoke with Namon Cirri at [facility]. Discussed patients symptoms, visit today with PCP and recommendation request.  Almyra Free is to discuss patient/plan with Viviann Spare, NP.  Awaiting return call.

## 2018-08-20 NOTE — Progress Notes (Signed)
Virtual Visit via Video Note  I connected with [name] on 08/20/18 at 10:30 AM EDT by a video enabled telemedicine application and verified that I am speaking with the correct person using two identifiers.   I discussed the limitations of evaluation and management by telemedicine and the availability of in person appointments. The patient expressed understanding and agreed to proceed.  Pt is at home and I am in office  Interactive audio and video telecommunications were attempted between this provider and patient, however failed, due to patient having technical difficulties OR patient did not have access to video capability.  We continued and completed visit with audio only.    History of Present Illness: Fever/hypoxia- pt was admitted on 3/9 w/ hypoxia, sepsis of unknown source, and suspected PNA.  Was feeling much better until yesterday when she spiked a temp to 102.  Pt had weaned her O2 to 1L at night only.  O2 level yesterday 'just wants to hang out at 90%'.  Denies feeling SOB unless climbing stairs.  No cough.  No wheezing, rhonchi/rales.  Mild sinus pressure on R.  Some ear pain, L>R and frequently popping.  Taking Zyrtec.  No abd pain, N/V.  No diarrhea.  No urinary symptoms.  Has increased her fluid intake.  Last chemo was Tuesday.  Has been bicycling every day w/o difficulty.   Observations/Objective: Pt is able to speak clearly, coherently without shortness of breath or increased work of breathing. Thought process is linear.  Mood is appropriate.  Unable to assess coloring or other physical appearance  Assessment and Plan: Fever/Hypoxia- sxs started suddenly yesterday 2 days after receiving Chemo at [facility].  She denies cough but is febrile w/ low O2 sats.  On 1L, O2 improves to 96%.  Encouraged her to remain on O2 today.  Oncology was recommending pt be evaluated for recurrent PNA, but given that she is febrile, none of our outpt imaging centers can do a chest xray.  She would  prefer not to go to ER given the current COVID situation.  I will have RN reach out to [facility] and ask them how they would like to proceed- prophylactic abx vs other.  If they do want abx, I will defer choice of tx to them as they are aware of her regimen and any possible interactions.  Follow Up Instructions: Pending response from [facility]   I discussed the assessment and treatment plan with the patient. The patient was provided an opportunity to ask questions and all were answered. The patient agreed with the plan and demonstrated an understanding of the instructions.   The patient was advised to call back or seek an in-person evaluation if the symptoms worsen or if the condition fails to improve as anticipated.  I provided 26 minutes of non-face-to-face time during this encounter.   Annye Asa, MD

## 2018-08-20 NOTE — ED Provider Notes (Signed)
[facility] EMERGENCY DEPARTMENT Provider Note   CSN: [id number] Arrival date & time: 08/20/18  1341    History   Chief Complaint Chief Complaint  Patient presents with  . Fever    HPI [name] is a 63 y.o. female with history of metastatic breast cancer currently receiving chemotherapy at [facility] presenting today for concern of fever and new O2 dependence.  She reports that she had chemotherapy on 08/17/2018, she reports that yesterday she developed a fever of 102 F.  She took Motrin and Tylenol for her fever yesterday which quickly resolved.  She denies any fever today and has not taken any antipyretic medication.  Patient encouraged to present to the ER by PCP for further evaluation.  Additionally patient was diagnosed with pneumonia on 08/02/2018, patient reports that she was treated for this however during that time she was O2 dependent via nasal cannula at home.  Patient states that she weaned herself off of her of supplemental O2 however yesterday patient noticed her pulse oximetry was in the low 90s so she placed herself back on a 1 L of O2 via nasal cannula with improvement of saturation.  Patient denies any other concern today besides her fever yesterday and new O2 dependence, she denies recent fever/chills, chest pain/shortness of breath, abdominal pain, nausea/vomiting/diarrhea, extremity swelling, rash, headache or any additional concerns.    HPI  Past Medical History:  Diagnosis Date  . Arthritis   . Breast cancer metastasized to skin, right [facility]) followed by dr force -- oncolgoist w/ [facility]   primary left breast cancer dx 01/ 2009 ; 11/ 2009 recurrence left chest wall and neck, tx clinical trial drug and chemotherapy;  2015 recurrence cutaneous metastatized to right skin/ chest wall, 02/ 2015 resection chest wall disease and 02-23-2015 s/p skin flap surgery,  chemo every 3 weeks  . 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 Heart murmur   . 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 MVP (mitral valve prolapse)    mild mvp w/ mild regurg. per last echo 04-24-2017  . Neuropathy due to chemotherapeutic drug ([place])   . Non-healing surgical wound    right knee post re-implantation total knee arthroplasty 04-2017 unable to completely close surgical incision  . PONV (postoperative nausea and vomiting)    ponv likes scopolamine patch  . Port-A-Cath in place    RIGHT CHEST   . Red blood cell antibody positive   . Skin changes related to chemotherapy    per patient she has scabbed over skin circumventing port to right upper chest ;  state it is due to chemptherapy   . 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 Sepsis due to undetermined organism ([place]) 08/02/2018  . Hypomagnesemia 08/02/2018  . Mild protein malnutrition ([place]) 08/02/2018  . Elevated alkaline phosphatase level 08/02/2018  . Thrombocytopenia ([place]) 08/02/2018  . Pulmonary hypertension ([place]) 08/02/2018  . 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 Chronic left shoulder pain 01/15/2018  . Wound, open with complication 67/20/9470  . Anemia due to chemotherapy 05/07/2017  . Abnormal LFTs 05/07/2017  . History of breast cancer   . Bilateral leg edema   . S/P revision of total knee, right 04/27/2017  . Physical exam 07/31/2016  . S/P total knee arthroplasty, right 05/31/2016  . 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 Wound dehiscence, surgical  04/12/2016  . 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 Vitamin D deficiency 01/25/2016  . Metastatic cancer to chest wall ([place]) 02/02/2015  . Primary osteoarthritis of right knee 02/01/2015  . Symptomatic anemia 02/01/2015  . Frequent UTI 01/12/2014  . Sinus tachycardia 11/03/2013  . SOB (shortness of breath) 11/03/2013  . Neuropathy due to chemotherapeutic drug ([place]) 07/25/2011  . 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 Metastatic breast cancer ([place]) 11/12/2010  . Chronic anticoagulation 11/12/2010  . Anxiety, generalized 03/26/2008    Past Surgical History:  Procedure Laterality Date  . APPLICATION OF A-CELL OF BACK Right 07/31/2016   Procedure: CELLERATE COLLAGEN PLACEMENT;  Surgeon: Loel Lofty Dillingham, DO;  Location: [place];  Service: Plastics;  Laterality: Right;  . APPLICATION OF WOUND VAC Right 06/29/2017   Procedure: APPLICATION OF WOUND VAC;  Surgeon: Wallace Going, DO;  Location[facility];  Service: Plastics;  Laterality: Right;  . DEBRIDEMENT CHEST WALL RIGHT/ VAC PLACEMENT  02-09-2015    [facility]  . EXCISIONAL TOTAL KNEE ARTHROPLASTY WITH ANTIBIOTIC SPACERS Right 12/02/2016   Procedure: EXCISIONAL TOTAL KNEE ARTHROPLASTY WITH ANTIBIOTIC SPACERS;  Surgeon: Paralee Cancel, MD;  [facility];  Service: Orthopedics;  Laterality: Right;  90 mins  . HEMATOMA EVACUATION Right 06/29/2017   Procedure: EVACUATION HEMATOMA OF RIGHT KNEE;  Surgeon: Wallace Going, DO;  [facility];  Service: Plastics;  Laterality: Right;  . HEMATOMA EVACUATION Right 06/29/2017   Procedure: EVACUATION RIGHT TOTAL KNEE HEMATOMA;  Surgeon: Paralee Cancel, MD;  [facility];  Service: Orthopedics;  Laterality: Right;  . HEMATOMA EVACUATION Right 07/14/2017   Procedure: EVACUATION RIGHT LEG HEMATOMA WITH APPLICATION OF WOUND VAC;  Surgeon: Paralee Cancel, MD;  [facility];  Service: Orthopedics;  Laterality: Right;  . hemotoma     evacuation left chest wall  . I&D EXTREMITY Right 07/17/2017   Procedure:  EVACUATION RIGHT LEG HEMATOMA WITH WOUND VAC DRESSING CHANGE;  Surgeon: Paralee Cancel, MD;  [facility];  Service: Orthopedics;  Laterality: Right;  . I&D KNEE WITH POLY EXCHANGE Right 05/28/2016   Procedure: IRRIGATION AND DEBRIDEMENT KNEE WOUND VAC PLACMENT;  Surgeon: Susa Day, MD;  [facility];  Service: Orthopedics;  Laterality: Right;  . I&D KNEE WITH POLY EXCHANGE Right 05/30/2016   Procedure: RADICAL SYNOVECTOMY,IRRIGATION AND DEBRIDEMENT KNEE WITH POLY EXCHANGE WITH ANTIBIOTIC BEADS, APPLICATION OF WOUND VAC;  Surgeon: Susa Day, MD;  [facility];  Service: Orthopedics;  Laterality: Right;  . INCISION AND DRAINAGE OF WOUND Right 07/31/2016   Procedure: IRRIGATION AND DEBRIDEMENT RIGHT KNEE  WOUND;  Surgeon: Loel Lofty Dillingham, DO;  Location: [facility];  Service: Plastics;  Laterality: Right;  . IR FLUORO GUIDE CV LINE LEFT  04/30/2017  . IR US GUIDE VASC ACCESS LEFT  04/30/2017  . IRRIGATION AND DEBRIDEMENT KNEE Right 04/12/2016   Procedure: IRRIGATION AND DEBRIDEMENT KNEE;  Surgeon: Nicholes Stairs, MD;  [facility];  Service: Orthopedics;  Laterality: Right;  . IRRIGATION AND DEBRIDEMENT KNEE Right 12/16/2016   Procedure: Repeat irrigation and debridement right knee,  wound closure  wound vac REPLACEMENT ANTIBIOTIC SPACERS;  Surgeon: Paralee Cancel, MD;  [facility];  Service: Orthopedics;  Laterality: Right;  . KNEE ARTHROSCOPY  02/13/2012   Procedure: ARTHROSCOPY KNEE;  Surgeon: Johnn Hai, MD;  Location[facility];  Service: Orthopedics;  Laterality: Left;  WITH DEBRIDEMENt   . KNEE ARTHROSCOPY WITH LATERAL MENISECTOMY  02/13/2012   Procedure: KNEE ARTHROSCOPY WITH LATERAL MENISECTOMY;  Surgeon: Johnn Hai, MD;  Location: [facility];  Service: Orthopedics;;  partial  . LATISSIMUS FLAP RIGHT CHEST  02-23-2015   [facility]  . LEFT MODIFIED RADICAL MASTECTOMY/ RIGHT TOTAL MASTECTOMY  11-13-2007   LEFT BREAST CANCER W/ AXILLARY  LYMPH NODE METASTASIS AND POST NEOADJUVANT CHEMO  . MASS EXCISION Right 06/29/2017   Procedure: EXCISION OF METASTATIC BREAST CANCER TO SKIN RIGHT SHOULDER, PLACEMENT OF CELLERATE;  Surgeon: Wallace Going, DO;  [facility];  Service: Plastics;  Laterality: Right;  . PLACEMENT PORT-A-CATH  06/22/2007    new port placed 2015, right chest  . REIMPLANTATION OF TOTAL KNEE Right 04/27/2017   Procedure: Reimplantation of right total knee arthroplasty;  Surgeon: Paralee Cancel, MD;  [facility];  Service: Orthopedics;  Laterality: Right;  Adductor Block  . RESECTION OF ISOLATED CHEST WALL DISEASE/ ABDOMINAL SKIN FLAP  02/ 2015     [facility]   RIGHT CHEST WALL METASTATIC SKIN CARCINOMA  . RIGHT HEART CATH N/A 01/27/2018   Procedure: RIGHT HEART CATH;  Surgeon: Jolaine Artist, MD;  Location: [place] [facility];  Service: Cardiovascular;  Laterality: N/A;  . SKIN SPLIT GRAFT Right 01/29/2017   Procedure: SKIN GRAFT SPLIT THICKNESS TO RIGHT KNEE WOUND;  Surgeon: Wallace Going, DO;  [facility];  Service: Plastics;  Laterality: Right;  . SKIN SPLIT GRAFT Right 09/07/2017   Procedure: SKIN GRAFT SPLIT THICKNESS TO RIGHT KNEE WOUND WITH PLACEMENT OF VAC DRESSING TO GRAFT SITE;  Surgeon: Wallace Going, DO;  Location: [facility];  Service: Plastics;  Laterality: Right;  . TOTAL KNEE ARTHROPLASTY Left 09/23/2012   Procedure: LEFT TOTAL KNEE ARTHROPLASTY;  Surgeon: Johnn Hai, MD;  [facility];  Service: Orthopedics;  Laterality: Left;  . TOTAL KNEE ARTHROPLASTY Right 03/20/2016   Procedure: RIGHT TOTAL KNEE ARTHROPLASTY;  Surgeon: Susa Day, MD;  [facility];  Service: Orthopedics;  Laterality: Right;  . TRANSTHORACIC ECHOCARDIOGRAM  04/24/2017   ef 22-63%, grade 1 diastolic dysfuction/  mild MR with mild prolapse anterior leaflet/ mild TR/ PASP 27mmHg     OB History   No obstetric history on file.      Home Medications    Prior to Admission  medications   Medication Sig Start Date End Date Taking? Authorizing Provider  acetaminophen (TYLENOL) 500 MG tablet Take 500 mg by mouth every 8 (eight) hours as needed for mild pain or headache.   Yes [provider]  apixaban (ELIQUIS) 5 MG TABS tablet Take 1 tablet (5 mg total) by mouth 2 (two) times daily. 04/02/18  Yes Midge Minium, MD  celecoxib (CELEBREX) 200 MG capsule Take 1 capsule (200 mg total) by mouth 2 (two) times daily as needed for moderate pain. Patient taking differently: Take 200 mg by mouth daily.  06/11/18  Yes Paz, Alda Berthold, MD  cetirizine (ZYRTEC) 10 MG tablet Take 1 tablet (10 mg total) daily by mouth. 04/03/17  Yes Midge Minium, MD  Cholecalciferol (VITAMIN D3) 2000 UNITS TABS Take 2,000 Units  by mouth 4 (four) times a week.    Yes [provider]  doxycycline (MONODOX) 100 MG capsule Take 100 mg by mouth 2 (two) times daily. 08/11/18  Yes [provider]  gabapentin (NEURONTIN) 300 MG capsule Take 1 capsule (300 mg total) by mouth at bedtime. Patient taking differently: Take 300 mg by mouth 3 (three) times daily.  08/06/18  Yes Vasireddy, Grier Mitts, MD  mirabegron ER (MYRBETRIQ) 25 MG TB24 tablet Take 1 tablet (25 mg total) by mouth daily. 02/08/18  Yes Midge Minium, MD  Multiple Vitamin (MULTI-VITAMIN PO) Take 1 tablet by mouth daily.   Yes [provider]  ondansetron (ZOFRAN) 8 MG tablet TAKE 1 TABLET BY MOUTH EVERY 8 HOURS AS NEEDED FOR NAUSEA. Patient taking differently: Take 8 mg by mouth every 8 (eight) hours as needed for nausea or vomiting.  11/06/17  Yes Midge Minium, MD  oxyCODONE (OXY IR/ROXICODONE) 5 MG immediate release tablet Take 1 tablet by mouth every 4 (four) hours as needed for moderate pain.  08/13/18  Yes [provider]  prochlorperazine (COMPAZINE) 10 MG tablet Take 10 mg by mouth every 6 (six) hours as needed for nausea or vomiting.  09/12/17  Yes [provider]  sildenafil  (REVATIO) 20 MG tablet Take 2 tablets (40 mg total) by mouth 3 (three) times daily. 05/27/18  Yes Bensimhon, Shaune Pascal, MD  zolpidem (AMBIEN) 5 MG tablet TAKE 1 TABLET BY MOUTH ONCE DAILY AT BEDTIME AS NEEDED FOR SLEEP Patient not taking: Reported on 08/20/2018 09/17/17   Brunetta Jeans, PA-C    Family History Family History  Problem Relation Age of Onset  . Heart disease Mother        due to mitral valve regurgiation,   . Cancer Mother        breast  . Allergic rhinitis Mother   . Parkinsonism Father   . Allergic rhinitis Father   . Migraines Father   . Alcohol abuse Paternal Grandfather   . Angioedema Neg Hx   . Asthma Neg Hx   . Eczema Neg Hx     Social History Social History   Tobacco Use  . Smoking status: Never Smoker  . Smokeless tobacco: Never Used  Substance Use Topics  . Alcohol use: No  . Drug use: No     Allergies   Penicillins; Tape; and Other   Review of Systems Review of Systems  Constitutional: Positive for fever. Negative for chills and diaphoresis.  HENT: Negative.  Negative for congestion, rhinorrhea and voice change.   Respiratory: Negative for cough and shortness of breath.        Need for supplemental O2.  Cardiovascular: Negative.  Negative for chest pain.  Gastrointestinal: Negative.  Negative for abdominal pain, diarrhea, nausea and vomiting.  Musculoskeletal: Negative.  Negative for arthralgias and myalgias.  Neurological: Negative.  Negative for syncope, weakness and headaches.  All other systems reviewed and are negative.    Physical Exam Updated Vital Signs BP 97/60   Pulse (!) 106   Temp 98.2 F (36.8 C)   Resp 19   Ht 5\' 7"  (1.702 m)   Wt 86.6 kg   SpO2 99%   BMI 29.91 kg/m   Physical Exam Constitutional:      General: She is not in acute distress.    Appearance: Normal appearance. She is well-developed. She is not ill-appearing or diaphoretic.  HENT:     Head: Normocephalic and atraumatic.     Right  Ear: External ear  normal.     Left Ear: External ear normal.     Nose: Nose normal.  Eyes:     General: Vision grossly intact. Gaze aligned appropriately.     Pupils: Pupils are equal, round, and reactive to light.  Neck:     Musculoskeletal: Normal range of motion.     Trachea: Trachea and phonation normal. No tracheal deviation.  Cardiovascular:     Rate and Rhythm: Regular rhythm. Tachycardia present.     Pulses: Normal pulses.          Dorsalis pedis pulses are 2+ on the right side and 2+ on the left side.       Posterior tibial pulses are 2+ on the right side and 2+ on the left side.     Heart sounds: Normal heart sounds.  Pulmonary:     Effort: Pulmonary effort is normal. No respiratory distress.     Breath sounds: Normal breath sounds and air entry.  Abdominal:     General: There is no distension.     Palpations: Abdomen is soft.     Tenderness: There is no abdominal tenderness. There is no guarding or rebound.  Musculoskeletal: Normal range of motion.     Comments: Patient with mild 1+ bilateral leg edema, she states that this is chronic and without recent change.  Skin:    General: Skin is warm and dry.  Neurological:     Mental Status: She is alert.     GCS: GCS eye subscore is 4. GCS verbal subscore is 5. GCS motor subscore is 6.     Comments: Speech is clear and goal oriented, follows commands Major Cranial nerves without deficit, no facial droop Moves extremities without ataxia, coordination intact  Psychiatric:        Behavior: Behavior normal.      ED Treatments / Results  Labs (all labs ordered are listed, but only abnormal results are displayed) Labs Reviewed  CULTURE, BLOOD (ROUTINE X 2)  CULTURE, BLOOD (ROUTINE X 2)  URINE CULTURE  LACTIC ACID, PLASMA  CBC WITH DIFFERENTIAL/PLATELET  COMPREHENSIVE METABOLIC PANEL  LACTIC ACID, PLASMA  URINALYSIS, ROUTINE W REFLEX MICROSCOPIC  INFLUENZA PANEL BY PCR (TYPE A & B)    EKG None  Radiology Dg Chest Portable 1 View   Result Date: 08/20/2018 CLINICAL DATA:  Pneumonia EXAM: PORTABLE CHEST 1 VIEW COMPARISON:  Chest CT 05/07/2017 and CXR 08/02/2018 FINDINGS: Stable cardiomegaly with aortic atherosclerosis and retracted appearance of the left hilum counting for the curvilinear density noted projecting over the left upper lung. No acute pulmonary consolidation, effusion or edema. Numerous surgical clips project over the axilla and chest wall. Port catheter tip terminates in proximal right atrium. No aggressive osseous lesions. IMPRESSION: Stable appearance of the chest with cardiomegaly, aortic atherosclerosis and retracted appearance of the left hilum. Based on prior CT, the curvilinear appearance of the upper retracted left hilum is believed secondary to pulmonary vasculature. No acute pulmonary consolidation to suggest pneumonia. Electronically Signed   By: Ashley Royalty M.D.   On: 08/20/2018 14:46    Procedures Procedures (including critical care time)  Medications Ordered in ED Medications - No data to display   Initial Impression / Assessment and Plan / ED Course  I have reviewed the triage vital signs and the nursing notes.  Pertinent labs & imaging results that were available during my care of the patient were reviewed by me and considered in my medical decision making (see chart  for details).    63 year old female presenting with fever yesterday, new O2 use today, recent chemotherapy 3 days ago.  On arrival patient overall well-appearing in no acute distress.  Patient mildly tachycardic, no hypoxia with 1 L via nasal cannula.  Afebrile here.  Patient without chest pain or shortness of breath.  Low suspicion for ACS, PE or dissection as etiology of need for 1 L of O2 via nasal cannula.  CMP unremarkable Chest x-ray: IMPRESSION: Stable appearance of the chest with cardiomegaly, aortic atherosclerosis and retracted appearance of the left hilum. Based on prior CT, the curvilinear appearance of the upper  retracted left hilum is believed secondary to pulmonary vasculature. No acute pulmonary consolidation to suggest pneumonia. - Patient reassessed resting comfortably in no acute distress.  Remainder of blood work is still pending care handoff given to [employer] PA-C at shift change.  Suspect that if patient's lab work results are without acute finding patient likely can be tested for COVID-19 and discharged with isolation precautions and return precautions.   Patient's case discussed with Dr. Ronnald Nian who agrees with workup at this time.  Note: Portions of this report may have been transcribed using voice recognition software. Every effort was made to ensure accuracy; however, inadvertent computerized transcription errors may still be present.  Final Clinical Impressions(s) / ED Diagnoses   Final diagnoses:  None    ED Discharge Orders    None       Gari Crown 08/20/18 1601    Lennice Sites, DO 08/20/18 1727

## 2018-08-20 NOTE — ED Provider Notes (Signed)
  Physical Exam  BP 118/70   Pulse (!) 101   Temp 98.2 F (36.8 C)   Resp 19   Ht 5\' 7"  (1.702 m)   Wt 86.6 kg   SpO2 96%   BMI 29.91 kg/m   Physical Exam   Gen: appears nontoxic CV: mildly tachy, 95-105 Pulm: speaking in full sentences  ED Course/Procedures     Procedures  MDM  Patient signed out to me by B. Marley, PA-C.  Please see previous notes for further history.  In brief, patient presenting for evaluation of fever.  Patient had a fever up to 102 yesterday.  She has been afebrile today.  History of metastatic breast cancer for which she had chemotherapy 3 days ago.  Patient's oncologist is Dr. force with [facility] oncology.  Concern for febrile neutropenia.  Per oncology, also concern for possible pneumonia versus flu versus COVID-19 vs other URI.  Labs, imaging, and flu test pending.  X-ray viewed interpreted by me, no pneumonia, pneumothorax, effusion,, no megaly.  Lactic negative.  White count concerning low at 1.4.  Neutrophil count of 1.1.  Mild anemia at 10.  Considering neutropenia, will consult with [facility] oncology team.  Discussed with Dr. Roosevelt Locks from [facility] oncology, who recommends observation for 12 to 24 hours.  During observation, should recheck blood work and assess for fever.  If fever returns, patient should be admitted.  Additionally, recommends [place] rule out prior to transfer to [place] (if needed).  Will call for admission.  Discussed with Dr[facility] La[facility]ts [facility], he will evaluate the patient.  Discussed with Dr. Laren Everts, who evaluated the pt and did not feel she should be admitted. Per Dr. Laren Everts, pt was agreeable with d/c and close monitoring for fever.  As patient is being tested for COVID-19, she was given strict according to guidelines and a visitor log.   [name] was evaluated in Emergency Department on 08/20/2018 for the symptoms described in the history of present illness. She was evaluated in the context of the global COVID-19 pandemic,  which necessitated consideration that the patient might be at risk for infection with the SARS-CoV-2 virus that causes COVID-19. Institutional protocols and algorithms that pertain to the evaluation of patients at risk for COVID-19 are in a state of rapid change based on information released by regulatory bodies including the CDC and federal and state organizations. These policies and algorithms were followed during the patient's care in the ED.        Franchot Heidelberg, PA-C 08/20/18 Ladene Artist, MD 08/20/18 2144

## 2018-08-20 NOTE — Telephone Encounter (Signed)
Pt called in asking if she should come in for an appt? Pt would like a phone call at [phone]

## 2018-08-20 NOTE — Discharge Instructions (Addendum)
Check your temperature regularly to make sure there is no fever.  If you have any return of fever, return to the emergency room. Return with any new, worsening, or concerning symptoms.      Person Under Monitoring Name: [name]  Location: [place] [address]   CORONAVIRUS DISEASE 2019 (COVID-19) Guidance for Persons Under Investigation You are being tested for the virus that causes coronavirus disease 2019 (COVID-19). Public health actions are necessary to ensure protection of your health and the health of others, and to prevent further spread of infection. COVID-19 is caused by a virus that can cause symptoms, such as fever, cough, and shortness of breath. The primary transmission from person to person is by coughing or sneezing. On June 24, 2018, the [place] announced a [employer] Emergency of International Concern and on June 25, 2018 the [place] Department of Health and Human Services declared a public health emergency. If the virus that causesCOVID-19 spreads in the community, it could have severe public health consequences.  As a person under investigation for COVID-19, the [place] advises you to adhere to the following guidance until your test results are reported to you. If your test result is positive, you will receive additional information from your provider and your local health department at that time.  Remain at home until you are cleared by your health provider or public health authorities.  Keep a log of visitors to your home using the form provided. Any visitors to your home must be aware of your isolation status. If you plan to move to a new address or leave the county, notify the local health department in your county. Call a doctor or seek care if you have an urgent medical need. Before seeking medical care, call ahead and get instructions from the  provider before arriving at the medical office, clinic or hospital. Notify them that you are being tested for the virus that causes COVID-19 so arrangements can be made, as necessary, to prevent transmission to others in the healthcare setting. Next, notify the local health department in your county. If a medical emergency arises and you need to call 911, inform the first responders that you are being tested for the virus that causes COVID-19. Next, notify the local health department in your county. Adhere to all guidance set forth by the [place] for [facility] of patients that is based on guidance from the Center for Disease Control and Prevention with suspected or confirmed COVID-19. It is provided with this guidance for Persons Under Investigation.  Your health and the health of our community are our top priorities. Public Health officials remain available to provide assistance and counseling to you about COVID-19 and compliance with this guidance.  Provider: ____________________________________________________________ Date: ______/_____/_________  By signing below, you acknowledge that you have read and agree to comply with this Guidance for Persons Under Investigation. ______________________________________________________________ Date: ______/_____/_________  WHO DO I CALL? You can find a list of local health departments here: [email or web address] Health Department: ____________________________________________________________________ Contact Name: ________________________________________________________________________ Telephone: ___________________________________________________________________________  Marice Potter, [place], Communicable Disease Branch COVID-19 Guidance for Persons Under Investigation July 31, 2018         Person Under Monitoring Name: [name]  Location: [place] [address]   Infection Prevention Recommendations for Individuals Confirmed to have, or Being Evaluated for, 2019 Novel  Coronavirus (COVID-19) Infection Who Receive Care at Home  Individuals who are confirmed to have, or are being evaluated for, COVID-19 should follow the prevention steps below until a healthcare provider or local or state health department says they can return to normal activities.  Stay home except to get medical care You should restrict activities outside your home, except for getting medical care. Do not go to work, school, or public areas, and do not use public transportation or taxis.  Call ahead before visiting your doctor Before your medical appointment, call the healthcare provider and tell them that you have, or are being evaluated for, COVID-19 infection. This will help the healthcare providers office take steps to keep other people from getting infected. Ask your healthcare provider to call the local or state health department.  Monitor your symptoms Seek prompt medical attention if your illness is worsening (e.g., difficulty breathing). Before going to your medical appointment, call the healthcare provider and tell them that you have, or are being evaluated for, COVID-19 infection. Ask your healthcare provider to call the local or state health department.  Wear a facemask You should wear a facemask that covers your nose and mouth when you are in the same room with other people and when you visit a healthcare provider. People who live with or visit you should also wear a facemask while they are in the same room with you.  Separate yourself from other people in your home As much as possible, you should stay in a different room from other people in your home. Also, you should use a separate bathroom, if available.  Avoid sharing household items You should not share dishes, drinking glasses, cups, eating  utensils, towels, bedding, or other items with other people in your home. After using these items, you should wash them thoroughly with soap and water.  Cover your coughs and sneezes Cover your mouth and nose with a tissue when you cough or sneeze, or you can cough or sneeze into your sleeve. Throw used tissues in a lined trash can, and immediately wash your hands with soap and water for at least 20 seconds or use an alcohol-based hand rub.  Wash your [facility] your hands often and thoroughly with soap and water for at least 20 seconds. You can use an alcohol-based hand sanitizer if soap and water are not available and if your hands are not visibly dirty. Avoid touching your eyes, nose, and mouth with unwashed hands.   Prevention Steps for Caregivers and Household Members of Individuals Confirmed to have, or Being Evaluated for, COVID-19 Infection Being Cared for in the Home  If you live with, or provide care at home for, a person confirmed to have, or being evaluated for, COVID-19 infection please follow these guidelines to prevent infection:  Follow healthcare providers instructions Make sure that you understand and can help the patient follow any healthcare provider instructions for all care.  Provide for the patients basic needs You should help the patient with basic needs in the home and provide support for getting groceries, prescriptions, and other personal needs.  Monitor the patients symptoms If they are getting sicker, call his or her medical provider and tell them that the patient has, or is being evaluated for, COVID-19 infection. This will help the healthcare providers office take steps to keep other people from getting infected. Ask the healthcare provider to call the local or state health department.  Limit the number of people who have contact with the patient  If possible, have only one caregiver for the patient. Other household members should stay in another  home or place of residence. If this is not possible, they should stay in another room, or be separated from the patient as much as possible. Use a separate bathroom, if available. Restrict visitors who do not have an essential need to be in the home.  Keep older adults, very young children, and other sick people away from the patient Keep older adults, very young children, and those who have compromised immune systems or chronic health conditions away from the patient. This includes people with chronic heart, lung, or kidney conditions, diabetes, and cancer.  Ensure good ventilation Make sure that shared spaces in the home have good air flow, such as from an air conditioner or an opened window, weather permitting.  Wash your hands often Wash your hands often and thoroughly with soap and water for at least 20 seconds. You can use an alcohol based hand sanitizer if soap and water are not available and if your hands are not visibly dirty. Avoid touching your eyes, nose, and mouth with unwashed hands. Use disposable paper towels to dry your hands. If not available, use dedicated cloth towels and replace them when they become wet.  Wear a facemask and gloves Wear a disposable facemask at all times in the room and gloves when you touch or have contact with the patients blood, body fluids, and/or secretions or excretions, such as sweat, saliva, sputum, nasal mucus, vomit, urine, or feces.  Ensure the mask fits over your nose and mouth tightly, and do not touch it during use. Throw out disposable facemasks and gloves after using them. Do not reuse. Wash your hands immediately after removing your facemask and gloves. If your personal clothing becomes contaminated, carefully remove clothing and launder. Wash your hands after handling contaminated clothing. Place all used disposable facemasks, gloves, and other waste in a lined container before disposing them with other household waste. Remove gloves and  wash your hands immediately after handling these items.  Do not share dishes, glasses, or other household items with the patient Avoid sharing household items. You should not share dishes, drinking glasses, cups, eating utensils, towels, bedding, or other items with a patient who is confirmed to have, or being evaluated for, COVID-19 infection. After the person uses these items, you should wash them thoroughly with soap and water.  Wash laundry thoroughly Immediately remove and wash clothes or bedding that have blood, body fluids, and/or secretions or excretions, such as sweat, saliva, sputum, nasal mucus, vomit, urine, or feces, on them. Wear gloves when handling laundry from the patient. Read and follow directions on labels of laundry or clothing items and detergent. In general, wash and dry with the warmest temperatures recommended on the label.  Clean all areas the individual has used often Clean all touchable surfaces, such as counters, tabletops, doorknobs, bathroom fixtures, toilets, phones, keyboards, tablets, and bedside tables, every day. Also, clean any surfaces that may have blood, body fluids, and/or secretions or excretions on them. Wear gloves when cleaning surfaces the patient has come in contact with. Use a diluted bleach solution (e.g., dilute bleach with 1 part bleach and 10 parts water) or a household disinfectant with a label that says EPA-registered for coronaviruses. To make a bleach solution at home, add 1 tablespoon of bleach to 1 quart (4 cups) of water. For a larger supply, add  cup of bleach to 1 gallon (16 cups) of water. Read labels  of cleaning products and follow recommendations provided on product labels. Labels contain instructions for safe and effective use of the cleaning product including precautions you should take when applying the product, such as wearing gloves or eye protection and making sure you have good ventilation during use of the product. Remove gloves  and wash hands immediately after cleaning.  Monitor yourself for signs and symptoms of illness Caregivers and household members are considered close contacts, should monitor their health, and will be asked to limit movement outside of the home to the extent possible. Follow the monitoring steps for close contacts listed on the symptom monitoring form.   ? If you have additional questions, contact your local health department or call the epidemiologist on call at [phone] (available 24/7). ? This guidance is subject to change. For the most up-to-date guidance from [facility], please refer to their website: YouBlogs.pl      Person Under Monitoring Name: ADRIELLE POLAKOWSKI  Location: [place] [address]   Record here the list of visitors to your home since you became ill with respiratory symptoms that led you to consult a health provider:  Visitor Name Date Time In Time Out Did this person come within 6 feet of you? Indicate Y or N Relationship to Person Under Monitoring Phone number Comments   ___/____/____ __:__ AM/PM __:__ AM/PM       ___/____/____ __:__ AM/PM __:__ AM/PM       ___/____/____ __:__ AM/PM __:__ AM/PM       ___/____/____ __:__ AM/PM __:__ AM/PM       ___/____/____ __:__ AM/PM __:__ AM/PM       ___/____/____ __:__ AM/PM __:__ AM/PM       ___/____/____ __:__ AM/PM __:__ AM/PM       ___/____/____ __:__ AM/PM __:__ AM/PM       ___/____/____ __:__ AM/PM __:__ AM/PM       ___/____/____ __:__ AM/PM __:__ AM/PM       ___/____/____ __:__ AM/PM __:__ AM/PM       ___/____/____ __:__ AM/PM __:__ AM/PM       ___/____/____ __:__ AM/PM __:__ AM/PM       ___/____/____ __:__ AM/PM __:__ AM/PM       Marice Potter, [place], Communicable Disease Branch

## 2018-08-20 NOTE — Progress Notes (Signed)
Spoke with Viviann Spare, NP at [facility].  Per Dawn, Dr. Derrill Center (oncologist) recommends patient have COVID-19 testing, x-ray, U/A, and CBC before initiating treatment. Explained that testing and CXR are not available outpatient currently d/t symptoms/fever. Dawn stated patient will need to go to the ER for testing/treatment. Dawn plans to call patient and discuss plan.

## 2018-08-20 NOTE — ED Triage Notes (Signed)
Pt was recently diagnosed with pna,and started new chemo Tuesday. Pt had fever of 102 yesterday. Denies SOB. Has required home o2 at bedtime since PNA

## 2018-08-20 NOTE — ED Triage Notes (Signed)
Pt is receiving chemo. Treated for pnuemonia 2 weeks ago. After chemo Tuesday she became more short of breath with fever of 102 at highest, and is now requiring O2 @ 1L. Pt does not normally wear O2.

## 2018-08-20 NOTE — Progress Notes (Signed)
I have discussed the procedure for the virtual visit with the patient who has given consent to proceed with assessment and treatment.   Lataya Varnell, CMA     

## 2018-08-20 NOTE — Consult Note (Signed)
[facility]                                                                                    Patient Demographics  [name], is a 63 y.o. female  CSN: [id number]  MRN: [id number]  DOB - 1956-01-06  Admit Date - 08/20/2018  Outpatient Primary MD for the patient is Midge Minium, MD   With History of -  Past Medical History:  Diagnosis Date  . Arthritis   . 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 Heart murmur   . 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 MVP (mitral valve prolapse)    mild mvp w/ mild regurg. per last echo 04-24-2017  . Neuropathy due to chemotherapeutic drug ([place])   . Non-healing surgical wound    right knee post re-implantation total knee arthroplasty 04-2017 unable to completely close surgical incision  . PONV (postoperative nausea and vomiting)    ponv likes scopolamine patch  . Port-A-Cath in place    RIGHT CHEST   . Red blood cell antibody positive   . Skin changes related to chemotherapy    per patient she has scabbed over skin circumventing port to right upper chest ;  state it is due to chemptherapy   . 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 APPLICATION OF A-CELL OF BACK Right 07/31/2016   Procedure: CELLERATE COLLAGEN PLACEMENT;  Surgeon: Loel Lofty Dillingham, DO;  Location: [place];  Service: Plastics;  Laterality: Right;  . APPLICATION OF WOUND VAC Right 06/29/2017   Procedure: APPLICATION OF WOUND VAC;  Surgeon: Wallace Going, DO;  Location: [facility];  Service: Plastics;  Laterality: Right;  . DEBRIDEMENT CHEST WALL RIGHT/ VAC PLACEMENT  02-09-2015    [facility]  . EXCISIONAL TOTAL KNEE ARTHROPLASTY WITH ANTIBIOTIC SPACERS Right 12/02/2016   Procedure: EXCISIONAL TOTAL KNEE ARTHROPLASTY WITH ANTIBIOTIC SPACERS;  Surgeon: Paralee Cancel, MD;  Location: [facility];  Service: Orthopedics;  Laterality: Right;  90 mins  . HEMATOMA EVACUATION Right 06/29/2017   Procedure: EVACUATION HEMATOMA OF RIGHT KNEE;  Surgeon: Wallace Going, DO;  Location: [facility];  Service: Plastics;  Laterality: Right;  . HEMATOMA EVACUATION Right 06/29/2017  Procedure: EVACUATION RIGHT TOTAL KNEE HEMATOMA;  Surgeon: Paralee Cancel, MD;  [facility];  Service: Orthopedics;  Laterality: Right;  . HEMATOMA EVACUATION Right 07/14/2017   Procedure: EVACUATION RIGHT LEG HEMATOMA WITH APPLICATION OF WOUND VAC;  Surgeon: Paralee Cancel, MD;  [facility];  Service: Orthopedics;  Laterality: Right;  . hemotoma     evacuation left chest wall  . I&D EXTREMITY Right 07/17/2017   Procedure: EVACUATION RIGHT LEG HEMATOMA WITH WOUND VAC DRESSING CHANGE;  Surgeon: Paralee Cancel, MD;  [facility];  Service: Orthopedics;  Laterality: Right;  . I&D KNEE WITH POLY EXCHANGE Right 05/28/2016   Procedure: IRRIGATION AND DEBRIDEMENT KNEE WOUND VAC PLACMENT;  Surgeon: Susa Day, MD;  [facility];  Service: Orthopedics;  Laterality: Right;  . I&D KNEE WITH POLY EXCHANGE Right 05/30/2016   Procedure: RADICAL SYNOVECTOMY,IRRIGATION AND DEBRIDEMENT KNEE WITH  POLY EXCHANGE WITH ANTIBIOTIC BEADS, APPLICATION OF WOUND VAC;  Surgeon: Susa Day, MD;  [facility];  Service: Orthopedics;  Laterality: Right;  . INCISION AND DRAINAGE OF WOUND Right 07/31/2016   Procedure: IRRIGATION AND DEBRIDEMENT RIGHT KNEE  WOUND;  Surgeon: Loel Lofty Dillingham, DO;  Location: [place];  Service: Plastics;  Laterality: Right;  . IR FLUORO GUIDE CV LINE LEFT  04/30/2017  . IR US GUIDE VASC ACCESS LEFT  04/30/2017  . IRRIGATION AND DEBRIDEMENT KNEE Right 04/12/2016   Procedure: IRRIGATION AND DEBRIDEMENT KNEE;  Surgeon: Nicholes Stairs, MD;  [facility];  Service: Orthopedics;  Laterality: Right;  . IRRIGATION AND DEBRIDEMENT KNEE Right 12/16/2016   Procedure: Repeat irrigation and debridement right knee, wound closure  wound vac REPLACEMENT ANTIBIOTIC SPACERS;  Surgeon: Paralee Cancel, MD;  [facility];  Service: Orthopedics;  Laterality: Right;  . KNEE ARTHROSCOPY  02/13/2012   Procedure: ARTHROSCOPY KNEE;  Surgeon: Johnn Hai, MD;  Location: [facility];  Service: Orthopedics;  Laterality: Left;  WITH DEBRIDEMENt   . KNEE ARTHROSCOPY WITH LATERAL MENISECTOMY  02/13/2012   Procedure: KNEE ARTHROSCOPY WITH LATERAL MENISECTOMY;  Surgeon: Johnn Hai, MD;  Location: [facility];  Service: Orthopedics;;  partial  . LATISSIMUS FLAP RIGHT CHEST  02-23-2015   [facility]  . LEFT MODIFIED RADICAL MASTECTOMY/ RIGHT TOTAL MASTECTOMY  11-13-2007   LEFT BREAST CANCER W/ AXILLARY LYMPH NODE METASTASIS AND POST NEOADJUVANT CHEMO  . MASS EXCISION Right 06/29/2017   Procedure: EXCISION OF METASTATIC BREAST CANCER TO SKIN RIGHT SHOULDER, PLACEMENT OF CELLERATE;  Surgeon: Wallace Going, DO;  [facility];  Service: Plastics;  Laterality: Right;  . PLACEMENT PORT-A-CATH  06/22/2007    new port placed 2015, right chest  . REIMPLANTATION OF TOTAL KNEE Right 04/27/2017   Procedure: Reimplantation of right total knee arthroplasty;  Surgeon:  Paralee Cancel, MD;  [facility];  Service: Orthopedics;  Laterality: Right;  Adductor Block  . RESECTION OF ISOLATED CHEST WALL DISEASE/ ABDOMINAL SKIN FLAP  02/ 2015     [facility]   RIGHT CHEST WALL METASTATIC SKIN CARCINOMA  . RIGHT HEART CATH N/A 01/27/2018   Procedure: RIGHT HEART CATH;  Surgeon: Jolaine Artist, MD;  Location: [place] [facility];  Service: Cardiovascular;  Laterality: N/A;  . SKIN SPLIT GRAFT Right 01/29/2017   Procedure: SKIN GRAFT SPLIT THICKNESS TO RIGHT KNEE WOUND;  Surgeon: Wallace Going, DO;  [facility];  Service: Plastics;  Laterality: Right;  . SKIN SPLIT GRAFT Right 09/07/2017   Procedure: SKIN GRAFT SPLIT THICKNESS TO RIGHT  KNEE WOUND WITH PLACEMENT OF VAC DRESSING TO GRAFT SITE;  Surgeon: Wallace Going, DO;  Location: [place];  Service: Plastics;  Laterality: Right;  . TOTAL KNEE ARTHROPLASTY Left 09/23/2012   Procedure: LEFT TOTAL KNEE ARTHROPLASTY;  Surgeon: Johnn Hai, MD;  [facility];  Service: Orthopedics;  Laterality: Left;  . TOTAL KNEE ARTHROPLASTY Right 03/20/2016   Procedure: RIGHT TOTAL KNEE ARTHROPLASTY;  Surgeon: Susa Day, MD;  [facility];  Service: Orthopedics;  Laterality: Right;  . TRANSTHORACIC ECHOCARDIOGRAM  04/24/2017   ef 03-50%, grade 1 diastolic dysfuction/  mild MR with mild prolapse anterior leaflet/ mild TR/ PASP 70mmHg    in for   Chief Complaint  Patient presents with  . Fever     HPI  [name]  is a 63 y.o. female, with past medical history significant for metastatic breast cancer on immunotherapy, being treated at [facility] with an experimental medication presenting today because of fever that occurred yesterday.  The emergency room the patient was found to be leukopenic but no fever was noted .  ER provider discussed case with [facility] who advised admission for observation, no IV medications to be given.  No history of cough or shortness of breath.  The  patient had pneumonia 10 days ago and was admitted to our facility and was treated and was placed on oxygen at home.  Patient was told that her immunotherapy can give her rigors at home. Discussed with patient at length who has a husband and a child living with her and I offered either admission for observation which I am not convinced about or discharge and monitor temperature at home since patient is afebrile and she can come back if fever develops and she wanted to go home.  I think by admitting this patient to the hospital running her risk of hospital induced infection due to her low white blood cell count and immunocompromise.  Patient was saturating well on room air when I saw her.  Discussed with the ER NP as well and recommendations were given    Review of Systems    In addition to the HPI above,   No Headache, No changes with Vision or hearing, No problems swallowing food or Liquids, No Chest pain, Cough or Shortness of Breath, No Abdominal pain, No Nausea or Vommitting, Bowel movements are regular, No Blood in stool or Urine, No dysuria, No new skin rashes or bruises, No new joints pains-aches,  No new weakness, tingling, numbness in any extremity, No recent weight gain or loss, No polyuria, polydypsia or polyphagia, No significant Mental Stressors.  A full 10 point Review of Systems was done, except as stated above, all other Review of Systems were negative.   Social History Social History   Tobacco Use  . Smoking status: Never Smoker  . Smokeless tobacco: Never Used  Substance Use Topics  . Alcohol use: No     Family History Family History  Problem Relation Age of Onset  . Heart disease Mother        due to mitral valve regurgiation,   . Cancer Mother        breast  . Allergic rhinitis Mother   . Parkinsonism Father   . Allergic rhinitis Father   . Migraines Father   . Alcohol abuse Paternal Grandfather   . Angioedema Neg Hx   . Asthma Neg Hx   . Eczema  Neg Hx      Prior to  Admission medications   Medication Sig Start Date End Date Taking? Authorizing Provider  acetaminophen (TYLENOL) 500 MG tablet Take 500 mg by mouth every 8 (eight) hours as needed for mild pain or headache.   Yes [provider]  apixaban (ELIQUIS) 5 MG TABS tablet Take 1 tablet (5 mg total) by mouth 2 (two) times daily. 04/02/18  Yes Midge Minium, MD  celecoxib (CELEBREX) 200 MG capsule Take 1 capsule (200 mg total) by mouth 2 (two) times daily as needed for moderate pain. Patient taking differently: Take 200 mg by mouth daily.  06/11/18  Yes Paz, Alda Berthold, MD  cetirizine (ZYRTEC) 10 MG tablet Take 1 tablet (10 mg total) daily by mouth. 04/03/17  Yes Midge Minium, MD  Cholecalciferol (VITAMIN D3) 2000 UNITS TABS Take 2,000 Units by mouth 4 (four) times a week.    Yes [provider]  doxycycline (MONODOX) 100 MG capsule Take 100 mg by mouth 2 (two) times daily. 08/11/18  Yes [provider]  gabapentin (NEURONTIN) 300 MG capsule Take 1 capsule (300 mg total) by mouth at bedtime. Patient taking differently: Take 300 mg by mouth 3 (three) times daily.  08/06/18  Yes Vasireddy, Grier Mitts, MD  mirabegron ER (MYRBETRIQ) 25 MG TB24 tablet Take 1 tablet (25 mg total) by mouth daily. 02/08/18  Yes Midge Minium, MD  Multiple Vitamin (MULTI-VITAMIN PO) Take 1 tablet by mouth daily.   Yes [provider]  ondansetron (ZOFRAN) 8 MG tablet TAKE 1 TABLET BY MOUTH EVERY 8 HOURS AS NEEDED FOR NAUSEA. Patient taking differently: Take 8 mg by mouth every 8 (eight) hours as needed for nausea or vomiting.  11/06/17  Yes Midge Minium, MD  oxyCODONE (OXY IR/ROXICODONE) 5 MG immediate release tablet Take 1 tablet by mouth every 4 (four) hours as needed for moderate pain.  08/13/18  Yes [provider]  prochlorperazine (COMPAZINE) 10 MG tablet Take 10 mg by mouth every 6 (six) hours as needed for nausea or vomiting.  09/12/17  Yes  [provider]  sildenafil (REVATIO) 20 MG tablet Take 2 tablets (40 mg total) by mouth 3 (three) times daily. 05/27/18  Yes Bensimhon, Shaune Pascal, MD  zolpidem (AMBIEN) 5 MG tablet TAKE 1 TABLET BY MOUTH ONCE DAILY AT BEDTIME AS NEEDED FOR SLEEP Patient not taking: Reported on 08/20/2018 09/17/17   Brunetta Jeans, PA-C    Allergies  Allergen Reactions  . Penicillins Hives, Itching, Rash and Other (See Comments)    Patient has previously tolerated cefazolin, ceftriaxone, and cefepime  PATIENT HAS HAD A PCN REACTION WITH IMMEDIATE RASH, FACIAL/TONGUE/THROAT SWELLING, SOB, OR LIGHTHEADEDNESS WITH HYPOTENSION:  #  #  YES  #  #  Has patient had a PCN reaction causing severe rash involving mucus membranes or skin necrosis: No Has patient had a PCN reaction that required hospitalization No Has patient had a PCN reaction occurring within the last 10 years: yes Denies airway involvement   . Tape Hives and Other (See Comments)    Can tolerate paper and adhesive,  NO MEDIPORE TAPE  . Other Rash and Other (See Comments)    STERI STRIPS - Blisters    Physical Exam  Vitals  Blood pressure 118/70, pulse (!) 101, temperature 98.2 F (36.8 C), resp. rate 19, height 5\' 7"  (1.702 m), weight 86.6 kg, SpO2 96 %.   1. General chronically ill, no acute distress  2. Normal affect and insight, Not Suicidal or Homicidal, Awake Alert, Oriented X  3.  3. No F.N deficits, grossly, patient moving all extremities.  4. Ears and Eyes appear Normal, Conjunctivae clear, PERRLA. Moist Oral Mucosa.  5. Supple Neck, No JVD, No cervical lymphadenopathy appriciated, No Carotid Bruits.  6. Symmetrical Chest wall movement, Good air movement bilaterally, CTAB.  Scars of breast surgeries noted  7. RRR, No Gallops, Rubs or Murmurs, No Parasternal Heave.  8. Positive Bowel Sounds, Abdomen Soft, Non tender, No organomegaly appriciated,No rebound -guarding or rigidity.  9.  No Cyanosis, Normal Skin Turgor, No  Skin Rash or Bruise.  10. Good muscle tone,  joints appear normal , no effusions, Normal ROM.    Data Review  CBC Recent Labs  Lab 08/20/18 1448  WBC 1.4*  HGB 10.1*  HCT 32.6*  PLT 58*  MCV 95.9  MCH 29.7  MCHC 31.0  RDW 17.2*  LYMPHSABS 0.2*  MONOABS 0.1  EOSABS 0.1  BASOSABS 0.0   ------------------------------------------------------------------------------------------------------------------  Chemistries  Recent Labs  Lab 08/20/18 1448  NA 137  K 4.0  CL 103  CO2 25  GLUCOSE 113*  BUN 18  CREATININE 0.78  CALCIUM 8.5*  AST 39  ALT 32  ALKPHOS 122  BILITOT 0.8   ------------------------------------------------------------------------------------------------------------------ estimated creatinine clearance is 82.4 mL/min (by C-G formula based on SCr of 0.78 mg/dL). ------------------------------------------------------------------------------------------------------------------ No results for input(s): TSH, T4TOTAL, T3FREE, THYROIDAB in the last 72 hours.  Invalid input(s): FREET3   Coagulation profile No results for input(s): INR, PROTIME in the last 168 hours. ------------------------------------------------------------------------------------------------------------------- No results for input(s): DDIMER in the last 72 hours. -------------------------------------------------------------------------------------------------------------------  Cardiac Enzymes No results for input(s): CKMB, TROPONINI, MYOGLOBIN in the last 168 hours.  Invalid input(s): CK ------------------------------------------------------------------------------------------------------------------ Invalid input(s): POCBNP   ---------------------------------------------------------------------------------------------------------------  Urinalysis    Component Value Date/Time   COLORURINE YELLOW 08/20/2018 1416   APPEARANCEUR CLEAR 08/20/2018 1416   LABSPEC 1.023 08/20/2018  1416   LABSPEC 1.005 02/17/2008 1122   PHURINE 5.0 08/20/2018 1416   GLUCOSEU NEGATIVE 08/20/2018 1416   HGBUR NEGATIVE 08/20/2018 1416   HGBUR trace-intact 11/22/2010 0835   BILIRUBINUR NEGATIVE 08/20/2018 1416   BILIRUBINUR negative 02/08/2018 1021   BILIRUBINUR Negative 02/17/2008 1122   KETONESUR NEGATIVE 08/20/2018 1416   PROTEINUR NEGATIVE 08/20/2018 1416   UROBILINOGEN 0.2 02/08/2018 1021   UROBILINOGEN 1.0 02/01/2015 1654   NITRITE NEGATIVE 08/20/2018 1416   LEUKOCYTESUR NEGATIVE 08/20/2018 1416   LEUKOCYTESUR Moderate 02/17/2008 1122    ----------------------------------------------------------------------------------------------------------------   Imaging results:   Dg Chest 2 View  Result Date: 08/02/2018 CLINICAL DATA:  Shortness of breath.  Productive cough and fever. EXAM: CHEST - 2 VIEW COMPARISON:  Chest x-ray Oct 16, 2017 and CT scan May 07, 2017 FINDINGS: Superior retraction of the prominent left hilum is stable. The heart, hila, mediastinum, and pleura are unchanged. The right Port-A-Cath is stable terminating in the central SVC. No pulmonary nodules or masses. No focal infiltrates. No overt edema. IMPRESSION: No interval change. Electronically Signed   By: Dorise Bullion III M.D   On: 08/02/2018 18:30   Dg Chest Portable 1 View  Result Date: 08/20/2018 CLINICAL DATA:  Pneumonia EXAM: PORTABLE CHEST 1 VIEW COMPARISON:  Chest CT 05/07/2017 and CXR 08/02/2018 FINDINGS: Stable cardiomegaly with aortic atherosclerosis and retracted appearance of the left hilum counting for the curvilinear density noted projecting over the left upper lung. No acute pulmonary consolidation, effusion or edema. Numerous surgical clips project over the axilla and chest wall. Port catheter tip terminates in proximal right atrium. No aggressive osseous lesions. IMPRESSION:  Stable appearance of the chest with cardiomegaly, aortic atherosclerosis and retracted appearance of the left hilum.  Based on prior CT, the curvilinear appearance of the upper retracted left hilum is believed secondary to pulmonary vasculature. No acute pulmonary consolidation to suggest pneumonia. Electronically Signed   By: Ashley Royalty M.D.   On: 08/20/2018 14:46      Assessment & Plan  As discussed in HPI patient was advised to go home and monitor her temperature and come back if temperature recurs.  Otherwise she can follow-up with PCP. @SIGNATURE @

## 2018-08-21 ENCOUNTER — Encounter: Payer: Self-pay | Admitting: Family Medicine

## 2018-08-21 LAB — URINE CULTURE

## 2018-08-24 ENCOUNTER — Encounter: Payer: Self-pay | Admitting: Family Medicine

## 2018-08-24 LAB — NOVEL CORONAVIRUS, NAA (HOSP ORDER, SEND-OUT TO REF LAB; TAT 18-24 HRS): SARS-CoV-2, NAA: NOT DETECTED

## 2018-08-25 ENCOUNTER — Other Ambulatory Visit: Payer: Self-pay | Admitting: *Deleted

## 2018-08-25 DIAGNOSIS — R0902 Hypoxemia: Secondary | ICD-10-CM

## 2018-08-25 LAB — CULTURE, BLOOD (ROUTINE X 2)
Culture: NO GROWTH
Culture: NO GROWTH

## 2018-08-25 NOTE — Progress Notes (Signed)
Order for [employer].    Order will be faxed to [facility] @ [phone]

## 2018-08-27 DIAGNOSIS — C792 Secondary malignant neoplasm of skin: Secondary | ICD-10-CM | POA: Diagnosis not present

## 2018-08-27 DIAGNOSIS — C50912 Malignant neoplasm of unspecified site of left female breast: Secondary | ICD-10-CM | POA: Diagnosis not present

## 2018-08-27 DIAGNOSIS — Z5111 Encounter for antineoplastic chemotherapy: Secondary | ICD-10-CM | POA: Diagnosis not present

## 2018-08-27 DIAGNOSIS — Z006 Encounter for examination for normal comparison and control in clinical research program: Secondary | ICD-10-CM | POA: Diagnosis not present

## 2018-08-30 MED FILL — MYRBETRIQ ER 50 MG TABLET: 50 | 30 days supply | Qty: 30 | Fill #0

## 2018-08-30 MED FILL — ELIQUIS 5 MG TABLET: 5 | 30 days supply | Qty: 60 | Fill #0

## 2018-08-30 MED FILL — GABAPENTIN 300 MG CAPSULE: 300 | 30 days supply | Qty: 90 | Fill #0

## 2018-08-30 MED FILL — SILDENAFIL CITRATE 20 MG TA: 20 | 30 days supply | Qty: 180 | Fill #0

## 2018-09-03 DIAGNOSIS — C792 Secondary malignant neoplasm of skin: Secondary | ICD-10-CM | POA: Diagnosis not present

## 2018-09-03 DIAGNOSIS — Z006 Encounter for examination for normal comparison and control in clinical research program: Secondary | ICD-10-CM | POA: Diagnosis not present

## 2018-09-03 DIAGNOSIS — Z5112 Encounter for antineoplastic immunotherapy: Secondary | ICD-10-CM | POA: Diagnosis not present

## 2018-09-03 DIAGNOSIS — C50912 Malignant neoplasm of unspecified site of left female breast: Secondary | ICD-10-CM | POA: Diagnosis not present

## 2018-09-06 ENCOUNTER — Telehealth (HOSPITAL_COMMUNITY): Payer: Self-pay | Admitting: Vascular Surgery

## 2018-09-06 NOTE — Telephone Encounter (Signed)
Left pt message to make 6 month brst f/u w/ DB W/ echo in late June or July, asked pt to call back to make appt

## 2018-09-14 ENCOUNTER — Telehealth (HOSPITAL_COMMUNITY): Payer: Self-pay | Admitting: Vascular Surgery

## 2018-09-14 DIAGNOSIS — Z5112 Encounter for antineoplastic immunotherapy: Secondary | ICD-10-CM | POA: Diagnosis not present

## 2018-09-14 DIAGNOSIS — Z171 Estrogen receptor negative status [ER-]: Secondary | ICD-10-CM | POA: Diagnosis not present

## 2018-09-14 DIAGNOSIS — I272 Pulmonary hypertension, unspecified: Secondary | ICD-10-CM | POA: Diagnosis not present

## 2018-09-14 DIAGNOSIS — Z5111 Encounter for antineoplastic chemotherapy: Secondary | ICD-10-CM | POA: Diagnosis not present

## 2018-09-14 DIAGNOSIS — G893 Neoplasm related pain (acute) (chronic): Secondary | ICD-10-CM | POA: Diagnosis not present

## 2018-09-14 DIAGNOSIS — C50912 Malignant neoplasm of unspecified site of left female breast: Secondary | ICD-10-CM | POA: Diagnosis not present

## 2018-09-14 DIAGNOSIS — C792 Secondary malignant neoplasm of skin: Secondary | ICD-10-CM | POA: Diagnosis not present

## 2018-09-14 DIAGNOSIS — C50919 Malignant neoplasm of unspecified site of unspecified female breast: Secondary | ICD-10-CM

## 2018-09-14 DIAGNOSIS — G629 Polyneuropathy, unspecified: Secondary | ICD-10-CM | POA: Diagnosis not present

## 2018-09-14 DIAGNOSIS — C50412 Malignant neoplasm of upper-outer quadrant of left female breast: Secondary | ICD-10-CM | POA: Diagnosis not present

## 2018-09-14 DIAGNOSIS — Z006 Encounter for examination for normal comparison and control in clinical research program: Secondary | ICD-10-CM | POA: Diagnosis not present

## 2018-09-14 NOTE — Telephone Encounter (Signed)
Pt called to make appt w/ db, pt is a brst cancer pt, she is on recall for June, pt wants to be seen in office ASAP, due to fast heart rate per her oncologist, she needs to see DB to adjust medication and get echo, can you please call pt . Please advise

## 2018-09-14 NOTE — Telephone Encounter (Signed)
Left message to call back  

## 2018-09-15 NOTE — Telephone Encounter (Signed)
Spoke w/pt, she states at times her HR increased to about 110, usually with exertion.  She feels she needs to go ahead and get echo done.  Discussed ? Need for zio monitor at home however pt doesn't feel that she could wear it as she is allergic to adhesives and she has a rash on her chest from cancer.  Pt due for echo and f/u w/DB in June.  Advised will go ahead and get echo sch and a televisit with Dr Haroldine Laws.

## 2018-09-15 NOTE — Telephone Encounter (Signed)
Pt left VM on triage with same concern Attempted to return call  No answer [facility]

## 2018-09-20 MED FILL — DOXYCYCLINE MONO 100 MG CAP: 100 | 30 days supply | Qty: 60 | Fill #0

## 2018-09-20 MED FILL — ONDANSETRON HCL 8 MG TABLET: 8 | 7 days supply | Qty: 20 | Fill #0

## 2018-09-21 DIAGNOSIS — C792 Secondary malignant neoplasm of skin: Secondary | ICD-10-CM | POA: Diagnosis not present

## 2018-09-21 DIAGNOSIS — Z006 Encounter for examination for normal comparison and control in clinical research program: Secondary | ICD-10-CM | POA: Diagnosis not present

## 2018-09-21 DIAGNOSIS — L988 Other specified disorders of the skin and subcutaneous tissue: Secondary | ICD-10-CM | POA: Diagnosis not present

## 2018-09-21 DIAGNOSIS — C50912 Malignant neoplasm of unspecified site of left female breast: Secondary | ICD-10-CM | POA: Diagnosis not present

## 2018-09-21 DIAGNOSIS — M546 Pain in thoracic spine: Secondary | ICD-10-CM | POA: Diagnosis not present

## 2018-09-21 DIAGNOSIS — Z5111 Encounter for antineoplastic chemotherapy: Secondary | ICD-10-CM | POA: Diagnosis not present

## 2018-09-21 DIAGNOSIS — R000 Tachycardia, unspecified: Secondary | ICD-10-CM | POA: Diagnosis not present

## 2018-09-21 DIAGNOSIS — R21 Rash and other nonspecific skin eruption: Secondary | ICD-10-CM | POA: Diagnosis not present

## 2018-09-21 DIAGNOSIS — G893 Neoplasm related pain (acute) (chronic): Secondary | ICD-10-CM | POA: Diagnosis not present

## 2018-09-21 DIAGNOSIS — R0609 Other forms of dyspnea: Secondary | ICD-10-CM | POA: Diagnosis not present

## 2018-09-21 DIAGNOSIS — L989 Disorder of the skin and subcutaneous tissue, unspecified: Secondary | ICD-10-CM | POA: Diagnosis not present

## 2018-09-21 DIAGNOSIS — C50412 Malignant neoplasm of upper-outer quadrant of left female breast: Secondary | ICD-10-CM | POA: Diagnosis not present

## 2018-09-22 ENCOUNTER — Ambulatory Visit (HOSPITAL_COMMUNITY)
Admission: RE | Admit: 2018-09-22 | Discharge: 2018-09-22 | Disposition: A | Discharge status: Home / Self Care | Payer: 59 | Source: Ambulatory Visit | Attending: Cardiology | Admitting: Cardiology

## 2018-09-22 ENCOUNTER — Other Ambulatory Visit: Payer: Self-pay

## 2018-09-22 DIAGNOSIS — I272 Pulmonary hypertension, unspecified: Secondary | ICD-10-CM | POA: Diagnosis not present

## 2018-09-22 DIAGNOSIS — C50919 Malignant neoplasm of unspecified site of unspecified female breast: Secondary | ICD-10-CM | POA: Diagnosis not present

## 2018-09-22 DIAGNOSIS — I083 Combined rheumatic disorders of mitral, aortic and tricuspid valves: Secondary | ICD-10-CM | POA: Diagnosis not present

## 2018-09-22 NOTE — Progress Notes (Signed)
  Echocardiogram 2D Echocardiogram has been performed.  Johny Chess 09/22/2018, 10:54 AM

## 2018-09-24 DIAGNOSIS — C50412 Malignant neoplasm of upper-outer quadrant of left female breast: Secondary | ICD-10-CM | POA: Diagnosis not present

## 2018-09-24 DIAGNOSIS — C50912 Malignant neoplasm of unspecified site of left female breast: Secondary | ICD-10-CM | POA: Diagnosis not present

## 2018-09-24 DIAGNOSIS — G893 Neoplasm related pain (acute) (chronic): Secondary | ICD-10-CM | POA: Diagnosis not present

## 2018-09-24 DIAGNOSIS — I272 Pulmonary hypertension, unspecified: Secondary | ICD-10-CM | POA: Diagnosis not present

## 2018-09-24 DIAGNOSIS — C792 Secondary malignant neoplasm of skin: Secondary | ICD-10-CM | POA: Diagnosis not present

## 2018-09-24 DIAGNOSIS — Z5112 Encounter for antineoplastic immunotherapy: Secondary | ICD-10-CM | POA: Diagnosis not present

## 2018-09-24 DIAGNOSIS — R748 Abnormal levels of other serum enzymes: Secondary | ICD-10-CM | POA: Diagnosis not present

## 2018-09-24 DIAGNOSIS — K819 Cholecystitis, unspecified: Secondary | ICD-10-CM | POA: Diagnosis not present

## 2018-09-24 DIAGNOSIS — Z171 Estrogen receptor negative status [ER-]: Secondary | ICD-10-CM | POA: Diagnosis not present

## 2018-09-24 DIAGNOSIS — Z5111 Encounter for antineoplastic chemotherapy: Secondary | ICD-10-CM | POA: Diagnosis not present

## 2018-09-27 ENCOUNTER — Other Ambulatory Visit: Payer: Self-pay

## 2018-09-27 ENCOUNTER — Ambulatory Visit (HOSPITAL_COMMUNITY)
Admission: RE | Admit: 2018-09-27 | Discharge: 2018-09-27 | Disposition: A | Discharge status: Home / Self Care | Payer: 59 | Source: Ambulatory Visit | Attending: Internal Medicine | Admitting: Internal Medicine

## 2018-09-27 DIAGNOSIS — C50919 Malignant neoplasm of unspecified site of unspecified female breast: Secondary | ICD-10-CM

## 2018-09-27 DIAGNOSIS — I272 Pulmonary hypertension, unspecified: Secondary | ICD-10-CM

## 2018-09-27 NOTE — Addendum Note (Signed)
Encounter addended by: Jovita Kussmaul, RN on: 09/27/2018 11:59 AM  Actions taken: Order list changed, Diagnosis association updated

## 2018-09-27 NOTE — Progress Notes (Signed)
Heart Failure TeleHealth Note  Due to national recommendations of social distancing due to [place] 19, Audio/video telehealth visit is felt to be most appropriate for this patient at this time.  See MyChart message from today for patient consent regarding telehealth for [facility].  Date:  09/27/2018   ID:  [name], DOB [birth year], MRN [id number]  Location: Home  Provider location: [facility] Type of Visit: Established patient  PCP:  Midge Minium, MD  Cardiologist:  No primary care provider on file. 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   She is s/p bilateral mastectomies, XRT, chemo. She is currently on Kadcyla (trastuzamab-emantsine) every 3 weeks. Underwent skin flap for skin metastasis on her abdomen in 2/15. Had skin resection with chest skin flap on 02/21/15 at [facility]. Requiring wound vac. Path ok fortunately with no recurrent malignancy. Had clots around port-a-cath so on Elqiuis.   Had R TKA in 2018 which got infected and then had to have hardware removed and got a spacer. Hardware eventually replaced in 2019. Then developed a hematoma and wound. Chemo had to be stopped for 2 months and required a skin graft. Unfortunately when chemo stopped developed recurrent skin mets which she is now struggling with. Remains on lapatanib (taking every other day due to diarrhea) and herceptin. Had CT chest in 12/18 due to SOB. No PE but showed evidence of possible PAH. VQ ordered 5/19. No PE.  Was having left shoulder pain but bone scan negative. 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 Started on sildenafil.   Recently failed a research drug with progression of  disease. Now getting famtratuzumab = deruxtecam which can cause pulmonary fibrosis.   She  presents via Engineer, civil (consulting) for a telehealth visit today. She is tolerating Sildenafil 40 tid. Riding a stationary bike 3 x weeks for about 25 minutes also doing some walking and arm exercises. Denies SOB with ADLs or mild/mod exertion. Only DOE with steep inclines. SBP running 100-120. HR running 90s.  Echo 4/20 60-65% GLS -15.0% (underestiamted) Mild to moderate TR RVSP 63 Personally reviewed  Echo 12/19 shows EF 65-70%  RV ok. Mild to moderate TR. GLS -19.1% RVSP 52    RHC 01/27/18 RA = 5 RV = 68/8 PA = 67/28 (45) PCW = 6 Fick cardiac output/index = 6.8/3.6 Thermo CO/CI = 7.4/4.0 PVR = 5.7 WU Ao sat = 98% PA sat = 79%, 79% High SVC sat = 79%     ELAYAH [name] denies symptoms worrisome for COVID 19.   Past Medical History:  Diagnosis Date  . Arthritis   . Breast cancer metastasized to skin, right [facility]) followed by dr force -- oncolgoist w/ [place]   primary left breast cancer dx 01/ 2009 ; 11/ 2009 recurrence left chest wall and neck, tx clinical trial drug and chemotherapy;  2015 recurrence cutaneous metastatized to right skin/ chest wall, 02/ 2015 resection chest wall disease and 02-23-2015 s/p skin flap surgery,  chemo every 3 weeks  . 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 Heart murmur   . 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 MVP (mitral valve prolapse)    mild mvp w/ mild regurg. per last echo 04-24-2017  . Neuropathy due to chemotherapeutic drug ([place])   . Non-healing surgical wound    right knee post  re-implantation total knee arthroplasty 04-2017 unable to completely close surgical incision  . PONV (postoperative nausea and vomiting)    ponv likes scopolamine patch  . Port-A-Cath in place    RIGHT CHEST   . Red blood cell antibody positive   . Skin changes related to chemotherapy    per patient she has scabbed over skin circumventing port to right upper chest ;  state it is due to chemptherapy   . 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 APPLICATION OF A-CELL OF BACK Right 07/31/2016   Procedure: CELLERATE COLLAGEN PLACEMENT;  Surgeon: Loel Lofty Dillingham, DO;  Location: [place];  Service: Plastics;  Laterality: Right;  . APPLICATION OF WOUND VAC Right 06/29/2017   Procedure: APPLICATION OF WOUND VAC;  Surgeon: Wallace Going, DO;  Location: [facility];  Service: Plastics;  Laterality: Right;  . DEBRIDEMENT CHEST WALL RIGHT/ VAC PLACEMENT  02-09-2015    [facility]  . EXCISIONAL TOTAL KNEE ARTHROPLASTY WITH ANTIBIOTIC SPACERS Right 12/02/2016   Procedure: EXCISIONAL TOTAL KNEE ARTHROPLASTY WITH ANTIBIOTIC SPACERS;  Surgeon: Paralee Cancel, MD;  [facility];  Service: Orthopedics;  Laterality: Right;  90 mins  . HEMATOMA EVACUATION Right 06/29/2017   Procedure: EVACUATION HEMATOMA OF RIGHT KNEE;  Surgeon: Wallace Going, DO;  [facility];  Service: Plastics;  Laterality: Right;  . HEMATOMA EVACUATION Right 06/29/2017   Procedure: EVACUATION RIGHT TOTAL KNEE HEMATOMA;  Surgeon: Paralee Cancel, MD;  [facility];  Service: Orthopedics;  Laterality: Right;  . HEMATOMA EVACUATION Right 07/14/2017   Procedure: EVACUATION RIGHT LEG HEMATOMA WITH APPLICATION OF WOUND VAC;  Surgeon: Paralee Cancel, MD;  [facility];  Service: Orthopedics;  Laterality: Right;  . hemotoma     evacuation left chest wall  . I&D EXTREMITY Right  07/17/2017   Procedure: EVACUATION RIGHT LEG HEMATOMA WITH WOUND VAC DRESSING CHANGE;  Surgeon: Paralee Cancel, MD;  [facility];  Service: Orthopedics;  Laterality: Right;  . I&D KNEE WITH POLY EXCHANGE Right 05/28/2016   Procedure: IRRIGATION AND DEBRIDEMENT KNEE WOUND VAC PLACMENT;  Surgeon: Susa Day, MD;  [facility];  Service: Orthopedics;  Laterality: Right;  . I&D KNEE WITH POLY EXCHANGE Right 05/30/2016   Procedure: RADICAL SYNOVECTOMY,IRRIGATION AND DEBRIDEMENT KNEE WITH POLY EXCHANGE WITH ANTIBIOTIC BEADS, APPLICATION OF WOUND VAC;  Surgeon: Susa Day, MD;  [facility];  Service: Orthopedics;  Laterality: Right;  . INCISION AND DRAINAGE OF WOUND Right 07/31/2016   Procedure: IRRIGATION AND DEBRIDEMENT RIGHT KNEE  WOUND;  Surgeon: Loel Lofty Dillingham, DO;  Location: [place];  Service: Plastics;  Laterality: Right;  . IR FLUORO GUIDE CV LINE LEFT  04/30/2017  . IR US GUIDE VASC ACCESS LEFT  04/30/2017  . IRRIGATION AND DEBRIDEMENT KNEE Right 04/12/2016   Procedure: IRRIGATION  AND DEBRIDEMENT KNEE;  Surgeon: Nicholes Stairs, MD;  [facility];  Service: Orthopedics;  Laterality: Right;  . IRRIGATION AND DEBRIDEMENT KNEE Right 12/16/2016   Procedure: Repeat irrigation and debridement right knee, wound closure  wound vac REPLACEMENT ANTIBIOTIC SPACERS;  Surgeon: Paralee Cancel, MD;  [facility];  Service: Orthopedics;  Laterality: Right;  . KNEE ARTHROSCOPY  02/13/2012   Procedure: ARTHROSCOPY KNEE;  Surgeon: Johnn Hai, MD;  Location[facility];  Service: Orthopedics;  Laterality: Left;  WITH DEBRIDEMENt   . KNEE ARTHROSCOPY WITH LATERAL MENISECTOMY  02/13/2012   Procedure: KNEE ARTHROSCOPY WITH LATERAL MENISECTOMY;  Surgeon: Johnn Hai, MD;  Location[facility];  Service: Orthopedics;;  partial  . LATISSIMUS FLAP RIGHT CHEST  02-23-2015   [facility]  . LEFT MODIFIED RADICAL MASTECTOMY/ RIGHT TOTAL MASTECTOMY  11-13-2007   LEFT  BREAST CANCER W/ AXILLARY LYMPH NODE METASTASIS AND POST NEOADJUVANT CHEMO  . MASS EXCISION Right 06/29/2017   Procedure: EXCISION OF METASTATIC BREAST CANCER TO SKIN RIGHT SHOULDER, PLACEMENT OF CELLERATE;  Surgeon: Wallace Going, DO;  [facility];  Service: Plastics;  Laterality: Right;  . PLACEMENT PORT-A-CATH  06/22/2007    new port placed 2015, right chest  . REIMPLANTATION OF TOTAL KNEE Right 04/27/2017   Procedure: Reimplantation of right total knee arthroplasty;  Surgeon: Paralee Cancel, MD;  [facility];  Service: Orthopedics;  Laterality: Right;  Adductor Block  . RESECTION OF ISOLATED CHEST WALL DISEASE/ ABDOMINAL SKIN FLAP  02/ 2015     [facility]   RIGHT CHEST WALL METASTATIC SKIN CARCINOMA  . RIGHT HEART CATH N/A 01/27/2018   Procedure: RIGHT HEART CATH;  Surgeon: Jolaine Artist, MD;  Location: [place] [facility];  Service: Cardiovascular;  Laterality: N/A;  . SKIN SPLIT GRAFT Right 01/29/2017   Procedure: SKIN GRAFT SPLIT THICKNESS TO RIGHT KNEE WOUND;  Surgeon: Wallace Going, DO;  [facility];  Service: Plastics;  Laterality: Right;  . SKIN SPLIT GRAFT Right 09/07/2017   Procedure: SKIN GRAFT SPLIT THICKNESS TO RIGHT KNEE WOUND WITH PLACEMENT OF VAC DRESSING TO GRAFT SITE;  Surgeon: Wallace Going, DO;  Location: [place];  Service: Plastics;  Laterality: Right;  . TOTAL KNEE ARTHROPLASTY Left 09/23/2012   Procedure: LEFT TOTAL KNEE ARTHROPLASTY;  Surgeon: Johnn Hai, MD;  [facility];  Service: Orthopedics;  Laterality: Left;  . TOTAL KNEE ARTHROPLASTY Right 03/20/2016   Procedure: RIGHT TOTAL KNEE ARTHROPLASTY;  Surgeon: Susa Day, MD;  [facility];  Service: Orthopedics;  Laterality: Right;  . TRANSTHORACIC ECHOCARDIOGRAM  04/24/2017   ef 42-35%, grade 1 diastolic dysfuction/  mild MR with mild prolapse anterior leaflet/ mild TR/ PASP 59mHg     Current Outpatient Medications  Medication Sig Dispense Refill  .  acetaminophen (TYLENOL) 500 MG tablet Take 500 mg by mouth every 8 (eight) hours as needed for mild pain or headache.    .Marland Kitchenapixaban (ELIQUIS) 5 MG TABS tablet Take 1 tablet (5 mg total) by mouth 2 (two) times daily. 60 tablet 6  . celecoxib (CELEBREX) 200 MG capsule Take 1 capsule (200 mg total) by mouth 2 (two) times daily as needed for moderate pain. (Patient taking differently: Take 200 mg by mouth daily. ) 180 capsule 1  . cetirizine (ZYRTEC) 10 MG tablet Take 1 tablet (10 mg total) daily by mouth. 30 tablet 11  . Cholecalciferol (VITAMIN D3) 2000 UNITS TABS Take 2,000 Units by  mouth 4 (four) times a week.     . doxycycline (MONODOX) 100 MG capsule Take 100 mg by mouth 2 (two) times daily.    [name] gabapentin (NEURONTIN) 300 MG capsule Take 1 capsule (300 mg total) by mouth at bedtime. (Patient taking differently: Take 300 mg by mouth 3 (three) times daily. )    . mirabegron ER (MYRBETRIQ) 25 MG TB24 tablet Take 1 tablet (25 mg total) by mouth daily. 30 tablet 3  . Multiple Vitamin (MULTI-VITAMIN PO) Take 1 tablet by mouth daily.    . ondansetron (ZOFRAN) 8 MG tablet TAKE 1 TABLET BY MOUTH EVERY 8 HOURS AS NEEDED FOR NAUSEA. (Patient taking differently: Take 8 mg by mouth every 8 (eight) hours as needed for nausea or vomiting. ) 20 tablet 6  . oxyCODONE (OXY IR/ROXICODONE) 5 MG immediate release tablet Take 1 tablet by mouth every 4 (four) hours as needed for moderate pain.     [name] prochlorperazine (COMPAZINE) 10 MG tablet Take 10 mg by mouth every 6 (six) hours as needed for nausea or vomiting.   3  . sildenafil (REVATIO) 20 MG tablet Take 2 tablets (40 mg total) by mouth 3 (three) times daily. 180 tablet 6  . zolpidem (AMBIEN) 5 MG tablet TAKE 1 TABLET BY MOUTH ONCE DAILY AT BEDTIME AS NEEDED FOR SLEEP (Patient not taking: Reported on 08/20/2018) 10 tablet 0   No current facility-administered medications for this encounter.     Allergies:   Penicillins; Tape; and Other   Social History:  The  patient  reports that she has never smoked. She has never used smokeless tobacco. She reports that she does not drink alcohol or use drugs.   Family History:  The patient's family history includes Alcohol abuse in her paternal grandfather; Allergic rhinitis in her father and mother; Cancer in her mother; Heart disease in her mother; Migraines in her father; Parkinsonism in her father.   ROS:  Please see the history of present illness.   All other systems are personally reviewed and negative.   Exam:  (Video/Tele Health Call; Exam is subjective and or/visual.) General:  Speaks in full sentences. No resp difficulty. Lungs: Normal respiratory effort with conversation.  Abdomen: Non-distended per patient report Extremities: Pt denies edema. Neuro: Alert & oriented x 3.   Recent Labs: 02/25/2018: TSH 1.50 08/02/2018: Magnesium 1.6 08/20/2018: ALT 32; BUN 18; Creatinine, Ser 0.78; Hemoglobin 10.1; Platelets 58; Potassium 4.0; Sodium 137  Personally reviewed   Wt Readings from Last 3 Encounters:  08/20/18 86.6 kg (191 lb)  08/20/18 85.7 kg (189 lb)  08/12/18 86.4 kg (190 lb 8 oz)      ASSESSMENT AND PLAN:  1. Breast CA, stage IV with skin mets - HER 2-neu+ , ER/PR -, BRCA - - Recent progression cancer - [facility]  - I reviewed echos personally. EF and Doppler parameters stable. No HF on exam. Continue chemo  Repeat echo 4 months.   2. 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 Continue sildenafil 40 TID. Will not increase with low BP - Will follow closely can add 2nd agent as needed. Suspect she may have pulmonary arteriopathy due  to chemo/XRT regimen. - Repeat echo in 4 months    COVID screen The patient does not have any symptoms that suggest any further testing/ screening at this time.  Social distancing reinforced today.  Recommended follow-up:  As above  Relevant cardiac medications were reviewed at length with the patient today.   The patient does not have concerns regarding their medications at this time.   The following changes were made today:  As above  Today, I have spent 14 minutes with the patient with telehealth technology discussing the above issues .    Signed, Glori Bickers, MD  09/27/2018 11:41 AM  Advanced [facility] Failure [place] [address] [phone] (office) [phone] (fax)

## 2018-10-02 MED FILL — ELIQUIS 5 MG TABLET: 5 | 30 days supply | Qty: 60 | Fill #0

## 2018-10-02 MED FILL — SILDENAFIL CITRATE 20 MG TA: 20 | 30 days supply | Qty: 180 | Fill #1

## 2018-10-02 MED FILL — GABAPENTIN 300 MG CAPSULE: 300 | 30 days supply | Qty: 90 | Fill #1

## 2018-10-15 DIAGNOSIS — Z5111 Encounter for antineoplastic chemotherapy: Secondary | ICD-10-CM | POA: Diagnosis not present

## 2018-10-15 DIAGNOSIS — R7881 Bacteremia: Secondary | ICD-10-CM | POA: Diagnosis not present

## 2018-10-15 DIAGNOSIS — I340 Nonrheumatic mitral (valve) insufficiency: Secondary | ICD-10-CM | POA: Diagnosis not present

## 2018-10-15 DIAGNOSIS — C50911 Malignant neoplasm of unspecified site of right female breast: Secondary | ICD-10-CM | POA: Diagnosis not present

## 2018-10-15 DIAGNOSIS — Z5112 Encounter for antineoplastic immunotherapy: Secondary | ICD-10-CM | POA: Diagnosis not present

## 2018-10-15 DIAGNOSIS — Z171 Estrogen receptor negative status [ER-]: Secondary | ICD-10-CM | POA: Diagnosis not present

## 2018-10-15 DIAGNOSIS — R112 Nausea with vomiting, unspecified: Secondary | ICD-10-CM | POA: Diagnosis not present

## 2018-10-15 DIAGNOSIS — C50412 Malignant neoplasm of upper-outer quadrant of left female breast: Secondary | ICD-10-CM | POA: Diagnosis not present

## 2018-10-15 DIAGNOSIS — Z006 Encounter for examination for normal comparison and control in clinical research program: Secondary | ICD-10-CM | POA: Diagnosis not present

## 2018-10-16 MED FILL — DOXYCYCLINE MONO 100 MG CAP: 100 | 30 days supply | Qty: 60 | Fill #1

## 2018-10-16 MED FILL — MYRBETRIQ ER 50 MG TABLET: 50 | 30 days supply | Qty: 30 | Fill #1

## 2018-11-04 DIAGNOSIS — Z5111 Encounter for antineoplastic chemotherapy: Secondary | ICD-10-CM | POA: Diagnosis not present

## 2018-11-04 DIAGNOSIS — J479 Bronchiectasis, uncomplicated: Secondary | ICD-10-CM | POA: Diagnosis not present

## 2018-11-04 DIAGNOSIS — R918 Other nonspecific abnormal finding of lung field: Secondary | ICD-10-CM | POA: Diagnosis not present

## 2018-11-04 DIAGNOSIS — C50412 Malignant neoplasm of upper-outer quadrant of left female breast: Secondary | ICD-10-CM | POA: Diagnosis not present

## 2018-11-04 DIAGNOSIS — C50911 Malignant neoplasm of unspecified site of right female breast: Secondary | ICD-10-CM | POA: Diagnosis not present

## 2018-11-04 DIAGNOSIS — Z5112 Encounter for antineoplastic immunotherapy: Secondary | ICD-10-CM | POA: Diagnosis not present

## 2018-11-05 DIAGNOSIS — Z5112 Encounter for antineoplastic immunotherapy: Secondary | ICD-10-CM | POA: Diagnosis not present

## 2018-11-05 DIAGNOSIS — Z5111 Encounter for antineoplastic chemotherapy: Secondary | ICD-10-CM | POA: Diagnosis not present

## 2018-11-05 DIAGNOSIS — C50911 Malignant neoplasm of unspecified site of right female breast: Secondary | ICD-10-CM | POA: Diagnosis not present

## 2018-11-05 DIAGNOSIS — Z171 Estrogen receptor negative status [ER-]: Secondary | ICD-10-CM | POA: Diagnosis not present

## 2018-11-05 DIAGNOSIS — I272 Pulmonary hypertension, unspecified: Secondary | ICD-10-CM | POA: Diagnosis not present

## 2018-11-05 DIAGNOSIS — R748 Abnormal levels of other serum enzymes: Secondary | ICD-10-CM | POA: Diagnosis not present

## 2018-11-05 DIAGNOSIS — M7989 Other specified soft tissue disorders: Secondary | ICD-10-CM | POA: Diagnosis not present

## 2018-11-05 DIAGNOSIS — C50412 Malignant neoplasm of upper-outer quadrant of left female breast: Secondary | ICD-10-CM | POA: Diagnosis not present

## 2018-11-05 DIAGNOSIS — I340 Nonrheumatic mitral (valve) insufficiency: Secondary | ICD-10-CM | POA: Diagnosis not present

## 2018-11-08 ENCOUNTER — Other Ambulatory Visit: Payer: Self-pay | Admitting: Family Medicine

## 2018-11-08 MED FILL — CELECOXIB 200 MG CAP: 200 | 90 days supply | Qty: 180 | Fill #1

## 2018-11-08 MED FILL — ONDANSETRON HCL 8 MG TABLET: 8 | 7 days supply | Qty: 20 | Fill #0

## 2018-11-08 MED FILL — GABAPENTIN 300 MG CAPSULE: 300 | 30 days supply | Qty: 90 | Fill #2

## 2018-11-08 MED FILL — SILDENAFIL CITRATE 20 MG TA: 20 | 30 days supply | Qty: 180 | Fill #2

## 2018-11-08 MED FILL — ELIQUIS 5 MG TABLET: 5 | 30 days supply | Qty: 60 | Fill #1

## 2018-11-19 MED FILL — DOXYCYCLINE MONO 100 MG CAP: 100 | 30 days supply | Qty: 60 | Fill #2

## 2018-11-19 MED FILL — MYRBETRIQ ER 50 MG TABLET: 50 | 30 days supply | Qty: 30 | Fill #2

## 2018-12-11 ENCOUNTER — Encounter (HOSPITAL_COMMUNITY): Payer: Self-pay

## 2018-12-13 ENCOUNTER — Other Ambulatory Visit (HOSPITAL_COMMUNITY): Payer: Self-pay | Admitting: *Deleted

## 2018-12-17 ENCOUNTER — Telehealth (HOSPITAL_COMMUNITY): Payer: Self-pay | Admitting: Pharmacy Technician

## 2018-12-17 NOTE — Telephone Encounter (Signed)
Advanced Heart Failure Patient Advocate Encounter  Prior Authorization for Sildenafil has been approved.    PA# [id number] Effective dates: 11/24/2018 through 12/14/2019  Patients Co-Pay: $25.83 for 30 day supply  Attempted to call patient and make her aware of approval, had to leave message with woman who picked up.  Confirmed pt did fill and pick up medication at [facility] [id number] on 12/15/2018  Charlann Boxer, CPhT

## 2018-12-20 ENCOUNTER — Encounter (HOSPITAL_COMMUNITY): Payer: Self-pay

## 2018-12-21 ENCOUNTER — Telehealth (HOSPITAL_COMMUNITY): Payer: Self-pay | Admitting: *Deleted

## 2018-12-21 NOTE — Telephone Encounter (Signed)
Pt left VM requesting a sooner echo and f/u with Dr.Bensimhon I couldn't hear the rest of her VM it was cut off. She is scheduled for follow up and echo in  September.  I called pt to get more information about why she needed a sooner appt pt did not answer/left VM.

## 2018-12-23 NOTE — Telephone Encounter (Signed)
Ok to work in.

## 2018-12-23 NOTE — Telephone Encounter (Signed)
Patient was started on lasix 40 daily by oncologist for BLE Was advised to move her upcoming appt up, (echo and follow up 9/10)  Weight- stable (patient did not have numbers in front of her during the time of the call) After two doses of lasix weight only decreased 1 lb LE edema resolved  Denied SOB   Will refer to provider if appt should move up, as this would change appt status to a WORK IN or if patient is stable enough to wait until upcoming appt.

## 2018-12-29 NOTE — Telephone Encounter (Signed)
Add on appt 8/13 @ 320 Pt aware

## 2019-01-06 ENCOUNTER — Encounter (HOSPITAL_COMMUNITY): Payer: Self-pay | Admitting: Internal Medicine

## 2019-01-06 ENCOUNTER — Ambulatory Visit (HOSPITAL_COMMUNITY)
Admission: RE | Admit: 2019-01-06 | Discharge: 2019-01-06 | Disposition: A | Discharge status: Home / Self Care | Payer: Medicare Other | Source: Ambulatory Visit | Attending: Internal Medicine | Admitting: Internal Medicine

## 2019-01-06 ENCOUNTER — Other Ambulatory Visit: Payer: Self-pay

## 2019-01-06 VITALS — BP 129/80 | HR 102 | Wt 198.4 lb

## 2019-01-06 DIAGNOSIS — I341 Nonrheumatic mitral (valve) prolapse: Secondary | ICD-10-CM | POA: Insufficient documentation

## 2019-01-06 DIAGNOSIS — K766 Portal hypertension: Secondary | ICD-10-CM

## 2019-01-06 DIAGNOSIS — Z171 Estrogen receptor negative status [ER-]: Secondary | ICD-10-CM | POA: Insufficient documentation

## 2019-01-06 DIAGNOSIS — Z9013 Acquired absence of bilateral breasts and nipples: Secondary | ICD-10-CM | POA: Insufficient documentation

## 2019-01-06 DIAGNOSIS — Z7901 Long term (current) use of anticoagulants: Secondary | ICD-10-CM | POA: Insufficient documentation

## 2019-01-06 DIAGNOSIS — R600 Localized edema: Secondary | ICD-10-CM | POA: Diagnosis present

## 2019-01-06 DIAGNOSIS — C792 Secondary malignant neoplasm of skin: Secondary | ICD-10-CM | POA: Diagnosis not present

## 2019-01-06 DIAGNOSIS — C50919 Malignant neoplasm of unspecified site of unspecified female breast: Secondary | ICD-10-CM | POA: Diagnosis not present

## 2019-01-06 DIAGNOSIS — R197 Diarrhea, unspecified: Secondary | ICD-10-CM | POA: Diagnosis not present

## 2019-01-06 DIAGNOSIS — M1990 Unspecified osteoarthritis, unspecified site: Secondary | ICD-10-CM | POA: Insufficient documentation

## 2019-01-06 DIAGNOSIS — I2721 Secondary pulmonary arterial hypertension: Secondary | ICD-10-CM

## 2019-01-06 DIAGNOSIS — Z79899 Other long term (current) drug therapy: Secondary | ICD-10-CM | POA: Insufficient documentation

## 2019-01-06 DIAGNOSIS — R42 Dizziness and giddiness: Secondary | ICD-10-CM | POA: Insufficient documentation

## 2019-01-06 DIAGNOSIS — Z853 Personal history of malignant neoplasm of breast: Secondary | ICD-10-CM | POA: Diagnosis not present

## 2019-01-06 NOTE — Progress Notes (Signed)
Heart Failure Clinic Note    Date:  01/06/2019   ID:  [name], DOB 1955-08-12, MRN [id number]  Location: Home  Provider location: [facility] Type of Visit: Established patient  PCP:  Midge Minium, MD  Cardiologist:  No primary care provider on file. 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   She is s/p bilateral mastectomies, XRT, chemo. She is currently on Kadcyla (trastuzamab-emantsine) every 3 weeks. Underwent skin flap for skin metastasis on her abdomen in 2/15. Had skin resection with chest skin flap on 02/21/15 at [facility]. Requiring wound vac. Path ok fortunately with no recurrent malignancy. Had clots around port-a-cath so on Elqiuis.   Had R TKA in 2018 which got infected and then had to have hardware removed and got a spacer. Hardware eventually replaced in 2019. Then developed a hematoma and wound. Chemo had to be stopped for 2 months and required a skin graft. Unfortunately when chemo stopped developed recurrent skin mets which she is now struggling with. Remains on lapatanib (taking every other day due to diarrhea) and herceptin. Had CT chest in 12/18 due to SOB. No PE but showed evidence of possible PAH. VQ ordered 5/19. No PE.  Was having left shoulder pain but bone scan negative. 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 Started on sildenafil.   Recently failed a research drug with progression of disease. Now getting famtratuzumab = deruxtecam which can cause pulmonary fibrosis.   She  presents today for work-in visit due to LE edema. Three weeks ago developed progressive LE edema. No SOB, orthopnea or PND. [facility] started lasix '40mg'$ /day with  improvement. Remains on sildenafil 40 TID. Mild dizziness. No orthopnea or PND    Echo 4/20 60-65% GLS -15.0% (underestimated) Mild to moderate TR RV ok  RVSP 63 Personally reviewed  Echo 12/19 shows EF 65-70%  RV ok. Mild to moderate TR. GLS -19.1% RVSP 52    RHC 01/27/18 RA = 5 RV = 68/8 PA = 67/28 (45) PCW = 6 Fick cardiac output/index = 6.8/3.6 Thermo CO/CI = 7.4/4.0 PVR = 5.7 WU Ao sat = 98% PA sat = 79%, 79% High SVC sat = 79%     [name] denies symptoms worrisome for COVID 19.   Past Medical History:  Diagnosis Date  . Arthritis   . Breast cancer metastasized to skin, right [facility] followed by dr force -- oncolgoist w/ [place]   primary left breast cancer dx 01/ 2009 ; 11/ 2009 recurrence left chest wall and neck, tx clinical trial drug and chemotherapy;  2015 recurrence cutaneous metastatized to right skin/ chest wall, 02/ 2015 resection chest wall disease and 02-23-2015 s/p skin flap surgery,  chemo every 3 weeks  . 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 Heart murmur   . 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 MVP (mitral valve prolapse)    mild mvp w/ mild regurg. per last echo 04-24-2017  . Neuropathy due to chemotherapeutic drug ([place])   . Non-healing surgical wound    right knee post re-implantation total knee arthroplasty 04-2017 unable to completely close surgical incision  . PONV (postoperative nausea and vomiting)    ponv likes scopolamine patch  . Port-A-Cath in place    RIGHT CHEST   . Red blood cell antibody positive   . Skin changes related to chemotherapy    per patient she has scabbed over  skin circumventing port to right upper chest ;  state it is due to chemptherapy   . 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 APPLICATION OF A-CELL OF BACK Right 07/31/2016   Procedure: CELLERATE COLLAGEN PLACEMENT;  Surgeon: Loel Lofty Dillingham, DO;  Location: [place];  Service: Plastics;  Laterality: Right;  . APPLICATION OF WOUND VAC Right 06/29/2017   Procedure: APPLICATION OF WOUND VAC;  Surgeon: [place] Going, DO;  Location: [facility];  Service: Plastics;  Laterality: Right;  . DEBRIDEMENT CHEST WALL RIGHT/ VAC PLACEMENT  02-09-2015    [facility]  . EXCISIONAL TOTAL KNEE ARTHROPLASTY WITH ANTIBIOTIC SPACERS Right 12/02/2016   Procedure: EXCISIONAL TOTAL KNEE ARTHROPLASTY WITH ANTIBIOTIC SPACERS;  Surgeon: Paralee Cancel, MD;  [facility];  Service: Orthopedics;  Laterality: Right;  90 mins  . HEMATOMA EVACUATION Right 06/29/2017   Procedure: EVACUATION HEMATOMA OF RIGHT KNEE;  Surgeon: [place] Going, DO;  [facility];  Service: Plastics;  Laterality: Right;  . HEMATOMA EVACUATION Right 06/29/2017   Procedure: EVACUATION RIGHT TOTAL KNEE HEMATOMA;  Surgeon: Paralee Cancel, MD;  [facility];  Service: Orthopedics;  Laterality: Right;  . HEMATOMA EVACUATION Right 07/14/2017   Procedure: EVACUATION RIGHT LEG HEMATOMA WITH APPLICATION OF WOUND VAC;  Surgeon: Paralee Cancel, MD;  [facility];  Service: Orthopedics;  Laterality: Right;  . hemotoma     evacuation left chest wall  . I&D EXTREMITY Right 07/17/2017   Procedure: EVACUATION RIGHT LEG HEMATOMA WITH WOUND VAC DRESSING CHANGE;  Surgeon: Paralee Cancel, MD;  [facility];  Service: Orthopedics;  Laterality: Right;  . I&D KNEE WITH POLY EXCHANGE Right 05/28/2016   Procedure: IRRIGATION AND DEBRIDEMENT KNEE WOUND VAC PLACMENT;  Surgeon: Susa Day, MD;  [facility];  Service: Orthopedics;  Laterality: Right;  . I&D KNEE WITH POLY EXCHANGE Right 05/30/2016   Procedure: RADICAL SYNOVECTOMY,IRRIGATION AND DEBRIDEMENT KNEE WITH POLY EXCHANGE WITH ANTIBIOTIC BEADS, APPLICATION OF WOUND VAC;  Surgeon: Susa Day, MD;  [facility];  Service: Orthopedics;  Laterality: Right;  . INCISION AND DRAINAGE OF WOUND Right 07/31/2016   Procedure: IRRIGATION AND DEBRIDEMENT RIGHT KNEE  WOUND;  Surgeon: Loel Lofty Dillingham, DO;  Location: [facility];  Service: Plastics;  Laterality: Right;  . IR FLUORO GUIDE CV LINE LEFT  04/30/2017  . IR US GUIDE VASC ACCESS LEFT  04/30/2017  . IRRIGATION AND DEBRIDEMENT KNEE Right 04/12/2016   Procedure: IRRIGATION AND DEBRIDEMENT KNEE;  Surgeon: Nicholes Stairs, MD;  [facility];  Service: Orthopedics;  Laterality: Right;  . IRRIGATION AND DEBRIDEMENT KNEE Right 12/16/2016   Procedure: Repeat irrigation and debridement right knee, wound closure  wound vac REPLACEMENT ANTIBIOTIC SPACERS;  Surgeon: Alvan Dame,  Rodman Key, MD;  [facility];  Service: Orthopedics;  Laterality: Right;  . KNEE ARTHROSCOPY  02/13/2012   Procedure: ARTHROSCOPY KNEE;  Surgeon: Johnn Hai, MD;  Location: [facility];  Service: Orthopedics;  Laterality: Left;  WITH DEBRIDEMENt   . KNEE ARTHROSCOPY WITH LATERAL MENISECTOMY  02/13/2012   Procedure: KNEE ARTHROSCOPY WITH LATERAL MENISECTOMY;  Surgeon: Johnn Hai, MD;  Location: [facility];  Service: Orthopedics;;  partial  . LATISSIMUS FLAP RIGHT CHEST  02-23-2015   [facility]  . LEFT MODIFIED RADICAL MASTECTOMY/ RIGHT TOTAL MASTECTOMY  11-13-2007   LEFT BREAST CANCER W/ AXILLARY LYMPH NODE METASTASIS AND POST NEOADJUVANT CHEMO  . MASS EXCISION Right 06/29/2017   Procedure: EXCISION OF METASTATIC BREAST CANCER TO SKIN RIGHT SHOULDER, PLACEMENT OF CELLERATE;  Surgeon: [place] Going, DO;  [facility];  Service: Plastics;  Laterality: Right;  . PLACEMENT PORT-A-CATH   06/22/2007    new port placed 2015, right chest  . REIMPLANTATION OF TOTAL KNEE Right 04/27/2017   Procedure: Reimplantation of right total knee arthroplasty;  Surgeon: Paralee Cancel, MD;  [facility];  Service: Orthopedics;  Laterality: Right;  Adductor Block  . RESECTION OF ISOLATED CHEST WALL DISEASE/ ABDOMINAL SKIN FLAP  02/ 2015     [facility]   RIGHT CHEST WALL METASTATIC SKIN CARCINOMA  . RIGHT HEART CATH N/A 01/27/2018   Procedure: RIGHT HEART CATH;  Surgeon: Jolaine Artist, MD;  Location: [place] [facility];  Service: Cardiovascular;  Laterality: N/A;  . SKIN SPLIT GRAFT Right 01/29/2017   Procedure: SKIN GRAFT SPLIT THICKNESS TO RIGHT KNEE WOUND;  Surgeon: [place] Going, DO;  [facility];  Service: Plastics;  Laterality: Right;  . SKIN SPLIT GRAFT Right 09/07/2017   Procedure: SKIN GRAFT SPLIT THICKNESS TO RIGHT KNEE WOUND WITH PLACEMENT OF VAC DRESSING TO GRAFT SITE;  Surgeon: [place] Going, DO;  Location: [place];  Service: Plastics;  Laterality: Right;  . TOTAL KNEE ARTHROPLASTY Left 09/23/2012   Procedure: LEFT TOTAL KNEE ARTHROPLASTY;  Surgeon: Johnn Hai, MD;  [facility];  Service: Orthopedics;  Laterality: Left;  . TOTAL KNEE ARTHROPLASTY Right 03/20/2016   Procedure: RIGHT TOTAL KNEE ARTHROPLASTY;  Surgeon: [place]a Day, MD;  [facility];  Service: Orthopedics;  Laterality: Right;  . TRANSTHORACIC ECHOCARDIOGRAM  04/24/2017   ef 63-78%, grade 1 diastolic dysfuction/  mild MR with mild prolapse anterior leaflet/ mild TR/ PASP 40mHg     Current Outpatient Medications  Medication Sig Dispense Refill  . acetaminophen (TYLENOL) 500 MG tablet Take 500 mg by mouth every 8 (eight) hours as needed for mild pain or headache.    .Marland Kitchenapixaban (ELIQUIS) 5 MG TABS tablet Take 1 tablet (5 mg total) by mouth 2 (two) times daily. 60 tablet 6  . celecoxib (CELEBREX) 200 MG capsule Take 1 capsule (200 mg total) by mouth 2 (two) times daily as  needed for moderate pain. (Patient taking differently: Take 200 mg by mouth daily. ) 180 capsule 1  . cetirizine (ZYRTEC) 10 MG tablet Take 1 tablet (10 mg total) daily by mouth. 30 tablet 11  . Cholecalciferol (VITAMIN D3) 2000 UNITS TABS Take 2,000 Units by mouth 4 (four) times a week.     . doxycycline (MONODOX) 100 MG capsule Take 100 mg by mouth 2 (two) times daily.    .Marland Kitchengabapentin (NEURONTIN) 300 MG capsule Take 1 capsule (300 mg total) by mouth at bedtime. (Patient taking differently:  Take 300 mg by mouth 3 (three) times daily. )    . mirabegron ER (MYRBETRIQ) 25 MG TB24 tablet Take 1 tablet (25 mg total) by mouth daily. 30 tablet 3  . ondansetron (ZOFRAN) 8 MG tablet TAKE 1 TABLET BY MOUTH EVERY 8 HOURS AS NEEDED FOR NAUSEA. 20 tablet 4  . oxyCODONE (OXY IR/ROXICODONE) 5 MG immediate release tablet Take 1 tablet by mouth every 4 (four) hours as needed for moderate pain.     . sildenafil (REVATIO) 20 MG tablet Take 2 tablets (40 mg total) by mouth 3 (three) times daily. 180 tablet 6   No current facility-administered medications for this encounter.     Allergies:   Penicillins, Tape, and Other   Social History:  The patient  reports that she has never smoked. She has never used smokeless tobacco. She reports that she does not drink alcohol or use drugs.   Family History:  The patient's family history includes Alcohol abuse in her paternal grandfather; Allergic rhinitis in her father and mother; Cancer in her mother; Heart disease in her mother; Migraines in her father; Parkinsonism in her father.   ROS:  Please see the history of present illness.   All other systems are personally reviewed and negative.   Exam:   General:  Well appearing. No resp difficulty HEENT: normal Neck: supple. no JVD. Carotids 2+ bilat; no bruits. No lymphadenopathy or thryomegaly appreciated. Cor: PMI nondisplaced. Regular rate & rhythm.soft TR Prominent P2 Lungs: clear Abdomen: soft, nontender,  nondistended. No hepatosplenomegaly. No bruits or masses. Good bowel sounds. Extremities: no cyanosis, clubbing, rash, 1+ edema Neuro: alert & orientedx3, cranial nerves grossly intact. moves all 4 extremities w/o difficulty. Affect pleasant   Recent Labs: 02/25/2018: TSH 1.50 08/02/2018: Magnesium 1.6 08/20/2018: ALT 32; BUN 18; Creatinine, Ser 0.78; Hemoglobin 10.1; Platelets 58; Potassium 4.0; Sodium 137  Personally reviewed   Wt Readings from Last 3 Encounters:  01/06/19 90 kg (198 lb 6.4 oz)  08/20/18 86.6 kg (191 lb)  08/20/18 85.7 kg (189 lb)    ECG: sinus 99 bpm No ST-T wave abnormalities. Personally reviewed   ASSESSMENT AND PLAN:  1. Breast CA, stage IV with skin mets - HER 2-neu+ , ER/PR -, BRCA - - Continue chemo at [facility]  - Due for 4 month echo. Will order.   2. PAH with right-sided HF - Volume status recently elevated. Due for 4 month repeat echo. Will order. - Volume likely still a bit elevated on exam.  Continue lasix 40 daily and compression hose. Check labs today - Echo 4/20 60-65% GLS -15.0% (underestiamted) Mild to moderate TR RVSP 63 Personally reviewed (stable) - CT chest No PE - VQ 5/19 No PE - [facility] CT chest 12/14/17 No comment on pulmonary artery opacification   - Mod PAH with high output and pulmonary arteriopathy PA 67/28 (45)  - She has had a seemingly good response to sildenafil. Continue sildenafil 40 TID - Will follow closely can add 2nd agent as needed. Suspect she may have pulmonary arteriopathy due to chemo/XRT regimen. Await results of echo.   COVID screen The patient does not have any symptoms that suggest any further testing/ screening at this time.  Social distancing reinforced today.  Recommended follow-up:  As above  Relevant cardiac medications were reviewed at length with the patient today.   The patient does not have concerns regarding their medications at this time.   The following changes were made today:  As above  Today, I  have  spent 14 minutes with the patient with telehealth technology discussing the above issues .    Signed, Glori Bickers, MD  01/06/2019 3:53 PM  [facility] [place] [address] Heart [place] [address] [phone] (office) [phone] (fax)

## 2019-01-06 NOTE — Patient Instructions (Signed)
Your physician has requested that you have an echocardiogram. Echocardiography is a painless test that uses sound waves to create images of your heart. It provides your doctor with information about the size and shape of your heart and how well your heart's chambers and valves are working. This procedure takes approximately one hour. There are no restrictions for this procedure.  Your physician recommends that you schedule a follow-up appointment in: Keep your appointment in September with Dr Haroldine Laws  At the [facility], you and your health needs are our priority. As part of our continuing mission to provide you with exceptional heart care, we have created designated Provider Care Teams. These Care Teams include your primary Cardiologist (physician) and Advanced Practice Providers (APPs- Physician Assistants and Nurse Practitioners) who all work together to provide you with the care you need, when you need it.   You may see any of the following providers on your designated Care Team at your next follow up: [name] Dr Glori Bickers . Dr Loralie Champagne . Darrick Grinder, NP   Please be sure to bring in all your medications bottles to every appointment.

## 2019-01-11 ENCOUNTER — Other Ambulatory Visit: Payer: Self-pay

## 2019-01-11 ENCOUNTER — Ambulatory Visit (HOSPITAL_COMMUNITY)
Admission: RE | Admit: 2019-01-11 | Discharge: 2019-01-11 | Disposition: A | Discharge status: Home / Self Care | Payer: Medicare Other | Source: Ambulatory Visit | Attending: Internal Medicine | Admitting: Internal Medicine

## 2019-01-11 ENCOUNTER — Other Ambulatory Visit (HOSPITAL_COMMUNITY): Payer: Medicare Other

## 2019-01-11 DIAGNOSIS — I341 Nonrheumatic mitral (valve) prolapse: Secondary | ICD-10-CM | POA: Insufficient documentation

## 2019-01-11 DIAGNOSIS — R011 Cardiac murmur, unspecified: Secondary | ICD-10-CM | POA: Diagnosis not present

## 2019-01-11 DIAGNOSIS — I272 Pulmonary hypertension, unspecified: Secondary | ICD-10-CM | POA: Insufficient documentation

## 2019-01-11 DIAGNOSIS — I313 Pericardial effusion (noninflammatory): Secondary | ICD-10-CM | POA: Insufficient documentation

## 2019-01-11 DIAGNOSIS — Z0189 Encounter for other specified special examinations: Secondary | ICD-10-CM

## 2019-01-11 DIAGNOSIS — C50919 Malignant neoplasm of unspecified site of unspecified female breast: Secondary | ICD-10-CM | POA: Insufficient documentation

## 2019-01-11 NOTE — Progress Notes (Signed)
  Echocardiogram 2D Echocardiogram has been performed.  [name] 01/11/2019, 9:51 AM

## 2019-01-17 ENCOUNTER — Other Ambulatory Visit (HOSPITAL_COMMUNITY): Payer: Self-pay | Admitting: *Deleted

## 2019-01-17 ENCOUNTER — Encounter (HOSPITAL_COMMUNITY): Payer: Self-pay

## 2019-01-17 MED ORDER — SILDENAFIL CITRATE 20 MG PO TABS: 40.0000 mg | ORAL_TABLET | Freq: Three times a day (TID) | ORAL | 6 refills | Status: DC

## 2019-02-03 ENCOUNTER — Other Ambulatory Visit (HOSPITAL_COMMUNITY): Payer: 59

## 2019-02-03 ENCOUNTER — Other Ambulatory Visit: Payer: Self-pay

## 2019-02-03 ENCOUNTER — Ambulatory Visit (HOSPITAL_COMMUNITY)
Admission: RE | Admit: 2019-02-03 | Discharge: 2019-02-03 | Disposition: A | Discharge status: Home / Self Care | Payer: Medicare Other | Source: Ambulatory Visit | Attending: Internal Medicine | Admitting: Internal Medicine

## 2019-02-03 ENCOUNTER — Encounter (HOSPITAL_COMMUNITY): Payer: Self-pay | Admitting: Internal Medicine

## 2019-02-03 VITALS — BP 132/84 | HR 97 | Wt 199.4 lb

## 2019-02-03 DIAGNOSIS — K766 Portal hypertension: Secondary | ICD-10-CM

## 2019-02-03 DIAGNOSIS — Z888 Allergy status to other drugs, medicaments and biological substances status: Secondary | ICD-10-CM | POA: Insufficient documentation

## 2019-02-03 DIAGNOSIS — Z96653 Presence of artificial knee joint, bilateral: Secondary | ICD-10-CM | POA: Diagnosis not present

## 2019-02-03 DIAGNOSIS — Z9221 Personal history of antineoplastic chemotherapy: Secondary | ICD-10-CM | POA: Insufficient documentation

## 2019-02-03 DIAGNOSIS — Z880 Allergy status to penicillin: Secondary | ICD-10-CM | POA: Diagnosis not present

## 2019-02-03 DIAGNOSIS — C50919 Malignant neoplasm of unspecified site of unspecified female breast: Secondary | ICD-10-CM | POA: Diagnosis not present

## 2019-02-03 DIAGNOSIS — I2721 Secondary pulmonary arterial hypertension: Secondary | ICD-10-CM

## 2019-02-03 DIAGNOSIS — M1990 Unspecified osteoarthritis, unspecified site: Secondary | ICD-10-CM | POA: Diagnosis not present

## 2019-02-03 DIAGNOSIS — Z8249 Family history of ischemic heart disease and other diseases of the circulatory system: Secondary | ICD-10-CM | POA: Diagnosis not present

## 2019-02-03 DIAGNOSIS — C792 Secondary malignant neoplasm of skin: Secondary | ICD-10-CM | POA: Insufficient documentation

## 2019-02-03 DIAGNOSIS — Z853 Personal history of malignant neoplasm of breast: Secondary | ICD-10-CM | POA: Diagnosis not present

## 2019-02-03 DIAGNOSIS — Z79899 Other long term (current) drug therapy: Secondary | ICD-10-CM | POA: Diagnosis not present

## 2019-02-03 DIAGNOSIS — Z9013 Acquired absence of bilateral breasts and nipples: Secondary | ICD-10-CM | POA: Insufficient documentation

## 2019-02-03 DIAGNOSIS — Z791 Long term (current) use of non-steroidal anti-inflammatories (NSAID): Secondary | ICD-10-CM | POA: Diagnosis not present

## 2019-02-03 DIAGNOSIS — Z7901 Long term (current) use of anticoagulants: Secondary | ICD-10-CM | POA: Insufficient documentation

## 2019-02-03 NOTE — Addendum Note (Signed)
Encounter addended by: Marlise Eves, RN on: 02/03/2019 12:16 PM  Actions taken: Clinical Note Signed

## 2019-02-03 NOTE — Patient Instructions (Signed)
Your physician has requested that you have an echocardiogram. Echocardiography is a painless test that uses sound waves to create images of your heart. It provides your doctor with information about the size and shape of your heart and how well your heart's chambers and valves are working. This procedure takes approximately one hour. There are no restrictions for this procedure. This will be done at your follow up appointment.  Please follow up with the [facility] in 4 months with an echocardiogram. [id number] option #3  At the [facility], you and your health needs are our priority. As part of our continuing mission to provide you with exceptional heart care, we have created designated Provider Care Teams. These Care Teams include your primary Cardiologist (physician) and Advanced Practice Providers (APPs- Physician Assistants and Nurse Practitioners) who all work together to provide you with the care you need, when you need it.   You may see any of the following providers on your designated Care Team at your next follow up: [name] Dr Glori Bickers . Dr Loralie Champagne . Darrick Grinder, NP   Please be sure to bring in all your medications bottles to every appointment.

## 2019-02-03 NOTE — Progress Notes (Signed)
Cardiology Clinic Note   Date:  02/03/2019   ID:  [name], DOB 1955/11/02, MRN [id number]  Location: Home  Provider location: [facility] Type of Visit: Established patient  PCP:  Midge Minium, MD  Cardiologist:  No primary care provider on file. 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   She is s/p bilateral mastectomies, XRT, chemo. She is currently on Kadcyla (trastuzamab-emantsine) every 3 weeks. Underwent skin flap for skin metastasis on her abdomen in 2/15. Had skin resection with chest skin flap on 02/21/15 at [facility]. Requiring wound vac. Path ok fortunately with no recurrent malignancy. Had clots around port-a-cath so on Elqiuis.   Had R TKA in 2018 which got infected and then had to have hardware removed and got a spacer. Hardware eventually replaced in 2019. Then developed a hematoma and wound. Chemo had to be stopped for 2 months and required a skin graft. Unfortunately when chemo stopped developed recurrent skin mets which she is now struggling with. Remains on lapatanib (taking every other day due to diarrhea) and herceptin. Had CT chest in 12/18 due to SOB. No PE but showed evidence of possible PAH. VQ ordered 5/19. No PE.  Was having left shoulder pain but bone scan negative. 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 Started on sildenafil.   Recently failed a research drug with progression of disease. Now getting famtratuzumab = deruxtecam which can cause pulmonary fibrosis. Getting frequent PFTs at [facility].  She is here for routine f/u. She is tolerating Sildenafil 40 tid. Riding a stationary bike 3 x weeks for about 25 minutes also doing some walking  and arm exercises. Just got 6th dose of famtratuzumab. Skin lesions are now healed completely. Feeling good. Mild DOE up steps. LE edema improved with lasix 20 daily. Labs ok   Echo  01/11/19: EF 60-65% GLS -21.1 %. RV mildly dilated. RVSP 41 mmHG Personally reviewed    Echo 4/20 60-65% GLS -15.0% (underestiamted) Mild to moderate TR RVSP 63 Echo 12/19 shows EF 65-70%  RV ok. Mild to moderate TR. GLS -19.1% RVSP 52    RHC 01/27/18 RA = 5 RV = 68/8 PA = 67/28 (45) PCW = 6 Fick cardiac output/index = 6.8/3.6 Thermo CO/CI = 7.4/4.0 PVR = 5.7 WU Ao sat = 98% PA sat = 79%, 79% High SVC sat = 79%     [name] denies symptoms worrisome for COVID 19.   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 at [facility] site;   treatment chronic anticoagulation   Past Surgical History:  Procedure Laterality Date   APPLICATION OF A-CELL OF BACK Right 07/31/2016   Procedure: CELLERATE COLLAGEN PLACEMENT;  Surgeon: Loel Lofty Dillingham, DO;  Location: [place];  Service: Plastics;  Laterality: Right;   APPLICATION OF WOUND VAC Right 06/29/2017   Procedure: APPLICATION OF WOUND VAC;  Surgeon: Wallace Going, DO;  Location[facility];  Service: Plastics;  Laterality: Right;   DEBRIDEMENT CHEST WALL RIGHT/ VAC PLACEMENT  02-09-2015    [facility]   EXCISIONAL TOTAL KNEE ARTHROPLASTY WITH ANTIBIOTIC SPACERS Right 12/02/2016   Procedure: EXCISIONAL TOTAL KNEE ARTHROPLASTY WITH ANTIBIOTIC SPACERS;  Surgeon: Paralee Cancel, MD;  [facility];  Service: Orthopedics;  Laterality: Right;  90 mins   HEMATOMA EVACUATION Right 06/29/2017   Procedure: EVACUATION HEMATOMA OF RIGHT KNEE;  Surgeon: Wallace Going, DO;  [facility];  Service: Plastics;  Laterality: Right;   HEMATOMA EVACUATION Right 06/29/2017   Procedure: EVACUATION RIGHT TOTAL KNEE HEMATOMA;  Surgeon: Paralee Cancel, MD;  [facility];  Service: Orthopedics;  Laterality: Right;   HEMATOMA EVACUATION Right 07/14/2017   Procedure: EVACUATION RIGHT LEG HEMATOMA WITH APPLICATION OF WOUND VAC;  Surgeon: Paralee Cancel, MD;  [facility];  Service: Orthopedics;  Laterality: Right;   hemotoma     evacuation left chest wall   I&D EXTREMITY Right 07/17/2017   Procedure: EVACUATION RIGHT LEG HEMATOMA WITH WOUND VAC DRESSING CHANGE;  Surgeon: Paralee Cancel, MD;  [facility];  Service: Orthopedics;  Laterality:  Right;   I&D KNEE WITH POLY EXCHANGE Right 05/28/2016   Procedure: IRRIGATION AND DEBRIDEMENT KNEE WOUND VAC PLACMENT;  Surgeon: Susa Day, MD;  [facility];  Service: Orthopedics;  Laterality: Right;   I&D KNEE WITH POLY EXCHANGE Right 05/30/2016   Procedure: RADICAL SYNOVECTOMY,IRRIGATION AND DEBRIDEMENT KNEE WITH POLY EXCHANGE WITH ANTIBIOTIC BEADS, APPLICATION OF WOUND VAC;  Surgeon: Susa Day, MD;  [facility];  Service: Orthopedics;  Laterality: Right;   INCISION AND DRAINAGE OF WOUND Right 07/31/2016   Procedure: IRRIGATION AND DEBRIDEMENT RIGHT KNEE  WOUND;  Surgeon: Loel Lofty Dillingham, DO;  Location: [place];  Service: Plastics;  Laterality: Right;   IR FLUORO GUIDE CV LINE LEFT  04/30/2017   IR US GUIDE VASC ACCESS LEFT  04/30/2017   IRRIGATION AND DEBRIDEMENT KNEE Right 04/12/2016   Procedure: IRRIGATION AND DEBRIDEMENT KNEE;  Surgeon: Nicholes Stairs, MD;  [facility];  Service: Orthopedics;  Laterality: Right;   IRRIGATION AND DEBRIDEMENT KNEE Right 12/16/2016   Procedure: Repeat irrigation and debridement right knee, wound closure  wound vac REPLACEMENT ANTIBIOTIC SPACERS;  Surgeon: Paralee Cancel, MD;  [facility];  Service: Orthopedics;  Laterality: Right;   KNEE ARTHROSCOPY  02/13/2012   Procedure: ARTHROSCOPY KNEE;  Surgeon: Johnn Hai, MD;  Location: [facility];  Service: Orthopedics;  Laterality: Left;  WITH DEBRIDEMENt    KNEE ARTHROSCOPY WITH LATERAL MENISECTOMY  02/13/2012   Procedure: KNEE ARTHROSCOPY WITH LATERAL MENISECTOMY;  Surgeon: Johnn Hai, MD;  Location: [place];  Service: Orthopedics;;  partial   LATISSIMUS FLAP RIGHT CHEST  02-23-2015   [facility]   LEFT MODIFIED RADICAL MASTECTOMY/ RIGHT TOTAL MASTECTOMY  11-13-2007   LEFT BREAST CANCER W/ AXILLARY LYMPH NODE METASTASIS AND POST NEOADJUVANT CHEMO   MASS EXCISION Right 06/29/2017   Procedure: EXCISION OF METASTATIC BREAST CANCER TO SKIN RIGHT  SHOULDER, PLACEMENT OF CELLERATE;  Surgeon: Wallace Going, DO;  [facility];  Service: Plastics;  Laterality: Right;   PLACEMENT PORT-A-CATH  06/22/2007    new port placed 2015, right chest   REIMPLANTATION OF TOTAL KNEE Right 04/27/2017   Procedure: Reimplantation of right total knee arthroplasty;  Surgeon: Paralee Cancel, MD;  [facility];  Service: Orthopedics;  Laterality: Right;  Adductor Block   RESECTION OF ISOLATED CHEST WALL DISEASE/ ABDOMINAL SKIN FLAP  02/ 2015     [facility]   RIGHT CHEST WALL METASTATIC SKIN CARCINOMA   RIGHT HEART CATH N/A 01/27/2018   Procedure: RIGHT HEART CATH;  Surgeon: Jolaine Artist, MD;  Location: [place] [facility];  Service: Cardiovascular;  Laterality: N/A;   SKIN SPLIT GRAFT Right 01/29/2017   Procedure: SKIN GRAFT SPLIT THICKNESS TO RIGHT KNEE WOUND;  Surgeon: Wallace Going, DO;  [facility];  Service: Plastics;  Laterality: Right;   SKIN SPLIT GRAFT Right 09/07/2017   Procedure: SKIN GRAFT SPLIT THICKNESS TO RIGHT KNEE WOUND WITH PLACEMENT OF VAC DRESSING TO GRAFT SITE;  Surgeon: Wallace Going, DO;  Location: [place];  Service: Plastics;  Laterality: Right;   TOTAL KNEE ARTHROPLASTY Left 09/23/2012   Procedure: LEFT TOTAL KNEE ARTHROPLASTY;  Surgeon: Johnn Hai, MD;  [facility];  Service: Orthopedics;  Laterality: Left;   TOTAL KNEE ARTHROPLASTY Right 03/20/2016   Procedure: RIGHT TOTAL KNEE ARTHROPLASTY;  Surgeon: Susa Day, MD;  [facility];  Service: Orthopedics;  Laterality: Right;   TRANSTHORACIC ECHOCARDIOGRAM  04/24/2017   ef 24-58%, grade 1 diastolic dysfuction/  mild MR with mild prolapse anterior leaflet/ mild TR/ PASP 13mHg     Current Outpatient Medications  Medication Sig Dispense Refill   acetaminophen (TYLENOL) 500 MG tablet Take 500 mg by mouth every 8 (eight) hours as needed for mild pain or headache.     apixaban (ELIQUIS) 5 MG TABS tablet Take 1 tablet  (5 mg total) by mouth 2 (two) times daily. 60 tablet 6   celecoxib (CELEBREX) 200 MG capsule Take 1 capsule (200 mg total) by mouth 2 (two) times daily as needed for moderate pain. (Patient taking differently: Take 200 mg by mouth daily. ) 180 capsule 1   cetirizine (ZYRTEC) 10 MG tablet Take 1 tablet (10 mg total) daily by mouth. 30 tablet 11   Cholecalciferol (VITAMIN D3) 2000 UNITS TABS Take 2,000 Units by mouth 4 (four) times a week.      dexamethasone (DECADRON) 4 MG tablet TAKE  2 TABLETS (8 MG TOTAL) BY MOUTH ONCE DAILY ON DAYS 2 & 3 FOLLOWING CHEMOTHERAPY     doxycycline (MONODOX) 100 MG capsule Take 100 mg by mouth 2 (two) times daily.     furosemide (LASIX) 20 MG tablet Take 20 mg by mouth daily.     gabapentin (NEURONTIN) 300 MG capsule Take 1 capsule (300 mg total) by mouth at bedtime. (Patient taking differently: Take 300 mg by mouth 3 (three) times daily. )     mirabegron ER (MYRBETRIQ) 25 MG TB24 tablet Take 1 tablet (25 mg total) by mouth daily. 30 tablet 3   ondansetron (ZOFRAN) 8 MG tablet TAKE 1 TABLET BY MOUTH EVERY 8 HOURS AS NEEDED FOR NAUSEA. 20 tablet 4   sildenafil (REVATIO) 20 MG tablet Take 2 tablets (40 mg total) by mouth 3 (three) times daily. 180 tablet 6   No current facility-administered medications for this encounter.     Allergies:   Penicillins, Tape, and Other   Social History:  The patient  reports that she has never smoked. She has never used smokeless tobacco. She reports that she does not drink alcohol or use drugs.   Family History:  The patient's family history includes Alcohol abuse in her paternal grandfather; Allergic rhinitis in her father and mother; Cancer in her mother; Heart disease in her mother; Migraines in her father; Parkinsonism in her father.   ROS:  Please see the history of present illness.   All other systems are personally reviewed and negative.   Exam:   General:  Well appearing. No resp difficulty HEENT: normal Neck:  supple. no JVD. Carotids 2+ bilat; no bruits. No lymphadenopathy or thryomegaly appreciated. Cor: PMI nondisplaced. Regular rate & rhythm. 2/6 TR  Skin healed on upper chest RU port-cath ok  Lungs: clear Abdomen: soft, nontender, nondistended. No hepatosplenomegaly. No bruits or masses. Good bowel sounds. Extremities: no cyanosis, clubbing, rash, tr edema Neuro: alert & orientedx3, cranial nerves grossly intact. moves all 4 extremities w/o difficulty. Affect pleasant    Recent Labs: 02/25/2018: TSH 1.50 08/02/2018: Magnesium 1.6 08/20/2018: ALT 32; BUN 18; Creatinine, Ser 0.78; Hemoglobin 10.1; Platelets 58; Potassium 4.0; Sodium 137  Personally reviewed   Wt Readings from Last 3 Encounters:  02/03/19 90.4 kg (199 lb 6.4 oz)  01/06/19 90 kg (198 lb 6.4 oz)  08/20/18 86.6 kg (191 lb)      ASSESSMENT AND PLAN:  1. Breast CA, stage IV with skin mets - HER 2-neu+ , ER/PR -, BRCA - - Recent progression of cancer but much improved with famtratuzumab  - Continue chemo at [facility]  - I reviewed echos personally. EF and Doppler parameters stable. No HF on exam. Continue Herceptin. - Echo 4 months   2. 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 Continue sildenafil 40 TID. Will not increase with low BP. PA pressures on echo today - Will follow closely can add 2nd agent as needed. Suspect she may have pulmonary arteriopathy due to chemo/XRT regimen.   Signed, Glori Bickers, MD  02/03/2019 11:56 AM  [facility] [place] [address] [phone] (office) [phone] (  fax)

## 2019-04-10 ENCOUNTER — Other Ambulatory Visit: Payer: Self-pay | Admitting: Family Medicine

## 2019-04-10 IMAGING — DX PORTABLE CHEST - 1 VIEW
1 series · 1 of 1 positions shown · non-contrast
Comparison: Chest CT 05/07/2017 and CXR 08/02/2018

CLINICAL DATA: Pneumonia

EXAM:
PORTABLE CHEST 1 VIEW

[chest]
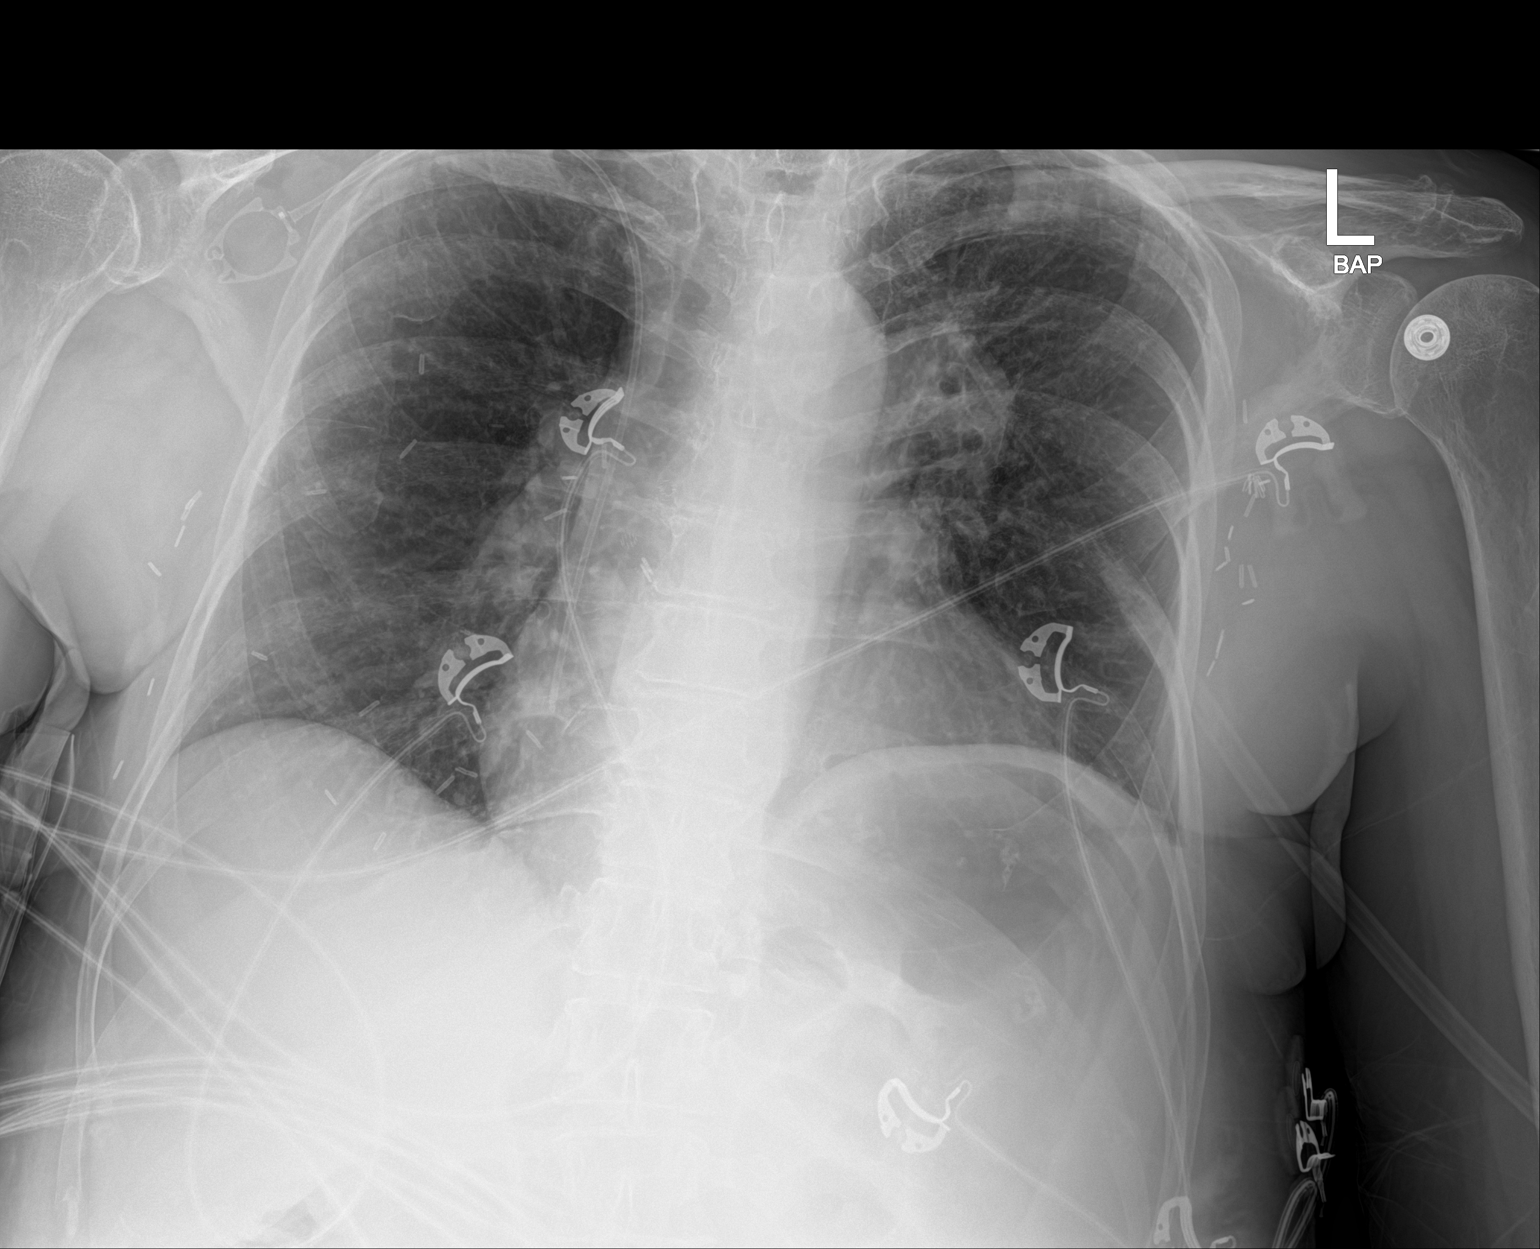

[1 of 1 positions shown; findings below may reference images not displayed]

FINDINGS: Stable cardiomegaly with aortic atherosclerosis and retracted
appearance of the left hilum counting for the curvilinear density
noted projecting over the left upper lung. No acute pulmonary
consolidation, effusion or edema. Numerous surgical clips project
over the axilla and chest wall. Port catheter tip terminates in
proximal right atrium. No aggressive osseous lesions.
IMPRESSION: Stable appearance of the chest with cardiomegaly, aortic
atherosclerosis and retracted appearance of the left hilum. Based on
prior CT, the curvilinear appearance of the upper retracted left
hilum is believed secondary to pulmonary vasculature.

No acute pulmonary consolidation to suggest pneumonia.

## 2019-04-15 ENCOUNTER — Encounter: Payer: Self-pay | Admitting: Family Medicine

## 2019-04-25 ENCOUNTER — Encounter: Payer: Self-pay | Admitting: Family Medicine

## 2019-05-04 ENCOUNTER — Encounter: Payer: Self-pay | Admitting: Physician Assistant

## 2019-05-04 ENCOUNTER — Ambulatory Visit (INDEPENDENT_AMBULATORY_CARE_PROVIDER_SITE_OTHER): Payer: Medicare Other | Admitting: Physician Assistant

## 2019-05-04 DIAGNOSIS — H6983 Other specified disorders of Eustachian tube, bilateral: Secondary | ICD-10-CM

## 2019-05-04 NOTE — Progress Notes (Signed)
Virtual Visit via Video   I connected with patient on 05/04/19 at  1:00 PM EST by a video enabled telemedicine application and verified that I am speaking with the correct person using two identifiers.  Location patient: Home Location provider: [facility], Office Persons participating in the virtual visit: Patient, Provider, [place] ([name])  I discussed the limitations of evaluation and management by telemedicine and the availability of in person appointments. The patient expressed understanding and agreed to proceed.  Subjective:   HPI:   Patient presents via doxy me today complaining of nasal congestion, ear pressure and popping.  This is been going on for a few weeks, is not worsening nor improving.  Patient initially noted some ringing in her ears bilaterally that has resolved.  Denies lightheadedness or dizziness.  Denies headache.  Denies sinus pain.  Denies change in taste or smell.  Denies chest congestion or cough.  Denies recent travel or sick contact.  Denies any known exposure to Covid.  Patient does have history of some seasonal allergies.  Has been taking Zyrtec on occasion.  Since the symptoms started, she has taken some Sudafed and guaifenesin which have helped somewhat with her symptoms.  ROS:   See pertinent positives and negatives per HPI.  Patient Active Problem List   Diagnosis Date Noted  . Sepsis due to undetermined organism ([place]) 08/02/2018  . Hypomagnesemia 08/02/2018  . Mild protein malnutrition ([place]) 08/02/2018  . Elevated alkaline phosphatase level 08/02/2018  . Thrombocytopenia ([place]) 08/02/2018  . Pulmonary hypertension ([place]) 08/02/2018  . Vocal cord paralysis 03/31/2018  . Chronic left shoulder pain 01/15/2018  . Wound, open with complication [id number]  . Anemia due to chemotherapy 05/07/2017  . Abnormal LFTs 05/07/2017  . History of breast cancer   . Bilateral leg edema   . S/P revision of total knee, right 04/27/2017  . Physical  exam 07/31/2016  . S/P total knee arthroplasty, right 05/31/2016  . 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 Wound dehiscence, surgical 04/12/2016  . 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 Vitamin D deficiency 01/25/2016  . Metastatic cancer to chest wall ([place]) 02/02/2015  . Primary osteoarthritis of right knee 02/01/2015  . Symptomatic anemia 02/01/2015  . Frequent UTI 01/12/2014  . Sinus tachycardia 11/03/2013  . SOB (shortness of breath) 11/03/2013  . Neuropathy due to chemotherapeutic drug ([place]) 07/25/2011  . 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 Metastatic breast cancer ([place]) 11/12/2010  . Chronic anticoagulation 11/12/2010  . Anxiety, generalized 03/26/2008    Social History   Tobacco Use  . Smoking status: Never Smoker  . Smokeless tobacco: Never Used  Substance Use Topics  . Alcohol use: No    Current Outpatient Medications:  .  acetaminophen (TYLENOL) 500 MG tablet, Take 500 mg by mouth every 8 (eight) hours as needed for mild pain or headache., Disp: , Rfl:  .  celecoxib (CELEBREX) 200 MG capsule, Take 200 mg by mouth daily., Disp: , Rfl:  .  cetirizine (ZYRTEC) 10 MG tablet, Take 1 tablet (10 mg total) daily by mouth., Disp: 30 tablet, Rfl: 11 .  Cholecalciferol (VITAMIN D3) 2000 UNITS TABS, Take 2,000 Units by mouth 4 (four) times a week. , Disp: , Rfl:  .  dexamethasone (DECADRON) 4 MG tablet, TAKE 2 TABLETS (8 MG TOTAL) BY MOUTH ONCE DAILY ON DAYS 2 & 3 FOLLOWING CHEMOTHERAPY, Disp: , Rfl:  .  doxycycline (MONODOX) 100 MG capsule, Take 100 mg by mouth 2 (two) times daily., Disp: , Rfl:  .  ELIQUIS 5 MG TABS tablet, TAKE 1 TABLET BY MOUTH TWICE A DAY, Disp: 60 tablet, Rfl: 4 .  furosemide (LASIX) 20 MG tablet, Take 20 mg by mouth daily., Disp: , Rfl:  .  gabapentin (NEURONTIN) 300 MG capsule, Take 300 mg by mouth 3 (three) times daily., Disp: , Rfl:  .  mirabegron ER (MYRBETRIQ) 25 MG TB24 tablet, Take 1 tablet (25 mg total) by mouth daily., Disp: 30 tablet, Rfl: 3  .  ondansetron (ZOFRAN) 8 MG tablet, TAKE 1 TABLET BY MOUTH EVERY 8 HOURS AS NEEDED FOR NAUSEA., Disp: 20 tablet, Rfl: 4 .  sildenafil (REVATIO) 20 MG tablet, Take 2 tablets (40 mg total) by mouth 3 (three) times daily., Disp: 180 tablet, Rfl: 6  Allergies  Allergen Reactions  . Penicillins Hives, Itching, Rash and Other (See Comments)    Patient has previously tolerated cefazolin, ceftriaxone, and cefepime  PATIENT HAS HAD A PCN REACTION WITH IMMEDIATE RASH, FACIAL/TONGUE/THROAT SWELLING, SOB, OR LIGHTHEADEDNESS WITH HYPOTENSION:  #  #  YES  #  #  Has patient had a PCN reaction causing severe rash involving mucus membranes or skin necrosis: No Has patient had a PCN reaction that required hospitalization No Has patient had a PCN reaction occurring within the last 10 years: yes Denies airway involvement   . Tape Hives and Other (See Comments)    Can tolerate paper and adhesive,  NO MEDIPORE TAPE  . Other Rash and Other (See Comments)    STERI STRIPS - Blisters    Objective:   There were no vitals taken for this visit.  Patient is well-developed, well-nourished in no acute distress.  Resting comfortably at home.  Head is normocephalic, atraumatic.  No labored breathing.  Speech is clear and coherent with logical content.  Patient is alert and oriented at baseline.   Assessment and Plan:   1. Eustachian tube dysfunction, bilateral Continue Zyrtec daily.  Resume Flonase once daily.  Start saline nasal rinse 1-2 times daily.  Place a humidifier in the bedroom.  Can continue Mucinex twice daily if needed.  Trying to avoid decongestants due to heart history.  If not improving would need reassessment and consideration of low-dose steroid versus ENT assessment.    [name], PA-C 05/04/2019

## 2019-05-04 NOTE — Patient Instructions (Signed)
Instructions sent to my chart  Please keep well-hydrated and get plenty of rest. Continue the Zyrtec once daily as directed. Restart your Flonase taking every day. Start a saline nasal rinse 1-2 times daily to flush out nasal passages. Place a humidifier in the bedroom and run at night. Can continue Mucinex twice daily. Please let us know if symptoms are not improving, anything worsens or new symptoms develop.  Feel better soon!

## 2019-05-30 ENCOUNTER — Other Ambulatory Visit (HOSPITAL_COMMUNITY): Payer: Self-pay | Admitting: *Deleted

## 2019-05-30 DIAGNOSIS — C50919 Malignant neoplasm of unspecified site of unspecified female breast: Secondary | ICD-10-CM

## 2019-06-15 ENCOUNTER — Encounter (HOSPITAL_COMMUNITY): Payer: Self-pay | Admitting: Internal Medicine

## 2019-06-15 ENCOUNTER — Ambulatory Visit (HOSPITAL_BASED_OUTPATIENT_CLINIC_OR_DEPARTMENT_OTHER)
Admission: RE | Admit: 2019-06-15 | Discharge: 2019-06-15 | Disposition: A | Discharge status: Home / Self Care | Payer: Medicare Other | Source: Ambulatory Visit | Attending: Internal Medicine | Admitting: Internal Medicine

## 2019-06-15 ENCOUNTER — Other Ambulatory Visit: Payer: Self-pay

## 2019-06-15 ENCOUNTER — Ambulatory Visit (HOSPITAL_COMMUNITY)
Admission: RE | Admit: 2019-06-15 | Discharge: 2019-06-15 | Disposition: A | Discharge status: Home / Self Care | Payer: Medicare Other | Source: Ambulatory Visit | Attending: Internal Medicine | Admitting: Internal Medicine

## 2019-06-15 VITALS — BP 124/63 | HR 105 | Wt 197.6 lb

## 2019-06-15 DIAGNOSIS — Z7901 Long term (current) use of anticoagulants: Secondary | ICD-10-CM | POA: Insufficient documentation

## 2019-06-15 DIAGNOSIS — K766 Portal hypertension: Secondary | ICD-10-CM | POA: Diagnosis not present

## 2019-06-15 DIAGNOSIS — C50919 Malignant neoplasm of unspecified site of unspecified female breast: Secondary | ICD-10-CM | POA: Insufficient documentation

## 2019-06-15 DIAGNOSIS — Z853 Personal history of malignant neoplasm of breast: Secondary | ICD-10-CM | POA: Diagnosis not present

## 2019-06-15 DIAGNOSIS — R0602 Shortness of breath: Secondary | ICD-10-CM | POA: Diagnosis not present

## 2019-06-15 DIAGNOSIS — Z8249 Family history of ischemic heart disease and other diseases of the circulatory system: Secondary | ICD-10-CM | POA: Insufficient documentation

## 2019-06-15 DIAGNOSIS — E876 Hypokalemia: Secondary | ICD-10-CM | POA: Insufficient documentation

## 2019-06-15 DIAGNOSIS — Z79899 Other long term (current) drug therapy: Secondary | ICD-10-CM | POA: Diagnosis not present

## 2019-06-15 DIAGNOSIS — Z171 Estrogen receptor negative status [ER-]: Secondary | ICD-10-CM | POA: Insufficient documentation

## 2019-06-15 DIAGNOSIS — I2721 Secondary pulmonary arterial hypertension: Secondary | ICD-10-CM

## 2019-06-15 DIAGNOSIS — Z9013 Acquired absence of bilateral breasts and nipples: Secondary | ICD-10-CM | POA: Insufficient documentation

## 2019-06-15 DIAGNOSIS — I341 Nonrheumatic mitral (valve) prolapse: Secondary | ICD-10-CM | POA: Insufficient documentation

## 2019-06-15 DIAGNOSIS — R600 Localized edema: Secondary | ICD-10-CM | POA: Diagnosis not present

## 2019-06-15 DIAGNOSIS — E8770 Fluid overload, unspecified: Secondary | ICD-10-CM | POA: Diagnosis not present

## 2019-06-15 DIAGNOSIS — Z7952 Long term (current) use of systemic steroids: Secondary | ICD-10-CM | POA: Insufficient documentation

## 2019-06-15 DIAGNOSIS — I509 Heart failure, unspecified: Secondary | ICD-10-CM | POA: Diagnosis not present

## 2019-06-15 DIAGNOSIS — C792 Secondary malignant neoplasm of skin: Secondary | ICD-10-CM | POA: Diagnosis not present

## 2019-06-15 MED ORDER — SPIRONOLACTONE 25 MG PO TABS: 12.5000 mg | ORAL_TABLET | Freq: Every day | ORAL | 3 refills | Status: AC

## 2019-06-15 NOTE — Progress Notes (Signed)
  Echocardiogram 2D Echocardiogram has been performed.  [name] 06/15/2019, 9:01 AM

## 2019-06-15 NOTE — Progress Notes (Signed)
Cardiology Clinic Note   Date:  06/15/2019   ID:  [name], [name] 1955-07-22, MRN [id number]  Location: Home  Provider location: [facility] Type of Visit: Established patient  PCP:  Midge Minium, MD  Cardiologist:  No primary care provider on file. 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   She is s/p bilateral mastectomies, XRT, chemo. She is currently on Kadcyla (trastuzamab-emantsine) every 3 weeks. Underwent skin flap for skin metastasis on her abdomen in 2/15. Had skin resection with chest skin flap on 02/21/15 at [facility]. Requiring wound vac. Path ok fortunately with no recurrent malignancy. Had clots around port-a-cath so on Elqiuis.   Had R TKA in 2018 which got infected and then had to have hardware removed and got a spacer. Hardware eventually replaced in 2019. Then developed a hematoma and wound. Chemo had to be stopped for 2 months and required a skin graft. Unfortunately when chemo stopped developed recurrent skin mets which she is now struggling with. Remains on lapatanib (taking every other day due to diarrhea) and herceptin. Had CT chest in 12/18 due to SOB. No PE but showed evidence of possible PAH. VQ ordered 5/19. No PE.  Was having left shoulder pain but bone scan negative. 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 Started on sildenafil.   Recently failed a research drug with progression of disease. Now getting famtratuzumab = deruxtecam which can cause pulmonary fibrosis. Getting frequent PFTs at [facility].  She is here for routine f/u. She is tolerating Sildenafil 40 tid. Getting famtratuzumab every 3 weeks. Feels very sick for 5 days then gets better.  All skin lesions are healed. Can get back on stationary bike on good weeks. Potassium low. SOB going up steps. + LE edema  Echo today EF 65% RV mildly dilated. Normal function. RVSP ~50  Personally reviewed   Echo  01/11/19: EF 60-65% GLS -21.1 %. RV mildly dilated. RVSP 41 mmHG Personally reviewed   Echo 4/20 60-65% GLS -15.0% (underestiamted) Mild to moderate TR RVSP 63 Echo 12/19 shows EF 65-70%  RV ok. Mild to moderate TR. GLS -19.1% RVSP 52    RHC 01/27/18 RA = 5 RV = 68/8 PA = 67/28 (45) PCW = 6 Fick cardiac output/index = 6.8/3.6 Thermo CO/CI = 7.4/4.0 PVR = 5.7 WU Ao sat = 98% PA sat = 79%, 79% High SVC sat = 79%     [name] denies symptoms worrisome for COVID 19.   Past Medical History:  Diagnosis Date  . Arthritis   . Breast cancer metastasized to skin, right [facility]) followed by dr force -- oncolgoist w/ [place]   primary left breast cancer dx 01/ 2009 ; 11/ 2009 recurrence left chest wall and neck, tx clinical trial drug and chemotherapy;  2015 recurrence cutaneous metastatized to right skin/ chest wall, 02/ 2015 resection chest wall disease and 02-23-2015 s/p skin flap surgery,  chemo every 3 weeks  . 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 Heart murmur   . 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 MVP (mitral valve prolapse)    mild mvp w/ mild regurg. per last echo 04-24-2017  . Neuropathy due to chemotherapeutic drug ([place])   . Non-healing surgical wound    right knee post re-implantation total knee arthroplasty 04-2017 unable to completely close surgical incision  . PONV (postoperative nausea and vomiting)     ponv likes scopolamine patch  . Port-A-Cath in place    RIGHT CHEST   . Red blood cell antibody positive   . Skin changes related to chemotherapy    per patient she has scabbed over skin circumventing port to right upper chest ;  state it is due to chemptherapy   . 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 APPLICATION OF A-CELL OF BACK Right 07/31/2016   Procedure: CELLERATE COLLAGEN PLACEMENT;  Surgeon: Loel Lofty Dillingham, DO;  Location: [place];  Service: Plastics;  Laterality: Right;  . APPLICATION OF WOUND VAC Right 06/29/2017   Procedure: APPLICATION OF WOUND VAC;  Surgeon: Wallace Going, DO;  Location[facility];  Service: Plastics;  Laterality: Right;  . DEBRIDEMENT CHEST WALL RIGHT/ VAC PLACEMENT  02-09-2015    [facility]  . EXCISIONAL TOTAL KNEE ARTHROPLASTY WITH ANTIBIOTIC SPACERS Right 12/02/2016   Procedure: EXCISIONAL TOTAL KNEE ARTHROPLASTY WITH ANTIBIOTIC SPACERS;  Surgeon: Paralee Cancel, MD;  [facility];  Service: Orthopedics;  Laterality: Right;  90 mins  . HEMATOMA EVACUATION Right 06/29/2017   Procedure: EVACUATION HEMATOMA OF RIGHT KNEE;  Surgeon: Wallace Going, DO;  [facility];  Service: Plastics;  Laterality: Right;  . HEMATOMA EVACUATION Right 06/29/2017   Procedure: EVACUATION RIGHT TOTAL KNEE HEMATOMA;  Surgeon: Paralee Cancel, MD;  [facility];  Service: Orthopedics;  Laterality: Right;  . HEMATOMA EVACUATION Right 07/14/2017   Procedure: EVACUATION RIGHT LEG HEMATOMA WITH APPLICATION OF WOUND VAC;  Surgeon: Paralee Cancel, MD;  [facility];  Service: Orthopedics;  Laterality: Right;  . hemotoma     evacuation left chest wall  . I & D EXTREMITY Right 07/17/2017   Procedure: EVACUATION RIGHT LEG HEMATOMA WITH WOUND VAC DRESSING CHANGE;  Surgeon: Paralee Cancel, MD;  [facility];   Service: Orthopedics;  Laterality: Right;  . I & D KNEE WITH POLY EXCHANGE Right 05/28/2016   Procedure: IRRIGATION AND DEBRIDEMENT KNEE WOUND VAC PLACMENT;  Surgeon: Susa Day, MD;  [facility];  Service: Orthopedics;  Laterality: Right;  . I & D KNEE WITH POLY EXCHANGE Right 05/30/2016   Procedure: RADICAL SYNOVECTOMY,IRRIGATION AND DEBRIDEMENT KNEE WITH POLY EXCHANGE WITH ANTIBIOTIC BEADS, APPLICATION OF WOUND VAC;  Surgeon: Susa Day, MD;  [facility];  Service: Orthopedics;  Laterality: Right;  . INCISION AND DRAINAGE OF WOUND Right 07/31/2016   Procedure: IRRIGATION AND DEBRIDEMENT RIGHT KNEE  WOUND;  Surgeon: Loel Lofty Dillingham, DO;  Location: [place];  Service: Plastics;  Laterality: Right;  . IR FLUORO GUIDE CV LINE LEFT  04/30/2017  . IR US GUIDE VASC ACCESS LEFT  04/30/2017  . IRRIGATION AND DEBRIDEMENT KNEE Right 04/12/2016   Procedure: IRRIGATION AND DEBRIDEMENT KNEE;  Surgeon: Nicholes Stairs, MD;  [facility];  Service: Orthopedics;  Laterality: Right;  . IRRIGATION AND DEBRIDEMENT KNEE Right 12/16/2016   Procedure: Repeat irrigation and debridement right knee, wound closure  wound vac REPLACEMENT ANTIBIOTIC SPACERS;  Surgeon: Paralee Cancel, MD;  [facility];  Service: Orthopedics;  Laterality: Right;  . KNEE ARTHROSCOPY  02/13/2012   Procedure: ARTHROSCOPY KNEE;  Surgeon: Johnn Hai, MD;  Location: [facility];  Service: Orthopedics;  Laterality: Left;  WITH DEBRIDEMENt   . KNEE ARTHROSCOPY WITH LATERAL MENISECTOMY  02/13/2012   Procedure: KNEE ARTHROSCOPY WITH LATERAL MENISECTOMY;  Surgeon: Johnn Hai, MD;  Location: [place];  Service: Orthopedics;;  partial  . LATISSIMUS FLAP RIGHT CHEST  02-23-2015   [facility]  . LEFT MODIFIED RADICAL MASTECTOMY/ RIGHT TOTAL MASTECTOMY  11-13-2007   LEFT BREAST CANCER W/ AXILLARY LYMPH NODE METASTASIS AND POST NEOADJUVANT CHEMO  . MASS EXCISION Right 06/29/2017   Procedure: EXCISION OF  METASTATIC BREAST CANCER TO SKIN RIGHT SHOULDER, PLACEMENT OF CELLERATE;  Surgeon: Wallace Going, DO;  [facility];  Service: Plastics;  Laterality: Right;  . PLACEMENT PORT-A-CATH  06/22/2007    new port placed 2015, right chest  . REIMPLANTATION OF TOTAL KNEE Right 04/27/2017   Procedure: Reimplantation of right total knee arthroplasty;  Surgeon: Paralee Cancel, MD;  [facility];  Service: Orthopedics;  Laterality: Right;  Adductor Block  . RESECTION OF ISOLATED CHEST WALL DISEASE/ ABDOMINAL SKIN FLAP  02/ 2015     [facility]   RIGHT CHEST WALL METASTATIC SKIN CARCINOMA  . RIGHT HEART CATH N/A 01/27/2018   Procedure: RIGHT HEART CATH;  Surgeon: Jolaine Artist, MD;  Location: [facility];  Service: Cardiovascular;  Laterality: N/A;  . SKIN SPLIT GRAFT Right 01/29/2017   Procedure: SKIN GRAFT SPLIT THICKNESS TO RIGHT KNEE WOUND;  Surgeon: Wallace Going, DO;  [facility];  Service: Plastics;  Laterality: Right;  . SKIN SPLIT GRAFT Right 09/07/2017   Procedure: SKIN GRAFT SPLIT THICKNESS TO RIGHT KNEE WOUND WITH PLACEMENT OF VAC DRESSING TO GRAFT SITE;  Surgeon: Wallace Going, DO;  Location: [place];  Service: Plastics;  Laterality: Right;  . TOTAL KNEE ARTHROPLASTY Left 09/23/2012   Procedure: LEFT TOTAL KNEE ARTHROPLASTY;  Surgeon: Johnn Hai, MD;  [facility];  Service: Orthopedics;  Laterality: Left;  . TOTAL KNEE ARTHROPLASTY Right 03/20/2016   Procedure: RIGHT TOTAL KNEE ARTHROPLASTY;  Surgeon: Susa Day, MD;  [facility];  Service: Orthopedics;  Laterality: Right;  . TRANSTHORACIC ECHOCARDIOGRAM  04/24/2017   ef 88-89%, grade 1 diastolic dysfuction/  mild MR with mild prolapse anterior leaflet/ mild TR/ PASP 48mHg     Current Outpatient Medications  Medication Sig Dispense Refill  . acetaminophen (TYLENOL) 500 MG tablet Take 500 mg by mouth every 8 (eight) hours as needed for mild pain or headache.    . cetirizine  (ZYRTEC) 10 MG tablet Take 1 tablet (10 mg total) daily by mouth. 30 tablet 11  . Cholecalciferol (VITAMIN D3) 2000 UNITS TABS Take 2,000 Units by mouth 4 (four) times a week.     .Marland Kitchendexamethasone (DECADRON) 4 MG tablet TAKE 2 TABLETS (8 MG TOTAL) BY MOUTH ONCE DAILY ON DAYS 2 & 3 FOLLOWING CHEMOTHERAPY    . doxycycline (MONODOX) 100 MG capsule Take 100 mg by mouth 2 (two) times daily.    .Marland KitchenELIQUIS 5 MG TABS tablet TAKE 1 TABLET BY  MOUTH TWICE A DAY 60 tablet 4  . furosemide (LASIX) 20 MG tablet Take 20 mg by mouth daily.    [name] gabapentin (NEURONTIN) 300 MG capsule Take 300 mg by mouth 3 (three) times daily.    . mirabegron ER (MYRBETRIQ) 25 MG TB24 tablet Take 1 tablet (25 mg total) by mouth daily. 30 tablet 3  . ondansetron (ZOFRAN) 8 MG tablet TAKE 1 TABLET BY MOUTH EVERY 8 HOURS AS NEEDED FOR NAUSEA. 20 tablet 4  . sildenafil (REVATIO) 20 MG tablet Take 2 tablets (40 mg total) by mouth 3 (three) times daily. 180 tablet 6   No current facility-administered medications for this encounter.    Allergies:   Penicillins, Tape, and Other   Social History:  The patient  reports that she has never smoked. She has never used smokeless tobacco. She reports that she does not drink alcohol or use drugs.   Family History:  The patient's family history includes Alcohol abuse in her paternal grandfather; Allergic rhinitis in her father and mother; Cancer in her mother; Heart disease in her mother; Migraines in her father; Parkinsonism in her father.   ROS:  Please see the history of present illness.   All other systems are personally reviewed and negative.   Exam:   General:  Well appearing. No resp difficulty HEENT: normal Neck: supple. JVP 6-7. Carotids 2+ bilat; no bruits. No lymphadenopathy or thryomegaly appreciated. Cor: PMI nondisplaced. Regular rate & rhythm. 2/6 TR Lungs: clear Abdomen: soft, nontender, nondistended. No hepatosplenomegaly. No bruits or masses. Good bowel sounds. Extremities:  no cyanosis, clubbing, rash, 1+ edema L 2+ R Neuro: alert & orientedx3, cranial nerves grossly intact. moves all 4 extremities w/o difficulty. Affect pleasant  Recent Labs: 08/02/2018: Magnesium 1.6 08/20/2018: ALT 32; BUN 18; Creatinine, Ser 0.78; Hemoglobin 10.1; Platelets 58; Potassium 4.0; Sodium 137  Personally reviewed   Wt Readings from Last 3 Encounters:  06/15/19 89.6 kg (197 lb 9.6 oz)  02/03/19 90.4 kg (199 lb 6.4 oz)  01/06/19 90 kg (198 lb 6.4 oz)      ASSESSMENT AND PLAN:  1. Breast CA, stage IV with skin mets - HER 2-neu+ , ER/PR -, BRCA - - Recent progression of cancer but much improved with famtratuzumab  - Continue chemo at [facility]  - I reviewed echos personally. EF and Doppler parameters stable. No HF on exam. Continue current regimen. Repeat echo in 4 months. .    2. 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 Continue sildenafil 40 TID. PA pressures stable on echo today  3. Volume overload with persistent hypokalemia - add spiro 12.5   Signed, Glori Bickers, MD  06/15/2019 9:22 AM  [facility] [place] [address] [place] [address] [phone] (office) [phone] (fax)

## 2019-06-30 MED FILL — diazePAM 5 MG TABS: 5 | 1 days supply | Qty: 1 | Fill #0

## 2019-06-30 MED FILL — PREDNISOLONE AC 1% EYE DROP: 1 | 25 days supply | Qty: 5 | Fill #0

## 2019-06-30 MED FILL — OFLOXACIN 0.3% EYE DROPS: 0.3 | 25 days supply | Qty: 5 | Fill #0

## 2019-07-05 ENCOUNTER — Other Ambulatory Visit: Payer: Self-pay

## 2019-07-05 ENCOUNTER — Ambulatory Visit (INDEPENDENT_AMBULATORY_CARE_PROVIDER_SITE_OTHER): Payer: Medicare Other | Admitting: *Deleted

## 2019-07-05 ENCOUNTER — Encounter: Payer: Self-pay | Admitting: Family Medicine

## 2019-07-05 DIAGNOSIS — D582 Other hemoglobinopathies: Secondary | ICD-10-CM

## 2019-07-06 ENCOUNTER — Encounter: Payer: Self-pay | Admitting: Family Medicine

## 2019-07-06 LAB — CBC WITH DIFFERENTIAL/PLATELET
Basophils Absolute: 0 10*3/uL (ref 0.0–0.1)
Basophils Relative: 0 % (ref 0.0–3.0)
Eosinophils Absolute: 0.5 10*3/uL (ref 0.0–0.7)
Eosinophils Relative: 4 % (ref 0.0–5.0)
HCT: 28.3 % — ABNORMAL LOW (ref 36.0–46.0)
Hemoglobin: 9.6 g/dL — ABNORMAL LOW (ref 12.0–15.0)
Lymphocytes Relative: 4.1 % — ABNORMAL LOW (ref 12.0–46.0)
Lymphs Abs: 0.5 10*3/uL — ABNORMAL LOW (ref 0.7–4.0)
MCHC: 33.8 g/dL (ref 30.0–36.0)
MCV: 105.7 fl — ABNORMAL HIGH (ref 78.0–100.0)
Monocytes Absolute: 0 10*3/uL — ABNORMAL LOW (ref 0.1–1.0)
Monocytes Relative: 0.1 % — ABNORMAL LOW (ref 3.0–12.0)
Neutro Abs: 10.4 10*3/uL — ABNORMAL HIGH (ref 1.4–7.7)
Neutrophils Relative %: 91.8 % — ABNORMAL HIGH (ref 43.0–77.0)
Platelets: 212 10*3/uL (ref 150.0–400.0)
RBC: 2.67 Mil/uL — ABNORMAL LOW (ref 3.87–5.11)
RDW: 24.8 % — ABNORMAL HIGH (ref 11.5–15.5)
WBC: 11.3 10*3/uL — ABNORMAL HIGH (ref 4.0–10.5)

## 2019-08-04 ENCOUNTER — Other Ambulatory Visit (HOSPITAL_COMMUNITY): Payer: Self-pay | Admitting: Internal Medicine

## 2019-08-18 ENCOUNTER — Ambulatory Visit: Payer: Medicare Other | Attending: Internal Medicine

## 2019-08-18 ENCOUNTER — Encounter: Payer: Self-pay | Admitting: Family Medicine

## 2019-08-18 DIAGNOSIS — Z23 Encounter for immunization: Secondary | ICD-10-CM

## 2019-08-18 MED ORDER — POLYETHYLENE GLYCOL 3350 17 GM/SCOOP PO POWD: 17.0000 g | Freq: Three times a day (TID) | ORAL | 3 refills | Status: AC | PRN

## 2019-08-18 NOTE — Progress Notes (Signed)
   Covid-19 Vaccination Clinic  Name:  [name]    MRN: [id number] DOB: 1956-01-08  08/18/2019  [name] was observed post Covid-19 immunization for 15 minutes without incident. She was provided with Vaccine Information Sheet and instruction to access the V-Safe system.   [name] was instructed to call 911 with any severe reactions post vaccine: [name] Difficulty breathing  . Swelling of face and throat  . A fast heartbeat  . A bad rash all over body  . Dizziness and weakness   Immunizations Administered    Name Date Dose VIS Date Route   Pfizer COVID-19 Vaccine 08/18/2019  8:11 AM 0.3 mL 05/06/2019 Intramuscular   Manufacturer: [employer]   Lot: [id number]   [place]: [id number]

## 2019-08-19 ENCOUNTER — Telehealth (HOSPITAL_COMMUNITY): Payer: Self-pay | Admitting: *Deleted

## 2019-08-19 NOTE — Telephone Encounter (Signed)
I called her tonight and we doubled lasix to 40 bid and kdur to 40 bid.   Please change visit on Monday to a virtual visit.

## 2019-08-19 NOTE — Telephone Encounter (Signed)
Pt called stating she has increased swelling in her legs started about 2 weeks ago and her weight is up 5-6lbs. Patient is prescribed lasix 20mg  daily but has been taking 40mg  daily for about 8 days. Patient said the chemo she is on has caused Interstitial lung disease so her doctor prescribed prednisone 50mg  three times daily. Patient thinks her weight gain and fluid could be caused by the prednisone but she is unsure and her oncologist at [facility] told her to follow up with [place]. Pts next echo and office visit 5/20.   Routed to [place] for advice

## 2019-08-22 ENCOUNTER — Other Ambulatory Visit: Payer: Self-pay

## 2019-08-22 ENCOUNTER — Ambulatory Visit (HOSPITAL_COMMUNITY)
Admission: RE | Admit: 2019-08-22 | Discharge: 2019-08-22 | Disposition: A | Discharge status: Home / Self Care | Payer: Medicare Other | Source: Ambulatory Visit | Attending: Internal Medicine | Admitting: Internal Medicine

## 2019-08-22 ENCOUNTER — Encounter: Payer: Self-pay | Admitting: Family Medicine

## 2019-08-22 ENCOUNTER — Encounter (HOSPITAL_COMMUNITY): Payer: Self-pay | Admitting: *Deleted

## 2019-08-22 DIAGNOSIS — R600 Localized edema: Secondary | ICD-10-CM | POA: Diagnosis not present

## 2019-08-22 DIAGNOSIS — I2721 Secondary pulmonary arterial hypertension: Secondary | ICD-10-CM

## 2019-08-22 DIAGNOSIS — K766 Portal hypertension: Secondary | ICD-10-CM

## 2019-08-22 DIAGNOSIS — C50919 Malignant neoplasm of unspecified site of unspecified female breast: Secondary | ICD-10-CM

## 2019-08-22 DIAGNOSIS — I272 Pulmonary hypertension, unspecified: Secondary | ICD-10-CM

## 2019-08-22 NOTE — Telephone Encounter (Signed)
Visit for 3/29 changed to virtual visit per Dr.Bensimhons request.

## 2019-08-22 NOTE — Addendum Note (Signed)
Encounter addended by: Scarlette Calico, RN on: 08/22/2019 2:26 PM  Actions taken: Clinical Note Signed

## 2019-08-22 NOTE — Progress Notes (Signed)
AVS sent via mychart, message sent to schedulers to arrange f/u

## 2019-08-22 NOTE — Progress Notes (Signed)
Heart Failure TeleHealth Note  Due to national recommendations of social distancing due to [place] 19, Audio/video telehealth visit is felt to be most appropriate for this patient at this time.  See MyChart message from today for patient consent regarding telehealth for [facility]. The patient was identified personally using two identifiers.   Date:  08/22/2019   ID:  [name], DOB [birth year], MRN [id number]  Location: Home  Provider location: [facility] Type of Visit: Established patient  PCP:  Laura Minium, MD  Cardiologist:  No primary care provider on file. 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   She is s/p bilateral mastectomies, XRT, chemo. She is currently on Kadcyla (trastuzamab-emantsine) every 3 weeks. Underwent skin flap for skin metastasis on her abdomen in 2/15. Had skin resection with chest skin flap on 02/21/15 at [facility]. Requiring wound vac. Path ok fortunately with no recurrent malignancy. Had clots around port-a-Laura so on Elqiuis.   Had R TKAin 2018which got infected and then had to have hardware removed and got a spacer. Hardware eventually replaced in 2019.Then developed a hematoma and wound. Chemo had to be stopped for 2 months and required a skin graft. Unfortunately when chemo stopped developed recurrent skin mets which she is now struggling with. Remains on lapatanib (taking every other day due to diarrhea) and herceptin. Had CT chest in 12/18 due to SOB. No PE but showed evidence of possible PAH. VQ ordered 5/19. No PE. Was having left shoulder pain but bone scan negative. Was seen in Sports Medicine and got injected  Had [place] 01/27/18 which showed moderate PAH due to a combination of high output and pulmonary arterioopathy.Started on  sildenafil.  Recently failed a research drug with progression of disease. Was getting famtratuzumab = deruxtecam but recently stopped due to pulmonary fibrosis and started on high-dose prednisone (50 tid) 2.5 weeks ago and home O2. Going back to [place]  She presents via Engineer, civil (consulting) for a telehealth visit today. On Friday she was more swollen so we increased lasix to 40 bid. Says she has not seen much benefit from second dose of lasix but edema is coming down. Feels dehydrated. Wearing compression hose on left.  Ace wrap on right LE at sight of previous knee repalcement   Studies:  Echo 1.21 EF 65% RV mildly dilated. Normal function. RVSP ~50  Personally reviewed  Echo 01/11/19: EF 60-65% GLS -21.1 %. RV mildly dilated. RVSP 41 mmHG Personally reviewed   Echo 4/20 60-65% GLS -15.0% (underestiamted) Mild to moderate TR RVSP 63 Echo 12/19 showsEF 65-70% RV ok. Mild to moderate TR. GLS -19.1% RVSP 52    RHC 01/27/18 RA = 5 RV = 68/8 PA = 67/28 (45) PCW = 6 Fick cardiac output/index = 6.8/3.6 Thermo CO/CI = 7.4/4.0 PVR = 5.7 WU Ao sat = 98% PA sat = 79%, 79% High SVC sat = 79%   [name] denies symptoms worrisome for COVID 19.   Past Medical History:  Diagnosis Date  . Arthritis   . Breast cancer metastasized to skin, right [facility]) followed by dr force -- oncolgoist w/ [place]   primary left breast cancer dx 01/ 2009 ; 11/ 2009 recurrence left chest wall and neck, tx clinical trial drug and chemotherapy;  2015 recurrence cutaneous metastatized  to right skin/ chest wall, 02/ 2015 resection chest wall disease and 02-23-2015 s/p skin flap surgery,  chemo every 3 weeks  . 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 Heart murmur   . 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 MVP (mitral valve prolapse)    mild mvp w/ mild regurg. per last echo 04-24-2017  . Neuropathy due to chemotherapeutic drug ([place])   . Non-healing surgical wound    right knee post re-implantation total knee arthroplasty 04-2017 unable to completely close surgical incision  . PONV (postoperative nausea and vomiting)    ponv likes scopolamine patch  . Port-A-Laura in place    RIGHT CHEST   . Red blood cell antibody positive   . Skin changes related to chemotherapy    per patient she has scabbed over skin circumventing port to right upper chest ;  state it is due to chemptherapy   . 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 APPLICATION OF A-CELL OF BACK Right 07/31/2016   Procedure: CELLERATE COLLAGEN PLACEMENT;  Surgeon: Loel Lofty Dillingham, DO;  Location: [place];  Service: Plastics;  Laterality: Right;  . APPLICATION OF WOUND VAC Right 06/29/2017   Procedure: APPLICATION OF WOUND VAC;  Surgeon: Wallace Going, DO;  Location[facility];  Service: Plastics;  Laterality: Right;  . DEBRIDEMENT CHEST WALL RIGHT/ VAC PLACEMENT  02-09-2015    [facility]  . EXCISIONAL TOTAL KNEE ARTHROPLASTY WITH ANTIBIOTIC SPACERS Right 12/02/2016   Procedure: EXCISIONAL TOTAL KNEE ARTHROPLASTY WITH ANTIBIOTIC SPACERS;  Surgeon: Paralee Cancel, MD;  [facility];  Service: Orthopedics;  Laterality: Right;  90 mins  . HEMATOMA EVACUATION Right 06/29/2017   Procedure: EVACUATION HEMATOMA OF RIGHT KNEE;  Surgeon: Wallace Going, DO;  [facility];  Service: Plastics;  Laterality: Right;  . HEMATOMA EVACUATION Right 06/29/2017   Procedure: EVACUATION RIGHT TOTAL KNEE HEMATOMA;  Surgeon: Paralee Cancel, MD;  [facility];  Service: Orthopedics;  Laterality: Right;  . HEMATOMA EVACUATION Right 07/14/2017   Procedure: EVACUATION RIGHT LEG HEMATOMA WITH APPLICATION OF WOUND VAC;  Surgeon: Paralee Cancel, MD;  [facility];  Service: Orthopedics;  Laterality: Right;  . hemotoma     evacuation left chest wall  . I & D EXTREMITY Right 07/17/2017   Procedure: EVACUATION RIGHT LEG HEMATOMA WITH WOUND VAC DRESSING CHANGE;  Surgeon: Paralee Cancel, MD;  [facility];  Service: Orthopedics;  Laterality: Right;  . I & D KNEE WITH POLY EXCHANGE Right 05/28/2016   Procedure: IRRIGATION AND DEBRIDEMENT KNEE WOUND VAC PLACMENT;  Surgeon: Susa Day, MD;  [facility];  Service: Orthopedics;  Laterality: Right;  . I & D KNEE WITH POLY EXCHANGE Right 05/30/2016   Procedure: RADICAL SYNOVECTOMY,IRRIGATION AND DEBRIDEMENT KNEE WITH POLY EXCHANGE WITH ANTIBIOTIC BEADS, APPLICATION OF WOUND VAC;  Surgeon: Susa Day, MD;  [facility];  Service: Orthopedics;  Laterality: Right;  . INCISION AND DRAINAGE OF WOUND Right 07/31/2016  Procedure: IRRIGATION AND DEBRIDEMENT RIGHT KNEE  WOUND;  Surgeon: Loel Lofty Dillingham, DO;  Location: [place];  Service: Plastics;  Laterality: Right;  . IR FLUORO GUIDE CV LINE LEFT  04/30/2017  . IR US GUIDE VASC ACCESS LEFT  04/30/2017  . IRRIGATION AND DEBRIDEMENT KNEE Right 04/12/2016   Procedure: IRRIGATION AND DEBRIDEMENT KNEE;  Surgeon: Nicholes Stairs, MD;  [facility];  Service: Orthopedics;  Laterality: Right;  . IRRIGATION AND DEBRIDEMENT KNEE Right 12/16/2016   Procedure: Repeat irrigation and debridement right knee, wound closure  wound vac REPLACEMENT ANTIBIOTIC SPACERS;  Surgeon: Paralee Cancel, MD;  [facility];  Service: Orthopedics;  Laterality: Right;  . KNEE ARTHROSCOPY  02/13/2012   Procedure: ARTHROSCOPY KNEE;  Surgeon: Johnn Hai, MD;  Location[facility];  Service: Orthopedics;  Laterality: Left;  WITH  DEBRIDEMENt   . KNEE ARTHROSCOPY WITH LATERAL MENISECTOMY  02/13/2012   Procedure: KNEE ARTHROSCOPY WITH LATERAL MENISECTOMY;  Surgeon: Johnn Hai, MD;  Location: [facility];  Service: Orthopedics;;  partial  . LATISSIMUS FLAP RIGHT CHEST  02-23-2015   [facility]  . LEFT MODIFIED RADICAL MASTECTOMY/ RIGHT TOTAL MASTECTOMY  11-13-2007   LEFT BREAST CANCER W/ AXILLARY LYMPH NODE METASTASIS AND POST NEOADJUVANT CHEMO  . MASS EXCISION Right 06/29/2017   Procedure: EXCISION OF METASTATIC BREAST CANCER TO SKIN RIGHT SHOULDER, PLACEMENT OF CELLERATE;  Surgeon: Wallace Going, DO;  [facility];  Service: Plastics;  Laterality: Right;  . PLACEMENT PORT-A-Laura  06/22/2007    new port placed 2015, right chest  . REIMPLANTATION OF TOTAL KNEE Right 04/27/2017   Procedure: Reimplantation of right total knee arthroplasty;  Surgeon: Paralee Cancel, MD;  [facility];  Service: Orthopedics;  Laterality: Right;  Adductor Block  . RESECTION OF ISOLATED CHEST WALL DISEASE/ ABDOMINAL SKIN FLAP  02/ 2015     [facility]   RIGHT CHEST WALL METASTATIC SKIN CARCINOMA  . RIGHT HEART Laura N/A 01/27/2018   Procedure: RIGHT HEART Laura;  Surgeon: Jolaine Artist, MD;  Location: [place] [facility];  Service: Cardiovascular;  Laterality: N/A;  . SKIN SPLIT GRAFT Right 01/29/2017   Procedure: SKIN GRAFT SPLIT THICKNESS TO RIGHT KNEE WOUND;  Surgeon: Wallace Going, DO;  [facility];  Service: Plastics;  Laterality: Right;  . SKIN SPLIT GRAFT Right 09/07/2017   Procedure: SKIN GRAFT SPLIT THICKNESS TO RIGHT KNEE WOUND WITH PLACEMENT OF VAC DRESSING TO GRAFT SITE;  Surgeon: Wallace Going, DO;  Location: [place];  Service: Plastics;  Laterality: Right;  . TOTAL KNEE ARTHROPLASTY Left 09/23/2012   Procedure: LEFT TOTAL KNEE ARTHROPLASTY;  Surgeon: Johnn Hai, MD;  [facility];  Service: Orthopedics;  Laterality: Left;  . TOTAL KNEE ARTHROPLASTY Right 03/20/2016    Procedure: RIGHT TOTAL KNEE ARTHROPLASTY;  Surgeon: Susa Day, MD;  [facility];  Service: Orthopedics;  Laterality: Right;  . TRANSTHORACIC ECHOCARDIOGRAM  04/24/2017   ef 75-91%, grade 1 diastolic dysfuction/  mild MR with mild prolapse anterior leaflet/ mild TR/ PASP 15mHg     Current Outpatient Medications  Medication Sig Dispense Refill  . acetaminophen (TYLENOL) 500 MG tablet Take 500 mg by mouth every 8 (eight) hours as needed for mild pain or headache.    . cetirizine (ZYRTEC) 10 MG tablet Take 1 tablet (10 mg total) daily by mouth. 30 tablet 11  . Cholecalciferol (VITAMIN D3) 2000 UNITS TABS Take 2,000 Units by mouth 4 (four) times  a week.     [name] dexamethasone (DECADRON) 4 MG tablet TAKE 2 TABLETS (8 MG TOTAL) BY MOUTH ONCE DAILY ON DAYS 2 & 3 FOLLOWING CHEMOTHERAPY    . doxycycline (MONODOX) 100 MG capsule Take 100 mg by mouth 2 (two) times daily.    [name] ELIQUIS 5 MG TABS tablet TAKE 1 TABLET BY MOUTH TWICE A DAY 60 tablet 4  . furosemide (LASIX) 20 MG tablet Take 20 mg by mouth daily.    [name] gabapentin (NEURONTIN) 300 MG capsule Take 300 mg by mouth 3 (three) times daily.    . mirabegron ER (MYRBETRIQ) 25 MG TB24 tablet Take 1 tablet (25 mg total) by mouth daily. 30 tablet 3  . ondansetron (ZOFRAN) 8 MG tablet TAKE 1 TABLET BY MOUTH EVERY 8 HOURS AS NEEDED FOR NAUSEA. 20 tablet 4  . polyethylene glycol powder (GLYCOLAX/MIRALAX) 17 GM/SCOOP powder Take 17 g by mouth 3 (three) times daily as needed. 3350 g 3  . sildenafil (REVATIO) 20 MG tablet Take 2 tablets (40 mg total) by mouth 3 (three) times daily. 180 tablet 11  . spironolactone (ALDACTONE) 25 MG tablet Take 0.5 tablets (12.5 mg total) by mouth daily. 15 tablet 3   No current facility-administered medications for this encounter.    Allergies:   Penicillins, Tape, and Other   Social History:  The patient  reports that she has never smoked. She has never used smokeless tobacco. She reports that she does not drink alcohol  or use drugs.   Family History:  The patient's family history includes Alcohol abuse in her paternal grandfather; Allergic rhinitis in her father and mother; Cancer in her mother; Heart disease in her mother; Migraines in her father; Parkinsonism in her father.   ROS:  Please see the history of present illness.   All other systems are personally reviewed and negative.   Exam:  (Video/Tele Health Call; Exam is subjective and or/visual.) General:  Speaks in full sentences. No resp difficulty. Lungs: Normal respiratory effort with conversation.  Abdomen: Non-distended per patient report Extremities: Bilateral LE edema R>L Neuro: Alert & oriented x 3.   Recent Labs: 07/05/2019: Hemoglobin 9.6; Platelets 212.0  Personally reviewed   Wt Readings from Last 3 Encounters:  06/15/19 89.6 kg (197 lb 9.6 oz)  02/03/19 90.4 kg (199 lb 6.4 oz)  01/06/19 90 kg (198 lb 6.4 oz)      ASSESSMENT AND PLAN:  1. Breast CA, stage IV with skin mets - HER 2-neu+ , ER/PR -, BRCA - - Recent progression of cancer - started famtratuzumab but complicated by pulmonary fibrosis. Now switching back to Kadcyla - Echo 1/21 EF 60-65%   2. PAH - Echo 4/20 60-65% GLS -15.0% (underestiamted) Mild to moderate TR RVSP 63  - Echo 1/21 EF 60-65% GLS -22.4% RV mildly dilated. Normal function  - CT chest No PE - VQ 5/19 No PE - [facility] CT chest 12/14/17 No comment on pulmonary artery opacification - Mod PAH with high output and pulmonary arteriopathy PA 67/28 (45) - She has had a seemingly good response to sildenafil. Continue sildenafil 40 TID. PA pressures stable on echo today  3. Volume overload with persistent hypokalemia - worse recently in setting of steroids.  - edema seems to be improved but not resolved. - continue lasix 40 daily with kdur  - can take extra 40 as needed. Continue compression hose - Will see back in office in several weeks. Can consider switch to torsemide  4. Pulmonary fibrosis -  chemo-induced - continue O2 and steroids per [facility]   COVID screen The patient does not have any symptoms that suggest any further testing/ screening at this time.  Social distancing reinforced today.  Recommended follow-up:  As above  Relevant cardiac medications were reviewed at length with the patient today.   The patient does not have concerns regarding their medications at this time.   The following changes were made today:  As above  Today, I have spent 15 minutes with the patient with telehealth technology discussing the above issues .    Signed, Glori Bickers, MD  08/22/2019 2:02 PM  [facility] [place] [address] [place] and [place] [address] [phone] (office) [phone] (fax)

## 2019-08-22 NOTE — Patient Instructions (Signed)
Our office will call you to schedule follow up in 4-6 weeks  If you have any questions or concerns before your next appointment please send [place] a message through [place] or call our office at [phone].  At the [facility], you and your health needs are our priority. As part of our continuing mission to provide you with exceptional heart care, we have created designated Provider Care Teams. These Care Teams include your primary Cardiologist (physician) and Advanced Practice Providers (APPs- Physician Assistants and Nurse Practitioners) who all work together to provide you with the care you need, when you need it.   You may see any of the following providers on your designated Care Team at your next follow up: [name] Dr Glori Bickers . Dr Loralie Champagne . Darrick Grinder, NP . Lyda Jester, PA . Audry Riles, PharmD   Please be sure to bring in all your medications bottles to every appointment.

## 2019-09-07 ENCOUNTER — Telehealth: Payer: Self-pay

## 2019-09-07 NOTE — Telephone Encounter (Signed)
Called pt back and she wanted to discuss her husbands pain medication. I advised that the 1st request was sent yesterday after Dr. Birdie Riddle had left for the afternoon since it was her 1/2 day. And the second was received today and routed to PCP. Also advised that pt husband had been advised in mychart and expressed an understanding.

## 2019-09-07 NOTE — Telephone Encounter (Signed)
Patient requesting a call back at [phone]. Patient refused to provide additional information

## 2019-09-07 NOTE — Telephone Encounter (Signed)
Noted  

## 2019-09-07 NOTE — Telephone Encounter (Signed)
Prescription will be filled now as I just finished seeing patients

## 2019-09-08 ENCOUNTER — Emergency Department (HOSPITAL_COMMUNITY): Payer: Medicare Other

## 2019-09-08 ENCOUNTER — Encounter (HOSPITAL_COMMUNITY): Payer: Self-pay | Admitting: Emergency Medicine

## 2019-09-08 ENCOUNTER — Inpatient Hospital Stay (HOSPITAL_COMMUNITY)
Admission: EM | Admit: 2019-09-08 | Discharge: 2019-09-09 | DRG: 291 | Disposition: A | Discharge status: Other Acute Inpatient Hospital | Payer: Medicare Other | Attending: Internal Medicine | Admitting: Internal Medicine

## 2019-09-08 ENCOUNTER — Other Ambulatory Visit: Payer: Self-pay

## 2019-09-08 DIAGNOSIS — J9601 Acute respiratory failure with hypoxia: Secondary | ICD-10-CM | POA: Diagnosis not present

## 2019-09-08 DIAGNOSIS — Z8249 Family history of ischemic heart disease and other diseases of the circulatory system: Secondary | ICD-10-CM

## 2019-09-08 DIAGNOSIS — Z96653 Presence of artificial knee joint, bilateral: Secondary | ICD-10-CM | POA: Diagnosis present

## 2019-09-08 DIAGNOSIS — K761 Chronic passive congestion of liver: Secondary | ICD-10-CM | POA: Diagnosis present

## 2019-09-08 DIAGNOSIS — E861 Hypovolemia: Secondary | ICD-10-CM | POA: Diagnosis present

## 2019-09-08 DIAGNOSIS — I749 Embolism and thrombosis of unspecified artery: Secondary | ICD-10-CM

## 2019-09-08 DIAGNOSIS — D630 Anemia in neoplastic disease: Secondary | ICD-10-CM | POA: Diagnosis present

## 2019-09-08 DIAGNOSIS — J9621 Acute and chronic respiratory failure with hypoxia: Secondary | ICD-10-CM | POA: Diagnosis not present

## 2019-09-08 DIAGNOSIS — Z809 Family history of malignant neoplasm, unspecified: Secondary | ICD-10-CM

## 2019-09-08 DIAGNOSIS — Z853 Personal history of malignant neoplasm of breast: Secondary | ICD-10-CM

## 2019-09-08 DIAGNOSIS — Z79899 Other long term (current) drug therapy: Secondary | ICD-10-CM

## 2019-09-08 DIAGNOSIS — Z820 Family history of epilepsy and other diseases of the nervous system: Secondary | ICD-10-CM

## 2019-09-08 DIAGNOSIS — I272 Pulmonary hypertension, unspecified: Secondary | ICD-10-CM | POA: Diagnosis present

## 2019-09-08 DIAGNOSIS — R000 Tachycardia, unspecified: Secondary | ICD-10-CM | POA: Diagnosis present

## 2019-09-08 DIAGNOSIS — R739 Hyperglycemia, unspecified: Secondary | ICD-10-CM | POA: Diagnosis present

## 2019-09-08 DIAGNOSIS — I50813 Acute on chronic right heart failure: Principal | ICD-10-CM | POA: Diagnosis present

## 2019-09-08 DIAGNOSIS — I50811 Acute right heart failure: Secondary | ICD-10-CM | POA: Diagnosis present

## 2019-09-08 DIAGNOSIS — Z9013 Acquired absence of bilateral breasts and nipples: Secondary | ICD-10-CM

## 2019-09-08 DIAGNOSIS — E876 Hypokalemia: Secondary | ICD-10-CM | POA: Diagnosis present

## 2019-09-08 DIAGNOSIS — Z923 Personal history of irradiation: Secondary | ICD-10-CM

## 2019-09-08 DIAGNOSIS — T451X5A Adverse effect of antineoplastic and immunosuppressive drugs, initial encounter: Secondary | ICD-10-CM | POA: Diagnosis present

## 2019-09-08 DIAGNOSIS — D6959 Other secondary thrombocytopenia: Secondary | ICD-10-CM | POA: Diagnosis present

## 2019-09-08 DIAGNOSIS — Z20822 Contact with and (suspected) exposure to covid-19: Secondary | ICD-10-CM | POA: Diagnosis present

## 2019-09-08 DIAGNOSIS — D849 Immunodeficiency, unspecified: Secondary | ICD-10-CM | POA: Diagnosis present

## 2019-09-08 DIAGNOSIS — C792 Secondary malignant neoplasm of skin: Secondary | ICD-10-CM | POA: Diagnosis present

## 2019-09-08 DIAGNOSIS — J8410 Pulmonary fibrosis, unspecified: Secondary | ICD-10-CM | POA: Diagnosis present

## 2019-09-08 DIAGNOSIS — J189 Pneumonia, unspecified organism: Secondary | ICD-10-CM | POA: Diagnosis present

## 2019-09-08 DIAGNOSIS — Z95828 Presence of other vascular implants and grafts: Secondary | ICD-10-CM

## 2019-09-08 DIAGNOSIS — Y95 Nosocomial condition: Secondary | ICD-10-CM | POA: Diagnosis present

## 2019-09-08 DIAGNOSIS — J470 Bronchiectasis with acute lower respiratory infection: Secondary | ICD-10-CM | POA: Diagnosis present

## 2019-09-08 DIAGNOSIS — G620 Drug-induced polyneuropathy: Secondary | ICD-10-CM | POA: Diagnosis present

## 2019-09-08 DIAGNOSIS — J849 Interstitial pulmonary disease, unspecified: Secondary | ICD-10-CM | POA: Diagnosis present

## 2019-09-08 DIAGNOSIS — Z86718 Personal history of other venous thrombosis and embolism: Secondary | ICD-10-CM

## 2019-09-08 DIAGNOSIS — Z7901 Long term (current) use of anticoagulants: Secondary | ICD-10-CM

## 2019-09-08 LAB — DIFFERENTIAL
Abs Immature Granulocytes: 0 10*3/uL (ref 0.00–0.07)
Basophils Absolute: 0 10*3/uL (ref 0.0–0.1)
Basophils Relative: 0 %
Eosinophils Absolute: 0 10*3/uL (ref 0.0–0.5)
Eosinophils Relative: 0 %
Lymphocytes Relative: 4 %
Lymphs Abs: 0.3 10*3/uL — ABNORMAL LOW (ref 0.7–4.0)
Monocytes Absolute: 0.2 10*3/uL (ref 0.1–1.0)
Monocytes Relative: 3 %
Neutro Abs: 6.8 10*3/uL (ref 1.7–7.7)
Neutrophils Relative %: 93 %
nRBC: 0 /100 WBC

## 2019-09-08 LAB — CBC
HCT: 33.8 % — ABNORMAL LOW (ref 36.0–46.0)
HCT: 33.4 % — ABNORMAL LOW (ref 36.0–46.0)
Hemoglobin: 11 g/dL — ABNORMAL LOW (ref 12.0–15.0)
Hemoglobin: 11.1 g/dL — ABNORMAL LOW (ref 12.0–15.0)
MCH: 38.7 pg — ABNORMAL HIGH (ref 26.0–34.0)
MCH: 39.5 pg — ABNORMAL HIGH (ref 26.0–34.0)
MCHC: 32.5 g/dL (ref 30.0–36.0)
MCHC: 33.2 g/dL (ref 30.0–36.0)
MCV: 119 fL — ABNORMAL HIGH (ref 80.0–100.0)
MCV: 118.9 fL — ABNORMAL HIGH (ref 80.0–100.0)
Platelets: 137 10*3/uL — ABNORMAL LOW (ref 150–400)
Platelets: 123 10*3/uL — ABNORMAL LOW (ref 150–400)
RBC: 2.84 MIL/uL — ABNORMAL LOW (ref 3.87–5.11)
RBC: 2.81 MIL/uL — ABNORMAL LOW (ref 3.87–5.11)
RDW: 20 % — ABNORMAL HIGH (ref 11.5–15.5)
RDW: 19.9 % — ABNORMAL HIGH (ref 11.5–15.5)
WBC: 8.1 10*3/uL (ref 4.0–10.5)
WBC: 7.3 10*3/uL (ref 4.0–10.5)
nRBC: 0.2 % (ref 0.0–0.2)
nRBC: 0 % (ref 0.0–0.2)

## 2019-09-08 LAB — HEPATIC FUNCTION PANEL
ALT: 48 U/L — ABNORMAL HIGH (ref 0–44)
AST: 41 U/L (ref 15–41)
Albumin: 2.4 g/dL — ABNORMAL LOW (ref 3.5–5.0)
Alkaline Phosphatase: 183 U/L — ABNORMAL HIGH (ref 38–126)
Bilirubin, Direct: 0.6 mg/dL — ABNORMAL HIGH (ref 0.0–0.2)
Indirect Bilirubin: 1.8 mg/dL — ABNORMAL HIGH (ref 0.3–0.9)
Total Bilirubin: 2.4 mg/dL — ABNORMAL HIGH (ref 0.3–1.2)
Total Protein: 5.1 g/dL — ABNORMAL LOW (ref 6.5–8.1)

## 2019-09-08 LAB — BASIC METABOLIC PANEL
Anion gap: 16 — ABNORMAL HIGH (ref 5–15)
BUN: 22 mg/dL (ref 8–23)
CO2: 26 mmol/L (ref 22–32)
Calcium: 8.4 mg/dL — ABNORMAL LOW (ref 8.9–10.3)
Chloride: 91 mmol/L — ABNORMAL LOW (ref 98–111)
Creatinine, Ser: 0.91 mg/dL (ref 0.44–1.00)
GFR calc Af Amer: >60 mL/min (ref >60)
GFR calc non Af Amer: >60 mL/min (ref >60)
Glucose, Bld: 252 mg/dL — ABNORMAL HIGH (ref 70–99)
Potassium: 3.5 mmol/L (ref 3.5–5.1)
Sodium: 133 mmol/L — ABNORMAL LOW (ref 135–145)

## 2019-09-08 LAB — RESPIRATORY PANEL BY RT PCR (FLU A&B, COVID)
Influenza A by PCR: NEGATIVE
Influenza B by PCR: NEGATIVE
SARS Coronavirus 2 by RT PCR: NEGATIVE

## 2019-09-08 LAB — LACTIC ACID, PLASMA
Lactic Acid, Venous: 3.4 mmol/L (ref 0.5–1.9)
Lactic Acid, Venous: 2 mmol/L (ref 0.5–1.9)

## 2019-09-08 LAB — TROPONIN I (HIGH SENSITIVITY)
Troponin I (High Sensitivity): 105 ng/L (ref <18)
Troponin I (High Sensitivity): 111 ng/L (ref <18)

## 2019-09-08 LAB — BRAIN NATRIURETIC PEPTIDE: B Natriuretic Peptide: 761.6 pg/mL — ABNORMAL HIGH (ref 0.0–100.0)

## 2019-09-08 MED ORDER — GABAPENTIN 300 MG PO CAPS
300.0000 mg | ORAL_CAPSULE | Freq: Three times a day (TID) | ORAL | Status: DC
  Administered 2019-09-09 (×3): 300 mg via ORAL
  Filled 2019-09-08: qty 1
  Filled 2019-09-08 (×2): qty 3

## 2019-09-08 MED ORDER — FUROSEMIDE 10 MG/ML IJ SOLN
40.0000 mg | Freq: Two times a day (BID) | INTRAMUSCULAR | Status: DC
  Administered 2019-09-09: 40 mg via INTRAVENOUS
  Filled 2019-09-08: qty 4

## 2019-09-08 MED ORDER — CHLORHEXIDINE GLUCONATE CLOTH 2 % EX PADS
6.0000 | MEDICATED_PAD | Freq: Every day | CUTANEOUS | Status: DC
  Administered 2019-09-09: 6 via TOPICAL

## 2019-09-08 MED ORDER — SILDENAFIL CITRATE 20 MG PO TABS
40.0000 mg | ORAL_TABLET | Freq: Three times a day (TID) | ORAL | Status: DC
  Administered 2019-09-09 (×3): 40 mg via ORAL
  Filled 2019-09-08 (×4): qty 2

## 2019-09-08 MED ORDER — SODIUM CHLORIDE 0.9% FLUSH
3.0000 mL | Freq: Two times a day (BID) | INTRAVENOUS | Status: DC
  Administered 2019-09-09 (×2): 3 mL via INTRAVENOUS

## 2019-09-08 MED ORDER — SODIUM CHLORIDE 0.9% FLUSH: 10.0000 mL | INTRAVENOUS | Status: DC | PRN

## 2019-09-08 MED ORDER — POTASSIUM CHLORIDE CRYS ER 20 MEQ PO TBCR
40.0000 meq | EXTENDED_RELEASE_TABLET | Freq: Every day | ORAL | Status: DC
  Administered 2019-09-09: 40 meq via ORAL
  Filled 2019-09-08: qty 2

## 2019-09-08 MED ORDER — METHYLPREDNISOLONE SODIUM SUCC 40 MG IJ SOLR
40.0000 mg | Freq: Two times a day (BID) | INTRAMUSCULAR | Status: DC
  Administered 2019-09-09: 40 mg via INTRAVENOUS
  Filled 2019-09-08: qty 1

## 2019-09-08 MED ORDER — SODIUM CHLORIDE 0.9 % IV BOLUS
250.0000 mL | Freq: Once | INTRAVENOUS | Status: AC
  Administered 2019-09-08: 250 mL via INTRAVENOUS

## 2019-09-08 MED ORDER — ACETAMINOPHEN 650 MG RE SUPP: 650.0000 mg | Freq: Four times a day (QID) | RECTAL | Status: DC | PRN

## 2019-09-08 MED ORDER — IOHEXOL 350 MG/ML SOLN
100.0000 mL | Freq: Once | INTRAVENOUS | Status: AC | PRN
  Administered 2019-09-08: 100 mL via INTRAVENOUS

## 2019-09-08 MED ORDER — SODIUM CHLORIDE 0.9 % IV SOLN
2.0000 g | Freq: Three times a day (TID) | INTRAVENOUS | Status: DC
  Administered 2019-09-08: 2 g via INTRAVENOUS
  Filled 2019-09-08: qty 2

## 2019-09-08 MED ORDER — ONDANSETRON HCL 4 MG/2ML IJ SOLN: 4.0000 mg | Freq: Four times a day (QID) | INTRAMUSCULAR | Status: DC | PRN

## 2019-09-08 MED ORDER — AEROCHAMBER PLUS FLO-VU MEDIUM MISC: 1.0000 | Freq: Once | Status: DC

## 2019-09-08 MED ORDER — PANTOPRAZOLE SODIUM 40 MG PO TBEC
40.0000 mg | DELAYED_RELEASE_TABLET | Freq: Every day | ORAL | Status: DC
  Administered 2019-09-09: 40 mg via ORAL
  Filled 2019-09-08: qty 1

## 2019-09-08 MED ORDER — ALBUTEROL SULFATE (2.5 MG/3ML) 0.083% IN NEBU
2.5000 mg | INHALATION_SOLUTION | RESPIRATORY_TRACT | Status: DC | PRN
  Administered 2019-09-09: 2.5 mg via RESPIRATORY_TRACT
  Filled 2019-09-08: qty 3

## 2019-09-08 MED ORDER — ALBUTEROL SULFATE (2.5 MG/3ML) 0.083% IN NEBU
5.0000 mg | INHALATION_SOLUTION | Freq: Once | RESPIRATORY_TRACT | Status: AC
  Administered 2019-09-08: 5 mg via RESPIRATORY_TRACT
  Filled 2019-09-08: qty 6

## 2019-09-08 MED ORDER — VANCOMYCIN HCL 1750 MG/350ML IV SOLN
1750.0000 mg | INTRAVENOUS | Status: DC
  Administered 2019-09-08: 1750 mg via INTRAVENOUS
  Filled 2019-09-08: qty 350

## 2019-09-08 MED ORDER — METHYLPREDNISOLONE SODIUM SUCC 125 MG IJ SOLR
125.0000 mg | Freq: Once | INTRAMUSCULAR | Status: AC
  Administered 2019-09-08: 125 mg via INTRAVENOUS
  Filled 2019-09-08: qty 2

## 2019-09-08 MED ORDER — HEPARIN SODIUM (PORCINE) 5000 UNIT/ML IJ SOLN
5000.0000 [IU] | Freq: Three times a day (TID) | INTRAMUSCULAR | Status: DC
  Administered 2019-09-09 (×3): 5000 [IU] via SUBCUTANEOUS
  Filled 2019-09-08 (×3): qty 1

## 2019-09-08 MED ORDER — ONDANSETRON HCL 4 MG PO TABS: 4.0000 mg | ORAL_TABLET | Freq: Four times a day (QID) | ORAL | Status: DC | PRN

## 2019-09-08 MED ORDER — POLYETHYLENE GLYCOL 3350 17 G PO PACK
17.0000 g | PACK | Freq: Three times a day (TID) | ORAL | Status: DC | PRN
  Administered 2019-09-09: 17 g via ORAL
  Filled 2019-09-08: qty 1

## 2019-09-08 MED ORDER — ALBUTEROL SULFATE HFA 108 (90 BASE) MCG/ACT IN AERS
2.0000 | INHALATION_SPRAY | Freq: Once | RESPIRATORY_TRACT | Status: AC
  Administered 2019-09-08: 2 via RESPIRATORY_TRACT
  Filled 2019-09-08: qty 6.7

## 2019-09-08 MED ORDER — FUROSEMIDE 10 MG/ML IJ SOLN
40.0000 mg | Freq: Once | INTRAMUSCULAR | Status: AC
  Administered 2019-09-08: 40 mg via INTRAVENOUS
  Filled 2019-09-08: qty 4

## 2019-09-08 MED ORDER — ALBUTEROL SULFATE HFA 108 (90 BASE) MCG/ACT IN AERS: 2.0000 | INHALATION_SPRAY | Freq: Once | RESPIRATORY_TRACT | Status: DC

## 2019-09-08 MED ORDER — MIRABEGRON ER 25 MG PO TB24
25.0000 mg | ORAL_TABLET | Freq: Every day | ORAL | Status: DC
  Administered 2019-09-09: 25 mg via ORAL
  Filled 2019-09-08: qty 1

## 2019-09-08 MED ORDER — ASPIRIN 81 MG PO CHEW
324.0000 mg | CHEWABLE_TABLET | Freq: Once | ORAL | Status: AC
  Administered 2019-09-08: 324 mg via ORAL
  Filled 2019-09-08: qty 4

## 2019-09-08 MED ORDER — ACETAMINOPHEN 325 MG PO TABS: 650.0000 mg | ORAL_TABLET | Freq: Four times a day (QID) | ORAL | Status: DC | PRN

## 2019-09-08 NOTE — H&P (Signed)
History and Physical    [name] GYF:[id number] DOB: 02/24/1956 DOA: 09/08/2019  PCP: Midge Minium, MD  Patient coming from: Home via EMS  I have personally briefly reviewed patient's old medical records in [place]  Chief Complaint: Shortness of breath  HPI: [name] is a 64 y.o. female with medical history significant for metastatic breast cancer s/p bilateral mastectomy, trastuzumab deruxtecan induced ILD, pulmonary hypertension, and thromboembolic disorder who presents to the ED for evaluation of shortness of breath.  Patient was recently hospitalized at [facility] from 08/26/2019-09/04/2019 for significant volume overload/anasarca felt secondary to progression of pulmonary hypertension.  He was diuresed in hospital and discharged on oral torsemide 20 mg daily and recommended to continue home sildenafil 40 mg 3 times daily.  She was noted to be thrombocytopenic felt due to chemotherapy and congestive hepatopathy in setting of fluid overload.  Her Eliquis was held with improvement in platelet count.  She is recommended to continue to hold her Eliquis until she follows up with her outpatient oncologist on timing of resumption due to reason for being on anticoagulation was a remote port associated thrombus in 2010.  Additional medications on discharge included 5-day course of cefdinir and a 3-week prednisone taper.  Patient states she was doing well initially at home after discharge from the hospital.  She says she did actually have a 9 pound weight loss since leaving the hospital but has been having progressive shortness of breath with nonproductive cough.  She has noticed increased swelling in both of her legs.  She states she has been on 2 L supplemental O2 via [place] chronically with increased requirement up to 4 L since she left [facility]  She had increased her home oxygen to maximum 5 L at home and was still seen low O2 saturation in the 70s therefore she called 911  and was brought to the hospital for further evaluation and management.  ED Course:  Initial vitals showed BP 116/62, pulse 119, RR 30, temp 98.2 Fahrenheit, SPO2 96% on 12 L NRB.  Labs are notable for WBC 8.1, hemoglobin 11.0, platelets 137,000, sodium 133, potassium 3.5, bicarb 26, BUN 22, creatinine 0.91, serum glucose 252, BNP 761.6, lactic acid 3.4, high-sensitivity troponin I 105 and 111.  AST 41, ALT 48, alk phos 183, total bilirubin 2.4.  Repeat lactic acid is 2.0.  Blood cultures were obtained and pending.  SARS-CoV-2 PCR is negative, influenza A/B PCR's are negative.  Patient was given IV Lasix 40 mg once, IV vancomycin/cefepime, aspirin 324 mg once, IV Solu-Medrol 125 mg once, and is to 50 mL, and albuterol inhaler and nebulizer treatments.  Patient was initially plan to transfer to [facility] however no beds are available.  The hospitalist service was consulted to admit for further evaluation management.  Review of Systems: All systems reviewed and are negative except as documented in history of present illness above.   Past Medical History:  Diagnosis Date  . Arthritis   . Breast cancer metastasized to skin, right [facility]) followed by dr force -- oncolgoist w/ [place]   primary left breast cancer dx 01/ 2009 ; 11/ 2009 recurrence left chest wall and neck, tx clinical trial drug and chemotherapy;  2015 recurrence cutaneous metastatized to right skin/ chest wall, 02/ 2015 resection chest wall disease and 02-23-2015 s/p skin flap surgery,  chemo every 3 weeks  . Chronic anticoagulation due to thromboembolic disorder   91/ 6384 - PAC clot--- ;  currently lovenox or eliquis  . Heart murmur   . 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 MVP (mitral valve prolapse)    mild mvp w/ mild regurg. per last echo 04-24-2017  . Neuropathy due to chemotherapeutic drug ([place])   . Non-healing surgical wound    right knee post re-implantation total knee arthroplasty 04-2017 unable to completely close surgical incision  . PONV (postoperative nausea and vomiting)    ponv likes scopolamine patch  . Port-A-Cath in place    RIGHT CHEST   . Red blood cell antibody positive   . Skin changes related to chemotherapy    per patient she has scabbed over skin circumventing port to right upper chest ;  state it is due to chemptherapy   . 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 APPLICATION OF A-CELL OF BACK Right 07/31/2016   Procedure: CELLERATE COLLAGEN PLACEMENT;  Surgeon: Loel Lofty Dillingham, DO;  Location: [place];  Service: Plastics;  Laterality: Right;  . APPLICATION OF WOUND VAC Right 06/29/2017   Procedure: APPLICATION OF WOUND VAC;  Surgeon: Wallace Going, DO;  [facility];  Service: Plastics;  Laterality: Right;  . DEBRIDEMENT CHEST WALL RIGHT/ VAC PLACEMENT  02-09-2015    [facility]  . EXCISIONAL TOTAL KNEE ARTHROPLASTY WITH ANTIBIOTIC SPACERS Right 12/02/2016   Procedure: EXCISIONAL TOTAL KNEE ARTHROPLASTY WITH ANTIBIOTIC SPACERS;  Surgeon: Paralee Cancel, MD;  [facility];  Service: Orthopedics;  Laterality: Right;  90 mins  . HEMATOMA EVACUATION Right 06/29/2017   Procedure: EVACUATION HEMATOMA OF RIGHT KNEE;  Surgeon: Wallace Going, DO;  [facility];  Service: Plastics;  Laterality: Right;  . HEMATOMA EVACUATION Right 06/29/2017   Procedure: EVACUATION RIGHT TOTAL KNEE HEMATOMA;  Surgeon: Paralee Cancel, MD;  [facility];  Service: Orthopedics;  Laterality: Right;  . HEMATOMA EVACUATION Right 07/14/2017    Procedure: EVACUATION RIGHT LEG HEMATOMA WITH APPLICATION OF WOUND VAC;  Surgeon: Paralee Cancel, MD;  [facility];  Service: Orthopedics;  Laterality: Right;  . hemotoma     evacuation left chest wall  . I & D EXTREMITY Right 07/17/2017   Procedure: EVACUATION RIGHT LEG HEMATOMA WITH WOUND VAC DRESSING CHANGE;  Surgeon: Paralee Cancel, MD;  [facility];  Service: Orthopedics;  Laterality: Right;  . I & D KNEE WITH POLY EXCHANGE Right 05/28/2016   Procedure: IRRIGATION AND DEBRIDEMENT KNEE WOUND VAC PLACMENT;  Surgeon: Susa Day, MD;  [facility];  Service: Orthopedics;  Laterality: Right;  . I & D KNEE WITH POLY EXCHANGE Right 05/30/2016   Procedure: RADICAL SYNOVECTOMY,IRRIGATION AND DEBRIDEMENT KNEE WITH POLY EXCHANGE WITH ANTIBIOTIC BEADS, APPLICATION OF WOUND VAC;  Surgeon: Susa Day, MD;  [facility];  Service: Orthopedics;  Laterality: Right;  . INCISION AND DRAINAGE OF WOUND Right 07/31/2016   Procedure: IRRIGATION AND DEBRIDEMENT RIGHT KNEE  WOUND;  Surgeon: Loel Lofty Dillingham, DO;  Location: [place];  Service: Plastics;  Laterality: Right;  . IR FLUORO GUIDE CV LINE LEFT  04/30/2017  . IR  US GUIDE VASC ACCESS LEFT  04/30/2017  . IRRIGATION AND DEBRIDEMENT KNEE Right 04/12/2016   Procedure: IRRIGATION AND DEBRIDEMENT KNEE;  Surgeon: Nicholes Stairs, MD;  [facility];  Service: Orthopedics;  Laterality: Right;  . IRRIGATION AND DEBRIDEMENT KNEE Right 12/16/2016   Procedure: Repeat irrigation and debridement right knee, wound closure  wound vac REPLACEMENT ANTIBIOTIC SPACERS;  Surgeon: Paralee Cancel, MD;  [facility];  Service: Orthopedics;  Laterality: Right;  . KNEE ARTHROSCOPY  02/13/2012   Procedure: ARTHROSCOPY KNEE;  Surgeon: Johnn Hai, MD;  [facility];  Service: Orthopedics;  Laterality: Left;  WITH DEBRIDEMENt   . KNEE ARTHROSCOPY WITH LATERAL MENISECTOMY  02/13/2012   Procedure: KNEE ARTHROSCOPY WITH LATERAL MENISECTOMY;   Surgeon: Johnn Hai, MD;  Location: [facility];  Service: Orthopedics;;  partial  . LATISSIMUS FLAP RIGHT CHEST  02-23-2015   [facility]  . LEFT MODIFIED RADICAL MASTECTOMY/ RIGHT TOTAL MASTECTOMY  11-13-2007   LEFT BREAST CANCER W/ AXILLARY LYMPH NODE METASTASIS AND POST NEOADJUVANT CHEMO  . MASS EXCISION Right 06/29/2017   Procedure: EXCISION OF METASTATIC BREAST CANCER TO SKIN RIGHT SHOULDER, PLACEMENT OF CELLERATE;  Surgeon: Wallace Going, DO;  [facility];  Service: Plastics;  Laterality: Right;  . PLACEMENT PORT-A-CATH  06/22/2007    new port placed 2015, right chest  . REIMPLANTATION OF TOTAL KNEE Right 04/27/2017   Procedure: Reimplantation of right total knee arthroplasty;  Surgeon: Paralee Cancel, MD;  [facility];  Service: Orthopedics;  Laterality: Right;  Adductor Block  . RESECTION OF ISOLATED CHEST WALL DISEASE/ ABDOMINAL SKIN FLAP  02/ 2015     [facility]   RIGHT CHEST WALL METASTATIC SKIN CARCINOMA  . RIGHT HEART CATH N/A 01/27/2018   Procedure: RIGHT HEART CATH;  Surgeon: Jolaine Artist, MD;  Location: [place] [facility];  Service: Cardiovascular;  Laterality: N/A;  . SKIN SPLIT GRAFT Right 01/29/2017   Procedure: SKIN GRAFT SPLIT THICKNESS TO RIGHT KNEE WOUND;  Surgeon: Wallace Going, DO;  [facility];  Service: Plastics;  Laterality: Right;  . SKIN SPLIT GRAFT Right 09/07/2017   Procedure: SKIN GRAFT SPLIT THICKNESS TO RIGHT KNEE WOUND WITH PLACEMENT OF VAC DRESSING TO GRAFT SITE;  Surgeon: Wallace Going, DO;  Location: [place];  Service: Plastics;  Laterality: Right;  . TOTAL KNEE ARTHROPLASTY Left 09/23/2012   Procedure: LEFT TOTAL KNEE ARTHROPLASTY;  Surgeon: Johnn Hai, MD;  [facility];  Service: Orthopedics;  Laterality: Left;  . TOTAL KNEE ARTHROPLASTY Right 03/20/2016   Procedure: RIGHT TOTAL KNEE ARTHROPLASTY;  Surgeon: Susa Day, MD;  [facility];  Service: Orthopedics;  Laterality: Right;   . TRANSTHORACIC ECHOCARDIOGRAM  04/24/2017   ef 82-50%, grade 1 diastolic dysfuction/  mild MR with mild prolapse anterior leaflet/ mild TR/ PASP 86mHg    Social History:  reports that she has never smoked. She has never used smokeless tobacco. She reports that she does not drink alcohol or use drugs.  Allergies  Allergen Reactions  . Penicillins Hives, Itching, Rash and Other (See Comments)    Patient has previously tolerated cefazolin, ceftriaxone, and cefepime  PATIENT HAS HAD A PCN REACTION WITH IMMEDIATE RASH, FACIAL/TONGUE/THROAT SWELLING, SOB, OR LIGHTHEADEDNESS WITH HYPOTENSION:  #  #  YES  #  #  Has patient had a PCN reaction causing severe rash involving mucus membranes or skin necrosis: No Has patient had a PCN reaction that required hospitalization No  Has patient had a PCN reaction occurring within the last 10 years: yes Denies airway involvement   . Tape Hives and Other (See Comments)    Can tolerate paper and adhesive,  NO MEDIPORE TAPE  . Other Rash and Other (See Comments)    STERI STRIPS - Blisters    Family History  Problem Relation Age of Onset  . Heart disease Mother        due to mitral valve regurgiation,   . Cancer Mother        breast  . Allergic rhinitis Mother   . Parkinsonism Father   . Allergic rhinitis Father   . Migraines Father   . Alcohol abuse Paternal Grandfather   . Angioedema Neg Hx   . Asthma Neg Hx   . Eczema Neg Hx      Prior to Admission medications   Medication Sig Start Date End Date Taking? Authorizing Provider  acetaminophen (TYLENOL) 500 MG tablet Take 500 mg by mouth every 8 (eight) hours as needed for mild pain or headache.   Yes [provider]  cefdinir (OMNICEF) 300 MG capsule Take 300 mg by mouth every 12 (twelve) hours. 09/04/19 09/09/19 Yes [provider]  celecoxib (CELEBREX) 200 MG capsule Take 200 mg by mouth daily as needed for mild pain.  02/19/15  Yes [provider]  cetirizine  (ZYRTEC) 10 MG tablet Take 1 tablet (10 mg total) daily by mouth. 04/03/17  Yes Midge Minium, MD  Cholecalciferol (VITAMIN D3) 2000 UNITS TABS Take 2,000 Units by mouth 4 (four) times a week.    Yes [provider]  doxycycline (MONODOX) 100 MG capsule Take 100 mg by mouth 2 (two) times daily. 08/11/18  Yes [provider]  gabapentin (NEURONTIN) 300 MG capsule Take 300 mg by mouth 3 (three) times daily.   Yes [provider]  mirabegron ER (MYRBETRIQ) 25 MG TB24 tablet Take 1 tablet (25 mg total) by mouth daily. 02/08/18  Yes Midge Minium, MD  omeprazole (PRILOSEC) 20 MG capsule Take 20 mg by mouth daily.   Yes [provider]  ondansetron (ZOFRAN) 8 MG tablet TAKE 1 TABLET BY MOUTH EVERY 8 HOURS AS NEEDED FOR NAUSEA. Patient taking differently: Take 8 mg by mouth every 8 (eight) hours as needed for nausea.  11/08/18  Yes Midge Minium, MD  polyethylene glycol powder (GLYCOLAX/MIRALAX) 17 GM/SCOOP powder Take 17 g by mouth 3 (three) times daily as needed. Patient taking differently: Take 17 g by mouth 3 (three) times daily as needed for mild constipation.  08/18/19  Yes Midge Minium, MD  potassium chloride SA (KLOR-CON) 20 MEQ tablet Take 2 tablets by mouth daily. 09/03/19 09/02/20 Yes [provider]  prednisoLONE acetate (PRED FORTE) 1 % ophthalmic suspension Place 1 drop into the left eye in the morning and at bedtime. Please begin medication after scheduled procedure.  08/15/19  Yes [provider]  sildenafil (REVATIO) 20 MG tablet Take 2 tablets (40 mg total) by mouth 3 (three) times daily. 08/04/19  Yes Bensimhon, Shaune Pascal, MD  torsemide (DEMADEX) 20 MG tablet Take 20 mg by mouth daily. Take additional dose of Torsemide if weight increases by 1-2Ib in a day or 5Ibs in a week. 09/03/19 09/02/20 Yes [provider]  dexamethasone (DECADRON) 4 MG tablet TAKE 2 TABLETS (8 MG TOTAL) BY MOUTH ONCE DAILY ON DAYS 2 & 3  FOLLOWING CHEMOTHERAPY 12/31/18   [provider]  ELIQUIS 5 MG TABS  tablet TAKE 1 TABLET BY MOUTH TWICE A DAY Patient taking differently: Take 5 mg by mouth 2 (two) times daily.  04/11/19   Midge Minium, MD  spironolactone (ALDACTONE) 25 MG tablet Take 0.5 tablets (12.5 mg total) by mouth daily. 06/15/19   Bensimhon, Shaune Pascal, MD    Physical Exam: Vitals:   09/08/19 2115 09/08/19 2130 09/08/19 2230 09/08/19 2300  BP: 124/65 107/63 (!) 109/57 99/60  Pulse: (!) 126 (!) 122 (!) 115 (!) 112  Resp: (!) 33 (!) '25 18 18  '$ Temp:      TempSrc:      SpO2: 96% 96% 94% 92%  Weight:      Height:       Constitutional: Elderly woman resting in bed with head elevated while on nonrebreather, NAD, calm, comfortable Eyes: PERRL, lids and conjunctivae normal ENMT: Mucous membranes are moist. Posterior pharynx clear of any exudate or lesions. Neck: normal, supple, no masses. Respiratory: Fine inspiratory crackles diffuse lung fields.  Normal respiratory effort. No accessory muscle use.  Cardiovascular: Regular rate and rhythm, no murmurs / rubs / gallops.  +2 bilateral lower extremity edema.  Port-A-Cath in place right chest wall without associated erythema, warmth, or tenderness to touch. Abdomen: no tenderness, no masses palpated. No hepatosplenomegaly. Bowel sounds positive.  Musculoskeletal: no clubbing / cyanosis. No joint deformity upper and lower extremities. Good ROM, no contractures. Normal muscle tone.  Skin: Extensive bruising bilateral lower extremities and upper extremities. Neurologic: CN 2-12 grossly intact. Sensation intact, Strength equal all extremities. Psychiatric: Normal judgment and insight. Alert and oriented x 3. Normal mood.   Labs on Admission: I have personally reviewed following labs and imaging studies  CBC: Recent Labs  Lab 09/08/19 1410 09/08/19 1443  WBC 8.1 7.3  NEUTROABS  --  6.8  HGB 11.0* 11.1*  HCT 33.8* 33.4*  MCV 119.0* 118.9*  PLT 137* 123*    Basic Metabolic Panel: Recent Labs  Lab 09/08/19 1410  NA 133*  K 3.5  CL 91*  CO2 26  GLUCOSE 252*  BUN 22  CREATININE 0.91  CALCIUM 8.4*   GFR: Estimated Creatinine Clearance: 71.5 mL/min (by C-G formula based on SCr of 0.91 mg/dL). Liver Function Tests: Recent Labs  Lab 09/08/19 1648  AST 41  ALT 48*  ALKPHOS 183*  BILITOT 2.4*  PROT 5.1*  ALBUMIN 2.4*   No results for input(s): LIPASE, AMYLASE in the last 168 hours. No results for input(s): AMMONIA in the last 168 hours. Coagulation Profile: No results for input(s): INR, PROTIME in the last 168 hours. Cardiac Enzymes: No results for input(s): CKTOTAL, CKMB, CKMBINDEX, TROPONINI in the last 168 hours. BNP (last 3 results) No results for input(s): PROBNP in the last 8760 hours. HbA1C: No results for input(s): HGBA1C in the last 72 hours. CBG: No results for input(s): GLUCAP in the last 168 hours. Lipid Profile: No results for input(s): CHOL, HDL, LDLCALC, TRIG, CHOLHDL, LDLDIRECT in the last 72 hours. Thyroid Function Tests: No results for input(s): TSH, T4TOTAL, FREET4, T3FREE, THYROIDAB in the last 72 hours. Anemia Panel: No results for input(s): VITAMINB12, FOLATE, FERRITIN, TIBC, IRON, RETICCTPCT in the last 72 hours. Urine analysis:    Component Value Date/Time   COLORURINE YELLOW 08/20/2018 1416   APPEARANCEUR CLEAR 08/20/2018 1416   LABSPEC 1.023 08/20/2018 1416   LABSPEC 1.005 02/17/2008 1122   PHURINE 5.0 08/20/2018 1416   [place] 08/20/2018 1416   [place] 08/20/2018 1416   HGBUR trace-intact 11/22/2010 1062  BILIRUBINUR NEGATIVE 08/20/2018 1416   BILIRUBINUR negative 02/08/2018 1021   BILIRUBINUR Negative 02/17/2008 1122   KETONESUR NEGATIVE 08/20/2018 1416   PROTEINUR NEGATIVE 08/20/2018 1416   UROBILINOGEN 0.2 02/08/2018 1021   UROBILINOGEN 1.0 02/01/2015 1654   NITRITE NEGATIVE 08/20/2018 1416   LEUKOCYTESUR NEGATIVE 08/20/2018 1416   LEUKOCYTESUR Moderate 02/17/2008  1122    Radiological Exams on Admission: CT Angio Chest PE W and/or Wo Contrast  Result Date: 09/08/2019 CLINICAL DATA:  Shortness of breath, history metastatic breast cancer, fluid overload, interstitial lung disease EXAM: CT ANGIOGRAPHY CHEST WITH CONTRAST TECHNIQUE: Multidetector CT imaging of the chest was performed using the standard protocol during bolus administration of intravenous contrast. Multiplanar CT image reconstructions and MIPs were obtained to evaluate the vascular anatomy. CONTRAST:  17m OMNIPAQUE IOHEXOL 350 MG/ML SOLN COMPARISON:  05/07/2017 FINDINGS: Cardiovascular: Satisfactory opacification of the pulmonary arteries to the segmental level. No evidence of pulmonary embolism. Cardiomegaly. Enlargement of the main pulmonary artery, measuring up to 3.8 cm. No pericardial effusion. Mediastinum/Nodes: No enlarged mediastinal, hilar, or axillary lymph nodes. Thyroid gland, trachea, and esophagus demonstrate no significant findings. Lungs/Pleura: There is extensive bilateral, somewhat geographic heterogeneous and ground-glass airspace opacity. 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 No pleural effusion or pneumothorax. Upper Abdomen: No acute abnormality. Musculoskeletal: Status post bilateral mastectomy. No acute or significant osseous findings. Review of the MIP images confirms the above findings. IMPRESSION: 1.  Negative examination for pulmonary embolism. 2. Extensive bilateral, somewhat geographic heterogeneous and ground-glass airspace opacity, consistent with multifocal infection, including COVID-19 if clinically suspected. 3. There is underlying fibrotic appearing bronchiectasis of the perihilar lungs, new compared to prior examination dated 2018 although not well evaluated on this non tailored examination and in the presence of acute  airspace disease. There is additional radiation fibrosis of the anterior left lung similar to prior examination. 4.  Cardiomegaly. 5. Enlargement of the main pulmonary artery, as can be seen in pulmonary hypertension. Electronically Signed   By: AEddie CandleM.D.   On: 09/08/2019 20:38   DG Chest Portable 1 View  Result Date: 09/08/2019 CLINICAL DATA:  Shortness of breath.  History of breast carcinoma EXAM: PORTABLE CHEST 1 VIEW COMPARISON:  Oct 20, 2018 FINDINGS: Port-A-Cath tip is in the right atrium slightly beyond the cavoatrial junction level. No pneumothorax. There is widespread airspace opacity throughout the left lung diffusely as well as in the right upper lobe and to a lesser extent in the right base. Heart size is within normal limits. Pulmonary vascularity is stable with distortion on the left. Soft tissue prominence in the left hilar region is stable and potentially may represent a degree of underlying adenopathy. Postoperative changes noted in each axillary region with evidence of bilateral mastectomies. No bone lesions. IMPRESSION: Widespread airspace opacity bilaterally. Suspect widespread pneumonia. It should be noted that underlying lymphangitic spread of tumor may be present. Both entities may be present concurrently. There is apparent persistent scarring with volume loss left upper lobe region. Soft tissue prominence in this area is stable and likely due to scarring; a degree of superimposed adenopathy in this area cannot be excluded by radiography. Port-A-Cath tip is in the right atrium slightly beyond the cavoatrial junction. Bilateral mastectomies noted. Electronically Signed   By: WLowella GripIII M.D.   On: 09/08/2019 15:11    EKG: Independently reviewed. Sinus tachycardia, LAE.  Rate  is faster when compared to prior.  Assessment/Plan Principal Problem:   Acute on chronic respiratory failure with hypoxia (HCC) Active Problems:   Pulmonary hypertension (HCC)    Thromboembolic disorder (HCC)   ILD (interstitial lung disease) ([place])  [name] is a 64 y.o. female with medical history significant for metastatic breast cancer s/p bilateral mastectomy, trastuzumab deruxtecan induced ILD, pulmonary hypertension, and thromboembolic disorder who is admitted with acute on chronic respiratory failure with hypoxia due to ILD.  Acute on chronic respiratory failure with hypoxia due to chemotherapy-induced interstitial lung disease and pulmonary hypertension: Echocardiogram 08/29/2019 showed EF 72%, normal diastolic function, moderate TR. She has been on a prednisone taper and torsemide at home.  There is no obvious infection/pneumonia. 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 S/p bilateral mastectomy.  Previous extensive treatment regimens most recently trastuzumab deruxtecan which was discontinued due to medication induced ILD.  Follows with oncology, Dr. Derrill [facility], with [facility].  She has been off chemotherapy at least 6 weeks now and had plans to follow-up with her oncologist this week with possibility of restarting chemotherapy.  She was placed on the [facility] transfer wait list in the ED, can consider direct transfer pending clinical progress versus return to home after acute issue improves.  Thromboembolic disorder with history of Port-A-Cath associated clot in August 2010: Taken off Eliquis on recent hospitalization at [facility] due to thrombocytopenia.  Thrombocytopenia is now resolved.  CTA chest is negative for PE.  Will continue to hold anticoagulation.   DVT prophylaxis: Subcutaneous heparin Code Status: Full code, confirmed with patient Family Communication: Discussed with husband at bedside Disposition Plan: From home, likely discharge to home if improving symptomatically  versus transfer to [facility]. Consults called: None Admission status:  Status is: Observation  The patient remains OBS appropriate and will d/c before 2 midnights.  Dispo: The patient is from: Home              Anticipated d/c is to: Home              Anticipated d/c date is: 2 days              Patient currently is not medically stable to d/c.    Zada Finders MD [facility]  If 7PM-7AM, please contact night-coverage [email or web address]  09/08/2019, 11:16 PM

## 2019-09-08 NOTE — ED Provider Notes (Signed)
[facility] EMERGENCY DEPARTMENT Provider Note   CSN: [id number] Arrival date & time: 09/08/19  1346     History Chief Complaint  Patient presents with  . Shortness of Breath    [name] is a 64 y.o. female.  HPI   Patient is a 64 year old female with a history of breast cancer metastasized to the skin, VTE, mitral valve prolapse, pulmonary hypertension, interstitial lung disease, who presents to the emergency department today for evaluation of shortness of breath.  Of note, patient was recently admitted to [facility] for hypervolemia.  She had gained 20 pounds in a short period of time and was admitted for 10 days for diuresis.  She was discharged on 4 L O2 which she had not previously been on.  She was also discharged on a prednisone taper that started with 50 mg prednisone 3 times daily x2 weeks, then she transition to 50 mg twice daily and most recently has been on 25 mg.  States that she is about to finish the last of her taper but became short of breath over the last 3 days.  Today her symptoms worsened significantly.  She denies any associated chest pain, pleuritic pain, cough.  She has had improvement of her bilateral lower extremity edema since being discharged from the hospital and has lost 9 pounds in the last 3 days.  She has been compliant with her Bumex.  She denies other symptoms at this time.  Per EMS report, patient was noted to have sats in the 50s on 4 L nasal cannula.  She was placed on a nonrebreather and sats improved to the 90s.  Additionally, patient notes history of VTE and had previously been on Eliquis however this was stopped 13 days ago due to thrombocytopenia.  Oncologist: Dr. Ysidro Evert Force Pulmonologist: Dr. Robley Fries  Past Medical History:  Diagnosis Date  . Arthritis   . Breast cancer metastasized to skin, right [facility]) followed by dr force -- oncolgoist w/ [place]   primary left breast cancer dx 01/ 2009 ; 11/ 2009 recurrence  left chest wall and neck, tx clinical trial drug and chemotherapy;  2015 recurrence cutaneous metastatized to right skin/ chest wall, 02/ 2015 resection chest wall disease and 02-23-2015 s/p skin flap surgery,  chemo every 3 weeks  . 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 Heart murmur   . 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 MVP (mitral valve prolapse)    mild mvp w/ mild regurg. per last echo 04-24-2017  . Neuropathy due to chemotherapeutic drug ([place])   . Non-healing surgical wound    right knee post re-implantation total knee arthroplasty 04-2017 unable to completely close surgical incision  . PONV (postoperative nausea and vomiting)    ponv likes scopolamine patch  . Port-A-Cath in place    RIGHT CHEST   . Red blood cell antibody positive   . Skin changes related to chemotherapy    per patient she has scabbed over skin circumventing port to right upper chest ;  state it is due to chemptherapy   . 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 Acute on chronic respiratory failure with hypoxia ([place]) 09/08/2019  . Sepsis due to undetermined organism ([place]) 08/02/2018  . Hypomagnesemia 08/02/2018  . Mild protein malnutrition ([place]) 08/02/2018  . Elevated alkaline phosphatase level 08/02/2018  . Thrombocytopenia ([place]) 08/02/2018  . Pulmonary hypertension ([place]) 08/02/2018  . Vocal cord paralysis 03/31/2018  . Chronic left shoulder pain 01/15/2018  . Wound, open with complication  [id number]  . Anemia due to chemotherapy 05/07/2017  . Abnormal LFTs 05/07/2017  . History of breast cancer   . Bilateral leg edema   . S/P revision of total knee, right 04/27/2017  . Physical exam 07/31/2016  . S/P total knee arthroplasty, right 05/31/2016  . 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 Wound dehiscence, surgical 04/12/2016  . 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 Vitamin D deficiency 01/25/2016  . 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 Primary osteoarthritis of right knee 02/01/2015  . Symptomatic anemia 02/01/2015  . Frequent UTI 01/12/2014  . Sinus tachycardia 11/03/2013  . SOB (shortness of breath) 11/03/2013  . Neuropathy due to chemotherapeutic drug ([place]) 07/25/2011  . 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 Metastatic breast cancer ([place]) 11/12/2010  . Chronic anticoagulation 11/12/2010  . Anxiety, generalized 03/26/2008    Past Surgical History:  Procedure Laterality Date  . APPLICATION OF A-CELL OF BACK Right 07/31/2016   Procedure: CELLERATE COLLAGEN PLACEMENT;  Surgeon: Loel Lofty Dillingham, DO;  Location: [facility];  Service: Plastics;  Laterality: Right;  . APPLICATION OF WOUND VAC Right 06/29/2017   Procedure: APPLICATION OF WOUND VAC;  Surgeon: Wallace Going, DO;  Location: [facility];  Service: Plastics;  Laterality: Right;  . DEBRIDEMENT CHEST WALL RIGHT/ VAC PLACEMENT  02-09-2015    [facility]  . EXCISIONAL TOTAL KNEE ARTHROPLASTY WITH ANTIBIOTIC SPACERS Right 12/02/2016   Procedure: EXCISIONAL TOTAL KNEE ARTHROPLASTY WITH ANTIBIOTIC SPACERS;  Surgeon: Paralee Cancel, MD;  [facility];  Service: Orthopedics;  Laterality: Right;  90 mins  . HEMATOMA EVACUATION Right 06/29/2017   Procedure: EVACUATION HEMATOMA OF RIGHT KNEE;  Surgeon: Wallace Going, DO;  [facility];  Service: Plastics;  Laterality: Right;  . HEMATOMA EVACUATION Right 06/29/2017   Procedure: EVACUATION RIGHT TOTAL KNEE HEMATOMA;  Surgeon: Paralee Cancel, MD;  [facility];  Service: Orthopedics;  Laterality: Right;  . HEMATOMA EVACUATION Right 07/14/2017   Procedure: EVACUATION RIGHT LEG HEMATOMA WITH APPLICATION OF WOUND VAC;  Surgeon: Paralee Cancel, MD;  [facility];  Service: Orthopedics;  Laterality: Right;  . hemotoma     evacuation left chest wall  . I & D EXTREMITY Right 07/17/2017   Procedure: EVACUATION RIGHT LEG HEMATOMA WITH WOUND VAC DRESSING CHANGE;  Surgeon: Paralee Cancel, MD;  [facility];  Service: Orthopedics;  Laterality: Right;  . I & D KNEE WITH POLY EXCHANGE Right 05/28/2016   Procedure: IRRIGATION AND DEBRIDEMENT KNEE WOUND VAC PLACMENT;  Surgeon: Susa Day, MD;  [facility];  Service: Orthopedics;  Laterality: Right;  . I & D KNEE WITH POLY EXCHANGE Right 05/30/2016   Procedure: RADICAL SYNOVECTOMY,IRRIGATION AND DEBRIDEMENT KNEE WITH POLY EXCHANGE WITH ANTIBIOTIC BEADS, APPLICATION OF WOUND VAC;  Surgeon: Susa Day, MD;  [facility];  Service: Orthopedics;  Laterality: Right;  . INCISION AND DRAINAGE OF WOUND Right 07/31/2016   Procedure: IRRIGATION  AND DEBRIDEMENT RIGHT KNEE  WOUND;  Surgeon: Loel Lofty Dillingham, DO;  Location: [facility];  Service: Plastics;  Laterality: Right;  . IR FLUORO GUIDE CV LINE LEFT  04/30/2017  . IR US GUIDE VASC ACCESS LEFT  04/30/2017  . IRRIGATION AND DEBRIDEMENT KNEE Right 04/12/2016   Procedure: IRRIGATION AND DEBRIDEMENT KNEE;  Surgeon: Nicholes Stairs, MD;  [facility];  Service: Orthopedics;  Laterality: Right;  . IRRIGATION AND DEBRIDEMENT KNEE Right 12/16/2016   Procedure: Repeat irrigation and debridement right knee, wound closure  wound vac REPLACEMENT ANTIBIOTIC SPACERS;  Surgeon: Paralee Cancel, MD;  [facility];  Service: Orthopedics;  Laterality: Right;  . KNEE ARTHROSCOPY  02/13/2012   Procedure: ARTHROSCOPY KNEE;  Surgeon: Johnn Hai, MD;  Location: [facility];  Service: Orthopedics;  Laterality: Left;  WITH DEBRIDEMENt   . KNEE ARTHROSCOPY  WITH LATERAL MENISECTOMY  02/13/2012   Procedure: KNEE ARTHROSCOPY WITH LATERAL MENISECTOMY;  Surgeon: Johnn Hai, MD;  Location: [facility];  Service: Orthopedics;;  partial  . LATISSIMUS FLAP RIGHT CHEST  02-23-2015   [facility]  . LEFT MODIFIED RADICAL MASTECTOMY/ RIGHT TOTAL MASTECTOMY  11-13-2007   LEFT BREAST CANCER W/ AXILLARY LYMPH NODE METASTASIS AND POST NEOADJUVANT CHEMO  . MASS EXCISION Right 06/29/2017   Procedure: EXCISION OF METASTATIC BREAST CANCER TO SKIN RIGHT SHOULDER, PLACEMENT OF CELLERATE;  Surgeon: Wallace Going, DO;  [facility];  Service: Plastics;  Laterality: Right;  . PLACEMENT PORT-A-CATH  06/22/2007    new port placed 2015, right chest  . REIMPLANTATION OF TOTAL KNEE Right 04/27/2017   Procedure: Reimplantation of right total knee arthroplasty;  Surgeon: Paralee Cancel, MD;  [facility];  Service: Orthopedics;  Laterality: Right;  Adductor Block  . RESECTION OF ISOLATED CHEST WALL DISEASE/ ABDOMINAL SKIN FLAP  02/ 2015     [facility]   RIGHT CHEST WALL METASTATIC SKIN CARCINOMA  . RIGHT HEART CATH N/A 01/27/2018   Procedure: RIGHT HEART CATH;  Surgeon: Jolaine Artist, MD;  Location: [facility];  Service: Cardiovascular;  Laterality: N/A;  . SKIN SPLIT GRAFT Right 01/29/2017   Procedure: SKIN GRAFT SPLIT THICKNESS TO RIGHT KNEE WOUND;  Surgeon: Wallace Going, DO;  [facility];  Service: Plastics;  Laterality: Right;  . SKIN SPLIT GRAFT Right 09/07/2017   Procedure: SKIN GRAFT SPLIT THICKNESS TO RIGHT KNEE WOUND WITH PLACEMENT OF VAC DRESSING TO GRAFT SITE;  Surgeon: Wallace Going, DO;  Location: [facility];  Service: Plastics;  Laterality: Right;  . TOTAL KNEE ARTHROPLASTY Left 09/23/2012   Procedure: LEFT TOTAL KNEE ARTHROPLASTY;  Surgeon: Johnn Hai, MD;  [facility];  Service: Orthopedics;  Laterality: Left;  . TOTAL KNEE ARTHROPLASTY Right 03/20/2016   Procedure: RIGHT TOTAL KNEE  ARTHROPLASTY;  Surgeon: Susa Day, MD;  [facility];  Service: Orthopedics;  Laterality: Right;  . TRANSTHORACIC ECHOCARDIOGRAM  04/24/2017   ef [id number], grade 1 diastolic dysfuction/  mild MR with mild prolapse anterior leaflet/ mild TR/ PASP 70mmHg     OB History   No obstetric history on file.     Family History  Problem Relation Age of Onset  . Heart disease Mother        due to mitral valve regurgiation,   . Cancer Mother        breast  . Allergic rhinitis Mother   . Parkinsonism Father   . Allergic rhinitis Father   .  Migraines Father   . Alcohol abuse Paternal Grandfather   . Angioedema Neg Hx   . Asthma Neg Hx   . Eczema Neg Hx     Social History   Tobacco Use  . Smoking status: Never Smoker  . Smokeless tobacco: Never Used  Substance Use Topics  . Alcohol use: No  . Drug use: No    Home Medications Prior to Admission medications   Medication Sig Start Date End Date Taking? Authorizing Provider  acetaminophen (TYLENOL) 500 MG tablet Take 500 mg by mouth every 8 (eight) hours as needed for mild pain or headache.   Yes [provider]  cefdinir (OMNICEF) 300 MG capsule Take 300 mg by mouth every 12 (twelve) hours. 09/04/19 09/09/19 Yes [provider]  celecoxib (CELEBREX) 200 MG capsule Take 200 mg by mouth daily as needed for mild pain.  02/19/15  Yes [provider]  cetirizine (ZYRTEC) 10 MG tablet Take 1 tablet (10 mg total) daily by mouth. 04/03/17  Yes Midge Minium, MD  Cholecalciferol (VITAMIN D3) 2000 UNITS TABS Take 2,000 Units by mouth 4 (four) times a week.    Yes [provider]  doxycycline (MONODOX) 100 MG capsule Take 100 mg by mouth 2 (two) times daily. 08/11/18  Yes [provider]  gabapentin (NEURONTIN) 300 MG capsule Take 300 mg by mouth 3 (three) times daily.   Yes [provider]  mirabegron ER (MYRBETRIQ) 25 MG TB24 tablet Take 1 tablet (25 mg total) by mouth daily. 02/08/18  Yes  Midge Minium, MD  omeprazole (PRILOSEC) 20 MG capsule Take 20 mg by mouth daily.   Yes [provider]  ondansetron (ZOFRAN) 8 MG tablet TAKE 1 TABLET BY MOUTH EVERY 8 HOURS AS NEEDED FOR NAUSEA. Patient taking differently: Take 8 mg by mouth every 8 (eight) hours as needed for nausea.  11/08/18  Yes Midge Minium, MD  polyethylene glycol powder (GLYCOLAX/MIRALAX) 17 GM/SCOOP powder Take 17 g by mouth 3 (three) times daily as needed. Patient taking differently: Take 17 g by mouth 3 (three) times daily as needed for mild constipation.  08/18/19  Yes Midge Minium, MD  potassium chloride SA (KLOR-CON) 20 MEQ tablet Take 2 tablets by mouth daily. 09/03/19 09/02/20 Yes [provider]  prednisoLONE acetate (PRED FORTE) 1 % ophthalmic suspension Place 1 drop into the left eye in the morning and at bedtime. Please begin medication after scheduled procedure.  08/15/19  Yes [provider]  sildenafil (REVATIO) 20 MG tablet Take 2 tablets (40 mg total) by mouth 3 (three) times daily. 08/04/19  Yes Bensimhon, Shaune Pascal, MD  torsemide (DEMADEX) 20 MG tablet Take 20 mg by mouth daily. Take additional dose of Torsemide if weight increases by 1-2Ib in a day or 5Ibs in a week. 09/03/19 09/02/20 Yes [provider]  dexamethasone (DECADRON) 4 MG tablet TAKE 2 TABLETS (8 MG TOTAL) BY MOUTH ONCE DAILY ON DAYS 2 & 3 FOLLOWING CHEMOTHERAPY 12/31/18   [provider]  ELIQUIS 5 MG TABS tablet TAKE 1 TABLET BY MOUTH TWICE A DAY Patient taking differently: Take 5 mg by mouth 2 (two) times daily.  04/11/19   Midge Minium, MD  spironolactone (ALDACTONE) 25 MG tablet Take 0.5 tablets (12.5 mg total) by mouth daily. 06/15/19   Bensimhon, Shaune Pascal, MD    Allergies    Penicillins, Tape, and Other  Review of Systems   Review of Systems  Constitutional: Negative for chills and  fever.  HENT: Negative for ear pain and sore throat.   Eyes: Negative for pain and visual  disturbance.  Respiratory: Positive for shortness of breath. Negative for cough.   Cardiovascular: Positive for leg swelling. Negative for chest pain.  Gastrointestinal: Negative for abdominal pain, constipation, diarrhea, nausea and vomiting.  Genitourinary: Negative for dysuria and hematuria.  Musculoskeletal: Negative for back pain.  Skin: Negative for rash.  Neurological: Negative for syncope and headaches.  All other systems reviewed and are negative.   Physical Exam Updated Vital Signs BP 99/60   Pulse (!) 112   Temp 98.2 F (36.8 C) (Oral)   Resp 18   Ht 5\' 7"  (1.702 m)   Wt 86.6 kg   SpO2 92%   BMI 29.90 kg/m   Physical Exam Vitals and nursing note reviewed.  Constitutional:      General: She is not in acute distress.    Appearance: She is well-developed.  HENT:     Head: Normocephalic and atraumatic.  Eyes:     Conjunctiva/sclera: Conjunctivae normal.  Cardiovascular:     Rate and Rhythm: Regular rhythm. Tachycardia present.     Heart sounds: Normal heart sounds. No murmur.  Pulmonary:     Effort: Pulmonary effort is normal. Tachypnea present. No respiratory distress.     Breath sounds: Examination of the right-lower field reveals rales. Examination of the left-lower field reveals rales. Decreased breath sounds and rales present.  Abdominal:     Palpations: Abdomen is soft.     Tenderness: There is no abdominal tenderness.  Musculoskeletal:     Cervical back: Neck supple.     Right lower leg: Tenderness present.     Left lower leg: Tenderness present.     Comments: ble edema improving per patient  Skin:    General: Skin is warm and dry.  Neurological:     Mental Status: She is alert.     ED Results / Procedures / Treatments   Labs (all labs ordered are listed, but only abnormal results are displayed) Labs Reviewed  BASIC METABOLIC PANEL - Abnormal; Notable for the following components:      Result Value   Sodium 133 (*)    Chloride 91 (*)     Glucose, Bld 252 (*)    Calcium 8.4 (*)    Anion gap 16 (*)    All other components within normal limits  CBC - Abnormal; Notable for the following components:   RBC 2.84 (*)    Hemoglobin 11.0 (*)    HCT 33.8 (*)    MCV 119.0 (*)    MCH 38.7 (*)    RDW 20.0 (*)    Platelets 137 (*)    All other components within normal limits  DIFFERENTIAL - Abnormal; Notable for the following components:   Lymphs Abs 0.3 (*)    All other components within normal limits  BRAIN NATRIURETIC PEPTIDE - Abnormal; Notable for the following components:   B Natriuretic Peptide 761.6 (*)    All other components within normal limits  LACTIC ACID, PLASMA - Abnormal; Notable for the following components:   Lactic Acid, Venous 3.4 (*)    All other components within normal limits  LACTIC ACID, PLASMA - Abnormal; Notable for the following components:   Lactic Acid, Venous 2.0 (*)    All other components within normal limits  CBC - Abnormal; Notable for the following components:   RBC 2.81 (*)    Hemoglobin 11.1 (*)    HCT  33.4 (*)    MCV 118.9 (*)    MCH 39.5 (*)    RDW 19.9 (*)    Platelets 123 (*)    All other components within normal limits  HEPATIC FUNCTION PANEL - Abnormal; Notable for the following components:   Total Protein 5.1 (*)    Albumin 2.4 (*)    ALT 48 (*)    Alkaline Phosphatase 183 (*)    Total Bilirubin 2.4 (*)    Bilirubin, Direct 0.6 (*)    Indirect Bilirubin 1.8 (*)    All other components within normal limits  TROPONIN I (HIGH SENSITIVITY) - Abnormal; Notable for the following components:   Troponin I (High Sensitivity) 105 (*)    All other components within normal limits  TROPONIN I (HIGH SENSITIVITY) - Abnormal; Notable for the following components:   Troponin I (High Sensitivity) 111 (*)    All other components within normal limits  RESPIRATORY PANEL BY RT PCR (FLU A&B, COVID)  CULTURE, BLOOD (ROUTINE X 2)  CULTURE, BLOOD (ROUTINE X 2)  BASIC METABOLIC PANEL     EKG EKG Interpretation  Date/Time:  Thursday September 08 2019 13:56:12 EDT Ventricular Rate:  138 PR Interval:    QRS Duration: 97 QT Interval:  296 QTC Calculation: 449 R Axis:   148 Text Interpretation: Sinus tachycardia LAE, consider biatrial enlargement Consider right ventricular hypertrophy Confirmed by Virgel Manifold [phone]) on 09/08/2019 3:07:28 PM   Radiology CT Angio Chest PE W and/or Wo Contrast  Result Date: 09/08/2019 CLINICAL DATA:  Shortness of breath, history metastatic breast cancer, fluid overload, interstitial lung disease EXAM: CT ANGIOGRAPHY CHEST WITH CONTRAST TECHNIQUE: Multidetector CT imaging of the chest was performed using the standard protocol during bolus administration of intravenous contrast. Multiplanar CT image reconstructions and MIPs were obtained to evaluate the vascular anatomy. CONTRAST:  134mL OMNIPAQUE IOHEXOL 350 MG/ML SOLN COMPARISON:  05/07/2017 FINDINGS: Cardiovascular: Satisfactory opacification of the pulmonary arteries to the segmental level. No evidence of pulmonary embolism. Cardiomegaly. Enlargement of the main pulmonary artery, measuring up to 3.8 cm. No pericardial effusion. Mediastinum/Nodes: No enlarged mediastinal, hilar, or axillary lymph nodes. Thyroid gland, trachea, and esophagus demonstrate no significant findings. Lungs/Pleura: There is extensive bilateral, somewhat geographic heterogeneous and ground-glass airspace opacity. 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 No pleural effusion or pneumothorax. Upper Abdomen: No acute abnormality. Musculoskeletal: Status post bilateral mastectomy. No acute or significant osseous findings. Review of the MIP images confirms the above findings. IMPRESSION: 1.  Negative examination for pulmonary embolism. 2. Extensive bilateral, somewhat geographic  heterogeneous and ground-glass airspace opacity, consistent with multifocal infection, including COVID-19 if clinically suspected. 3. 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 There is additional radiation fibrosis of the anterior left lung similar to prior examination. 4.  Cardiomegaly. 5. Enlargement of the main pulmonary artery, as can be seen in pulmonary hypertension. Electronically Signed   By: Eddie Candle M.D.   On: 09/08/2019 20:38   DG Chest Portable 1 View  Result Date: 09/08/2019 CLINICAL DATA:  Shortness of breath.  History of breast carcinoma EXAM: PORTABLE CHEST 1 VIEW COMPARISON:  Oct 20, 2018 FINDINGS: Port-A-Cath tip is in the right atrium slightly beyond the cavoatrial junction level. No pneumothorax.  There is widespread airspace opacity throughout the left lung diffusely as well as in the right upper lobe and to a lesser extent in the right base. Heart size is within normal limits. Pulmonary vascularity is stable with distortion on the left. Soft tissue prominence in the left hilar region is stable and potentially may represent a degree of underlying adenopathy. Postoperative changes noted in each axillary region with evidence of bilateral mastectomies. No bone lesions. IMPRESSION: Widespread airspace opacity bilaterally. Suspect widespread pneumonia. It should be noted that underlying lymphangitic spread of tumor may be present. Both entities may be present concurrently. There is apparent persistent scarring with volume loss left upper lobe region. Soft tissue prominence in this area is stable and likely due to scarring; a degree of superimposed adenopathy in this area cannot be excluded by radiography. Port-A-Cath tip is in the right atrium slightly beyond the cavoatrial junction. Bilateral mastectomies  noted. Electronically Signed   By: Lowella Grip III M.D.   On: 09/08/2019 15:11    Procedures Procedures (including critical care time) CRITICAL CARE Performed by: Rodney Booze   Total critical care time: 45 minutes  Critical care time was exclusive of separately billable procedures and treating other patients.  Critical care was necessary to treat or prevent imminent or life-threatening deterioration.  Critical care was time spent personally by me on the following activities: development of treatment plan with patient and/or surrogate as well as nursing, discussions with consultants, evaluation of patient's response to treatment, examination of patient, obtaining history from patient or surrogate, ordering and performing treatments and interventions, ordering and review of laboratory studies, ordering and review of radiographic studies, pulse oximetry and re-evaluation of patient's condition.   Medications Ordered in ED Medications  sodium chloride flush (NS) 0.9 % injection 10-40 mL (has no administration in time range)  Chlorhexidine Gluconate Cloth 2 % PADS 6 each (has no administration in time range)  vancomycin (VANCOREADY) IVPB 1750 mg/350 mL (1,750 mg Intravenous New Bag/Given 09/08/19 2207)  ceFEPIme (MAXIPIME) 2 g in sodium chloride 0.9 % 100 mL IVPB (0 g Intravenous Stopped 09/08/19 2208)  albuterol (PROVENTIL) (2.5 MG/3ML) 0.083% nebulizer solution 5 mg (5 mg Nebulization Given 09/08/19 1839)  methylPREDNISolone sodium succinate (SOLU-MEDROL) 125 mg/2 mL injection 125 mg (125 mg Intravenous Given 09/08/19 1616)  albuterol (VENTOLIN HFA) 108 (90 Base) MCG/ACT inhaler 2 puff (2 puffs Inhalation Given 09/08/19 1619)  aspirin chewable tablet 324 mg (324 mg Oral Given 09/08/19 1616)  sodium chloride 0.9 % bolus 250 mL (250 mLs Intravenous New Bag/Given 09/08/19 1619)  iohexol (OMNIPAQUE) 350 MG/ML injection 100 mL (100 mLs Intravenous Contrast Given 09/08/19 2025)  furosemide  (LASIX) injection 40 mg (40 mg Intravenous Given 09/08/19 2132)    ED Course  I have reviewed the triage vital signs and the nursing notes.  Pertinent labs & imaging results that were available during my care of the patient were reviewed by me and considered in my medical decision making (see chart for details).  Clinical Course as of Sep 07 2313  Thu Sep 07, 4432  2967 64 year old female with complicated medical history and just discharged from [facility] here with increased shortness of breath.  She is requiring nonrebreather currently.  She is able to speak in full sentences though with clear sensorium.  Tachycardic here with elevated lactate.  Getting CT chest steroids breathing treatment.  Will need admission or transfer.   [MB]    Clinical Course User Index [MB] Hayden Rasmussen, MD  MDM Rules/Calculators/A&P                      65 y/o F with h/o metastatic breast CA, pulm HTN, and ILD presenting with hypoxia. Recent admission to [facility] for hypervolemia. D/c on 4L [place]. Now hypoxic in the 59s and requiring NRB.   Will get labs, cxr, cta, ekg  Cbc  W/o leukocytosis, mild anemia present Bmp without significant electrolyte derangement, mild hyperglycemia and mildly elevated anion gap Trop elevated at 100, this is most likely 2/2 demand, delta trop is flat Lactic initially elevated at 3.4, 250cc bolus given as pt seems intravascularly hypovolemic as well, lactic improved to 2 following this Covid/flu neg  CXR  Widespread airspace opacity bilaterally. Suspect widespread pneumonia. It should be noted that underlying lymphangitic spread of tumor may be present. Both entities may be present concurrently. There is apparent persistent scarring with volume loss left upper lobe region. Soft tissue prominence in this area is stable and likely due to scarring; a degree of superimposed adenopathy in this area cannot be excluded by radiography. Port-A-Cath tip is in the right atrium slightly beyond the  cavoatrial junction. Bilateral mastectomies noted.  CTA chest 1.  Negative examination for pulmonary embolism. 2. Extensive bilateral, somewhat geographic heterogeneous and ground-glass airspace opacity, consistent with multifocal infection, including COVID-19 if clinically suspected. 3. 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 There is additional radiation fibrosis of the anterior left lung similar to prior examination. 4.  Cardiomegaly. 5. Enlargement of the main pulmonary artery, as can be seen in pulmonary hypertension  Pt with h/o chronic lung disease with respiratory failure and concern for possible infection versus pulm edema. Will discuss with [facility] for admission since this is where patient primarily receives care.   9:07 PM Discussed case with [facility] transfer service. Pt requires MICU which is currently full.   9:25 PM CONSULT with Dr. Renda Rolls with [facility] MICU. Recommend diuresis 40 lasix. Pt is now placed on waitlist for transfer to [place]. They state that if patient is able to be weaned off the NRB, she can go to the floor and be managed by her primary oncology team. They advised to update transfer line on patient course.   Will admit patient to the hospitlaist service pending transfer to [facility].   9:45 PM CONSULT with Dr. Posey Pronto who accepts patient for admission.   Final Clinical Impression(s) / ED Diagnoses Final diagnoses:  Acute respiratory failure with hypoxia [facility])    Rx / DC Orders ED Discharge Orders    None       Bishop Dublin 09/08/19 2315    Hayden Rasmussen, MD 09/09/19 1032

## 2019-09-08 NOTE — ED Notes (Signed)
RN Eugene Garnet to draw blood cultures

## 2019-09-08 NOTE — ED Triage Notes (Signed)
Pt here with [facility]cems with co SOB. EMS found her to be in the 50s on pulse ox on 4L [place] at home. EMS applied NRB and her saturation came up to the 90s. Hx of interstitial lung disease, fluid overload breast cancer. Pt has been on prednisone for the the lung disease.

## 2019-09-08 NOTE — ED Provider Notes (Signed)
MSE was initiated and I personally evaluated the patient and placed orders (if any) at  2:39 PM on September 08, 2019.  The patient appears stable so that the remainder of the MSE may be completed by another provider.  63yF with dyspnea. Hx of breast CA, chemotherapy induced interstitial lung disease, pulmonary htm, anemia, thrombocytopenia. Just recently admitted with severe volume overload at [facility] from 08/26/19 - [id number] with complicated stay. She feels like her breathing worsened yesterday and further today despite her edema improving. She was discharged on 4L of supplemental o2. EMS found her hypoxic in the 59s and placed her on NRB. Currently feeling better with o2 sats in 90s although still on significant o2. No acute pain. No fevers.    Virgel Manifold, MD 09/08/19 1451

## 2019-09-08 NOTE — Progress Notes (Signed)
Pharmacy Antibiotic Note  [name] is a 64 y.o. female admitted on 09/08/2019 with pneumonia.  Pharmacy has been consulted for Vancomycin and Cefepime dosing. Patient recently had prolonged admission at [facility] and was discharged on cefdinir. Ground-glass opacities on CXR, Covid-neg. Afebrile, wbc wnl, lactate 2. Scr 0.91.  Plan: Vancomycin 1750mg  IV Q24 hrs (Est AUC 505.9, Scr 0.91, Vd 0.72) Cefepime 2g IV Q8 hrs Monitor renal function, cultures/sensitivities, clinical progression Check vancomycin levels as indicated     Temp (24hrs), Avg:98.2 F (36.8 C), Min:98.2 F (36.8 C), Max:98.2 F (36.8 C)  Recent Labs  Lab 09/08/19 1410 09/08/19 1443 09/08/19 2004  WBC 8.1 7.3  --   CREATININE 0.91  --   --   LATICACIDVEN  --  3.4* 2.0*    CrCl cannot be calculated (Unknown ideal weight.).    Allergies  Allergen Reactions  . Penicillins Hives, Itching, Rash and Other (See Comments)    Patient has previously tolerated cefazolin, ceftriaxone, and cefepime  PATIENT HAS HAD A PCN REACTION WITH IMMEDIATE RASH, FACIAL/TONGUE/THROAT SWELLING, SOB, OR LIGHTHEADEDNESS WITH HYPOTENSION:  #  #  YES  #  #  Has patient had a PCN reaction causing severe rash involving mucus membranes or skin necrosis: No Has patient had a PCN reaction that required hospitalization No Has patient had a PCN reaction occurring within the last 10 years: yes Denies airway involvement   . Tape Hives and Other (See Comments)    Can tolerate paper and adhesive,  NO MEDIPORE TAPE  . Other Rash and Other (See Comments)    STERI STRIPS - Blisters    Antimicrobials this admission: Vancomycin 4/15 >>  Cefepime 4/15 >>   Dose adjustments this admission: N/A  Microbiology results: 4/15 BCx:    Richardine Service, PharmD PGY1 Pharmacy Resident Phone: [phone] 09/08/2019  9:16 PM  Please check [email or web address] for unit-specific pharmacy phone numbers.

## 2019-09-09 ENCOUNTER — Encounter (HOSPITAL_COMMUNITY): Payer: Self-pay | Admitting: Internal Medicine

## 2019-09-09 DIAGNOSIS — Z853 Personal history of malignant neoplasm of breast: Secondary | ICD-10-CM | POA: Diagnosis not present

## 2019-09-09 DIAGNOSIS — Z923 Personal history of irradiation: Secondary | ICD-10-CM | POA: Diagnosis not present

## 2019-09-09 DIAGNOSIS — Z8249 Family history of ischemic heart disease and other diseases of the circulatory system: Secondary | ICD-10-CM | POA: Diagnosis not present

## 2019-09-09 DIAGNOSIS — I272 Pulmonary hypertension, unspecified: Secondary | ICD-10-CM | POA: Diagnosis present

## 2019-09-09 DIAGNOSIS — E861 Hypovolemia: Secondary | ICD-10-CM | POA: Diagnosis present

## 2019-09-09 DIAGNOSIS — Z9013 Acquired absence of bilateral breasts and nipples: Secondary | ICD-10-CM | POA: Diagnosis not present

## 2019-09-09 DIAGNOSIS — I50811 Acute right heart failure: Secondary | ICD-10-CM

## 2019-09-09 DIAGNOSIS — R000 Tachycardia, unspecified: Secondary | ICD-10-CM

## 2019-09-09 DIAGNOSIS — I50813 Acute on chronic right heart failure: Secondary | ICD-10-CM | POA: Diagnosis present

## 2019-09-09 DIAGNOSIS — G620 Drug-induced polyneuropathy: Secondary | ICD-10-CM | POA: Diagnosis present

## 2019-09-09 DIAGNOSIS — J9601 Acute respiratory failure with hypoxia: Secondary | ICD-10-CM | POA: Diagnosis present

## 2019-09-09 DIAGNOSIS — D630 Anemia in neoplastic disease: Secondary | ICD-10-CM | POA: Diagnosis present

## 2019-09-09 DIAGNOSIS — Z96653 Presence of artificial knee joint, bilateral: Secondary | ICD-10-CM | POA: Diagnosis present

## 2019-09-09 DIAGNOSIS — Y95 Nosocomial condition: Secondary | ICD-10-CM | POA: Diagnosis present

## 2019-09-09 DIAGNOSIS — J189 Pneumonia, unspecified organism: Secondary | ICD-10-CM | POA: Diagnosis present

## 2019-09-09 DIAGNOSIS — D6959 Other secondary thrombocytopenia: Secondary | ICD-10-CM | POA: Diagnosis present

## 2019-09-09 DIAGNOSIS — D849 Immunodeficiency, unspecified: Secondary | ICD-10-CM | POA: Diagnosis present

## 2019-09-09 DIAGNOSIS — E876 Hypokalemia: Secondary | ICD-10-CM

## 2019-09-09 DIAGNOSIS — Z86718 Personal history of other venous thrombosis and embolism: Secondary | ICD-10-CM | POA: Diagnosis not present

## 2019-09-09 DIAGNOSIS — Z20822 Contact with and (suspected) exposure to covid-19: Secondary | ICD-10-CM | POA: Diagnosis present

## 2019-09-09 DIAGNOSIS — J470 Bronchiectasis with acute lower respiratory infection: Secondary | ICD-10-CM | POA: Diagnosis present

## 2019-09-09 DIAGNOSIS — Z95828 Presence of other vascular implants and grafts: Secondary | ICD-10-CM | POA: Diagnosis not present

## 2019-09-09 DIAGNOSIS — T451X5A Adverse effect of antineoplastic and immunosuppressive drugs, initial encounter: Secondary | ICD-10-CM | POA: Diagnosis present

## 2019-09-09 DIAGNOSIS — R739 Hyperglycemia, unspecified: Secondary | ICD-10-CM | POA: Diagnosis present

## 2019-09-09 DIAGNOSIS — J8410 Pulmonary fibrosis, unspecified: Secondary | ICD-10-CM | POA: Diagnosis present

## 2019-09-09 DIAGNOSIS — K761 Chronic passive congestion of liver: Secondary | ICD-10-CM | POA: Diagnosis present

## 2019-09-09 DIAGNOSIS — J9621 Acute and chronic respiratory failure with hypoxia: Secondary | ICD-10-CM | POA: Diagnosis present

## 2019-09-09 DIAGNOSIS — J849 Interstitial pulmonary disease, unspecified: Secondary | ICD-10-CM

## 2019-09-09 DIAGNOSIS — C792 Secondary malignant neoplasm of skin: Secondary | ICD-10-CM | POA: Diagnosis present

## 2019-09-09 LAB — CBC
HCT: 31.9 % — ABNORMAL LOW (ref 36.0–46.0)
Hemoglobin: 10.4 g/dL — ABNORMAL LOW (ref 12.0–15.0)
MCH: 38.5 pg — ABNORMAL HIGH (ref 26.0–34.0)
MCHC: 32.6 g/dL (ref 30.0–36.0)
MCV: 118.1 fL — ABNORMAL HIGH (ref 80.0–100.0)
Platelets: 128 10*3/uL — ABNORMAL LOW (ref 150–400)
RBC: 2.7 MIL/uL — ABNORMAL LOW (ref 3.87–5.11)
RDW: 20 % — ABNORMAL HIGH (ref 11.5–15.5)
WBC: 6.3 10*3/uL (ref 4.0–10.5)
nRBC: 0.3 % — ABNORMAL HIGH (ref 0.0–0.2)

## 2019-09-09 LAB — BASIC METABOLIC PANEL
Anion gap: 12 (ref 5–15)
BUN: 20 mg/dL (ref 8–23)
CO2: 32 mmol/L (ref 22–32)
Calcium: 8.4 mg/dL — ABNORMAL LOW (ref 8.9–10.3)
Chloride: 93 mmol/L — ABNORMAL LOW (ref 98–111)
Creatinine, Ser: 0.71 mg/dL (ref 0.44–1.00)
GFR calc Af Amer: >60 mL/min (ref >60)
GFR calc non Af Amer: >60 mL/min (ref >60)
Glucose, Bld: 164 mg/dL — ABNORMAL HIGH (ref 70–99)
Potassium: 3 mmol/L — ABNORMAL LOW (ref 3.5–5.1)
Sodium: 137 mmol/L (ref 135–145)

## 2019-09-09 LAB — MAGNESIUM: Magnesium: 1.6 mg/dL — ABNORMAL LOW (ref 1.7–2.4)

## 2019-09-09 LAB — HIV ANTIBODY (ROUTINE TESTING W REFLEX): HIV Screen 4th Generation wRfx: NONREACTIVE

## 2019-09-09 MED ORDER — LEVALBUTEROL HCL 0.63 MG/3ML IN NEBU
0.6300 mg | INHALATION_SOLUTION | Freq: Four times a day (QID) | RESPIRATORY_TRACT | Status: DC | PRN
  Administered 2019-09-09 (×2): 0.63 mg via RESPIRATORY_TRACT
  Filled 2019-09-09 (×2): qty 3

## 2019-09-09 MED ORDER — VANCOMYCIN HCL IN DEXTROSE 1-5 GM/200ML-% IV SOLN: 1000.0000 mg | Freq: Two times a day (BID) | INTRAVENOUS | Status: AC

## 2019-09-09 MED ORDER — SODIUM CHLORIDE 0.9 % IV SOLN
2.0000 g | Freq: Three times a day (TID) | INTRAVENOUS | Status: DC
  Administered 2019-09-09: 2 g via INTRAVENOUS
  Filled 2019-09-09 (×2): qty 2

## 2019-09-09 MED ORDER — FUROSEMIDE 10 MG/ML IJ SOLN
80.0000 mg | Freq: Two times a day (BID) | INTRAMUSCULAR | Status: DC
  Administered 2019-09-09: 80 mg via INTRAVENOUS
  Filled 2019-09-09: qty 8

## 2019-09-09 MED ORDER — SODIUM CHLORIDE 0.9 % IV SOLN: 2.0000 g | Freq: Three times a day (TID) | INTRAVENOUS | Status: AC

## 2019-09-09 MED ORDER — GUAIFENESIN ER 600 MG PO TB12: 600.0000 mg | ORAL_TABLET | Freq: Two times a day (BID) | ORAL | Status: DC

## 2019-09-09 MED ORDER — POTASSIUM CHLORIDE 10 MEQ/100ML IV SOLN
10.0000 meq | INTRAVENOUS | Status: AC
  Administered 2019-09-09 (×3): 10 meq via INTRAVENOUS
  Filled 2019-09-09 (×3): qty 100

## 2019-09-09 MED ORDER — METHYLPREDNISOLONE SODIUM SUCC 125 MG IJ SOLR
60.0000 mg | Freq: Two times a day (BID) | INTRAMUSCULAR | Status: DC
  Administered 2019-09-09: 60 mg via INTRAVENOUS
  Filled 2019-09-09: qty 2

## 2019-09-09 MED ORDER — MAGNESIUM SULFATE 2 GM/50ML IV SOLN
2.0000 g | Freq: Once | INTRAVENOUS | Status: AC
  Administered 2019-09-09: 2 g via INTRAVENOUS
  Filled 2019-09-09: qty 50

## 2019-09-09 MED ORDER — LEVALBUTEROL HCL 0.63 MG/3ML IN NEBU: 0.6300 mg | INHALATION_SOLUTION | Freq: Four times a day (QID) | RESPIRATORY_TRACT | 12 refills | Status: AC | PRN

## 2019-09-09 MED ORDER — METOPROLOL TARTRATE 5 MG/5ML IV SOLN
5.0000 mg | Freq: Four times a day (QID) | INTRAVENOUS | Status: DC
  Filled 2019-09-09: qty 5

## 2019-09-09 MED ORDER — METHYLPREDNISOLONE SODIUM SUCC 125 MG IJ SOLR: 60.0000 mg | Freq: Two times a day (BID) | INTRAMUSCULAR | 0 refills | Status: AC

## 2019-09-09 MED ORDER — VANCOMYCIN HCL IN DEXTROSE 1-5 GM/200ML-% IV SOLN
1000.0000 mg | Freq: Two times a day (BID) | INTRAVENOUS | Status: DC
  Administered 2019-09-09: 1000 mg via INTRAVENOUS
  Filled 2019-09-09 (×2): qty 200

## 2019-09-09 MED ORDER — FUROSEMIDE 10 MG/ML IJ SOLN: 80.0000 mg | Freq: Two times a day (BID) | INTRAMUSCULAR | 0 refills | Status: AC

## 2019-09-09 NOTE — ED Notes (Signed)
Dr. Ree Kida of pt request for nebulizer tx with sustained HR above 120 via page

## 2019-09-09 NOTE — Plan of Care (Signed)

## 2019-09-09 NOTE — Consult Note (Signed)
Cardiology Consultation:   Patient ID: [name]; [id number]; 1955-08-06   Admit date: 09/08/2019 Date of Consult: 09/09/2019  Primary Care Provider: Midge Minium, MD Primary Cardiologist: Dr. 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 Posey Pronto.  History of Present Illness:   [name] is a 64 year old female with a history stated above who presented to [facility] with shortness of breath on 09/08/2019.   Hospitalized recently  at [facility] from 08/26/2019 to 09/04/2019 for significant volume overload and anasarca felt secondary to progression of pulmonary hypertension and use of prednisone.  She was diuresed during her hospital course and discharged on oral torsemide 20 mg daily with recommendations to continue home sildenafil 40 mg 3 times daily.  She was also treated for thrombocytopenia felt to be secondary to chemotherapy and congestive hepatopathy in setting of fluid overload.  Her Eliquis was held at that time with improvement in platelet counts.  It was recommended that she continue holding her Eliquis until follow-up with her outpatient oncologist.  Patient reports she was doing well initially after discharge noting a 9 pound weight loss since leaving the hospital however has been having progressive shortness of breath which is worsened over the last 2 days.  She has also noticed increased edema in bilateral lower extremities.  She has been on chronic supplemental O2 at 2 L which is increased recently to 4 L since her hospital discharge.  Prior to admission, despite increasing her supplemental O2, O2 saturations remained in the 70s therefore EMS was called for transport  to the ED for further evaluation.  On ED presentation, vital signs were stable and SPO2 was 96% on 12 L nonrebreather.  BNP was found to be elevated at 761.  Lactic acid was 3.4 initially with repeat at 2.0..  HST 105>> 111. She was given IV Lasix 40 mg once and started on IV vancomycin and cefepime.  Also was given ASA 324x1 as well as IV Solu-Medrol 125 mcg x 1.  Plan was transfer to [facility] however there were no beds available therefore she was admitted to hospitalist service with cardiology consultation.  [name] is followed by Dr. Haroldine Laws, last seen virtually 08/22/2019. She has a history of breast CA status post bilateral mastectomies treated with XRT, chemo currently on Kadcyla (trastuzamab-emantsine) every 3 weeks.  She underwent skin flap for skin metastasis on her abdomen 06/2013 with skin resection with chest skin flap on 02/21/2015 at [facility], required wound VAC.  Pathology with no recurrent malignancy.  She previously had issues with blood clots surrounding her Port-A-Cath therefore she was placed on Eliquis. Recently failed a research drug with progression of disease. Was getting famtratuzumab = deruxtecam but recently stopped due to pulmonary fibrosis and started on high-dose prednisone (50 tid) 2.5 weeks ago and home O2. Going back to Kadcyla  In 2018 she had a TKA-right which got infected requiring hardware removal with eventual replacement 2019.  She developed a hematoma and wound therefore chemotherapy was stopped for 2 months requiring a skin graft.  Unfortunately during chemo cessation she developed recurrent skin metastasis for which she is now struggling with.  She underwent chest CT in 2018  secondary to shortness of breath which showed no PE but evidence of possible PAH therefore V/Q was ordered with no PE.  In 2019 she underwent RHC which showed moderate PAH due to combination of high output and pulmonary arterioplasty at which time she was started on sildenafil.    Past Medical History:  Diagnosis Date  . Arthritis   . Breast cancer metastasized to skin, right [facility]) followed by dr force -- oncolgoist w/ [place]   primary left breast cancer dx 01/ 2009 ; 11/ 2009 recurrence left chest wall and neck, tx clinical trial drug and chemotherapy;  2015 recurrence cutaneous metastatized to right skin/ chest wall, 02/ 2015 resection chest wall disease and 02-23-2015 s/p skin flap surgery,  chemo every 3 weeks  . 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 Heart murmur   . 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 MVP (mitral valve prolapse)    mild mvp w/ mild regurg. per last echo 04-24-2017  . Neuropathy due to chemotherapeutic drug ([place])   . Non-healing surgical wound    right knee post re-implantation total knee arthroplasty 04-2017 unable to completely close surgical incision  . PONV (postoperative nausea and vomiting)    ponv likes scopolamine patch  . Port-A-Cath in place    RIGHT CHEST   . Red blood cell antibody positive   . Skin changes related to chemotherapy    per patient she has scabbed over skin circumventing port to right upper chest ;  state it is due to chemptherapy   . 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 APPLICATION OF A-CELL OF BACK Right 07/31/2016   Procedure: CELLERATE COLLAGEN PLACEMENT;  Surgeon: Loel Lofty Dillingham, DO;  Location: [place];  Service: Plastics;  Laterality: Right;  . APPLICATION OF WOUND VAC Right  06/29/2017   Procedure: APPLICATION OF WOUND VAC;  Surgeon: Wallace Going, DO;  Location: [facility];  Service: Plastics;  Laterality: Right;  . DEBRIDEMENT CHEST WALL RIGHT/ VAC PLACEMENT  02-09-2015    [facility]  . EXCISIONAL TOTAL KNEE ARTHROPLASTY WITH ANTIBIOTIC SPACERS Right 12/02/2016   Procedure: EXCISIONAL TOTAL KNEE ARTHROPLASTY WITH ANTIBIOTIC SPACERS;  Surgeon: Paralee Cancel, MD;  [facility];  Service: Orthopedics;  Laterality: Right;  90 mins  . HEMATOMA EVACUATION Right 06/29/2017   Procedure: EVACUATION HEMATOMA OF RIGHT KNEE;  Surgeon: Wallace Going, DO;  [facility];  Service: Plastics;  Laterality: Right;  . HEMATOMA EVACUATION Right 06/29/2017   Procedure: EVACUATION RIGHT TOTAL KNEE HEMATOMA;  Surgeon: Paralee Cancel, MD;  [facility];  Service: Orthopedics;  Laterality: Right;  . HEMATOMA EVACUATION Right 07/14/2017   Procedure: EVACUATION RIGHT LEG HEMATOMA WITH APPLICATION OF WOUND VAC;  Surgeon: Paralee Cancel, MD;  [facility];  Service: Orthopedics;  Laterality: Right;  . hemotoma     evacuation left chest wall  . I & D EXTREMITY Right 07/17/2017   Procedure: EVACUATION RIGHT LEG HEMATOMA WITH  WOUND VAC DRESSING CHANGE;  Surgeon: Paralee Cancel, MD;  [facility];  Service: Orthopedics;  Laterality: Right;  . I & D KNEE WITH POLY EXCHANGE Right 05/28/2016   Procedure: IRRIGATION AND DEBRIDEMENT KNEE WOUND VAC PLACMENT;  Surgeon: Susa Day, MD;  [facility];  Service: Orthopedics;  Laterality: Right;  . I & D KNEE WITH POLY EXCHANGE Right 05/30/2016   Procedure: RADICAL SYNOVECTOMY,IRRIGATION AND DEBRIDEMENT KNEE WITH POLY EXCHANGE WITH ANTIBIOTIC BEADS, APPLICATION OF WOUND VAC;  Surgeon: Susa Day, MD;  [facility];  Service: Orthopedics;  Laterality: Right;  . INCISION AND DRAINAGE OF WOUND Right 07/31/2016   Procedure: IRRIGATION AND DEBRIDEMENT RIGHT KNEE  WOUND;  Surgeon: Loel Lofty Dillingham, DO;  Location: [place];  Service: Plastics;   Laterality: Right;  . IR FLUORO GUIDE CV LINE LEFT  04/30/2017  . IR US GUIDE VASC ACCESS LEFT  04/30/2017  . IRRIGATION AND DEBRIDEMENT KNEE Right 04/12/2016   Procedure: IRRIGATION AND DEBRIDEMENT KNEE;  Surgeon: Nicholes Stairs, MD;  [facility];  Service: Orthopedics;  Laterality: Right;  . IRRIGATION AND DEBRIDEMENT KNEE Right 12/16/2016   Procedure: Repeat irrigation and debridement right knee, wound closure  wound vac REPLACEMENT ANTIBIOTIC SPACERS;  Surgeon: Paralee Cancel, MD;  [facility];  Service: Orthopedics;  Laterality: Right;  . KNEE ARTHROSCOPY  02/13/2012   Procedure: ARTHROSCOPY KNEE;  Surgeon: Johnn Hai, MD;  Location: [facility];  Service: Orthopedics;  Laterality: Left;  WITH DEBRIDEMENt   . KNEE ARTHROSCOPY WITH LATERAL MENISECTOMY  02/13/2012   Procedure: KNEE ARTHROSCOPY WITH LATERAL MENISECTOMY;  Surgeon: Johnn Hai, MD;  Location: [facility];  Service: Orthopedics;;  partial  . LATISSIMUS FLAP RIGHT CHEST  02-23-2015   [facility]  . LEFT MODIFIED RADICAL MASTECTOMY/ RIGHT TOTAL MASTECTOMY  11-13-2007   LEFT BREAST CANCER W/ AXILLARY LYMPH NODE METASTASIS AND POST NEOADJUVANT CHEMO  . MASS EXCISION Right 06/29/2017   Procedure: EXCISION OF METASTATIC BREAST CANCER TO SKIN RIGHT SHOULDER, PLACEMENT OF CELLERATE;  Surgeon: Wallace Going, DO;  [facility];  Service: Plastics;  Laterality: Right;  . PLACEMENT PORT-A-CATH  06/22/2007    new port placed 2015, right chest  . REIMPLANTATION OF TOTAL KNEE Right 04/27/2017   Procedure: Reimplantation of right total knee arthroplasty;  Surgeon: Paralee Cancel, MD;  [facility];  Service: Orthopedics;  Laterality: Right;  Adductor Block  . RESECTION OF ISOLATED CHEST WALL DISEASE/ ABDOMINAL SKIN FLAP  02/ 2015     [facility]   RIGHT CHEST WALL METASTATIC SKIN CARCINOMA  . RIGHT HEART CATH N/A 01/27/2018   Procedure: RIGHT HEART CATH;  Surgeon: Jolaine Artist, MD;  Location:  [place] [facility];  Service: Cardiovascular;  Laterality: N/A;  . SKIN SPLIT GRAFT Right 01/29/2017   Procedure: SKIN GRAFT SPLIT THICKNESS TO RIGHT KNEE WOUND;  Surgeon: Wallace Going, DO;  [facility];  Service: Plastics;  Laterality: Right;  . SKIN SPLIT GRAFT Right 09/07/2017   Procedure: SKIN GRAFT SPLIT THICKNESS TO RIGHT KNEE WOUND WITH PLACEMENT OF VAC DRESSING TO GRAFT SITE;  Surgeon: Wallace Going, DO;  Location: [place];  Service: Plastics;  Laterality: Right;  . TOTAL KNEE ARTHROPLASTY Left 09/23/2012   Procedure: LEFT TOTAL KNEE ARTHROPLASTY;  Surgeon: Johnn Hai, MD;  [facility];  Service: Orthopedics;  Laterality: Left;  . TOTAL KNEE ARTHROPLASTY Right 03/20/2016   Procedure: RIGHT TOTAL KNEE ARTHROPLASTY;  Surgeon: Dellis Filbert  Tonita Cong, MD;  [facility];  Service: Orthopedics;  Laterality: Right;  . TRANSTHORACIC ECHOCARDIOGRAM  04/24/2017   ef [id number], grade 1 diastolic dysfuction/  mild MR with mild prolapse anterior leaflet/ mild TR/ PASP 28mmHg     Prior to Admission medications   Medication Sig Start Date End Date Taking? Authorizing Provider  acetaminophen (TYLENOL) 500 MG tablet Take 500 mg by mouth every 8 (eight) hours as needed for mild pain or headache.   Yes [provider]  cefdinir (OMNICEF) 300 MG capsule Take 300 mg by mouth every 12 (twelve) hours. 09/04/19 09/09/19 Yes [provider]  celecoxib (CELEBREX) 200 MG capsule Take 200 mg by mouth daily as needed for mild pain.  02/19/15  Yes [provider]  cetirizine (ZYRTEC) 10 MG tablet Take 1 tablet (10 mg total) daily by mouth. 04/03/17  Yes Midge Minium, MD  Cholecalciferol (VITAMIN D3) 2000 UNITS TABS Take 2,000 Units by mouth 4 (four) times a week.    Yes [provider]  doxycycline (MONODOX) 100 MG capsule Take 100 mg by mouth 2 (two) times daily. 08/11/18  Yes [provider]  gabapentin (NEURONTIN) 300 MG capsule  Take 300 mg by mouth 3 (three) times daily.   Yes [provider]  mirabegron ER (MYRBETRIQ) 25 MG TB24 tablet Take 1 tablet (25 mg total) by mouth daily. 02/08/18  Yes Midge Minium, MD  omeprazole (PRILOSEC) 20 MG capsule Take 20 mg by mouth daily.   Yes [provider]  ondansetron (ZOFRAN) 8 MG tablet TAKE 1 TABLET BY MOUTH EVERY 8 HOURS AS NEEDED FOR NAUSEA. Patient taking differently: Take 8 mg by mouth every 8 (eight) hours as needed for nausea.  11/08/18  Yes Midge Minium, MD  polyethylene glycol powder (GLYCOLAX/MIRALAX) 17 GM/SCOOP powder Take 17 g by mouth 3 (three) times daily as needed. Patient taking differently: Take 17 g by mouth 3 (three) times daily as needed for mild constipation.  08/18/19  Yes Midge Minium, MD  potassium chloride SA (KLOR-CON) 20 MEQ tablet Take 2 tablets by mouth daily. 09/03/19 09/02/20 Yes [provider]  prednisoLONE acetate (PRED FORTE) 1 % ophthalmic suspension Place 1 drop into the left eye in the morning and at bedtime. Please begin medication after scheduled procedure.  08/15/19  Yes [provider]  sildenafil (REVATIO) 20 MG tablet Take 2 tablets (40 mg total) by mouth 3 (three) times daily. 08/04/19  Yes Bensimhon, Shaune Pascal, MD  torsemide (DEMADEX) 20 MG tablet Take 20 mg by mouth daily. Take additional dose of Torsemide if weight increases by 1-2Ib in a day or 5Ibs in a week. 09/03/19 09/02/20 Yes [provider]  dexamethasone (DECADRON) 4 MG tablet TAKE 2 TABLETS (8 MG TOTAL) BY MOUTH ONCE DAILY ON DAYS 2 & 3 FOLLOWING CHEMOTHERAPY 12/31/18   [provider]  ELIQUIS 5 MG TABS tablet TAKE 1 TABLET BY MOUTH TWICE A DAY Patient taking differently: Take 5 mg by mouth 2 (two) times daily.  04/11/19   Midge Minium, MD  spironolactone (ALDACTONE) 25 MG tablet Take 0.5 tablets (12.5 mg total) by mouth daily. 06/15/19   Bensimhon, Shaune Pascal, MD    Inpatient Medications: Scheduled  Meds: . Chlorhexidine Gluconate Cloth  6 each Topical Daily  . furosemide  80 mg Intravenous Q12H  . gabapentin  300 mg Oral TID  . guaiFENesin  600 mg Oral BID  . heparin  5,000 Units Subcutaneous Q8H  . methylPREDNISolone (  SOLU-MEDROL) injection  60 mg Intravenous Q12H  . mirabegron ER  25 mg Oral Daily  . pantoprazole  40 mg Oral Daily  . potassium chloride SA  40 mEq Oral Daily  . sildenafil  40 mg Oral TID  . sodium chloride flush  3 mL Intravenous Q12H   Continuous Infusions:  PRN Meds: acetaminophen **OR** acetaminophen, levalbuterol, ondansetron **OR** ondansetron (ZOFRAN) IV, polyethylene glycol, sodium chloride flush  Allergies:    Allergies  Allergen Reactions  . Penicillins Hives, Itching, Rash and Other (See Comments)    Patient has previously tolerated cefazolin, ceftriaxone, and cefepime  PATIENT HAS HAD A PCN REACTION WITH IMMEDIATE RASH, FACIAL/TONGUE/THROAT SWELLING, SOB, OR LIGHTHEADEDNESS WITH HYPOTENSION:  #  #  YES  #  #  Has patient had a PCN reaction causing severe rash involving mucus membranes or skin necrosis: No Has patient had a PCN reaction that required hospitalization No Has patient had a PCN reaction occurring within the last 10 years: yes Denies airway involvement   . Tape Hives and Other (See Comments)    Can tolerate paper and adhesive,  NO MEDIPORE TAPE  . Other Rash and Other (See Comments)    STERI STRIPS - Blisters    Social History:   Social History   Socioeconomic History  . Marital status: Married    Spouse name: Not on file  . Number of children: 3  . Years of education: Not on file  . Highest education level: Not on file  Occupational History  . 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 Smoking status: Never Smoker  . Smokeless tobacco: Never Used  Substance and Sexual Activity  . Alcohol use: No  . Drug use: No  . Sexual activity: Yes  Other Topics Concern  . Not on file  Social History Narrative   Caffeine  use: none   Regular exercise:  No   Married- lives with 65 year old daughter and her husband   Works as an Therapist, sports at the [employer] at [employer].         Social Determinants of Health   Financial Resource Strain:   . Difficulty of Paying Living Expenses:   Food Insecurity:   . Worried About Charity fundraiser in the Last Year:   . Arboriculturist in the Last Year:   Transportation Needs:   . Film/video editor (Medical):   [name] Lack of Transportation (Non-Medical):   Physical Activity:   . Days of Exercise per Week:   . Minutes of Exercise per Session:   Stress:   . Feeling of Stress :   Social Connections:   . Frequency of Communication with Friends and Family:   . Frequency of Social Gatherings with Friends and Family:   . Attends Religious Services:   . Active Member of Clubs or Organizations:   . Attends Archivist Meetings:   [name] Marital Status:   Intimate Partner Violence:   . Fear of Current or Ex-Partner:   . Emotionally Abused:   [name] Physically Abused:   . Sexually Abused:     Family History:   Family History  Problem Relation Age of Onset  . Heart disease Mother        due to mitral valve regurgiation,   . Cancer Mother        breast  . Allergic rhinitis Mother   . Parkinsonism Father   . Allergic rhinitis Father   . Migraines  Father   . Alcohol abuse Paternal Grandfather   . Angioedema Neg Hx   . Asthma Neg Hx   . Eczema Neg Hx    Family Status:  Family Status  Relation Name Status  . Mother  Deceased  . Father  Deceased  . PGF  Deceased  . MGM  Deceased  . MGF  Deceased  . PGM  Deceased  . Neg Hx  (Not Specified)    ROS:  Please see the history of present illness.  All other ROS reviewed and negative.     Physical Exam/Data:   Vitals:   09/09/19 1400 09/09/19 1442 09/09/19 1607 09/09/19 1611  BP: 111/66 133/70    Pulse: (!) 130 (!) 129 (!) 118 (!) 117  Resp: (!) 21 (!) 31 (!) 25 (!) 22  Temp:  98.8 F (37.1 C)    TempSrc:  Oral     SpO2: 94% 99% 95% 95%  Weight:      Height:        Intake/Output Summary (Last 24 hours) at 09/09/2019 1712 Last data filed at 09/09/2019 1611 Gross per 24 hour  Intake 632.29 ml  Output 500 ml  Net 132.29 ml   Filed Weights   09/08/19 2100  Weight: 86.6 kg   Body mass index is 29.9 kg/m.   General: Well developed, chronically ill appearing, NAD Skin: Warm, dry, intact  Head: Normal Neck: Negative for carotid bruits. JVD 12 cm Lungs: Diffuse crackles.  Status post bilateral mastectomies. Cardiovascular: Regular and tachycardic.  No murmurs. Abdomen: Soft, non-tender, non-distended with normoactive bowel sounds. No hepatomegaly, No rebound/guarding. No obvious abdominal masses. MSK: Strength and tone appear normal for age. 5/5 in all extremities Extremities: 3-4+ edema.  Ecchymosis noted knees bilaterally. Neuro: Alert and oriented. No focal deficits. No facial asymmetry. MAE spontaneously. Psych: Responds to questions appropriately with normal affect.     EKG:  The EKG was personally reviewed and demonstrates: Sinus tachycardia. Telemetry:  Telemetry was personally reviewed and demonstrates: Sinus tachycardia with rare PVC.  Relevant CV Studies:  Echocardiogram April 2021 at [facility] showed normal LV function, mild left ventricular hypertrophy, normal diastolic function, normal right ventricular function, moderate tricuspid regurgitation and trivial pericardial effusion.  Lower extremity venous Dopplers April 2021 [facility] showed no DVT.  RHC 01/27/18 RA = 5 RV = 68/8 PA = 67/28 (45) PCW = 6 Fick cardiac output/index = 6.8/3.6 Thermo CO/CI = 7.4/4.0 PVR = 5.7 WU Ao sat = 98% PA sat = 79%, 79% High SVC sat = 79%  Laboratory Data:  Chemistry Recent Labs  Lab 09/08/19 1410 09/09/19 0256  NA 133* 137  K 3.5 3.0*  CL 91* 93*  CO2 26 32  GLUCOSE 252* 164*  BUN 22 20  CREATININE 0.91 0.71  CALCIUM 8.4* 8.4*  GFRNONAA >60 >60  GFRAA >60 >60   ANIONGAP 16* 12    Total Protein  Date Value Ref Range Status  09/08/2019 5.1 (L) 6.5 - 8.1 g/dL Final   Albumin  Date Value Ref Range Status  09/08/2019 2.4 (L) 3.5 - 5.0 g/dL Final   AST  Date Value Ref Range Status  09/08/2019 41 15 - 41 U/L Final   ALT  Date Value Ref Range Status  09/08/2019 48 (H) 0 - 44 U/L Final   Alkaline Phosphatase  Date Value Ref Range Status  09/08/2019 183 (H) 38 - 126 U/L Final   Total Bilirubin  Date Value Ref Range Status  09/08/2019 2.4 (  H) 0.3 - 1.2 mg/dL Final   Hematology Recent Labs  Lab 09/08/19 1410 09/08/19 1443 09/09/19 0256  WBC 8.1 7.3 6.3  RBC 2.84* 2.81* 2.70*  HGB 11.0* 11.1* 10.4*  HCT 33.8* 33.4* 31.9*  MCV 119.0* 118.9* 118.1*  MCH 38.7* 39.5* 38.5*  MCHC 32.5 33.2 32.6  RDW 20.0* 19.9* 20.0*  PLT 137* 123* 128*   BNP Recent Labs  Lab 09/08/19 1443  BNP 761.6*    TSH:  Lab Results  Component Value Date   TSH 1.50 02/25/2018   Lipids: Lab Results  Component Value Date   CHOL 183 02/25/2018   HDL 92.20 02/25/2018   LDLCALC 76 02/25/2018   TRIG 73.0 02/25/2018   CHOLHDL 2 02/25/2018   Radiology/Studies:  CT Angio Chest PE W and/or Wo Contrast  Result Date: 09/08/2019 CLINICAL DATA:  Shortness of breath, history metastatic breast cancer, fluid overload, interstitial lung disease EXAM: CT ANGIOGRAPHY CHEST WITH CONTRAST TECHNIQUE: Multidetector CT imaging of the chest was performed using the standard protocol during bolus administration of intravenous contrast. Multiplanar CT image reconstructions and MIPs were obtained to evaluate the vascular anatomy. CONTRAST:  134mL OMNIPAQUE IOHEXOL 350 MG/ML SOLN COMPARISON:  05/07/2017 FINDINGS: Cardiovascular: Satisfactory opacification of the pulmonary arteries to the segmental level. No evidence of pulmonary embolism. Cardiomegaly. Enlargement of the main pulmonary artery, measuring up to 3.8 cm. No pericardial effusion. Mediastinum/Nodes: No enlarged  mediastinal, hilar, or axillary lymph nodes. Thyroid gland, trachea, and esophagus demonstrate no significant findings. Lungs/Pleura: There is extensive bilateral, somewhat geographic heterogeneous and ground-glass airspace opacity. 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 No pleural effusion or pneumothorax. Upper Abdomen: No acute abnormality. Musculoskeletal: Status post bilateral mastectomy. No acute or significant osseous findings. Review of the MIP images confirms the above findings. IMPRESSION: 1.  Negative examination for pulmonary embolism. 2. Extensive bilateral, somewhat geographic heterogeneous and ground-glass airspace opacity, consistent with multifocal infection, including COVID-19 if clinically suspected. 3. 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 There is additional radiation fibrosis of the anterior left lung similar to prior examination. 4.  Cardiomegaly. 5. Enlargement of the main pulmonary artery, as can be seen in pulmonary hypertension. Electronically Signed   By: Eddie Candle M.D.   On: 09/08/2019 20:38   DG Chest Portable 1 View  Result Date: 09/08/2019 CLINICAL DATA:  Shortness of breath.  History of breast carcinoma EXAM: PORTABLE CHEST 1 VIEW COMPARISON:  Oct 20, 2018 FINDINGS: Port-A-Cath tip is in the right atrium slightly beyond the cavoatrial junction level. No pneumothorax. There is widespread airspace opacity throughout the left lung diffusely as well as in the right upper lobe and to a lesser extent in the right base. Heart size is within normal limits. Pulmonary vascularity is stable with distortion on the left. Soft tissue prominence in the left  hilar region is stable and potentially may represent a degree of underlying adenopathy. Postoperative changes noted in each axillary region with evidence of bilateral mastectomies. No bone lesions. IMPRESSION: Widespread airspace opacity bilaterally. Suspect widespread pneumonia. It should be noted that underlying lymphangitic spread of tumor may be present. Both entities may be present concurrently. There is apparent persistent scarring with volume loss left upper lobe region. Soft tissue  prominence in this area is stable and likely due to scarring; a degree of superimposed adenopathy in this area cannot be excluded by radiography. Port-A-Cath tip is in the right atrium slightly beyond the cavoatrial junction. Bilateral mastectomies noted. Electronically Signed   By: Lowella Grip III M.D.   On: 09/08/2019 15:11   Assessment and Plan:   1.  Acute on chronic respiratory failure with fluid volume overload secondary to chemotherapy induced ILD and pulmonary hypertension: -Patient presented after a 2-week hospitalization at [facility] for fluid volume overload treated with IV diuretics, prednisone and was discharged home on torsemide and supplemental O2 -Patient reports she has had progressive shortness of breath over the last several days requiring up titration of home oxygen with poor SPO2 at 70% -Initial ED presentation was stable on 12 L nonrebreather at which time she was given IV Lasix and started on IV Solu-Medrol -Weight, -I&O, -Echocardiogram from 08/2019 with EF at [id number], normal diastolic function and moderate TR.    3.  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  Most recently placed on trastuzumab deruxtecanrecent which was discontinued secondary to medication induced interstitial lung  disease. Now switching back to Kadcyla. She follows with oncology at [facility] 1/21 EF 60-65%   4. Thromboembolic disorder: -Previously on Eliquis therapy secondary to Port-A-Cath associated clot in 2010 however she was recently taken off Eliquis during hospital course at [facility] secondary to thrombocytopenia which is now resolved -CTA   Principal Problem:   Acute on chronic respiratory failure with hypoxia (HCC) Active Problems:   Hypokalemia   Sinus tachycardia   Pulmonary hypertension ([place])   Thromboembolic disorder ([place])   ILD (interstitial lung disease) ([place])   Acute right-sided CHF (congestive heart failure) ([place])   HCAP (healthcare-associated pneumonia)   For questions or updates, please contact [facility] Please consult [email or web address] for contact info under Cardiology/STEMI.   SignedKathyrn Drown NP-C [facility] Pager: [phone] 09/09/2019 5:12 PM  As above, patient seen and examined.  Briefly she is a 65 year old female with past medical history of inflammatory breast cancer status post bilateral mastectomies, XRT and chemotherapy, skin metastases, trastuzumab deruxtecan induced interstitial lung disease, pulmonary hypertension for evaluation of acute on chronic diastolic congestive heart failure.  Patient recently developed interstitial lung disease related to chemotherapeutic agent.  This was therefore discontinued and she was treated with prednisone.  With addition of prednisone she developed worsening dyspnea and severe volume excess.  She was hospitalized at [facility] and all records not available.  She was diuresed with improvement but continued to have peripheral edema at time of discharge approximately 6 days ago.  Note echocardiogram showed normal LV function.  After arriving home she noted increasing dyspnea, low oxygen saturations associated with confusion and worsening lower extremity edema.  She denies  fevers, chills, productive cough, hemoptysis or chest pain.  She was admitted and cardiology now asked to evaluate.  On exam she has diffuse crackles.  She has 3-4+ edema in her lower extremities.  CTA shows no pulmonary embolus.  There are fibrotic changes and diffuse infiltrates as well as pulmonary artery enlargement.  Sodium 137, potassium 3, BUN 20 and creatinine 0.71.  Albumin 2.4.  BNP 761.  Hemoglobin 10.4 and platelet count 128.  Electrocardiogram shows sinus tachycardia, right axis deviation and right atrial  enlargement.  1 acute on chronic diastolic congestive heart failure-patient is markedly volume overloaded.  Her symptoms began with recent initiation of prednisone.  She improved at [facility] with IV diuresis but had residual volume at time of discharge.  She has again developed recurrent edema and we will begin Lasix 80 mg IV twice daily.  Recent echocardiogram showed preserved LV function.  There is likely a component of right-sided congestive heart failure from pulmonary hypertension.  2 pulmonary hypertension-felt secondary to high output and pulmonary arteriopathy.  Continue sildenafil.  3 acute on chronic respiratory failure-likely multifactorial including acute on chronic diastolic congestive heart failure, pulmonary fibrosis (chemo induced) and probable inflammatory component from recent reaction to chemotherapeutic agent.  We will diurese and continue steroids.  She is also on antibiotics for possible pneumonia.  Lymphangitic spread less likely.  4 metastatic breast cancer  5 hypokalemia-supplement  Kirk Ruths, MD

## 2019-09-09 NOTE — Plan of Care (Signed)
Plan of care reviewed with pt at bedside. HR elevated, on 10L HF oxygen. Skin assessment complete with Gordy Levan. Scars noted, right skin tear on leg covered with gauze from cat scratch, legs edematous and ecchymotic, redness on back blanchable. Right double lumen port in place. SCDs on. Call bell in reach. Pt stable at this time, will continue to monitor.  Problem: Education: Goal: Knowledge of General Education information will improve Description: Including pain rating scale, medication(s)/side effects and non-pharmacologic comfort measures Outcome: Progressing   Problem: Health Behavior/Discharge Planning: Goal: Ability to manage health-related needs will improve Outcome: Progressing   Problem: Clinical Measurements: Goal: Ability to maintain clinical measurements within normal limits will improve Outcome: Progressing Goal: Will remain free from infection Outcome: Progressing Goal: Diagnostic test results will improve Outcome: Progressing Goal: Respiratory complications will improve Outcome: Progressing Goal: Cardiovascular complication will be avoided Outcome: Progressing   Problem: Activity: Goal: Risk for activity intolerance will decrease Outcome: Progressing   Problem: Nutrition: Goal: Adequate nutrition will be maintained Outcome: Progressing   Problem: Coping: Goal: Level of anxiety will decrease Outcome: Progressing   Problem: Elimination: Goal: Will not experience complications related to bowel motility Outcome: Progressing Goal: Will not experience complications related to urinary retention Outcome: Progressing   Problem: Pain Managment: Goal: General experience of comfort will improve Outcome: Progressing   Problem: Safety: Goal: Ability to remain free from injury will improve Outcome: Progressing   Problem: Skin Integrity: Goal: Risk for impaired skin integrity will decrease Outcome: Progressing

## 2019-09-09 NOTE — Progress Notes (Signed)
PROGRESS NOTE    [name]  [id number] DOB: 03-Dec-1955 DOA: 09/08/2019 PCP: Midge Minium, MD    Brief Narrative:  64 year old female with a history of right-sided heart failure, breast cancer on chemotherapy, interstitial lung disease secondary to chemotherapy, recently discharged on 4/11 at [facility] after being treated for volume overload and shortness of breath.  She was aggressively diuresed during her hospital stay.  She was discharged on 4 L of oxygen.  Upon returning home, she had worsening shortness of breath as well as her extremity edema.  She comes back to the hospital with volume overload.  CT imaging indicated possible pneumonia.  She has been started on antibiotics.  Cardiology consulted.  Case was reviewed with team at [facility] who is accepted her in transfer.  Currently, no beds are available but she is on the waiting list.   Assessment & Plan:   Principal Problem:   Acute on chronic respiratory failure with hypoxia ([place]) Active Problems:   Pulmonary hypertension (HCC)   Thromboembolic disorder ([place])   ILD (interstitial lung disease) ([place])   1. Acute respiratory failure with hypoxia secondary to right sided heart failure as well as possible pneumonia.  Patient was recently started on oxygen during her last admission at [facility].  She was discharged on 4 L of oxygen on 4/11.  She reports that since she returned home, her shortness of breath has worsened.  She has noticed worsening lower extremity edema.  On admission, she required nonrebreather.  This has since been weaned down to 8 to 10 L on high flow nasal cannula.  We will continue to wean down oxygen as tolerated. 2. Healthcare associated pneumonia.  CT chest was negative for pulmonary embolus.  It did comment on possible underlying infection.  She has not had any fever, but does have a nonproductive cough.  Due to her immunocompromised status and recent hospitalization, we will start her  on intravenous antibiotics for now. 3. Interstitial lung disease.  Related to recent chemotherapy.  Started on high-dose prednisone for pulmonary fibrosis.  Discussed with primary oncologist who recommended to continue on high-dose steroids for now.  We will continue on equivalent dose of Solu-Medrol. 4. Acute on chronic right-sided heart failure.  Secondary to pulmonary hypertension from interstitial lung disease.  Patient had an extensive hospitalization at [facility] where she was aggressively diuresed.  Discussed with her primary oncologist who reported that diuresis was complicated by development of hypotension.  She is chronically followed in the heart failure clinic.  Currently on 80 of IV Lasix twice a day.  She was recently discharged on torsemide 20 mg daily.  Will request cardiology assistance to further assist in diuresis. 5. Hypokalemia.  Replace.  Magnesium was also low and will be replaced 6. Inflammatory breast cancer involving chest wall.  She is followed by oncology at [facility].  Does not appear to have any further metastatic deposits other than chest wall. 7. Sinus tachycardia.  Suspect this is reactive to underlying pulmonary process.  She is currently afebrile.  Does not appear to be in acute pain.  CT chest was negative for pulmonary embolus.  Blood pressure stable.   DVT prophylaxis: heparin SQ Code Status: full code Family Communication: discussed with daughter at the bedside Disposition Plan:  Status is: Inpatient  Remains inpatient appropriate because:IV treatments appropriate due to intensity of illness or inability to take PO   Dispo: The patient is from: Home  Anticipated d/c is to: [facility]              Anticipated d/c date is: 1-2 days pending on bed availability              Patient currently is not medically stable to d/c.   Consultants:   Cardiology  Procedures:   Echocardiogram  Antimicrobials:   Vancomycin  4/16 >  Cefepime 4/16>   Subjective: Still feels short of breath, mildly improved since admission.  She is no longer on nonrebreather.  Does not feel back to baseline.  She has a dry cough.  Complains of worsening edema in her lower extremities.  Objective: Vitals:   09/09/19 1330 09/09/19 1400 09/09/19 1442 09/09/19 1611  BP: (!) 132/57 111/66 133/70   Pulse: (!) 127 (!) 130 (!) 129 (!) 117  Resp: (!) 27 (!) 21 (!) 31 (!) 22  Temp:   98.8 F (37.1 C)   TempSrc:   Oral   SpO2: 96% 94% 99% 95%  Weight:      Height:        Intake/Output Summary (Last 24 hours) at 09/09/2019 1630 Last data filed at 09/09/2019 1611 Gross per 24 hour  Intake 632.29 ml  Output 1400 ml  Net -767.71 ml   Filed Weights   09/08/19 2100  Weight: 86.6 kg    Examination:   General exam: Appears calm and comfortable  Respiratory system: Coarse breath sounds bilaterally. Respiratory effort normal. Cardiovascular system: S1 & S2 heard, regular, tachycardic. No murmurs, rubs, gallops or clicks.  Gastrointestinal system: Abdomen is nondistended, soft and nontender. No organomegaly or masses felt. Normal bowel sounds heard. Central nervous system: Alert and oriented. No focal neurological deficits. Extremities: 2-3+ edema bilaterally Skin: No rashes, lesions or ulcers Psychiatry: Judgement and insight appear normal. Mood & affect appropriate.     Data Reviewed: I have personally reviewed following labs and imaging studies  CBC: Recent Labs  Lab 09/08/19 1410 09/08/19 1443 09/09/19 0256  WBC 8.1 7.3 6.3  NEUTROABS  --  6.8  --   HGB 11.0* 11.1* 10.4*  HCT 33.8* 33.4* 31.9*  MCV 119.0* 118.9* 118.1*  PLT 137* 123* 0000000*   Basic Metabolic Panel: Recent Labs  Lab 09/08/19 1410 09/09/19 0256  NA 133* 137  K 3.5 3.0*  CL 91* 93*  CO2 26 32  GLUCOSE 252* 164*  BUN 22 20  CREATININE 0.91 0.71  CALCIUM 8.4* 8.4*  MG  --  1.6*   GFR: Estimated Creatinine Clearance: 81.4 mL/min (by C-G  formula based on SCr of 0.71 mg/dL). Liver Function Tests: Recent Labs  Lab 09/08/19 1648  AST 41  ALT 48*  ALKPHOS 183*  BILITOT 2.4*  PROT 5.1*  ALBUMIN 2.4*   No results for input(s): LIPASE, AMYLASE in the last 168 hours. No results for input(s): AMMONIA in the last 168 hours. Coagulation Profile: No results for input(s): INR, PROTIME in the last 168 hours. Cardiac Enzymes: No results for input(s): CKTOTAL, CKMB, CKMBINDEX, TROPONINI in the last 168 hours. BNP (last 3 results) No results for input(s): PROBNP in the last 8760 hours. HbA1C: No results for input(s): HGBA1C in the last 72 hours. CBG: No results for input(s): GLUCAP in the last 168 hours. Lipid Profile: No results for input(s): CHOL, HDL, LDLCALC, TRIG, CHOLHDL, LDLDIRECT in the last 72 hours. Thyroid Function Tests: No results for input(s): TSH, T4TOTAL, FREET4, T3FREE, THYROIDAB in the last 72 hours. Anemia Panel: No results for input(s):  VITAMINB12, FOLATE, FERRITIN, TIBC, IRON, RETICCTPCT in the last 72 hours. Sepsis Labs: Recent Labs  Lab 09/08/19 1443 09/08/19 2004  LATICACIDVEN 3.4* 2.0*    Recent Results (from the past 240 hour(s))  Respiratory Panel by RT PCR (Flu A&B, Covid) - Nasopharyngeal Swab     Status: None   Collection Time: 09/08/19  2:59 PM   Specimen: Nasopharyngeal Swab  Result Value Ref Range Status   SARS Coronavirus 2 by RT PCR NEGATIVE NEGATIVE Final    Comment: (NOTE) SARS-CoV-2 target nucleic acids are NOT DETECTED. The SARS-CoV-2 RNA is generally detectable in upper respiratoy specimens during the acute phase of infection. The lowest concentration of SARS-CoV-2 viral copies this assay can detect is 131 copies/mL. A negative result does not preclude SARS-Cov-2 infection and should not be used as the sole basis for treatment or other patient management decisions. A negative result may occur with  improper specimen collection/handling, submission of specimen other than  nasopharyngeal swab, presence of viral mutation(s) within the areas targeted by this assay, and inadequate number of viral copies (<131 copies/mL). A negative result must be combined with clinical observations, patient history, and epidemiological information. The expected result is Negative. Fact Sheet for Patients:  PinkCheek.be Fact Sheet for Healthcare Providers:  GravelBags.it This test is not yet ap proved or cleared by the [place] FDA and  has been authorized for detection and/or diagnosis of SARS-CoV-2 by FDA under an Emergency Use Authorization (EUA). This EUA will remain  in effect (meaning this test can be used) for the duration of the COVID-19 declaration under Section 564(b)(1) of the Act, 21 U.S.C. section 360bbb-3(b)(1), unless the authorization is terminated or revoked sooner.    Influenza A by PCR NEGATIVE NEGATIVE Final   Influenza B by PCR NEGATIVE NEGATIVE Final    Comment: (NOTE) The Xpert Xpress SARS-CoV-2/FLU/RSV assay is intended as an aid in  the diagnosis of influenza from Nasopharyngeal swab specimens and  should not be used as a sole basis for treatment. Nasal washings and  aspirates are unacceptable for Xpert Xpress SARS-CoV-2/FLU/RSV  testing. Fact Sheet for Patients: PinkCheek.be Fact Sheet for Healthcare Providers: GravelBags.it This test is not yet approved or cleared by the [place] FDA and  has been authorized for detection and/or diagnosis of SARS-CoV-2 by  FDA under an Emergency Use Authorization (EUA). This EUA will remain  in effect (meaning this test can be used) for the duration of the  Covid-19 declaration under Section 564(b)(1) of the Act, 21  U.S.C. section 360bbb-3(b)(1), unless the authorization is  terminated or revoked. Performed at [facility], [place] [address] [place], [place] [address]   Blood  culture (routine x 2)     Status: None (Preliminary result)   Collection Time: 09/08/19  8:49 PM   Specimen: BLOOD RIGHT WRIST  Result Value Ref Range Status   Specimen Description BLOOD RIGHT WRIST  Final   Special Requests   Final    BOTTLES DRAWN AEROBIC AND ANAEROBIC Blood Culture adequate volume   Culture   Final    NO GROWTH < 24 HOURS Performed at [facility], [place] [address] [place], [place] [address]    Report Status PENDING  Incomplete  Blood culture (routine x 2)     Status: None (Preliminary result)   Collection Time: 09/08/19  8:54 PM   Specimen: BLOOD RIGHT ARM  Result Value Ref Range Status   Specimen Description BLOOD RIGHT ARM  Final   Special  Requests   Final    BOTTLES DRAWN AEROBIC AND ANAEROBIC Blood Culture adequate volume   Culture   Final    NO GROWTH < 24 HOURS Performed at [facility], [place] [address] [place], [place] [address]    Report Status PENDING  Incomplete         Radiology Studies: CT Angio Chest PE W and/or Wo Contrast  Result Date: 09/08/2019 CLINICAL DATA:  Shortness of breath, history metastatic breast cancer, fluid overload, interstitial lung disease EXAM: CT ANGIOGRAPHY CHEST WITH CONTRAST TECHNIQUE: Multidetector CT imaging of the chest was performed using the standard protocol during bolus administration of intravenous contrast. Multiplanar CT image reconstructions and MIPs were obtained to evaluate the vascular anatomy. CONTRAST:  133mL OMNIPAQUE IOHEXOL 350 MG/ML SOLN COMPARISON:  05/07/2017 FINDINGS: Cardiovascular: Satisfactory opacification of the pulmonary arteries to the segmental level. No evidence of pulmonary embolism. Cardiomegaly. Enlargement of the main pulmonary artery, measuring up to 3.8 cm. No pericardial effusion. Mediastinum/Nodes: No enlarged mediastinal, hilar, or axillary lymph nodes. Thyroid gland, trachea, and esophagus demonstrate no significant findings. Lungs/Pleura: There is extensive bilateral,  somewhat geographic heterogeneous and ground-glass airspace opacity. 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 No pleural effusion or pneumothorax. Upper Abdomen: No acute abnormality. Musculoskeletal: Status post bilateral mastectomy. No acute or significant osseous findings. Review of the MIP images confirms the above findings. IMPRESSION: 1.  Negative examination for pulmonary embolism. 2. Extensive bilateral, somewhat geographic heterogeneous and ground-glass airspace opacity, consistent with multifocal infection, including COVID-19 if clinically suspected. 3. 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 There is additional radiation fibrosis of the anterior left lung similar to prior examination. 4.  Cardiomegaly. 5. Enlargement of the main pulmonary artery, as can be seen in pulmonary hypertension. Electronically Signed   By: Eddie Candle M.D.   On: 09/08/2019 20:38   DG Chest Portable 1 View  Result Date: 09/08/2019 CLINICAL DATA:  Shortness of breath.  History of breast carcinoma EXAM: PORTABLE CHEST 1 VIEW COMPARISON:  Oct 20, 2018 FINDINGS: Port-A-Cath tip is in the right atrium slightly beyond the cavoatrial junction level. No pneumothorax. There is widespread airspace opacity throughout the left lung diffusely as well as in the right upper lobe and to a lesser extent in the right base. Heart size is within normal limits. Pulmonary vascularity is stable with distortion on the left. Soft tissue prominence in the left hilar region is stable and potentially may represent a degree of underlying adenopathy. Postoperative changes noted in each axillary region with evidence of  bilateral mastectomies. No bone lesions. IMPRESSION: Widespread airspace opacity bilaterally. Suspect widespread pneumonia. It should be noted that underlying lymphangitic spread of tumor may be present. Both entities may be present concurrently. There is apparent persistent scarring with volume loss left upper lobe region. Soft tissue prominence in this area is stable and likely due to scarring; a degree of superimposed adenopathy in this area cannot be excluded by radiography. Port-A-Cath tip is in the right atrium slightly beyond the cavoatrial junction. Bilateral mastectomies noted. Electronically Signed   By: Lowella Grip III M.D.   On: 09/08/2019 15:11        Scheduled Meds: . Chlorhexidine Gluconate Cloth  6  each Topical Daily  . furosemide  80 mg Intravenous Q12H  . gabapentin  300 mg Oral TID  . heparin  5,000 Units Subcutaneous Q8H  . methylPREDNISolone (SOLU-MEDROL) injection  40 mg Intravenous Q12H  . metoprolol tartrate  5 mg Intravenous Q6H  . mirabegron ER  25 mg Oral Daily  . pantoprazole  40 mg Oral Daily  . potassium chloride SA  40 mEq Oral Daily  . sildenafil  40 mg Oral TID  . sodium chloride flush  3 mL Intravenous Q12H   Continuous Infusions:   LOS: 0 days    Time spent: 36mins    Kathie Dike, MD [facility]   If 7PM-7AM, please contact night-coverage [email or web address]  09/09/2019, 4:30 PM

## 2019-09-09 NOTE — Discharge Summary (Signed)
Physician Discharge Summary  [name] [id number] DOB: 1955/11/05 DOA: 09/08/2019  PCP: Midge Minium, MD  Admit date: 09/08/2019 Discharge date: 09/09/2019  Admitted From: Home Disposition: Transfer to [facility]  Recommendations for Outpatient Follow-up:  1. 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  She was aggressively diuresed during her hospital stay.  She was discharged on 4 L of oxygen.  Upon returning home, she had worsening shortness of breath as well as her extremity edema.  She comes back to the hospital with volume overload.  CT imaging indicated possible pneumonia.  She has been started on IV antibiotics.  Cardiology consulted and agreed with continued diuresis.  Case was reviewed with team at [facility] who has accepted her in transfer.    Plan will be to transfer there later this evening  Discharge Diagnoses:  Principal Problem:   Acute on chronic respiratory failure with hypoxia (HCC) Active Problems:   Hypokalemia   Sinus tachycardia   Pulmonary hypertension (HCC)   Thromboembolic disorder (HCC)   ILD (interstitial lung disease) ([place])   Acute right-sided CHF (congestive heart failure) ([place])   HCAP (healthcare-associated pneumonia)  1. Acute respiratory failure with hypoxia secondary to right sided heart failure as well as possible pneumonia.  Patient was recently started on oxygen during her last admission at [facility].  She was discharged on 4 L of oxygen on 4/11.  She reports that since she returned home, her shortness of breath has  worsened.  She has noticed worsening lower extremity edema.  On admission, she required nonrebreather.  This has since been weaned down to 8 to 10 L on high flow nasal cannula.  We will continue to wean down oxygen as tolerated. 2. Healthcare associated pneumonia.  CT chest was negative for pulmonary embolus.  It did comment on possible underlying infection.  She has not had any fever, but does have a nonproductive cough.  Due to her immunocompromised status and recent hospitalization, we will start her on intravenous antibiotics with vancomycin and cefepime for now. 3. Interstitial lung disease.  Related to recent chemotherapy.  Started on high-dose prednisone for pulmonary fibrosis during her last admission.  Discussed with primary oncologist who recommended to continue on high-dose steroids for now.  We will continue on equivalent dose of Solu-Medrol. 4. Acute on chronic right-sided heart failure.  Secondary to pulmonary hypertension from interstitial lung disease.  Patient had an extensive hospitalization at [facility] where she was aggressively diuresed.  Discussed with her primary oncologist who reported that diuresis was complicated by development of hypotension.    Currently on Lasix 80 mg IV twice daily.  Cardiology consulted and agreed with continued diuresis since she is markedly volume overloaded. 5. Hypokalemia.  Replace.  Magnesium was also low and will be replaced 6. Inflammatory breast cancer involving chest wall.  She is followed by oncology at [facility].  Does not appear to have any further metastatic deposits other than chest wall. 7. Sinus tachycardia.  Suspect this is reactive to underlying pulmonary process.  She is currently afebrile.  Does not appear to be in acute pain.  CT chest was negative for pulmonary embolus.  Blood  pressure stable.  Discharge Instructions  Discharge Instructions    Diet - low sodium heart healthy   Complete by: As directed    Increase activity  slowly   Complete by: As directed      Allergies as of 09/09/2019      Reactions   Penicillins Hives, Itching, Rash, Other (See Comments)   Patient has previously tolerated cefazolin, ceftriaxone, and cefepime PATIENT HAS HAD A PCN REACTION WITH IMMEDIATE RASH, FACIAL/TONGUE/THROAT SWELLING, SOB, OR LIGHTHEADEDNESS WITH HYPOTENSION:  #  #  YES  #  #  Has patient had a PCN reaction causing severe rash involving mucus membranes or skin necrosis: No Has patient had a PCN reaction that required hospitalization No Has patient had a PCN reaction occurring within the last 10 years: yes Denies airway involvement   Tape Hives, Other (See Comments)   Can tolerate paper and adhesive,  NO MEDIPORE TAPE   Other Rash, Other (See Comments)   STERI STRIPS - Blisters      Medication List    STOP taking these medications   cefdinir 300 MG capsule Commonly known as: OMNICEF   celecoxib 200 MG capsule Commonly known as: CELEBREX   doxycycline 100 MG capsule Commonly known as: MONODOX   torsemide 20 MG tablet Commonly known as: DEMADEX     TAKE these medications   acetaminophen 500 MG tablet Commonly known as: TYLENOL Take 500 mg by mouth every 8 (eight) hours as needed for mild pain or headache.   ceFEPIme 2 g in sodium chloride 0.9 % 100 mL Inject 2 g into the vein every 8 (eight) hours. Start taking on: September 10, 2019   cetirizine 10 MG tablet Commonly known as: ZYRTEC Take 1 tablet (10 mg total) daily by mouth.   dexamethasone 4 MG tablet Commonly known as: DECADRON TAKE 2 TABLETS (8 MG TOTAL) BY MOUTH ONCE DAILY ON DAYS 2 & 3 FOLLOWING CHEMOTHERAPY   Eliquis 5 MG Tabs tablet Generic drug: apixaban TAKE 1 TABLET BY MOUTH TWICE A DAY What changed: how much to take   furosemide 10 MG/ML injection Commonly known as: LASIX Inject 8 mLs (80 mg total) into the vein every 12 (twelve) hours. Start taking on: September 10, 2019   gabapentin 300 MG capsule Commonly known as:  NEURONTIN Take 300 mg by mouth 3 (three) times daily.   levalbuterol 0.63 MG/3ML nebulizer solution Commonly known as: XOPENEX Take 3 mLs (0.63 mg total) by nebulization every 6 (six) hours as needed for wheezing or shortness of breath.   methylPREDNISolone sodium succinate 125 mg/2 mL injection Commonly known as: SOLU-MEDROL Inject 0.96 mLs (60 mg total) into the vein every 12 (twelve) hours. Start taking on: September 10, 2019   mirabegron ER 25 MG Tb24 tablet Commonly known as: Myrbetriq Take 1 tablet (25 mg total) by mouth daily.   omeprazole 20 MG capsule Commonly known as: PRILOSEC Take 20 mg by mouth daily.   ondansetron 8 MG tablet Commonly known as: ZOFRAN TAKE 1 TABLET BY MOUTH EVERY 8 HOURS AS NEEDED FOR NAUSEA. What changed:   reasons to take this  additional instructions   polyethylene glycol powder 17 GM/SCOOP powder Commonly known as: GLYCOLAX/MIRALAX Take 17 g by mouth 3 (three) times daily as needed. What changed: reasons to take this   potassium chloride SA 20 MEQ tablet Commonly known as: KLOR-CON Take 2 tablets by mouth daily.   prednisoLONE acetate 1 % ophthalmic suspension Commonly known as: PRED FORTE Place 1 drop  into the left eye in the morning and at bedtime. Please begin medication after scheduled procedure.   sildenafil 20 MG tablet Commonly known as: REVATIO Take 2 tablets (40 mg total) by mouth 3 (three) times daily.   spironolactone 25 MG tablet Commonly known as: Aldactone Take 0.5 tablets (12.5 mg total) by mouth daily.   vancomycin 1-5 GM/200ML-% Soln Commonly known as: VANCOCIN Inject 200 mLs (1,000 mg total) into the vein every 12 (twelve) hours.   Vitamin D3 50 MCG (2000 UT) Tabs Take 2,000 Units by mouth 4 (four) times a week.       Allergies  Allergen Reactions  . Penicillins Hives, Itching, Rash and Other (See Comments)    Patient has previously tolerated cefazolin, ceftriaxone, and cefepime  PATIENT HAS HAD A PCN  REACTION WITH IMMEDIATE RASH, FACIAL/TONGUE/THROAT SWELLING, SOB, OR LIGHTHEADEDNESS WITH HYPOTENSION:  #  #  YES  #  #  Has patient had a PCN reaction causing severe rash involving mucus membranes or skin necrosis: No Has patient had a PCN reaction that required hospitalization No Has patient had a PCN reaction occurring within the last 10 years: yes Denies airway involvement   . Tape Hives and Other (See Comments)    Can tolerate paper and adhesive,  NO MEDIPORE TAPE  . Other Rash and Other (See Comments)    STERI STRIPS - Blisters    Consultations:  cardiology   Procedures/Studies: CT Angio Chest PE W and/or Wo Contrast  Result Date: 09/08/2019 CLINICAL DATA:  Shortness of breath, history metastatic breast cancer, fluid overload, interstitial lung disease EXAM: CT ANGIOGRAPHY CHEST WITH CONTRAST TECHNIQUE: Multidetector CT imaging of the chest was performed using the standard protocol during bolus administration of intravenous contrast. Multiplanar CT image reconstructions and MIPs were obtained to evaluate the vascular anatomy. CONTRAST:  138mL OMNIPAQUE IOHEXOL 350 MG/ML SOLN COMPARISON:  05/07/2017 FINDINGS: Cardiovascular: Satisfactory opacification of the pulmonary arteries to the segmental level. No evidence of pulmonary embolism. Cardiomegaly. Enlargement of the main pulmonary artery, measuring up to 3.8 cm. No pericardial effusion. Mediastinum/Nodes: No enlarged mediastinal, hilar, or axillary lymph nodes. Thyroid gland, trachea, and esophagus demonstrate no significant findings. Lungs/Pleura: There is extensive bilateral, somewhat geographic heterogeneous and ground-glass airspace opacity. 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 No pleural effusion or pneumothorax. Upper Abdomen: No acute abnormality. Musculoskeletal: Status post bilateral  mastectomy. No acute or significant osseous findings. Review of the MIP images confirms the above findings. IMPRESSION: 1.  Negative examination for pulmonary embolism. 2. Extensive bilateral, somewhat geographic heterogeneous and ground-glass airspace opacity, consistent with multifocal infection, including COVID-19 if clinically suspected. 3. 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 There is additional radiation fibrosis of the anterior left lung similar to prior examination. 4.  Cardiomegaly. 5. Enlargement of the main pulmonary artery, as can be seen in pulmonary hypertension. Electronically Signed   By: Eddie Candle M.D.   On: 09/08/2019 20:38   DG Chest Portable 1 View  Result Date: 09/08/2019 CLINICAL DATA:  Shortness of breath.  History of breast carcinoma EXAM: PORTABLE CHEST 1 VIEW COMPARISON:  Oct 20, 2018 FINDINGS: Port-A-Cath tip is in the right atrium slightly beyond the cavoatrial junction level. No pneumothorax. There is  widespread airspace opacity throughout the left lung diffusely as well as in the right upper lobe and to a lesser extent in the right base. Heart size is within normal limits. Pulmonary vascularity is stable with distortion on the left. Soft tissue prominence in the left hilar region is stable and potentially may represent a degree of underlying adenopathy. Postoperative changes noted in each axillary region with evidence of bilateral mastectomies. No bone lesions. IMPRESSION: Widespread airspace opacity bilaterally. Suspect widespread pneumonia. It should be noted that underlying lymphangitic spread of tumor may be present. Both entities may be present concurrently. There is apparent persistent scarring with volume loss left upper lobe region. Soft tissue prominence in this area is stable and  likely due to scarring; a degree of superimposed adenopathy in this area cannot be excluded by radiography. Port-A-Cath tip is in the right atrium slightly beyond the cavoatrial junction. Bilateral mastectomies noted. Electronically Signed   By: Lowella Grip III M.D.   On: 09/08/2019 15:11      Subjective: Still feels short of breath.  Has a nonproductive cough.  Continues to complain of worsening lower extremity edema.  Discharge Exam: Vitals:   09/09/19 1442 09/09/19 1607 09/09/19 1611 09/09/19 1832  BP: 133/70     Pulse: (!) 129 (!) 118 (!) 117   Resp: (!) 31 (!) 25 (!) 22   Temp: 98.8 F (37.1 C)     TempSrc: Oral     SpO2: 99% 95% 95% 90%  Weight:      Height:        General: Pt is alert, awake, not in acute distress Cardiovascular: Regular, tachycardic, S1/S2 +, no rubs, no gallops Respiratory: Coarse breath sounds bilaterally Abdominal: Soft, NT, ND, bowel sounds + Extremities: 2+ lower extremity edema.  There is bruising on her knees and lower legs bilaterally    The results of significant diagnostics from this hospitalization (including imaging, microbiology, ancillary and laboratory) are listed below for reference.     Microbiology: Recent Results (from the past 240 hour(s))  Respiratory Panel by RT PCR (Flu A&B, Covid) - Nasopharyngeal Swab     Status: None   Collection Time: 09/08/19  2:59 PM   Specimen: Nasopharyngeal Swab  Result Value Ref Range Status   SARS Coronavirus 2 by RT PCR NEGATIVE NEGATIVE Final    Comment: (NOTE) SARS-CoV-2 target nucleic acids are NOT DETECTED. The SARS-CoV-2 RNA is generally detectable in upper respiratoy specimens during the acute phase of infection. The lowest concentration of SARS-CoV-2 viral copies this assay can detect is 131 copies/mL. A negative result does not preclude SARS-Cov-2 infection and should not be used as the sole basis for treatment or other patient management decisions. A negative result may occur  with  improper specimen collection/handling, submission of specimen other than nasopharyngeal swab, presence of viral mutation(s) within the areas targeted by this assay, and inadequate number of viral copies (<131 copies/mL). A negative result must be combined with clinical observations, patient history, and epidemiological information. The expected result is Negative. Fact Sheet for Patients:  PinkCheek.be Fact Sheet for Healthcare Providers:  GravelBags.it This test is not yet ap proved or cleared by the [place] FDA and  has been authorized for detection and/or diagnosis of SARS-CoV-2 by FDA under an Emergency Use Authorization (EUA). This EUA will remain  in effect (meaning this test can be used) for the duration of the COVID-19 declaration under Section 564(b)(1) of the Act, 21 U.S.C. section 360bbb-3(b)(1), unless the authorization is terminated  or revoked sooner.    Influenza A by PCR NEGATIVE NEGATIVE Final   Influenza B by PCR NEGATIVE NEGATIVE Final    Comment: (NOTE) The Xpert Xpress SARS-CoV-2/FLU/RSV assay is intended as an aid in  the diagnosis of influenza from Nasopharyngeal swab specimens and  should not be used as a sole basis for treatment. Nasal washings and  aspirates are unacceptable for Xpert Xpress SARS-CoV-2/FLU/RSV  testing. Fact Sheet for Patients: PinkCheek.be Fact Sheet for Healthcare Providers: GravelBags.it This test is not yet approved or cleared by the [place] FDA and  has been authorized for detection and/or diagnosis of SARS-CoV-2 by  FDA under an Emergency Use Authorization (EUA). This EUA will remain  in effect (meaning this test can be used) for the duration of the  Covid-19 declaration under Section 564(b)(1) of the Act, 21  U.S.C. section 360bbb-3(b)(1), unless the authorization is  terminated or revoked. Performed  at [facility], [place] [address], [place], [place] [address]   Blood culture (routine x 2)     Status: None (Preliminary result)   Collection Time: 09/08/19  8:49 PM   Specimen: BLOOD RIGHT WRIST  Result Value Ref Range Status   Specimen Description BLOOD RIGHT WRIST  Final   Special Requests   Final    BOTTLES DRAWN AEROBIC AND ANAEROBIC Blood Culture adequate volume   Culture   Final    NO GROWTH < 24 HOURS Performed at [facility], [place] [address], [place], [place] [address]    Report Status PENDING  Incomplete  Blood culture (routine x 2)     Status: None (Preliminary result)   Collection Time: 09/08/19  8:54 PM   Specimen: BLOOD RIGHT ARM  Result Value Ref Range Status   Specimen Description BLOOD RIGHT ARM  Final   Special Requests   Final    BOTTLES DRAWN AEROBIC AND ANAEROBIC Blood Culture adequate volume   Culture   Final    NO GROWTH < 24 HOURS Performed at [facility], [place] [address], [place], [place] [address]    Report Status PENDING  Incomplete     Labs: BNP (last 3 results) Recent Labs    09/08/19 1443  BNP [id number]*   Basic Metabolic Panel: Recent Labs  Lab 09/08/19 1410 09/09/19 0256  NA 133* 137  K 3.5 3.0*  CL 91* 93*  CO2 26 32  GLUCOSE 252* 164*  BUN 22 20  CREATININE 0.91 0.71  CALCIUM 8.4* 8.4*  MG  --  1.6*   Liver Function Tests: Recent Labs  Lab 09/08/19 1648  AST 41  ALT 48*  ALKPHOS 183*  BILITOT 2.4*  PROT 5.1*  ALBUMIN 2.4*   No results for input(s): LIPASE, AMYLASE in the last 168 hours. No results for input(s): AMMONIA in the last 168 hours. CBC: Recent Labs  Lab 09/08/19 1410 09/08/19 1443 09/09/19 0256  WBC 8.1 7.3 6.3  NEUTROABS  --  6.8  --   HGB 11.0* 11.1* 10.4*  HCT 33.8* 33.4* 31.9*  MCV 119.0* 118.9* 118.1*  PLT 137* 123* 128*   Cardiac Enzymes: No results for input(s): CKTOTAL, CKMB, CKMBINDEX, TROPONINI in the last 168 hours. BNP: Invalid input(s): POCBNP CBG: No results for  input(s): GLUCAP in the last 168 hours. D-Dimer No results for input(s): DDIMER in the last 72 hours. Hgb A1c No results for input(s): HGBA1C in the last 72 hours. Lipid Profile No results for input(s): CHOL, HDL, LDLCALC, TRIG, CHOLHDL, LDLDIRECT  in the last 72 hours. Thyroid function studies No results for input(s): TSH, T4TOTAL, T3FREE, THYROIDAB in the last 72 hours.  Invalid input(s): FREET3 Anemia work up No results for input(s): VITAMINB12, FOLATE, FERRITIN, TIBC, IRON, RETICCTPCT in the last 72 hours. Urinalysis    Component Value Date/Time   COLORURINE YELLOW 08/20/2018 1416   APPEARANCEUR CLEAR 08/20/2018 1416   LABSPEC 1.023 08/20/2018 1416   LABSPEC 1.005 02/17/2008 1122   PHURINE 5.0 08/20/2018 1416   GLUCOSEU NEGATIVE 08/20/2018 1416   HGBUR NEGATIVE 08/20/2018 1416   HGBUR trace-intact 11/22/2010 0835   BILIRUBINUR NEGATIVE 08/20/2018 1416   BILIRUBINUR negative 02/08/2018 1021   BILIRUBINUR Negative 02/17/2008 1122   KETONESUR NEGATIVE 08/20/2018 1416   PROTEINUR NEGATIVE 08/20/2018 1416   UROBILINOGEN 0.2 02/08/2018 1021   UROBILINOGEN 1.0 02/01/2015 1654   NITRITE NEGATIVE 08/20/2018 1416   LEUKOCYTESUR NEGATIVE 08/20/2018 1416   LEUKOCYTESUR Moderate 02/17/2008 1122   Sepsis Labs Invalid input(s): PROCALCITONIN,  WBC,  LACTICIDVEN Microbiology Recent Results (from the past 240 hour(s))  Respiratory Panel by RT PCR (Flu A&B, Covid) - Nasopharyngeal Swab     Status: None   Collection Time: 09/08/19  2:59 PM   Specimen: Nasopharyngeal Swab  Result Value Ref Range Status   SARS Coronavirus 2 by RT PCR NEGATIVE NEGATIVE Final    Comment: (NOTE) SARS-CoV-2 target nucleic acids are NOT DETECTED. The SARS-CoV-2 RNA is generally detectable in upper respiratoy specimens during the acute phase of infection. The lowest concentration of SARS-CoV-2 viral copies this assay can detect is 131 copies/mL. A negative result does not preclude SARS-Cov-2 infection and  should not be used as the sole basis for treatment or other patient management decisions. A negative result may occur with  improper specimen collection/handling, submission of specimen other than nasopharyngeal swab, presence of viral mutation(s) within the areas targeted by this assay, and inadequate number of viral copies (<131 copies/mL). A negative result must be combined with clinical observations, patient history, and epidemiological information. The expected result is Negative. Fact Sheet for Patients:  PinkCheek.be Fact Sheet for Healthcare Providers:  GravelBags.it This test is not yet ap proved or cleared by the [place] FDA and  has been authorized for detection and/or diagnosis of SARS-CoV-2 by FDA under an Emergency Use Authorization (EUA). This EUA will remain  in effect (meaning this test can be used) for the duration of the COVID-19 declaration under Section 564(b)(1) of the Act, 21 U.S.C. section 360bbb-3(b)(1), unless the authorization is terminated or revoked sooner.    Influenza A by PCR NEGATIVE NEGATIVE Final   Influenza B by PCR NEGATIVE NEGATIVE Final    Comment: (NOTE) The Xpert Xpress SARS-CoV-2/FLU/RSV assay is intended as an aid in  the diagnosis of influenza from Nasopharyngeal swab specimens and  should not be used as a sole basis for treatment. Nasal washings and  aspirates are unacceptable for Xpert Xpress SARS-CoV-2/FLU/RSV  testing. Fact Sheet for Patients: PinkCheek.be Fact Sheet for Healthcare Providers: GravelBags.it This test is not yet approved or cleared by the [place] FDA and  has been authorized for detection and/or diagnosis of SARS-CoV-2 by  FDA under an Emergency Use Authorization (EUA). This EUA will remain  in effect (meaning this test can be used) for the duration of the  Covid-19 declaration under Section  564(b)(1) of the Act, 21  U.S.C. section 360bbb-3(b)(1), unless the authorization is  terminated or revoked. Performed at [facility], [place] [address], [place], [place] [address]  Blood culture (routine x 2)     Status: None (Preliminary result)   Collection Time: 09/08/19  8:49 PM   Specimen: BLOOD RIGHT WRIST  Result Value Ref Range Status   Specimen Description BLOOD RIGHT WRIST  Final   Special Requests   Final    BOTTLES DRAWN AEROBIC AND ANAEROBIC Blood Culture adequate volume   Culture   Final    NO GROWTH < 24 HOURS Performed at [facility], [place] [address], [place], [place] [address]    Report Status PENDING  Incomplete  Blood culture (routine x 2)     Status: None (Preliminary result)   Collection Time: 09/08/19  8:54 PM   Specimen: BLOOD RIGHT ARM  Result Value Ref Range Status   Specimen Description BLOOD RIGHT ARM  Final   Special Requests   Final    BOTTLES DRAWN AEROBIC AND ANAEROBIC Blood Culture adequate volume   Culture   Final    NO GROWTH < 24 HOURS Performed at [facility], [place] [address], [place], [place] [address]    Report Status PENDING  Incomplete     Time coordinating discharge: 66mins  SIGNED:   Kathie Dike, MD  [facility] 09/09/2019, 6:56 PM   If 7PM-7AM, please contact night-coverage [email or web address]

## 2019-09-09 NOTE — ED Notes (Signed)
Supplemental o2 delivery changed to HF Waterproof w/ humidified air; patient stated much better than non-rebreather. Stated she also had albuterol treatment last night which helped also. Will check PRN orders.

## 2019-09-09 NOTE — ED Notes (Signed)
Lunch Tray Ordered @ 1047. 

## 2019-09-09 NOTE — Progress Notes (Signed)
Pharmacy Antibiotic Note  [name] is a 64 y.o. female admitted on 09/08/2019 with pneumonia.  Pharmacy has been consulted for vancomycin and cefepime dosing. Patient recently had prolonged admission at [facility] for mgmt of pulmonary hypertension and was discharged on cefdinir.   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  Pt rec'd one dose of cefipime (2 gm) and vancomycin (1750 mg) yesterday evening; vancomycin dose was administered at 2207 last evening. Then, antibiotics were discontinued. Due to pt's immunocompromised status and recent hospitalization, pt is being restarted on antibiotics. Pharmacy is consulted to dose vancomycin and cefepime for pneumonia.  WBC 6.3, afebrile; Scr 0.71, CrCl 81.4 ml/min (renal function stable)  Plan: Vancomycin 1 gm IV Q 12 hrs (estimated vancomycin AUC on this regimen, using Scr 0.80, is 513.2; goal vancomycin AUC is 400-550) Cefepime 2 g IV Q 8 hrs Monitor WBC, temp, clinical improvement, cultures, renal function, steady-state vancomycin levels as indicated Monitor renal function, cultures/sensitivities, clinical progression Check vancomycin levels as indicated  Height: 5\' 7"  (170.2 cm) Weight: 86.6 kg (190 lb 14.7 oz) IBW/kg (Calculated) : 61.6  Temp (24hrs), Avg:98.3 F (36.8 C), Min:97.7 F (36.5 C), Max:98.8 F (37.1 C)  Recent Labs  Lab 09/08/19 1410 09/08/19 1443 09/08/19 2004 09/09/19 0256  WBC 8.1 7.3  --  6.3  CREATININE 0.91  --   --  0.71  LATICACIDVEN  --  3.4* 2.0*  --     Estimated Creatinine Clearance: 81.4 mL/min (by C-G formula based on SCr of 0.71 mg/dL).    Allergies  Allergen Reactions  . Penicillins Hives, Itching, Rash and Other (See Comments)    Patient has previously tolerated cefazolin, ceftriaxone, and cefepime  PATIENT HAS HAD A PCN REACTION WITH IMMEDIATE  RASH, FACIAL/TONGUE/THROAT SWELLING, SOB, OR LIGHTHEADEDNESS WITH HYPOTENSION:  #  #  YES  #  #  Has patient had a PCN reaction causing severe rash involving mucus membranes or skin necrosis: No Has patient had a PCN reaction that required hospitalization No Has patient had a PCN reaction occurring within the last 10 years: yes Denies airway involvement   . Tape Hives and Other (See Comments)    Can tolerate paper and adhesive,  NO MEDIPORE TAPE  . Other Rash and Other (See Comments)    STERI STRIPS - Blisters    Antimicrobials this admission: Vancomycin 4/15 >>  Cefepime 4/15 >>   Microbiology results: 4/15 BCx 2: NGTD 4/15 flu A, flu B, COVID: negative 4/16 HIV: negative  Gillermina Hu, PharmD, BCPS, [facility] Clinical Pharmacist 09/09/2019  5:08 PM

## 2019-09-09 NOTE — ED Notes (Signed)
MD Memon paged again r/t pt request for neb  Tx, with tachycardia and soft B/P. Plan to hold metoprolol until discussion with MD.

## 2019-09-10 MED ORDER — ENOXAPARIN SODIUM 40 MG/0.4ML ~~LOC~~ SOLN
40.00 | SUBCUTANEOUS | Status: DC
Start: 2019-09-20 — End: 2019-09-10

## 2019-09-10 MED ORDER — PHENOL EX
80.00 | CUTANEOUS | Status: DC
Start: 2019-09-10 — End: 2019-09-10

## 2019-09-13 LAB — CULTURE, BLOOD (ROUTINE X 2)
Culture: NO GROWTH
Culture: NO GROWTH
Special Requests: ADEQUATE
Special Requests: ADEQUATE

## 2019-09-14 ENCOUNTER — Ambulatory Visit: Payer: Self-pay

## 2019-09-14 MED ORDER — LORATADINE 10 MG PO TABS
10.00 | ORAL_TABLET | ORAL | Status: DC
Start: 2019-09-20 — End: 2019-09-14

## 2019-09-14 MED ORDER — OXYCODONE HCL 5 MG/5ML PO SOLN
5.00 | ORAL | Status: DC
Start: ? — End: 2019-09-14

## 2019-09-14 MED ORDER — ACETAMINOPHEN 325 MG PO TABS
650.00 | ORAL_TABLET | ORAL | Status: DC
Start: ? — End: 2019-09-14

## 2019-09-14 MED ORDER — GENERIC EXTERNAL MEDICATION
Status: DC
Start: ? — End: 2019-09-14

## 2019-09-14 MED ORDER — ONDANSETRON HCL 8 MG PO TABS
8.00 | ORAL_TABLET | ORAL | Status: DC
Start: ? — End: 2019-09-14

## 2019-09-14 MED ORDER — POLYETHYLENE GLYCOL 3350 17 GM/SCOOP PO POWD
17.00 | ORAL | Status: DC
Start: 2019-09-19 — End: 2019-09-14

## 2019-09-14 MED ORDER — SULFAMETHOXAZOLE-TRIMETHOPRIM 200-40 MG/5ML PO SUSP
40.00 | ORAL | Status: DC
Start: 2019-09-19 — End: 2019-09-14

## 2019-09-14 MED ORDER — BISACODYL 10 MG/30ML RE ENEM
10.00 | ENEMA | RECTAL | Status: DC
Start: ? — End: 2019-09-14

## 2019-09-14 MED ORDER — GLUCAGON (RDNA) 1 MG IJ KIT
1.00 | PACK | INTRAMUSCULAR | Status: DC
Start: ? — End: 2019-09-14

## 2019-09-14 MED ORDER — STRI-DEX MAXIMUM STRENGTH 2 % EX PADS
50.00 | MEDICATED_PAD | CUTANEOUS | Status: DC
Start: ? — End: 2019-09-14

## 2019-09-14 MED ORDER — SILDENAFIL CITRATE 20 MG PO TABS
40.00 | ORAL_TABLET | ORAL | Status: DC
Start: 2019-09-14 — End: 2019-09-14

## 2019-09-14 MED ORDER — WH PETROL-MINERAL OIL-LANOLIN 0.1-0.1 % OP OINT
TOPICAL_OINTMENT | OPHTHALMIC | Status: DC
Start: ? — End: 2019-09-14

## 2019-09-14 MED ORDER — GENERIC EXTERNAL MEDICATION
1.00 | Status: DC
Start: 2019-09-14 — End: 2019-09-14

## 2019-09-14 MED ORDER — SENNOSIDES-DOCUSATE SODIUM 8.6-50 MG PO TABS
2.00 | ORAL_TABLET | ORAL | Status: DC
Start: 2019-09-19 — End: 2019-09-14

## 2019-09-14 MED ORDER — CHOLECALCIFEROL 50 MCG (2000 UT) PO TABS
2000.00 | ORAL_TABLET | ORAL | Status: DC
Start: 2019-09-21 — End: 2019-09-14

## 2019-09-14 MED ORDER — GENERIC EXTERNAL MEDICATION
20.00 | Status: DC
Start: 2019-09-20 — End: 2019-09-14

## 2019-09-14 MED ORDER — PREDNISONE 20 MG PO TABS
40.00 | ORAL_TABLET | ORAL | Status: DC
Start: 2019-09-14 — End: 2019-09-14

## 2019-09-14 MED ORDER — BISACODYL 10 MG RE SUPP
10.00 | RECTAL | Status: DC
Start: ? — End: 2019-09-14

## 2019-09-14 MED ORDER — GABAPENTIN 300 MG PO CAPS
300.00 | ORAL_CAPSULE | ORAL | Status: DC
Start: 2019-09-19 — End: 2019-09-14

## 2019-09-14 MED ORDER — BENICAR 20 MG PO TABS
12.50 | ORAL_TABLET | ORAL | Status: DC
Start: ? — End: 2019-09-14

## 2019-09-14 MED ORDER — CHLORHEXIDINE GLUCONATE 0.12 % MT SOLN
5.00 | OROMUCOSAL | Status: DC
Start: 2019-09-19 — End: 2019-09-14

## 2019-09-14 MED ORDER — OFLOXACIN 0.3 % OP SOLN
1.00 | OPHTHALMIC | Status: DC
Start: 2019-09-19 — End: 2019-09-14

## 2019-09-14 MED ORDER — PREDNISONE 20 MG PO TABS
40.00 | ORAL_TABLET | ORAL | Status: DC
Start: 2019-09-15 — End: 2019-09-14

## 2019-09-19 MED ORDER — NOREPINEPHRINE-SODIUM CHLORIDE 16-0.9 MG/250ML-% IV SOLN
0.01 | INTRAVENOUS | Status: DC
Start: ? — End: 2019-09-19

## 2019-09-19 MED ORDER — INSULIN REGULAR HUMAN 100 UNIT/ML IJ SOLN
0.00 | INTRAMUSCULAR | Status: DC
Start: 2019-09-19 — End: 2019-09-19

## 2019-09-19 MED ORDER — MELATONIN 3 MG PO TABS
3.00 | ORAL_TABLET | ORAL | Status: DC
Start: 2019-09-19 — End: 2019-09-19

## 2019-09-19 MED ORDER — WH PETROL-MINERAL OIL-LANOLIN 0.1-0.1 % OP OINT
1.00 | TOPICAL_OINTMENT | OPHTHALMIC | Status: DC
Start: 2019-09-19 — End: 2019-09-19

## 2019-09-19 MED ORDER — GENERIC EXTERNAL MEDICATION
Status: DC
Start: ? — End: 2019-09-19

## 2019-09-19 MED ORDER — PROPOFOL 100 MG/10ML IV EMUL
5.00 | INTRAVENOUS | Status: DC
Start: ? — End: 2019-09-19

## 2019-09-19 MED ORDER — GENERIC EXTERNAL MEDICATION
0.25 | Status: DC
Start: ? — End: 2019-09-19

## 2019-09-19 MED ORDER — SIMETHICONE 80 MG PO CHEW
80.00 | CHEWABLE_TABLET | ORAL | Status: DC
Start: ? — End: 2019-09-19

## 2019-09-19 MED ORDER — METHYLPREDNISOLONE SODIUM SUCC 125 MG IJ SOLR
40.00 | INTRAMUSCULAR | Status: DC
Start: 2019-09-19 — End: 2019-09-19

## 2019-09-19 MED ORDER — PREDNISOLONE ACETATE 1 % OP SUSP
1.00 | OPHTHALMIC | Status: DC
Start: 2019-09-20 — End: 2019-09-19

## 2019-09-19 MED ORDER — ILOPROST 10 MCG/ML IN SOLN
10.00 | RESPIRATORY_TRACT | Status: DC
Start: 2019-09-19 — End: 2019-09-19

## 2019-09-24 DEATH — deceased

## 2019-10-13 ENCOUNTER — Encounter (HOSPITAL_COMMUNITY): Payer: Medicare Other | Admitting: Internal Medicine

## 2019-10-13 ENCOUNTER — Other Ambulatory Visit (HOSPITAL_COMMUNITY): Payer: Medicare Other
